# Patient Record
Sex: Male | Born: 1950
Health system: Southern US, Community
[De-identification: ages and names within clinical notes are randomized; demographics above are authoritative.]

## PROBLEM LIST (undated history)

## (undated) DIAGNOSIS — K922 Gastrointestinal hemorrhage, unspecified: Secondary | ICD-10-CM

## (undated) DIAGNOSIS — N185 Chronic kidney disease, stage 5: Secondary | ICD-10-CM

## (undated) DIAGNOSIS — I951 Orthostatic hypotension: Secondary | ICD-10-CM

## (undated) DIAGNOSIS — E119 Type 2 diabetes mellitus without complications: Secondary | ICD-10-CM

## (undated) DIAGNOSIS — Z9289 Personal history of other medical treatment: Secondary | ICD-10-CM

## (undated) DIAGNOSIS — N186 End stage renal disease: Secondary | ICD-10-CM

## (undated) DIAGNOSIS — I480 Paroxysmal atrial fibrillation: Secondary | ICD-10-CM

## (undated) HISTORY — DX: Type 2 diabetes mellitus without complications: E11.9

## (undated) HISTORY — PX: NOSE SURGERY: SHX723

---
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
## 2007-03-03 ENCOUNTER — Ambulatory Visit (HOSPITAL_COMMUNITY): Admission: RE | Admit: 2007-03-03 | Discharge: 2007-03-03 | Payer: Self-pay | Admitting: Family Medicine

## 2010-12-05 ENCOUNTER — Encounter: Payer: Self-pay | Admitting: Cardiology

## 2011-01-02 NOTE — Letter (Signed)
Summary: [facility] INTERNAL MEDICINE  - OFFICE NOTE  [facility] INTERNAL MEDICINE  - OFFICE NOTE   Imported By: Cher Nakai 12/27/2010 12:08:34  _____________________________________________________________________  External Attachment:    Type:   Image     Comment:   External Document

## 2011-01-31 ENCOUNTER — Ambulatory Visit: Payer: Self-pay | Admitting: Cardiology

## 2011-01-31 ENCOUNTER — Encounter: Payer: Self-pay | Admitting: *Deleted

## 2011-02-18 ENCOUNTER — Ambulatory Visit: Payer: Self-pay | Admitting: Cardiology

## 2011-03-17 ENCOUNTER — Ambulatory Visit: Payer: Self-pay | Admitting: Cardiology

## 2015-08-21 DIAGNOSIS — L03031 Cellulitis of right toe: Secondary | ICD-10-CM | POA: Diagnosis not present

## 2015-08-21 DIAGNOSIS — Z6826 Body mass index (BMI) 26.0-26.9, adult: Secondary | ICD-10-CM | POA: Diagnosis not present

## 2015-09-10 DIAGNOSIS — Z23 Encounter for immunization: Secondary | ICD-10-CM | POA: Diagnosis not present

## 2015-11-20 DIAGNOSIS — E1142 Type 2 diabetes mellitus with diabetic polyneuropathy: Secondary | ICD-10-CM | POA: Diagnosis not present

## 2015-11-20 DIAGNOSIS — I10 Essential (primary) hypertension: Secondary | ICD-10-CM | POA: Diagnosis not present

## 2015-11-20 DIAGNOSIS — Z6826 Body mass index (BMI) 26.0-26.9, adult: Secondary | ICD-10-CM | POA: Diagnosis not present

## 2015-11-21 DIAGNOSIS — H52223 Regular astigmatism, bilateral: Secondary | ICD-10-CM | POA: Diagnosis not present

## 2015-11-21 DIAGNOSIS — H5203 Hypermetropia, bilateral: Secondary | ICD-10-CM | POA: Diagnosis not present

## 2015-11-21 DIAGNOSIS — E119 Type 2 diabetes mellitus without complications: Secondary | ICD-10-CM | POA: Diagnosis not present

## 2015-11-21 DIAGNOSIS — Z7984 Long term (current) use of oral hypoglycemic drugs: Secondary | ICD-10-CM | POA: Diagnosis not present

## 2016-01-29 DIAGNOSIS — A5216 Charcot's arthropathy (tabetic): Secondary | ICD-10-CM | POA: Diagnosis not present

## 2016-01-29 DIAGNOSIS — M79671 Pain in right foot: Secondary | ICD-10-CM | POA: Diagnosis not present

## 2016-02-04 DIAGNOSIS — G6181 Chronic inflammatory demyelinating polyneuritis: Secondary | ICD-10-CM | POA: Diagnosis not present

## 2016-02-12 DIAGNOSIS — E1142 Type 2 diabetes mellitus with diabetic polyneuropathy: Secondary | ICD-10-CM | POA: Diagnosis not present

## 2016-02-12 DIAGNOSIS — I10 Essential (primary) hypertension: Secondary | ICD-10-CM | POA: Diagnosis not present

## 2016-02-12 DIAGNOSIS — Z6827 Body mass index (BMI) 27.0-27.9, adult: Secondary | ICD-10-CM | POA: Diagnosis not present

## 2016-03-03 DIAGNOSIS — E11610 Type 2 diabetes mellitus with diabetic neuropathic arthropathy: Secondary | ICD-10-CM | POA: Diagnosis not present

## 2016-03-03 DIAGNOSIS — M79671 Pain in right foot: Secondary | ICD-10-CM | POA: Diagnosis not present

## 2016-05-19 DIAGNOSIS — Z0000 Encounter for general adult medical examination without abnormal findings: Secondary | ICD-10-CM | POA: Diagnosis not present

## 2016-05-19 DIAGNOSIS — Z1211 Encounter for screening for malignant neoplasm of colon: Secondary | ICD-10-CM | POA: Diagnosis not present

## 2016-05-19 DIAGNOSIS — I10 Essential (primary) hypertension: Secondary | ICD-10-CM | POA: Diagnosis not present

## 2016-05-19 DIAGNOSIS — E784 Other hyperlipidemia: Secondary | ICD-10-CM | POA: Diagnosis not present

## 2016-05-19 DIAGNOSIS — E1142 Type 2 diabetes mellitus with diabetic polyneuropathy: Secondary | ICD-10-CM | POA: Diagnosis not present

## 2016-08-22 DIAGNOSIS — E784 Other hyperlipidemia: Secondary | ICD-10-CM | POA: Diagnosis not present

## 2016-08-22 DIAGNOSIS — E1142 Type 2 diabetes mellitus with diabetic polyneuropathy: Secondary | ICD-10-CM | POA: Diagnosis not present

## 2016-08-22 DIAGNOSIS — I10 Essential (primary) hypertension: Secondary | ICD-10-CM | POA: Diagnosis not present

## 2016-11-08 DIAGNOSIS — Z23 Encounter for immunization: Secondary | ICD-10-CM | POA: Diagnosis not present

## 2016-11-21 DIAGNOSIS — Z7984 Long term (current) use of oral hypoglycemic drugs: Secondary | ICD-10-CM | POA: Diagnosis not present

## 2016-11-21 DIAGNOSIS — H25813 Combined forms of age-related cataract, bilateral: Secondary | ICD-10-CM | POA: Diagnosis not present

## 2016-11-21 DIAGNOSIS — H5203 Hypermetropia, bilateral: Secondary | ICD-10-CM | POA: Diagnosis not present

## 2016-11-21 DIAGNOSIS — E119 Type 2 diabetes mellitus without complications: Secondary | ICD-10-CM | POA: Diagnosis not present

## 2016-11-28 DIAGNOSIS — E784 Other hyperlipidemia: Secondary | ICD-10-CM | POA: Diagnosis not present

## 2016-11-28 DIAGNOSIS — E1142 Type 2 diabetes mellitus with diabetic polyneuropathy: Secondary | ICD-10-CM | POA: Diagnosis not present

## 2016-11-28 DIAGNOSIS — I10 Essential (primary) hypertension: Secondary | ICD-10-CM | POA: Diagnosis not present

## 2016-11-28 DIAGNOSIS — Z125 Encounter for screening for malignant neoplasm of prostate: Secondary | ICD-10-CM | POA: Diagnosis not present

## 2017-03-02 DIAGNOSIS — I10 Essential (primary) hypertension: Secondary | ICD-10-CM | POA: Diagnosis not present

## 2017-03-02 DIAGNOSIS — N183 Chronic kidney disease, stage 3 (moderate): Secondary | ICD-10-CM | POA: Diagnosis not present

## 2017-03-02 DIAGNOSIS — E784 Other hyperlipidemia: Secondary | ICD-10-CM | POA: Diagnosis not present

## 2017-03-02 DIAGNOSIS — E1142 Type 2 diabetes mellitus with diabetic polyneuropathy: Secondary | ICD-10-CM | POA: Diagnosis not present

## 2017-03-25 DIAGNOSIS — G6181 Chronic inflammatory demyelinating polyneuritis: Secondary | ICD-10-CM | POA: Diagnosis not present

## 2017-04-28 DIAGNOSIS — A681 Tick-borne relapsing fever: Secondary | ICD-10-CM | POA: Diagnosis not present

## 2017-06-04 DIAGNOSIS — E119 Type 2 diabetes mellitus without complications: Secondary | ICD-10-CM | POA: Diagnosis not present

## 2017-06-04 DIAGNOSIS — E1142 Type 2 diabetes mellitus with diabetic polyneuropathy: Secondary | ICD-10-CM | POA: Diagnosis not present

## 2017-06-04 DIAGNOSIS — E784 Other hyperlipidemia: Secondary | ICD-10-CM | POA: Diagnosis not present

## 2017-06-04 DIAGNOSIS — I10 Essential (primary) hypertension: Secondary | ICD-10-CM | POA: Diagnosis not present

## 2017-09-15 DIAGNOSIS — E7849 Other hyperlipidemia: Secondary | ICD-10-CM | POA: Diagnosis not present

## 2017-09-15 DIAGNOSIS — Z0000 Encounter for general adult medical examination without abnormal findings: Secondary | ICD-10-CM | POA: Diagnosis not present

## 2017-09-15 DIAGNOSIS — I10 Essential (primary) hypertension: Secondary | ICD-10-CM | POA: Diagnosis not present

## 2017-09-15 DIAGNOSIS — E119 Type 2 diabetes mellitus without complications: Secondary | ICD-10-CM | POA: Diagnosis not present

## 2017-09-21 DIAGNOSIS — Z23 Encounter for immunization: Secondary | ICD-10-CM | POA: Diagnosis not present

## 2017-10-27 DIAGNOSIS — Z538 Procedure and treatment not carried out for other reasons: Secondary | ICD-10-CM | POA: Diagnosis not present

## 2017-10-27 DIAGNOSIS — E119 Type 2 diabetes mellitus without complications: Secondary | ICD-10-CM | POA: Diagnosis not present

## 2017-10-27 DIAGNOSIS — Z1211 Encounter for screening for malignant neoplasm of colon: Secondary | ICD-10-CM | POA: Diagnosis not present

## 2017-10-28 DIAGNOSIS — E119 Type 2 diabetes mellitus without complications: Secondary | ICD-10-CM | POA: Diagnosis not present

## 2017-10-28 DIAGNOSIS — Z1211 Encounter for screening for malignant neoplasm of colon: Secondary | ICD-10-CM | POA: Diagnosis not present

## 2017-11-28 DIAGNOSIS — E119 Type 2 diabetes mellitus without complications: Secondary | ICD-10-CM | POA: Diagnosis not present

## 2017-11-28 DIAGNOSIS — Z7984 Long term (current) use of oral hypoglycemic drugs: Secondary | ICD-10-CM | POA: Diagnosis not present

## 2017-11-28 DIAGNOSIS — H5203 Hypermetropia, bilateral: Secondary | ICD-10-CM | POA: Diagnosis not present

## 2017-11-28 DIAGNOSIS — H25813 Combined forms of age-related cataract, bilateral: Secondary | ICD-10-CM | POA: Diagnosis not present

## 2017-12-21 DIAGNOSIS — E7849 Other hyperlipidemia: Secondary | ICD-10-CM | POA: Diagnosis not present

## 2017-12-21 DIAGNOSIS — I10 Essential (primary) hypertension: Secondary | ICD-10-CM | POA: Diagnosis not present

## 2017-12-21 DIAGNOSIS — E1165 Type 2 diabetes mellitus with hyperglycemia: Secondary | ICD-10-CM | POA: Diagnosis not present

## 2018-04-06 DIAGNOSIS — I10 Essential (primary) hypertension: Secondary | ICD-10-CM | POA: Diagnosis not present

## 2018-04-06 DIAGNOSIS — E1165 Type 2 diabetes mellitus with hyperglycemia: Secondary | ICD-10-CM | POA: Diagnosis not present

## 2018-04-06 DIAGNOSIS — N182 Chronic kidney disease, stage 2 (mild): Secondary | ICD-10-CM | POA: Diagnosis not present

## 2018-04-06 DIAGNOSIS — E7849 Other hyperlipidemia: Secondary | ICD-10-CM | POA: Diagnosis not present

## 2018-04-06 DIAGNOSIS — Z125 Encounter for screening for malignant neoplasm of prostate: Secondary | ICD-10-CM | POA: Diagnosis not present

## 2018-04-17 DIAGNOSIS — Z79899 Other long term (current) drug therapy: Secondary | ICD-10-CM | POA: Diagnosis not present

## 2018-04-17 DIAGNOSIS — E119 Type 2 diabetes mellitus without complications: Secondary | ICD-10-CM | POA: Diagnosis not present

## 2018-04-17 DIAGNOSIS — S61412A Laceration without foreign body of left hand, initial encounter: Secondary | ICD-10-CM | POA: Diagnosis not present

## 2018-04-17 DIAGNOSIS — W260XXA Contact with knife, initial encounter: Secondary | ICD-10-CM | POA: Diagnosis not present

## 2018-04-17 DIAGNOSIS — Z7984 Long term (current) use of oral hypoglycemic drugs: Secondary | ICD-10-CM | POA: Diagnosis not present

## 2018-06-08 ENCOUNTER — Encounter (HOSPITAL_COMMUNITY)
Admission: EM | Disposition: A | Discharge status: Home Health | Payer: Self-pay | Source: Home / Self Care | Attending: Family Medicine

## 2018-06-08 ENCOUNTER — Inpatient Hospital Stay (HOSPITAL_COMMUNITY): Payer: Medicare Other

## 2018-06-08 ENCOUNTER — Encounter (HOSPITAL_COMMUNITY): Payer: Self-pay

## 2018-06-08 ENCOUNTER — Other Ambulatory Visit: Payer: Self-pay

## 2018-06-08 ENCOUNTER — Inpatient Hospital Stay (HOSPITAL_COMMUNITY): Payer: Medicare Other | Admitting: Certified Registered"

## 2018-06-08 ENCOUNTER — Inpatient Hospital Stay (HOSPITAL_COMMUNITY)
Admission: EM | Admit: 2018-06-08 | Discharge: 2018-06-15 | DRG: 493 | Disposition: A | Discharge status: Home Health | Payer: Medicare Other | Attending: Family Medicine | Admitting: Family Medicine

## 2018-06-08 ENCOUNTER — Emergency Department (HOSPITAL_COMMUNITY): Payer: Medicare Other

## 2018-06-08 DIAGNOSIS — E860 Dehydration: Secondary | ICD-10-CM | POA: Diagnosis not present

## 2018-06-08 DIAGNOSIS — N189 Chronic kidney disease, unspecified: Secondary | ICD-10-CM | POA: Diagnosis present

## 2018-06-08 DIAGNOSIS — I129 Hypertensive chronic kidney disease with stage 1 through stage 4 chronic kidney disease, or unspecified chronic kidney disease: Secondary | ICD-10-CM | POA: Diagnosis present

## 2018-06-08 DIAGNOSIS — M21371 Foot drop, right foot: Secondary | ICD-10-CM | POA: Diagnosis present

## 2018-06-08 DIAGNOSIS — E785 Hyperlipidemia, unspecified: Secondary | ICD-10-CM | POA: Diagnosis present

## 2018-06-08 DIAGNOSIS — T148XXA Other injury of unspecified body region, initial encounter: Secondary | ICD-10-CM

## 2018-06-08 DIAGNOSIS — G6181 Chronic inflammatory demyelinating polyneuritis: Secondary | ICD-10-CM | POA: Diagnosis present

## 2018-06-08 DIAGNOSIS — K56600 Partial intestinal obstruction, unspecified as to cause: Secondary | ICD-10-CM | POA: Diagnosis not present

## 2018-06-08 DIAGNOSIS — S8291XB Unspecified fracture of right lower leg, initial encounter for open fracture type I or II: Secondary | ICD-10-CM | POA: Diagnosis not present

## 2018-06-08 DIAGNOSIS — K56609 Unspecified intestinal obstruction, unspecified as to partial versus complete obstruction: Secondary | ICD-10-CM

## 2018-06-08 DIAGNOSIS — E872 Acidosis: Secondary | ICD-10-CM | POA: Diagnosis present

## 2018-06-08 DIAGNOSIS — E119 Type 2 diabetes mellitus without complications: Secondary | ICD-10-CM

## 2018-06-08 DIAGNOSIS — D62 Acute posthemorrhagic anemia: Secondary | ICD-10-CM | POA: Diagnosis not present

## 2018-06-08 DIAGNOSIS — S8411XS Injury of peroneal nerve at lower leg level, right leg, sequela: Secondary | ICD-10-CM

## 2018-06-08 DIAGNOSIS — Y92008 Other place in unspecified non-institutional (private) residence as the place of occurrence of the external cause: Secondary | ICD-10-CM | POA: Diagnosis not present

## 2018-06-08 DIAGNOSIS — K5900 Constipation, unspecified: Secondary | ICD-10-CM | POA: Diagnosis not present

## 2018-06-08 DIAGNOSIS — D631 Anemia in chronic kidney disease: Secondary | ICD-10-CM | POA: Diagnosis present

## 2018-06-08 DIAGNOSIS — Z79899 Other long term (current) drug therapy: Secondary | ICD-10-CM

## 2018-06-08 DIAGNOSIS — Y9389 Activity, other specified: Secondary | ICD-10-CM | POA: Diagnosis not present

## 2018-06-08 DIAGNOSIS — E875 Hyperkalemia: Secondary | ICD-10-CM | POA: Diagnosis not present

## 2018-06-08 DIAGNOSIS — S8251XC Displaced fracture of medial malleolus of right tibia, initial encounter for open fracture type IIIA, IIIB, or IIIC: Secondary | ICD-10-CM | POA: Diagnosis not present

## 2018-06-08 DIAGNOSIS — S82891B Other fracture of right lower leg, initial encounter for open fracture type I or II: Secondary | ICD-10-CM | POA: Diagnosis present

## 2018-06-08 DIAGNOSIS — S82841C Displaced bimalleolar fracture of right lower leg, initial encounter for open fracture type IIIA, IIIB, or IIIC: Principal | ICD-10-CM | POA: Diagnosis present

## 2018-06-08 DIAGNOSIS — S82391A Other fracture of lower end of right tibia, initial encounter for closed fracture: Secondary | ICD-10-CM | POA: Diagnosis not present

## 2018-06-08 DIAGNOSIS — M25571 Pain in right ankle and joints of right foot: Secondary | ICD-10-CM | POA: Diagnosis not present

## 2018-06-08 DIAGNOSIS — Z7984 Long term (current) use of oral hypoglycemic drugs: Secondary | ICD-10-CM

## 2018-06-08 DIAGNOSIS — Z419 Encounter for procedure for purposes other than remedying health state, unspecified: Secondary | ICD-10-CM

## 2018-06-08 DIAGNOSIS — N179 Acute kidney failure, unspecified: Secondary | ICD-10-CM | POA: Diagnosis not present

## 2018-06-08 DIAGNOSIS — S8253XC Displaced fracture of medial malleolus of unspecified tibia, initial encounter for open fracture type IIIA, IIIB, or IIIC: Secondary | ICD-10-CM | POA: Diagnosis present

## 2018-06-08 DIAGNOSIS — S93431A Sprain of tibiofibular ligament of right ankle, initial encounter: Secondary | ICD-10-CM

## 2018-06-08 DIAGNOSIS — S82843B Displaced bimalleolar fracture of unspecified lower leg, initial encounter for open fracture type I or II: Secondary | ICD-10-CM

## 2018-06-08 DIAGNOSIS — S82451A Displaced comminuted fracture of shaft of right fibula, initial encounter for closed fracture: Secondary | ICD-10-CM | POA: Diagnosis present

## 2018-06-08 DIAGNOSIS — W11XXXA Fall on and from ladder, initial encounter: Secondary | ICD-10-CM | POA: Diagnosis present

## 2018-06-08 DIAGNOSIS — I10 Essential (primary) hypertension: Secondary | ICD-10-CM | POA: Diagnosis not present

## 2018-06-08 DIAGNOSIS — R1110 Vomiting, unspecified: Secondary | ICD-10-CM

## 2018-06-08 DIAGNOSIS — K5669 Other partial intestinal obstruction: Secondary | ICD-10-CM | POA: Diagnosis not present

## 2018-06-08 DIAGNOSIS — S82831A Other fracture of upper and lower end of right fibula, initial encounter for closed fracture: Secondary | ICD-10-CM | POA: Diagnosis not present

## 2018-06-08 DIAGNOSIS — S82891C Other fracture of right lower leg, initial encounter for open fracture type IIIA, IIIB, or IIIC: Secondary | ICD-10-CM | POA: Diagnosis not present

## 2018-06-08 DIAGNOSIS — S82391B Other fracture of lower end of right tibia, initial encounter for open fracture type I or II: Secondary | ICD-10-CM | POA: Diagnosis not present

## 2018-06-08 DIAGNOSIS — S8251XB Displaced fracture of medial malleolus of right tibia, initial encounter for open fracture type I or II: Secondary | ICD-10-CM | POA: Diagnosis not present

## 2018-06-08 DIAGNOSIS — S82899A Other fracture of unspecified lower leg, initial encounter for closed fracture: Secondary | ICD-10-CM | POA: Diagnosis present

## 2018-06-08 DIAGNOSIS — Z978 Presence of other specified devices: Secondary | ICD-10-CM

## 2018-06-08 DIAGNOSIS — E1122 Type 2 diabetes mellitus with diabetic chronic kidney disease: Secondary | ICD-10-CM | POA: Diagnosis present

## 2018-06-08 DIAGNOSIS — G629 Polyneuropathy, unspecified: Secondary | ICD-10-CM

## 2018-06-08 DIAGNOSIS — Z4682 Encounter for fitting and adjustment of non-vascular catheter: Secondary | ICD-10-CM | POA: Diagnosis not present

## 2018-06-08 HISTORY — DX: Type 2 diabetes mellitus without complications: E11.9

## 2018-06-08 HISTORY — PX: I & D EXTREMITY: SHX5045

## 2018-06-08 HISTORY — PX: TIBIA DEBRIDEMENT: SHX2514

## 2018-06-08 HISTORY — PX: ORIF ANKLE FRACTURE: SHX5408

## 2018-06-08 HISTORY — PX: EXTERNAL FIXATION LEG: SHX1549

## 2018-06-08 LAB — CBC
HCT: 28.8 % — ABNORMAL LOW (ref 39.0–52.0)
Hemoglobin: 9 g/dL — ABNORMAL LOW (ref 13.0–17.0)
MCH: 30.8 pg (ref 26.0–34.0)
MCHC: 31.3 g/dL (ref 30.0–36.0)
MCV: 98.6 fL (ref 78.0–100.0)
Platelets: 195 10*3/uL (ref 150–400)
RBC: 2.92 MIL/uL — ABNORMAL LOW (ref 4.22–5.81)
RDW: 12.7 % (ref 11.5–15.5)
WBC: 5 10*3/uL (ref 4.0–10.5)

## 2018-06-08 LAB — COMPREHENSIVE METABOLIC PANEL
ALT: 18 U/L (ref 0–44)
AST: 21 U/L (ref 15–41)
Albumin: 3.6 g/dL (ref 3.5–5.0)
Alkaline Phosphatase: 54 U/L (ref 38–126)
Anion gap: 12 (ref 5–15)
BUN: 51 mg/dL — ABNORMAL HIGH (ref 8–23)
CO2: 17 mmol/L — ABNORMAL LOW (ref 22–32)
Calcium: 8.2 mg/dL — ABNORMAL LOW (ref 8.9–10.3)
Chloride: 110 mmol/L (ref 98–111)
Creatinine, Ser: 4.44 mg/dL — ABNORMAL HIGH (ref 0.61–1.24)
GFR calc Af Amer: 14 mL/min — ABNORMAL LOW (ref >60)
GFR calc non Af Amer: 13 mL/min — ABNORMAL LOW (ref >60)
Glucose, Bld: 111 mg/dL — ABNORMAL HIGH (ref 70–99)
Potassium: 4.7 mmol/L (ref 3.5–5.1)
Sodium: 139 mmol/L (ref 135–145)
Total Bilirubin: 0.6 mg/dL (ref 0.3–1.2)
Total Protein: 6 g/dL — ABNORMAL LOW (ref 6.5–8.1)

## 2018-06-08 LAB — I-STAT CG4 LACTIC ACID, ED: Lactic Acid, Venous: 2.5 mmol/L (ref 0.5–1.9)

## 2018-06-08 LAB — I-STAT CHEM 8, ED
BUN: 49 mg/dL — ABNORMAL HIGH (ref 8–23)
Calcium, Ion: 1.07 mmol/L — ABNORMAL LOW (ref 1.15–1.40)
Chloride: 108 mmol/L (ref 98–111)
Creatinine, Ser: 4.6 mg/dL — ABNORMAL HIGH (ref 0.61–1.24)
Glucose, Bld: 112 mg/dL — ABNORMAL HIGH (ref 70–99)
HCT: 27 % — ABNORMAL LOW (ref 39.0–52.0)
Hemoglobin: 9.2 g/dL — ABNORMAL LOW (ref 13.0–17.0)
Potassium: 4.6 mmol/L (ref 3.5–5.1)
Sodium: 140 mmol/L (ref 135–145)
TCO2: 18 mmol/L — ABNORMAL LOW (ref 22–32)

## 2018-06-08 LAB — URINALYSIS, ROUTINE W REFLEX MICROSCOPIC
Bilirubin Urine: NEGATIVE
Glucose, UA: NEGATIVE mg/dL
Ketones, ur: NEGATIVE mg/dL
Nitrite: NEGATIVE
Protein, ur: NEGATIVE mg/dL
Specific Gravity, Urine: 1.01 (ref 1.005–1.030)
pH: 5 (ref 5.0–8.0)

## 2018-06-08 LAB — CDS SEROLOGY

## 2018-06-08 LAB — PROTIME-INR
INR: 1.01
Prothrombin Time: 13.3 seconds (ref 11.4–15.2)

## 2018-06-08 LAB — SAMPLE TO BLOOD BANK

## 2018-06-08 LAB — ETHANOL: Alcohol, Ethyl (B): <10 mg/dL (ref <10)

## 2018-06-08 LAB — GLUCOSE, CAPILLARY
Glucose-Capillary: 97 mg/dL (ref 70–99)
Glucose-Capillary: 132 mg/dL — ABNORMAL HIGH (ref 70–99)

## 2018-06-08 LAB — CK: Total CK: 356 U/L (ref 49–397)

## 2018-06-08 LAB — LACTIC ACID, PLASMA: Lactic Acid, Venous: 1.4 mmol/L (ref 0.5–1.9)

## 2018-06-08 SURGERY — IRRIGATION AND DEBRIDEMENT EXTREMITY
Anesthesia: General | Site: Ankle | Laterality: Right

## 2018-06-08 MED ORDER — ACETAMINOPHEN 325 MG PO TABS
650.0000 mg | ORAL_TABLET | Freq: Four times a day (QID) | ORAL | Status: DC | PRN
  Administered 2018-06-09 – 2018-06-15 (×5): 650 mg via ORAL
  Filled 2018-06-08 (×5): qty 2

## 2018-06-08 MED ORDER — CEFAZOLIN SODIUM-DEXTROSE 2-4 GM/100ML-% IV SOLN
INTRAVENOUS | Status: AC
  Filled 2018-06-08: qty 100

## 2018-06-08 MED ORDER — SODIUM CHLORIDE 0.9 % IV SOLN
INTRAVENOUS | Status: DC
  Administered 2018-06-08 – 2018-06-10 (×3): via INTRAVENOUS

## 2018-06-08 MED ORDER — HYDROMORPHONE HCL 1 MG/ML IJ SOLN
INTRAMUSCULAR | Status: AC
  Administered 2018-06-08: 1 mg
  Filled 2018-06-08: qty 1

## 2018-06-08 MED ORDER — POLYETHYLENE GLYCOL 3350 17 G PO PACK: 17.0000 g | PACK | Freq: Every day | ORAL | Status: DC | PRN

## 2018-06-08 MED ORDER — METHOCARBAMOL 1000 MG/10ML IJ SOLN
500.0000 mg | Freq: Four times a day (QID) | INTRAVENOUS | Status: DC | PRN
  Filled 2018-06-08: qty 5

## 2018-06-08 MED ORDER — METOCLOPRAMIDE HCL 5 MG/ML IJ SOLN
5.0000 mg | Freq: Three times a day (TID) | INTRAMUSCULAR | Status: DC | PRN
  Administered 2018-06-12: 10 mg via INTRAVENOUS
  Filled 2018-06-08: qty 2

## 2018-06-08 MED ORDER — OXYCODONE HCL 5 MG/5ML PO SOLN: 5.0000 mg | Freq: Once | ORAL | Status: DC | PRN

## 2018-06-08 MED ORDER — DEXAMETHASONE SODIUM PHOSPHATE 10 MG/ML IJ SOLN
INTRAMUSCULAR | Status: DC | PRN
  Administered 2018-06-08: 10 mg via INTRAVENOUS

## 2018-06-08 MED ORDER — LIDOCAINE 2% (20 MG/ML) 5 ML SYRINGE
INTRAMUSCULAR | Status: AC
  Filled 2018-06-08: qty 5

## 2018-06-08 MED ORDER — MIDAZOLAM HCL 2 MG/2ML IJ SOLN
INTRAMUSCULAR | Status: DC | PRN
  Administered 2018-06-08: 2 mg via INTRAVENOUS

## 2018-06-08 MED ORDER — SODIUM CHLORIDE 0.9 % IV BOLUS
1000.0000 mL | Freq: Once | INTRAVENOUS | Status: AC
  Administered 2018-06-08: 1000 mL via INTRAVENOUS

## 2018-06-08 MED ORDER — ONDANSETRON HCL 4 MG/2ML IJ SOLN: 4.0000 mg | Freq: Once | INTRAMUSCULAR | Status: DC | PRN

## 2018-06-08 MED ORDER — SODIUM CHLORIDE 0.9 % IR SOLN
Status: DC | PRN
  Administered 2018-06-08: 3000 mL

## 2018-06-08 MED ORDER — ONDANSETRON HCL 4 MG/2ML IJ SOLN: 4.0000 mg | Freq: Four times a day (QID) | INTRAMUSCULAR | Status: DC | PRN

## 2018-06-08 MED ORDER — CALCIUM CARBONATE ANTACID 500 MG PO CHEW
400.0000 mg | CHEWABLE_TABLET | Freq: Two times a day (BID) | ORAL | Status: DC
  Administered 2018-06-08 – 2018-06-12 (×7): 400 mg via ORAL
  Administered 2018-06-12: 200 mg via ORAL
  Administered 2018-06-13 – 2018-06-15 (×4): 400 mg via ORAL
  Filled 2018-06-08 (×13): qty 2

## 2018-06-08 MED ORDER — ONDANSETRON HCL 4 MG PO TABS: 4.0000 mg | ORAL_TABLET | Freq: Four times a day (QID) | ORAL | Status: DC | PRN

## 2018-06-08 MED ORDER — MORPHINE SULFATE (PF) 4 MG/ML IV SOLN: 2.0000 mg | INTRAVENOUS | Status: DC | PRN

## 2018-06-08 MED ORDER — ROCURONIUM BROMIDE 10 MG/ML (PF) SYRINGE
PREFILLED_SYRINGE | INTRAVENOUS | Status: AC
  Filled 2018-06-08: qty 10

## 2018-06-08 MED ORDER — SUCCINYLCHOLINE CHLORIDE 200 MG/10ML IV SOSY
PREFILLED_SYRINGE | INTRAVENOUS | Status: AC
  Filled 2018-06-08: qty 10

## 2018-06-08 MED ORDER — SODIUM CHLORIDE 0.9 % IV SOLN
INTRAVENOUS | Status: DC
  Administered 2018-06-08: 23:00:00 via INTRAVENOUS

## 2018-06-08 MED ORDER — POVIDONE-IODINE 10 % EX SWAB: 2.0000 "application " | Freq: Once | CUTANEOUS | Status: DC

## 2018-06-08 MED ORDER — ENOXAPARIN SODIUM 30 MG/0.3ML ~~LOC~~ SOLN
30.0000 mg | SUBCUTANEOUS | Status: DC
  Administered 2018-06-09 – 2018-06-10 (×2): 30 mg via SUBCUTANEOUS
  Filled 2018-06-08 (×2): qty 0.3

## 2018-06-08 MED ORDER — PROPOFOL 10 MG/ML IV BOLUS: 0.5000 mg/kg | Freq: Once | INTRAVENOUS | Status: DC

## 2018-06-08 MED ORDER — PHENYLEPHRINE HCL 10 MG/ML IJ SOLN: INTRAMUSCULAR | Status: DC | PRN

## 2018-06-08 MED ORDER — PROPOFOL 10 MG/ML IV BOLUS
INTRAVENOUS | Status: DC | PRN
  Administered 2018-06-08: 160 mg via INTRAVENOUS

## 2018-06-08 MED ORDER — INSULIN ASPART 100 UNIT/ML ~~LOC~~ SOLN
0.0000 [IU] | Freq: Three times a day (TID) | SUBCUTANEOUS | Status: DC
  Administered 2018-06-09: 1 [IU] via SUBCUTANEOUS
  Administered 2018-06-09 – 2018-06-11 (×2): 2 [IU] via SUBCUTANEOUS
  Administered 2018-06-11: 1 [IU] via SUBCUTANEOUS

## 2018-06-08 MED ORDER — SODIUM BICARBONATE 650 MG PO TABS
650.0000 mg | ORAL_TABLET | Freq: Two times a day (BID) | ORAL | Status: DC
  Administered 2018-06-08 – 2018-06-15 (×11): 650 mg via ORAL
  Filled 2018-06-08 (×12): qty 1

## 2018-06-08 MED ORDER — PHENYLEPHRINE 40 MCG/ML (10ML) SYRINGE FOR IV PUSH (FOR BLOOD PRESSURE SUPPORT)
PREFILLED_SYRINGE | INTRAVENOUS | Status: AC
  Filled 2018-06-08: qty 10

## 2018-06-08 MED ORDER — CHLORHEXIDINE GLUCONATE 4 % EX LIQD: 60.0000 mL | Freq: Once | CUTANEOUS | Status: DC

## 2018-06-08 MED ORDER — FENTANYL CITRATE (PF) 250 MCG/5ML IJ SOLN
INTRAMUSCULAR | Status: AC
  Filled 2018-06-08: qty 5

## 2018-06-08 MED ORDER — FENTANYL CITRATE (PF) 100 MCG/2ML IJ SOLN
25.0000 ug | INTRAMUSCULAR | Status: DC | PRN
  Administered 2018-06-08: 50 ug via INTRAVENOUS

## 2018-06-08 MED ORDER — ONDANSETRON HCL 4 MG/2ML IJ SOLN
4.0000 mg | Freq: Four times a day (QID) | INTRAMUSCULAR | Status: DC | PRN
  Administered 2018-06-08 – 2018-06-12 (×3): 4 mg via INTRAVENOUS
  Filled 2018-06-08 (×2): qty 2

## 2018-06-08 MED ORDER — ROCURONIUM BROMIDE 10 MG/ML (PF) SYRINGE
PREFILLED_SYRINGE | INTRAVENOUS | Status: DC | PRN
  Administered 2018-06-08: 60 mg via INTRAVENOUS
  Administered 2018-06-08: 10 mg via INTRAVENOUS

## 2018-06-08 MED ORDER — LIDOCAINE 2% (20 MG/ML) 5 ML SYRINGE
INTRAMUSCULAR | Status: DC | PRN
  Administered 2018-06-08: 60 mg via INTRAVENOUS

## 2018-06-08 MED ORDER — CEFAZOLIN SODIUM-DEXTROSE 2-4 GM/100ML-% IV SOLN: 2.0000 g | INTRAVENOUS | Status: DC

## 2018-06-08 MED ORDER — OXYCODONE HCL 5 MG PO TABS
5.0000 mg | ORAL_TABLET | ORAL | Status: DC | PRN
  Administered 2018-06-08 – 2018-06-10 (×6): 10 mg via ORAL
  Administered 2018-06-10: 5 mg via ORAL
  Filled 2018-06-08 (×6): qty 2
  Filled 2018-06-08: qty 1

## 2018-06-08 MED ORDER — DEXTROSE 5 % IV SOLN
INTRAVENOUS | Status: DC | PRN
  Administered 2018-06-08: 40 ug/min via INTRAVENOUS

## 2018-06-08 MED ORDER — INSULIN ASPART 100 UNIT/ML ~~LOC~~ SOLN: 0.0000 [IU] | Freq: Every day | SUBCUTANEOUS | Status: DC

## 2018-06-08 MED ORDER — PROPOFOL 10 MG/ML IV BOLUS
INTRAVENOUS | Status: AC
  Filled 2018-06-08: qty 20

## 2018-06-08 MED ORDER — SIMVASTATIN 20 MG PO TABS
20.0000 mg | ORAL_TABLET | Freq: Every day | ORAL | Status: DC
  Administered 2018-06-09 – 2018-06-14 (×5): 20 mg via ORAL
  Filled 2018-06-08 (×5): qty 1

## 2018-06-08 MED ORDER — SUGAMMADEX SODIUM 200 MG/2ML IV SOLN
INTRAVENOUS | Status: DC | PRN
  Administered 2018-06-08: 200 mg via INTRAVENOUS

## 2018-06-08 MED ORDER — CEFAZOLIN SODIUM-DEXTROSE 2-4 GM/100ML-% IV SOLN
2.0000 g | Freq: Two times a day (BID) | INTRAVENOUS | Status: DC
  Filled 2018-06-08: qty 100

## 2018-06-08 MED ORDER — MIDAZOLAM HCL 2 MG/2ML IJ SOLN
INTRAMUSCULAR | Status: AC
  Filled 2018-06-08: qty 2

## 2018-06-08 MED ORDER — METOCLOPRAMIDE HCL 5 MG PO TABS: 5.0000 mg | ORAL_TABLET | Freq: Three times a day (TID) | ORAL | Status: DC | PRN

## 2018-06-08 MED ORDER — PROPOFOL 10 MG/ML IV BOLUS
INTRAVENOUS | Status: AC | PRN
  Administered 2018-06-08: 90 mg via INTRAVENOUS

## 2018-06-08 MED ORDER — SUGAMMADEX SODIUM 200 MG/2ML IV SOLN
INTRAVENOUS | Status: AC
Start: 2018-06-08 — End: ?
  Filled 2018-06-08: qty 2

## 2018-06-08 MED ORDER — OXYCODONE HCL 5 MG PO TABS: 5.0000 mg | ORAL_TABLET | Freq: Once | ORAL | Status: DC | PRN

## 2018-06-08 MED ORDER — PHENYLEPHRINE 40 MCG/ML (10ML) SYRINGE FOR IV PUSH (FOR BLOOD PRESSURE SUPPORT)
PREFILLED_SYRINGE | INTRAVENOUS | Status: DC | PRN
  Administered 2018-06-08 (×3): 80 ug via INTRAVENOUS

## 2018-06-08 MED ORDER — SUCCINYLCHOLINE CHLORIDE 200 MG/10ML IV SOSY
PREFILLED_SYRINGE | INTRAVENOUS | Status: DC | PRN
  Administered 2018-06-08: 160 mg via INTRAVENOUS

## 2018-06-08 MED ORDER — ACETAMINOPHEN 650 MG RE SUPP: 650.0000 mg | Freq: Four times a day (QID) | RECTAL | Status: DC | PRN

## 2018-06-08 MED ORDER — FENTANYL CITRATE (PF) 100 MCG/2ML IJ SOLN
INTRAMUSCULAR | Status: AC
  Administered 2018-06-08: 50 ug via INTRAVENOUS
  Filled 2018-06-08: qty 2

## 2018-06-08 MED ORDER — BISACODYL 10 MG RE SUPP: 10.0000 mg | Freq: Every day | RECTAL | Status: DC | PRN

## 2018-06-08 MED ORDER — CEFAZOLIN SODIUM-DEXTROSE 2-4 GM/100ML-% IV SOLN
2.0000 g | Freq: Once | INTRAVENOUS | Status: AC
  Administered 2018-06-08: 2 g via INTRAVENOUS

## 2018-06-08 MED ORDER — FENTANYL CITRATE (PF) 250 MCG/5ML IJ SOLN
INTRAMUSCULAR | Status: DC | PRN
  Administered 2018-06-08: 100 ug via INTRAVENOUS

## 2018-06-08 MED ORDER — DEXAMETHASONE SODIUM PHOSPHATE 10 MG/ML IJ SOLN
INTRAMUSCULAR | Status: AC
  Filled 2018-06-08: qty 1

## 2018-06-08 MED ORDER — METHOCARBAMOL 500 MG PO TABS
500.0000 mg | ORAL_TABLET | Freq: Four times a day (QID) | ORAL | Status: DC | PRN
  Administered 2018-06-08 – 2018-06-10 (×4): 500 mg via ORAL
  Filled 2018-06-08 (×6): qty 1

## 2018-06-08 MED ORDER — DOCUSATE SODIUM 100 MG PO CAPS
100.0000 mg | ORAL_CAPSULE | Freq: Two times a day (BID) | ORAL | Status: DC
  Administered 2018-06-08 – 2018-06-12 (×7): 100 mg via ORAL
  Filled 2018-06-08 (×8): qty 1

## 2018-06-08 SURGICAL SUPPLY — 77 items
BANDAGE ACE 4X5 VEL STRL LF (GAUZE/BANDAGES/DRESSINGS) ×3 IMPLANT
BANDAGE ACE 6X5 VEL STRL LF (GAUZE/BANDAGES/DRESSINGS) ×3 IMPLANT
BANDAGE ESMARK 6X9 LF (GAUZE/BANDAGES/DRESSINGS) ×2 IMPLANT
BAR GLASS FIBER EXFX 11X300 (EXFIX) ×6 IMPLANT
BIT DRILL 2.9 CANN QC NONSTRL (BIT) ×3 IMPLANT
BIT DRILL CANN MED FLUTE 4.0 (BIT) ×2 IMPLANT
BNDG COHESIVE 4X5 TAN STRL (GAUZE/BANDAGES/DRESSINGS) ×3 IMPLANT
BNDG ESMARK 6X9 LF (GAUZE/BANDAGES/DRESSINGS) ×3
BNDG GAUZE ELAST 4 BULKY (GAUZE/BANDAGES/DRESSINGS) ×3 IMPLANT
BRUSH SCRUB SURG 4.25 DISP (MISCELLANEOUS) ×3 IMPLANT
CLAMP BAR TO PIN (EXFIX) ×6 IMPLANT
CLAMP MULTI-PIN 2-BAR 75MM (EXFIX) ×3 IMPLANT
COVER SURGICAL LIGHT HANDLE (MISCELLANEOUS) ×3 IMPLANT
CUFF TOURNIQUET SINGLE 18IN (TOURNIQUET CUFF) ×3 IMPLANT
CUFF TOURNIQUET SINGLE 24IN (TOURNIQUET CUFF) IMPLANT
CUFF TOURNIQUET SINGLE 34IN LL (TOURNIQUET CUFF) IMPLANT
CUFF TOURNIQUET SINGLE 44IN (TOURNIQUET CUFF) IMPLANT
DECANTER SPIKE VIAL GLASS SM (MISCELLANEOUS) ×3 IMPLANT
DRAPE C-ARMOR (DRAPES) ×3 IMPLANT
DRAPE IMP U-DRAPE 54X76 (DRAPES) ×3 IMPLANT
DRAPE OEC MINIVIEW 54X84 (DRAPES) IMPLANT
DRAPE U-SHAPE 47X51 STRL (DRAPES) ×3 IMPLANT
DRILL CANN 4.0MM (BIT) ×3
DRSG ADAPTIC 3X8 NADH LF (GAUZE/BANDAGES/DRESSINGS) ×3 IMPLANT
DRSG PAD ABDOMINAL 8X10 ST (GAUZE/BANDAGES/DRESSINGS) ×6 IMPLANT
DURAPREP 26ML APPLICATOR (WOUND CARE) ×3 IMPLANT
ELECT REM PT RETURN 9FT ADLT (ELECTROSURGICAL) ×3
ELECTRODE REM PT RTRN 9FT ADLT (ELECTROSURGICAL) ×2 IMPLANT
FACESHIELD WRAPAROUND (MASK) ×3 IMPLANT
GAUZE SPONGE 4X4 12PLY STRL (GAUZE/BANDAGES/DRESSINGS) ×3 IMPLANT
GAUZE XEROFORM 5X9 LF (GAUZE/BANDAGES/DRESSINGS) ×3 IMPLANT
GLOVE BIOGEL PI ORTHO PRO 7.5 (GLOVE) ×1
GLOVE BIOGEL PI ORTHO PRO SZ8 (GLOVE) ×1
GLOVE ORTHO TXT STRL SZ7.5 (GLOVE) ×3 IMPLANT
GLOVE PI ORTHO PRO STRL 7.5 (GLOVE) ×2 IMPLANT
GLOVE PI ORTHO PRO STRL SZ8 (GLOVE) ×2 IMPLANT
GLOVE SURG ORTHO 8.5 STRL (GLOVE) ×3 IMPLANT
GOWN STRL REUS W/ TWL LRG LVL3 (GOWN DISPOSABLE) ×6 IMPLANT
GOWN STRL REUS W/ TWL XL LVL3 (GOWN DISPOSABLE) ×4 IMPLANT
GOWN STRL REUS W/TWL LRG LVL3 (GOWN DISPOSABLE) ×3
GOWN STRL REUS W/TWL XL LVL3 (GOWN DISPOSABLE) ×2
HANDPIECE INTERPULSE COAX TIP (DISPOSABLE)
K-WIRE ACE 1.6X6 (WIRE) ×9
KIT BASIN OR (CUSTOM PROCEDURE TRAY) ×3 IMPLANT
KIT TURNOVER KIT B (KITS) ×3 IMPLANT
KWIRE ACE 1.6X6 (WIRE) ×6 IMPLANT
MANIFOLD NEPTUNE II (INSTRUMENTS) ×3 IMPLANT
NS IRRIG 1000ML POUR BTL (IV SOLUTION) ×3 IMPLANT
PACK ORTHO EXTREMITY (CUSTOM PROCEDURE TRAY) ×3 IMPLANT
PAD ARMBOARD 7.5X6 YLW CONV (MISCELLANEOUS) ×6 IMPLANT
PAD CAST 4YDX4 CTTN HI CHSV (CAST SUPPLIES) ×4 IMPLANT
PADDING CAST COTTON 4X4 STRL (CAST SUPPLIES) ×2
PENCIL BUTTON HOLSTER BLD 10FT (ELECTRODE) IMPLANT
PIN HALF YELLOW 5X160X35 (EXFIX) ×6 IMPLANT
PIN TRANSFIXING 5.0 (EXFIX) ×3 IMPLANT
SCREW ACE CAN 4.0 46M (Screw) ×6 IMPLANT
SCREW ACE CAN 4.0 60M (Screw) ×6 IMPLANT
SET HNDPC FAN SPRY TIP SCT (DISPOSABLE) IMPLANT
SPONGE LAP 18X18 X RAY DECT (DISPOSABLE) ×3 IMPLANT
SPONGE LAP 4X18 RFD (DISPOSABLE) ×6 IMPLANT
STAPLER VISISTAT 35W (STAPLE) ×3 IMPLANT
STOCKINETTE IMPERVIOUS 9X36 MD (GAUZE/BANDAGES/DRESSINGS) ×3 IMPLANT
SUCTION FRAZIER HANDLE 10FR (MISCELLANEOUS) ×1
SUCTION TUBE FRAZIER 10FR DISP (MISCELLANEOUS) ×2 IMPLANT
SUT ETHILON 2 0 FS 18 (SUTURE) IMPLANT
SUT ETHILON 3 0 FSL (SUTURE) ×6 IMPLANT
SUT VIC AB 2-0 CT1 27 (SUTURE) ×2
SUT VIC AB 2-0 CT1 TAPERPNT 27 (SUTURE) ×4 IMPLANT
SUT VIC AB 3-0 CT1 27 (SUTURE) ×1
SUT VIC AB 3-0 CT1 TAPERPNT 27 (SUTURE) ×2 IMPLANT
SWAB CULTURE ESWAB REG 1ML (MISCELLANEOUS) IMPLANT
TOWEL OR 17X24 6PK STRL BLUE (TOWEL DISPOSABLE) ×3 IMPLANT
TOWEL OR 17X26 10 PK STRL BLUE (TOWEL DISPOSABLE) ×3 IMPLANT
TUBE CONNECTING 12X1/4 (SUCTIONS) ×3 IMPLANT
UNDERPAD 30X30 (UNDERPADS AND DIAPERS) ×3 IMPLANT
WATER STERILE IRR 1000ML POUR (IV SOLUTION) ×3 IMPLANT
YANKAUER SUCT BULB TIP NO VENT (SUCTIONS) ×3 IMPLANT

## 2018-06-08 NOTE — Op Note (Signed)
[name], [name] MEDICAL RECORD [id number] ACCOUNT [id number] DATE OF BIRTH:[birth year] FACILITY: [facility] LOCATION: [facility] PHYSICIAN:STEVEN Orlena Sheldon, MD  OPERATIVE REPORT  DATE OF PROCEDURE:  06/08/2018  PREOPERATIVE DIAGNOSIS:  Right open tibia/fibula fracture dislocation of the ankle.  POSTOPERATIVE DIAGNOSIS:  Right open tibia/fibula fracture dislocation of the ankle, grade II.  PROCEDURE PERFORMED:  Irrigation and debridement of open ankle fracture dislocation with internal fixation using cannulated screws for medial malleolus, followed by placement of spanning external fixator to stabilize comminuted and displaced fibular  fracture.  ATTENDING SURGEON:  Esmond Plants, MD  ASSISTANT:  Darol Destine, [place], who was scrubbed during entire procedure and necessary for satisfactory completion of surgery.  ANESTHESIA:  General.  ESTIMATED BLOOD LOSS:  Less than 50 mL.  FLUID REPLACEMENT:  1200 mL crystalloid.  COUNTS:  Instrument counts correct.  COMPLICATIONS:  None.  ANTIBIOTICS:  Perioperative antibiotics were given.  INDICATIONS:  The patient is a 67 year old male who suffered a fall off a ladder, injuring his right ankle.  The patient presented with a widely open fracture dislocation of the ankle with a large medial laceration measuring 8-10 cm in length.  The  patient had gross deformity and obvious exposed bone and ankle joint.  Irrigation and reduction performed in the emergency room acutely and a splint placed.  The patient was given antibiotics IV and also a tetanus booster.  The patient was then  transported to the operating theater for urgent I and D and operative stabilization of the ankle.  Risks and benefits of surgery were discussed with the patient.  Informed consent was obtained.  DESCRIPTION OF PROCEDURE:  After an adequate level of anesthesia was achieved, the patient was positioned supine on the operating room table.  Nonsterile tourniquet was placed  on the proximal thigh but was not utilized during the surgery.  Sterile prep  and drape performed.  Time out called.  We initially irrigated using 3 L normal saline irrigation.  The wound was clean.  We debrided sharply at the fracture margin on the medial malleolus.  We irrigated the joint well.  We then reduced the medial  malleolus anatomically and pinned provisionally with guide pins.  We then placed a total of 2 cannulated screws measuring 60 mm in length across the fracture, gaining okay purchase, but not great purchase in the patient's bone, which felt soft to me.   Once we had those screws across, we double checked our mortise, which looked well reduced.  We then went ahead and placed a spanning external fixator.  This was a delta frame.  We used 2 pins anterior to posterior in the tibia at the appropriate interval  and also a transfixing Schanz pin through the calcaneus and a standard delta construct performed giving excellent reduction of the lateral aspect of the ankle, preserving our length on our fibula.  The fibula was extensively comminuted from the level of  the mortise up several centimeters, 4 or 5 cm, and this will provide a spanning construct temporarily until skin swelling is down and until a safe time to go back and fix the fibula.  Irrigation was performed thoroughly of all sites.  We then closed the  wound with interrupted nylon closure, interrupted mattress sutures, followed by a sterile bandage for the pin sites in the lower leg and a sterile compressive bandage applied.  The patient transported in stable condition to recovery room having  tolerated surgery well.  LN/NUANCE  D:06/08/2018 T:06/08/2018 JOB:[id number]

## 2018-06-08 NOTE — Anesthesia Preprocedure Evaluation (Addendum)
Anesthesia Evaluation  Patient identified by MRN, date of birth, ID band Patient awake    Reviewed: Allergy & Precautions, NPO status , Patient's Chart, lab work & pertinent test results  Airway Mallampati: II  TM Distance: >3 FB Neck ROM: Full    Dental  (+) Teeth Intact, Dental Advisory Given   Pulmonary    breath sounds clear to auscultation       Cardiovascular  Rhythm:Regular Rate:Normal     Neuro/Psych    GI/Hepatic   Endo/Other    Renal/GU      Musculoskeletal   Abdominal   Peds  Hematology   Anesthesia Other Findings   Reproductive/Obstetrics                             Anesthesia Physical Anesthesia Plan  ASA: I and emergent  Anesthesia Plan: General   Post-op Pain Management:    Induction: Intravenous and Cricoid pressure planned  PONV Risk Score and Plan: Ondansetron  Airway Management Planned: Oral ETT  Additional Equipment:   Intra-op Plan:   Post-operative Plan: Extubation in OR  Informed Consent: I have reviewed the patients History and Physical, chart, labs and discussed the procedure including the risks, benefits and alternatives for the proposed anesthesia with the patient or authorized representative who has indicated his/her understanding and acceptance.   Dental advisory given  Plan Discussed with: CRNA and Anesthesiologist  Anesthesia Plan Comments:        Anesthesia Quick Evaluation

## 2018-06-08 NOTE — Anesthesia Postprocedure Evaluation (Signed)
Anesthesia Post Note  Patient: Jasier Calabretta  Procedure(s) Performed: IRRIGATION AND DEBRIDEMENT OPEN TIBIA FRACTURE (Right Ankle) EXTERNAL FIXATION COMMINUTED FIBULA FRACTURE (Right ) OPEN REDUCTION INTERNAL FIXATION (ORIF) MEDIAL MALLEOLUS (Right Ankle)     Patient location during evaluation: PACU Anesthesia Type: General Level of consciousness: awake and alert Pain management: pain level controlled Vital Signs Assessment: post-procedure vital signs reviewed and stable Respiratory status: spontaneous breathing, nonlabored ventilation, respiratory function stable and patient connected to nasal cannula oxygen Cardiovascular status: blood pressure returned to baseline and stable Postop Assessment: no apparent nausea or vomiting Anesthetic complications: no    Last Vitals:  Vitals:   06/08/18 2005 06/08/18 2022  BP: 128/84 140/82  Pulse: 65 71  Resp: 10 18  Temp:  (!) 36.3 C  SpO2: 100% 98%    Last Pain:  Vitals:   06/08/18 2022  TempSrc: Oral  PainSc:                  Natori Gudino,W. EDMOND

## 2018-06-08 NOTE — ED Notes (Signed)
Received call from OR stating pt is ready to be taken to [facility]. Per EDP, pt needs to be cleared by medicine before going to the OR r/t AKI. Ortho PA paged to EDP.

## 2018-06-08 NOTE — H&P (Signed)
[name] is an 67 y.o. male.   Chief Complaint: Open right ankle fx HPI: [name] was working on his house when he stepped backward about 12-18 inches down. When his foot landed it rolled and he fell to the ground. He had an obvious open fx and was brought to the ED as a level 2 trauma activation. He denied any other injury.  PMHx: DM, HLD, HTN, neuropathy  No family history on file. Social History: Denies substance use  Allergies: No Known Allergies   Results for orders placed or performed during the hospital encounter of 06/08/18 (from the past 48 hour(s))  Sample to Blood Bank     Status: None   Collection Time: 06/08/18  2:59 PM  Result Value Ref Range   Blood Bank Specimen SAMPLE AVAILABLE FOR TESTING    Sample Expiration      06/09/2018 Performed at [facility], [place] [address] [place], [place] [address]   I-Stat CG4 Lactic Acid, ED     Status: Abnormal   Collection Time: 06/08/18  3:00 PM  Result Value Ref Range   Lactic Acid, Venous 2.50 (HH) 0.5 - 1.9 mmol/L   Comment NOTIFIED PHYSICIAN   I-Stat Chem 8, ED     Status: Abnormal   Collection Time: 06/08/18  3:02 PM  Result Value Ref Range   Sodium 140 135 - 145 mmol/L   Potassium 4.6 3.5 - 5.1 mmol/L   Chloride 108 98 - 111 mmol/L   BUN 49 (H) 8 - 23 mg/dL   Creatinine, Ser 4.60 (H) 0.61 - 1.24 mg/dL   Glucose, Bld 112 (H) 70 - 99 mg/dL   Calcium, Ion 1.07 (L) 1.15 - 1.40 mmol/L   TCO2 18 (L) 22 - 32 mmol/L   Hemoglobin 9.2 (L) 13.0 - 17.0 g/dL   HCT 27.0 (L) 39.0 - 52.0 %   No results found.  Review of Systems  Constitutional: Negative for weight loss.  HENT: Negative for ear discharge, ear pain, hearing loss and tinnitus.   Eyes: Negative for blurred vision, double vision, photophobia and pain.  Respiratory: Negative for cough, sputum production and shortness of breath.   Cardiovascular: Negative for chest pain.  Gastrointestinal: Negative for abdominal pain, nausea and vomiting.  Genitourinary: Negative  for dysuria, flank pain, frequency and urgency.  Musculoskeletal: Positive for joint pain (Right ankle). Negative for back pain, falls, myalgias and neck pain.  Neurological: Negative for dizziness, tingling, sensory change, focal weakness, loss of consciousness and headaches.  Endo/Heme/Allergies: Does not bruise/bleed easily.  Psychiatric/Behavioral: Negative for depression, memory loss and substance abuse. The patient is not nervous/anxious.     Blood pressure 135/80, pulse 75, temperature 98.1 F (36.7 C), temperature source Oral, resp. rate 12, height 6\' 2"  (1.88 m), weight 95.3 kg (210 lb), SpO2 99 %. Physical Exam  Constitutional: He appears well-developed and well-nourished. No distress.  HENT:  Head: Normocephalic and atraumatic.  Eyes: Conjunctivae are normal. Right eye exhibits no discharge. Left eye exhibits no discharge. No scleral icterus.  Neck: Normal range of motion.  Cardiovascular: Normal rate and regular rhythm.  Respiratory: Effort normal. No respiratory distress.  Musculoskeletal:  RLE Open medial ankle fx (see pic)  No knee or ankle effusion  Knee stable to varus/ valgus and anterior/posterior stress  Sens DPN, SPN, TN absent (also on left, likely baseline)  Motor EHL, ext, flex, evers 5/5  DP 2+  Neurological: He is alert.  Skin: Skin is warm and dry. He is not  diaphoretic.  Psychiatric: He has a normal mood and affect. His behavior is normal.       Assessment/Plan Right open ankle fx/dislocation -- s/p reduction in ED via CS. Will go to OR tonight for I&D and ex fix vs ORIF by Dr. Veverly Fells. NPO until then. Medical problems -- Home meds    Lisette Abu, PA-C Orthopedic Surgery [phone] 06/08/2018, 3:10 PM

## 2018-06-08 NOTE — ED Notes (Signed)
Internal medicine bedside at this time

## 2018-06-08 NOTE — Procedures (Signed)
Procedure: Right ankle reduction  Indication: Right open ankle fracture/dislocation  Surgeon: Silvestre Gunner, PA-C  Assist: None  Anesthesia: Propofol via EDP  EBL: None  Complications: None  Findings: After risks/benefits explained patient desires to undergo procedure. Consent obtained and time out performed. Sedation obtained, ankle reduced and splinted, confirmed reduction with x-ray.    Lisette Abu, PA-C Orthopedic Surgery [phone]

## 2018-06-08 NOTE — Progress Notes (Signed)
Orthopedic Tech Progress Note Patient Details:  [name] 01-15-1951 [id number]  Ortho Devices Type of Ortho Device: Ace wrap, Post (short leg) splint, Stirrup splint Ortho Device/Splint Interventions: Application   Post Interventions Patient Tolerated: Well Instructions Provided: Care of device   [name] 06/08/2018, 2:58 PM

## 2018-06-08 NOTE — Anesthesia Procedure Notes (Signed)
Procedure Name: Intubation Date/Time: 06/08/2018 6:06 PM Performed by: Teressa Lower., CRNA Pre-anesthesia Checklist: Patient identified, Emergency Drugs available, Suction available and Patient being monitored Patient Re-evaluated:Patient Re-evaluated prior to induction Oxygen Delivery Method: Circle system utilized Preoxygenation: Pre-oxygenation with 100% oxygen Induction Type: IV induction, Cricoid Pressure applied and Rapid sequence Laryngoscope Size: Miller and 3 Grade View: Grade II Tube type: Oral Tube size: 7.5 mm Number of attempts: 1 Airway Equipment and Method: Stylet and Patient positioned with wedge pillow Placement Confirmation: ETT inserted through vocal cords under direct vision,  positive ETCO2 and breath sounds checked- equal and bilateral Secured at: 22 cm Tube secured with: Tape Dental Injury: Teeth and Oropharynx as per pre-operative assessment  Difficulty Due To: Difficulty was anticipated and Difficult Airway- due to anterior larynx Comments: Pt placed in ramp position with blankets/pillows, RSI with Anectine and cricoid pressure, DLx1 with miller 3, grade 2 view, ett secured, + etco2, BBS, mouth as preop.

## 2018-06-08 NOTE — ED Triage Notes (Signed)
Pt arrives to ED from home with complaints of stepping through a 2 foot step ladder, which snapped and injured his foot since this afternoon. EMS reports pt has open fracture to the foot, tourniquet in place, unable to palpate pulse but pt is able to move his toes. VSS, pt alert and oriented. Pt placed in position of comfort with bed locked and lowered, call bell in reach.

## 2018-06-08 NOTE — Transfer of Care (Signed)
Immediate Anesthesia Transfer of Care Note  Patient: [name]  Procedure(s) Performed: IRRIGATION AND DEBRIDEMENT OPEN TIBIA FRACTURE (Right Ankle) EXTERNAL FIXATION COMMINUTED FIBULA FRACTURE (Right ) OPEN REDUCTION INTERNAL FIXATION (ORIF) MEDIAL MALLEOLUS (Right Ankle)  Patient Location: PACU  Anesthesia Type:General  Level of Consciousness: awake, alert  and oriented  Airway & Oxygen Therapy: Patient Spontanous Breathing  Post-op Assessment: Report given to RN and Post -op Vital signs reviewed and stable  Post vital signs: Reviewed and stable  Last Vitals:  Vitals Value Taken Time  BP 143/82 06/08/2018  7:05 PM  Temp    Pulse 74 06/08/2018  7:08 PM  Resp 12 06/08/2018  7:08 PM  SpO2 99 % 06/08/2018  7:08 PM  Vitals shown include unvalidated device data.  Last Pain:  Vitals:   06/08/18 1906  TempSrc:   PainSc: (P) 0-No pain         Complications: No apparent anesthesia complications

## 2018-06-08 NOTE — ED Provider Notes (Signed)
[facility] DEPART[facility] Provider Note   CSN: [id number] Arrival date & time: 06/08/18  1421     History   Chief Complaint Chief Complaint  Patient presents with  . Foot Injury    HPI [name] is a 67 y.o. male.  HPI   67 year old male here with open fracture of the right ankle.  The patient states he has been working on his house outside all day.  He stepped down off a ladder and experienced acute onset of severe right ankle pain and deformity.  He looked down and saw bone and blood.  He denies falling or hitting his head.  He since had aching, throbbing right foot pain.  Applied his belt as a tourniquet.  On EMS arrival, they applied a tourniquet.  They noticed obvious open fracture and deformity.  He was given fentanyl with symptomatic improvement.  Denies any numbness or weakness.  No history of previous injuries.  History reviewed. No pertinent past medical history.  Patient Active Problem List   Diagnosis Date Noted  . Open right ankle fracture 06/08/2018  . Fracture, ankle 06/08/2018  . Open displaced fracture of medial malleolus of tibia, type III 06/08/2018    History reviewed. No pertinent surgical history.      Home Medications    Prior to Admission medications   Medication Sig Start Date End Date Taking? Authorizing Provider  lisinopril (PRINIVIL,ZESTRIL) 2.5 MG tablet Take 2.5 mg by mouth daily. 03/18/18  Yes [provider]  metFORMIN (GLUCOPHAGE) 500 MG tablet Take 500 mg by mouth daily. 05/01/18  Yes [provider]  simvastatin (ZOCOR) 20 MG tablet Take 20 mg by mouth every evening. 04/30/18  Yes [provider]  sodium bicarbonate 650 MG tablet Take 650 mg by mouth 2 (two) times daily. 05/06/18  Yes [provider]    Family History History reviewed. No pertinent family history.  Social History Social History   Tobacco Use  . Smoking status: Never Smoker  . Smokeless tobacco: Never Used    Substance Use Topics  . Alcohol use: Not on file  . Drug use: Not on file     Allergies   Patient has no known allergies.   Review of Systems Review of Systems  Constitutional: Positive for fatigue. Negative for chills and fever.  HENT: Negative for congestion and rhinorrhea.   Eyes: Negative for visual disturbance.  Respiratory: Negative for cough, shortness of breath and wheezing.   Cardiovascular: Negative for chest pain and leg swelling.  Gastrointestinal: Negative for abdominal pain, diarrhea, nausea and vomiting.  Genitourinary: Negative for dysuria and flank pain.  Musculoskeletal: Positive for arthralgias. Negative for neck pain and neck stiffness.  Skin: Positive for wound. Negative for color change and rash.  Allergic/Immunologic: Negative for immunocompromised state.  Neurological: Negative for syncope, weakness and headaches.  All other systems reviewed and are negative.    Physical Exam Updated Vital Signs BP 140/82 (BP Location: Right Arm)   Pulse 71   Temp (!) 97.4 F (36.3 C) (Oral)   Resp 18   Ht 6\' 2"  (1.88 m)   Wt 95.3 kg (210 lb)   SpO2 98%   BMI 26.96 kg/m   Physical Exam  Constitutional: He is oriented to person, place, and time. He appears well-developed and well-nourished. No distress.  HENT:  Head: Normocephalic and atraumatic.  Eyes: Conjunctivae are normal.  Neck: Neck supple.  Cardiovascular: Normal rate, regular rhythm and normal heart sounds. Exam reveals no  friction rub.  No murmur heard. Pulmonary/Chest: Effort normal and breath sounds normal. No respiratory distress. He has no wheezes. He has no rales.  Abdominal: He exhibits no distension.  Musculoskeletal: He exhibits no edema.  Neurological: He is alert and oriented to person, place, and time. He exhibits normal muscle tone.  Skin: Skin is warm. Capillary refill takes less than 2 seconds.  Psychiatric: He has a normal mood and affect.  Nursing note and vitals  reviewed.   LOWER EXTREMITY EXAM: RIGHT  INSPECTION & PALPATION: Open fracture/dislocation at ankle, venous bleeding, no pulsatile bleeding.  SENSORY: sensation is intact to light touch in:  Superficial peroneal nerve distribution (over dorsum of foot) Deep peroneal nerve distribution (over first dorsal web space) Sural nerve distribution (over lateral aspect 5th metatarsal) Saphenous nerve distribution (over medial instep)  MOTOR:  + Motor EHL (great toe dorsiflexion) + FHL (great toe plantar flexion)  + TA (ankle dorsiflexion)  + GSC (ankle plantar flexion)  VASCULAR: 2+ dorsalis pedis pulses Capillary refill < 2 sec, toes warm and well-perfused  COMPARTMENTS: Soft, warm, well-perfused No pain with passive extension No parethesias   ED Treatments / Results  Labs (all labs ordered are listed, but only abnormal results are displayed) Labs Reviewed  COMPREHENSIVE METABOLIC PANEL - Abnormal; Notable for the following components:      Result Value   CO2 17 (*)    Glucose, Bld 111 (*)    BUN 51 (*)    Creatinine, Ser 4.44 (*)    Calcium 8.2 (*)    Total Protein 6.0 (*)    GFR calc non Af Amer 13 (*)    GFR calc Af Amer 14 (*)    All other components within normal limits  CBC - Abnormal; Notable for the following components:   RBC 2.92 (*)    Hemoglobin 9.0 (*)    HCT 28.8 (*)    All other components within normal limits  URINALYSIS, ROUTINE W REFLEX MICROSCOPIC - Abnormal; Notable for the following components:   Color, Urine STRAW (*)    Hgb urine dipstick SMALL (*)    Leukocytes, UA TRACE (*)    Bacteria, UA RARE (*)    All other components within normal limits  GLUCOSE, CAPILLARY - Abnormal; Notable for the following components:   Glucose-Capillary 132 (*)    All other components within normal limits  I-STAT CHEM 8, ED - Abnormal; Notable for the following components:   BUN 49 (*)    Creatinine, Ser 4.60 (*)    Glucose, Bld 112 (*)    Calcium, Ion 1.07 (*)     TCO2 18 (*)    Hemoglobin 9.2 (*)    HCT 27.0 (*)    All other components within normal limits  I-STAT CG4 LACTIC ACID, ED - Abnormal; Notable for the following components:   Lactic Acid, Venous 2.50 (*)    All other components within normal limits  CDS SEROLOGY  ETHANOL  PROTIME-INR  CK  LACTIC ACID, PLASMA  GLUCOSE, CAPILLARY  HIV ANTIBODY (ROUTINE TESTING)  BASIC METABOLIC PANEL  CBC  SAMPLE TO BLOOD BANK    EKG None  Radiology Dg Ankle 2 Views Right  Result Date: 06/08/2018 CLINICAL DATA:  Open right ankle fracture. EXAM: RIGHT ANKLE - 2 VIEW; DG C-ARM 61-120 MIN COMPARISON:  Right ankle x-rays from same day. FLUOROSCOPY TIME:  46 seconds. C-arm fluoroscopic images were obtained intraoperatively and submitted for post operative interpretation. FINDINGS: Multiple intraoperative fluoroscopic images demonstrate interval  ORIF medial malleolar fracture with two partially cannulated screws. Unchanged comminuted distal fibular fracture. Ankle mortise is grossly intact. External fixation hardware within the tibia and calcaneus. Diffuse soft tissue swelling about the ankle. IMPRESSION: 1. Postsurgical changes related to interval external fixation and medial malleolar ORIF. 2. Unchanged severely comminuted distal fibular fracture. Electronically Signed   By: Titus Dubin M.D.   On: 06/08/2018 20:17   Dg Ankle Right Port  Result Date: 06/08/2018 CLINICAL DATA:  Patient fell off a ladder and rolled his right ankle. Open fracture. Post Re-Location Image. EXAM: PORTABLE RIGHT ANKLE - 2 VIEW COMPARISON:  None. FINDINGS: Oblique fracture through the medial malleolus which enters articular surface. A comminuted fracture of the lateral malleolus. Concern for fracture of the tibial plafond. The talar dome appears normal. Ankle mortise grossly intact. On lateral projection there is potential fracture of the posterior aspect of the distal tibia (posterior malleolus). The talocalcaneal joint is  narrowed with concern for talus fracture. Boehler's angle appears flattened. Cannot exclude calcaneal fracture. IMPRESSION: 1. Probable trimalleolar fracture.  Ankle mortise is grossly intact. 2. Concern for talus fracture along the talocalcaneal joint 3. Concern for calcaneal fracture. 4. Recommend CT ankle further evaluation of this complex fracture pattern. Electronically Signed   By: Suzy Bouchard M.D.   On: 06/08/2018 15:17   Dg C-arm 1-60 Min  Result Date: 06/08/2018 CLINICAL DATA:  Open right ankle fracture. EXAM: RIGHT ANKLE - 2 VIEW; DG C-ARM 61-120 MIN COMPARISON:  Right ankle x-rays from same day. FLUOROSCOPY TIME:  46 seconds. C-arm fluoroscopic images were obtained intraoperatively and submitted for post operative interpretation. FINDINGS: Multiple intraoperative fluoroscopic images demonstrate interval ORIF medial malleolar fracture with two partially cannulated screws. Unchanged comminuted distal fibular fracture. Ankle mortise is grossly intact. External fixation hardware within the tibia and calcaneus. Diffuse soft tissue swelling about the ankle. IMPRESSION: 1. Postsurgical changes related to interval external fixation and medial malleolar ORIF. 2. Unchanged severely comminuted distal fibular fracture. Electronically Signed   By: Titus Dubin M.D.   On: 06/08/2018 20:17   Dg C-arm 1-60 Min  Result Date: 06/08/2018 CLINICAL DATA:  Open right ankle fracture. EXAM: RIGHT ANKLE - 2 VIEW; DG C-ARM 61-120 MIN COMPARISON:  Right ankle x-rays from same day. FLUOROSCOPY TIME:  46 seconds. C-arm fluoroscopic images were obtained intraoperatively and submitted for post operative interpretation. FINDINGS: Multiple intraoperative fluoroscopic images demonstrate interval ORIF medial malleolar fracture with two partially cannulated screws. Unchanged comminuted distal fibular fracture. Ankle mortise is grossly intact. External fixation hardware within the tibia and calcaneus. Diffuse soft tissue  swelling about the ankle. IMPRESSION: 1. Postsurgical changes related to interval external fixation and medial malleolar ORIF. 2. Unchanged severely comminuted distal fibular fracture. Electronically Signed   By: Titus Dubin M.D.   On: 06/08/2018 20:17    Procedures .Critical Care Performed by: Duffy Bruce, MD Authorized by: Duffy Bruce, MD   Critical care provider statement:    Critical care time (minutes):  35   Critical care time was exclusive of:  Separately billable procedures and treating other patients and teaching time   Critical care was necessary to treat or prevent imminent or life-threatening deterioration of the following conditions:  Trauma   Critical care was time spent personally by me on the following activities:  Development of treatment plan with patient or surrogate, discussions with consultants, evaluation of patient's response to treatment, examination of patient, obtaining history from patient or surrogate, ordering and performing treatments and interventions, ordering and  review of laboratory studies, ordering and review of radiographic studies, pulse oximetry, re-evaluation of patient's condition and review of old charts   I assumed direction of critical care for this patient from another provider in my specialty: no   .Sedation Date/Time: 06/08/2018 11:39 PM Performed by: Duffy Bruce, MD Authorized by: Duffy Bruce, MD   Consent:    Consent obtained:  Verbal   Consent given by:  Patient   Risks discussed:  Allergic reaction, dysrhythmia, inadequate sedation, nausea, prolonged hypoxia resulting in organ damage, prolonged sedation necessitating reversal, respiratory compromise necessitating ventilatory assistance and intubation and vomiting   Alternatives discussed:  Analgesia without sedation, anxiolysis and regional anesthesia Universal protocol:    Procedure explained and questions answered to patient or proxy's satisfaction: yes     Relevant  documents present and verified: yes     Test results available and properly labeled: yes     Imaging studies available: yes     Required blood products, implants, devices, and special equipment available: yes     Site/side marked: yes     Immediately prior to procedure a time out was called: yes     Patient identity confirmation method:  Verbally with patient Indications:    Procedure necessitating sedation performed by:  Physician performing sedation   Intended level of sedation:  Deep Pre-sedation assessment:    Time since last food or drink:  Emergency   NPO status caution: urgency dictates proceeding with non-ideal NPO status     ASA classification: class 1 - normal, healthy patient     Neck mobility: normal     Mouth opening:  3 or more finger widths   Thyromental distance:  4 finger widths   Mallampati score:  I - soft palate, uvula, fauces, pillars visible   Pre-sedation assessments completed and reviewed: airway patency, cardiovascular function, hydration status, mental status, nausea/vomiting, pain level, respiratory function and temperature   Immediate pre-procedure details:    Reassessment: Patient reassessed immediately prior to procedure     Reviewed: vital signs, relevant labs/tests and NPO status     Verified: bag valve mask available, emergency equipment available, intubation equipment available, IV patency confirmed, oxygen available and suction available   Procedure details (see MAR for exact dosages):    Preoxygenation:  Nasal cannula   Sedation:  Propofol   Intra-procedure monitoring:  Blood pressure monitoring, cardiac monitor, continuous pulse oximetry, frequent LOC assessments, frequent vital sign checks and continuous capnometry   Intra-procedure events: none     Total Provider sedation time (minutes):  20 Post-procedure details:    Attendance: Constant attendance by certified staff until patient recovered     Recovery: Patient returned to pre-procedure baseline      Post-sedation assessments completed and reviewed: airway patency, cardiovascular function, hydration status, mental status, nausea/vomiting, pain level, respiratory function and temperature     Patient is stable for discharge or admission: yes     Patient tolerance:  Tolerated well, no immediate complications   (including critical care time)  Medications Ordered in ED Medications  propofol (DIPRIVAN) 10 mg/mL bolus/IV push 47.7 mg ( Intravenous MAR Unhold 06/08/18 2014)  acetaminophen (TYLENOL) tablet 650 mg ( Oral MAR Unhold 06/08/18 2014)    Or  acetaminophen (TYLENOL) suppository 650 mg ( Rectal MAR Unhold 06/08/18 2014)  enoxaparin (LOVENOX) injection 30 mg ( Subcutaneous MAR Unhold 06/08/18 2014)  methocarbamol (ROBAXIN) tablet 500 mg (500 mg Oral Given 06/08/18 2243)    Or  methocarbamol (ROBAXIN) 500 mg in  dextrose 5 % 50 mL IVPB ( Intravenous See Alternative 06/08/18 2243)  morphine 4 MG/ML injection 2 mg ( Intravenous MAR Unhold 06/08/18 2014)  ondansetron (ZOFRAN) tablet 4 mg ( Oral MAR Unhold 06/08/18 2014)    Or  ondansetron (ZOFRAN) injection 4 mg ( Intravenous MAR Unhold 06/08/18 2014)  oxyCODONE (Oxy IR/ROXICODONE) immediate release tablet 5-15 mg (10 mg Oral Given 06/08/18 2243)  0.9 %  sodium chloride infusion ( Intravenous Stopped 06/08/18 1905)  ceFAZolin (ANCEF) 2-4 GM/100ML-% IVPB (has no administration in time range)  0.9 %  sodium chloride infusion ( Intravenous New Bag/Given 06/08/18 2242)  insulin aspart (novoLOG) injection 0-9 Units (has no administration in time range)  insulin aspart (novoLOG) injection 0-5 Units (0 Units Subcutaneous Not Given 06/08/18 2229)  simvastatin (ZOCOR) tablet 20 mg (has no administration in time range)  sodium bicarbonate tablet 650 mg (650 mg Oral Given 06/08/18 2243)  calcium carbonate (TUMS - dosed in mg elemental calcium) chewable tablet 400 mg of elemental calcium (400 mg of elemental calcium Oral Given 06/08/18 2243)  docusate sodium (COLACE) capsule  100 mg (100 mg Oral Given 06/08/18 2243)  metoCLOPramide (REGLAN) tablet 5-10 mg (has no administration in time range)    Or  metoCLOPramide (REGLAN) injection 5-10 mg (has no administration in time range)  ceFAZolin (ANCEF) IVPB 2g/100 mL premix (has no administration in time range)  polyethylene glycol (MIRALAX / GLYCOLAX) packet 17 g (has no administration in time range)  bisacodyl (DULCOLAX) suppository 10 mg (has no administration in time range)  HYDROmorphone (DILAUDID) 1 MG/ML injection (1 mg  Given 06/08/18 1430)  propofol (DIPRIVAN) 10 mg/mL bolus/IV push ( Intravenous Canceled Entry 06/08/18 1445)  ceFAZolin (ANCEF) IVPB 2g/100 mL premix (0 g Intravenous Stopped 06/08/18 1521)  sodium chloride 0.9 % bolus 1,000 mL (1,000 mLs Intravenous Transfusing/Transfer 06/08/18 1639)  sodium chloride 0.9 % bolus 1,000 mL (1,000 mLs Intravenous Transfusing/Transfer 06/08/18 1639)     Initial Impression / Assessment and Plan / ED Course  I have reviewed the triage vital signs and the nursing notes.  Pertinent labs & imaging results that were available during my care of the patient were reviewed by me and considered in my medical decision making (see chart for details).     67 year old male here with open fracture and dislocation of the right ankle.  On arrival, patient sedated and fracture reduced by orthopedics.  Patient tolerated well.  Lab work shows mild lactic acidosis as well as AKI.  I called the patient's PCP and his baseline creatinine is 2-2.5.  Suspect this is prerenal secondary to working outside.  Discussed with orthopedics, will admit to medicine.  Final Clinical Impressions(s) / ED Diagnoses   Final diagnoses:  AKI (acute kidney injury) ([address])  Dehydration  Type III open fracture of right ankle, initial encounter    ED Discharge Orders    None       Duffy Bruce, MD 06/08/18 2341

## 2018-06-08 NOTE — Progress Notes (Signed)
OR brought patient watch and coins to patient bedside. Placed in patient belongings bag. Patient aware.

## 2018-06-08 NOTE — Brief Op Note (Signed)
06/08/2018  6:48 PM  PATIENT:  [name]  67 y.o. male  PRE-OPERATIVE DIAGNOSIS:  Grade 2 Open right ankle fx/dislocation  POST-OPERATIVE DIAGNOSIS:  Grade 2Open right ankle fx/dislocation  PROCEDURE:  Procedure(s): IRRIGATION AND DEBRIDEMENT OPEN TIBIA FRACTURE (Right) EXTERNAL FIXATION LEG (Right) OPEN REDUCTION INTERNAL FIXATION (ORIF) MEDIAL MALLEOLUS (Right)  SURGEON:  Surgeon(s) and Role:    Netta Cedars, MD - Primary  PHYSICIAN ASSISTANT:   ASSISTANTS: Ventura Bruns, PA-C   ANESTHESIA:   general  EBL:  Minimal   BLOOD ADMINISTERED:none  DRAINS: none   LOCAL MEDICATIONS USED:  NONE  SPECIMEN:  No Specimen  DISPOSITION OF SPECIMEN:  N/A  COUNTS:  YES  TOURNIQUET:  * Missing tourniquet times found for documented tourniquets in log: [id number] *  DICTATION: .Other Dictation: Dictation Number [id number]  PLAN OF CARE: Admit to inpatient   PATIENT DISPOSITION:  PACU - hemodynamically stable.   Delay start of Pharmacological VTE agent (>24hrs) due to surgical blood loss or risk of bleeding: no

## 2018-06-08 NOTE — H&P (Signed)
[facility] Admission History and Physical Service Pager: [phone]  Patient name: Nevan Creighton Medical record number: [id number] Date of birth: 21-Jun-1951 Age: 67 y.o. Gender: male  Primary Care Provider: No primary care provider on file. Consultants: Ortho Code Status: full  Chief Complaint: Open right ankle fracture    Assessment and Plan: Karma Hiney is a 67 y.o. male presenting with Open right ankle fracture . PMH is significant for T2DM, CIPD (chronic inflammatroy demyelinating polyneuropathy), HTN, HLD, chronic acidosis, CKD (baseline creatinine ~2.5)  Open Right Ankle Fracture: Sedated in ED and reduced.  Ortho taking to surgery now.  No LOC, patient rolled his ankle stepping off ladder, pain controlled in ED. Stable vitals.  S/p 2L bolus, cefazolin, reduction under sedation.  Distal sensation on right reduced but at baseline per patient, distal perfusion intact.  Ankle not visible as it had been reduced and wrapped by ortho -admit to med-surg, attending Dr. Gwendlyn Deutscher -ortho consulted, surgical management appreciated -pain meds/VTE prophylaxis per ortho -pt/ot -cafazxolin for infection prophylaxis  AKI on chronic CKD w/ chronic acidosis: Patient is a patient at [facility] in [place], [place].  ED physician states that he contacted his PCP's office and was told that in June Cr was 2.3.  In E.D., patient's Cr 4.44, with repeat 30 minutes later 4.60.  Patient notes that he has been working outside in the ED all week.  ON 650 bicarb BID at home.  Suspect pre-renal. S/P 2L NS in ED. S/p 2L NS bolus in ED.  He describes no urinary symptoms and no recent illnesses so intrinsic and obstructive etiologies are less likely. -holding metformin/lisinopril -additional 100/hr NS infusion for 10hrs overnight -am bmp -avoid nephrotoxic agents -continue bicarb  Lactic acidosis: 2.5 on admission, suspect potential metformin acidosis in setting of recent dehydration.   Patient does not appear infections via vitals/exam.  S/P 2L bolus -additional IVF per AKI -hold metformin -redraw at 2000  T2DM: on Metformin 500mg  BID -sSSI -hold metformin  HLD: Simvastatin 20mg   HTN: Lisinopril 2.5mg  QD -holding lisinopril in setting of AKI  Hypocalcemia- chronic, on Vit D 1000 at home -tums BID -home Vitamin D  History of Chronic inflammatory demyelinating polyneuropathy with chronic foot drop: Patient claims in 2012 started with significant flare that was treated and cleared a few years later.  Says no residual deficits but a slight foot drop on right (which he states could also be impacted from trauma to lateral knee).  Has multiple fractures in toes/foot on right from this issue. Distal sensation still baseline per patient. -PT/OT will eval post surgery   FEN/GI: carb modified diet once cleared by surgery Prophylaxis: scd on left left, chemical per ortho recs  Disposition: home vs rehab per ortho recs  History of Present Illness:  Brently Voorhis is a 67 y.o. male presenting with open right ankle fracture injued while stepping off a ladder.  This was a mechanical injury and he had no LOC, no vertigo, or prodrome of any kind.  He says the ground was uneven and prior injuries and a chronic foot drop on that side make him "a little clumsy" on that side.  He saw how much blood was accumulating and used a belt to toruniquet his leg and then called 911.   He says he is very active and healthy and has been building his own house by hand.   It has been hot all week and he admits to not drinking water "like I should" and  was not surprised about AKI.  He denies any illicit substance use and states he takes his medicines exactly as prescribed.  He says his pain is controlled just fine on admission exam and wanted to know if he could get pictures of the injury from his chart.  Review Of Systems: Per HPI with the following additions:   Review of Systems  Constitutional: Negative  for chills, fever and weight loss.  HENT: Negative for ear pain and nosebleeds.   Eyes: Negative for blurred vision.  Respiratory: Negative for cough, shortness of breath and wheezing.   Cardiovascular: Negative for chest pain, palpitations and leg swelling.  Gastrointestinal: Negative for blood in stool, constipation, diarrhea, nausea and vomiting.  Genitourinary: Negative for dysuria, flank pain, hematuria and urgency.  Musculoskeletal: Positive for falls. Negative for myalgias.  Skin: Negative for itching and rash.  Neurological: Negative for dizziness, seizures, loss of consciousness, weakness and headaches.  Endo/Heme/Allergies: Does not bruise/bleed easily.  Psychiatric/Behavioral: Negative for depression, memory loss and substance abuse. The patient does not have insomnia.     Patient Active Problem List   Diagnosis Date Noted  . Open right ankle fracture 06/08/2018  . Fracture, ankle 06/08/2018    Past Medical History: History reviewed. No pertinent past medical history.  Past Surgical History: History reviewed. No pertinent surgical history.  Social History: Social History   Tobacco Use  . Smoking status: Never Smoker  . Smokeless tobacco: Never Used  Substance Use Topics  . Alcohol use: Not on file  . Drug use: Not on file   Additional social history: no substance use  Please also refer to relevant sections of EMR.  Family History: History reviewed. No pertinent family history. No family hx of problems with anesthesia  Allergies and Medications: No Known Allergies No current facility-administered medications on file prior to encounter.    Current Outpatient Medications on File Prior to Encounter  Medication Sig Dispense Refill  . lisinopril (PRINIVIL,ZESTRIL) 2.5 MG tablet Take 2.5 mg by mouth daily.  0  . metFORMIN (GLUCOPHAGE) 500 MG tablet Take 500 mg by mouth daily.  0  . simvastatin (ZOCOR) 20 MG tablet Take 20 mg by mouth every evening.  0  . sodium  bicarbonate 650 MG tablet Take 650 mg by mouth 2 (two) times daily.  0    Objective: BP 132/74   Pulse 71   Temp 98.1 F (36.7 C) (Oral)   Resp 12   Ht 6\' 2"  (1.88 m)   Wt 210 lb (95.3 kg)   SpO2 99%   BMI 26.96 kg/m  Exam: General: alert, NAD, pleasant Eyes: clear sclera, EOMI ENTM: no rhinorhea, slightly dry mucous membranes Cardiovascular: RRR, no murmurs noted on exam, cap refil ~3 Respiratory: no IWB, CTAB, no wheezes/crackles Gastrointestinal: no belly pain, soft with no lesions noted MSK: right ankle wrapped in gauze and not visible, blood still on sheet from prior reduction, distal sensation reduced but at baseline per patient, distal perfusion intact ~3 bilaterally, obvious deformities of multiple toes consistent with hx of multiple fractures due to foot drop Derm: no lesions visible to exposed skin Neuro: grossly intact Psych: appropriate thought process, pleasant  Labs and Imaging: CBC BMET  Recent Labs  Lab 06/08/18 1432 06/08/18 1502  WBC 5.0  --   HGB 9.0* 9.2*  HCT 28.8* 27.0*  PLT 195  --    Recent Labs  Lab 06/08/18 1432 06/08/18 1502  NA 139 140  K 4.7 4.6  CL 110 108  CO2 17*  --   BUN 51* 49*  CREATININE 4.44* 4.60*  GLUCOSE 111* 112*  CALCIUM 8.2*  --      Lactic acid 2.5  Sherene Sires, DO 06/08/2018, 5:49 PM PGY-2, [facility] pager: [phone], text pages welcome

## 2018-06-09 ENCOUNTER — Encounter (HOSPITAL_COMMUNITY): Payer: Self-pay | Admitting: General Practice

## 2018-06-09 ENCOUNTER — Inpatient Hospital Stay (HOSPITAL_COMMUNITY): Payer: Medicare Other

## 2018-06-09 DIAGNOSIS — N179 Acute kidney failure, unspecified: Secondary | ICD-10-CM

## 2018-06-09 DIAGNOSIS — E860 Dehydration: Secondary | ICD-10-CM

## 2018-06-09 DIAGNOSIS — S8251XC Displaced fracture of medial malleolus of right tibia, initial encounter for open fracture type IIIA, IIIB, or IIIC: Secondary | ICD-10-CM

## 2018-06-09 DIAGNOSIS — S82891C Other fracture of right lower leg, initial encounter for open fracture type IIIA, IIIB, or IIIC: Secondary | ICD-10-CM

## 2018-06-09 LAB — BASIC METABOLIC PANEL
Anion gap: 10 (ref 5–15)
Anion gap: 11 (ref 5–15)
BUN: 48 mg/dL — ABNORMAL HIGH (ref 8–23)
BUN: 48 mg/dL — ABNORMAL HIGH (ref 8–23)
CO2: 17 mmol/L — ABNORMAL LOW (ref 22–32)
CO2: 19 mmol/L — ABNORMAL LOW (ref 22–32)
Calcium: 7.6 mg/dL — ABNORMAL LOW (ref 8.9–10.3)
Calcium: 7.7 mg/dL — ABNORMAL LOW (ref 8.9–10.3)
Chloride: 110 mmol/L (ref 98–111)
Chloride: 107 mmol/L (ref 98–111)
Creatinine, Ser: 3.94 mg/dL — ABNORMAL HIGH (ref 0.61–1.24)
Creatinine, Ser: 4.06 mg/dL — ABNORMAL HIGH (ref 0.61–1.24)
GFR calc Af Amer: 17 mL/min — ABNORMAL LOW (ref >60)
GFR calc Af Amer: 16 mL/min — ABNORMAL LOW (ref >60)
GFR calc non Af Amer: 14 mL/min — ABNORMAL LOW (ref >60)
GFR calc non Af Amer: 14 mL/min — ABNORMAL LOW (ref >60)
Glucose, Bld: 182 mg/dL — ABNORMAL HIGH (ref 70–99)
Glucose, Bld: 157 mg/dL — ABNORMAL HIGH (ref 70–99)
Potassium: 5.8 mmol/L — ABNORMAL HIGH (ref 3.5–5.1)
Potassium: 5.3 mmol/L — ABNORMAL HIGH (ref 3.5–5.1)
Sodium: 137 mmol/L (ref 135–145)
Sodium: 137 mmol/L (ref 135–145)

## 2018-06-09 LAB — GLUCOSE, CAPILLARY
Glucose-Capillary: 147 mg/dL — ABNORMAL HIGH (ref 70–99)
Glucose-Capillary: 177 mg/dL — ABNORMAL HIGH (ref 70–99)
Glucose-Capillary: 108 mg/dL — ABNORMAL HIGH (ref 70–99)
Glucose-Capillary: 103 mg/dL — ABNORMAL HIGH (ref 70–99)

## 2018-06-09 LAB — CBC
HCT: 28.4 % — ABNORMAL LOW (ref 39.0–52.0)
Hemoglobin: 8.9 g/dL — ABNORMAL LOW (ref 13.0–17.0)
MCH: 31.1 pg (ref 26.0–34.0)
MCHC: 31.3 g/dL (ref 30.0–36.0)
MCV: 99.3 fL (ref 78.0–100.0)
Platelets: 197 10*3/uL (ref 150–400)
RBC: 2.86 MIL/uL — ABNORMAL LOW (ref 4.22–5.81)
RDW: 12.8 % (ref 11.5–15.5)
WBC: 8.7 10*3/uL (ref 4.0–10.5)

## 2018-06-09 LAB — HIV ANTIBODY (ROUTINE TESTING W REFLEX): HIV Screen 4th Generation wRfx: NONREACTIVE

## 2018-06-09 MED ORDER — SODIUM CHLORIDE 0.9 % IV SOLN: INTRAVENOUS | Status: AC

## 2018-06-09 MED ORDER — CEFTRIAXONE SODIUM 2 G IJ SOLR
2.0000 g | INTRAMUSCULAR | Status: AC
  Administered 2018-06-09 – 2018-06-10 (×2): 2 g via INTRAVENOUS
  Filled 2018-06-09 (×2): qty 20

## 2018-06-09 NOTE — Consult Note (Signed)
Orthopaedic Trauma Service (OTS) Consult   Patient ID: [name] MRN: [id number] DOB/AGE: [birth year] 68 y.o.  Reason for Consult:Right open ankle fracture dislocation Referring Physician: Dr. Netta Cedars, MD [facility]  HPI: [name] is an 66 y.o. male who is being seen in consultation at the request of Dr. Veverly Fells for evaluation of right open ankle fracture dislocation.  The patient was doing yard work, when he was on a ladder.  He was taking his tools down where he slipped fell landed on his foot had immediate pain deformity and open wound.  He was brought to the emergency room where he had an open fracture dislocation.  He was reduced provisionally and taken to the operating room last night for irrigation debridement ORIF his medial malleolus and external fixation by Dr. Veverly Fells.  I was asked to consult due to the complexity of his injury and need for an orthopedic traumatologist.  Patient lives alone.  However he lives on a one-story as of 3 steps to get into his house.  He at baseline walks without an assistive device.  He has some sons and family in town that can assist with him post discharge.  He has chronic inflammatory neuropathy with a diminished pain sensation to his bilateral lower extremities.  He also has a previous peroneal nerve injury from an accident years ago which she had some residual foot drop.  He also had a midfoot fracture years ago that causes him to have some forefoot valgus.  He has received Ancef postoperatively for surgical prophylaxis and open wound prophylaxis.  Denies any other injuries.  He does note that he was dehydrated during this week doing all his work and his chronic kidney disease had increased in his creatinine.  Medical history: Chronic kidney disease, polyneuropathy, type 2 diabetes without insulin dependence  History reviewed. No pertinent surgical history.  History reviewed. No pertinent family history.  Social History:  reports that he has never  smoked. He has never used smokeless tobacco. His alcohol and drug histories are not on file.  Allergies: No Known Allergies  Medications:  No current facility-administered medications on file prior to encounter.    Current Outpatient Medications on File Prior to Encounter  Medication Sig Dispense Refill  . lisinopril (PRINIVIL,ZESTRIL) 2.5 MG tablet Take 2.5 mg by mouth daily.  0  . metFORMIN (GLUCOPHAGE) 500 MG tablet Take 500 mg by mouth daily.  0  . simvastatin (ZOCOR) 20 MG tablet Take 20 mg by mouth every evening.  0  . sodium bicarbonate 650 MG tablet Take 650 mg by mouth 2 (two) times daily.  0    ROS: Constitutional: No fever or chills Vision: No changes in vision ENT: No difficulty swallowing CV: No chest pain Pulm: No SOB or wheezing GI: No nausea or vomiting GU: No urgency or inability to hold urine Skin: No poor wound healing Neurologic: Baseline numbness to bilateral lower extremities Psychiatric: No depression or anxiety Heme: No bruising Allergic: No reaction to medications or food   Exam: Blood pressure 120/73, pulse 76, temperature 98 F (36.7 C), temperature source Oral, resp. rate 16, height 6\' 2"  (1.88 m), weight 95.3 kg (210 lb), SpO2 98 %. General: No acute distress Orientation: Awake alert and oriented x3 Mood and Affect: Cooperative and pleasant Gait: Not assessed due to his fracture Coordination and balance: Within normal limits to his upper extremities.  Right lower extremity: Reveals ex-fix is in place.  There is mild serosanguineous drainage around the pin sites.  He has an Ace wrap and dressing over his open medial wound.  He wiggles his toes.  He has diminished sensation to pinch.  He does feel light touch.  He is warm well-perfused foot.  No pain with knee or hip range of motion.  Compartments are soft and compressible.  Left lower extremity: Skin without lesions. No tenderness to palpation. Full painless ROM, full strength in each muscle groups  without evidence of instability.   Medical Decision Making: Imaging: X-rays prior to reduction and postreduction are reviewed he has a comminuted Weber B/C fibular fracture with associated medial malleolus fracture.  He is an external fixator from the fluoroscopy images with fixation using cannulated medial mall screws.  Labs:  Results for orders placed or performed during the hospital encounter of 06/08/18 (from the past 24 hour(s))  CDS serology     Status: None   Collection Time: 06/08/18  2:32 PM  Result Value Ref Range   CDS serology specimen      SPECIMEN WILL BE HELD FOR 14 DAYS IF TESTING IS REQUIRED  Comprehensive metabolic panel     Status: Abnormal   Collection Time: 06/08/18  2:32 PM  Result Value Ref Range   Sodium 139 135 - 145 mmol/L   Potassium 4.7 3.5 - 5.1 mmol/L   Chloride 110 98 - 111 mmol/L   CO2 17 (L) 22 - 32 mmol/L   Glucose, Bld 111 (H) 70 - 99 mg/dL   BUN 51 (H) 8 - 23 mg/dL   Creatinine, Ser 4.44 (H) 0.61 - 1.24 mg/dL   Calcium 8.2 (L) 8.9 - 10.3 mg/dL   Total Protein 6.0 (L) 6.5 - 8.1 g/dL   Albumin 3.6 3.5 - 5.0 g/dL   AST 21 15 - 41 U/L   ALT 18 0 - 44 U/L   Alkaline Phosphatase 54 38 - 126 U/L   Total Bilirubin 0.6 0.3 - 1.2 mg/dL   GFR calc non Af Amer 13 (L) >60 mL/min   GFR calc Af Amer 14 (L) >60 mL/min   Anion gap 12 5 - 15  CBC     Status: Abnormal   Collection Time: 06/08/18  2:32 PM  Result Value Ref Range   WBC 5.0 4.0 - 10.5 K/uL   RBC 2.92 (L) 4.22 - 5.81 MIL/uL   Hemoglobin 9.0 (L) 13.0 - 17.0 g/dL   HCT 28.8 (L) 39.0 - 52.0 %   MCV 98.6 78.0 - 100.0 fL   MCH 30.8 26.0 - 34.0 pg   MCHC 31.3 30.0 - 36.0 g/dL   RDW 12.7 11.5 - 15.5 %   Platelets 195 150 - 400 K/uL  Ethanol     Status: None   Collection Time: 06/08/18  2:32 PM  Result Value Ref Range   Alcohol, Ethyl (B) <10 <10 mg/dL  Protime-INR     Status: None   Collection Time: 06/08/18  2:32 PM  Result Value Ref Range   Prothrombin Time 13.3 11.4 - 15.2 seconds   INR  1.01   Sample to Blood Bank     Status: None   Collection Time: 06/08/18  2:59 PM  Result Value Ref Range   Blood Bank Specimen SAMPLE AVAILABLE FOR TESTING    Sample Expiration      06/09/2018 Performed at [facility], [address] [place], [place] [address]   I-Stat CG4 Lactic Acid, ED     Status: Abnormal   Collection Time: 06/08/18  3:00 PM  Result  Value Ref Range   Lactic Acid, Venous 2.50 (HH) 0.5 - 1.9 mmol/L   Comment NOTIFIED PHYSICIAN   I-Stat Chem 8, ED     Status: Abnormal   Collection Time: 06/08/18  3:02 PM  Result Value Ref Range   Sodium 140 135 - 145 mmol/L   Potassium 4.6 3.5 - 5.1 mmol/L   Chloride 108 98 - 111 mmol/L   BUN 49 (H) 8 - 23 mg/dL   Creatinine, Ser 4.60 (H) 0.61 - 1.24 mg/dL   Glucose, Bld 112 (H) 70 - 99 mg/dL   Calcium, Ion 1.07 (L) 1.15 - 1.40 mmol/L   TCO2 18 (L) 22 - 32 mmol/L   Hemoglobin 9.2 (L) 13.0 - 17.0 g/dL   HCT 27.0 (L) 39.0 - 52.0 %  HIV antibody (Routine Testing)     Status: None   Collection Time: 06/08/18  4:07 PM  Result Value Ref Range   HIV Screen 4th Generation wRfx Non Reactive Non Reactive  CK     Status: None   Collection Time: 06/08/18  4:07 PM  Result Value Ref Range   Total CK 356 49 - 397 U/L  Urinalysis, Routine w reflex microscopic     Status: Abnormal   Collection Time: 06/08/18  4:40 PM  Result Value Ref Range   Color, Urine STRAW (A) YELLOW   APPearance CLEAR CLEAR   Specific Gravity, Urine 1.010 1.005 - 1.030   pH 5.0 5.0 - 8.0   Glucose, UA NEGATIVE NEGATIVE mg/dL   Hgb urine dipstick SMALL (A) NEGATIVE   Bilirubin Urine NEGATIVE NEGATIVE   Ketones, ur NEGATIVE NEGATIVE mg/dL   Protein, ur NEGATIVE NEGATIVE mg/dL   Nitrite NEGATIVE NEGATIVE   Leukocytes, UA TRACE (A) NEGATIVE   RBC / HPF 0-5 0 - 5 RBC/hpf   WBC, UA 6-10 0 - 5 WBC/hpf   Bacteria, UA RARE (A) NONE SEEN   Mucus PRESENT   Glucose, capillary     Status: None   Collection Time: 06/08/18  7:13 PM  Result Value Ref Range    Glucose-Capillary 97 70 - 99 mg/dL  Lactic acid, plasma     Status: None   Collection Time: 06/08/18  8:33 PM  Result Value Ref Range   Lactic Acid, Venous 1.4 0.5 - 1.9 mmol/L  Glucose, capillary     Status: Abnormal   Collection Time: 06/08/18  9:13 PM  Result Value Ref Range   Glucose-Capillary 132 (H) 70 - 99 mg/dL  Basic metabolic panel     Status: Abnormal   Collection Time: 06/09/18  3:34 AM  Result Value Ref Range   Sodium 137 135 - 145 mmol/L   Potassium 5.8 (H) 3.5 - 5.1 mmol/L   Chloride 110 98 - 111 mmol/L   CO2 17 (L) 22 - 32 mmol/L   Glucose, Bld 182 (H) 70 - 99 mg/dL   BUN 48 (H) 8 - 23 mg/dL   Creatinine, Ser 3.94 (H) 0.61 - 1.24 mg/dL   Calcium 7.6 (L) 8.9 - 10.3 mg/dL   GFR calc non Af Amer 14 (L) >60 mL/min   GFR calc Af Amer 17 (L) >60 mL/min   Anion gap 10 5 - 15  CBC     Status: Abnormal   Collection Time: 06/09/18  3:34 AM  Result Value Ref Range   WBC 8.7 4.0 - 10.5 K/uL   RBC 2.86 (L) 4.22 - 5.81 MIL/uL   Hemoglobin 8.9 (L) 13.0 - 17.0 g/dL   HCT  28.4 (L) 39.0 - 52.0 %   MCV 99.3 78.0 - 100.0 fL   MCH 31.1 26.0 - 34.0 pg   MCHC 31.3 30.0 - 36.0 g/dL   RDW 12.8 11.5 - 15.5 %   Platelets 197 150 - 400 K/uL  Glucose, capillary     Status: Abnormal   Collection Time: 06/09/18  6:39 AM  Result Value Ref Range   Glucose-Capillary 147 (H) 70 - 99 mg/dL    Medical history and chart was reviewed  Assessment/Plan: 67 year old male with a history of type 2 diabetes on metformin, inflammatory polyneuropathy and chronic kidney disease with acute kidney injury with a type III a open ankle fracture dislocation status post external fixation and ORIF of medial malleolus  I discussed with the patient the need for return to the operating room for repeat I&D and likely ORIF of fibula and revision fixation of his medial malleolus.  He has a significantly high risk of postoperative complications due to his neuropathy as his large open fracture wound.  I discussed with  him risks and benefits of proceeding with surgical fixation.  Risks discussed included bleeding requiring blood transfusion, bleeding causing a hematoma, infection, malunion, nonunion, damage to surrounding nerves and blood vessels, pain, hardware prominence or irritation, hardware failure, stiffness, post-traumatic arthritis, DVT/PE, compartment syndrome, and even death. Patient agreed to proceed with surgery and will plan for surgery later this week either Thursday or Friday.  I will take his dressing down tomorrow to evaluate his wound is swelling.  I will obtain x-rays of his ankle today along with a CT scan for further surgical planning.  He may mobilize with physical therapy maintaining nonweightbearing precautions to his right lower extremity.  Will change Ancef to ceftriaxone for type III a open surgical prophylaxis.   Will defer to the family medicine team for treatment regarding his acute kidney injury.  Shona Needles, MD [facility] [phone] (phone)

## 2018-06-09 NOTE — Progress Notes (Addendum)
Family Medicine Teaching Service Daily Progress Note Intern Pager: [phone]  Patient name: [name] Medical record number: [id number] Date of birth: 1951-05-28 Age: 67 y.o. Gender: male  Primary Care Provider: Patient, No Pcp Per Consultants: Ortho, Ortho Trauma Code Status: Full Code  Pt Overview and Major Events to Date:  7/9 Admitted with open right ankle fracture 7/9 Fracture reduced under sedation in ED 7/9 Irrigation, debridement, ORIF of medical malleolus and external fixation   Assessment and Plan:  [name] is a 67 y.o. male presenting with Open right ankle fracture . PMH is significant for T2DM, CIPD (chronic inflammatroy demyelinating polyneuropathy), HTN, HLD, chronic acidosis, CKD (baseline creatinine ~2.5)  Open Right Ankle Fracture: Sedated in ED and reduced.  No LOC, patient rolled his ankle stepping off ladder, pain controlled in ED. Stable vitals.  S/p 2L bolus, cefazolin, reduction under sedation.  S/P Irrigation and debridement with internal fixation using cannulated screws and placement of spanning external fixator by Ortho.  Ortho reassigned care to ortho trauma due to complex nature of the injury.  Ortho Trauma recommends repeat I&D and likely ORIF of fibular and reivison fixation of medical mallelous.  X-rays and CT ordered today for surgical planning.  Also recommended starting Rocephin for surgical prophylaxis.  Patient still continues to deny complaints, states pain is well-controlled.  -ortho consulted, surgical management appreciated - f/u ortho trauma recommendations for surgery - surgery 7/11 or 7/12 -Lovenox started per Ortho -pt/ot   AKI on chronic CKD w/ chronic acidosis: Improving.  Patient is a patient at [facility] in [place], [place].  ED physician states that he contacted his PCP's office and was told that in June Cr was 2.3.  In E.D., patient's Cr 4.44, with repeat 30 minutes later 4.60.  Patient notes that he has been working outside in  the sun all week.  On 650 bicarb BID at home.  Suspect pre-renal. S/p 2L NS bolus in ED and 1 L overnight.  NS currently at [facility].  Cr improving 3.94 on 7/10,increased to 4.26 this AM. -holding metformin/lisinopril -additional 150/hr NS infusion for 10hrs today -f/u BMP in this afternoon -avoid nephrotoxic agents -continue bicarb  Lactic acidosis: Improving.  2.5 on admission, suspect potential metformin acidosis in setting of recent dehydration.  Patient does not appear infections via vitals/exam.  S/P 2L bolus and IL overnight.  LA on 7/9 at 2033, 1.4. -additional IVF per AKI -hold metformin   Hyperkalemia, Resolved: K 5.8 on 7/10, repeat 5.3.  This AM, 4.3. - continue to monitor BMP in AM - Fluids per AKI - consider Valtesa as needed.  T2DM: on Metformin 500mg  BID -sSSI -hold metformin, will not continue as outpatient due to CKD  HLD: Simvastatin 20mg   HTN: Lisinopril 2.5mg  QD -holding lisinopril in setting of AKI  Hypocalcemia- chronic, on Vit D 1000 at home -tums BID -home Vitamin D  History of Chronic inflammatory demyelinating polyneuropathy with chronic foot drop: Patient claims in 2012 started with significant flare that was treated and cleared a few years later.  Says no residual deficits but a slight foot drop on right (which he states could also be impacted from trauma to lateral knee).  Has multiple fractures in toes/foot on right from this issue. Distal sensation still baseline per patient. -PT/OT will eval post surgery    FEN/GI: carb modified diet  PPx: Lovenox per Ortho  Disposition: Home  Subjective:  Denies any current complaints.  Good pain control.   Objective: Temp:  [98.2 F (  36.8 C)-98.6 F (37 C)] 98.6 F (37 C) (07/11 0541) Pulse Rate:  [79-87] 80 (07/11 0541) Resp:  [16-19] 18 (07/11 0541) BP: (107-136)/(61-76) 135/76 (07/11 0541) SpO2:  [98 %-99 %] 98 % (07/11 0541)   Physical Exam: General: 67 yo male sitting in bed, in  NAD  Cardiovascular: RRR no m/r/g  Respiratory: CTAB no wheezes, no crackles  Abdomen: Soft, non-tender, +bowel sounds   Extremities: external rod on anterior of right leg with ace wrap from toes to knee, intact sensation proximal and distal to ankle, no edema LLE, compression sock on LLE  Laboratory: Recent Labs  Lab 06/08/18 1432 06/08/18 1502 06/09/18 0334 06/10/18 0305  WBC 5.0  --  8.7 7.0  HGB 9.0* 9.2* 8.9* 7.5*  HCT 28.8* 27.0* 28.4* 24.1*  PLT 195  --  197 174   Recent Labs  Lab 06/08/18 1432  06/09/18 0334 06/09/18 0726 06/10/18 0305  NA 139   < > 137 137 139  K 4.7   < > 5.8* 5.3* 4.3  CL 110   < > 110 107 108  CO2 17*  --  17* 19* 21*  BUN 51*   < > 48* 48* 51*  CREATININE 4.44*   < > 3.94* 4.06* 4.26*  CALCIUM 8.2*  --  7.6* 7.7* 7.7*  PROT 6.0*  --   --   --   --   BILITOT 0.6  --   --   --   --   ALKPHOS 54  --   --   --   --   ALT 18  --   --   --   --   AST 21  --   --   --   --   GLUCOSE 111*   < > 182* 157* 114*   < > = values in this interval not displayed.     Imaging/Diagnostic Tests: Ct Ankle Right Wo Contrast  Result Date: 06/09/2018 CLINICAL DATA:  Ankle fracture post external fixation and ORIF. EXAM: CT OF THE RIGHT ANKLE WITHOUT CONTRAST TECHNIQUE: Multidetector CT imaging of the right ankle was performed according to the standard protocol. Multiplanar CT image reconstructions were also generated. COMPARISON:  Radiographs 06/08/2018 and 06/09/2018. FINDINGS: Bones/Joint/Cartilage External fixators are present within the distal tibia and calcaneal body. There is near anatomic reduction of the transverse fracture through the base of the medial malleolus status post ORIF with 2 cortical screws. There are comminuted and mildly displaced intra-articular fractures involving the distal tibia laterally. Anterior component demonstrates up to 5 mm of anterior displacement. There is a minimally displaced posterior component. There is extension into the  distal tibiofibular articulation. There is a comminuted and nearly nondisplaced fracture of the distal fibula, extending from the diaphysis into the lateral malleolus. The distal tibiofibular joint is mildly widened. There are multiple fracture fragments interposed between the tibial plafond and the talar dome, largest measuring 9 mm (image 98/4). The talus and calcaneus are intact. There are advanced midfoot degenerative changes, predominately at the articulation between the navicular and lateral cuneiform and throughout the Lisfranc joint. No acute osseous findings are seen in the midfoot. Ligaments Suboptimally assessed by CT. The distal lateral ankle ligaments appear grossly intact. Muscles and Tendons The ankle tendons appear intact and normally located. There are scattered air bubbles within the soft tissues surrounding the ankle, and some of these may be within the medial and peroneal tendon sheaths. Soft tissues Generalized soft tissue edema surrounding the ankle, greatest  laterally. As above, there are scattered air bubbles within the soft tissues. No large hematoma identified. IMPRESSION: 1. Near anatomic reduction of transverse fracture involving the base of the medial malleolus post ORIF. 2. Comminuted and displaced intra-articular fracture the distal tibial laterally, extending into the tibiofibular joint. There are multiple fracture fragments interposed between the tibial plafond and talar dome. 3. Near anatomic reduction of comminuted fracture of the distal fibula. 4. No evidence of tarsal bone fracture. 5. Age advanced midfoot degenerative changes. Electronically Signed   By: Richardean Sale M.D.   On: 06/09/2018 09:37    Rittberger, Bernita Raisin, DO 06/10/2018, 9:17 AM PGY-1, [place] Intern pager: [phone], text pages welcome

## 2018-06-09 NOTE — Evaluation (Signed)
Occupational Therapy Evaluation Patient Details Name: [name] MRN: [id number] DOB: [birth year] Today's Date: 06/09/2018    History of Present Illness Pt is a 67 y/o male admitted after falling from ladder status post a type III a open ankle fracture dislocation status post external fixation and ORIF of medial malleolus.  Per Dr. Verlon Setting plans to return to the operating room for repeat I&D and likely ORIF of fibula and revision fixation of his medial malleolus.  PMH significant for but not limited to: DM2, CKD, CIDP.    Clinical Impression   PTA patient was independent with ADL, IADL and mobility.  He currently requires setup assist for UB ADL, min assist for LB ADL (lateral leans in sitting), min assist for toilet transfers using RW to [facility], SU assist for toileting seated, and SU assist for grooming seated.  Patient educated on precautions, safety, and compensatory techniques with ADL participation. He will benefit from skilled OT services acutely in order to address listed deficits (see problem list below) and to maximize safety and independence with ADL participation prior to dc home with family support 24/7.  Anticipate patient will progress well and not need OT services after dc. Will continue to follow while admitted.     Follow Up Recommendations  No OT follow up;Supervision/Assistance - 24 hour    Equipment Recommendations  3 in 1 bedside commode    Recommendations for Other Services PT consult     Precautions / Restrictions Precautions Precautions: Fall Required Braces or Orthoses: Other Brace/Splint Other Brace/Splint: external fixator to R ankle Restrictions Weight Bearing Restrictions: Yes RLE Weight Bearing: Non weight bearing      Mobility Bed Mobility Overal bed mobility: Needs Assistance Bed Mobility: Supine to Sit     Supine to sit: Supervision     General bed mobility comments: HOB elevated and use of rails  Transfers Overall transfer level: Needs  assistance   Transfers: Sit to/from Stand;Stand Pivot Transfers Sit to Stand: Min assist;+2 safety/equipment Stand pivot transfers: Min assist;+2 safety/equipment       General transfer comment: cueing for technique, hand positioning and safety    Balance Overall balance assessment: Needs assistance Sitting-balance support: Feet unsupported;No upper extremity supported( L LE Only) Sitting balance-Leahy Scale: Fair     Standing balance support: Bilateral upper extremity supported;During functional activity Standing balance-Leahy Scale: Fair Standing balance comment: reliant on B UE support                           ADL either performed or assessed with clinical judgement   ADL Overall ADL's : Needs assistance/impaired Eating/Feeding: Modified independent;Sitting   Grooming: Set up;Sitting   Upper Body Bathing: Set up;Sitting   Lower Body Bathing: Minimal assistance;Sitting/lateral leans   Upper Body Dressing : Set up;Sitting   Lower Body Dressing: Sitting/lateral leans;Minimal assistance   Toilet Transfer: Minimal assistance;+2 for safety/equipment;BSC;RW Toilet Transfer Details (indicate cue type and reason): cueing for hand placement and safety, impulsive at times Toileting- Clothing Manipulation and Hygiene: Set up;Sitting/lateral lean     Tub/Shower Transfer Details (indicate cue type and reason): not yet assessed Functional mobility during ADLs: Minimal assistance;+2 for safety/equipment;Rolling walker General ADL Comments: highly motivated, strong UEs and good maintaince of NWB to R LE      Vision Baseline Vision/History: Wears glasses Wears Glasses: Reading only Patient Visual Report: No change from baseline Vision Assessment?: No apparent visual deficits     Perception     Praxis  Pertinent Vitals/Pain Pain Assessment: Faces Faces Pain Scale: Hurts little more Pain Location: R ankle Pain Descriptors / Indicators: Constant;Operative  site guarding Pain Intervention(s): Limited activity within patient's tolerance;Monitored during session;Repositioned     Hand Dominance     Extremity/Trunk Assessment Upper Extremity Assessment Upper Extremity Assessment: Overall WFL for tasks assessed   Lower Extremity Assessment Lower Extremity Assessment: Defer to PT evaluation(external fixator to R ankle)   Cervical / Trunk Assessment Cervical / Trunk Assessment: Normal   Communication Communication Communication: No difficulties   Cognition Arousal/Alertness: Awake/alert Behavior During Therapy: WFL for tasks assessed/performed Overall Cognitive Status: Within Functional Limits for tasks assessed                                     General Comments       Exercises     Shoulder Instructions      Home Living Family/patient expects to be discharged to:: Private residence Living Arrangements: Other relatives(friend, son) Available Help at Discharge: Family;Friend(s);Available 24 hours/day Type of Home: House Home Access: Stairs to enter [employer] of Steps: 2-3 Entrance Stairs-Rails: None Home Layout: One level     Bathroom Shower/Tub: Teacher, early years/pre: Standard Bathroom Accessibility: Yes How Accessible: Accessible via walker Home Equipment: Crutches;Tub bench          Prior Functioning/Environment Level of Independence: Independent        Comments: independent with ADL, IADL, mobility; retired        OT Problem List: Decreased activity tolerance;Impaired balance (sitting and/or standing);Decreased safety awareness;Decreased knowledge of use of DME or AE;Decreased knowledge of precautions;Pain      OT Treatment/Interventions: Self-care/ADL training;Energy conservation;DME and/or AE instruction;Therapeutic activities;Patient/family education;Balance training    OT Goals(Current goals can be found in the care plan section) Acute Rehab OT Goals Patient  Stated Goal: to get home  OT Goal Formulation: With patient Time For Goal Achievement: 06/23/18 Potential to Achieve Goals: Good  OT Frequency: Min 2X/week   Barriers to D/C:            Co-evaluation              AM-PAC PT "6 Clicks" Daily Activity     Outcome Measure Help from another person eating meals?: None Help from another person taking care of personal grooming?: A Little Help from another person toileting, which includes using toliet, bedpan, or urinal?: A Little Help from another person bathing (including washing, rinsing, drying)?: A Little Help from another person to put on and taking off regular upper body clothing?: None Help from another person to put on and taking off regular lower body clothing?: A Little 6 Click Score: 20   End of Session Equipment Utilized During Treatment: Gait belt;Rolling walker Nurse Communication: Mobility status  Activity Tolerance: Patient tolerated treatment well Patient left: in chair;with call bell/phone within reach;with chair alarm set;with nursing/sitter in room  OT Visit Diagnosis: Other abnormalities of gait and mobility (R26.89);Pain Pain - Right/Left: Right Pain - part of body: Ankle and joints of foot                Time: 1430-1502 OT Time Calculation (min): 32 min Charges:  OT General Charges $OT Visit: 1 Visit OT Evaluation $OT Eval Moderate Complexity: 1 Mod OT Treatments $Self Care/Home Management : 8-22 mins G-Codes:     Delight Stare, OTR/L  Pager [place]  Alayzia Pavlock 06/09/2018, 3:24 PM

## 2018-06-09 NOTE — H&P (View-Only) (Signed)
Orthopaedic Trauma Service (OTS) Consult   Patient ID: [name] MRN: [id number] DOB/AGE: 68-Jan-1952 68 y.o.  Reason for Consult:Right open ankle fracture dislocation Referring Physician: Dr. Netta Cedars, MD [facility]  HPI: [name] is an 67 y.o. male who is being seen in consultation at the request of Dr. Veverly Fells for evaluation of right open ankle fracture dislocation.  The patient was doing yard work, when he was on a ladder.  He was taking his tools down where he slipped fell landed on his foot had immediate pain deformity and open wound.  He was brought to the emergency room where he had an open fracture dislocation.  He was reduced provisionally and taken to the operating room last night for irrigation debridement ORIF his medial malleolus and external fixation by Dr. Veverly Fells.  I was asked to consult due to the complexity of his injury and need for an orthopedic traumatologist.  Patient lives alone.  However he lives on a one-story as of 3 steps to get into his house.  He at baseline walks without an assistive device.  He has some sons and family in town that can assist with him post discharge.  He has chronic inflammatory neuropathy with a diminished pain sensation to his bilateral lower extremities.  He also has a previous peroneal nerve injury from an accident years ago which she had some residual foot drop.  He also had a midfoot fracture years ago that causes him to have some forefoot valgus.  He has received Ancef postoperatively for surgical prophylaxis and open wound prophylaxis.  Denies any other injuries.  He does note that he was dehydrated during this week doing all his work and his chronic kidney disease had increased in his creatinine.  Medical history: Chronic kidney disease, polyneuropathy, type 2 diabetes without insulin dependence  History reviewed. No pertinent surgical history.  History reviewed. No pertinent family history.  Social History:  reports that he has never  smoked. He has never used smokeless tobacco. His alcohol and drug histories are not on file.  Allergies: No Known Allergies  Medications:  No current facility-administered medications on file prior to encounter.    Current Outpatient Medications on File Prior to Encounter  Medication Sig Dispense Refill  . lisinopril (PRINIVIL,ZESTRIL) 2.5 MG tablet Take 2.5 mg by mouth daily.  0  . metFORMIN (GLUCOPHAGE) 500 MG tablet Take 500 mg by mouth daily.  0  . simvastatin (ZOCOR) 20 MG tablet Take 20 mg by mouth every evening.  0  . sodium bicarbonate 650 MG tablet Take 650 mg by mouth 2 (two) times daily.  0    ROS: Constitutional: No fever or chills Vision: No changes in vision ENT: No difficulty swallowing CV: No chest pain Pulm: No SOB or wheezing GI: No nausea or vomiting GU: No urgency or inability to hold urine Skin: No poor wound healing Neurologic: Baseline numbness to bilateral lower extremities Psychiatric: No depression or anxiety Heme: No bruising Allergic: No reaction to medications or food   Exam: Blood pressure 120/73, pulse 76, temperature 98 F (36.7 C), temperature source Oral, resp. rate 16, height 6\' 2"  (1.88 m), weight 95.3 kg (210 lb), SpO2 98 %. General: No acute distress Orientation: Awake alert and oriented x3 Mood and Affect: Cooperative and pleasant Gait: Not assessed due to his fracture Coordination and balance: Within normal limits to his upper extremities.  Right lower extremity: Reveals ex-fix is in place.  There is mild serosanguineous drainage around the pin sites.  He has an Ace wrap and dressing over his open medial wound.  He wiggles his toes.  He has diminished sensation to pinch.  He does feel light touch.  He is warm well-perfused foot.  No pain with knee or hip range of motion.  Compartments are soft and compressible.  Left lower extremity: Skin without lesions. No tenderness to palpation. Full painless ROM, full strength in each muscle groups  without evidence of instability.   Medical Decision Making: Imaging: X-rays prior to reduction and postreduction are reviewed he has a comminuted Weber B/C fibular fracture with associated medial malleolus fracture.  He is an external fixator from the fluoroscopy images with fixation using cannulated medial mall screws.  Labs:  Results for orders placed or performed during the hospital encounter of 06/08/18 (from the past 24 hour(s))  CDS serology     Status: None   Collection Time: 06/08/18  2:32 PM  Result Value Ref Range   CDS serology specimen      SPECIMEN WILL BE HELD FOR 14 DAYS IF TESTING IS REQUIRED  Comprehensive metabolic panel     Status: Abnormal   Collection Time: 06/08/18  2:32 PM  Result Value Ref Range   Sodium 139 135 - 145 mmol/L   Potassium 4.7 3.5 - 5.1 mmol/L   Chloride 110 98 - 111 mmol/L   CO2 17 (L) 22 - 32 mmol/L   Glucose, Bld 111 (H) 70 - 99 mg/dL   BUN 51 (H) 8 - 23 mg/dL   Creatinine, Ser 4.44 (H) 0.61 - 1.24 mg/dL   Calcium 8.2 (L) 8.9 - 10.3 mg/dL   Total Protein 6.0 (L) 6.5 - 8.1 g/dL   Albumin 3.6 3.5 - 5.0 g/dL   AST 21 15 - 41 U/L   ALT 18 0 - 44 U/L   Alkaline Phosphatase 54 38 - 126 U/L   Total Bilirubin 0.6 0.3 - 1.2 mg/dL   GFR calc non Af Amer 13 (L) >60 mL/min   GFR calc Af Amer 14 (L) >60 mL/min   Anion gap 12 5 - 15  CBC     Status: Abnormal   Collection Time: 06/08/18  2:32 PM  Result Value Ref Range   WBC 5.0 4.0 - 10.5 K/uL   RBC 2.92 (L) 4.22 - 5.81 MIL/uL   Hemoglobin 9.0 (L) 13.0 - 17.0 g/dL   HCT 28.8 (L) 39.0 - 52.0 %   MCV 98.6 78.0 - 100.0 fL   MCH 30.8 26.0 - 34.0 pg   MCHC 31.3 30.0 - 36.0 g/dL   RDW 12.7 11.5 - 15.5 %   Platelets 195 150 - 400 K/uL  Ethanol     Status: None   Collection Time: 06/08/18  2:32 PM  Result Value Ref Range   Alcohol, Ethyl (B) <10 <10 mg/dL  Protime-INR     Status: None   Collection Time: 06/08/18  2:32 PM  Result Value Ref Range   Prothrombin Time 13.3 11.4 - 15.2 seconds   INR  1.01   Sample to Blood Bank     Status: None   Collection Time: 06/08/18  2:59 PM  Result Value Ref Range   Blood Bank Specimen SAMPLE AVAILABLE FOR TESTING    Sample Expiration      06/09/2018 Performed at [facility], [address] [place], [place] [address]   I-Stat CG4 Lactic Acid, ED     Status: Abnormal   Collection Time: 06/08/18  3:00 PM  Result  Value Ref Range   Lactic Acid, Venous 2.50 (HH) 0.5 - 1.9 mmol/L   Comment NOTIFIED PHYSICIAN   I-Stat Chem 8, ED     Status: Abnormal   Collection Time: 06/08/18  3:02 PM  Result Value Ref Range   Sodium 140 135 - 145 mmol/L   Potassium 4.6 3.5 - 5.1 mmol/L   Chloride 108 98 - 111 mmol/L   BUN 49 (H) 8 - 23 mg/dL   Creatinine, Ser 4.60 (H) 0.61 - 1.24 mg/dL   Glucose, Bld 112 (H) 70 - 99 mg/dL   Calcium, Ion 1.07 (L) 1.15 - 1.40 mmol/L   TCO2 18 (L) 22 - 32 mmol/L   Hemoglobin 9.2 (L) 13.0 - 17.0 g/dL   HCT 27.0 (L) 39.0 - 52.0 %  HIV antibody (Routine Testing)     Status: None   Collection Time: 06/08/18  4:07 PM  Result Value Ref Range   HIV Screen 4th Generation wRfx Non Reactive Non Reactive  CK     Status: None   Collection Time: 06/08/18  4:07 PM  Result Value Ref Range   Total CK 356 49 - 397 U/L  Urinalysis, Routine w reflex microscopic     Status: Abnormal   Collection Time: 06/08/18  4:40 PM  Result Value Ref Range   Color, Urine STRAW (A) YELLOW   APPearance CLEAR CLEAR   Specific Gravity, Urine 1.010 1.005 - 1.030   pH 5.0 5.0 - 8.0   Glucose, UA NEGATIVE NEGATIVE mg/dL   Hgb urine dipstick SMALL (A) NEGATIVE   Bilirubin Urine NEGATIVE NEGATIVE   Ketones, ur NEGATIVE NEGATIVE mg/dL   Protein, ur NEGATIVE NEGATIVE mg/dL   Nitrite NEGATIVE NEGATIVE   Leukocytes, UA TRACE (A) NEGATIVE   RBC / HPF 0-5 0 - 5 RBC/hpf   WBC, UA 6-10 0 - 5 WBC/hpf   Bacteria, UA RARE (A) NONE SEEN   Mucus PRESENT   Glucose, capillary     Status: None   Collection Time: 06/08/18  7:13 PM  Result Value Ref Range    Glucose-Capillary 97 70 - 99 mg/dL  Lactic acid, plasma     Status: None   Collection Time: 06/08/18  8:33 PM  Result Value Ref Range   Lactic Acid, Venous 1.4 0.5 - 1.9 mmol/L  Glucose, capillary     Status: Abnormal   Collection Time: 06/08/18  9:13 PM  Result Value Ref Range   Glucose-Capillary 132 (H) 70 - 99 mg/dL  Basic metabolic panel     Status: Abnormal   Collection Time: 06/09/18  3:34 AM  Result Value Ref Range   Sodium 137 135 - 145 mmol/L   Potassium 5.8 (H) 3.5 - 5.1 mmol/L   Chloride 110 98 - 111 mmol/L   CO2 17 (L) 22 - 32 mmol/L   Glucose, Bld 182 (H) 70 - 99 mg/dL   BUN 48 (H) 8 - 23 mg/dL   Creatinine, Ser 3.94 (H) 0.61 - 1.24 mg/dL   Calcium 7.6 (L) 8.9 - 10.3 mg/dL   GFR calc non Af Amer 14 (L) >60 mL/min   GFR calc Af Amer 17 (L) >60 mL/min   Anion gap 10 5 - 15  CBC     Status: Abnormal   Collection Time: 06/09/18  3:34 AM  Result Value Ref Range   WBC 8.7 4.0 - 10.5 K/uL   RBC 2.86 (L) 4.22 - 5.81 MIL/uL   Hemoglobin 8.9 (L) 13.0 - 17.0 g/dL   HCT  28.4 (L) 39.0 - 52.0 %   MCV 99.3 78.0 - 100.0 fL   MCH 31.1 26.0 - 34.0 pg   MCHC 31.3 30.0 - 36.0 g/dL   RDW 12.8 11.5 - 15.5 %   Platelets 197 150 - 400 K/uL  Glucose, capillary     Status: Abnormal   Collection Time: 06/09/18  6:39 AM  Result Value Ref Range   Glucose-Capillary 147 (H) 70 - 99 mg/dL    Medical history and chart was reviewed  Assessment/Plan: 67 year old male with a history of type 2 diabetes on metformin, inflammatory polyneuropathy and chronic kidney disease with acute kidney injury with a type III a open ankle fracture dislocation status post external fixation and ORIF of medial malleolus  I discussed with the patient the need for return to the operating room for repeat I&D and likely ORIF of fibula and revision fixation of his medial malleolus.  He has a significantly high risk of postoperative complications due to his neuropathy as his large open fracture wound.  I discussed with  him risks and benefits of proceeding with surgical fixation.  Risks discussed included bleeding requiring blood transfusion, bleeding causing a hematoma, infection, malunion, nonunion, damage to surrounding nerves and blood vessels, pain, hardware prominence or irritation, hardware failure, stiffness, post-traumatic arthritis, DVT/PE, compartment syndrome, and even death. Patient agreed to proceed with surgery and will plan for surgery later this week either Thursday or Friday.  I will take his dressing down tomorrow to evaluate his wound is swelling.  I will obtain x-rays of his ankle today along with a CT scan for further surgical planning.  He may mobilize with physical therapy maintaining nonweightbearing precautions to his right lower extremity.  Will change Ancef to ceftriaxone for type III a open surgical prophylaxis.   Will defer to the family medicine team for treatment regarding his acute kidney injury.  Shona Needles, MD [facility] [phone] (phone)

## 2018-06-09 NOTE — Progress Notes (Signed)
Orthopedics Progress Note  Subjective: Patient comfortable this AM  Objective:  Vitals:   06/09/18 0030 06/09/18 0514  BP: 138/80 120/73  Pulse: 70 76  Resp: 16 16  Temp: 97.8 F (36.6 C) 98 F (36.7 C)  SpO2: 95% 98%    General: Awake and alert  Musculoskeletal: X Fix in place. Foot in neutral position. Able to wiggle toes.  Sensation intact Neurovascularly intact  Lab Results  Component Value Date   WBC 8.7 06/09/2018   HGB 8.9 (L) 06/09/2018   HCT 28.4 (L) 06/09/2018   MCV 99.3 06/09/2018   PLT 197 06/09/2018       Component Value Date/Time   NA 137 06/09/2018 0334   K 5.8 (H) 06/09/2018 0334   CL 110 06/09/2018 0334   CO2 17 (L) 06/09/2018 0334   GLUCOSE 182 (H) 06/09/2018 0334   BUN 48 (H) 06/09/2018 0334   CREATININE 3.94 (H) 06/09/2018 0334   CALCIUM 7.6 (L) 06/09/2018 0334   GFRNONAA 14 (L) 06/09/2018 0334   GFRAA 17 (L) 06/09/2018 0334    Lab Results  Component Value Date   INR 1.01 06/08/2018    Assessment/Plan: POD #1 s/p Procedure(s): IRRIGATION AND DEBRIDEMENT OPEN TIBIA FRACTURE EXTERNAL FIXATION COMMINUTED FIBULA FRACTURE OPEN REDUCTION INTERNAL FIXATION (ORIF) MEDIAL MALLEOLUS Elevate right leg. Strict NWB Up with PT Dr Doreatha Martin to take over and manage at this point - thanks  Doran Heater. Veverly Fells, MD 06/09/2018 7:01 AM

## 2018-06-09 NOTE — Progress Notes (Signed)
Family Medicine Teaching Service Daily Progress Note Intern Pager: [phone]  Patient name: [name] Medical record number: [id number] Date of birth: [birth year] Age: 67 y.o. Gender: male  Primary Care Provider: No primary care provider on file. Consultants: Ortho, Ortho Trauma Code Status: Full Code  Pt Overview and Major Events to Date:  7/9 Admitted with open right ankle fracture 7/9 Fracture reduced under sedation in ED 7/9 Irrigation, debridement, ORIF of medical malleolus and external fixation   Assessment and Plan:  [name] is a 67 y.o. male presenting with Open right ankle fracture . PMH is significant for T2DM, CIPD (chronic inflammatroy demyelinating polyneuropathy), HTN, HLD, chronic acidosis, CKD (baseline creatinine ~2.5)  Open Right Ankle Fracture: Sedated in ED and reduced.  Ortho taking to surgery now.  No LOC, patient rolled his ankle stepping off ladder, pain controlled in ED. Stable vitals.  S/p 2L bolus, cefazolin, reduction under sedation.  S/P Irrigation and debridement with internal fixation using cannulated screws and placement of spanning external fixator by Ortho.  Ortho reassigned care to ortho trauma due to comples nature of the injury.  Ortho Trauma recommends repeat I&D and likely ORIF of fibular and reivison fixation of medical mallelous.  X-rays and CT ordered today for surgical planning.  Also recommended starting Rocephin for surgical prophylaxis.  Today patient notes that his pain is well controlled.  Reports BM today, no concerns for constipation. -ortho consulted, surgical management appreciated - f/u ortho trauma recommendations - surgery 7/11 or 7/12 -Lovenox started per Ortho -pt/ot -cefazxolin for infection prophylaxis  AKI on chronic CKD w/ chronic acidosis: Improving.  Patient is a patient at [facility] in [place], [place].  ED physician states that he contacted his PCP's office and was told that in June Cr was 2.3.  In E.D., patient's  Cr 4.44, with repeat 30 minutes later 4.60.  Patient notes that he has been working outside in the sun all week.  On 650 bicarb BID at home.  Suspect pre-renal. S/P 2L NS in ED. S/p 2L NS bolus in ED and 1 L overnight.  NS currently at [facility].  Cr improving to 3.94 this AM. -holding metformin/lisinopril -additional 100/hr NS infusion for 10hrs today -f/u BMP in AM -avoid nephrotoxic agents -continue bicarb  Lactic acidosis: Improving.  2.5 on admission, suspect potential metformin acidosis in setting of recent dehydration.  Patient does not appear infections via vitals/exam.  S/P 2L bolus and IL overnight.  LA on 7/9 at 2033, 1.4. -additional IVF per AKI -hold metformin   Hyperkalemia: K 5.8 on 7/10, repeat 5.3.   - F/U BMP in AM - Fluids per AKI - consider Valtesa as needed.  T2DM: on Metformin 500mg  BID -sSSI -hold metformin  HLD: Simvastatin 20mg   HTN: Lisinopril 2.5mg  QD -holding lisinopril in setting of AKI  Hypocalcemia- chronic, on Vit D 1000 at home -tums BID -home Vitamin D  History of Chronic inflammatory demyelinating polyneuropathy with chronic foot drop: Patient claims in 2012 started with significant flare that was treated and cleared a few years later.  Says no residual deficits but a slight foot drop on right (which he states could also be impacted from trauma to lateral knee).  Has multiple fractures in toes/foot on right from this issue. Distal sensation still baseline per patient. -PT/OT will eval post surgery    FEN/GI: carb modified diet  PPx: Lovenox per Ortho  Disposition: Home  Subjective:  Patient denies any current complaints.  States that his pain is well  controlled.   Objective: Temp:  [97.4 F (36.3 C)-98.1 F (36.7 C)] 98 F (36.7 C) (07/10 0514) Pulse Rate:  [65-102] 76 (07/10 0514) Resp:  [9-18] 16 (07/10 0514) BP: (112-171)/(73-101) 120/73 (07/10 0514) SpO2:  [95 %-100 %] 98 % (07/10 0514) Weight:  [210 lb (95.3 kg)] 210 lb (95.3  kg) (07/09 1433)  Physical Exam: General: 67 yo male sitting up in chair in NAD  Cardiovascular: RRR no m/r/g  Respiratory: CTAB no wheezes, crackles  Abdomen: Soft, non-tender, +bowel sounds   Extremities: external rod on anterior of right leg with ace wrap from toes to knee, intact sensation proximal and distal to ankle, no edema LLE  Laboratory: Recent Labs  Lab 06/08/18 1432 06/08/18 1502 06/09/18 0334  WBC 5.0  --  8.7  HGB 9.0* 9.2* 8.9*  HCT 28.8* 27.0* 28.4*  PLT 195  --  197   Recent Labs  Lab 06/08/18 1432 06/08/18 1502 06/09/18 0334 06/09/18 0726  NA 139 140 137 137  K 4.7 4.6 5.8* 5.3*  CL 110 108 110 107  CO2 17*  --  17* 19*  BUN 51* 49* 48* 48*  CREATININE 4.44* 4.60* 3.94* 4.06*  CALCIUM 8.2*  --  7.6* 7.7*  PROT 6.0*  --   --   --   BILITOT 0.6  --   --   --   ALKPHOS 54  --   --   --   ALT 18  --   --   --   AST 21  --   --   --   GLUCOSE 111* 112* 182* 157*     Imaging/Diagnostic Tests: Dg Ankle 2 Views Right  Result Date: 06/08/2018 CLINICAL DATA:  Open right ankle fracture. EXAM: RIGHT ANKLE - 2 VIEW; DG C-ARM 61-120 MIN COMPARISON:  Right ankle x-rays from same day. FLUOROSCOPY TIME:  46 seconds. C-arm fluoroscopic images were obtained intraoperatively and submitted for post operative interpretation. FINDINGS: Multiple intraoperative fluoroscopic images demonstrate interval ORIF medial malleolar fracture with two partially cannulated screws. Unchanged comminuted distal fibular fracture. Ankle mortise is grossly intact. External fixation hardware within the tibia and calcaneus. Diffuse soft tissue swelling about the ankle. IMPRESSION: 1. Postsurgical changes related to interval external fixation and medial malleolar ORIF. 2. Unchanged severely comminuted distal fibular fracture. Electronically Signed   By: Titus Dubin M.D.   On: 06/08/2018 20:17   Dg Ankle Right Port  Result Date: 06/09/2018 CLINICAL DATA:  67 year old male status post open  right ankle fracture, ORIF. EXAM: PORTABLE RIGHT ANKLE - 2 VIEW COMPARISON:  Intraoperative images [id number]. FINDINGS: Portable AP, mortise and lateral views of the right ankle. Stable cannulated screws traversing the medial malleolus fracture. External fixator device in place with severely comminuted distal fibula shaft through lateral malleolus fracture. Abnormal appearance of the distal tarsal bones and TMT joints, with associated sclerosis. Chronic ossific fragment at the anterior talus. IMPRESSION: 1. Stable medial malleolus ORIF. 2. Ex fix in place.  Severe comminution of the distal right fibula. 3. Abnormal visualized distal tarsal bones and TMT joints, but chronic degenerative etiology favored over recent trauma. Electronically Signed   By: Genevie Ann M.D.   On: 06/09/2018 07:52   Dg Ankle Right Port  Result Date: 06/08/2018 CLINICAL DATA:  Patient fell off a ladder and rolled his right ankle. Open fracture. Post Re-Location Image. EXAM: PORTABLE RIGHT ANKLE - 2 VIEW COMPARISON:  None. FINDINGS: Oblique fracture through the medial malleolus which enters articular surface. A comminuted  fracture of the lateral malleolus. Concern for fracture of the tibial plafond. The talar dome appears normal. Ankle mortise grossly intact. On lateral projection there is potential fracture of the posterior aspect of the distal tibia (posterior malleolus). The talocalcaneal joint is narrowed with concern for talus fracture. Boehler's angle appears flattened. Cannot exclude calcaneal fracture. IMPRESSION: 1. Probable trimalleolar fracture.  Ankle mortise is grossly intact. 2. Concern for talus fracture along the talocalcaneal joint 3. Concern for calcaneal fracture. 4. Recommend CT ankle further evaluation of this complex fracture pattern. Electronically Signed   By: Suzy Bouchard M.D.   On: 06/08/2018 15:17   Dg C-arm 1-60 Min  Result Date: 06/08/2018 CLINICAL DATA:  Open right ankle fracture. EXAM: RIGHT ANKLE - 2 VIEW; DG  C-ARM 61-120 MIN COMPARISON:  Right ankle x-rays from same day. FLUOROSCOPY TIME:  46 seconds. C-arm fluoroscopic images were obtained intraoperatively and submitted for post operative interpretation. FINDINGS: Multiple intraoperative fluoroscopic images demonstrate interval ORIF medial malleolar fracture with two partially cannulated screws. Unchanged comminuted distal fibular fracture. Ankle mortise is grossly intact. External fixation hardware within the tibia and calcaneus. Diffuse soft tissue swelling about the ankle. IMPRESSION: 1. Postsurgical changes related to interval external fixation and medial malleolar ORIF. 2. Unchanged severely comminuted distal fibular fracture. Electronically Signed   By: Titus Dubin M.D.   On: 06/08/2018 20:17   Dg C-arm 1-60 Min  Result Date: 06/08/2018 CLINICAL DATA:  Open right ankle fracture. EXAM: RIGHT ANKLE - 2 VIEW; DG C-ARM 61-120 MIN COMPARISON:  Right ankle x-rays from same day. FLUOROSCOPY TIME:  46 seconds. C-arm fluoroscopic images were obtained intraoperatively and submitted for post operative interpretation. FINDINGS: Multiple intraoperative fluoroscopic images demonstrate interval ORIF medial malleolar fracture with two partially cannulated screws. Unchanged comminuted distal fibular fracture. Ankle mortise is grossly intact. External fixation hardware within the tibia and calcaneus. Diffuse soft tissue swelling about the ankle. IMPRESSION: 1. Postsurgical changes related to interval external fixation and medial malleolar ORIF. 2. Unchanged severely comminuted distal fibular fracture. Electronically Signed   By: Titus Dubin M.D.   On: 06/08/2018 20:17    Rittberger, Bernita Raisin, DO 06/09/2018, 9:20 AM PGY-1, [facility] Intern pager: [phone], text pages welcome

## 2018-06-10 LAB — BASIC METABOLIC PANEL
Anion gap: 10 (ref 5–15)
Anion gap: 8 (ref 5–15)
BUN: 51 mg/dL — ABNORMAL HIGH (ref 8–23)
BUN: 48 mg/dL — ABNORMAL HIGH (ref 8–23)
CO2: 21 mmol/L — ABNORMAL LOW (ref 22–32)
CO2: 22 mmol/L (ref 22–32)
Calcium: 7.7 mg/dL — ABNORMAL LOW (ref 8.9–10.3)
Calcium: 8.1 mg/dL — ABNORMAL LOW (ref 8.9–10.3)
Chloride: 108 mmol/L (ref 98–111)
Chloride: 109 mmol/L (ref 98–111)
Creatinine, Ser: 4.26 mg/dL — ABNORMAL HIGH (ref 0.61–1.24)
Creatinine, Ser: 4.01 mg/dL — ABNORMAL HIGH (ref 0.61–1.24)
GFR calc Af Amer: 15 mL/min — ABNORMAL LOW (ref >60)
GFR calc Af Amer: 16 mL/min — ABNORMAL LOW (ref >60)
GFR calc non Af Amer: 13 mL/min — ABNORMAL LOW (ref >60)
GFR calc non Af Amer: 14 mL/min — ABNORMAL LOW (ref >60)
Glucose, Bld: 114 mg/dL — ABNORMAL HIGH (ref 70–99)
Glucose, Bld: 114 mg/dL — ABNORMAL HIGH (ref 70–99)
Potassium: 4.3 mmol/L (ref 3.5–5.1)
Potassium: 4.4 mmol/L (ref 3.5–5.1)
Sodium: 139 mmol/L (ref 135–145)
Sodium: 139 mmol/L (ref 135–145)

## 2018-06-10 LAB — CBC
HCT: 24.1 % — ABNORMAL LOW (ref 39.0–52.0)
Hemoglobin: 7.5 g/dL — ABNORMAL LOW (ref 13.0–17.0)
MCH: 31.4 pg (ref 26.0–34.0)
MCHC: 31.1 g/dL (ref 30.0–36.0)
MCV: 100.8 fL — ABNORMAL HIGH (ref 78.0–100.0)
Platelets: 174 10*3/uL (ref 150–400)
RBC: 2.39 MIL/uL — ABNORMAL LOW (ref 4.22–5.81)
RDW: 13.3 % (ref 11.5–15.5)
WBC: 7 10*3/uL (ref 4.0–10.5)

## 2018-06-10 LAB — GLUCOSE, CAPILLARY
Glucose-Capillary: 101 mg/dL — ABNORMAL HIGH (ref 70–99)
Glucose-Capillary: 105 mg/dL — ABNORMAL HIGH (ref 70–99)
Glucose-Capillary: 111 mg/dL — ABNORMAL HIGH (ref 70–99)
Glucose-Capillary: 95 mg/dL (ref 70–99)

## 2018-06-10 LAB — FRACTIONAL EXCRET-NA(24HR UR +SERUM)
Creatinine, Ser: 4.01 mg/dL — ABNORMAL HIGH (ref 0.61–1.24)
Creatinine, Urine: 46.11 mg/dL
Fractional Excretion of Na: 6 %
Sodium, Ur: 103 mmol/L
Sodium: 138 mmol/L (ref 135–145)

## 2018-06-10 LAB — PREPARE RBC (CROSSMATCH)

## 2018-06-10 LAB — ABO/RH: ABO/RH(D): A POS

## 2018-06-10 MED ORDER — SODIUM CHLORIDE 0.9 % IV SOLN: INTRAVENOUS | Status: AC

## 2018-06-10 NOTE — Progress Notes (Signed)
Orthopaedic Trauma Progress Note  S: Doing well this AM. No complaints. No significant pain in the ankle. Denies chest pain or shortness of breath. Up with therapy yesterday.  O:  Vitals:   06/09/18 2004 06/10/18 0541  BP: 136/76 135/76  Pulse: 79 80  Resp: 16 18  Temp: 98.4 F (36.9 C) 98.6 F (37 C)  SpO2: 99% 98%   Gen: NAD, AAOx3 RLE: Dressing taken down, swelling appropriate laterally with mild skin wrinkling. Traumatic laceration clean, dry and intact. Pin sites clean, dry and intact. Diminished sensation to foot. Active EHL/FHL  Imaging: CT scan and x-rays reviewed. Comminuted fibula fracture with intra-articular fragment in tibiotalar joint  Labs:  Results for orders placed or performed during the hospital encounter of 06/08/18 (from the past 24 hour(s))  Glucose, capillary     Status: Abnormal   Collection Time: 06/09/18 11:39 AM  Result Value Ref Range   Glucose-Capillary 177 (H) 70 - 99 mg/dL  Glucose, capillary     Status: Abnormal   Collection Time: 06/09/18  4:06 PM  Result Value Ref Range   Glucose-Capillary 108 (H) 70 - 99 mg/dL  Glucose, capillary     Status: Abnormal   Collection Time: 06/09/18 10:28 PM  Result Value Ref Range   Glucose-Capillary 103 (H) 70 - 99 mg/dL  Basic metabolic panel     Status: Abnormal   Collection Time: 06/10/18  3:05 AM  Result Value Ref Range   Sodium 139 135 - 145 mmol/L   Potassium 4.3 3.5 - 5.1 mmol/L   Chloride 108 98 - 111 mmol/L   CO2 21 (L) 22 - 32 mmol/L   Glucose, Bld 114 (H) 70 - 99 mg/dL   BUN 51 (H) 8 - 23 mg/dL   Creatinine, Ser 4.26 (H) 0.61 - 1.24 mg/dL   Calcium 7.7 (L) 8.9 - 10.3 mg/dL   GFR calc non Af Amer 13 (L) >60 mL/min   GFR calc Af Amer 15 (L) >60 mL/min   Anion gap 10 5 - 15  CBC     Status: Abnormal   Collection Time: 06/10/18  3:05 AM  Result Value Ref Range   WBC 7.0 4.0 - 10.5 K/uL   RBC 2.39 (L) 4.22 - 5.81 MIL/uL   Hemoglobin 7.5 (L) 13.0 - 17.0 g/dL   HCT 24.1 (L) 39.0 - 52.0 %   MCV  100.8 (H) 78.0 - 100.0 fL   MCH 31.4 26.0 - 34.0 pg   MCHC 31.1 30.0 - 36.0 g/dL   RDW 13.3 11.5 - 15.5 %   Platelets 174 150 - 400 K/uL  Glucose, capillary     Status: Abnormal   Collection Time: 06/10/18  6:05 AM  Result Value Ref Range   Glucose-Capillary 101 (H) 70 - 99 mg/dL    Assessment: 67 year old male s/p fall from ladder  Injuries: Type 3A open right ankle fracture dislocation s/p I&D and external fixation  Plan for OR tomorrow for repeat washout, likely revision fixation medial malleolus and ORIF fibula. NPO past midnight   Weightbearing: NWB RLE  Insicional and dressing care: Dry dressing PRN, changed this AM  Orthopedic device(s):external fixator  CV/Blood loss: Hgb 7.5 this AM, monitor closely. Repeat tomorrow AM, will type and cross for 2 units in anticipation of surgery tomorrow  Pain management: 1. Morphine 2 mg q2hr PRN 2. Oxycodone 5-15 mg q4hrs PRN 3. Robaxin 500mg  q 6hr PRN  VTE prophylaxis: Lovenox 40 mg  ID: Ceftriaxone for open fracture  prophylaxis x48 hrs  Foley/Lines: IVF per family medicine team  Medical co-morbidities: 1. AKI on chronic CKD: 4.26 this AM, IVF per family medicine team and holding nephrotoxic agents 2. T2DM: holding metformin continue SSI  Impediments to Fracture Healing: Osteoporosis, neuropathy, diabetes  Dispo: TBD, PT/OT to evaluate  Follow - up plan: TBD   Shona Needles, MD [facility] [phone] (phone)

## 2018-06-10 NOTE — Evaluation (Signed)
Physical Therapy Evaluation Patient Details Name: [name] MRN: [id number] DOB: [birth year] Today's Date: 06/10/2018   History of Present Illness  Pt is a 67 y/o male admitted after falling from ladder status post a type III a open ankle fracture dislocation status post external fixation and ORIF of medial malleolus. Plan for return to the operating room for repeat I&D and likely ORIF of fibula and revision fixation of his medial malleolus.  PMH significant for but not limited to: DM2, CKD, CIDP.     Clinical Impression  Pt presented sitting OOB in recliner chair, awake and willing to participate in therapy session. Prior to admission, pt reported that he was independent with all functional mobility and ADLs. Pt currently able to perform transfers with min guard and ambulate a short distance within his room with RW and min guard for safety. Plan for stair training at next session as pt has two steps (no railing) to enter his home. Pt would continue to benefit from skilled physical therapy services at this time while admitted and after d/c to address the below listed limitations in order to improve overall safety and independence with functional mobility.     Follow Up Recommendations Home health PT;Supervision/Assistance - 24 hour    Equipment Recommendations  Rolling walker with 5" wheels    Recommendations for Other Services       Precautions / Restrictions Precautions Precautions: Fall Required Braces or Orthoses: Other Brace/Splint Other Brace/Splint: external fixator to R ankle Restrictions Weight Bearing Restrictions: Yes RLE Weight Bearing: Non weight bearing      Mobility  Bed Mobility Overal bed mobility: Needs Assistance Bed Mobility: Supine to Sit     Supine to sit: Supervision     General bed mobility comments: pt OOB in recliner chair upon arrival  Transfers Overall transfer level: Needs assistance Equipment used: Rolling walker (2 wheeled) Transfers: Sit  to/from Stand Sit to Stand: Min guard         General transfer comment: min guard for safety, pt demonstrated good technique while maintaining NWB R LE  Ambulation/Gait Ambulation/Gait assistance: Min guard Gait Distance (Feet): 40 Feet Assistive device: Rolling walker (2 wheeled) Gait Pattern/deviations: (hop-to on L LE) Gait velocity: decreased Gait velocity interpretation: <1.31 ft/sec, indicative of household ambulator General Gait Details: pt steady with RW, slow and cautious; able to maintain NWB R LE independently  Stairs            Wheelchair Mobility    Modified Rankin (Stroke Patients Only)       Balance Overall balance assessment: Needs assistance Sitting-balance support: Feet unsupported;No upper extremity supported Sitting balance-Leahy Scale: Good     Standing balance support: Bilateral upper extremity supported;During functional activity Standing balance-Leahy Scale: Poor Standing balance comment: reliant on bilateral UEs on RW                             Pertinent Vitals/Pain Pain Assessment: Faces Faces Pain Scale: Hurts a little bit Pain Location: R ankle Pain Descriptors / Indicators: Constant;Operative site guarding Pain Intervention(s): Monitored during session;Repositioned    Home Living Family/patient expects to be discharged to:: Private residence Living Arrangements: Other relatives(friend, son) Available Help at Discharge: Family;Friend(s);Available 24 hours/day Type of Home: House Home Access: Stairs to enter Entrance Stairs-Rails: None Entrance Stairs-Number of Steps: 2-3 Home Layout: One level Home Equipment: Crutches;Tub bench      Prior Function Level of Independence: Independent  Hand Dominance        Extremity/Trunk Assessment   Upper Extremity Assessment Upper Extremity Assessment: Overall WFL for tasks assessed;Defer to OT evaluation    Lower Extremity Assessment Lower  Extremity Assessment: Overall WFL for tasks assessed;RLE deficits/detail RLE Deficits / Details: pt able to maintain NWB throughout independently; ex fix in place RLE: Unable to fully assess due to immobilization    Cervical / Trunk Assessment Cervical / Trunk Assessment: Normal  Communication   Communication: No difficulties  Cognition Arousal/Alertness: Awake/alert Behavior During Therapy: WFL for tasks assessed/performed Overall Cognitive Status: Within Functional Limits for tasks assessed                                        General Comments      Exercises     Assessment/Plan    PT Assessment Patient needs continued PT services  PT Problem List Decreased range of motion;Decreased activity tolerance;Decreased balance;Decreased mobility;Decreased coordination;Decreased knowledge of use of DME;Decreased safety awareness;Decreased knowledge of precautions;Pain       PT Treatment Interventions DME instruction;Gait training;Stair training;Functional mobility training;Therapeutic activities;Balance training;Therapeutic exercise;Neuromuscular re-education;Patient/family education    PT Goals (Current goals can be found in the Care Plan section)  Acute Rehab PT Goals Patient Stated Goal: return home PT Goal Formulation: With patient Time For Goal Achievement: 06/24/18 Potential to Achieve Goals: Good    Frequency Min 3X/week   Barriers to discharge        Co-evaluation               AM-PAC PT "6 Clicks" Daily Activity  Outcome Measure Difficulty turning over in bed (including adjusting bedclothes, sheets and blankets)?: A Little Difficulty moving from lying on back to sitting on the side of the bed? : A Little Difficulty sitting down on and standing up from a chair with arms (e.g., wheelchair, bedside commode, etc,.)?: Unable Help needed moving to and from a bed to chair (including a wheelchair)?: A Little Help needed walking in hospital room?: A  Little Help needed climbing 3-5 steps with a railing? : A Little 6 Click Score: 16    End of Session Equipment Utilized During Treatment: Gait belt Activity Tolerance: Patient tolerated treatment well Patient left: in chair;with call bell/phone within reach Nurse Communication: Mobility status PT Visit Diagnosis: Other abnormalities of gait and mobility (R26.89);Pain Pain - Right/Left: Right Pain - part of body: Ankle and joints of foot    Time: 1188-6773 PT Time Calculation (min) (ACUTE ONLY): 14 min   Charges:   PT Evaluation $PT Eval Moderate Complexity: 1 Mod     PT G Codes:        [place], PT, DPT [place] 06/10/2018, 3:40 PM

## 2018-06-10 NOTE — Progress Notes (Signed)
Occupational Therapy Treatment Patient Details Name: [name] MRN: [id number] DOB: 06/09/1951 Today's Date: 06/10/2018    History of present illness Pt is a 67 y/o male admitted after falling from ladder status post a type III a open ankle fracture dislocation status post external fixation and ORIF of medial malleolus.  Per Dr. Verlon Setting plans to return to the operating room for repeat I&D and likely ORIF of fibula and revision fixation of his medial malleolus.  PMH significant for but not limited to: DM2, CKD, CIDP.    OT comments  Patient progressing well.  Continues to require cueing for hand placement during transfers, pacing and safety with mobility using RW but able to maintain R LE NWB without cueing.  Patient demonstrates ability to complete toilet transfers to comfort height toilet using grabbars with min assist and toileting with min guard assist.  Patient would benefit from 3:1 commode at dc in order to maximize safety with toilet transfers.  Will continue to follow while admitted.    Follow Up Recommendations  No OT follow up;Supervision/Assistance - 24 hour    Equipment Recommendations  3 in 1 bedside commode    Recommendations for Other Services PT consult    Precautions / Restrictions Precautions Precautions: Fall Required Braces or Orthoses: Other Brace/Splint Other Brace/Splint: external fixator to R ankle Restrictions Weight Bearing Restrictions: Yes RLE Weight Bearing: Non weight bearing       Mobility Bed Mobility Overal bed mobility: Needs Assistance Bed Mobility: Supine to Sit     Supine to sit: Supervision     General bed mobility comments: HOB elevated and use of rails  Transfers Overall transfer level: Needs assistance Equipment used: 4-wheeled walker Transfers: Sit to/from Stand Sit to Stand: Min assist;From elevated surface         General transfer comment: verbal cueing for hand placement and pacing    Balance Overall balance assessment:  Needs assistance Sitting-balance support: Feet unsupported;No upper extremity supported(L LE only) Sitting balance-Leahy Scale: Fair     Standing balance support: Bilateral upper extremity supported;During functional activity Standing balance-Leahy Scale: Fair Standing balance comment: reliant on B UE support                           ADL either performed or assessed with clinical judgement   ADL Overall ADL's : Needs assistance/impaired                         Toilet Transfer: Minimal assistance;+2 for safety/equipment;BSC;RW Toilet Transfer Details (indicate cue type and reason): cueing for hand placement and safety, heavy use of grabbars for transitions  Toileting- Water quality scientist and Hygiene: Min guard;Sit to/from stand Toileting - Clothing Manipulation Details (indicate cue type and reason): clothing mgmt in standing with min guard for safety      Functional mobility during ADLs: Min guard;Cueing for safety;Rolling walker General ADL Comments: cueing for pacing and safety, good adherance to NWB R LE      Vision       Perception     Praxis      Cognition Arousal/Alertness: Awake/alert Behavior During Therapy: WFL for tasks assessed/performed Overall Cognitive Status: Within Functional Limits for tasks assessed                                          Exercises  Shoulder Instructions       General Comments      Pertinent Vitals/ Pain       Pain Assessment: Faces Faces Pain Scale: Hurts little more Pain Location: R ankle Pain Descriptors / Indicators: Constant;Operative site guarding Pain Intervention(s): Limited activity within patient's tolerance;Repositioned  Home Living                                          Prior Functioning/Environment              Frequency  Min 2X/week        Progress Toward Goals  OT Goals(current goals can now be found in the care plan section)   Progress towards OT goals: Progressing toward goals  Acute Rehab OT Goals Patient Stated Goal: to get home  OT Goal Formulation: With patient Time For Goal Achievement: 06/23/18 Potential to Achieve Goals: Good  Plan Discharge plan remains appropriate;Frequency remains appropriate    Co-evaluation                 AM-PAC PT "6 Clicks" Daily Activity     Outcome Measure   Help from another person eating meals?: None Help from another person taking care of personal grooming?: A Little Help from another person toileting, which includes using toliet, bedpan, or urinal?: A Little Help from another person bathing (including washing, rinsing, drying)?: A Little Help from another person to put on and taking off regular upper body clothing?: None Help from another person to put on and taking off regular lower body clothing?: A Little 6 Click Score: 20    End of Session Equipment Utilized During Treatment: Gait belt;Rolling walker  OT Visit Diagnosis: Other abnormalities of gait and mobility (R26.89);Pain Pain - Right/Left: Right Pain - part of body: Ankle and joints of foot   Activity Tolerance Patient tolerated treatment well   Patient Left in chair;with call bell/phone within reach;with chair alarm set;with nursing/sitter in room   Nurse Communication Mobility status;Other (comment)(verbal orders from MD for PT order)        Time: 1135-1148 OT Time Calculation (min): 13 min  Charges: OT Treatments $Self Care/Home Management : 8-22 mins  Delight Stare, OTR/L  Pager [place] 06/10/2018, 12:20 PM

## 2018-06-10 NOTE — Progress Notes (Signed)
Family Medicine Teaching Service Daily Progress Note Intern Pager: [phone]  Patient name: [name] Medical record number: [id number] Date of birth: [birth year] Age: 67 y.o. Gender: male  Primary Care Provider: Patient, No Pcp Per Consultants: Ortho, Ortho Trauma Code Status: Full Code  Pt Overview and Major Events to Date:  7/9 Admitted with open right ankle fracture 7/9 Fracture reduced under sedation in ED 7/9 Irrigation, debridement, ORIF of medical malleolus and external fixation   Assessment and Plan:  [name] is a 67 y.o. male presenting with Open right ankle fracture . PMH is significant for T2DM, CIPD (chronic inflammatroy demyelinating polyneuropathy), HTN, HLD, chronic acidosis, CKD (baseline creatinine ~2.5)  Open Right Ankle Fracture: Surgery today S/P Irrigation and debridement with internal fixation using cannulated screws and placement of spanning external fixator by Ortho.  Ortho trauma taking patient back to the OR today. -Surgery today -Holding Lovenox for surgery today -f/u PT/OT recommendations   AKI on chronic CKD w/ chronic acidosis: Improving on IV fluids.  Baseline creatinine 2.3.  Today creatinine 3.95. On bicarb 650 mg twice daily at home.  -Continue to hold metformin/lisinopril -Will continue 1.5 maintenance IV fluids at 150 cc/h post surgery -Repeat BMP this afternoon, follow-up BMP in morning -Consider nephrology consultation pending BMP this afternoon -Continue to avoid nephrotoxic agents -Continue home bicarb   Hyperkalemia, resolved:  This morning K 4.4 -Continue to monitor BMP daily -Continue IV fluids per AKI   T2DM: Chronic On metformin twice daily at home.  Glucose this morning 105 -New sSSI -Continue to hold metformin, will not continue as outpatient due to CKD  HLD: chronic On simvastatin 20mg  at home -Continue simvastatin  HTN: Blood pressure this morning 141/82 takes lisinopril 2.5mg  QD at home -Continue to hold  lisinopril in the setting of AKI -Restart as necessary and his creatinine allows  Hypocalcemia: Stable On vitamin D 1000, home dose -Continue Tums BID -Continue Vitamin D  History of Chronic inflammatory demyelinating polyneuropathy with chronic foot drop:  Stable History of multiple multiple fractures in right foot and chronic foot drop. -Follow-up PT/OT  Anemia: Acute on chronic Patient transfused 2 units of packed red blood cells on 7/11 when hemoglobin was 7.5.  Post transfusion hemoglobin was 7.9.  Expected rise would have been around 9.5. -Repeat CBC this afternoon  FEN/GI: carb modified diet  PPx: Lovenox per Ortho  Disposition: Home  Subjective:  **PATIENT IN SURGERY AND NOT EXAMINED, WILL SEE ON RETURN TO ROOM**  Objective: Temp:  [98.3 F (36.8 C)-98.6 F (37 C)] 98.3 F (36.8 C) (07/12 0408) Pulse Rate:  [71-75] 71 (07/12 0408) Resp:  [17-18] 17 (07/12 0408) BP: (141-155)/(82-88) 141/82 (07/12 0408) SpO2:  [98 %] 98 % (07/12 0408)  **PATIENT IN SURGERY AND NOT EXAMINED, WILL SEE ON RETURN TO ROOM**  Laboratory: Recent Labs  Lab 06/09/18 0334 06/10/18 0305 06/11/18 0439  WBC 8.7 7.0 5.9  HGB 8.9* 7.5* 7.9*  HCT 28.4* 24.1* 25.9*  PLT 197 174 198   Recent Labs  Lab 06/08/18 1432  06/10/18 0305 06/10/18 1120 06/10/18 1650 06/11/18 0439  NA 139   < > 139 138 139 138  K 4.7   < > 4.3  --  4.4 4.4  CL 110   < > 108  --  109 109  CO2 17*   < > 21*  --  22 20*  BUN 51*   < > 51*  --  48* 47*  CREATININE 4.44*   < > 4.26*  4.01* 4.01* 3.95*  CALCIUM 8.2*   < > 7.7*  --  8.1* 8.1*  PROT 6.0*  --   --   --   --   --   BILITOT 0.6  --   --   --   --   --   ALKPHOS 54  --   --   --   --   --   ALT 18  --   --   --   --   --   AST 21  --   --   --   --   --   GLUCOSE 111*   < > 114*  --  114* 105*   < > = values in this interval not displayed.     Imaging/Diagnostic Tests: No results found.  Rittberger, Bernita Raisin, DO 06/11/2018, 7:38 AM PGY-1,  [facility] Intern pager: [phone], text pages welcome

## 2018-06-10 NOTE — Plan of Care (Signed)
  Problem: Education: Goal: Knowledge of General Education information will improve Outcome: Progressing   Problem: Clinical Measurements: Goal: Ability to maintain clinical measurements within normal limits will improve Outcome: Progressing Goal: Will remain free from infection Outcome: Progressing   Problem: Activity: Goal: Risk for activity intolerance will decrease Outcome: Progressing   Problem: Coping: Goal: Level of anxiety will decrease Outcome: Progressing   Problem: Elimination: Goal: Will not experience complications related to bowel motility Outcome: Progressing Goal: Will not experience complications related to urinary retention Outcome: Progressing   Problem: Pain Managment: Goal: General experience of comfort will improve Outcome: Progressing   Problem: Safety: Goal: Ability to remain free from injury will improve Outcome: Progressing   Problem: Skin Integrity: Goal: Risk for impaired skin integrity will decrease Outcome: Progressing   Problem: Education: Goal: Knowledge of the prescribed therapeutic regimen will improve Outcome: Progressing   Problem: Activity: Goal: Ability to increase mobility will improve Outcome: Progressing   Problem: Physical Regulation: Goal: Postoperative complications will be avoided or minimized Outcome: Progressing   Problem: Pain Management: Goal: Pain level will decrease with appropriate interventions Outcome: Progressing   Problem: Skin Integrity: Goal: Signs of wound healing will improve Outcome: Progressing

## 2018-06-10 NOTE — Anesthesia Preprocedure Evaluation (Addendum)
Anesthesia Evaluation  Patient identified by MRN, date of birth, ID band Patient awake    Reviewed: Allergy & Precautions, NPO status , Patient's Chart, lab work & pertinent test results  History of Anesthesia Complications Negative for: history of anesthetic complications  Airway Mallampati: I  TM Distance: >3 FB Neck ROM: Full    Dental  (+) Dental Advisory Given, Implants, Caps   Pulmonary neg pulmonary ROS,    breath sounds clear to auscultation       Cardiovascular hypertension, Pt. on medications (-) angina Rhythm:Regular Rate:Normal     Neuro/Psych negative neurological ROS     GI/Hepatic negative GI ROS, Neg liver ROS,   Endo/Other  diabetes, Oral Hypoglycemic Agents  Renal/GU Renal InsufficiencyRenal disease (creat 4.01)     Musculoskeletal Fell from ladder: open ankle injury   Abdominal   Peds  Hematology Hb 7.5, plt 174k   Anesthesia Other Findings   Reproductive/Obstetrics                            Anesthesia Physical Anesthesia Plan  ASA: II  Anesthesia Plan: General   Post-op Pain Management:    Induction: Intravenous  PONV Risk Score and Plan: 3 and Ondansetron, Dexamethasone and Scopolamine patch - Pre-op  Airway Management Planned: Oral ETT  Additional Equipment:   Intra-op Plan:   Post-operative Plan: Extubation in OR  Informed Consent: I have reviewed the patients History and Physical, chart, labs and discussed the procedure including the risks, benefits and alternatives for the proposed anesthesia with the patient or authorized representative who has indicated his/her understanding and acceptance.   Dental advisory given  Plan Discussed with: CRNA and Surgeon  Anesthesia Plan Comments: (Plan routine monitors, GETA Dr Doreatha Martin prefers no block)        Anesthesia Quick Evaluation

## 2018-06-11 ENCOUNTER — Inpatient Hospital Stay (HOSPITAL_COMMUNITY): Payer: Medicare Other

## 2018-06-11 ENCOUNTER — Inpatient Hospital Stay (HOSPITAL_COMMUNITY): Payer: Medicare Other | Admitting: Anesthesiology

## 2018-06-11 ENCOUNTER — Encounter (HOSPITAL_COMMUNITY): Payer: Self-pay | Admitting: Anesthesiology

## 2018-06-11 ENCOUNTER — Encounter (HOSPITAL_COMMUNITY)
Admission: EM | Disposition: A | Discharge status: Home Health | Payer: Self-pay | Source: Home / Self Care | Attending: Family Medicine

## 2018-06-11 DIAGNOSIS — E119 Type 2 diabetes mellitus without complications: Secondary | ICD-10-CM

## 2018-06-11 DIAGNOSIS — S93431A Sprain of tibiofibular ligament of right ankle, initial encounter: Secondary | ICD-10-CM

## 2018-06-11 DIAGNOSIS — G629 Polyneuropathy, unspecified: Secondary | ICD-10-CM

## 2018-06-11 HISTORY — PX: I & D EXTREMITY: SHX5045

## 2018-06-11 HISTORY — PX: ORIF ANKLE FRACTURE: SHX5408

## 2018-06-11 HISTORY — PX: EXTERNAL FIXATION REMOVAL: SHX5040

## 2018-06-11 LAB — CBC
HCT: 25.9 % — ABNORMAL LOW (ref 39.0–52.0)
HCT: 26.7 % — ABNORMAL LOW (ref 39.0–52.0)
Hemoglobin: 7.9 g/dL — ABNORMAL LOW (ref 13.0–17.0)
Hemoglobin: 8.5 g/dL — ABNORMAL LOW (ref 13.0–17.0)
MCH: 30.7 pg (ref 26.0–34.0)
MCH: 31.4 pg (ref 26.0–34.0)
MCHC: 30.5 g/dL (ref 30.0–36.0)
MCHC: 31.8 g/dL (ref 30.0–36.0)
MCV: 100.8 fL — ABNORMAL HIGH (ref 78.0–100.0)
MCV: 98.5 fL (ref 78.0–100.0)
Platelets: 198 10*3/uL (ref 150–400)
Platelets: 196 10*3/uL (ref 150–400)
RBC: 2.57 MIL/uL — ABNORMAL LOW (ref 4.22–5.81)
RBC: 2.71 MIL/uL — ABNORMAL LOW (ref 4.22–5.81)
RDW: 13.2 % (ref 11.5–15.5)
RDW: 12.7 % (ref 11.5–15.5)
WBC: 5.9 10*3/uL (ref 4.0–10.5)
WBC: 6.6 10*3/uL (ref 4.0–10.5)

## 2018-06-11 LAB — BASIC METABOLIC PANEL
Anion gap: 9 (ref 5–15)
Anion gap: 14 (ref 5–15)
BUN: 47 mg/dL — ABNORMAL HIGH (ref 8–23)
BUN: 50 mg/dL — ABNORMAL HIGH (ref 8–23)
CO2: 20 mmol/L — ABNORMAL LOW (ref 22–32)
CO2: 20 mmol/L — ABNORMAL LOW (ref 22–32)
Calcium: 8.1 mg/dL — ABNORMAL LOW (ref 8.9–10.3)
Calcium: 8.5 mg/dL — ABNORMAL LOW (ref 8.9–10.3)
Chloride: 109 mmol/L (ref 98–111)
Chloride: 104 mmol/L (ref 98–111)
Creatinine, Ser: 3.95 mg/dL — ABNORMAL HIGH (ref 0.61–1.24)
Creatinine, Ser: 4.21 mg/dL — ABNORMAL HIGH (ref 0.61–1.24)
GFR calc Af Amer: 17 mL/min — ABNORMAL LOW (ref >60)
GFR calc Af Amer: 15 mL/min — ABNORMAL LOW (ref >60)
GFR calc non Af Amer: 14 mL/min — ABNORMAL LOW (ref >60)
GFR calc non Af Amer: 13 mL/min — ABNORMAL LOW (ref >60)
Glucose, Bld: 105 mg/dL — ABNORMAL HIGH (ref 70–99)
Glucose, Bld: 210 mg/dL — ABNORMAL HIGH (ref 70–99)
Potassium: 4.4 mmol/L (ref 3.5–5.1)
Potassium: 4.9 mmol/L (ref 3.5–5.1)
Sodium: 138 mmol/L (ref 135–145)
Sodium: 138 mmol/L (ref 135–145)

## 2018-06-11 LAB — GLUCOSE, CAPILLARY
Glucose-Capillary: 94 mg/dL (ref 70–99)
Glucose-Capillary: 120 mg/dL — ABNORMAL HIGH (ref 70–99)
Glucose-Capillary: 150 mg/dL — ABNORMAL HIGH (ref 70–99)
Glucose-Capillary: 190 mg/dL — ABNORMAL HIGH (ref 70–99)
Glucose-Capillary: 158 mg/dL — ABNORMAL HIGH (ref 70–99)

## 2018-06-11 LAB — HEMOGLOBIN AND HEMATOCRIT, BLOOD
HCT: 27.2 % — ABNORMAL LOW (ref 39.0–52.0)
Hemoglobin: 8.5 g/dL — ABNORMAL LOW (ref 13.0–17.0)

## 2018-06-11 SURGERY — OPEN REDUCTION INTERNAL FIXATION (ORIF) ANKLE FRACTURE
Anesthesia: General | Site: Ankle | Laterality: Right

## 2018-06-11 MED ORDER — ARTIFICIAL TEARS OPHTHALMIC OINT: TOPICAL_OINTMENT | OPHTHALMIC | Status: DC | PRN

## 2018-06-11 MED ORDER — CEFAZOLIN SODIUM-DEXTROSE 2-4 GM/100ML-% IV SOLN
2.0000 g | Freq: Three times a day (TID) | INTRAVENOUS | Status: AC
  Administered 2018-06-11 – 2018-06-12 (×2): 2 g via INTRAVENOUS
  Filled 2018-06-11 (×2): qty 100

## 2018-06-11 MED ORDER — LIDOCAINE HCL (CARDIAC) PF 100 MG/5ML IV SOSY
PREFILLED_SYRINGE | INTRAVENOUS | Status: DC | PRN
  Administered 2018-06-11: 20 mg via INTRAVENOUS
  Administered 2018-06-11: 40 mg via INTRAVENOUS

## 2018-06-11 MED ORDER — EPHEDRINE SULFATE 50 MG/ML IJ SOLN
INTRAMUSCULAR | Status: DC | PRN
  Administered 2018-06-11: 10 mg via INTRAVENOUS

## 2018-06-11 MED ORDER — PROPOFOL 10 MG/ML IV BOLUS
INTRAVENOUS | Status: AC
  Filled 2018-06-11: qty 40

## 2018-06-11 MED ORDER — MEPERIDINE HCL 50 MG/ML IJ SOLN: 6.2500 mg | INTRAMUSCULAR | Status: DC | PRN

## 2018-06-11 MED ORDER — FENTANYL CITRATE (PF) 250 MCG/5ML IJ SOLN
INTRAMUSCULAR | Status: AC
  Filled 2018-06-11: qty 5

## 2018-06-11 MED ORDER — PROPOFOL 10 MG/ML IV BOLUS
INTRAVENOUS | Status: DC | PRN
  Administered 2018-06-11: 150 mg via INTRAVENOUS

## 2018-06-11 MED ORDER — EPHEDRINE SULFATE 50 MG/ML IJ SOLN
INTRAMUSCULAR | Status: AC
  Filled 2018-06-11: qty 1

## 2018-06-11 MED ORDER — ROCURONIUM BROMIDE 10 MG/ML (PF) SYRINGE
PREFILLED_SYRINGE | INTRAVENOUS | Status: AC
  Filled 2018-06-11: qty 10

## 2018-06-11 MED ORDER — 0.9 % SODIUM CHLORIDE (POUR BTL) OPTIME
TOPICAL | Status: DC | PRN
  Administered 2018-06-11: 1000 mL

## 2018-06-11 MED ORDER — MIDAZOLAM HCL 2 MG/2ML IJ SOLN: 0.5000 mg | Freq: Once | INTRAMUSCULAR | Status: DC | PRN

## 2018-06-11 MED ORDER — PHENYLEPHRINE 40 MCG/ML (10ML) SYRINGE FOR IV PUSH (FOR BLOOD PRESSURE SUPPORT)
PREFILLED_SYRINGE | INTRAVENOUS | Status: AC
  Filled 2018-06-11: qty 10

## 2018-06-11 MED ORDER — GLYCOPYRROLATE 0.2 MG/ML IJ SOLN
INTRAMUSCULAR | Status: DC | PRN
  Administered 2018-06-11: 0.4 mg via INTRAVENOUS

## 2018-06-11 MED ORDER — SODIUM CHLORIDE 0.9 % IR SOLN
Status: DC | PRN
  Administered 2018-06-11: 6000 mL
  Administered 2018-06-11: 1000 mL

## 2018-06-11 MED ORDER — VANCOMYCIN HCL 1000 MG IV SOLR
INTRAVENOUS | Status: DC | PRN
  Administered 2018-06-11: 1000 mg via TOPICAL

## 2018-06-11 MED ORDER — VANCOMYCIN HCL 1000 MG IV SOLR
INTRAVENOUS | Status: AC
  Filled 2018-06-11: qty 1000

## 2018-06-11 MED ORDER — CEFAZOLIN SODIUM 1 G IJ SOLR
INTRAMUSCULAR | Status: AC
  Filled 2018-06-11: qty 20

## 2018-06-11 MED ORDER — MIDAZOLAM HCL 2 MG/2ML IJ SOLN
INTRAMUSCULAR | Status: AC
  Filled 2018-06-11: qty 2

## 2018-06-11 MED ORDER — BACITRACIN 500 UNIT/GM EX OINT
TOPICAL_OINTMENT | CUTANEOUS | Status: DC | PRN
  Administered 2018-06-11: 1 via TOPICAL

## 2018-06-11 MED ORDER — ENOXAPARIN SODIUM 30 MG/0.3ML ~~LOC~~ SOLN
30.0000 mg | SUBCUTANEOUS | Status: DC
  Administered 2018-06-12 – 2018-06-15 (×4): 30 mg via SUBCUTANEOUS
  Filled 2018-06-11 (×4): qty 0.3

## 2018-06-11 MED ORDER — LACTATED RINGERS IV SOLN
INTRAVENOUS | Status: DC | PRN
  Administered 2018-06-11 (×2): via INTRAVENOUS

## 2018-06-11 MED ORDER — ROCURONIUM BROMIDE 100 MG/10ML IV SOLN
INTRAVENOUS | Status: DC | PRN
  Administered 2018-06-11: 20 mg via INTRAVENOUS
  Administered 2018-06-11: 10 mg via INTRAVENOUS
  Administered 2018-06-11: 50 mg via INTRAVENOUS

## 2018-06-11 MED ORDER — ESMOLOL HCL 100 MG/10ML IV SOLN
INTRAVENOUS | Status: DC | PRN
  Administered 2018-06-11: 30 mg via INTRAVENOUS

## 2018-06-11 MED ORDER — SCOPOLAMINE 1 MG/3DAYS TD PT72
MEDICATED_PATCH | TRANSDERMAL | Status: AC
  Filled 2018-06-11: qty 1

## 2018-06-11 MED ORDER — NEOSTIGMINE METHYLSULFATE 10 MG/10ML IV SOLN
INTRAVENOUS | Status: DC | PRN
  Administered 2018-06-11: 3 mg via INTRAVENOUS

## 2018-06-11 MED ORDER — BACITRACIN ZINC 500 UNIT/GM EX OINT
TOPICAL_OINTMENT | CUTANEOUS | Status: AC
  Filled 2018-06-11: qty 28.35

## 2018-06-11 MED ORDER — ONDANSETRON HCL 4 MG/2ML IJ SOLN
INTRAMUSCULAR | Status: AC
  Filled 2018-06-11: qty 2

## 2018-06-11 MED ORDER — HYDROMORPHONE HCL 1 MG/ML IJ SOLN: 0.2500 mg | INTRAMUSCULAR | Status: DC | PRN

## 2018-06-11 MED ORDER — LIDOCAINE 2% (20 MG/ML) 5 ML SYRINGE
INTRAMUSCULAR | Status: AC
  Filled 2018-06-11: qty 5

## 2018-06-11 MED ORDER — GLYCOPYRROLATE PF 0.2 MG/ML IJ SOSY
PREFILLED_SYRINGE | INTRAMUSCULAR | Status: AC
  Filled 2018-06-11: qty 2

## 2018-06-11 MED ORDER — TOBRAMYCIN SULFATE 1.2 G IJ SOLR
INTRAMUSCULAR | Status: DC | PRN
  Administered 2018-06-11: 1.2 g via TOPICAL

## 2018-06-11 MED ORDER — DEXAMETHASONE SODIUM PHOSPHATE 10 MG/ML IJ SOLN
INTRAMUSCULAR | Status: AC
  Filled 2018-06-11: qty 1

## 2018-06-11 MED ORDER — PROMETHAZINE HCL 25 MG/ML IJ SOLN: 6.2500 mg | INTRAMUSCULAR | Status: DC | PRN

## 2018-06-11 MED ORDER — FENTANYL CITRATE (PF) 100 MCG/2ML IJ SOLN
INTRAMUSCULAR | Status: DC | PRN
  Administered 2018-06-11 (×5): 50 ug via INTRAVENOUS

## 2018-06-11 MED ORDER — CEFAZOLIN SODIUM-DEXTROSE 2-4 GM/100ML-% IV SOLN: 2.0000 g | Freq: Three times a day (TID) | INTRAVENOUS | Status: DC

## 2018-06-11 MED ORDER — CEFAZOLIN SODIUM-DEXTROSE 2-3 GM-%(50ML) IV SOLR
INTRAVENOUS | Status: DC | PRN
  Administered 2018-06-11: 2 g via INTRAVENOUS

## 2018-06-11 MED ORDER — SODIUM CHLORIDE 0.9 % IV SOLN
INTRAVENOUS | Status: DC
  Administered 2018-06-11 – 2018-06-12 (×3): via INTRAVENOUS

## 2018-06-11 MED ORDER — NEOSTIGMINE METHYLSULFATE 5 MG/5ML IV SOSY
PREFILLED_SYRINGE | INTRAVENOUS | Status: AC
  Filled 2018-06-11: qty 5

## 2018-06-11 MED ORDER — TOBRAMYCIN SULFATE 1.2 G IJ SOLR
INTRAMUSCULAR | Status: AC
  Filled 2018-06-11: qty 1.2

## 2018-06-11 MED ORDER — MIDAZOLAM HCL 5 MG/5ML IJ SOLN
INTRAMUSCULAR | Status: DC | PRN
  Administered 2018-06-11: 2 mg via INTRAVENOUS

## 2018-06-11 MED ORDER — DEXAMETHASONE SODIUM PHOSPHATE 10 MG/ML IJ SOLN
INTRAMUSCULAR | Status: DC | PRN
  Administered 2018-06-11: 10 mg via INTRAVENOUS

## 2018-06-11 MED ORDER — ONDANSETRON HCL 4 MG/2ML IJ SOLN
INTRAMUSCULAR | Status: DC | PRN
  Administered 2018-06-11: 4 mg via INTRAVENOUS

## 2018-06-11 SURGICAL SUPPLY — 87 items
BANDAGE ACE 4X5 VEL STRL LF (GAUZE/BANDAGES/DRESSINGS) ×1 IMPLANT
BANDAGE ACE 6X5 VEL STRL LF (GAUZE/BANDAGES/DRESSINGS) ×1 IMPLANT
BANDAGE ESMARK 6X9 LF (GAUZE/BANDAGES/DRESSINGS) ×1 IMPLANT
BIT DRILL 2.5X2.75 QC CALB (BIT) ×1 IMPLANT
BIT DRILL CALIBRATED 2.7 (BIT) ×1 IMPLANT
BNDG COHESIVE 4X5 TAN STRL (GAUZE/BANDAGES/DRESSINGS) ×2 IMPLANT
BNDG ESMARK 6X9 LF (GAUZE/BANDAGES/DRESSINGS)
BNDG GAUZE ELAST 4 BULKY (GAUZE/BANDAGES/DRESSINGS) ×2 IMPLANT
BRUSH SCRUB SURG 4.25 DISP (MISCELLANEOUS) ×4 IMPLANT
CHLORAPREP W/TINT 26ML (MISCELLANEOUS) ×2 IMPLANT
COVER MAYO STAND STRL (DRAPES) ×1 IMPLANT
COVER SURGICAL LIGHT HANDLE (MISCELLANEOUS) ×3 IMPLANT
DRAPE C-ARM 42X72 X-RAY (DRAPES) ×2 IMPLANT
DRAPE C-ARMOR (DRAPES) ×2 IMPLANT
DRAPE HALF SHEET 40X57 (DRAPES) ×4 IMPLANT
DRAPE ORTHO SPLIT 77X108 STRL (DRAPES)
DRAPE SURG ORHT 6 SPLT 77X108 (DRAPES) ×2 IMPLANT
DRAPE U-SHAPE 47X51 STRL (DRAPES) ×2 IMPLANT
DRILL BIT 2.5X100 214235007 (MISCELLANEOUS) ×1 IMPLANT
DRSG ADAPTIC 3X8 NADH LF (GAUZE/BANDAGES/DRESSINGS) ×3 IMPLANT
ELECT REM PT RETURN 9FT ADLT (ELECTROSURGICAL) ×2
ELECTRODE REM PT RTRN 9FT ADLT (ELECTROSURGICAL) ×1 IMPLANT
EVACUATOR 1/8 PVC DRAIN (DRAIN) IMPLANT
FLUID NSS /IRRIG 3000 ML XXX (IV SOLUTION) ×2 IMPLANT
GAUZE SPONGE 4X4 12PLY STRL (GAUZE/BANDAGES/DRESSINGS) ×3 IMPLANT
GLOVE BIO SURGEON STRL SZ 6.5 (GLOVE) ×1 IMPLANT
GLOVE BIO SURGEON STRL SZ7.5 (GLOVE) ×7 IMPLANT
GLOVE BIOGEL PI IND STRL 6.5 (GLOVE) IMPLANT
GLOVE BIOGEL PI IND STRL 7.0 (GLOVE) IMPLANT
GLOVE BIOGEL PI IND STRL 7.5 (GLOVE) ×1 IMPLANT
GLOVE BIOGEL PI INDICATOR 6.5 (GLOVE) ×1
GLOVE BIOGEL PI INDICATOR 7.0 (GLOVE) ×1
GLOVE BIOGEL PI INDICATOR 7.5 (GLOVE) ×1
GOWN STRL REUS W/ TWL LRG LVL3 (GOWN DISPOSABLE) ×2 IMPLANT
GOWN STRL REUS W/TWL LRG LVL3 (GOWN DISPOSABLE) ×2
HANDPIECE INTERPULSE COAX TIP (DISPOSABLE) ×1
IV NS 1000ML (IV SOLUTION) ×1
IV NS 1000ML BAXH (IV SOLUTION) IMPLANT
K-WIRE ACE 1.6X6 (WIRE) ×4
KIT BASIN OR (CUSTOM PROCEDURE TRAY) ×2 IMPLANT
KIT TURNOVER KIT B (KITS) ×2 IMPLANT
KWIRE ACE 1.6X6 (WIRE) IMPLANT
MANIFOLD NEPTUNE II (INSTRUMENTS) ×2 IMPLANT
NDL HYPO 21X1.5 SAFETY (NEEDLE) IMPLANT
NEEDLE 25GAX1.5 (MISCELLANEOUS) ×1 IMPLANT
NEEDLE HYPO 21X1.5 SAFETY (NEEDLE) IMPLANT
NS IRRIG 1000ML POUR BTL (IV SOLUTION) ×2 IMPLANT
PACK ORTHO EXTREMITY (CUSTOM PROCEDURE TRAY) ×2 IMPLANT
PACK TOTAL JOINT (CUSTOM PROCEDURE TRAY) ×2 IMPLANT
PAD ARMBOARD 7.5X6 YLW CONV (MISCELLANEOUS) ×3 IMPLANT
PAD CAST 4YDX4 CTTN HI CHSV (CAST SUPPLIES) IMPLANT
PADDING CAST COTTON 4X4 STRL (CAST SUPPLIES) ×1
PADDING CAST COTTON 6X4 STRL (CAST SUPPLIES) ×2 IMPLANT
PLATE LOCK 6H 139 RT DIST FIB (Plate) ×1 IMPLANT
SCREW CORT T15 24X3.5XST LCK (Screw) IMPLANT
SCREW CORT T15 TPR 55X3.5XST (Screw) IMPLANT
SCREW CORTICAL 3.5X24MM (Screw) ×1 IMPLANT
SCREW CORTICAL 3.5X55MM (Screw) ×1 IMPLANT
SCREW LOCK CORT STAR 3.5X12 (Screw) ×1 IMPLANT
SCREW LOCK CORT STAR 3.5X16 (Screw) ×3 IMPLANT
SCREW LOCK CORT STAR 3.5X18 (Screw) ×2 IMPLANT
SCREW LOW PROFILE 18MMX3.5MM (Screw) ×2 IMPLANT
SCREW LP 3.5 (Screw) ×1 IMPLANT
SCREW LP 3.5X65MM (Screw) ×1 IMPLANT
SCREW LP 3.5X90MM (Screw) ×1 IMPLANT
SCREW LP 3.5X95MM (Screw) ×1 IMPLANT
SCREW NON LOCKING LP 3.5 16MM (Screw) ×1 IMPLANT
SET HNDPC FAN SPRY TIP SCT (DISPOSABLE) IMPLANT
SPLINT PLASTER CAST XFAST 5X30 (CAST SUPPLIES) IMPLANT
SPLINT PLASTER XFAST SET 5X30 (CAST SUPPLIES) ×1
SPONGE LAP 18X18 X RAY DECT (DISPOSABLE) ×2 IMPLANT
STAPLER VISISTAT 35W (STAPLE) ×2 IMPLANT
SUCTION FRAZIER HANDLE 10FR (MISCELLANEOUS) ×1
SUCTION TUBE FRAZIER 10FR DISP (MISCELLANEOUS) ×1 IMPLANT
SUT ETHILON 3 0 PS 1 (SUTURE) ×6 IMPLANT
SUT MON AB 2-0 CT1 36 (SUTURE) ×3 IMPLANT
SUT VIC AB 0 CT1 27 (SUTURE)
SUT VIC AB 0 CT1 27XBRD ANBCTR (SUTURE) ×1 IMPLANT
SUT VIC AB 2-0 CT1 27 (SUTURE) ×3
SUT VIC AB 2-0 CT1 TAPERPNT 27 (SUTURE) ×2 IMPLANT
SYR CONTROL 10ML LL (SYRINGE) ×1 IMPLANT
TOWEL OR 17X24 6PK STRL BLUE (TOWEL DISPOSABLE) ×2 IMPLANT
TOWEL OR 17X26 10 PK STRL BLUE (TOWEL DISPOSABLE) ×3 IMPLANT
TUBE CONNECTING 12X1/4 (SUCTIONS) ×2 IMPLANT
UNDERPAD 30X30 (UNDERPADS AND DIAPERS) ×2 IMPLANT
WATER STERILE IRR 1000ML POUR (IV SOLUTION) ×1 IMPLANT
YANKAUER SUCT BULB TIP NO VENT (SUCTIONS) ×2 IMPLANT

## 2018-06-11 NOTE — Op Note (Signed)
OrthopaedicSurgeryOperativeNote (ZOX:[id number]) Date of Surgery: 06/11/2018  Admit Date: 06/08/2018   Diagnoses: Pre-Op Diagnoses: Right type 3A open ankle fracture dislocation Right syndesmotic disruption S/p external fixation  Post-Op Diagnosis: Same  Procedures: 1. CPT 11012-Irrigation and debridement of right open ankle fracture 2. CPT 27814-Open reduction internal fixation of right ankle fracture 3. CPT 27829-Open reduction internal fixation of right syndesmosis 4. CPT 20694-Removal of external fixator 5. CPT 20680-Removal of hardware   Surgeons: Primary: Shona Needles, MD   Location:[facility] OR ROOM 03   AnesthesiaGeneral   Antibiotics:Ancef 2g preop   Tourniquettime: Total Tourniquet Time Documented: Thigh (Right) - 49 minutes Total: Thigh (Right) - 49 minutes  [id number]:57 mL   Complications:None  Specimens:None  Implants: Implant Name Type Inv. Item Serial No. Manufacturer Lot No. LRB No. Used Action  PLATE ANATOMIC FIBULA RT 6H - [id number] Plate PLATE ANATOMIC FIBULA RT 6H  [employer](ORTH,TRAU,BIO,SG)  Right 1 Implanted  SCREW LP 3.5X95MM - [id number] Screw SCREW LP 3.5X95MM  [employer](ORTH,TRAU,BIO,SG)  Right 1 Implanted  SCREW LP 3.5X90MM - [id number] Screw SCREW LP 3.5X90MM  [employer](ORTH,TRAU,BIO,SG)  Right 1 Implanted  SCREW NON LOCKING LP 3.5 16MM - [id number] Screw SCREW NON LOCKING LP 3.5 16MM  [employer](ORTH,TRAU,BIO,SG)  Right 1 Implanted  SCREW LOW PROFILE 18MMX3.5MM - [id number] Screw SCREW LOW PROFILE 18MMX3.5MM  [employer](ORTH,TRAU,BIO,SG)  Right 2 Implanted  SCREW LP 3.5X65MM - [id number] Screw SCREW LP 3.5X65MM  [employer](ORTH,TRAU,BIO,SG)  Right 1 Implanted  SCREW LP 3.5 - [id number] Screw SCREW LP 3.5  [employer](ORTH,TRAU,BIO,SG)  Right 1 Implanted  SCREW CORTICAL 3.5X55MM - [id number] Screw SCREW CORTICAL 3.5X55MM  [employer](ORTH,TRAU,BIO,SG)  Right 1 Implanted  SCREW CORT LOCK 3.5MM 12MM - [id number] Screw  SCREW CORT LOCK 3.5MM 12MM  [employer](ORTH,TRAU,BIO,SG)  Right 1 Implanted  SCREW CORT LOCK 3.5MM 16MM - [id number] Screw SCREW CORT LOCK 3.5MM 16MM  [employer](ORTH,TRAU,BIO,SG)  Right 3 Implanted  SCREW CORT LOCK 3.5MM 18MM - [id number] Screw SCREW CORT LOCK 3.5MM 18MM  [employer](ORTH,TRAU,BIO,SG)  Right 2 Implanted    IndicationsforSurgery: 67 year old male with a history of type 2 diabetes and peripheral neuropathy who fell off a ladder and sustained a type III a open right ankle fracture dislocation.  He was taken initially for urgent irrigation debridement and external fixation with fixation of his medial malleolus.  Unfortunately due to his poor bone stock and difficulty of the fracture not great fixation was able to be obtained from his medial malleolus.  I obtained a x-ray and CT scan after I took over his care to evaluate his injury.  He had a cortical fragment that was in his tibiotalar joint along with some malrotation of his medial malleolus.  I felt that to provide him the most optimal outcome that thefragment would need to be removed as well as a repeat irrigation debridement performed of his tibiotalar joint and his open fracture.  I also felt that we would proceed with open reduction internal fixation of the lateral malleolus with syndesmotic fixation.  I discussed risks and benefits with the patient. Risks discussed included bleeding requiring blood transfusion, bleeding causing a hematoma, infection, malunion, nonunion, damage to surrounding nerves and blood vessels, pain, hardware prominence or irritation, hardware failure, stiffness, post-traumatic arthritis, DVT/PE, compartment syndrome, and even amputation.  The patient agreed to proceed with surgery and consent was obtained.   Operative Findings: 1.  Repeat irrigation debridement of her right ankle fracture.  This included a removal of the cortical fragment that was  in the tibiotalar joint. 2. Open reduction internal  fixation of medial malleolus using [employer] 3.5 mm nonlocking screws gaining bicortical purchase in the tibia 3.  Open reduction internal fixation of right lateral malleolus using [employer] ALPS anatomic distal fibular locking plate 4.  Syndesmotic fixation using three, 3.5 mm quadracortical screws across the fibula and tibia. 5.  Removal of external fixator.  Procedure: The patient was identified in the preoperative holding area. Consent was confirmed with the patient and their family and all questions were answered. The operative extremity was marked after confirmation with the patient and there were then brought back to the operating room by our anesthesia colleagues. They were placed under general anesthesia and carefully transferred over to a radiolucent flat top table. Here the operative extremity had a bump placed under the hip and the contralateral leg was secured down.  The patient had an approximately 10 cm in length laceration of medial side of the ankle.  The external fixator was prepped into the field.  The leg was then prepped and draped in usual sterile fashion. A timeout was performed to verify the patient, the procedure and the extremity. Preoperative antibiotics were dosed.  I started out by reopening the traumatic laceration and debriding the open wound.  4.0 mm cannulated screws that were in place.  These were removed without difficulty.  Starting with the skin, I excised traumatized skin edges with a scalpel. The subcutaneous fat and fascia was excised with a scissors. The bone was debrided with curette rongeur.  I dislocated the tibia once again to access the tibiotalar joint and retrieve the small cortical fragment that was in the tibiotalar joint.  Low pressure pulsatile lavage was used to irrigate the wound. A total of 6 L of normal saline was used to the irrigate the wound. The surgeon's and assistant's gloves were changed and the wound was explored. The posterior tibialis,  FDL and neurovascular bundle were all intact.   I then went back to the medial side to address the fracture. A 2.44mm drill hole was placed in the metaphyseal cortex and a pointed reduction tenaculum was used with this drill hole and the tip of the medial malleolus to reduce and compress the fracture.  I used the previous percutaneously made incisions. The bone was then drilled bicortically under fluoroscopy. The drill bit was removed, the depth was measured and a 3.13mm screw was placed gaining bicortical purchase. The process was repeated just posterior to the first screw.  At this point I moved to the lateral side of the ankle.  I marked out an incision using fluoroscopy.  I carried the incision down through skin and subcutaneous tissue after I inflated the tourniquet.  I attempted to keep the periosteum intact over the comminuted midshaft fibular fracture.  I exposed the proximal fibula to be able to place the proximal portion of the plate.  At this the distal aspect of the fibula was exposed as well to make sure that I was anatomic with the plate.  I chose an appropriate leg length plate as noted above.  I then pinned this in place both proximally and distally.  I obtained fluoroscopic images and confirmed adequate length.  I then proceeded to place a nonlocking screw in the distal segment to bring the plate flush to bone.  I then chose to place locking screws in the distal segment to provide fixation.  The nonlocking screw was replaced with a locking screw.  I then used a  threaded guide to pull distal traction to regain the length of the fibula.  The distal segment was then pinned into the talus to hold the length.  I then proceeded to place a 3.5 mm nonlocking screw in the proximal shaft segment.  2 more nonlocking screws were then placed.  At this point a external rotation stress view was performed which showed medial clear space widening consistent with syndesmotic disruption.  This combined with his  neuropathy I felt that multiple points of syndesmotic fixation were appropriate.  Using a 2.5 mm drill I proceeded to place a 3.5 mm nonlocking screws across the fibula and tibia gaining purchase it for cortices.  I placed a total of 3 screws and provided rigid fixation for the syndesmosis.    Final fluoroscopic images were obtained.  The wounds were then copiously irrigated.  The external fixator was removed in the ex-fix pin sites were debrided.  A gram of vancomycin powder 1.2 g of tobramycin powder were placed in the incisions.  A layer closure consisting of 2-0 Monocryl and 3-0 nylon was used.  I then placed a sterile dressing consisting of bacitracin ointment, Adaptic, 4 x 4's and sterile cast padding.  A well-padded short leg splint was in place.  He was awoken from anesthesia and taken to PACU in stable condition.  Post Op Plan/Instructions: Patient will be nonweightbearing to right lower extremity.  He will remain in the splint until he returns to see me in 1 to 2 weeks.  We will change his splint out to a cast or boot depending on the fixation and the appearance of his wounds.  He will receive postoperative Ancef.  He will receive Lovenox for DVT prophylaxis.  He will be monitored for a possible need for blood transfusion.  His creatinine will be continued to be monitored by the family medicine team.  I was present and performed the entire surgery.  Katha Hamming, MD [facility]

## 2018-06-11 NOTE — Anesthesia Procedure Notes (Signed)
Procedure Name: Intubation Date/Time: 06/11/2018 7:50 AM Performed by: Scheryl Darter, CRNA Pre-anesthesia Checklist: Patient identified, Emergency Drugs available, Suction available and Patient being monitored Patient Re-evaluated:Patient Re-evaluated prior to induction Oxygen Delivery Method: Circle System Utilized Preoxygenation: Pre-oxygenation with 100% oxygen Induction Type: IV induction Ventilation: Mask ventilation without difficulty Grade View: Grade II Tube type: Oral Tube size: 7.5 mm Number of attempts: 1 Airway Equipment and Method: Stylet and Oral airway Placement Confirmation: ETT inserted through vocal cords under direct vision,  positive ETCO2 and breath sounds checked- equal and bilateral Secured at: 23 cm Tube secured with: Tape Dental Injury: Teeth and Oropharynx as per pre-operative assessment  Difficulty Due To: Difficulty was anticipated, Difficult Airway- due to anterior larynx and Difficult Airway- due to dentition Comments: Intubated after DL x 1, crowns and other teeth intact/anterior

## 2018-06-11 NOTE — Plan of Care (Signed)

## 2018-06-11 NOTE — Progress Notes (Signed)
Patient seen and examined post surgery.  Patient states that he is not currently in any pain.  States that surgery went well.  Denies any new complaints.  Is already worked with physical therapy.  Physical exam: General: 67 year old male in no acute distress, lying in bed Cardio: Regular rate and rhythm with no murmurs rubs or gallops Lungs: Clear to auscultation bilaterally, no wheezes, crackles, rhonchi Abdomen: Soft, nontender, positive bowel sounds Extremities: Right lower extremity with Ace bandage from foot to knee, left lower extremity no edema Skin: Warm and dry  We will restart fluid resuscitation. We will restart patient's Lovenox as DVT prophylaxis. Continue to monitor for pain and constipation.  Bernita Raisin Rittberger, DO  4:48 PM

## 2018-06-11 NOTE — Transfer of Care (Signed)
Immediate Anesthesia Transfer of Care Note  Patient: [name]  Procedure(s) Performed: OPEN REDUCTION INTERNAL FIXATION (ORIF) ANKLE FRACTURE (Right Ankle) IRRIGATION AND DEBRIDEMENT EXTREMITY (Right Ankle) REMOVAL EXTERNAL FIXATION LEG (Right Ankle)  Patient Location: PACU  Anesthesia Type:General  Level of Consciousness: awake, alert , oriented and sedated  Airway & Oxygen Therapy: Patient Spontanous Breathing and Patient connected to nasal cannula oxygen  Post-op Assessment: Report given to RN, Post -op Vital signs reviewed and stable and Patient moving all extremities  Post vital signs: Reviewed and stable  Last Vitals:  Vitals Value Taken Time  BP 154/91 06/11/2018 10:35 AM  Temp    Pulse 93 06/11/2018 10:42 AM  Resp 16 06/11/2018 10:42 AM  SpO2 96 % 06/11/2018 10:42 AM  Vitals shown include unvalidated device data.  Last Pain:  Vitals:   06/11/18 0408  TempSrc: Oral  PainSc:       Patients Stated Pain Goal: 1 (97/47/18 5501)  Complications: No apparent anesthesia complications

## 2018-06-11 NOTE — Anesthesia Postprocedure Evaluation (Signed)
Anesthesia Post Note  Patient: [name]  Procedure(s) Performed: OPEN REDUCTION INTERNAL FIXATION (ORIF) ANKLE FRACTURE (Right Ankle) IRRIGATION AND DEBRIDEMENT EXTREMITY (Right Ankle) REMOVAL EXTERNAL FIXATION LEG (Right Ankle)     Patient location during evaluation: PACU Anesthesia Type: General Level of consciousness: awake and alert, oriented and patient cooperative Pain management: pain level controlled Vital Signs Assessment: post-procedure vital signs reviewed and stable Respiratory status: spontaneous breathing, nonlabored ventilation, respiratory function stable and patient connected to nasal cannula oxygen Cardiovascular status: blood pressure returned to baseline and stable Postop Assessment: no apparent nausea or vomiting Anesthetic complications: no    Last Vitals:  Vitals:   06/11/18 1050 06/11/18 1054  BP:  (!) 165/92  Pulse: 92 99  Resp: (!) 23 12  Temp:  (!) 36.2 C  SpO2: 95% 96%    Last Pain:  Vitals:   06/11/18 1054  TempSrc:   PainSc: 0-No pain                 Shammond Arave,E. Saloma Cadena

## 2018-06-11 NOTE — Interval H&P Note (Signed)
History and Physical Interval Note:  06/11/2018 6:55 AM  [name]  has presented today for surgery, with the diagnosis of Right open ankle fracture dislocation  The various methods of treatment have been discussed with the patient and family. After consideration of risks, benefits and other options for treatment, the patient has consented to  Procedure(s): OPEN REDUCTION INTERNAL FIXATION (ORIF) ANKLE FRACTURE (Right) IRRIGATION AND DEBRIDEMENT EXTREMITY (Right) as a surgical intervention .  The patient's history has been reviewed, patient examined, no change in status, stable for surgery.  I have reviewed the patient's chart and labs.  Questions were answered to the patient's satisfaction.     Lennette Bihari P Sandara Tyree

## 2018-06-11 NOTE — Progress Notes (Signed)
Physical Therapy Treatment Patient Details Name: [name] MRN: [id number] DOB: 1951-09-16 Today's Date: 06/11/2018    History of Present Illness Pt is a 67 y/o male admitted after falling from ladder status post a type III a open ankle fracture dislocation status post external fixation and ORIF of medial malleolus. Plan for return to the operating room for repeat I&D and likely ORIF of fibula and revision fixation of his medial malleolus.  PMH significant for but not limited to: DM2, CKD, CIDP.     PT Comments    Pt making steady progress with functional mobility and participated in stair training this session. Pt required cueing and instruction throughout with mod A for stability/support of walker. Plan for further stair training at next session prior to d/c home.   Pt would continue to benefit from skilled physical therapy services at this time while admitted and after d/c to address the below listed limitations in order to improve overall safety and independence with functional mobility.    Follow Up Recommendations  Home health PT;Supervision/Assistance - 24 hour     Equipment Recommendations  Rolling walker with 5" wheels    Recommendations for Other Services       Precautions / Restrictions Precautions Precautions: Fall Restrictions Weight Bearing Restrictions: Yes RLE Weight Bearing: Non weight bearing    Mobility  Bed Mobility Overal bed mobility: Needs Assistance Bed Mobility: Supine to Sit;Sit to Supine     Supine to sit: Supervision Sit to supine: Supervision   General bed mobility comments: HOB elevated, increased time and effort  Transfers Overall transfer level: Needs assistance Equipment used: Rolling walker (2 wheeled) Transfers: Sit to/from Stand Sit to Stand: Min guard;From elevated surface         General transfer comment: min guard for safety with bed in elevated position  Ambulation/Gait Ambulation/Gait assistance: Min guard Gait Distance  (Feet): 100 Feet Assistive device: Rolling walker (2 wheeled) Gait Pattern/deviations: (hop-to on L LE) Gait velocity: decreased Gait velocity interpretation: <1.31 ft/sec, indicative of household ambulator General Gait Details: pt steady with RW, slow and cautious; able to maintain NWB R LE independently   Stairs Stairs: Yes Stairs assistance: Mod assist Stair Management: No rails;Step to pattern;Backwards;With walker Number of Stairs: 2 General stair comments: pt's daughter present throughout. PT provided cueing and instruction throughout and gave pt handout. Pt required mod A for stabilization of RW   Wheelchair Mobility    Modified Rankin (Stroke Patients Only)       Balance Overall balance assessment: Needs assistance Sitting-balance support: Feet unsupported;No upper extremity supported Sitting balance-Leahy Scale: Good     Standing balance support: Bilateral upper extremity supported;During functional activity Standing balance-Leahy Scale: Poor Standing balance comment: reliant on bilateral UEs on RW                            Cognition Arousal/Alertness: Awake/alert Behavior During Therapy: WFL for tasks assessed/performed Overall Cognitive Status: Within Functional Limits for tasks assessed                                        Exercises      General Comments        Pertinent Vitals/Pain Pain Assessment: Faces Faces Pain Scale: Hurts a little bit Pain Location: R ankle Pain Descriptors / Indicators: Constant;Operative site guarding Pain Intervention(s): Monitored during session;Repositioned  Home Living                      Prior Function            PT Goals (current goals can now be found in the care plan section) Acute Rehab PT Goals PT Goal Formulation: With patient Time For Goal Achievement: 06/24/18 Potential to Achieve Goals: Good Progress towards PT goals: Progressing toward goals     Frequency    Min 3X/week      PT Plan Current plan remains appropriate    Co-evaluation              AM-PAC PT "6 Clicks" Daily Activity  Outcome Measure  Difficulty turning over in bed (including adjusting bedclothes, sheets and blankets)?: A Little Difficulty moving from lying on back to sitting on the side of the bed? : A Little Difficulty sitting down on and standing up from a chair with arms (e.g., wheelchair, bedside commode, etc,.)?: Unable Help needed moving to and from a bed to chair (including a wheelchair)?: A Little Help needed walking in hospital room?: A Little Help needed climbing 3-5 steps with a railing? : A Lot 6 Click Score: 15    End of Session Equipment Utilized During Treatment: Gait belt Activity Tolerance: Patient tolerated treatment well Patient left: in bed;with call bell/phone within reach;with family/visitor present Nurse Communication: Mobility status PT Visit Diagnosis: Other abnormalities of gait and mobility (R26.89);Pain Pain - Right/Left: Right Pain - part of body: Ankle and joints of foot     Time: 8250-5397 PT Time Calculation (min) (ACUTE ONLY): 25 min  Charges:  $Gait Training: 23-37 mins                    G Codes:       [place], [place], [place] 06/11/2018, 2:14 PM

## 2018-06-12 ENCOUNTER — Inpatient Hospital Stay (HOSPITAL_COMMUNITY): Payer: Medicare Other

## 2018-06-12 LAB — GLUCOSE, CAPILLARY
Glucose-Capillary: 128 mg/dL — ABNORMAL HIGH (ref 70–99)
Glucose-Capillary: 113 mg/dL — ABNORMAL HIGH (ref 70–99)
Glucose-Capillary: 112 mg/dL — ABNORMAL HIGH (ref 70–99)
Glucose-Capillary: 122 mg/dL — ABNORMAL HIGH (ref 70–99)

## 2018-06-12 LAB — BASIC METABOLIC PANEL
Anion gap: 11 (ref 5–15)
BUN: 48 mg/dL — ABNORMAL HIGH (ref 8–23)
CO2: 20 mmol/L — ABNORMAL LOW (ref 22–32)
Calcium: 8.1 mg/dL — ABNORMAL LOW (ref 8.9–10.3)
Chloride: 105 mmol/L (ref 98–111)
Creatinine, Ser: 3.81 mg/dL — ABNORMAL HIGH (ref 0.61–1.24)
GFR calc Af Amer: 17 mL/min — ABNORMAL LOW (ref >60)
GFR calc non Af Amer: 15 mL/min — ABNORMAL LOW (ref >60)
Glucose, Bld: 144 mg/dL — ABNORMAL HIGH (ref 70–99)
Potassium: 4.7 mmol/L (ref 3.5–5.1)
Sodium: 136 mmol/L (ref 135–145)

## 2018-06-12 LAB — PREPARE RBC (CROSSMATCH)

## 2018-06-12 LAB — CBC
HCT: 23.4 % — ABNORMAL LOW (ref 39.0–52.0)
Hemoglobin: 7.6 g/dL — ABNORMAL LOW (ref 13.0–17.0)
MCH: 31.4 pg (ref 26.0–34.0)
MCHC: 32.5 g/dL (ref 30.0–36.0)
MCV: 96.7 fL (ref 78.0–100.0)
Platelets: 187 10*3/uL (ref 150–400)
RBC: 2.42 MIL/uL — ABNORMAL LOW (ref 4.22–5.81)
RDW: 12.7 % (ref 11.5–15.5)
WBC: 8.9 10*3/uL (ref 4.0–10.5)

## 2018-06-12 LAB — HEMOGLOBIN AND HEMATOCRIT, BLOOD
HCT: 29.3 % — ABNORMAL LOW (ref 39.0–52.0)
Hemoglobin: 9.7 g/dL — ABNORMAL LOW (ref 13.0–17.0)

## 2018-06-12 MED ORDER — SODIUM CHLORIDE 0.9 % IV SOLN
INTRAVENOUS | Status: DC
  Administered 2018-06-12: 16:00:00 via INTRAVENOUS

## 2018-06-12 MED ORDER — FAMOTIDINE IN NACL 20-0.9 MG/50ML-% IV SOLN
20.0000 mg | INTRAVENOUS | Status: DC
  Administered 2018-06-12 – 2018-06-14 (×3): 20 mg via INTRAVENOUS
  Filled 2018-06-12 (×3): qty 50

## 2018-06-12 MED ORDER — SODIUM CHLORIDE 0.9% IV SOLUTION: Freq: Once | INTRAVENOUS | Status: DC

## 2018-06-12 MED ORDER — CALCIUM CARBONATE ANTACID 500 MG PO CHEW
2.0000 | CHEWABLE_TABLET | ORAL | Status: DC | PRN
  Administered 2018-06-12: 400 mg via ORAL
  Filled 2018-06-12: qty 2

## 2018-06-12 MED ORDER — POLYETHYLENE GLYCOL 3350 17 G PO PACK
17.0000 g | PACK | Freq: Every day | ORAL | Status: DC
  Administered 2018-06-14 – 2018-06-15 (×2): 17 g via ORAL
  Filled 2018-06-12 (×2): qty 1

## 2018-06-12 NOTE — Discharge Summary (Signed)
[facility] Discharge Summary  Patient name: [name] Medical record number: [id number] Date of birth: 1950/12/06 Age: 67 y.o. Gender: male Date of Admission: 06/08/2018  Date of Discharge: 06/15/2018 Admitting Physician: Netta Cedars, MD  Primary Care Provider: Patient, No Pcp Per Consultants: Orthopedics  Indication for Hospitalization: Open ankle fracture  Discharge Diagnoses/Problem List:  Patient Active Problem List   Diagnosis Date Noted  . Small bowel obstruction ([place])   . Type 2 diabetes mellitus ([place]) 06/11/2018  . Peripheral neuropathy 06/11/2018  . Ankle syndesmosis disruption, right, initial encounter 06/11/2018  . AKI (acute kidney injury) ([place])   . Dehydration   . Open right ankle fracture 06/08/2018  . Fracture, ankle 06/08/2018  . Open displaced fracture of medial malleolus of tibia, type III 06/08/2018     Disposition: DC home with home health  Discharge Condition: Stable  Discharge Exam:  General: Alert and cooperative and appears to be in no acute distress HEENT: Neck non-tender without lymphadenopathy, masses or thyromegaly Cardio: Normal A1 and S2, no S3 or S4. Rhythm is regular. No murmurs or rubs.   Pulm: Clear to auscultation bilaterally, no crackles, wheezing, or diminished breath sounds. Normal respiratory effort Abdomen: Bowel sounds normal. Abdomen soft and non-tender.  Extremities: Warm/ well perfused.  Strong radial pulses. Compression stocking over left leg, right leg covered by dressing. Neuro: Cranial nerves grossly intact  Brief Hospital Course:  Mr. to is a 67 year old man with a previous medical history of peripheral neuropathy and foot drop, chronic kidney disease, type 2 diabetes who was admitted to the hospital on 7/9 with an open angle ankle fracture (comminuted fibula fracture with associated medial malleolus fracture) after stumbling down from a ladder.  He was seen by orthopedics and subsequently had 2  operations which resulted in 2 tibial screws being placed to set the fracture.  Both operations were completed without complication(7/9,7/12).  After surgery patient was found to have a hemoglobin of 7.6 which was an acute change to his state of chronic anemia.  At that time he was transfused 2 units of blood without complication.  On 7/13 patient was found to have a small bowel obstruction.  An NG tube was inserted to decompress patient's bowel regimen was increased.  On 7/15 the NG tube removed the patient was progressed from clear fluids to full fluids.  The day of discharge he was eating soft foods without complication and had multiple bowel movements the evening prior.  Issues for Follow Up:  1. Patient to follow-up with nephrology as outpatient due to increase in creatinine. 2. Follow-up BMP for creatinine levels. Lisinopril and metformin held due to AKI and new elevated baseline creatinine. 3. Follow-up CBC for anemia.  Significant Procedures: Transfusion of 2 units of packed red blood cells, right ankle reduction on 7/9, irrigation debridement ORIF of medial malleolus and external fixation on 7/9, ORIF of right ankle fracture and right syndesmosis and removal of external fixator and hardware on 7/12  Significant Labs and Imaging:  Recent Labs  Lab 06/13/18 0316 06/14/18 0336 06/15/18 0407  WBC 9.9 6.8 6.6  HGB 10.4* 9.9* 9.5*  HCT 32.0* 30.8* 29.9*  PLT 224 219 210   Recent Labs  Lab 06/11/18 1557 06/12/18 0344 06/13/18 0316 06/14/18 0336 06/15/18 0407  NA 138 136 143 145 138  K 4.9 4.7 4.3 4.2 4.0  CL 104 105 105 108 103  CO2 20* 20* 26 26 26   GLUCOSE 210* 144* 114* 111* 123*  BUN  50* 48* 49* 51* 47*  CREATININE 4.21* 3.81* 3.93* 3.96* 3.61*  CALCIUM 8.5* 8.1* 8.8* 8.6* 8.4*     Results/Tests Pending at Time of Discharge: none  Discharge Medications:  Allergies as of 06/15/2018   No Known Allergies     Medication List    STOP taking these medications    lisinopril 2.5 MG tablet Commonly known as:  PRINIVIL,ZESTRIL   metFORMIN 500 MG tablet Commonly known as:  GLUCOPHAGE     TAKE these medications   simvastatin 20 MG tablet Commonly known as:  ZOCOR Take 20 mg by mouth every evening.   sodium bicarbonate 650 MG tablet Take 650 mg by mouth 2 (two) times daily.            Durable Medical Equipment  (From admission, onward)        Start     Ordered   06/14/18 1426  For home use only DME Walker rolling  Once    Question:  Patient needs a walker to treat with the following condition  Answer:  S/P ORIF (open reduction internal fixation) fracture   06/14/18 1426   06/14/18 1426  For home use only DME 3 n 1  Once     06/14/18 1426      Discharge Instructions: Please refer to Patient Instructions section of EMR for full details.  Patient was counseled important signs and symptoms that should prompt return to medical care, changes in medications, dietary instructions, activity restrictions, and follow up appointments.   Follow-Up Appointments: Follow-up Information    [facility]-Home Follow up.   Specialty:  [place] Why:  A representative from [facility] will contact you to arrange start date and time for your therapy. Contact information: [address] [place] [address] [phone]        Shona Needles, MD. Schedule an appointment as soon as possible for a visit in 1 week(s).   Specialty:  Orthopedic Surgery Why:  For surgery follow up. Contact information: [address] [place] [address] [phone]           Matilde Haymaker, MD 06/15/2018, 4:24 PM PGY-1, [place]

## 2018-06-12 NOTE — Progress Notes (Addendum)
Family Medicine Teaching Service Daily Progress Note Intern Pager: [phone]  Patient name: Demetris Capell Medical record number: [id number] Date of birth: 1951/07/08 Age: 67 y.o. Gender: male  Primary Care Provider: Patient, No Pcp Per Consultants: Ortho, Ortho Trauma Code Status: Full Code  Pt Overview and Major Events to Date:  7/9 Admitted with open right ankle fracture 7/9 Fracture reduced under sedation in ED 7/9 Irrigation, debridement, ORIF of medical malleolus and external fixation   Assessment and Plan:  Nameer Summer is a 67 y.o. male presenting with Open right ankle fracture . PMH is significant for T2DM, CIPD (chronic inflammatroy demyelinating polyneuropathy), HTN, HLD, chronic acidosis, CKD (baseline creatinine ~2.5)  Open Right Ankle Fracture: S/P ORIF of right ankle fracture and right syndesmosis, removal of external fixator and hardware S/P 2 surgeries, irrigation debridement of right open ankle fracture, ORIF of right ankle fracture and right syndesmosis.  Pain control per Ortho.  Patient rates current pain 3-4/10.  Denies constipation. -On Lovenox for DVT prophylaxis per Ortho recommendation -PT recommends home health PT with 24-hour supervision   AKI on chronic CKD w/ chronic acidosis: Slightly improved on IV fluids.  Baseline creatinine 2.3.  Today creatinine 3.81. On bicarb 650 mg twice daily at home.  Patient has never seen a nephrologist.  Spoke with nephrologist, they recommend continuing to hydrate patient and consider outpatient follow-up appropriate due to patient stable status. -Continue to hold metformin/lisinopril -Continue 1.5 maintenance IV fluids at 150 cc/h -Follow-up BMP in the morning -Follow-up with nephrology within 1 month post discharge. -Continue to avoid nephrotoxic agents -Continue home bicarb  Anemia: Acute on chronic Patient transfused 2 units of packed red blood cells on 7/11 when hemoglobin was 7.5.  Post transfusion hemoglobin was 7.9.   Expected rise would have been around 9.5.  Post surgery CBC, hemoglobin 8.5.  This a.m. hemoglobin 7.6.  2 units packed red blood cells ordered by Ortho this morning. -1 hour post transfusion H&H -Repeat CBC in a.m. -Follow-up with PCP for repeat CBC on discharge   Hyperkalemia, resolved:  This morning K 4.7 -Continue to monitor BMP daily -Continue IV fluids per AKI   T2DM: Chronic On metformin twice daily at home.  Glucose this morning 128 -New sSSI -Continue to hold metformin, will not continue as outpatient due to CKD  HLD: Chronic On simvastatin 20mg  at home -Continue simvastatin  HTN: Chronic.  Blood pressure this morning 142/81.  Takes lisinopril 2.5mg  QD at home -Continue to hold lisinopril in the setting of AKI -Restart as necessary and his creatinine allows  Hypocalcemia: Stable On vitamin D 1000, home dose -Continue Tums BID -Continue Vitamin D  History of Chronic inflammatory demyelinating polyneuropathy with chronic foot drop:  Stable History of multiple multiple fractures in right foot and chronic foot drop. -Follow-up PT/OT    FEN/GI: carb modified diet  PPx: Lovenox per Ortho  Disposition: Home  Subjective:  Overall, patient states that he is doing well.  Rates his current pain 3-4 out of 10.  Denies any constipation.  States that he has been working with therapy.  Objective: Temp:  [97.2 F (36.2 C)-98.4 F (36.9 C)] 98.1 F (36.7 C) (07/13 0410) Pulse Rate:  [79-99] 79 (07/13 0410) Resp:  [12-23] 17 (07/13 0410) BP: (141-165)/(81-92) 142/81 (07/13 0410) SpO2:  [94 %-98 %] 98 % (07/13 0410)  Physical exam: General: 67 year old male sitting up in bed, in no acute distress Cardio: Regular rate and rhythm, no murmurs, rubs, gallops Lungs: Clear to auscultation bilaterally,  no wheezes, rhonchi, crackles Abdomen: Soft, nontender to palpation, nondistended, positive bowel sounds Extremities: Right lower extremity with Ace bandage from toes up  to the knee, sensation intact in right toes, SCD on left leg, no edema  Laboratory: Recent Labs  Lab 06/11/18 0439 06/11/18 1121 06/11/18 1557 06/12/18 0344  WBC 5.9  --  6.6 8.9  HGB 7.9* 8.5* 8.5* 7.6*  HCT 25.9* 27.2* 26.7* 23.4*  PLT 198  --  196 187   Recent Labs  Lab 06/08/18 1432  06/11/18 0439 06/11/18 1557 06/12/18 0344  NA 139   < > 138 138 136  K 4.7   < > 4.4 4.9 4.7  CL 110   < > 109 104 105  CO2 17*   < > 20* 20* 20*  BUN 51*   < > 47* 50* 48*  CREATININE 4.44*   < > 3.95* 4.21* 3.81*  CALCIUM 8.2*   < > 8.1* 8.5* 8.1*  PROT 6.0*  --   --   --   --   BILITOT 0.6  --   --   --   --   ALKPHOS 54  --   --   --   --   ALT 18  --   --   --   --   AST 21  --   --   --   --   GLUCOSE 111*   < > 105* 210* 144*   < > = values in this interval not displayed.     Imaging/Diagnostic Tests: Dg Ankle Complete Right  Result Date: 06/11/2018 CLINICAL DATA:  Open reduction internal fixation ankle fracture RIGHT. Irrigation and debridement extremity RIGHT. Removal external fixation leg RIGHT. EXAM: RIGHT ANKLE - COMPLETE 3+ VIEW; DG C-ARM 61-120 MIN COMPARISON:  None. FINDINGS: Series of intraoperative fluoroscopic images demonstrate removal of external fixation hardware followed by plate and screw fixation of the distal tibia and fibula. On final images, hardware appears intact and appropriately position. Osseous alignment is significantly improved, anatomic. Ankle mortise is symmetric. Fluoroscopy was provided for 2 minutes and 13 seconds. IMPRESSION: Intraoperative films demonstrating plate and screw fixation of the distal RIGHT tibia and fibula, with significantly improved alignment. No evidence of surgical complicating feature. Electronically Signed   By: Franki Cabot M.D.   On: 06/11/2018 10:02   Dg Ankle Right Port  Result Date: 06/11/2018 CLINICAL DATA:  67 year old male with a history of ORIF right ankle EXAM: PORTABLE RIGHT ANKLE - 2 VIEW COMPARISON:  06/09/2018,  06/08/2018, 06/08/2018 FINDINGS: Overlying casting material somewhat obscures bony details, however, there has been interval removal of external fixation with interval plate and screw fixation of the comminuted distal fibular fracture, with transsyndesmotic fixation and cannulated interference screw fixation of the medial malleolar fracture. Relative anatomic alignment maintained. IMPRESSION: Interval ORIF and removal of the external fixation as above, with alignment relatively maintained. Electronically Signed   By: Corrie Mckusick D.O.   On: 06/11/2018 10:59   Dg C-arm 1-60 Min  Result Date: 06/11/2018 CLINICAL DATA:  Open reduction internal fixation ankle fracture RIGHT. Irrigation and debridement extremity RIGHT. Removal external fixation leg RIGHT. EXAM: RIGHT ANKLE - COMPLETE 3+ VIEW; DG C-ARM 61-120 MIN COMPARISON:  None. FINDINGS: Series of intraoperative fluoroscopic images demonstrate removal of external fixation hardware followed by plate and screw fixation of the distal tibia and fibula. On final images, hardware appears intact and appropriately position. Osseous alignment is significantly improved, anatomic. Ankle mortise is symmetric. Fluoroscopy was provided  for 2 minutes and 13 seconds. IMPRESSION: Intraoperative films demonstrating plate and screw fixation of the distal RIGHT tibia and fibula, with significantly improved alignment. No evidence of surgical complicating feature. Electronically Signed   By: Franki Cabot M.D.   On: 06/11/2018 10:02   Dg C-arm 1-60 Min  Result Date: 06/11/2018 CLINICAL DATA:  Open reduction internal fixation ankle fracture RIGHT. Irrigation and debridement extremity RIGHT. Removal external fixation leg RIGHT. EXAM: RIGHT ANKLE - COMPLETE 3+ VIEW; DG C-ARM 61-120 MIN COMPARISON:  None. FINDINGS: Series of intraoperative fluoroscopic images demonstrate removal of external fixation hardware followed by plate and screw fixation of the distal tibia and fibula. On final  images, hardware appears intact and appropriately position. Osseous alignment is significantly improved, anatomic. Ankle mortise is symmetric. Fluoroscopy was provided for 2 minutes and 13 seconds. IMPRESSION: Intraoperative films demonstrating plate and screw fixation of the distal RIGHT tibia and fibula, with significantly improved alignment. No evidence of surgical complicating feature. Electronically Signed   By: Franki Cabot M.D.   On: 06/11/2018 10:02    Rittberger, Bernita Raisin, DO 06/12/2018, 8:07 AM PGY-1, [place] Intern pager: [phone], text pages welcome

## 2018-06-12 NOTE — Progress Notes (Signed)
Paged by nurse.  Patient's BP 180/102 and patient had vomited.   He was seen and examined at bedside.  Patient complained of nausea and abdominal bloating.  Stated that he was unable to eat his dinner.  His nausea started few minutes post-transfusion.  He denied headache, changes in vision, chest pain, shortness of breath.  On exam, patient was afebrile, non-tachycardic, sating well on RA.  BP recheck on other arm, 177/98.  Eyes: PERRL Lungs: CTAB, no crackles Legs: No edema    Had nurse recheck BP in 10 minutes, was 166/95.  Patient given Pepcid, Tums, and IVF stopped.  Will continue to monitor.  Will consider medically correcting BP if does not resolve, or worsens.  Suspect fluid overload secondary to large amount of IVF and post 2 units transfused, doubt TRALI as lungs clear, doubt post-transfusion reaction as patient afebrile and hypertensive, not hypotensive.

## 2018-06-12 NOTE — Progress Notes (Addendum)
Family Medicine Teaching Service Daily Progress Note Intern Pager: [phone]  Patient name: [name] Medical record number: [id number] Date of birth: 19-Jan-1951 Age: 67 y.o. Gender: male  Primary Care Provider: Patient, No Pcp Per Consultants: Ortho, Ortho Trauma Code Status: Full Code  Pt Overview and Major Events to Date:  7/9 Admitted with open right ankle fracture, reduced under sedation in ED 7/9 Irrigation, debridement, ORIF of medical malleolus and external fixation  7/12 irrigation and debridement with removal of external fixator and hardware  Assessment and Plan: [name] is a 67 y.o. male presenting with Open right ankle fracture . PMH is significant for T2DM, chronic inflammatory demyelinating polyneuropathy, HTN, HLD, chronic acidosis, CKD (baseline creatinine ~2.5).   Right Ankle Fracture S/P ORIF of right ankle and syndesmosis on 06/08/18, with subsequent surgical removal of external fixator and hardware on 06/11/18.  - Pain control per Ortho: on oxyIR 5mg  for mild pain, 10mg  for moderate pain, 15mg  for severe pain q4prn with morphine 2mg  q1prn for breakthrough. Robaxin prn for muscle spasms - Per ortho, NWB and remain in splint with outpt follow up in 102 weeks - PT recs: Home health PT with 24-hour supervision  Partial SBO. Emesis output 2100cc overnight. Abd exam this morning improved with less distension and now passing gass. CT abd showing proximal SBO with transition point. No h/o abd surgeries per chart review. - General surgery consulted, recommended NG tube for decompression. Appreciate additional recommendations - dulcolax qd prn - colace BID scheduled - miralax previously prn, now daily scheduled - IV pepcid 20mg  qd - prn reglan  AKI on chronic CKD w/ chronic acidosis, improved. Cr peak 4.6 now 3.93, baseline 2.3. Likely at new baseline. On bicarb 650 mg twice daily at home.   -Holding metformin/lisinopril -Monitor BMP -Follow-up with nephrology within 1 month  post discharge. -Avoid nephrotoxic agents -Continue home bicarb  Anemia: Acute on chronic. Hgb post surgery drifted down to 7.6, of note patient did not receive 2U on 06/10/18 so received a total of 2U RBC only on 06/12/18. Post transfusion Hgb improved to 10.4  - monitor CBC  T2DM: Chronic. On metformin twice daily at home.  - on sSSI + qhs coverage - holding metformin, will not continue as outpatient due to CKD - a1c pending  HLD: Chronic.  -Continue home simvastatin 20mg  qd  HTN: Chronic.  BP elevated overnight to 180/102 in the setting of vomiting, improved to 156/86. Takes lisinopril 2.5mg  QD at home -Holding lisinopril in the setting of AKI, restart as necessary and if creatinine allows - monitor vitals  Hypocalcemia: Stable On vitamin D 1000, home dose -Continue Tums BID  History of Chronic inflammatory demyelinating polyneuropathy with chronic foot drop:  Stable History of multiple multiple fractures in right foot and chronic foot drop. - PT rec HHPT with 24h supervision   FEN/GI: NPO  PPx: Lovenox   Disposition: Home  Subjective:  States that has started to pass gas this morning. No abdominal pain. Last emesis was after oral contrast and none since. Feeling a little better.   Objective: Temp:  [97.5 F (36.4 C)-99.8 F (37.7 C)] 99.8 F (37.7 C) (07/14 0400) Pulse Rate:  [70-94] 94 (07/14 0400) Resp:  [16-22] 18 (07/14 0400) BP: (131-180)/(75-102) 156/86 (07/14 0400) SpO2:  [93 %-98 %] 95 % (07/14 0400)  Physical exam: General: Laying in bed, in NAD Cardio: RRR, no murmurs Lungs: CTAB, normal effort on room air Abdomen: soft, mildly distended, nontender, hypoactive bs  Extremities: Right lower  extremity with Ace bandage from toes up to the knee, sensation intact in right toes, SCD on left leg, no edema  Laboratory: Recent Labs  Lab 06/11/18 1557 06/12/18 0344 06/12/18 2008 06/13/18 0316  WBC 6.6 8.9  --  9.9  HGB 8.5* 7.6* 9.7* 10.4*  HCT 26.7*  23.4* 29.3* 32.0*  PLT 196 187  --  224   Recent Labs  Lab 06/08/18 1432  06/11/18 1557 06/12/18 0344 06/13/18 0316  NA 139   < > 138 136 143  K 4.7   < > 4.9 4.7 4.3  CL 110   < > 104 105 105  CO2 17*   < > 20* 20* 26  BUN 51*   < > 50* 48* 49*  CREATININE 4.44*   < > 4.21* 3.81* 3.93*  CALCIUM 8.2*   < > 8.5* 8.1* 8.8*  PROT 6.0*  --   --   --   --   BILITOT 0.6  --   --   --   --   ALKPHOS 54  --   --   --   --   ALT 18  --   --   --   --   AST 21  --   --   --   --   GLUCOSE 111*   < > 210* 144* 114*   < > = values in this interval not displayed.     Imaging/Diagnostic Tests: Ct Abdomen Pelvis Wo Contrast  Result Date: 06/13/2018 CLINICAL DATA:  Evaluate bowel obstruction. EXAM: CT ABDOMEN AND PELVIS WITHOUT CONTRAST TECHNIQUE: Multidetector CT imaging of the abdomen and pelvis was performed following the standard protocol without IV contrast. COMPARISON:  06/12/2018 FINDINGS: Lower chest: Airspace consolidation within the lingula identified. Bilateral lower lobe subsegmental atelectasis and posterior pleural thickening identified. Hepatobiliary: Tiny stones layering within the dependent portion of the gallbladder noted. No focal liver abnormality identified. No biliary dilatation. Pancreas: Normal appearance of the pancreas. Spleen: Spleen is unremarkable. Adrenals/Urinary Tract: The adrenal glands are normal. Bilateral kidney stones are noted. The largest stone is in the inferior pole collecting system of the left kidney measuring 5 mm. No hydronephrosis. Urinary bladder appears normal. Stomach/Bowel: The stomach appears normal. The proximal small bowel loops are dilated measuring up to 4.2 cm containing air-fluid levels. Transition to decreased caliber small bowel loops noted in the left lower quadrant of the abdomen, image 68/3. Normal caliber of the distal jejunum and ileum. Terminal ileum appears normal. No pathologic dilatation of the colon Vascular/Lymphatic: Aortic  atherosclerosis. No upper abdominal adenopathy. No pelvic or inguinal adenopathy. Reproductive: Prostate is unremarkable. Other: No abdominal wall hernia or abnormality. No abdominopelvic ascites. Musculoskeletal: Lumbar degenerative disc disease. No aggressive lytic or sclerotic bone lesions. IMPRESSION: 1. Exam positive for proximal small bowel obstruction with transition point in the left lower quadrant of the abdomen. 2. No evidence for bowel perforation.  No abscess identified. 3. Lingular pneumonia with bibasilar atelectasis. 4. Gallstones. 5.  Aortic Atherosclerosis (ICD10-I70.0). Electronically Signed   By: Kerby Moors M.D.   On: 06/13/2018 07:47   Dg Abd 1 View  Result Date: 06/12/2018 CLINICAL DATA:  67 year old male with abdominal distention and vomiting. EXAM: ABDOMEN - 1 VIEW COMPARISON:  None. FINDINGS: There are two adjacent air-filled segments of the colon in the right hemiabdomen measuring up to 8.5 cm in diameter. This may represent a air distended sigmoid colon. A sigmoid volvulus is not entirely excluded. Further evaluation with CT is recommended.  Air is noted throughout the colon. There is paucity of small bowel air. No definite free air identified. No radiopaque calculi. The osseous structures and soft tissues are grossly unremarkable. IMPRESSION: 1. Constipation.  No evidence of small-bowel obstruction. 2. Air distended redundant sigmoid colon. A sigmoid volvulus is not entirely excluded. Further evaluation with CT is recommended. These results were called by telephone at the time of interpretation on 06/12/2018 at 11:03 pm to nurse Leight, who verbally acknowledged these results. Electronically Signed   By: Anner Crete M.D.   On: 06/12/2018 23:35    Bufford Lope, DO 06/13/2018, 6:13 AM PGY-3, [place] Intern pager: [phone], text pages welcome

## 2018-06-12 NOTE — Progress Notes (Signed)
Patient ID: [name], male   DOB: March 22, 1951, 67 y.o.   MRN: [id number]     Subjective:  Patient reports pain as mild.  Patient in bed and in no acute distress.  He denies any CP or SOB.  Objective:   VITALS:   Vitals:   06/11/18 1631 06/11/18 2006 06/12/18 0014 06/12/18 0410  BP: (!) 147/90 (!) 143/85 (!) 141/82 (!) 142/81  Pulse: 98 91 86 79  Resp:  17 17 17   Temp: 98.3 F (36.8 C) 98.2 F (36.8 C) 98.4 F (36.9 C) 98.1 F (36.7 C)  TempSrc: Oral Oral Oral Oral  SpO2: 95% 97% 97% 98%  Weight:      Height:        ABD soft Sensation intact distally Dorsiflexion/Plantar flexion intact Incision: dressing C/D/I and no drainage Short leg splint in place  Lab Results  Component Value Date   WBC 8.9 06/12/2018   HGB 7.6 (L) 06/12/2018   HCT 23.4 (L) 06/12/2018   MCV 96.7 06/12/2018   PLT 187 06/12/2018   BMET    Component Value Date/Time   NA 136 06/12/2018 0344   K 4.7 06/12/2018 0344   CL 105 06/12/2018 0344   CO2 20 (L) 06/12/2018 0344   GLUCOSE 144 (H) 06/12/2018 0344   BUN 48 (H) 06/12/2018 0344   CREATININE 3.81 (H) 06/12/2018 0344   CALCIUM 8.1 (L) 06/12/2018 0344   GFRNONAA 15 (L) 06/12/2018 0344   GFRAA 17 (L) 06/12/2018 0344     Assessment/Plan: 1 Day Post-Op   Active Problems:   Open right ankle fracture   Fracture, ankle   Open displaced fracture of medial malleolus of tibia, type III   AKI (acute kidney injury) ([place])   Dehydration   Type 2 diabetes mellitus ([place])   Peripheral neuropathy   Ankle syndesmosis disruption, right, initial encounter   Advance diet Up with therapy ABLA plan for 2 units PRBC's NWB right lower ext Continue plan per medicine    [name] 06/12/2018, 7:27 AM   Marchia Bond, MD Cell [phone]

## 2018-06-12 NOTE — Progress Notes (Signed)
Physical Therapy Treatment Patient Details Name: [name] MRN: [id number] DOB: [birth year] Today's Date: 06/12/2018    History of Present Illness Pt is a 67 y/o male admitted after falling from ladder status post a type III a open ankle fracture dislocation status post external fixation and ORIF of medial malleolus. Plan for return to the operating room for repeat I&D and likely ORIF of fibula and revision fixation of his medial malleolus.  PMH significant for but not limited to: DM2, CKD, CIDP.     PT Comments    Patient is making progress toward PT goals and tolerated session well. Pt would benefit from further stair training especially with assist of family prior to d/c home. Continue to progress as tolerated.    Follow Up Recommendations  Home health PT;Supervision/Assistance - 24 hour     Equipment Recommendations  Rolling walker with 5" wheels    Recommendations for Other Services       Precautions / Restrictions Precautions Precautions: Fall Restrictions Weight Bearing Restrictions: Yes RLE Weight Bearing: Non weight bearing    Mobility  Bed Mobility Overal bed mobility: Modified Independent Bed Mobility: Supine to Sit;Sit to Supine           General bed mobility comments: increased time and effort  Transfers Overall transfer level: Needs assistance Equipment used: Rolling walker (2 wheeled) Transfers: Sit to/from Stand Sit to Stand: Min guard;From elevated surface;Min assist         General transfer comment: cues for safe hand placement   Ambulation/Gait Ambulation/Gait assistance: Min guard Gait Distance (Feet): 120 Feet Assistive device: Rolling walker (2 wheeled) Gait Pattern/deviations: Step-to pattern Gait velocity: decreased   General Gait Details: slow, grossly steady gait and able to maintain NWB status   Stairs   Stairs assistance: Mod assist Stair Management: No rails;Step to pattern;Backwards;With walker Number of Stairs: 2 General  stair comments: cues for sequencing/technique and safety; pt attempted to use R hand on RW and L hand rail to ascend second step; assistance to stabilize RW and for balance    Wheelchair Mobility    Modified Rankin (Stroke Patients Only)       Balance Overall balance assessment: Needs assistance Sitting-balance support: Feet unsupported;No upper extremity supported Sitting balance-Leahy Scale: Good     Standing balance support: Bilateral upper extremity supported;During functional activity Standing balance-Leahy Scale: Poor Standing balance comment: reliant on bilateral UEs on RW                            Cognition Arousal/Alertness: Awake/alert Behavior During Therapy: WFL for tasks assessed/performed Overall Cognitive Status: Within Functional Limits for tasks assessed                                        Exercises      General Comments        Pertinent Vitals/Pain Pain Assessment: Faces Faces Pain Scale: Hurts a little bit Pain Location: R ankle Pain Descriptors / Indicators: Operative site guarding Pain Intervention(s): Monitored during session;Premedicated before session;Repositioned    Home Living                      Prior Function            PT Goals (current goals can now be found in the care plan section) Acute Rehab PT Goals PT  Goal Formulation: With patient Time For Goal Achievement: 06/24/18 Potential to Achieve Goals: Good Progress towards PT goals: Progressing toward goals    Frequency    Min 3X/week      PT Plan Current plan remains appropriate    Co-evaluation              AM-PAC PT "6 Clicks" Daily Activity  Outcome Measure  Difficulty turning over in bed (including adjusting bedclothes, sheets and blankets)?: A Little Difficulty moving from lying on back to sitting on the side of the bed? : A Little Difficulty sitting down on and standing up from a chair with arms (e.g., wheelchair,  bedside commode, etc,.)?: Unable Help needed moving to and from a bed to chair (including a wheelchair)?: A Little Help needed walking in hospital room?: A Little Help needed climbing 3-5 steps with a railing? : A Lot 6 Click Score: 15    End of Session Equipment Utilized During Treatment: Gait belt Activity Tolerance: Patient tolerated treatment well Patient left: in bed;with call bell/phone within reach;with nursing/sitter in room;with SCD's reapplied;Other (comment)(SCD L LE) Nurse Communication: Mobility status PT Visit Diagnosis: Other abnormalities of gait and mobility (R26.89);Pain Pain - Right/Left: Right Pain - part of body: Ankle and joints of foot     Time: 0838-0900 PT Time Calculation (min) (ACUTE ONLY): 22 min  Charges:  $Gait Training: 8-22 mins                    G Codes:       Earney Navy, PTA Pager: [phone]     Darliss Cheney 06/12/2018, 9:40 AM

## 2018-06-12 NOTE — Progress Notes (Signed)
Paged by nurse that patient no longer vomiting but that complaining of some slight belly distention.  Per RN exam no bowel sounds appreciated.  Blood pressure improved at 151/84.  Patient seen and examined at bedside.  He is well-appearing, no longer vomiting, denies nausea.  He states that he feels his abdomen is slightly bigger than normal but no abdominal pain.  On my exam there hypoactive bowel sounds and abdomen is slightly distended.  Will get KUB tonight.  Also of note, patient did not receive 2 units of RBC on 06/10/2018.  These 2 units were prepared in case patient required however they were not administered.  This was confirmed by nursing who called blood bank.  He is receiving 2 units currently.  Will follow-up on posttransfusion H&H.  Bufford Lope, DO PGY-3, [facility] 06/12/2018 9:33 PM

## 2018-06-13 ENCOUNTER — Inpatient Hospital Stay (HOSPITAL_COMMUNITY): Payer: Medicare Other

## 2018-06-13 DIAGNOSIS — K56609 Unspecified intestinal obstruction, unspecified as to partial versus complete obstruction: Secondary | ICD-10-CM

## 2018-06-13 LAB — CBC
HCT: 32 % — ABNORMAL LOW (ref 39.0–52.0)
Hemoglobin: 10.4 g/dL — ABNORMAL LOW (ref 13.0–17.0)
MCH: 31 pg (ref 26.0–34.0)
MCHC: 32.5 g/dL (ref 30.0–36.0)
MCV: 95.5 fL (ref 78.0–100.0)
Platelets: 224 10*3/uL (ref 150–400)
RBC: 3.35 MIL/uL — ABNORMAL LOW (ref 4.22–5.81)
RDW: 14.1 % (ref 11.5–15.5)
WBC: 9.9 10*3/uL (ref 4.0–10.5)

## 2018-06-13 LAB — BASIC METABOLIC PANEL
Anion gap: 12 (ref 5–15)
BUN: 49 mg/dL — ABNORMAL HIGH (ref 8–23)
CO2: 26 mmol/L (ref 22–32)
Calcium: 8.8 mg/dL — ABNORMAL LOW (ref 8.9–10.3)
Chloride: 105 mmol/L (ref 98–111)
Creatinine, Ser: 3.93 mg/dL — ABNORMAL HIGH (ref 0.61–1.24)
GFR calc Af Amer: 17 mL/min — ABNORMAL LOW (ref >60)
GFR calc non Af Amer: 14 mL/min — ABNORMAL LOW (ref >60)
Glucose, Bld: 114 mg/dL — ABNORMAL HIGH (ref 70–99)
Potassium: 4.3 mmol/L (ref 3.5–5.1)
Sodium: 143 mmol/L (ref 135–145)

## 2018-06-13 LAB — BPAM RBC
Blood Product Expiration Date: 201908022359
Blood Product Expiration Date: 201908022359
ISSUE DATE / TIME: 201907130837
ISSUE DATE / TIME: 201907131247
Unit Type and Rh: 6200
Unit Type and Rh: 6200

## 2018-06-13 LAB — TYPE AND SCREEN
ABO/RH(D): A POS
Antibody Screen: NEGATIVE
Unit division: 0
Unit division: 0

## 2018-06-13 LAB — HEMOGLOBIN A1C
Hgb A1c MFr Bld: 6.6 % — ABNORMAL HIGH (ref 4.8–5.6)
Mean Plasma Glucose: 142.72 mg/dL

## 2018-06-13 LAB — GLUCOSE, CAPILLARY
Glucose-Capillary: 118 mg/dL — ABNORMAL HIGH (ref 70–99)
Glucose-Capillary: 98 mg/dL (ref 70–99)
Glucose-Capillary: 122 mg/dL — ABNORMAL HIGH (ref 70–99)
Glucose-Capillary: 95 mg/dL (ref 70–99)

## 2018-06-13 MED ORDER — DIATRIZOATE MEGLUMINE & SODIUM 66-10 % PO SOLN
90.0000 mL | Freq: Once | ORAL | Status: AC
  Administered 2018-06-13: 90 mL via NASOGASTRIC
  Filled 2018-06-13: qty 90

## 2018-06-13 MED ORDER — SODIUM CHLORIDE 0.9 % IV SOLN
INTRAVENOUS | Status: AC
  Administered 2018-06-13: 23:00:00 via INTRAVENOUS

## 2018-06-13 MED ORDER — IOPAMIDOL (ISOVUE-300) INJECTION 61%
INTRAVENOUS | Status: AC
  Administered 2018-06-13: 01:00:00
  Filled 2018-06-13: qty 30

## 2018-06-13 NOTE — Progress Notes (Signed)
Physical Therapy Treatment Patient Details Name: [name] MRN: [id number] DOB: Mar 02, 1951 Today's Date: 06/13/2018    History of Present Illness Pt is a 67 y/o male admitted after falling from ladder status post a type III a open ankle fracture dislocation status post external fixation and ORIF of medial malleolus. Plan for return to the operating room for repeat I&D and likely ORIF of fibula and revision fixation of his medial malleolus.  PMH significant for but not limited to: DM2, CKD, CIDP.     PT Comments    Continuing work on functional mobility and activity tolerance;  Pt requesting to hold on review of stair training due to NG Tube; decr activity tolerance, and declining progressive amb in the hallway today; will continue efforts at mobilizing    Follow Up Recommendations  Home health PT;Supervision/Assistance - 24 hour     Equipment Recommendations  Rolling walker with 5" wheels;3in1 (PT)    Recommendations for Other Services       Precautions / Restrictions Precautions Precautions: Fall Restrictions RLE Weight Bearing: Non weight bearing    Mobility  Bed Mobility Overal bed mobility: Modified Independent                Transfers Overall transfer level: Needs assistance Equipment used: Rolling walker (2 wheeled) Transfers: Sit to/from Stand Sit to Stand: Min guard;From elevated surface;Min assist         General transfer comment: cues for safe hand placement; Needs reinforcement here; did not reach back for armrests and had an uncontrolled descent to sit to recliner  Ambulation/Gait Ambulation/Gait assistance: Min guard;Min assist Gait Distance (Feet): 20 Feet(to and from bathroom) Assistive device: Rolling walker (2 wheeled) Gait Pattern/deviations: Step-to pattern Gait velocity: decreased   General Gait Details: slow, grossly steady gait and able to maintain NWB status; one instance on min assist to steady due to loss of balance with  turn   Stairs             Wheelchair Mobility    Modified Rankin (Stroke Patients Only)       Balance                                            Cognition Arousal/Alertness: Awake/alert Behavior During Therapy: WFL for tasks assessed/performed Overall Cognitive Status: Within Functional Limits for tasks assessed                                 General Comments: Difficulty speakin gwith NGTube; using pen and paper to communicate when unable to speak      Exercises      General Comments General comments (skin integrity, edema, etc.): Pt did not feel like walking far or going up and down steps today; NG tube was new to him and uncomfortable      Pertinent Vitals/Pain Pain Assessment: 0-10 Pain Score: 2  Pain Location: R ankle Pain Descriptors / Indicators: Operative site guarding Pain Intervention(s): Monitored during session(NG tube more uncomfortable than ankle)    Home Living                      Prior Function            PT Goals (current goals can now be found in the care plan section) Acute Rehab PT  Goals Patient Stated Goal: return home PT Goal Formulation: With patient Time For Goal Achievement: 06/24/18 Potential to Achieve Goals: Good Progress towards PT goals: Progressing toward goals(slowly, limited by SBO)    Frequency    Min 3X/week      PT Plan Current plan remains appropriate    Co-evaluation              AM-PAC PT "6 Clicks" Daily Activity  Outcome Measure  Difficulty turning over in bed (including adjusting bedclothes, sheets and blankets)?: A Little Difficulty moving from lying on back to sitting on the side of the bed? : A Little Difficulty sitting down on and standing up from a chair with arms (e.g., wheelchair, bedside commode, etc,.)?: A Lot Help needed moving to and from a bed to chair (including a wheelchair)?: A Little Help needed walking in hospital room?: A  Little Help needed climbing 3-5 steps with a railing? : A Lot 6 Click Score: 16    End of Session Equipment Utilized During Treatment: Gait belt Activity Tolerance: Patient tolerated treatment well Patient left: in chair;with call bell/phone within reach Nurse Communication: Mobility status;Other (comment)(and reconnect NG tube to suction) PT Visit Diagnosis: Other abnormalities of gait and mobility (R26.89);Pain Pain - Right/Left: Right Pain - part of body: Ankle and joints of foot     Time: 1441-1500 PT Time Calculation (min) (ACUTE ONLY): 19 min  Charges:  $Gait Training: 8-22 mins                    G Codes:       Roney Marion, PT  Acute Rehabilitation Services Pager [phone] Office [place] 06/13/2018, 4:02 PM

## 2018-06-13 NOTE — Progress Notes (Signed)
0345 pt drank 500cc of contrast and just vomited 500 cc of green emesis. Taken for CT exam.

## 2018-06-13 NOTE — Plan of Care (Signed)

## 2018-06-13 NOTE — Progress Notes (Signed)
OT Cancellation Note  Patient Details Name: [name] MRN: [id number] DOB: 01/18/1951   Cancelled Treatment:    Reason Eval/Treat Not Completed: Medical issues which prohibited therapy;Patient at procedure or test/ unavailable.  Per RN hold therapies this a.m. As pt. Is having blakemore tube placement.  Will check back as pt. Medically Able to tolerate participation in skilled OT.    Janice Coffin, COTA/L 06/13/2018, 8:08 AM

## 2018-06-13 NOTE — Progress Notes (Signed)
Dr. Shawna Orleans notified of radiologist report of possible volvulus and need for CT exam.  Pt vomited 800 cc of green emesis. No audible Bowel sounds. Abdomen still slightly distended and tender. Pt states it feels like he needs to have BM. Checked for impaction. No stool present in rectum. Last BM was on 7/11.

## 2018-06-13 NOTE — Progress Notes (Signed)
Paged by RN that KUB showing constipation and air distended redundant sigmoid colon concerning for possible sigmoid volvulus.  Patient has continued to have profuse emesis with undigested food.  RN rectal exam did not have any stool in rectal vault.  Vitals stable.  Patient is reportedly still well-appearing.  Will get further evaluation with CT, would like to avoid IV contrast as patient has significant AKI.  Bufford Lope, DO PGY-3, [facility] 06/13/2018 12:05 AM

## 2018-06-13 NOTE — Progress Notes (Signed)
NG tube inserted and connected to intermittent low suction this AM, Pt tolerated well with some discomfort. Held PO meds, MD notified. Total NG tube output from placement to 19:00 = 250cc. No nausea and vomiting since NGT placement. Pt worked with PT and up in the chair, denies surgical and epigastric pain. Family at bedside and was given updates. Follow-up done for xray scheduled at 21:30. Will continue to monitor, endorsed accordingly.

## 2018-06-13 NOTE — Progress Notes (Signed)
Xray tech brought oral solution for CT scan to floor. One bottle given to patient and told to sip on it. CT scheduled for 3 a.m.

## 2018-06-13 NOTE — Consult Note (Signed)
[facility] Surgery Consult Note  [name] [birth year]  [id number].    Requesting MD: Ardelia Mems, MD  Chief Complaint/Reason for Consult: SBO HPI:  [name] is a 67 year old male with multiple medical problems admitted on 06/08/2018 for management of open ankle fracture.  On 7/13 the patient developed abdominal distention and large volume, bilious, nonbloody emesis.  Abdominal films and CT scan of the abdomen and pelvis were ordered significant for small bowel obstruction with a transition point in the left hemiabdomen.  Patient endorses abdominal bloating and discomfort but denies cramping abdominal pain.  Pain improved status post nasogastric tube placement.  Patient reports last episode of flatus this morning. Last bowel movement was 7/11.  Patient denies a history of similar symptoms, history of bowel obstruction, history of abdominal surgery.  Denies a history of abnormal colonoscopy or abdominal hernias.  Denies a history of inflammatory bowel disease or diverticulitis.  Patient is a never smoker, denies illicit drug use, and reports occasional alcohol use.  ROS: Review of Systems  Constitutional: Negative for chills and fever.  Respiratory: Negative for shortness of breath.   Cardiovascular: Negative for chest pain.  Gastrointestinal: Positive for abdominal pain, nausea and vomiting. Negative for blood in stool and melena.  Genitourinary: Negative.   All other systems reviewed and are negative.   History reviewed. No pertinent family history.  Past Medical History:  Diagnosis Date  . Diabetes mellitus without complication [facility])     Past Surgical History:  Procedure Laterality Date  . EXTERNAL FIXATION LEG Right 06/08/2018   Procedure: EXTERNAL FIXATION COMMINUTED FIBULA FRACTURE;  Surgeon: Netta Cedars, MD;  Location: [place];  Service: Orthopedics;  Laterality: Right;  . I&D EXTREMITY Right 06/08/2018   Procedure: IRRIGATION AND DEBRIDEMENT OPEN TIBIA FRACTURE;  Surgeon: Netta Cedars, MD;  Location: [place];  Service: Orthopedics;  Laterality: Right;  . NOSE SURGERY    . ORIF ANKLE FRACTURE Right 06/08/2018   Procedure: OPEN REDUCTION INTERNAL FIXATION (ORIF) MEDIAL MALLEOLUS;  Surgeon: Netta Cedars, MD;  Location: [place];  Service: Orthopedics;  Laterality: Right;  . TIBIA DEBRIDEMENT Right 06/08/2018    Social History:  reports that he has never smoked. He has never used smokeless tobacco. He reports that he drinks alcohol. He reports that he does not use drugs.  Allergies: No Known Allergies  Medications Prior to Admission  Medication Sig Dispense Refill  . lisinopril (PRINIVIL,ZESTRIL) 2.5 MG tablet Take 2.5 mg by mouth daily.  0  . metFORMIN (GLUCOPHAGE) 500 MG tablet Take 500 mg by mouth daily.  0  . simvastatin (ZOCOR) 20 MG tablet Take 20 mg by mouth every evening.  0  . sodium bicarbonate 650 MG tablet Take 650 mg by mouth 2 (two) times daily.  0    Blood pressure (!) 156/86, pulse 94, temperature 99.8 F (37.7 C), temperature source Oral, resp. rate 18, height 6' 2"  (1.88 m), weight 95.3 kg (210 lb), SpO2 95 %. Physical Exam: Physical Exam  Constitutional: He is oriented to person, place, and time. He appears well-developed and well-nourished. No distress.  HENT:  Head: Normocephalic and atraumatic.  Right Ear: External ear normal.  Left Ear: External ear normal.  NG tube in place with small amount of bilious/clear liquid in canister  Eyes: Pupils are equal, round, and reactive to light. EOM are normal. Right eye exhibits no discharge. Left eye exhibits no discharge.  Neck: Normal range of motion. Neck supple. No tracheal deviation present. No thyromegaly present.  Cardiovascular: Normal  rate, regular rhythm and normal heart sounds. Exam reveals no gallop and no friction rub.  No murmur heard. Pulmonary/Chest: Effort normal and breath sounds normal. No stridor. No respiratory distress. He has no wheezes. He has no rales.  Abdominal: Soft. Bowel  sounds are normal. He exhibits no distension and no mass. There is no tenderness. There is no rebound and no guarding. No hernia.  Musculoskeletal: Normal range of motion. He exhibits tenderness. He exhibits no deformity.  Right lower extremity with splint in place  Neurological: He is alert and oriented to person, place, and time. No cranial nerve deficit.  Skin: Skin is warm and dry. No rash noted. He is not diaphoretic.  Psychiatric: He has a normal mood and affect. His behavior is normal. Judgment and thought content normal.    Results for orders placed or performed during the hospital encounter of 06/08/18 (from the past 48 hour(s))  Hemoglobin and hematocrit, blood     Status: Abnormal   Collection Time: 06/11/18 11:21 AM  Result Value Ref Range   Hemoglobin 8.5 (L) 13.0 - 17.0 g/dL   HCT 27.2 (L) 39.0 - 52.0 %    Comment: Performed at [facility], [address] [place], [place] [address]  Glucose, capillary     Status: Abnormal   Collection Time: 06/11/18 11:32 AM  Result Value Ref Range   Glucose-Capillary 150 (H) 70 - 99 mg/dL  CBC     Status: Abnormal   Collection Time: 06/11/18  3:57 PM  Result Value Ref Range   WBC 6.6 4.0 - 10.5 K/uL   RBC 2.71 (L) 4.22 - 5.81 MIL/uL   Hemoglobin 8.5 (L) 13.0 - 17.0 g/dL   HCT 26.7 (L) 39.0 - 52.0 %   MCV 98.5 78.0 - 100.0 fL   MCH 31.4 26.0 - 34.0 pg   MCHC 31.8 30.0 - 36.0 g/dL   RDW 12.7 11.5 - 15.5 %   Platelets 196 150 - 400 K/uL    Comment: Performed at [facility], [place] [address] [place], [place] [address]  Basic metabolic panel     Status: Abnormal   Collection Time: 06/11/18  3:57 PM  Result Value Ref Range   Sodium 138 135 - 145 mmol/L   Potassium 4.9 3.5 - 5.1 mmol/L   Chloride 104 98 - 111 mmol/L    Comment: Please note change in reference range.   CO2 20 (L) 22 - 32 mmol/L   Glucose, Bld 210 (H) 70 - 99 mg/dL    Comment: Please note change in reference range.   BUN 50 (H) 8 - 23 mg/dL    Comment:  Please note change in reference range.   Creatinine, Ser 4.21 (H) 0.61 - 1.24 mg/dL   Calcium 8.5 (L) 8.9 - 10.3 mg/dL   GFR calc non Af Amer 13 (L) >60 mL/min   GFR calc Af Amer 15 (L) >60 mL/min    Comment: (NOTE) The eGFR has been calculated using the CKD EPI equation. This calculation has not been validated in all clinical situations. eGFR's persistently <60 mL/min signify possible Chronic Kidney Disease.    Anion gap 14 5 - 15    Comment: Performed at [facility] [address], [place], [place] [address]  Glucose, capillary     Status: Abnormal   Collection Time: 06/11/18  4:26 PM  Result Value Ref Range   Glucose-Capillary 190 (H) 70 - 99 mg/dL  Glucose, capillary  Status: Abnormal   Collection Time: 06/11/18  9:25 PM  Result Value Ref Range   Glucose-Capillary 158 (H) 70 - 99 mg/dL  Basic metabolic panel     Status: Abnormal   Collection Time: 06/12/18  3:44 AM  Result Value Ref Range   Sodium 136 135 - 145 mmol/L   Potassium 4.7 3.5 - 5.1 mmol/L   Chloride 105 98 - 111 mmol/L    Comment: Please note change in reference range.   CO2 20 (L) 22 - 32 mmol/L   Glucose, Bld 144 (H) 70 - 99 mg/dL    Comment: Please note change in reference range.   BUN 48 (H) 8 - 23 mg/dL    Comment: Please note change in reference range.   Creatinine, Ser 3.81 (H) 0.61 - 1.24 mg/dL   Calcium 8.1 (L) 8.9 - 10.3 mg/dL   GFR calc non Af Amer 15 (L) >60 mL/min   GFR calc Af Amer 17 (L) >60 mL/min    Comment: (NOTE) The eGFR has been calculated using the CKD EPI equation. This calculation has not been validated in all clinical situations. eGFR's persistently <60 mL/min signify possible Chronic Kidney Disease.    Anion gap 11 5 - 15    Comment: Performed at [facility] [address], [place], [place] [address]  CBC     Status: Abnormal   Collection Time: 06/12/18  3:44 AM  Result Value Ref Range   WBC 8.9 4.0 - 10.5 K/uL   RBC 2.42 (L) 4.22 - 5.81 MIL/uL   Hemoglobin  7.6 (L) 13.0 - 17.0 g/dL   HCT 23.4 (L) 39.0 - 52.0 %   MCV 96.7 78.0 - 100.0 fL   MCH 31.4 26.0 - 34.0 pg   MCHC 32.5 30.0 - 36.0 g/dL   RDW 12.7 11.5 - 15.5 %   Platelets 187 150 - 400 K/uL    Comment: Performed at [facility], [place] [address], [place], [place] [address]  Glucose, capillary     Status: Abnormal   Collection Time: 06/12/18  6:31 AM  Result Value Ref Range   Glucose-Capillary 128 (H) 70 - 99 mg/dL  Prepare RBC     Status: None   Collection Time: 06/12/18  8:01 AM  Result Value Ref Range   Order Confirmation      ORDER PROCESSED BY BLOOD BANK Performed at [facility], [place] [address] [place], [place] [address]   Glucose, capillary     Status: Abnormal   Collection Time: 06/12/18 12:05 PM  Result Value Ref Range   Glucose-Capillary 113 (H) 70 - 99 mg/dL  Glucose, capillary     Status: Abnormal   Collection Time: 06/12/18  4:23 PM  Result Value Ref Range   Glucose-Capillary 112 (H) 70 - 99 mg/dL  Hemoglobin and hematocrit, blood     Status: Abnormal   Collection Time: 06/12/18  8:08 PM  Result Value Ref Range   Hemoglobin 9.7 (L) 13.0 - 17.0 g/dL    Comment: POST TRANSFUSION SPECIMEN   HCT 29.3 (L) 39.0 - 52.0 %    Comment: Performed at [facility] [address], [place], [place] [address]  Glucose, capillary     Status: Abnormal   Collection Time: 06/12/18  9:24 PM  Result Value Ref Range   Glucose-Capillary 122 (H) 70 - 99 mg/dL  Basic metabolic panel     Status: Abnormal   Collection Time: 06/13/18  3:16 AM  Result Value Ref Range   Sodium 143 135 - 145 mmol/L    Comment: DELTA CHECK NOTED   Potassium 4.3 3.5 - 5.1 mmol/L   Chloride 105 98 - 111 mmol/L    Comment: Please note change in reference range.   CO2 26 22 - 32 mmol/L   Glucose, Bld 114 (H) 70 - 99 mg/dL    Comment: Please note change in reference range.   BUN 49 (H) 8 - 23 mg/dL    Comment: Please note change in reference range.   Creatinine, Ser 3.93 (H) 0.61 - 1.24  mg/dL   Calcium 8.8 (L) 8.9 - 10.3 mg/dL   GFR calc non Af Amer 14 (L) >60 mL/min   GFR calc Af Amer 17 (L) >60 mL/min    Comment: (NOTE) The eGFR has been calculated using the CKD EPI equation. This calculation has not been validated in all clinical situations. eGFR's persistently <60 mL/min signify possible Chronic Kidney Disease.    Anion gap 12 5 - 15    Comment: Performed at [facility] [address], [place], [place] [address]  CBC     Status: Abnormal   Collection Time: 06/13/18  3:16 AM  Result Value Ref Range   WBC 9.9 4.0 - 10.5 K/uL   RBC 3.35 (L) 4.22 - 5.81 MIL/uL   Hemoglobin 10.4 (L) 13.0 - 17.0 g/dL   HCT 32.0 (L) 39.0 - 52.0 %   MCV 95.5 78.0 - 100.0 fL   MCH 31.0 26.0 - 34.0 pg   MCHC 32.5 30.0 - 36.0 g/dL   RDW 14.1 11.5 - 15.5 %   Platelets 224 150 - 400 K/uL    Comment: Performed at [facility], [place] [address] [place], [place] [address]  Hemoglobin A1c     Status: Abnormal   Collection Time: 06/13/18  3:16 AM  Result Value Ref Range   Hgb A1c MFr Bld 6.6 (H) 4.8 - 5.6 %    Comment: (NOTE) Pre diabetes:          5.7%-6.4% Diabetes:              >6.4% Glycemic control for   <7.0% adults with diabetes    Mean Plasma Glucose 142.72 mg/dL    Comment: Performed at [facility] [address] [place], [place] [address]  Glucose, capillary     Status: Abnormal   Collection Time: 06/13/18  6:16 AM  Result Value Ref Range   Glucose-Capillary 118 (H) 70 - 99 mg/dL   Ct Abdomen Pelvis Wo Contrast  Result Date: 06/13/2018 CLINICAL DATA:  Evaluate bowel obstruction. EXAM: CT ABDOMEN AND PELVIS WITHOUT CONTRAST TECHNIQUE: Multidetector CT imaging of the abdomen and pelvis was performed following the standard protocol without IV contrast. COMPARISON:  06/12/2018 FINDINGS: Lower chest: Airspace consolidation within the lingula identified. Bilateral lower lobe subsegmental atelectasis and posterior pleural thickening identified. Hepatobiliary:  Tiny stones layering within the dependent portion of the gallbladder noted. No focal liver abnormality identified. No biliary dilatation. Pancreas: Normal appearance of the pancreas. Spleen: Spleen is unremarkable. Adrenals/Urinary Tract: The adrenal glands are normal. Bilateral kidney stones are noted. The largest stone is in the inferior pole collecting system of the left kidney measuring 5 mm. No hydronephrosis. Urinary bladder appears normal. Stomach/Bowel: The stomach appears normal. The proximal small bowel loops are dilated measuring up to 4.2 cm containing air-fluid levels. Transition to decreased caliber small bowel loops noted in the left lower quadrant  of the abdomen, image 68/3. Normal caliber of the distal jejunum and ileum. Terminal ileum appears normal. No pathologic dilatation of the colon Vascular/Lymphatic: Aortic atherosclerosis. No upper abdominal adenopathy. No pelvic or inguinal adenopathy. Reproductive: Prostate is unremarkable. Other: No abdominal wall hernia or abnormality. No abdominopelvic ascites. Musculoskeletal: Lumbar degenerative disc disease. No aggressive lytic or sclerotic bone lesions. IMPRESSION: 1. Exam positive for proximal small bowel obstruction with transition point in the left lower quadrant of the abdomen. 2. No evidence for bowel perforation.  No abscess identified. 3. Lingular pneumonia with bibasilar atelectasis. 4. Gallstones. 5.  Aortic Atherosclerosis (ICD10-I70.0). Electronically Signed   By: Kerby Moors M.D.   On: 06/13/2018 07:47   Dg Abd 1 View  Result Date: 06/12/2018 CLINICAL DATA:  67 year old male with abdominal distention and vomiting. EXAM: ABDOMEN - 1 VIEW COMPARISON:  None. FINDINGS: There are two adjacent air-filled segments of the colon in the right hemiabdomen measuring up to 8.5 cm in diameter. This may represent a air distended sigmoid colon. A sigmoid volvulus is not entirely excluded. Further evaluation with CT is recommended. Air is noted  throughout the colon. There is paucity of small bowel air. No definite free air identified. No radiopaque calculi. The osseous structures and soft tissues are grossly unremarkable. IMPRESSION: 1. Constipation.  No evidence of small-bowel obstruction. 2. Air distended redundant sigmoid colon. A sigmoid volvulus is not entirely excluded. Further evaluation with CT is recommended. These results were called by telephone at the time of interpretation on 06/12/2018 at 11:03 pm to nurse Leight, who verbally acknowledged these results. Electronically Signed   By: Anner Crete M.D.   On: 06/12/2018 23:35   Assessment/Plan Right ankle fracture AKI CKD Anemia Type 2 diabetes mellitus Hyperlipidemia  PSBO -Unknown etiology, no past medical history of abdominal surgery, no known hernias, no history of abnormal colonoscopy or colon cancer - NG tube placed today 7/14, start small bowel protocol with Gastrografin - Continue n.p.o., IVF, bowel rest - Mobilize and out of bed as able - General surgery will follow   Jill Alexanders, [facility] Surgery 06/13/2018, 11:06 AM Pager: [phone] Consults: [phone] Mon-Fri 7:00 am-4:30 pm Sat-Sun 7:00 am-11:30 am

## 2018-06-14 ENCOUNTER — Encounter (HOSPITAL_COMMUNITY): Payer: Self-pay | Admitting: Student

## 2018-06-14 LAB — BASIC METABOLIC PANEL
Anion gap: 11 (ref 5–15)
BUN: 51 mg/dL — ABNORMAL HIGH (ref 8–23)
CO2: 26 mmol/L (ref 22–32)
Calcium: 8.6 mg/dL — ABNORMAL LOW (ref 8.9–10.3)
Chloride: 108 mmol/L (ref 98–111)
Creatinine, Ser: 3.96 mg/dL — ABNORMAL HIGH (ref 0.61–1.24)
GFR calc Af Amer: 17 mL/min — ABNORMAL LOW (ref >60)
GFR calc non Af Amer: 14 mL/min — ABNORMAL LOW (ref >60)
Glucose, Bld: 111 mg/dL — ABNORMAL HIGH (ref 70–99)
Potassium: 4.2 mmol/L (ref 3.5–5.1)
Sodium: 145 mmol/L (ref 135–145)

## 2018-06-14 LAB — GLUCOSE, CAPILLARY
Glucose-Capillary: 103 mg/dL — ABNORMAL HIGH (ref 70–99)
Glucose-Capillary: 128 mg/dL — ABNORMAL HIGH (ref 70–99)
Glucose-Capillary: 81 mg/dL (ref 70–99)
Glucose-Capillary: 100 mg/dL — ABNORMAL HIGH (ref 70–99)

## 2018-06-14 LAB — CBC
HCT: 30.8 % — ABNORMAL LOW (ref 39.0–52.0)
Hemoglobin: 9.9 g/dL — ABNORMAL LOW (ref 13.0–17.0)
MCH: 31.2 pg (ref 26.0–34.0)
MCHC: 32.1 g/dL (ref 30.0–36.0)
MCV: 97.2 fL (ref 78.0–100.0)
Platelets: 219 10*3/uL (ref 150–400)
RBC: 3.17 MIL/uL — ABNORMAL LOW (ref 4.22–5.81)
RDW: 13.8 % (ref 11.5–15.5)
WBC: 6.8 10*3/uL (ref 4.0–10.5)

## 2018-06-14 MED ORDER — FLEET ENEMA 7-19 GM/118ML RE ENEM: 1.0000 | ENEMA | Freq: Once | RECTAL | Status: DC

## 2018-06-14 MED ORDER — DOCUSATE SODIUM 50 MG/5ML PO LIQD
100.0000 mg | Freq: Two times a day (BID) | ORAL | Status: DC
  Administered 2018-06-14 – 2018-06-15 (×2): 100 mg via ORAL
  Filled 2018-06-14 (×4): qty 10

## 2018-06-14 NOTE — Progress Notes (Signed)
RN paged Family Medicine to get a face to face consult for the patient awaiting callback

## 2018-06-14 NOTE — Progress Notes (Signed)
Patient seen.  Tolerated full liquid diet for dinner.  Denies any new pain/complaints.    Will advance diet to soft foods in the AM.  Bernita Raisin Rittberger, DO 7:54 PM

## 2018-06-14 NOTE — Care Management Important Message (Signed)
Important Message  Patient Details  Name: [name] MRN: [id number] Date of Birth: [birth year]   Medicare Important Message Given:  Yes    [name] 06/14/2018, 3:41 PM

## 2018-06-14 NOTE — Care Management Note (Signed)
Case Management Note  Patient Details  Name: [name] MRN: [id number] Date of Birth: 04-Feb-1951  Subjective/Objective:   67 yr old gentleman s/p fall with right ankle fracture. Patient underwent ORIF of right ankle fracture, I & D and removal of external fixation.            Action/Plan: Case manager spoke with patient concerning discharge plan and DME. Choice for Home Health agency was offered, referral was called to Melene Muller, [place] Liaison. Patient says his son and other family will assist him at discharge.    Expected Discharge Date:    pending             Expected Discharge Plan:  [place]  In-House Referral:  NA  Discharge planning Services  CM Consult  Post Acute Care Choice:  Durable Medical Equipment, Home Health Choice offered to:  Patient  DME Arranged:  3-N-1, [place] rolling DME Agency:  [place]:  PT [place]:  [place]  Status of Service:  Completed, signed off  If discussed at [place] of Stay Meetings, dates discussed:    Additional Comments:  Ninfa Meeker, RN 06/14/2018, 2:37 PM

## 2018-06-14 NOTE — Progress Notes (Signed)
Patient ID: [name], male   DOB: Sep 03, 1951, 67 y.o.   MRN: [id number]    3 Days Post-Op  Subjective: Pt feels well today.  Passing flatus, but no BM.  No pain.  Objective: Vital signs in last 24 hours: Temp:  [97.8 F (36.6 C)-99.1 F (37.3 C)] 97.8 F (36.6 C) (07/15 0454) Pulse Rate:  [70-87] 70 (07/15 0454) Resp:  [16] 16 (07/15 0454) BP: (140-156)/(80-86) 140/80 (07/15 0454) SpO2:  [96 %-98 %] 98 % (07/15 0454) Last BM Date: 06/10/18  Intake/Output from previous day: 07/14 0701 - 07/15 0700 In: 140 [NG/GT:90; IV Piggyback:50] Out: 700 [Urine:450; Emesis/NG output:250] Intake/Output this shift: No intake/output data recorded.  PE: Heart: regular Lungs: CTAB Abd: soft, NT, ND, +BS, NGT with minimal clear output in tubing.  Only 250cc since yesterday.  Lab Results:  Recent Labs    06/13/18 0316 06/14/18 0336  WBC 9.9 6.8  HGB 10.4* 9.9*  HCT 32.0* 30.8*  PLT 224 219   BMET Recent Labs    06/13/18 0316 06/14/18 0336  NA 143 145  K 4.3 4.2  CL 105 108  CO2 26 26  GLUCOSE 114* 111*  BUN 49* 51*  CREATININE 3.93* 3.96*  CALCIUM 8.8* 8.6*   PT/INR No results for input(s): LABPROT, INR in the last 72 hours. CMP     Component Value Date/Time   NA 145 06/14/2018 0336   K 4.2 06/14/2018 0336   CL 108 06/14/2018 0336   CO2 26 06/14/2018 0336   GLUCOSE 111 (H) 06/14/2018 0336   BUN 51 (H) 06/14/2018 0336   CREATININE 3.96 (H) 06/14/2018 0336   CALCIUM 8.6 (L) 06/14/2018 0336   PROT 6.0 (L) 06/08/2018 1432   ALBUMIN 3.6 06/08/2018 1432   AST 21 06/08/2018 1432   ALT 18 06/08/2018 1432   ALKPHOS 54 06/08/2018 1432   BILITOT 0.6 06/08/2018 1432   GFRNONAA 14 (L) 06/14/2018 0336   GFRAA 17 (L) 06/14/2018 0336   Lipase  No results found for: LIPASE     Studies/Results: Ct Abdomen Pelvis Wo Contrast  Result Date: 06/13/2018 CLINICAL DATA:  Evaluate bowel obstruction. EXAM: CT ABDOMEN AND PELVIS WITHOUT CONTRAST TECHNIQUE: Multidetector CT  imaging of the abdomen and pelvis was performed following the standard protocol without IV contrast. COMPARISON:  06/12/2018 FINDINGS: Lower chest: Airspace consolidation within the lingula identified. Bilateral lower lobe subsegmental atelectasis and posterior pleural thickening identified. Hepatobiliary: Tiny stones layering within the dependent portion of the gallbladder noted. No focal liver abnormality identified. No biliary dilatation. Pancreas: Normal appearance of the pancreas. Spleen: Spleen is unremarkable. Adrenals/Urinary Tract: The adrenal glands are normal. Bilateral kidney stones are noted. The largest stone is in the inferior pole collecting system of the left kidney measuring 5 mm. No hydronephrosis. Urinary bladder appears normal. Stomach/Bowel: The stomach appears normal. The proximal small bowel loops are dilated measuring up to 4.2 cm containing air-fluid levels. Transition to decreased caliber small bowel loops noted in the left lower quadrant of the abdomen, image 68/3. Normal caliber of the distal jejunum and ileum. Terminal ileum appears normal. No pathologic dilatation of the colon Vascular/Lymphatic: Aortic atherosclerosis. No upper abdominal adenopathy. No pelvic or inguinal adenopathy. Reproductive: Prostate is unremarkable. Other: No abdominal wall hernia or abnormality. No abdominopelvic ascites. Musculoskeletal: Lumbar degenerative disc disease. No aggressive lytic or sclerotic bone lesions. IMPRESSION: 1. Exam positive for proximal small bowel obstruction with transition point in the left lower quadrant of the abdomen. 2. No evidence for bowel perforation.  No abscess identified. 3. Lingular pneumonia with bibasilar atelectasis. 4. Gallstones. 5.  Aortic Atherosclerosis (ICD10-I70.0). Electronically Signed   By: Kerby Moors M.D.   On: 06/13/2018 07:47   Dg Abd 1 View  Result Date: 06/13/2018 CLINICAL DATA:  Evaluate NG tube placement. EXAM: ABDOMEN - 1 VIEW COMPARISON:   06/13/2018 FINDINGS: The enteric tube tip projects over the left upper quadrant of the abdomen in the expected location of the body of stomach. The side port is below the level of the GE junction. Dilated small bowel loops are again noted compatible with bowel obstruction. A large stool burden noted within the colon. IMPRESSION: 1. Enteric tube tip projects over the left upper quadrant of the abdomen in the expected location of the body of stomach. Electronically Signed   By: Kerby Moors M.D.   On: 06/13/2018 11:47   Dg Abd 1 View  Result Date: 06/12/2018 CLINICAL DATA:  67 year old male with abdominal distention and vomiting. EXAM: ABDOMEN - 1 VIEW COMPARISON:  None. FINDINGS: There are two adjacent air-filled segments of the colon in the right hemiabdomen measuring up to 8.5 cm in diameter. This may represent a air distended sigmoid colon. A sigmoid volvulus is not entirely excluded. Further evaluation with CT is recommended. Air is noted throughout the colon. There is paucity of small bowel air. No definite free air identified. No radiopaque calculi. The osseous structures and soft tissues are grossly unremarkable. IMPRESSION: 1. Constipation.  No evidence of small-bowel obstruction. 2. Air distended redundant sigmoid colon. A sigmoid volvulus is not entirely excluded. Further evaluation with CT is recommended. These results were called by telephone at the time of interpretation on 06/12/2018 at 11:03 pm to nurse Leight, who verbally acknowledged these results. Electronically Signed   By: Anner Crete M.D.   On: 06/12/2018 23:35   Dg Abd Portable 1v-small Bowel Obstruction Protocol-initial, 8 Hr Delay  Result Date: 06/13/2018 CLINICAL DATA:  Small bowel obstruction. EXAM: PORTABLE ABDOMEN - 1 VIEW COMPARISON:  Plain film of 0955 hours.  CT of earlier today. FINDINGS: Mar 14, 2122 hours. Contrast has passed into the right side of the colon to the level of the distal transverse colon. There is a small amount  of contrast within normal caliber small bowel loops. Large amount of stool within the left side of the colon. No gross free intraperitoneal air. IMPRESSION: Passage of contrast into the proximal and mid colon, as above. Large colonic stool burden, suggesting constipation. Electronically Signed   By: Abigail Miyamoto M.D.   On: 06/13/2018 23:17    Anti-infectives: Anti-infectives (From admission, onward)   Start     Dose/Rate Route Frequency Ordered Stop   06/11/18 1600  ceFAZolin (ANCEF) IVPB 2g/100 mL premix  Status:  Discontinued     2 g 200 mL/hr over 30 Minutes Intravenous Every 8 hours 06/11/18 1109 06/11/18 1110   06/11/18 1600  ceFAZolin (ANCEF) IVPB 2g/100 mL premix     2 g 200 mL/hr over 30 Minutes Intravenous Every 8 hours 06/11/18 1110 06/12/18 0112   06/11/18 0932  vancomycin (VANCOCIN) powder  Status:  Discontinued       As needed 06/11/18 0843 06/11/18 1028   06/11/18 0932  tobramycin (NEBCIN) powder  Status:  Discontinued       As needed 06/11/18 0932 06/11/18 1028   06/09/18 1500  ceFAZolin (ANCEF) IVPB 2g/100 mL premix  Status:  Discontinued     2 g 200 mL/hr over 30 Minutes Intravenous Every 12 hours 06/08/18 03-14-2016 06/09/18  1021   06/09/18 0900  cefTRIAXone (ROCEPHIN) 2 g in sodium chloride 0.9 % 100 mL IVPB     2 g 200 mL/hr over 30 Minutes Intravenous Every 24 hours 06/09/18 0807 06/10/18 1021   06/09/18 0600  ceFAZolin (ANCEF) IVPB 2g/100 mL premix  Status:  Discontinued     2 g 200 mL/hr over 30 Minutes Intravenous On call to O.R. 06/08/18 1648 06/08/18 2014   06/08/18 1702  ceFAZolin (ANCEF) 2-4 GM/100ML-% IVPB    Note to Pharmacy:  Lisette Grinder   : cabinet override      06/08/18 1702 06/09/18 0514   06/08/18 1500  ceFAZolin (ANCEF) IVPB 2g/100 mL premix     2 g 200 mL/hr over 30 Minutes Intravenous  Once 06/08/18 1442 06/08/18 1521       Assessment/Plan Right ankle fracture AKI CKD Anemia Type 2 diabetes mellitus Hyperlipidemia  PSBO -Unknown  etiology, no past medical history of abdominal surgery, no known hernias, no history of abnormal colonoscopy or colon cancer - patient appears to have a large stool burden c/w constipation in colon.  Contrast from gastrograffin throughout colon.   -DC NGT and place on clear liquids -fleets enema today to help move stool as I suspect this is contributing to some of his issues as original CT scan shows mostly colonic dilatation as opposed to small bowel dilatation.  FEN - clears, enema VTE - Lovenox ID - None   LOS: 6 days    Henreitta Cea , [facility] 06/14/2018, 8:57 AM Pager: [phone]

## 2018-06-14 NOTE — Progress Notes (Signed)
Family Medicine Teaching Service Daily Progress Note Intern Pager: [phone]  Patient name: [name] Medical record number: [id number] Date of birth: [birth year] Age: 67 y.o. Gender: male  Primary Care Provider: Patient, No Pcp Per Consultants: Ortho, Ortho Trauma Code Status: Full Code  Pt Overview and Major Events to Date:  7/9 Admitted with open right ankle fracture, reduced under sedation in ED 7/9 Irrigation, debridement, ORIF of medical malleolus and external fixation  7/12 irrigation and debridement with removal of external fixator and hardware  Assessment and Plan: [name] is a 67 y.o. male presenting with Open right ankle fracture . PMH is significant for T2DM, chronic inflammatory demyelinating polyneuropathy, HTN, HLD, chronic acidosis, CKD (baseline creatinine ~2.5).   Partial SBO. Emesis output 2100cc overnight. Abd exam this morning improved with less distension and now passing gass. CT abd showing proximal SBO with transition point. No h/o abd surgeries per chart review. - 3.2 L NG output 7/14, passing gas - General surgery consulted, recommended NG tube for decompression. Appreciate additional recommendations - dulcolax qd prn - colace BID scheduled - miralax previously prn, now daily scheduled - IV pepcid 20mg  qd - prn reglan - remove NG tube 7/16 - Will progress diet today.  Clear liquids in the am, full liquids in pm, soft foods 7/16 am, general diet 7/16 pm.  Right Ankle Fracture S/P ORIF of right ankle and syndesmosis on 06/08/18, with subsequent surgical removal of external fixator and hardware on 06/11/18.  - Pain control per Ortho: on oxyIR 5mg  for mild pain, 10mg  for moderate pain, 15mg  for severe pain q4prn with morphine 2mg  q1prn for breakthrough. Robaxin prn for muscle spasms - Per ortho, NWB and remain in splint with outpt follow up in 1-2 weeks - PT recs: Home health PT with 24-hour supervision  AKI on chronic CKD w/ chronic acidosis, improved. Cr peak  4.6 now 3.96(7/15), baseline 2.3. Likely at new baseline. On bicarb 650 mg twice daily at home.  Continues to make urine 2.7L (7/14). -Holding metformin/lisinopril -Monitor BMP -Follow-up with nephrology within 1 month post discharge. -Avoid nephrotoxic agents -Continue home bicarb  Anemia: Acute on chronic. Hgb post surgery drifted down to 7.6, of note patient did not receive 2U on 06/10/18 so received a total of 2U RBC only on 06/12/18. Post transfusion Hgb improved to 10.4  - monitor CBC  T2DM: Chronic. On metformin twice daily at home.  - on sSSI + qhs coverage - holding metformin, will not continue as outpatient due to CKD - a1c pending  HLD: Chronic.  -Continue home simvastatin 20mg  qd  HTN: Chronic.  BP elevated overnight to 180/102 in the setting of vomiting, improved to 156/86. Takes lisinopril 2.5mg  QD at home -Holding lisinopril in the setting of AKI, restart as necessary and if creatinine allows - monitor vitals  Hypocalcemia: Stable On vitamin D 1000, home dose -Continue Tums BID  History of Chronic inflammatory demyelinating polyneuropathy with chronic foot drop:  Stable History of multiple multiple fractures in right foot and chronic foot drop. - PT rec HHPT with 24h supervision  FEN/GI: NPO  PPx: Lovenox   Disposition: Home  Subjective:  Complaints this morning.  Patient mentioned that he had vomited since the day before.  Patient subjectively feels improved.  No complaints regarding pain control.  Objective: Temp:  [97.8 F (36.6 C)-99.1 F (37.3 C)] 97.8 F (36.6 C) (07/15 0454) Pulse Rate:  [70-87] 70 (07/15 0454) Resp:  [16] 16 (07/15 0454) BP: (140-156)/(80-86) 140/80 (07/15 0454) SpO2:  [  96 %-98 %] 98 % (07/15 0454)  Physical exam: General: Alert and cooperative and appears to be in no acute distress HEENT: Neck non-tender without lymphadenopathy, masses or thyromegaly Cardio: Normal A1 and S2, no S3 or S4. Rhythm is regular. No murmurs or  rubs.   Pulm: Clear to auscultation bilaterally, no crackles, wheezing, or diminished breath sounds. Normal respiratory effort Abdomen: Bowel sounds normal. Abdomen soft and non-tender.  Extremities: No peripheral edema. Warm/ well perfused.  Strong radial pulses. Neuro: Cranial nerves grossly intact   Laboratory: Recent Labs  Lab 06/12/18 0344 06/12/18 2007-02-24 06/13/18 0316 06/14/18 0336  WBC 8.9  --  9.9 6.8  HGB 7.6* 9.7* 10.4* 9.9*  HCT 23.4* 29.3* 32.0* 30.8*  PLT 187  --  224 219   Recent Labs  Lab 06/08/18 1432  06/12/18 0344 06/13/18 0316 06/14/18 0336  NA 139   < > 136 143 145  K 4.7   < > 4.7 4.3 4.2  CL 110   < > 105 105 108  CO2 17*   < > 20* 26 26  BUN 51*   < > 48* 49* 51*  CREATININE 4.44*   < > 3.81* 3.93* 3.96*  CALCIUM 8.2*   < > 8.1* 8.8* 8.6*  PROT 6.0*  --   --   --   --   BILITOT 0.6  --   --   --   --   ALKPHOS 54  --   --   --   --   ALT 18  --   --   --   --   AST 21  --   --   --   --   GLUCOSE 111*   < > 144* 114* 111*   < > = values in this interval not displayed.     Imaging/Diagnostic Tests: Dg Abd 1 View  Result Date: 06/13/2018 CLINICAL DATA:  Evaluate NG tube placement. EXAM: ABDOMEN - 1 VIEW COMPARISON:  06/13/2018 FINDINGS: The enteric tube tip projects over the left upper quadrant of the abdomen in the expected location of the body of stomach. The side port is below the level of the GE junction. Dilated small bowel loops are again noted compatible with bowel obstruction. A large stool burden noted within the colon. IMPRESSION: 1. Enteric tube tip projects over the left upper quadrant of the abdomen in the expected location of the body of stomach. Electronically Signed   By: Kerby Moors M.D.   On: 06/13/2018 11:47   Dg Abd Portable 1v-small Bowel Obstruction Protocol-initial, 8 Hr Delay  Result Date: 06/13/2018 CLINICAL DATA:  Small bowel obstruction. EXAM: PORTABLE ABDOMEN - 1 VIEW COMPARISON:  Plain film of 0955 hours.  CT of  earlier today. FINDINGS: 2122-02-23 hours. Contrast has passed into the right side of the colon to the level of the distal transverse colon. There is a small amount of contrast within normal caliber small bowel loops. Large amount of stool within the left side of the colon. No gross free intraperitoneal air. IMPRESSION: Passage of contrast into the proximal and mid colon, as above. Large colonic stool burden, suggesting constipation. Electronically Signed   By: Abigail Miyamoto M.D.   On: 06/13/2018 23:17    Matilde Haymaker, MD 06/14/2018, 6:13 AM PGY-3, [place] Intern pager: [phone], text pages welcome

## 2018-06-14 NOTE — Progress Notes (Signed)
Physical Therapy Treatment Patient Details Name: [name] MRN: [id number] DOB: [birth year] Today's Date: 06/14/2018    History of Present Illness Pt is a 67 y/o male admitted after falling from ladder status post a type III a open ankle fracture dislocation status post external fixation and ORIF of medial malleolus. Plan for return to the operating room for repeat I&D and likely ORIF of fibula and revision fixation of his medial malleolus.  PMH significant for but not limited to: DM2, CKD, CIDP.     PT Comments    Patient is making progress toward PT goals. Pt is able to ambulate 162ft with RW and min guard assist and able to ascend/descend 2 steps with min A. Current plan remains appropriate.    Follow Up Recommendations  Home health PT;Supervision/Assistance - 24 hour     Equipment Recommendations  Rolling walker with 5" wheels;3in1 (PT)    Recommendations for Other Services       Precautions / Restrictions Precautions Precautions: Fall Other Brace/Splint: post op splint to R LE Restrictions Weight Bearing Restrictions: Yes RLE Weight Bearing: Non weight bearing    Mobility  Bed Mobility Overal bed mobility: Modified Independent Bed Mobility: Supine to Sit           General bed mobility comments: pt OOB in chair upon arrival  Transfers Overall transfer level: Needs assistance Equipment used: Rolling walker (2 wheeled) Transfers: Sit to/from Stand Sit to Stand: Min assist         General transfer comment: assist to stabilize RW and increased effort to stand  Ambulation/Gait Ambulation/Gait assistance: Min guard Gait Distance (Feet): 120 Feet Assistive device: Rolling walker (2 wheeled) Gait Pattern/deviations: Step-to pattern Gait velocity: decreased   General Gait Details: min guard for safety   Stairs Stairs: Yes Stairs assistance: Min assist Stair Management: No rails;Step to pattern;Backwards;With walker Number of Stairs: 2 General stair  comments: cues for technique; assistance to stabilize RW    Wheelchair Mobility    Modified Rankin (Stroke Patients Only)       Balance Overall balance assessment: Needs assistance Sitting-balance support: Feet unsupported;No upper extremity supported Sitting balance-Leahy Scale: Good     Standing balance support: During functional activity;Bilateral upper extremity supported Standing balance-Leahy Scale: Poor Standing balance comment: bilat UE required for gait                             Cognition Arousal/Alertness: Awake/alert Behavior During Therapy: WFL for tasks assessed/performed Overall Cognitive Status: Within Functional Limits for tasks assessed                                        Exercises      General Comments        Pertinent Vitals/Pain Pain Assessment: Faces Faces Pain Scale: Hurts a little bit Pain Location: R ankle Pain Descriptors / Indicators: Operative site guarding Pain Intervention(s): Limited activity within patient's tolerance;Monitored during session    Home Living                      Prior Function            PT Goals (current goals can now be found in the care plan section) Acute Rehab PT Goals Patient Stated Goal: return home PT Goal Formulation: With patient Time For Goal Achievement: 06/24/18 Potential to  Achieve Goals: Good Progress towards PT goals: Progressing toward goals    Frequency    Min 3X/week      PT Plan Current plan remains appropriate    Co-evaluation              AM-PAC PT "6 Clicks" Daily Activity  Outcome Measure  Difficulty turning over in bed (including adjusting bedclothes, sheets and blankets)?: A Little Difficulty moving from lying on back to sitting on the side of the bed? : A Little Difficulty sitting down on and standing up from a chair with arms (e.g., wheelchair, bedside commode, etc,.)?: A Lot Help needed moving to and from a bed to chair  (including a wheelchair)?: A Little Help needed walking in hospital room?: A Little Help needed climbing 3-5 steps with a railing? : A Lot 6 Click Score: 16    End of Session Equipment Utilized During Treatment: Gait belt Activity Tolerance: Patient tolerated treatment well Patient left: in chair;with call bell/phone within reach Nurse Communication: Mobility status PT Visit Diagnosis: Other abnormalities of gait and mobility (R26.89);Pain Pain - Right/Left: Right Pain - part of body: Ankle and joints of foot     Time: 1510-1527 PT Time Calculation (min) (ACUTE ONLY): 17 min  Charges:  $Gait Training: 8-22 mins                    G Codes:       Earney Navy, PTA Pager: [phone]     Darliss Cheney 06/14/2018, 3:40 PM

## 2018-06-14 NOTE — Progress Notes (Signed)
RN paged family medicine because PT wanted to know If he was discharging tomorrow so he could let his daughter know. Awaiting call back

## 2018-06-14 NOTE — Progress Notes (Signed)
Occupational Therapy Treatment Patient Details Name: [name] MRN: [id number] DOB: 06-07-1951 Today's Date: 06/14/2018    History of present illness Pt is a 67 y/o male admitted after falling from ladder status post a type III a open ankle fracture dislocation status post external fixation and ORIF of medial malleolus. Plan for return to the operating room for repeat I&D and likely ORIF of fibula and revision fixation of his medial malleolus.  PMH significant for but not limited to: DM2, CKD, CIDP.  Bowel obstuction 7/14 with NG tube and NPO 7/14.  NG tube removed 7/15.    OT comments  Patient progressing well. Demonstrates ability to complete toilet transfers and toileting using RW with min guard assistance for safety, limited standing balance due to NWB R LE.  Improved recall of hand placement during transfers, initially requiring cueing for pacing during mobility. Continued education on precautions, safety, and recommendations.  Plan next session for tub transfer.  Will continue to follow while admitted.    Follow Up Recommendations  No OT follow up;Supervision/Assistance - 24 hour    Equipment Recommendations  3 in 1 bedside commode    Recommendations for Other Services      Precautions / Restrictions Precautions Precautions: Fall Other Brace/Splint: post op splint to R LE Restrictions Weight Bearing Restrictions: Yes RLE Weight Bearing: Non weight bearing       Mobility Bed Mobility Overal bed mobility: Modified Independent Bed Mobility: Supine to Sit           General bed mobility comments: HOB elevated, increased time and effort  Transfers Overall transfer level: Needs assistance Equipment used: Rolling walker (2 wheeled) Transfers: Sit to/from Stand Sit to Stand: Min guard         General transfer comment: demonstrates good safety awarness and hand placement during transfers     Balance Overall balance assessment: Needs assistance Sitting-balance support:  Feet unsupported;No upper extremity supported Sitting balance-Leahy Scale: Good     Standing balance support: Single extremity supported;During functional activity Standing balance-Leahy Scale: Poor Standing balance comment: min guard for 1 hand support during toileting tasks                           ADL either performed or assessed with clinical judgement   ADL Overall ADL's : Needs assistance/impaired     Grooming: Set up;Sitting;Wash/dry hands;Wash/dry face(shaving)                   Toilet Transfer: Min guard;Ambulation;BSC;RW;Grab bars;Comfort height toilet(BSC over toilet) Toilet Transfer Details (indicate cue type and reason): patient demonstrating good safety and technique with transfers, cueing for pacing Toileting- Clothing Manipulation and Hygiene: Min guard;Sit to/from stand Toileting - Clothing Manipulation Details (indicate cue type and reason): hygiene seated with SPV, standing clothing management with minguard assist      Functional mobility during ADLs: Min guard;Cueing for safety;Rolling walker General ADL Comments: good adherance to R LE NWB precautions      Vision       Perception     Praxis      Cognition Arousal/Alertness: Awake/alert Behavior During Therapy: WFL for tasks assessed/performed Overall Cognitive Status: Within Functional Limits for tasks assessed                                          Exercises  Shoulder Instructions       General Comments      Pertinent Vitals/ Pain       Pain Assessment: Faces Faces Pain Scale: Hurts a little bit Pain Location: R ankle Pain Descriptors / Indicators: Operative site guarding Pain Intervention(s): Monitored during session  Home Living                                          Prior Functioning/Environment              Frequency  Min 2X/week        Progress Toward Goals  OT Goals(current goals can now be found in the  care plan section)  Progress towards OT goals: Progressing toward goals  Acute Rehab OT Goals Patient Stated Goal: return home OT Goal Formulation: With patient Time For Goal Achievement: 06/23/18 Potential to Achieve Goals: Good  Plan Discharge plan remains appropriate;Frequency remains appropriate    Co-evaluation                 AM-PAC PT "6 Clicks" Daily Activity     Outcome Measure   Help from another person eating meals?: None Help from another person taking care of personal grooming?: A Little Help from another person toileting, which includes using toliet, bedpan, or urinal?: A Little Help from another person bathing (including washing, rinsing, drying)?: A Little Help from another person to put on and taking off regular upper body clothing?: None Help from another person to put on and taking off regular lower body clothing?: A Little 6 Click Score: 20    End of Session Equipment Utilized During Treatment: Gait belt;Rolling walker  OT Visit Diagnosis: Other abnormalities of gait and mobility (R26.89);Pain Pain - Right/Left: Right Pain - part of body: Ankle and joints of foot   Activity Tolerance Patient tolerated treatment well   Patient Left with nursing/sitter in room(seated on 3:1 at sink finishing grooming)   Nurse Communication Mobility status;Other (comment)(+BM )        Time: 9169-4503 OT Time Calculation (min): 17 min  Charges: OT General Charges $OT Visit: 1 Visit OT Treatments $Self Care/Home Management : 8-22 mins  Delight Stare, OTR/L  Pager [place] 06/14/2018, 1:32 PM

## 2018-06-15 LAB — CBC
HCT: 29.9 % — ABNORMAL LOW (ref 39.0–52.0)
Hemoglobin: 9.5 g/dL — ABNORMAL LOW (ref 13.0–17.0)
MCH: 30.7 pg (ref 26.0–34.0)
MCHC: 31.8 g/dL (ref 30.0–36.0)
MCV: 96.8 fL (ref 78.0–100.0)
Platelets: 210 10*3/uL (ref 150–400)
RBC: 3.09 MIL/uL — ABNORMAL LOW (ref 4.22–5.81)
RDW: 13.5 % (ref 11.5–15.5)
WBC: 6.6 10*3/uL (ref 4.0–10.5)

## 2018-06-15 LAB — BASIC METABOLIC PANEL
Anion gap: 9 (ref 5–15)
BUN: 47 mg/dL — ABNORMAL HIGH (ref 8–23)
CO2: 26 mmol/L (ref 22–32)
Calcium: 8.4 mg/dL — ABNORMAL LOW (ref 8.9–10.3)
Chloride: 103 mmol/L (ref 98–111)
Creatinine, Ser: 3.61 mg/dL — ABNORMAL HIGH (ref 0.61–1.24)
GFR calc Af Amer: 19 mL/min — ABNORMAL LOW (ref >60)
GFR calc non Af Amer: 16 mL/min — ABNORMAL LOW (ref >60)
Glucose, Bld: 123 mg/dL — ABNORMAL HIGH (ref 70–99)
Potassium: 4 mmol/L (ref 3.5–5.1)
Sodium: 138 mmol/L (ref 135–145)

## 2018-06-15 LAB — GLUCOSE, CAPILLARY
Glucose-Capillary: 85 mg/dL (ref 70–99)
Glucose-Capillary: 119 mg/dL — ABNORMAL HIGH (ref 70–99)
Glucose-Capillary: 88 mg/dL (ref 70–99)

## 2018-06-15 NOTE — Progress Notes (Signed)
[facility] Progress Note  4 Days Post-Op  Subjective: CC-  No complaints. Sitting up in the chair. Abdominal pain resolved. Passing flatus and he has had 2 good BMs in the last 12 hours. Denies n/v. Tolerating fulls. Started on soft diet for lunch per primary team. Hoping to go home this afternoon.  Objective: Vital signs in last 24 hours: Temp:  [97.8 F (36.6 C)-98.8 F (37.1 C)] 98.8 F (37.1 C) (07/15 03/17/2037) Pulse Rate:  [66-72] 66 (07/15 03/17/37) Resp:  [18] 18 (07/15 17-Mar-2037) BP: (144-157)/(84-88) 157/88 (07/15 03/17/37) SpO2:  [98 %] 98 % (07/15 17-Mar-2037) Last BM Date: 06/15/18  Intake/Output from previous day: 07/15 0701 - 07/16 0700 In: 770 [P.O.:720; IV Piggyback:50] Out: 1600 [Urine:1600] Intake/Output this shift: Total I/O In: 480 [P.O.:480] Out: 750 [Urine:750]  PE: Gen:  Alert, NAD, pleasant HEENT: EOM's intact, pupils equal and round Pulm:  effort normal Abd: Soft, NT/ND, +BS, no HSM, no hernia Psych: A&Ox3  Skin: no rashes noted, warm and dry  Lab Results:  Recent Labs    06/14/18 0336 06/15/18 0407  WBC 6.8 6.6  HGB 9.9* 9.5*  HCT 30.8* 29.9*  PLT 219 210   BMET Recent Labs    06/14/18 0336 06/15/18 0407  NA 145 138  K 4.2 4.0  CL 108 103  CO2 26 26  GLUCOSE 111* 123*  BUN 51* 47*  CREATININE 3.96* 3.61*  CALCIUM 8.6* 8.4*   PT/INR No results for input(s): LABPROT, INR in the last 72 hours. CMP     Component Value Date/Time   NA 138 06/15/2018 0407   K 4.0 06/15/2018 0407   CL 103 06/15/2018 0407   CO2 26 06/15/2018 0407   GLUCOSE 123 (H) 06/15/2018 0407   BUN 47 (H) 06/15/2018 0407   CREATININE 3.61 (H) 06/15/2018 0407   CALCIUM 8.4 (L) 06/15/2018 0407   PROT 6.0 (L) 06/08/2018 1432   ALBUMIN 3.6 06/08/2018 1432   AST 21 06/08/2018 1432   ALT 18 06/08/2018 1432   ALKPHOS 54 06/08/2018 1432   BILITOT 0.6 06/08/2018 1432   GFRNONAA 16 (L) 06/15/2018 0407   GFRAA 19 (L) 06/15/2018 0407   Lipase  No results found for:  LIPASE     Studies/Results: Dg Abd Portable 1v-small Bowel Obstruction Protocol-initial, 8 Hr Delay  Result Date: 06/13/2018 CLINICAL DATA:  Small bowel obstruction. EXAM: PORTABLE ABDOMEN - 1 VIEW COMPARISON:  Plain film of 0955 hours.  CT of earlier today. FINDINGS: 03-17-2122 hours. Contrast has passed into the right side of the colon to the level of the distal transverse colon. There is a small amount of contrast within normal caliber small bowel loops. Large amount of stool within the left side of the colon. No gross free intraperitoneal air. IMPRESSION: Passage of contrast into the proximal and mid colon, as above. Large colonic stool burden, suggesting constipation. Electronically Signed   By: Abigail Miyamoto M.D.   On: 06/13/2018 23:17    Anti-infectives: Anti-infectives (From admission, onward)   Start     Dose/Rate Route Frequency Ordered Stop   06/11/18 1600  ceFAZolin (ANCEF) IVPB 2g/100 mL premix  Status:  Discontinued     2 g 200 mL/hr over 30 Minutes Intravenous Every 8 hours 06/11/18 1109 06/11/18 1110   06/11/18 1600  ceFAZolin (ANCEF) IVPB 2g/100 mL premix     2 g 200 mL/hr over 30 Minutes Intravenous Every 8 hours 06/11/18 1110 06/12/18 0112   06/11/18 0932  vancomycin (VANCOCIN) powder  Status:  Discontinued       As needed 06/11/18 0843 06/11/18 1028   06/11/18 0932  tobramycin (NEBCIN) powder  Status:  Discontinued       As needed 06/11/18 0932 06/11/18 1028   06/09/18 1500  ceFAZolin (ANCEF) IVPB 2g/100 mL premix  Status:  Discontinued     2 g 200 mL/hr over 30 Minutes Intravenous Every 12 hours 06/08/18 2017 06/09/18 0807   06/09/18 0900  cefTRIAXone (ROCEPHIN) 2 g in sodium chloride 0.9 % 100 mL IVPB     2 g 200 mL/hr over 30 Minutes Intravenous Every 24 hours 06/09/18 0807 06/10/18 1021   06/09/18 0600  ceFAZolin (ANCEF) IVPB 2g/100 mL premix  Status:  Discontinued     2 g 200 mL/hr over 30 Minutes Intravenous On call to O.R. 06/08/18 1648 06/08/18 2014   06/08/18  1702  ceFAZolin (ANCEF) 2-4 GM/100ML-% IVPB    Note to Pharmacy:  Lisette Grinder   : cabinet override      06/08/18 1702 06/09/18 0514   06/08/18 1500  ceFAZolin (ANCEF) IVPB 2g/100 mL premix     2 g 200 mL/hr over 30 Minutes Intravenous  Once 06/08/18 1442 06/08/18 1521       Assessment/Plan Right ankle fracture AKI CKD Anemia Type 2 diabetes mellitus Hyperlipidemia  PSBO Constipation -Unknown etiology, no past medical history of abdominal surgery, no known hernias, no history of abnormal colonoscopy or colon cancer -Contrast from gastrograffin throughout colon 7/14  FEN - soft diet VTE - Lovenox ID - None  Plan: Abdominal pain resolved, tolerating fulls and having bowel function. On soft diet for lunch, if he tolerates this ok to discharge this afternoon from our standpoint. General surgery will sign off, please call with concerns.   LOS: 7 days    Wellington Hampshire , [facility] 06/15/2018, 11:49 AM Pager: [phone] Consults: [phone] Mon 7:00 am -11:30 AM Tues-Fri 7:00 am-4:30 pm Sat-Sun 7:00 am-11:30 am

## 2018-06-15 NOTE — Progress Notes (Signed)
Family Medicine Teaching Service Daily Progress Note Intern Pager: [phone]  Patient name: [name] Medical record number: [id number] Date of birth: 1951/11/08 Age: 67 y.o. Gender: male  Primary Care Provider: Patient, No Pcp Per Consultants: Ortho, Ortho Trauma Code Status: Full Code  Pt Overview and Major Events [name] Date:  7/9 Admitted with open right ankle fracture, reduced under sedation in ED 7/9 Irrigation, debridement, ORIF of medical malleolus and external fixation  7/12 irrigation and debridement with removal of external fixator and hardware  Assessment and Plan: [name] is a 67 y.o. male presenting with Open right ankle fracture . PMH is significant for T2DM, chronic inflammatory demyelinating polyneuropathy, HTN, HLD, chronic acidosis, CKD (baseline creatinine ~2.5).   Partial SBO. Emesis output 2100cc overnight. Abd exam this morning improved with less distension and now passing gass. CT abd showing proximal SBO with transition point. No h/o abd surgeries per chart review. - General surgery consulted, recommended NG tube for decompression. Appreciate additional recommendations - dulcolax qd prn - colace BID scheduled - miralax previously prn, now daily scheduled - IV pepcid 20mg  qd - prn reglan - NG out 7/15, Bm 7/15, passing gas 7/15, started on clear fluids 7/15 and progressed [name] thick fluids. - Soft diet this am, progress [name] general diet in pm (7/16)  Right Ankle Fracture S/P ORIF of right ankle and syndesmosis on 06/08/18, with subsequent surgical removal of external fixator and hardware on 06/11/18.  - Pain control per Ortho: on oxyIR 5mg  for mild pain, 10mg  for moderate pain, 15mg  for severe pain q4prn with morphine 2mg  q1prn for breakthrough. Robaxin prn for muscle spasms - Per ortho, NWB and remain in splint with outpt follow up in 1-2 weeks - PT recs: Home health PT with 24-hour supervision  AKI on chronic CKD w/ chronic acidosis, improved. Cr peak 4.6 now  3.61(7/16), baseline 2.3. Likely at new baseline. On bicarb 650 mg twice daily at home.  Continues [name] make urine 1.6L (7/15). -Holding metformin/lisinopril -Monitor BMP -Follow-up with nephrology within 1 month post discharge. -Avoid nephrotoxic agents -Continue home bicarb  Anemia: Acute on chronic. Hgb post surgery drifted down [name] 7.6, of note patient did not receive 2U on 06/10/18 so received a total of 2U RBC only on 06/12/18. Post transfusion Hgb improved [name] 10.4  - monitor CBC  T2DM: Chronic. On metformin twice daily at home.  - on sSSI + qhs coverage - holding metformin, will not continue as outpatient due [name] CKD - a1c pending  HLD: Chronic.  -Continue home simvastatin 20mg  qd  HTN: Chronic.  BP elevated overnight [name] 180/102 in the setting of vomiting, improved [name] 156/86. Takes lisinopril 2.5mg  QD at home -Holding lisinopril in the setting of AKI, restart as necessary and if creatinine allows - monitor vitals  Hypocalcemia: Stable On vitamin D 1000, home dose -Continue Tums BID  History of Chronic inflammatory demyelinating polyneuropathy with chronic foot drop:  Stable History of multiple multiple fractures in right foot and chronic foot drop. - PT rec HHPT with 24h supervision  FEN/GI: NPO  PPx: Lovenox   Disposition: Home  Subjective:  [name] was seen this morning was experiencing no new symptoms or complaints.  He has had no issues with nausea or vomiting since he was taken off n.p.o.  She reported 2 bowel movements since yesterday evening.  Objective: Temp:  [97.8 F (36.6 C)-98.8 F (37.1 C)] 98.8 F (37.1 C) (07/15 2038) Pulse Rate:  [66-72] 66 (07/15 2038) Resp:  [18] 18 (  07/15 2038) BP: (144-157)/(84-88) 157/88 (07/15 2038) SpO2:  [98 %] 98 % (07/15 2038)  Physical exam: General: Alert and cooperative and appears [name] be in no acute distress HEENT: Neck non-tender without lymphadenopathy, masses or thyromegaly Cardio: Normal A1 and S2, no S3 or S4.  Rhythm is regular. No murmurs or rubs.   Pulm: Clear [name] auscultation bilaterally, no crackles, wheezing, or diminished breath sounds. Normal respiratory effort Abdomen: Bowel sounds normal. Abdomen soft and non-tender.  Extremities: No peripheral edema. Warm/ well perfused.  Strong radial pulses. Neuro: Cranial nerves grossly intact  Laboratory: Recent Labs  Lab 06/13/18 0316 06/14/18 0336 06/15/18 0407  WBC 9.9 6.8 6.6  HGB 10.4* 9.9* 9.5*  HCT 32.0* 30.8* 29.9*  PLT 224 219 210   Recent Labs  Lab 06/08/18 1432  06/13/18 0316 06/14/18 0336 06/15/18 0407  NA 139   < > 143 145 138  K 4.7   < > 4.3 4.2 4.0  CL 110   < > 105 108 103  CO2 17*   < > 26 26 26   BUN 51*   < > 49* 51* 47*  CREATININE 4.44*   < > 3.93* 3.96* 3.61*  CALCIUM 8.2*   < > 8.8* 8.6* 8.4*  PROT 6.0*  --   --   --   --   BILITOT 0.6  --   --   --   --   ALKPHOS 54  --   --   --   --   ALT 18  --   --   --   --   AST 21  --   --   --   --   GLUCOSE 111*   < > 114* 111* 123*   < > = values in this interval not displayed.     Imaging/Diagnostic Tests: No results found.  Matilde Haymaker, MD 06/15/2018, 6:01 AM PGY-3, [facility] Intern pager: [phone], text pages welcome

## 2018-06-15 NOTE — Progress Notes (Signed)
Occupational Therapy Treatment Patient Details Name: [name] MRN: [id number] DOB: 1950/12/10 Today's Date: 06/15/2018    History of present illness Pt is a 67 y/o male admitted after falling from ladder status post a type III a open ankle fracture dislocation status post external fixation and ORIF of medial malleolus. Return to the operating room for repeat I&D and ORIF of fibula and revision fixation of his medial malleolus 7/12.  Small bowel obstruction 7/13, NG tube placed and removed 7/14. PMH significant for but not limited to: DM2, CKD, CIDP.    OT comments  Patient progressing well.  Continues to require cueing for hand placement to/from seated surface during transfers, but physically requires only min guard assist for safety and balance.  Patient demonstrating good safety awareness with tub transfers using TTB; reports plan to purchase at discharge; issued education handout. Patient reports increased activity tolerance, voicing "I must be getting stronger, I'm not getting as tired".  Will continue to follow while admitted, recommendations below.    Follow Up Recommendations  No OT follow up;Supervision/Assistance - 24 hour    Equipment Recommendations  3 in 1 bedside commode;Tub/shower bench    Recommendations for Other Services      Precautions / Restrictions Precautions Precautions: Fall Required Braces or Orthoses: Other Brace/Splint Other Brace/Splint: post op splint to R LE Restrictions Weight Bearing Restrictions: Yes RLE Weight Bearing: Non weight bearing       Mobility Bed Mobility               General bed mobility comments: seated in recliner   Transfers Overall transfer level: Needs assistance Equipment used: Rolling walker (2 wheeled) Transfers: Sit to/from Stand Sit to Stand: Min guard         General transfer comment: verbal cueing for hand placement to/from seated surface    Balance Overall balance assessment: Needs  assistance Sitting-balance support: Feet unsupported;No upper extremity supported Sitting balance-Leahy Scale: Good     Standing balance support: During functional activity;Bilateral upper extremity supported Standing balance-Leahy Scale: Poor Standing balance comment: reliant on B UE support                           ADL either performed or assessed with clinical judgement   ADL Overall ADL's : Needs assistance/impaired             Lower Body Bathing: Supervison/ safety;Sitting/lateral leans Lower Body Bathing Details (indicate cue type and reason): reviewed safety bathing seated and waterproofing R LE          Toilet Transfer: Min IT trainer Details (indicate cue type and reason): cueing for hand placement, reaching towards seated surface prior to sitting (simulated to recliner )     Tub/ Shower Transfer: Tub transfer;Min guard;Ambulation;Tub bench;Cueing for sequencing;Cueing for safety Tub/Shower Transfer Details (indicate cue type and reason): simulated tub transfer using tub transfer bench, cueing for safety and technique  Functional mobility during ADLs: Min guard;Cueing for safety;Rolling walker General ADL Comments: good adherance to R LE NWB precautions, good pacing and safety awareness; patient reporting increased tolerance      Vision       Perception     Praxis      Cognition Arousal/Alertness: Awake/alert Behavior During Therapy: WFL for tasks assessed/performed Overall Cognitive Status: Within Functional Limits for tasks assessed  Exercises     Shoulder Instructions       General Comments      Pertinent Vitals/ Pain       Pain Assessment: Faces Faces Pain Scale: Hurts a little bit Pain Location: R ankle Pain Descriptors / Indicators: Operative site guarding Pain Intervention(s): Monitored during session;Repositioned  Home Living                                           Prior Functioning/Environment              Frequency  Min 2X/week        Progress Toward Goals  OT Goals(current goals can now be found in the care plan section)  Progress towards OT goals: Progressing toward goals  Acute Rehab OT Goals Patient Stated Goal: home today OT Goal Formulation: With patient Time For Goal Achievement: 06/23/18 Potential to Achieve Goals: Good  Plan Discharge plan remains appropriate;Frequency remains appropriate    Co-evaluation                 AM-PAC PT "6 Clicks" Daily Activity     Outcome Measure   Help from another person eating meals?: None Help from another person taking care of personal grooming?: A Little Help from another person toileting, which includes using toliet, bedpan, or urinal?: A Little Help from another person bathing (including washing, rinsing, drying)?: A Little Help from another person to put on and taking off regular upper body clothing?: None Help from another person to put on and taking off regular lower body clothing?: A Little 6 Click Score: 20    End of Session Equipment Utilized During Treatment: Gait belt;Rolling walker  OT Visit Diagnosis: Other abnormalities of gait and mobility (R26.89);Pain Pain - Right/Left: Right Pain - part of body: Ankle and joints of foot   Activity Tolerance Patient tolerated treatment well   Patient Left in chair;with call bell/phone within reach   Nurse Communication          Time: 3716-9678 OT Time Calculation (min): 13 min  Charges: OT General Charges $OT Visit: 1 Visit OT Treatments $Self Care/Home Management : 8-22 mins  Delight Stare, OTR/L  Pager [place] 06/15/2018, 2:53 PM

## 2018-06-15 NOTE — Progress Notes (Addendum)
AVS given and reviewed with pt. All questions answered to satisfaction. Equipment at bedside. Pt to be escorted off the unit via wheelchair by staff member. Pt awaiting family for transportation.

## 2018-06-16 ENCOUNTER — Encounter: Payer: Self-pay | Admitting: *Deleted

## 2018-06-18 DIAGNOSIS — E1122 Type 2 diabetes mellitus with diabetic chronic kidney disease: Secondary | ICD-10-CM | POA: Diagnosis not present

## 2018-06-18 DIAGNOSIS — W11XXXD Fall on and from ladder, subsequent encounter: Secondary | ICD-10-CM | POA: Diagnosis not present

## 2018-06-18 DIAGNOSIS — D509 Iron deficiency anemia, unspecified: Secondary | ICD-10-CM | POA: Diagnosis not present

## 2018-06-18 DIAGNOSIS — S82451E Displaced comminuted fracture of shaft of right fibula, subsequent encounter for open fracture type I or II with routine healing: Secondary | ICD-10-CM | POA: Diagnosis not present

## 2018-06-18 DIAGNOSIS — E785 Hyperlipidemia, unspecified: Secondary | ICD-10-CM | POA: Diagnosis not present

## 2018-06-18 DIAGNOSIS — I129 Hypertensive chronic kidney disease with stage 1 through stage 4 chronic kidney disease, or unspecified chronic kidney disease: Secondary | ICD-10-CM | POA: Diagnosis not present

## 2018-06-18 DIAGNOSIS — G6181 Chronic inflammatory demyelinating polyneuritis: Secondary | ICD-10-CM | POA: Diagnosis not present

## 2018-06-18 DIAGNOSIS — N183 Chronic kidney disease, stage 3 (moderate): Secondary | ICD-10-CM | POA: Diagnosis not present

## 2018-06-18 DIAGNOSIS — M21371 Foot drop, right foot: Secondary | ICD-10-CM | POA: Diagnosis not present

## 2018-06-18 DIAGNOSIS — Z7984 Long term (current) use of oral hypoglycemic drugs: Secondary | ICD-10-CM | POA: Diagnosis not present

## 2018-06-18 DIAGNOSIS — S8251XF Displaced fracture of medial malleolus of right tibia, subsequent encounter for open fracture type IIIA, IIIB, or IIIC with routine healing: Secondary | ICD-10-CM | POA: Diagnosis not present

## 2018-06-18 DIAGNOSIS — S93431D Sprain of tibiofibular ligament of right ankle, subsequent encounter: Secondary | ICD-10-CM | POA: Diagnosis not present

## 2018-06-18 DIAGNOSIS — K56609 Unspecified intestinal obstruction, unspecified as to partial versus complete obstruction: Secondary | ICD-10-CM | POA: Diagnosis not present

## 2018-06-21 DIAGNOSIS — S8251XF Displaced fracture of medial malleolus of right tibia, subsequent encounter for open fracture type IIIA, IIIB, or IIIC with routine healing: Secondary | ICD-10-CM | POA: Diagnosis not present

## 2018-06-23 DIAGNOSIS — G6181 Chronic inflammatory demyelinating polyneuritis: Secondary | ICD-10-CM | POA: Diagnosis not present

## 2018-06-23 DIAGNOSIS — M21371 Foot drop, right foot: Secondary | ICD-10-CM | POA: Diagnosis not present

## 2018-06-23 DIAGNOSIS — S8251XF Displaced fracture of medial malleolus of right tibia, subsequent encounter for open fracture type IIIA, IIIB, or IIIC with routine healing: Secondary | ICD-10-CM | POA: Diagnosis not present

## 2018-06-23 DIAGNOSIS — E1122 Type 2 diabetes mellitus with diabetic chronic kidney disease: Secondary | ICD-10-CM | POA: Diagnosis not present

## 2018-06-23 DIAGNOSIS — S93431D Sprain of tibiofibular ligament of right ankle, subsequent encounter: Secondary | ICD-10-CM | POA: Diagnosis not present

## 2018-06-23 DIAGNOSIS — S82451E Displaced comminuted fracture of shaft of right fibula, subsequent encounter for open fracture type I or II with routine healing: Secondary | ICD-10-CM | POA: Diagnosis not present

## 2018-06-25 DIAGNOSIS — M21371 Foot drop, right foot: Secondary | ICD-10-CM | POA: Diagnosis not present

## 2018-06-25 DIAGNOSIS — S82451E Displaced comminuted fracture of shaft of right fibula, subsequent encounter for open fracture type I or II with routine healing: Secondary | ICD-10-CM | POA: Diagnosis not present

## 2018-06-25 DIAGNOSIS — S8251XF Displaced fracture of medial malleolus of right tibia, subsequent encounter for open fracture type IIIA, IIIB, or IIIC with routine healing: Secondary | ICD-10-CM | POA: Diagnosis not present

## 2018-06-25 DIAGNOSIS — S93431D Sprain of tibiofibular ligament of right ankle, subsequent encounter: Secondary | ICD-10-CM | POA: Diagnosis not present

## 2018-06-25 DIAGNOSIS — G6181 Chronic inflammatory demyelinating polyneuritis: Secondary | ICD-10-CM | POA: Diagnosis not present

## 2018-06-25 DIAGNOSIS — E1122 Type 2 diabetes mellitus with diabetic chronic kidney disease: Secondary | ICD-10-CM | POA: Diagnosis not present

## 2018-06-29 DIAGNOSIS — S93431D Sprain of tibiofibular ligament of right ankle, subsequent encounter: Secondary | ICD-10-CM | POA: Diagnosis not present

## 2018-06-29 DIAGNOSIS — S82451E Displaced comminuted fracture of shaft of right fibula, subsequent encounter for open fracture type I or II with routine healing: Secondary | ICD-10-CM | POA: Diagnosis not present

## 2018-06-29 DIAGNOSIS — E1122 Type 2 diabetes mellitus with diabetic chronic kidney disease: Secondary | ICD-10-CM | POA: Diagnosis not present

## 2018-06-29 DIAGNOSIS — G6181 Chronic inflammatory demyelinating polyneuritis: Secondary | ICD-10-CM | POA: Diagnosis not present

## 2018-06-29 DIAGNOSIS — M21371 Foot drop, right foot: Secondary | ICD-10-CM | POA: Diagnosis not present

## 2018-06-29 DIAGNOSIS — S8251XF Displaced fracture of medial malleolus of right tibia, subsequent encounter for open fracture type IIIA, IIIB, or IIIC with routine healing: Secondary | ICD-10-CM | POA: Diagnosis not present

## 2018-07-01 DIAGNOSIS — G6181 Chronic inflammatory demyelinating polyneuritis: Secondary | ICD-10-CM | POA: Diagnosis not present

## 2018-07-01 DIAGNOSIS — E1122 Type 2 diabetes mellitus with diabetic chronic kidney disease: Secondary | ICD-10-CM | POA: Diagnosis not present

## 2018-07-01 DIAGNOSIS — S93431D Sprain of tibiofibular ligament of right ankle, subsequent encounter: Secondary | ICD-10-CM | POA: Diagnosis not present

## 2018-07-01 DIAGNOSIS — S82451E Displaced comminuted fracture of shaft of right fibula, subsequent encounter for open fracture type I or II with routine healing: Secondary | ICD-10-CM | POA: Diagnosis not present

## 2018-07-01 DIAGNOSIS — M21371 Foot drop, right foot: Secondary | ICD-10-CM | POA: Diagnosis not present

## 2018-07-01 DIAGNOSIS — S8251XF Displaced fracture of medial malleolus of right tibia, subsequent encounter for open fracture type IIIA, IIIB, or IIIC with routine healing: Secondary | ICD-10-CM | POA: Diagnosis not present

## 2018-07-05 DIAGNOSIS — I10 Essential (primary) hypertension: Secondary | ICD-10-CM | POA: Diagnosis not present

## 2018-07-05 DIAGNOSIS — E1165 Type 2 diabetes mellitus with hyperglycemia: Secondary | ICD-10-CM | POA: Diagnosis not present

## 2018-07-05 DIAGNOSIS — E7849 Other hyperlipidemia: Secondary | ICD-10-CM | POA: Diagnosis not present

## 2018-07-05 DIAGNOSIS — N182 Chronic kidney disease, stage 2 (mild): Secondary | ICD-10-CM | POA: Diagnosis not present

## 2018-07-06 DIAGNOSIS — S8251XF Displaced fracture of medial malleolus of right tibia, subsequent encounter for open fracture type IIIA, IIIB, or IIIC with routine healing: Secondary | ICD-10-CM | POA: Diagnosis not present

## 2018-07-06 DIAGNOSIS — S82451E Displaced comminuted fracture of shaft of right fibula, subsequent encounter for open fracture type I or II with routine healing: Secondary | ICD-10-CM | POA: Diagnosis not present

## 2018-07-06 DIAGNOSIS — G6181 Chronic inflammatory demyelinating polyneuritis: Secondary | ICD-10-CM | POA: Diagnosis not present

## 2018-07-06 DIAGNOSIS — M21371 Foot drop, right foot: Secondary | ICD-10-CM | POA: Diagnosis not present

## 2018-07-06 DIAGNOSIS — E1122 Type 2 diabetes mellitus with diabetic chronic kidney disease: Secondary | ICD-10-CM | POA: Diagnosis not present

## 2018-07-06 DIAGNOSIS — S93431D Sprain of tibiofibular ligament of right ankle, subsequent encounter: Secondary | ICD-10-CM | POA: Diagnosis not present

## 2018-07-08 DIAGNOSIS — G6181 Chronic inflammatory demyelinating polyneuritis: Secondary | ICD-10-CM | POA: Diagnosis not present

## 2018-07-08 DIAGNOSIS — M21371 Foot drop, right foot: Secondary | ICD-10-CM | POA: Diagnosis not present

## 2018-07-08 DIAGNOSIS — S82451E Displaced comminuted fracture of shaft of right fibula, subsequent encounter for open fracture type I or II with routine healing: Secondary | ICD-10-CM | POA: Diagnosis not present

## 2018-07-08 DIAGNOSIS — S93431D Sprain of tibiofibular ligament of right ankle, subsequent encounter: Secondary | ICD-10-CM | POA: Diagnosis not present

## 2018-07-08 DIAGNOSIS — E1122 Type 2 diabetes mellitus with diabetic chronic kidney disease: Secondary | ICD-10-CM | POA: Diagnosis not present

## 2018-07-08 DIAGNOSIS — S8251XF Displaced fracture of medial malleolus of right tibia, subsequent encounter for open fracture type IIIA, IIIB, or IIIC with routine healing: Secondary | ICD-10-CM | POA: Diagnosis not present

## 2018-07-19 DIAGNOSIS — S93431D Sprain of tibiofibular ligament of right ankle, subsequent encounter: Secondary | ICD-10-CM | POA: Diagnosis not present

## 2018-07-19 DIAGNOSIS — S8251XF Displaced fracture of medial malleolus of right tibia, subsequent encounter for open fracture type IIIA, IIIB, or IIIC with routine healing: Secondary | ICD-10-CM | POA: Diagnosis not present

## 2018-08-10 DIAGNOSIS — D649 Anemia, unspecified: Secondary | ICD-10-CM | POA: Diagnosis not present

## 2018-08-10 DIAGNOSIS — I129 Hypertensive chronic kidney disease with stage 1 through stage 4 chronic kidney disease, or unspecified chronic kidney disease: Secondary | ICD-10-CM | POA: Diagnosis present

## 2018-08-10 DIAGNOSIS — N170 Acute kidney failure with tubular necrosis: Secondary | ICD-10-CM | POA: Diagnosis present

## 2018-08-10 DIAGNOSIS — E1122 Type 2 diabetes mellitus with diabetic chronic kidney disease: Secondary | ICD-10-CM | POA: Diagnosis present

## 2018-08-10 DIAGNOSIS — E559 Vitamin D deficiency, unspecified: Secondary | ICD-10-CM | POA: Diagnosis present

## 2018-08-10 DIAGNOSIS — R42 Dizziness and giddiness: Secondary | ICD-10-CM | POA: Diagnosis not present

## 2018-08-10 DIAGNOSIS — N184 Chronic kidney disease, stage 4 (severe): Secondary | ICD-10-CM | POA: Diagnosis present

## 2018-08-10 DIAGNOSIS — D631 Anemia in chronic kidney disease: Secondary | ICD-10-CM | POA: Diagnosis present

## 2018-08-10 DIAGNOSIS — N179 Acute kidney failure, unspecified: Secondary | ICD-10-CM | POA: Diagnosis not present

## 2018-08-10 DIAGNOSIS — N19 Unspecified kidney failure: Secondary | ICD-10-CM | POA: Diagnosis not present

## 2018-08-10 DIAGNOSIS — R55 Syncope and collapse: Secondary | ICD-10-CM | POA: Diagnosis not present

## 2018-08-10 DIAGNOSIS — N185 Chronic kidney disease, stage 5: Secondary | ICD-10-CM | POA: Diagnosis not present

## 2018-08-10 DIAGNOSIS — M2550 Pain in unspecified joint: Secondary | ICD-10-CM | POA: Diagnosis not present

## 2018-08-10 DIAGNOSIS — I10 Essential (primary) hypertension: Secondary | ICD-10-CM | POA: Diagnosis not present

## 2018-08-10 DIAGNOSIS — E860 Dehydration: Secondary | ICD-10-CM | POA: Diagnosis not present

## 2018-08-10 DIAGNOSIS — E872 Acidosis: Secondary | ICD-10-CM | POA: Diagnosis not present

## 2018-08-10 DIAGNOSIS — N183 Chronic kidney disease, stage 3 (moderate): Secondary | ICD-10-CM | POA: Diagnosis not present

## 2018-08-16 DIAGNOSIS — N182 Chronic kidney disease, stage 2 (mild): Secondary | ICD-10-CM | POA: Diagnosis not present

## 2018-08-16 DIAGNOSIS — S8251XF Displaced fracture of medial malleolus of right tibia, subsequent encounter for open fracture type IIIA, IIIB, or IIIC with routine healing: Secondary | ICD-10-CM | POA: Diagnosis not present

## 2018-08-16 DIAGNOSIS — I10 Essential (primary) hypertension: Secondary | ICD-10-CM | POA: Diagnosis not present

## 2018-08-16 DIAGNOSIS — S93431D Sprain of tibiofibular ligament of right ankle, subsequent encounter: Secondary | ICD-10-CM | POA: Diagnosis not present

## 2018-08-16 DIAGNOSIS — E7849 Other hyperlipidemia: Secondary | ICD-10-CM | POA: Diagnosis not present

## 2018-08-16 DIAGNOSIS — E1165 Type 2 diabetes mellitus with hyperglycemia: Secondary | ICD-10-CM | POA: Diagnosis not present

## 2018-08-16 DIAGNOSIS — N183 Chronic kidney disease, stage 3 (moderate): Secondary | ICD-10-CM | POA: Diagnosis not present

## 2018-08-23 DIAGNOSIS — N183 Chronic kidney disease, stage 3 (moderate): Secondary | ICD-10-CM | POA: Diagnosis not present

## 2018-09-06 DIAGNOSIS — N183 Chronic kidney disease, stage 3 (moderate): Secondary | ICD-10-CM | POA: Diagnosis not present

## 2018-09-13 DIAGNOSIS — S93431D Sprain of tibiofibular ligament of right ankle, subsequent encounter: Secondary | ICD-10-CM | POA: Diagnosis not present

## 2018-09-20 DIAGNOSIS — N183 Chronic kidney disease, stage 3 (moderate): Secondary | ICD-10-CM | POA: Diagnosis not present

## 2018-10-05 DIAGNOSIS — Z0000 Encounter for general adult medical examination without abnormal findings: Secondary | ICD-10-CM | POA: Diagnosis not present

## 2018-10-05 DIAGNOSIS — E119 Type 2 diabetes mellitus without complications: Secondary | ICD-10-CM | POA: Diagnosis not present

## 2018-10-05 DIAGNOSIS — Z23 Encounter for immunization: Secondary | ICD-10-CM | POA: Diagnosis not present

## 2018-10-05 DIAGNOSIS — Z1389 Encounter for screening for other disorder: Secondary | ICD-10-CM | POA: Diagnosis not present

## 2018-10-05 DIAGNOSIS — I10 Essential (primary) hypertension: Secondary | ICD-10-CM | POA: Diagnosis not present

## 2018-10-05 DIAGNOSIS — N183 Chronic kidney disease, stage 3 (moderate): Secondary | ICD-10-CM | POA: Diagnosis not present

## 2018-10-05 DIAGNOSIS — N184 Chronic kidney disease, stage 4 (severe): Secondary | ICD-10-CM | POA: Diagnosis not present

## 2018-10-25 DIAGNOSIS — N184 Chronic kidney disease, stage 4 (severe): Secondary | ICD-10-CM | POA: Diagnosis not present

## 2018-11-08 DIAGNOSIS — M8588 Other specified disorders of bone density and structure, other site: Secondary | ICD-10-CM | POA: Diagnosis not present

## 2018-11-08 DIAGNOSIS — M810 Age-related osteoporosis without current pathological fracture: Secondary | ICD-10-CM | POA: Diagnosis not present

## 2018-11-11 DIAGNOSIS — Z79899 Other long term (current) drug therapy: Secondary | ICD-10-CM | POA: Diagnosis not present

## 2018-11-11 DIAGNOSIS — N200 Calculus of kidney: Secondary | ICD-10-CM | POA: Diagnosis not present

## 2018-11-11 DIAGNOSIS — R809 Proteinuria, unspecified: Secondary | ICD-10-CM | POA: Diagnosis not present

## 2018-11-11 DIAGNOSIS — N184 Chronic kidney disease, stage 4 (severe): Secondary | ICD-10-CM | POA: Diagnosis not present

## 2018-11-11 DIAGNOSIS — I129 Hypertensive chronic kidney disease with stage 1 through stage 4 chronic kidney disease, or unspecified chronic kidney disease: Secondary | ICD-10-CM | POA: Diagnosis not present

## 2018-11-11 DIAGNOSIS — Z1159 Encounter for screening for other viral diseases: Secondary | ICD-10-CM | POA: Diagnosis not present

## 2018-11-11 DIAGNOSIS — E559 Vitamin D deficiency, unspecified: Secondary | ICD-10-CM | POA: Diagnosis not present

## 2018-11-17 DIAGNOSIS — S93431D Sprain of tibiofibular ligament of right ankle, subsequent encounter: Secondary | ICD-10-CM | POA: Diagnosis not present

## 2018-11-17 DIAGNOSIS — S8251XF Displaced fracture of medial malleolus of right tibia, subsequent encounter for open fracture type IIIA, IIIB, or IIIC with routine healing: Secondary | ICD-10-CM | POA: Diagnosis not present

## 2018-11-29 DIAGNOSIS — E119 Type 2 diabetes mellitus without complications: Secondary | ICD-10-CM | POA: Diagnosis not present

## 2018-11-29 DIAGNOSIS — H25813 Combined forms of age-related cataract, bilateral: Secondary | ICD-10-CM | POA: Diagnosis not present

## 2018-11-29 DIAGNOSIS — H5203 Hypermetropia, bilateral: Secondary | ICD-10-CM | POA: Diagnosis not present

## 2018-11-29 DIAGNOSIS — Z7984 Long term (current) use of oral hypoglycemic drugs: Secondary | ICD-10-CM | POA: Diagnosis not present

## 2018-12-14 DIAGNOSIS — E859 Amyloidosis, unspecified: Secondary | ICD-10-CM | POA: Diagnosis not present

## 2018-12-14 DIAGNOSIS — I130 Hypertensive heart and chronic kidney disease with heart failure and stage 1 through stage 4 chronic kidney disease, or unspecified chronic kidney disease: Secondary | ICD-10-CM | POA: Diagnosis not present

## 2018-12-14 DIAGNOSIS — N185 Chronic kidney disease, stage 5: Secondary | ICD-10-CM | POA: Diagnosis not present

## 2018-12-20 DIAGNOSIS — C9000 Multiple myeloma not having achieved remission: Secondary | ICD-10-CM | POA: Diagnosis not present

## 2018-12-24 DIAGNOSIS — C9000 Multiple myeloma not having achieved remission: Secondary | ICD-10-CM | POA: Diagnosis not present

## 2018-12-24 DIAGNOSIS — M7989 Other specified soft tissue disorders: Secondary | ICD-10-CM | POA: Diagnosis not present

## 2019-01-03 DIAGNOSIS — E859 Amyloidosis, unspecified: Secondary | ICD-10-CM | POA: Diagnosis not present

## 2019-01-04 DIAGNOSIS — N185 Chronic kidney disease, stage 5: Secondary | ICD-10-CM | POA: Diagnosis not present

## 2019-01-04 DIAGNOSIS — I10 Essential (primary) hypertension: Secondary | ICD-10-CM | POA: Diagnosis not present

## 2019-01-04 DIAGNOSIS — D638 Anemia in other chronic diseases classified elsewhere: Secondary | ICD-10-CM | POA: Diagnosis not present

## 2019-01-04 DIAGNOSIS — D890 Polyclonal hypergammaglobulinemia: Secondary | ICD-10-CM | POA: Diagnosis not present

## 2019-01-04 DIAGNOSIS — N179 Acute kidney failure, unspecified: Secondary | ICD-10-CM | POA: Diagnosis not present

## 2019-01-25 DIAGNOSIS — E860 Dehydration: Secondary | ICD-10-CM | POA: Diagnosis not present

## 2019-01-25 DIAGNOSIS — E8589 Other amyloidosis: Secondary | ICD-10-CM | POA: Diagnosis not present

## 2019-01-25 DIAGNOSIS — C9000 Multiple myeloma not having achieved remission: Secondary | ICD-10-CM | POA: Diagnosis not present

## 2019-01-31 DIAGNOSIS — E8589 Other amyloidosis: Secondary | ICD-10-CM | POA: Diagnosis not present

## 2019-01-31 DIAGNOSIS — C9000 Multiple myeloma not having achieved remission: Secondary | ICD-10-CM | POA: Diagnosis not present

## 2019-01-31 DIAGNOSIS — E860 Dehydration: Secondary | ICD-10-CM | POA: Diagnosis not present

## 2019-01-31 DIAGNOSIS — E859 Amyloidosis, unspecified: Secondary | ICD-10-CM | POA: Diagnosis not present

## 2019-01-31 DIAGNOSIS — D892 Hypergammaglobulinemia, unspecified: Secondary | ICD-10-CM | POA: Diagnosis not present

## 2019-01-31 DIAGNOSIS — G629 Polyneuropathy, unspecified: Secondary | ICD-10-CM | POA: Diagnosis not present

## 2019-02-24 DIAGNOSIS — E8589 Other amyloidosis: Secondary | ICD-10-CM | POA: Diagnosis not present

## 2019-02-24 DIAGNOSIS — Q9359 Other deletions of part of a chromosome: Secondary | ICD-10-CM | POA: Diagnosis not present

## 2019-02-24 DIAGNOSIS — N185 Chronic kidney disease, stage 5: Secondary | ICD-10-CM | POA: Diagnosis not present

## 2019-03-03 DIAGNOSIS — R809 Proteinuria, unspecified: Secondary | ICD-10-CM | POA: Diagnosis not present

## 2019-03-03 DIAGNOSIS — E559 Vitamin D deficiency, unspecified: Secondary | ICD-10-CM | POA: Diagnosis not present

## 2019-03-03 DIAGNOSIS — N185 Chronic kidney disease, stage 5: Secondary | ICD-10-CM | POA: Diagnosis not present

## 2019-03-03 DIAGNOSIS — I120 Hypertensive chronic kidney disease with stage 5 chronic kidney disease or end stage renal disease: Secondary | ICD-10-CM | POA: Diagnosis not present

## 2019-03-03 DIAGNOSIS — D509 Iron deficiency anemia, unspecified: Secondary | ICD-10-CM | POA: Diagnosis not present

## 2019-03-03 DIAGNOSIS — Z79899 Other long term (current) drug therapy: Secondary | ICD-10-CM | POA: Diagnosis not present

## 2019-03-08 DIAGNOSIS — I10 Essential (primary) hypertension: Secondary | ICD-10-CM | POA: Diagnosis not present

## 2019-03-08 DIAGNOSIS — E1129 Type 2 diabetes mellitus with other diabetic kidney complication: Secondary | ICD-10-CM | POA: Diagnosis not present

## 2019-03-08 DIAGNOSIS — E8889 Other specified metabolic disorders: Secondary | ICD-10-CM | POA: Diagnosis not present

## 2019-03-08 DIAGNOSIS — N185 Chronic kidney disease, stage 5: Secondary | ICD-10-CM | POA: Diagnosis not present

## 2019-04-06 DIAGNOSIS — I10 Essential (primary) hypertension: Secondary | ICD-10-CM | POA: Diagnosis not present

## 2019-04-06 DIAGNOSIS — E119 Type 2 diabetes mellitus without complications: Secondary | ICD-10-CM | POA: Diagnosis not present

## 2019-04-06 DIAGNOSIS — N184 Chronic kidney disease, stage 4 (severe): Secondary | ICD-10-CM | POA: Diagnosis not present

## 2019-05-23 DIAGNOSIS — Z79899 Other long term (current) drug therapy: Secondary | ICD-10-CM | POA: Diagnosis not present

## 2019-05-23 DIAGNOSIS — R809 Proteinuria, unspecified: Secondary | ICD-10-CM | POA: Diagnosis not present

## 2019-05-23 DIAGNOSIS — N185 Chronic kidney disease, stage 5: Secondary | ICD-10-CM | POA: Diagnosis not present

## 2019-05-23 DIAGNOSIS — D509 Iron deficiency anemia, unspecified: Secondary | ICD-10-CM | POA: Diagnosis not present

## 2019-05-23 DIAGNOSIS — I120 Hypertensive chronic kidney disease with stage 5 chronic kidney disease or end stage renal disease: Secondary | ICD-10-CM | POA: Diagnosis not present

## 2019-05-23 DIAGNOSIS — E559 Vitamin D deficiency, unspecified: Secondary | ICD-10-CM | POA: Diagnosis not present

## 2019-06-07 DIAGNOSIS — E875 Hyperkalemia: Secondary | ICD-10-CM | POA: Diagnosis not present

## 2019-06-07 DIAGNOSIS — N185 Chronic kidney disease, stage 5: Secondary | ICD-10-CM | POA: Diagnosis not present

## 2019-06-07 DIAGNOSIS — I10 Essential (primary) hypertension: Secondary | ICD-10-CM | POA: Diagnosis not present

## 2019-06-07 DIAGNOSIS — E8889 Other specified metabolic disorders: Secondary | ICD-10-CM | POA: Diagnosis not present

## 2019-06-07 DIAGNOSIS — D631 Anemia in chronic kidney disease: Secondary | ICD-10-CM | POA: Diagnosis not present

## 2019-06-07 DIAGNOSIS — R809 Proteinuria, unspecified: Secondary | ICD-10-CM | POA: Diagnosis not present

## 2019-06-30 ENCOUNTER — Other Ambulatory Visit: Payer: Self-pay

## 2019-07-07 DIAGNOSIS — I10 Essential (primary) hypertension: Secondary | ICD-10-CM | POA: Diagnosis not present

## 2019-07-07 DIAGNOSIS — N184 Chronic kidney disease, stage 4 (severe): Secondary | ICD-10-CM | POA: Diagnosis not present

## 2019-07-07 DIAGNOSIS — E1121 Type 2 diabetes mellitus with diabetic nephropathy: Secondary | ICD-10-CM | POA: Diagnosis not present

## 2019-07-07 DIAGNOSIS — E785 Hyperlipidemia, unspecified: Secondary | ICD-10-CM | POA: Diagnosis not present

## 2019-07-12 DIAGNOSIS — Z79899 Other long term (current) drug therapy: Secondary | ICD-10-CM | POA: Diagnosis not present

## 2019-07-12 DIAGNOSIS — E559 Vitamin D deficiency, unspecified: Secondary | ICD-10-CM | POA: Diagnosis not present

## 2019-07-12 DIAGNOSIS — I120 Hypertensive chronic kidney disease with stage 5 chronic kidney disease or end stage renal disease: Secondary | ICD-10-CM | POA: Diagnosis not present

## 2019-07-12 DIAGNOSIS — N185 Chronic kidney disease, stage 5: Secondary | ICD-10-CM | POA: Diagnosis not present

## 2019-07-12 DIAGNOSIS — R809 Proteinuria, unspecified: Secondary | ICD-10-CM | POA: Diagnosis not present

## 2019-07-12 DIAGNOSIS — E875 Hyperkalemia: Secondary | ICD-10-CM | POA: Diagnosis not present

## 2019-07-12 DIAGNOSIS — D649 Anemia, unspecified: Secondary | ICD-10-CM | POA: Diagnosis not present

## 2019-08-19 DIAGNOSIS — Z23 Encounter for immunization: Secondary | ICD-10-CM | POA: Diagnosis not present

## 2019-09-16 DIAGNOSIS — N185 Chronic kidney disease, stage 5: Secondary | ICD-10-CM | POA: Diagnosis not present

## 2019-09-16 DIAGNOSIS — Z79899 Other long term (current) drug therapy: Secondary | ICD-10-CM | POA: Diagnosis not present

## 2019-09-16 DIAGNOSIS — D631 Anemia in chronic kidney disease: Secondary | ICD-10-CM | POA: Diagnosis not present

## 2019-09-16 DIAGNOSIS — E875 Hyperkalemia: Secondary | ICD-10-CM | POA: Diagnosis not present

## 2019-09-16 DIAGNOSIS — R809 Proteinuria, unspecified: Secondary | ICD-10-CM | POA: Diagnosis not present

## 2019-09-16 DIAGNOSIS — I120 Hypertensive chronic kidney disease with stage 5 chronic kidney disease or end stage renal disease: Secondary | ICD-10-CM | POA: Diagnosis not present

## 2019-09-16 DIAGNOSIS — E559 Vitamin D deficiency, unspecified: Secondary | ICD-10-CM | POA: Diagnosis not present

## 2019-09-22 DIAGNOSIS — E211 Secondary hyperparathyroidism, not elsewhere classified: Secondary | ICD-10-CM | POA: Diagnosis not present

## 2019-09-22 DIAGNOSIS — D631 Anemia in chronic kidney disease: Secondary | ICD-10-CM | POA: Diagnosis not present

## 2019-09-22 DIAGNOSIS — E871 Hypo-osmolality and hyponatremia: Secondary | ICD-10-CM | POA: Diagnosis not present

## 2019-09-22 DIAGNOSIS — N185 Chronic kidney disease, stage 5: Secondary | ICD-10-CM | POA: Diagnosis not present

## 2019-09-22 DIAGNOSIS — E1122 Type 2 diabetes mellitus with diabetic chronic kidney disease: Secondary | ICD-10-CM | POA: Diagnosis not present

## 2019-09-22 DIAGNOSIS — R809 Proteinuria, unspecified: Secondary | ICD-10-CM | POA: Diagnosis not present

## 2019-09-22 DIAGNOSIS — N189 Chronic kidney disease, unspecified: Secondary | ICD-10-CM | POA: Diagnosis not present

## 2019-09-22 DIAGNOSIS — E1129 Type 2 diabetes mellitus with other diabetic kidney complication: Secondary | ICD-10-CM | POA: Diagnosis not present

## 2019-09-22 DIAGNOSIS — E872 Acidosis: Secondary | ICD-10-CM | POA: Diagnosis not present

## 2019-09-22 DIAGNOSIS — E875 Hyperkalemia: Secondary | ICD-10-CM | POA: Diagnosis not present

## 2019-10-06 DIAGNOSIS — E7849 Other hyperlipidemia: Secondary | ICD-10-CM | POA: Diagnosis not present

## 2019-10-06 DIAGNOSIS — E1121 Type 2 diabetes mellitus with diabetic nephropathy: Secondary | ICD-10-CM | POA: Diagnosis not present

## 2019-10-06 DIAGNOSIS — Z125 Encounter for screening for malignant neoplasm of prostate: Secondary | ICD-10-CM | POA: Diagnosis not present

## 2019-10-06 DIAGNOSIS — E785 Hyperlipidemia, unspecified: Secondary | ICD-10-CM | POA: Diagnosis not present

## 2019-10-06 DIAGNOSIS — N184 Chronic kidney disease, stage 4 (severe): Secondary | ICD-10-CM | POA: Diagnosis not present

## 2019-10-06 DIAGNOSIS — Z0000 Encounter for general adult medical examination without abnormal findings: Secondary | ICD-10-CM | POA: Diagnosis not present

## 2019-10-06 DIAGNOSIS — I10 Essential (primary) hypertension: Secondary | ICD-10-CM | POA: Diagnosis not present

## 2019-10-06 DIAGNOSIS — Z1389 Encounter for screening for other disorder: Secondary | ICD-10-CM | POA: Diagnosis not present

## 2019-10-28 DIAGNOSIS — N185 Chronic kidney disease, stage 5: Secondary | ICD-10-CM | POA: Diagnosis not present

## 2019-10-28 DIAGNOSIS — R809 Proteinuria, unspecified: Secondary | ICD-10-CM | POA: Diagnosis not present

## 2019-10-28 DIAGNOSIS — Z79899 Other long term (current) drug therapy: Secondary | ICD-10-CM | POA: Diagnosis not present

## 2019-10-28 DIAGNOSIS — E559 Vitamin D deficiency, unspecified: Secondary | ICD-10-CM | POA: Diagnosis not present

## 2019-10-28 DIAGNOSIS — E876 Hypokalemia: Secondary | ICD-10-CM | POA: Diagnosis not present

## 2019-10-28 DIAGNOSIS — D638 Anemia in other chronic diseases classified elsewhere: Secondary | ICD-10-CM | POA: Diagnosis not present

## 2019-11-03 DIAGNOSIS — E1122 Type 2 diabetes mellitus with diabetic chronic kidney disease: Secondary | ICD-10-CM | POA: Diagnosis not present

## 2019-11-03 DIAGNOSIS — R809 Proteinuria, unspecified: Secondary | ICD-10-CM | POA: Diagnosis not present

## 2019-11-03 DIAGNOSIS — E871 Hypo-osmolality and hyponatremia: Secondary | ICD-10-CM | POA: Diagnosis not present

## 2019-11-03 DIAGNOSIS — E211 Secondary hyperparathyroidism, not elsewhere classified: Secondary | ICD-10-CM | POA: Diagnosis not present

## 2019-11-03 DIAGNOSIS — N185 Chronic kidney disease, stage 5: Secondary | ICD-10-CM | POA: Diagnosis not present

## 2019-11-03 DIAGNOSIS — E872 Acidosis: Secondary | ICD-10-CM | POA: Diagnosis not present

## 2019-11-03 DIAGNOSIS — E875 Hyperkalemia: Secondary | ICD-10-CM | POA: Diagnosis not present

## 2019-11-03 DIAGNOSIS — N189 Chronic kidney disease, unspecified: Secondary | ICD-10-CM | POA: Diagnosis not present

## 2019-11-03 DIAGNOSIS — E1129 Type 2 diabetes mellitus with other diabetic kidney complication: Secondary | ICD-10-CM | POA: Diagnosis not present

## 2019-11-03 DIAGNOSIS — D631 Anemia in chronic kidney disease: Secondary | ICD-10-CM | POA: Diagnosis not present

## 2019-12-15 DIAGNOSIS — E872 Acidosis: Secondary | ICD-10-CM | POA: Diagnosis not present

## 2019-12-15 DIAGNOSIS — E871 Hypo-osmolality and hyponatremia: Secondary | ICD-10-CM | POA: Diagnosis not present

## 2019-12-15 DIAGNOSIS — E875 Hyperkalemia: Secondary | ICD-10-CM | POA: Diagnosis not present

## 2019-12-15 DIAGNOSIS — R809 Proteinuria, unspecified: Secondary | ICD-10-CM | POA: Diagnosis not present

## 2019-12-15 DIAGNOSIS — D631 Anemia in chronic kidney disease: Secondary | ICD-10-CM | POA: Diagnosis not present

## 2019-12-15 DIAGNOSIS — E1122 Type 2 diabetes mellitus with diabetic chronic kidney disease: Secondary | ICD-10-CM | POA: Diagnosis not present

## 2019-12-15 DIAGNOSIS — N185 Chronic kidney disease, stage 5: Secondary | ICD-10-CM | POA: Diagnosis not present

## 2019-12-15 DIAGNOSIS — E211 Secondary hyperparathyroidism, not elsewhere classified: Secondary | ICD-10-CM | POA: Diagnosis not present

## 2019-12-15 DIAGNOSIS — E1129 Type 2 diabetes mellitus with other diabetic kidney complication: Secondary | ICD-10-CM | POA: Diagnosis not present

## 2019-12-19 DIAGNOSIS — R809 Proteinuria, unspecified: Secondary | ICD-10-CM | POA: Diagnosis not present

## 2019-12-19 DIAGNOSIS — D631 Anemia in chronic kidney disease: Secondary | ICD-10-CM | POA: Diagnosis not present

## 2019-12-19 DIAGNOSIS — N185 Chronic kidney disease, stage 5: Secondary | ICD-10-CM | POA: Diagnosis not present

## 2019-12-19 DIAGNOSIS — E1122 Type 2 diabetes mellitus with diabetic chronic kidney disease: Secondary | ICD-10-CM | POA: Diagnosis not present

## 2019-12-19 DIAGNOSIS — N189 Chronic kidney disease, unspecified: Secondary | ICD-10-CM | POA: Diagnosis not present

## 2019-12-19 DIAGNOSIS — E875 Hyperkalemia: Secondary | ICD-10-CM | POA: Diagnosis not present

## 2019-12-19 DIAGNOSIS — E1129 Type 2 diabetes mellitus with other diabetic kidney complication: Secondary | ICD-10-CM | POA: Diagnosis not present

## 2019-12-19 DIAGNOSIS — E871 Hypo-osmolality and hyponatremia: Secondary | ICD-10-CM | POA: Diagnosis not present

## 2019-12-19 DIAGNOSIS — E211 Secondary hyperparathyroidism, not elsewhere classified: Secondary | ICD-10-CM | POA: Diagnosis not present

## 2019-12-23 DIAGNOSIS — D509 Iron deficiency anemia, unspecified: Secondary | ICD-10-CM | POA: Diagnosis not present

## 2019-12-23 DIAGNOSIS — N185 Chronic kidney disease, stage 5: Secondary | ICD-10-CM | POA: Diagnosis not present

## 2019-12-24 DIAGNOSIS — N189 Chronic kidney disease, unspecified: Secondary | ICD-10-CM | POA: Diagnosis not present

## 2019-12-24 DIAGNOSIS — E7800 Pure hypercholesterolemia, unspecified: Secondary | ICD-10-CM | POA: Diagnosis not present

## 2019-12-24 DIAGNOSIS — Z23 Encounter for immunization: Secondary | ICD-10-CM | POA: Diagnosis not present

## 2019-12-24 DIAGNOSIS — W312XXA Contact with powered woodworking and forming machines, initial encounter: Secondary | ICD-10-CM | POA: Diagnosis not present

## 2019-12-24 DIAGNOSIS — S61012A Laceration without foreign body of left thumb without damage to nail, initial encounter: Secondary | ICD-10-CM | POA: Diagnosis not present

## 2019-12-24 DIAGNOSIS — Z79899 Other long term (current) drug therapy: Secondary | ICD-10-CM | POA: Diagnosis not present

## 2019-12-24 DIAGNOSIS — E1122 Type 2 diabetes mellitus with diabetic chronic kidney disease: Secondary | ICD-10-CM | POA: Diagnosis not present

## 2019-12-30 DIAGNOSIS — N185 Chronic kidney disease, stage 5: Secondary | ICD-10-CM | POA: Diagnosis not present

## 2019-12-30 DIAGNOSIS — D509 Iron deficiency anemia, unspecified: Secondary | ICD-10-CM | POA: Diagnosis not present

## 2020-01-09 DIAGNOSIS — E1121 Type 2 diabetes mellitus with diabetic nephropathy: Secondary | ICD-10-CM | POA: Diagnosis not present

## 2020-01-09 DIAGNOSIS — I10 Essential (primary) hypertension: Secondary | ICD-10-CM | POA: Diagnosis not present

## 2020-01-09 DIAGNOSIS — E7849 Other hyperlipidemia: Secondary | ICD-10-CM | POA: Diagnosis not present

## 2020-01-09 DIAGNOSIS — N184 Chronic kidney disease, stage 4 (severe): Secondary | ICD-10-CM | POA: Diagnosis not present

## 2020-01-10 DIAGNOSIS — M14671 Charcot's joint, right ankle and foot: Secondary | ICD-10-CM | POA: Diagnosis not present

## 2020-01-25 DIAGNOSIS — R52 Pain, unspecified: Secondary | ICD-10-CM | POA: Diagnosis not present

## 2020-01-25 DIAGNOSIS — R410 Disorientation, unspecified: Secondary | ICD-10-CM | POA: Diagnosis not present

## 2020-01-25 DIAGNOSIS — E860 Dehydration: Secondary | ICD-10-CM | POA: Diagnosis not present

## 2020-01-25 DIAGNOSIS — T8469XA Infection and inflammatory reaction due to internal fixation device of other site, initial encounter: Secondary | ICD-10-CM | POA: Diagnosis present

## 2020-01-25 DIAGNOSIS — E871 Hypo-osmolality and hyponatremia: Secondary | ICD-10-CM | POA: Diagnosis present

## 2020-01-25 DIAGNOSIS — G40409 Other generalized epilepsy and epileptic syndromes, not intractable, without status epilepticus: Secondary | ICD-10-CM | POA: Diagnosis not present

## 2020-01-25 DIAGNOSIS — A419 Sepsis, unspecified organism: Secondary | ICD-10-CM | POA: Diagnosis not present

## 2020-01-25 DIAGNOSIS — R569 Unspecified convulsions: Secondary | ICD-10-CM | POA: Diagnosis not present

## 2020-01-25 DIAGNOSIS — D649 Anemia, unspecified: Secondary | ICD-10-CM | POA: Diagnosis present

## 2020-01-25 DIAGNOSIS — N185 Chronic kidney disease, stage 5: Secondary | ICD-10-CM | POA: Diagnosis not present

## 2020-01-25 DIAGNOSIS — Z20822 Contact with and (suspected) exposure to covid-19: Secondary | ICD-10-CM | POA: Diagnosis not present

## 2020-01-25 DIAGNOSIS — I10 Essential (primary) hypertension: Secondary | ICD-10-CM | POA: Diagnosis not present

## 2020-01-25 DIAGNOSIS — I120 Hypertensive chronic kidney disease with stage 5 chronic kidney disease or end stage renal disease: Secondary | ICD-10-CM | POA: Diagnosis present

## 2020-01-25 DIAGNOSIS — E785 Hyperlipidemia, unspecified: Secondary | ICD-10-CM | POA: Diagnosis present

## 2020-01-25 DIAGNOSIS — A4181 Sepsis due to Enterococcus: Secondary | ICD-10-CM | POA: Diagnosis present

## 2020-02-14 DIAGNOSIS — M65071 Abscess of tendon sheath, right ankle and foot: Secondary | ICD-10-CM | POA: Diagnosis not present

## 2020-02-16 DIAGNOSIS — Z23 Encounter for immunization: Secondary | ICD-10-CM | POA: Diagnosis not present

## 2020-02-28 DIAGNOSIS — N185 Chronic kidney disease, stage 5: Secondary | ICD-10-CM | POA: Diagnosis not present

## 2020-02-28 DIAGNOSIS — D631 Anemia in chronic kidney disease: Secondary | ICD-10-CM | POA: Diagnosis not present

## 2020-02-28 DIAGNOSIS — E559 Vitamin D deficiency, unspecified: Secondary | ICD-10-CM | POA: Diagnosis not present

## 2020-02-29 DIAGNOSIS — E211 Secondary hyperparathyroidism, not elsewhere classified: Secondary | ICD-10-CM | POA: Diagnosis not present

## 2020-02-29 DIAGNOSIS — R809 Proteinuria, unspecified: Secondary | ICD-10-CM | POA: Diagnosis not present

## 2020-02-29 DIAGNOSIS — N185 Chronic kidney disease, stage 5: Secondary | ICD-10-CM | POA: Diagnosis not present

## 2020-02-29 DIAGNOSIS — E872 Acidosis: Secondary | ICD-10-CM | POA: Diagnosis not present

## 2020-02-29 DIAGNOSIS — E1122 Type 2 diabetes mellitus with diabetic chronic kidney disease: Secondary | ICD-10-CM | POA: Diagnosis not present

## 2020-02-29 DIAGNOSIS — D631 Anemia in chronic kidney disease: Secondary | ICD-10-CM | POA: Diagnosis not present

## 2020-02-29 DIAGNOSIS — E1129 Type 2 diabetes mellitus with other diabetic kidney complication: Secondary | ICD-10-CM | POA: Diagnosis not present

## 2020-02-29 DIAGNOSIS — E871 Hypo-osmolality and hyponatremia: Secondary | ICD-10-CM | POA: Diagnosis not present

## 2020-02-29 DIAGNOSIS — N189 Chronic kidney disease, unspecified: Secondary | ICD-10-CM | POA: Diagnosis not present

## 2020-02-29 DIAGNOSIS — E875 Hyperkalemia: Secondary | ICD-10-CM | POA: Diagnosis not present

## 2020-03-15 DIAGNOSIS — Z23 Encounter for immunization: Secondary | ICD-10-CM | POA: Diagnosis not present

## 2020-04-09 DIAGNOSIS — R809 Proteinuria, unspecified: Secondary | ICD-10-CM | POA: Diagnosis not present

## 2020-04-09 DIAGNOSIS — E7849 Other hyperlipidemia: Secondary | ICD-10-CM | POA: Diagnosis not present

## 2020-04-09 DIAGNOSIS — E1122 Type 2 diabetes mellitus with diabetic chronic kidney disease: Secondary | ICD-10-CM | POA: Diagnosis not present

## 2020-04-09 DIAGNOSIS — I10 Essential (primary) hypertension: Secondary | ICD-10-CM | POA: Diagnosis not present

## 2020-04-09 DIAGNOSIS — E1121 Type 2 diabetes mellitus with diabetic nephropathy: Secondary | ICD-10-CM | POA: Diagnosis not present

## 2020-04-09 DIAGNOSIS — N185 Chronic kidney disease, stage 5: Secondary | ICD-10-CM | POA: Diagnosis not present

## 2020-04-09 DIAGNOSIS — E559 Vitamin D deficiency, unspecified: Secondary | ICD-10-CM | POA: Diagnosis not present

## 2020-04-09 DIAGNOSIS — N184 Chronic kidney disease, stage 4 (severe): Secondary | ICD-10-CM | POA: Diagnosis not present

## 2020-04-09 DIAGNOSIS — D631 Anemia in chronic kidney disease: Secondary | ICD-10-CM | POA: Diagnosis not present

## 2020-04-09 DIAGNOSIS — E0580 Other thyrotoxicosis without thyrotoxic crisis or storm: Secondary | ICD-10-CM | POA: Diagnosis not present

## 2020-04-09 DIAGNOSIS — Z79899 Other long term (current) drug therapy: Secondary | ICD-10-CM | POA: Diagnosis not present

## 2020-04-09 DIAGNOSIS — M818 Other osteoporosis without current pathological fracture: Secondary | ICD-10-CM | POA: Diagnosis not present

## 2020-04-13 DIAGNOSIS — E871 Hypo-osmolality and hyponatremia: Secondary | ICD-10-CM | POA: Diagnosis not present

## 2020-04-13 DIAGNOSIS — E1129 Type 2 diabetes mellitus with other diabetic kidney complication: Secondary | ICD-10-CM | POA: Diagnosis not present

## 2020-04-13 DIAGNOSIS — E1122 Type 2 diabetes mellitus with diabetic chronic kidney disease: Secondary | ICD-10-CM | POA: Diagnosis not present

## 2020-04-13 DIAGNOSIS — D631 Anemia in chronic kidney disease: Secondary | ICD-10-CM | POA: Diagnosis not present

## 2020-04-13 DIAGNOSIS — E211 Secondary hyperparathyroidism, not elsewhere classified: Secondary | ICD-10-CM | POA: Diagnosis not present

## 2020-04-13 DIAGNOSIS — E872 Acidosis: Secondary | ICD-10-CM | POA: Diagnosis not present

## 2020-04-13 DIAGNOSIS — N189 Chronic kidney disease, unspecified: Secondary | ICD-10-CM | POA: Diagnosis not present

## 2020-04-13 DIAGNOSIS — R809 Proteinuria, unspecified: Secondary | ICD-10-CM | POA: Diagnosis not present

## 2020-04-13 DIAGNOSIS — E875 Hyperkalemia: Secondary | ICD-10-CM | POA: Diagnosis not present

## 2020-04-13 DIAGNOSIS — N185 Chronic kidney disease, stage 5: Secondary | ICD-10-CM | POA: Diagnosis not present

## 2020-06-15 DIAGNOSIS — N189 Chronic kidney disease, unspecified: Secondary | ICD-10-CM | POA: Diagnosis not present

## 2020-06-15 DIAGNOSIS — E211 Secondary hyperparathyroidism, not elsewhere classified: Secondary | ICD-10-CM | POA: Diagnosis not present

## 2020-06-15 DIAGNOSIS — N185 Chronic kidney disease, stage 5: Secondary | ICD-10-CM | POA: Diagnosis not present

## 2020-06-15 DIAGNOSIS — E1122 Type 2 diabetes mellitus with diabetic chronic kidney disease: Secondary | ICD-10-CM | POA: Diagnosis not present

## 2020-06-15 DIAGNOSIS — E1129 Type 2 diabetes mellitus with other diabetic kidney complication: Secondary | ICD-10-CM | POA: Diagnosis not present

## 2020-06-15 DIAGNOSIS — D631 Anemia in chronic kidney disease: Secondary | ICD-10-CM | POA: Diagnosis not present

## 2020-06-15 DIAGNOSIS — E875 Hyperkalemia: Secondary | ICD-10-CM | POA: Diagnosis not present

## 2020-06-15 DIAGNOSIS — R809 Proteinuria, unspecified: Secondary | ICD-10-CM | POA: Diagnosis not present

## 2020-06-28 DIAGNOSIS — E119 Type 2 diabetes mellitus without complications: Secondary | ICD-10-CM | POA: Diagnosis not present

## 2020-06-28 DIAGNOSIS — H524 Presbyopia: Secondary | ICD-10-CM | POA: Diagnosis not present

## 2020-06-28 DIAGNOSIS — H5203 Hypermetropia, bilateral: Secondary | ICD-10-CM | POA: Diagnosis not present

## 2020-06-28 DIAGNOSIS — H25813 Combined forms of age-related cataract, bilateral: Secondary | ICD-10-CM | POA: Diagnosis not present

## 2020-06-28 DIAGNOSIS — H52223 Regular astigmatism, bilateral: Secondary | ICD-10-CM | POA: Diagnosis not present

## 2020-07-11 DIAGNOSIS — N184 Chronic kidney disease, stage 4 (severe): Secondary | ICD-10-CM | POA: Diagnosis not present

## 2020-07-11 DIAGNOSIS — M818 Other osteoporosis without current pathological fracture: Secondary | ICD-10-CM | POA: Diagnosis not present

## 2020-07-11 DIAGNOSIS — I10 Essential (primary) hypertension: Secondary | ICD-10-CM | POA: Diagnosis not present

## 2020-07-11 DIAGNOSIS — E0580 Other thyrotoxicosis without thyrotoxic crisis or storm: Secondary | ICD-10-CM | POA: Diagnosis not present

## 2020-07-11 DIAGNOSIS — E7849 Other hyperlipidemia: Secondary | ICD-10-CM | POA: Diagnosis not present

## 2020-07-11 DIAGNOSIS — E1121 Type 2 diabetes mellitus with diabetic nephropathy: Secondary | ICD-10-CM | POA: Diagnosis not present

## 2020-07-26 DIAGNOSIS — N182 Chronic kidney disease, stage 2 (mild): Secondary | ICD-10-CM | POA: Diagnosis not present

## 2020-07-26 DIAGNOSIS — R809 Proteinuria, unspecified: Secondary | ICD-10-CM | POA: Diagnosis not present

## 2020-07-26 DIAGNOSIS — N189 Chronic kidney disease, unspecified: Secondary | ICD-10-CM | POA: Diagnosis not present

## 2020-07-26 DIAGNOSIS — D631 Anemia in chronic kidney disease: Secondary | ICD-10-CM | POA: Diagnosis not present

## 2020-08-01 ENCOUNTER — Other Ambulatory Visit (HOSPITAL_COMMUNITY): Payer: Self-pay | Admitting: Nephrology

## 2020-08-01 DIAGNOSIS — E211 Secondary hyperparathyroidism, not elsewhere classified: Secondary | ICD-10-CM | POA: Diagnosis not present

## 2020-08-01 DIAGNOSIS — N2581 Secondary hyperparathyroidism of renal origin: Secondary | ICD-10-CM

## 2020-08-01 DIAGNOSIS — N185 Chronic kidney disease, stage 5: Secondary | ICD-10-CM | POA: Diagnosis not present

## 2020-08-01 DIAGNOSIS — N189 Chronic kidney disease, unspecified: Secondary | ICD-10-CM | POA: Diagnosis not present

## 2020-08-01 DIAGNOSIS — E1122 Type 2 diabetes mellitus with diabetic chronic kidney disease: Secondary | ICD-10-CM

## 2020-08-01 DIAGNOSIS — D631 Anemia in chronic kidney disease: Secondary | ICD-10-CM | POA: Diagnosis not present

## 2020-08-01 DIAGNOSIS — E1129 Type 2 diabetes mellitus with other diabetic kidney complication: Secondary | ICD-10-CM | POA: Diagnosis not present

## 2020-08-01 DIAGNOSIS — R809 Proteinuria, unspecified: Secondary | ICD-10-CM | POA: Diagnosis not present

## 2020-08-01 DIAGNOSIS — E875 Hyperkalemia: Secondary | ICD-10-CM | POA: Diagnosis not present

## 2020-08-10 ENCOUNTER — Other Ambulatory Visit: Payer: Self-pay | Admitting: Student

## 2020-08-13 ENCOUNTER — Other Ambulatory Visit: Payer: Self-pay

## 2020-08-13 ENCOUNTER — Encounter (HOSPITAL_COMMUNITY): Payer: Self-pay

## 2020-08-13 ENCOUNTER — Ambulatory Visit (HOSPITAL_COMMUNITY)
Admission: RE | Admit: 2020-08-13 | Discharge: 2020-08-13 | Disposition: A | Discharge status: Home / Self Care | Payer: Medicare Other | Source: Ambulatory Visit | Attending: Nephrology | Admitting: Nephrology

## 2020-08-13 DIAGNOSIS — Z79899 Other long term (current) drug therapy: Secondary | ICD-10-CM | POA: Diagnosis not present

## 2020-08-13 DIAGNOSIS — E1122 Type 2 diabetes mellitus with diabetic chronic kidney disease: Secondary | ICD-10-CM | POA: Insufficient documentation

## 2020-08-13 DIAGNOSIS — Z7901 Long term (current) use of anticoagulants: Secondary | ICD-10-CM | POA: Insufficient documentation

## 2020-08-13 DIAGNOSIS — N185 Chronic kidney disease, stage 5: Secondary | ICD-10-CM | POA: Diagnosis not present

## 2020-08-13 DIAGNOSIS — R809 Proteinuria, unspecified: Secondary | ICD-10-CM | POA: Diagnosis not present

## 2020-08-13 DIAGNOSIS — Z7984 Long term (current) use of oral hypoglycemic drugs: Secondary | ICD-10-CM | POA: Insufficient documentation

## 2020-08-13 DIAGNOSIS — N2581 Secondary hyperparathyroidism of renal origin: Secondary | ICD-10-CM

## 2020-08-13 DIAGNOSIS — D631 Anemia in chronic kidney disease: Secondary | ICD-10-CM | POA: Diagnosis not present

## 2020-08-13 DIAGNOSIS — E1129 Type 2 diabetes mellitus with other diabetic kidney complication: Secondary | ICD-10-CM

## 2020-08-13 DIAGNOSIS — N189 Chronic kidney disease, unspecified: Secondary | ICD-10-CM | POA: Diagnosis not present

## 2020-08-13 LAB — CBC
HCT: 30.4 % — ABNORMAL LOW (ref 39.0–52.0)
Hemoglobin: 9.5 g/dL — ABNORMAL LOW (ref 13.0–17.0)
MCH: 31.4 pg (ref 26.0–34.0)
MCHC: 31.3 g/dL (ref 30.0–36.0)
MCV: 100.3 fL — ABNORMAL HIGH (ref 80.0–100.0)
Platelets: 238 10*3/uL (ref 150–400)
RBC: 3.03 MIL/uL — ABNORMAL LOW (ref 4.22–5.81)
RDW: 12.8 % (ref 11.5–15.5)
WBC: 4.6 10*3/uL (ref 4.0–10.5)
nRBC: 0 % (ref 0.0–0.2)

## 2020-08-13 LAB — PROTIME-INR
INR: 1.1 (ref 0.8–1.2)
Prothrombin Time: 13.9 seconds (ref 11.4–15.2)

## 2020-08-13 LAB — APTT: aPTT: 30 seconds (ref 24–36)

## 2020-08-13 LAB — GLUCOSE, CAPILLARY: Glucose-Capillary: 103 mg/dL — ABNORMAL HIGH (ref 70–99)

## 2020-08-13 MED ORDER — MIDAZOLAM HCL 2 MG/2ML IJ SOLN
INTRAMUSCULAR | Status: AC | PRN
  Administered 2020-08-13: 1 mg via INTRAVENOUS

## 2020-08-13 MED ORDER — SODIUM CHLORIDE 0.9 % IV SOLN: INTRAVENOUS | Status: DC

## 2020-08-13 MED ORDER — FENTANYL CITRATE (PF) 100 MCG/2ML IJ SOLN
INTRAMUSCULAR | Status: AC | PRN
  Administered 2020-08-13: 50 ug via INTRAVENOUS

## 2020-08-13 MED ORDER — GELATIN ABSORBABLE 12-7 MM EX MISC
CUTANEOUS | Status: AC
  Filled 2020-08-13: qty 1

## 2020-08-13 MED ORDER — MIDAZOLAM HCL 2 MG/2ML IJ SOLN
INTRAMUSCULAR | Status: AC
  Filled 2020-08-13: qty 2

## 2020-08-13 MED ORDER — FENTANYL CITRATE (PF) 100 MCG/2ML IJ SOLN
INTRAMUSCULAR | Status: AC
  Filled 2020-08-13: qty 2

## 2020-08-13 MED ORDER — LIDOCAINE HCL (PF) 1 % IJ SOLN
INTRAMUSCULAR | Status: AC
  Administered 2020-08-13: 10 mL
  Filled 2020-08-13: qty 30

## 2020-08-13 NOTE — Procedures (Signed)
Interventional Radiology Procedure Note  Procedure: US guided biopsy of left kidney, medical renal Complications: None EBL: None Recommendations: - Bedrest 2 hours.   - Routine wound care - Follow up pathology - Advance diet   Signed,  Obbie Lewallen, DO   

## 2020-08-13 NOTE — H&P (Signed)
Chief Complaint: Patient was seen in consultation today for random renal biopsy at the request of [place]  Referring Physician(s): [place]  Supervising Physician: Corrie Mckusick  Patient Status: [facility] - Out-pt  History of Present Illness: [name] is a 69 y.o. male   Known chronic kidney disease stage 5 Proteinuria Diabetes Has followed with Dr Theador Hawthorne x 2 yrs Creatinine has actually lowered per pt (from 11 to 4)  Scheduled now for random renal biopsy  Past Medical History:  Diagnosis Date  . Diabetes mellitus without complication ([place])   . Diabetes mellitus, type 2 ([place])     Past Surgical History:  Procedure Laterality Date  . EXTERNAL FIXATION LEG Right 06/08/2018   Procedure: EXTERNAL FIXATION COMMINUTED FIBULA FRACTURE;  Surgeon: Netta Cedars, MD;  Location: [place];  Service: Orthopedics;  Laterality: Right;  . EXTERNAL FIXATION REMOVAL Right 06/11/2018   Procedure: REMOVAL EXTERNAL FIXATION LEG;  Surgeon: Shona Needles, MD;  Location: [place];  Service: Orthopedics;  Laterality: Right;  . I & D EXTREMITY Right 06/08/2018   Procedure: IRRIGATION AND DEBRIDEMENT OPEN TIBIA FRACTURE;  Surgeon: Netta Cedars, MD;  Location: [place];  Service: Orthopedics;  Laterality: Right;  . I & D EXTREMITY Right 06/11/2018   Procedure: IRRIGATION AND DEBRIDEMENT EXTREMITY;  Surgeon: Shona Needles, MD;  Location: [place];  Service: Orthopedics;  Laterality: Right;  . NOSE SURGERY    . ORIF ANKLE FRACTURE Right 06/08/2018   Procedure: OPEN REDUCTION INTERNAL FIXATION (ORIF) MEDIAL MALLEOLUS;  Surgeon: Netta Cedars, MD;  Location: [place];  Service: Orthopedics;  Laterality: Right;  . ORIF ANKLE FRACTURE Right 06/11/2018   Procedure: OPEN REDUCTION INTERNAL FIXATION (ORIF) ANKLE FRACTURE;  Surgeon: Shona Needles, MD;  Location: [place];  Service: Orthopedics;  Laterality: Right;  . TIBIA DEBRIDEMENT Right 06/08/2018    Allergies: Patient has no known  allergies.  Medications: Prior to Admission medications   Medication Sig Start Date End Date Taking? Authorizing Provider  amLODipine (NORVASC) 5 MG tablet Take 5 mg by mouth daily.   Yes [provider]  simvastatin (ZOCOR) 20 MG tablet Take 20 mg by mouth every evening. 04/30/18  Yes [provider]  sodium bicarbonate 650 MG tablet Take 650 mg by mouth 4 (four) times daily.  05/06/18  Yes [provider]  cyclobenzaprine (FLEXERIL) 10 MG tablet Take 1 tablet by mouth twice per day.  Patient not taking: Reported on 08/07/2020    [provider]  metFORMIN (GLUMETZA) 500 MG (MOD) 24 hr tablet Take 1 tablet by mouth daily.  Patient not taking: Reported on 08/07/2020    [provider]     History reviewed. No pertinent family history.  Social History   Socioeconomic History  . Marital status: Single    Spouse name: Not on file  . Number of children: Not on file  . Years of education: Not on file  . Highest education level: Not on file  Occupational History  . Not on file  Tobacco Use  . Smoking status: Never Smoker  . Smokeless tobacco: Never Used  Vaping Use  . Vaping Use: Never used  Substance and Sexual Activity  . Alcohol use: Yes    Comment: occasional  . Drug use: Never  . Sexual activity: Not on file  Other Topics Concern  . Not on file  Social History Narrative   ** Merged History Encounter **       Social Determinants of Health  Financial Resource Strain:   . Difficulty of Paying Living Expenses: Not on file  Food Insecurity:   . Worried About Charity fundraiser in the Last Year: Not on file  . Ran Out of Food in the Last Year: Not on file  Transportation Needs:   . Lack of Transportation (Medical): Not on file  . Lack of Transportation (Non-Medical): Not on file  Physical Activity:   . Days of Exercise per Week: Not on file  . Minutes of Exercise per Session: Not on file  Stress:   . Feeling of Stress : Not on  file  Social Connections:   . Frequency of Communication with Friends and Family: Not on file  . Frequency of Social Gatherings with Friends and Family: Not on file  . Attends Religious Services: Not on file  . Active Member of Clubs or Organizations: Not on file  . Attends Archivist Meetings: Not on file  . Marital Status: Not on file    Review of Systems: A 12 point ROS discussed and pertinent positives are indicated in the HPI above.  All other systems are negative.  Review of Systems  Constitutional: Negative for activity change, fatigue and fever.  Respiratory: Negative for cough and shortness of breath.   Cardiovascular: Negative for chest pain.  Gastrointestinal: Negative for abdominal pain.  Neurological: Negative for weakness.  Psychiatric/Behavioral: Negative for behavioral problems and confusion.    Vital Signs: BP 120/60   Pulse 69   Temp (!) 97.5 F (36.4 C) (Oral)   Resp 14   Ht 6\' 2"  (1.88 m)   Wt 175 lb (79.4 kg)   SpO2 100%   BMI 22.47 kg/m   Physical Exam Vitals reviewed.  Cardiovascular:     Rate and Rhythm: Normal rate and regular rhythm.     Heart sounds: Normal heart sounds.  Pulmonary:     Effort: Pulmonary effort is normal.     Breath sounds: Normal breath sounds.  Abdominal:     Palpations: Abdomen is soft.     Tenderness: There is no abdominal tenderness.  Musculoskeletal:        General: Normal range of motion.  Skin:    General: Skin is warm and dry.  Neurological:     Mental Status: He is alert and oriented to person, place, and time.  Psychiatric:        Behavior: Behavior normal.     Imaging: No results found.  Labs:  CBC: Recent Labs    08/13/20 0652  WBC 4.6  HGB 9.5*  HCT 30.4*  PLT 238    COAGS: No results for input(s): INR, APTT in the last 8760 hours.  BMP: No results for input(s): NA, K, CL, CO2, GLUCOSE, BUN, CALCIUM, CREATININE, GFRNONAA, GFRAA in the last 8760 hours.  Invalid input(s):  CMP  LIVER FUNCTION TESTS: No results for input(s): BILITOT, AST, ALT, ALKPHOS, PROT, ALBUMIN in the last 8760 hours.  TUMOR MARKERS: No results for input(s): AFPTM, CEA, CA199, CHROMGRNA in the last 8760 hours.  Assessment and Plan:  CKD 5 Proteinuria Diabetes Scheduled for random renal biopsy per Nephrology Risks and benefits of Random renal bx was discussed with the patient and/or patient's family including, but not limited to bleeding, infection, damage to adjacent structures or low yield requiring additional tests.  All of the questions were answered and there is agreement to proceed. Consent signed and in chart.   Thank you for this interesting consult.  I greatly enjoyed  meeting [name] and look forward to participating in their care.  A copy of this report was sent to the requesting provider on this date.  Electronically Signed: Lavonia Drafts, PA-C 08/13/2020, 7:17 AM   I spent a total of  30 Minutes   in face to face in clinical consultation, greater than 50% of which was counseling/coordinating care for random renal bx

## 2020-08-13 NOTE — Discharge Instructions (Signed)
Percutaneous Kidney Biopsy, Care After This sheet gives you information about how to care for yourself after your procedure. Your health care provider may also give you more specific instructions. If you have problems or questions, contact your health care provider. What can I expect after the procedure? After the procedure, it is common to have:  Pain or soreness near the biopsy site.  Pink or cloudy urine for 24 hours after the procedure. Follow these instructions at home: Activity  Return to your normal activities as told by your health care provider. Ask your health care provider what activities are safe for you.  If you were given a sedative during the procedure, it can affect you for several hours. Do not drive or operate machinery until your health care provider says that it is safe.  Do not lift anything that is heavier than 10 lb (4.5 kg), or the limit that you are told, until your health care provider says that it is safe.  Avoid activities that take a lot of effort (are strenuous) until your health care provider approves. Most people will have to wait 2 weeks before returning to activities such as exercise or sex. General instructions   Take over-the-counter and prescription medicines only as told by your health care provider.  You may eat and drink after your procedure. Follow instructions from your health care provider about eating or drinking restrictions.  Check your biopsy site every day for signs of infection. Check for: ? More redness, swelling, or pain. ? Fluid or blood. ? Warmth. ? Pus or a bad smell.  Keep all follow-up visits as told by your health care provider. This is important. Contact a health care provider if:  You have more redness, swelling, or pain around your biopsy site.  You have fluid or blood coming from your biopsy site.  Your biopsy site feels warm to the touch.  You have pus or a bad smell coming from your biopsy site.  You have blood  in your urine more than 24 hours after your procedure.  You have a fever. Get help right away if:  Your urine is dark red or brown.  You cannot urinate.  It burns when you urinate.  You feel dizzy or light-headed.  You have severe pain in your abdomen or side. Summary  After the procedure, it is common to have pain or soreness at the biopsy site and pink or cloudy urine for the first 24 hours.  Check your biopsy site each day for signs of infection, such as more redness, swelling, or pain; fluid, blood, pus or a bad smell coming from the biopsy site; or the biopsy site feeling warm to touch.  Return to your normal activities as told by your health care provider. This information is not intended to replace advice given to you by your health care provider. Make sure you discuss any questions you have with your health care provider. Document Revised: 07/21/2019 Document Reviewed: 07/21/2019 Elsevier Patient Education  2020 [employer] Inc[employer]  

## 2020-09-11 DIAGNOSIS — R809 Proteinuria, unspecified: Secondary | ICD-10-CM | POA: Diagnosis not present

## 2020-09-11 DIAGNOSIS — N185 Chronic kidney disease, stage 5: Secondary | ICD-10-CM | POA: Diagnosis not present

## 2020-09-11 DIAGNOSIS — E1129 Type 2 diabetes mellitus with other diabetic kidney complication: Secondary | ICD-10-CM | POA: Diagnosis not present

## 2020-09-13 DIAGNOSIS — N041 Nephrotic syndrome with focal and segmental glomerular lesions: Secondary | ICD-10-CM | POA: Diagnosis not present

## 2020-09-13 DIAGNOSIS — E211 Secondary hyperparathyroidism, not elsewhere classified: Secondary | ICD-10-CM | POA: Diagnosis not present

## 2020-09-13 DIAGNOSIS — N185 Chronic kidney disease, stage 5: Secondary | ICD-10-CM | POA: Diagnosis not present

## 2020-09-13 DIAGNOSIS — N189 Chronic kidney disease, unspecified: Secondary | ICD-10-CM | POA: Diagnosis not present

## 2020-09-13 DIAGNOSIS — E875 Hyperkalemia: Secondary | ICD-10-CM | POA: Diagnosis not present

## 2020-09-13 DIAGNOSIS — D631 Anemia in chronic kidney disease: Secondary | ICD-10-CM | POA: Diagnosis not present

## 2020-09-13 DIAGNOSIS — R809 Proteinuria, unspecified: Secondary | ICD-10-CM | POA: Diagnosis not present

## 2020-09-13 DIAGNOSIS — I120 Hypertensive chronic kidney disease with stage 5 chronic kidney disease or end stage renal disease: Secondary | ICD-10-CM | POA: Diagnosis not present

## 2020-09-19 ENCOUNTER — Other Ambulatory Visit: Payer: Self-pay | Admitting: *Deleted

## 2020-09-19 DIAGNOSIS — N179 Acute kidney failure, unspecified: Secondary | ICD-10-CM

## 2020-09-20 ENCOUNTER — Ambulatory Visit (HOSPITAL_COMMUNITY)
Admission: RE | Admit: 2020-09-20 | Discharge: 2020-09-20 | Disposition: A | Discharge status: Home / Self Care | Payer: Medicare Other | Source: Ambulatory Visit | Attending: Vascular Surgery | Admitting: Vascular Surgery

## 2020-09-20 ENCOUNTER — Other Ambulatory Visit: Payer: Self-pay

## 2020-09-20 DIAGNOSIS — N179 Acute kidney failure, unspecified: Secondary | ICD-10-CM | POA: Diagnosis not present

## 2020-09-20 DIAGNOSIS — N189 Chronic kidney disease, unspecified: Secondary | ICD-10-CM | POA: Diagnosis not present

## 2020-09-21 ENCOUNTER — Other Ambulatory Visit (HOSPITAL_COMMUNITY): Payer: Medicare Other

## 2020-09-24 ENCOUNTER — Encounter: Payer: Medicare Other | Admitting: Vascular Surgery

## 2020-09-24 DIAGNOSIS — E119 Type 2 diabetes mellitus without complications: Secondary | ICD-10-CM | POA: Diagnosis present

## 2020-09-24 DIAGNOSIS — E7849 Other hyperlipidemia: Secondary | ICD-10-CM | POA: Diagnosis not present

## 2020-09-24 DIAGNOSIS — R1110 Vomiting, unspecified: Secondary | ICD-10-CM | POA: Diagnosis not present

## 2020-09-24 DIAGNOSIS — D631 Anemia in chronic kidney disease: Secondary | ICD-10-CM | POA: Diagnosis present

## 2020-09-24 DIAGNOSIS — R110 Nausea: Secondary | ICD-10-CM | POA: Diagnosis not present

## 2020-09-24 DIAGNOSIS — I1311 Hypertensive heart and chronic kidney disease without heart failure, with stage 5 chronic kidney disease, or end stage renal disease: Secondary | ICD-10-CM | POA: Diagnosis not present

## 2020-09-24 DIAGNOSIS — I4891 Unspecified atrial fibrillation: Secondary | ICD-10-CM | POA: Diagnosis not present

## 2020-09-24 DIAGNOSIS — I4892 Unspecified atrial flutter: Secondary | ICD-10-CM | POA: Diagnosis present

## 2020-09-24 DIAGNOSIS — K6389 Other specified diseases of intestine: Secondary | ICD-10-CM | POA: Diagnosis not present

## 2020-09-24 DIAGNOSIS — N189 Chronic kidney disease, unspecified: Secondary | ICD-10-CM | POA: Diagnosis not present

## 2020-09-24 DIAGNOSIS — E875 Hyperkalemia: Secondary | ICD-10-CM | POA: Diagnosis not present

## 2020-09-24 DIAGNOSIS — Z20822 Contact with and (suspected) exposure to covid-19: Secondary | ICD-10-CM | POA: Diagnosis present

## 2020-09-24 DIAGNOSIS — R197 Diarrhea, unspecified: Secondary | ICD-10-CM | POA: Diagnosis not present

## 2020-09-24 DIAGNOSIS — I120 Hypertensive chronic kidney disease with stage 5 chronic kidney disease or end stage renal disease: Secondary | ICD-10-CM | POA: Diagnosis present

## 2020-09-24 DIAGNOSIS — I480 Paroxysmal atrial fibrillation: Secondary | ICD-10-CM | POA: Diagnosis present

## 2020-09-24 DIAGNOSIS — I959 Hypotension, unspecified: Secondary | ICD-10-CM | POA: Diagnosis not present

## 2020-09-24 DIAGNOSIS — N186 End stage renal disease: Secondary | ICD-10-CM | POA: Diagnosis present

## 2020-09-24 DIAGNOSIS — E785 Hyperlipidemia, unspecified: Secondary | ICD-10-CM | POA: Diagnosis present

## 2020-09-24 DIAGNOSIS — Z79899 Other long term (current) drug therapy: Secondary | ICD-10-CM | POA: Diagnosis not present

## 2020-09-24 DIAGNOSIS — R42 Dizziness and giddiness: Secondary | ICD-10-CM | POA: Diagnosis not present

## 2020-09-24 DIAGNOSIS — E876 Hypokalemia: Secondary | ICD-10-CM | POA: Diagnosis not present

## 2020-09-24 DIAGNOSIS — N179 Acute kidney failure, unspecified: Secondary | ICD-10-CM | POA: Diagnosis present

## 2020-09-24 DIAGNOSIS — Z992 Dependence on renal dialysis: Secondary | ICD-10-CM | POA: Diagnosis not present

## 2020-09-24 DIAGNOSIS — I951 Orthostatic hypotension: Secondary | ICD-10-CM | POA: Diagnosis not present

## 2020-09-24 DIAGNOSIS — N178 Other acute kidney failure: Secondary | ICD-10-CM | POA: Diagnosis not present

## 2020-09-24 DIAGNOSIS — Z01810 Encounter for preprocedural cardiovascular examination: Secondary | ICD-10-CM | POA: Diagnosis not present

## 2020-09-24 DIAGNOSIS — Z9114 Patient's other noncompliance with medication regimen: Secondary | ICD-10-CM | POA: Diagnosis not present

## 2020-09-24 DIAGNOSIS — E872 Acidosis: Secondary | ICD-10-CM | POA: Diagnosis present

## 2020-09-24 DIAGNOSIS — R55 Syncope and collapse: Secondary | ICD-10-CM | POA: Diagnosis not present

## 2020-09-24 DIAGNOSIS — I482 Chronic atrial fibrillation, unspecified: Secondary | ICD-10-CM | POA: Diagnosis not present

## 2020-10-03 DIAGNOSIS — D631 Anemia in chronic kidney disease: Secondary | ICD-10-CM | POA: Diagnosis not present

## 2020-10-03 DIAGNOSIS — N186 End stage renal disease: Secondary | ICD-10-CM | POA: Diagnosis not present

## 2020-10-03 DIAGNOSIS — E119 Type 2 diabetes mellitus without complications: Secondary | ICD-10-CM | POA: Diagnosis not present

## 2020-10-03 DIAGNOSIS — Z23 Encounter for immunization: Secondary | ICD-10-CM | POA: Diagnosis not present

## 2020-10-03 DIAGNOSIS — Z79899 Other long term (current) drug therapy: Secondary | ICD-10-CM | POA: Diagnosis not present

## 2020-10-03 DIAGNOSIS — D509 Iron deficiency anemia, unspecified: Secondary | ICD-10-CM | POA: Diagnosis not present

## 2020-10-03 DIAGNOSIS — Z992 Dependence on renal dialysis: Secondary | ICD-10-CM | POA: Diagnosis not present

## 2020-10-03 DIAGNOSIS — N2581 Secondary hyperparathyroidism of renal origin: Secondary | ICD-10-CM | POA: Diagnosis not present

## 2020-10-03 DIAGNOSIS — E7800 Pure hypercholesterolemia, unspecified: Secondary | ICD-10-CM | POA: Diagnosis not present

## 2020-10-05 ENCOUNTER — Other Ambulatory Visit: Payer: Self-pay

## 2020-10-05 ENCOUNTER — Emergency Department (HOSPITAL_COMMUNITY)
Admission: EM | Admit: 2020-10-05 | Discharge: 2020-10-05 | Disposition: A | Discharge status: Home / Self Care | Payer: Medicare Other | Attending: Emergency Medicine | Admitting: Emergency Medicine

## 2020-10-05 ENCOUNTER — Encounter (HOSPITAL_COMMUNITY): Payer: Self-pay

## 2020-10-05 DIAGNOSIS — Z992 Dependence on renal dialysis: Secondary | ICD-10-CM | POA: Diagnosis not present

## 2020-10-05 DIAGNOSIS — R000 Tachycardia, unspecified: Secondary | ICD-10-CM | POA: Insufficient documentation

## 2020-10-05 DIAGNOSIS — I959 Hypotension, unspecified: Secondary | ICD-10-CM | POA: Diagnosis not present

## 2020-10-05 DIAGNOSIS — E1122 Type 2 diabetes mellitus with diabetic chronic kidney disease: Secondary | ICD-10-CM | POA: Insufficient documentation

## 2020-10-05 DIAGNOSIS — R0689 Other abnormalities of breathing: Secondary | ICD-10-CM | POA: Diagnosis not present

## 2020-10-05 DIAGNOSIS — Z7901 Long term (current) use of anticoagulants: Secondary | ICD-10-CM | POA: Insufficient documentation

## 2020-10-05 DIAGNOSIS — D631 Anemia in chronic kidney disease: Secondary | ICD-10-CM | POA: Diagnosis not present

## 2020-10-05 DIAGNOSIS — N186 End stage renal disease: Secondary | ICD-10-CM | POA: Diagnosis not present

## 2020-10-05 DIAGNOSIS — R0902 Hypoxemia: Secondary | ICD-10-CM | POA: Diagnosis not present

## 2020-10-05 DIAGNOSIS — Z79899 Other long term (current) drug therapy: Secondary | ICD-10-CM | POA: Diagnosis not present

## 2020-10-05 DIAGNOSIS — N185 Chronic kidney disease, stage 5: Secondary | ICD-10-CM | POA: Insufficient documentation

## 2020-10-05 DIAGNOSIS — Z23 Encounter for immunization: Secondary | ICD-10-CM | POA: Diagnosis not present

## 2020-10-05 DIAGNOSIS — I499 Cardiac arrhythmia, unspecified: Secondary | ICD-10-CM | POA: Diagnosis not present

## 2020-10-05 DIAGNOSIS — N2581 Secondary hyperparathyroidism of renal origin: Secondary | ICD-10-CM | POA: Diagnosis not present

## 2020-10-05 DIAGNOSIS — D509 Iron deficiency anemia, unspecified: Secondary | ICD-10-CM | POA: Diagnosis not present

## 2020-10-05 LAB — BASIC METABOLIC PANEL
Anion gap: 12 (ref 5–15)
BUN: 33 mg/dL — ABNORMAL HIGH (ref 8–23)
CO2: 25 mmol/L (ref 22–32)
Calcium: 7.1 mg/dL — ABNORMAL LOW (ref 8.9–10.3)
Chloride: 96 mmol/L — ABNORMAL LOW (ref 98–111)
Creatinine, Ser: 3.03 mg/dL — ABNORMAL HIGH (ref 0.61–1.24)
GFR, Estimated: 21 mL/min — ABNORMAL LOW (ref >60)
Glucose, Bld: 114 mg/dL — ABNORMAL HIGH (ref 70–99)
Potassium: 3.7 mmol/L (ref 3.5–5.1)
Sodium: 133 mmol/L — ABNORMAL LOW (ref 135–145)

## 2020-10-05 LAB — CBC WITH DIFFERENTIAL/PLATELET
Abs Immature Granulocytes: 0.04 10*3/uL (ref 0.00–0.07)
Basophils Absolute: 0 10*3/uL (ref 0.0–0.1)
Basophils Relative: 1 %
Eosinophils Absolute: 0 10*3/uL (ref 0.0–0.5)
Eosinophils Relative: 1 %
HCT: 25.8 % — ABNORMAL LOW (ref 39.0–52.0)
Hemoglobin: 8.5 g/dL — ABNORMAL LOW (ref 13.0–17.0)
Immature Granulocytes: 1 %
Lymphocytes Relative: 11 %
Lymphs Abs: 0.6 10*3/uL — ABNORMAL LOW (ref 0.7–4.0)
MCH: 31.1 pg (ref 26.0–34.0)
MCHC: 32.9 g/dL (ref 30.0–36.0)
MCV: 94.5 fL (ref 80.0–100.0)
Monocytes Absolute: 1.2 10*3/uL — ABNORMAL HIGH (ref 0.1–1.0)
Monocytes Relative: 22 %
Neutro Abs: 3.7 10*3/uL (ref 1.7–7.7)
Neutrophils Relative %: 64 %
Platelets: 391 10*3/uL (ref 150–400)
RBC: 2.73 MIL/uL — ABNORMAL LOW (ref 4.22–5.81)
RDW: 12.7 % (ref 11.5–15.5)
WBC: 5.6 10*3/uL (ref 4.0–10.5)
nRBC: 0 % (ref 0.0–0.2)

## 2020-10-05 LAB — CBG MONITORING, ED: Glucose-Capillary: 109 mg/dL — ABNORMAL HIGH (ref 70–99)

## 2020-10-05 MED ORDER — SODIUM CHLORIDE 0.9 % IV BOLUS
1000.0000 mL | Freq: Once | INTRAVENOUS | Status: AC
  Administered 2020-10-05: 1000 mL via INTRAVENOUS

## 2020-10-05 NOTE — ED Triage Notes (Addendum)
Pt recently started dialysis and has had 3 treatments. Pt was at dialysis when HR got up to 160' , now sinus rhythm. Ems reports pt was orthostatic with systolic dropping into 43K. Pt reports starting on new heart medication within the last 5 days

## 2020-10-05 NOTE — ED Provider Notes (Signed)
[facility] EMERGENCY DEPARTMENT Provider Note   CSN: [id number] Arrival date & time: 10/05/20  1716     History Chief Complaint  Patient presents with  . Tachycardia    [name] is a 69 y.o. male.  Patient states she started dialysis 2 weeks ago.  After dialysis today he had tachycardia.  And mildly weak  The history is provided by medical records. No language interpreter was used.  Weakness Severity:  Mild Onset quality:  Sudden Timing:  Intermittent Progression:  Waxing and waning Chronicity:  New Context: not alcohol use   Relieved by:  Nothing Worsened by:  Nothing Ineffective treatments:  None tried Associated symptoms: no abdominal pain, no chest pain, no cough, no diarrhea, no frequency, no headaches and no seizures        Past Medical History:  Diagnosis Date  . CKD (chronic kidney disease), stage V ([place])   . Diabetes mellitus without complication ([place])   . Diabetes mellitus, type 2 ([place])   . History of hemodialysis     Patient Active Problem List   Diagnosis Date Noted  . Small bowel obstruction ([place])   . Type 2 diabetes mellitus ([place]) 06/11/2018  . Peripheral neuropathy 06/11/2018  . Ankle syndesmosis disruption, right, initial encounter 06/11/2018  . AKI (acute kidney injury) ([place])   . Dehydration   . Open right ankle fracture 06/08/2018  . Fracture, ankle 06/08/2018  . Open displaced fracture of medial malleolus of tibia, type III 06/08/2018    Past Surgical History:  Procedure Laterality Date  . EXTERNAL FIXATION LEG Right 06/08/2018   Procedure: EXTERNAL FIXATION COMMINUTED FIBULA FRACTURE;  Surgeon: Netta Cedars, MD;  Location: [place];  Service: Orthopedics;  Laterality: Right;  . EXTERNAL FIXATION REMOVAL Right 06/11/2018   Procedure: REMOVAL EXTERNAL FIXATION LEG;  Surgeon: Shona Needles, MD;  Location: [place];  Service: Orthopedics;  Laterality: Right;  . I & D EXTREMITY Right 06/08/2018   Procedure: IRRIGATION AND DEBRIDEMENT OPEN TIBIA  FRACTURE;  Surgeon: Netta Cedars, MD;  Location: [place];  Service: Orthopedics;  Laterality: Right;  . I & D EXTREMITY Right 06/11/2018   Procedure: IRRIGATION AND DEBRIDEMENT EXTREMITY;  Surgeon: Shona Needles, MD;  Location: [place];  Service: Orthopedics;  Laterality: Right;  . NOSE SURGERY    . ORIF ANKLE FRACTURE Right 06/08/2018   Procedure: OPEN REDUCTION INTERNAL FIXATION (ORIF) MEDIAL MALLEOLUS;  Surgeon: Netta Cedars, MD;  Location: [place];  Service: Orthopedics;  Laterality: Right;  . ORIF ANKLE FRACTURE Right 06/11/2018   Procedure: OPEN REDUCTION INTERNAL FIXATION (ORIF) ANKLE FRACTURE;  Surgeon: Shona Needles, MD;  Location: [place];  Service: Orthopedics;  Laterality: Right;  . TIBIA DEBRIDEMENT Right 06/08/2018       No family history on file.  Social History   Tobacco Use  . Smoking status: Never Smoker  . Smokeless tobacco: Never Used  Vaping Use  . Vaping Use: Never used  Substance Use Topics  . Alcohol use: Yes    Comment: occasional  . Drug use: Never    Home Medications Prior to Admission medications   Medication Sig Start Date End Date Taking? Authorizing Provider  amiodarone (PACERONE) 200 MG tablet Take 200 mg by mouth daily. 10/01/20  Yes [provider]  ELIQUIS 2.5 MG TABS tablet Take 2.5 mg by mouth 2 (two) times daily. 10/01/20  Yes [provider]  midodrine (PROAMATINE) 2.5 MG tablet Take 1 tablet by mouth 3 (three) times daily. 10/02/20  Yes [provider]  simvastatin (ZOCOR) 20 MG tablet Take 20 mg by mouth every evening. 04/30/18  Yes [provider]    Allergies    Patient has no known allergies.  Review of Systems   Review of Systems  Constitutional: Negative for appetite change and fatigue.  HENT: Negative for congestion, ear discharge and sinus pressure.   Eyes: Negative for discharge.  Respiratory: Negative for cough.   Cardiovascular: Negative for chest pain.  Gastrointestinal: Negative for abdominal  pain and diarrhea.  Genitourinary: Negative for frequency and hematuria.  Musculoskeletal: Negative for back pain.  Skin: Negative for rash.  Neurological: Positive for weakness. Negative for seizures and headaches.  Psychiatric/Behavioral: Negative for hallucinations.    Physical Exam Updated Vital Signs BP (!) 103/56   Pulse 88   Temp 98 F (36.7 C)   Resp 14   Ht 6\' 2"  (1.88 m)   Wt 68 kg   SpO2 95%   BMI 19.26 kg/m   Physical Exam Vitals and nursing note reviewed.  Constitutional:      Appearance: He is well-developed.  HENT:     Head: Normocephalic.     Nose: Nose normal.  Eyes:     General: No scleral icterus.    Conjunctiva/sclera: Conjunctivae normal.  Neck:     Thyroid: No thyromegaly.  Cardiovascular:     Rate and Rhythm: Normal rate and regular rhythm.     Heart sounds: No murmur heard.  No friction rub. No gallop.   Pulmonary:     Breath sounds: No stridor. No wheezing or rales.  Chest:     Chest wall: No tenderness.  Abdominal:     General: There is no distension.     Tenderness: There is no abdominal tenderness. There is no rebound.  Musculoskeletal:        General: Normal range of motion.     Cervical back: Neck supple.  Lymphadenopathy:     Cervical: No cervical adenopathy.  Skin:    Findings: No erythema or rash.  Neurological:     Mental Status: He is alert and oriented to person, place, and time.     Motor: No abnormal muscle tone.     Coordination: Coordination normal.  Psychiatric:        Behavior: Behavior normal.     ED Results / Procedures / Treatments   Labs (all labs ordered are listed, but only abnormal results are displayed) Labs Reviewed  CBC WITH DIFFERENTIAL/PLATELET - Abnormal; Notable for the following components:      Result Value   RBC 2.73 (*)    Hemoglobin 8.5 (*)    HCT 25.8 (*)    Lymphs Abs 0.6 (*)    Monocytes Absolute 1.2 (*)    All other components within normal limits  BASIC METABOLIC PANEL - Abnormal;  Notable for the following components:   Sodium 133 (*)    Chloride 96 (*)    Glucose, Bld 114 (*)    BUN 33 (*)    Creatinine, Ser 3.03 (*)    Calcium 7.1 (*)    GFR, Estimated 21 (*)    All other components within normal limits  CBG MONITORING, ED - Abnormal; Notable for the following components:   Glucose-Capillary 109 (*)    All other components within normal limits    EKG None  Radiology No results found.  Procedures Procedures (including critical care time)  Medications Ordered in ED Medications  sodium chloride 0.9 % bolus 1,000  mL (0 mLs Intravenous Stopped 10/05/20 2137)  sodium chloride 0.9 % bolus 1,000 mL (0 mLs Intravenous Stopped 10/05/20 2252)    ED Course  I have reviewed the triage vital signs and the nursing notes.  Pertinent labs & imaging results that were available during my care of the patient were reviewed by me and considered in my medical decision making (see chart for details).    MDM Rules/Calculators/A&P                          Patient with dehydration and orthostasis most likely from dialysis.  He was given 2 L of fluids.  Patient states he does not feel dizzy or weak standing now.  Patient does show some orthostasis still.  Patient prefers to be discharged home and hydrate on his own.  Patient was instructed to go to dialysis on Monday as planned and to see his doctor Tuesday as planned.  If he has any problems over the weekend he is to return. Final Clinical Impression(s) / ED Diagnoses Final diagnoses:  Tachycardia    Rx / DC Orders ED Discharge Orders    None       Milton Ferguson, MD 10/07/20 1002

## 2020-10-05 NOTE — Discharge Instructions (Addendum)
Rest over the weekend.  Drink plenty of fluids.  Follow-up Tuesday with your doctor as planned.  When you have dialysis Monday let them know that your blood pressure dropped and they may take less fluid off this time.

## 2020-10-05 NOTE — ED Notes (Signed)
Pt almost fell into bed trying to lay down during standing portion of orthostatics. Pt stated he was very dizzy. Standing BP was 46/31, MD and RN made aware.

## 2020-10-08 DIAGNOSIS — D509 Iron deficiency anemia, unspecified: Secondary | ICD-10-CM | POA: Diagnosis not present

## 2020-10-08 DIAGNOSIS — Z23 Encounter for immunization: Secondary | ICD-10-CM | POA: Diagnosis not present

## 2020-10-08 DIAGNOSIS — N2581 Secondary hyperparathyroidism of renal origin: Secondary | ICD-10-CM | POA: Diagnosis not present

## 2020-10-08 DIAGNOSIS — Z992 Dependence on renal dialysis: Secondary | ICD-10-CM | POA: Diagnosis not present

## 2020-10-08 DIAGNOSIS — D631 Anemia in chronic kidney disease: Secondary | ICD-10-CM | POA: Diagnosis not present

## 2020-10-08 DIAGNOSIS — N186 End stage renal disease: Secondary | ICD-10-CM | POA: Diagnosis not present

## 2020-10-09 DIAGNOSIS — E1121 Type 2 diabetes mellitus with diabetic nephropathy: Secondary | ICD-10-CM | POA: Diagnosis not present

## 2020-10-09 DIAGNOSIS — E0580 Other thyrotoxicosis without thyrotoxic crisis or storm: Secondary | ICD-10-CM | POA: Diagnosis not present

## 2020-10-09 DIAGNOSIS — Y841 Kidney dialysis as the cause of abnormal reaction of the patient, or of later complication, without mention of misadventure at the time of the procedure: Secondary | ICD-10-CM | POA: Diagnosis not present

## 2020-10-09 DIAGNOSIS — R197 Diarrhea, unspecified: Secondary | ICD-10-CM | POA: Diagnosis not present

## 2020-10-09 DIAGNOSIS — E7849 Other hyperlipidemia: Secondary | ICD-10-CM | POA: Diagnosis not present

## 2020-10-09 DIAGNOSIS — I10 Essential (primary) hypertension: Secondary | ICD-10-CM | POA: Diagnosis not present

## 2020-10-09 DIAGNOSIS — M818 Other osteoporosis without current pathological fracture: Secondary | ICD-10-CM | POA: Diagnosis not present

## 2020-10-09 DIAGNOSIS — N184 Chronic kidney disease, stage 4 (severe): Secondary | ICD-10-CM | POA: Diagnosis not present

## 2020-10-09 DIAGNOSIS — I952 Hypotension due to drugs: Secondary | ICD-10-CM | POA: Diagnosis not present

## 2020-10-10 DIAGNOSIS — Z992 Dependence on renal dialysis: Secondary | ICD-10-CM | POA: Diagnosis not present

## 2020-10-10 DIAGNOSIS — D631 Anemia in chronic kidney disease: Secondary | ICD-10-CM | POA: Diagnosis not present

## 2020-10-10 DIAGNOSIS — Z23 Encounter for immunization: Secondary | ICD-10-CM | POA: Diagnosis not present

## 2020-10-10 DIAGNOSIS — N186 End stage renal disease: Secondary | ICD-10-CM | POA: Diagnosis not present

## 2020-10-10 DIAGNOSIS — D509 Iron deficiency anemia, unspecified: Secondary | ICD-10-CM | POA: Diagnosis not present

## 2020-10-10 DIAGNOSIS — N2581 Secondary hyperparathyroidism of renal origin: Secondary | ICD-10-CM | POA: Diagnosis not present

## 2020-10-12 ENCOUNTER — Emergency Department (HOSPITAL_COMMUNITY): Payer: Medicare Other

## 2020-10-12 ENCOUNTER — Encounter (HOSPITAL_COMMUNITY): Payer: Self-pay

## 2020-10-12 ENCOUNTER — Inpatient Hospital Stay (HOSPITAL_COMMUNITY)
Admission: EM | Admit: 2020-10-12 | Discharge: 2020-11-01 | DRG: 640 | Disposition: A | Discharge status: Skilled Nursing Facility | Payer: Medicare Other | Attending: Internal Medicine | Admitting: Internal Medicine

## 2020-10-12 ENCOUNTER — Other Ambulatory Visit: Payer: Self-pay

## 2020-10-12 DIAGNOSIS — D62 Acute posthemorrhagic anemia: Secondary | ICD-10-CM | POA: Diagnosis present

## 2020-10-12 DIAGNOSIS — L89152 Pressure ulcer of sacral region, stage 2: Secondary | ICD-10-CM | POA: Diagnosis present

## 2020-10-12 DIAGNOSIS — L8990 Pressure ulcer of unspecified site, unspecified stage: Secondary | ICD-10-CM | POA: Insufficient documentation

## 2020-10-12 DIAGNOSIS — K633 Ulcer of intestine: Secondary | ICD-10-CM | POA: Diagnosis not present

## 2020-10-12 DIAGNOSIS — M898X9 Other specified disorders of bone, unspecified site: Secondary | ICD-10-CM | POA: Diagnosis present

## 2020-10-12 DIAGNOSIS — I951 Orthostatic hypotension: Secondary | ICD-10-CM | POA: Diagnosis not present

## 2020-10-12 DIAGNOSIS — D75839 Thrombocytosis, unspecified: Secondary | ICD-10-CM | POA: Diagnosis present

## 2020-10-12 DIAGNOSIS — K529 Noninfective gastroenteritis and colitis, unspecified: Secondary | ICD-10-CM | POA: Diagnosis present

## 2020-10-12 DIAGNOSIS — E861 Hypovolemia: Secondary | ICD-10-CM | POA: Diagnosis present

## 2020-10-12 DIAGNOSIS — E1122 Type 2 diabetes mellitus with diabetic chronic kidney disease: Secondary | ICD-10-CM | POA: Diagnosis present

## 2020-10-12 DIAGNOSIS — Z992 Dependence on renal dialysis: Secondary | ICD-10-CM

## 2020-10-12 DIAGNOSIS — D649 Anemia, unspecified: Secondary | ICD-10-CM | POA: Diagnosis not present

## 2020-10-12 DIAGNOSIS — I132 Hypertensive heart and chronic kidney disease with heart failure and with stage 5 chronic kidney disease, or end stage renal disease: Secondary | ICD-10-CM | POA: Diagnosis not present

## 2020-10-12 DIAGNOSIS — E860 Dehydration: Principal | ICD-10-CM | POA: Diagnosis present

## 2020-10-12 DIAGNOSIS — E46 Unspecified protein-calorie malnutrition: Secondary | ICD-10-CM | POA: Diagnosis present

## 2020-10-12 DIAGNOSIS — E1142 Type 2 diabetes mellitus with diabetic polyneuropathy: Secondary | ICD-10-CM | POA: Diagnosis present

## 2020-10-12 DIAGNOSIS — R531 Weakness: Secondary | ICD-10-CM | POA: Diagnosis not present

## 2020-10-12 DIAGNOSIS — R55 Syncope and collapse: Secondary | ICD-10-CM | POA: Diagnosis not present

## 2020-10-12 DIAGNOSIS — R001 Bradycardia, unspecified: Secondary | ICD-10-CM | POA: Diagnosis not present

## 2020-10-12 DIAGNOSIS — I4891 Unspecified atrial fibrillation: Secondary | ICD-10-CM

## 2020-10-12 DIAGNOSIS — J9811 Atelectasis: Secondary | ICD-10-CM | POA: Diagnosis not present

## 2020-10-12 DIAGNOSIS — J90 Pleural effusion, not elsewhere classified: Secondary | ICD-10-CM | POA: Diagnosis not present

## 2020-10-12 DIAGNOSIS — R197 Diarrhea, unspecified: Secondary | ICD-10-CM

## 2020-10-12 DIAGNOSIS — E785 Hyperlipidemia, unspecified: Secondary | ICD-10-CM | POA: Diagnosis present

## 2020-10-12 DIAGNOSIS — N186 End stage renal disease: Secondary | ICD-10-CM | POA: Diagnosis not present

## 2020-10-12 DIAGNOSIS — I480 Paroxysmal atrial fibrillation: Secondary | ICD-10-CM | POA: Diagnosis not present

## 2020-10-12 DIAGNOSIS — E871 Hypo-osmolality and hyponatremia: Secondary | ICD-10-CM | POA: Diagnosis not present

## 2020-10-12 DIAGNOSIS — K625 Hemorrhage of anus and rectum: Secondary | ICD-10-CM

## 2020-10-12 DIAGNOSIS — A09 Infectious gastroenteritis and colitis, unspecified: Secondary | ICD-10-CM | POA: Diagnosis present

## 2020-10-12 DIAGNOSIS — N200 Calculus of kidney: Secondary | ICD-10-CM | POA: Diagnosis not present

## 2020-10-12 DIAGNOSIS — Z6820 Body mass index (BMI) 20.0-20.9, adult: Secondary | ICD-10-CM

## 2020-10-12 DIAGNOSIS — Z20822 Contact with and (suspected) exposure to covid-19: Secondary | ICD-10-CM | POA: Diagnosis present

## 2020-10-12 DIAGNOSIS — Z79899 Other long term (current) drug therapy: Secondary | ICD-10-CM

## 2020-10-12 DIAGNOSIS — E876 Hypokalemia: Secondary | ICD-10-CM | POA: Diagnosis not present

## 2020-10-12 DIAGNOSIS — K560 Paralytic ileus: Secondary | ICD-10-CM | POA: Diagnosis not present

## 2020-10-12 DIAGNOSIS — K8020 Calculus of gallbladder without cholecystitis without obstruction: Secondary | ICD-10-CM | POA: Diagnosis not present

## 2020-10-12 DIAGNOSIS — R634 Abnormal weight loss: Secondary | ICD-10-CM | POA: Diagnosis not present

## 2020-10-12 DIAGNOSIS — R140 Abdominal distension (gaseous): Secondary | ICD-10-CM | POA: Diagnosis not present

## 2020-10-12 DIAGNOSIS — Z8249 Family history of ischemic heart disease and other diseases of the circulatory system: Secondary | ICD-10-CM

## 2020-10-12 DIAGNOSIS — R0689 Other abnormalities of breathing: Secondary | ICD-10-CM | POA: Diagnosis not present

## 2020-10-12 DIAGNOSIS — I700 Atherosclerosis of aorta: Secondary | ICD-10-CM | POA: Diagnosis present

## 2020-10-12 DIAGNOSIS — Z7901 Long term (current) use of anticoagulants: Secondary | ICD-10-CM

## 2020-10-12 DIAGNOSIS — I953 Hypotension of hemodialysis: Secondary | ICD-10-CM | POA: Diagnosis not present

## 2020-10-12 DIAGNOSIS — D631 Anemia in chronic kidney disease: Secondary | ICD-10-CM | POA: Diagnosis present

## 2020-10-12 DIAGNOSIS — R112 Nausea with vomiting, unspecified: Secondary | ICD-10-CM | POA: Diagnosis not present

## 2020-10-12 DIAGNOSIS — R0902 Hypoxemia: Secondary | ICD-10-CM

## 2020-10-12 HISTORY — DX: Orthostatic hypotension: I95.1

## 2020-10-12 HISTORY — DX: End stage renal disease: N18.6

## 2020-10-12 HISTORY — DX: Paroxysmal atrial fibrillation: I48.0

## 2020-10-12 HISTORY — DX: Gastrointestinal hemorrhage, unspecified: K92.2

## 2020-10-12 LAB — BASIC METABOLIC PANEL
Anion gap: 11 (ref 5–15)
BUN: 41 mg/dL — ABNORMAL HIGH (ref 8–23)
CO2: 23 mmol/L (ref 22–32)
Calcium: 7.2 mg/dL — ABNORMAL LOW (ref 8.9–10.3)
Chloride: 97 mmol/L — ABNORMAL LOW (ref 98–111)
Creatinine, Ser: 5.49 mg/dL — ABNORMAL HIGH (ref 0.61–1.24)
GFR, Estimated: 10 mL/min — ABNORMAL LOW (ref >60)
Glucose, Bld: 152 mg/dL — ABNORMAL HIGH (ref 70–99)
Potassium: 3.4 mmol/L — ABNORMAL LOW (ref 3.5–5.1)
Sodium: 131 mmol/L — ABNORMAL LOW (ref 135–145)

## 2020-10-12 LAB — URINALYSIS, ROUTINE W REFLEX MICROSCOPIC
Bilirubin Urine: NEGATIVE
Glucose, UA: >=500 mg/dL — AB
Hgb urine dipstick: NEGATIVE
Ketones, ur: NEGATIVE mg/dL
Leukocytes,Ua: NEGATIVE
Nitrite: NEGATIVE
Protein, ur: 100 mg/dL — AB
Specific Gravity, Urine: 1.008 (ref 1.005–1.030)
pH: 8 (ref 5.0–8.0)

## 2020-10-12 LAB — CBC
HCT: 27.3 % — ABNORMAL LOW (ref 39.0–52.0)
Hemoglobin: 8.2 g/dL — ABNORMAL LOW (ref 13.0–17.0)
MCH: 30.8 pg (ref 26.0–34.0)
MCHC: 30 g/dL (ref 30.0–36.0)
MCV: 102.6 fL — ABNORMAL HIGH (ref 80.0–100.0)
Platelets: 577 10*3/uL — ABNORMAL HIGH (ref 150–400)
RBC: 2.66 MIL/uL — ABNORMAL LOW (ref 4.22–5.81)
RDW: 13.3 % (ref 11.5–15.5)
WBC: 8.3 10*3/uL (ref 4.0–10.5)
nRBC: 0 % (ref 0.0–0.2)

## 2020-10-12 LAB — TSH: TSH: 1.912 u[IU]/mL (ref 0.350–4.500)

## 2020-10-12 LAB — BLOOD GAS, ARTERIAL
Acid-base deficit: 1.6 mmol/L (ref 0.0–2.0)
Bicarbonate: 23.1 mmol/L (ref 20.0–28.0)
FIO2: 21
O2 Saturation: 93.1 %
Patient temperature: 36.9
pCO2 arterial: 34.3 mmHg (ref 32.0–48.0)
pH, Arterial: 7.425 (ref 7.350–7.450)
pO2, Arterial: 84.1 mmHg (ref 83.0–108.0)

## 2020-10-12 LAB — HEPATIC FUNCTION PANEL
ALT: 15 U/L (ref 0–44)
AST: 14 U/L — ABNORMAL LOW (ref 15–41)
Albumin: 1.9 g/dL — ABNORMAL LOW (ref 3.5–5.0)
Alkaline Phosphatase: 74 U/L (ref 38–126)
Bilirubin, Direct: 0.2 mg/dL (ref 0.0–0.2)
Indirect Bilirubin: 0.4 mg/dL (ref 0.3–0.9)
Total Bilirubin: 0.6 mg/dL (ref 0.3–1.2)
Total Protein: 5.1 g/dL — ABNORMAL LOW (ref 6.5–8.1)

## 2020-10-12 LAB — LIPASE, BLOOD: Lipase: 20 U/L (ref 11–51)

## 2020-10-12 LAB — RESPIRATORY PANEL BY RT PCR (FLU A&B, COVID)
Influenza A by PCR: NEGATIVE
Influenza B by PCR: NEGATIVE
SARS Coronavirus 2 by RT PCR: NEGATIVE

## 2020-10-12 LAB — CBG MONITORING, ED: Glucose-Capillary: 139 mg/dL — ABNORMAL HIGH (ref 70–99)

## 2020-10-12 MED ORDER — IOHEXOL 9 MG/ML PO SOLN
ORAL | Status: AC
  Filled 2020-10-12: qty 1000

## 2020-10-12 MED ORDER — SODIUM CHLORIDE 0.9 % IV BOLUS
500.0000 mL | Freq: Once | INTRAVENOUS | Status: AC
  Administered 2020-10-12: 500 mL via INTRAVENOUS

## 2020-10-12 MED ORDER — SIMVASTATIN 20 MG PO TABS
20.0000 mg | ORAL_TABLET | Freq: Every evening | ORAL | Status: DC
  Administered 2020-10-12 – 2020-11-01 (×21): 20 mg via ORAL
  Filled 2020-10-12 (×9): qty 1
  Filled 2020-10-12: qty 2
  Filled 2020-10-12 (×13): qty 1

## 2020-10-12 MED ORDER — AMIODARONE HCL 200 MG PO TABS
200.0000 mg | ORAL_TABLET | Freq: Every day | ORAL | Status: DC
  Administered 2020-10-14 – 2020-11-01 (×18): 200 mg via ORAL
  Filled 2020-10-12 (×19): qty 1

## 2020-10-12 MED ORDER — SODIUM CHLORIDE 0.9% FLUSH
3.0000 mL | Freq: Two times a day (BID) | INTRAVENOUS | Status: DC
  Administered 2020-10-15 – 2020-10-22 (×6): 3 mL via INTRAVENOUS

## 2020-10-12 MED ORDER — MIDODRINE HCL 5 MG PO TABS
5.0000 mg | ORAL_TABLET | Freq: Three times a day (TID) | ORAL | Status: DC
  Administered 2020-10-13 – 2020-10-19 (×17): 5 mg via ORAL
  Filled 2020-10-12 (×25): qty 1

## 2020-10-12 MED ORDER — APIXABAN 2.5 MG PO TABS
2.5000 mg | ORAL_TABLET | Freq: Two times a day (BID) | ORAL | Status: DC
  Administered 2020-10-12 – 2020-10-15 (×5): 2.5 mg via ORAL
  Filled 2020-10-12 (×5): qty 1

## 2020-10-12 MED ORDER — DIPHENOXYLATE-ATROPINE 2.5-0.025 MG PO TABS
1.0000 | ORAL_TABLET | Freq: Four times a day (QID) | ORAL | Status: DC | PRN
  Administered 2020-10-25: 1 via ORAL
  Filled 2020-10-12: qty 1

## 2020-10-12 NOTE — ED Provider Notes (Signed)
  Face-to-face evaluation   History: He presents for evaluation of lightheadedness which prevented him from going to dialysis today.  Evaluated for same, 4 days ago in the ED.  At that time he was treated with IV fluids.  He dialyzed twice this week but did not dialyze today.  Physical exam: Alert, uncomfortable.  He is tachypneic.  No respiratory distress.  Dialysis catheter, right upper chest wall, area is nontender to palpation without swelling  Medical screening examination/treatment/procedure(s) were conducted as a shared visit with non-physician practitioner(s) and myself.  I personally evaluated the patient during the encounter    Daleen Bo, MD 10/12/20 2001

## 2020-10-12 NOTE — H&P (Addendum)
TRH H&P   Patient Demographics:    [name], is a 69 y.o. male  MRN: [id number]   DOB - 08/04/1951  Admit Date - 10/12/2020  Outpatient Primary MD for the patient is Patient, No Pcp Per  Referring MD/NP/PA: PA Margaux   Outpatient Specialists: renal Dr Theador Hawthorne    Patient coming from: home  Chief Complaint  Patient presents with  . Near Syncope      HPI:    [name]  is a 69 y.o. male, with past medical history of diabetes mellitus, CKD stage V, recent hospitalization at [facility] for which he started hemodialysis for ESRD on MWF schedule, and diagnosed with A. fib when he started on amiodarone, metoprolol and Eliquis, patient was sent from hemodialysis center for near syncope and orthostasis, patient reports he was discharged last week from [place], he is on hemodialysis center since Monday, he does report syncope on Monday after dialysis, as well he does report orthostasis and near syncope for last couple days which he was started on midodrine by his nephrologist, report this morning prior to dialysis he felt dizzy and lightheaded where he was hypotensive for which they sent him to ED, patient report he is still making good amount of urine daily, reports his weight 2 weeks ago was 29.2 kg, and during most dialysis session was at 76, as well he does report diarrhea x2 weeks, he denies fever, chills or abdominal pain, he does report episodes of vomiting on Wednesday, but no recurrence, overall he reports good oral intake and fluid consumption. - in ED patient was noted to be orthostatic127/67 supine, 71/48 standing with no improvement despite receiving 500 cc x 2 bolus, Significant for potassium of 3.2, sodium of 131, nine 8.2, CT abdomen pelvis significant for wall thickening in the rectal area, with redundant dilated sigmoid colon, but no overt volvulus, so [facility] hospitalist  consulted to admit.    Review of systems:    In addition to the HPI above,  No Fever-chills, report near syncope. No Headache, No changes with Vision or hearing, No problems swallowing food or Liquids, No Chest pain, Cough or Shortness of Breath, No Abdominal pain, he does report vomiting 2 days ago, he does report diarrhea for last 2 weeks which has been improving . No Blood in stool or Urine, No dysuria, No new skin rashes or bruises, No new joints pains-aches,  No new weakness, tingling, numbness in any extremity, No recent weight gain or loss, No polyuria, polydypsia or polyphagia, No significant Mental Stressors.  A full 10 point Review of Systems was done, except as stated above, all other Review of Systems were negative.   With Past History of the following :    Past Medical History:  Diagnosis Date  . CKD (chronic kidney disease), stage V ([place])   . Diabetes mellitus without complication ([place])   . Diabetes mellitus,  type 2 ([place])   . History of hemodialysis       Past Surgical History:  Procedure Laterality Date  . EXTERNAL FIXATION LEG Right 06/08/2018   Procedure: EXTERNAL FIXATION COMMINUTED FIBULA FRACTURE;  Surgeon: Netta Cedars, MD;  Location: [place];  Service: Orthopedics;  Laterality: Right;  . EXTERNAL FIXATION REMOVAL Right 06/11/2018   Procedure: REMOVAL EXTERNAL FIXATION LEG;  Surgeon: Shona Needles, MD;  Location: [place];  Service: Orthopedics;  Laterality: Right;  . I & D EXTREMITY Right 06/08/2018   Procedure: IRRIGATION AND DEBRIDEMENT OPEN TIBIA FRACTURE;  Surgeon: Netta Cedars, MD;  Location: [place];  Service: Orthopedics;  Laterality: Right;  . I & D EXTREMITY Right 06/11/2018   Procedure: IRRIGATION AND DEBRIDEMENT EXTREMITY;  Surgeon: Shona Needles, MD;  Location: [place];  Service: Orthopedics;  Laterality: Right;  . NOSE SURGERY    . ORIF ANKLE FRACTURE Right 06/08/2018   Procedure: OPEN REDUCTION INTERNAL FIXATION (ORIF) MEDIAL MALLEOLUS;  Surgeon:  Netta Cedars, MD;  Location: [place];  Service: Orthopedics;  Laterality: Right;  . ORIF ANKLE FRACTURE Right 06/11/2018   Procedure: OPEN REDUCTION INTERNAL FIXATION (ORIF) ANKLE FRACTURE;  Surgeon: Shona Needles, MD;  Location: [place];  Service: Orthopedics;  Laterality: Right;  . TIBIA DEBRIDEMENT Right 06/08/2018      Social History:     Social History   Tobacco Use  . Smoking status: Never Smoker  . Smokeless tobacco: Never Used  Substance Use Topics  . Alcohol use: Yes    Comment: occasional       Family History :   Family history was reviewed, nonpertinent   Home Medications:   Prior to Admission medications   Medication Sig Start Date End Date Taking? Authorizing Provider  amiodarone (PACERONE) 200 MG tablet Take 200 mg by mouth daily. 10/01/20  Yes [provider]  ELIQUIS 2.5 MG TABS tablet Take 2.5 mg by mouth 2 (two) times daily. 10/01/20  Yes [provider]  metoprolol succinate (TOPROL-XL) 25 MG 24 hr tablet Take 25 mg by mouth daily. 10/09/20  Yes [provider]  midodrine (PROAMATINE) 2.5 MG tablet Take 1 tablet by mouth 3 (three) times daily. 10/02/20  Yes [provider]  ondansetron (ZOFRAN) 4 MG tablet Take by mouth. 10/09/20  Yes [provider]  simvastatin (ZOCOR) 20 MG tablet Take 20 mg by mouth every evening. 04/30/18  Yes [provider]     Allergies:    No Known Allergies   Physical Exam:   Vitals  Blood pressure 117/65, pulse 77, temperature 98.5 F (36.9 C), temperature source Oral, resp. rate 16, height 6\' 2"  (1.88 m), weight 72.6 kg, SpO2 95 %.   1. General frail elderly male, laying in bed, no apparent distress  2. Normal affect and insight, Not Suicidal or Homicidal, Awake Alert, Oriented X 3.  3. No F.N deficits, ALL C.Nerves Intact, Strength 5/5 all 4 extremities, Sensation intact all 4 extremities, Plantars down going.  4. Ears and Eyes appear Normal, Conjunctivae clear,  PERRLA. Moist Oral Mucosa.  5. Supple Neck, No JVD, No cervical lymphadenopathy appriciated, No Carotid Bruits.  6. Symmetrical Chest wall movement, Good air movement bilaterally, CTAB.  7. RRR, No Gallops, Rubs or Murmurs, No Parasternal Heave.  8. Positive Bowel Sounds, Abdomen Soft, No tenderness, No organomegaly appriciated,No rebound -guarding or rigidity.  9.  No Cyanosis, delayed  Skin Turgor, No Skin Rash or Bruise.  10. Good muscle tone,  joints appear  normal , no effusions, Normal ROM.  11. No Palpable Lymph Nodes in Neck or Axillae     Data Review:    CBC Recent Labs  Lab 10/12/20 1344  WBC 8.3  HGB 8.2*  HCT 27.3*  PLT 577*  MCV 102.6*  MCH 30.8  MCHC 30.0  RDW 13.3   ------------------------------------------------------------------------------------------------------------------  Chemistries  Recent Labs  Lab 10/12/20 1344 10/12/20 1348  NA 131*  --   K 3.4*  --   CL 97*  --   CO2 23  --   GLUCOSE 152*  --   BUN 41*  --   CREATININE 5.49*  --   CALCIUM 7.2*  --   AST  --  14*  ALT  --  15  ALKPHOS  --  74  BILITOT  --  0.6   ------------------------------------------------------------------------------------------------------------------ estimated creatinine clearance is 12.9 mL/min (A) (by C-G formula based on SCr of 5.49 mg/dL (H)). ------------------------------------------------------------------------------------------------------------------ No results for input(s): TSH, T4TOTAL, T3FREE, THYROIDAB in the last 72 hours.  Invalid input(s): FREET3  Coagulation profile No results for input(s): INR, PROTIME in the last 168 hours. ------------------------------------------------------------------------------------------------------------------- No results for input(s): DDIMER in the last 72 hours. -------------------------------------------------------------------------------------------------------------------  Cardiac Enzymes No  results for input(s): CKMB, TROPONINI, MYOGLOBIN in the last 168 hours.  Invalid input(s): CK ------------------------------------------------------------------------------------------------------------------ No results found for: BNP   ---------------------------------------------------------------------------------------------------------------  Urinalysis    Component Value Date/Time   COLORURINE YELLOW 10/12/2020 1622   APPEARANCEUR CLEAR 10/12/2020 1622   LABSPEC 1.008 10/12/2020 1622   PHURINE 8.0 10/12/2020 1622   GLUCOSEU >=500 (A) 10/12/2020 1622   HGBUR NEGATIVE 10/12/2020 1622   BILIRUBINUR NEGATIVE 10/12/2020 1622   KETONESUR NEGATIVE 10/12/2020 1622   PROTEINUR 100 (A) 10/12/2020 1622   NITRITE NEGATIVE 10/12/2020 1622   LEUKOCYTESUR NEGATIVE 10/12/2020 1622    ----------------------------------------------------------------------------------------------------------------   Imaging Results:    CT Abdomen Pelvis Wo Contrast  Result Date: 10/12/2020 CLINICAL DATA:  15 pound weight loss in 2 weeks. Nausea and vomiting. EXAM: CT ABDOMEN AND PELVIS WITHOUT CONTRAST TECHNIQUE: Multidetector CT imaging of the abdomen and pelvis was performed following the standard protocol without IV contrast. COMPARISON:  06/13/2018 FINDINGS: Lower chest: Small bilateral pleural effusions. Contrast medium in the distal esophagus suggesting dysmotility or reflux. Low-density blood pool suggests anemia. Hepatobiliary: Dependent gallstones in the gallbladder. Trace perihepatic ascites. Pancreas: Punctate calcifications in the pancreas likely reflecting chronic calcific pancreatitis. Spleen: Unremarkable Adrenals/Urinary Tract: There about 8 nonobstructive right renal calculi measuring up to 0.5 cm in diameter. There are about 13 nonobstructive left renal calculi measuring up to 0.6 cm in diameter. No hydronephrosis, hydroureter, or ureteral calculus identified. Adrenal glands unremarkable.  Stomach/Bowel: Rectal wall thickening noted with redundant dilated sigmoid colon extending up into the upper abdomen, but without overt volvulus. There is wall thickening in the descending colon. Gas-filled dilated transverse colon. Mild segmental wall thickening in the ascending colon. There is edema in the sigmoid colon mesentery and to a lesser extent in the central bowel mesentery normal appendix. No pneumatosis. Vascular/Lymphatic: Aortoiliac atherosclerotic vascular disease. Reproductive: Mild prostatomegaly. Other: Presacral edema. Mild scattered ascites. Mesenteric edema especially along the sigmoid mesentery. Musculoskeletal: Degenerative disc disease at L2-3 and L5-S1 with endplate irregularity and endplate sclerosis especially at L2-3. This has worsened compared to 06/13/2018. IMPRESSION: 1. Rectal wall thickening with redundant dilated sigmoid colon extending up into the upper abdomen, but without overt volvulus. There is also wall thickening in the descending colon, ascending colon, and sigmoid colon. There is  edema in the sigmoid colon mesentery and to a lesser extent in the central bowel mesentery. The appearance is nonspecific but could be due to colitis, inflammatory bowel disease or inflammatory bowel disease; a component of functional colonic abnormalities not excluded given the moderately dilated appearance. 2. Small bilateral pleural effusions. 3. Low-density blood pool suggests anemia. 4. Cholelithiasis. 5. Bilateral nonobstructive nephrolithiasis. 6. Mild prostatomegaly. 7. Degenerative disc disease at L2-3 and L5-S1 with endplate irregularity and endplate sclerosis especially at L2-3. This has worsened compared to 06/13/2018. 8. Contrast medium in the distal esophagus suggesting dysmotility or reflux. 9. Aortic atherosclerosis. Aortic Atherosclerosis (ICD10-I70.0). Electronically Signed   By: Van Clines M.D.   On: 10/12/2020 19:10   DG Chest 2 View  Result Date:  10/12/2020 CLINICAL DATA:  Near-syncope at dialysis. EXAM: CHEST - 2 VIEW COMPARISON:  09/24/2020 FINDINGS: A new right jugular dialysis catheter terminates over the lower SVC. The cardiomediastinal silhouette is unchanged with normal heart size. There are small pleural effusions with mild posterior basilar airspace opacity. The lungs are otherwise clear. No edema or pneumothorax is identified. No acute osseous abnormality is seen. There is persistent gaseous distension of likely large bowel loops in the upper abdomen, also present on the prior study and incompletely evaluated on these chest radiographs. IMPRESSION: 1. Small bilateral pleural effusions with mild basilar atelectasis. 2. Persistent gaseous distension of colon in the upper abdomen, incompletely evaluated. Electronically Signed   By: Logan Bores M.D.   On: 10/12/2020 14:39    My personal review of EKG: Rhythm NSR, Vent. rate 82 BPM PR interval 184 ms QRS duration 96 ms QT/QTc 396/462 ms P-R-T axes 57 7 10  Assessment & Plan:    Active Problems:   Dehydration   ESRD (end stage renal disease) (HCC)   Unspecified atrial fibrillation (HCC)   Syncope  Syncope -Is most likely in the setting of orthostasis, orthostasis most likely due to volume depletion/dehydration. -He received 1 L in ED, still appears to be dry with delayed skin turgor and orthostasis, will continue with 50 cc/h over the next 10 hours. -Increase his Midodrin to 5 mg 3 times daily. -We will hold Toprol-XL specially he is in sinus rhythm, heart rate control. -Monitor on telemetry and obtain 2D echo. -We will check cortisol and TSH -Very likely will need his dry weight to be adjusted during dialysis (patient was telling me the nephrologist has not been taking any extra fluid during dialysis session)  Paroxysmal A. fib -New diagnosis at [facility], continue with amiodarone and Eliquis, will hold metoprolol given orthostasis  Hyperlipidemia -Continue with  statin  ESRD -Started on hemodialysis recently Monday Wednesday Friday, he was not dialyzed today due to near syncope, with renal, they will evaluate tomorrow, no urgent indication for dialysis this evening.  Diarrhea -Abdominal exam is benign, he is afebrile, with no leukocytosis, no indication for antibiotics, discussed CT findings with the patient, he will need outpatient follow-up with GI.  DVT Prophylaxis Eliquis  AM Labs Ordered, also please review Full Orders  Family Communication: Admission, patients condition and plan of care including tests being ordered have been discussed with the patient  who indicate understanding and agree with the plan and Code Status.  Code Status full  Likely DC to  home  Condition GUARDED    Consults called: renal    Admission status: observation    Time spent in minutes : 60 minutes   Phillips Climes M.D on 10/12/2020 at 8:33 PM  [facility] - Office  [phone]

## 2020-10-12 NOTE — ED Provider Notes (Signed)
[facility] EMERGENCY DEPARTMENT Provider Note   CSN: [id number] Arrival date & time: 10/12/20  1308     History Chief Complaint  Patient presents with  . Near Syncope    [name] is a 69 y.o. male with PMHx ESRD on dialysis MWF and DM who presents to the ED today via EMS for near syncope. Pt reports that he went to dialysis today and felt lightheaded and like he was going to pass out. EMS was called and pt was found to be hypotensive however normotensive in the ED at 122/62. Pt does mention that he has had diarrhea over the past several days which is atypical for him. He also had 1 episode of NBNB emesis yesterday. No abdominal pain. Pt denies any recent sick contacts or recent abx use. He is vaccinated against COVID (received doses in April and May however has not had a booster yet). He has not missed any doses of his dialysis recently until today. Pt denies fevers, chills, chest pain, shortness of breath, urinary sx (still makes urine), or any other complaints at this time.   Per chart review:  Pt seen in the ED on 11/05 for tachycardia after dialysis treatment. Recently started dialysis 2 weeks prior. He was found to be significantly hypotensive in the ED with orthostatics and given 2L fluid bolus. Labs including CBC and BMP unremarkable at that time and pt discharged home.   The history is provided by the patient and medical records.       Past Medical History:  Diagnosis Date  . CKD (chronic kidney disease), stage V ([place])   . Diabetes mellitus without complication ([place])   . Diabetes mellitus, type 2 ([place])   . History of hemodialysis     Patient Active Problem List   Diagnosis Date Noted  . Small bowel obstruction ([place])   . Type 2 diabetes mellitus ([place]) 06/11/2018  . Peripheral neuropathy 06/11/2018  . Ankle syndesmosis disruption, right, initial encounter 06/11/2018  . AKI (acute kidney injury) ([place])   . Dehydration   . Open right ankle fracture 06/08/2018  .  Fracture, ankle 06/08/2018  . Open displaced fracture of medial malleolus of tibia, type III 06/08/2018    Past Surgical History:  Procedure Laterality Date  . EXTERNAL FIXATION LEG Right 06/08/2018   Procedure: EXTERNAL FIXATION COMMINUTED FIBULA FRACTURE;  Surgeon: Netta Cedars, MD;  Location: [place];  Service: Orthopedics;  Laterality: Right;  . EXTERNAL FIXATION REMOVAL Right 06/11/2018   Procedure: REMOVAL EXTERNAL FIXATION LEG;  Surgeon: Shona Needles, MD;  Location: [place];  Service: Orthopedics;  Laterality: Right;  . I & D EXTREMITY Right 06/08/2018   Procedure: IRRIGATION AND DEBRIDEMENT OPEN TIBIA FRACTURE;  Surgeon: Netta Cedars, MD;  Location: [place];  Service: Orthopedics;  Laterality: Right;  . I & D EXTREMITY Right 06/11/2018   Procedure: IRRIGATION AND DEBRIDEMENT EXTREMITY;  Surgeon: Shona Needles, MD;  Location: [place];  Service: Orthopedics;  Laterality: Right;  . NOSE SURGERY    . ORIF ANKLE FRACTURE Right 06/08/2018   Procedure: OPEN REDUCTION INTERNAL FIXATION (ORIF) MEDIAL MALLEOLUS;  Surgeon: Netta Cedars, MD;  Location: [facility];  Service: Orthopedics;  Laterality: Right;  . ORIF ANKLE FRACTURE Right 06/11/2018   Procedure: OPEN REDUCTION INTERNAL FIXATION (ORIF) ANKLE FRACTURE;  Surgeon: Shona Needles, MD;  Location: [place];  Service: Orthopedics;  Laterality: Right;  . TIBIA DEBRIDEMENT Right 06/08/2018       No family history on file.  Social History   Tobacco Use  . Smoking status: Never Smoker  . Smokeless tobacco: Never Used  Vaping Use  . Vaping Use: Never used  Substance Use Topics  . Alcohol use: Yes    Comment: occasional  . Drug use: Never    Home Medications Prior to Admission medications   Medication Sig Start Date End Date Taking? Authorizing Provider  amiodarone (PACERONE) 200 MG tablet Take 200 mg by mouth daily. 10/01/20  Yes [provider]  ELIQUIS 2.5 MG TABS tablet Take 2.5 mg by mouth 2 (two) times daily. 10/01/20  Yes  [provider]  metoprolol succinate (TOPROL-XL) 25 MG 24 hr tablet Take 25 mg by mouth daily. 10/09/20  Yes [provider]  midodrine (PROAMATINE) 2.5 MG tablet Take 1 tablet by mouth 3 (three) times daily. 10/02/20  Yes [provider]  ondansetron (ZOFRAN) 4 MG tablet Take by mouth. 10/09/20  Yes [provider]  simvastatin (ZOCOR) 20 MG tablet Take 20 mg by mouth every evening. 04/30/18  Yes [provider]    Allergies    Patient has no known allergies.  Review of Systems   Review of Systems  Constitutional: Negative for chills and fever.  Respiratory: Negative for cough and shortness of breath.   Cardiovascular: Negative for chest pain.  Gastrointestinal: Positive for diarrhea, nausea and vomiting (1 episode yesterday). Negative for abdominal pain.  Genitourinary: Negative for difficulty urinating, flank pain and frequency.  Neurological: Positive for light-headedness.  All other systems reviewed and are negative.   Physical Exam Updated Vital Signs BP 124/60 (BP Location: Right Arm)   Pulse 83   Temp 98.5 F (36.9 C) (Oral)   Resp 16   Ht 6\' 2"  (1.88 m)   Wt 72.6 kg   SpO2 97%   BMI 20.54 kg/m   Physical Exam Vitals and nursing note reviewed.  Constitutional:      Appearance: He is not ill-appearing or diaphoretic.  HENT:     Head: Normocephalic and atraumatic.     Mouth/Throat:     Mouth: Mucous membranes are dry.  Eyes:     Conjunctiva/sclera: Conjunctivae normal.  Cardiovascular:     Rate and Rhythm: Normal rate and regular rhythm.     Pulses: Normal pulses.  Pulmonary:     Effort: Tachypnea present.     Breath sounds: No wheezing, rhonchi or rales.     Comments: RR 30 Abdominal:     Palpations: Abdomen is soft.     Tenderness: There is no abdominal tenderness. There is no guarding or rebound.  Musculoskeletal:     Cervical back: Neck supple.  Skin:    General: Skin is warm and dry.     Coloration: Skin is  pale.  Neurological:     Mental Status: He is alert and oriented to person, place, and time.     ED Results / Procedures / Treatments   Labs (all labs ordered are listed, but only abnormal results are displayed) Labs Reviewed  BASIC METABOLIC PANEL - Abnormal; Notable for the following components:      Result Value   Sodium 131 (*)    Potassium 3.4 (*)    Chloride 97 (*)    Glucose, Bld 152 (*)    BUN 41 (*)    Creatinine, Ser 5.49 (*)    Calcium 7.2 (*)    GFR, Estimated 10 (*)    All other components within normal limits  CBC - Abnormal; Notable for  the following components:   RBC 2.66 (*)    Hemoglobin 8.2 (*)    HCT 27.3 (*)    MCV 102.6 (*)    Platelets 577 (*)    All other components within normal limits  URINALYSIS, ROUTINE W REFLEX MICROSCOPIC - Abnormal; Notable for the following components:   Glucose, UA >=500 (*)    Protein, ur 100 (*)    Bacteria, UA RARE (*)    All other components within normal limits  HEPATIC FUNCTION PANEL - Abnormal; Notable for the following components:   Total Protein 5.1 (*)    Albumin 1.9 (*)    AST 14 (*)    All other components within normal limits  CBG MONITORING, ED - Abnormal; Notable for the following components:   Glucose-Capillary 139 (*)    All other components within normal limits  RESPIRATORY PANEL BY RT PCR (FLU A&B, COVID)  LIPASE, BLOOD  BLOOD GAS, ARTERIAL    EKG EKG Interpretation  Date/Time:  Friday October 12 2020 13:32:41 EST Ventricular Rate:  82 PR Interval:  184 QRS Duration: 96 QT Interval:  396 QTC Calculation: 462 R Axis:   7 Text Interpretation: Sinus rhythm with frequent Premature ventricular complexes Nonspecific ST abnormality Abnormal ECG Artifact Since last tracing PVC new Otherwise no significant change Confirmed by Daleen Bo [phone]) on 10/12/2020 3:25:17 PM   Radiology CT Abdomen Pelvis Wo Contrast  Result Date: 10/12/2020 CLINICAL DATA:  15 pound weight loss in 2 weeks. Nausea  and vomiting. EXAM: CT ABDOMEN AND PELVIS WITHOUT CONTRAST TECHNIQUE: Multidetector CT imaging of the abdomen and pelvis was performed following the standard protocol without IV contrast. COMPARISON:  06/13/2018 FINDINGS: Lower chest: Small bilateral pleural effusions. Contrast medium in the distal esophagus suggesting dysmotility or reflux. Low-density blood pool suggests anemia. Hepatobiliary: Dependent gallstones in the gallbladder. Trace perihepatic ascites. Pancreas: Punctate calcifications in the pancreas likely reflecting chronic calcific pancreatitis. Spleen: Unremarkable Adrenals/Urinary Tract: There about 8 nonobstructive right renal calculi measuring up to 0.5 cm in diameter. There are about 13 nonobstructive left renal calculi measuring up to 0.6 cm in diameter. No hydronephrosis, hydroureter, or ureteral calculus identified. Adrenal glands unremarkable. Stomach/Bowel: Rectal wall thickening noted with redundant dilated sigmoid colon extending up into the upper abdomen, but without overt volvulus. There is wall thickening in the descending colon. Gas-filled dilated transverse colon. Mild segmental wall thickening in the ascending colon. There is edema in the sigmoid colon mesentery and to a lesser extent in the central bowel mesentery normal appendix. No pneumatosis. Vascular/Lymphatic: Aortoiliac atherosclerotic vascular disease. Reproductive: Mild prostatomegaly. Other: Presacral edema. Mild scattered ascites. Mesenteric edema especially along the sigmoid mesentery. Musculoskeletal: Degenerative disc disease at L2-3 and L5-S1 with endplate irregularity and endplate sclerosis especially at L2-3. This has worsened compared to 06/13/2018. IMPRESSION: 1. Rectal wall thickening with redundant dilated sigmoid colon extending up into the upper abdomen, but without overt volvulus. There is also wall thickening in the descending colon, ascending colon, and sigmoid colon. There is edema in the sigmoid colon  mesentery and to a lesser extent in the central bowel mesentery. The appearance is nonspecific but could be due to colitis, inflammatory bowel disease or inflammatory bowel disease; a component of functional colonic abnormalities not excluded given the moderately dilated appearance. 2. Small bilateral pleural effusions. 3. Low-density blood pool suggests anemia. 4. Cholelithiasis. 5. Bilateral nonobstructive nephrolithiasis. 6. Mild prostatomegaly. 7. Degenerative disc disease at L2-3 and L5-S1 with endplate irregularity and endplate sclerosis especially at L2-3. This has  worsened compared to 06/13/2018. 8. Contrast medium in the distal esophagus suggesting dysmotility or reflux. 9. Aortic atherosclerosis. Aortic Atherosclerosis (ICD10-I70.0). Electronically Signed   By: Van Clines M.D.   On: 10/12/2020 19:10   DG Chest 2 View  Result Date: 10/12/2020 CLINICAL DATA:  Near-syncope at dialysis. EXAM: CHEST - 2 VIEW COMPARISON:  09/24/2020 FINDINGS: A new right jugular dialysis catheter terminates over the lower SVC. The cardiomediastinal silhouette is unchanged with normal heart size. There are small pleural effusions with mild posterior basilar airspace opacity. The lungs are otherwise clear. No edema or pneumothorax is identified. No acute osseous abnormality is seen. There is persistent gaseous distension of likely large bowel loops in the upper abdomen, also present on the prior study and incompletely evaluated on these chest radiographs. IMPRESSION: 1. Small bilateral pleural effusions with mild basilar atelectasis. 2. Persistent gaseous distension of colon in the upper abdomen, incompletely evaluated. Electronically Signed   By: Logan Bores M.D.   On: 10/12/2020 14:39    Procedures Procedures (including critical care time)  Medications Ordered in ED Medications  iohexol (OMNIPAQUE) 9 MG/ML oral solution (has no administration in time range)  sodium chloride 0.9 % bolus 500 mL (0 mLs  Intravenous Stopped 10/12/20 1535)  sodium chloride 0.9 % bolus 500 mL (0 mLs Intravenous Stopped 10/12/20 1636)    ED Course  I have reviewed the triage vital signs and the nursing notes.  Pertinent labs & imaging results that were available during my care of the patient were reviewed by me and considered in my medical decision making (see chart for details).    MDM Rules/Calculators/A&P                          69 year old male who presents to the ED today via EMS after near syncopal episode that occurred at dialysis center today.  Patient did not complete dialysis nor did he started today and was transferred here due to feelings of lightheadedness.  He does mention he has had diarrhea for the past several days with nausea, one episode of vomiting yesterday.  No abdominal pain.  On arrival to the ED patient is afebrile, nontachycardic however tachypneic with a rate of 30 on my exam.  On exam patient does appear slightly dry with tacky mucous membranes.  He also appears pale.  He has no abdominal tenderness palpation.  Unremarkable physical exam however will work-up for near syncope at this time.  We will also obtain abdominal screening labs given complaint of diarrhea.  Patient is vaccinated against Covid however has not received his booster dose, in the setting of diarrhea will test for Covid as well.  We will plan for orthostatics and small amount of fluids.  Will continue to monitor in the ED.  CXR with findings: IMPRESSION:  1. Small bilateral pleural effusions with mild basilar atelectasis.  2. Persistent gaseous distension of colon in the upper abdomen,  incompletely evaluated.      Orthostatic Vital Signs Orthostatic Lying  BP- Lying:117/63 Pulse- Lying:77 Orthostatic Sitting BP- Sitting:93/51 Pulse- Sitting:94 Orthostatic Standing at 0 minutes BP- Standing at 0 minutes:63/39 (!) Pulse- Standing at 0 minutes:104   Positive orthostatics. It does appear pt was recently  seen in the ED on 11/05 for tachycardia after dialysis and given 2 L fluid bolus after significant orthostatics. Have discussed pt with nephrology Dr. Johnney Ou who does not believe dialysis would be causing pt's severe orthostatic and tachypnea; recommends total of  1 L fluid bolus and reassessment, ABG and work up for pt's gaseous distension on Xray. She states pt may benefit from repeat ECHO if no improvement in symptoms.   ABG without any significant findings CT scan: IMPRESSION:  1. Rectal wall thickening with redundant dilated sigmoid colon  extending up into the upper abdomen, but without overt volvulus.  There is also wall thickening in the descending colon, ascending  colon, and sigmoid colon. There is edema in the sigmoid colon  mesentery and to a lesser extent in the central bowel mesentery. The  appearance is nonspecific but could be due to colitis, inflammatory  bowel disease or inflammatory bowel disease; a component of  functional colonic abnormalities not excluded given the moderately  dilated appearance.  2. Small bilateral pleural effusions.  3. Low-density blood pool suggests anemia.  4. Cholelithiasis.  5. Bilateral nonobstructive nephrolithiasis.  6. Mild prostatomegaly.  7. Degenerative disc disease at L2-3 and L5-S1 with endplate  irregularity and endplate sclerosis especially at L2-3. This has  worsened compared to 06/13/2018.  8. Contrast medium in the distal esophagus suggesting dysmotility or  reflux.  9. Aortic atherosclerosis.   Repeat vitals after total 1L fluid bolus Orthostatic Lying  BP- Lying:127/67 Pulse- Lying:79 Orthostatic Sitting BP- Sitting:94/63 Pulse- Sitting:95 Orthostatic Standing at 0 minutes BP- Standing at 0 minutes:71/48 (!) Pulse- Standing at 0 minutes:110  On reassessment pt reports no improvement in symptoms. Do feel he needs admission at this time for IV hydration and possible ECHO cardiogram. Pt amenable to admission.    Discussed case with [facility] Dr. Waldron Labs who agrees to evaluate patient for admission.   This note was prepared using Dragon voice recognition software and may include unintentional dictation errors due to the inherent limitations of voice recognition software.  Final Clinical Impression(s) / ED Diagnoses Final diagnoses:  Dehydration  Orthostatic hypotension  ESRD on dialysis ([place])  Diarrhea, unspecified type    Rx / DC Orders ED Discharge Orders    None       Eustaquio Maize, PA-C 10/12/20 1950    Daleen Bo, MD 10/12/20 2002

## 2020-10-12 NOTE — ED Triage Notes (Signed)
Pt brought to ED via [facility]. Pt states he was at dialysis and started to feel faint, did not get any dialysis. Per EMS, pt hypotensive.

## 2020-10-13 ENCOUNTER — Observation Stay (HOSPITAL_COMMUNITY): Payer: Medicare Other

## 2020-10-13 ENCOUNTER — Encounter (HOSPITAL_COMMUNITY): Payer: Self-pay | Admitting: Internal Medicine

## 2020-10-13 DIAGNOSIS — I4891 Unspecified atrial fibrillation: Secondary | ICD-10-CM | POA: Diagnosis not present

## 2020-10-13 DIAGNOSIS — E119 Type 2 diabetes mellitus without complications: Secondary | ICD-10-CM | POA: Diagnosis not present

## 2020-10-13 DIAGNOSIS — R55 Syncope and collapse: Secondary | ICD-10-CM | POA: Diagnosis not present

## 2020-10-13 DIAGNOSIS — E1122 Type 2 diabetes mellitus with diabetic chronic kidney disease: Secondary | ICD-10-CM | POA: Diagnosis not present

## 2020-10-13 DIAGNOSIS — R140 Abdominal distension (gaseous): Secondary | ICD-10-CM | POA: Diagnosis not present

## 2020-10-13 DIAGNOSIS — Z6820 Body mass index (BMI) 20.0-20.9, adult: Secondary | ICD-10-CM | POA: Diagnosis not present

## 2020-10-13 DIAGNOSIS — D75839 Thrombocytosis, unspecified: Secondary | ICD-10-CM | POA: Diagnosis present

## 2020-10-13 DIAGNOSIS — I10 Essential (primary) hypertension: Secondary | ICD-10-CM | POA: Diagnosis not present

## 2020-10-13 DIAGNOSIS — E861 Hypovolemia: Secondary | ICD-10-CM | POA: Diagnosis present

## 2020-10-13 DIAGNOSIS — R7989 Other specified abnormal findings of blood chemistry: Secondary | ICD-10-CM | POA: Diagnosis not present

## 2020-10-13 DIAGNOSIS — D62 Acute posthemorrhagic anemia: Secondary | ICD-10-CM | POA: Diagnosis not present

## 2020-10-13 DIAGNOSIS — E1142 Type 2 diabetes mellitus with diabetic polyneuropathy: Secondary | ICD-10-CM | POA: Diagnosis present

## 2020-10-13 DIAGNOSIS — K633 Ulcer of intestine: Secondary | ICD-10-CM | POA: Diagnosis not present

## 2020-10-13 DIAGNOSIS — R197 Diarrhea, unspecified: Secondary | ICD-10-CM | POA: Diagnosis not present

## 2020-10-13 DIAGNOSIS — Z992 Dependence on renal dialysis: Secondary | ICD-10-CM | POA: Diagnosis not present

## 2020-10-13 DIAGNOSIS — R279 Unspecified lack of coordination: Secondary | ICD-10-CM | POA: Diagnosis not present

## 2020-10-13 DIAGNOSIS — E46 Unspecified protein-calorie malnutrition: Secondary | ICD-10-CM | POA: Diagnosis not present

## 2020-10-13 DIAGNOSIS — I951 Orthostatic hypotension: Secondary | ICD-10-CM | POA: Diagnosis present

## 2020-10-13 DIAGNOSIS — J9811 Atelectasis: Secondary | ICD-10-CM | POA: Diagnosis not present

## 2020-10-13 DIAGNOSIS — I953 Hypotension of hemodialysis: Secondary | ICD-10-CM | POA: Diagnosis not present

## 2020-10-13 DIAGNOSIS — K529 Noninfective gastroenteritis and colitis, unspecified: Secondary | ICD-10-CM

## 2020-10-13 DIAGNOSIS — K6389 Other specified diseases of intestine: Secondary | ICD-10-CM | POA: Diagnosis not present

## 2020-10-13 DIAGNOSIS — R933 Abnormal findings on diagnostic imaging of other parts of digestive tract: Secondary | ICD-10-CM | POA: Diagnosis not present

## 2020-10-13 DIAGNOSIS — L89152 Pressure ulcer of sacral region, stage 2: Secondary | ICD-10-CM | POA: Diagnosis present

## 2020-10-13 DIAGNOSIS — I132 Hypertensive heart and chronic kidney disease with heart failure and with stage 5 chronic kidney disease, or end stage renal disease: Secondary | ICD-10-CM | POA: Diagnosis not present

## 2020-10-13 DIAGNOSIS — K922 Gastrointestinal hemorrhage, unspecified: Secondary | ICD-10-CM | POA: Diagnosis not present

## 2020-10-13 DIAGNOSIS — L8990 Pressure ulcer of unspecified site, unspecified stage: Secondary | ICD-10-CM | POA: Insufficient documentation

## 2020-10-13 DIAGNOSIS — E876 Hypokalemia: Secondary | ICD-10-CM | POA: Diagnosis present

## 2020-10-13 DIAGNOSIS — N186 End stage renal disease: Secondary | ICD-10-CM

## 2020-10-13 DIAGNOSIS — K5180 Other ulcerative colitis without complications: Secondary | ICD-10-CM | POA: Diagnosis not present

## 2020-10-13 DIAGNOSIS — Z20822 Contact with and (suspected) exposure to covid-19: Secondary | ICD-10-CM | POA: Diagnosis not present

## 2020-10-13 DIAGNOSIS — K625 Hemorrhage of anus and rectum: Secondary | ICD-10-CM | POA: Diagnosis not present

## 2020-10-13 DIAGNOSIS — K560 Paralytic ileus: Secondary | ICD-10-CM | POA: Diagnosis not present

## 2020-10-13 DIAGNOSIS — K8020 Calculus of gallbladder without cholecystitis without obstruction: Secondary | ICD-10-CM | POA: Diagnosis not present

## 2020-10-13 DIAGNOSIS — Z9119 Patient's noncompliance with other medical treatment and regimen: Secondary | ICD-10-CM | POA: Diagnosis not present

## 2020-10-13 DIAGNOSIS — E860 Dehydration: Secondary | ICD-10-CM | POA: Diagnosis not present

## 2020-10-13 DIAGNOSIS — A09 Infectious gastroenteritis and colitis, unspecified: Secondary | ICD-10-CM | POA: Diagnosis not present

## 2020-10-13 DIAGNOSIS — I480 Paroxysmal atrial fibrillation: Secondary | ICD-10-CM | POA: Diagnosis present

## 2020-10-13 DIAGNOSIS — D649 Anemia, unspecified: Secondary | ICD-10-CM | POA: Diagnosis not present

## 2020-10-13 DIAGNOSIS — D631 Anemia in chronic kidney disease: Secondary | ICD-10-CM | POA: Diagnosis present

## 2020-10-13 DIAGNOSIS — J90 Pleural effusion, not elsewhere classified: Secondary | ICD-10-CM | POA: Diagnosis not present

## 2020-10-13 DIAGNOSIS — E785 Hyperlipidemia, unspecified: Secondary | ICD-10-CM | POA: Diagnosis present

## 2020-10-13 LAB — BASIC METABOLIC PANEL
Anion gap: 10 (ref 5–15)
BUN: 45 mg/dL — ABNORMAL HIGH (ref 8–23)
CO2: 22 mmol/L (ref 22–32)
Calcium: 7.2 mg/dL — ABNORMAL LOW (ref 8.9–10.3)
Chloride: 99 mmol/L (ref 98–111)
Creatinine, Ser: 5.33 mg/dL — ABNORMAL HIGH (ref 0.61–1.24)
GFR, Estimated: 11 mL/min — ABNORMAL LOW (ref >60)
Glucose, Bld: 95 mg/dL (ref 70–99)
Potassium: 3.3 mmol/L — ABNORMAL LOW (ref 3.5–5.1)
Sodium: 131 mmol/L — ABNORMAL LOW (ref 135–145)

## 2020-10-13 LAB — CBC
HCT: 26.4 % — ABNORMAL LOW (ref 39.0–52.0)
Hemoglobin: 7.8 g/dL — ABNORMAL LOW (ref 13.0–17.0)
MCH: 29.7 pg (ref 26.0–34.0)
MCHC: 29.5 g/dL — ABNORMAL LOW (ref 30.0–36.0)
MCV: 100.4 fL — ABNORMAL HIGH (ref 80.0–100.0)
Platelets: 573 10*3/uL — ABNORMAL HIGH (ref 150–400)
RBC: 2.63 MIL/uL — ABNORMAL LOW (ref 4.22–5.81)
RDW: 13.5 % (ref 11.5–15.5)
WBC: 6 10*3/uL (ref 4.0–10.5)
nRBC: 0 % (ref 0.0–0.2)

## 2020-10-13 LAB — ECHOCARDIOGRAM COMPLETE
Area-P 1/2: 3.66 cm2
Height: 74 in
S' Lateral: 3.66 cm
Weight: 2560 oz

## 2020-10-13 LAB — MRSA PCR SCREENING: MRSA by PCR: NEGATIVE

## 2020-10-13 LAB — CBG MONITORING, ED: Glucose-Capillary: 88 mg/dL (ref 70–99)

## 2020-10-13 LAB — PHOSPHORUS: Phosphorus: 4.3 mg/dL (ref 2.5–4.6)

## 2020-10-13 LAB — HIV ANTIBODY (ROUTINE TESTING W REFLEX): HIV Screen 4th Generation wRfx: NONREACTIVE

## 2020-10-13 LAB — CORTISOL: Cortisol, Plasma: 25.2 ug/dL

## 2020-10-13 MED ORDER — CIPROFLOXACIN HCL 250 MG PO TABS
500.0000 mg | ORAL_TABLET | Freq: Every day | ORAL | Status: DC
  Administered 2020-10-13 – 2020-10-22 (×10): 500 mg via ORAL
  Filled 2020-10-13 (×10): qty 2

## 2020-10-13 MED ORDER — SODIUM CHLORIDE 0.9 % IV SOLN: 100.0000 mL | INTRAVENOUS | Status: DC | PRN

## 2020-10-13 MED ORDER — SODIUM CHLORIDE 0.9 % IV BOLUS
500.0000 mL | Freq: Once | INTRAVENOUS | Status: AC
  Administered 2020-10-13: 500 mL via INTRAVENOUS

## 2020-10-13 MED ORDER — METRONIDAZOLE 500 MG PO TABS
500.0000 mg | ORAL_TABLET | Freq: Three times a day (TID) | ORAL | Status: DC
  Administered 2020-10-13 – 2020-10-23 (×26): 500 mg via ORAL
  Filled 2020-10-13 (×27): qty 1

## 2020-10-13 MED ORDER — SODIUM CHLORIDE 0.9 % IV SOLN: INTRAVENOUS | Status: DC

## 2020-10-13 MED ORDER — ALBUMIN HUMAN 25 % IV SOLN
25.0000 g | Freq: Once | INTRAVENOUS | Status: AC
  Administered 2020-10-13: 25 g via INTRAVENOUS

## 2020-10-13 MED ORDER — NEPRO/CARBSTEADY PO LIQD
237.0000 mL | Freq: Two times a day (BID) | ORAL | Status: DC
  Administered 2020-10-14 – 2020-10-18 (×4): 237 mL via ORAL

## 2020-10-13 MED ORDER — HEPARIN SODIUM (PORCINE) 1000 UNIT/ML DIALYSIS
3800.0000 [IU] | INTRAMUSCULAR | Status: DC | PRN
  Administered 2020-10-14: 3800 [IU] via INTRAVENOUS_CENTRAL
  Filled 2020-10-13 (×6): qty 4

## 2020-10-13 MED ORDER — PROCHLORPERAZINE EDISYLATE 10 MG/2ML IJ SOLN
10.0000 mg | Freq: Four times a day (QID) | INTRAMUSCULAR | Status: DC | PRN
  Administered 2020-10-13: 10 mg via INTRAVENOUS
  Filled 2020-10-13: qty 2

## 2020-10-13 MED ORDER — ALTEPLASE 2 MG IJ SOLR
2.0000 mg | Freq: Once | INTRAMUSCULAR | Status: DC | PRN
  Filled 2020-10-13 (×2): qty 2

## 2020-10-13 MED ORDER — ONDANSETRON HCL 4 MG/2ML IJ SOLN
4.0000 mg | Freq: Three times a day (TID) | INTRAMUSCULAR | Status: DC | PRN
  Administered 2020-10-13 – 2020-10-24 (×6): 4 mg via INTRAVENOUS
  Filled 2020-10-13 (×6): qty 2

## 2020-10-13 MED ORDER — FAMOTIDINE 20 MG PO TABS
20.0000 mg | ORAL_TABLET | Freq: Once | ORAL | Status: AC
  Administered 2020-10-13: 20 mg via ORAL
  Filled 2020-10-13: qty 1

## 2020-10-13 MED ORDER — CHLORHEXIDINE GLUCONATE CLOTH 2 % EX PADS
6.0000 | MEDICATED_PAD | Freq: Every day | CUTANEOUS | Status: DC
  Administered 2020-10-14 – 2020-10-29 (×7): 6 via TOPICAL

## 2020-10-13 MED ORDER — HEPARIN SODIUM (PORCINE) 1000 UNIT/ML DIALYSIS: 1000.0000 [IU] | INTRAMUSCULAR | Status: DC | PRN

## 2020-10-13 NOTE — ED Notes (Signed)
Pt given cup of water 

## 2020-10-13 NOTE — Procedures (Signed)
   HEMODIALYSIS TREATMENT NOTE:  3 hour heparin-free HD completed via [facility].  Cath exit site is unremarkable.  Kept even / no fluid removed as per order.  Became hypotensive 15 minutes into treatment, asymptomatic.  Albumin 25g was given.  All blood was returned.  Rockwell Alexandria, RN

## 2020-10-13 NOTE — Progress Notes (Signed)
PROGRESS NOTE  [name]  OZH:[id number] DOB: March 20, 1951 DOA: 10/12/2020 PCP: Patient, No Pcp Per   Chief Complaint  Patient presents with  . Near Syncope   Brief Admission History:  69 y.o. male, with past medical history of diabetes mellitus, CKD stage V, recent hospitalization at [facility] for which he started hemodialysis for ESRD on MWF schedule, and diagnosed with A. fib when he started on amiodarone, metoprolol and Eliquis, patient was sent from hemodialysis center for near syncope and orthostasis, patient reports he was discharged last week from [place], he is on hemodialysis center since Monday, he does report syncope on Monday after dialysis, as well he does report orthostasis and near syncope for last couple days which he was started on midodrine by his nephrologist, report this morning prior to dialysis he felt dizzy and lightheaded where he was hypotensive for which they sent him to ED, patient report he is still making good amount of urine daily, reports his weight 2 weeks ago was 29.2 kg, and during most dialysis session was at 76, as well he does report diarrhea x2 weeks, he denies fever, chills or abdominal pain, he does report episodes of vomiting on Wednesday, but no recurrence, overall he reports good oral intake and fluid consumption.  Patient was noted to be orthostatic127/67 supine, 71/48 standing with no improvement despite receiving 500 cc x 2 bolus, Significant for potassium of 3.2, sodium of 131, nine 8.2, CT abdomen pelvis significant for wall thickening in the rectal area, with redundant dilated sigmoid colon, but no overt volvulus, so [facility] hospital[facility] consulted to admit.  Assessment & Plan:   Active Problems:   Dehydration   ESRD (end stage renal disease) (HCC)   Unspecified atrial fibrillation (HCC)   Syncope   Orthostatic hypotension   Chronic diarrhea   Colitis presumed infectious  1. Orthostatic hypotension - from intravascular volume depletion, he  has ongoing diarrhea from unspecified colitis and needs IV fluids to keep up with his maintenance needs.  IV bolus 500cc ordered and maintenance IV fluids ordered. I discussed with nephrologist Dr. Johnney Ou.   2. ESRD - recently started HD.  Dr. Johnney Ou will not remove fluid from next HD treatment.  3. Chronic diarrhea - secondary to unspecified colitis. He is volume depleted.  GI consultation requested.  4. Colitis presumed infectious - C diff and stool studies and Gi started on antibiotics.  Further recommendations to follow.  Appreciate GI follow up.   5. Paroxysmal atrial fibrillation - apixaban and amiodarone, temporarily holding metoprol in setting of orthostatic hypotension.  6. Hyperlipidemia - resumed home statin therapy.  7. Recurrent syncope - secondary to orthostatic hypotension, repeat Echocardiogram pending.  Treating dehydration and addressing chronic diarrhea.  8. Anemia of CKD - Hg holding stable will follow closely.  Transfuse for Hg<7.  9. Thrombocytosis - following.  10. Hypokalemia/hyponatremia - recheck CBC in AM.  Addressing with HD treatment. 11. Hypocalcemia - check albumin.     DVT prophylaxis: apixaban Code Status: full  Family Communication: t/c to [employer], no answer unable to leave message Disposition: Home   Status is: Inpatient  Remains inpatient appropriate because:IV treatments appropriate due to intensity of illness or inability to take PO and Inpatient level of care appropriate due to severity of illness  Dispo: The patient is from: Home              Anticipated d/c is to: Home  Anticipated d/c date is: 2 days              Patient currently is not medically stable to d/c.  Consultants:   Nephrology  Gastroenterology  Procedures:   ESRD  2D Echo 11/13  IMPRESSIONS  1. Left ventricular ejection fraction, by estimation, is 60 to 65%. The left ventricle has normal function. The left ventricle has no regional wall motion abnormalities. Left  ventricular diastolic parameters are consistent with Grade I diastolic dysfunction (impaired relaxation). Elevated left atrial pressure.  2. Right ventricular systolic function is normal. The right ventricular size is normal. There is normal pulmonary artery systolic pressure.  3. The mitral valve is normal in structure. No evidence of mitral valve regurgitation. No evidence of mitral stenosis.  4. The aortic valve is normal in structure. Aortic valve regurgitation is not visualized. No aortic stenosis is present.  5. There is Moderate (Grade III) protruding plaque involving the transverse aorta.  6. The inferior vena cava is normal in size with greater than 50% respiratory variability, suggesting right atrial pressure of 3 mmHg.    Antimicrobials:  Ciprofloxacin 11/13 >> Metronidazole 11/13>>   Subjective: Pt reports that he feels weak and he is afraid that he will pass out if he start ambulating like he has done in the past. He continues to have loose stools.  He is not eating and drinking very well.   Objective: Vitals:   10/13/20 1130 10/13/20 1200 10/13/20 1232 10/13/20 1252  BP: 136/72 125/71 132/71 96/60  Pulse: 81 81 81 (!) 105  Resp: 20 14 18 18   Temp:   99 F (37.2 C) 99.3 F (37.4 C)  TempSrc:   Oral Oral  SpO2: 95% 95% 97% 100%  Weight:    74.5 kg  Height:    6\' 2"  (1.88 m)    Intake/Output Summary (Last 24 hours) at 10/13/2020 1329 Last data filed at 10/13/2020 0555 Gross per 24 hour  Intake 1000 ml  Output 500 ml  Net 500 ml   Filed Weights   10/12/20 1317 10/13/20 1252  Weight: 72.6 kg 74.5 kg    Examination:  General exam: Appears calm and comfortable  Respiratory system: Clear to auscultation. Respiratory effort normal. Cardiovascular system: normal S1 & S2 heard, RRR. No JVD, murmurs, rubs, gallops or clicks. No pedal edema. Gastrointestinal system: Abdomen is nondistended, soft and nontender. No organomegaly or masses felt. Normal bowel sounds  heard. Central nervous system: Alert and oriented. No focal neurological deficits. Extremities: Symmetric 5 x 5 power. Skin: No rashes, lesions or ulcers Psychiatry: Judgement and insight appear normal. Flat affect.   Data Reviewed: I have personally reviewed following labs and imaging studies  CBC: Recent Labs  Lab 10/12/20 1344 10/13/20 0434  WBC 8.3 6.0  HGB 8.2* 7.8*  HCT 27.3* 26.4*  MCV 102.6* 100.4*  PLT 577* 573*    Basic Metabolic Panel: Recent Labs  Lab 10/12/20 1344 10/13/20 0434  NA 131* 131*  K 3.4* 3.3*  CL 97* 99  CO2 23 22  GLUCOSE 152* 95  BUN 41* 45*  CREATININE 5.49* 5.33*  CALCIUM 7.2* 7.2*    GFR: Estimated Creatinine Clearance: 13.6 mL/min (A) (by C-G formula based on SCr of 5.33 mg/dL (H)).  Liver Function Tests: Recent Labs  Lab 10/12/20 1348  AST 14*  ALT 15  ALKPHOS 74  BILITOT 0.6  PROT 5.1*  ALBUMIN 1.9*    CBG: Recent Labs  Lab 10/12/20 1344 10/13/20 0635  GLUCAP  139* 88    Recent Results (from the past 240 hour(s))  Respiratory Panel by RT PCR (Flu A&B, Covid) - Nasopharyngeal Swab     Status: None   Collection Time: 10/12/20  3:13 PM   Specimen: Nasopharyngeal Swab  Result Value Ref Range Status   SARS Coronavirus 2 by RT PCR NEGATIVE NEGATIVE Final    Comment: (NOTE) SARS-CoV-2 target nucleic acids are NOT DETECTED.  The SARS-CoV-2 RNA is generally detectable in upper respiratoy specimens during the acute phase of infection. The lowest concentration of SARS-CoV-2 viral copies this assay can detect is 131 copies/mL. A negative result does not preclude SARS-Cov-2 infection and should not be used as the sole basis for treatment or other patient management decisions. A negative result may occur with  improper specimen collection/handling, submission of specimen other than nasopharyngeal swab, presence of viral mutation(s) within the areas targeted by this assay, and inadequate number of viral copies (<131  copies/mL). A negative result must be combined with clinical observations, patient history, and epidemiological information. The expected result is Negative.  Fact Sheet for Patients:  PinkCheek.be  Fact Sheet for Healthcare Providers:  GravelBags.it  This test is no t yet approved or cleared by the [place] FDA and  has been authorized for detection and/or diagnosis of SARS-CoV-2 by FDA under an Emergency Use Authorization (EUA). This EUA will remain  in effect (meaning this test can be used) for the duration of the COVID-19 declaration under Section 564(b)(1) of the Act, 21 U.S.C. section 360bbb-3(b)(1), unless the authorization is terminated or revoked sooner.     Influenza A by PCR NEGATIVE NEGATIVE Final   Influenza B by PCR NEGATIVE NEGATIVE Final    Comment: (NOTE) The Xpert Xpress SARS-CoV-2/FLU/RSV assay is intended as an aid in  the diagnosis of influenza from Nasopharyngeal swab specimens and  should not be used as a sole basis for treatment. Nasal washings and  aspirates are unacceptable for Xpert Xpress SARS-CoV-2/FLU/RSV  testing.  Fact Sheet for Patients: PinkCheek.be  Fact Sheet for Healthcare Providers: GravelBags.it  This test is not yet approved or cleared by the [place] FDA and  has been authorized for detection and/or diagnosis of SARS-CoV-2 by  FDA under an Emergency Use Authorization (EUA). This EUA will remain  in effect (meaning this test can be used) for the duration of the  Covid-19 declaration under Section 564(b)(1) of the Act, 21  U.S.C. section 360bbb-3(b)(1), unless the authorization is  terminated or revoked. Performed at [facility], [address] [place], [place] [address]      Radiology Studies: CT Abdomen Pelvis Wo Contrast  Result Date: 10/12/2020 CLINICAL DATA:  15 pound weight loss in 2 weeks. Nausea and  vomiting. EXAM: CT ABDOMEN AND PELVIS WITHOUT CONTRAST TECHNIQUE: Multidetector CT imaging of the abdomen and pelvis was performed following the standard protocol without IV contrast. COMPARISON:  06/13/2018 FINDINGS: Lower chest: Small bilateral pleural effusions. Contrast medium in the distal esophagus suggesting dysmotility or reflux. Low-density blood pool suggests anemia. Hepatobiliary: Dependent gallstones in the gallbladder. Trace perihepatic ascites. Pancreas: Punctate calcifications in the pancreas likely reflecting chronic calcific pancreatitis. Spleen: Unremarkable Adrenals/Urinary Tract: There about 8 nonobstructive right renal calculi measuring up to 0.5 cm in diameter. There are about 13 nonobstructive left renal calculi measuring up to 0.6 cm in diameter. No hydronephrosis, hydroureter, or ureteral calculus identified. Adrenal glands unremarkable. Stomach/Bowel: Rectal wall thickening noted with redundant dilated sigmoid colon extending up into the upper abdomen, but without overt  volvulus. There is wall thickening in the descending colon. Gas-filled dilated transverse colon. Mild segmental wall thickening in the ascending colon. There is edema in the sigmoid colon mesentery and to a lesser extent in the central bowel mesentery normal appendix. No pneumatosis. Vascular/Lymphatic: Aortoiliac atherosclerotic vascular disease. Reproductive: Mild prostatomegaly. Other: Presacral edema. Mild scattered ascites. Mesenteric edema especially along the sigmoid mesentery. Musculoskeletal: Degenerative disc disease at L2-3 and L5-S1 with endplate irregularity and endplate sclerosis especially at L2-3. This has worsened compared to 06/13/2018. IMPRESSION: 1. Rectal wall thickening with redundant dilated sigmoid colon extending up into the upper abdomen, but without overt volvulus. There is also wall thickening in the descending colon, ascending colon, and sigmoid colon. There is edema in the sigmoid colon  mesentery and to a lesser extent in the central bowel mesentery. The appearance is nonspecific but could be due to colitis, inflammatory bowel disease or inflammatory bowel disease; a component of functional colonic abnormalities not excluded given the moderately dilated appearance. 2. Small bilateral pleural effusions. 3. Low-density blood pool suggests anemia. 4. Cholelithiasis. 5. Bilateral nonobstructive nephrolithiasis. 6. Mild prostatomegaly. 7. Degenerative disc disease at L2-3 and L5-S1 with endplate irregularity and endplate sclerosis especially at L2-3. This has worsened compared to 06/13/2018. 8. Contrast medium in the distal esophagus suggesting dysmotility or reflux. 9. Aortic atherosclerosis. Aortic Atherosclerosis (ICD10-I70.0). Electronically Signed   By: Van Clines M.D.   On: 10/12/2020 19:10   DG Chest 2 View  Result Date: 10/12/2020 CLINICAL DATA:  Near-syncope at dialysis. EXAM: CHEST - 2 VIEW COMPARISON:  09/24/2020 FINDINGS: A new right jugular dialysis catheter terminates over the lower SVC. The cardiomediastinal silhouette is unchanged with normal heart size. There are small pleural effusions with mild posterior basilar airspace opacity. The lungs are otherwise clear. No edema or pneumothorax is identified. No acute osseous abnormality is seen. There is persistent gaseous distension of likely large bowel loops in the upper abdomen, also present on the prior study and incompletely evaluated on these chest radiographs. IMPRESSION: 1. Small bilateral pleural effusions with mild basilar atelectasis. 2. Persistent gaseous distension of colon in the upper abdomen, incompletely evaluated. Electronically Signed   By: Logan Bores M.D.   On: 10/12/2020 14:39   ECHOCARDIOGRAM COMPLETE  Result Date: 10/13/2020    ECHOCARDIOGRAM REPORT   Patient Name:   [name] Date of Exam: 10/13/2020 Medical Rec #:  [id number]   Height:       74.0 in Accession #:    [id number]  Weight:       160.0  lb Date of Birth:  1950/12/10   BSA:          1.977 m Patient Age:    37 years    BP:           133/73 mmHg Patient Gender: M           HR:           74 bpm. Exam Location:  [facility] Procedure: 2D Echo, Cardiac Doppler and Color Doppler Indications:    Syncope  History:        Patient has no prior history of Echocardiogram examinations.                 Arrythmias:Atrial Fibrillation, Signs/Symptoms:Syncope; Risk                 Factors:Dyslipidemia. ESRD.  Sonographer:    Dustin Flock RDCS Referring Phys: [place]  1. Left ventricular ejection  fraction, by estimation, is 60 to 65%. The left ventricle has normal function. The left ventricle has no regional wall motion abnormalities. Left ventricular diastolic parameters are consistent with Grade I diastolic dysfunction (impaired relaxation). Elevated left atrial pressure.  2. Right ventricular systolic function is normal. The right ventricular size is normal. There is normal pulmonary artery systolic pressure.  3. The mitral valve is normal in structure. No evidence of mitral valve regurgitation. No evidence of mitral stenosis.  4. The aortic valve is normal in structure. Aortic valve regurgitation is not visualized. No aortic stenosis is present.  5. There is Moderate (Grade III) protruding plaque involving the transverse aorta.  6. The inferior vena cava is normal in size with greater than 50% respiratory variability, suggesting right atrial pressure of 3 mmHg. FINDINGS  Left Ventricle: Left ventricular ejection fraction, by estimation, is 60 to 65%. The left ventricle has normal function. The left ventricle has no regional wall motion abnormalities. The left ventricular internal cavity size was normal in size. There is  no left ventricular hypertrophy. Left ventricular diastolic parameters are consistent with Grade I diastolic dysfunction (impaired relaxation). Elevated left atrial pressure. Right Ventricle: The right ventricular  size is normal. No increase in right ventricular wall thickness. Right ventricular systolic function is normal. There is normal pulmonary artery systolic pressure. The tricuspid regurgitant velocity is 2.55 m/s, and  with an assumed right atrial pressure of 3 mmHg, the estimated right ventricular systolic pressure is 78.6 mmHg. Left Atrium: Left atrial size was normal in size. Right Atrium: Right atrial size was normal in size. Prominent Eustachian valve. Pericardium: There is no evidence of pericardial effusion. Mitral Valve: The mitral valve is normal in structure. No evidence of mitral valve regurgitation. No evidence of mitral valve stenosis. Tricuspid Valve: The tricuspid valve is normal in structure. Tricuspid valve regurgitation is trivial. No evidence of tricuspid stenosis. Aortic Valve: The aortic valve is normal in structure. Aortic valve regurgitation is not visualized. No aortic stenosis is present. Pulmonic Valve: The pulmonic valve was normal in structure. Pulmonic valve regurgitation is not visualized. No evidence of pulmonic stenosis. Aorta: The aortic root is normal in size and structure. There is moderate (Grade III) protruding plaque involving the transverse aorta. Venous: The inferior vena cava is normal in size with greater than 50% respiratory variability, suggesting right atrial pressure of 3 mmHg. IAS/Shunts: No atrial level shunt detected by color flow Doppler.  LEFT VENTRICLE PLAX 2D LVIDd:         4.96 cm  Diastology LVIDs:         3.66 cm  LV e' medial:    7.29 cm/s LV PW:         1.16 cm  LV E/e' medial:  9.7 LV IVS:        1.17 cm  LV e' lateral:   8.38 cm/s LVOT diam:     2.50 cm  LV E/e' lateral: 8.4 LV SV:         101 LV SV Index:   51 LVOT Area:     4.91 cm  RIGHT VENTRICLE RV Basal diam:  3.10 cm RV S prime:     8.70 cm/s TAPSE (M-mode): 4.2 cm LEFT ATRIUM             Index       RIGHT ATRIUM           Index LA diam:        3.80 cm 1.92 cm/m  RA Area:     19.40 cm LA Vol (A2C):    49.9 ml 25.24 ml/m RA Volume:   53.30 ml  26.96 ml/m LA Vol (A4C):   50.8 ml 25.70 ml/m LA Biplane Vol: 53.0 ml 26.81 ml/m  AORTIC VALVE LVOT Vmax:   99.20 cm/s LVOT Vmean:  67.300 cm/s LVOT VTI:    0.206 m  AORTA Ao Root diam: 3.50 cm MITRAL VALVE               TRICUSPID VALVE MV Area (PHT): 3.66 cm    TR Peak grad:   26.0 mmHg MV Decel Time: 207 msec    TR Vmax:        255.00 cm/s MV E velocity: 70.60 cm/s MV A velocity: 98.50 cm/s  SHUNTS MV E/A ratio:  0.72        Systemic VTI:  0.21 m                            Systemic Diam: 2.50 cm Dani Gobble Croitoru MD Electronically signed by Sanda Klein MD Signature Date/Time: 10/13/2020/1:20:39 PM    Final    Scheduled Meds: . amiodarone  200 mg Oral Daily  . apixaban  2.5 mg Oral BID  . Chlorhexidine Gluconate Cloth  6 each Topical Q0600  . ciprofloxacin  500 mg Oral Daily  . metroNIDAZOLE  500 mg Oral Q8H  . midodrine  5 mg Oral TID  . simvastatin  20 mg Oral QPM  . sodium chloride flush  3 mL Intravenous Q12H   Continuous Infusions: . sodium chloride    . sodium chloride      LOS: 0 days   Time spent: 32 mins  Rocquel Askren Wynetta Emery, MD How to contact the [facility] Attending or Consulting provider [place] or covering provider during after hours [place], for this patient?  1. Check the care team in [facility] and look for a) attending/consulting [facility] provider listed and b) the [facility] team listed 2. Log into [email or web address] and use [place]'s universal password to access. If you do not have the password, please contact the hospital operator. 3. Locate the [facility] provider you are looking for under [facility] and page to a number that you can be directly reached. 4. If you still have difficulty reaching the provider, please page the [facility] (Director on Call) for the Hospitalists listed on amion for assistance.  10/13/2020, 1:29 PM

## 2020-10-13 NOTE — ED Notes (Signed)
Pt in bed, pt resps are even and unlabored, pt in stable condition, pt awaits dialysis, pt offers no complaints at this time.

## 2020-10-13 NOTE — Consult Note (Signed)
Consulting  Provider: Dr. Tery Sanfilippo Primary Care Physician:  Patient, No Pcp Per  Reason for Consultation: Colitis, diarrhea  HPI:  [name] is a 69 y.o. male with a past medical history of end-stage renal disease on hemodialysis Monday Wednesday Friday, A. fib chronically on Eliquis, who presented to [facility] ER yesterday for generalized weakness/fatigue and near syncope.  During his work-up patient was found to be orthostatic with standing blood pressure 71/48.  Nephrology has been consulted and patient is scheduled to undergo dialysis today.  From a GI standpoint, patient states he has had diarrhea for nearly 2 weeks now.  Describes loose, nonbloody.  Does have some lower abdominal pain as well but this is mild.  Pain does not radiate.  He underwent a CT scan which I personally reviewed which showed wall thickening of the descending, ascending, sigmoid colon and rectum.  Findings are suggestive of colitis.  Patient denies any sick contacts at home.  No recent changes in diet.  Denies any history of underlying inflammatory bowel disease.  No family history of inflammatory bowel disease.  Hemoglobin 7.8 which is near his baseline.  Patient states he is up-to-date on his colonoscopy but cannot tell me when.  States he had this performed in [place].  Unsure if he is ever had polyps.  No family history of colorectal malignancy.  Past Medical History:  Diagnosis Date  . CKD (chronic kidney disease), stage V ([place])   . Diabetes mellitus without complication ([place])   . Diabetes mellitus, type 2 ([place])   . History of hemodialysis     Past Surgical History:  Procedure Laterality Date  . EXTERNAL FIXATION LEG Right 06/08/2018   Procedure: EXTERNAL FIXATION COMMINUTED FIBULA FRACTURE;  Surgeon: Netta Cedars, MD;  Location: [facility];  Service: Orthopedics;  Laterality: Right;  . EXTERNAL FIXATION REMOVAL Right 06/11/2018   Procedure: REMOVAL EXTERNAL FIXATION LEG;  Surgeon: Shona Needles, MD;   Location: [facility];  Service: Orthopedics;  Laterality: Right;  . I & D EXTREMITY Right 06/08/2018   Procedure: IRRIGATION AND DEBRIDEMENT OPEN TIBIA FRACTURE;  Surgeon: Netta Cedars, MD;  Location: [facility];  Service: Orthopedics;  Laterality: Right;  . I & D EXTREMITY Right 06/11/2018   Procedure: IRRIGATION AND DEBRIDEMENT EXTREMITY;  Surgeon: Shona Needles, MD;  Location: [facility];  Service: Orthopedics;  Laterality: Right;  . NOSE SURGERY    . ORIF ANKLE FRACTURE Right 06/08/2018   Procedure: OPEN REDUCTION INTERNAL FIXATION (ORIF) MEDIAL MALLEOLUS;  Surgeon: Netta Cedars, MD;  Location: [facility];  Service: Orthopedics;  Laterality: Right;  . ORIF ANKLE FRACTURE Right 06/11/2018   Procedure: OPEN REDUCTION INTERNAL FIXATION (ORIF) ANKLE FRACTURE;  Surgeon: Shona Needles, MD;  Location: [facility];  Service: Orthopedics;  Laterality: Right;  . TIBIA DEBRIDEMENT Right 06/08/2018    Prior to Admission medications   Medication Sig Start Date End Date Taking? Authorizing Provider  amiodarone (PACERONE) 200 MG tablet Take 200 mg by mouth daily. 10/01/20  Yes [provider]  ELIQUIS 2.5 MG TABS tablet Take 2.5 mg by mouth 2 (two) times daily. 10/01/20  Yes [provider]  metoprolol succinate (TOPROL-XL) 25 MG 24 hr tablet Take 25 mg by mouth daily. 10/09/20  Yes [provider]  midodrine (PROAMATINE) 2.5 MG tablet Take 1 tablet by mouth 3 (three) times daily. 10/02/20  Yes [provider]  ondansetron (ZOFRAN) 4 MG tablet Take by mouth. 10/09/20  Yes [provider]  simvastatin (ZOCOR) 20  MG tablet Take 20 mg by mouth every evening. 04/30/18  Yes [provider]    Current Facility-Administered Medications  Medication Dose Route Frequency Provider Last Rate Last Admin  . amiodarone (PACERONE) tablet 200 mg  200 mg Oral Daily Elgergawy, Silver Huguenin, MD      . apixaban (ELIQUIS) tablet 2.5 mg  2.5 mg Oral BID Elgergawy, Silver Huguenin, MD   2.5 mg at 10/12/20 2130  .  Chlorhexidine Gluconate Cloth 2 % PADS 6 each  6 each Topical Q0600 Justin Mend, MD      . ciprofloxacin (CIPRO) tablet 500 mg  500 mg Oral Daily Omeka Holben K, DO      . diphenoxylate-atropine (LOMOTIL) 2.5-0.025 MG per tablet 1 tablet  1 tablet Oral QID PRN Elgergawy, Silver Huguenin, MD      . metroNIDAZOLE (FLAGYL) tablet 500 mg  500 mg Oral Q8H Janes Colegrove K, DO      . midodrine (PROAMATINE) tablet 5 mg  5 mg Oral TID Elgergawy, Silver Huguenin, MD   5 mg at 10/13/20 0007  . simvastatin (ZOCOR) tablet 20 mg  20 mg Oral QPM Elgergawy, Silver Huguenin, MD   20 mg at 10/12/20 2127  . sodium chloride flush (NS) 0.9 % injection 3 mL  3 mL Intravenous Q12H Elgergawy, Silver Huguenin, MD       Current Outpatient Medications  Medication Sig Dispense Refill  . amiodarone (PACERONE) 200 MG tablet Take 200 mg by mouth daily.    Marland Kitchen ELIQUIS 2.5 MG TABS tablet Take 2.5 mg by mouth 2 (two) times daily.    . metoprolol succinate (TOPROL-XL) 25 MG 24 hr tablet Take 25 mg by mouth daily.    . midodrine (PROAMATINE) 2.5 MG tablet Take 1 tablet by mouth 3 (three) times daily.    . ondansetron (ZOFRAN) 4 MG tablet Take by mouth.    . simvastatin (ZOCOR) 20 MG tablet Take 20 mg by mouth every evening.  0    Allergies as of 10/12/2020  . (No Known Allergies)    No family history on file.  Social History   Socioeconomic History  . Marital status: Single    Spouse name: Not on file  . Number of children: Not on file  . Years of education: Not on file  . Highest education level: Not on file  Occupational History  . Not on file  Tobacco Use  . Smoking status: Never Smoker  . Smokeless tobacco: Never Used  Vaping Use  . Vaping Use: Never used  Substance and Sexual Activity  . Alcohol use: Yes    Comment: occasional  . Drug use: Never  . Sexual activity: Not on file  Other Topics Concern  . Not on file  Social History Narrative   ** Merged History Encounter **       Social Determinants of Health    Financial Resource Strain:   . Difficulty of Paying Living Expenses: Not on file  Food Insecurity:   . Worried About Charity fundraiser in the Last Year: Not on file  . Ran Out of Food in the Last Year: Not on file  Transportation Needs:   . Lack of Transportation (Medical): Not on file  . Lack of Transportation (Non-Medical): Not on file  Physical Activity:   . Days of Exercise per Week: Not on file  . Minutes of Exercise per Session: Not on file  Stress:   . Feeling of Stress : Not on file  Social Connections:   . Frequency of Communication with Friends and Family: Not on file  . Frequency of Social Gatherings with Friends and Family: Not on file  . Attends Religious Services: Not on file  . Active Member of Clubs or Organizations: Not on file  . Attends Archivist Meetings: Not on file  . Marital Status: Not on file  Intimate Partner Violence:   . Fear of Current or Ex-Partner: Not on file  . Emotionally Abused: Not on file  . Physically Abused: Not on file  . Sexually Abused: Not on file    Review of Systems: General: Negative for anorexia, weight loss, fever, chills, fatigue, weakness. Eyes: Negative for vision changes.  ENT: Negative for hoarseness, difficulty swallowing , nasal congestion. CV: Negative for chest pain, angina, palpitations, dyspnea on exertion, peripheral edema.  Respiratory: Negative for dyspnea at rest, dyspnea on exertion, cough, sputum, wheezing.  GI: See history of present illness. GU:  Negative for dysuria, hematuria, urinary incontinence, urinary frequency, nocturnal urination.  MS: Negative for joint pain, low back pain.  Derm: Negative for rash or itching.  Neuro: Negative for weakness, abnormal sensation, seizure, frequent headaches, memory loss, confusion.  Psych: Negative for anxiety, depression, suicidal ideation, hallucinations.  Endo: Negative for unusual weight change.  Heme: Negative for bruising or bleeding. Allergy:  Negative for rash or hives.  Physical Exam: Vital signs in last 24 hours: Temp:  [98.1 F (36.7 C)-98.7 F (37.1 C)] 98.7 F (37.1 C) (11/13 1129) Pulse Rate:  [65-92] 81 (11/13 1200) Resp:  [13-23] 14 (11/13 1200) BP: (110-149)/(60-78) 125/71 (11/13 1200) SpO2:  [92 %-99 %] 95 % (11/13 1200) Weight:  [72.6 kg] 72.6 kg (11/12 1317)   General:   Alert,  Well-developed, well-nourished, pleasant and cooperative in NAD Head:  Normocephalic and atraumatic. Eyes:  Sclera clear, no icterus.   Conjunctiva pink. Ears:  Normal auditory acuity. Nose:  No deformity, discharge,  or lesions. Mouth:  No deformity or lesions, dentition normal. Neck:  Supple; no masses or thyromegaly. Lungs:  Clear throughout to auscultation.   No wheezes, crackles, or rhonchi. No acute distress. Heart:  Regular rate and rhythm; no murmurs, clicks, rubs,  or gallops. Abdomen:  Soft, nontender and nondistended. No masses, hepatosplenomegaly or hernias noted. Normal bowel sounds, without guarding, and without rebound.   Rectal:  Deferred until time of colonoscopy.   Msk:  Symmetrical without gross deformities. Normal posture. Pulses:  Normal pulses noted. Extremities:  Without clubbing or edema. Neurologic:  Alert and  oriented x4;  grossly normal neurologically. Skin:  Intact without significant lesions or rashes. Cervical Nodes:  No significant cervical adenopathy. Psych:  Alert and cooperative. Normal mood and affect.  Intake/Output from previous day: 11/12 0701 - 11/13 0700 In: 1000 [IV Piggyback:1000] Out: 500 [Urine:500] Intake/Output this shift: No intake/output data recorded.  Lab Results: Recent Labs    10/12/20 1344 10/13/20 0434  WBC 8.3 6.0  HGB 8.2* 7.8*  HCT 27.3* 26.4*  PLT 577* 573*   BMET Recent Labs    10/12/20 1344 10/13/20 0434  NA 131* 131*  K 3.4* 3.3*  CL 97* 99  CO2 23 22  GLUCOSE 152* 95  BUN 41* 45*  CREATININE 5.49* 5.33*  CALCIUM 7.2* 7.2*   LFT Recent Labs     10/12/20 1348  PROT 5.1*  ALBUMIN 1.9*  AST 14*  ALT 15  ALKPHOS 74  BILITOT 0.6  BILIDIR 0.2  IBILI 0.4   PT/INR No results  for input(s): LABPROT, INR in the last 72 hours. Hepatitis Panel No results for input(s): HEPBSAG, HCVAB, HEPAIGM, HEPBIGM in the last 72 hours. C-Diff No results for input(s): CDIFFTOX in the last 72 hours.  Studies/Results: CT Abdomen Pelvis Wo Contrast  Result Date: 10/12/2020 CLINICAL DATA:  15 pound weight loss in 2 weeks. Nausea and vomiting. EXAM: CT ABDOMEN AND PELVIS WITHOUT CONTRAST TECHNIQUE: Multidetector CT imaging of the abdomen and pelvis was performed following the standard protocol without IV contrast. COMPARISON:  06/13/2018 FINDINGS: Lower chest: Small bilateral pleural effusions. Contrast medium in the distal esophagus suggesting dysmotility or reflux. Low-density blood pool suggests anemia. Hepatobiliary: Dependent gallstones in the gallbladder. Trace perihepatic ascites. Pancreas: Punctate calcifications in the pancreas likely reflecting chronic calcific pancreatitis. Spleen: Unremarkable Adrenals/Urinary Tract: There about 8 nonobstructive right renal calculi measuring up to 0.5 cm in diameter. There are about 13 nonobstructive left renal calculi measuring up to 0.6 cm in diameter. No hydronephrosis, hydroureter, or ureteral calculus identified. Adrenal glands unremarkable. Stomach/Bowel: Rectal wall thickening noted with redundant dilated sigmoid colon extending up into the upper abdomen, but without overt volvulus. There is wall thickening in the descending colon. Gas-filled dilated transverse colon. Mild segmental wall thickening in the ascending colon. There is edema in the sigmoid colon mesentery and to a lesser extent in the central bowel mesentery normal appendix. No pneumatosis. Vascular/Lymphatic: Aortoiliac atherosclerotic vascular disease. Reproductive: Mild prostatomegaly. Other: Presacral edema. Mild scattered ascites. Mesenteric edema  especially along the sigmoid mesentery. Musculoskeletal: Degenerative disc disease at L2-3 and L5-S1 with endplate irregularity and endplate sclerosis especially at L2-3. This has worsened compared to 06/13/2018. IMPRESSION: 1. Rectal wall thickening with redundant dilated sigmoid colon extending up into the upper abdomen, but without overt volvulus. There is also wall thickening in the descending colon, ascending colon, and sigmoid colon. There is edema in the sigmoid colon mesentery and to a lesser extent in the central bowel mesentery. The appearance is nonspecific but could be due to colitis, inflammatory bowel disease or inflammatory bowel disease; a component of functional colonic abnormalities not excluded given the moderately dilated appearance. 2. Small bilateral pleural effusions. 3. Low-density blood pool suggests anemia. 4. Cholelithiasis. 5. Bilateral nonobstructive nephrolithiasis. 6. Mild prostatomegaly. 7. Degenerative disc disease at L2-3 and L5-S1 with endplate irregularity and endplate sclerosis especially at L2-3. This has worsened compared to 06/13/2018. 8. Contrast medium in the distal esophagus suggesting dysmotility or reflux. 9. Aortic atherosclerosis. Aortic Atherosclerosis (ICD10-I70.0). Electronically Signed   By: Van Clines M.D.   On: 10/12/2020 19:10   DG Chest 2 View  Result Date: 10/12/2020 CLINICAL DATA:  Near-syncope at dialysis. EXAM: CHEST - 2 VIEW COMPARISON:  09/24/2020 FINDINGS: A new right jugular dialysis catheter terminates over the lower SVC. The cardiomediastinal silhouette is unchanged with normal heart size. There are small pleural effusions with mild posterior basilar airspace opacity. The lungs are otherwise clear. No edema or pneumothorax is identified. No acute osseous abnormality is seen. There is persistent gaseous distension of likely large bowel loops in the upper abdomen, also present on the prior study and incompletely evaluated on these chest  radiographs. IMPRESSION: 1. Small bilateral pleural effusions with mild basilar atelectasis. 2. Persistent gaseous distension of colon in the upper abdomen, incompletely evaluated. Electronically Signed   By: Logan Bores M.D.   On: 10/12/2020 14:39    Impression: *Colitis-etiology unclear *Diarrhea due to above *Hypovolemia with near-syncope *Anemia of chronic disease  Plan: Etiology of patient's colitis unclear, differential includes infectious versus  underlying inflammatory bowel disease such as ulcerative colitis or Crohn's colitis.  Given his significant hypovolemia and ongoing diarrhea, I am going to start him on antibiotics with ciprofloxacin (once daily given ESRD) and metronidazole.  Check stool pathogen panel as well as rule out C. difficile infection.  Patient will likely need colonoscopy to further evaluate his colitis.  Pending clinical course, we can hopefully do this in the outpatient setting once he is recovered from his acute episode.  Thank you for letting us take part in this patient's care, GI to continue to follow.  Elon Alas. Abbey Chatters, D.O. Gastroenterology and Hepatology [facility]    LOS: 0 days     10/13/2020, 12:15 PM

## 2020-10-13 NOTE — Progress Notes (Signed)
  Echocardiogram 2D Echocardiogram has been performed.  [name] 10/13/2020, 9:22 AM

## 2020-10-13 NOTE — ED Notes (Signed)
Pt states that he had some gas, checked pt, no bm noted, changed depends, pt has area of redness and wound on his sacrum, dressing placed on sacrum.  Pt states that he is ready to go upstairs.

## 2020-10-13 NOTE — Consult Note (Signed)
[facility]  INPATIENT CONSULTATION  Reason for Consultation: ESRD Requesting Provider: Dr. Wynetta Emery  HPI: [name] is an 69 y.o. male with DM, ESRD on HD who is seen for evaluation and management of ESRD and assoc conditions.   Pt reports longstanding CKD 5 stable 2-3y with GFR 12; had renal biopsy sounds like arterionephrosclerosis.  About 2 weeks go admitted to hospital in [place] with acute onset emesis he thought was gastroenteritis.  Ended up starting HD via [facility] there and has been on HD locally at [facility] this week.  Mon had syncope at end of tx, came to [facility] ED and had NS boluses then discharged.  Wed had HD ok.  Yesterday on arrival to [place] went to stand to go in for tx and had near syncope.  At [facility] ED ++ orthostatic with SBP in to 60s.  GIven volume resuscitation and admitted.  Imaging with possible colitis. GI has consulted - started antibiotics and evaluation.   Pt just arrived to floor.  Orthostatics remain positive and he reports dry mouth.  Has lost 10-12 lbs in the 2 weeks since this started.  Otherwise no preceding health changes.  RIJ TDC has been working well.    TTE done with results pending.  No diabetic neuropathy, no cardiac history.  Normal UOP.  No diarrhea x 1-2 days now.  Previously was profuse and watery. Nonbloody.  PMH: Past Medical History:  Diagnosis Date  . CKD (chronic kidney disease), stage V ([place])   . Diabetes mellitus without complication ([place])   . Diabetes mellitus, type 2 ([place])   . History of hemodialysis    PSH: Past Surgical History:  Procedure Laterality Date  . EXTERNAL FIXATION LEG Right 06/08/2018   Procedure: EXTERNAL FIXATION COMMINUTED FIBULA FRACTURE;  Surgeon: Netta Cedars, MD;  Location: [place];  Service: Orthopedics;  Laterality: Right;  . EXTERNAL FIXATION REMOVAL Right 06/11/2018   Procedure: REMOVAL EXTERNAL FIXATION LEG;  Surgeon: Shona Needles, MD;  Location: [place];  Service: Orthopedics;  Laterality: Right;  . I & D  EXTREMITY Right 06/08/2018   Procedure: IRRIGATION AND DEBRIDEMENT OPEN TIBIA FRACTURE;  Surgeon: Netta Cedars, MD;  Location: [place];  Service: Orthopedics;  Laterality: Right;  . I & D EXTREMITY Right 06/11/2018   Procedure: IRRIGATION AND DEBRIDEMENT EXTREMITY;  Surgeon: Shona Needles, MD;  Location: [place];  Service: Orthopedics;  Laterality: Right;  . NOSE SURGERY    . ORIF ANKLE FRACTURE Right 06/08/2018   Procedure: OPEN REDUCTION INTERNAL FIXATION (ORIF) MEDIAL MALLEOLUS;  Surgeon: Netta Cedars, MD;  Location: [place];  Service: Orthopedics;  Laterality: Right;  . ORIF ANKLE FRACTURE Right 06/11/2018   Procedure: OPEN REDUCTION INTERNAL FIXATION (ORIF) ANKLE FRACTURE;  Surgeon: Shona Needles, MD;  Location: [place];  Service: Orthopedics;  Laterality: Right;  . TIBIA DEBRIDEMENT Right 06/08/2018    Past Medical History:  Diagnosis Date  . CKD (chronic kidney disease), stage V ([place])   . Diabetes mellitus without complication ([place])   . Diabetes mellitus, type 2 ([place])   . History of hemodialysis     Medications:  I have reviewed the patient's current medications.  (Not in a hospital admission)   ALLERGIES:  No Known Allergies  FAM HX: No family history on file.  Social History:   reports that he has never smoked. He has never used smokeless tobacco. He reports current alcohol use. He reports that he does not use drugs.  ROS: 12 system ROS  neg except per HPI  Blood pressure 131/71, pulse 70, temperature 98.5 F (36.9 C), temperature source Oral, resp. rate 18, height 6\' 2"  (1.88 m), weight 72.6 kg, SpO2 94 %. PHYSICAL EXAM: Gen: nontoxic appearing, comfortable sitting in bed  Eyes: anicteric ENT: MM tacky, tongue not enlarged Neck: no JVD CV: RRR Abd:  Soft, nontender Lungs: clear to bases GU: no foley Extr:  No edema Neuro: conversant Skin: warm and dry   Results for orders placed or performed during the hospital encounter of 10/12/20 (from the past 48 hour(s))   Basic metabolic panel     Status: Abnormal   Collection Time: 10/12/20  1:44 PM  Result Value Ref Range   Sodium 131 (L) 135 - 145 mmol/L   Potassium 3.4 (L) 3.5 - 5.1 mmol/L   Chloride 97 (L) 98 - 111 mmol/L   CO2 23 22 - 32 mmol/L   Glucose, Bld 152 (H) 70 - 99 mg/dL    Comment: Glucose reference range applies only to samples taken after fasting for at least 8 hours.   BUN 41 (H) 8 - 23 mg/dL   Creatinine, Ser 5.49 (H) 0.61 - 1.24 mg/dL   Calcium 7.2 (L) 8.9 - 10.3 mg/dL   GFR, Estimated 10 (L) >60 mL/min    Comment: (NOTE) Calculated using the CKD-EPI Creatinine Equation (2021)    Anion gap 11 5 - 15    Comment: Performed at [facility], [address] [place], [place] [address]  CBC     Status: Abnormal   Collection Time: 10/12/20  1:44 PM  Result Value Ref Range   WBC 8.3 4.0 - 10.5 K/uL   RBC 2.66 (L) 4.22 - 5.81 MIL/uL   Hemoglobin 8.2 (L) 13.0 - 17.0 g/dL   HCT 27.3 (L) 39 - 52 %   MCV 102.6 (H) 80.0 - 100.0 fL   MCH 30.8 26.0 - 34.0 pg   MCHC 30.0 30.0 - 36.0 g/dL   RDW 13.3 11.5 - 15.5 %   Platelets 577 (H) 150 - 400 K/uL   nRBC 0.0 0.0 - 0.2 %    Comment: Performed at [facility], [address] [place], [place] [address]  CBG monitoring, ED     Status: Abnormal   Collection Time: 10/12/20  1:44 PM  Result Value Ref Range   Glucose-Capillary 139 (H) 70 - 99 mg/dL    Comment: Glucose reference range applies only to samples taken after fasting for at least 8 hours.  Hepatic function panel     Status: Abnormal   Collection Time: 10/12/20  1:48 PM  Result Value Ref Range   Total Protein 5.1 (L) 6.5 - 8.1 g/dL   Albumin 1.9 (L) 3.5 - 5.0 g/dL   AST 14 (L) 15 - 41 U/L   ALT 15 0 - 44 U/L   Alkaline Phosphatase 74 38 - 126 U/L   Total Bilirubin 0.6 0.3 - 1.2 mg/dL   Bilirubin, Direct 0.2 0.0 - 0.2 mg/dL   Indirect Bilirubin 0.4 0.3 - 0.9 mg/dL    Comment: Performed at [facility], [address], [place], [place] [address]  Lipase, blood     Status: None    Collection Time: 10/12/20  1:48 PM  Result Value Ref Range   Lipase 20 11 - 51 U/L    Comment: Performed at [facility], [address], [place], [place] [address]  TSH     Status: None   Collection Time: 10/12/20  1:48 PM  Result Value Ref Range   TSH 1.912 0.350 - 4.500 uIU/mL    Comment: Performed by a 3rd Generation assay with a functional sensitivity of <=0.01 uIU/mL. Performed at [facility], [address] [place], [place] [address]   Respiratory Panel by RT PCR (Flu A&B, Covid) - Nasopharyngeal Swab     Status: None   Collection Time: 10/12/20  3:13 PM   Specimen: Nasopharyngeal Swab  Result Value Ref Range   SARS Coronavirus 2 by RT PCR NEGATIVE NEGATIVE    Comment: (NOTE) SARS-CoV-2 target nucleic acids are NOT DETECTED.  The SARS-CoV-2 RNA is generally detectable in upper respiratoy specimens during the acute phase of infection. The lowest concentration of SARS-CoV-2 viral copies this assay can detect is 131 copies/mL. A negative result does not preclude SARS-Cov-2 infection and should not be used as the sole basis for treatment or other patient management decisions. A negative result may occur with  improper specimen collection/handling, submission of specimen other than nasopharyngeal swab, presence of viral mutation(s) within the areas targeted by this assay, and inadequate number of viral copies (<131 copies/mL). A negative result must be combined with clinical observations, patient history, and epidemiological information. The expected result is Negative.  Fact Sheet for Patients:  PinkCheek.be  Fact Sheet for Healthcare Providers:  GravelBags.it  This test is no t yet approved or cleared by the [place] FDA and  has been authorized for detection and/or diagnosis of SARS-CoV-2 by FDA under an Emergency Use Authorization (EUA). This EUA will remain  in effect (meaning this test can be used) for the  duration of the COVID-19 declaration under Section 564(b)(1) of the Act, 21 U.S.C. section 360bbb-3(b)(1), unless the authorization is terminated or revoked sooner.     Influenza A by PCR NEGATIVE NEGATIVE   Influenza B by PCR NEGATIVE NEGATIVE    Comment: (NOTE) The Xpert Xpress SARS-CoV-2/FLU/RSV assay is intended as an aid in  the diagnosis of influenza from Nasopharyngeal swab specimens and  should not be used as a sole basis for treatment. Nasal washings and  aspirates are unacceptable for Xpert Xpress SARS-CoV-2/FLU/RSV  testing.  Fact Sheet for Patients: PinkCheek.be  Fact Sheet for Healthcare Providers: GravelBags.it  This test is not yet approved or cleared by the [place] FDA and  has been authorized for detection and/or diagnosis of SARS-CoV-2 by  FDA under an Emergency Use Authorization (EUA). This EUA will remain  in effect (meaning this test can be used) for the duration of the  Covid-19 declaration under Section 564(b)(1) of the Act, 21  U.S.C. section 360bbb-3(b)(1), unless the authorization is  terminated or revoked. Performed at [facility], [address] [place], [place] [address]   Urinalysis, Routine w reflex microscopic Urine, Random     Status: Abnormal   Collection Time: 10/12/20  4:22 PM  Result Value Ref Range   Color, Urine YELLOW YELLOW   APPearance CLEAR CLEAR   Specific Gravity, Urine 1.008 1.005 - 1.030   pH 8.0 5.0 - 8.0   Glucose, UA >=500 (A) NEGATIVE mg/dL   Hgb urine dipstick NEGATIVE NEGATIVE   Bilirubin Urine NEGATIVE NEGATIVE   Ketones, ur NEGATIVE NEGATIVE mg/dL   Protein, ur 100 (A) NEGATIVE mg/dL   Nitrite NEGATIVE NEGATIVE   Leukocytes,Ua NEGATIVE NEGATIVE   RBC / HPF 0-5 0 - 5 RBC/hpf   WBC, UA 0-5 0 - 5 WBC/hpf   Bacteria, UA RARE (A) NONE SEEN   Squamous Epithelial / LPF 0-5 0 -  5   Sperm, UA PRESENT     Comment: Performed at [facility], [address],  [place], [place] [address]  Blood gas, arterial (at [facility])     Status: None   Collection Time: 10/12/20  4:43 PM  Result Value Ref Range   FIO2 21.00    pH, Arterial 7.425 7.35 - 7.45   pCO2 arterial 34.3 32 - 48 mmHg   pO2, Arterial 84.1 83 - 108 mmHg   Bicarbonate 23.1 20.0 - 28.0 mmol/L   Acid-base deficit 1.6 0.0 - 2.0 mmol/L   O2 Saturation 93.1 %   Patient temperature 36.9    Allens test (pass/fail) PASS PASS    Comment: Performed at [facility], [address] [place], [place] [address]  Basic metabolic panel     Status: Abnormal   Collection Time: 10/13/20  4:34 AM  Result Value Ref Range   Sodium 131 (L) 135 - 145 mmol/L   Potassium 3.3 (L) 3.5 - 5.1 mmol/L   Chloride 99 98 - 111 mmol/L   CO2 22 22 - 32 mmol/L   Glucose, Bld 95 70 - 99 mg/dL    Comment: Glucose reference range applies only to samples taken after fasting for at least 8 hours.   BUN 45 (H) 8 - 23 mg/dL   Creatinine, Ser 5.33 (H) 0.61 - 1.24 mg/dL   Calcium 7.2 (L) 8.9 - 10.3 mg/dL   GFR, Estimated 11 (L) >60 mL/min    Comment: (NOTE) Calculated using the CKD-EPI Creatinine Equation (2021)    Anion gap 10 5 - 15    Comment: Performed at [facility], [address], [place], [place] [address]  CBC     Status: Abnormal   Collection Time: 10/13/20  4:34 AM  Result Value Ref Range   WBC 6.0 4.0 - 10.5 K/uL   RBC 2.63 (L) 4.22 - 5.81 MIL/uL   Hemoglobin 7.8 (L) 13.0 - 17.0 g/dL   HCT 26.4 (L) 39 - 52 %   MCV 100.4 (H) 80.0 - 100.0 fL   MCH 29.7 26.0 - 34.0 pg   MCHC 29.5 (L) 30.0 - 36.0 g/dL   RDW 13.5 11.5 - 15.5 %   Platelets 573 (H) 150 - 400 K/uL   nRBC 0.0 0.0 - 0.2 %    Comment: Performed at [facility], [address] [place], [place] [address]  CBG monitoring, ED     Status: None   Collection Time: 10/13/20  6:35 AM  Result Value Ref Range   Glucose-Capillary 88 70 - 99 mg/dL    Comment: Glucose reference range applies only to samples taken after fasting for at least 8 hours.    CT Abdomen  Pelvis Wo Contrast  Result Date: 10/12/2020 CLINICAL DATA:  15 pound weight loss in 2 weeks. Nausea and vomiting. EXAM: CT ABDOMEN AND PELVIS WITHOUT CONTRAST TECHNIQUE: Multidetector CT imaging of the abdomen and pelvis was performed following the standard protocol without IV contrast. COMPARISON:  06/13/2018 FINDINGS: Lower chest: Small bilateral pleural effusions. Contrast medium in the distal esophagus suggesting dysmotility or reflux. Low-density blood pool suggests anemia. Hepatobiliary: Dependent gallstones in the gallbladder. Trace perihepatic ascites. Pancreas: Punctate calcifications in the pancreas likely reflecting chronic calcific pancreatitis. Spleen: Unremarkable Adrenals/Urinary Tract: There about 8 nonobstructive right renal calculi measuring up to 0.5 cm in diameter. There are about 13 nonobstructive left renal calculi measuring up to 0.6 cm in diameter. No hydronephrosis, hydroureter, or ureteral calculus identified. Adrenal glands unremarkable.  Stomach/Bowel: Rectal wall thickening noted with redundant dilated sigmoid colon extending up into the upper abdomen, but without overt volvulus. There is wall thickening in the descending colon. Gas-filled dilated transverse colon. Mild segmental wall thickening in the ascending colon. There is edema in the sigmoid colon mesentery and to a lesser extent in the central bowel mesentery normal appendix. No pneumatosis. Vascular/Lymphatic: Aortoiliac atherosclerotic vascular disease. Reproductive: Mild prostatomegaly. Other: Presacral edema. Mild scattered ascites. Mesenteric edema especially along the sigmoid mesentery. Musculoskeletal: Degenerative disc disease at L2-3 and L5-S1 with endplate irregularity and endplate sclerosis especially at L2-3. This has worsened compared to 06/13/2018. IMPRESSION: 1. Rectal wall thickening with redundant dilated sigmoid colon extending up into the upper abdomen, but without overt volvulus. There is also wall  thickening in the descending colon, ascending colon, and sigmoid colon. There is edema in the sigmoid colon mesentery and to a lesser extent in the central bowel mesentery. The appearance is nonspecific but could be due to colitis, inflammatory bowel disease or inflammatory bowel disease; a component of functional colonic abnormalities not excluded given the moderately dilated appearance. 2. Small bilateral pleural effusions. 3. Low-density blood pool suggests anemia. 4. Cholelithiasis. 5. Bilateral nonobstructive nephrolithiasis. 6. Mild prostatomegaly. 7. Degenerative disc disease at L2-3 and L5-S1 with endplate irregularity and endplate sclerosis especially at L2-3. This has worsened compared to 06/13/2018. 8. Contrast medium in the distal esophagus suggesting dysmotility or reflux. 9. Aortic atherosclerosis. Aortic Atherosclerosis (ICD10-I70.0). Electronically Signed   By: Van Clines M.D.   On: 10/12/2020 19:10   DG Chest 2 View  Result Date: 10/12/2020 CLINICAL DATA:  Near-syncope at dialysis. EXAM: CHEST - 2 VIEW COMPARISON:  09/24/2020 FINDINGS: A new right jugular dialysis catheter terminates over the lower SVC. The cardiomediastinal silhouette is unchanged with normal heart size. There are small pleural effusions with mild posterior basilar airspace opacity. The lungs are otherwise clear. No edema or pneumothorax is identified. No acute osseous abnormality is seen. There is persistent gaseous distension of likely large bowel loops in the upper abdomen, also present on the prior study and incompletely evaluated on these chest radiographs. IMPRESSION: 1. Small bilateral pleural effusions with mild basilar atelectasis. 2. Persistent gaseous distension of colon in the upper abdomen, incompletely evaluated. Electronically Signed   By: Logan Bores M.D.   On: 10/12/2020 14:39   HD orders:  Not currently available.  Assessment/Plan **Syncope/hypotension:  Appears most likely hypovolemia driven.  Still orthostatic - d/w hospitalist give NS 517mL bolus now and start MIVF today.  No UF with HD.  TTE pending.  On tele.  **ESRD on HD: MWF outpt, make up today for missed tx yesterday.  No UF.  Had appt with [facility] for AVF last week - he will reschedule.   **Hypokalemia: Use 4K dialysate today.  **A fib:  On amiodarone and eliquis.  BB on hold.   **Anemia:  Suspect related to ESRD.  Hb 7.8, no current indications for transfusion.   Check iron indices.  May need to start ESA here.    **Malnutrition: albumin 1.9, start protein supplements.   **BMM: check phos here.   Page with concerns.   Justin Mend 10/13/2020, 8:34 AM

## 2020-10-14 DIAGNOSIS — R197 Diarrhea, unspecified: Secondary | ICD-10-CM | POA: Diagnosis not present

## 2020-10-14 DIAGNOSIS — E860 Dehydration: Secondary | ICD-10-CM | POA: Diagnosis not present

## 2020-10-14 DIAGNOSIS — D649 Anemia, unspecified: Secondary | ICD-10-CM | POA: Diagnosis not present

## 2020-10-14 DIAGNOSIS — N186 End stage renal disease: Secondary | ICD-10-CM | POA: Diagnosis not present

## 2020-10-14 DIAGNOSIS — K529 Noninfective gastroenteritis and colitis, unspecified: Secondary | ICD-10-CM | POA: Diagnosis not present

## 2020-10-14 LAB — CBC
HCT: 22.8 % — ABNORMAL LOW (ref 39.0–52.0)
Hemoglobin: 6.9 g/dL — CL (ref 13.0–17.0)
MCH: 30.3 pg (ref 26.0–34.0)
MCHC: 30.3 g/dL (ref 30.0–36.0)
MCV: 100 fL (ref 80.0–100.0)
Platelets: 511 10*3/uL — ABNORMAL HIGH (ref 150–400)
RBC: 2.28 MIL/uL — ABNORMAL LOW (ref 4.22–5.81)
RDW: 13.4 % (ref 11.5–15.5)
WBC: 5.1 10*3/uL (ref 4.0–10.5)
nRBC: 0 % (ref 0.0–0.2)

## 2020-10-14 LAB — RENAL FUNCTION PANEL
Albumin: 2 g/dL — ABNORMAL LOW (ref 3.5–5.0)
Anion gap: 10 (ref 5–15)
BUN: 25 mg/dL — ABNORMAL HIGH (ref 8–23)
CO2: 23 mmol/L (ref 22–32)
Calcium: 7 mg/dL — ABNORMAL LOW (ref 8.9–10.3)
Chloride: 99 mmol/L (ref 98–111)
Creatinine, Ser: 3.18 mg/dL — ABNORMAL HIGH (ref 0.61–1.24)
GFR, Estimated: 20 mL/min — ABNORMAL LOW (ref >60)
Glucose, Bld: 88 mg/dL (ref 70–99)
Phosphorus: 3 mg/dL (ref 2.5–4.6)
Potassium: 3.6 mmol/L (ref 3.5–5.1)
Sodium: 132 mmol/L — ABNORMAL LOW (ref 135–145)

## 2020-10-14 LAB — GLUCOSE, CAPILLARY
Glucose-Capillary: 88 mg/dL (ref 70–99)
Glucose-Capillary: 88 mg/dL (ref 70–99)

## 2020-10-14 LAB — IRON AND TIBC
Iron: 11 ug/dL — ABNORMAL LOW (ref 45–182)
Saturation Ratios: 10 % — ABNORMAL LOW (ref 17.9–39.5)
TIBC: 107 ug/dL — ABNORMAL LOW (ref 250–450)
UIBC: 96 ug/dL

## 2020-10-14 LAB — ALBUMIN: Albumin: 2 g/dL — ABNORMAL LOW (ref 3.5–5.0)

## 2020-10-14 LAB — C DIFFICILE QUICK SCREEN W PCR REFLEX
C Diff antigen: NEGATIVE
C Diff interpretation: NOT DETECTED
C Diff toxin: NEGATIVE

## 2020-10-14 LAB — ABO/RH: ABO/RH(D): A POS

## 2020-10-14 LAB — PREPARE RBC (CROSSMATCH)

## 2020-10-14 LAB — FERRITIN: Ferritin: 463 ng/mL — ABNORMAL HIGH (ref 24–336)

## 2020-10-14 LAB — HEPATITIS B SURFACE ANTIGEN: Hepatitis B Surface Ag: NONREACTIVE

## 2020-10-14 MED ORDER — ALUM & MAG HYDROXIDE-SIMETH 200-200-20 MG/5ML PO SUSP
30.0000 mL | ORAL | Status: DC | PRN
  Administered 2020-10-14: 30 mL via ORAL
  Filled 2020-10-14: qty 30

## 2020-10-14 MED ORDER — SODIUM CHLORIDE 0.9% IV SOLUTION: Freq: Once | INTRAVENOUS | Status: AC

## 2020-10-14 NOTE — Progress Notes (Signed)
Subjective: Patient without complaints this morning.  Tolerating diet without any issues.  No abdominal pain.  We have been unable to collect a stool sample for infectious testing at this juncture.  Patient denies any blood in stools.  Hemoglobin did drop to 6.9 and is current receiving 1 unit PRBCs.  Likely component of hemodilution given fluids.  Objective: Vital signs in last 24 hours: Temp:  [98.2 F (36.8 C)-99.3 F (37.4 C)] 98.3 F (36.8 C) (11/14 1143) Pulse Rate:  [81-105] 85 (11/14 1143) Resp:  [16-19] 18 (11/14 1143) BP: (81-140)/(57-80) 140/67 (11/14 1143) SpO2:  [95 %-100 %] 95 % (11/14 1143) Weight:  [74.1 kg-74.5 kg] 74.1 kg (11/14 0204) Last BM Date: 10/14/20 General:   Alert and oriented, pleasant Head:  Normocephalic and atraumatic. Eyes:  No icterus, sclera clear. Conjuctiva pink.  Mouth:  Without lesions, mucosa pink and moist.  Neck:  Supple, without thyromegaly or masses.  Heart:  S1, S2 present, no murmurs noted.  Lungs: Clear to auscultation bilaterally, without wheezing, rales, or rhonchi.  Abdomen:  Bowel sounds present, soft, non-tender, non-distended. No HSM or hernias noted. No rebound or guarding. No masses appreciated  Msk:  Symmetrical without gross deformities. Normal posture. Pulses:  Normal pulses noted. Extremities:  Without clubbing or edema. Neurologic:  Alert and  oriented x4;  grossly normal neurologically. Skin:  Warm and dry, intact without significant lesions.  Cervical Nodes:  No significant cervical adenopathy. Psych:  Alert and cooperative. Normal mood and affect.  Intake/Output from previous day: 11/13 0701 - 11/14 0700 In: 1250 [I.V.:750; IV Piggyback:500] Out: 138 [Emesis/NG output:400] Intake/Output this shift: Total I/O In: -  Out: 200 [Urine:200]  Lab Results: Recent Labs    10/12/20 1344 10/13/20 0434 10/14/20 0550  WBC 8.3 6.0 5.1  HGB 8.2* 7.8* 6.9*  HCT 27.3* 26.4* 22.8*  PLT 577* 573* 511*   BMET Recent Labs     10/12/20 1344 10/13/20 0434 10/14/20 0550  NA 131* 131* 132*  K 3.4* 3.3* 3.6  CL 97* 99 99  CO2 23 22 23   GLUCOSE 152* 95 88  BUN 41* 45* 25*  CREATININE 5.49* 5.33* 3.18*  CALCIUM 7.2* 7.2* 7.0*   LFT Recent Labs    10/12/20 1348 10/14/20 0550  PROT 5.1*  --   ALBUMIN 1.9* 2.0*  2.0*  AST 14*  --   ALT 15  --   ALKPHOS 74  --   BILITOT 0.6  --   BILIDIR 0.2  --   IBILI 0.4  --    PT/INR No results for input(s): LABPROT, INR in the last 72 hours. Hepatitis Panel No results for input(s): HEPBSAG, HCVAB, HEPAIGM, HEPBIGM in the last 72 hours.   Studies/Results: CT Abdomen Pelvis Wo Contrast  Result Date: 10/12/2020 CLINICAL DATA:  15 pound weight loss in 2 weeks. Nausea and vomiting. EXAM: CT ABDOMEN AND PELVIS WITHOUT CONTRAST TECHNIQUE: Multidetector CT imaging of the abdomen and pelvis was performed following the standard protocol without IV contrast. COMPARISON:  06/13/2018 FINDINGS: Lower chest: Small bilateral pleural effusions. Contrast medium in the distal esophagus suggesting dysmotility or reflux. Low-density blood pool suggests anemia. Hepatobiliary: Dependent gallstones in the gallbladder. Trace perihepatic ascites. Pancreas: Punctate calcifications in the pancreas likely reflecting chronic calcific pancreatitis. Spleen: Unremarkable Adrenals/Urinary Tract: There about 8 nonobstructive right renal calculi measuring up to 0.5 cm in diameter. There are about 13 nonobstructive left renal calculi measuring up to 0.6 cm in diameter. No hydronephrosis, hydroureter, or ureteral calculus identified.  Adrenal glands unremarkable. Stomach/Bowel: Rectal wall thickening noted with redundant dilated sigmoid colon extending up into the upper abdomen, but without overt volvulus. There is wall thickening in the descending colon. Gas-filled dilated transverse colon. Mild segmental wall thickening in the ascending colon. There is edema in the sigmoid colon mesentery and to a lesser  extent in the central bowel mesentery normal appendix. No pneumatosis. Vascular/Lymphatic: Aortoiliac atherosclerotic vascular disease. Reproductive: Mild prostatomegaly. Other: Presacral edema. Mild scattered ascites. Mesenteric edema especially along the sigmoid mesentery. Musculoskeletal: Degenerative disc disease at L2-3 and L5-S1 with endplate irregularity and endplate sclerosis especially at L2-3. This has worsened compared to 06/13/2018. IMPRESSION: 1. Rectal wall thickening with redundant dilated sigmoid colon extending up into the upper abdomen, but without overt volvulus. There is also wall thickening in the descending colon, ascending colon, and sigmoid colon. There is edema in the sigmoid colon mesentery and to a lesser extent in the central bowel mesentery. The appearance is nonspecific but could be due to colitis, inflammatory bowel disease or inflammatory bowel disease; a component of functional colonic abnormalities not excluded given the moderately dilated appearance. 2. Small bilateral pleural effusions. 3. Low-density blood pool suggests anemia. 4. Cholelithiasis. 5. Bilateral nonobstructive nephrolithiasis. 6. Mild prostatomegaly. 7. Degenerative disc disease at L2-3 and L5-S1 with endplate irregularity and endplate sclerosis especially at L2-3. This has worsened compared to 06/13/2018. 8. Contrast medium in the distal esophagus suggesting dysmotility or reflux. 9. Aortic atherosclerosis. Aortic Atherosclerosis (ICD10-I70.0). Electronically Signed   By: Van Clines M.D.   On: 10/12/2020 19:10   DG Chest 2 View  Result Date: 10/12/2020 CLINICAL DATA:  Near-syncope at dialysis. EXAM: CHEST - 2 VIEW COMPARISON:  09/24/2020 FINDINGS: A new right jugular dialysis catheter terminates over the lower SVC. The cardiomediastinal silhouette is unchanged with normal heart size. There are small pleural effusions with mild posterior basilar airspace opacity. The lungs are otherwise clear. No edema  or pneumothorax is identified. No acute osseous abnormality is seen. There is persistent gaseous distension of likely large bowel loops in the upper abdomen, also present on the prior study and incompletely evaluated on these chest radiographs. IMPRESSION: 1. Small bilateral pleural effusions with mild basilar atelectasis. 2. Persistent gaseous distension of colon in the upper abdomen, incompletely evaluated. Electronically Signed   By: Logan Bores M.D.   On: 10/12/2020 14:39   ECHOCARDIOGRAM COMPLETE  Result Date: 10/13/2020    ECHOCARDIOGRAM REPORT   Patient Name:   [name] Date of Exam: 10/13/2020 Medical Rec #:  [id number]   Height:       74.0 in Accession #:    [id number]  Weight:       160.0 lb Date of Birth:  Mar 11, 1951   BSA:          1.977 m Patient Age:    34 years    BP:           133/73 mmHg Patient Gender: M           HR:           74 bpm. Exam Location:  [facility] Procedure: 2D Echo, Cardiac Doppler and Color Doppler Indications:    Syncope  History:        Patient has no prior history of Echocardiogram examinations.                 Arrythmias:Atrial Fibrillation, Signs/Symptoms:Syncope; Risk  Factors:Dyslipidemia. ESRD.  Sonographer:    Dustin Flock RDCS Referring Phys: [place]  1. Left ventricular ejection fraction, by estimation, is 60 to 65%. The left ventricle has normal function. The left ventricle has no regional wall motion abnormalities. Left ventricular diastolic parameters are consistent with Grade I diastolic dysfunction (impaired relaxation). Elevated left atrial pressure.  2. Right ventricular systolic function is normal. The right ventricular size is normal. There is normal pulmonary artery systolic pressure.  3. The mitral valve is normal in structure. No evidence of mitral valve regurgitation. No evidence of mitral stenosis.  4. The aortic valve is normal in structure. Aortic valve regurgitation is not visualized. No aortic  stenosis is present.  5. There is Moderate (Grade III) protruding plaque involving the transverse aorta.  6. The inferior vena cava is normal in size with greater than 50% respiratory variability, suggesting right atrial pressure of 3 mmHg. FINDINGS  Left Ventricle: Left ventricular ejection fraction, by estimation, is 60 to 65%. The left ventricle has normal function. The left ventricle has no regional wall motion abnormalities. The left ventricular internal cavity size was normal in size. There is  no left ventricular hypertrophy. Left ventricular diastolic parameters are consistent with Grade I diastolic dysfunction (impaired relaxation). Elevated left atrial pressure. Right Ventricle: The right ventricular size is normal. No increase in right ventricular wall thickness. Right ventricular systolic function is normal. There is normal pulmonary artery systolic pressure. The tricuspid regurgitant velocity is 2.55 m/s, and  with an assumed right atrial pressure of 3 mmHg, the estimated right ventricular systolic pressure is 00.9 mmHg. Left Atrium: Left atrial size was normal in size. Right Atrium: Right atrial size was normal in size. Prominent Eustachian valve. Pericardium: There is no evidence of pericardial effusion. Mitral Valve: The mitral valve is normal in structure. No evidence of mitral valve regurgitation. No evidence of mitral valve stenosis. Tricuspid Valve: The tricuspid valve is normal in structure. Tricuspid valve regurgitation is trivial. No evidence of tricuspid stenosis. Aortic Valve: The aortic valve is normal in structure. Aortic valve regurgitation is not visualized. No aortic stenosis is present. Pulmonic Valve: The pulmonic valve was normal in structure. Pulmonic valve regurgitation is not visualized. No evidence of pulmonic stenosis. Aorta: The aortic root is normal in size and structure. There is moderate (Grade III) protruding plaque involving the transverse aorta. Venous: The inferior vena  cava is normal in size with greater than 50% respiratory variability, suggesting right atrial pressure of 3 mmHg. IAS/Shunts: No atrial level shunt detected by color flow Doppler.  LEFT VENTRICLE PLAX 2D LVIDd:         4.96 cm  Diastology LVIDs:         3.66 cm  LV e' medial:    7.29 cm/s LV PW:         1.16 cm  LV E/e' medial:  9.7 LV IVS:        1.17 cm  LV e' lateral:   8.38 cm/s LVOT diam:     2.50 cm  LV E/e' lateral: 8.4 LV SV:         101 LV SV Index:   51 LVOT Area:     4.91 cm  RIGHT VENTRICLE RV Basal diam:  3.10 cm RV S prime:     8.70 cm/s TAPSE (M-mode): 4.2 cm LEFT ATRIUM             Index       RIGHT ATRIUM  Index LA diam:        3.80 cm 1.92 cm/m  RA Area:     19.40 cm LA Vol (A2C):   49.9 ml 25.24 ml/m RA Volume:   53.30 ml  26.96 ml/m LA Vol (A4C):   50.8 ml 25.70 ml/m LA Biplane Vol: 53.0 ml 26.81 ml/m  AORTIC VALVE LVOT Vmax:   99.20 cm/s LVOT Vmean:  67.300 cm/s LVOT VTI:    0.206 m  AORTA Ao Root diam: 3.50 cm MITRAL VALVE               TRICUSPID VALVE MV Area (PHT): 3.66 cm    TR Peak grad:   26.0 mmHg MV Decel Time: 207 msec    TR Vmax:        255.00 cm/s MV E velocity: 70.60 cm/s MV A velocity: 98.50 cm/s  SHUNTS MV E/A ratio:  0.72        Systemic VTI:  0.21 m                            Systemic Diam: 2.50 cm Mihai Croitoru MD Electronically signed by Sanda Klein MD Signature Date/Time: 10/13/2020/1:20:39 PM    Final     Assessment: *Colitis-etiology unclear *Diarrhea due to above *Hypovolemia with near syncope-resolved *Acute on chronic anemia  Plan: Etiology of patient's colitis unclear, differential includes infectious versus underlying inflammatory bowel disease such as ulcerative colitis or Crohn's colitis.  Given his significant hypovolemia and ongoing diarrhea, patient was started on on antibiotics with ciprofloxacin (once daily given ESRD) and metronidazole 10/13/2020.  We are still awaiting stool collection to check stool pathogen panel as well  as rule out C. difficile infection.  Worsening anemia likely has a component of hemodilution as he is received 1 L of IV fluids.  Patient denies any rectal bleeding.  Continue to monitor hemoglobin.  Patient will likely need colonoscopy to further evaluate his colitis.  Pending clinical course, we can hopefully do this in the outpatient setting once he is recovered from his acute episode.  Thank you for letting us take part in this patient's care, GI to continue to follow.  Elon Alas. Abbey Chatters, D.O. Gastroenterology and Hepatology [facility]   LOS: 1 day    10/14/2020, 12:05 PM

## 2020-10-14 NOTE — Progress Notes (Signed)
PROGRESS NOTE  [name]  ZOX:[id number] DOB: March 16, 1951 DOA: 10/12/2020 PCP: Patient, No Pcp Per   Chief Complaint  Patient presents with  . Near Syncope   Brief Admission History:  69 y.o. male, with past medical history of diabetes mellitus, CKD stage V, recent hospitalization at [facility] for which he started hemodialysis for ESRD on MWF schedule, and diagnosed with A. fib when he started on amiodarone, metoprolol and Eliquis, patient was sent from hemodialysis center for near syncope and orthostasis, patient reports he was discharged last week from [place], he is on hemodialysis center since Monday, he does report syncope on Monday after dialysis, as well he does report orthostasis and near syncope for last couple days which he was started on midodrine by his nephrologist, report this morning prior to dialysis he felt dizzy and lightheaded where he was hypotensive for which they sent him to ED, patient report he is still making good amount of urine daily, reports his weight 2 weeks ago was 29.2 kg, and during most dialysis session was at 76, as well he does report diarrhea x2 weeks, he denies fever, chills or abdominal pain, he does report episodes of vomiting on Wednesday, but no recurrence, overall he reports good oral intake and fluid consumption.  Patient was noted to be orthostatic127/67 supine, 71/48 standing with no improvement despite receiving 500 cc x 2 bolus, Significant for potassium of 3.2, sodium of 131, nine 8.2, CT abdomen pelvis significant for wall thickening in the rectal area, with redundant dilated sigmoid colon, but no overt volvulus, so [facility] hospitalist consulted to admit.  Assessment & Plan:   Active Problems:   Dehydration   ESRD (end stage renal disease) (HCC)   Unspecified atrial fibrillation (HCC)   Syncope   Orthostatic hypotension   Chronic diarrhea   Colitis presumed infectious   Pressure injury of skin  1. Orthostatic hypotension - from  intravascular volume depletion, he has ongoing diarrhea from unspecified colitis and needs IV fluids to keep up with his maintenance needs.  IV bolus 500cc ordered and maintenance IV fluids ordered. I discussed with nephrologist Dr. Johnney Ou.   2. ESRD - recently started HD.  Tolerated HD treatment with no problems.  HD 11/15.  Per nephrology can dc.  3. Chronic diarrhea - secondary to unspecified colitis. He is volume depleted.  GI consultation requested.  4. Colitis presumed infectious - C diff and stool studies and Gi started on antibiotics.  Further recommendations to follow.  Appreciate GI follow up.  Waiting for stool studies to be collected.  5. Paroxysmal atrial fibrillation - apixaban and amiodarone, temporarily holding metoprol in setting of orthostatic hypotension.  6. Hyperlipidemia - resumed home statin therapy.  7. Recurrent syncope - secondary to orthostatic hypotension, repeat Echocardiogram with no large effusion seen.   Treating dehydration and addressing chronic diarrhea.  8. Anemia of CKD - Hg down to 6.9, transfusing 1 unit PRBC recheck in CBC in AM.  9. Thrombocytosis - following.  10. Hypokalemia/hyponatremia - Addressed with HD treatment. 11. Hypocalcemia - corrected for low albumin.     DVT prophylaxis: apixaban Code Status: full  Family Communication: t/c to Maudie Mercury, no answer unable to leave message 11/13, 11/14 Disposition: Home   Status is: Inpatient  Remains inpatient appropriate because:IV treatments appropriate due to intensity of illness or inability to take PO and Inpatient level of care appropriate due to severity of illness  Dispo: The patient is from: Home  Anticipated d/c is to: Home              Anticipated d/c date is: 2 days              Patient currently is not medically stable to d/c.  Consultants:   Nephrology  Gastroenterology  Procedures:   ESRD  2D Echo 11/13  IMPRESSIONS  1. Left ventricular ejection fraction, by  estimation, is 60 to 65%. The left ventricle has normal function. The left ventricle has no regional wall motion abnormalities. Left ventricular diastolic parameters are consistent with Grade I diastolic dysfunction (impaired relaxation). Elevated left atrial pressure.  2. Right ventricular systolic function is normal. The right ventricular size is normal. There is normal pulmonary artery systolic pressure.  3. The mitral valve is normal in structure. No evidence of mitral valve regurgitation. No evidence of mitral stenosis.  4. The aortic valve is normal in structure. Aortic valve regurgitation is not visualized. No aortic stenosis is present.  5. There is Moderate (Grade III) protruding plaque involving the transverse aorta.  6. The inferior vena cava is normal in size with greater than 50% respiratory variability, suggesting right atrial pressure of 3 mmHg.    Antimicrobials:  Ciprofloxacin 11/13 >> Metronidazole 11/13>>   Subjective: Pt reports that he feels weak and he is afraid that he will pass out if he start ambulating like he has done in the past. He continues to have loose stools.  He is not eating and drinking very well.   Objective: Vitals:   10/14/20 1143 10/14/20 1425 10/14/20 1430 10/14/20 1455  BP: 140/67 139/71 139/71 131/73  Pulse: 85 77 77 77  Resp: 18 18 18 17   Temp: 98.3 F (36.8 C) 98 F (36.7 C) 98 F (36.7 C) 98.2 F (36.8 C)  TempSrc: Oral Oral Oral Oral  SpO2: 95% 96% 96% 95%  Weight:      Height:        Intake/Output Summary (Last 24 hours) at 10/14/2020 1631 Last data filed at 10/14/2020 1500 Gross per 24 hour  Intake 2331.17 ml  Output 738 ml  Net 1593.17 ml   Filed Weights   10/13/20 1252 10/13/20 2120 10/14/20 0204  Weight: 74.5 kg 74.1 kg 74.1 kg    Examination:  General exam: Appears calm and comfortable  Respiratory system: Clear to auscultation. Respiratory effort normal. Cardiovascular system: normal S1 & S2 heard, RRR. No JVD,  murmurs, rubs, gallops or clicks. No pedal edema. Gastrointestinal system: Abdomen is nondistended, soft and nontender. No organomegaly or masses felt. Normal bowel sounds heard. Central nervous system: Alert and oriented. No focal neurological deficits. Extremities: Symmetric 5 x 5 power. Skin: No rashes, lesions or ulcers Psychiatry: Judgement and insight appear normal. Flat affect.   Data Reviewed: I have personally reviewed following labs and imaging studies  CBC: Recent Labs  Lab 10/12/20 1344 10/13/20 0434 10/14/20 0550  WBC 8.3 6.0 5.1  HGB 8.2* 7.8* 6.9*  HCT 27.3* 26.4* 22.8*  MCV 102.6* 100.4* 100.0  PLT 577* 573* 511*    Basic Metabolic Panel: Recent Labs  Lab 10/12/20 1344 10/13/20 0434 10/13/20 1452 10/14/20 0550  NA 131* 131*  --  132*  K 3.4* 3.3*  --  3.6  CL 97* 99  --  99  CO2 23 22  --  23  GLUCOSE 152* 95  --  88  BUN 41* 45*  --  25*  CREATININE 5.49* 5.33*  --  3.18*  CALCIUM 7.2* 7.2*  --  7.0*  PHOS  --   --  4.3 3.0    GFR: Estimated Creatinine Clearance: 22.7 mL/min (A) (by C-G formula based on SCr of 3.18 mg/dL (H)).  Liver Function Tests: Recent Labs  Lab 10/12/20 1348 10/14/20 0550  AST 14*  --   ALT 15  --   ALKPHOS 74  --   BILITOT 0.6  --   PROT 5.1*  --   ALBUMIN 1.9* 2.0*  2.0*    CBG: Recent Labs  Lab 10/12/20 1344 10/13/20 0635 10/14/20 0535 10/14/20 0737  GLUCAP 139* 88 88 88    Recent Results (from the past 240 hour(s))  Respiratory Panel by RT PCR (Flu A&B, Covid) - Nasopharyngeal Swab     Status: None   Collection Time: 10/12/20  3:13 PM   Specimen: Nasopharyngeal Swab  Result Value Ref Range Status   SARS Coronavirus 2 by RT PCR NEGATIVE NEGATIVE Final    Comment: (NOTE) SARS-CoV-2 target nucleic acids are NOT DETECTED.  The SARS-CoV-2 RNA is generally detectable in upper respiratoy specimens during the acute phase of infection. The lowest concentration of SARS-CoV-2 viral copies this assay can  detect is 131 copies/mL. A negative result does not preclude SARS-Cov-2 infection and should not be used as the sole basis for treatment or other patient management decisions. A negative result may occur with  improper specimen collection/handling, submission of specimen other than nasopharyngeal swab, presence of viral mutation(s) within the areas targeted by this assay, and inadequate number of viral copies (<131 copies/mL). A negative result must be combined with clinical observations, patient history, and epidemiological information. The expected result is Negative.  Fact Sheet for Patients:  PinkCheek.be  Fact Sheet for Healthcare Providers:  GravelBags.it  This test is no t yet approved or cleared by the [place] FDA and  has been authorized for detection and/or diagnosis of SARS-CoV-2 by FDA under an Emergency Use Authorization (EUA). This EUA will remain  in effect (meaning this test can be used) for the duration of the COVID-19 declaration under Section 564(b)(1) of the Act, 21 U.S.C. section 360bbb-3(b)(1), unless the authorization is terminated or revoked sooner.     Influenza A by PCR NEGATIVE NEGATIVE Final   Influenza B by PCR NEGATIVE NEGATIVE Final    Comment: (NOTE) The Xpert Xpress SARS-CoV-2/FLU/RSV assay is intended as an aid in  the diagnosis of influenza from Nasopharyngeal swab specimens and  should not be used as a sole basis for treatment. Nasal washings and  aspirates are unacceptable for Xpert Xpress SARS-CoV-2/FLU/RSV  testing.  Fact Sheet for Patients: PinkCheek.be  Fact Sheet for Healthcare Providers: GravelBags.it  This test is not yet approved or cleared by the [place] FDA and  has been authorized for detection and/or diagnosis of SARS-CoV-2 by  FDA under an Emergency Use Authorization (EUA). This EUA will remain  in  effect (meaning this test can be used) for the duration of the  Covid-19 declaration under Section 564(b)(1) of the Act, 21  U.S.C. section 360bbb-3(b)(1), unless the authorization is  terminated or revoked. Performed at [facility], [address], [place], [place] [address]   MRSA PCR Screening     Status: None   Collection Time: 10/13/20  2:35 PM   Specimen: Nasopharyngeal  Result Value Ref Range Status   MRSA by PCR NEGATIVE NEGATIVE Final    Comment:        The GeneXpert MRSA Assay (FDA approved for NASAL specimens only), is one component of  a comprehensive MRSA colonization surveillance program. It is not intended to diagnose MRSA infection nor to guide or monitor treatment for MRSA infections. Performed at [facility], [address] [place], [place] [address]   C Difficile Quick Screen w PCR reflex     Status: None   Collection Time: 10/14/20  2:15 PM   Specimen: Stool  Result Value Ref Range Status   C Diff antigen NEGATIVE NEGATIVE Final   C Diff toxin NEGATIVE NEGATIVE Final   C Diff interpretation No C. difficile detected.  Final    Comment: Performed at [facility], [address] [place], [place] [address]     Radiology Studies: CT Abdomen Pelvis Wo Contrast  Result Date: 10/12/2020 CLINICAL DATA:  15 pound weight loss in 2 weeks. Nausea and vomiting. EXAM: CT ABDOMEN AND PELVIS WITHOUT CONTRAST TECHNIQUE: Multidetector CT imaging of the abdomen and pelvis was performed following the standard protocol without IV contrast. COMPARISON:  06/13/2018 FINDINGS: Lower chest: Small bilateral pleural effusions. Contrast medium in the distal esophagus suggesting dysmotility or reflux. Low-density blood pool suggests anemia. Hepatobiliary: Dependent gallstones in the gallbladder. Trace perihepatic ascites. Pancreas: Punctate calcifications in the pancreas likely reflecting chronic calcific pancreatitis. Spleen: Unremarkable Adrenals/Urinary Tract: There about 8  nonobstructive right renal calculi measuring up to 0.5 cm in diameter. There are about 13 nonobstructive left renal calculi measuring up to 0.6 cm in diameter. No hydronephrosis, hydroureter, or ureteral calculus identified. Adrenal glands unremarkable. Stomach/Bowel: Rectal wall thickening noted with redundant dilated sigmoid colon extending up into the upper abdomen, but without overt volvulus. There is wall thickening in the descending colon. Gas-filled dilated transverse colon. Mild segmental wall thickening in the ascending colon. There is edema in the sigmoid colon mesentery and to a lesser extent in the central bowel mesentery normal appendix. No pneumatosis. Vascular/Lymphatic: Aortoiliac atherosclerotic vascular disease. Reproductive: Mild prostatomegaly. Other: Presacral edema. Mild scattered ascites. Mesenteric edema especially along the sigmoid mesentery. Musculoskeletal: Degenerative disc disease at L2-3 and L5-S1 with endplate irregularity and endplate sclerosis especially at L2-3. This has worsened compared to 06/13/2018. IMPRESSION: 1. Rectal wall thickening with redundant dilated sigmoid colon extending up into the upper abdomen, but without overt volvulus. There is also wall thickening in the descending colon, ascending colon, and sigmoid colon. There is edema in the sigmoid colon mesentery and to a lesser extent in the central bowel mesentery. The appearance is nonspecific but could be due to colitis, inflammatory bowel disease or inflammatory bowel disease; a component of functional colonic abnormalities not excluded given the moderately dilated appearance. 2. Small bilateral pleural effusions. 3. Low-density blood pool suggests anemia. 4. Cholelithiasis. 5. Bilateral nonobstructive nephrolithiasis. 6. Mild prostatomegaly. 7. Degenerative disc disease at L2-3 and L5-S1 with endplate irregularity and endplate sclerosis especially at L2-3. This has worsened compared to 06/13/2018. 8. Contrast  medium in the distal esophagus suggesting dysmotility or reflux. 9. Aortic atherosclerosis. Aortic Atherosclerosis (ICD10-I70.0). Electronically Signed   By: Van Clines M.D.   On: 10/12/2020 19:10   ECHOCARDIOGRAM COMPLETE  Result Date: 10/13/2020    ECHOCARDIOGRAM REPORT   Patient Name:   AYLEN RAMBERT Date of Exam: 10/13/2020 Medical Rec #:  [id number]   Height:       74.0 in Accession #:    [id number]  Weight:       160.0 lb Date of Birth:  1951/04/02   BSA:          1.977 m Patient Age:    28 years  BP:           133/73 mmHg Patient Gender: M           HR:           74 bpm. Exam Location:  [facility] Procedure: 2D Echo, Cardiac Doppler and Color Doppler Indications:    Syncope  History:        Patient has no prior history of Echocardiogram examinations.                 Arrythmias:Atrial Fibrillation, Signs/Symptoms:Syncope; Risk                 Factors:Dyslipidemia. ESRD.  Sonographer:    Dustin Flock RDCS Referring Phys: [place]  1. Left ventricular ejection fraction, by estimation, is 60 to 65%. The left ventricle has normal function. The left ventricle has no regional wall motion abnormalities. Left ventricular diastolic parameters are consistent with Grade I diastolic dysfunction (impaired relaxation). Elevated left atrial pressure.  2. Right ventricular systolic function is normal. The right ventricular size is normal. There is normal pulmonary artery systolic pressure.  3. The mitral valve is normal in structure. No evidence of mitral valve regurgitation. No evidence of mitral stenosis.  4. The aortic valve is normal in structure. Aortic valve regurgitation is not visualized. No aortic stenosis is present.  5. There is Moderate (Grade III) protruding plaque involving the transverse aorta.  6. The inferior vena cava is normal in size with greater than 50% respiratory variability, suggesting right atrial pressure of 3 mmHg. FINDINGS  Left Ventricle: Left  ventricular ejection fraction, by estimation, is 60 to 65%. The left ventricle has normal function. The left ventricle has no regional wall motion abnormalities. The left ventricular internal cavity size was normal in size. There is  no left ventricular hypertrophy. Left ventricular diastolic parameters are consistent with Grade I diastolic dysfunction (impaired relaxation). Elevated left atrial pressure. Right Ventricle: The right ventricular size is normal. No increase in right ventricular wall thickness. Right ventricular systolic function is normal. There is normal pulmonary artery systolic pressure. The tricuspid regurgitant velocity is 2.55 m/s, and  with an assumed right atrial pressure of 3 mmHg, the estimated right ventricular systolic pressure is 70.3 mmHg. Left Atrium: Left atrial size was normal in size. Right Atrium: Right atrial size was normal in size. Prominent Eustachian valve. Pericardium: There is no evidence of pericardial effusion. Mitral Valve: The mitral valve is normal in structure. No evidence of mitral valve regurgitation. No evidence of mitral valve stenosis. Tricuspid Valve: The tricuspid valve is normal in structure. Tricuspid valve regurgitation is trivial. No evidence of tricuspid stenosis. Aortic Valve: The aortic valve is normal in structure. Aortic valve regurgitation is not visualized. No aortic stenosis is present. Pulmonic Valve: The pulmonic valve was normal in structure. Pulmonic valve regurgitation is not visualized. No evidence of pulmonic stenosis. Aorta: The aortic root is normal in size and structure. There is moderate (Grade III) protruding plaque involving the transverse aorta. Venous: The inferior vena cava is normal in size with greater than 50% respiratory variability, suggesting right atrial pressure of 3 mmHg. IAS/Shunts: No atrial level shunt detected by color flow Doppler.  LEFT VENTRICLE PLAX 2D LVIDd:         4.96 cm  Diastology LVIDs:         3.66 cm  LV e'  medial:    7.29 cm/s LV PW:         1.16 cm  LV E/e' medial:  9.7 LV IVS:        1.17 cm  LV e' lateral:   8.38 cm/s LVOT diam:     2.50 cm  LV E/e' lateral: 8.4 LV SV:         101 LV SV Index:   51 LVOT Area:     4.91 cm  RIGHT VENTRICLE RV Basal diam:  3.10 cm RV S prime:     8.70 cm/s TAPSE (M-mode): 4.2 cm LEFT ATRIUM             Index       RIGHT ATRIUM           Index LA diam:        3.80 cm 1.92 cm/m  RA Area:     19.40 cm LA Vol (A2C):   49.9 ml 25.24 ml/m RA Volume:   53.30 ml  26.96 ml/m LA Vol (A4C):   50.8 ml 25.70 ml/m LA Biplane Vol: 53.0 ml 26.81 ml/m  AORTIC VALVE LVOT Vmax:   99.20 cm/s LVOT Vmean:  67.300 cm/s LVOT VTI:    0.206 m  AORTA Ao Root diam: 3.50 cm MITRAL VALVE               TRICUSPID VALVE MV Area (PHT): 3.66 cm    TR Peak grad:   26.0 mmHg MV Decel Time: 207 msec    TR Vmax:        255.00 cm/s MV E velocity: 70.60 cm/s MV A velocity: 98.50 cm/s  SHUNTS MV E/A ratio:  0.72        Systemic VTI:  0.21 m                            Systemic Diam: 2.50 cm Dani Gobble Croitoru MD Electronically signed by Sanda Klein MD Signature Date/Time: 10/13/2020/1:20:39 PM    Final    Scheduled Meds: . amiodarone  200 mg Oral Daily  . apixaban  2.5 mg Oral BID  . Chlorhexidine Gluconate Cloth  6 each Topical Q0600  . ciprofloxacin  500 mg Oral Daily  . feeding supplement (NEPRO CARB STEADY)  237 mL Oral BID BM  . metroNIDAZOLE  500 mg Oral Q8H  . midodrine  5 mg Oral TID  . simvastatin  20 mg Oral QPM  . sodium chloride flush  3 mL Intravenous Q12H   Continuous Infusions: . sodium chloride    . sodium chloride    . sodium chloride 50 mL/hr at 10/13/20 1954    LOS: 1 day   Time spent: 31 mins  Edon Hoadley Wynetta Emery, MD How to contact the [facility] Attending or Consulting provider [place] or covering provider during after hours [place], for this patient?  1. Check the care team in [facility] and look for a) attending/consulting [facility] provider listed and b) the [facility] team listed 2. Log into  [email or web address] and use [place]'s universal password to access. If you do not have the password, please contact the hospital operator. 3. Locate the [facility] provider you are looking for under [facility] and page to a number that you can be directly reached. 4. If you still have difficulty reaching the provider, please page the [facility] (Director on Call) for the Hospitalists listed on amion for assistance.  10/14/2020, 4:31 PM

## 2020-10-14 NOTE — Progress Notes (Signed)
CRITICAL VALUE ALERT  Critical Value: 6.9  Date & Time Notied:  10/14/2020  0840  Provider Notified: Dr. Wynetta Emery  Orders Received/Actions taken: yes

## 2020-10-14 NOTE — Progress Notes (Signed)
[facility] Progress Note   Subjective:   Feeling ok but he hasn't been up or had orthostatics checked. HD went fine yesterday.  Net + 1.4L for admission. pRBC this AM with Hb in the 6s, no active bleeding noted.    Objective Vitals:   10/14/20 0204 10/14/20 0535 10/14/20 1115 10/14/20 1143  BP:  134/77 132/68 140/67  Pulse:  88 88 85  Resp:  19 18 18   Temp:   98.2 F (36.8 C) 98.3 F (36.8 C)  TempSrc:   Oral Oral  SpO2:  95%  95%  Weight: 74.1 kg     Height:       Physical Exam Gen: nontoxic appearing, comfortable sitting in bed  Eyes: anicteric ENT: MM tacky, tongue not enlarged Neck: no JVD CV: RRR Abd:  Soft, nontender Lungs: clear to bases GU: no foley Extr:  No edema Neuro: conversant Skin: warm and dry  Additional Objective Labs: Basic Metabolic Panel: Recent Labs  Lab 10/12/20 1344 10/13/20 0434 10/13/20 1452 10/14/20 0550  NA 131* 131*  --  132*  K 3.4* 3.3*  --  3.6  CL 97* 99  --  99  CO2 23 22  --  23  GLUCOSE 152* 95  --  88  BUN 41* 45*  --  25*  CREATININE 5.49* 5.33*  --  3.18*  CALCIUM 7.2* 7.2*  --  7.0*  PHOS  --   --  4.3 3.0   Liver Function Tests: Recent Labs  Lab 10/12/20 1348 10/14/20 0550  AST 14*  --   ALT 15  --   ALKPHOS 74  --   BILITOT 0.6  --   PROT 5.1*  --   ALBUMIN 1.9* 2.0*  2.0*   Recent Labs  Lab 10/12/20 1348  LIPASE 20   CBC: Recent Labs  Lab 10/12/20 1344 10/13/20 0434 10/14/20 0550  WBC 8.3 6.0 5.1  HGB 8.2* 7.8* 6.9*  HCT 27.3* 26.4* 22.8*  MCV 102.6* 100.4* 100.0  PLT 577* 573* 511*   Blood Culture No results found for: SDES, SPECREQUEST, CULT, REPTSTATUS  Cardiac Enzymes: No results for input(s): CKTOTAL, CKMB, CKMBINDEX, TROPONINI in the last 168 hours. CBG: Recent Labs  Lab 10/12/20 1344 10/13/20 0635 10/14/20 0535 10/14/20 0737  GLUCAP 139* 88 88 88   Iron Studies:  Recent Labs    10/14/20 0550  IRON 11*  TIBC 107*  FERRITIN 463*    @lablastinr3 @ Studies/Results: CT Abdomen Pelvis Wo Contrast  Result Date: 10/12/2020 CLINICAL DATA:  15 pound weight loss in 2 weeks. Nausea and vomiting. EXAM: CT ABDOMEN AND PELVIS WITHOUT CONTRAST TECHNIQUE: Multidetector CT imaging of the abdomen and pelvis was performed following the standard protocol without IV contrast. COMPARISON:  06/13/2018 FINDINGS: Lower chest: Small bilateral pleural effusions. Contrast medium in the distal esophagus suggesting dysmotility or reflux. Low-density blood pool suggests anemia. Hepatobiliary: Dependent gallstones in the gallbladder. Trace perihepatic ascites. Pancreas: Punctate calcifications in the pancreas likely reflecting chronic calcific pancreatitis. Spleen: Unremarkable Adrenals/Urinary Tract: There about 8 nonobstructive right renal calculi measuring up to 0.5 cm in diameter. There are about 13 nonobstructive left renal calculi measuring up to 0.6 cm in diameter. No hydronephrosis, hydroureter, or ureteral calculus identified. Adrenal glands unremarkable. Stomach/Bowel: Rectal wall thickening noted with redundant dilated sigmoid colon extending up into the upper abdomen, but without overt volvulus. There is wall thickening in the descending colon. Gas-filled dilated transverse colon. Mild segmental wall thickening in the ascending colon. There is  edema in the sigmoid colon mesentery and to a lesser extent in the central bowel mesentery normal appendix. No pneumatosis. Vascular/Lymphatic: Aortoiliac atherosclerotic vascular disease. Reproductive: Mild prostatomegaly. Other: Presacral edema. Mild scattered ascites. Mesenteric edema especially along the sigmoid mesentery. Musculoskeletal: Degenerative disc disease at L2-3 and L5-S1 with endplate irregularity and endplate sclerosis especially at L2-3. This has worsened compared to 06/13/2018. IMPRESSION: 1. Rectal wall thickening with redundant dilated sigmoid colon extending up into the upper abdomen, but  without overt volvulus. There is also wall thickening in the descending colon, ascending colon, and sigmoid colon. There is edema in the sigmoid colon mesentery and to a lesser extent in the central bowel mesentery. The appearance is nonspecific but could be due to colitis, inflammatory bowel disease or inflammatory bowel disease; a component of functional colonic abnormalities not excluded given the moderately dilated appearance. 2. Small bilateral pleural effusions. 3. Low-density blood pool suggests anemia. 4. Cholelithiasis. 5. Bilateral nonobstructive nephrolithiasis. 6. Mild prostatomegaly. 7. Degenerative disc disease at L2-3 and L5-S1 with endplate irregularity and endplate sclerosis especially at L2-3. This has worsened compared to 06/13/2018. 8. Contrast medium in the distal esophagus suggesting dysmotility or reflux. 9. Aortic atherosclerosis. Aortic Atherosclerosis (ICD10-I70.0). Electronically Signed   By: Van Clines M.D.   On: 10/12/2020 19:10   DG Chest 2 View  Result Date: 10/12/2020 CLINICAL DATA:  Near-syncope at dialysis. EXAM: CHEST - 2 VIEW COMPARISON:  09/24/2020 FINDINGS: A new right jugular dialysis catheter terminates over the lower SVC. The cardiomediastinal silhouette is unchanged with normal heart size. There are small pleural effusions with mild posterior basilar airspace opacity. The lungs are otherwise clear. No edema or pneumothorax is identified. No acute osseous abnormality is seen. There is persistent gaseous distension of likely large bowel loops in the upper abdomen, also present on the prior study and incompletely evaluated on these chest radiographs. IMPRESSION: 1. Small bilateral pleural effusions with mild basilar atelectasis. 2. Persistent gaseous distension of colon in the upper abdomen, incompletely evaluated. Electronically Signed   By: Logan Bores M.D.   On: 10/12/2020 14:39   ECHOCARDIOGRAM COMPLETE  Result Date: 10/13/2020    ECHOCARDIOGRAM REPORT    Patient Name:   [name] Date of Exam: 10/13/2020 Medical Rec #:  [id number]   Height:       74.0 in Accession #:    [id number]  Weight:       160.0 lb Date of Birth:  March 29, 1951   BSA:          1.977 m Patient Age:    69 years    BP:           133/73 mmHg Patient Gender: M           HR:           74 bpm. Exam Location:  [facility] Procedure: 2D Echo, Cardiac Doppler and Color Doppler Indications:    Syncope  History:        Patient has no prior history of Echocardiogram examinations.                 Arrythmias:Atrial Fibrillation, Signs/Symptoms:Syncope; Risk                 Factors:Dyslipidemia. ESRD.  Sonographer:    Dustin Flock RDCS Referring Phys: [place]  1. Left ventricular ejection fraction, by estimation, is 60 to 65%. The left ventricle has normal function. The left ventricle has no regional wall motion abnormalities. Left  ventricular diastolic parameters are consistent with Grade I diastolic dysfunction (impaired relaxation). Elevated left atrial pressure.  2. Right ventricular systolic function is normal. The right ventricular size is normal. There is normal pulmonary artery systolic pressure.  3. The mitral valve is normal in structure. No evidence of mitral valve regurgitation. No evidence of mitral stenosis.  4. The aortic valve is normal in structure. Aortic valve regurgitation is not visualized. No aortic stenosis is present.  5. There is Moderate (Grade III) protruding plaque involving the transverse aorta.  6. The inferior vena cava is normal in size with greater than 50% respiratory variability, suggesting right atrial pressure of 3 mmHg. FINDINGS  Left Ventricle: Left ventricular ejection fraction, by estimation, is 60 to 65%. The left ventricle has normal function. The left ventricle has no regional wall motion abnormalities. The left ventricular internal cavity size was normal in size. There is  no left ventricular hypertrophy. Left ventricular diastolic  parameters are consistent with Grade I diastolic dysfunction (impaired relaxation). Elevated left atrial pressure. Right Ventricle: The right ventricular size is normal. No increase in right ventricular wall thickness. Right ventricular systolic function is normal. There is normal pulmonary artery systolic pressure. The tricuspid regurgitant velocity is 2.55 m/s, and  with an assumed right atrial pressure of 3 mmHg, the estimated right ventricular systolic pressure is 05.1 mmHg. Left Atrium: Left atrial size was normal in size. Right Atrium: Right atrial size was normal in size. Prominent Eustachian valve. Pericardium: There is no evidence of pericardial effusion. Mitral Valve: The mitral valve is normal in structure. No evidence of mitral valve regurgitation. No evidence of mitral valve stenosis. Tricuspid Valve: The tricuspid valve is normal in structure. Tricuspid valve regurgitation is trivial. No evidence of tricuspid stenosis. Aortic Valve: The aortic valve is normal in structure. Aortic valve regurgitation is not visualized. No aortic stenosis is present. Pulmonic Valve: The pulmonic valve was normal in structure. Pulmonic valve regurgitation is not visualized. No evidence of pulmonic stenosis. Aorta: The aortic root is normal in size and structure. There is moderate (Grade III) protruding plaque involving the transverse aorta. Venous: The inferior vena cava is normal in size with greater than 50% respiratory variability, suggesting right atrial pressure of 3 mmHg. IAS/Shunts: No atrial level shunt detected by color flow Doppler.  LEFT VENTRICLE PLAX 2D LVIDd:         4.96 cm  Diastology LVIDs:         3.66 cm  LV e' medial:    7.29 cm/s LV PW:         1.16 cm  LV E/e' medial:  9.7 LV IVS:        1.17 cm  LV e' lateral:   8.38 cm/s LVOT diam:     2.50 cm  LV E/e' lateral: 8.4 LV SV:         101 LV SV Index:   51 LVOT Area:     4.91 cm  RIGHT VENTRICLE RV Basal diam:  3.10 cm RV S prime:     8.70 cm/s TAPSE  (M-mode): 4.2 cm LEFT ATRIUM             Index       RIGHT ATRIUM           Index LA diam:        3.80 cm 1.92 cm/m  RA Area:     19.40 cm LA Vol (A2C):   49.9 ml 25.24 ml/m RA Volume:   53.30  ml  26.96 ml/m LA Vol (A4C):   50.8 ml 25.70 ml/m LA Biplane Vol: 53.0 ml 26.81 ml/m  AORTIC VALVE LVOT Vmax:   99.20 cm/s LVOT Vmean:  67.300 cm/s LVOT VTI:    0.206 m  AORTA Ao Root diam: 3.50 cm MITRAL VALVE               TRICUSPID VALVE MV Area (PHT): 3.66 cm    TR Peak grad:   26.0 mmHg MV Decel Time: 207 msec    TR Vmax:        255.00 cm/s MV E velocity: 70.60 cm/s MV A velocity: 98.50 cm/s  SHUNTS MV E/A ratio:  0.72        Systemic VTI:  0.21 m                            Systemic Diam: 2.50 cm Dani Gobble Croitoru MD Electronically signed by Sanda Klein MD Signature Date/Time: 10/13/2020/1:20:39 PM    Final    Medications: . sodium chloride    . sodium chloride    . sodium chloride 50 mL/hr at 10/13/20 1954   . amiodarone  200 mg Oral Daily  . apixaban  2.5 mg Oral BID  . Chlorhexidine Gluconate Cloth  6 each Topical Q0600  . ciprofloxacin  500 mg Oral Daily  . feeding supplement (NEPRO CARB STEADY)  237 mL Oral BID BM  . metroNIDAZOLE  500 mg Oral Q8H  . midodrine  5 mg Oral TID  . simvastatin  20 mg Oral QPM  . sodium chloride flush  3 mL Intravenous Q12H   Assessment/Plan **Syncope/hypotension:  Appears most likely hypovolemia driven and temporally relates to recent diarrheal issue. TTE without effusion.  Has rec'd some volume resuscitation - repeat orthostatics after pRBC transfusion.  On tele.  **Colitis: GI following; being eval for infectious diarrhea and tx with cipro/flagyl, likely will need endoscopy at some point.   **ESRD on HD: MWF outpt, had HD Sat as a makeup for missed Friday.  Had appt with [facility] for [facility] last week - he will reschedule.  Plan next HD tomorrow.   **Hypokalemia: resolved with K 3.6 today.  **A fib:  On amiodarone and eliquis.  BB on hold.   **Anemia:   Suspect related to ESRD.  Hb 7.8 > 6.9, receiving transfusion.   Iron deficient, hold IV iron with possible bacterial infection.  ESA with HD tomorrow.    **Malnutrition: albumin 1.9, start protein supplements.   **BMM: phos and corr ca ok.   Page with concerns.    Jannifer Hick MD 10/14/2020, 12:50 PM  [facility] Pager: [phone]

## 2020-10-15 DIAGNOSIS — N186 End stage renal disease: Secondary | ICD-10-CM | POA: Diagnosis not present

## 2020-10-15 DIAGNOSIS — R197 Diarrhea, unspecified: Secondary | ICD-10-CM | POA: Diagnosis not present

## 2020-10-15 DIAGNOSIS — D649 Anemia, unspecified: Secondary | ICD-10-CM | POA: Diagnosis not present

## 2020-10-15 DIAGNOSIS — K529 Noninfective gastroenteritis and colitis, unspecified: Secondary | ICD-10-CM | POA: Diagnosis not present

## 2020-10-15 DIAGNOSIS — E860 Dehydration: Secondary | ICD-10-CM | POA: Diagnosis not present

## 2020-10-15 LAB — RENAL FUNCTION PANEL
Albumin: 1.8 g/dL — ABNORMAL LOW (ref 3.5–5.0)
Anion gap: 8 (ref 5–15)
BUN: 43 mg/dL — ABNORMAL HIGH (ref 8–23)
CO2: 20 mmol/L — ABNORMAL LOW (ref 22–32)
Calcium: 6.9 mg/dL — ABNORMAL LOW (ref 8.9–10.3)
Chloride: 102 mmol/L (ref 98–111)
Creatinine, Ser: 4.51 mg/dL — ABNORMAL HIGH (ref 0.61–1.24)
GFR, Estimated: 13 mL/min — ABNORMAL LOW (ref >60)
Glucose, Bld: 99 mg/dL (ref 70–99)
Phosphorus: 3.3 mg/dL (ref 2.5–4.6)
Potassium: 3.5 mmol/L (ref 3.5–5.1)
Sodium: 130 mmol/L — ABNORMAL LOW (ref 135–145)

## 2020-10-15 LAB — CBC
HCT: 27 % — ABNORMAL LOW (ref 39.0–52.0)
Hemoglobin: 8.3 g/dL — ABNORMAL LOW (ref 13.0–17.0)
MCH: 29.3 pg (ref 26.0–34.0)
MCHC: 30.7 g/dL (ref 30.0–36.0)
MCV: 95.4 fL (ref 80.0–100.0)
Platelets: 449 10*3/uL — ABNORMAL HIGH (ref 150–400)
RBC: 2.83 MIL/uL — ABNORMAL LOW (ref 4.22–5.81)
RDW: 19.6 % — ABNORMAL HIGH (ref 11.5–15.5)
WBC: 6.9 10*3/uL (ref 4.0–10.5)
nRBC: 0 % (ref 0.0–0.2)

## 2020-10-15 LAB — HEMOGLOBIN AND HEMATOCRIT, BLOOD
HCT: 26 % — ABNORMAL LOW (ref 39.0–52.0)
Hemoglobin: 8.2 g/dL — ABNORMAL LOW (ref 13.0–17.0)

## 2020-10-15 LAB — GLUCOSE, CAPILLARY: Glucose-Capillary: 98 mg/dL (ref 70–99)

## 2020-10-15 MED ORDER — CALCITRIOL 0.25 MCG PO CAPS
0.5000 ug | ORAL_CAPSULE | Freq: Every day | ORAL | Status: DC
  Administered 2020-10-15 – 2020-11-01 (×17): 0.5 ug via ORAL
  Filled 2020-10-15 (×18): qty 2

## 2020-10-15 NOTE — Plan of Care (Signed)

## 2020-10-15 NOTE — Procedures (Signed)
   HEMODIALYSIS TREATMENT NOTE:  Kept even / no fluid removed.  Hemodynamically stable.  Uneventful 3.5 hour session completed.  All blood was returned.  Rockwell Alexandria, RN

## 2020-10-15 NOTE — Evaluation (Signed)
Physical Therapy Evaluation Patient Details Name: [name] MRN: [id number] DOB: May 10, 1951 Today's Date: 10/15/2020   History of Present Illness  [name]  is a 69 y.o. male, with past medical history of diabetes mellitus, CKD stage V, recent hospitalization at [facility] for which he started hemodialysis for ESRD on MWF schedule, and diagnosed with A. fib when he started on amiodarone, metoprolol and Eliquis, patient was sent from hemodialysis center for near syncope and orthostasis, patient reports he was discharged last week from [facility], he is on hemodialysis center since Monday, he does report syncope on Monday after dialysis, as well he does report orthostasis and near syncope for last couple days which he was started on midodrine by his nephrologist, report this morning prior to dialysis he felt dizzy and lightheaded where he was hypotensive for which they sent him to ED, patient report he is still making good amount of urine daily, reports his weight 2 weeks ago was 29.2 kg, and during most dialysis session was at 76, as well he does report diarrhea x2 weeks, he denies fever, chills or abdominal pain, he does report episodes of vomiting on Wednesday, but no recurrence, overall he reports good oral intake and fluid consumption.    Clinical Impression  Patient demonstrates slow labored movement for sitting up at bedside, very unsteady on feet and at high risk for falls, limited to a couple of side steps before having to sit.  Patient's orthostatic BP's as follows: supine 129/70, sitting 79/60, standing 63/44 - RN/MD notified.  Patient put back to bed after.  Patient will benefit from continued physical therapy in hospital and recommended venue below to increase strength, balance, endurance for safe ADLs and gait.     Follow Up Recommendations SNF    Equipment Recommendations  None recommended by PT    Recommendations for Other Services       Precautions / Restrictions  Precautions Precautions: Fall Restrictions Weight Bearing Restrictions: No      Mobility  Bed Mobility Overal bed mobility: Needs Assistance Bed Mobility: Supine to Sit;Sit to Supine     Supine to sit: Min guard Sit to supine: Min guard   General bed mobility comments: increased time, labored movement    Transfers Overall transfer level: Needs assistance Equipment used: Rolling walker (2 wheeled) Transfers: Sit to/from [facility] Sit to Stand: Min assist Stand pivot transfers: Min assist       General transfer comment: increased time, labored movement  Ambulation/Gait Ambulation/Gait assistance: Mod assist Gait Distance (Feet): 3 Feet Assistive device: Rolling walker (2 wheeled) Gait Pattern/deviations: Decreased step length - right;Decreased step length - left;Decreased stride length Gait velocity: slow   General Gait Details: limited to 2-3 slow labored side steps due to weakness, poor standing balance and c/o lightheadedness  Stairs            Wheelchair Mobility    Modified Rankin (Stroke Patients Only)       Balance Overall balance assessment: Needs assistance Sitting-balance support: Feet supported;No upper extremity supported Sitting balance-Leahy Scale: Fair Sitting balance - Comments: fair/good seated at bedside   Standing balance support: During functional activity;No upper extremity supported Standing balance-Leahy Scale: Poor Standing balance comment: fair/poor using RW                             Pertinent Vitals/Pain Pain Assessment: No/denies pain    Home Living Family/patient expects to be discharged to::  Private residence Living Arrangements: Children Available Help at Discharge: Family Type of Home: House Home Access: Stairs to enter Entrance Stairs-Rails: Left Entrance Stairs-Number of Steps: 2-3 in back, 8 steps in front [place]: Two level;Able to live on main level with  bedroom/bathroom;Full bath on main level;Other (Comment) (split level) Home Equipment: Walker - 2 wheels;Cane - single point;Shower seat - built in      Prior Function Level of Independence: Independent         Comments: Hydrographic surveyor, drives     Journalist, newspaper        Extremity/Trunk Assessment   Upper Extremity Assessment Upper Extremity Assessment: Generalized weakness    Lower Extremity Assessment Lower Extremity Assessment: Generalized weakness    Cervical / Trunk Assessment Cervical / Trunk Assessment: Normal  Communication   Communication: No difficulties  Cognition Arousal/Alertness: Awake/alert Behavior During Therapy: WFL for tasks assessed/performed Overall Cognitive Status: Within Functional Limits for tasks assessed                                        General Comments      Exercises     Assessment/Plan    PT Assessment Patient needs continued PT services  PT Problem List Decreased strength;Decreased activity tolerance;Decreased mobility;Decreased balance       PT Treatment Interventions Balance training;Gait training;Stair training;Functional mobility training;DME instruction;Therapeutic activities;Therapeutic exercise;Patient/family education    PT Goals (Current goals can be found in the Care Plan section)  Acute Rehab PT Goals Patient Stated Goal: return home after rehab PT Goal Formulation: With patient Time For Goal Achievement: 10/29/20 Potential to Achieve Goals: Good    Frequency Min 3X/week   Barriers to discharge        Co-evaluation               AM-PAC PT "6 Clicks" Mobility  Outcome Measure Help needed turning from your back to your side while in a flat bed without using bedrails?: None Help needed moving from lying on your back to sitting on the side of a flat bed without using bedrails?: A Little Help needed moving to and from a bed to a chair (including a wheelchair)?: A Lot Help needed  standing up from a chair using your arms (e.g., wheelchair or bedside chair)?: A Lot Help needed to walk in hospital room?: A Lot Help needed climbing 3-5 steps with a railing? : A Lot 6 Click Score: 15    End of Session   Activity Tolerance: Patient tolerated treatment well;Patient limited by fatigue Patient left: in bed;with call bell/phone within reach Nurse Communication: Mobility status PT Visit Diagnosis: Unsteadiness on feet (R26.81);Other abnormalities of gait and mobility (R26.89);Muscle weakness (generalized) (M62.81)    Time: 4917-9150 PT Time Calculation (min) (ACUTE ONLY): 31 min   Charges:   PT Evaluation $PT Eval Moderate Complexity: 1 Mod PT Treatments $Therapeutic Activity: 23-37 mins        12:37 PM, 10/15/20 Lonell Grandchild, MPT Physical Therapist with [facility] 336 [phone] office [phone] mobile phone

## 2020-10-15 NOTE — Progress Notes (Signed)
10/15/2020 12:05 PM  Pt having some rectal bleeding. I spoke with GI team.  Will put a hold on apixaban for now.    Murvin Natal MD How to contact the [facility] Attending or Consulting provider [place] or covering provider during after hours [place], for this patient?  1. Check the care team in [facility] and look for a) attending/consulting [facility] provider listed and b) the [facility] team listed 2. Log into [email or web address] and use [place]'s universal password to access. If you do not have the password, please contact the hospital operator. 3. Locate the [facility] provider you are looking for under [facility] and page to a number that you can be directly reached. 4. If you still have difficulty reaching the provider, please page the [facility] (Director on Call) for the Hospitalists listed on amion for assistance.

## 2020-10-15 NOTE — Progress Notes (Signed)
PROGRESS NOTE  [name]  VXB:[id number] DOB: 19-Jul-1951 DOA: 10/12/2020 PCP: Patient, No Pcp Per   Chief Complaint  Patient presents with  . Near Syncope   Brief Admission History:  69 y.o. male, with past medical history of diabetes mellitus, CKD stage V, recent hospitalization at [facility] for which he started hemodialysis for ESRD on MWF schedule, and diagnosed with A. fib when he started on amiodarone, metoprolol and Eliquis, patient was sent from hemodialysis center for near syncope and orthostasis, patient reports he was discharged last week from [place], he is on hemodialysis center since Monday, he does report syncope on Monday after dialysis, as well he does report orthostasis and near syncope for last couple days which he was started on midodrine by his nephrologist, report this morning prior to dialysis he felt dizzy and lightheaded where he was hypotensive for which they sent him to ED, patient report he is still making good amount of urine daily, reports his weight 2 weeks ago was 29.2 kg, and during most dialysis session was at 76, as well he does report diarrhea x2 weeks, he denies fever, chills or abdominal pain, he does report episodes of vomiting on Wednesday, but no recurrence, overall he reports good oral intake and fluid consumption.  Patient was noted to be orthostatic127/67 supine, 71/48 standing with no improvement despite receiving 500 cc x 2 bolus, Significant for potassium of 3.2, sodium of 131, nine 8.2, CT abdomen pelvis significant for wall thickening in the rectal area, with redundant dilated sigmoid colon, but no overt volvulus, so [facility] hospitalist consulted to admit.  Assessment & Plan:   Active Problems:   Dehydration   ESRD (end stage renal disease) (HCC)   Unspecified atrial fibrillation (HCC)   Syncope   Orthostatic hypotension   Chronic diarrhea   Colitis presumed infectious   Pressure injury of skin   Diarrhea  1. Orthostatic hypotension -  from intravascular volume depletion, he has ongoing diarrhea from unspecified colitis and needs IV fluids to keep up with his maintenance needs.  IV bolus 500cc given and maintenance IV fluids ordered. He responded well to hydration.    2. ESRD - recently started HD.  Tolerated HD treatment with no problems.  HD 11/15.  Per nephrology can dc from nephrology standpoint.  3. Chronic diarrhea - secondary to unspecified colitis. He is volume depleted.  GI consultation requested.  4. Colitis presumed infectious - C diff and stool studies and Gi started on antibiotics.  Further recommendations to follow.  Appreciate GI follow up.  Waiting for stool studies to be collected.  Diarrhea much better after starting antibiotics.  5. Paroxysmal atrial fibrillation - apixaban and amiodarone, temporarily holding metoprol in setting of orthostatic hypotension.  6. Hyperlipidemia - resumed home statin therapy.  7. Recurrent syncope - secondary to orthostatic hypotension, repeat Echocardiogram with no large effusion seen.   Treating dehydration and addressing chronic diarrhea.  8. Anemia of CKD - Hg down to 6.9, transfusing 1 unit PRBC recheck in CBC in AM.  9. Thrombocytosis - following.  10. Hypokalemia/hyponatremia - Addressed with HD treatment. 11. Hypocalcemia - corrected for low albumin.     DVT prophylaxis: apixaban Code Status: full  Family Communication: t/c to Maudie Mercury, no answer unable to leave message 11/13, 11/14 Disposition: Home   Status is: Inpatient  Remains inpatient appropriate because:IV treatments appropriate due to intensity of illness or inability to take PO and Inpatient level of care appropriate due to severity of illness  Dispo: The patient is from: Home              Anticipated d/c is to: Home              Anticipated d/c date is: 2 days              Patient currently is not medically stable to d/c. Consultants:   Nephrology  Gastroenterology  Procedures:   ESRD  2D Echo  11/13  IMPRESSIONS  1. Left ventricular ejection fraction, by estimation, is 60 to 65%. The left ventricle has normal function. The left ventricle has no regional wall motion abnormalities. Left ventricular diastolic parameters are consistent with Grade I diastolic dysfunction (impaired relaxation). Elevated left atrial pressure.  2. Right ventricular systolic function is normal. The right ventricular size is normal. There is normal pulmonary artery systolic pressure.  3. The mitral valve is normal in structure. No evidence of mitral valve regurgitation. No evidence of mitral stenosis.  4. The aortic valve is normal in structure. Aortic valve regurgitation is not visualized. No aortic stenosis is present.  5. There is Moderate (Grade III) protruding plaque involving the transverse aorta.  6. The inferior vena cava is normal in size with greater than 50% respiratory variability, suggesting right atrial pressure of 3 mmHg.    Antimicrobials:  Ciprofloxacin 11/13 >> Metronidazole 11/13>>   Subjective: Pt reports feeling weak but otherwise diarrhea much better.   Objective: Vitals:   10/14/20 1455 10/14/20 1738 10/14/20 2046 10/15/20 0539  BP: 131/73 135/76 130/68 133/71  Pulse: 77 72 68 63  Resp: 17 18 20 20   Temp: 98.2 F (36.8 C) 98.4 F (36.9 C) 99.5 F (37.5 C) 98.7 F (37.1 C)  TempSrc: Oral Oral Oral Oral  SpO2: 95% 95% 95% 98%  Weight:      Height:        Intake/Output Summary (Last 24 hours) at 10/15/2020 1138 Last data filed at 10/15/2020 0634 Gross per 24 hour  Intake 1437.17 ml  Output 1200 ml  Net 237.17 ml   Filed Weights   10/13/20 1252 10/13/20 2120 10/14/20 0204  Weight: 74.5 kg 74.1 kg 74.1 kg   Examination:  General exam: Appears calm and comfortable  Respiratory system: Clear to auscultation. Respiratory effort normal. Cardiovascular system: normal S1 & S2 heard. No JVD, murmurs, rubs, gallops or clicks. No pedal edema. Gastrointestinal system:  Abdomen is nondistended, soft and nontender. No organomegaly or masses felt. Normal bowel sounds heard. Central nervous system: Alert and oriented. No focal neurological deficits. Extremities: Symmetric 5 x 5 power. Skin: No rashes, lesions or ulcers Psychiatry: Judgement and insight appear normal. Flat affect.   Data Reviewed: I have personally reviewed following labs and imaging studies  CBC: Recent Labs  Lab 10/12/20 1344 10/13/20 0434 10/14/20 0550 10/14/20 2333 10/15/20 0627  WBC 8.3 6.0 5.1  --  6.9  HGB 8.2* 7.8* 6.9* 8.2* 8.3*  HCT 27.3* 26.4* 22.8* 26.0* 27.0*  MCV 102.6* 100.4* 100.0  --  95.4  PLT 577* 573* 511*  --  449*    Basic Metabolic Panel: Recent Labs  Lab 10/12/20 1344 10/13/20 0434 10/13/20 1452 10/14/20 0550 10/15/20 0627  NA 131* 131*  --  132* 130*  K 3.4* 3.3*  --  3.6 3.5  CL 97* 99  --  99 102  CO2 23 22  --  23 20*  GLUCOSE 152* 95  --  88 99  BUN 41* 45*  --  25* 43*  CREATININE 5.49* 5.33*  --  3.18* 4.51*  CALCIUM 7.2* 7.2*  --  7.0* 6.9*  PHOS  --   --  4.3 3.0 3.3    GFR: Estimated Creatinine Clearance: 16 mL/min (A) (by C-G formula based on SCr of 4.51 mg/dL (H)).  Liver Function Tests: Recent Labs  Lab 10/12/20 1348 10/14/20 0550 10/15/20 0627  AST 14*  --   --   ALT 15  --   --   ALKPHOS 74  --   --   BILITOT 0.6  --   --   PROT 5.1*  --   --   ALBUMIN 1.9* 2.0*  2.0* 1.8*    CBG: Recent Labs  Lab 10/12/20 1344 10/13/20 0635 10/14/20 0535 10/14/20 0737 10/15/20 0536  GLUCAP 139* 88 88 88 98    Recent Results (from the past 240 hour(s))  Respiratory Panel by RT PCR (Flu A&B, Covid) - Nasopharyngeal Swab     Status: None   Collection Time: 10/12/20  3:13 PM   Specimen: Nasopharyngeal Swab  Result Value Ref Range Status   SARS Coronavirus 2 by RT PCR NEGATIVE NEGATIVE Final    Comment: (NOTE) SARS-CoV-2 target nucleic acids are NOT DETECTED.  The SARS-CoV-2 RNA is generally detectable in upper  respiratoy specimens during the acute phase of infection. The lowest concentration of SARS-CoV-2 viral copies this assay can detect is 131 copies/mL. A negative result does not preclude SARS-Cov-2 infection and should not be used as the sole basis for treatment or other patient management decisions. A negative result may occur with  improper specimen collection/handling, submission of specimen other than nasopharyngeal swab, presence of viral mutation(s) within the areas targeted by this assay, and inadequate number of viral copies (<131 copies/mL). A negative result must be combined with clinical observations, patient history, and epidemiological information. The expected result is Negative.  Fact Sheet for Patients:  PinkCheek.be  Fact Sheet for Healthcare Providers:  GravelBags.it  This test is no t yet approved or cleared by the [place] FDA and  has been authorized for detection and/or diagnosis of SARS-CoV-2 by FDA under an Emergency Use Authorization (EUA). This EUA will remain  in effect (meaning this test can be used) for the duration of the COVID-19 declaration under Section 564(b)(1) of the Act, 21 U.S.C. section 360bbb-3(b)(1), unless the authorization is terminated or revoked sooner.     Influenza A by PCR NEGATIVE NEGATIVE Final   Influenza B by PCR NEGATIVE NEGATIVE Final    Comment: (NOTE) The Xpert Xpress SARS-CoV-2/FLU/RSV assay is intended as an aid in  the diagnosis of influenza from Nasopharyngeal swab specimens and  should not be used as a sole basis for treatment. Nasal washings and  aspirates are unacceptable for Xpert Xpress SARS-CoV-2/FLU/RSV  testing.  Fact Sheet for Patients: PinkCheek.be  Fact Sheet for Healthcare Providers: GravelBags.it  This test is not yet approved or cleared by the [place] FDA and  has been  authorized for detection and/or diagnosis of SARS-CoV-2 by  FDA under an Emergency Use Authorization (EUA). This EUA will remain  in effect (meaning this test can be used) for the duration of the  Covid-19 declaration under Section 564(b)(1) of the Act, 21  U.S.C. section 360bbb-3(b)(1), unless the authorization is  terminated or revoked. Performed at [facility], [address], [place], [place] [address]   MRSA PCR Screening     Status: None   Collection Time: 10/13/20  2:35 PM   Specimen: Nasopharyngeal  Result Value Ref Range Status   MRSA by PCR NEGATIVE NEGATIVE Final    Comment:        The GeneXpert MRSA Assay (FDA approved for NASAL specimens only), is one component of a comprehensive MRSA colonization surveillance program. It is not intended to diagnose MRSA infection nor to guide or monitor treatment for MRSA infections. Performed at [facility], [address] [place], [place] [address]   C Difficile Quick Screen w PCR reflex     Status: None   Collection Time: 10/14/20  2:15 PM   Specimen: Stool  Result Value Ref Range Status   C Diff antigen NEGATIVE NEGATIVE Final   C Diff toxin NEGATIVE NEGATIVE Final   C Diff interpretation No C. difficile detected.  Final    Comment: Performed at [facility], [address], [place], [place] [address]     Radiology Studies: No results found. Scheduled Meds: . amiodarone  200 mg Oral Daily  . apixaban  2.5 mg Oral BID  . calcitRIOL  0.5 mcg Oral Daily  . Chlorhexidine Gluconate Cloth  6 each Topical Q0600  . ciprofloxacin  500 mg Oral Daily  . feeding supplement (NEPRO CARB STEADY)  237 mL Oral BID BM  . metroNIDAZOLE  500 mg Oral Q8H  . midodrine  5 mg Oral TID  . simvastatin  20 mg Oral QPM  . sodium chloride flush  3 mL Intravenous Q12H   Continuous Infusions: . sodium chloride    . sodium chloride    . sodium chloride 50 mL/hr at 10/14/20 2204    LOS: 2 days   Time spent: 31 mins  Vashon Riordan Wynetta Emery,  MD How to contact the [facility] Attending or Consulting provider [place] or covering provider during after hours [place], for this patient?  1. Check the care team in [facility] and look for a) attending/consulting TRH provider listed and b) the [facility] team listed 2. Log into [email or web address] and use [place]'s universal password to access. If you do not have the password, please contact the hospital operator. 3. Locate the [facility] provider you are looking for under [facility] and page to a number that you can be directly reached. 4. If you still have difficulty reaching the provider, please page the [facility] (Director on Call) for the Hospitalists listed on amion for assistance.  10/15/2020, 11:38 AM

## 2020-10-15 NOTE — Progress Notes (Signed)
Subjective: 2 watery stools thus far today. Per nursing staff, noted moderate amount of blood mixed in stool. No abdominal pain. No N/V. Poor appetite.   Objective: Vital signs in last 24 hours: Temp:  [98 F (36.7 C)-99.5 F (37.5 C)] 98.7 F (37.1 C) (11/15 0539) Pulse Rate:  [63-88] 63 (11/15 0539) Resp:  [17-20] 20 (11/15 0539) BP: (130-140)/(67-76) 133/71 (11/15 0539) SpO2:  [95 %-98 %] 98 % (11/15 0539) Last BM Date: 10/14/20 General:   Alert and oriented, pleasant, sallow complexion Head:  Normocephalic and atraumatic. Eyes:  No icterus, sclera clear. Conjuctiva pink.  Abdomen:  Bowel sounds present, soft, non-tender, non-distended. No HSM or hernias noted. No rebound or guarding. No masses appreciated  Extremities:  Without  edema. Neurologic:  Alert and  oriented x4 Psych:  Alert and cooperative. Normal mood and affect.  Intake/Output from previous day: 11/14 0701 - 11/15 0700 In: 1437.2 [P.O.:240; I.V.:487.2; Blood:710] Out: 1400 [Urine:1400] Intake/Output this shift: No intake/output data recorded.  Lab Results: Recent Labs    10/13/20 0434 10/13/20 0434 10/14/20 0550 10/14/20 2333 10/15/20 0627  WBC 6.0  --  5.1  --  6.9  HGB 7.8*   < > 6.9* 8.2* 8.3*  HCT 26.4*   < > 22.8* 26.0* 27.0*  PLT 573*  --  511*  --  449*   < > = values in this interval not displayed.   BMET Recent Labs    10/13/20 0434 10/14/20 0550 10/15/20 0627  NA 131* 132* 130*  K 3.3* 3.6 3.5  CL 99 99 102  CO2 22 23 20*  GLUCOSE 95 88 99  BUN 45* 25* 43*  CREATININE 5.33* 3.18* 4.51*  CALCIUM 7.2* 7.0* 6.9*   LFT Recent Labs    10/12/20 1348 10/14/20 0550 10/15/20 0627  PROT 5.1*  --   --   ALBUMIN 1.9* 2.0*  2.0* 1.8*  AST 14*  --   --   ALT 15  --   --   ALKPHOS 74  --   --   BILITOT 0.6  --   --   BILIDIR 0.2  --   --   IBILI 0.4  --   --    Hepatitis Panel Recent Labs    10/13/20 2120  HEPBSAG NON REACTIVE    Assessment: 69 year old male with ESRD  on dialysis, afib on Eliquis, admitted with 2 week history of diarrhea and CT scan (without contrast) findings of colitis with differentials including infectious vs inflammatory. Thus far Cdiff is negative, and GI pathogen panel collected this morning is pending. Now with rectal bleeding this morning per nursing staff and persistent loose stool that is unchanged in frequency per patient. Empiric antibiotics on board (Cipro and Flagyl).   Anemia: with Hgb down to 6.9 yesterday from admitting 8.2, receiving 2 units PRBCs yesterday. Hgb 8.3 today. Iron studies with low iron. Anemia multifactorial in setting of CKD and IDA component.  Discussed with Dr. Wynetta Emery: Eliquis will be on hold for now in light of rectal bleeding. Will await GI pathogen panel. Initially plans for colonoscopy as outpatient, but may need during admission. Will need to review GI pathogen panel when available.     Plan: Await GI pathogen panel, collected this morning Monitor for further rectal bleeding Eliquis on hold: received last dose this morning Continue empiric antibiotics Follow H/H, transfuse as needed Further recommendations to follow   Annitta Needs, PhD, ANP-BC [facility]      LOS:  2 days    10/15/2020, 10:10 AM

## 2020-10-15 NOTE — NC FL2 (Signed)
[place] LEVEL OF CARE SCREENING TOOL     IDENTIFICATION  Patient Name: [name] Birthdate: Jul 30, 1951 Sex: male Admission Date (Current Location): 10/12/2020  [facility] and [place] Number:  [employer] and Address:  [place] [address] [place]      Provider Number: [id number]  Attending Physician Name and Address:  Murlean Iba, MD  Relative Name and Phone Number:  [name] [phone] [facility] Phone)    Current Level of Care: Hospital Recommended Level of Care: [place] Prior Approval Number:    Date Approved/Denied:   PASRR Number: [id number]  Discharge Plan: SNF    Current Diagnoses: Patient Active Problem List   Diagnosis Date Noted  . Diarrhea   . Orthostatic hypotension 10/13/2020  . Chronic diarrhea 10/13/2020  . Colitis presumed infectious 10/13/2020  . Pressure injury of skin 10/13/2020  . ESRD (end stage renal disease) ([place]) 10/12/2020  . Unspecified atrial fibrillation ([employer]) 10/12/2020  . Syncope 10/12/2020  . Small bowel obstruction ([place])   . Type 2 diabetes mellitus ([place]) 06/11/2018  . Peripheral neuropathy 06/11/2018  . Ankle syndesmosis disruption, right, initial encounter 06/11/2018  . AKI (acute kidney injury) ([place])   . Dehydration   . Open right ankle fracture 06/08/2018  . Fracture, ankle 06/08/2018  . Open displaced fracture of medial malleolus of tibia, type III 06/08/2018    Orientation RESPIRATION BLADDER Height & Weight     Self, Time, Situation, Place  Normal Continent Weight: 163 lb 5.8 oz (74.1 kg) Height:  6\' 2"  (188 cm)  BEHAVIORAL SYMPTOMS/MOOD NEUROLOGICAL BOWEL NUTRITION STATUS      Continent Diet (renal. Fluid restriction 1259mL fluid)  AMBULATORY STATUS COMMUNICATION OF NEEDS Skin   Limited Assist Verbally PU Stage and Appropriate Care (Stage 2, sacrum, circumferential)                       Personal Care Assistance  Level of Assistance  Bathing, Dressing, Feeding Bathing Assistance: Limited assistance Feeding assistance: Independent Dressing Assistance: Limited assistance     Functional Limitations Info  Sight, Hearing, Speech Sight Info: Adequate Hearing Info: Adequate Speech Info: Adequate    SPECIAL CARE FACTORS FREQUENCY  PT (By licensed PT)     PT Frequency: 5x/week              Contractures Contractures Info: Not present    Additional Factors Info  Code Status, Allergies Code Status Info: Full Code Allergies Info: NKA           Current Medications (10/15/2020):  This is the current hospital active medication list Current Facility-Administered Medications  Medication Dose Route Frequency Provider Last Rate Last Admin  . 0.9 %  sodium chloride infusion  100 mL Intravenous PRN Justin Mend, MD      . 0.9 %  sodium chloride infusion  100 mL Intravenous PRN Justin Mend, MD      . 0.9 %  sodium chloride infusion   Intravenous Continuous Wynetta Emery, Clanford L, MD 50 mL/hr at 10/14/20 2204 New Bag at 10/14/20 2204  . alteplase (CATHFLO ACTIVASE) injection 2 mg  2 mg Intracatheter Once PRN Justin Mend, MD      . alum & mag hydroxide-simeth (MAALOX/MYLANTA) 200-200-20 MG/5ML suspension 30 mL  30 mL Oral Q4H PRN Wynetta Emery, Clanford L, MD   30 mL at 10/14/20 0657  . amiodarone (PACERONE) tablet 200 mg  200 mg Oral Daily  Elgergawy, Silver Huguenin, MD   200 mg at 10/15/20 0825  . calcitRIOL (ROCALTROL) capsule 0.5 mcg  0.5 mcg Oral Daily Donato Heinz, MD   0.5 mcg at 10/15/20 1040  . Chlorhexidine Gluconate Cloth 2 % PADS 6 each  6 each Topical Q0600 Justin Mend, MD   6 each at 10/15/20 [phone]  . ciprofloxacin (CIPRO) tablet 500 mg  500 mg Oral Daily Eloise Harman, DO   500 mg at 10/15/20 8250  . diphenoxylate-atropine (LOMOTIL) 2.5-0.025 MG per tablet 1 tablet  1 tablet Oral QID PRN Elgergawy, Silver Huguenin, MD      . feeding supplement (NEPRO CARB STEADY) liquid 237 mL   237 mL Oral BID BM Justin Mend, MD   237 mL at 10/15/20 0832  . heparin injection 3,800 Units  3,800 Units Dialysis PRN Justin Mend, MD   3,800 Units at 10/14/20 0045  . metroNIDAZOLE (FLAGYL) tablet 500 mg  500 mg Oral Q8H Carver, Charles K, DO   500 mg at 10/15/20 0370  . midodrine (PROAMATINE) tablet 5 mg  5 mg Oral TID Elgergawy, Silver Huguenin, MD   5 mg at 10/15/20 0824  . ondansetron (ZOFRAN) injection 4 mg  4 mg Intravenous Q8H PRN Wynetta Emery, Clanford L, MD   4 mg at 10/13/20 1803  . prochlorperazine (COMPAZINE) injection 10 mg  10 mg Intravenous Q6H PRN Wynetta Emery, Clanford L, MD   10 mg at 10/13/20 2046  . simvastatin (ZOCOR) tablet 20 mg  20 mg Oral QPM Elgergawy, Silver Huguenin, MD   20 mg at 10/14/20 1638  . sodium chloride flush (NS) 0.9 % injection 3 mL  3 mL Intravenous Q12H Elgergawy, Silver Huguenin, MD   3 mL at 10/15/20 4888     Discharge Medications: Please see discharge summary for a list of discharge medications.  Relevant Imaging Results:  Relevant Lab Results:   Additional Information SSN [id number], Dialysis MWF. Patient reports he has had both [place] Covid 19 vaccinations in April and May.  Travelle Mcclimans, Clydene Pugh, LCSW

## 2020-10-15 NOTE — TOC Initial Note (Signed)
Transition of Care [facility]) - Initial/Assessment Note    Patient Details  Name: [name] MRN: [id number] Date of Birth: [birth year]  Transition of Care [facility]) CM/SW Contact:    Ihor Gully, LCSW Phone Number: 10/15/2020, 3:33 PM  Clinical Narrative:                 Patient from home alone. Independent at baseline. Drives. A.fib. on HD MWF. Has cane and walker. PT recommended SNF. Agreeable to SNF. Reports Pfizer Covid vaccination shots in April and May.   Expected Discharge Plan: Skilled Nursing Facility Barriers to Discharge: Continued Medical Work up   Patient Goals and CMS Choice        Expected Discharge Plan and Services Expected Discharge Plan: [place]       Living arrangements for the past 2 months: Single Family Home                                      Prior Living Arrangements/Services Living arrangements for the past 2 months: Single Family Home Lives with:: Self Patient language and need for interpreter reviewed:: Yes Do you feel safe going back to the place where you live?: Yes      Need for Family Participation in Patient Care: Yes (Comment) Care giver support system in place?: Yes (comment)   Criminal Activity/Legal Involvement Pertinent to Current Situation/Hospitalization: No - Comment as needed  Activities of Daily Living Home Assistive Devices/Equipment: Walker (specify type) ADL Screening (condition at time of admission) Patient's cognitive ability adequate to safely complete daily activities?: Yes Is the patient deaf or have difficulty hearing?: No Does the patient have difficulty seeing, even when wearing glasses/contacts?: No Does the patient have difficulty concentrating, remembering, or making decisions?: No Patient able to express need for assistance with ADLs?: Yes Does the patient have difficulty dressing or bathing?: No Independently performs ADLs?: Yes (appropriate for developmental age) Does the patient  have difficulty walking or climbing stairs?: No Weakness of Legs: None Weakness of Arms/Hands: None  Permission Sought/Granted                  Emotional Assessment     Affect (typically observed): Appropriate Orientation: : Oriented to Self, Oriented to Place, Oriented to  Time, Oriented to Situation Alcohol / Substance Use: Not Applicable Psych Involvement: No (comment)  Admission diagnosis:  Orthostatic hypotension [I95.1] Dehydration [E86.0] Syncope [R55] ESRD on dialysis ([place]) [N18.6, Z99.2] Diarrhea, unspecified type [R19.7] Patient Active Problem List   Diagnosis Date Noted  . Diarrhea   . Orthostatic hypotension 10/13/2020  . Chronic diarrhea 10/13/2020  . Colitis presumed infectious 10/13/2020  . Pressure injury of skin 10/13/2020  . ESRD (end stage renal disease) ([place]) 10/12/2020  . Unspecified atrial fibrillation ([place]) 10/12/2020  . Syncope 10/12/2020  . Small bowel obstruction ([place])   . Type 2 diabetes mellitus ([place]) 06/11/2018  . Peripheral neuropathy 06/11/2018  . Ankle syndesmosis disruption, right, initial encounter 06/11/2018  . AKI (acute kidney injury) ([place])   . Dehydration   . Open right ankle fracture 06/08/2018  . Fracture, ankle 06/08/2018  . Open displaced fracture of medial malleolus of tibia, type III 06/08/2018   PCP:  Patient, No Pcp Per Pharmacy:   [id number] - [place], [place] [address] Phone: [phone] Fax: [phone]  Social Determinants of Health (SDOH) Interventions    Readmission Risk Interventions No flowsheet data found.

## 2020-10-15 NOTE — Plan of Care (Signed)
  Problem: Acute Rehab PT Goals(only PT should resolve) Goal: Pt Will Go Supine/Side To Sit Outcome: Progressing Flowsheets (Taken 10/15/2020 1355) Pt will go Supine/Side to Sit:  with supervision  with modified independence Goal: Patient Will Transfer Sit To/From Stand Outcome: Progressing Flowsheets (Taken 10/15/2020 1355) Patient will transfer sit to/from stand: with min guard assist Goal: Pt Will Transfer Bed To Chair/Chair To Bed Outcome: Progressing Flowsheets (Taken 10/15/2020 1355) Pt will Transfer Bed to Chair/Chair to Bed:  min guard assist  with min assist Goal: Pt Will Ambulate Outcome: Progressing Flowsheets (Taken 10/15/2020 1355) Pt will Ambulate:  50 feet  with minimal assist  with rolling walker   1:58 PM, 10/15/20 Lonell Grandchild, MPT Physical Therapist with [facility] [phone] office [phone] mobile phone

## 2020-10-15 NOTE — Progress Notes (Signed)
Patient ID: [name], male   DOB: [birth year], 69 y.o.   MRN: [id number]  [facility] Progress Note    Subjective:    Feeling better this morning.   Objective:   BP 133/71 (BP Location: Right Arm)   Pulse 63   Temp 98.7 F (37.1 C) (Oral)   Resp 20   Ht 6\' 2"  (1.88 m)   Wt 74.1 kg   SpO2 98%   BMI 20.97 kg/m   Intake/Output: I/O last 3 completed shifts: In: 2087.2 [P.O.:240; I.V.:1137.2; Blood:710] Out: 6789 [Urine:1400]   Intake/Output this shift:  No intake/output data recorded. Weight change:   Physical Exam: Gen: NAD CVS: RRR, no rub Resp: cta Abd: +Bs, soft, NT/ND Ext: no edema  Labs: BMET Recent Labs  Lab 10/12/20 1344 10/12/20 1348 10/13/20 0434 10/13/20 1452 10/14/20 0550 10/15/20 0627  NA 131*  --  131*  --  132* 130*  K 3.4*  --  3.3*  --  3.6 3.5  CL 97*  --  99  --  99 102  CO2 23  --  22  --  23 20*  GLUCOSE 152*  --  95  --  88 99  BUN 41*  --  45*  --  25* 43*  CREATININE 5.49*  --  5.33*  --  3.18* 4.51*  ALBUMIN  --  1.9*  --   --  2.0*  2.0* 1.8*  CALCIUM 7.2*  --  7.2*  --  7.0* 6.9*  PHOS  --   --   --  4.3 3.0 3.3   CBC Recent Labs  Lab 10/12/20 1344 10/12/20 1344 10/13/20 0434 10/14/20 0550 10/14/20 2333 10/15/20 0627  WBC 8.3  --  6.0 5.1  --  6.9  HGB 8.2*   < > 7.8* 6.9* 8.2* 8.3*  HCT 27.3*   < > 26.4* 22.8* 26.0* 27.0*  MCV 102.6*  --  100.4* 100.0  --  95.4  PLT 577*  --  573* 511*  --  449*   < > = values in this interval not displayed.      Medications:    . amiodarone  200 mg Oral Daily  . apixaban  2.5 mg Oral BID  . Chlorhexidine Gluconate Cloth  6 each Topical Q0600  . ciprofloxacin  500 mg Oral Daily  . feeding supplement (NEPRO CARB STEADY)  237 mL Oral BID BM  . metroNIDAZOLE  500 mg Oral Q8H  . midodrine  5 mg Oral TID  . simvastatin  20 mg Oral QPM  . sodium chloride flush  3 mL Intravenous Q12H     Assessment/ Plan:   1. Syncope/hypotension- presumably volume depletion  due to diarrhea and UF with HD.   2. Colitis- GI following and on cipro/flagyl.  Possible colonoscopy to determine if this is infectious or related to inflammatory process such as ulcerative colitis or Crohn's disease.  C diff negative and GI panel pending 3. ESRD new start on MWF at [facility].  For HD today 4. Anemia: of CKD- s/p blood transfusion.  Follow h/H, no heparin with HD. 5. 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  Low phos and calcium.  Check iPTH and resume calcitriol 0.35mcg daily (was on as an outpatient) 6. Nutrition: per priamry 7. Hypertension: stable.  Improved with blood. 8. Vascular access- will need to f/u with Dr. Donnetta Hutching once stable for discharge. 9. A fib- on amiodarone and eliquis   Donetta Potts, MD [facility]  [place] Pager [phone] 10/15/2020, 8:52 AM

## 2020-10-16 DIAGNOSIS — D649 Anemia, unspecified: Secondary | ICD-10-CM | POA: Diagnosis not present

## 2020-10-16 DIAGNOSIS — K625 Hemorrhage of anus and rectum: Secondary | ICD-10-CM | POA: Diagnosis not present

## 2020-10-16 DIAGNOSIS — E860 Dehydration: Secondary | ICD-10-CM | POA: Diagnosis not present

## 2020-10-16 DIAGNOSIS — N186 End stage renal disease: Secondary | ICD-10-CM | POA: Diagnosis not present

## 2020-10-16 DIAGNOSIS — R197 Diarrhea, unspecified: Secondary | ICD-10-CM | POA: Diagnosis not present

## 2020-10-16 DIAGNOSIS — K529 Noninfective gastroenteritis and colitis, unspecified: Secondary | ICD-10-CM | POA: Diagnosis not present

## 2020-10-16 LAB — GASTROINTESTINAL PANEL BY PCR, STOOL (REPLACES STOOL CULTURE)

## 2020-10-16 LAB — CBC
HCT: 22.4 % — ABNORMAL LOW (ref 39.0–52.0)
Hemoglobin: 6.7 g/dL — CL (ref 13.0–17.0)
MCH: 27.9 pg (ref 26.0–34.0)
MCHC: 29.9 g/dL — ABNORMAL LOW (ref 30.0–36.0)
MCV: 93.3 fL (ref 80.0–100.0)
Platelets: 417 10*3/uL — ABNORMAL HIGH (ref 150–400)
RBC: 2.4 MIL/uL — ABNORMAL LOW (ref 4.22–5.81)
RDW: 18.8 % — ABNORMAL HIGH (ref 11.5–15.5)
WBC: 6.7 10*3/uL (ref 4.0–10.5)
nRBC: 0 % (ref 0.0–0.2)

## 2020-10-16 LAB — RENAL FUNCTION PANEL
Albumin: 1.7 g/dL — ABNORMAL LOW (ref 3.5–5.0)
Anion gap: 9 (ref 5–15)
BUN: 34 mg/dL — ABNORMAL HIGH (ref 8–23)
CO2: 22 mmol/L (ref 22–32)
Calcium: 6.9 mg/dL — ABNORMAL LOW (ref 8.9–10.3)
Chloride: 99 mmol/L (ref 98–111)
Creatinine, Ser: 3.58 mg/dL — ABNORMAL HIGH (ref 0.61–1.24)
GFR, Estimated: 18 mL/min — ABNORMAL LOW (ref >60)
Glucose, Bld: 97 mg/dL (ref 70–99)
Phosphorus: 2.5 mg/dL (ref 2.5–4.6)
Potassium: 3.8 mmol/L (ref 3.5–5.1)
Sodium: 130 mmol/L — ABNORMAL LOW (ref 135–145)

## 2020-10-16 LAB — HEMOGLOBIN AND HEMATOCRIT, BLOOD
HCT: 26.8 % — ABNORMAL LOW (ref 39.0–52.0)
Hemoglobin: 8.3 g/dL — ABNORMAL LOW (ref 13.0–17.0)

## 2020-10-16 LAB — PREPARE RBC (CROSSMATCH)

## 2020-10-16 MED ORDER — SODIUM CHLORIDE 0.9% IV SOLUTION: Freq: Once | INTRAVENOUS | Status: DC

## 2020-10-16 MED ORDER — PEG 3350-KCL-NA BICARB-NACL 420 G PO SOLR
4000.0000 mL | Freq: Once | ORAL | Status: AC
  Administered 2020-10-17: 4000 mL via ORAL
  Filled 2020-10-16: qty 4000

## 2020-10-16 NOTE — Progress Notes (Signed)
Patient ID: TOMER CHALMERS, male   DOB: 04/09/1951, 69 y.o.   MRN: [id number] S: Had another episode of symptomatic orthostasis with BP dropping to 68/40 despite IVF's and no UF with HD.  He also had some rectal bleeding yesterday. O:BP 127/64 (BP Location: Right Arm)   Pulse 68   Temp 99 F (37.2 C) (Oral)   Resp 18   Ht 6\' 2"  (1.88 m)   Wt 74.1 kg   SpO2 95%   BMI 20.97 kg/m   Intake/Output Summary (Last 24 hours) at 10/16/2020 0851 Last data filed at 10/16/2020 0533 Gross per 24 hour  Intake 480 ml  Output 1000 ml  Net -520 ml   Intake/Output: I/O last 3 completed shifts: In: 720 [P.O.:720] Out: 1800 [Urine:1800]  Intake/Output this shift:  No intake/output data recorded. Weight change:  Gen: NAD CVS: RRR, no rub Resp: cta Abd: +BS, soft, Nt/ND Ext: trace edema of right ankle 1+ on left.  Recent Labs  Lab 10/12/20 1344 10/12/20 1348 10/13/20 0434 10/13/20 1452 10/14/20 0550 10/15/20 0627  NA 131*  --  131*  --  132* 130*  K 3.4*  --  3.3*  --  3.6 3.5  CL 97*  --  99  --  99 102  CO2 23  --  22  --  23 20*  GLUCOSE 152*  --  95  --  88 99  BUN 41*  --  45*  --  25* 43*  CREATININE 5.49*  --  5.33*  --  3.18* 4.51*  ALBUMIN  --  1.9*  --   --  2.0*  2.0* 1.8*  CALCIUM 7.2*  --  7.2*  --  7.0* 6.9*  PHOS  --   --   --  4.3 3.0 3.3  AST  --  14*  --   --   --   --   ALT  --  15  --   --   --   --    Liver Function Tests: Recent Labs  Lab 10/12/20 1348 10/14/20 0550 10/15/20 0627  AST 14*  --   --   ALT 15  --   --   ALKPHOS 74  --   --   BILITOT 0.6  --   --   PROT 5.1*  --   --   ALBUMIN 1.9* 2.0*  2.0* 1.8*   Recent Labs  Lab 10/12/20 1348  LIPASE 20   No results for input(s): AMMONIA in the last 168 hours. CBC: Recent Labs  Lab 10/12/20 1344 10/12/20 1344 10/13/20 0434 10/13/20 0434 10/14/20 0550 10/14/20 2333 10/15/20 0627  WBC 8.3   < > 6.0  --  5.1  --  6.9  HGB 8.2*   < > 7.8*   < > 6.9* 8.2* 8.3*  HCT 27.3*   < > 26.4*   < >  22.8* 26.0* 27.0*  MCV 102.6*  --  100.4*  --  100.0  --  95.4  PLT 577*   < > 573*  --  511*  --  449*   < > = values in this interval not displayed.   Cardiac Enzymes: No results for input(s): CKTOTAL, CKMB, CKMBINDEX, TROPONINI in the last 168 hours. CBG: Recent Labs  Lab 10/12/20 1344 10/13/20 0635 10/14/20 0535 10/14/20 0737 10/15/20 0536  GLUCAP 139* 88 88 88 98    Iron Studies:  Recent Labs    10/14/20 0550  IRON 11*  TIBC 107*  FERRITIN 463*   Studies/Results: No results found. Marland Kitchen amiodarone  200 mg Oral Daily  . calcitRIOL  0.5 mcg Oral Daily  . Chlorhexidine Gluconate Cloth  6 each Topical Q0600  . ciprofloxacin  500 mg Oral Daily  . feeding supplement (NEPRO CARB STEADY)  237 mL Oral BID BM  . metroNIDAZOLE  500 mg Oral Q8H  . midodrine  5 mg Oral TID  . simvastatin  20 mg Oral QPM  . sodium chloride flush  3 mL Intravenous Q12H    BMET    Component Value Date/Time   NA 130 (L) 10/15/2020 0627   K 3.5 10/15/2020 0627   CL 102 10/15/2020 0627   CO2 20 (L) 10/15/2020 0627   GLUCOSE 99 10/15/2020 0627   BUN 43 (H) 10/15/2020 0627   CREATININE 4.51 (H) 10/15/2020 0627   CALCIUM 6.9 (L) 10/15/2020 0627   GFRNONAA 13 (L) 10/15/2020 0627   GFRAA 19 (L) 06/15/2018 0407   CBC    Component Value Date/Time   WBC 6.9 10/15/2020 0627   RBC 2.83 (L) 10/15/2020 0627   HGB 8.3 (L) 10/15/2020 0627   HCT 27.0 (L) 10/15/2020 0627   PLT 449 (H) 10/15/2020 0627   MCV 95.4 10/15/2020 0627   MCH 29.3 10/15/2020 0627   MCHC 30.7 10/15/2020 0627   RDW 19.6 (H) 10/15/2020 0627   LYMPHSABS 0.6 (L) 10/05/2020 1850   MONOABS 1.2 (H) 10/05/2020 1850   EOSABS 0.0 10/05/2020 1850   BASOSABS 0.0 10/05/2020 1850     Assessment/Plan:  1. Syncope/hypotension- presumably volume depletion due to diarrhea and UF with HD, but despite IVF's and no UF with HD, he had another episode of symptomatic orthostasis. 1. ECHO with preserved LV function and grade I diastolic  dysfunction.  No LVH. 2. Continue with IVF's but need to be cautious as he now has some ankle edema 3. Continue to keep even with HD for now.   4. Await recheck of hgb as he may require blood transfusion if Hgb has fallen. 2. Colitis- GI following and on cipro/flagyl.  Possible colonoscopy to determine if this is infectious or related to inflammatory process such as ulcerative colitis or Crohn's disease.  C diff negative and GI panel pending 1. Had rectal bleeding yesterday and may require inpatient colonoscopy.  GI following.  3. ESRD new start on MWF at [facility].  For HD tomorrow and will keep even again.  No heparin.  4. Anemia: of CKD- s/p blood transfusion.  Follow h/H, no heparin with HD.  Possible colonoscopy this admission.  GI following.  5. 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  Low phos and calcium.  Resume calcitriol 0.24mcg daily (was on as an outpatient).  Awaiting labs for today. 6. Nutrition: per priamry 7. Hypertension: stable.  Improved with blood. 8. Vascular access- will need to f/u with Dr. Donnetta Hutching once stable for discharge. 9. A fib- on amiodarone and eliquis   Donetta Potts, MD [facility] [phone]

## 2020-10-16 NOTE — Progress Notes (Signed)
Subjective: States he is feeling fine. No abdominal pain, nausea, or vomiting. Had 2 BMs yesterday that were loose to watery. Had 1 Bm this morning. Patient didn't see stool. Per nursing staff patient passed pure blood at 5 am this morning. No additional BMs. No black stools. Reports he feels very lightheaded when he stands up. Had been receiving Eliquis. Last dose was morning of 11/15.   Objective: Vital signs in last 24 hours: Temp:  [98.2 F (36.8 C)-99 F (37.2 C)] 99 F (37.2 C) (11/16 0531) Pulse Rate:  [68-86] 68 (11/16 0531) Resp:  [16-18] 18 (11/16 0531) BP: (105-131)/(61-74) 127/64 (11/16 0531) SpO2:  [95 %-98 %] 95 % (11/16 0531) Last BM Date: 10/15/20 General:  Alert and oriented, pleasant Head:  Normocephalic and atraumatic. Eyes:  No icterus, sclera clear. Conjuctiva pink.  Abdomen:  Bowel sounds present, soft, non-tender, non-distended. No HSM or hernias noted. No rebound or guarding. No masses appreciated  Extremities:  Without trace LE pitting edema in right ankle. Patient reports this is chronic secondary to prior ankle fracture with pins placed.  Neurologic:  Alert and  oriented x4;  grossly normal neurologically. Psych: Normal mood and affect.  Intake/Output from previous day: 11/15 0701 - 11/16 0700 In: 720 [P.O.:720] Out: 1000 [Urine:1000] Intake/Output this shift: No intake/output data recorded.  Lab Results: Recent Labs    10/14/20 0550 10/14/20 2333 10/15/20 0627  WBC 5.1  --  6.9  HGB 6.9* 8.2* 8.3*  HCT 22.8* 26.0* 27.0*  PLT 511*  --  449*   BMET Recent Labs    10/14/20 0550 10/15/20 0627  NA 132* 130*  K 3.6 3.5  CL 99 102  CO2 23 20*  GLUCOSE 88 99  BUN 25* 43*  CREATININE 3.18* 4.51*  CALCIUM 7.0* 6.9*   LFT Recent Labs    10/14/20 0550 10/15/20 0627  ALBUMIN 2.0*  2.0* 1.8*   PT/INR No results for input(s): LABPROT, INR in the last 72 hours. Hepatitis Panel Recent Labs    10/13/20 2120  HEPBSAG NON REACTIVE    Assessment: 69 year old male with ESRD on dialysis, afib on Eliquis, admitted 10/12/20 with 2 week history of diarrhea and CT scan (without contrast) findings of colitis with differentials including infectious vs inflammatory. Thus far Cdiff is negative, and GI pathogen panel collected yesterday and is still pending. Now with rectal bleeding which started 11/15 with 1 episode of painless brbpr on 11/15 and again with 1 episode of painless large volume bright red blood per rectum at 5 AM this morning. Diarrhea with slight improvement from 3 watery BMs daily to 2 loose stool yesterday and 1 so far today in the setting of rectal bleeding. He is on Empiric antibiotics for suspected infectious colitis (Cipro and Flagyl). Can't rule out IBD in the setting of rectal bleeding but this is less likely.   Acute on chronic anemia in the setting of rectal bleeding: Hemoglobin dropped to 6.9 on 11/14 from admitting 8.2.  He received 2 units PRBCs 11/14 with hemoglobin up to 8.3 yesterday.  Repeat hemoglobin this morning back down to 6.7. Iron studies with low iron. 2 units PRBCs have been ordered by Dr. Wynetta Emery. Notably, patient had continued on Eliquis, but this was stopped yesterday due to rectal bleeding with last dose the morning of 11/15.  Suspect patient may have had a diverticular bleed. Less likely that this is secondary colitis, large colon polyps, or malignancy. With 2 significant drops in hemoglobin requiring blood transfusion,  patient is going to need colonoscopy for further evaluation.  Discussed this with Dr. Jenetta Downer.  We will plan for colon prep today and colonoscopy tomorrow.  Plan: Colon prep today.  TCS with propofol with Dr. Laural Golden tomorrow. The risks, benefits, and alternatives have been discussed with the patient in detail. The patient states understanding and desires to proceed.  Clear liquids today.  NPO at midnight.  Continue to hold Eliquis Continue to monitor for overt GI  bleeding. Continue to monitor H/H. Continue empiric antibiotics. Follow-up on GI pathogen panel.    LOS: 3 days    10/16/2020, 7:39 AM   Aliene Altes, PA-C [facility]

## 2020-10-16 NOTE — Progress Notes (Signed)
10/16/2020 4:18 PM    Pt's emergency contact Maudie Mercury returned my telephone call. I updated her with current clinical picture and diagnostic tests and therapies being given and planned.  She verbalized understanding.    Murvin Natal MD Attending Physician How to contact the [facility] Attending or Consulting provider [place] or covering provider during after hours [place], for this patient?  1. Check the care team in [facility] and look for a) attending/consulting TR[facility] provider listed and b) the [facility] team listed 2. Log into [email or web address] and use [place]'s universal password to access. If you do not have the password, please contact the hospital operator. 3. Locate the [facility] provider you are looking for under [facility] and page to a number that you can be directly reached. 4. If you still have difficulty reaching the provider, please page the [facility] (Director on Call) for the Hospitalists listed on amion for assistance.

## 2020-10-16 NOTE — Progress Notes (Signed)
PROGRESS NOTE  [name]  XHB:[id number] DOB: [birth year] DOA: 10/12/2020 PCP: Patient, No Pcp Per   Chief Complaint  Patient presents with  . Near Syncope   Brief Admission History:  69 y.o. male, with past medical history of diabetes mellitus, CKD stage V, recent hospitalization at [facility] for which he started hemodialysis for ESRD on MWF schedule, and diagnosed with A. fib when he started on amiodarone, metoprolol and Eliquis, patient was sent from hemodialysis center for near syncope and orthostasis, patient reports he was discharged last week from [place], he is on hemodialysis center since Monday, he does report syncope on Monday after dialysis, as well he does report orthostasis and near syncope for last couple days which he was started on midodrine by his nephrologist, report this morning prior to dialysis he felt dizzy and lightheaded where he was hypotensive for which they sent him to ED, patient report he is still making good amount of urine daily, reports his weight 2 weeks ago was 29.2 kg, and during most dialysis session was at 76, as well he does report diarrhea x2 weeks, he denies fever, chills or abdominal pain, he does report episodes of vomiting on Wednesday, but no recurrence, overall he reports good oral intake and fluid consumption.  Patient was noted to be orthostatic127/67 supine, 71/48 standing with no improvement despite receiving 500 cc x 2 bolus, Significant for potassium of 3.2, sodium of 131, nine 8.2, CT abdomen pelvis significant for wall thickening in the rectal area, with redundant dilated sigmoid colon, but no overt volvulus, so [facility] hospitalist consulted to admit.  Assessment & Plan:   Active Problems:   Dehydration   ESRD (end stage renal disease) (HCC)   Unspecified atrial fibrillation (HCC)   Syncope   Orthostatic hypotension   Chronic diarrhea   Colitis presumed infectious   Pressure injury of skin   Diarrhea  1. Orthostatic hypotension -  from intravascular volume depletion and acute blood loss anemia, continue IV fluids, transfuse 2 units PRBC.    2. ESRD - recently started HD.  Tolerated HD treatment with no problems and no fluid removal during treatments.   3. Chronic diarrhea - secondary to unspecified colitis. He is volume depleted.  GI following.  4. Rectal bleeding - GI planning inpatient colonoscopy. 5. Acute blood loss anemia - transfuse 2 units PRBC with 2 units on hold if needed.  6. Colitis presumed infectious - C diff and stool studies and Gi started on antibiotics.  Further recommendations to follow.  Appreciate GI follow up.    Diarrhea much better after starting antibiotics.  7. Paroxysmal atrial fibrillation - apixaban on hold due to acute bleeding, continue amiodarone, temporarily holding metoprol in setting of orthostatic hypotension.  8. Hyperlipidemia - resumed home statin therapy.  9. Recurrent syncope - secondary to orthostatic hypotension, repeat Echocardiogram with no large effusion seen.   Treating dehydration and addressing chronic diarrhea and acute blood loss anemia.  10. Thrombocytosis - following.  11. Hypokalemia/hyponatremia - Addressed with HD treatment. 12. Hypocalcemia - corrected for low albumin.     DVT prophylaxis: SCD Code Status: full  Family Communication: t/c to Maudie Mercury, no answer unable to leave message 11/13, 11/14, 11/16  Disposition: Home   Status is: Inpatient  Remains inpatient appropriate because:IV treatments appropriate due to intensity of illness or inability to take PO and Inpatient level of care appropriate due to severity of illness  Dispo: The patient is from: Home  Anticipated d/c is to: Home              Anticipated d/c date is: 3 days              Patient currently is not medically stable to d/c. Consultants:   Nephrology  Gastroenterology  Procedures:   ESRD  2D Echo 11/13  IMPRESSIONS  1. Left ventricular ejection fraction, by estimation, is  60 to 65%. The left ventricle has normal function. The left ventricle has no regional wall motion abnormalities. Left ventricular diastolic parameters are consistent with Grade I diastolic dysfunction (impaired relaxation). Elevated left atrial pressure.  2. Right ventricular systolic function is normal. The right ventricular size is normal. There is normal pulmonary artery systolic pressure.  3. The mitral valve is normal in structure. No evidence of mitral valve regurgitation. No evidence of mitral stenosis.  4. The aortic valve is normal in structure. Aortic valve regurgitation is not visualized. No aortic stenosis is present.  5. There is Moderate (Grade III) protruding plaque involving the transverse aorta.  6. The inferior vena cava is normal in size with greater than 50% respiratory variability, suggesting right atrial pressure of 3 mmHg.    Antimicrobials:  Ciprofloxacin 11/13 >> Metronidazole 11/13>>   Subjective: Pt had some rectal bleeding yesterday and apixaban was placed on hold.  He remains with positive orthostatics.   Objective: Vitals:   10/15/20 1730 10/15/20 1750 10/15/20 2148 10/16/20 0531  BP: 115/69 117/72 118/62 127/64  Pulse: 85 82 76 68  Resp:   18 18  Temp:   98.9 F (37.2 C) 99 F (37.2 C)  TempSrc:   Oral Oral  SpO2:   95% 95%  Weight:      Height:        Intake/Output Summary (Last 24 hours) at 10/16/2020 1243 Last data filed at 10/16/2020 0533 Gross per 24 hour  Intake 240 ml  Output 600 ml  Net -360 ml   Filed Weights   10/13/20 1252 10/13/20 2120 10/14/20 0204  Weight: 74.5 kg 74.1 kg 74.1 kg   Examination:  General exam: awake, alert, Appears calm and comfortable  Respiratory system: Clear to auscultation. Respiratory effort normal. Cardiovascular system: normal S1 & S2 heard. No JVD, murmurs, rubs, gallops or clicks. No pedal edema. Gastrointestinal system: Abdomen is nondistended, soft and nontender. No organomegaly or masses felt.  Normal bowel sounds heard. Central nervous system: Alert and oriented. No focal neurological deficits. Extremities: Symmetric 5 x 5 power. Skin: No rashes, lesions or ulcers Psychiatry: Judgement and insight appear normal. Flat affect.   Data Reviewed: I have personally reviewed following labs and imaging studies  CBC: Recent Labs  Lab 10/12/20 1344 10/12/20 1344 10/13/20 0434 10/14/20 0550 10/14/20 2333 10/15/20 0627 10/16/20 0758  WBC 8.3  --  6.0 5.1  --  6.9 6.7  HGB 8.2*   < > 7.8* 6.9* 8.2* 8.3* 6.7*  HCT 27.3*   < > 26.4* 22.8* 26.0* 27.0* 22.4*  MCV 102.6*  --  100.4* 100.0  --  95.4 93.3  PLT 577*  --  573* 511*  --  449* 417*   < > = values in this interval not displayed.    Basic Metabolic Panel: Recent Labs  Lab 10/12/20 1344 10/13/20 0434 10/13/20 1452 10/14/20 0550 10/15/20 0627 10/16/20 0758  NA 131* 131*  --  132* 130* 130*  K 3.4* 3.3*  --  3.6 3.5 3.8  CL 97* 99  --  99 102  99  CO2 23 22  --  23 20* 22  GLUCOSE 152* 95  --  88 99 97  BUN 41* 45*  --  25* 43* 34*  CREATININE 5.49* 5.33*  --  3.18* 4.51* 3.58*  CALCIUM 7.2* 7.2*  --  7.0* 6.9* 6.9*  PHOS  --   --  4.3 3.0 3.3 2.5    GFR: Estimated Creatinine Clearance: 20.1 mL/min (A) (by C-G formula based on SCr of 3.58 mg/dL (H)).  Liver Function Tests: Recent Labs  Lab 10/12/20 1348 10/14/20 0550 10/15/20 0627 10/16/20 0758  AST 14*  --   --   --   ALT 15  --   --   --   ALKPHOS 74  --   --   --   BILITOT 0.6  --   --   --   PROT 5.1*  --   --   --   ALBUMIN 1.9* 2.0*  2.0* 1.8* 1.7*    CBG: Recent Labs  Lab 10/12/20 1344 10/13/20 0635 10/14/20 0535 10/14/20 0737 10/15/20 0536  GLUCAP 139* 88 88 88 98    Recent Results (from the past 240 hour(s))  Respiratory Panel by RT PCR (Flu A&B, Covid) - Nasopharyngeal Swab     Status: None   Collection Time: 10/12/20  3:13 PM   Specimen: Nasopharyngeal Swab  Result Value Ref Range Status   SARS Coronavirus 2 by RT PCR NEGATIVE  NEGATIVE Final    Comment: (NOTE) SARS-CoV-2 target nucleic acids are NOT DETECTED.  The SARS-CoV-2 RNA is generally detectable in upper respiratoy specimens during the acute phase of infection. The lowest concentration of SARS-CoV-2 viral copies this assay can detect is 131 copies/mL. A negative result does not preclude SARS-Cov-2 infection and should not be used as the sole basis for treatment or other patient management decisions. A negative result may occur with  improper specimen collection/handling, submission of specimen other than nasopharyngeal swab, presence of viral mutation(s) within the areas targeted by this assay, and inadequate number of viral copies (<131 copies/mL). A negative result must be combined with clinical observations, patient history, and epidemiological information. The expected result is Negative.  Fact Sheet for Patients:  PinkCheek.be  Fact Sheet for Healthcare Providers:  GravelBags.it  This test is no t yet approved or cleared by the [place] FDA and  has been authorized for detection and/or diagnosis of SARS-CoV-2 by FDA under an Emergency Use Authorization (EUA). This EUA will remain  in effect (meaning this test can be used) for the duration of the COVID-19 declaration under Section 564(b)(1) of the Act, 21 U.S.C. section 360bbb-3(b)(1), unless the authorization is terminated or revoked sooner.     Influenza A by PCR NEGATIVE NEGATIVE Final   Influenza B by PCR NEGATIVE NEGATIVE Final    Comment: (NOTE) The Xpert Xpress SARS-CoV-2/FLU/RSV assay is intended as an aid in  the diagnosis of influenza from Nasopharyngeal swab specimens and  should not be used as a sole basis for treatment. Nasal washings and  aspirates are unacceptable for Xpert Xpress SARS-CoV-2/FLU/RSV  testing.  Fact Sheet for Patients: PinkCheek.be  Fact Sheet for Healthcare  Providers: GravelBags.it  This test is not yet approved or cleared by the [place] FDA and  has been authorized for detection and/or diagnosis of SARS-CoV-2 by  FDA under an Emergency Use Authorization (EUA). This EUA will remain  in effect (meaning this test can be used) for the duration of the  Covid-19 declaration under  Section 564(b)(1) of the Act, 21  U.S.C. section 360bbb-3(b)(1), unless the authorization is  terminated or revoked. Performed at [facility], [address], [place], [place] [address]   MRSA PCR Screening     Status: None   Collection Time: 10/13/20  2:35 PM   Specimen: Nasopharyngeal  Result Value Ref Range Status   MRSA by PCR NEGATIVE NEGATIVE Final    Comment:        The GeneXpert MRSA Assay (FDA approved for NASAL specimens only), is one component of a comprehensive MRSA colonization surveillance program. It is not intended to diagnose MRSA infection nor to guide or monitor treatment for MRSA infections. Performed at [facility], [address], [place], [place] [address]   C Difficile Quick Screen w PCR reflex     Status: None   Collection Time: 10/14/20  2:15 PM   Specimen: Stool  Result Value Ref Range Status   C Diff antigen NEGATIVE NEGATIVE Final   C Diff toxin NEGATIVE NEGATIVE Final   C Diff interpretation No C. difficile detected.  Final    Comment: Performed at [facility], [address], [place], [place] [address]     Radiology Studies: No results found. Scheduled Meds: . sodium chloride   Intravenous Once  . amiodarone  200 mg Oral Daily  . calcitRIOL  0.5 mcg Oral Daily  . Chlorhexidine Gluconate Cloth  6 each Topical Q0600  . ciprofloxacin  500 mg Oral Daily  . feeding supplement (NEPRO CARB STEADY)  237 mL Oral BID BM  . metroNIDAZOLE  500 mg Oral Q8H  . midodrine  5 mg Oral TID  . simvastatin  20 mg Oral QPM  . sodium chloride flush  3 mL Intravenous Q12H   Continuous Infusions: . sodium  chloride    . sodium chloride    . sodium chloride 50 mL/hr at 10/15/20 1817    LOS: 3 days   Time spent: 31 mins  [name] Krauter Wynetta Emery, MD How to contact the [facility] Attending or Consulting provider [place] or covering provider during after hours [place], for this patient?  1. Check the care team in [facility] and look for a) attending/consulting [facility] provider listed and b) the [facility] team listed 2. Log into [email or web address] and use [place]'s universal password to access. If you do not have the password, please contact the hospital operator. 3. Locate the [facility] provider you are looking for under [facility] and page to a number that you can be directly reached. 4. If you still have difficulty reaching the provider, please page the [facility] (Director on Call) for the Hospitalists listed on amion for assistance.  10/16/2020, 12:43 PM

## 2020-10-16 NOTE — Progress Notes (Signed)
CRITICAL VALUE ALERT  Critical Value:  Hgb 6.7  Date & Time Notied:  10/16/2020 @ 09:25am  Provider Notified: Dr. Wynetta Emery  Orders Received/Actions taken: Continuing to monitor

## 2020-10-17 ENCOUNTER — Inpatient Hospital Stay (HOSPITAL_COMMUNITY): Payer: Medicare Other | Admitting: Anesthesiology

## 2020-10-17 ENCOUNTER — Other Ambulatory Visit: Payer: Self-pay

## 2020-10-17 ENCOUNTER — Encounter (HOSPITAL_COMMUNITY)
Admission: EM | Disposition: A | Discharge status: Skilled Nursing Facility | Payer: Self-pay | Source: Home / Self Care | Attending: Internal Medicine

## 2020-10-17 DIAGNOSIS — Z992 Dependence on renal dialysis: Secondary | ICD-10-CM

## 2020-10-17 DIAGNOSIS — D649 Anemia, unspecified: Secondary | ICD-10-CM | POA: Diagnosis not present

## 2020-10-17 DIAGNOSIS — N186 End stage renal disease: Secondary | ICD-10-CM

## 2020-10-17 DIAGNOSIS — K625 Hemorrhage of anus and rectum: Secondary | ICD-10-CM

## 2020-10-17 DIAGNOSIS — R933 Abnormal findings on diagnostic imaging of other parts of digestive tract: Secondary | ICD-10-CM

## 2020-10-17 DIAGNOSIS — K529 Noninfective gastroenteritis and colitis, unspecified: Secondary | ICD-10-CM

## 2020-10-17 DIAGNOSIS — K6389 Other specified diseases of intestine: Secondary | ICD-10-CM

## 2020-10-17 DIAGNOSIS — Z9119 Patient's noncompliance with other medical treatment and regimen: Secondary | ICD-10-CM

## 2020-10-17 HISTORY — PX: COLONOSCOPY WITH PROPOFOL: SHX5780

## 2020-10-17 HISTORY — PX: BIOPSY: SHX5522

## 2020-10-17 LAB — CBC
HCT: 26.9 % — ABNORMAL LOW (ref 39.0–52.0)
HCT: 26 % — ABNORMAL LOW (ref 39.0–52.0)
Hemoglobin: 8.1 g/dL — ABNORMAL LOW (ref 13.0–17.0)
Hemoglobin: 7.8 g/dL — ABNORMAL LOW (ref 13.0–17.0)
MCH: 27.8 pg (ref 26.0–34.0)
MCH: 27.8 pg (ref 26.0–34.0)
MCHC: 30.1 g/dL (ref 30.0–36.0)
MCHC: 30 g/dL (ref 30.0–36.0)
MCV: 92.4 fL (ref 80.0–100.0)
MCV: 92.5 fL (ref 80.0–100.0)
Platelets: 403 10*3/uL — ABNORMAL HIGH (ref 150–400)
Platelets: 410 10*3/uL — ABNORMAL HIGH (ref 150–400)
RBC: 2.91 MIL/uL — ABNORMAL LOW (ref 4.22–5.81)
RBC: 2.81 MIL/uL — ABNORMAL LOW (ref 4.22–5.81)
RDW: 18.8 % — ABNORMAL HIGH (ref 11.5–15.5)
RDW: 18.7 % — ABNORMAL HIGH (ref 11.5–15.5)
WBC: 5.8 10*3/uL (ref 4.0–10.5)
WBC: 5.5 10*3/uL (ref 4.0–10.5)
nRBC: 0 % (ref 0.0–0.2)
nRBC: 0 % (ref 0.0–0.2)

## 2020-10-17 LAB — RENAL FUNCTION PANEL
Albumin: 1.7 g/dL — ABNORMAL LOW (ref 3.5–5.0)
Anion gap: 8 (ref 5–15)
BUN: 43 mg/dL — ABNORMAL HIGH (ref 8–23)
CO2: 20 mmol/L — ABNORMAL LOW (ref 22–32)
Calcium: 7.1 mg/dL — ABNORMAL LOW (ref 8.9–10.3)
Chloride: 102 mmol/L (ref 98–111)
Creatinine, Ser: 4.21 mg/dL — ABNORMAL HIGH (ref 0.61–1.24)
GFR, Estimated: 14 mL/min — ABNORMAL LOW (ref >60)
Glucose, Bld: 97 mg/dL (ref 70–99)
Phosphorus: 3.2 mg/dL (ref 2.5–4.6)
Potassium: 3.7 mmol/L (ref 3.5–5.1)
Sodium: 130 mmol/L — ABNORMAL LOW (ref 135–145)

## 2020-10-17 LAB — MAGNESIUM: Magnesium: 1.5 mg/dL — ABNORMAL LOW (ref 1.7–2.4)

## 2020-10-17 LAB — SEDIMENTATION RATE: Sed Rate: 15 mm/hr (ref 0–16)

## 2020-10-17 LAB — C-REACTIVE PROTEIN: CRP: 8.4 mg/dL — ABNORMAL HIGH (ref <1.0)

## 2020-10-17 SURGERY — COLONOSCOPY WITH PROPOFOL
Anesthesia: General

## 2020-10-17 MED ORDER — SODIUM CHLORIDE 0.9 % IV SOLN: INTRAVENOUS | Status: DC

## 2020-10-17 MED ORDER — LIDOCAINE HCL (CARDIAC) PF 100 MG/5ML IV SOSY
PREFILLED_SYRINGE | INTRAVENOUS | Status: DC | PRN
  Administered 2020-10-17: 50 mg via INTRAVENOUS

## 2020-10-17 MED ORDER — SODIUM CHLORIDE 0.9 % IV SOLN: INTRAVENOUS | Status: DC | PRN

## 2020-10-17 MED ORDER — PROPOFOL 500 MG/50ML IV EMUL
INTRAVENOUS | Status: DC | PRN
  Administered 2020-10-17: 100 ug/kg/min via INTRAVENOUS

## 2020-10-17 MED ORDER — PHENYLEPHRINE 40 MCG/ML (10ML) SYRINGE FOR IV PUSH (FOR BLOOD PRESSURE SUPPORT)
PREFILLED_SYRINGE | INTRAVENOUS | Status: DC | PRN
  Administered 2020-10-17: 80 ug via INTRAVENOUS

## 2020-10-17 MED ORDER — SIMETHICONE 40 MG/0.6ML PO SUSP
ORAL | Status: DC | PRN
  Administered 2020-10-17: 100 mL via ORAL

## 2020-10-17 MED ORDER — SIMETHICONE 80 MG PO CHEW
80.0000 mg | CHEWABLE_TABLET | Freq: Four times a day (QID) | ORAL | Status: AC
  Administered 2020-10-17 – 2020-10-18 (×4): 80 mg via ORAL
  Filled 2020-10-17 (×5): qty 1

## 2020-10-17 MED ORDER — PROPOFOL 10 MG/ML IV BOLUS
INTRAVENOUS | Status: DC | PRN
  Administered 2020-10-17: 70 mg via INTRAVENOUS

## 2020-10-17 NOTE — Progress Notes (Signed)
Physical Therapy Treatment Patient Details Name: [name] MRN: [id number] DOB: November 18, 1951 Today's Date: 10/17/2020    History of Present Illness [name]  is a 69 y.o. male, with past medical history of diabetes mellitus, CKD stage V, recent hospitalization at [facility] for which he started hemodialysis for ESRD on MWF schedule, and diagnosed with A. fib when he started on amiodarone, metoprolol and Eliquis, patient was sent from hemodialysis center for near syncope and orthostasis, patient reports he was discharged last week from [place], he is on hemodialysis center since Monday, he does report syncope on Monday after dialysis, as well he does report orthostasis and near syncope for last couple days which he was started on midodrine by his nephrologist, report this morning prior to dialysis he felt dizzy and lightheaded where he was hypotensive for which they sent him to ED, patient report he is still making good amount of urine daily, reports his weight 2 weeks ago was 29.2 kg, and during most dialysis session was at 76, as well he does report diarrhea x2 weeks, he denies fever, chills or abdominal pain, he does report episodes of vomiting on Wednesday, but no recurrence, overall he reports good oral intake and fluid consumption.    PT Comments    Patient seen for exercises in bed only due to severe diarrhea secondary to taking colonoscopy prep.  Patient demonstrates good return for completing isometric exercises in bed without c/o pain.  Patient encouraged to complete as tolerated throughout day.  Patient will benefit from continued physical therapy in hospital and recommended venue below to increase strength, balance, endurance for safe ADLs and gait.    Follow Up Recommendations  SNF     Equipment Recommendations  None recommended by PT    Recommendations for Other Services       Precautions / Restrictions Precautions Precautions: Fall Restrictions Weight Bearing  Restrictions: No    Mobility  Bed Mobility               General bed mobility comments: held due to severe diarreha  Transfers                    Ambulation/Gait                 Stairs             Wheelchair Mobility    Modified Rankin (Stroke Patients Only)       Balance                                            Cognition Arousal/Alertness: Awake/alert Behavior During Therapy: WFL for tasks assessed/performed Overall Cognitive Status: Within Functional Limits for tasks assessed                                        Exercises Total Joint Exercises Ankle Circles/Pumps: Supine;15 reps;Both;Strengthening;AROM Quad Sets: Supine;15 reps;Both;Strengthening;AROM Gluteal Sets: Supine;15 reps;Both;Strengthening;AROM    General Comments        Pertinent Vitals/Pain Pain Assessment: No/denies pain    Home Living                      Prior Function            PT Goals (  current goals can now be found in the care plan section) Acute Rehab PT Goals Patient Stated Goal: return home after rehab PT Goal Formulation: With patient Time For Goal Achievement: 10/29/20 Potential to Achieve Goals: Good Progress towards PT goals: Progressing toward goals    Frequency    Min 3X/week      PT Plan Current plan remains appropriate    Co-evaluation              AM-PAC PT "6 Clicks" Mobility   Outcome Measure  Help needed turning from your back to your side while in a flat bed without using bedrails?: None Help needed moving from lying on your back to sitting on the side of a flat bed without using bedrails?: A Little Help needed moving to and from a bed to a chair (including a wheelchair)?: A Lot Help needed standing up from a chair using your arms (e.g., wheelchair or bedside chair)?: A Lot Help needed to walk in hospital room?: A Lot Help needed climbing 3-5 steps with a railing? : A  Lot 6 Click Score: 15    End of Session   Activity Tolerance: Patient tolerated treatment well;Patient limited by fatigue;Other (comment) (limited by severe diarrhea) Patient left: in bed;with call bell/phone within reach Nurse Communication: Mobility status PT Visit Diagnosis: Unsteadiness on feet (R26.81);Other abnormalities of gait and mobility (R26.89);Muscle weakness (generalized) (M62.81)     Time: 6144-3154 PT Time Calculation (min) (ACUTE ONLY): 15 min  Charges:  $Therapeutic Exercise: 8-22 mins                     10:11 AM, 10/17/20 Lonell Grandchild, MPT Physical Therapist with [facility] 336 [phone] office [phone] mobile phone

## 2020-10-17 NOTE — Anesthesia Procedure Notes (Signed)
Date/Time: 10/17/2020 2:50 PM Performed by: Orlie Dakin, CRNA Pre-anesthesia Checklist: Patient identified, Emergency Drugs available, Suction available and Patient being monitored Patient Re-evaluated:Patient Re-evaluated prior to induction Oxygen Delivery Method: Nasal cannula Induction Type: IV induction Placement Confirmation: positive ETCO2

## 2020-10-17 NOTE — Anesthesia Postprocedure Evaluation (Signed)
Anesthesia Post Note  Patient: [name]  Procedure(s) Performed: COLONOSCOPY WITH PROPOFOL (N/A ) BIOPSY  Patient location during evaluation: PACU Anesthesia Type: General Level of consciousness: awake, oriented, awake and alert and patient cooperative Pain management: pain level controlled Vital Signs Assessment: post-procedure vital signs reviewed and stable Respiratory status: spontaneous breathing, respiratory function stable and nonlabored ventilation Cardiovascular status: blood pressure returned to baseline and stable Postop Assessment: no headache and no backache Anesthetic complications: no   No complications documented.   Last Vitals:  Vitals:   10/17/20 0432 10/17/20 1347  BP: (!) 151/77 132/79  Pulse: 67   Resp: 20 (!) 24  Temp: 36.9 C 36.9 C  SpO2: 96% 95%    Last Pain:  Vitals:   10/17/20 1347  TempSrc: Oral  PainSc: 0-No pain                 Tacy Learn

## 2020-10-17 NOTE — Op Note (Signed)
[facility] Patient Name: [name] Procedure Date: 10/17/2020 2:24 PM MRN: [id number] Date of Birth: May 03, 1951 Attending MD: Hildred Laser , MD CSN: [id number] Age: 69 Admit Type: Inpatient Procedure:                Colonoscopy Indications:              Rectal bleeding, Abnormal CT of the GI tract Providers:                Hildred Laser, MD, Lurline Del, RN, Lambert Mody, Aram Candela Referring MD:             Antonieta Pert, MD Medicines:                Propofol per Anesthesia Complications:            No immediate complications. Estimated Blood Loss:     Estimated blood loss was minimal. Procedure:                Pre-Anesthesia Assessment:                           - Prior to the procedure, a History and Physical                            was performed, and patient medications and                            allergies were reviewed. The patient's tolerance of                            previous anesthesia was also reviewed. The risks                            and benefits of the procedure and the sedation                            options and risks were discussed with the patient.                            All questions were answered, and informed consent                            was obtained. Prior Anticoagulants: The patient                            last took Eliquis (apixaban) 5 days prior to the                            procedure. ASA Grade Assessment: III - A patient                            with severe systemic disease. After reviewing the  risks and benefits, the patient was deemed in                            satisfactory condition to undergo the procedure.                           After obtaining informed consent, the colonoscope                            was passed under direct vision. Throughout the                            procedure, the patient's blood pressure, pulse, and                             oxygen saturations were monitored continuously. The                            [id number]-[id number] ([id number]) scope was introduced through                            the anus and advanced to the the splenic flexure                            for evaluation. This was the intended extent. The                            colonoscopy was technically difficult and complex                            due to poor bowel prep. The patient tolerated the                            procedure well. The quality of the bowel                            preparation was poor. {skip}descending and sigmoid                            colon were photographed. Scope In: 2:48:13 PM Scope Out: 3:11:39 PM Total Procedure Duration: 0 hours 23 minutes 26 seconds  Findings:      The perianal and digital rectal examinations were normal.      Hematin (altered blood/coffee-ground-like material) was found in the       rectum, in the sigmoid colon, in the descending colon and at the splenic       flexure.      An area of moderately congested mucosa with ulceration was found in the       sigmoid colon and in the descending colon. This was biopsied with a cold       forceps for histology. The pathology specimen from descending colon was       placed into Bottle Number 1. The pathology specimen from sigmoid colon       was placed into Bottle Number 2.  Retroflexion was not performed because of poor prep.. Impression:               - Preparation of the colon was poor3 leading to                            incomplete exam.                           - Blood in the rectum, in the sigmoid colon, in the                            descending colon and at the splenic flexure.                           - Colitis involving descending and sigmoid colon.                            Biopsied. Moderate Sedation:      Per Anesthesia Care Recommendation:           - Return patient to hospital ward for ongoing care.                            - Clear liquid diet today.                           - Continue present medications.                           - Sed rate, CRP.                           - Await pathology results. Procedure Code(s):        --- Professional ---                           [phone], 45, Colonoscopy, flexible; with biopsy,                            single or multiple Diagnosis Code(s):        --- Professional ---                           K62.5, Hemorrhage of anus and rectum                           K92.2, Gastrointestinal hemorrhage, unspecified                           K63.89, Other specified diseases of intestine                           R93.3, Abnormal findings on diagnostic imaging of                            other parts of digestive tract CPT copyright 2019 American Medical Association. All rights reserved. The  codes documented in this report are preliminary and upon coder review may  be revised to meet current compliance requirements. Hildred Laser, MD Hildred Laser, MD 10/17/2020 3:32:58 PM This report has been signed electronically. Number of Addenda: 0

## 2020-10-17 NOTE — TOC Progression Note (Signed)
Transition of Care [facility]) - Progression Note   Patient Details  Name: [name] MRN: [id number] Date of Birth: 1951/02/21  Transition of Care [facility]) CM/SW [place], LCSW Phone Number: 10/17/2020, 10:52 AM  Clinical Narrative: Patient will discharge to [facility] when medically ready. [facility] will transport patient to dialysis. TOC to follow.  Expected Discharge Plan: [place] Barriers to Discharge: Continued Medical Work up  Expected Discharge Plan and Services Expected Discharge Plan: [place] arrangements for the past 2 months: Single Family Home  Readmission Risk Interventions No flowsheet data found.

## 2020-10-17 NOTE — Progress Notes (Signed)
GI Inpatient Follow-up Note  Subjective: currently prepping for colonoscopy, vomited about 4 cupfuls of prep. Having rectal bleeding and clear liquid output without obvious liquid brown stool. Denies any change in abd pain. Care discussed w/ RN.  Scheduled Inpatient Medications:  . sodium chloride   Intravenous Once  . amiodarone  200 mg Oral Daily  . calcitRIOL  0.5 mcg Oral Daily  . Chlorhexidine Gluconate Cloth  6 each Topical Q0600  . ciprofloxacin  500 mg Oral Daily  . feeding supplement (NEPRO CARB STEADY)  237 mL Oral BID BM  . metroNIDAZOLE  500 mg Oral Q8H  . midodrine  5 mg Oral TID  . simvastatin  20 mg Oral QPM  . sodium chloride flush  3 mL Intravenous Q12H    Continuous Inpatient Infusions:   . sodium chloride    . sodium chloride    . sodium chloride 50 mL/hr at 10/15/20 1817    PRN Inpatient Medications:  sodium chloride, sodium chloride, alteplase, alum & mag hydroxide-simeth, diphenoxylate-atropine, heparin, ondansetron (ZOFRAN) IV, prochlorperazine  Review of Systems: Constitutional: Weight is stable.  Eyes: No changes in vision. ENT: No oral lesions, sore throat.  GI: see HPI.  Heme/Lymph: No easy bruising.  CV: No chest pain.  GU: No hematuria.  Integumentary: No rashes.  Neuro: No headaches.  Psych: No depression/anxiety.  Endocrine: No heat/cold intolerance.  Allergic/Immunologic: No urticaria.  Resp: No cough, SOB.  Musculoskeletal: No joint swelling.    Physical Examination: BP (!) 151/77 (BP Location: Right Arm)   Pulse 67   Temp 98.4 F (36.9 C)   Resp 20   Ht 6\' 2"  (1.88 m)   Wt 81.3 kg   SpO2 96%   BMI 23.01 kg/m  Gen: NAD, alert and oriented x 4 HEENT: PEERLA, EOMI, Neck: supple, no JVD or thyromegaly Chest: CTA bilaterally, no wheezes, crackles, or other adventitious sounds CV: RRR, no m/g/c/r Abd: soft, NT, ND, +BS in all four quadrants; no HSM, guarding, ridigity, or rebound tenderness Ext: mild chronic right ankle  edema Skin: no rash or lesions noted Lymph: no LAD  Data: Lab Results  Component Value Date   WBC 5.8 10/17/2020   HGB 8.1 (L) 10/17/2020   HCT 26.9 (L) 10/17/2020   MCV 92.4 10/17/2020   PLT 403 (H) 10/17/2020   Recent Labs  Lab 10/16/20 0758 10/16/20 2157 10/17/20 0645  HGB 6.7* 8.3* 8.1*   Lab Results  Component Value Date   NA 130 (L) 10/17/2020   K 3.7 10/17/2020   CL 102 10/17/2020   CO2 20 (L) 10/17/2020   BUN 43 (H) 10/17/2020   CREATININE 4.21 (H) 10/17/2020   Lab Results  Component Value Date   ALT 15 10/12/2020   AST 14 (L) 10/12/2020   ALKPHOS 74 10/12/2020   BILITOT 0.6 10/12/2020   No results for input(s): APTT, INR, PTT in the last 168 hours.  CT a/p 10/12/20 - non contrast (CKD)- IMPRESSION: 1. Rectal wall thickening with redundant dilated sigmoid colon extending up into the upper abdomen, but without overt volvulus. There is also wall thickening in the descending colon, ascending colon, and sigmoid colon. There is edema in the sigmoid colon mesentery and to a lesser extent in the central bowel mesentery. The appearance is nonspecific but could be due to colitis, inflammatory bowel disease or inflammatory bowel disease; a component of functional colonic abnormalities not excluded given the moderately dilated appearance. 2. Small bilateral pleural effusions. 3. Low-density blood pool suggests  anemia. 4. Cholelithiasis. 5. Bilateral nonobstructive nephrolithiasis. 6. Mild prostatomegaly. 7. Degenerative disc disease at L2-3 and L5-S1 with endplate irregularity and endplate sclerosis especially at L2-3. This has worsened compared to 06/13/2018. 8. Contrast medium in the distal esophagus suggesting dysmotility or reflux. 9. Aortic atherosclerosis.  Aortic Atherosclerosis (ICD10-I70.0).  Assessment/Plan: [name] is a 69 y.o. male with ESRD on dialysis, afib on Eliquis, admitted 10/12/20 with 2 week history of diarrhea and CT scan (without  contrast) findings of colitis with differentials including infectious vs inflammatory. GI PCR and C diff testing both negative. On empiric ABX for suspected infectious colitis (cipro/flagyl)  Developed rectal bleeding 2 days ago and had 2 episodes of large volume painless BRBPR. Eliquis was stopped yesterday due to rectal bleeding and drop in Hgb to 6.9. Diverticular bleed suspected. He received 2 units of PRBCs. Hgb stable this AM but continues to have bleeding. Vomited part of prep but will attempt colonoscopy this afternoon.    Case discussed w/ Dr Laural Golden    Ronney Asters, [facility]

## 2020-10-17 NOTE — Progress Notes (Signed)
Brief colonoscopy note.  Examination limited to splenic flexure because of poor prep. Rectal mucosa not well seen because it was covered with blood in stool. Abnormal mucosa of sigmoid and descending colon with friability scattered ulcers and loss of vascularity. Biopsies taken from descending and sigmoid colon and submitted separately.  Endoscopic appearance not classical for ulcerative colitis but it could be

## 2020-10-17 NOTE — Progress Notes (Signed)
Patient ID: [name], male   DOB: August 28, 1951, 69 y.o.   MRN: [id number]    S: s/p 2u prbc yesterday. Possible cscope today. Having diarrhea from prep   O:BP (!) 151/77 (BP Location: Right Arm)   Pulse 67   Temp 98.4 F (36.9 C)   Resp 20   Ht 6\' 2"  (1.88 m)   Wt 81.3 kg   SpO2 96%   BMI 23.01 kg/m   Intake/Output Summary (Last 24 hours) at 10/17/2020 0805 Last data filed at 10/17/2020 0300 Gross per 24 hour  Intake 1405 ml  Output 1400 ml  Net 5 ml   Intake/Output: I/O last 3 completed shifts: In: 6468 [P.O.:840; I.V.:250; Blood:315] Out: 1900 [Urine:1900]  Intake/Output this shift:  No intake/output data recorded. Weight change:  Gen: NAD CVS: RRR, no rub Resp: cta bl Abd: +BS, soft, Nt/ND Ext: trace edema of bilateral ankles Neuro: speech clear and coherent, moves all ext spontaneously Access: RIJ TDC c/d/i  Recent Labs  Lab 10/12/20 1344 10/12/20 1348 10/13/20 0434 10/13/20 1452 10/14/20 0550 10/15/20 0627 10/16/20 0758 10/17/20 0645  NA 131*  --  131*  --  132* 130* 130* 130*  K 3.4*  --  3.3*  --  3.6 3.5 3.8 3.7  CL 97*  --  99  --  99 102 99 102  CO2 23  --  22  --  23 20* 22 20*  GLUCOSE 152*  --  95  --  88 99 97 97  BUN 41*  --  45*  --  25* 43* 34* 43*  CREATININE 5.49*  --  5.33*  --  3.18* 4.51* 3.58* 4.21*  ALBUMIN  --  1.9*  --   --  2.0*  2.0* 1.8* 1.7* 1.7*  CALCIUM 7.2*  --  7.2*  --  7.0* 6.9* 6.9* 7.1*  PHOS  --   --   --  4.3 3.0 3.3 2.5 3.2  AST  --  14*  --   --   --   --   --   --   ALT  --  15  --   --   --   --   --   --    Liver Function Tests: Recent Labs  Lab 10/12/20 1348 10/14/20 0550 10/15/20 0627 10/16/20 0758 10/17/20 0645  AST 14*  --   --   --   --   ALT 15  --   --   --   --   ALKPHOS 74  --   --   --   --   BILITOT 0.6  --   --   --   --   PROT 5.1*  --   --   --   --   ALBUMIN 1.9*   < > 1.8* 1.7* 1.7*   < > = values in this interval not displayed.   Recent Labs  Lab 10/12/20 1348  LIPASE 20    No results for input(s): AMMONIA in the last 168 hours. CBC: Recent Labs  Lab 10/13/20 0434 10/13/20 0434 10/14/20 0550 10/14/20 2333 10/15/20 0627 10/15/20 0627 10/16/20 0758 10/16/20 2157 10/17/20 0645  WBC 6.0   < > 5.1  --  6.9  --  6.7  --  5.8  HGB 7.8*   < > 6.9*   < > 8.3*   < > 6.7* 8.3* 8.1*  HCT 26.4*   < > 22.8*   < > 27.0*   < >  22.4* 26.8* 26.9*  MCV 100.4*  --  100.0  --  95.4  --  93.3  --  92.4  PLT 573*   < > 511*  --  449*  --  417*  --  403*   < > = values in this interval not displayed.   Cardiac Enzymes: No results for input(s): CKTOTAL, CKMB, CKMBINDEX, TROPONINI in the last 168 hours. CBG: Recent Labs  Lab 10/12/20 1344 10/13/20 0635 10/14/20 0535 10/14/20 0737 10/15/20 0536  GLUCAP 139* 88 88 88 98    Iron Studies:  No results for input(s): IRON, TIBC, TRANSFERRIN, FERRITIN in the last 72 hours. Studies/Results: No results found. . sodium chloride   Intravenous Once  . amiodarone  200 mg Oral Daily  . calcitRIOL  0.5 mcg Oral Daily  . Chlorhexidine Gluconate Cloth  6 each Topical Q0600  . ciprofloxacin  500 mg Oral Daily  . feeding supplement (NEPRO CARB STEADY)  237 mL Oral BID BM  . metroNIDAZOLE  500 mg Oral Q8H  . midodrine  5 mg Oral TID  . simvastatin  20 mg Oral QPM  . sodium chloride flush  3 mL Intravenous Q12H    BMET    Component Value Date/Time   NA 130 (L) 10/17/2020 0645   K 3.7 10/17/2020 0645   CL 102 10/17/2020 0645   CO2 20 (L) 10/17/2020 0645   GLUCOSE 97 10/17/2020 0645   BUN 43 (H) 10/17/2020 0645   CREATININE 4.21 (H) 10/17/2020 0645   CALCIUM 7.1 (L) 10/17/2020 0645   GFRNONAA 14 (L) 10/17/2020 0645   GFRAA 19 (L) 06/15/2018 0407   CBC    Component Value Date/Time   WBC 5.8 10/17/2020 0645   RBC 2.91 (L) 10/17/2020 0645   HGB 8.1 (L) 10/17/2020 0645   HCT 26.9 (L) 10/17/2020 0645   PLT 403 (H) 10/17/2020 0645   MCV 92.4 10/17/2020 0645   MCH 27.8 10/17/2020 0645   MCHC 30.1 10/17/2020 0645    RDW 18.8 (H) 10/17/2020 0645   LYMPHSABS 0.6 (L) 10/05/2020 1850   MONOABS 1.2 (H) 10/05/2020 1850   EOSABS 0.0 10/05/2020 1850   BASOSABS 0.0 10/05/2020 1850     Assessment/Plan:  1. Syncope/hypotension- presumably volume depletion due to diarrhea and UF with HD, but despite IVF's and no UF with HD, he had another episode of symptomatic orthostasis. 1. ECHO with preserved LV function and grade I diastolic dysfunction.  No LVH. 2. Continue with IVF's but need to be cautious as he now has some ankle edema, would have a low threshold to stop this and utilize lasix if there is any respiratory compromise (still makes urine) 3. Continue to keep even with HD for now.   2. Colitis- GI following and on cipro/flagyl.  Possible colonoscopy to determine if this is infectious or related to inflammatory process such as ulcerative colitis or Crohn's disease.  C diff negative and GI panel neg. Cscope planned for today 3. ESRD new start on MWF at [facility].  For HD today and will keep even again.  No heparin. Next HD Friday 4. Anemia: of CKD- s/p blood transfusion.  Follow h/H, no heparin with HD.  Possible colonoscopy today.  GI following.  5. 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  Resume calcitriol 0.48mcg daily (was on as an outpatient). Corrected calcium ~8.9   6. Nutrition: per primary, push protein 7. Hypertension: stable.  Improved with blood. 8. Vascular access- will need to f/u with Dr. Donnetta Hutching once  stable for discharge. 9. A fib- on amiodarone and eliquis 10. Hyponatremia. Suspecting from orthostasis and volume. 137Na bath with HD   Gean Quint, MD [facility]

## 2020-10-17 NOTE — Procedures (Signed)
   HEMODIALYSIS TREATMENT NOTE:  Uneventful 3 hour heparin-free HD completed via RIJ TDC. Kept even / no fluid removed as per order.  All blood was returned.  Rockwell Alexandria, RN

## 2020-10-17 NOTE — Progress Notes (Signed)
PROGRESS NOTE    [name]  GUR:[id number] DOB: 04/24/1951 DOA: 10/12/2020 PCP: Patient, No Pcp Per   Chief Complaint  Patient presents with  . Near Syncope   Brief Narrative: 69-year-old male with history of diabetes mellitus, CKD stage V started recently on dialysis Monday Wednesday Friday and still making urine, recent A. fib and was restarted on amiodarone metoprolol and Eliquis was sent from [facility] center for new syncopal event and orthostasis.  As per report he was discharged last week from [place], he is on hemodialysis center since Monday, he does report syncope on Monday after dialysis, as well he does report orthostasis and near syncope for last couple days which he was started on midodrine by his nephrologist, report this morning prior to dialysis he felt dizzy and lightheaded where he was hypotensive for which they sent him to ED, patient report he is still making good amount of urine daily, reports his weight 2 weeks ago was 29.2 kg, and during most dialysis session was at 76,as well he does report diarrhea x2 weeks, he denies fever, chills or abdominal pain, he does report episodes of vomiting on Wednesday, but no recurrence, overall he reports good oral intake and fluid consumption.  Patient was noted to be orthostatic127/67 supine, 71/48 standing with no improvement despite receiving 500 cc x 2 bolus, Significant for potassium of 3.2, sodium of 131, nine 8.2, CT abdomen pelvis significant for wall thickening in the rectal area, with redundant dilated sigmoid colon, but no overt volvulus, so [facility] hospital[facility] consulted to admit. Nephrology and GI was consulted.  Subjective: Patient was having nausea difficulty with tolerating colon prep.  Complains of ongoing bloody stool Reports she has had diarrhea for couple of weeks but he started having blood in stool since yesterday   Assessment & Plan:  Near syncopal events with orthostatic hypotension, on midodrine, recurrent syncope  with orthostatic hypotension. Suspected to be from intravascular volume depletion acute blood loss anemia.  Status post 2 unit PRBC, and also received IV fluids.  Tolerating this morning no shortness of breath.  Echo with preserved LV function and grade 1 diastolic dysfunction and no LVH 10/13/20. Continue IV fluids and caution due to some ankle edema.  Low threshold to stop and utilize Lasix if there are is any respiratory issues as he makes urine.  Colitis/rectal bleeding with acute blood loss anemia with recent diarrhea: Empirically placed on Cipro and Flagyl.  He has had chronic diarrhea and recent work-up w/ GI panel and C. difficile are negative from 11/14 and 11/15. Underwent colonoscopy today-discussed with GI, limited to splenic flexure because of poor prep rectal mucosa not well seen because it was covered with blood in the stool noted to have abdominal mucosa in sigmoid and descending colon with friability scattered ulcers and loss of vascularity biopsies were taken.  ESR and CRP ordered.Repeat serial H&H  Acute blood loss anemia due to rectal bleeding: Status post 2 units PRBC, hemoglobin stable 8 g range.  Monitor serial H&H and transfuse as needed. Recent Labs  Lab 10/14/20 2333 10/15/20 0627 10/16/20 0758 10/16/20 2157 10/17/20 0645  HGB 8.2* 8.3* 6.7* 8.3* 8.1*  HCT 26.0* 27.0* 22.4* 26.8* 26.9*   ESRD started recently on dialysis MWF, for HD today,still making urine.  Nephrology is on consult.  Continue to monitor labs, fluid status.   Recent paroxysmal A. fib and was started on amiodarone metoprolol and Eliquis: Holding anticoagulations and antihypertensive.  Continue amidarone.Monitor in telemetry.  Hyponatremia in the  setting of orthostasis volume depletion.  Monitor  Hyperlipidemia continue home statin  Low albumin: Dietitian consult. Hypocalcemia due to low albumin.  Nutrition: Diet Order            Diet clear liquid Room service appropriate? Yes; Fluid consistency:  Thin  Diet effective now                  Body mass index is 23.01 kg/m.  Pressure Ulcer: Pressure Injury 10/13/20 Sacrum Circumferential Stage 2 -  Partial thickness loss of dermis presenting as a shallow open injury with a red, pink wound bed without slough. (Active)  10/13/20 1330  Location: Sacrum  Location Orientation: Circumferential  Staging: Stage 2 -  Partial thickness loss of dermis presenting as a shallow open injury with a red, pink wound bed without slough.  Wound Description (Comments):   Present on Admission: Yes    DVT prophylaxis: Place and maintain sequential compression device Start: 10/15/20 1209 Code Status:   Code Status: Full Code  Family Communication: plan of care discussed with patient at bedside.  Status is: Inpatient Remains inpatient appropriate because:IV treatments appropriate due to intensity of illness or inability to take PO and Inpatient level of care appropriate due to severity of illness  Dispo:  Patient From: Home  Planned Disposition: [place]  Expected discharge date: 12/20/19  Medically stable for discharge: No  Consultants:see note  Procedures:see note  Culture/Microbiology No results found for: SDES, SPECREQUEST, CULT, REPTSTATUS  Other culture-see note  Medications: Scheduled Meds: . sodium chloride   Intravenous Once  . amiodarone  200 mg Oral Daily  . calcitRIOL  0.5 mcg Oral Daily  . Chlorhexidine Gluconate Cloth  6 each Topical Q0600  . ciprofloxacin  500 mg Oral Daily  . feeding supplement (NEPRO CARB STEADY)  237 mL Oral BID BM  . metroNIDAZOLE  500 mg Oral Q8H  . midodrine  5 mg Oral TID  . simethicone  80 mg Oral QID  . simvastatin  20 mg Oral QPM  . sodium chloride flush  3 mL Intravenous Q12H   Continuous Infusions: . sodium chloride    . sodium chloride    . sodium chloride 50 mL/hr at 10/17/20 1150    Antimicrobials: Anti-infectives (From admission, onward)   Start     Dose/Rate Route  Frequency Ordered Stop   10/13/20 1400  metroNIDAZOLE (FLAGYL) tablet 500 mg        500 mg Oral Every 8 hours 10/13/20 1215     10/13/20 1230  ciprofloxacin (CIPRO) tablet 500 mg        500 mg Oral Daily 10/13/20 1215       Objective: Vitals: Today's Vitals   10/17/20 0432 10/17/20 0800 10/17/20 1347 10/17/20 1515  BP: (!) 151/77  132/79 (!) 82/58  Pulse: 67     Resp: 20  (!) 24 (!) 22  Temp: 98.4 F (36.9 C)  98.4 F (36.9 C) 97.9 F (36.6 C)  TempSrc:   Oral   SpO2: 96%  95% 96%  Weight: 81.3 kg     Height:      PainSc:  0-No pain 0-No pain 0-No pain    Intake/Output Summary (Last 24 hours) at 10/17/2020 1543 Last data filed at 10/17/2020 1510 Gross per 24 hour  Intake 1775 ml  Output 1750 ml  Net 25 ml   Filed Weights   10/13/20 2120 10/14/20 0204 10/17/20 0432  Weight: 74.1 kg 74.1 kg 81.3 kg  Weight change:   Intake/Output from previous day: 11/16 0701 - 11/17 0700 In: 1405 [P.O.:840; I.V.:250; Blood:315] Out: 1400 [Urine:1400] Intake/Output this shift: Total I/O In: 850 [I.V.:850] Out: 950 [Urine:950]  Examination: General exam: AAOx3,NAD, weak appearing.was nauseas and throwing up this am after colon prep. HEENT:Oral mucosa moist, Ear/Nose WNL grossly,dentition normal. Respiratory system: bilaterally clear,no wheezing or crackles,no use of accessory muscle, non tender.Hd catheter in chest. Cardiovascular system: S1 & S2 +,regular, No JVD. Gastrointestinal system: Abdomen soft, NT,ND, BS+. Nervous System:Alert, awake, moving extremities and grossly nonfocal Extremities: No edema, distal peripheral pulses palpable.  Skin: No rashes,no icterus. MSK: Normal muscle bulk,tone, power  Data Reviewed: I have personally reviewed following labs and imaging studies CBC: Recent Labs  Lab 10/13/20 0434 10/13/20 0434 10/14/20 0550 10/14/20 0550 10/14/20 2333 10/15/20 0627 10/16/20 0758 10/16/20 2157 10/17/20 0645  WBC 6.0  --  5.1  --   --  6.9 6.7  --   5.8  HGB 7.8*   < > 6.9*   < > 8.2* 8.3* 6.7* 8.3* 8.1*  HCT 26.4*   < > 22.8*   < > 26.0* 27.0* 22.4* 26.8* 26.9*  MCV 100.4*  --  100.0  --   --  95.4 93.3  --  92.4  PLT 573*  --  511*  --   --  449* 417*  --  403*   < > = values in this interval not displayed.   Basic Metabolic Panel: Recent Labs  Lab 10/13/20 0434 10/13/20 1452 10/14/20 0550 10/15/20 0627 10/16/20 0758 10/17/20 0645  NA 131*  --  132* 130* 130* 130*  K 3.3*  --  3.6 3.5 3.8 3.7  CL 99  --  99 102 99 102  CO2 22  --  23 20* 22 20*  GLUCOSE 95  --  88 99 97 97  BUN 45*  --  25* 43* 34* 43*  CREATININE 5.33*  --  3.18* 4.51* 3.58* 4.21*  CALCIUM 7.2*  --  7.0* 6.9* 6.9* 7.1*  MG  --   --   --   --   --  1.5*  PHOS  --  4.3 3.0 3.3 2.5 3.2   GFR: Estimated Creatinine Clearance: 18.8 mL/min (A) (by C-G formula based on SCr of 4.21 mg/dL (H)). Liver Function Tests: Recent Labs  Lab 10/12/20 1348 10/14/20 0550 10/15/20 0627 10/16/20 0758 10/17/20 0645  AST 14*  --   --   --   --   ALT 15  --   --   --   --   ALKPHOS 74  --   --   --   --   BILITOT 0.6  --   --   --   --   PROT 5.1*  --   --   --   --   ALBUMIN 1.9* 2.0*  2.0* 1.8* 1.7* 1.7*   Recent Labs  Lab 10/12/20 1348  LIPASE 20   No results for input(s): AMMONIA in the last 168 hours. Coagulation Profile: No results for input(s): INR, PROTIME in the last 168 hours. Cardiac Enzymes: No results for input(s): CKTOTAL, CKMB, CKMBINDEX, TROPONINI in the last 168 hours. BNP (last 3 results) No results for input(s): PROBNP in the last 8760 hours. HbA1C: No results for input(s): HGBA1C in the last 72 hours. CBG: Recent Labs  Lab 10/12/20 1344 10/13/20 0635 10/14/20 0535 10/14/20 0737 10/15/20 0536  GLUCAP 139* 88 88 88 98   Lipid Profile: No  results for input(s): CHOL, HDL, LDLCALC, TRIG, CHOLHDL, LDLDIRECT in the last 72 hours. Thyroid Function Tests: No results for input(s): TSH, T4TOTAL, FREET4, T3FREE, THYROIDAB in the last 72  hours. Anemia Panel: No results for input(s): VITAMINB12, FOLATE, FERRITIN, TIBC, IRON, RETICCTPCT in the last 72 hours. Sepsis Labs: No results for input(s): PROCALCITON, LATICACIDVEN in the last 168 hours.  Recent Results (from the past 240 hour(s))  Respiratory Panel by RT PCR (Flu A&B, Covid) - Nasopharyngeal Swab     Status: None   Collection Time: 10/12/20  3:13 PM   Specimen: Nasopharyngeal Swab  Result Value Ref Range Status   SARS Coronavirus 2 by RT PCR NEGATIVE NEGATIVE Final    Comment: (NOTE) SARS-CoV-2 target nucleic acids are NOT DETECTED.  The SARS-CoV-2 RNA is generally detectable in upper respiratoy specimens during the acute phase of infection. The lowest concentration of SARS-CoV-2 viral copies this assay can detect is 131 copies/mL. A negative result does not preclude SARS-Cov-2 infection and should not be used as the sole basis for treatment or other patient management decisions. A negative result may occur with  improper specimen collection/handling, submission of specimen other than nasopharyngeal swab, presence of viral mutation(s) within the areas targeted by this assay, and inadequate number of viral copies (<131 copies/mL). A negative result must be combined with clinical observations, patient history, and epidemiological information. The expected result is Negative.  Fact Sheet for Patients:  PinkCheek.be  Fact Sheet for Healthcare Providers:  GravelBags.it  This test is no t yet approved or cleared by the [place] FDA and  has been authorized for detection and/or diagnosis of SARS-CoV-2 by FDA under an Emergency Use Authorization (EUA). This EUA will remain  in effect (meaning this test can be used) for the duration of the COVID-19 declaration under Section 564(b)(1) of the Act, 21 U.S.C. section 360bbb-3(b)(1), unless the authorization is terminated or revoked sooner.     Influenza  A by PCR NEGATIVE NEGATIVE Final   Influenza B by PCR NEGATIVE NEGATIVE Final    Comment: (NOTE) The Xpert Xpress SARS-CoV-2/FLU/RSV assay is intended as an aid in  the diagnosis of influenza from Nasopharyngeal swab specimens and  should not be used as a sole basis for treatment. Nasal washings and  aspirates are unacceptable for Xpert Xpress SARS-CoV-2/FLU/RSV  testing.  Fact Sheet for Patients: PinkCheek.be  Fact Sheet for Healthcare Providers: GravelBags.it  This test is not yet approved or cleared by the [place] FDA and  has been authorized for detection and/or diagnosis of SARS-CoV-2 by  FDA under an Emergency Use Authorization (EUA). This EUA will remain  in effect (meaning this test can be used) for the duration of the  Covid-19 declaration under Section 564(b)(1) of the Act, 21  U.S.C. section 360bbb-3(b)(1), unless the authorization is  terminated or revoked. Performed at [facility], [address], [place], [place] [address]   MRSA PCR Screening     Status: None   Collection Time: 10/13/20  2:35 PM   Specimen: Nasopharyngeal  Result Value Ref Range Status   MRSA by PCR NEGATIVE NEGATIVE Final    Comment:        The GeneXpert MRSA Assay (FDA approved for NASAL specimens only), is one component of a comprehensive MRSA colonization surveillance program. It is not intended to diagnose MRSA infection nor to guide or monitor treatment for MRSA infections. Performed at [facility], [address] [place], [name] [address]   C Difficile Quick Screen w PCR  reflex     Status: None   Collection Time: 10/14/20  2:15 PM   Specimen: Stool  Result Value Ref Range Status   C Diff antigen NEGATIVE NEGATIVE Final   C Diff toxin NEGATIVE NEGATIVE Final   C Diff interpretation No C. difficile detected.  Final    Comment: Performed at [facility], [address] [place], [place] [address]  Gastrointestinal Panel  by PCR , Stool     Status: None   Collection Time: 10/15/20 11:02 AM   Specimen: Stool  Result Value Ref Range Status   Campylobacter species NOT DETECTED NOT DETECTED Final   Plesimonas shigelloides NOT DETECTED NOT DETECTED Final   Salmonella species NOT DETECTED NOT DETECTED Final   Yersinia enterocolitica NOT DETECTED NOT DETECTED Final   Vibrio species NOT DETECTED NOT DETECTED Final   Vibrio cholerae NOT DETECTED NOT DETECTED Final   Enteroaggregative E coli (EAEC) NOT DETECTED NOT DETECTED Final   Enteropathogenic E coli (EPEC) NOT DETECTED NOT DETECTED Final   Enterotoxigenic E coli (ETEC) NOT DETECTED NOT DETECTED Final   Shiga like toxin producing E coli (STEC) NOT DETECTED NOT DETECTED Final   Shigella/Enteroinvasive E coli (EIEC) NOT DETECTED NOT DETECTED Final   Cryptosporidium NOT DETECTED NOT DETECTED Final   Cyclospora cayetanensis NOT DETECTED NOT DETECTED Final   Entamoeba histolytica NOT DETECTED NOT DETECTED Final   Giardia lamblia NOT DETECTED NOT DETECTED Final   Adenovirus F40/41 NOT DETECTED NOT DETECTED Final   Astrovirus NOT DETECTED NOT DETECTED Final   Norovirus GI/GII NOT DETECTED NOT DETECTED Final   Rotavirus A NOT DETECTED NOT DETECTED Final   Sapovirus (I, II, IV, and V) NOT DETECTED NOT DETECTED Final    Comment: Performed at [facility], [address], [place], [place] [address]     Radiology Studies: No results found.   LOS: 4 days   Antonieta Pert, MD [facility]  10/17/2020, 3:43 PM

## 2020-10-17 NOTE — Transfer of Care (Signed)
Immediate Anesthesia Transfer of Care Note  Patient: [name]  Procedure(s) Performed: COLONOSCOPY WITH PROPOFOL (N/A ) BIOPSY  Patient Location: PACU  Anesthesia Type:General  Level of Consciousness: awake, alert , oriented and patient cooperative  Airway & Oxygen Therapy: Patient Spontanous Breathing  Post-op Assessment: Report given to RN, Post -op Vital signs reviewed and stable and Patient moving all extremities  Post vital signs: Reviewed and stable  Last Vitals:  Vitals Value Taken Time  BP 82/58 10/17/20 1519  Temp    Pulse 79 10/17/20 1521  Resp 20 10/17/20 1521  SpO2 97 % 10/17/20 1521  Vitals shown include unvalidated device data.  Last Pain:  Vitals:   10/17/20 1347  TempSrc: Oral  PainSc: 0-No pain         Complications: No complications documented.

## 2020-10-17 NOTE — Anesthesia Preprocedure Evaluation (Signed)
Anesthesia Evaluation  Patient identified by MRN, date of birth, ID band Patient awake    Reviewed: Allergy & Precautions, NPO status , Patient's Chart, lab work & pertinent test results  History of Anesthesia Complications Negative for: history of anesthetic complications  Airway Mallampati: II  TM Distance: >3 FB Neck ROM: Full    Dental  (+) Implants, Caps, Dental Advisory Given   Pulmonary neg pulmonary ROS,    Pulmonary exam normal breath sounds clear to auscultation       Cardiovascular Exercise Tolerance: Good  Rhythm:Regular Rate:Tachycardia  12-Oct-2020 13:32:41 [facility]-ED ROUTINE RECORD Sinus rhythm with frequent Premature ventricular complexes Nonspecific ST abnormality Abnormal ECG Artifact Since last tracing PVC new Otherwise no significant change Confirmed by Daleen Bo [phone]) on 10/12/2020 3:25:17 PM   Neuro/Psych  Neuromuscular disease negative psych ROS   GI/Hepatic Neg liver ROS, Bowel prep,Lower GI bleeding   Endo/Other  diabetes, Well Controlled, Type 2, Oral Hypoglycemic Agents  Renal/GU Dialysis and ESRFRenal disease  negative genitourinary   Musculoskeletal negative musculoskeletal ROS (+)   Abdominal   Peds negative pediatric ROS (+)  Hematology  (+) anemia ,   Anesthesia Other Findings   Reproductive/Obstetrics negative OB ROS                             Anesthesia Physical Anesthesia Plan  ASA: III  Anesthesia Plan: General   Post-op Pain Management:    Induction: Intravenous  PONV Risk Score and Plan: TIVA  Airway Management Planned: Nasal Cannula and Natural Airway  Additional Equipment:   Intra-op Plan:   Post-operative Plan:   Informed Consent: I have reviewed the patients History and Physical, chart, labs and discussed the procedure including the risks, benefits and alternatives for the proposed anesthesia with  the patient or authorized representative who has indicated his/her understanding and acceptance.     Dental advisory given  Plan Discussed with: CRNA and Surgeon  Anesthesia Plan Comments:         Anesthesia Quick Evaluation

## 2020-10-18 DIAGNOSIS — R197 Diarrhea, unspecified: Secondary | ICD-10-CM

## 2020-10-18 DIAGNOSIS — K625 Hemorrhage of anus and rectum: Secondary | ICD-10-CM | POA: Diagnosis not present

## 2020-10-18 DIAGNOSIS — D649 Anemia, unspecified: Secondary | ICD-10-CM

## 2020-10-18 LAB — BPAM RBC
Blood Product Expiration Date: 202112062359
Blood Product Expiration Date: 202112092359
Blood Product Expiration Date: 202112102359
Blood Product Expiration Date: 202112142359
ISSUE DATE / TIME: 202111141123
ISSUE DATE / TIME: 202111141433
ISSUE DATE / TIME: 202111161550
Unit Type and Rh: 6200
Unit Type and Rh: 6200
Unit Type and Rh: 6200
Unit Type and Rh: 6200

## 2020-10-18 LAB — BASIC METABOLIC PANEL
Anion gap: 9 (ref 5–15)
BUN: 29 mg/dL — ABNORMAL HIGH (ref 8–23)
CO2: 22 mmol/L (ref 22–32)
Calcium: 7.2 mg/dL — ABNORMAL LOW (ref 8.9–10.3)
Chloride: 101 mmol/L (ref 98–111)
Creatinine, Ser: 2.95 mg/dL — ABNORMAL HIGH (ref 0.61–1.24)
GFR, Estimated: 22 mL/min — ABNORMAL LOW (ref >60)
Glucose, Bld: 86 mg/dL (ref 70–99)
Potassium: 3.7 mmol/L (ref 3.5–5.1)
Sodium: 132 mmol/L — ABNORMAL LOW (ref 135–145)

## 2020-10-18 LAB — TYPE AND SCREEN
ABO/RH(D): A POS
Antibody Screen: NEGATIVE
Unit division: 0
Unit division: 0
Unit division: 0
Unit division: 0

## 2020-10-18 LAB — CBC
HCT: 25.5 % — ABNORMAL LOW (ref 39.0–52.0)
Hemoglobin: 7.7 g/dL — ABNORMAL LOW (ref 13.0–17.0)
MCH: 27.7 pg (ref 26.0–34.0)
MCHC: 30.2 g/dL (ref 30.0–36.0)
MCV: 91.7 fL (ref 80.0–100.0)
Platelets: 384 10*3/uL (ref 150–400)
RBC: 2.78 MIL/uL — ABNORMAL LOW (ref 4.22–5.81)
RDW: 18.5 % — ABNORMAL HIGH (ref 11.5–15.5)
WBC: 5.3 10*3/uL (ref 4.0–10.5)
nRBC: 0 % (ref 0.0–0.2)

## 2020-10-18 MED ORDER — RENA-VITE PO TABS
1.0000 | ORAL_TABLET | Freq: Every day | ORAL | Status: DC
  Administered 2020-10-18 – 2020-10-31 (×14): 1 via ORAL
  Filled 2020-10-18 (×14): qty 1

## 2020-10-18 MED ORDER — ENSURE ENLIVE PO LIQD
237.0000 mL | Freq: Two times a day (BID) | ORAL | Status: DC
  Administered 2020-10-19 – 2020-11-01 (×26): 237 mL via ORAL

## 2020-10-18 MED ORDER — PROSOURCE PLUS PO LIQD
30.0000 mL | Freq: Two times a day (BID) | ORAL | Status: DC
  Administered 2020-10-19 – 2020-10-20 (×3): 30 mL via ORAL
  Filled 2020-10-18 (×11): qty 30

## 2020-10-18 NOTE — Progress Notes (Signed)
Patient ID: RYLAND TUNGATE, male   DOB: 06-Jul-1951, 69 y.o.   MRN: [id number]    S: s/p cscope 11/17, net even. S/p cscope however poor prep. Patient reports ongoing rectal bleeding esp overnight, has not tried getting up.   O:BP 118/74 (BP Location: Right Arm)   Pulse 78   Temp 98.1 F (36.7 C) (Oral)   Resp 20   Ht 6\' 2"  (1.88 m)   Wt 80.7 kg   SpO2 97%   BMI 22.85 kg/m   Intake/Output Summary (Last 24 hours) at 10/18/2020 0831 Last data filed at 10/18/2020 0700 Gross per 24 hour  Intake 564.78 ml  Output 1395 ml  Net -830.22 ml   Intake/Output: I/O last 3 completed shifts: In: 1114.8 [I.V.:1114.8] Out: 2495 [Urine:2495]  Intake/Output this shift:  No intake/output data recorded. Weight change: -0.56 kg   Gen: NAD CVS: RRR, no rub Resp: cta bl Abd: +BS, soft, Nt/ND Ext: very trace edema of bilateral ankles Neuro: speech clear and coherent, moves all ext spontaneously Access: [facility] c/d/i  Recent Labs  Lab 10/12/20 1344 10/12/20 1348 10/13/20 0434 10/13/20 1452 10/14/20 0550 10/15/20 0627 10/16/20 0758 10/17/20 0645 10/18/20 0445  NA 131*  --  131*  --  132* 130* 130* 130* 132*  K 3.4*  --  3.3*  --  3.6 3.5 3.8 3.7 3.7  CL 97*  --  99  --  99 102 99 102 101  CO2 23  --  22  --  23 20* 22 20* 22  GLUCOSE 152*  --  95  --  88 99 97 97 86  BUN 41*  --  45*  --  25* 43* 34* 43* 29*  CREATININE 5.49*  --  5.33*  --  3.18* 4.51* 3.58* 4.21* 2.95*  ALBUMIN  --  1.9*  --   --  2.0*  2.0* 1.8* 1.7* 1.7*  --   CALCIUM 7.2*  --  7.2*  --  7.0* 6.9* 6.9* 7.1* 7.2*  PHOS  --   --   --  4.3 3.0 3.3 2.5 3.2  --   AST  --  14*  --   --   --   --   --   --   --   ALT  --  15  --   --   --   --   --   --   --    Liver Function Tests: Recent Labs  Lab 10/12/20 1348 10/14/20 0550 10/15/20 0627 10/16/20 0758 10/17/20 0645  AST 14*  --   --   --   --   ALT 15  --   --   --   --   ALKPHOS 74  --   --   --   --   BILITOT 0.6  --   --   --   --   PROT 5.1*  --   --    --   --   ALBUMIN 1.9*   < > 1.8* 1.7* 1.7*   < > = values in this interval not displayed.   Recent Labs  Lab 10/12/20 1348  LIPASE 20   No results for input(s): AMMONIA in the last 168 hours. CBC: Recent Labs  Lab 10/15/20 0627 10/15/20 0627 10/16/20 0758 10/16/20 2157 10/17/20 0645 10/17/20 1608 10/18/20 0445  WBC 6.9   < > 6.7  --  5.8 5.5 5.3  HGB 8.3*   < > 6.7*   < >  8.1* 7.8* 7.7*  HCT 27.0*   < > 22.4*   < > 26.9* 26.0* 25.5*  MCV 95.4  --  93.3  --  92.4 92.5 91.7  PLT 449*   < > 417*  --  403* 410* 384   < > = values in this interval not displayed.   Cardiac Enzymes: No results for input(s): CKTOTAL, CKMB, CKMBINDEX, TROPONINI in the last 168 hours. CBG: Recent Labs  Lab 10/12/20 1344 10/13/20 0635 10/14/20 0535 10/14/20 0737 10/15/20 0536  GLUCAP 139* 88 88 88 98    Iron Studies:  No results for input(s): IRON, TIBC, TRANSFERRIN, FERRITIN in the last 72 hours. Studies/Results: No results found. . sodium chloride   Intravenous Once  . amiodarone  200 mg Oral Daily  . calcitRIOL  0.5 mcg Oral Daily  . Chlorhexidine Gluconate Cloth  6 each Topical Q0600  . ciprofloxacin  500 mg Oral Daily  . feeding supplement (NEPRO CARB STEADY)  237 mL Oral BID BM  . metroNIDAZOLE  500 mg Oral Q8H  . midodrine  5 mg Oral TID  . simethicone  80 mg Oral QID  . simvastatin  20 mg Oral QPM  . sodium chloride flush  3 mL Intravenous Q12H    BMET    Component Value Date/Time   NA 132 (L) 10/18/2020 0445   K 3.7 10/18/2020 0445   CL 101 10/18/2020 0445   CO2 22 10/18/2020 0445   GLUCOSE 86 10/18/2020 0445   BUN 29 (H) 10/18/2020 0445   CREATININE 2.95 (H) 10/18/2020 0445   CALCIUM 7.2 (L) 10/18/2020 0445   GFRNONAA 22 (L) 10/18/2020 0445   GFRAA 19 (L) 06/15/2018 0407   CBC    Component Value Date/Time   WBC 5.3 10/18/2020 0445   RBC 2.78 (L) 10/18/2020 0445   HGB 7.7 (L) 10/18/2020 0445   HCT 25.5 (L) 10/18/2020 0445   PLT 384 10/18/2020 0445   MCV  91.7 10/18/2020 0445   MCH 27.7 10/18/2020 0445   MCHC 30.2 10/18/2020 0445   RDW 18.5 (H) 10/18/2020 0445   LYMPHSABS 0.6 (L) 10/05/2020 1850   MONOABS 1.2 (H) 10/05/2020 1850   EOSABS 0.0 10/05/2020 1850   BASOSABS 0.0 10/05/2020 1850     Assessment/Plan:  1. Syncope/hypotension- presumably volume depletion due to diarrhea and UF with HD, but despite IVF's and no UF with HD, he had another episode of symptomatic orthostasis. 1. ECHO with preserved LV function and grade I diastolic dysfunction.  No LVH. 2. On midodrine, if still orthostatic can increase to 10mg  tid 3. Can hold off on fluids for now 2. Colitis- GI following and on cipro/flagyl.  s/p cscope 11/17: poor prep with incomplete exam, blood in the rectum and sigmoid colon, in the descending colon and at the splenic flexure; colitis involving descending and sigmoid colons seen.  Biopsies performed 3. ESRD new start on MWF at [facility].  Next HD tomorrow, will aim to remove some fluid tomorrow (~1L as tolerated) 4. Anemia: of CKD- s/p blood transfusion.  Follow h/H, no heparin with HD.  GI on board, cscope findings as above. 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  Resume calcitriol 0.28mcg daily (was on as an outpatient). Corrected calcium ~8.9 11/17 6. Nutrition: per primary, push protein 7. Hypertension: see #1 8. Vascular access- will need to f/u with Dr. Donnetta Hutching once stable for discharge. 9. A fib- on amiodarone and eliquis (off A/C as of right now) 10. Hyponatremia, improved. [place]  bath with HD   Gean Quint, MD [facility]

## 2020-10-18 NOTE — Progress Notes (Signed)
Physical Therapy Treatment Patient Details Name: [name] MRN: [id number] DOB: Apr 22, 1951 Today's Date: 10/18/2020    History of Present Illness [name]  is a 69 y.o. male, with past medical history of diabetes mellitus, CKD stage V, recent hospitalization at [facility] for which he started hemodialysis for ESRD on MWF schedule, and diagnosed with A. fib when he started on amiodarone, metoprolol and Eliquis, patient was sent from hemodialysis center for near syncope and orthostasis, patient reports he was discharged last week from [place], he is on hemodialysis center since Monday, he does report syncope on Monday after dialysis, as well he does report orthostasis and near syncope for last couple days which he was started on midodrine by his nephrologist, report this morning prior to dialysis he felt dizzy and lightheaded where he was hypotensive for which they sent him to ED, patient report he is still making good amount of urine daily, reports his weight 2 weeks ago was 29.2 kg, and during most dialysis session was at 76, as well he does report diarrhea x2 weeks, he denies fever, chills or abdominal pain, he does report episodes of vomiting on Wednesday, but no recurrence, overall he reports good oral intake and fluid consumption.    PT Comments    Patient limited in OOB mobility today due to experiencing loose and bloody stool. Patient able to tolerate LE there ex and exertion without adverse effects or abnormal response. Demonstrates gross 3/5 BLE strength.  Patient will benefit from continued physical therapy in hospital and recommended venue below to increase strength, balance, endurance for safe ADLs and gait.   Follow Up Recommendations  SNF     Equipment Recommendations  None recommended by PT    Recommendations for Other Services       Precautions / Restrictions Precautions Precautions: Fall Restrictions Weight Bearing Restrictions: No    Mobility  Bed  Mobility Overal bed mobility: Needs Assistance Bed Mobility: Supine to Sit;Sit to Supine     Supine to sit: Min guard Sit to supine: Min guard   General bed mobility comments: held due to severe diarreha  Transfers                 General transfer comment: not performed due to loose stool  Ambulation/Gait             General Gait Details: not performed due to loose stool and Hgb   Stairs             Wheelchair Mobility    Modified Rankin (Stroke Patients Only)       Balance                                            Cognition Arousal/Alertness: Awake/alert Behavior During Therapy: WFL for tasks assessed/performed Overall Cognitive Status: Within Functional Limits for tasks assessed                                        Exercises General Exercises - Lower Extremity Ankle Circles/Pumps: AROM;Both;20 reps Quad Sets: Strengthening;Both;20 reps Gluteal Sets: Strengthening;Both;20 reps Short Arc Quad: Strengthening;Both;20 reps Heel Slides: Strengthening;Both;20 reps Hip ABduction/ADduction: Strengthening;Both;20 reps Straight Leg Raises: Strengthening;Both;20 reps    General Comments        Pertinent Vitals/Pain Pain Assessment:  No/denies pain    Home Living                      Prior Function            PT Goals (current goals can now be found in the care plan section) Acute Rehab PT Goals Patient Stated Goal: return home after rehab PT Goal Formulation: With patient Time For Goal Achievement: 10/29/20 Potential to Achieve Goals: Good Progress towards PT goals: Progressing toward goals    Frequency    Min 3X/week      PT Plan Current plan remains appropriate    Co-evaluation              AM-PAC PT "6 Clicks" Mobility   Outcome Measure                   End of Session   Activity Tolerance: Patient tolerated treatment well;Patient limited by fatigue;Other  (comment) (pt experiencing loose, bloody stool. NSG/MD staff aware) Patient left: in bed;with call bell/phone within reach Nurse Communication: Mobility status PT Visit Diagnosis: Unsteadiness on feet (R26.81);Other abnormalities of gait and mobility (R26.89);Muscle weakness (generalized) (M62.81)     Time: 5643-3295 PT Time Calculation (min) (ACUTE ONLY): 29 min  Charges:  $Therapeutic Exercise: 8-22 mins $Therapeutic Activity: 8-22 mins                     11:17 AM, 10/18/20 M. Sherlyn Lees, PT, DPT Physical Therapist- [place] Office Number: [phone]

## 2020-10-18 NOTE — Progress Notes (Signed)
PROGRESS NOTE    ZANDER INGHAM  FGH:[id number] DOB: 27-Nov-1951 DOA: 10/12/2020 PCP: Patient, No Pcp Per   Chief Complaint  Patient presents with  . Near Syncope   Brief Narrative: 38o yo male with history of diabetes mellitus, CKD stage V started recently on dialysis Monday Wednesday Friday and still making urine, recent A. fib and was restarted on amiodarone metoprolol and Eliquis was sent from Coumad[facility] center for new syncopal event and orthostasis.  As per report he was discharged last week from [place], he is on hemodialysis center since Monday, he does report syncope on Monday after dialysis, as well he does report orthostasis and near syncope for last couple days which he was started on midodrine by his nephrologist, report this morning prior to dialysis he felt dizzy and lightheaded where he was hypotensive for which they sent him to ED, patient report he is still making good amount of urine daily, reports his weight 2 weeks ago was 29.2 kg, and during most dialysis session was at 76,as well he does report diarrhea x2 weeks, he denies fever, chills or abdominal pain, he does report episodes of vomiting on Wednesday, but no recurrence, overall he reports good oral intake and fluid consumption.  Patient was noted to be orthostatic127/67 supine, 71/48 standing with no improvement despite receiving 500 cc x 2 bolus, Significant for potassium of 3.2, sodium of 131, nine 8.2, CT abdomen pelvis significant for wall thickening in the rectal area, with redundant dilated sigmoid colon, but no overt volvulus, so [facility] hospitalist consulted to admit. Nephrology and GI was consulted.  Subjective:  On RA, c/o bloody stool loose ongoing, no abdomnen pain no chest pain, no shortness of breath no leg edema. Resting comfortably otherwise.  Assessment & Plan:  Near syncopal events with orthostatic hypotension, on midodrine, recurrent syncope with orthostatic hypotension:From intravascular volume depletion  blood loss anemia.S/P 2 unit PRBC and IV fluids.  Echo preserved LV function and grade 1 diastolic dysfunction.  Continue on midodrine, continue to monitor orthostatic hypotension.  Colitis/rectal bleeding with acute blood loss anemia with recent diarrhea:Empirically placed on Cipro and Flagyl.He has had chronic diarrhea and recent work-up w/ GI panel and C. difficile are negative from 11/14 and 11/15. Underwent colonoscopy 11/17:colonoscopy is limited to splenic flexure because of poor prep rectal mucosa not well seen because it was covered with blood in the stool noted to have abdominal mucosa in sigmoid and descending colon with friability scattered ulcers and loss of vascularity biopsies were taken.  Biopsy came back severely active chronic nonspecific colitis with ulceration, negative for granulomas or dysplasia typical features of idiopathic inflammatory bowel disease not seen differential includes ischemia, drug effect infection CMV stain pending. ESR normal CRP at 8.4. Await for further gastroenterology input.  Patient is placed on liquid diet if no further plan/procedure from GI.    Acute blood loss anemia due to rectal bleeding:s/p 2 units PRBC hemoglobin slightly downtrending.  Repeat CBC in a.m. May need blood transfusion along with dialysis tomorrow. Recent Labs  Lab 10/16/20 0758 10/16/20 2157 10/17/20 0645 10/17/20 1608 10/18/20 0445  HGB 6.7* 8.3* 8.1* 7.8* 7.7*  HCT 22.4* 26.8* 26.9* 26.0* 25.5*   ESRD started recently on dialysis MWF, no fluid overload.  He does make urine and can utilize Lasix in case of fluid overload.  Discussed nephrology for dialysis tomorrow.   Recent paroxysmal A. fib and was started on amiodarone metoprolol and Eliquis: Holding anticoagulations due to his rectal bleeding.  Continue midodrine.  Hyponatremia in the setting of orthostasis volume depletion. Sodium is 132 stable.   Hyperlipidemia:cont statins.  Low albumin:Dietitian  consulted.  Hypocalcemia:Due to low albumin.monitor.  Nutrition: Diet Order            Diet full liquid Room service appropriate? Yes; Fluid consistency: Thin  Diet effective now                  Body mass index is 22.85 kg/m.  Pressure Ulcer: Pressure Injury 10/13/20 Sacrum Circumferential Stage 2 -  Partial thickness loss of dermis presenting as a shallow open injury with a red, pink wound bed without slough. (Active)  10/13/20 1330  Location: Sacrum  Location Orientation: Circumferential  Staging: Stage 2 -  Partial thickness loss of dermis presenting as a shallow open injury with a red, pink wound bed without slough.  Wound Description (Comments):   Present on Admission: Yes    DVT prophylaxis: Place and maintain sequential compression device Start: 10/15/20 1209 Code Status:   Code Status: Full Code  Family Communication: plan of care discussed with patient at bedside.  Status is: Inpatient Remains inpatient appropriate because:IV treatments appropriate due to intensity of illness or inability to take PO and Inpatient level of care appropriate due to severity of illness  Dispo:  Patient From: Home  Planned Disposition: [place]  Expected discharge date: 12/20/19  Medically stable for discharge: No  Consultants:see note  Procedures:see note  Culture/Microbiology No results found for: SDES, SPECREQUEST, CULT, REPTSTATUS  Other culture-see note  Medications: Scheduled Meds: . sodium chloride   Intravenous Once  . amiodarone  200 mg Oral Daily  . calcitRIOL  0.5 mcg Oral Daily  . Chlorhexidine Gluconate Cloth  6 each Topical Q0600  . ciprofloxacin  500 mg Oral Daily  . feeding supplement (NEPRO CARB STEADY)  237 mL Oral BID BM  . metroNIDAZOLE  500 mg Oral Q8H  . midodrine  5 mg Oral TID  . simvastatin  20 mg Oral QPM  . sodium chloride flush  3 mL Intravenous Q12H   Continuous Infusions: . sodium chloride    . sodium chloride    . sodium  chloride 50 mL/hr at 10/17/20 1150    Antimicrobials: Anti-infectives (From admission, onward)   Start     Dose/Rate Route Frequency Ordered Stop   10/13/20 1400  metroNIDAZOLE (FLAGYL) tablet 500 mg        500 mg Oral Every 8 hours 10/13/20 1215     10/13/20 1230  ciprofloxacin (CIPRO) tablet 500 mg        500 mg Oral Daily 10/13/20 1215       Objective: Vitals: Today's Vitals   10/17/20 2030 10/17/20 2040 10/17/20 2128 10/18/20 0500  BP: 127/76 123/74 118/74   Pulse: 74 77 78   Resp:  16 20   Temp:  98 F (36.7 C) 98.1 F (36.7 C)   TempSrc:  Oral Oral   SpO2:  98% 97%   Weight:    80.7 kg  Height:      PainSc:   0-No pain     Intake/Output Summary (Last 24 hours) at 10/18/2020 1359 Last data filed at 10/18/2020 0958 Gross per 24 hour  Intake 564.78 ml  Output 1170 ml  Net -605.22 ml   Filed Weights   10/14/20 0204 10/17/20 0432 10/18/20 0500  Weight: 74.1 kg 81.3 kg 80.7 kg   Weight change: -0.56 kg  Intake/Output from previous day: 11/17 0701 - 11/18 0700  In: 1114.8 [I.V.:1114.8] Out: 4098 [JXBJY:7829] Intake/Output this shift: Total I/O In: -  Out: 425 [Urine:425]  Examination: General exam:AAOx3,NAD,weak appearing. HEENT:Oral mucosa moist, Ear/Nose WNL grossly, dentition normal. Respiratory system: bilaterally ,no wheezing or crackles,no use of accessory muscle Cardiovascular system:S1 & S2 +, No JVD,. Gastrointestinal system:Abdomen soft, NT,ND, BS+ Nervous System:Alert, awake, moving extremities and grossly nonfocal Extremities:No edema, distal peripheral pulses palpable.  Skin:No rashes,no icterus. FAO:ZHYQMV muscle bulk,tone, power  Data Reviewed: I have personally reviewed following labs and imaging studies CBC: Recent Labs  Lab 10/15/20 0627 10/15/20 0627 10/16/20 0758 10/16/20 2157 10/17/20 0645 10/17/20 1608 10/18/20 0445  WBC 6.9  --  6.7  --  5.8 5.5 5.3  HGB 8.3*   < > 6.7* 8.3* 8.1* 7.8* 7.7*  HCT 27.0*   < > 22.4* 26.8*  26.9* 26.0* 25.5*  MCV 95.4  --  93.3  --  92.4 92.5 91.7  PLT 449*  --  417*  --  403* 410* 384   < > = values in this interval not displayed.   Basic Metabolic Panel: Recent Labs  Lab 10/13/20 0434 10/13/20 1452 10/14/20 0550 10/15/20 0627 10/16/20 0758 10/17/20 0645 10/18/20 0445  NA   < >  --  132* 130* 130* 130* 132*  K   < >  --  3.6 3.5 3.8 3.7 3.7  CL   < >  --  99 102 99 102 101  CO2   < >  --  23 20* 22 20* 22  GLUCOSE   < >  --  88 99 97 97 86  BUN   < >  --  25* 43* 34* 43* 29*  CREATININE   < >  --  3.18* 4.51* 3.58* 4.21* 2.95*  CALCIUM   < >  --  7.0* 6.9* 6.9* 7.1* 7.2*  MG  --   --   --   --   --  1.5*  --   PHOS  --  4.3 3.0 3.3 2.5 3.2  --    < > = values in this interval not displayed.   GFR: Estimated Creatinine Clearance: 26.6 mL/min (A) (by C-G formula based on SCr of 2.95 mg/dL (H)). Liver Function Tests: Recent Labs  Lab 10/12/20 1348 10/14/20 0550 10/15/20 0627 10/16/20 0758 10/17/20 0645  AST 14*  --   --   --   --   ALT 15  --   --   --   --   ALKPHOS 74  --   --   --   --   BILITOT 0.6  --   --   --   --   PROT 5.1*  --   --   --   --   ALBUMIN 1.9* 2.0*  2.0* 1.8* 1.7* 1.7*   Recent Labs  Lab 10/12/20 1348  LIPASE 20   No results for input(s): AMMONIA in the last 168 hours. Coagulation Profile: No results for input(s): INR, PROTIME in the last 168 hours. Cardiac Enzymes: No results for input(s): CKTOTAL, CKMB, CKMBINDEX, TROPONINI in the last 168 hours. BNP (last 3 results) No results for input(s): PROBNP in the last 8760 hours. HbA1C: No results for input(s): HGBA1C in the last 72 hours. CBG: Recent Labs  Lab 10/12/20 1344 10/13/20 0635 10/14/20 0535 10/14/20 0737 10/15/20 0536  GLUCAP 139* 88 88 88 98   Lipid Profile: No results for input(s): CHOL, HDL, LDLCALC, TRIG, CHOLHDL, LDLDIRECT in the last 72 hours. Thyroid Function  Tests: No results for input(s): TSH, T4TOTAL, FREET4, T3FREE, THYROIDAB in the last 72  hours. Anemia Panel: No results for input(s): VITAMINB12, FOLATE, FERRITIN, TIBC, IRON, RETICCTPCT in the last 72 hours. Sepsis Labs: No results for input(s): PROCALCITON, LATICACIDVEN in the last 168 hours.  Recent Results (from the past 240 hour(s))  Respiratory Panel by RT PCR (Flu A&B, Covid) - Nasopharyngeal Swab     Status: None   Collection Time: 10/12/20  3:13 PM   Specimen: Nasopharyngeal Swab  Result Value Ref Range Status   SARS Coronavirus 2 by RT PCR NEGATIVE NEGATIVE Final    Comment: (NOTE) SARS-CoV-2 target nucleic acids are NOT DETECTED.  The SARS-CoV-2 RNA is generally detectable in upper respiratoy specimens during the acute phase of infection. The lowest concentration of SARS-CoV-2 viral copies this assay can detect is 131 copies/mL. A negative result does not preclude SARS-Cov-2 infection and should not be used as the sole basis for treatment or other patient management decisions. A negative result may occur with  improper specimen collection/handling, submission of specimen other than nasopharyngeal swab, presence of viral mutation(s) within the areas targeted by this assay, and inadequate number of viral copies (<131 copies/mL). A negative result must be combined with clinical observations, patient history, and epidemiological information. The expected result is Negative.  Fact Sheet for Patients:  PinkCheek.be  Fact Sheet for Healthcare Providers:  GravelBags.it  This test is no t yet approved or cleared by the [place] FDA and  has been authorized for detection and/or diagnosis of SARS-CoV-2 by FDA under an Emergency Use Authorization (EUA). This EUA will remain  in effect (meaning this test can be used) for the duration of the COVID-19 declaration under Section 564(b)(1) of the Act, 21 U.S.C. section 360bbb-3(b)(1), unless the authorization is terminated or revoked sooner.     Influenza  A by PCR NEGATIVE NEGATIVE Final   Influenza B by PCR NEGATIVE NEGATIVE Final    Comment: (NOTE) The Xpert Xpress SARS-CoV-2/FLU/RSV assay is intended as an aid in  the diagnosis of influenza from Nasopharyngeal swab specimens and  should not be used as a sole basis for treatment. Nasal washings and  aspirates are unacceptable for Xpert Xpress SARS-CoV-2/FLU/RSV  testing.  Fact Sheet for Patients: PinkCheek.be  Fact Sheet for Healthcare Providers: GravelBags.it  This test is not yet approved or cleared by the [place] FDA and  has been authorized for detection and/or diagnosis of SARS-CoV-2 by  FDA under an Emergency Use Authorization (EUA). This EUA will remain  in effect (meaning this test can be used) for the duration of the  Covid-19 declaration under Section 564(b)(1) of the Act, 21  U.S.C. section 360bbb-3(b)(1), unless the authorization is  terminated or revoked. Performed at [facility], [address], [place], [place] [address]   MRSA PCR Screening     Status: None   Collection Time: 10/13/20  2:35 PM   Specimen: Nasopharyngeal  Result Value Ref Range Status   MRSA by PCR NEGATIVE NEGATIVE Final    Comment:        The GeneXpert MRSA Assay (FDA approved for NASAL specimens only), is one component of a comprehensive MRSA colonization surveillance program. It is not intended to diagnose MRSA infection nor to guide or monitor treatment for MRSA infections. Performed at [facility], [address], [place], [place] [address]   C Difficile Quick Screen w PCR reflex     Status: None   Collection Time: 10/14/20  2:15 PM  Specimen: Stool  Result Value Ref Range Status   C Diff antigen NEGATIVE NEGATIVE Final   C Diff toxin NEGATIVE NEGATIVE Final   C Diff interpretation No C. difficile detected.  Final    Comment: Performed at [facility], [address] [place], [place] [address]  Gastrointestinal Panel  by PCR , Stool     Status: None   Collection Time: 10/15/20 11:02 AM   Specimen: Stool  Result Value Ref Range Status   Campylobacter species NOT DETECTED NOT DETECTED Final   Plesimonas shigelloides NOT DETECTED NOT DETECTED Final   Salmonella species NOT DETECTED NOT DETECTED Final   Yersinia enterocolitica NOT DETECTED NOT DETECTED Final   Vibrio species NOT DETECTED NOT DETECTED Final   Vibrio cholerae NOT DETECTED NOT DETECTED Final   Enteroaggregative E coli (EAEC) NOT DETECTED NOT DETECTED Final   Enteropathogenic E coli (EPEC) NOT DETECTED NOT DETECTED Final   Enterotoxigenic E coli (ETEC) NOT DETECTED NOT DETECTED Final   Shiga like toxin producing E coli (STEC) NOT DETECTED NOT DETECTED Final   Shigella/Enteroinvasive E coli (EIEC) NOT DETECTED NOT DETECTED Final   Cryptosporidium NOT DETECTED NOT DETECTED Final   Cyclospora cayetanensis NOT DETECTED NOT DETECTED Final   Entamoeba histolytica NOT DETECTED NOT DETECTED Final   Giardia lamblia NOT DETECTED NOT DETECTED Final   Adenovirus F40/41 NOT DETECTED NOT DETECTED Final   Astrovirus NOT DETECTED NOT DETECTED Final   Norovirus GI/GII NOT DETECTED NOT DETECTED Final   Rotavirus A NOT DETECTED NOT DETECTED Final   Sapovirus (I, II, IV, and V) NOT DETECTED NOT DETECTED Final    Comment: Performed at [facility], [address], [place], [place] [address]    Radiology Studies: No results found.   LOS: 5 days   Antonieta Pert, MD [facility]  10/18/2020, 1:59 PM

## 2020-10-18 NOTE — Progress Notes (Signed)
Subjective:  Feels some better. Less abdominal pain, mostly feels bloated. Three stools today, per nursing, brown stool with dark blood and small clots. Still gets very dizzy if he tries to get out of bed. Two weeks ago, he felt good with no BP issues/lightheadedness. Started dialysis few weeks ago.   Objective: Vital signs in last 24 hours: Temp:  [97.9 F (36.6 C)-98.4 F (36.9 C)] 98.1 F (36.7 C) (11/17 2128) Pulse Rate:  [62-82] 78 (11/17 2128) Resp:  [16-24] 20 (11/17 2128) BP: (82-147)/(58-83) 118/74 (11/17 2128) SpO2:  [95 %-99 %] 97 % (11/17 2128) Weight:  [80.7 kg] 80.7 kg (11/18 0500) Last BM Date: 10/17/20 General:   Alert,  Well-developed, well-nourished, pleasant and cooperative in NAD Head:  Normocephalic and atraumatic. Eyes:  Sclera clear, no icterus.  Abdomen:  Soft, nondistended. Mild left sided tenderness. Somewhat tympanic bowel sounds, without guarding, and without rebound.   Extremities:  Without clubbing, deformity or edema. Neurologic:  Alert and  oriented x4;  grossly normal neurologically. Skin:  Intact without significant lesions or rashes. Psych:  Alert and cooperative. Normal mood and affect.  Intake/Output from previous day: 11/17 0701 - 11/18 0700 In: 1114.8 [I.V.:1114.8] Out: 1695 [Urine:1695] Intake/Output this shift: Total I/O In: -  Out: 425 [Urine:425]  Lab Results: CBC Recent Labs    10/17/20 0645 10/17/20 1608 10/18/20 0445  WBC 5.8 5.5 5.3  HGB 8.1* 7.8* 7.7*  HCT 26.9* 26.0* 25.5*  MCV 92.4 92.5 91.7  PLT 403* 410* 384   BMET Recent Labs    10/16/20 0758 10/17/20 0645 10/18/20 0445  NA 130* 130* 132*  K 3.8 3.7 3.7  CL 99 102 101  CO2 22 20* 22  GLUCOSE 97 97 86  BUN 34* 43* 29*  CREATININE 3.58* 4.21* 2.95*  CALCIUM 6.9* 7.1* 7.2*   LFTs Recent Labs    10/16/20 0758 10/17/20 0645  ALBUMIN 1.7* 1.7*   No results for input(s): LIPASE in the last 72 hours. PT/INR No results for input(s): LABPROT, INR in the  last 72 hours.    Imaging Studies: CT Abdomen Pelvis Wo Contrast  Result Date: 10/12/2020 CLINICAL DATA:  15 pound weight loss in 2 weeks. Nausea and vomiting. EXAM: CT ABDOMEN AND PELVIS WITHOUT CONTRAST TECHNIQUE: Multidetector CT imaging of the abdomen and pelvis was performed following the standard protocol without IV contrast. COMPARISON:  06/13/2018 FINDINGS: Lower chest: Small bilateral pleural effusions. Contrast medium in the distal esophagus suggesting dysmotility or reflux. Low-density blood pool suggests anemia. Hepatobiliary: Dependent gallstones in the gallbladder. Trace perihepatic ascites. Pancreas: Punctate calcifications in the pancreas likely reflecting chronic calcific pancreatitis. Spleen: Unremarkable Adrenals/Urinary Tract: There about 8 nonobstructive right renal calculi measuring up to 0.5 cm in diameter. There are about 13 nonobstructive left renal calculi measuring up to 0.6 cm in diameter. No hydronephrosis, hydroureter, or ureteral calculus identified. Adrenal glands unremarkable. Stomach/Bowel: Rectal wall thickening noted with redundant dilated sigmoid colon extending up into the upper abdomen, but without overt volvulus. There is wall thickening in the descending colon. Gas-filled dilated transverse colon. Mild segmental wall thickening in the ascending colon. There is edema in the sigmoid colon mesentery and to a lesser extent in the central bowel mesentery normal appendix. No pneumatosis. Vascular/Lymphatic: Aortoiliac atherosclerotic vascular disease. Reproductive: Mild prostatomegaly. Other: Presacral edema. Mild scattered ascites. Mesenteric edema especially along the sigmoid mesentery. Musculoskeletal: Degenerative disc disease at L2-3 and L5-S1 with endplate irregularity and endplate sclerosis especially at L2-3. This has worsened compared to 06/13/2018.  IMPRESSION: 1. Rectal wall thickening with redundant dilated sigmoid colon extending up into the upper abdomen, but  without overt volvulus. There is also wall thickening in the descending colon, ascending colon, and sigmoid colon. There is edema in the sigmoid colon mesentery and to a lesser extent in the central bowel mesentery. The appearance is nonspecific but could be due to colitis, inflammatory bowel disease or inflammatory bowel disease; a component of functional colonic abnormalities not excluded given the moderately dilated appearance. 2. Small bilateral pleural effusions. 3. Low-density blood pool suggests anemia. 4. Cholelithiasis. 5. Bilateral nonobstructive nephrolithiasis. 6. Mild prostatomegaly. 7. Degenerative disc disease at L2-3 and L5-S1 with endplate irregularity and endplate sclerosis especially at L2-3. This has worsened compared to 06/13/2018. 8. Contrast medium in the distal esophagus suggesting dysmotility or reflux. 9. Aortic atherosclerosis. Aortic Atherosclerosis (ICD10-I70.0). Electronically Signed   By: Van Clines M.D.   On: 10/12/2020 19:10   DG Chest 2 View  Result Date: 10/12/2020 CLINICAL DATA:  Near-syncope at dialysis. EXAM: CHEST - 2 VIEW COMPARISON:  09/24/2020 FINDINGS: A new right jugular dialysis catheter terminates over the lower SVC. The cardiomediastinal silhouette is unchanged with normal heart size. There are small pleural effusions with mild posterior basilar airspace opacity. The lungs are otherwise clear. No edema or pneumothorax is identified. No acute osseous abnormality is seen. There is persistent gaseous distension of likely large bowel loops in the upper abdomen, also present on the prior study and incompletely evaluated on these chest radiographs. IMPRESSION: 1. Small bilateral pleural effusions with mild basilar atelectasis. 2. Persistent gaseous distension of colon in the upper abdomen, incompletely evaluated. Electronically Signed   By: Logan Bores M.D.   On: 10/12/2020 14:39   [place] UE VEIN MAPPING LEFT  Result Date: 09/20/2020 CLINICAL DATA:  Chronic  renal insufficiency. Please perform bilateral upper extremity venous mapping for dialysis access creation. EXAM: [place] EXTREM UP VEIN MAPPING COMPARISON:  None. FINDINGS: RIGHT ARTERIES Wrist Radial Artery: Size 3.2 mm Waveform Triphasic Wrist Ulnar Artery: Size 2.1 mm Waveform Triphasic Prox. Forearm Radial Artery: Size 3.2 mm Waveform Triphasic Upper Arm Brachial Artery: Size 5.4 mm Waveform Triphasic RIGHT VEINS Forearm Cephalic Vein: Prox 1.9 mm Distal 2.6 mm Depth 2.9 mm Upper Arm Cephalic Vein: Prox 2.4 mm Distal 2.6 mm Depth 4.1 mm Upper Arm Basilic Vein: Prox 1.3 mm Distal 0.9 mm Depth 2.7 mm Upper Arm Brachial Vein: Prox 2.6 mm Distal 2.2 mm Depth 8.1 mm ADDITIONAL RIGHT VEINS Axillary Vein: 3.9 mm Subclavian Vein: Patient: Yes Respiratory Phasicity: Present Internal Jugular Vein: Patent: Yes    Respiratory Phasicity: Present Branches > 2 mm: None _________________________________________________________ LEFT ARTERIES Wrist Radial Artery: Size 3.1 mm Waveform Triphasic Wrist Ulnar Artery: Size 1.7 mm Waveform Triphasic Prox. Forearm Radial Artery: Size 3.1 mm Waveform Triphasic Upper Arm Brachial Artery: Size 4.9 mm Waveform Triphasic LEFT VEINS Forearm Cephalic Vein: Prox 1.7 mm Distal 3.3 mm Depth 2.2 mm Upper Arm Cephalic Vein: Prox 1.3 mm Distal 1.9 mm Depth 4.1 mm Upper Arm Basilic Vein: Prox 1.9 mm Distal 1.9 mm Depth 2.9 mm Upper Arm Brachial Vein: Prox 4.4 mm Distal 3.6 mm Depth 9.8 mm ADDITIONAL LEFT VEINS Axillary Vein:  6.87mm Subclavian Vein: Patient: Yes Respiratory Phasicity: Present Internal Jugular Vein: Patent: Yes    Respiratory Phasicity: Present Branches > 2 mm: None IMPRESSION: 1. Bilateral upper extremity vein mapping as above. 2. No evidence of DVT or SVT. Electronically Signed   By: Sandi Mariscal M.D.   On: 09/20/2020  15:17   [place] UE VEIN MAPPING RIGHT  Result Date: 09/20/2020 CLINICAL DATA:  Chronic renal insufficiency. Please perform bilateral upper extremity venous mapping for  dialysis access creation. EXAM: [place] EXTREM UP VEIN MAPPING COMPARISON:  None. FINDINGS: RIGHT ARTERIES Wrist Radial Artery: Size 3.2 mm Waveform Triphasic Wrist Ulnar Artery: Size 2.1 mm Waveform Triphasic Prox. Forearm Radial Artery: Size 3.2 mm Waveform Triphasic Upper Arm Brachial Artery: Size 5.4 mm Waveform Triphasic RIGHT VEINS Forearm Cephalic Vein: Prox 1.9 mm Distal 2.6 mm Depth 2.9 mm Upper Arm Cephalic Vein: Prox 2.4 mm Distal 2.6 mm Depth 4.1 mm Upper Arm Basilic Vein: Prox 1.3 mm Distal 0.9 mm Depth 2.7 mm Upper Arm Brachial Vein: Prox 2.6 mm Distal 2.2 mm Depth 8.1 mm ADDITIONAL RIGHT VEINS Axillary Vein: 3.9 mm Subclavian Vein: Patient: Yes Respiratory Phasicity: Present Internal Jugular Vein: Patent: Yes    Respiratory Phasicity: Present Branches > 2 mm: None _________________________________________________________ LEFT ARTERIES Wrist Radial Artery: Size 3.1 mm Waveform Triphasic Wrist Ulnar Artery: Size 1.7 mm Waveform Triphasic Prox. Forearm Radial Artery: Size 3.1 mm Waveform Triphasic Upper Arm Brachial Artery: Size 4.9 mm Waveform Triphasic LEFT VEINS Forearm Cephalic Vein: Prox 1.7 mm Distal 3.3 mm Depth 2.2 mm Upper Arm Cephalic Vein: Prox 1.3 mm Distal 1.9 mm Depth 4.1 mm Upper Arm Basilic Vein: Prox 1.9 mm Distal 1.9 mm Depth 2.9 mm Upper Arm Brachial Vein: Prox 4.4 mm Distal 3.6 mm Depth 9.8 mm ADDITIONAL LEFT VEINS Axillary Vein:  6.36mm Subclavian Vein: Patient: Yes Respiratory Phasicity: Present Internal Jugular Vein: Patent: Yes    Respiratory Phasicity: Present Branches > 2 mm: None IMPRESSION: 1. Bilateral upper extremity vein mapping as above. 2. No evidence of DVT or SVT. Electronically Signed   By: Sandi Mariscal M.D.   On: 09/20/2020 15:17   ECHOCARDIOGRAM COMPLETE  Result Date: 10/13/2020    ECHOCARDIOGRAM REPORT   Patient Name:   [name] Date of Exam: 10/13/2020 Medical Rec #:  [id number]   Height:       74.0 in Accession #:    [id number]  Weight:       160.0 lb Date of  Birth:  1951/03/24   BSA:          1.977 m Patient Age:    33 years    BP:           133/73 mmHg Patient Gender: M           HR:           74 bpm. Exam Location:  [facility] Procedure: 2D Echo, Cardiac Doppler and Color Doppler Indications:    Syncope  History:        Patient has no prior history of Echocardiogram examinations.                 Arrythmias:Atrial Fibrillation, Signs/Symptoms:Syncope; Risk                 Factors:Dyslipidemia. ESRD.  Sonographer:    Dustin Flock RDCS Referring Phys: [place]  1. Left ventricular ejection fraction, by estimation, is 60 to 65%. The left ventricle has normal function. The left ventricle has no regional wall motion abnormalities. Left ventricular diastolic parameters are consistent with Grade I diastolic dysfunction (impaired relaxation). Elevated left atrial pressure.  2. Right ventricular systolic function is normal. The right ventricular size is normal. There is normal pulmonary artery systolic pressure.  3. The mitral valve is normal in structure. No evidence  of mitral valve regurgitation. No evidence of mitral stenosis.  4. The aortic valve is normal in structure. Aortic valve regurgitation is not visualized. No aortic stenosis is present.  5. There is Moderate (Grade III) protruding plaque involving the transverse aorta.  6. The inferior vena cava is normal in size with greater than 50% respiratory variability, suggesting right atrial pressure of 3 mmHg. FINDINGS  Left Ventricle: Left ventricular ejection fraction, by estimation, is 60 to 65%. The left ventricle has normal function. The left ventricle has no regional wall motion abnormalities. The left ventricular internal cavity size was normal in size. There is  no left ventricular hypertrophy. Left ventricular diastolic parameters are consistent with Grade I diastolic dysfunction (impaired relaxation). Elevated left atrial pressure. Right Ventricle: The right ventricular size is  normal. No increase in right ventricular wall thickness. Right ventricular systolic function is normal. There is normal pulmonary artery systolic pressure. The tricuspid regurgitant velocity is 2.55 m/s, and  with an assumed right atrial pressure of 3 mmHg, the estimated right ventricular systolic pressure is 86.5 mmHg. Left Atrium: Left atrial size was normal in size. Right Atrium: Right atrial size was normal in size. Prominent Eustachian valve. Pericardium: There is no evidence of pericardial effusion. Mitral Valve: The mitral valve is normal in structure. No evidence of mitral valve regurgitation. No evidence of mitral valve stenosis. Tricuspid Valve: The tricuspid valve is normal in structure. Tricuspid valve regurgitation is trivial. No evidence of tricuspid stenosis. Aortic Valve: The aortic valve is normal in structure. Aortic valve regurgitation is not visualized. No aortic stenosis is present. Pulmonic Valve: The pulmonic valve was normal in structure. Pulmonic valve regurgitation is not visualized. No evidence of pulmonic stenosis. Aorta: The aortic root is normal in size and structure. There is moderate (Grade III) protruding plaque involving the transverse aorta. Venous: The inferior vena cava is normal in size with greater than 50% respiratory variability, suggesting right atrial pressure of 3 mmHg. IAS/Shunts: No atrial level shunt detected by color flow Doppler.  LEFT VENTRICLE PLAX 2D LVIDd:         4.96 cm  Diastology LVIDs:         3.66 cm  LV e' medial:    7.29 cm/s LV PW:         1.16 cm  LV E/e' medial:  9.7 LV IVS:        1.17 cm  LV e' lateral:   8.38 cm/s LVOT diam:     2.50 cm  LV E/e' lateral: 8.4 LV SV:         101 LV SV Index:   51 LVOT Area:     4.91 cm  RIGHT VENTRICLE RV Basal diam:  3.10 cm RV S prime:     8.70 cm/s TAPSE (M-mode): 4.2 cm LEFT ATRIUM             Index       RIGHT ATRIUM           Index LA diam:        3.80 cm 1.92 cm/m  RA Area:     19.40 cm LA Vol (A2C):   49.9  ml 25.24 ml/m RA Volume:   53.30 ml  26.96 ml/m LA Vol (A4C):   50.8 ml 25.70 ml/m LA Biplane Vol: 53.0 ml 26.81 ml/m  AORTIC VALVE LVOT Vmax:   99.20 cm/s LVOT Vmean:  67.300 cm/s LVOT VTI:    0.206 m  AORTA Ao Root diam: 3.50 cm  MITRAL VALVE               TRICUSPID VALVE MV Area (PHT): 3.66 cm    TR Peak grad:   26.0 mmHg MV Decel Time: 207 msec    TR Vmax:        255.00 cm/s MV E velocity: 70.60 cm/s MV A velocity: 98.50 cm/s  SHUNTS MV E/A ratio:  0.72        Systemic VTI:  0.21 m                            Systemic Diam: 2.50 cm Mihai Croitoru MD Electronically signed by Sanda Klein MD Signature Date/Time: 10/13/2020/1:20:39 PM    Final   [2 weeks]   Assessment: 69 year old male with end-stage renal disease recently started dialysis, A. fib on Eliquis, admitted November 12 with 2-week history of diarrhea and CT scan (without contrast) findings of colitis with differential including infectious versus inflammatory.  GI pathogen and C. difficile testing both negative.  On empiric antibiotics with Cipro and Flagyl.  Developed rectal bleeding 11/15.  Large-volume. Some pain left abdomen.  Eliquis was stopped on the 16th due to rectal bleeding and hemoglobin of 6.9.  He received 3 units of packed red blood cells. His Hgb is stable in upper 7 range last 24 hours.   Colonoscopy October 17, 2020: Preparation of the colon was poor, exam incomplete.  Blood noted in the rectum, sigmoid colon, descending colon and at the splenic flexure.  Colitis involving descending and sigmoid colon status post biopsy.  Pathology revealed severely active chronic nonspecific colitis with ulceration, negative for granulomas or dysplasia.  Typical features of idiopathic inflammatory bowel disease not seen.  Differential includes ischemia, drug effect, infection.  CMV stain pending.CRP elevated at 8.4.  Sed rate normal.   Orthostatic hypotension with near syncopal episodes. Likely from intravascular volume depletion, blood  loss. Consider additional unit of prbcs. If persistent symptoms, will need additional work up.   Anemia: multifactorial in setting of ESRD, gi bleeding. He reports previous anemia issues and has received iron infusions this year. He will need completed colonoscopy at a later date.  Plan: Consider additional unit of prbcs during dialysis tomorrow.  Can advance to renal diet if he does okay with full liquids today.  Await CMV results.   He will need complete colonoscopy at a later date.  Laureen Ochs. Bernarda Caffey [name] [phone] 11/18/20213:41 PM    LOS: 5 days

## 2020-10-18 NOTE — Progress Notes (Signed)
Initial Nutrition Assessment  DOCUMENTATION CODES:   Not applicable  INTERVENTION:  D/c Nepro d/t pt dislike  Ensure Enlive po BID, each supplement provides 350 kcal and 20 grams of protein  Prosource Plus 30 ml po BID, each supplement provides 100 kcal and 15 grams of protein  Renavit po daily at bedtime  Diet advancement per GI  NUTRITION DIAGNOSIS:   Increased nutrient needs related to chronic illness, wound healing (ESRD on HD; stage II sacrum) as evidenced by estimated needs.    GOAL:   Patient will meet greater than or equal to 90% of their needs    MONITOR:   Labs, I & O's, Diet advancement, Supplement acceptance, PO intake, Weight trends, Skin  REASON FOR ASSESSMENT:   Consult Assessment of nutrition requirement/status  ASSESSMENT:  RD working remotely.  69 year old male with DM2, ESRD recently started on HD, atrial fibrillation presented from dialysis center after near syncope and orthostasis  reports feeling dizzy and lightheaded over the past couple of days and 2 weeks of diarrhea.  11/13-admit 11/17 HD - Net UF 0 ml  Spoke with pt via phone this afternoon. Reports feeling some better, tolerating full liquids. He reports 100% of chicken soup and Ensure supplement, has not eaten the pudding or consumed the tea, states he prefers unsweet tea. Will update Health [facility] with preferences. Pt eats well at baseline, states that he has always had good appetite, stays active, recalls working on building a house prior to admission and going dancing 3x/week. He recalls usually weighing around 170 lbs. Currently he weighs 80.7 kg (177.54) which is ~14 lb increase in the past 5 days. On 11/13 he weighed 74.5 kg (163.9 lbs) Per encounters, pt weighed 74.6 kg (164.12 lbs) on 10/25, he weighed 80.7 kg (177.54 lbs) on 10/14, he weighed 79.4 kg (174.68 lb) on 9/01 and 78.9 kg (173.58 lbs) on 7/16. Weights have fluctuated ~13 lbs in the last 4 months, suspect trends related to  fluid shifts secondary to ESRD, recently started on dialysis. RD educated on the importance of adequate calorie/protein intake and offered nutrition supplements to aid with meeting needs. He has tried Nepro supplement, reports it is too sweet and did not care for it. He does like chocolate Ensure and agreeable to drinking. Will   order Ensure and Prosource supplements BID as well as daily Renavit.   I/Os: +794 since admit UOP: 1695 ml x 24 hrs  Medications reviewed and include: Calcitriol, Cipro, Nepro, Flagyl  Labs: Na 132 (L), BUN 29 (H), Cr 2.95 (H), Hgb 7.7 (L), HCT 25.5 (L), RBC 2.78 (L), K 3.7 (WNL) 11/17 Mg 1.5 (L), P 3.2 (WNL)  Per notes: -acute blood loss anemia d/t rectal bleeding s/p 2 units PRBC -GI panel and C diff negative -colonoscopy limited d/t poor prep -biopsy with severely active chronic nonspecific colitis with ulceration -ESR normal -HD planned for 11/19  NUTRITION - FOCUSED PHYSICAL EXAM: Unable to complete at this time, RD working remotely.  Diet Order:   Diet Order            Diet full liquid Room service appropriate? Yes; Fluid consistency: Thin  Diet effective now                 EDUCATION NEEDS:   Education needs have been addressed  Skin:  Skin Assessment: Skin Integrity Issues: Skin Integrity Issues:: Stage II Stage II: sacrum  Last BM:  11/18-type 7  Height:   Ht Readings from Last 1  Encounters:  10/13/20 6' 2"  (1.88 m)    Weight:   Wt Readings from Last 1 Encounters:  10/18/20 80.7 kg    BMI:  Body mass index is 22.85 kg/m.  Estimated Nutritional Needs:   Kcal:  6629-4765  Protein:  115-130  Fluid:  UOP + 1000 ml   Lajuan Lines, RD, LDN Clinical Nutrition After Hours/Weekend Pager # in [phone]

## 2020-10-19 DIAGNOSIS — N186 End stage renal disease: Secondary | ICD-10-CM | POA: Diagnosis not present

## 2020-10-19 DIAGNOSIS — Z992 Dependence on renal dialysis: Secondary | ICD-10-CM

## 2020-10-19 DIAGNOSIS — E860 Dehydration: Secondary | ICD-10-CM | POA: Diagnosis not present

## 2020-10-19 DIAGNOSIS — K529 Noninfective gastroenteritis and colitis, unspecified: Secondary | ICD-10-CM | POA: Diagnosis not present

## 2020-10-19 DIAGNOSIS — D649 Anemia, unspecified: Secondary | ICD-10-CM | POA: Diagnosis not present

## 2020-10-19 LAB — BASIC METABOLIC PANEL
Anion gap: 8 (ref 5–15)
BUN: 33 mg/dL — ABNORMAL HIGH (ref 8–23)
CO2: 21 mmol/L — ABNORMAL LOW (ref 22–32)
Calcium: 7.3 mg/dL — ABNORMAL LOW (ref 8.9–10.3)
Chloride: 101 mmol/L (ref 98–111)
Creatinine, Ser: 3.87 mg/dL — ABNORMAL HIGH (ref 0.61–1.24)
GFR, Estimated: 16 mL/min — ABNORMAL LOW (ref >60)
Glucose, Bld: 118 mg/dL — ABNORMAL HIGH (ref 70–99)
Potassium: 3.5 mmol/L (ref 3.5–5.1)
Sodium: 130 mmol/L — ABNORMAL LOW (ref 135–145)

## 2020-10-19 LAB — CBC
HCT: 25.2 % — ABNORMAL LOW (ref 39.0–52.0)
Hemoglobin: 7.8 g/dL — ABNORMAL LOW (ref 13.0–17.0)
MCH: 28.7 pg (ref 26.0–34.0)
MCHC: 31 g/dL (ref 30.0–36.0)
MCV: 92.6 fL (ref 80.0–100.0)
Platelets: 416 10*3/uL — ABNORMAL HIGH (ref 150–400)
RBC: 2.72 MIL/uL — ABNORMAL LOW (ref 4.22–5.81)
RDW: 18.7 % — ABNORMAL HIGH (ref 11.5–15.5)
WBC: 6.7 10*3/uL (ref 4.0–10.5)
nRBC: 0 % (ref 0.0–0.2)

## 2020-10-19 LAB — SURGICAL PATHOLOGY

## 2020-10-19 MED ORDER — MIDODRINE HCL 5 MG PO TABS
5.0000 mg | ORAL_TABLET | ORAL | Status: AC
  Administered 2020-10-19: 5 mg via ORAL
  Filled 2020-10-19: qty 1

## 2020-10-19 MED ORDER — METHYLPREDNISOLONE SODIUM SUCC 125 MG IJ SOLR
60.0000 mg | INTRAMUSCULAR | Status: DC
  Administered 2020-10-19 – 2020-10-24 (×6): 60 mg via INTRAVENOUS
  Filled 2020-10-19 (×6): qty 2

## 2020-10-19 MED ORDER — MIDODRINE HCL 5 MG PO TABS
10.0000 mg | ORAL_TABLET | Freq: Three times a day (TID) | ORAL | Status: DC
  Administered 2020-10-19 – 2020-11-01 (×36): 10 mg via ORAL
  Filled 2020-10-19 (×38): qty 2

## 2020-10-19 NOTE — TOC Progression Note (Addendum)
Transition of Care [facility]) - Progression Note    Patient Details  Name: [name] MRN: [id number] Date of Birth: Dec 25, 1950  [facility] CM/SW Contact  Shade Flood, LCSW Phone Number: 10/19/2020, 11:22 AM  Clinical Narrative:     TOC following. Pt status discussed with MD in Progression today. Per MD, he is anticipating pt will be medically stable for dc Sunday. Updated Mardene Celeste at [facility] who states that they do not admit new patients on Sunday due to pharmacy availability. She states that they can accept pt Monday after his HD treatment.   Updated MD and HD RN with request for AM HD treatment for pt on Monday. Also, pt will need a new negative covid test result within 48 hours of dc. Updated MD.  Donella Stade will follow.  Expected Discharge Plan: [place] Barriers to Discharge: Continued Medical Work up  Expected Discharge Plan and Services Expected Discharge Plan: [place] arrangements for the past 2 months: Single Family Home                                       Social Determinants of Health (SDOH) Interventions    Readmission Risk Interventions No flowsheet data found.

## 2020-10-19 NOTE — Progress Notes (Signed)
PROGRESS NOTE    [name]  AYT:[id number] DOB: [birth year] DOA: 10/12/2020 PCP: Patient, No Pcp Per   Chief Complaint  Patient presents with  . Near Syncope   Brief Narrative: 57o yo male with history of diabetes mellitus, CKD stage V started recently on dialysis Monday Wednesday Friday and still making urine, recent A. fib and was restarted on amiodarone metoprolol and Eliquis was sent from [facility] center for new syncopal event and orthostasis.  As per report he was discharged last week from [place], he is on hemodialysis center since Monday, he does report syncope on Monday after dialysis, as well he does report orthostasis and near syncope for last couple days which he was started on midodrine by his nephrologist, report this morning prior to dialysis he felt dizzy and lightheaded where he was hypotensive for which they sent him to ED, patient report he is still making good amount of urine daily, reports his weight 2 weeks ago was 29.2 kg, and during most dialysis session was at 76,as well he does report diarrhea x2 weeks, he denies fever, chills or abdominal pain, he does report episodes of vomiting on Wednesday, but no recurrence, overall he reports good oral intake and fluid consumption.  Patient was noted to be orthostatic127/67 supine, 71/48 standing with no improvement despite receiving 500 cc x 2 bolus, Significant for potassium of 3.2, sodium of 131, nine 8.2, CT abdomen pelvis significant for wall thickening in the rectal area, with redundant dilated sigmoid colon, but no overt volvulus, so [facility] consulted to admit. Nephrology and GI was consulted. Had acute blood loss anemia needed 2 unit PRBC. And by GI underwent colonoscopy, poor prep were able to obtain biopsy and patient was monitored closely  Subjective: Patient reports he is feeling somewhat better stool is more formed now hb stayed stable, denies shortness of breath, has some leg edema and going for dialysis  today.   Assessment & Plan:  Near syncopal events with orthostatic hypotension, on midodrine, recurrent syncope with orthostatic hypotension: from intravascular volume depletion blood loss anemia.S/P 2 unit PRBC and IV fluids. Echo w/ preserved LV function and grade 1 diastolic dysfunction.  IV fluids, continue midodrine 5 g 3 times daily, monitor orthostatic vitals_still positive (laying 125/72, sitting 92/66, and then standing 56/43),  will increase to 10 mg and enve of only 0.5l UF removal in HD per nephro. Continue PT OT.   Colitis/rectal bleeding with acute blood loss anemia with recent diarrhea:Empirically placed on Cipro and Flagyl.He has had chronic diarrhea and recent work-up w/ GI panel and C. difficile are negative from 11/14 and 11/15. Underwent colonoscopy 11/17:colonoscopy is limited to splenic flexure because of poor prep rectal mucosa not well seen because it was covered with blood in the stool noted to have abdominal mucosa in sigmoid and descending colon with friability scattered ulcers and loss of vascularity biopsies were taken-biopsy came back severely active chronic nonspecific colitis with ulceration, negative for granulomas or dysplasia typical features of idiopathic inflammatory bowel disease not seen.  Awaiting CMV results. Advance diet as tolerated and will need complete colonoscopy at a later date.  Overall seems to be improving.  Acute blood loss anemia due to rectal bleeding:s/p 2 units PRBC, HB has been staying stable 7.8 g range past 2 days. monitor. Recent Labs  Lab 10/16/20 2157 10/17/20 0645 10/17/20 1608 10/18/20 0445 10/19/20 0548  HGB 8.3* 8.1* 7.8* 7.7* 7.8*  HCT 26.8* 26.9* 26.0* 25.5* 25.2*   ESRD started recently on  dialysis MWF, for dialysis today.  Discussed with nephrology.  Stopping IV fluids.    Recent paroxysmal A. fib and was started on amiodarone metoprolol and Eliquis: Holding anticoagulations due to his rectal bleeding and acute blood loss  anemia will need GI clearance to resume anticoagulation as outpatient.  Patient understands risks and benefits of holding anticoagulation.  Hyponatremia in the setting of orthostasis volume depletion. Sodium is low.  Adjust with dialysis.    Hyperlipidemia:cont statins.  Low albumin:Dietitian consulted.  Hypocalcemia:Due to low albumin.monitor.  Nutrition: Diet Order            Diet full liquid Room service appropriate? Yes; Fluid consistency: Thin  Diet effective now                  Body mass index is 22.85 kg/m.  Pressure Ulcer: Pressure Injury 10/13/20 Sacrum Circumferential Stage 2 -  Partial thickness loss of dermis presenting as a shallow open injury with a red, pink wound bed without slough. (Active)  10/13/20 1330  Location: Sacrum  Location Orientation: Circumferential  Staging: Stage 2 -  Partial thickness loss of dermis presenting as a shallow open injury with a red, pink wound bed without slough.  Wound Description (Comments):   Present on Admission: Yes    DVT prophylaxis: Place and maintain sequential compression device Start: 10/15/20 1209 Code Status:   Code Status: Full Code  Family Communication: plan of care discussed with patient at bedside.  Status is: Inpatient Remains inpatient appropriate because:IV treatments appropriate due to intensity of illness or inability to take PO and Inpatient level of care appropriate due to severity of illness  Dispo:  Patient From: Home  Planned Disposition: [place]  Expected discharge date:  Over the weekend, will need negative covid w/i 48 prior to SNF d/c  Medically stable for discharge: No  Consultants:see note  Procedures:see note  Culture/Microbiology No results found for: SDES, SPECREQUEST, CULT, REPTSTATUS  Other culture-see note  Medications: Scheduled Meds: . (feeding supplement) PROSource Plus  30 mL Oral BID BM  . sodium chloride   Intravenous Once  . amiodarone  200 mg Oral  Daily  . calcitRIOL  0.5 mcg Oral Daily  . Chlorhexidine Gluconate Cloth  6 each Topical Q0600  . ciprofloxacin  500 mg Oral Daily  . feeding supplement  237 mL Oral BID BM  . metroNIDAZOLE  500 mg Oral Q8H  . midodrine  5 mg Oral TID  . multivitamin  1 tablet Oral QHS  . simvastatin  20 mg Oral QPM  . sodium chloride flush  3 mL Intravenous Q12H   Continuous Infusions: . sodium chloride    . sodium chloride    . sodium chloride 50 mL/hr at 10/18/20 2041    Antimicrobials: Anti-infectives (From admission, onward)   Start     Dose/Rate Route Frequency Ordered Stop   10/13/20 1400  metroNIDAZOLE (FLAGYL) tablet 500 mg        500 mg Oral Every 8 hours 10/13/20 1215     10/13/20 1230  ciprofloxacin (CIPRO) tablet 500 mg        500 mg Oral Daily 10/13/20 1215       Objective: Vitals: Today's Vitals   10/18/20 1426 10/18/20 2000 10/18/20 2101 10/19/20 0539  BP: 119/69  131/73 127/65  Pulse: 73  90 72  Resp: 18  20 20   Temp: 98.7 F (37.1 C)  98.7 F (37.1 C) 98.2 F (36.8 C)  TempSrc:  SpO2: 98%  96% 98%  Weight:      Height:      PainSc:  0-No pain      Intake/Output Summary (Last 24 hours) at 10/19/2020 1127 Last data filed at 10/19/2020 0900 Gross per 24 hour  Intake 607.48 ml  Output 1126 ml  Net -518.52 ml   Filed Weights   10/14/20 0204 10/17/20 0432 10/18/20 0500  Weight: 74.1 kg 81.3 kg 80.7 kg   Weight change:   Intake/Output from previous day: 11/18 0701 - 11/19 0700 In: 457.5 [I.V.:457.5] Out: 1351 [Urine:1350; Stool:1] Intake/Output this shift: Total I/O In: 150 [P.O.:150] Out: 200 [Urine:200]  Examination: General exam: AAO x3, NAD, weak appearing. HEENT:Oral mucosa moist, Ear/Nose WNL grossly, dentition normal. TDC + chest. Respiratory system: bilaterally clear,no wheezing or crackles,no use of accessory muscle Cardiovascular system: S1 & S2 +, No JVD,. Gastrointestinal system: Abdomen soft, NT,ND, BS+ Nervous System:Alert, awake,  moving extremities and grossly nonfocal Extremities: mild leg edema, distal peripheral pulses palpable.  Skin: No rashes,no icterus. MSK: Normal muscle bulk,tone, power  Data Reviewed: I have personally reviewed following labs and imaging studies CBC: Recent Labs  Lab 10/16/20 0758 10/16/20 0758 10/16/20 2157 10/17/20 0645 10/17/20 1608 10/18/20 0445 10/19/20 0548  WBC 6.7  --   --  5.8 5.5 5.3 6.7  HGB 6.7*   < > 8.3* 8.1* 7.8* 7.7* 7.8*  HCT 22.4*   < > 26.8* 26.9* 26.0* 25.5* 25.2*  MCV 93.3  --   --  92.4 92.5 91.7 92.6  PLT 417*  --   --  403* 410* 384 416*   < > = values in this interval not displayed.   Basic Metabolic Panel: Recent Labs  Lab 10/13/20 0434 10/13/20 1452 10/14/20 0550 10/14/20 0550 10/15/20 0627 10/16/20 0758 10/17/20 0645 10/18/20 0445 10/19/20 0548  NA   < >  --  132*   < > 130* 130* 130* 132* 130*  K   < >  --  3.6   < > 3.5 3.8 3.7 3.7 3.5  CL   < >  --  99   < > 102 99 102 101 101  CO2   < >  --  23   < > 20* 22 20* 22 21*  GLUCOSE   < >  --  88   < > 99 97 97 86 118*  BUN   < >  --  25*   < > 43* 34* 43* 29* 33*  CREATININE   < >  --  3.18*   < > 4.51* 3.58* 4.21* 2.95* 3.87*  CALCIUM   < >  --  7.0*   < > 6.9* 6.9* 7.1* 7.2* 7.3*  MG  --   --   --   --   --   --  1.5*  --   --   PHOS  --  4.3 3.0  --  3.3 2.5 3.2  --   --    < > = values in this interval not displayed.   GFR: Estimated Creatinine Clearance: 20.3 mL/min (A) (by C-G formula based on SCr of 3.87 mg/dL (H)). Liver Function Tests: Recent Labs  Lab 10/12/20 1348 10/14/20 0550 10/15/20 0627 10/16/20 0758 10/17/20 0645  AST 14*  --   --   --   --   ALT 15  --   --   --   --   ALKPHOS 74  --   --   --   --  BILITOT 0.6  --   --   --   --   PROT 5.1*  --   --   --   --   ALBUMIN 1.9* 2.0*  2.0* 1.8* 1.7* 1.7*   Recent Labs  Lab 10/12/20 1348  LIPASE 20   No results for input(s): AMMONIA in the last 168 hours. Coagulation Profile: No results for input(s):  INR, PROTIME in the last 168 hours. Cardiac Enzymes: No results for input(s): CKTOTAL, CKMB, CKMBINDEX, TROPONINI in the last 168 hours. BNP (last 3 results) No results for input(s): PROBNP in the last 8760 hours. HbA1C: No results for input(s): HGBA1C in the last 72 hours. CBG: Recent Labs  Lab 10/12/20 1344 10/13/20 0635 10/14/20 0535 10/14/20 0737 10/15/20 0536  GLUCAP 139* 88 88 88 98   Lipid Profile: No results for input(s): CHOL, HDL, LDLCALC, TRIG, CHOLHDL, LDLDIRECT in the last 72 hours. Thyroid Function Tests: No results for input(s): TSH, T4TOTAL, FREET4, T3FREE, THYROIDAB in the last 72 hours. Anemia Panel: No results for input(s): VITAMINB12, FOLATE, FERRITIN, TIBC, IRON, RETICCTPCT in the last 72 hours. Sepsis Labs: No results for input(s): PROCALCITON, LATICACIDVEN in the last 168 hours.  Recent Results (from the past 240 hour(s))  Respiratory Panel by RT PCR (Flu A&B, Covid) - Nasopharyngeal Swab     Status: None   Collection Time: 10/12/20  3:13 PM   Specimen: Nasopharyngeal Swab  Result Value Ref Range Status   SARS Coronavirus 2 by RT PCR NEGATIVE NEGATIVE Final    Comment: (NOTE) SARS-CoV-2 target nucleic acids are NOT DETECTED.  The SARS-CoV-2 RNA is generally detectable in upper respiratoy specimens during the acute phase of infection. The lowest concentration of SARS-CoV-2 viral copies this assay can detect is 131 copies/mL. A negative result does not preclude SARS-Cov-2 infection and should not be used as the sole basis for treatment or other patient management decisions. A negative result may occur with  improper specimen collection/handling, submission of specimen other than nasopharyngeal swab, presence of viral mutation(s) within the areas targeted by this assay, and inadequate number of viral copies (<131 copies/mL). A negative result must be combined with clinical observations, patient history, and epidemiological information. The expected  result is Negative.  Fact Sheet for Patients:  PinkCheek.be  Fact Sheet for Healthcare Providers:  GravelBags.it  This test is no t yet approved or cleared by the [place] FDA and  has been authorized for detection and/or diagnosis of SARS-CoV-2 by FDA under an Emergency Use Authorization (EUA). This EUA will remain  in effect (meaning this test can be used) for the duration of the COVID-19 declaration under Section 564(b)(1) of the Act, 21 U.S.C. section 360bbb-3(b)(1), unless the authorization is terminated or revoked sooner.     Influenza A by PCR NEGATIVE NEGATIVE Final   Influenza B by PCR NEGATIVE NEGATIVE Final    Comment: (NOTE) The Xpert Xpress SARS-CoV-2/FLU/RSV assay is intended as an aid in  the diagnosis of influenza from Nasopharyngeal swab specimens and  should not be used as a sole basis for treatment. Nasal washings and  aspirates are unacceptable for Xpert Xpress SARS-CoV-2/FLU/RSV  testing.  Fact Sheet for Patients: PinkCheek.be  Fact Sheet for Healthcare Providers: GravelBags.it  This test is not yet approved or cleared by the [place] FDA and  has been authorized for detection and/or diagnosis of SARS-CoV-2 by  FDA under an Emergency Use Authorization (EUA). This EUA will remain  in effect (meaning this test can be used) for  the duration of the  Covid-19 declaration under Section 564(b)(1) of the Act, 21  U.S.C. section 360bbb-3(b)(1), unless the authorization is  terminated or revoked. Performed at [facility], [address] [place], [place] [address]   MRSA PCR Screening     Status: None   Collection Time: 10/13/20  2:35 PM   Specimen: Nasopharyngeal  Result Value Ref Range Status   MRSA by PCR NEGATIVE NEGATIVE Final    Comment:        The GeneXpert MRSA Assay (FDA approved for NASAL specimens only), is one component of  a comprehensive MRSA colonization surveillance program. It is not intended to diagnose MRSA infection nor to guide or monitor treatment for MRSA infections. Performed at [facility], [address], [place], [place] [address]   C Difficile Quick Screen w PCR reflex     Status: None   Collection Time: 10/14/20  2:15 PM   Specimen: Stool  Result Value Ref Range Status   C Diff antigen NEGATIVE NEGATIVE Final   C Diff toxin NEGATIVE NEGATIVE Final   C Diff interpretation No C. difficile detected.  Final    Comment: Performed at [facility], [address] [place], [place] [address]  Gastrointestinal Panel by PCR , Stool     Status: None   Collection Time: 10/15/20 11:02 AM   Specimen: Stool  Result Value Ref Range Status   Campylobacter species NOT DETECTED NOT DETECTED Final   Plesimonas shigelloides NOT DETECTED NOT DETECTED Final   Salmonella species NOT DETECTED NOT DETECTED Final   Yersinia enterocolitica NOT DETECTED NOT DETECTED Final   Vibrio species NOT DETECTED NOT DETECTED Final   Vibrio cholerae NOT DETECTED NOT DETECTED Final   Enteroaggregative E coli (EAEC) NOT DETECTED NOT DETECTED Final   Enteropathogenic E coli (EPEC) NOT DETECTED NOT DETECTED Final   Enterotoxigenic E coli (ETEC) NOT DETECTED NOT DETECTED Final   Shiga like toxin producing E coli (STEC) NOT DETECTED NOT DETECTED Final   Shigella/Enteroinvasive E coli (EIEC) NOT DETECTED NOT DETECTED Final   Cryptosporidium NOT DETECTED NOT DETECTED Final   Cyclospora cayetanensis NOT DETECTED NOT DETECTED Final   Entamoeba histolytica NOT DETECTED NOT DETECTED Final   Giardia lamblia NOT DETECTED NOT DETECTED Final   Adenovirus F40/41 NOT DETECTED NOT DETECTED Final   Astrovirus NOT DETECTED NOT DETECTED Final   Norovirus GI/GII NOT DETECTED NOT DETECTED Final   Rotavirus A NOT DETECTED NOT DETECTED Final   Sapovirus (I, II, IV, and V) NOT DETECTED NOT DETECTED Final    Comment: Performed at [facility], [address] [place], [place] [address]    Radiology Studies: No results found.   LOS: 6 days   Antonieta Pert, MD [facility]  10/19/2020, 11:27 AM

## 2020-10-19 NOTE — Progress Notes (Signed)
Notified Dr. Maren Beach about patient's vitals. PT did orthostatic vitals on patient. Lying BP 125/72, sitting 92/66, standing 85/60, and standing BP for 3 minutes was 56/43

## 2020-10-19 NOTE — Progress Notes (Signed)
Patient ID: MALIQUE DRISKILL, male   DOB: 03/18/1951, 69 y.o.   MRN: [id number]    S: patient reports that he thinks the GI bleeding is getting better. Stools are more formed. Ankle edema worsening, still on fluids. No other complaints/acute events. Has not tried getting up since cscope. uop unchanged (still able to fill a urinal throughout the day)  O:BP 127/65 (BP Location: Left Arm)   Pulse 72   Temp 98.2 F (36.8 C)   Resp 20   Ht 6\' 2"  (1.88 m)   Wt 80.7 kg   SpO2 98%   BMI 22.85 kg/m   Intake/Output Summary (Last 24 hours) at 10/19/2020 0803 Last data filed at 10/19/2020 0700 Gross per 24 hour  Intake 457.48 ml  Output 1051 ml  Net -593.52 ml   Intake/Output: I/O last 3 completed shifts: In: 457.5 [I.V.:457.5] Out: 4098 [Urine:1850; Stool:1]  Intake/Output this shift:  No intake/output data recorded. Weight change:    Gen: NAD CVS: RRR, no rub Resp: cta bl Abd: +BS, soft, Nt/ND Ext: 1+ edema of bilateral ankles Neuro: speech clear and coherent, moves all ext spontaneously Access: [facility] c/d/i  Recent Labs  Lab 10/12/20 1344 10/12/20 1348 10/13/20 0434 10/13/20 1452 10/14/20 0550 10/15/20 0627 10/16/20 0758 10/17/20 0645 10/18/20 0445 10/19/20 0548  NA   < >  --  131*  --  132* 130* 130* 130* 132* 130*  K   < >  --  3.3*  --  3.6 3.5 3.8 3.7 3.7 3.5  CL   < >  --  99  --  99 102 99 102 101 101  CO2   < >  --  22  --  23 20* 22 20* 22 21*  GLUCOSE   < >  --  95  --  88 99 97 97 86 118*  BUN   < >  --  45*  --  25* 43* 34* 43* 29* 33*  CREATININE   < >  --  5.33*  --  3.18* 4.51* 3.58* 4.21* 2.95* 3.87*  ALBUMIN  --  1.9*  --   --  2.0*  2.0* 1.8* 1.7* 1.7*  --   --   CALCIUM   < >  --  7.2*  --  7.0* 6.9* 6.9* 7.1* 7.2* 7.3*  PHOS  --   --   --  4.3 3.0 3.3 2.5 3.2  --   --   AST  --  14*  --   --   --   --   --   --   --   --   ALT  --  15  --   --   --   --   --   --   --   --    < > = values in this interval not displayed.   Liver Function  Tests: Recent Labs  Lab 10/12/20 1348 10/14/20 0550 10/15/20 0627 10/16/20 0758 10/17/20 0645  AST 14*  --   --   --   --   ALT 15  --   --   --   --   ALKPHOS 74  --   --   --   --   BILITOT 0.6  --   --   --   --   PROT 5.1*  --   --   --   --   ALBUMIN 1.9*   < > 1.8* 1.7* 1.7*   < > =  values in this interval not displayed.   Recent Labs  Lab 10/12/20 1348  LIPASE 20   No results for input(s): AMMONIA in the last 168 hours. CBC: Recent Labs  Lab 10/16/20 0758 10/16/20 2157 10/17/20 0645 10/17/20 0645 10/17/20 1608 10/18/20 0445 10/19/20 0548  WBC 6.7  --  5.8   < > 5.5 5.3 6.7  HGB 6.7*   < > 8.1*   < > 7.8* 7.7* 7.8*  HCT 22.4*   < > 26.9*   < > 26.0* 25.5* 25.2*  MCV 93.3  --  92.4  --  92.5 91.7 92.6  PLT 417*  --  403*   < > 410* 384 416*   < > = values in this interval not displayed.   Cardiac Enzymes: No results for input(s): CKTOTAL, CKMB, CKMBINDEX, TROPONINI in the last 168 hours. CBG: Recent Labs  Lab 10/12/20 1344 10/13/20 0635 10/14/20 0535 10/14/20 0737 10/15/20 0536  GLUCAP 139* 88 88 88 98    Iron Studies:  No results for input(s): IRON, TIBC, TRANSFERRIN, FERRITIN in the last 72 hours. Studies/Results: No results found. . (feeding supplement) PROSource Plus  30 mL Oral BID BM  . sodium chloride   Intravenous Once  . amiodarone  200 mg Oral Daily  . calcitRIOL  0.5 mcg Oral Daily  . Chlorhexidine Gluconate Cloth  6 each Topical Q0600  . ciprofloxacin  500 mg Oral Daily  . feeding supplement  237 mL Oral BID BM  . metroNIDAZOLE  500 mg Oral Q8H  . midodrine  5 mg Oral TID  . multivitamin  1 tablet Oral QHS  . simvastatin  20 mg Oral QPM  . sodium chloride flush  3 mL Intravenous Q12H    BMET    Component Value Date/Time   NA 130 (L) 10/19/2020 0548   K 3.5 10/19/2020 0548   CL 101 10/19/2020 0548   CO2 21 (L) 10/19/2020 0548   GLUCOSE 118 (H) 10/19/2020 0548   BUN 33 (H) 10/19/2020 0548   CREATININE 3.87 (H) 10/19/2020  0548   CALCIUM 7.3 (L) 10/19/2020 0548   GFRNONAA 16 (L) 10/19/2020 0548   GFRAA 19 (L) 06/15/2018 0407   CBC    Component Value Date/Time   WBC 6.7 10/19/2020 0548   RBC 2.72 (L) 10/19/2020 0548   HGB 7.8 (L) 10/19/2020 0548   HCT 25.2 (L) 10/19/2020 0548   PLT 416 (H) 10/19/2020 0548   MCV 92.6 10/19/2020 0548   MCH 28.7 10/19/2020 0548   MCHC 31.0 10/19/2020 0548   RDW 18.7 (H) 10/19/2020 0548   LYMPHSABS 0.6 (L) 10/05/2020 1850   MONOABS 1.2 (H) 10/05/2020 1850   EOSABS 0.0 10/05/2020 1850   BASOSABS 0.0 10/05/2020 1850     Assessment/Plan:  1. Syncope/hypotension- presumably volume depletion due to diarrhea and UF with HD, but despite IVF's and no UF with HD, he had another episode of symptomatic orthostasis. 1. ECHO with preserved LV function and grade I diastolic dysfunction.  No LVH. 2. On midodrine, if still orthostatic can increase to 10mg  tid 3. Stop fluids, discussed with primary 2. Colitis- GI following and on cipro/flagyl.  s/p cscope 11/17: poor prep with incomplete exam, blood in the rectum and sigmoid colon, in the descending colon and at the splenic flexure; colitis involving descending and sigmoid colons seen.  Biopsies performed. CMV pending 3. ESRD new start on MWF at [facility].  HD today, will try to UF today (1L) 4. Anemia: of CKD-  s/p blood transfusion.  Follow h/H, no heparin with HD.  GI on board, cscope findings as above. 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  Resume calcitriol 0.77mcg daily (was on this as an outpatient). Corrected calcium ~8.9 11/17 6. Nutrition: per primary, push protein 7. H/o Hypertension: see #1 8. Vascular access- will need to f/u with Dr. Donnetta Hutching once stable for discharge. 9. A fib- on amiodarone and eliquis (off A/C as of right now) 10. Hyponatremia. 137Na bath with HD, will try to remove some fluid as well today to see if this helps  Will not be physically seen over the weekend from a nephrology perspective,  please call with any questions/concerns.  Gean Quint, MD [facility]

## 2020-10-19 NOTE — Plan of Care (Signed)
  Problem: Education: Goal: Knowledge of General Education information will improve Description Including pain rating scale, medication(s)/side effects and non-pharmacologic comfort measures Outcome: Progressing   Problem: Health Behavior/Discharge Planning: Goal: Ability to manage health-related needs will improve Outcome: Progressing   

## 2020-10-19 NOTE — Progress Notes (Signed)
Subjective: Feeling a bit better today. Has had a bowel movement this morning, seems to be trying to become more solidified. No diarrhea. Per nursing did have some more blood this morning (seems to be "oozing" rather than large volume bleeding). Some mild abdominal discomfort but no significant pain. Denies N/V. Is tolerating diet well but decreased appetite. No other GI complaints.  Objective: Vital signs in last 24 hours: Temp:  [98.2 F (36.8 C)-98.7 F (37.1 C)] 98.2 F (36.8 C) (11/19 0539) Pulse Rate:  [72-90] 72 (11/19 0539) Resp:  [18-20] 20 (11/19 0539) BP: (119-131)/(65-73) 127/65 (11/19 0539) SpO2:  [96 %-98 %] 98 % (11/19 0539) Last BM Date: 10/18/20 General:   Alert and oriented, pleasant Head:  Normocephalic and atraumatic. Eyes:  No icterus, sclera clear. Conjuctiva pink.  Heart:  S1, S2 present, no murmurs noted.  Lungs: Clear to auscultation bilaterally, without wheezing, rales, or rhonchi.  Abdomen:  Bowel sounds present, soft, non-tender, non-distended. No HSM or hernias noted. No rebound or guarding. Msk:  Symmetrical without gross deformities. Pulses:  Normal bilateral DP pulses noted. Extremities:  Without clubbing or edema. Neurologic:  Alert and  oriented x4;  grossly normal neurologically. Psych:  Alert and cooperative. Normal mood and affect.  Intake/Output from previous day: 11/18 0701 - 11/19 0700 In: 457.5 [I.V.:457.5] Out: 1351 [Urine:1350; Stool:1] Intake/Output this shift: No intake/output data recorded.  Lab Results: Recent Labs    10/17/20 1608 10/18/20 0445 10/19/20 0548  WBC 5.5 5.3 6.7  HGB 7.8* 7.7* 7.8*  HCT 26.0* 25.5* 25.2*  PLT 410* 384 416*   BMET Recent Labs    10/17/20 0645 10/18/20 0445 10/19/20 0548  NA 130* 132* 130*  K 3.7 3.7 3.5  CL 102 101 101  CO2 20* 22 21*  GLUCOSE 97 86 118*  BUN 43* 29* 33*  CREATININE 4.21* 2.95* 3.87*  CALCIUM 7.1* 7.2* 7.3*   LFT Recent Labs    10/17/20 0645  ALBUMIN 1.7*    PT/INR No results for input(s): LABPROT, INR in the last 72 hours. Hepatitis Panel No results for input(s): HEPBSAG, HCVAB, HEPAIGM, HEPBIGM in the last 72 hours.   Studies/Results: No results found.  Assessment: 69 year old male with end-stage renal disease recently started dialysis, A. fib on Eliquis, admitted November 12 with 2-week history of diarrhea and CT scan (without contrast) findings of colitis with differential including infectious versus inflammatory.  GI pathogen and C. difficile testing both negative.  On empiric antibiotics with Cipro and Flagyl.  Developed rectal bleeding 11/15.  Large-volume. Some pain left abdomen.  Eliquis was stopped on the 16th due to rectal bleeding and hemoglobin of 6.9.  He received 3 units of packed red blood cells. Some mild to moderate bleeding today.  Colonoscopy 10/17/20: Preparation of the colon was poor, exam incomplete.  Blood noted in the rectum, sigmoid colon, descending colon and at the splenic flexure.  Colitis involving descending and sigmoid colon status post biopsy.  Pathology revealed severely active chronic nonspecific colitis with ulceration, negative for granulomas or dysplasia.  Typical features of idiopathic inflammatory bowel disease not seen.  Differentials includes ischemia, drug effect, infection. CMV stain pending. CRP elevated at 8.4 but sed rate normal.   Orthostatic hypotension with near syncopal episodes. Likely from intravascular volume depletion, blood loss. Consider additional unit of prbcs. If persistent symptoms, will need additional work up.   Anemia: multifactorial in setting of ESRD, gi bleeding. He reports previous anemia issues and has received iron infusions this  year. He will need completed colonoscopy at a later date. His hgb has remained stable in the past 48 hours (7.8 today).   Plan: 1. Follow for CMV pathology addendum 2. Monitor for recurrent bleed 3. Follow hgb daily 4. Transfuse as  necessary 5. Plan outpatient complete colonoscopy 6. Supportive measures   Thank you for allowing [place] to participate in the care of Isabella Bowens, DNP, AGNP-C Adult & Gerontological Nurse Practitioner [facility]     LOS: 6 days    10/19/2020, 9:41 AM

## 2020-10-19 NOTE — Procedures (Signed)
   HEMODIALYSIS TREATMENT NOTE:  Symptomatic orthostasis earlier today during physical therapy.  Goal decreased to 1/2 L per Dr. Keturah Barre verbal order.  Soft pressures, but able to tolerate removal of 500cc.  Post sitting/standing pressures were not obtained.  All blood was returned.  No changes from pre-dialysis assessment.  Rockwell Alexandria, RN

## 2020-10-19 NOTE — Progress Notes (Signed)
Physical Therapy Treatment Patient Details Name: [name] MRN: [id number] DOB: [birth year] Today's Date: 10/19/2020    History of Present Illness [name]  is a 69 y.o. male, with past medical history of diabetes mellitus, CKD stage V, recent hospitalization at [facility] for which he started hemodialysis for ESRD on MWF schedule, and diagnosed with A. fib when he started on amiodarone, metoprolol and Eliquis, patient was sent from hemodialysis center for near syncope and orthostasis, patient reports he was discharged last week from [place], he is on hemodialysis center since Monday, he does report syncope on Monday after dialysis, as well he does report orthostasis and near syncope for last couple days which he was started on midodrine by his nephrologist, report this morning prior to dialysis he felt dizzy and lightheaded where he was hypotensive for which they sent him to ED, patient report he is still making good amount of urine daily, reports his weight 2 weeks ago was 29.2 kg, and during most dialysis session was at 76, as well he does report diarrhea x2 weeks, he denies fever, chills or abdominal pain, he does report episodes of vomiting on Wednesday, but no recurrence, overall he reports good oral intake and fluid consumption.    PT Comments    Patient demonstrates slightly labored movement for sitting up at bedside, tolerated sitting up at bedside for approximately 15 minutes while completing BLE ROM/strengthening exercises, limited to standing/taking a few side steps at bedside due to weakness and decreasing BP.  Patient orthostatic BP's as follows: lying 125/72, sitting initially 91/60, after completing exercises while seated 85/60, standing 56/43 - patient's RN, dialysis nurse notified.  Patient put back to bed after therapy.  Patient will benefit from continued physical therapy in hospital and recommended venue below to increase strength, balance, endurance for safe ADLs and  gait.    Follow Up Recommendations  SNF     Equipment Recommendations  None recommended by PT    Recommendations for Other Services       Precautions / Restrictions Precautions Precautions: Fall Restrictions Weight Bearing Restrictions: No    Mobility  Bed Mobility Overal bed mobility: Needs Assistance Bed Mobility: Supine to Sit;Sit to Supine     Supine to sit: Min guard Sit to supine: Min guard   General bed mobility comments: slightly labored movement  Transfers Overall transfer level: Needs assistance Equipment used: Rolling walker (2 wheeled) Transfers: Sit to/from Stand Sit to Stand: Mod assist         General transfer comment: unsteady on feet due to BLE weakness, c/o lighheadedness  Ambulation/Gait Ambulation/Gait assistance: Mod assist Gait Distance (Feet): 4 Feet Assistive device: Rolling walker (2 wheeled) Gait Pattern/deviations: Decreased step length - right;Decreased step length - left;Decreased stride length Gait velocity: slow   General Gait Details: limited to 3-4 slow unsteady labored side steps at bedside due to weakness, poor standing balance   Stairs             Wheelchair Mobility    Modified Rankin (Stroke Patients Only)       Balance Overall balance assessment: Needs assistance Sitting-balance support: Feet supported;No upper extremity supported Sitting balance-Leahy Scale: Fair Sitting balance - Comments: fair/good seated at bedside   Standing balance support: During functional activity;Bilateral upper extremity supported Standing balance-Leahy Scale: Poor Standing balance comment: fair/poor using RW  Cognition Arousal/Alertness: Awake/alert Behavior During Therapy: WFL for tasks assessed/performed Overall Cognitive Status: Within Functional Limits for tasks assessed                                        Exercises General Exercises - Lower Extremity Long  Arc Quad: Seated;AROM;Strengthening;Both;15 reps Hip Flexion/Marching: Seated;AROM;Strengthening;Both;15 reps Toe Raises: Seated;AROM;Strengthening;Both;20 reps Heel Raises: Seated;AROM;Strengthening;Both;20 reps    General Comments        Pertinent Vitals/Pain Pain Assessment: 0-10 Pain Score: 2  Pain Location: stomach feels bloated Pain Descriptors / Indicators: Discomfort Pain Intervention(s): Limited activity within patient's tolerance;Monitored during session    Home Living                      Prior Function            PT Goals (current goals can now be found in the care plan section) Acute Rehab PT Goals Patient Stated Goal: return home after rehab PT Goal Formulation: With patient Time For Goal Achievement: 10/29/20 Potential to Achieve Goals: Good Progress towards PT goals: Progressing toward goals    Frequency    Min 3X/week      PT Plan Current plan remains appropriate    Co-evaluation              AM-PAC PT "6 Clicks" Mobility   Outcome Measure  Help needed turning from your back to your side while in a flat bed without using bedrails?: None Help needed moving from lying on your back to sitting on the side of a flat bed without using bedrails?: A Little Help needed moving to and from a bed to a chair (including a wheelchair)?: A Lot Help needed standing up from a chair using your arms (e.g., wheelchair or bedside chair)?: A Lot Help needed to walk in hospital room?: A Lot Help needed climbing 3-5 steps with a railing? : A Lot 6 Click Score: 15    End of Session   Activity Tolerance: Patient tolerated treatment well;Patient limited by fatigue Patient left: in bed;with call bell/phone within reach Nurse Communication: Mobility status PT Visit Diagnosis: Unsteadiness on feet (R26.81);Other abnormalities of gait and mobility (R26.89);Muscle weakness (generalized) (M62.81)     Time: 7096-2836 PT Time Calculation (min) (ACUTE ONLY):  25 min  Charges:  $Therapeutic Exercise: 8-22 mins $Therapeutic Activity: 8-22 mins                     2:08 PM, 10/19/20 [name], MPT Physical Therapist with [facility] [phone] office [phone] mobile phone

## 2020-10-19 NOTE — Care Management Important Message (Signed)
Important Message  Patient Details  Name: [name] MRN: [id number] Date of Birth: [birth year]   Medicare Important Message Given:  Yes     [name] 10/19/2020, 4:04 PM

## 2020-10-20 DIAGNOSIS — K625 Hemorrhage of anus and rectum: Secondary | ICD-10-CM | POA: Diagnosis not present

## 2020-10-20 DIAGNOSIS — D649 Anemia, unspecified: Secondary | ICD-10-CM | POA: Diagnosis not present

## 2020-10-20 DIAGNOSIS — R197 Diarrhea, unspecified: Secondary | ICD-10-CM | POA: Diagnosis not present

## 2020-10-20 DIAGNOSIS — N186 End stage renal disease: Secondary | ICD-10-CM | POA: Diagnosis not present

## 2020-10-20 LAB — CBC
HCT: 27.3 % — ABNORMAL LOW (ref 39.0–52.0)
Hemoglobin: 8.3 g/dL — ABNORMAL LOW (ref 13.0–17.0)
MCH: 28 pg (ref 26.0–34.0)
MCHC: 30.4 g/dL (ref 30.0–36.0)
MCV: 92.2 fL (ref 80.0–100.0)
Platelets: 366 10*3/uL (ref 150–400)
RBC: 2.96 MIL/uL — ABNORMAL LOW (ref 4.22–5.81)
RDW: 18.5 % — ABNORMAL HIGH (ref 11.5–15.5)
WBC: 4.2 10*3/uL (ref 4.0–10.5)
nRBC: 0 % (ref 0.0–0.2)

## 2020-10-20 LAB — BASIC METABOLIC PANEL
Anion gap: 8 (ref 5–15)
BUN: 25 mg/dL — ABNORMAL HIGH (ref 8–23)
CO2: 23 mmol/L (ref 22–32)
Calcium: 7.3 mg/dL — ABNORMAL LOW (ref 8.9–10.3)
Chloride: 98 mmol/L (ref 98–111)
Creatinine, Ser: 3.31 mg/dL — ABNORMAL HIGH (ref 0.61–1.24)
GFR, Estimated: 19 mL/min — ABNORMAL LOW (ref >60)
Glucose, Bld: 230 mg/dL — ABNORMAL HIGH (ref 70–99)
Potassium: 3.4 mmol/L — ABNORMAL LOW (ref 3.5–5.1)
Sodium: 129 mmol/L — ABNORMAL LOW (ref 135–145)

## 2020-10-20 MED ORDER — ALUM & MAG HYDROXIDE-SIMETH 200-200-20 MG/5ML PO SUSP
30.0000 mL | ORAL | Status: DC | PRN
  Administered 2020-10-20 – 2020-10-28 (×10): 30 mL via ORAL
  Filled 2020-10-20 (×11): qty 30

## 2020-10-20 NOTE — Plan of Care (Signed)
  Problem: Education: Goal: Knowledge of General Education information will improve Description Including pain rating scale, medication(s)/side effects and non-pharmacologic comfort measures Outcome: Progressing   Problem: Health Behavior/Discharge Planning: Goal: Ability to manage health-related needs will improve Outcome: Progressing   

## 2020-10-20 NOTE — Progress Notes (Signed)
PROGRESS NOTE    KABIR BRANNOCK  ZOX:[id number] DOB: [birth year] DOA: 10/12/2020 PCP: Patient, No Pcp Per   Chief Complaint  Patient presents with  . Near Syncope   Brief Narrative: 68o yo male with history of diabetes mellitus, CKD stage V started recently on dialysis Monday Wednesday Friday and still making urine, recent A. fib and was restarted on amiodarone metoprolol and Eliquis was sent from [facility] center for new syncopal event and orthostasis.  As per report he was discharged last week from [place], he is on hemodialysis center since Monday, he does report syncope on Monday after dialysis, as well he does report orthostasis and near syncope for last couple days which he was started on midodrine by his nephrologist, report this morning prior to dialysis he felt dizzy and lightheaded where he was hypotensive for which they sent him to ED, patient report he is still making good amount of urine daily, reports his weight 2 weeks ago was 29.2 kg, and during most dialysis session was at 76,as well he does report diarrhea x2 weeks, he denies fever, chills or abdominal pain, he does report episodes of vomiting on Wednesday, but no recurrence, overall he reports good oral intake and fluid consumption.  Patient was noted to be orthostatic127/67 supine, 71/48 standing with no improvement despite receiving 500 cc x 2 bolus, Significant for potassium of 3.2, sodium of 131, nine 8.2, CT abdomen pelvis significant for wall thickening in the rectal area, with redundant dilated sigmoid colon, but no overt volvulus, so [facility] consulted to admit. Nephrology and GI was consulted. Had acute blood loss anemia needed 2 unit PRBC. And by GI underwent colonoscopy, poor prep were able to obtain biopsy and patient was monitored closely  Subjective: Rectal bleeding is tapering off, tolerating full liquid diet. Still c/o loose stool Hemoglobin is stable.  Assessment & Plan:  Near syncopal events with  orthostatic hypotension, on midodrine, recurrent syncope with orthostatic hypotension: from intravascular volume depletion blood loss anemia.S/P 2 unit PRBC and IV fluids. Echo w/ preserved LV function and grade 1 diastolic dysfunction.  Patient was still symptomatic with orthostatic vitals  11/19-laying 125/72, sitting 92/66, and then standing 56/43),  Midodrine increased to 10 mg, HD adjusted 11/19. Cont to monitor.  Colitis/rectal bleeding with acute blood loss anemia with recent diarrhea:He has had chronic diarrhea and recent work-up w/ GI panel and C. difficile are negative from 11/14 and 11/15. seen by GI-underwent colonoscopy 11/17-limited exam,biopsy showed severely active chronic nonspecific colitis with ulceration and ascending colon, and  in the sigmoid and descending colon severely active chronic nonspecific colitis with ulceration.On empiric Cipro Flagyl since admission, cont for 10 days. Per GI ischemic colitis less likely and also unlikely due to drug induced colitis, suspect inflammatory bowel disease and has been placed on IV steroids Solu-Medrol 60 mg daily 11/19-continue as per GI.  Acute blood loss anemia due to rectal bleeding:s/p 2 units PRBC, HB holding stable 8.3 g today.  Monitor.  Recent Labs  Lab 10/17/20 0645 10/17/20 1608 10/18/20 0445 10/19/20 0548 10/20/20 0615  HGB 8.1* 7.8* 7.7* 7.8* 8.3*  HCT 26.9* 26.0* 25.5* 25.2* 27.3*   ESRD started recently on dialysis MWF, s/p HD 11/19 and only 500 cc was removed due to orthostasis and soft blood pressures.    Recent paroxysmal A. fib and was started on amiodarone metoprolol and Eliquis:Holding anticoagulations due to his rectal bleeding and acute blood loss anemia will need GI clearance to resume anticoagulation as outpatient.  Patient understands risks and benefits of holding anticoagulation.  Hyponatremia, he does make some urine, sodium low at 129 continue to adjust with dialysis.   Hyperlipidemia: Continue  statins.  Low albumin: Augment diet, dietitian consulted.  Hypocalcemia:Due to low albumin.monitor.  Nutrition: Diet Order            Diet full liquid Room service appropriate? Yes; Fluid consistency: Thin  Diet effective now                  Body mass index is 21.54 kg/m.  Pressure Ulcer: Pressure Injury 10/13/20 Sacrum Circumferential Stage 2 -  Partial thickness loss of dermis presenting as a shallow open injury with a red, pink wound bed without slough. (Active)  10/13/20 1330  Location: Sacrum  Location Orientation: Circumferential  Staging: Stage 2 -  Partial thickness loss of dermis presenting as a shallow open injury with a red, pink wound bed without slough.  Wound Description (Comments):   Present on Admission: Yes    DVT prophylaxis: Place and maintain sequential compression device Start: 10/15/20 1209 Code Status:   Code Status: Full Code  Family Communication: plan of care discussed with patient at bedside.  Status is: Inpatient Remains inpatient appropriate because:IV treatments appropriate due to intensity of illness or inability to take PO and Inpatient level of care appropriate due to severity of illness  Dispo:  Patient From: Home  Planned Disposition: [place]  Expected discharge date:  Once CMV is back and diarrhea/rectal bleeding better. Likely in 48 hrs. He will need negative covid w/i 48 prior to SNF d/c  Medically stable for discharge: No  Consultants:see note  Procedures:see note  Culture/Microbiology No results found for: SDES, SPECREQUEST, CULT, REPTSTATUS  Other culture-see note  Medications: Scheduled Meds: . (feeding supplement) PROSource Plus  30 mL Oral BID BM  . sodium chloride   Intravenous Once  . amiodarone  200 mg Oral Daily  . calcitRIOL  0.5 mcg Oral Daily  . Chlorhexidine Gluconate Cloth  6 each Topical Q0600  . ciprofloxacin  500 mg Oral Daily  . feeding supplement  237 mL Oral BID BM  .  methylPREDNISolone (SOLU-MEDROL) injection  60 mg Intravenous Q24H  . metroNIDAZOLE  500 mg Oral Q8H  . midodrine  10 mg Oral TID with meals  . multivitamin  1 tablet Oral QHS  . simvastatin  20 mg Oral QPM  . sodium chloride flush  3 mL Intravenous Q12H   Continuous Infusions: . sodium chloride    . sodium chloride      Antimicrobials: Anti-infectives (From admission, onward)   Start     Dose/Rate Route Frequency Ordered Stop   10/13/20 1400  metroNIDAZOLE (FLAGYL) tablet 500 mg        500 mg Oral Every 8 hours 10/13/20 1215     10/13/20 1230  ciprofloxacin (CIPRO) tablet 500 mg        500 mg Oral Daily 10/13/20 1215       Objective: Vitals: Today's Vitals   10/19/20 2251 10/19/20 2257 10/20/20 0000 10/20/20 0615  BP: (!) 94/46 106/66  104/61  Pulse: 77 75  75  Resp: 16   16  Temp: 98.3 F (36.8 C)   98 F (36.7 C)  TempSrc:      SpO2: 96% 96%  97%  Weight:    76.1 kg  Height:      PainSc:   0-No pain     Intake/Output Summary (Last 24 hours) at  10/20/2020 0805 Last data filed at 10/20/2020 0239 Gross per 24 hour  Intake 390 ml  Output 1200 ml  Net -810 ml   Filed Weights   10/17/20 0432 10/18/20 0500 10/20/20 0615  Weight: 81.3 kg 80.7 kg 76.1 kg   Weight change:   Intake/Output from previous day: 11/19 0701 - 11/20 0700 In: 390 [P.O.:390] Out: 1200 [Urine:500; Stool:200] Intake/Output this shift: No intake/output data recorded.  Examination: General exam: AAOx3,NAD, weak appearing. HEENT:Oral mucosa moist, Ear/Nose WNL grossly, dentition normal. Respiratory system: bilaterally clear ,no wheezing or crackles,no use of accessory muscle Cardiovascular system: S1 & S2 +, No JVD,. Gastrointestinal system: Abdomen soft, NT,ND, BS+ Nervous System:Alert, awake, moving extremities and grossly nonfocal Extremities: No edema, distal peripheral pulses palpable.  Skin: No rashes,no icterus. MSK: Normal muscle bulk,tone, power  Data Reviewed: I have personally  reviewed following labs and imaging studies CBC: Recent Labs  Lab 10/17/20 0645 10/17/20 1608 10/18/20 0445 10/19/20 0548 10/20/20 0615  WBC 5.8 5.5 5.3 6.7 4.2  HGB 8.1* 7.8* 7.7* 7.8* 8.3*  HCT 26.9* 26.0* 25.5* 25.2* 27.3*  MCV 92.4 92.5 91.7 92.6 92.2  PLT 403* 410* 384 416* 409   Basic Metabolic Panel: Recent Labs  Lab 10/13/20 1452 10/14/20 0550 10/14/20 0550 10/15/20 0627 10/15/20 0627 10/16/20 0758 10/17/20 0645 10/18/20 0445 10/19/20 0548 10/20/20 0615  NA  --  132*   < > 130*   < > 130* 130* 132* 130* 129*  K  --  3.6   < > 3.5   < > 3.8 3.7 3.7 3.5 3.4*  CL  --  99   < > 102   < > 99 102 101 101 98  CO2  --  23   < > 20*   < > 22 20* 22 21* 23  GLUCOSE  --  88   < > 99   < > 97 97 86 118* 230*  BUN  --  25*   < > 43*   < > 34* 43* 29* 33* 25*  CREATININE  --  3.18*   < > 4.51*   < > 3.58* 4.21* 2.95* 3.87* 3.31*  CALCIUM  --  7.0*   < > 6.9*   < > 6.9* 7.1* 7.2* 7.3* 7.3*  MG  --   --   --   --   --   --  1.5*  --   --   --   PHOS 4.3 3.0  --  3.3  --  2.5 3.2  --   --   --    < > = values in this interval not displayed.   GFR: Estimated Creatinine Clearance: 22.4 mL/min (A) (by C-G formula based on SCr of 3.31 mg/dL (H)). Liver Function Tests: Recent Labs  Lab 10/14/20 0550 10/15/20 0627 10/16/20 0758 10/17/20 0645  ALBUMIN 2.0*  2.0* 1.8* 1.7* 1.7*   No results for input(s): LIPASE, AMYLASE in the last 168 hours. No results for input(s): AMMONIA in the last 168 hours. Coagulation Profile: No results for input(s): INR, PROTIME in the last 168 hours. Cardiac Enzymes: No results for input(s): CKTOTAL, CKMB, CKMBINDEX, TROPONINI in the last 168 hours. BNP (last 3 results) No results for input(s): PROBNP in the last 8760 hours. HbA1C: No results for input(s): HGBA1C in the last 72 hours. CBG: Recent Labs  Lab 10/14/20 0535 10/14/20 0737 10/15/20 0536  GLUCAP 88 88 98   Lipid Profile: No results for input(s): CHOL, HDL, LDLCALC, TRIG,  CHOLHDL, LDLDIRECT in the last 72 hours. Thyroid Function Tests: No results for input(s): TSH, T4TOTAL, FREET4, T3FREE, THYROIDAB in the last 72 hours. Anemia Panel: No results for input(s): VITAMINB12, FOLATE, FERRITIN, TIBC, IRON, RETICCTPCT in the last 72 hours. Sepsis Labs: No results for input(s): PROCALCITON, LATICACIDVEN in the last 168 hours.  Recent Results (from the past 240 hour(s))  Respiratory Panel by RT PCR (Flu A&B, Covid) - Nasopharyngeal Swab     Status: None   Collection Time: 10/12/20  3:13 PM   Specimen: Nasopharyngeal Swab  Result Value Ref Range Status   SARS Coronavirus 2 by RT PCR NEGATIVE NEGATIVE Final    Comment: (NOTE) SARS-CoV-2 target nucleic acids are NOT DETECTED.  The SARS-CoV-2 RNA is generally detectable in upper respiratoy specimens during the acute phase of infection. The lowest concentration of SARS-CoV-2 viral copies this assay can detect is 131 copies/mL. A negative result does not preclude SARS-Cov-2 infection and should not be used as the sole basis for treatment or other patient management decisions. A negative result may occur with  improper specimen collection/handling, submission of specimen other than nasopharyngeal swab, presence of viral mutation(s) within the areas targeted by this assay, and inadequate number of viral copies (<131 copies/mL). A negative result must be combined with clinical observations, patient history, and epidemiological information. The expected result is Negative.  Fact Sheet for Patients:  PinkCheek.be  Fact Sheet for Healthcare Providers:  GravelBags.it  This test is no t yet approved or cleared by the [place] FDA and  has been authorized for detection and/or diagnosis of SARS-CoV-2 by FDA under an Emergency Use Authorization (EUA). This EUA will remain  in effect (meaning this test can be used) for the duration of the COVID-19 declaration  under Section 564(b)(1) of the Act, 21 U.S.C. section 360bbb-3(b)(1), unless the authorization is terminated or revoked sooner.     Influenza A by PCR NEGATIVE NEGATIVE Final   Influenza B by PCR NEGATIVE NEGATIVE Final    Comment: (NOTE) The Xpert Xpress SARS-CoV-2/FLU/RSV assay is intended as an aid in  the diagnosis of influenza from Nasopharyngeal swab specimens and  should not be used as a sole basis for treatment. Nasal washings and  aspirates are unacceptable for Xpert Xpress SARS-CoV-2/FLU/RSV  testing.  Fact Sheet for Patients: PinkCheek.be  Fact Sheet for Healthcare Providers: GravelBags.it  This test is not yet approved or cleared by the [place] FDA and  has been authorized for detection and/or diagnosis of SARS-CoV-2 by  FDA under an Emergency Use Authorization (EUA). This EUA will remain  in effect (meaning this test can be used) for the duration of the  Covid-19 declaration under Section 564(b)(1) of the Act, 21  U.S.C. section 360bbb-3(b)(1), unless the authorization is  terminated or revoked. Performed at [facility], [address] [place], [place] [address]   MRSA PCR Screening     Status: None   Collection Time: 10/13/20  2:35 PM   Specimen: Nasopharyngeal  Result Value Ref Range Status   MRSA by PCR NEGATIVE NEGATIVE Final    Comment:        The GeneXpert MRSA Assay (FDA approved for NASAL specimens only), is one component of a comprehensive MRSA colonization surveillance program. It is not intended to diagnose MRSA infection nor to guide or monitor treatment for MRSA infections. Performed at [facility], [address] [place], [place] [address]   C Difficile Quick Screen w PCR reflex     Status: None  Collection Time: 10/14/20  2:15 PM   Specimen: Stool  Result Value Ref Range Status   C Diff antigen NEGATIVE NEGATIVE Final   C Diff toxin NEGATIVE NEGATIVE Final   C Diff  interpretation No C. difficile detected.  Final    Comment: Performed at [facility], [address] [place], [place] [address]  Gastrointestinal Panel by PCR , Stool     Status: None   Collection Time: 10/15/20 11:02 AM   Specimen: Stool  Result Value Ref Range Status   Campylobacter species NOT DETECTED NOT DETECTED Final   Plesimonas shigelloides NOT DETECTED NOT DETECTED Final   Salmonella species NOT DETECTED NOT DETECTED Final   Yersinia enterocolitica NOT DETECTED NOT DETECTED Final   Vibrio species NOT DETECTED NOT DETECTED Final   Vibrio cholerae NOT DETECTED NOT DETECTED Final   Enteroaggregative E coli (EAEC) NOT DETECTED NOT DETECTED Final   Enteropathogenic E coli (EPEC) NOT DETECTED NOT DETECTED Final   Enterotoxigenic E coli (ETEC) NOT DETECTED NOT DETECTED Final   Shiga like toxin producing E coli (STEC) NOT DETECTED NOT DETECTED Final   Shigella/Enteroinvasive E coli (EIEC) NOT DETECTED NOT DETECTED Final   Cryptosporidium NOT DETECTED NOT DETECTED Final   Cyclospora cayetanensis NOT DETECTED NOT DETECTED Final   Entamoeba histolytica NOT DETECTED NOT DETECTED Final   Giardia lamblia NOT DETECTED NOT DETECTED Final   Adenovirus F40/41 NOT DETECTED NOT DETECTED Final   Astrovirus NOT DETECTED NOT DETECTED Final   Norovirus GI/GII NOT DETECTED NOT DETECTED Final   Rotavirus A NOT DETECTED NOT DETECTED Final   Sapovirus (I, II, IV, and V) NOT DETECTED NOT DETECTED Final    Comment: Performed at [facility], [address] [place], [place] [address]    Radiology Studies: No results found.   LOS: 7 days   Antonieta Pert, MD [facility]  10/20/2020, 8:05 AM

## 2020-10-20 NOTE — Progress Notes (Signed)
[facility] ROUNDING NOTE   Subjective:   Brief history: 69 year old gentleman with a history of diabetes mellitus and atrial fibrillation on anticoagulation.  End-stage renal disease Monday Wednesday Friday [facility].  Was admitted 10/12/2020 with a syncopal spell.  It was thought that patient was orthostatic with a differential in blood pressure measurements between sitting and standing.  He is on midodrine 10 mg 3 times daily with caution on volume removal during dialysis treatments.  2D echo showed diastolic dysfunction.  CT scan also showed acute colitis and is being treated with Cipro and Flagyl.  I wonder if this could be ischemic in nature.  Blood pressure 104/61 pulse 75 temperature 98 O2 sats 97% room air  Sodium 129 potassium 3.4 chloride 98 CO2 23 BUN 25 creatinine 3.3 glucose 230 calcium 7.3 hemoglobin 8.3  Last dialysis 10/19/2020 removal of 500 cc  Objective:  Vital signs in last 24 hours:  Temp:  [98 F (36.7 C)-98.4 F (36.9 C)] 98 F (36.7 C) (11/20 0615) Pulse Rate:  [75-89] 75 (11/20 0615) Resp:  [16-20] 16 (11/20 0615) BP: (94-129)/(46-76) 104/61 (11/20 0615) SpO2:  [96 %-98 %] 97 % (11/20 0615) Weight:  [76.1 kg] 76.1 kg (11/20 0615)  Weight change:  Filed Weights   10/17/20 0432 10/18/20 0500 10/20/20 0615  Weight: 81.3 kg 80.7 kg 76.1 kg    Intake/Output: I/O last 3 completed shifts: In: 847.5 [P.O.:390; I.V.:457.5] Out: 8242 [Urine:975; Other:500; Stool:200]   Intake/Output this shift:  No intake/output data recorded.  Gen: NAD CVS: RRR, no rub Resp: cta bl Abd: +BS, soft, Nt/ND Ext: 1+ edema of bilateral ankles Neuro: speech clear and coherent, moves all ext spontaneously Access: [facility] c/d/i   Basic Metabolic Panel: Recent Labs  Lab 10/13/20 1452 10/14/20 0550 10/14/20 0550 10/15/20 0627 10/15/20 0627 10/16/20 0758 10/16/20 0758 10/17/20 0645 10/17/20 0645 10/18/20 0445 10/19/20 0548 10/20/20 0615  NA  --   132*   < > 130*   < > 130*  --  130*  --  132* 130* 129*  K  --  3.6   < > 3.5   < > 3.8  --  3.7  --  3.7 3.5 3.4*  CL  --  99   < > 102   < > 99  --  102  --  101 101 98  CO2  --  23   < > 20*   < > 22  --  20*  --  22 21* 23  GLUCOSE  --  88   < > 99   < > 97  --  97  --  86 118* 230*  BUN  --  25*   < > 43*   < > 34*  --  43*  --  29* 33* 25*  CREATININE  --  3.18*   < > 4.51*   < > 3.58*  --  4.21*  --  2.95* 3.87* 3.31*  CALCIUM  --  7.0*   < > 6.9*   < > 6.9*   < > 7.1*   < > 7.2* 7.3* 7.3*  MG  --   --   --   --   --   --   --  1.5*  --   --   --   --   PHOS 4.3 3.0  --  3.3  --  2.5  --  3.2  --   --   --   --    < > =  values in this interval not displayed.    Liver Function Tests: Recent Labs  Lab 10/14/20 0550 10/15/20 0627 10/16/20 0758 10/17/20 0645  ALBUMIN 2.0*  2.0* 1.8* 1.7* 1.7*   No results for input(s): LIPASE, AMYLASE in the last 168 hours. No results for input(s): AMMONIA in the last 168 hours.  CBC: Recent Labs  Lab 10/17/20 0645 10/17/20 1608 10/18/20 0445 10/19/20 0548 10/20/20 0615  WBC 5.8 5.5 5.3 6.7 4.2  HGB 8.1* 7.8* 7.7* 7.8* 8.3*  HCT 26.9* 26.0* 25.5* 25.2* 27.3*  MCV 92.4 92.5 91.7 92.6 92.2  PLT 403* 410* 384 416* 366    Cardiac Enzymes: No results for input(s): CKTOTAL, CKMB, CKMBINDEX, TROPONINI in the last 168 hours.  BNP: Invalid input(s): POCBNP  CBG: Recent Labs  Lab 10/14/20 0535 10/14/20 0737 10/15/20 0536  GLUCAP 88 88 98    Microbiology: Results for orders placed or performed during the hospital encounter of 10/12/20  Respiratory Panel by RT PCR (Flu A&B, Covid) - Nasopharyngeal Swab     Status: None   Collection Time: 10/12/20  3:13 PM   Specimen: Nasopharyngeal Swab  Result Value Ref Range Status   SARS Coronavirus 2 by RT PCR NEGATIVE NEGATIVE Final    Comment: (NOTE) SARS-CoV-2 target nucleic acids are NOT DETECTED.  The SARS-CoV-2 RNA is generally detectable in upper respiratoy specimens during the  acute phase of infection. The lowest concentration of SARS-CoV-2 viral copies this assay can detect is 131 copies/mL. A negative result does not preclude SARS-Cov-2 infection and should not be used as the sole basis for treatment or other patient management decisions. A negative result may occur with  improper specimen collection/handling, submission of specimen other than nasopharyngeal swab, presence of viral mutation(s) within the areas targeted by this assay, and inadequate number of viral copies (<131 copies/mL). A negative result must be combined with clinical observations, patient history, and epidemiological information. The expected result is Negative.  Fact Sheet for Patients:  PinkCheek.be  Fact Sheet for Healthcare Providers:  GravelBags.it  This test is no t yet approved or cleared by the [place] FDA and  has been authorized for detection and/or diagnosis of SARS-CoV-2 by FDA under an Emergency Use Authorization (EUA). This EUA will remain  in effect (meaning this test can be used) for the duration of the COVID-19 declaration under Section 564(b)(1) of the Act, 21 U.S.C. section 360bbb-3(b)(1), unless the authorization is terminated or revoked sooner.     Influenza A by PCR NEGATIVE NEGATIVE Final   Influenza B by PCR NEGATIVE NEGATIVE Final    Comment: (NOTE) The Xpert Xpress SARS-CoV-2/FLU/RSV assay is intended as an aid in  the diagnosis of influenza from Nasopharyngeal swab specimens and  should not be used as a sole basis for treatment. Nasal washings and  aspirates are unacceptable for Xpert Xpress SARS-CoV-2/FLU/RSV  testing.  Fact Sheet for Patients: PinkCheek.be  Fact Sheet for Healthcare Providers: GravelBags.it  This test is not yet approved or cleared by the [place] FDA and  has been authorized for detection and/or diagnosis  of SARS-CoV-2 by  FDA under an Emergency Use Authorization (EUA). This EUA will remain  in effect (meaning this test can be used) for the duration of the  Covid-19 declaration under Section 564(b)(1) of the Act, 21  U.S.C. section 360bbb-3(b)(1), unless the authorization is  terminated or revoked. Performed at [facility], [address] [place], [place] [address]   MRSA PCR Screening     Status: None  Collection Time: 10/13/20  2:35 PM   Specimen: Nasopharyngeal  Result Value Ref Range Status   MRSA by PCR NEGATIVE NEGATIVE Final    Comment:        The GeneXpert MRSA Assay (FDA approved for NASAL specimens only), is one component of a comprehensive MRSA colonization surveillance program. It is not intended to diagnose MRSA infection nor to guide or monitor treatment for MRSA infections. Performed at [facility], [address] [place], [place] [address]   C Difficile Quick Screen w PCR reflex     Status: None   Collection Time: 10/14/20  2:15 PM   Specimen: Stool  Result Value Ref Range Status   C Diff antigen NEGATIVE NEGATIVE Final   C Diff toxin NEGATIVE NEGATIVE Final   C Diff interpretation No C. difficile detected.  Final    Comment: Performed at [facility], [address], [place], [place] [address]  Gastrointestinal Panel by PCR , Stool     Status: None   Collection Time: 10/15/20 11:02 AM   Specimen: Stool  Result Value Ref Range Status   Campylobacter species NOT DETECTED NOT DETECTED Final   Plesimonas shigelloides NOT DETECTED NOT DETECTED Final   Salmonella species NOT DETECTED NOT DETECTED Final   Yersinia enterocolitica NOT DETECTED NOT DETECTED Final   Vibrio species NOT DETECTED NOT DETECTED Final   Vibrio cholerae NOT DETECTED NOT DETECTED Final   Enteroaggregative E coli (EAEC) NOT DETECTED NOT DETECTED Final   Enteropathogenic E coli (EPEC) NOT DETECTED NOT DETECTED Final   Enterotoxigenic E coli (ETEC) NOT DETECTED NOT DETECTED Final   Shiga  like toxin producing E coli (STEC) NOT DETECTED NOT DETECTED Final   Shigella/Enteroinvasive E coli (EIEC) NOT DETECTED NOT DETECTED Final   Cryptosporidium NOT DETECTED NOT DETECTED Final   Cyclospora cayetanensis NOT DETECTED NOT DETECTED Final   Entamoeba histolytica NOT DETECTED NOT DETECTED Final   Giardia lamblia NOT DETECTED NOT DETECTED Final   Adenovirus F40/41 NOT DETECTED NOT DETECTED Final   Astrovirus NOT DETECTED NOT DETECTED Final   Norovirus GI/GII NOT DETECTED NOT DETECTED Final   Rotavirus A NOT DETECTED NOT DETECTED Final   Sapovirus (I, II, IV, and V) NOT DETECTED NOT DETECTED Final    Comment: Performed at [facility], [address] [place], [place] [address]    Coagulation Studies: No results for input(s): LABPROT, INR in the last 72 hours.  Urinalysis: No results for input(s): COLORURINE, LABSPEC, PHURINE, GLUCOSEU, HGBUR, BILIRUBINUR, KETONESUR, PROTEINUR, UROBILINOGEN, NITRITE, LEUKOCYTESUR in the last 72 hours.  Invalid input(s): APPERANCEUR    Imaging: No results found.   Medications:   . sodium chloride    . sodium chloride     . (feeding supplement) PROSource Plus  30 mL Oral BID BM  . sodium chloride   Intravenous Once  . amiodarone  200 mg Oral Daily  . calcitRIOL  0.5 mcg Oral Daily  . Chlorhexidine Gluconate Cloth  6 each Topical Q0600  . ciprofloxacin  500 mg Oral Daily  . feeding supplement  237 mL Oral BID BM  . methylPREDNISolone (SOLU-MEDROL) injection  60 mg Intravenous Q24H  . metroNIDAZOLE  500 mg Oral Q8H  . midodrine  10 mg Oral TID with meals  . multivitamin  1 tablet Oral QHS  . simvastatin  20 mg Oral QPM  . sodium chloride flush  3 mL Intravenous Q12H   sodium chloride, sodium chloride, alteplase, alum & mag hydroxide-simeth, diphenoxylate-atropine, heparin, ondansetron (ZOFRAN) IV,  prochlorperazine  Assessment/ Plan:  1. Syncope/hypotension- presumably volume depletion due to diarrhea and UF with HD, but  despite IVF's and no UF with HD, he had another episode of symptomatic orthostasis. 1. ECHO with preserved LV function and grade I diastolic dysfunction.  No LVH. 2. On midodrine increased to 10 mg 3 times daily 3. Stop fluids, discussed with primary 2. Colitis- GI following and on cipro/flagyl. s/p cscope 11/17: poor prep with incomplete exam, blood in the rectum and sigmoid colon, in the descending colon and at the splenic flexure; colitis involving descending and sigmoid colons seen.  Biopsies performed. CMV pending 3. [facility].   Last HD 10/19/2020 with 500 cc removed 4. Anemia:of CKD- s/p blood transfusion. Follow h/H, no heparin with HD.  GI on board, cscope findings as above. Transfuse prn 5. 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 Resume calcitriol 0.1mcg daily (was on this as an outpatient). Corrected calcium ~8.9 11/17 6. Nutrition:per primary, push protein 7. H/o Hypertension:see #1 8. Vascular access- will need to f/u with Dr. Donnetta Hutching once stable for discharge. 9. A fib- on amiodarone and eliquis (off A/C as of right now) 10. Hyponatremia.  Noted   LOS: [place] @TODAY @9 :09 AM

## 2020-10-20 NOTE — Progress Notes (Addendum)
Patient tolerating full liquids. Rectal bleeding is tapering off; still with some loose stools  Vital signs in last 24 hours: Temp:  [98 F (36.7 C)-98.4 F (36.9 C)] 98 F (36.7 C) (11/20 0615) Pulse Rate:  [75-89] 75 (11/20 0615) Resp:  [16-20] 16 (11/20 0615) BP: (94-129)/(46-76) 104/61 (11/20 0615) SpO2:  [96 %-98 %] 97 % (11/20 0615) Weight:  [76.1 kg] 76.1 kg (11/20 0615) Last BM Date: 10/19/20 General:   Alert,   pleasant and cooperative in NAD Abdomen: Nondistended.  Positive bowel sounds.  Soft and nontender.      Intake/Output from previous day: 11/19 0701 - 11/20 0700 In: 390 [P.O.:390] Out: 1200 [Urine:500; Stool:200] Intake/Output this shift: No intake/output data recorded.  Lab Results: Recent Labs    10/18/20 0445 10/19/20 0548 10/20/20 0615  WBC 5.3 6.7 4.2  HGB 7.7* 7.8* 8.3*  HCT 25.5* 25.2* 27.3*  PLT 384 416* 366   BMET Recent Labs    10/18/20 0445 10/19/20 0548 10/20/20 0615  NA 132* 130* 129*  K 3.7 3.5 3.4*  CL 101 101 98  CO2 22 21* 23  GLUCOSE 86 118* 230*  BUN 29* 33* 25*  CREATININE 2.95* 3.87* 3.31*  CALCIUM 7.2* 7.3* 7.3*      Impression: Pleasant 69 year old gentleman with recent diarrhea/rectal bleeding in the setting of episodic hypotension and anticoagulation.  Limited colonoscopy findings suspicious for segmental or ischemic colitis. GIP, C. difficile, CMV stains all negative. Patient is clinically improved on Cipro Flagyl and Solu-Medrol.  Recommendations: Complete 10-day course of Cipro and Flagyl.  I would favor a relatively rapid taper on the Solu-Medrol and close interval reassessment as an outpatient.  He will need to return for a complete colonoscopy at some point in the near future.  Will advance to a low residue carbohydrate modified diet.

## 2020-10-21 DIAGNOSIS — R197 Diarrhea, unspecified: Secondary | ICD-10-CM | POA: Diagnosis not present

## 2020-10-21 DIAGNOSIS — N186 End stage renal disease: Secondary | ICD-10-CM | POA: Diagnosis not present

## 2020-10-21 LAB — CBC
HCT: 25.2 % — ABNORMAL LOW (ref 39.0–52.0)
Hemoglobin: 7.8 g/dL — ABNORMAL LOW (ref 13.0–17.0)
MCH: 28.4 pg (ref 26.0–34.0)
MCHC: 31 g/dL (ref 30.0–36.0)
MCV: 91.6 fL (ref 80.0–100.0)
Platelets: 413 10*3/uL — ABNORMAL HIGH (ref 150–400)
RBC: 2.75 MIL/uL — ABNORMAL LOW (ref 4.22–5.81)
RDW: 18.5 % — ABNORMAL HIGH (ref 11.5–15.5)
WBC: 6.9 10*3/uL (ref 4.0–10.5)
nRBC: 0 % (ref 0.0–0.2)

## 2020-10-21 MED ORDER — CHLORHEXIDINE GLUCONATE CLOTH 2 % EX PADS
6.0000 | MEDICATED_PAD | Freq: Every day | CUTANEOUS | Status: DC
  Administered 2020-10-24 – 2020-10-31 (×6): 6 via TOPICAL

## 2020-10-21 NOTE — Progress Notes (Signed)
PROGRESS NOTE    [name]  NGE:[id number] DOB: 12-Sep-1951 DOA: 10/12/2020 PCP: Patient, No Pcp Per   Chief Complaint  Patient presents with  . Near Syncope   Brief Narrative: 19o yo male with history of diabetes mellitus, CKD stage V started recently on dialysis Monday Wednesday Friday and still making urine, recent A. fib and was restarted on amiodarone metoprolol and Eliquis was sent from [facility]mad[facility] center for new syncopal event and orthostasis.  As per report he was discharged last week from [place], he is on hemodialysis center since Monday, he does report syncope on Monday after dialysis, as well he does report orthostasis and near syncope for last couple days which he was started on midodrine by his nephrologist, report this morning prior to dialysis he felt dizzy and lightheaded where he was hypotensive for which they sent him to ED, patient report he is still making good amount of urine daily, reports his weight 2 weeks ago was 29.2 kg, and during most dialysis session was at 76,as well he does report diarrhea x2 weeks, he denies fever, chills or abdominal pain, he does report episodes of vomiting on Wednesday, but no recurrence, overall he reports good oral intake and fluid consumption.  Patient was noted to be orthostatic127/67 supine, 71/48 standing with no improvement despite receiving 500 cc x 2 bolus, Significant for potassium of 3.2, sodium of 131, nine 8.2, CT abdomen pelvis significant for wall thickening in the rectal area, with redundant dilated sigmoid colon, but no overt volvulus, so [facility] consulted to admit. Nephrology and GI was consulted. Had acute blood loss aded 2 unit PRBC. And by GI underwent colonoscopy, poor prep were able to obtain biopsy and patient was monitored closely. Patient was placed on IV steroid with improving diarrhea.  Subjective: Patient reports he had no  bowel yesterday and had 1 during the night and it is improving Hemoglobin down to  7.8 g.  Assessment & Plan:  Near syncopal events with orthostatic hypotension, on midodrine, recurrent syncope with orthostatic hypotension: from intravascular volume depletion blood loss anemia.S/P 2 unit PRBC and IV fluids. Echo w/ preserved LV function and grade 1 diastolic dysfunction.  Patient was still symptomatic with orthostatic vitals  11/19-laying 125/72, sitting 92/66, and then standing 56/43),  Midodrine increased to 10 mg, HD adjusted 11/19.  He has not gotten up.  Will order orthostatic vitals  Colitis/rectal bleeding with acute blood loss anemia with recent diarrhea:He has had chronic diarrhea and recent work-up w/ GI panel and C. difficile are negative from 11/14 and 11/15. seen by GI-underwent colonoscopy 11/17-limited exam,biopsy showed severely active chronic nonspecific colitis with ulceration and ascending colon, and  in the sigmoid and descending colon severely active chronic nonspecific colitis with ulceration.keep Cipro Flagyl x10 days, started on Solu-Medrol 60 mg daily 11/19 as per GI .he seems to be responding.  Repeat CBC in a.m.   Acute blood loss anemia due to rectal bleeding:s/p 2 units PRBC, slightly downtrending.  But overall rectal bleeding is improving.   Recent Labs  Lab 10/17/20 1608 10/18/20 0445 10/19/20 0548 10/20/20 0615 10/21/20 0609  HGB 7.8* 7.7* 7.8* 8.3* 7.8*  HCT 26.0* 25.5* 25.2* 27.3* 25.2*   ESRD started recently on dialysis MWF, s/p HD 11/19 and only 500 cc was removed due to orthostasis and soft blood pressures.  For dialysis tomorrow.  Recent paroxysmal A. fib and was started on amiodarone metoprolol and Eliquis:Holding anticoagulations due to his rectal bleeding and acute blood loss anemia  will need GI clearance to resume anticoagulation as outpatient.  Currently on hold given his severe colitis. Patient understands risks and benefits of holding anticoagulation and he seems to be reluctant to start it again-we will need close follow-up with  his cardiologist/PCP.  Hyponatremia, monitor labs.  Hyperlipidemia: Continue statins. Low albumin: Augment diet, dietitian consulted. Hypocalcemia:Due to low albumin.monitor.  Nutrition: Diet Order            Diet Carb Modified Fluid consistency: Thin; Room service appropriate? Yes  Diet effective now                  Body mass index is 21.54 kg/m.  Pressure Ulcer: Pressure Injury 10/13/20 Sacrum Circumferential Stage 2 -  Partial thickness loss of dermis presenting as a shallow open injury with a red, pink wound bed without slough. (Active)  10/13/20 1330  Location: Sacrum  Location Orientation: Circumferential  Staging: Stage 2 -  Partial thickness loss of dermis presenting as a shallow open injury with a red, pink wound bed without slough.  Wound Description (Comments):   Present on Admission: Yes    DVT prophylaxis: Place and maintain sequential compression device Start: 10/15/20 1209 Code Status:   Code Status: Full Code  Family Communication: plan of care discussed with patient at bedside.  Status is: Inpatient Remains inpatient appropriate because:IV treatments appropriate due to intensity of illness or inability to take PO and Inpatient level of care appropriate due to severity of illness  Dispo:  Patient From: Home  Planned Disposition: [place]  Expected discharge date: Hopefully tomorrow after dialysis, will order Covid  swab in am.  Medically stable for discharge: No  Consultants:see note  Procedures:see note  Culture/Microbiology No results found for: SDES, SPECREQUEST, CULT, REPTSTATUS  Other culture-see note  Medications: Scheduled Meds: . (feeding supplement) PROSource Plus  30 mL Oral BID BM  . sodium chloride   Intravenous Once  . amiodarone  200 mg Oral Daily  . calcitRIOL  0.5 mcg Oral Daily  . Chlorhexidine Gluconate Cloth  6 each Topical Q0600  . Chlorhexidine Gluconate Cloth  6 each Topical Q0600  . ciprofloxacin  500 mg  Oral Daily  . feeding supplement  237 mL Oral BID BM  . methylPREDNISolone (SOLU-MEDROL) injection  60 mg Intravenous Q24H  . metroNIDAZOLE  500 mg Oral Q8H  . midodrine  10 mg Oral TID with meals  . multivitamin  1 tablet Oral QHS  . simvastatin  20 mg Oral QPM  . sodium chloride flush  3 mL Intravenous Q12H   Continuous Infusions: . sodium chloride    . sodium chloride      Antimicrobials: Anti-infectives (From admission, onward)   Start     Dose/Rate Route Frequency Ordered Stop   10/13/20 1400  metroNIDAZOLE (FLAGYL) tablet 500 mg        500 mg Oral Every 8 hours 10/13/20 1215     10/13/20 1230  ciprofloxacin (CIPRO) tablet 500 mg        500 mg Oral Daily 10/13/20 1215       Objective: Vitals: Today's Vitals   10/20/20 1100 10/20/20 1457 10/20/20 2035 10/21/20 0558  BP:  139/78 125/67 (!) 149/77  Pulse:  66 78 66  Resp:  16 16 16   Temp:   98.4 F (36.9 C) (!) 97.4 F (36.3 C)  TempSrc:   Oral Oral  SpO2:  96% 98% 97%  Weight:    76.1 kg  Height:  PainSc: 0-No pain       Intake/Output Summary (Last 24 hours) at 10/21/2020 1022 Last data filed at 10/21/2020 0930 Gross per 24 hour  Intake 960 ml  Output 800 ml  Net 160 ml   Filed Weights   10/18/20 0500 10/20/20 0615 10/21/20 0558  Weight: 80.7 kg 76.1 kg 76.1 kg   Weight change: 0 kg  Intake/Output from previous day: 11/20 0701 - 11/21 0700 In: 960 [P.O.:960] Out: 600 [Urine:600] Intake/Output this shift: Total I/O In: -  Out: 200 [Urine:200]  Examination: General exam: AAOx3 , NAD, weak appearing. HEENT:Oral mucosa moist, Ear/Nose WNL grossly, dentition normal. Respiratory system: bilaterally clear,no wheezing or crackles,no use of accessory muscle Cardiovascular system: S1 & S2 +, No JVD,. Gastrointestinal system: Abdomen soft, NT,ND, BS+ Nervous System:Alert, awake, moving extremities and grossly nonfocal Extremities: No edema, distal peripheral pulses palpable.  Skin: No rashes,no  icterus. MSK: Normal muscle bulk,tone, power  Data Reviewed: I have personally reviewed following labs and imaging studies CBC: Recent Labs  Lab 10/17/20 1608 10/18/20 0445 10/19/20 0548 10/20/20 0615 10/21/20 0609  WBC 5.5 5.3 6.7 4.2 6.9  HGB 7.8* 7.7* 7.8* 8.3* 7.8*  HCT 26.0* 25.5* 25.2* 27.3* 25.2*  MCV 92.5 91.7 92.6 92.2 91.6  PLT 410* 384 416* 366 629*   Basic Metabolic Panel: Recent Labs  Lab 10/15/20 0627 10/15/20 0627 10/16/20 0758 10/17/20 0645 10/18/20 0445 10/19/20 0548 10/20/20 0615  NA 130*   < > 130* 130* 132* 130* 129*  K 3.5   < > 3.8 3.7 3.7 3.5 3.4*  CL 102   < > 99 102 101 101 98  CO2 20*   < > 22 20* 22 21* 23  GLUCOSE 99   < > 97 97 86 118* 230*  BUN 43*   < > 34* 43* 29* 33* 25*  CREATININE 4.51*   < > 3.58* 4.21* 2.95* 3.87* 3.31*  CALCIUM 6.9*   < > 6.9* 7.1* 7.2* 7.3* 7.3*  MG  --   --   --  1.5*  --   --   --   PHOS 3.3  --  2.5 3.2  --   --   --    < > = values in this interval not displayed.   GFR: Estimated Creatinine Clearance: 22.4 mL/min (A) (by C-G formula based on SCr of 3.31 mg/dL (H)). Liver Function Tests: Recent Labs  Lab 10/15/20 0627 10/16/20 0758 10/17/20 0645  ALBUMIN 1.8* 1.7* 1.7*   No results for input(s): LIPASE, AMYLASE in the last 168 hours. No results for input(s): AMMONIA in the last 168 hours. Coagulation Profile: No results for input(s): INR, PROTIME in the last 168 hours. Cardiac Enzymes: No results for input(s): CKTOTAL, CKMB, CKMBINDEX, TROPONINI in the last 168 hours. BNP (last 3 results) No results for input(s): PROBNP in the last 8760 hours. HbA1C: No results for input(s): HGBA1C in the last 72 hours. CBG: Recent Labs  Lab 10/15/20 0536  GLUCAP 98   Lipid Profile: No results for input(s): CHOL, HDL, LDLCALC, TRIG, CHOLHDL, LDLDIRECT in the last 72 hours. Thyroid Function Tests: No results for input(s): TSH, T4TOTAL, FREET4, T3FREE, THYROIDAB in the last 72 hours. Anemia Panel: No  results for input(s): VITAMINB12, FOLATE, FERRITIN, TIBC, IRON, RETICCTPCT in the last 72 hours. Sepsis Labs: No results for input(s): PROCALCITON, LATICACIDVEN in the last 168 hours.  Recent Results (from the past 240 hour(s))  Respiratory Panel by RT PCR (Flu A&B, Covid) - Nasopharyngeal Swab  Status: None   Collection Time: 10/12/20  3:13 PM   Specimen: Nasopharyngeal Swab  Result Value Ref Range Status   SARS Coronavirus 2 by RT PCR NEGATIVE NEGATIVE Final    Comment: (NOTE) SARS-CoV-2 target nucleic acids are NOT DETECTED.  The SARS-CoV-2 RNA is generally detectable in upper respiratoy specimens during the acute phase of infection. The lowest concentration of SARS-CoV-2 viral copies this assay can detect is 131 copies/mL. A negative result does not preclude SARS-Cov-2 infection and should not be used as the sole basis for treatment or other patient management decisions. A negative result may occur with  improper specimen collection/handling, submission of specimen other than nasopharyngeal swab, presence of viral mutation(s) within the areas targeted by this assay, and inadequate number of viral copies (<131 copies/mL). A negative result must be combined with clinical observations, patient history, and epidemiological information. The expected result is Negative.  Fact Sheet for Patients:  PinkCheek.be  Fact Sheet for Healthcare Providers:  GravelBags.it  This test is no t yet approved or cleared by the [place] FDA and  has been authorized for detection and/or diagnosis of SARS-CoV-2 by FDA under an Emergency Use Authorization (EUA). This EUA will remain  in effect (meaning this test can be used) for the duration of the COVID-19 declaration under Section 564(b)(1) of the Act, 21 U.S.C. section 360bbb-3(b)(1), unless the authorization is terminated or revoked sooner.     Influenza A by PCR NEGATIVE NEGATIVE  Final   Influenza B by PCR NEGATIVE NEGATIVE Final    Comment: (NOTE) The Xpert Xpress SARS-CoV-2/FLU/RSV assay is intended as an aid in  the diagnosis of influenza from Nasopharyngeal swab specimens and  should not be used as a sole basis for treatment. Nasal washings and  aspirates are unacceptable for Xpert Xpress SARS-CoV-2/FLU/RSV  testing.  Fact Sheet for Patients: PinkCheek.be  Fact Sheet for Healthcare Providers: GravelBags.it  This test is not yet approved or cleared by the [place] FDA and  has been authorized for detection and/or diagnosis of SARS-CoV-2 by  FDA under an Emergency Use Authorization (EUA). This EUA will remain  in effect (meaning this test can be used) for the duration of the  Covid-19 declaration under Section 564(b)(1) of the Act, 21  U.S.C. section 360bbb-3(b)(1), unless the authorization is  terminated or revoked. Performed at [facility], [address], [place], [place] [address]   MRSA PCR Screening     Status: None   Collection Time: 10/13/20  2:35 PM   Specimen: Nasopharyngeal  Result Value Ref Range Status   MRSA by PCR NEGATIVE NEGATIVE Final    Comment:        The GeneXpert MRSA Assay (FDA approved for NASAL specimens only), is one component of a comprehensive MRSA colonization surveillance program. It is not intended to diagnose MRSA infection nor to guide or monitor treatment for MRSA infections. Performed at [facility], [address] [place], [place] [address]   C Difficile Quick Screen w PCR reflex     Status: None   Collection Time: 10/14/20  2:15 PM   Specimen: Stool  Result Value Ref Range Status   C Diff antigen NEGATIVE NEGATIVE Final   C Diff toxin NEGATIVE NEGATIVE Final   C Diff interpretation No C. difficile detected.  Final    Comment: Performed at [facility], [address], [place], [place] [address]  Gastrointestinal Panel by PCR , Stool     Status:  None   Collection Time: 10/15/20  11:02 AM   Specimen: Stool  Result Value Ref Range Status   Campylobacter species NOT DETECTED NOT DETECTED Final   Plesimonas shigelloides NOT DETECTED NOT DETECTED Final   Salmonella species NOT DETECTED NOT DETECTED Final   Yersinia enterocolitica NOT DETECTED NOT DETECTED Final   Vibrio species NOT DETECTED NOT DETECTED Final   Vibrio cholerae NOT DETECTED NOT DETECTED Final   Enteroaggregative E coli (EAEC) NOT DETECTED NOT DETECTED Final   Enteropathogenic E coli (EPEC) NOT DETECTED NOT DETECTED Final   Enterotoxigenic E coli (ETEC) NOT DETECTED NOT DETECTED Final   Shiga like toxin producing E coli (STEC) NOT DETECTED NOT DETECTED Final   Shigella/Enteroinvasive E coli (EIEC) NOT DETECTED NOT DETECTED Final   Cryptosporidium NOT DETECTED NOT DETECTED Final   Cyclospora cayetanensis NOT DETECTED NOT DETECTED Final   Entamoeba histolytica NOT DETECTED NOT DETECTED Final   Giardia lamblia NOT DETECTED NOT DETECTED Final   Adenovirus F40/41 NOT DETECTED NOT DETECTED Final   Astrovirus NOT DETECTED NOT DETECTED Final   Norovirus GI/GII NOT DETECTED NOT DETECTED Final   Rotavirus A NOT DETECTED NOT DETECTED Final   Sapovirus (I, II, IV, and V) NOT DETECTED NOT DETECTED Final    Comment: Performed at [facility], [address], [place], [place] [address]    Radiology Studies: No results found.   LOS: 8 days   Antonieta Pert, MD [facility]  10/21/2020, 10:22 AM

## 2020-10-21 NOTE — Progress Notes (Signed)
[facility] ROUNDING NOTE   Subjective:   Brief history: 69 year old gentleman with a history of diabetes mellitus and atrial fibrillation on anticoagulation.  End-stage renal disease Monday Wednesday Friday [facility].  Was admitted 10/12/2020 with a syncopal spell.  It was thought that patient was orthostatic with a differential in blood pressure measurements between sitting and standing.  He is on midodrine 10 mg 3 times daily with caution on volume removal during dialysis treatments.  2D echo showed diastolic dysfunction.  CT scan also showed acute colitis and is being treated with Cipro and Flagyl.  I wonder if this could be ischemic in nature.  Blood pressure 149/77 pulse 66 temperature 97.4 O2 sats 97% room air  Labs pending  Last dialysis 10/19/2020 with 500 cc removed  Objective:  Vital signs in last 24 hours:  Temp:  [97.4 F (36.3 C)-98.4 F (36.9 C)] 97.4 F (36.3 C) (11/21 0558) Pulse Rate:  [66-78] 66 (11/21 0558) Resp:  [16] 16 (11/21 0558) BP: (125-149)/(67-78) 149/77 (11/21 0558) SpO2:  [96 %-98 %] 97 % (11/21 0558) Weight:  [76.1 kg] 76.1 kg (11/21 0558)  Weight change: 0 kg Filed Weights   10/18/20 0500 10/20/20 0615 10/21/20 0558  Weight: 80.7 kg 76.1 kg 76.1 kg    Intake/Output: I/O last 3 completed shifts: In: 1200 [P.O.:1200] Out: 1100 [Urine:600; Other:500]   Intake/Output this shift:  No intake/output data recorded.  Gen: NAD CVS: RRR, no rub Resp: cta bl Abd: +BS, soft, Nt/ND Ext: 1+ edema of bilateral ankles Neuro: speech clear and coherent, moves all ext spontaneously Access: [facility] c/d/i   Basic Metabolic Panel: Recent Labs  Lab 10/15/20 0627 10/15/20 0627 10/16/20 0758 10/16/20 0758 10/17/20 0645 10/17/20 0645 10/18/20 0445 10/19/20 0548 10/20/20 0615  NA 130*   < > 130*  --  130*  --  132* 130* 129*  K 3.5   < > 3.8  --  3.7  --  3.7 3.5 3.4*  CL 102   < > 99  --  102  --  101 101 98  CO2 20*   < > 22  --   20*  --  22 21* 23  GLUCOSE 99   < > 97  --  97  --  86 118* 230*  BUN 43*   < > 34*  --  43*  --  29* 33* 25*  CREATININE 4.51*   < > 3.58*  --  4.21*  --  2.95* 3.87* 3.31*  CALCIUM 6.9*   < > 6.9*   < > 7.1*   < > 7.2* 7.3* 7.3*  MG  --   --   --   --  1.5*  --   --   --   --   PHOS 3.3  --  2.5  --  3.2  --   --   --   --    < > = values in this interval not displayed.    Liver Function Tests: Recent Labs  Lab 10/15/20 0627 10/16/20 0758 10/17/20 0645  ALBUMIN 1.8* 1.7* 1.7*   No results for input(s): LIPASE, AMYLASE in the last 168 hours. No results for input(s): AMMONIA in the last 168 hours.  CBC: Recent Labs  Lab 10/17/20 1608 10/18/20 0445 10/19/20 0548 10/20/20 0615 10/21/20 0609  WBC 5.5 5.3 6.7 4.2 6.9  HGB 7.8* 7.7* 7.8* 8.3* 7.8*  HCT 26.0* 25.5* 25.2* 27.3* 25.2*  MCV 92.5 91.7 92.6 92.2 91.6  PLT 410* 384 416* 366 413*    Cardiac Enzymes: No results for input(s): CKTOTAL, CKMB, CKMBINDEX, TROPONINI in the last 168 hours.  BNP: Invalid input(s): POCBNP  CBG: Recent Labs  Lab 10/15/20 0536  GLUCAP 98    Microbiology: Results for orders placed or performed during the hospital encounter of 10/12/20  Respiratory Panel by RT PCR (Flu A&B, Covid) - Nasopharyngeal Swab     Status: None   Collection Time: 10/12/20  3:13 PM   Specimen: Nasopharyngeal Swab  Result Value Ref Range Status   SARS Coronavirus 2 by RT PCR NEGATIVE NEGATIVE Final    Comment: (NOTE) SARS-CoV-2 target nucleic acids are NOT DETECTED.  The SARS-CoV-2 RNA is generally detectable in upper respiratoy specimens during the acute phase of infection. The lowest concentration of SARS-CoV-2 viral copies this assay can detect is 131 copies/mL. A negative result does not preclude SARS-Cov-2 infection and should not be used as the sole basis for treatment or other patient management decisions. A negative result may occur with  improper specimen collection/handling, submission of  specimen other than nasopharyngeal swab, presence of viral mutation(s) within the areas targeted by this assay, and inadequate number of viral copies (<131 copies/mL). A negative result must be combined with clinical observations, patient history, and epidemiological information. The expected result is Negative.  Fact Sheet for Patients:  PinkCheek.be  Fact Sheet for Healthcare Providers:  GravelBags.it  This test is no t yet approved or cleared by the [place] FDA and  has been authorized for detection and/or diagnosis of SARS-CoV-2 by FDA under an Emergency Use Authorization (EUA). This EUA will remain  in effect (meaning this test can be used) for the duration of the COVID-19 declaration under Section 564(b)(1) of the Act, 21 U.S.C. section 360bbb-3(b)(1), unless the authorization is terminated or revoked sooner.     Influenza A by PCR NEGATIVE NEGATIVE Final   Influenza B by PCR NEGATIVE NEGATIVE Final    Comment: (NOTE) The Xpert Xpress SARS-CoV-2/FLU/RSV assay is intended as an aid in  the diagnosis of influenza from Nasopharyngeal swab specimens and  should not be used as a sole basis for treatment. Nasal washings and  aspirates are unacceptable for Xpert Xpress SARS-CoV-2/FLU/RSV  testing.  Fact Sheet for Patients: PinkCheek.be  Fact Sheet for Healthcare Providers: GravelBags.it  This test is not yet approved or cleared by the [place] FDA and  has been authorized for detection and/or diagnosis of SARS-CoV-2 by  FDA under an Emergency Use Authorization (EUA). This EUA will remain  in effect (meaning this test can be used) for the duration of the  Covid-19 declaration under Section 564(b)(1) of the Act, 21  U.S.C. section 360bbb-3(b)(1), unless the authorization is  terminated or revoked. Performed at [facility], [address],  [place], [place] [address]   MRSA PCR Screening     Status: None   Collection Time: 10/13/20  2:35 PM   Specimen: Nasopharyngeal  Result Value Ref Range Status   MRSA by PCR NEGATIVE NEGATIVE Final    Comment:        The GeneXpert MRSA Assay (FDA approved for NASAL specimens only), is one component of a comprehensive MRSA colonization surveillance program. It is not intended to diagnose MRSA infection nor to guide or monitor treatment for MRSA infections. Performed at [facility], [address] [place], [place] [address]   C Difficile Quick Screen w PCR reflex     Status: None   Collection Time: 10/14/20  2:15 PM   Specimen: Stool  Result Value Ref Range Status   C Diff antigen NEGATIVE NEGATIVE Final   C Diff toxin NEGATIVE NEGATIVE Final   C Diff interpretation No C. difficile detected.  Final    Comment: Performed at [facility], [address], [place], [place] [address]  Gastrointestinal Panel by PCR , Stool     Status: None   Collection Time: 10/15/20 11:02 AM   Specimen: Stool  Result Value Ref Range Status   Campylobacter species NOT DETECTED NOT DETECTED Final   Plesimonas shigelloides NOT DETECTED NOT DETECTED Final   Salmonella species NOT DETECTED NOT DETECTED Final   Yersinia enterocolitica NOT DETECTED NOT DETECTED Final   Vibrio species NOT DETECTED NOT DETECTED Final   Vibrio cholerae NOT DETECTED NOT DETECTED Final   Enteroaggregative E coli (EAEC) NOT DETECTED NOT DETECTED Final   Enteropathogenic E coli (EPEC) NOT DETECTED NOT DETECTED Final   Enterotoxigenic E coli (ETEC) NOT DETECTED NOT DETECTED Final   Shiga like toxin producing E coli (STEC) NOT DETECTED NOT DETECTED Final   Shigella/Enteroinvasive E coli (EIEC) NOT DETECTED NOT DETECTED Final   Cryptosporidium NOT DETECTED NOT DETECTED Final   Cyclospora cayetanensis NOT DETECTED NOT DETECTED Final   Entamoeba histolytica NOT DETECTED NOT DETECTED Final   Giardia lamblia NOT DETECTED NOT DETECTED  Final   Adenovirus F40/41 NOT DETECTED NOT DETECTED Final   Astrovirus NOT DETECTED NOT DETECTED Final   Norovirus GI/GII NOT DETECTED NOT DETECTED Final   Rotavirus A NOT DETECTED NOT DETECTED Final   Sapovirus (I, II, IV, and V) NOT DETECTED NOT DETECTED Final    Comment: Performed at [facility], [address] [place], [place] [address]    Coagulation Studies: No results for input(s): LABPROT, INR in the last 72 hours.  Urinalysis: No results for input(s): COLORURINE, LABSPEC, PHURINE, GLUCOSEU, HGBUR, BILIRUBINUR, KETONESUR, PROTEINUR, UROBILINOGEN, NITRITE, LEUKOCYTESUR in the last 72 hours.  Invalid input(s): APPERANCEUR    Imaging: No results found.   Medications:   . sodium chloride    . sodium chloride     . (feeding supplement) PROSource Plus  30 mL Oral BID BM  . sodium chloride   Intravenous Once  . amiodarone  200 mg Oral Daily  . calcitRIOL  0.5 mcg Oral Daily  . Chlorhexidine Gluconate Cloth  6 each Topical Q0600  . ciprofloxacin  500 mg Oral Daily  . feeding supplement  237 mL Oral BID BM  . methylPREDNISolone (SOLU-MEDROL) injection  60 mg Intravenous Q24H  . metroNIDAZOLE  500 mg Oral Q8H  . midodrine  10 mg Oral TID with meals  . multivitamin  1 tablet Oral QHS  . simvastatin  20 mg Oral QPM  . sodium chloride flush  3 mL Intravenous Q12H   sodium chloride, sodium chloride, alteplase, alum & mag hydroxide-simeth, diphenoxylate-atropine, heparin, ondansetron (ZOFRAN) IV, prochlorperazine  Assessment/ Plan:  1. Syncope/hypotension- presumably volume depletion due to diarrhea and UF with HD, but despite IVF's and no UF with HD, he had another episode of symptomatic orthostasis. 1. ECHO with preserved LV function and grade I diastolic dysfunction.  No LVH. 2. On midodrine increased to 10 mg 3 times daily 3. Stop fluids, discussed with primary 2. Colitis- GI following and on cipro/flagyl. s/p cscope 11/17: poor prep with incomplete exam,  blood in the rectum and sigmoid colon, in the descending colon and at the splenic flexure; colitis involving descending and sigmoid colons seen.  Biopsies performed.  CMV pending 3. [facility].   Last HD 10/19/2020 with 500 cc removed.  Next dialysis will be 10/22/2020 4. Anemia:of CKD- s/p blood transfusion. Follow h/H, no heparin with HD.  GI on board, cscope findings as above. Transfuse prn 5. 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 Resume calcitriol 0.74mcg daily (was on this as an outpatient). Corrected calcium ~8.9 11/17 6. Nutrition:per primary, push protein 7. H/o Hypertension:see #1 8. Vascular access- will need to f/u with Dr. Donnetta Hutching once stable for discharge. 9. A fib- on amiodarone and eliquis (off A/C as of right now) 10. Hyponatremia.  Noted   LOS: [place] @TODAY @8 :18 AM

## 2020-10-22 ENCOUNTER — Inpatient Hospital Stay (HOSPITAL_COMMUNITY): Payer: Medicare Other

## 2020-10-22 ENCOUNTER — Encounter (HOSPITAL_COMMUNITY): Payer: Self-pay | Admitting: Internal Medicine

## 2020-10-22 DIAGNOSIS — N186 End stage renal disease: Secondary | ICD-10-CM | POA: Diagnosis not present

## 2020-10-22 DIAGNOSIS — D649 Anemia, unspecified: Secondary | ICD-10-CM | POA: Diagnosis not present

## 2020-10-22 DIAGNOSIS — R197 Diarrhea, unspecified: Secondary | ICD-10-CM | POA: Diagnosis not present

## 2020-10-22 DIAGNOSIS — K529 Noninfective gastroenteritis and colitis, unspecified: Secondary | ICD-10-CM | POA: Diagnosis not present

## 2020-10-22 LAB — CBC
HCT: 24.4 % — ABNORMAL LOW (ref 39.0–52.0)
Hemoglobin: 7.7 g/dL — ABNORMAL LOW (ref 13.0–17.0)
MCH: 28.7 pg (ref 26.0–34.0)
MCHC: 31.6 g/dL (ref 30.0–36.0)
MCV: 91 fL (ref 80.0–100.0)
Platelets: 391 10*3/uL (ref 150–400)
RBC: 2.68 MIL/uL — ABNORMAL LOW (ref 4.22–5.81)
RDW: 18.3 % — ABNORMAL HIGH (ref 11.5–15.5)
WBC: 7.2 10*3/uL (ref 4.0–10.5)
nRBC: 0 % (ref 0.0–0.2)

## 2020-10-22 LAB — BASIC METABOLIC PANEL
Anion gap: 10 (ref 5–15)
BUN: 46 mg/dL — ABNORMAL HIGH (ref 8–23)
CO2: 21 mmol/L — ABNORMAL LOW (ref 22–32)
Calcium: 7.7 mg/dL — ABNORMAL LOW (ref 8.9–10.3)
Chloride: 95 mmol/L — ABNORMAL LOW (ref 98–111)
Creatinine, Ser: 4.36 mg/dL — ABNORMAL HIGH (ref 0.61–1.24)
GFR, Estimated: 14 mL/min — ABNORMAL LOW (ref >60)
Glucose, Bld: 232 mg/dL — ABNORMAL HIGH (ref 70–99)
Potassium: 4.3 mmol/L (ref 3.5–5.1)
Sodium: 126 mmol/L — ABNORMAL LOW (ref 135–145)

## 2020-10-22 LAB — PREPARE RBC (CROSSMATCH)

## 2020-10-22 MED ORDER — SODIUM CHLORIDE 0.9% IV SOLUTION: Freq: Once | INTRAVENOUS | Status: DC

## 2020-10-22 NOTE — Progress Notes (Signed)
    Subjective: Notes nausea, burping, belching. Abdomen feels distended. Reports he has not had any flatus in 24 hours. No abdominal pain. Decreased frequency of loose stools. Small amount of blood overnight. Feels better than last week.   Objective: Vital signs in last 24 hours: Temp:  [97.9 F (36.6 C)-98.2 F (36.8 C)] 98.2 F (36.8 C) (11/22 0502) Pulse Rate:  [66-73] 66 (11/22 0502) Resp:  [20] 20 (11/22 0502) BP: (149-155)/(82-87) 149/82 (11/22 0502) SpO2:  [96 %-97 %] 96 % (11/22 1056) Weight:  [85.8 kg] 85.8 kg (11/22 0502) Last BM Date: 10/21/20 General:   Alert and oriented, pleasant Head:  Normocephalic and atraumatic. Eyes:  No icterus, sclera clear. Conjuctiva pink.  Abdomen: Distended but without rigidity or tenderness. +BS, tymapnitic Msk:  Symmetrical without gross deformities. Normal posture.. Extremities:  Without edema. Neurologic:  Alert and  oriented x4 Psych:  Alert and cooperative. Normal mood and affect.  Intake/Output from previous day: 11/21 0701 - 11/22 0700 In: 480 [P.O.:480] Out: 1300 [Urine:1300] Intake/Output this shift: Total I/O In: -  Out: 420 [Urine:420]  Lab Results: Recent Labs    10/20/20 0615 10/21/20 0609 10/22/20 0617  WBC 4.2 6.9 7.2  HGB 8.3* 7.8* 7.7*  HCT 27.3* 25.2* 24.4*  PLT 366 413* 391   BMET Recent Labs    10/20/20 0615 10/22/20 0617  NA 129* 126*  K 3.4* 4.3  CL 98 95*  CO2 23 21*  GLUCOSE 230* 232*  BUN 25* 46*  CREATININE 3.31* 4.36*  CALCIUM 7.3* 7.7*   Assessment: 69 year old pleasant male admitted with several week history of diarrhea with CT findings (without contrast) of colitis, developing rectal bleeding during hospitalization in setting of Eliquis, receiving total of 3 units PRBCs this admission, undergoing colonoscopy this admission with poor prep and incomplete exam. Blood noted in rectum, sigmoid, descending colon, and at splenic flexure. Colonoscopy findings suspicious for segmental or  ischemic colitis. Pathology without changes or features of IBD, query drug-induced or ischemic. CMV stains negative.    Continues on empiric antibiotics and trial of IV steroids empirically started on 11/19. He has had improvement with frequency of diarrhea. However, now with mild abdominal distension, nausea, "burping", and reporting no flatus for 24 hours. Query ileus at this time. Will order abdominal xray.   Normocytic anemia: Hgb 7.7 today, marginally changed from yesterday. Small amount of blood overnight. 1 unit PRBCs ordered by Nephrology to be given during dialysis.    Plan: Continue low residue diet for now Abdominal xray Complete a full 10 day course of Cipro and Flagyl Continue Solu-Medrol and rapidly taper with close follow-up as outpatient Complete colonoscopy as outpatient in future    Annitta Needs, PhD, ANP-BC [facility]    LOS: 9 days    10/22/2020, 12:43 PM

## 2020-10-22 NOTE — Progress Notes (Addendum)
PROGRESS NOTE    [name]  CLE:[id number] DOB: 13-Jul-1951 DOA: 10/12/2020 PCP: [name], No Pcp Per   Chief Complaint  [name] presents with  . Near Syncope   Brief Narrative: 39o yo male with history of diabetes mellitus, CKD stage V started recently on dialysis Monday Wednesday Friday and still making urine, recent A. fib and was restarted on amiodarone metoprolol and Eliquis was sent from [facility] center for new syncopal event and orthostasis.  As per report he was discharged last week from [place], he is on hemodialysis center since Monday, he does report syncope on Monday after dialysis, as well he does report orthostasis and near syncope for last couple days which he was started on midodrine by his nephrologist, report this morning prior to dialysis he felt dizzy and lightheaded where he was hypotensive for which they sent him to ED, [name] report he is still making good amount of urine daily, reports his weight 2 weeks ago was 29.2 kg, and during most dialysis session was at 76,as well he does report diarrhea x2 weeks, he denies fever, chills or abdominal pain, he does report episodes of vomiting on Wednesday, but no recurrence, overall he reports good oral intake and fluid consumption.  [name] was noted to be orthostatic127/67 supine, 71/48 standing with no improvement despite receiving 500 cc x 2 bolus, Significant for potassium of 3.2, sodium of 131, nine 8.2, CT abdomen pelvis significant for wall thickening in the rectal area, with redundant dilated sigmoid colon, but no overt volvulus, so [facility] consulted to admit. Nephrology and GI was consulted. Had acute blood loss aded 2 unit PRBC. And by GI underwent colonoscopy, poor prep were able to obtain biopsy and [name] was monitored closely. [name] was placed on IV steroid with improving diarrhea.  Subjective: [name] was still orthostatic yesterday.  Reports he still feels weak.  Abdomen feels somewhat distended,  nauseous no flatus in 24 hours.   hb low at 7.7 g.  Assessment & Plan:  Near syncopal events with orthostatic hypotension, on midodrine, recurrent syncope with orthostatic hypotension: from intravascular volume depletion blood loss anemia.S/P 2 unit PRBC and IV fluids. Echo w/ preserved LV function and grade 1 diastolic dysfunction.  Again orthostatic 11/21, also anemic transfusing 1 unit PRBC with dialysis today.  He is on midodrine 10 mg 3 times daily already, will add compression stockings.  Monitor orthostatics.    Colitis/rectal bleeding with acute blood loss anemia with recent diarrhea:He has had chronic diarrhea and recent work-up w/ GI panel and C. difficile are negative from 11/14 and 11/15. seen by GI-underwent colonoscopy 11/17-limited exam,biopsy showed severely active chronic nonspecific colitis with ulceration and ascending colon, and  in the sigmoid and descending colon severely active chronic nonspecific colitis with ulceration.complete Cipro Flagyl x10 days, continue systemic steroid as per GI, was started on Solu-Medrol 60 mg daily 11/19.  Somewhat nausea and abdominal distention getting x-ray abdomen rule out ileus.  Monitor clinically.  Appreciate GI inputs on board.  Addendum in PM: reviewed the x-ray finding and discussed with GI, surgery has been consulted for further management  Acute blood loss anemia due to rectal bleeding:s/p 2 units PRBC, hemoglobin 7.7 and [name] is still symptomatic orthostatic discussion nephrology and plan for 1 unit PRBC transfusion and dialysis today Recent Labs  Lab 10/18/20 0445 10/19/20 0548 10/20/20 0615 10/21/20 0609 10/22/20 0617  HGB 7.7* 7.8* 8.3* 7.8* 7.7*  HCT 25.5* 25.2* 27.3* 25.2* 24.4*   ESRD started recently on dialysis MWF,  for dialysis today.per nephro plan is likely not to remove any fluid due to orthostatic hypotension.    Recent paroxysmal A. fib and was started on amiodarone metoprolol and Eliquis:Holding anticoagulations  due to his rectal bleeding and acute blood loss anemia will need GI clearance to resume anticoagulation as outpatient.  Currently on hold given his severe colitis. [name] understands risks and benefits of holding anticoagulation and he seems to be reluctant to start it again-we will need close follow-up with his cardiologist/PCP.  Hyponatremia, sodium is still low 126 continue to adjust dialysis. Hyperlipidemia: Continue statins. Low albumin: Augment nutritional status.. Hypocalcemia: 2/2 low albumin  Nutrition: Diet Order            Diet Carb Modified Fluid consistency: Thin; Room service appropriate? Yes  Diet effective now                  Body mass index is 24.29 kg/m.  Pressure Ulcer: Pressure Injury 10/13/20 Sacrum Circumferential Stage 2 -  Partial thickness loss of dermis presenting as a shallow open injury with a red, pink wound bed without slough. (Active)  10/13/20 1330  Location: Sacrum  Location Orientation: Circumferential  Staging: Stage 2 -  Partial thickness loss of dermis presenting as a shallow open injury with a red, pink wound bed without slough.  Wound Description (Comments):   Present on Admission: Yes    DVT prophylaxis: Place and maintain sequential compression device Start: 10/15/20 1209 Code Status:   Code Status: Full Code  Family Communication: plan of care discussed with [name] at bedside.  Attempted to contact [name] the contact person listed, BUT unable to reach.  Status is: Inpatient Remains inpatient appropriate because:IV treatments appropriate due to intensity of illness or inability to take PO and Inpatient level of care appropriate due to severity of illness  Dispo:  [name] From: Home  Planned Disposition: [place]  Expected discharge date: Hopefully tomorrow after dialysis, will order Covid  swab in am.  Medically stable for discharge: No  Consultants:see note  Procedures:see note  Culture/Microbiology No  results found for: SDES, SPECREQUEST, CULT, REPTSTATUS  Other culture-see note  Medications: Scheduled Meds: . (feeding supplement) PROSource Plus  30 mL Oral BID BM  . sodium chloride   Intravenous Once  . amiodarone  200 mg Oral Daily  . calcitRIOL  0.5 mcg Oral Daily  . Chlorhexidine Gluconate Cloth  6 each Topical Q0600  . Chlorhexidine Gluconate Cloth  6 each Topical Q0600  . ciprofloxacin  500 mg Oral Daily  . feeding supplement  237 mL Oral BID BM  . methylPREDNISolone (SOLU-MEDROL) injection  60 mg Intravenous Q24H  . metroNIDAZOLE  500 mg Oral Q8H  . midodrine  10 mg Oral TID with meals  . multivitamin  1 tablet Oral QHS  . simvastatin  20 mg Oral QPM   Continuous Infusions:   Antimicrobials: Anti-infectives (From admission, onward)   Start     Dose/Rate Route Frequency Ordered Stop   10/13/20 1400  metroNIDAZOLE (FLAGYL) tablet 500 mg        500 mg Oral Every 8 hours 10/13/20 1215     10/13/20 1230  ciprofloxacin (CIPRO) tablet 500 mg        500 mg Oral Daily 10/13/20 1215       Objective: Vitals: Today's Vitals   10/22/20 0200 10/22/20 0502 10/22/20 0750 10/22/20 1056  BP:  (!) 149/82    Pulse:  66    Resp:  20  Temp:  98.2 F (36.8 C)    TempSrc:  Oral    SpO2:  96%  96%  Weight:  85.8 kg    Height:      PainSc: 0-No pain  0-No pain     Intake/Output Summary (Last 24 hours) at 10/22/2020 1438 Last data filed at 10/22/2020 1300 Gross per 24 hour  Intake 240 ml  Output 1620 ml  Net -1380 ml   Filed Weights   10/20/20 0615 10/21/20 0558 10/22/20 0502  Weight: 76.1 kg 76.1 kg 85.8 kg   Weight change: 9.7 kg  Intake/Output from previous day: 11/21 0701 - 11/22 0700 In: 480 [P.O.:480] Out: 1300 [Urine:1300] Intake/Output this shift: Total I/O In: -  Out: 520 [Urine:520]  Examination: General exam: AAO x3, NAD, weak appearing. HEENT:Oral mucosa moist, Ear/Nose WNL grossly, dentition normal. Respiratory system: bilaterally clear,no wheezing  or crackles,no use of accessory muscle Cardiovascular system: S1 & S2 +, No JVD,. Gastrointestinal system: Abdomen full, soft, bowel sounds diminished, mildly tender Nervous System:Alert, awake, moving extremities and grossly nonfocal Extremities: No edema, distal peripheral pulses palpable.  Skin: No rashes,no icterus. MSK: Normal muscle bulk,tone, power  Data Reviewed: I have personally reviewed following labs and imaging studies CBC: Recent Labs  Lab 10/18/20 0445 10/19/20 0548 10/20/20 0615 10/21/20 0609 10/22/20 0617  WBC 5.3 6.7 4.2 6.9 7.2  HGB 7.7* 7.8* 8.3* 7.8* 7.7*  HCT 25.5* 25.2* 27.3* 25.2* 24.4*  MCV 91.7 92.6 92.2 91.6 91.0  PLT 384 416* 366 413* 387   Basic Metabolic Panel: Recent Labs  Lab 10/16/20 0758 10/16/20 0758 10/17/20 0645 10/18/20 0445 10/19/20 0548 10/20/20 0615 10/22/20 0617  NA 130*   < > 130* 132* 130* 129* 126*  K 3.8   < > 3.7 3.7 3.5 3.4* 4.3  CL 99   < > 102 101 101 98 95*  CO2 22   < > 20* 22 21* 23 21*  GLUCOSE 97   < > 97 86 118* 230* 232*  BUN 34*   < > 43* 29* 33* 25* 46*  CREATININE 3.58*   < > 4.21* 2.95* 3.87* 3.31* 4.36*  CALCIUM 6.9*   < > 7.1* 7.2* 7.3* 7.3* 7.7*  MG  --   --  1.5*  --   --   --   --   PHOS 2.5  --  3.2  --   --   --   --    < > = values in this interval not displayed.   GFR: Estimated Creatinine Clearance: 18.3 mL/min (A) (by C-G formula based on SCr of 4.36 mg/dL (H)). Liver Function Tests: Recent Labs  Lab 10/16/20 0758 10/17/20 0645  ALBUMIN 1.7* 1.7*   No results for input(s): LIPASE, AMYLASE in the last 168 hours. No results for input(s): AMMONIA in the last 168 hours. Coagulation Profile: No results for input(s): INR, PROTIME in the last 168 hours. Cardiac Enzymes: No results for input(s): CKTOTAL, CKMB, CKMBINDEX, TROPONINI in the last 168 hours. BNP (last 3 results) No results for input(s): PROBNP in the last 8760 hours. HbA1C: No results for input(s): HGBA1C in the last 72  hours. CBG: No results for input(s): GLUCAP in the last 168 hours. Lipid Profile: No results for input(s): CHOL, HDL, LDLCALC, TRIG, CHOLHDL, LDLDIRECT in the last 72 hours. Thyroid Function Tests: No results for input(s): TSH, T4TOTAL, FREET4, T3FREE, THYROIDAB in the last 72 hours. Anemia Panel: No results for input(s): VITAMINB12, FOLATE, FERRITIN, TIBC, IRON, RETICCTPCT in  the last 72 hours. Sepsis Labs: No results for input(s): PROCALCITON, LATICACIDVEN in the last 168 hours.  Recent Results (from the past 240 hour(s))  Respiratory Panel by RT PCR (Flu A&B, Covid) - Nasopharyngeal Swab     Status: None   Collection Time: 10/12/20  3:13 PM   Specimen: Nasopharyngeal Swab  Result Value Ref Range Status   SARS Coronavirus 2 by RT PCR NEGATIVE NEGATIVE Final    Comment: (NOTE) SARS-CoV-2 target nucleic acids are NOT DETECTED.  The SARS-CoV-2 RNA is generally detectable in upper respiratoy specimens during the acute phase of infection. The lowest concentration of SARS-CoV-2 viral copies this assay can detect is 131 copies/mL. A negative result does not preclude SARS-Cov-2 infection and should not be used as the sole basis for treatment or other [name] management decisions. A negative result may occur with  improper specimen collection/handling, submission of specimen other than nasopharyngeal swab, presence of viral mutation(s) within the areas targeted by this assay, and inadequate number of viral copies (<131 copies/mL). A negative result must be combined with clinical observations, [name] history, and epidemiological information. The expected result is Negative.  Fact Sheet for Patients:  PinkCheek.be  Fact Sheet for Healthcare Providers:  GravelBags.it  This test is no t yet approved or cleared by the [place] FDA and  has been authorized for detection and/or diagnosis of SARS-CoV-2 by FDA under an Emergency  Use Authorization (EUA). This EUA will remain  in effect (meaning this test can be used) for the duration of the COVID-19 declaration under Section 564(b)(1) of the Act, 21 U.S.C. section 360bbb-3(b)(1), unless the authorization is terminated or revoked sooner.     Influenza A by PCR NEGATIVE NEGATIVE Final   Influenza B by PCR NEGATIVE NEGATIVE Final    Comment: (NOTE) The Xpert Xpress SARS-CoV-2/FLU/RSV assay is intended as an aid in  the diagnosis of influenza from Nasopharyngeal swab specimens and  should not be used as a sole basis for treatment. Nasal washings and  aspirates are unacceptable for Xpert Xpress SARS-CoV-2/FLU/RSV  testing.  Fact Sheet for Patients: PinkCheek.be  Fact Sheet for Healthcare Providers: GravelBags.it  This test is not yet approved or cleared by the [place] FDA and  has been authorized for detection and/or diagnosis of SARS-CoV-2 by  FDA under an Emergency Use Authorization (EUA). This EUA will remain  in effect (meaning this test can be used) for the duration of the  Covid-19 declaration under Section 564(b)(1) of the Act, 21  U.S.C. section 360bbb-3(b)(1), unless the authorization is  terminated or revoked. Performed at [facility], [address], [place], [place] [address]   MRSA PCR Screening     Status: None   Collection Time: 10/13/20  2:35 PM   Specimen: Nasopharyngeal  Result Value Ref Range Status   MRSA by PCR NEGATIVE NEGATIVE Final    Comment:        The GeneXpert MRSA Assay (FDA approved for NASAL specimens only), is one component of a comprehensive MRSA colonization surveillance program. It is not intended to diagnose MRSA infection nor to guide or monitor treatment for MRSA infections. Performed at [facility], [address] [place], [place] [address]   C Difficile Quick Screen w PCR reflex     Status: None   Collection Time: 10/14/20  2:15 PM   Specimen:  Stool  Result Value Ref Range Status   C Diff antigen NEGATIVE NEGATIVE Final   C Diff toxin NEGATIVE NEGATIVE Final   C  Diff interpretation No C. difficile detected.  Final    Comment: Performed at [facility], [address], [place], [place] [address]  Gastrointestinal Panel by PCR , Stool     Status: None   Collection Time: 10/15/20 11:02 AM   Specimen: Stool  Result Value Ref Range Status   Campylobacter species NOT DETECTED NOT DETECTED Final   Plesimonas shigelloides NOT DETECTED NOT DETECTED Final   Salmonella species NOT DETECTED NOT DETECTED Final   Yersinia enterocolitica NOT DETECTED NOT DETECTED Final   Vibrio species NOT DETECTED NOT DETECTED Final   Vibrio cholerae NOT DETECTED NOT DETECTED Final   Enteroaggregative E coli (EAEC) NOT DETECTED NOT DETECTED Final   Enteropathogenic E coli (EPEC) NOT DETECTED NOT DETECTED Final   Enterotoxigenic E coli (ETEC) NOT DETECTED NOT DETECTED Final   Shiga like toxin producing E coli (STEC) NOT DETECTED NOT DETECTED Final   Shigella/Enteroinvasive E coli (EIEC) NOT DETECTED NOT DETECTED Final   Cryptosporidium NOT DETECTED NOT DETECTED Final   Cyclospora cayetanensis NOT DETECTED NOT DETECTED Final   Entamoeba histolytica NOT DETECTED NOT DETECTED Final   Giardia lamblia NOT DETECTED NOT DETECTED Final   Adenovirus F40/41 NOT DETECTED NOT DETECTED Final   Astrovirus NOT DETECTED NOT DETECTED Final   Norovirus GI/GII NOT DETECTED NOT DETECTED Final   Rotavirus A NOT DETECTED NOT DETECTED Final   Sapovirus (I, II, IV, and V) NOT DETECTED NOT DETECTED Final    Comment: Performed at [facility], [address] [place], [place] [address]    Radiology Studies: DG Abd Portable 2V  Result Date: 10/22/2020 CLINICAL DATA:  Abdominal distension.  History of bowel obstruction. EXAM: PORTABLE ABDOMEN - 2 VIEW COMPARISON:  CT 10/12/2020.  Radiography 09/24/2020. FINDINGS: Gaseous distension of the intestine. Transverse colon  measures up to 12 cm in diameter. Moderate stent shin of the small intestine as well, maximal diameter 3.5 cm. Moderate amount of fecal matter in the right colon. No sign of free air. The appearance could be due to pseudo obstruction, ileus or actual distal colon obstruction. Gaseous distension appears even more pronounced than on the study of 10 days ago. IMPRESSION: Gaseous distension of the intestine, even more pronounced than on the study of 10 days ago. The appearance could be due to pseudo obstruction, ileus or actual distal colon obstruction. Electronically Signed   By: Nelson Chimes M.D.   On: 10/22/2020 13:55     LOS: 9 days   Antonieta Pert, MD [facility]  10/22/2020, 2:38 PM

## 2020-10-22 NOTE — Procedures (Signed)
   HEMODIALYSIS TREATMENT NOTE:  3.5 hour heparin-free HD completed via RIJ TDC.  Kept even / no fluid removed as he had another episode of orthostasis this morning with PT.    One unit pRBC transfused with HD.  All blood was returned.  Rockwell [place], RN

## 2020-10-22 NOTE — Progress Notes (Signed)
Physical Therapy Treatment Patient Details Name: [name] MRN: [id number] DOB: June 02, 1951 Today's Date: 10/22/2020    History of Present Illness [name]  is a 68 y.o. male, with past medical history of diabetes mellitus, CKD stage V, recent hospitalization at [facility] for which he started hemodialysis for ESRD on MWF schedule, and diagnosed with A. fib when he started on amiodarone, metoprolol and Eliquis, patient was sent from hemodialysis center for near syncope and orthostasis, patient reports he was discharged last week from [place], he is on hemodialysis center since Monday, he does report syncope on Monday after dialysis, as well he does report orthostasis and near syncope for last couple days which he was started on midodrine by his nephrologist, report this morning prior to dialysis he felt dizzy and lightheaded where he was hypotensive for which they sent him to ED, patient report he is still making good amount of urine daily, reports his weight 2 weeks ago was 29.2 kg, and during most dialysis session was at 76, as well he does report diarrhea x2 weeks, he denies fever, chills or abdominal pain, he does report episodes of vomiting on Wednesday, but no recurrence, overall he reports good oral intake and fluid consumption.    PT Comments    Patient engaged in functional mobility activities today requiring increased need for physical assistance due to generalized weakness and decreased activity tolerance due to symptomatic orthostatic hypotension requiring prolonged and frequent rest periods between movements and position changes.  Patient remains motivated to participate and improve mobility and activity tolerance.  Further treatment indicated to increase strength and functional activity tolerance for OOB activity.   Follow Up Recommendations  SNF     Equipment Recommendations  Rolling walker with 5" wheels    Recommendations for Other Services       Precautions /  Restrictions Precautions Precautions: Fall Restrictions Weight Bearing Restrictions: No    Mobility  Bed Mobility Overal bed mobility: Needs Assistance Bed Mobility: Supine to Sit;Sit to Supine     Supine to sit: Min assist Sit to supine: Min guard   General bed mobility comments: labored movement, increased time and frequent rest periods needed  Transfers Overall transfer level: Needs assistance Equipment used: Rolling walker (2 wheeled)   Sit to Stand: Max assist Stand pivot transfers: From elevated surface       General transfer comment: unsteady on feet due to BLE weakness, c/o lighheadedness and symptomatic orthostatic hypotension  Ambulation/Gait                 Stairs             Wheelchair Mobility    Modified Rankin (Stroke Patients Only)       Balance                                            Cognition Arousal/Alertness: Awake/alert Behavior During Therapy: WFL for tasks assessed/performed Overall Cognitive Status: Within Functional Limits for tasks assessed                                        Exercises General Exercises - Lower Extremity Ankle Circles/Pumps: AROM;Both;20 reps Quad Sets: Strengthening;Both;20 reps Gluteal Sets: Strengthening;Both;20 reps Heel Slides: Strengthening;Both;20 reps Hip ABduction/ADduction: Strengthening;Both;20 reps  General Comments        Pertinent Vitals/Pain Pain Assessment: No/denies pain    Home Living                      Prior Function            PT Goals (current goals can now be found in the care plan section) Acute Rehab PT Goals Patient Stated Goal: return home after rehab PT Goal Formulation: With patient Time For Goal Achievement: 10/29/20 Potential to Achieve Goals: Good Progress towards PT goals: Progressing toward goals (limited due to medical condition(s) and symptoms)    Frequency    Min 3X/week      PT Plan  Current plan remains appropriate    Co-evaluation              AM-PAC PT "6 Clicks" Mobility   Outcome Measure  Help needed turning from your back to your side while in a flat bed without using bedrails?: A Little Help needed moving from lying on your back to sitting on the side of a flat bed without using bedrails?: A Little Help needed moving to and from a bed to a chair (including a wheelchair)?: A Little Help needed standing up from a chair using your arms (e.g., wheelchair or bedside chair)?: A Lot Help needed to walk in hospital room?: A Lot Help needed climbing 3-5 steps with a railing? : Total 6 Click Score: 14    End of Session Equipment Utilized During Treatment: Gait belt Activity Tolerance: Patient limited by fatigue;Treatment limited secondary to medical complications (Comment) (no OOB to chair due to BP issues) Patient left: in bed;with call bell/phone within reach;with bed alarm set;with SCD's reapplied Nurse Communication: Mobility status PT Visit Diagnosis: Unsteadiness on feet (R26.81);Other abnormalities of gait and mobility (R26.89);Muscle weakness (generalized) (M62.81)     Time: 1657-9038 PT Time Calculation (min) (ACUTE ONLY): 30 min  Charges:  $Therapeutic Exercise: 8-22 mins $Therapeutic Activity: 8-22 mins                    11:04 AM, 10/22/20 M. Sherlyn Lees, PT, DPT Physical Therapist- [place] Office Number: [phone]

## 2020-10-22 NOTE — Progress Notes (Signed)
Patient ID: [name], male   DOB: 09/11/1951, 69 y.o.   MRN: [id number]    S:   Remains orthostatic  NO LEE present, NO SOB  Hb stable 7.7  Midodrine 10mg  TID  O:BP (!) 149/82 (BP Location: Right Arm)   Pulse 66   Temp 98.2 F (36.8 C) (Oral)   Resp 20   Ht 6\' 2"  (1.88 m)   Wt 85.8 kg   SpO2 96%   BMI 24.29 kg/m   Intake/Output Summary (Last 24 hours) at 10/22/2020 0849 Last data filed at 10/22/2020 0500 Gross per 24 hour  Intake 480 ml  Output 1300 ml  Net -820 ml   Intake/Output: I/O last 3 completed shifts: In: 720 [P.O.:720] Out: 1900 [Urine:1900]  Intake/Output this shift:  No intake/output data recorded. Weight change: 9.7 kg   Gen: NAD CVS: RRR, no rub Resp: cta bl Abd: +BS, soft, Nt/ND Ext: 1+ edema of bilateral ankles Neuro: speech clear and coherent, moves all ext spontaneously Access: [facility] c/d/i  Recent Labs  Lab 10/16/20 0758 10/17/20 0645 10/18/20 0445 10/19/20 0548 10/20/20 0615 10/22/20 0617  NA 130* 130* 132* 130* 129* 126*  K 3.8 3.7 3.7 3.5 3.4* 4.3  CL 99 102 101 101 98 95*  CO2 22 20* 22 21* 23 21*  GLUCOSE 97 97 86 118* 230* 232*  BUN 34* 43* 29* 33* 25* 46*  CREATININE 3.58* 4.21* 2.95* 3.87* 3.31* 4.36*  ALBUMIN 1.7* 1.7*  --   --   --   --   CALCIUM 6.9* 7.1* 7.2* 7.3* 7.3* 7.7*  PHOS 2.5 3.2  --   --   --   --    Liver Function Tests: Recent Labs  Lab 10/16/20 0758 10/17/20 0645  ALBUMIN 1.7* 1.7*   No results for input(s): LIPASE, AMYLASE in the last 168 hours. No results for input(s): AMMONIA in the last 168 hours. CBC: Recent Labs  Lab 10/18/20 0445 10/18/20 0445 10/19/20 0548 10/19/20 0548 10/20/20 0615 10/21/20 0609 10/22/20 0617  WBC 5.3   < > 6.7   < > 4.2 6.9 7.2  HGB 7.7*   < > 7.8*   < > 8.3* 7.8* 7.7*  HCT 25.5*   < > 25.2*   < > 27.3* 25.2* 24.4*  MCV 91.7  --  92.6  --  92.2 91.6 91.0  PLT 384   < > 416*   < > 366 413* 391   < > = values in this interval not displayed.   Cardiac  Enzymes: No results for input(s): CKTOTAL, CKMB, CKMBINDEX, TROPONINI in the last 168 hours. CBG: No results for input(s): GLUCAP in the last 168 hours.  Iron Studies:  No results for input(s): IRON, TIBC, TRANSFERRIN, FERRITIN in the last 72 hours. Studies/Results: No results found. . (feeding supplement) PROSource Plus  30 mL Oral BID BM  . sodium chloride   Intravenous Once  . amiodarone  200 mg Oral Daily  . calcitRIOL  0.5 mcg Oral Daily  . Chlorhexidine Gluconate Cloth  6 each Topical Q0600  . Chlorhexidine Gluconate Cloth  6 each Topical Q0600  . ciprofloxacin  500 mg Oral Daily  . feeding supplement  237 mL Oral BID BM  . methylPREDNISolone (SOLU-MEDROL) injection  60 mg Intravenous Q24H  . metroNIDAZOLE  500 mg Oral Q8H  . midodrine  10 mg Oral TID with meals  . multivitamin  1 tablet Oral QHS  . simvastatin  20 mg Oral QPM  .  sodium chloride flush  3 mL Intravenous Q12H    BMET    Component Value Date/Time   NA 126 (L) 10/22/2020 0617   K 4.3 10/22/2020 0617   CL 95 (L) 10/22/2020 0617   CO2 21 (L) 10/22/2020 0617   GLUCOSE 232 (H) 10/22/2020 0617   BUN 46 (H) 10/22/2020 0617   CREATININE 4.36 (H) 10/22/2020 0617   CALCIUM 7.7 (L) 10/22/2020 0617   GFRNONAA 14 (L) 10/22/2020 0617   GFRAA 19 (L) 06/15/2018 0407   CBC    Component Value Date/Time   WBC 7.2 10/22/2020 0617   RBC 2.68 (L) 10/22/2020 0617   HGB 7.7 (L) 10/22/2020 0617   HCT 24.4 (L) 10/22/2020 0617   PLT 391 10/22/2020 0617   MCV 91.0 10/22/2020 0617   MCH 28.7 10/22/2020 0617   MCHC 31.6 10/22/2020 0617   RDW 18.3 (H) 10/22/2020 0617   LYMPHSABS 0.6 (L) 10/05/2020 1850   MONOABS 1.2 (H) 10/05/2020 1850   EOSABS 0.0 10/05/2020 1850   BASOSABS 0.0 10/05/2020 1850     Assessment/Plan:  1. Syncope/hypotension-  1. Presumably vol depletion and ABLA;  On TID Midodrine.  HD today with no UF.  2. Colitis- GI following and on cipro/flagyl.  s/p cscope 11/17: poor prep with incomplete exam,  blood in the rectum and sigmoid colon, in the descending colon and at the splenic flexure; colitis involving descending and sigmoid colons seen.  Biopsies performed. Per GI 3. ESRD recent start on MWF at [facility].  HD today, no UF 4. Anemia: of CKD- s/p blood transfusion.  No heparin with HD.  GI on board, cscope findings as above. Transfuse 1 u with HD today 5. 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  Resume calcitriol 0.39mcg daily (was on this as an outpatient). Calcium corrects 6. Nutrition: per primary, push protein 7. H/o Hypertension: see #1 8. Vascular access- will need to f/u with Dr. Donnetta Hutching once stable for discharge. 9. A fib- on amiodarone and eliquis (off A/C as of right now) 10. Hyponatremia.  Monitor  Rexene Agent  [name]

## 2020-10-22 NOTE — TOC Progression Note (Signed)
Transition of Care [facility]) - Progression Note   Patient Details  Name: [name] MRN: [id number] Date of Birth: 1951/02/27  Transition of Care [facility]) CM/SW [place], LCSW Phone Number: 10/22/2020, 11:50 AM  Clinical Narrative: Patient will likely not be discharging today. CSW spoke with Mardene Celeste in admissions at [facility]. Per Shireen Quan will only be able to hold the patient's bed until 10/24/20. Hospitalist updated. TOC to follow.  Expected Discharge Plan: [place] Barriers to Discharge: Continued Medical Work up  Expected Discharge Plan and Services Expected Discharge Plan: [place] arrangements for the past 2 months: Single Family Home  Readmission Risk Interventions No flowsheet data found.

## 2020-10-23 DIAGNOSIS — K529 Noninfective gastroenteritis and colitis, unspecified: Secondary | ICD-10-CM | POA: Diagnosis not present

## 2020-10-23 DIAGNOSIS — D649 Anemia, unspecified: Secondary | ICD-10-CM | POA: Diagnosis not present

## 2020-10-23 DIAGNOSIS — N186 End stage renal disease: Secondary | ICD-10-CM | POA: Diagnosis not present

## 2020-10-23 LAB — CBC
HCT: 27.6 % — ABNORMAL LOW (ref 39.0–52.0)
Hemoglobin: 8.7 g/dL — ABNORMAL LOW (ref 13.0–17.0)
MCH: 28.6 pg (ref 26.0–34.0)
MCHC: 31.5 g/dL (ref 30.0–36.0)
MCV: 90.8 fL (ref 80.0–100.0)
Platelets: 307 10*3/uL (ref 150–400)
RBC: 3.04 MIL/uL — ABNORMAL LOW (ref 4.22–5.81)
RDW: 17.9 % — ABNORMAL HIGH (ref 11.5–15.5)
WBC: 9.2 10*3/uL (ref 4.0–10.5)
nRBC: 0 % (ref 0.0–0.2)

## 2020-10-23 LAB — TYPE AND SCREEN
ABO/RH(D): A POS
Antibody Screen: NEGATIVE
Unit division: 0

## 2020-10-23 LAB — BASIC METABOLIC PANEL
Anion gap: 8 (ref 5–15)
BUN: 32 mg/dL — ABNORMAL HIGH (ref 8–23)
CO2: 25 mmol/L (ref 22–32)
Calcium: 7.6 mg/dL — ABNORMAL LOW (ref 8.9–10.3)
Chloride: 96 mmol/L — ABNORMAL LOW (ref 98–111)
Creatinine, Ser: 3.12 mg/dL — ABNORMAL HIGH (ref 0.61–1.24)
GFR, Estimated: 21 mL/min — ABNORMAL LOW (ref >60)
Glucose, Bld: 184 mg/dL — ABNORMAL HIGH (ref 70–99)
Potassium: 4.3 mmol/L (ref 3.5–5.1)
Sodium: 129 mmol/L — ABNORMAL LOW (ref 135–145)

## 2020-10-23 LAB — BPAM RBC
Blood Product Expiration Date: 202112062359
ISSUE DATE / TIME: 202111221757
Unit Type and Rh: 6200

## 2020-10-23 NOTE — Progress Notes (Signed)
Patient ID: CHRISOTPHER RIVERO, male   DOB: 1951-10-08, 69 y.o.   MRN: [id number]    S:   Remains orthostatic this AM  HD yest no UF, given 1u PRBC; Hb 7.7 to 8.7  Worsening ileus / colitis; rpt imaging yesterdya, surgery to see; but says passing gas this AM  Midodrine 10mg  TID  O:BP (!) 135/92 (BP Location: Right Arm)   Pulse 62   Temp 98.5 F (36.9 C) (Oral)   Resp 20   Ht 6\' 2"  (1.88 m)   Wt 85 kg   SpO2 96%   BMI 24.06 kg/m   Intake/Output Summary (Last 24 hours) at 10/23/2020 0825 Last data filed at 10/22/2020 1950 Gross per 24 hour  Intake 795 ml  Output 610 ml  Net 185 ml   Intake/Output: I/O last 3 completed shifts: In: 795 [P.O.:480; Blood:315] Out: 4401 [Urine:1510]  Intake/Output this shift:  No intake/output data recorded. Weight change: 0 kg   Gen: NAD CVS: RRR, no rub Resp: cta bl Abd: +BS, soft, Nt/ND Ext: No sig edema of bilateral ankles Neuro: speech clear and coherent, moves all ext spontaneously Access: [facility] c/d/i  Recent Labs  Lab 10/17/20 0645 10/18/20 0445 10/19/20 0548 10/20/20 0615 10/22/20 0617 10/23/20 0507  NA 130* 132* 130* 129* 126* 129*  K 3.7 3.7 3.5 3.4* 4.3 4.3  CL 102 101 101 98 95* 96*  CO2 20* 22 21* 23 21* 25  GLUCOSE 97 86 118* 230* 232* 184*  BUN 43* 29* 33* 25* 46* 32*  CREATININE 4.21* 2.95* 3.87* 3.31* 4.36* 3.12*  ALBUMIN 1.7*  --   --   --   --   --   CALCIUM 7.1* 7.2* 7.3* 7.3* 7.7* 7.6*  PHOS 3.2  --   --   --   --   --    Liver Function Tests: Recent Labs  Lab 10/17/20 0645  ALBUMIN 1.7*   No results for input(s): LIPASE, AMYLASE in the last 168 hours. No results for input(s): AMMONIA in the last 168 hours. CBC: Recent Labs  Lab 10/19/20 0548 10/19/20 0548 10/20/20 0615 10/20/20 0615 10/21/20 0609 10/22/20 0617 10/23/20 0507  WBC 6.7   < > 4.2   < > 6.9 7.2 9.2  HGB 7.8*   < > 8.3*   < > 7.8* 7.7* 8.7*  HCT 25.2*   < > 27.3*   < > 25.2* 24.4* 27.6*  MCV 92.6  --  92.2  --  91.6 91.0 90.8   PLT 416*   < > 366   < > 413* 391 307   < > = values in this interval not displayed.   Cardiac Enzymes: No results for input(s): CKTOTAL, CKMB, CKMBINDEX, TROPONINI in the last 168 hours. CBG: No results for input(s): GLUCAP in the last 168 hours.  Iron Studies:  No results for input(s): IRON, TIBC, TRANSFERRIN, FERRITIN in the last 72 hours. Studies/Results: CT ABDOMEN PELVIS WO CONTRAST  Result Date: 10/23/2020 CLINICAL DATA:  Abdominal distension with gaseous distention of bowel on abdominal radiographs. Elevated creatinine. EXAM: CT ABDOMEN AND PELVIS WITHOUT CONTRAST TECHNIQUE: Multidetector CT imaging of the abdomen and pelvis was performed following the standard protocol without IV contrast. COMPARISON:  10/12/2020 FINDINGS: Lower chest: Small bilateral pleural effusions with bilateral basilar atelectasis. Effusions are increasing in size since previous study. Fluid in the distal esophagus suggesting reflux. Hepatobiliary: Normal appearance of the liver. The gallbladder is distended with multiple small stones present. No bile duct dilatation.  Pancreas: Unremarkable. No pancreatic ductal dilatation or surrounding inflammatory changes. Spleen: Normal in size without focal abnormality. Adrenals/Urinary Tract: No adrenal gland nodules. Renal parenchyma is atrophic bilaterally. Multiple renal parenchymal calcifications, largest on the left measuring 5 mm in diameter. No hydronephrosis or hydroureter. No ureteral stones are identified. The bladder is normal. Stomach/Bowel: There is prominent diffuse distention of the colon throughout. Stool is demonstrated in the right hemicolon. Small amount of fluid in the descending colon. Rectal contrast material is present with a rectal tube in place. No wall thickening or inflammatory changes. The small bowel are decompressed. Moderately distended stomach with air-fluid levels. Vascular/Lymphatic: Aortic atherosclerosis. No enlarged abdominal or pelvic lymph  nodes. Reproductive: Prostate is unremarkable. Other: No free air or free fluid in the abdomen. Abdominal wall musculature appears intact. Musculoskeletal: Degenerative changes in the spine and hips. No destructive bone lesions. IMPRESSION: 1. Prominent diffuse distention of the colon throughout with air-fluid levels. No evidence of small bowel obstruction. No inflammatory changes or obstructing lesion identified. Changes likely to represent adynamic ileus. 2. Small bilateral pleural effusions with bilateral basilar atelectasis. Effusions are increasing in size since previous study. 3. Fluid in the distal esophagus suggesting reflux. 4. Cholelithiasis with distended gallbladder. 5. Bilateral renal atrophy with multiple renal parenchymal calcifications. No hydronephrosis or hydroureter. 6. Aortic atherosclerosis. Aortic Atherosclerosis (ICD10-I70.0). Electronically Signed   By: Lucienne Capers M.D.   On: 10/23/2020 00:15   DG Abd Portable 2V  Result Date: 10/22/2020 CLINICAL DATA:  Abdominal distension.  History of bowel obstruction. EXAM: PORTABLE ABDOMEN - 2 VIEW COMPARISON:  CT 10/12/2020.  Radiography 09/24/2020. FINDINGS: Gaseous distension of the intestine. Transverse colon measures up to 12 cm in diameter. Moderate stent shin of the small intestine as well, maximal diameter 3.5 cm. Moderate amount of fecal matter in the right colon. No sign of free air. The appearance could be due to pseudo obstruction, ileus or actual distal colon obstruction. Gaseous distension appears even more pronounced than on the study of 10 days ago. IMPRESSION: Gaseous distension of the intestine, even more pronounced than on the study of 10 days ago. The appearance could be due to pseudo obstruction, ileus or actual distal colon obstruction. Electronically Signed   By: Nelson Chimes M.D.   On: 10/22/2020 13:55   . (feeding supplement) PROSource Plus  30 mL Oral BID BM  . sodium chloride   Intravenous Once  . amiodarone  200  mg Oral Daily  . calcitRIOL  0.5 mcg Oral Daily  . Chlorhexidine Gluconate Cloth  6 each Topical Q0600  . Chlorhexidine Gluconate Cloth  6 each Topical Q0600  . feeding supplement  237 mL Oral BID BM  . methylPREDNISolone (SOLU-MEDROL) injection  60 mg Intravenous Q24H  . midodrine  10 mg Oral TID with meals  . multivitamin  1 tablet Oral QHS  . simvastatin  20 mg Oral QPM    BMET    Component Value Date/Time   NA 129 (L) 10/23/2020 0507   K 4.3 10/23/2020 0507   CL 96 (L) 10/23/2020 0507   CO2 25 10/23/2020 0507   GLUCOSE 184 (H) 10/23/2020 0507   BUN 32 (H) 10/23/2020 0507   CREATININE 3.12 (H) 10/23/2020 0507   CALCIUM 7.6 (L) 10/23/2020 0507   GFRNONAA 21 (L) 10/23/2020 0507   GFRAA 19 (L) 06/15/2018 0407   CBC    Component Value Date/Time   WBC 9.2 10/23/2020 0507   RBC 3.04 (L) 10/23/2020 0507   HGB 8.7 (L)  10/23/2020 0507   HCT 27.6 (L) 10/23/2020 0507   PLT 307 10/23/2020 0507   MCV 90.8 10/23/2020 0507   MCH 28.6 10/23/2020 0507   MCHC 31.5 10/23/2020 0507   RDW 17.9 (H) 10/23/2020 0507   LYMPHSABS 0.6 (L) 10/05/2020 1850   MONOABS 1.2 (H) 10/05/2020 1850   EOSABS 0.0 10/05/2020 1850   BASOSABS 0.0 10/05/2020 1850     Assessment/Plan:  1. Syncope/hypotension; sig orthostasis: On TID Midodrine.  HD with minimal UF, prob exacerbated by #2 2. Colitis- GI following and on cipro/flagyl.  s/p cscope 11/17: poor prep with incomplete exam, blood in the rectum and sigmoid colon, in the descending colon and at the splenic flexure; colitis involving descending and sigmoid colons seen.  Biopsies performed. Per GI and surgery 3. ESRD recent start on MWF at [facility].  HD on schedule cont with no UF 4. Anemia: of CKD- s/p blood transfusion.  No heparin with HD.  GI on board, cscope findings as above. Rec 1u prbc 11/22 5. 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  Calcitriol 0.66mcg daily (was on this as an outpatient). Calcium corrects 6. Nutrition: per primary, push  protein 7. H/o Hypertension: see #1 8. Vascular access- will need to f/u with Dr. Donnetta Hutching once stable for discharge. 9. A fib- on amiodarone and eliquis (off A/C as of right now) 10. Hyponatremia.  Monitor  Rexene Agent  [name]

## 2020-10-23 NOTE — Progress Notes (Signed)
   10/23/20 0810  Orthostatic Lying   BP- Lying 135/85  Pulse- Lying 65  Orthostatic Sitting  BP- Sitting 91/68  Pulse- Sitting 100  Orthostatic Standing at 0 minutes  BP- Standing at 0 minutes (!) 80/53  Pulse- Standing at 0 minutes 108

## 2020-10-23 NOTE — Progress Notes (Signed)
PROGRESS NOTE    [name]  ZOX:[id number] DOB: 12-13-1950 DOA: 10/12/2020 PCP: Patient, No Pcp Per   Chief Complaint  Patient presents with  . Near Syncope   Brief Narrative: 75o yo male with history of diabetes mellitus, CKD stage V started recently on dialysis Monday Wednesday Friday and still making urine, recent A. fib and was restarted on amiodarone metoprolol and Eliquis was sent from [facility] center for new syncopal event and orthostasis.  As per report he was discharged last week from [place], he is on hemodialysis center since Monday, he does report syncope on Monday after dialysis, as well he does report orthostasis and near syncope for last couple days which he was started on midodrine by his nephrologist, report this morning prior to dialysis he felt dizzy and lightheaded where he was hypotensive for which they sent him to ED, patient report he is still making good amount of urine daily, reports his weight 2 weeks ago was 29.2 kg, and during most dialysis session was at 76,as well he does report diarrhea x2 weeks, he denies fever, chills or abdominal pain, he does report episodes of vomiting on Wednesday, but no recurrence, overall he reports good oral intake and fluid consumption.  Patient was noted to be orthostatic127/67 supine, 71/48 standing with no improvement despite receiving 500 cc x 2 bolus, Significant for potassium of 3.2, sodium of 131, nine 8.2, CT abdomen pelvis significant for wall thickening in the rectal area, with redundant dilated sigmoid colon, but no overt volvulus, so [facility] consulted to admit. Nephrology and GI was consulted. Had acute blood loss aded 2 unit PRBC. And by GI underwent colonoscopy, poor prep were able to obtain biopsy and patient was monitored closely. Patient was placed on IV steroid with improving diarrhea/rectal bleeding.  Subjective: Patient was still orthostatic yesterday and this am down in 8os from 13os on standing, but not  so much symptomatic, passed lot sof gas and abd feels softer  And trying ot eat, no nausea or vomiting. Again orthostatic as blood pressure dropped from 130s to 80s on standing this morning but he was not as symptomatic as before.  Assessment & Plan:  Near syncopal events with orthostatic hypotension, on midodrine, recurrent syncope with orthostatic hypotension: from intravascular volume depletion,blood loss anemia.S/P 3 unit PRBC and IV fluids, Echo w/ preserved LV function and grade 1 diastolic dysfunction.  Patient was still orthostatic so received 1 unit PRBC 11/22 with dialysis, no fluid removed with dialysis, his midodrine has been maximized to 10 mg TID. Add knee stocking 2o t o3o mm hg.Monitor orthostatic vitals.   Colitis/rectal bleeding with acute blood loss anemia with recent diarrhea:He has had chronic diarrhea and recent work-up w/ GI panel and C. difficile are negative from 11/14 and 11/15. Seen by GI-underwent colonoscopy 11/17-limited exam,biopsy showed severely active chronic nonspecific colitis with ulceration and ascending colon, and  in the sigmoid and descending colon severely active chronic nonspecific colitis with ulceration. Completing Cipro/Flagyl x10 days today. Cont Steroid as per GI, was started on Solu-Medrol 60 mg daily 11/19.  Adynamic ileus CT scan 11/22 shows diffuse distention of the colon throughout with air-fluid level, no small bowel obstruction, no infiltrative changes or obstructive lesion identified.  GI and surgery following continue conservative management and feels improving today.  Acute blood loss anemia due to rectal bleeding:s/p 2 units PRBC, hemoglobin in 7.7 and orthostattic- got 1 unit PRBC 11/22 with dialysis.  Hemoglobin is improved.  Monitor.   Recent Labs  Lab 10/19/20 0548 10/20/20 0615 10/21/20 0609 10/22/20 0617 10/23/20 0507  HGB 7.8* 8.3* 7.8* 7.7* 8.7*  HCT 25.2* 27.3* 25.2* 24.4* 27.6*   ESRD started recently on dialysis MWF, had  dialysis 11/22 and no fluid removed due to orthostasis.    Recent paroxysmal A. fib and was started on amiodarone metoprolol and Eliquis:Holding anticoagulations due to his rectal bleeding and acute blood loss anemia will need GI clearance to resume anticoagulation as outpatient.  Currently on hold given his severe colitis. Patient understands risks and benefits of holding anticoagulation and he seems to be reluctant to start it again-we will need close follow-up with his cardiologist/PCP.  Hyponatremia, sodium is low continue to adjust dialysis.  Hyperlipidemia: Continue statins. Low albumin: Augment nutritional status.. Hypocalcemia: 2/2 low albumin  Nutrition: Diet Order            Diet Carb Modified Fluid consistency: Thin; Room service appropriate? Yes  Diet effective now                  Body mass index is 24.06 kg/m.  Pressure Ulcer: Pressure Injury 10/13/20 Sacrum Circumferential Stage 2 -  Partial thickness loss of dermis presenting as a shallow open injury with a red, pink wound bed without slough. (Active)  10/13/20 1330  Location: Sacrum  Location Orientation: Circumferential  Staging: Stage 2 -  Partial thickness loss of dermis presenting as a shallow open injury with a red, pink wound bed without slough.  Wound Description (Comments):   Present on Admission: Yes    DVT prophylaxis: Place and maintain sequential compression device Start: 10/15/20 1209 Code Status:   Code Status: Full Code  Family Communication: plan of care discussed with patient at bedside.  I had attempted to contact [name] the contact person listed, BUT unable to reach.  Status is: Inpatient Remains inpatient appropriate because:IV treatments appropriate due to intensity of illness or inability to take PO and Inpatient level of care appropriate due to severity of illness  Dispo:  Patient From: Home  Planned Disposition: [place]  Expected discharge date: In next 1 to 2  days once ileus /orthostatics improves  Medically stable for discharge: No  Consultants:see note  Procedures:  CT abdomen pelvis without iv contrast, w/ rectal contrast  1. Prominent diffuse distention of the colon throughout with air-fluid levels. No evidence of small bowel obstruction. No inflammatory changes or obstructing lesion identified. Changes likely to represent adynamic ileus. 2. Small bilateral pleural effusions with bilateral basilar atelectasis. Effusions are increasing in size since previous study. 3. Fluid in the distal esophagus suggesting reflux. 4. Cholelithiasis with distended gallbladder. 5. Bilateral renal atrophy with multiple renal parenchymal calcifications. No hydronephrosis or hydroureter.  Culture/Microbiology No results found for: SDES, SPECREQUEST, CULT, REPTSTATUS  Other culture-see note  Medications: Scheduled Meds: . (feeding supplement) PROSource Plus  30 mL Oral BID BM  . sodium chloride   Intravenous Once  . amiodarone  200 mg Oral Daily  . calcitRIOL  0.5 mcg Oral Daily  . Chlorhexidine Gluconate Cloth  6 each Topical Q0600  . Chlorhexidine Gluconate Cloth  6 each Topical Q0600  . ciprofloxacin  500 mg Oral Daily  . feeding supplement  237 mL Oral BID BM  . methylPREDNISolone (SOLU-MEDROL) injection  60 mg Intravenous Q24H  . metroNIDAZOLE  500 mg Oral Q8H  . midodrine  10 mg Oral TID with meals  . multivitamin  1 tablet Oral QHS  . simvastatin  20 mg Oral QPM  Continuous Infusions:   Antimicrobials: Anti-infectives (From admission, onward)   Start     Dose/Rate Route Frequency Ordered Stop   10/13/20 1400  metroNIDAZOLE (FLAGYL) tablet 500 mg        500 mg Oral Every 8 hours 10/13/20 1215     10/13/20 1230  ciprofloxacin (CIPRO) tablet 500 mg        500 mg Oral Daily 10/13/20 1215       Objective: Vitals: Today's Vitals   10/22/20 2000 10/22/20 2110 10/23/20 0500 10/23/20 0502  BP: (!) 157/82 (!) 170/101  (!) 135/92   Pulse: 72 70  62  Resp: 16 20  20   Temp: 98.1 F (36.7 C) 98.4 F (36.9 C)  98.5 F (36.9 C)  TempSrc: Oral Oral  Oral  SpO2:  98%  96%  Weight:   85 kg   Height:      PainSc:        Intake/Output Summary (Last 24 hours) at 10/23/2020 0737 Last data filed at 10/22/2020 1950 Gross per 24 hour  Intake 795 ml  Output 610 ml  Net 185 ml   Filed Weights   10/22/20 0502 10/22/20 1600 10/23/20 0500  Weight: 85.8 kg 85.8 kg 85 kg   Weight change: 0 kg  Intake/Output from previous day: 11/22 0701 - 11/23 0700 In: 795 [P.O.:480; Blood:315] Out: 810 [Urine:810] Intake/Output this shift: No intake/output data recorded.  Examination: General exam: AAOx3 , NAD, weak appearing. HEENT:Oral mucosa moist, Ear/Nose WNL grossly, dentition normal. Respiratory system: bilaterally clear,no wheezing or crackles,no use of accessory muscle Cardiovascular system: S1 & S2 +, No JVD,. Gastrointestinal system: Abdomen soft, mildly distended BS+ Nervous System:Alert, awake, moving extremities and grossly nonfocal Extremities: No edema, distal peripheral pulses palpable.  Skin: No rashes,no icterus. MSK: Normal muscle bulk,tone, power  Data Reviewed: I have personally reviewed following labs and imaging studies CBC: Recent Labs  Lab 10/19/20 0548 10/20/20 0615 10/21/20 0609 10/22/20 0617 10/23/20 0507  WBC 6.7 4.2 6.9 7.2 9.2  HGB 7.8* 8.3* 7.8* 7.7* 8.7*  HCT 25.2* 27.3* 25.2* 24.4* 27.6*  MCV 92.6 92.2 91.6 91.0 90.8  PLT 416* 366 413* 391 962   Basic Metabolic Panel: Recent Labs  Lab 10/16/20 0758 10/16/20 0758 10/17/20 0645 10/17/20 0645 10/18/20 0445 10/19/20 0548 10/20/20 0615 10/22/20 0617 10/23/20 0507  NA 130*   < > 130*   < > 132* 130* 129* 126* 129*  K 3.8   < > 3.7   < > 3.7 3.5 3.4* 4.3 4.3  CL 99   < > 102   < > 101 101 98 95* 96*  CO2 22   < > 20*   < > 22 21* 23 21* 25  GLUCOSE 97   < > 97   < > 86 118* 230* 232* 184*  BUN 34*   < > 43*   < > 29* 33* 25*  46* 32*  CREATININE 3.58*   < > 4.21*   < > 2.95* 3.87* 3.31* 4.36* 3.12*  CALCIUM 6.9*   < > 7.1*   < > 7.2* 7.3* 7.3* 7.7* 7.6*  MG  --   --  1.5*  --   --   --   --   --   --   PHOS 2.5  --  3.2  --   --   --   --   --   --    < > = values in this interval not displayed.  GFR: Estimated Creatinine Clearance: 25.6 mL/min (A) (by C-G formula based on SCr of 3.12 mg/dL (H)). Liver Function Tests: Recent Labs  Lab 10/16/20 0758 10/17/20 0645  ALBUMIN 1.7* 1.7*   No results for input(s): LIPASE, AMYLASE in the last 168 hours. No results for input(s): AMMONIA in the last 168 hours. Coagulation Profile: No results for input(s): INR, PROTIME in the last 168 hours. Cardiac Enzymes: No results for input(s): CKTOTAL, CKMB, CKMBINDEX, TROPONINI in the last 168 hours. BNP (last 3 results) No results for input(s): PROBNP in the last 8760 hours. HbA1C: No results for input(s): HGBA1C in the last 72 hours. CBG: No results for input(s): GLUCAP in the last 168 hours. Lipid Profile: No results for input(s): CHOL, HDL, LDLCALC, TRIG, CHOLHDL, LDLDIRECT in the last 72 hours. Thyroid Function Tests: No results for input(s): TSH, T4TOTAL, FREET4, T3FREE, THYROIDAB in the last 72 hours. Anemia Panel: No results for input(s): VITAMINB12, FOLATE, FERRITIN, TIBC, IRON, RETICCTPCT in the last 72 hours. Sepsis Labs: No results for input(s): PROCALCITON, LATICACIDVEN in the last 168 hours.  Recent Results (from the past 240 hour(s))  MRSA PCR Screening     Status: None   Collection Time: 10/13/20  2:35 PM   Specimen: Nasopharyngeal  Result Value Ref Range Status   MRSA by PCR NEGATIVE NEGATIVE Final    Comment:        The GeneXpert MRSA Assay (FDA approved for NASAL specimens only), is one component of a comprehensive MRSA colonization surveillance program. It is not intended to diagnose MRSA infection nor to guide or monitor treatment for MRSA infections. Performed at [facility],  [address] [place], [place] [address]   C Difficile Quick Screen w PCR reflex     Status: None   Collection Time: 10/14/20  2:15 PM   Specimen: Stool  Result Value Ref Range Status   C Diff antigen NEGATIVE NEGATIVE Final   C Diff toxin NEGATIVE NEGATIVE Final   C Diff interpretation No C. difficile detected.  Final    Comment: Performed at [facility], [address], [place], [place] [address]  Gastrointestinal Panel by PCR , Stool     Status: None   Collection Time: 10/15/20 11:02 AM   Specimen: Stool  Result Value Ref Range Status   Campylobacter species NOT DETECTED NOT DETECTED Final   Plesimonas shigelloides NOT DETECTED NOT DETECTED Final   Salmonella species NOT DETECTED NOT DETECTED Final   Yersinia enterocolitica NOT DETECTED NOT DETECTED Final   Vibrio species NOT DETECTED NOT DETECTED Final   Vibrio cholerae NOT DETECTED NOT DETECTED Final   Enteroaggregative E coli (EAEC) NOT DETECTED NOT DETECTED Final   Enteropathogenic E coli (EPEC) NOT DETECTED NOT DETECTED Final   Enterotoxigenic E coli (ETEC) NOT DETECTED NOT DETECTED Final   Shiga like toxin producing E coli (STEC) NOT DETECTED NOT DETECTED Final   Shigella/Enteroinvasive E coli (EIEC) NOT DETECTED NOT DETECTED Final   Cryptosporidium NOT DETECTED NOT DETECTED Final   Cyclospora cayetanensis NOT DETECTED NOT DETECTED Final   Entamoeba histolytica NOT DETECTED NOT DETECTED Final   Giardia lamblia NOT DETECTED NOT DETECTED Final   Adenovirus F40/41 NOT DETECTED NOT DETECTED Final   Astrovirus NOT DETECTED NOT DETECTED Final   Norovirus GI/GII NOT DETECTED NOT DETECTED Final   Rotavirus A NOT DETECTED NOT DETECTED Final   Sapovirus (I, II, IV, and V) NOT DETECTED NOT DETECTED Final    Comment: Performed at [facility], [place],  [place], [place] [address]    Radiology Studies: CT ABDOMEN PELVIS WO CONTRAST  Result Date: 10/23/2020 CLINICAL DATA:  Abdominal distension with gaseous distention of  bowel on abdominal radiographs. Elevated creatinine. EXAM: CT ABDOMEN AND PELVIS WITHOUT CONTRAST TECHNIQUE: Multidetector CT imaging of the abdomen and pelvis was performed following the standard protocol without IV contrast. COMPARISON:  10/12/2020 FINDINGS: Lower chest: Small bilateral pleural effusions with bilateral basilar atelectasis. Effusions are increasing in size since previous study. Fluid in the distal esophagus suggesting reflux. Hepatobiliary: Normal appearance of the liver. The gallbladder is distended with multiple small stones present. No bile duct dilatation. Pancreas: Unremarkable. No pancreatic ductal dilatation or surrounding inflammatory changes. Spleen: Normal in size without focal abnormality. Adrenals/Urinary Tract: No adrenal gland nodules. Renal parenchyma is atrophic bilaterally. Multiple renal parenchymal calcifications, largest on the left measuring 5 mm in diameter. No hydronephrosis or hydroureter. No ureteral stones are identified. The bladder is normal. Stomach/Bowel: There is prominent diffuse distention of the colon throughout. Stool is demonstrated in the right hemicolon. Small amount of fluid in the descending colon. Rectal contrast material is present with a rectal tube in place. No wall thickening or inflammatory changes. The small bowel are decompressed. Moderately distended stomach with air-fluid levels. Vascular/Lymphatic: Aortic atherosclerosis. No enlarged abdominal or pelvic lymph nodes. Reproductive: Prostate is unremarkable. Other: No free air or free fluid in the abdomen. Abdominal wall musculature appears intact. Musculoskeletal: Degenerative changes in the spine and hips. No destructive bone lesions. IMPRESSION: 1. Prominent diffuse distention of the colon throughout with air-fluid levels. No evidence of small bowel obstruction. No inflammatory changes or obstructing lesion identified. Changes likely to represent adynamic ileus. 2. Small bilateral pleural effusions  with bilateral basilar atelectasis. Effusions are increasing in size since previous study. 3. Fluid in the distal esophagus suggesting reflux. 4. Cholelithiasis with distended gallbladder. 5. Bilateral renal atrophy with multiple renal parenchymal calcifications. No hydronephrosis or hydroureter. 6. Aortic atherosclerosis. Aortic Atherosclerosis (ICD10-I70.0). Electronically Signed   By: Lucienne Capers M.D.   On: 10/23/2020 00:15   DG Abd Portable 2V  Result Date: 10/22/2020 CLINICAL DATA:  Abdominal distension.  History of bowel obstruction. EXAM: PORTABLE ABDOMEN - 2 VIEW COMPARISON:  CT 10/12/2020.  Radiography 09/24/2020. FINDINGS: Gaseous distension of the intestine. Transverse colon measures up to 12 cm in diameter. Moderate stent shin of the small intestine as well, maximal diameter 3.5 cm. Moderate amount of fecal matter in the right colon. No sign of free air. The appearance could be due to pseudo obstruction, ileus or actual distal colon obstruction. Gaseous distension appears even more pronounced than on the study of 10 days ago. IMPRESSION: Gaseous distension of the intestine, even more pronounced than on the study of 10 days ago. The appearance could be due to pseudo obstruction, ileus or actual distal colon obstruction. Electronically Signed   By: Nelson Chimes M.D.   On: 10/22/2020 13:55     LOS: 10 days   Antonieta Pert, MD [facility]  10/23/2020, 7:37 AM

## 2020-10-23 NOTE — Progress Notes (Signed)
Subjective: Feeling better today. Passing gas. No burping. No abdominal pain. Several loose stools overnight.   Objective: Vital signs in last 24 hours: Temp:  [97 F (36.1 C)-98.5 F (36.9 C)] 98.5 F (36.9 C) (11/23 0502) Pulse Rate:  [62-72] 62 (11/23 0502) Resp:  [16-20] 20 (11/23 0502) BP: (135-170)/(77-101) 135/92 (11/23 0502) SpO2:  [96 %-98 %] 96 % (11/23 0502) Weight:  [85 kg-85.8 kg] 85 kg (11/23 0500) Last BM Date: 10/22/20 General:   Alert and oriented, pleasant Head:  Normocephalic and atraumatic. Abdomen:  Bowel sounds present but hypoactive, no distension, abdomen soft, no TTP Neurologic:  Alert and  oriented x4 Psych:  Alert and cooperative. Normal mood and affect.  Intake/Output from previous day: 11/22 0701 - 11/23 0700 In: 795 [P.O.:480; Blood:315] Out: 810 [Urine:810] Intake/Output this shift: No intake/output data recorded.  Lab Results: Recent Labs    10/21/20 0609 10/22/20 0617 10/23/20 0507  WBC 6.9 7.2 9.2  HGB 7.8* 7.7* 8.7*  HCT 25.2* 24.4* 27.6*  PLT 413* 391 307   BMET Recent Labs    10/22/20 0617 10/23/20 0507  NA 126* 129*  K 4.3 4.3  CL 95* 96*  CO2 21* 25  GLUCOSE 232* 184*  BUN 46* 32*  CREATININE 4.36* 3.12*  CALCIUM 7.7* 7.6*    Studies/Results: CT ABDOMEN PELVIS WO CONTRAST  Result Date: 10/23/2020 CLINICAL DATA:  Abdominal distension with gaseous distention of bowel on abdominal radiographs. Elevated creatinine. EXAM: CT ABDOMEN AND PELVIS WITHOUT CONTRAST TECHNIQUE: Multidetector CT imaging of the abdomen and pelvis was performed following the standard protocol without IV contrast. COMPARISON:  10/12/2020 FINDINGS: Lower chest: Small bilateral pleural effusions with bilateral basilar atelectasis. Effusions are increasing in size since previous study. Fluid in the distal esophagus suggesting reflux. Hepatobiliary: Normal appearance of the liver. The gallbladder is distended with multiple small stones present. No  bile duct dilatation. Pancreas: Unremarkable. No pancreatic ductal dilatation or surrounding inflammatory changes. Spleen: Normal in size without focal abnormality. Adrenals/Urinary Tract: No adrenal gland nodules. Renal parenchyma is atrophic bilaterally. Multiple renal parenchymal calcifications, largest on the left measuring 5 mm in diameter. No hydronephrosis or hydroureter. No ureteral stones are identified. The bladder is normal. Stomach/Bowel: There is prominent diffuse distention of the colon throughout. Stool is demonstrated in the right hemicolon. Small amount of fluid in the descending colon. Rectal contrast material is present with a rectal tube in place. No wall thickening or inflammatory changes. The small bowel are decompressed. Moderately distended stomach with air-fluid levels. Vascular/Lymphatic: Aortic atherosclerosis. No enlarged abdominal or pelvic lymph nodes. Reproductive: Prostate is unremarkable. Other: No free air or free fluid in the abdomen. Abdominal wall musculature appears intact. Musculoskeletal: Degenerative changes in the spine and hips. No destructive bone lesions. IMPRESSION: 1. Prominent diffuse distention of the colon throughout with air-fluid levels. No evidence of small bowel obstruction. No inflammatory changes or obstructing lesion identified. Changes likely to represent adynamic ileus. 2. Small bilateral pleural effusions with bilateral basilar atelectasis. Effusions are increasing in size since previous study. 3. Fluid in the distal esophagus suggesting reflux. 4. Cholelithiasis with distended gallbladder. 5. Bilateral renal atrophy with multiple renal parenchymal calcifications. No hydronephrosis or hydroureter. 6. Aortic atherosclerosis. Aortic Atherosclerosis (ICD10-I70.0). Electronically Signed   By: Lucienne Capers M.D.   On: 10/23/2020 00:15   DG Abd Portable 2V  Result Date: 10/22/2020 CLINICAL DATA:  Abdominal distension.  History of bowel obstruction. EXAM:  PORTABLE ABDOMEN - 2 VIEW COMPARISON:  CT 10/12/2020.  Radiography 09/24/2020. FINDINGS: Gaseous distension of the intestine. Transverse colon measures up to 12 cm in diameter. Moderate stent shin of the small intestine as well, maximal diameter 3.5 cm. Moderate amount of fecal matter in the right colon. No sign of free air. The appearance could be due to pseudo obstruction, ileus or actual distal colon obstruction. Gaseous distension appears even more pronounced than on the study of 10 days ago. IMPRESSION: Gaseous distension of the intestine, even more pronounced than on the study of 10 days ago. The appearance could be due to pseudo obstruction, ileus or actual distal colon obstruction. Electronically Signed   By: Nelson Chimes M.D.   On: 10/22/2020 13:55    Assessment: 69 year old pleasant male admitted with several week history of diarrhea with CT findings (without contrast) of colitis, developing rectal bleeding during hospitalization in setting of Eliquis, receiving total of 4 units PRBCs this admission, undergoing colonoscopy this admission with poor prep and incomplete exam. Blood noted in rectum, sigmoid, descending colon, and at splenic flexure. Colonoscopy findings suspicious for segmental or ischemic colitis. Pathology without changes or features of IBD, query drug-induced or ischemic. CMV stains negative. Continues on empiric antibiotics and trial of IV steroids empirically started on 11/19. He has had improvement with frequency of diarrhea and decreased rectal bleeding. However, looser stool still continues.  Abdominal distension yesterday, with Xray concerning for pseduo obstruction, ileus, or distal colon obstruction. CT abdomen with oral contrast and rectal contrast yesterday with prominent diffuse distension of colon, no SBO, no obstruction lesion, likely adynamic ileus. He notes +flatus today, resolution of distension, no N/V, and clinically improved from yesterday. Will follow  conservatively with supportive measures.   Normocytic anemia: Hgb 8.7 today, improved with 1 unit PRBCs yesterday given during dialysis. Rectal bleeding has resolved.      Plan: Low residue diet Complete full 10 days of Cipro and Flagyl Consider tapering Solu-Medrol in near future Will need complete colonoscopy as outpatient   Annitta Needs, PhD, ANP-BC [facility]    LOS: 10 days    10/23/2020, 11:13 AM

## 2020-10-23 NOTE — TOC Progression Note (Signed)
Transition of Care [facility]) - Progression Note    Patient Details  Name: [name] MRN: [id number] Date of Birth: [birth year]  Transition of Care [facility]) CM/SW Contact  Salome Arnt, [place] Phone Number: 10/23/2020, 11:05 AM  Clinical Narrative: Marlinda Mike updated Mardene Celeste at [facility] that pt is not ready to transfer to SNF today. Per Mardene Celeste, they will have a bed tomorrow. [facility] is not able to take pt on Thanksgiving and would consider taking pt after if bed is available. TOC will continue to follow.       Expected Discharge Plan: [place] Barriers to Discharge: Continued Medical Work up  Expected Discharge Plan and Services Expected Discharge Plan: [place] arrangements for the past 2 months: Single Family Home                                       Social Determinants of Health (SDOH) Interventions    Readmission Risk Interventions No flowsheet data found.

## 2020-10-23 NOTE — Progress Notes (Signed)
Physical Therapy Treatment Patient Details Name: [name] MRN: [id number] DOB: Apr 30, 1951 Today's Date: 10/23/2020    History of Present Illness [name]  is a 69 y.o. male, with past medical history of diabetes mellitus, CKD stage V, recent hospitalization at [facility] for which he started hemodialysis for ESRD on MWF schedule, and diagnosed with A. fib when he started on amiodarone, metoprolol and Eliquis, patient was sent from hemodialysis center for near syncope and orthostasis, patient reports he was discharged last week from [place], he is on hemodialysis center since Monday, he does report syncope on Monday after dialysis, as well he does report orthostasis and near syncope for last couple days which he was started on midodrine by his nephrologist, report this morning prior to dialysis he felt dizzy and lightheaded where he was hypotensive for which they sent him to ED, patient report he is still making good amount of urine daily, reports his weight 2 weeks ago was 29.2 kg, and during most dialysis session was at 76, as well he does report diarrhea x2 weeks, he denies fever, chills or abdominal pain, he does report episodes of vomiting on Wednesday, but no recurrence, overall he reports good oral intake and fluid consumption.    PT Comments    Patient tolerated sitting up at bedside for approximately 20 minutes while completing BLE ROM/strengthening exercises, limited for completing right toe raises due to history of foot drop from old injury to right knee, able to take a few side steps at bedside before having to sit due to weakness, mild/moderate c/o of dizziness and BP dropping.  BP's as follows, initial sitting 110/76, sitting after 10 minutes 89/59, standing 68/51.  Patient put back to bed after therapy.  Patient will benefit from continued physical therapy in hospital and recommended venue below to increase strength, balance, endurance for safe ADLs and gait.   Follow Up  Recommendations  SNF     Equipment Recommendations  Rolling walker with 5" wheels    Recommendations for Other Services       Precautions / Restrictions Precautions Precautions: Fall Restrictions Weight Bearing Restrictions: No    Mobility  Bed Mobility Overal bed mobility: Needs Assistance Bed Mobility: Supine to Sit;Sit to Supine     Supine to sit: Supervision Sit to supine: Supervision   General bed mobility comments: demonstrates improvement for sitting up at bedside, increased, slightly labored movement  Transfers Overall transfer level: Needs assistance Equipment used: Rolling walker (2 wheeled) Transfers: Sit to/from Stand Sit to Stand: Mod assist         General transfer comment: labored movement, limited due to BLE weakness and c/o mild/moderate dizziness  Ambulation/Gait Ambulation/Gait assistance: Mod assist Gait Distance (Feet): 4 Feet Assistive device: Rolling walker (2 wheeled) Gait Pattern/deviations: Decreased step length - right;Decreased step length - left;Decreased stride length Gait velocity: slow   General Gait Details: limited to 3-4 slow unsteady labored side steps at bedside due to weakness, poor standing balance, BP dropping   Stairs             Wheelchair Mobility    Modified Rankin (Stroke Patients Only)       Balance Overall balance assessment: Needs assistance Sitting-balance support: Feet supported;No upper extremity supported Sitting balance-Leahy Scale: Fair Sitting balance - Comments: fair/good seated at bedside   Standing balance support: During functional activity;Bilateral upper extremity supported Standing balance-Leahy Scale: Poor Standing balance comment: fair/poor using RW  Cognition Arousal/Alertness: Awake/alert Behavior During Therapy: WFL for tasks assessed/performed Overall Cognitive Status: Within Functional Limits for tasks assessed                                         Exercises General Exercises - Lower Extremity Long Arc Quad: Seated;AROM;Strengthening;Both;10 reps Hip Flexion/Marching: Seated;AROM;Strengthening;Both;10 reps Toe Raises: Seated;AROM;Strengthening;Left;15 reps Heel Raises: Seated;AROM;Strengthening;Both;15 reps    General Comments        Pertinent Vitals/Pain Pain Assessment: No/denies pain    Home Living                      Prior Function            PT Goals (current goals can now be found in the care plan section) Acute Rehab PT Goals Patient Stated Goal: return home after rehab PT Goal Formulation: With patient Time For Goal Achievement: 10/29/20 Potential to Achieve Goals: Good Progress towards PT goals: Progressing toward goals    Frequency    Min 3X/week      PT Plan Current plan remains appropriate    Co-evaluation              AM-PAC PT "6 Clicks" Mobility   Outcome Measure  Help needed turning from your back to your side while in a flat bed without using bedrails?: A Little Help needed moving from lying on your back to sitting on the side of a flat bed without using bedrails?: A Little Help needed moving to and from a bed to a chair (including a wheelchair)?: A Lot Help needed standing up from a chair using your arms (e.g., wheelchair or bedside chair)?: A Lot Help needed to walk in hospital room?: A Lot Help needed climbing 3-5 steps with a railing? : Total 6 Click Score: 13    End of Session   Activity Tolerance: Patient tolerated treatment well;Patient limited by fatigue Patient left: in bed;with call bell/phone within reach Nurse Communication: Mobility status PT Visit Diagnosis: Unsteadiness on feet (R26.81);Other abnormalities of gait and mobility (R26.89);Muscle weakness (generalized) (M62.81)     Time: 8850-2774 PT Time Calculation (min) (ACUTE ONLY): 32 min  Charges:  $Therapeutic Exercise: 8-22 mins $Therapeutic Activity: 8-22  mins                     10:55 AM, 10/23/20 Lonell Grandchild, MPT Physical Therapist with [facility] [phone] office [phone] mobile phone

## 2020-10-24 DIAGNOSIS — D649 Anemia, unspecified: Secondary | ICD-10-CM | POA: Diagnosis not present

## 2020-10-24 DIAGNOSIS — K625 Hemorrhage of anus and rectum: Secondary | ICD-10-CM | POA: Diagnosis not present

## 2020-10-24 DIAGNOSIS — R197 Diarrhea, unspecified: Secondary | ICD-10-CM | POA: Diagnosis not present

## 2020-10-24 DIAGNOSIS — K529 Noninfective gastroenteritis and colitis, unspecified: Secondary | ICD-10-CM | POA: Diagnosis not present

## 2020-10-24 DIAGNOSIS — R933 Abnormal findings on diagnostic imaging of other parts of digestive tract: Secondary | ICD-10-CM | POA: Diagnosis not present

## 2020-10-24 DIAGNOSIS — N186 End stage renal disease: Secondary | ICD-10-CM | POA: Diagnosis not present

## 2020-10-24 LAB — CBC
HCT: 25.6 % — ABNORMAL LOW (ref 39.0–52.0)
Hemoglobin: 8 g/dL — ABNORMAL LOW (ref 13.0–17.0)
MCH: 28.7 pg (ref 26.0–34.0)
MCHC: 31.3 g/dL (ref 30.0–36.0)
MCV: 91.8 fL (ref 80.0–100.0)
Platelets: 307 10*3/uL (ref 150–400)
RBC: 2.79 MIL/uL — ABNORMAL LOW (ref 4.22–5.81)
RDW: 17.7 % — ABNORMAL HIGH (ref 11.5–15.5)
WBC: 7.4 10*3/uL (ref 4.0–10.5)
nRBC: 0 % (ref 0.0–0.2)

## 2020-10-24 LAB — BASIC METABOLIC PANEL
Anion gap: 8 (ref 5–15)
BUN: 47 mg/dL — ABNORMAL HIGH (ref 8–23)
CO2: 23 mmol/L (ref 22–32)
Calcium: 7.6 mg/dL — ABNORMAL LOW (ref 8.9–10.3)
Chloride: 96 mmol/L — ABNORMAL LOW (ref 98–111)
Creatinine, Ser: 3.96 mg/dL — ABNORMAL HIGH (ref 0.61–1.24)
GFR, Estimated: 16 mL/min — ABNORMAL LOW (ref >60)
Glucose, Bld: 225 mg/dL — ABNORMAL HIGH (ref 70–99)
Potassium: 4.6 mmol/L (ref 3.5–5.1)
Sodium: 127 mmol/L — ABNORMAL LOW (ref 135–145)

## 2020-10-24 NOTE — Progress Notes (Signed)
GI Inpatient Follow-up Note    Subjective: abdominal pain about the same as yesterday, more of abdominal bloating discomfort than severe abd pain. Had 2 BM overnight. No rectal bleeding. No nausea/vomiting. Had bacon and eggs for breakfast. Continues to have lightheadedness w/ BP drop on standing.   Scheduled Inpatient Medications:  . (feeding supplement) PROSource Plus  30 mL Oral BID BM  . sodium chloride   Intravenous Once  . amiodarone  200 mg Oral Daily  . calcitRIOL  0.5 mcg Oral Daily  . Chlorhexidine Gluconate Cloth  6 each Topical Q0600  . Chlorhexidine Gluconate Cloth  6 each Topical Q0600  . feeding supplement  237 mL Oral BID BM  . methylPREDNISolone (SOLU-MEDROL) injection  60 mg Intravenous Q24H  . midodrine  10 mg Oral TID with meals  . multivitamin  1 tablet Oral QHS  . simvastatin  20 mg Oral QPM    Continuous Inpatient Infusions:    PRN Inpatient Medications:  alteplase, alum & mag hydroxide-simeth, diphenoxylate-atropine, heparin, ondansetron (ZOFRAN) IV, prochlorperazine  Review of Systems: Constitutional: Weight is stable.  Eyes: No changes in vision. ENT: No oral lesions, sore throat.  GI: see HPI.  Heme/Lymph: No easy bruising.  CV: No chest pain.  GU: No hematuria.  Integumentary: No rashes.  Neuro: No headaches.  Psych: No depression/anxiety.  Endocrine: No heat/cold intolerance.  Allergic/Immunologic: No urticaria.  Resp: No cough, SOB.  Musculoskeletal: No joint swelling.    Physical Examination: BP (!) 164/94 (BP Location: Right Arm)   Pulse 64   Temp 97.8 F (36.6 C) (Oral)   Resp 16   Ht 6\' 2"  (1.88 m)   Wt 77.7 kg   SpO2 97%   BMI 21.99 kg/m  Gen: NAD, alert and oriented x 4, resting comfortably in bed HEENT: PEERLA, EOMI, Neck: supple, no JVD or thyromegaly Chest: CTA bilaterally, no wheezes, crackles, or other adventitious sounds CV: RRR, no m/g/c/r Abd: soft, no tense distention, minimal TTP diffusely  Ext: no edema,  well perfused with 2+ pulses, Skin: no rash or lesions noted Lymph: no LAD  Data: Lab Results  Component Value Date   WBC 7.4 10/24/2020   HGB 8.0 (L) 10/24/2020   HCT 25.6 (L) 10/24/2020   MCV 91.8 10/24/2020   PLT 307 10/24/2020   Recent Labs  Lab 10/22/20 0617 10/23/20 0507 10/24/20 0551  HGB 7.7* 8.7* 8.0*   Lab Results  Component Value Date   NA 127 (L) 10/24/2020   K 4.6 10/24/2020   CL 96 (L) 10/24/2020   CO2 23 10/24/2020   BUN 47 (H) 10/24/2020   CREATININE 3.96 (H) 10/24/2020   Lab Results  Component Value Date   ALT 15 10/12/2020   AST 14 (L) 10/12/2020   ALKPHOS 74 10/12/2020   BILITOT 0.6 10/12/2020   No results for input(s): APTT, INR, PTT in the last 168 hours. Assessment/Plan: [name] is a 69 y.o. male 69 year old pleasant male admitted with several week history of diarrhea with CT findings (without contrast) of colitis, developing rectal bleeding during hospitalization in setting of Eliquis, receiving total of 4 units PRBCs this admission, undergoing colonoscopy this admission with poor prep and incomplete exam. Blood noted in rectum, sigmoid, descending colon, and at splenic flexure. Colonoscopy findings suspicious for segmental or ischemic colitis. Pathology without changes or features of IBD, query drug-induced or ischemic. CMV stains negative. Started on empiric ABX and IV steriods 10/19/20   Developed abd distention 10/22/20 w/ xray concerning for  pseudo obstruction or distal colon obstruction, followed by CT as above. Diet advanced to low residue yesterday. Seems to be tolerating advancing diet without worsening abd pain. Hgb down slightly this AM but no obvious GI bleeding - monitor.  Plan to transition to oral steroids tomorrow if does well today with regular diet.   Case discussed w/ Dr Laural Golden.  Please call with questions or concerns.    [name] Asters, PA-C [facility]

## 2020-10-24 NOTE — Progress Notes (Signed)
Nutrition Follow-up  DOCUMENTATION CODES:   Not applicable  INTERVENTION:  Continue Ensure Enlive po BID, each supplement provides 350 kcal and 20 grams of protein  Continue Prosource Plus 30 ml po BID, each supplement provides 100 kcal and 15 grams of protein (may mix with juice for better po tolerance)  Continue Renavit po daily at bedtime  Double protein portion on breakfast and dinner trays  Encouraged po intake of meals and supplements  NUTRITION DIAGNOSIS:   Increased nutrient needs related to chronic illness, wound healing (ESRD on HD; stage II sacrum) as evidenced by estimated needs. -ongoing   GOAL:   Patient will meet greater than or equal to 90% of their needs -progressing   MONITOR:   Labs, I & O's, Diet advancement, Supplement acceptance, PO intake, Weight trends, Skin  REASON FOR ASSESSMENT:   Consult Assessment of nutrition requirement/status  ASSESSMENT:   69 year old male with DM2, ESRD recently started on HD, atrial fibrillation presented from dialysis center after near syncope and orthostasis  reports feeling dizzy and lightheaded over the past couple of days and 2 weeks of diarrhea.  MWF [facility]  Last HD on 11/22 -no UF due to ongoing episodes of orthostasis, given 1 unit PRBC; Hgb 7.7 >8.7; HD planned for today.  Patient awake sitting up in bed this afternoon, reports feeling good today. Denies nausea, vomiting, reports diarrhea is improving. He endorses good po intake of meals and drinking Ensure supplements, states that he does not like the way ProSource tastes, refused this morning. RD educated on the importance of good protein intake, suggested mixing Prosource with a little juice to disguise taste, he is agreeable to trying.   Meal intake 25-100% since last assessment (63% avg 11/19-11/23)  Weights down -8.1 kg since last HD I/Os: -1014.6 ml since admit UOP: 1375 ml x 24 hrs  Medications reviewed and include: Calcitriol,  Methylprednisolone, Renavit, Prosource Plus, Ensure Enlive  Labs: Na 127 (L), BUN 47 (H), Cr 3.96 (H), Hgb 8.0 (L), HCT 25.6 (L), K 4.6 (WNL)  Diet Order:   Diet Order            Diet Carb Modified Fluid consistency: Thin; Room service appropriate? Yes  Diet effective now                 EDUCATION NEEDS:   Education needs have been addressed  Skin:  Skin Assessment: Skin Integrity Issues: Skin Integrity Issues:: Stage II Stage II: sacrum  Last BM:  11/24-type 7  Height:   Ht Readings from Last 1 Encounters:  10/13/20 6\' 2"  (1.88 m)    Weight:   Wt Readings from Last 1 Encounters:  10/24/20 77.7 kg    BMI:  Body mass index is 21.99 kg/m.  Estimated Nutritional Needs:   Kcal:  3474-2595  Protein:  115-130  Fluid:  UOP + 1000 ml   Lajuan Lines, RD, LDN Clinical Nutrition After Hours/Weekend Pager # in [place]

## 2020-10-24 NOTE — Progress Notes (Signed)
Patient ID: ATZEL MCCAMBRIDGE, male   DOB: 1951-01-10, 69 y.o.   MRN: [id number] S: Mr. Trawick had another episode of orthostasis yesterday with SBP dropping from 140's supine to 60's standing with lightheadedness.  No SOB, CP, N/V/D. O:BP (!) 164/94 (BP Location: Right Arm)   Pulse 64   Temp 97.8 F (36.6 C) (Oral)   Resp 16   Ht 6\' 2"  (1.88 m)   Wt 77.7 kg   SpO2 97%   BMI 21.99 kg/m   Intake/Output Summary (Last 24 hours) at 10/24/2020 1018 Last data filed at 10/24/2020 0700 Gross per 24 hour  Intake 480 ml  Output 1375 ml  Net -895 ml   Intake/Output: I/O last 3 completed shifts: In: 720 [P.O.:720] Out: 6578 [Urine:1375]  Intake/Output this shift:  No intake/output data recorded. Weight change: -8.1 kg Gen: NAD CVS: RRR, no rub Resp: cta Abd: +BS, soft, Nt/ND Ext: no edema  Recent Labs  Lab 10/18/20 0445 10/19/20 0548 10/20/20 0615 10/22/20 0617 10/23/20 0507 10/24/20 0551  NA 132* 130* 129* 126* 129* 127*  K 3.7 3.5 3.4* 4.3 4.3 4.6  CL 101 101 98 95* 96* 96*  CO2 22 21* 23 21* 25 23  GLUCOSE 86 118* 230* 232* 184* 225*  BUN 29* 33* 25* 46* 32* 47*  CREATININE 2.95* 3.87* 3.31* 4.36* 3.12* 3.96*  CALCIUM 7.2* 7.3* 7.3* 7.7* 7.6* 7.6*   Liver Function Tests: No results for input(s): AST, ALT, ALKPHOS, BILITOT, PROT, ALBUMIN in the last 168 hours. No results for input(s): LIPASE, AMYLASE in the last 168 hours. No results for input(s): AMMONIA in the last 168 hours. CBC: Recent Labs  Lab 10/20/20 0615 10/20/20 0615 10/21/20 0609 10/21/20 0609 10/22/20 0617 10/23/20 0507 10/24/20 0551  WBC 4.2   < > 6.9   < > 7.2 9.2 7.4  HGB 8.3*   < > 7.8*   < > 7.7* 8.7* 8.0*  HCT 27.3*   < > 25.2*   < > 24.4* 27.6* 25.6*  MCV 92.2  --  91.6  --  91.0 90.8 91.8  PLT 366   < > 413*   < > 391 307 307   < > = values in this interval not displayed.   Cardiac Enzymes: No results for input(s): CKTOTAL, CKMB, CKMBINDEX, TROPONINI in the last 168 hours. CBG: No results for  input(s): GLUCAP in the last 168 hours.  Iron Studies: No results for input(s): IRON, TIBC, TRANSFERRIN, FERRITIN in the last 72 hours. Studies/Results: CT ABDOMEN PELVIS WO CONTRAST  Result Date: 10/23/2020 CLINICAL DATA:  Abdominal distension with gaseous distention of bowel on abdominal radiographs. Elevated creatinine. EXAM: CT ABDOMEN AND PELVIS WITHOUT CONTRAST TECHNIQUE: Multidetector CT imaging of the abdomen and pelvis was performed following the standard protocol without IV contrast. COMPARISON:  10/12/2020 FINDINGS: Lower chest: Small bilateral pleural effusions with bilateral basilar atelectasis. Effusions are increasing in size since previous study. Fluid in the distal esophagus suggesting reflux. Hepatobiliary: Normal appearance of the liver. The gallbladder is distended with multiple small stones present. No bile duct dilatation. Pancreas: Unremarkable. No pancreatic ductal dilatation or surrounding inflammatory changes. Spleen: Normal in size without focal abnormality. Adrenals/Urinary Tract: No adrenal gland nodules. Renal parenchyma is atrophic bilaterally. Multiple renal parenchymal calcifications, largest on the left measuring 5 mm in diameter. No hydronephrosis or hydroureter. No ureteral stones are identified. The bladder is normal. Stomach/Bowel: There is prominent diffuse distention of the colon throughout. Stool is demonstrated in the right hemicolon. Small amount  of fluid in the descending colon. Rectal contrast material is present with a rectal tube in place. No wall thickening or inflammatory changes. The small bowel are decompressed. Moderately distended stomach with air-fluid levels. Vascular/Lymphatic: Aortic atherosclerosis. No enlarged abdominal or pelvic lymph nodes. Reproductive: Prostate is unremarkable. Other: No free air or free fluid in the abdomen. Abdominal wall musculature appears intact. Musculoskeletal: Degenerative changes in the spine and hips. No destructive bone  lesions. IMPRESSION: 1. Prominent diffuse distention of the colon throughout with air-fluid levels. No evidence of small bowel obstruction. No inflammatory changes or obstructing lesion identified. Changes likely to represent adynamic ileus. 2. Small bilateral pleural effusions with bilateral basilar atelectasis. Effusions are increasing in size since previous study. 3. Fluid in the distal esophagus suggesting reflux. 4. Cholelithiasis with distended gallbladder. 5. Bilateral renal atrophy with multiple renal parenchymal calcifications. No hydronephrosis or hydroureter. 6. Aortic atherosclerosis. Aortic Atherosclerosis (ICD10-I70.0). Electronically Signed   By: Lucienne Capers M.D.   On: 10/23/2020 00:15   DG Abd Portable 2V  Result Date: 10/22/2020 CLINICAL DATA:  Abdominal distension.  History of bowel obstruction. EXAM: PORTABLE ABDOMEN - 2 VIEW COMPARISON:  CT 10/12/2020.  Radiography 09/24/2020. FINDINGS: Gaseous distension of the intestine. Transverse colon measures up to 12 cm in diameter. Moderate stent shin of the small intestine as well, maximal diameter 3.5 cm. Moderate amount of fecal matter in the right colon. No sign of free air. The appearance could be due to pseudo obstruction, ileus or actual distal colon obstruction. Gaseous distension appears even more pronounced than on the study of 10 days ago. IMPRESSION: Gaseous distension of the intestine, even more pronounced than on the study of 10 days ago. The appearance could be due to pseudo obstruction, ileus or actual distal colon obstruction. Electronically Signed   By: Nelson Chimes M.D.   On: 10/22/2020 13:55   . (feeding supplement) PROSource Plus  30 mL Oral BID BM  . sodium chloride   Intravenous Once  . amiodarone  200 mg Oral Daily  . calcitRIOL  0.5 mcg Oral Daily  . Chlorhexidine Gluconate Cloth  6 each Topical Q0600  . Chlorhexidine Gluconate Cloth  6 each Topical Q0600  . feeding supplement  237 mL Oral BID BM  .  methylPREDNISolone (SOLU-MEDROL) injection  60 mg Intravenous Q24H  . midodrine  10 mg Oral TID with meals  . multivitamin  1 tablet Oral QHS  . simvastatin  20 mg Oral QPM    BMET    Component Value Date/Time   NA 127 (L) 10/24/2020 0551   K 4.6 10/24/2020 0551   CL 96 (L) 10/24/2020 0551   CO2 23 10/24/2020 0551   GLUCOSE 225 (H) 10/24/2020 0551   BUN 47 (H) 10/24/2020 0551   CREATININE 3.96 (H) 10/24/2020 0551   CALCIUM 7.6 (L) 10/24/2020 0551   GFRNONAA 16 (L) 10/24/2020 0551   GFRAA 19 (L) 06/15/2018 0407   CBC    Component Value Date/Time   WBC 7.4 10/24/2020 0551   RBC 2.79 (L) 10/24/2020 0551   HGB 8.0 (L) 10/24/2020 0551   HCT 25.6 (L) 10/24/2020 0551   PLT 307 10/24/2020 0551   MCV 91.8 10/24/2020 0551   MCH 28.7 10/24/2020 0551   MCHC 31.3 10/24/2020 0551   RDW 17.7 (H) 10/24/2020 0551   LYMPHSABS 0.6 (L) 10/05/2020 1850   MONOABS 1.2 (H) 10/05/2020 1850   EOSABS 0.0 10/05/2020 1850   BASOSABS 0.0 10/05/2020 1850    Assessment/Plan:  1. Syncope/hypotension;  sig orthostasis: On TID Midodrine.  HD with minimal UF, prob exacerbated by #2.   1. Recommend starting fludrocortisone 0.05 mg each morning and increase as needed. 2. Would also consider pyridostigmine if fludrocortisone does not help. 2. Colitis- GI following and on cipro/flagyl. s/p cscope 11/17: poor prep with incomplete exam, blood in the rectum and sigmoid colon, in the descending colon and at the splenic flexure; colitis involving descending and sigmoid colons seen.  Biopsies performed. Per GI and surgery 3. [facility]Drecent start on MWF at [facility].  HD on schedule cont with no UF today. 4. Anemia:of CKD- s/p blood transfusion. No heparin with HD.  GI on board, cscope findings as above. Rec 1u prbc 11/22 5. 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 Calcitriol 0.67mcg daily (was on this as an outpatient). Calcium corrects 6. Nutrition:per primary, push protein 7. H/o Hypertension:see #1 8. Vascular  access- will need to f/u with Dr. Donnetta Hutching once stable for discharge. 9. A fib- on amiodarone and eliquis (off A/C as of right now) 10. Hyponatremia.  Monitor 11. Disposition- pending ability to safely ambulate before discharge.   Donetta Potts, MD [name] [phone]

## 2020-10-24 NOTE — Plan of Care (Signed)

## 2020-10-24 NOTE — Progress Notes (Signed)
PROGRESS NOTE  [name] FGH:[id number] DOB: 07/31/1951 DOA: 10/12/2020 PCP: Patient, No Pcp Per  Brief History:  69 yo male with history of diabetes mellitus, CKD stage V started recently on dialysis Monday Wednesday Friday and still making urine, recent A. fib and was restarted on amiodarone metoprolol and Eliquis was sent from [facility] for new syncopal event and orthostasis.  As per report he was discharged last week from [place], he is on hemodialysis center since Monday, he does report syncope on Monday after dialysis, as well he does report orthostasis and near syncope for last couple days which he was started on midodrine by his nephrologist, report this morning prior to dialysis he felt dizzy and lightheaded where he was hypotensive for which they sent him to ED, patient report he is still making good amount of urine daily, reports his weight 2 weeks ago was 29.2 kg, and during most dialysis session was at 76,as well he does report diarrhea x2 weeks, he denies fever, chills or abdominal pain, he does report episodes of vomiting on Wednesday, but no recurrence, overall he reports good oral intake and fluid consumption. Patient was noted to be orthostatic127/67 supine, 71/48 standing with no improvement despite receiving 500 cc x 2 bolus, Significant for potassium of 3.2, sodium of 131, nine 8.2, CT abdomen pelvis significant for wall thickening in the rectal area, with redundant dilated sigmoid colon, but no overt volvulus, so [facility] consulted to admit. Nephrology and GI was consulted. Had acute blood loss aded 4 unit PRBC transfused during this admission. And by GI underwent colonoscopy, poor prep were able to obtain biopsy and patient was monitored closely. Patient was placed on IV steroid with improving diarrhea/rectal bleeding.   Assessment/Plan:  Near syncopal events with orthostatic hypotension, on midodrine, recurrent syncope with orthostatic  hypotension: --intravascular volume depletion,blood loss anemia.S/P 4 unit PRBC and IV fluids,  --10/13/20--Echo EF 67-89% grade 1 diastolic dysfunction.   -still orthostatic so received 1 unit PRBC 11/22 with dialysis, no fluid removed with dialysis,  --midodrine has been maximized to 10 mg TID.  --Add knee stocking 20 to30 mm hg.Monitor orthostatic vitals --consider florinef if ok with renal --may need abd binder  Colitis/rectal bleeding with acute blood loss anemia with recent diarrhea: --chronic diarrhea and recent work-up w/ GI panel and C. difficile are negative from 11/14 and 11/15.  --Seen by GI-underwent colonoscopy 11/17-limited exam, colitis of sigmoid and descending colon; blood in rectosigmoid colon --pathology showed severely active chronic nonspecific colitis with ulceration and ascending colon, and  in the sigmoid and descending colon severely active chronic nonspecific colitis with ulceration. --CompletedCipro/Flagyl x10 days 11/23.  --was started on Solu-Medrol 60 mg daily 11/19--per GI  Adynamic ileus  --CT scan 11/22 shows diffuse distention of the colon throughout with air-fluid level, no small bowel obstruction, no infiltrative changes or obstructive lesion identified.   --GI and surgery following continue conservative management  --"bloated" with carb modified diet but no abd pain --had BM evening 11/23  Acute blood loss anemia  --due to rectal bleeding:s/p 4 units PRBC total this admission --monitor H/H --received one unit 11/22  ESRD  --started recently on dialysis MWF --HD on 11/24  Recent paroxysmal A. fib  --started on amiodarone  --Eliquis--holding due to his rectal bleeding and acute blood loss anemia --restart apixaban when/if ok with GI --Patient understands risks and benefits of holding anticoagulation and he seems to be reluctant to start  it again-we will need close follow-up with his cardiologist/PCP.  Hyponatremia,  --sodium is low continue  to adjust dialysis.   Hyperlipidemia:  --Continue statins.    Status is: Inpatient  Remains inpatient appropriate because:IV treatments appropriate due to intensity of illness or inability to take PO   Dispo:  Patient From: Home  Planned Disposition: [place]  Expected discharge date: 10/29/20  Medically stable for discharge: No         Family Communication:  no Family at bedside  Consultants:  Renal, GI  Code Status:  FULL   DVT Prophylaxis:  SCDs   Procedures: As Listed in Progress Note Above  Antibiotics: cipro 11/13>>11/23 Flagyl 11/13>>11/23  RN Pressure Injury Documentation: Pressure Injury 10/13/20 Sacrum Circumferential Stage 2 -  Partial thickness loss of dermis presenting as a shallow open injury with a red, pink wound bed without slough. (Active)  10/13/20 1330  Location: Sacrum  Location Orientation: Circumferential  Staging: Stage 2 -  Partial thickness loss of dermis presenting as a shallow open injury with a red, pink wound bed without slough.  Wound Description (Comments):   Present on Admission: Yes        Subjective: Patient had BM last night.  Feels bloated but no abd pain.  Denies f/c, cp, sob, n/v/d  Objective: Vitals:   10/24/20 1500 10/24/20 1530 10/24/20 1600 10/24/20 1630  BP: (!) 162/94 (!) 159/88 (!) 155/85 (!) 157/82  Pulse: 62 68 66 68  Resp:      Temp:      TempSrc:      SpO2:      Weight:      Height:        Intake/Output Summary (Last 24 hours) at 10/24/2020 1646 Last data filed at 10/24/2020 1350 Gross per 24 hour  Intake 480 ml  Output 1345 ml  Net -865 ml   Weight change: -8.1 kg Exam:   General:  Pt is alert, follows commands appropriately, not in acute distress  HEENT: No icterus, No thrush, No neck mass, [place]/AT  Cardiovascular: RRR, S1/S2, no rubs, no gallops  Respiratory: bibasilar rales. No wheeze  Abdomen: Soft/+BS, non tender, non distended, no guarding  Extremities: No  edema, No lymphangitis, No petechiae, No rashes, no synovitis   Data Reviewed: I have personally reviewed following labs and imaging studies Basic Metabolic Panel: Recent Labs  Lab 10/19/20 0548 10/20/20 0615 10/22/20 0617 10/23/20 0507 10/24/20 0551  NA 130* 129* 126* 129* 127*  K 3.5 3.4* 4.3 4.3 4.6  CL 101 98 95* 96* 96*  CO2 21* 23 21* 25 23  GLUCOSE 118* 230* 232* 184* 225*  BUN 33* 25* 46* 32* 47*  CREATININE 3.87* 3.31* 4.36* 3.12* 3.96*  CALCIUM 7.3* 7.3* 7.7* 7.6* 7.6*   Liver Function Tests: No results for input(s): AST, ALT, ALKPHOS, BILITOT, PROT, ALBUMIN in the last 168 hours. No results for input(s): LIPASE, AMYLASE in the last 168 hours. No results for input(s): AMMONIA in the last 168 hours. Coagulation Profile: No results for input(s): INR, PROTIME in the last 168 hours. CBC: Recent Labs  Lab 10/20/20 0615 10/21/20 0609 10/22/20 0617 10/23/20 0507 10/24/20 0551  WBC 4.2 6.9 7.2 9.2 7.4  HGB 8.3* 7.8* 7.7* 8.7* 8.0*  HCT 27.3* 25.2* 24.4* 27.6* 25.6*  MCV 92.2 91.6 91.0 90.8 91.8  PLT 366 413* 391 307 307   Cardiac Enzymes: No results for input(s): CKTOTAL, CKMB, CKMBINDEX, TROPONINI in the last 168 hours. BNP: Invalid input(s): POCBNP  CBG: No results for input(s): GLUCAP in the last 168 hours. HbA1C: No results for input(s): HGBA1C in the last 72 hours. Urine analysis:    Component Value Date/Time   COLORURINE YELLOW 10/12/2020 1622   APPEARANCEUR CLEAR 10/12/2020 1622   LABSPEC 1.008 10/12/2020 1622   PHURINE 8.0 10/12/2020 1622   GLUCOSEU >=500 (A) 10/12/2020 1622   HGBUR NEGATIVE 10/12/2020 1622   BILIRUBINUR NEGATIVE 10/12/2020 1622   KETONESUR NEGATIVE 10/12/2020 1622   PROTEINUR 100 (A) 10/12/2020 1622   NITRITE NEGATIVE 10/12/2020 1622   LEUKOCYTESUR NEGATIVE 10/12/2020 1622   Sepsis Labs: @LABRCNTIP (procalcitonin:4,lacticidven:4) ) Recent Results (from the past 240 hour(s))  Gastrointestinal Panel by PCR , Stool      Status: None   Collection Time: 10/15/20 11:02 AM   Specimen: Stool  Result Value Ref Range Status   Campylobacter species NOT DETECTED NOT DETECTED Final   Plesimonas shigelloides NOT DETECTED NOT DETECTED Final   Salmonella species NOT DETECTED NOT DETECTED Final   Yersinia enterocolitica NOT DETECTED NOT DETECTED Final   Vibrio species NOT DETECTED NOT DETECTED Final   Vibrio cholerae NOT DETECTED NOT DETECTED Final   Enteroaggregative E coli (EAEC) NOT DETECTED NOT DETECTED Final   Enteropathogenic E coli (EPEC) NOT DETECTED NOT DETECTED Final   Enterotoxigenic E coli (ETEC) NOT DETECTED NOT DETECTED Final   Shiga like toxin producing E coli (STEC) NOT DETECTED NOT DETECTED Final   Shigella/Enteroinvasive E coli (EIEC) NOT DETECTED NOT DETECTED Final   Cryptosporidium NOT DETECTED NOT DETECTED Final   Cyclospora cayetanensis NOT DETECTED NOT DETECTED Final   Entamoeba histolytica NOT DETECTED NOT DETECTED Final   Giardia lamblia NOT DETECTED NOT DETECTED Final   Adenovirus F40/41 NOT DETECTED NOT DETECTED Final   Astrovirus NOT DETECTED NOT DETECTED Final   Norovirus GI/GII NOT DETECTED NOT DETECTED Final   Rotavirus A NOT DETECTED NOT DETECTED Final   Sapovirus (I, II, IV, and V) NOT DETECTED NOT DETECTED Final    Comment: Performed at [facility], [address] [place], [place] [address]     Scheduled Meds: . (feeding supplement) PROSource Plus  30 mL Oral BID BM  . sodium chloride   Intravenous Once  . amiodarone  200 mg Oral Daily  . calcitRIOL  0.5 mcg Oral Daily  . Chlorhexidine Gluconate Cloth  6 each Topical Q0600  . Chlorhexidine Gluconate Cloth  6 each Topical Q0600  . feeding supplement  237 mL Oral BID BM  . methylPREDNISolone (SOLU-MEDROL) injection  60 mg Intravenous Q24H  . midodrine  10 mg Oral TID with meals  . multivitamin  1 tablet Oral QHS  . simvastatin  20 mg Oral QPM   Continuous Infusions:  Procedures/Studies: CT ABDOMEN PELVIS WO  CONTRAST  Result Date: 10/23/2020 CLINICAL DATA:  Abdominal distension with gaseous distention of bowel on abdominal radiographs. Elevated creatinine. EXAM: CT ABDOMEN AND PELVIS WITHOUT CONTRAST TECHNIQUE: Multidetector CT imaging of the abdomen and pelvis was performed following the standard protocol without IV contrast. COMPARISON:  10/12/2020 FINDINGS: Lower chest: Small bilateral pleural effusions with bilateral basilar atelectasis. Effusions are increasing in size since previous study. Fluid in the distal esophagus suggesting reflux. Hepatobiliary: Normal appearance of the liver. The gallbladder is distended with multiple small stones present. No bile duct dilatation. Pancreas: Unremarkable. No pancreatic ductal dilatation or surrounding inflammatory changes. Spleen: Normal in size without focal abnormality. Adrenals/Urinary Tract: No adrenal gland nodules. Renal parenchyma is atrophic bilaterally. Multiple renal parenchymal calcifications, largest on the left  measuring 5 mm in diameter. No hydronephrosis or hydroureter. No ureteral stones are identified. The bladder is normal. Stomach/Bowel: There is prominent diffuse distention of the colon throughout. Stool is demonstrated in the right hemicolon. Small amount of fluid in the descending colon. Rectal contrast material is present with a rectal tube in place. No wall thickening or inflammatory changes. The small bowel are decompressed. Moderately distended stomach with air-fluid levels. Vascular/Lymphatic: Aortic atherosclerosis. No enlarged abdominal or pelvic lymph nodes. Reproductive: Prostate is unremarkable. Other: No free air or free fluid in the abdomen. Abdominal wall musculature appears intact. Musculoskeletal: Degenerative changes in the spine and hips. No destructive bone lesions. IMPRESSION: 1. Prominent diffuse distention of the colon throughout with air-fluid levels. No evidence of small bowel obstruction. No inflammatory changes or  obstructing lesion identified. Changes likely to represent adynamic ileus. 2. Small bilateral pleural effusions with bilateral basilar atelectasis. Effusions are increasing in size since previous study. 3. Fluid in the distal esophagus suggesting reflux. 4. Cholelithiasis with distended gallbladder. 5. Bilateral renal atrophy with multiple renal parenchymal calcifications. No hydronephrosis or hydroureter. 6. Aortic atherosclerosis. Aortic Atherosclerosis (ICD10-I70.0). Electronically Signed   By: Lucienne Capers M.D.   On: 10/23/2020 00:15   CT Abdomen Pelvis Wo Contrast  Result Date: 10/12/2020 CLINICAL DATA:  15 pound weight loss in 2 weeks. Nausea and vomiting. EXAM: CT ABDOMEN AND PELVIS WITHOUT CONTRAST TECHNIQUE: Multidetector CT imaging of the abdomen and pelvis was performed following the standard protocol without IV contrast. COMPARISON:  06/13/2018 FINDINGS: Lower chest: Small bilateral pleural effusions. Contrast medium in the distal esophagus suggesting dysmotility or reflux. Low-density blood pool suggests anemia. Hepatobiliary: Dependent gallstones in the gallbladder. Trace perihepatic ascites. Pancreas: Punctate calcifications in the pancreas likely reflecting chronic calcific pancreatitis. Spleen: Unremarkable Adrenals/Urinary Tract: There about 8 nonobstructive right renal calculi measuring up to 0.5 cm in diameter. There are about 13 nonobstructive left renal calculi measuring up to 0.6 cm in diameter. No hydronephrosis, hydroureter, or ureteral calculus identified. Adrenal glands unremarkable. Stomach/Bowel: Rectal wall thickening noted with redundant dilated sigmoid colon extending up into the upper abdomen, but without overt volvulus. There is wall thickening in the descending colon. Gas-filled dilated transverse colon. Mild segmental wall thickening in the ascending colon. There is edema in the sigmoid colon mesentery and to a lesser extent in the central bowel mesentery normal appendix.  No pneumatosis. Vascular/Lymphatic: Aortoiliac atherosclerotic vascular disease. Reproductive: Mild prostatomegaly. Other: Presacral edema. Mild scattered ascites. Mesenteric edema especially along the sigmoid mesentery. Musculoskeletal: Degenerative disc disease at L2-3 and L5-S1 with endplate irregularity and endplate sclerosis especially at L2-3. This has worsened compared to 06/13/2018. IMPRESSION: 1. Rectal wall thickening with redundant dilated sigmoid colon extending up into the upper abdomen, but without overt volvulus. There is also wall thickening in the descending colon, ascending colon, and sigmoid colon. There is edema in the sigmoid colon mesentery and to a lesser extent in the central bowel mesentery. The appearance is nonspecific but could be due to colitis, inflammatory bowel disease or inflammatory bowel disease; a component of functional colonic abnormalities not excluded given the moderately dilated appearance. 2. Small bilateral pleural effusions. 3. Low-density blood pool suggests anemia. 4. Cholelithiasis. 5. Bilateral nonobstructive nephrolithiasis. 6. Mild prostatomegaly. 7. Degenerative disc disease at L2-3 and L5-S1 with endplate irregularity and endplate sclerosis especially at L2-3. This has worsened compared to 06/13/2018. 8. Contrast medium in the distal esophagus suggesting dysmotility or reflux. 9. Aortic atherosclerosis. Aortic Atherosclerosis (ICD10-I70.0). Electronically Signed   By: Thayer Jew  Janeece Fitting M.D.   On: 10/12/2020 19:10   DG Chest 2 View  Result Date: 10/12/2020 CLINICAL DATA:  Near-syncope at dialysis. EXAM: CHEST - 2 VIEW COMPARISON:  09/24/2020 FINDINGS: A new right jugular dialysis catheter terminates over the lower SVC. The cardiomediastinal silhouette is unchanged with normal heart size. There are small pleural effusions with mild posterior basilar airspace opacity. The lungs are otherwise clear. No edema or pneumothorax is identified. No acute osseous  abnormality is seen. There is persistent gaseous distension of likely large bowel loops in the upper abdomen, also present on the prior study and incompletely evaluated on these chest radiographs. IMPRESSION: 1. Small bilateral pleural effusions with mild basilar atelectasis. 2. Persistent gaseous distension of colon in the upper abdomen, incompletely evaluated. Electronically Signed   By: Logan Bores M.D.   On: 10/12/2020 14:39   DG Abd Portable 2V  Result Date: 10/22/2020 CLINICAL DATA:  Abdominal distension.  History of bowel obstruction. EXAM: PORTABLE ABDOMEN - 2 VIEW COMPARISON:  CT 10/12/2020.  Radiography 09/24/2020. FINDINGS: Gaseous distension of the intestine. Transverse colon measures up to 12 cm in diameter. Moderate stent shin of the small intestine as well, maximal diameter 3.5 cm. Moderate amount of fecal matter in the right colon. No sign of free air. The appearance could be due to pseudo obstruction, ileus or actual distal colon obstruction. Gaseous distension appears even more pronounced than on the study of 10 days ago. IMPRESSION: Gaseous distension of the intestine, even more pronounced than on the study of 10 days ago. The appearance could be due to pseudo obstruction, ileus or actual distal colon obstruction. Electronically Signed   By: Nelson Chimes M.D.   On: 10/22/2020 13:55   ECHOCARDIOGRAM COMPLETE  Result Date: 10/13/2020    ECHOCARDIOGRAM REPORT   Patient Name:   [name] Date of Exam: 10/13/2020 Medical Rec #:  [id number]   Height:       74.0 in Accession #:    [id number]  Weight:       160.0 lb Date of Birth:  10-09-1951   BSA:          1.977 m Patient Age:    48 years    BP:           133/73 mmHg Patient Gender: M           HR:           74 bpm. Exam Location:  [facility] Procedure: 2D Echo, Cardiac Doppler and Color Doppler Indications:    Syncope  History:        Patient has no prior history of Echocardiogram examinations.                 Arrythmias:Atrial  Fibrillation, Signs/Symptoms:Syncope; Risk                 Factors:Dyslipidemia. ESRD.  Sonographer:    Dustin Flock RDCS Referring Phys: [place]  1. Left ventricular ejection fraction, by estimation, is 60 to 65%. The left ventricle has normal function. The left ventricle has no regional wall motion abnormalities. Left ventricular diastolic parameters are consistent with Grade I diastolic dysfunction (impaired relaxation). Elevated left atrial pressure.  2. Right ventricular systolic function is normal. The right ventricular size is normal. There is normal pulmonary artery systolic pressure.  3. The mitral valve is normal in structure. No evidence of mitral valve regurgitation. No evidence of mitral stenosis.  4. The aortic valve is normal in  structure. Aortic valve regurgitation is not visualized. No aortic stenosis is present.  5. There is Moderate (Grade III) protruding plaque involving the transverse aorta.  6. The inferior vena cava is normal in size with greater than 50% respiratory variability, suggesting right atrial pressure of 3 mmHg. FINDINGS  Left Ventricle: Left ventricular ejection fraction, by estimation, is 60 to 65%. The left ventricle has normal function. The left ventricle has no regional wall motion abnormalities. The left ventricular internal cavity size was normal in size. There is  no left ventricular hypertrophy. Left ventricular diastolic parameters are consistent with Grade I diastolic dysfunction (impaired relaxation). Elevated left atrial pressure. Right Ventricle: The right ventricular size is normal. No increase in right ventricular wall thickness. Right ventricular systolic function is normal. There is normal pulmonary artery systolic pressure. The tricuspid regurgitant velocity is 2.55 m/s, and  with an assumed right atrial pressure of 3 mmHg, the estimated right ventricular systolic pressure is 58.0 mmHg. Left Atrium: Left atrial size was normal in  size. Right Atrium: Right atrial size was normal in size. Prominent Eustachian valve. Pericardium: There is no evidence of pericardial effusion. Mitral Valve: The mitral valve is normal in structure. No evidence of mitral valve regurgitation. No evidence of mitral valve stenosis. Tricuspid Valve: The tricuspid valve is normal in structure. Tricuspid valve regurgitation is trivial. No evidence of tricuspid stenosis. Aortic Valve: The aortic valve is normal in structure. Aortic valve regurgitation is not visualized. No aortic stenosis is present. Pulmonic Valve: The pulmonic valve was normal in structure. Pulmonic valve regurgitation is not visualized. No evidence of pulmonic stenosis. Aorta: The aortic root is normal in size and structure. There is moderate (Grade III) protruding plaque involving the transverse aorta. Venous: The inferior vena cava is normal in size with greater than 50% respiratory variability, suggesting right atrial pressure of 3 mmHg. IAS/Shunts: No atrial level shunt detected by color flow Doppler.  LEFT VENTRICLE PLAX 2D LVIDd:         4.96 cm  Diastology LVIDs:         3.66 cm  LV e' medial:    7.29 cm/s LV PW:         1.16 cm  LV E/e' medial:  9.7 LV IVS:        1.17 cm  LV e' lateral:   8.38 cm/s LVOT diam:     2.50 cm  LV E/e' lateral: 8.4 LV SV:         101 LV SV Index:   51 LVOT Area:     4.91 cm  RIGHT VENTRICLE RV Basal diam:  3.10 cm RV S prime:     8.70 cm/s TAPSE (M-mode): 4.2 cm LEFT ATRIUM             Index       RIGHT ATRIUM           Index LA diam:        3.80 cm 1.92 cm/m  RA Area:     19.40 cm LA Vol (A2C):   49.9 ml 25.24 ml/m RA Volume:   53.30 ml  26.96 ml/m LA Vol (A4C):   50.8 ml 25.70 ml/m LA Biplane Vol: 53.0 ml 26.81 ml/m  AORTIC VALVE LVOT Vmax:   99.20 cm/s LVOT Vmean:  67.300 cm/s LVOT VTI:    0.206 m  AORTA Ao Root diam: 3.50 cm MITRAL VALVE               TRICUSPID VALVE  MV Area (PHT): 3.66 cm    TR Peak grad:   26.0 mmHg MV Decel Time: 207 msec    TR Vmax:         255.00 cm/s MV E velocity: 70.60 cm/s MV A velocity: 98.50 cm/s  SHUNTS MV E/A ratio:  0.72        Systemic VTI:  0.21 m                            Systemic Diam: 2.50 cm Dani Gobble Croitoru MD Electronically signed by Sanda Klein MD Signature Date/Time: 10/13/2020/1:20:39 PM    Final     Orson Eva, DO  [facility]  If 7PM-7AM, please contact night-coverage [email or web address] Password [email or web address] 10/24/2020, 4:46 PM   LOS: 11 days

## 2020-10-24 NOTE — Procedures (Signed)
   HEMODIALYSIS TREATMENT NOTE:  3 hour heparin-free HD completed via [facility] TDC. Exit site is unremarkable.  Qb down to 300 due to fluctuating arterial pressures.  Lines reversed with little improvement.  Will try activase prior to next treatment.  Kept even / no fluid removed as per order.  Intra-dialytic pressures (HOB 45* max) stable.  All blood was returned.    He declined to attempt sitting BP post-dialysis, reporting he was nauseous and "afraid I'll throw up."  Primary RN [name] was notified and will give Zofran.  Rockwell Alexandria, RN

## 2020-10-25 ENCOUNTER — Inpatient Hospital Stay (HOSPITAL_COMMUNITY): Payer: Medicare Other

## 2020-10-25 DIAGNOSIS — I4891 Unspecified atrial fibrillation: Secondary | ICD-10-CM

## 2020-10-25 DIAGNOSIS — I951 Orthostatic hypotension: Secondary | ICD-10-CM | POA: Diagnosis not present

## 2020-10-25 LAB — CBC
HCT: 24.3 % — ABNORMAL LOW (ref 39.0–52.0)
Hemoglobin: 7.6 g/dL — ABNORMAL LOW (ref 13.0–17.0)
MCH: 28.9 pg (ref 26.0–34.0)
MCHC: 31.3 g/dL (ref 30.0–36.0)
MCV: 92.4 fL (ref 80.0–100.0)
Platelets: 269 10*3/uL (ref 150–400)
RBC: 2.63 MIL/uL — ABNORMAL LOW (ref 4.22–5.81)
RDW: 17.7 % — ABNORMAL HIGH (ref 11.5–15.5)
WBC: 7.2 10*3/uL (ref 4.0–10.5)
nRBC: 0 % (ref 0.0–0.2)

## 2020-10-25 LAB — C-REACTIVE PROTEIN: CRP: 0.7 mg/dL (ref <1.0)

## 2020-10-25 MED ORDER — PREDNISONE 20 MG PO TABS
40.0000 mg | ORAL_TABLET | Freq: Every day | ORAL | Status: AC
  Administered 2020-10-26 – 2020-11-01 (×7): 40 mg via ORAL
  Filled 2020-10-25 (×7): qty 2

## 2020-10-25 NOTE — Progress Notes (Signed)
PROGRESS NOTE  [name] QQP:[id number] DOB: [birth year] DOA: 10/12/2020 PCP: Patient, No Pcp Per   Brief History:  69 yo male with history of diabetes mellitus, CKD stage V started recently on dialysis Monday Wednesday Friday and still making urine, recent A. fib and was restarted on amiodarone metoprolol and Eliquis was sent from [facility] for new syncopal event and orthostasis.  As per report he was discharged last week from [place], he is on hemodialysis center since Monday, he does report syncope on Monday after dialysis, as well he does report orthostasis and near syncope for last couple days which he was started on midodrine by his nephrologist, report this morning prior to dialysis he felt dizzy and lightheaded where he was hypotensive for which they sent him to ED, patient report he is still making good amount of urine daily, reports his weight 2 weeks ago was 29.2 kg, and during most dialysis session was at 76,as well he does report diarrhea x2 weeks, he denies fever, chills or abdominal pain, he does report episodes of vomiting on Wednesday, but no recurrence, overall he reports good oral intake and fluid consumption. Patient was noted to be orthostatic127/67 supine, 71/48 standing with no improvement despite receiving 500 cc x 2 bolus, Significant for potassium of 3.2, sodium of 131, nine 8.2, CT abdomen pelvis significant for wall thickening in the rectal area, with redundant dilated sigmoid colon, but no overt volvulus, so [facility] consulted to admit. Nephrology and GI was consulted. Had acute blood loss aded 4 unit PRBC transfused during this admission. And by GI underwent colonoscopy, poor prep were able to obtain biopsy and patient was monitored closely. Patient was placed on IV steroid with improving diarrhea/rectal bleeding.   Assessment/Plan:  Near syncopal events with orthostatic hypotension, on midodrine, recurrent syncope with orthostatic  hypotension: --intravascular volume depletion,blood loss anemia.S/P4unit PRBC and IV fluids, --10/13/20--Echo EF 71-24% grade 1 diastolic dysfunction. -still orthostatic so received 1 unit PRBC 11/22 with dialysis, no fluid removed with dialysis,  --midodrine has been maximized to 10mg  TID--pt was not receiving  --Add knee stocking 20 to30 mm hg.Monitor orthostatic vitals --consider florinef if ok with renal --may need abd binder  Colitis/rectal bleeding with acute blood loss anemia with recent diarrhea: --chronic diarrhea and recent work-up w/ GI panel and C. difficile are negative from 11/14 and 11/15. --Seen by GI-underwent colonoscopy 11/17-limited exam, colitis of sigmoid and descending colon; blood in rectosigmoid colon --pathology showed severely active chronic nonspecific colitis with ulceration and ascending colon, and in the sigmoid and descending colon severely active chronic nonspecific colitis with ulceration. --CompletedCipro/Flagyl x10 days 11/23.  --was started on Solu-Medrol 60 mg daily 11/19--per GI                  --11/25 AXR--unchanged gas distension and stool in R colon  Adynamic ileus --CT scan 11/22shows diffuse distention of the colon throughout with air-fluid level, no small bowel obstruction, no infiltrative changes or obstructive lesion identified.  --GI and surgery following continue conservative management  --"bloated" with carb modified diet but no abd pain --had BM 11/24 and 11/25  Acute blood loss anemia --due to rectal bleeding:s/p 4 units PRBC total this admission --monitor H/H --received one unit 11/22  ESRD  --started recently on dialysis MWF --HD on 11/24  Recent paroxysmal A. fib --started on amiodarone  --Eliquis--holding due to his rectal bleeding and acute blood loss anemia --restart apixaban when/if ok  with GI --Patient understands risks and benefits of holding anticoagulation and he seems to be reluctant to start it  again-we will need close follow-up with his cardiologist/PCP. -metoprolol stopped due to orthostasis  Hyponatremia,  --sodiumis low continue to adjust dialysis.   Hyperlipidemia: --Continue statins.    Status is: Inpatient  Remains inpatient appropriate because:IV treatments appropriate due to intensity of illness or inability to take PO   Dispo:             Patient From: Home             Planned Disposition: [place]             Expected discharge date: 10/29/20             Medically stable for discharge: No         Family Communication:  no Family at bedside  Consultants:  Renal, GI  Code Status:  FULL   DVT Prophylaxis:  SCDs   Procedures: As Listed in Progress Note Above  Antibiotics: cipro 11/13>>11/23 Flagyl 11/13>>11/23  RN Pressure Injury Documentation: Pressure Injury 10/13/20 Sacrum Circumferential Stage 2 -  Partial thickness loss of dermis presenting as a shallow open injury with a red, pink wound bed without slough. (Active)  10/13/20 1330  Location: Sacrum  Location Orientation: Circumferential  Staging: Stage 2 -  Partial thickness loss of dermis presenting as a shallow open injury with a red, pink wound bed without slough.  Wound Description (Comments):   Present on Admission: Yes          Subjective: Feels abd is improving.  Having BMs and flatus.  Says abd flatter today.  Denies f/c, cp, n/v/d  Objective: Vitals:   10/24/20 1959 10/25/20 0500 10/25/20 0542 10/25/20 1500  BP: (!) 157/87  (!) 149/75 (!) 154/83  Pulse: 66  70 72  Resp: 18  20 17   Temp: 98.5 F (36.9 C)  (!) 97.4 F (36.3 C) 98.1 F (36.7 C)  TempSrc: Oral  Oral Oral  SpO2: 95%  95% 99%  Weight:  78.9 kg    Height:        Intake/Output Summary (Last 24 hours) at 10/25/2020 1807 Last data filed at 10/25/2020 1700 Gross per 24 hour  Intake 236 ml  Output 500 ml  Net -264 ml   Weight change: 0  kg Exam:   General:  Pt is alert, follows commands appropriately, not in acute distress  HEENT: No icterus, No thrush, No neck mass, [place]/AT  Cardiovascular: RRR, S1/S2, no rubs, no gallops  Respiratory: bibasilar crackles. No wheeze  Abdomen: Soft/+BS, non tender, non distended, no guarding  Extremities: No edema, No lymphangitis, No petechiae, No rashes, no synovitis   Data Reviewed: I have personally reviewed following labs and imaging studies Basic Metabolic Panel: Recent Labs  Lab 10/19/20 0548 10/20/20 0615 10/22/20 0617 10/23/20 0507 10/24/20 0551  NA 130* 129* 126* 129* 127*  K 3.5 3.4* 4.3 4.3 4.6  CL 101 98 95* 96* 96*  CO2 21* 23 21* 25 23  GLUCOSE 118* 230* 232* 184* 225*  BUN 33* 25* 46* 32* 47*  CREATININE 3.87* 3.31* 4.36* 3.12* 3.96*  CALCIUM 7.3* 7.3* 7.7* 7.6* 7.6*   Liver Function Tests: No results for input(s): AST, ALT, ALKPHOS, BILITOT, PROT, ALBUMIN in the last 168 hours. No results for input(s): LIPASE, AMYLASE in the last 168 hours. No results for input(s): AMMONIA in the last 168 hours. Coagulation Profile: No results for input(s):  INR, PROTIME in the last 168 hours. CBC: Recent Labs  Lab 10/21/20 0609 10/22/20 0617 10/23/20 0507 10/24/20 0551 10/25/20 0610  WBC 6.9 7.2 9.2 7.4 7.2  HGB 7.8* 7.7* 8.7* 8.0* 7.6*  HCT 25.2* 24.4* 27.6* 25.6* 24.3*  MCV 91.6 91.0 90.8 91.8 92.4  PLT 413* 391 307 307 269   Cardiac Enzymes: No results for input(s): CKTOTAL, CKMB, CKMBINDEX, TROPONINI in the last 168 hours. BNP: Invalid input(s): POCBNP CBG: No results for input(s): GLUCAP in the last 168 hours. HbA1C: No results for input(s): HGBA1C in the last 72 hours. Urine analysis:    Component Value Date/Time   COLORURINE YELLOW 10/12/2020 1622   APPEARANCEUR CLEAR 10/12/2020 1622   LABSPEC 1.008 10/12/2020 1622   PHURINE 8.0 10/12/2020 1622   GLUCOSEU >=500 (A) 10/12/2020 1622   HGBUR NEGATIVE 10/12/2020 1622   BILIRUBINUR NEGATIVE  10/12/2020 1622   KETONESUR NEGATIVE 10/12/2020 1622   PROTEINUR 100 (A) 10/12/2020 1622   NITRITE NEGATIVE 10/12/2020 1622   LEUKOCYTESUR NEGATIVE 10/12/2020 1622   Sepsis Labs: @LABRCNTIP (procalcitonin:4,lacticidven:4) )No results found for this or any previous visit (from the past 240 hour(s)).   Scheduled Meds: . (feeding supplement) PROSource Plus  30 mL Oral BID BM  . sodium chloride   Intravenous Once  . amiodarone  200 mg Oral Daily  . calcitRIOL  0.5 mcg Oral Daily  . Chlorhexidine Gluconate Cloth  6 each Topical Q0600  . Chlorhexidine Gluconate Cloth  6 each Topical Q0600  . feeding supplement  237 mL Oral BID BM  . midodrine  10 mg Oral TID with meals  . multivitamin  1 tablet Oral QHS  . [START ON 10/26/2020] predniSONE  40 mg Oral Q breakfast  . simvastatin  20 mg Oral QPM   Continuous Infusions:  Procedures/Studies: CT ABDOMEN PELVIS WO CONTRAST  Result Date: 10/23/2020 CLINICAL DATA:  Abdominal distension with gaseous distention of bowel on abdominal radiographs. Elevated creatinine. EXAM: CT ABDOMEN AND PELVIS WITHOUT CONTRAST TECHNIQUE: Multidetector CT imaging of the abdomen and pelvis was performed following the standard protocol without IV contrast. COMPARISON:  10/12/2020 FINDINGS: Lower chest: Small bilateral pleural effusions with bilateral basilar atelectasis. Effusions are increasing in size since previous study. Fluid in the distal esophagus suggesting reflux. Hepatobiliary: Normal appearance of the liver. The gallbladder is distended with multiple small stones present. No bile duct dilatation. Pancreas: Unremarkable. No pancreatic ductal dilatation or surrounding inflammatory changes. Spleen: Normal in size without focal abnormality. Adrenals/Urinary Tract: No adrenal gland nodules. Renal parenchyma is atrophic bilaterally. Multiple renal parenchymal calcifications, largest on the left measuring 5 mm in diameter. No hydronephrosis or hydroureter. No ureteral  stones are identified. The bladder is normal. Stomach/Bowel: There is prominent diffuse distention of the colon throughout. Stool is demonstrated in the right hemicolon. Small amount of fluid in the descending colon. Rectal contrast material is present with a rectal tube in place. No wall thickening or inflammatory changes. The small bowel are decompressed. Moderately distended stomach with air-fluid levels. Vascular/Lymphatic: Aortic atherosclerosis. No enlarged abdominal or pelvic lymph nodes. Reproductive: Prostate is unremarkable. Other: No free air or free fluid in the abdomen. Abdominal wall musculature appears intact. Musculoskeletal: Degenerative changes in the spine and hips. No destructive bone lesions. IMPRESSION: 1. Prominent diffuse distention of the colon throughout with air-fluid levels. No evidence of small bowel obstruction. No inflammatory changes or obstructing lesion identified. Changes likely to represent adynamic ileus. 2. Small bilateral pleural effusions with bilateral basilar atelectasis. Effusions are increasing in size  since previous study. 3. Fluid in the distal esophagus suggesting reflux. 4. Cholelithiasis with distended gallbladder. 5. Bilateral renal atrophy with multiple renal parenchymal calcifications. No hydronephrosis or hydroureter. 6. Aortic atherosclerosis. Aortic Atherosclerosis (ICD10-I70.0). Electronically Signed   By: Lucienne Capers M.D.   On: 10/23/2020 00:15   CT Abdomen Pelvis Wo Contrast  Result Date: 10/12/2020 CLINICAL DATA:  15 pound weight loss in 2 weeks. Nausea and vomiting. EXAM: CT ABDOMEN AND PELVIS WITHOUT CONTRAST TECHNIQUE: Multidetector CT imaging of the abdomen and pelvis was performed following the standard protocol without IV contrast. COMPARISON:  06/13/2018 FINDINGS: Lower chest: Small bilateral pleural effusions. Contrast medium in the distal esophagus suggesting dysmotility or reflux. Low-density blood pool suggests anemia. Hepatobiliary:  Dependent gallstones in the gallbladder. Trace perihepatic ascites. Pancreas: Punctate calcifications in the pancreas likely reflecting chronic calcific pancreatitis. Spleen: Unremarkable Adrenals/Urinary Tract: There about 8 nonobstructive right renal calculi measuring up to 0.5 cm in diameter. There are about 13 nonobstructive left renal calculi measuring up to 0.6 cm in diameter. No hydronephrosis, hydroureter, or ureteral calculus identified. Adrenal glands unremarkable. Stomach/Bowel: Rectal wall thickening noted with redundant dilated sigmoid colon extending up into the upper abdomen, but without overt volvulus. There is wall thickening in the descending colon. Gas-filled dilated transverse colon. Mild segmental wall thickening in the ascending colon. There is edema in the sigmoid colon mesentery and to a lesser extent in the central bowel mesentery normal appendix. No pneumatosis. Vascular/Lymphatic: Aortoiliac atherosclerotic vascular disease. Reproductive: Mild prostatomegaly. Other: Presacral edema. Mild scattered ascites. Mesenteric edema especially along the sigmoid mesentery. Musculoskeletal: Degenerative disc disease at L2-3 and L5-S1 with endplate irregularity and endplate sclerosis especially at L2-3. This has worsened compared to 06/13/2018. IMPRESSION: 1. Rectal wall thickening with redundant dilated sigmoid colon extending up into the upper abdomen, but without overt volvulus. There is also wall thickening in the descending colon, ascending colon, and sigmoid colon. There is edema in the sigmoid colon mesentery and to a lesser extent in the central bowel mesentery. The appearance is nonspecific but could be due to colitis, inflammatory bowel disease or inflammatory bowel disease; a component of functional colonic abnormalities not excluded given the moderately dilated appearance. 2. Small bilateral pleural effusions. 3. Low-density blood pool suggests anemia. 4. Cholelithiasis. 5. Bilateral  nonobstructive nephrolithiasis. 6. Mild prostatomegaly. 7. Degenerative disc disease at L2-3 and L5-S1 with endplate irregularity and endplate sclerosis especially at L2-3. This has worsened compared to 06/13/2018. 8. Contrast medium in the distal esophagus suggesting dysmotility or reflux. 9. Aortic atherosclerosis. Aortic Atherosclerosis (ICD10-I70.0). Electronically Signed   By: Van Clines M.D.   On: 10/12/2020 19:10   DG Chest 2 View  Result Date: 10/12/2020 CLINICAL DATA:  Near-syncope at dialysis. EXAM: CHEST - 2 VIEW COMPARISON:  09/24/2020 FINDINGS: A new right jugular dialysis catheter terminates over the lower SVC. The cardiomediastinal silhouette is unchanged with normal heart size. There are small pleural effusions with mild posterior basilar airspace opacity. The lungs are otherwise clear. No edema or pneumothorax is identified. No acute osseous abnormality is seen. There is persistent gaseous distension of likely large bowel loops in the upper abdomen, also present on the prior study and incompletely evaluated on these chest radiographs. IMPRESSION: 1. Small bilateral pleural effusions with mild basilar atelectasis. 2. Persistent gaseous distension of colon in the upper abdomen, incompletely evaluated. Electronically Signed   By: Logan Bores M.D.   On: 10/12/2020 14:39   DG Abd Portable 1V  Result Date: 10/25/2020 CLINICAL DATA:  Abdominal  distension. EXAM: PORTABLE ABDOMEN - 1 VIEW COMPARISON:  10/22/2020 FINDINGS: There is marked diffuse gaseous distension of the colon the appearance is not significantly changed when compared with 10/22/2020. A large stool burden is identified within the right colon, unchanged. No new findings. IMPRESSION: 1. No change in marked gaseous distension of the colon compatible with colonic ileus. 2. Unchanged large stool burden within the right colon. Electronically Signed   By: Kerby Moors M.D.   On: 10/25/2020 08:07   DG Abd Portable 2V  Result  Date: 10/22/2020 CLINICAL DATA:  Abdominal distension.  History of bowel obstruction. EXAM: PORTABLE ABDOMEN - 2 VIEW COMPARISON:  CT 10/12/2020.  Radiography 09/24/2020. FINDINGS: Gaseous distension of the intestine. Transverse colon measures up to 12 cm in diameter. Moderate stent shin of the small intestine as well, maximal diameter 3.5 cm. Moderate amount of fecal matter in the right colon. No sign of free air. The appearance could be due to pseudo obstruction, ileus or actual distal colon obstruction. Gaseous distension appears even more pronounced than on the study of 10 days ago. IMPRESSION: Gaseous distension of the intestine, even more pronounced than on the study of 10 days ago. The appearance could be due to pseudo obstruction, ileus or actual distal colon obstruction. Electronically Signed   By: Nelson Chimes M.D.   On: 10/22/2020 13:55   ECHOCARDIOGRAM COMPLETE  Result Date: 10/13/2020    ECHOCARDIOGRAM REPORT   Patient Name:   [name] Date of Exam: 10/13/2020 Medical Rec #:  [id number]   Height:       74.0 in Accession #:    [id number]  Weight:       160.0 lb Date of Birth:  [birth year]   BSA:          1.977 m Patient Age:    5 years    BP:           133/73 mmHg Patient Gender: M           HR:           74 bpm. Exam Location:  [facility] Procedure: 2D Echo, Cardiac Doppler and Color Doppler Indications:    Syncope  History:        Patient has no prior history of Echocardiogram examinations.                 Arrythmias:Atrial Fibrillation, Signs/Symptoms:Syncope; Risk                 Factors:Dyslipidemia. ESRD.  Sonographer:    Dustin Flock RDCS Referring Phys: [place]  1. Left ventricular ejection fraction, by estimation, is 60 to 65%. The left ventricle has normal function. The left ventricle has no regional wall motion abnormalities. Left ventricular diastolic parameters are consistent with Grade I diastolic dysfunction (impaired relaxation). Elevated left  atrial pressure.  2. Right ventricular systolic function is normal. The right ventricular size is normal. There is normal pulmonary artery systolic pressure.  3. The mitral valve is normal in structure. No evidence of mitral valve regurgitation. No evidence of mitral stenosis.  4. The aortic valve is normal in structure. Aortic valve regurgitation is not visualized. No aortic stenosis is present.  5. There is Moderate (Grade III) protruding plaque involving the transverse aorta.  6. The inferior vena cava is normal in size with greater than 50% respiratory variability, suggesting right atrial pressure of 3 mmHg. FINDINGS  Left Ventricle: Left ventricular ejection fraction, by estimation, is 60 to  65%. The left ventricle has normal function. The left ventricle has no regional wall motion abnormalities. The left ventricular internal cavity size was normal in size. There is  no left ventricular hypertrophy. Left ventricular diastolic parameters are consistent with Grade I diastolic dysfunction (impaired relaxation). Elevated left atrial pressure. Right Ventricle: The right ventricular size is normal. No increase in right ventricular wall thickness. Right ventricular systolic function is normal. There is normal pulmonary artery systolic pressure. The tricuspid regurgitant velocity is 2.55 m/s, and  with an assumed right atrial pressure of 3 mmHg, the estimated right ventricular systolic pressure is 62.3 mmHg. Left Atrium: Left atrial size was normal in size. Right Atrium: Right atrial size was normal in size. Prominent Eustachian valve. Pericardium: There is no evidence of pericardial effusion. Mitral Valve: The mitral valve is normal in structure. No evidence of mitral valve regurgitation. No evidence of mitral valve stenosis. Tricuspid Valve: The tricuspid valve is normal in structure. Tricuspid valve regurgitation is trivial. No evidence of tricuspid stenosis. Aortic Valve: The aortic valve is normal in structure.  Aortic valve regurgitation is not visualized. No aortic stenosis is present. Pulmonic Valve: The pulmonic valve was normal in structure. Pulmonic valve regurgitation is not visualized. No evidence of pulmonic stenosis. Aorta: The aortic root is normal in size and structure. There is moderate (Grade III) protruding plaque involving the transverse aorta. Venous: The inferior vena cava is normal in size with greater than 50% respiratory variability, suggesting right atrial pressure of 3 mmHg. IAS/Shunts: No atrial level shunt detected by color flow Doppler.  LEFT VENTRICLE PLAX 2D LVIDd:         4.96 cm  Diastology LVIDs:         3.66 cm  LV e' medial:    7.29 cm/s LV PW:         1.16 cm  LV E/e' medial:  9.7 LV IVS:        1.17 cm  LV e' lateral:   8.38 cm/s LVOT diam:     2.50 cm  LV E/e' lateral: 8.4 LV SV:         101 LV SV Index:   51 LVOT Area:     4.91 cm  RIGHT VENTRICLE RV Basal diam:  3.10 cm RV S prime:     8.70 cm/s TAPSE (M-mode): 4.2 cm LEFT ATRIUM             Index       RIGHT ATRIUM           Index LA diam:        3.80 cm 1.92 cm/m  RA Area:     19.40 cm LA Vol (A2C):   49.9 ml 25.24 ml/m RA Volume:   53.30 ml  26.96 ml/m LA Vol (A4C):   50.8 ml 25.70 ml/m LA Biplane Vol: 53.0 ml 26.81 ml/m  AORTIC VALVE LVOT Vmax:   99.20 cm/s LVOT Vmean:  67.300 cm/s LVOT VTI:    0.206 m  AORTA Ao Root diam: 3.50 cm MITRAL VALVE               TRICUSPID VALVE MV Area (PHT): 3.66 cm    TR Peak grad:   26.0 mmHg MV Decel Time: 207 msec    TR Vmax:        255.00 cm/s MV E velocity: 70.60 cm/s MV A velocity: 98.50 cm/s  SHUNTS MV E/A ratio:  0.72        Systemic VTI:  0.21 m                            Systemic Diam: 2.50 cm Sanda Klein MD Electronically signed by Sanda Klein MD Signature Date/Time: 10/13/2020/1:20:39 PM    Final     Orson Eva, DO  [facility]  If 7PM-7AM, please contact night-coverage [email or web address] Password [email or web address] 10/25/2020, 6:07 PM   LOS: 12 days

## 2020-10-25 NOTE — Plan of Care (Signed)

## 2020-10-25 NOTE — Progress Notes (Signed)
Patient states he feels much better today.  Abdomen "flattened out" overnight.  Multiple brown loose bowel movements.  Has not seen any further blood per rectum. Big issue for patient now is his orthostatic dizziness.  He stated the symptoms started right after beginning metoprolol 2 weeks ago.  He is tolerating his diet.   Vital signs in last 24 hours: Temp:  [97.4 F (36.3 C)-98.5 F (36.9 C)] 97.4 F (36.3 C) (11/25 0542) Pulse Rate:  [62-70] 70 (11/25 0542) Resp:  [16-20] 20 (11/25 0542) BP: (149-162)/(75-94) 149/75 (11/25 0542) SpO2:  [95 %] 95 % (11/25 0542) Weight:  [78.9 kg] 78.9 kg (11/25 0500) Last BM Date: 10/24/20 General:   Alert,  pleasant and cooperative in NAD Abdomen: Flat.  Positive bowel sounds.  Entirely soft and nontender without appreciable mass or megaly Extremities:  Without clubbing or edema.    Intake/Output from previous day: 11/24 0701 - 11/25 0700 In: 240 [P.O.:240] Out: 1370 [Urine:770; Emesis/NG output:600] Intake/Output this shift: Total I/O In: 236 [P.O.:236] Out: -   Lab Results: Recent Labs    10/23/20 0507 10/24/20 0551 10/25/20 0610  WBC 9.2 7.4 7.2  HGB 8.7* 8.0* 7.6*  HCT 27.6* 25.6* 24.3*  PLT 307 307 269   BMET Recent Labs    10/23/20 0507 10/24/20 0551  NA 129* 127*  K 4.3 4.6  CL 96* 96*  CO2 25 23  GLUCOSE 184* 225*  BUN 32* 47*  CREATININE 3.12* 3.96*  CALCIUM 7.6* 7.6*   LFT No results for input(s): PROT, ALBUMIN, AST, ALT, ALKPHOS, BILITOT, BILIDIR, IBILI in the last 72 hours. PT/INR No results for input(s): LABPROT, INR in the last 72 hours. Hepatitis Panel No results for input(s): HEPBSAG, HCVAB, HEPAIGM, HEPBIGM in the last 72 hours. C-Diff No results for input(s): CDIFFTOX in the last 72 hours.  KUB reviewed this morning.  Quite a bit of stool in the right colon with persisting diffuse dilation.    Studies/Results: DG Abd Portable 1V  Result Date: 10/25/2020 CLINICAL DATA:  Abdominal distension.  EXAM: PORTABLE ABDOMEN - 1 VIEW COMPARISON:  10/22/2020 FINDINGS: There is marked diffuse gaseous distension of the colon the appearance is not significantly changed when compared with 10/22/2020. A large stool burden is identified within the right colon, unchanged. No new findings. IMPRESSION: 1. No change in marked gaseous distension of the colon compatible with colonic ileus. 2. Unchanged large stool burden within the right colon. Electronically Signed   By: Kerby Moors M.D.   On: 10/25/2020 08:07  Active Problems:   Dehydration   ESRD on dialysis [facility])   Unspecified atrial fibrillation (HCC)   Syncope   Orthostatic hypotension   Chronic diarrhea   Colitis presumed infectious   Pressure injury of skin   Diarrhea   Rectal bleeding   Acute on chronic anemia   Impression: 69 year old gentleman admitted in the hospital with hemorrhagic colitis.  He has associated symptomatic orthostatic hypotension.  Patient notes a strong correlation between onset of his dizziness and initiation of metoprolol about 2 weeks ago. Recent anticoagulation for new onset atrial fibrillation.  From a GI standpoint, clinically, he appears significantly improved to me as compared to when I last saw him a few days ago.  Etiology of colitis not clear.  I feel ischemia remains high on the differential list with infection or new onset inflammatory bowel disease being less likely.  He has received a course of Cipro and Flagyl.  Recommendations:  Continue supportive measures.  We  will convert Solu-Medrol to prednisone 40 mg daily.    Follow-up with Dr. Laural Golden in the coming weeks to reassess.  He will need a high-quality colonoscopy at some point in the future to complete survey of his colon.  I am discontinuing the Lomotil-he did receive a dose today per nursing staff.  Query whether or not addition of new meds recently (amiodarone/metoprolol) have anything to do with this orthostatic instability.

## 2020-10-26 DIAGNOSIS — I951 Orthostatic hypotension: Secondary | ICD-10-CM | POA: Diagnosis not present

## 2020-10-26 DIAGNOSIS — K529 Noninfective gastroenteritis and colitis, unspecified: Secondary | ICD-10-CM | POA: Diagnosis not present

## 2020-10-26 DIAGNOSIS — R197 Diarrhea, unspecified: Secondary | ICD-10-CM | POA: Diagnosis not present

## 2020-10-26 DIAGNOSIS — N186 End stage renal disease: Secondary | ICD-10-CM | POA: Diagnosis not present

## 2020-10-26 DIAGNOSIS — K625 Hemorrhage of anus and rectum: Secondary | ICD-10-CM | POA: Diagnosis not present

## 2020-10-26 LAB — CBC
HCT: 22 % — ABNORMAL LOW (ref 39.0–52.0)
Hemoglobin: 7 g/dL — ABNORMAL LOW (ref 13.0–17.0)
MCH: 29.5 pg (ref 26.0–34.0)
MCHC: 31.8 g/dL (ref 30.0–36.0)
MCV: 92.8 fL (ref 80.0–100.0)
Platelets: 270 10*3/uL (ref 150–400)
RBC: 2.37 MIL/uL — ABNORMAL LOW (ref 4.22–5.81)
RDW: 17.6 % — ABNORMAL HIGH (ref 11.5–15.5)
WBC: 9 10*3/uL (ref 4.0–10.5)
nRBC: 0 % (ref 0.0–0.2)

## 2020-10-26 LAB — PREPARE RBC (CROSSMATCH)

## 2020-10-26 MED ORDER — SODIUM CHLORIDE 0.9% IV SOLUTION: Freq: Once | INTRAVENOUS | Status: DC

## 2020-10-26 NOTE — Progress Notes (Signed)
   10/26/20 1945  Assess: MEWS Score  Temp 98.2 F (36.8 C)  BP 122/78  Pulse Rate (!) 130  Resp 18  Assess: MEWS Score  MEWS Temp 0  MEWS Systolic 0  MEWS Pulse 3  MEWS RR 0  MEWS LOC 0  MEWS Score 3  MEWS Score Color Yellow  Assess: if the MEWS score is Yellow or Red  Were vital signs taken at a resting state? Yes  Focused Assessment No change from prior assessment  Early Detection of Sepsis Score *See Row Information* Low  MEWS guidelines implemented *See Row Information* No, vital signs rechecked  Treat  Pain Scale 0-10  Pain Score 0  Complains of Other (Comment) (No complaints)  Interventions Dark room;Reposition  Document  Patient Outcome Stabilized after interventions  Progress note created (see row info) Yes

## 2020-10-26 NOTE — Procedures (Signed)
   HEMODIALYSIS TREATMENT NOTE:  UOP is increasing.  Urine is now yellow/straw, previously amber/brown.  Reports he ambulated in room today with PT "and didn't feel like I was going to pass out."    3 hour heparin-free HD completed via [facility] TDC.  Poor blood flow through catheter despite alteplase instillation.  Lines/flow reversed multiple times with little improvement.  Max Qb 275 as a result of fluctuation in arterial pressures.  Pre-HD SBP 160s.  Plan was to keep net even. Goal was programmed only for pRBC volume and the anticipated NS volume for circuit flushes (UFR was 430cc/h).    He became tachycardic 120-135 when SBP declined to 130s.  Asymptomatic.  UF was stopped and transfusion was started with hope that as BP rose, HR would normalize.  He was also given scheduled Midodrine. Dr. Carles Collet was notified of above. Transfusion was completed.  BP  increased to as high as 163/100 but HR remained elevated 120-130.  Asymptomatic.  HD was completed and all blood was returned. Post-HD vitals:  143/102 p120 (lying) and 122/78 p130 (sitting).  Standing vitals NOT taken.  Pt maintained that he felt "just fine" and he was tucked back into bed.  Report given to primary RN Stephanie Acre, who will re-evaluate in 1 hour and notify provider if still tachycardic.  No other changes from pre-HD assessment.  Rockwell Alexandria, RN

## 2020-10-26 NOTE — Progress Notes (Signed)
Patient stated he is to receive dialysis and will prefer to receive blood during dialysis. Await on type and screen at this time.

## 2020-10-26 NOTE — Progress Notes (Signed)
PROGRESS NOTE  [name] SHF:[id number] DOB: 05/15/1951 DOA: 10/12/2020 PCP: Patient, No Pcp Per  Brief History:  69yo male with history of diabetes mellitus, CKD stage V started recently on dialysis Monday Wednesday Friday and still making urine, recent A. fib and was restarted on amiodarone metoprolol and Eliquis was sent from [facility] for new syncopal event and orthostasis.  As per report he was discharged last week from [place], he is on hemodialysis center since Monday, he does report syncope on Monday after dialysis, as well he does report orthostasis and near syncope for last couple days which he was started on midodrine by his nephrologist, report this morning prior to dialysis he felt dizzy and lightheaded where he was hypotensive for which they sent him to ED, patient report he is still making good amount of urine daily, reports his weight 2 weeks ago was 29.2 kg, and during most dialysis session was at 76,as well he does report diarrhea x2 weeks, he denies fever, chills or abdominal pain, he does report episodes of vomiting on Wednesday, but no recurrence, overall he reports good oral intake and fluid consumption. Patient was noted to be orthostatic127/67 supine, 71/48 standing with no improvement despite receiving 500 cc x 2 bolus, Significant for potassium of 3.2, sodium of 131, nine 8.2, CT abdomen pelvis significant for wall thickening in the rectal area, with redundant dilated sigmoid colon, but no overt volvulus, so [facility] consulted to admit.\ Nephrology and GI was consulted. Had acute blood loss aded4unit PRBC transfused during this admission. And by GI underwent colonoscopy, poor prep were able to obtain biopsy and patient was monitored closely. Patient was placed on IV steroid with improving diarrhea/rectal bleeding.   Assessment/Plan: Near syncopal events with orthostatic hypotension, on midodrine, recurrent syncope with orthostatic  hypotension: --intravascular volume depletion,blood loss anemia.S/P5unit PRBC total and IV fluids, --10/13/20--EchoEF 50-27%XAJOI 1 diastolic dysfunction. -still orthostatic so received 1 unit PRBC 11/26 with dialysis, no fluid removed with dialysis, --midodrine has been maximized to 10mg  TID--pt was not receiving  --Add knee stocking 20 to24mm hg.Monitor orthostatic vitals --consider florinef if ok with renal --may need abd binder  Colitis/rectal bleeding with acute blood loss anemia with recent diarrhea: --chronic diarrhea and recent work-up w/ GI panel and C. difficile are negative from 11/14 and 11/15. --Seen by GI-underwent colonoscopy 11/17-limited exam,colitis of sigmoid and descending colon; blood in rectosigmoid colon --pathologyshowed severely active chronic nonspecific colitis with ulceration and ascending colon, and in the sigmoid and descending colon severely active chronic nonspecific colitis with ulceration.--CompletedCipro/Flagyl x10 days11/23. --was started on Solu-Medrol 60 mg daily 11/19--per GI>>po 40 mg daily                  --11/25 AXR--unchanged gas distension and stool in R colon  Adynamic ileus --CT scan 11/22shows diffuse distention of the colon throughout with air-fluid level, no small bowel obstruction, no infiltrative changes or obstructive lesion identified. --GI and surgery following continue conservative management --"bloated" with carb modified diet but no abd pain --had BM 11/24 and 11/25  Acute blood loss anemia --due to rectal bleeding:s/p5units PRBC total this admission --monitor H/H --received one unit 11/26 -discussed with GI, Dr. Redgie Grayer active blood loss presently.  Feels drop in Hgb due to CKD and blood draws  ESRD --started recently on dialysis MWF --HD on 11/26  Recent paroxysmal A. fib --started on amiodarone --Eliquis--holdingdue to his rectal bleeding and acute blood loss anemia --restart  apixaban when/if ok with GI --Patient understands risks and benefits of holding anticoagulation and he seems to be reluctant to start it again-we will need close follow-up with his cardiologist/PCP. -metoprolol stopped due to orthostasis  Hyponatremia, --sodiumis low continue to adjust dialysis.   Hyperlipidemia: --Continue statins.    Status is: Inpatient  Remains inpatient appropriate because:IV treatments appropriate due to intensity of illness or inability to take PO   Dispo: Patient From: Home Planned Disposition: [place] Expected discharge date: 10/29/20 Medically stable for discharge: No         Family Communication:noFamily at bedside  Consultants:Renal, GI  Code Status: FULL   DVT Prophylaxis: SCDs   Procedures: As Listed in Progress Note Above  Antibiotics: cipro11/13>>11/23 Flagyl 11/13>>11/23  RN Pressure Injury Documentation: Pressure Injury 10/13/20 Sacrum Circumferential Stage 2 - Partial thickness loss of dermis presenting as a shallow open injury with a red, pink wound bed without slough. (Active)  10/13/20 1330  Location: Sacrum  Location Orientation: Circumferential  Staging: Stage 2 - Partial thickness loss of dermis presenting as a shallow open injury with a red, pink wound bed without slough.  Wound Description (Comments):   Present on Admission: Yes           Status is: Inpatient  Remains inpatient appropriate because:IV treatments appropriate due to intensity of illness or inability to take PO   Dispo:  Patient From: Home  Planned Disposition: [place]  Expected discharge date: 10/29/20  Medically stable for discharge: No         Family Communication:  no Family at bedside  Consultants:  renal  Code Status:  FULL  DVT Prophylaxis:  SCDs   Procedures: As Listed in Progress Note  Above  Antibiotics: None  RN Pressure Injury Documentation: Pressure Injury 10/13/20 Sacrum Circumferential Stage 2 -  Partial thickness loss of dermis presenting as a shallow open injury with a red, pink wound bed without slough. (Active)  10/13/20 1330  Location: Sacrum  Location Orientation: Circumferential  Staging: Stage 2 -  Partial thickness loss of dermis presenting as a shallow open injury with a red, pink wound bed without slough.  Wound Description (Comments):   Present on Admission: Yes        Subjective: Patient denies fevers, chills, headache, chest pain, dyspnea, nausea, vomiting, diarrhea, abdominal pain, dysuria, hematuria, hematochezia, and melena. Having BMs and passing flatus  Objective: Vitals:   10/26/20 1715 10/26/20 1730 10/26/20 1745 10/26/20 1805  BP: (!) 143/84 138/90 135/73 (!) 152/87  Pulse: 86 85 (!) 120 (!) 112  Resp:   16 16  Temp:   98.2 F (36.8 C) 98.2 F (36.8 C)  TempSrc:   Oral Oral  SpO2:   97% 98%  Weight:      Height:        Intake/Output Summary (Last 24 hours) at 10/26/2020 1828 Last data filed at 10/26/2020 1700 Gross per 24 hour  Intake 480 ml  Output 2170 ml  Net -1690 ml   Weight change:  Exam:   General:  Pt is alert, follows commands appropriately, not in acute distress  HEENT: No icterus, No thrush, No neck mass, [place]/AT  Cardiovascular: RRR, S1/S2, no rubs, no gallops  Respiratory: bibasilar rales. No wheeze  Abdomen: Soft/+BS, non tender, non distended, no guarding  Extremities: No edema, No lymphangitis, No petechiae, No rashes, no synovitis   Data Reviewed: I have personally reviewed following labs and imaging studies Basic Metabolic Panel: Recent Labs  Lab 10/20/20 0615 10/22/20 0617 10/23/20 0507 10/24/20 0551  NA 129* 126* 129* 127*  K 3.4* 4.3 4.3 4.6  CL 98 95* 96* 96*  CO2 23 21* 25 23  GLUCOSE 230* 232* 184* 225*  BUN 25* 46* 32* 47*  CREATININE 3.31* 4.36* 3.12* 3.96*  CALCIUM  7.3* 7.7* 7.6* 7.6*   Liver Function Tests: No results for input(s): AST, ALT, ALKPHOS, BILITOT, PROT, ALBUMIN in the last 168 hours. No results for input(s): LIPASE, AMYLASE in the last 168 hours. No results for input(s): AMMONIA in the last 168 hours. Coagulation Profile: No results for input(s): INR, PROTIME in the last 168 hours. CBC: Recent Labs  Lab 10/22/20 0617 10/23/20 0507 10/24/20 0551 10/25/20 0610 10/26/20 0648  WBC 7.2 9.2 7.4 7.2 9.0  HGB 7.7* 8.7* 8.0* 7.6* 7.0*  HCT 24.4* 27.6* 25.6* 24.3* 22.0*  MCV 91.0 90.8 91.8 92.4 92.8  PLT 391 307 307 269 270   Cardiac Enzymes: No results for input(s): CKTOTAL, CKMB, CKMBINDEX, TROPONINI in the last 168 hours. BNP: Invalid input(s): POCBNP CBG: No results for input(s): GLUCAP in the last 168 hours. HbA1C: No results for input(s): HGBA1C in the last 72 hours. Urine analysis:    Component Value Date/Time   COLORURINE YELLOW 10/12/2020 1622   APPEARANCEUR CLEAR 10/12/2020 1622   LABSPEC 1.008 10/12/2020 1622   PHURINE 8.0 10/12/2020 1622   GLUCOSEU >=500 (A) 10/12/2020 1622   HGBUR NEGATIVE 10/12/2020 1622   BILIRUBINUR NEGATIVE 10/12/2020 1622   KETONESUR NEGATIVE 10/12/2020 1622   PROTEINUR 100 (A) 10/12/2020 1622   NITRITE NEGATIVE 10/12/2020 1622   LEUKOCYTESUR NEGATIVE 10/12/2020 1622   Sepsis Labs: @LABRCNTIP (procalcitonin:4,lacticidven:4) )No results found for this or any previous visit (from the past 240 hour(s)).   Scheduled Meds: . (feeding supplement) PROSource Plus  30 mL Oral BID BM  . sodium chloride   Intravenous Once  . sodium chloride   Intravenous Once  . sodium chloride   Intravenous Once  . amiodarone  200 mg Oral Daily  . calcitRIOL  0.5 mcg Oral Daily  . Chlorhexidine Gluconate Cloth  6 each Topical Q0600  . Chlorhexidine Gluconate Cloth  6 each Topical Q0600  . feeding supplement  237 mL Oral BID BM  . midodrine  10 mg Oral TID with meals  . multivitamin  1 tablet Oral QHS  .  predniSONE  40 mg Oral Q breakfast  . simvastatin  20 mg Oral QPM   Continuous Infusions:  Procedures/Studies: CT ABDOMEN PELVIS WO CONTRAST  Result Date: 10/23/2020 CLINICAL DATA:  Abdominal distension with gaseous distention of bowel on abdominal radiographs. Elevated creatinine. EXAM: CT ABDOMEN AND PELVIS WITHOUT CONTRAST TECHNIQUE: Multidetector CT imaging of the abdomen and pelvis was performed following the standard protocol without IV contrast. COMPARISON:  10/12/2020 FINDINGS: Lower chest: Small bilateral pleural effusions with bilateral basilar atelectasis. Effusions are increasing in size since previous study. Fluid in the distal esophagus suggesting reflux. Hepatobiliary: Normal appearance of the liver. The gallbladder is distended with multiple small stones present. No bile duct dilatation. Pancreas: Unremarkable. No pancreatic ductal dilatation or surrounding inflammatory changes. Spleen: Normal in size without focal abnormality. Adrenals/Urinary Tract: No adrenal gland nodules. Renal parenchyma is atrophic bilaterally. Multiple renal parenchymal calcifications, largest on the left measuring 5 mm in diameter. No hydronephrosis or hydroureter. No ureteral stones are identified. The bladder is normal. Stomach/Bowel: There is prominent diffuse distention of the colon throughout. Stool is demonstrated in the right hemicolon. Small amount of fluid in  the descending colon. Rectal contrast material is present with a rectal tube in place. No wall thickening or inflammatory changes. The small bowel are decompressed. Moderately distended stomach with air-fluid levels. Vascular/Lymphatic: Aortic atherosclerosis. No enlarged abdominal or pelvic lymph nodes. Reproductive: Prostate is unremarkable. Other: No free air or free fluid in the abdomen. Abdominal wall musculature appears intact. Musculoskeletal: Degenerative changes in the spine and hips. No destructive bone lesions. IMPRESSION: 1. Prominent  diffuse distention of the colon throughout with air-fluid levels. No evidence of small bowel obstruction. No inflammatory changes or obstructing lesion identified. Changes likely to represent adynamic ileus. 2. Small bilateral pleural effusions with bilateral basilar atelectasis. Effusions are increasing in size since previous study. 3. Fluid in the distal esophagus suggesting reflux. 4. Cholelithiasis with distended gallbladder. 5. Bilateral renal atrophy with multiple renal parenchymal calcifications. No hydronephrosis or hydroureter. 6. Aortic atherosclerosis. Aortic Atherosclerosis (ICD10-I70.0). Electronically Signed   By: Lucienne Capers M.D.   On: 10/23/2020 00:15   CT Abdomen Pelvis Wo Contrast  Result Date: 10/12/2020 CLINICAL DATA:  15 pound weight loss in 2 weeks. Nausea and vomiting. EXAM: CT ABDOMEN AND PELVIS WITHOUT CONTRAST TECHNIQUE: Multidetector CT imaging of the abdomen and pelvis was performed following the standard protocol without IV contrast. COMPARISON:  06/13/2018 FINDINGS: Lower chest: Small bilateral pleural effusions. Contrast medium in the distal esophagus suggesting dysmotility or reflux. Low-density blood pool suggests anemia. Hepatobiliary: Dependent gallstones in the gallbladder. Trace perihepatic ascites. Pancreas: Punctate calcifications in the pancreas likely reflecting chronic calcific pancreatitis. Spleen: Unremarkable Adrenals/Urinary Tract: There about 8 nonobstructive right renal calculi measuring up to 0.5 cm in diameter. There are about 13 nonobstructive left renal calculi measuring up to 0.6 cm in diameter. No hydronephrosis, hydroureter, or ureteral calculus identified. Adrenal glands unremarkable. Stomach/Bowel: Rectal wall thickening noted with redundant dilated sigmoid colon extending up into the upper abdomen, but without overt volvulus. There is wall thickening in the descending colon. Gas-filled dilated transverse colon. Mild segmental wall thickening in the  ascending colon. There is edema in the sigmoid colon mesentery and to a lesser extent in the central bowel mesentery normal appendix. No pneumatosis. Vascular/Lymphatic: Aortoiliac atherosclerotic vascular disease. Reproductive: Mild prostatomegaly. Other: Presacral edema. Mild scattered ascites. Mesenteric edema especially along the sigmoid mesentery. Musculoskeletal: Degenerative disc disease at L2-3 and L5-S1 with endplate irregularity and endplate sclerosis especially at L2-3. This has worsened compared to 06/13/2018. IMPRESSION: 1. Rectal wall thickening with redundant dilated sigmoid colon extending up into the upper abdomen, but without overt volvulus. There is also wall thickening in the descending colon, ascending colon, and sigmoid colon. There is edema in the sigmoid colon mesentery and to a lesser extent in the central bowel mesentery. The appearance is nonspecific but could be due to colitis, inflammatory bowel disease or inflammatory bowel disease; a component of functional colonic abnormalities not excluded given the moderately dilated appearance. 2. Small bilateral pleural effusions. 3. Low-density blood pool suggests anemia. 4. Cholelithiasis. 5. Bilateral nonobstructive nephrolithiasis. 6. Mild prostatomegaly. 7. Degenerative disc disease at L2-3 and L5-S1 with endplate irregularity and endplate sclerosis especially at L2-3. This has worsened compared to 06/13/2018. 8. Contrast medium in the distal esophagus suggesting dysmotility or reflux. 9. Aortic atherosclerosis. Aortic Atherosclerosis (ICD10-I70.0). Electronically Signed   By: Van Clines M.D.   On: 10/12/2020 19:10   DG Chest 2 View  Result Date: 10/12/2020 CLINICAL DATA:  Near-syncope at dialysis. EXAM: CHEST - 2 VIEW COMPARISON:  09/24/2020 FINDINGS: A new right jugular dialysis catheter terminates over  the lower SVC. The cardiomediastinal silhouette is unchanged with normal heart size. There are small pleural effusions with  mild posterior basilar airspace opacity. The lungs are otherwise clear. No edema or pneumothorax is identified. No acute osseous abnormality is seen. There is persistent gaseous distension of likely large bowel loops in the upper abdomen, also present on the prior study and incompletely evaluated on these chest radiographs. IMPRESSION: 1. Small bilateral pleural effusions with mild basilar atelectasis. 2. Persistent gaseous distension of colon in the upper abdomen, incompletely evaluated. Electronically Signed   By: Logan Bores M.D.   On: 10/12/2020 14:39   DG Abd Portable 1V  Result Date: 10/25/2020 CLINICAL DATA:  Abdominal distension. EXAM: PORTABLE ABDOMEN - 1 VIEW COMPARISON:  10/22/2020 FINDINGS: There is marked diffuse gaseous distension of the colon the appearance is not significantly changed when compared with 10/22/2020. A large stool burden is identified within the right colon, unchanged. No new findings. IMPRESSION: 1. No change in marked gaseous distension of the colon compatible with colonic ileus. 2. Unchanged large stool burden within the right colon. Electronically Signed   By: Kerby Moors M.D.   On: 10/25/2020 08:07   DG Abd Portable 2V  Result Date: 10/22/2020 CLINICAL DATA:  Abdominal distension.  History of bowel obstruction. EXAM: PORTABLE ABDOMEN - 2 VIEW COMPARISON:  CT 10/12/2020.  Radiography 09/24/2020. FINDINGS: Gaseous distension of the intestine. Transverse colon measures up to 12 cm in diameter. Moderate stent shin of the small intestine as well, maximal diameter 3.5 cm. Moderate amount of fecal matter in the right colon. No sign of free air. The appearance could be due to pseudo obstruction, ileus or actual distal colon obstruction. Gaseous distension appears even more pronounced than on the study of 10 days ago. IMPRESSION: Gaseous distension of the intestine, even more pronounced than on the study of 10 days ago. The appearance could be due to pseudo obstruction, ileus  or actual distal colon obstruction. Electronically Signed   By: Nelson Chimes M.D.   On: 10/22/2020 13:55   ECHOCARDIOGRAM COMPLETE  Result Date: 10/13/2020    ECHOCARDIOGRAM REPORT   Patient Name:   [name] Date of Exam: 10/13/2020 Medical Rec #:  [id number]   Height:       74.0 in Accession #:    [id number]  Weight:       160.0 lb Date of Birth:  08-26-1951   BSA:          1.977 m Patient Age:    28 years    BP:           133/73 mmHg Patient Gender: M           HR:           74 bpm. Exam Location:  [facility] Procedure: 2D Echo, Cardiac Doppler and Color Doppler Indications:    Syncope  History:        Patient has no prior history of Echocardiogram examinations.                 Arrythmias:Atrial Fibrillation, Signs/Symptoms:Syncope; Risk                 Factors:Dyslipidemia. ESRD.  Sonographer:    Dustin Flock RDCS Referring Phys: [place]  1. Left ventricular ejection fraction, by estimation, is 60 to 65%. The left ventricle has normal function. The left ventricle has no regional wall motion abnormalities. Left ventricular diastolic parameters are consistent with Grade I diastolic  dysfunction (impaired relaxation). Elevated left atrial pressure.  2. Right ventricular systolic function is normal. The right ventricular size is normal. There is normal pulmonary artery systolic pressure.  3. The mitral valve is normal in structure. No evidence of mitral valve regurgitation. No evidence of mitral stenosis.  4. The aortic valve is normal in structure. Aortic valve regurgitation is not visualized. No aortic stenosis is present.  5. There is Moderate (Grade III) protruding plaque involving the transverse aorta.  6. The inferior vena cava is normal in size with greater than 50% respiratory variability, suggesting right atrial pressure of 3 mmHg. FINDINGS  Left Ventricle: Left ventricular ejection fraction, by estimation, is 60 to 65%. The left ventricle has normal function. The left  ventricle has no regional wall motion abnormalities. The left ventricular internal cavity size was normal in size. There is  no left ventricular hypertrophy. Left ventricular diastolic parameters are consistent with Grade I diastolic dysfunction (impaired relaxation). Elevated left atrial pressure. Right Ventricle: The right ventricular size is normal. No increase in right ventricular wall thickness. Right ventricular systolic function is normal. There is normal pulmonary artery systolic pressure. The tricuspid regurgitant velocity is 2.55 m/s, and  with an assumed right atrial pressure of 3 mmHg, the estimated right ventricular systolic pressure is 38.2 mmHg. Left Atrium: Left atrial size was normal in size. Right Atrium: Right atrial size was normal in size. Prominent Eustachian valve. Pericardium: There is no evidence of pericardial effusion. Mitral Valve: The mitral valve is normal in structure. No evidence of mitral valve regurgitation. No evidence of mitral valve stenosis. Tricuspid Valve: The tricuspid valve is normal in structure. Tricuspid valve regurgitation is trivial. No evidence of tricuspid stenosis. Aortic Valve: The aortic valve is normal in structure. Aortic valve regurgitation is not visualized. No aortic stenosis is present. Pulmonic Valve: The pulmonic valve was normal in structure. Pulmonic valve regurgitation is not visualized. No evidence of pulmonic stenosis. Aorta: The aortic root is normal in size and structure. There is moderate (Grade III) protruding plaque involving the transverse aorta. Venous: The inferior vena cava is normal in size with greater than 50% respiratory variability, suggesting right atrial pressure of 3 mmHg. IAS/Shunts: No atrial level shunt detected by color flow Doppler.  LEFT VENTRICLE PLAX 2D LVIDd:         4.96 cm  Diastology LVIDs:         3.66 cm  LV e' medial:    7.29 cm/s LV PW:         1.16 cm  LV E/e' medial:  9.7 LV IVS:        1.17 cm  LV e' lateral:   8.38  cm/s LVOT diam:     2.50 cm  LV E/e' lateral: 8.4 LV SV:         101 LV SV Index:   51 LVOT Area:     4.91 cm  RIGHT VENTRICLE RV Basal diam:  3.10 cm RV S prime:     8.70 cm/s TAPSE (M-mode): 4.2 cm LEFT ATRIUM             Index       RIGHT ATRIUM           Index LA diam:        3.80 cm 1.92 cm/m  RA Area:     19.40 cm LA Vol (A2C):   49.9 ml 25.24 ml/m RA Volume:   53.30 ml  26.96 ml/m LA Vol (A4C):  50.8 ml 25.70 ml/m LA Biplane Vol: 53.0 ml 26.81 ml/m  AORTIC VALVE LVOT Vmax:   99.20 cm/s LVOT Vmean:  67.300 cm/s LVOT VTI:    0.206 m  AORTA Ao Root diam: 3.50 cm MITRAL VALVE               TRICUSPID VALVE MV Area (PHT): 3.66 cm    TR Peak grad:   26.0 mmHg MV Decel Time: 207 msec    TR Vmax:        255.00 cm/s MV E velocity: 70.60 cm/s MV A velocity: 98.50 cm/s  SHUNTS MV E/A ratio:  0.72        Systemic VTI:  0.21 m                            Systemic Diam: 2.50 cm Dani Gobble Croitoru MD Electronically signed by Sanda Klein MD Signature Date/Time: 10/13/2020/1:20:39 PM    Final     Orson Eva, DO  [facility]  If 7PM-7AM, please contact night-coverage [email or web address] Password [email or web address] 10/26/2020, 6:28 PM   LOS: 13 days

## 2020-10-26 NOTE — Progress Notes (Signed)
Physical Therapy Treatment Patient Details Name: [name] MRN: [id number] DOB: Mar 26, 1951 Today's Date: 10/26/2020    History of Present Illness [name]  is a 69 y.o. male, with past medical history of diabetes mellitus, CKD stage V, recent hospitalization at [facility] for which he started hemodialysis for ESRD on MWF schedule, and diagnosed with A. fib when he started on amiodarone, metoprolol and Eliquis, patient was sent from hemodialysis center for near syncope and orthostasis, patient reports he was discharged last week from [place], he is on hemodialysis center since Monday, he does report syncope on Monday after dialysis, as well he does report orthostasis and near syncope for last couple days which he was started on midodrine by his nephrologist, report this morning prior to dialysis he felt dizzy and lightheaded where he was hypotensive for which they sent him to ED, patient report he is still making good amount of urine daily, reports his weight 2 weeks ago was 29.2 kg, and during most dialysis session was at 76, as well he does report diarrhea x2 weeks, he denies fever, chills or abdominal pain, he does report episodes of vomiting on Wednesday, but no recurrence, overall he reports good oral intake and fluid consumption.    PT Comments    Patient able to roll with use of bed rails without assist in order to be cleaned up and change bedding with RN. Patient transfers to sitting EOB with labored movements. He demonstrates good sitting tolerance and sitting balance EOB today. Upon transition to seated BP was 117/79 which decreased to 97/68 with transfer to standing without symptoms. Patient transfer to standing with min/mod A with use of RW while RN changed sheets. Patient then took seated rest break continuing to show good sitting tolerance. Patient again requires min/mod A to transfer to standing and was able to ambulate increased distance in room with slow, unsteady cadence with  use of RW. Patient returned to bed at end of session. Patient will benefit from continued physical therapy in hospital and recommended venue below to increase strength, balance, endurance for safe ADLs and gait.    Follow Up Recommendations  SNF     Equipment Recommendations  Rolling walker with 5" wheels    Recommendations for Other Services       Precautions / Restrictions Precautions Precautions: Fall Restrictions Weight Bearing Restrictions: No    Mobility  Bed Mobility Overal bed mobility: Needs Assistance Bed Mobility: Supine to Sit;Sit to Supine;Rolling Rolling: Independent   Supine to sit: Supervision Sit to supine: Supervision   General bed mobility comments: patient able to roll independently with use of bed rails, he transitions to seated EOB with slightly slow, labored movement  Transfers Overall transfer level: Needs assistance Equipment used: Rolling walker (2 wheeled) Transfers: Sit to/from Stand Sit to Stand: From elevated surface;Min assist;Mod assist         General transfer comment: labored movement requiring assist to transfer to standing with use of RW secondary to weakness  Ambulation/Gait Ambulation/Gait assistance: Min assist Gait Distance (Feet): 15 Feet Assistive device: Rolling walker (2 wheeled) Gait Pattern/deviations: Decreased step length - right;Decreased step length - left;Decreased stride length Gait velocity: slow   General Gait Details: slow, labored, unsteady cadence with use of RW   Stairs             Wheelchair Mobility    Modified Rankin (Stroke Patients Only)       Balance Overall balance assessment: Needs assistance Sitting-balance support: Feet supported;No  upper extremity supported Sitting balance-Leahy Scale: Fair Sitting balance - Comments: fair/good seated at bedside   Standing balance support: During functional activity;Bilateral upper extremity supported Standing balance-Leahy Scale:  Fair Standing balance comment: fair using RW                            Cognition Arousal/Alertness: Awake/alert Behavior During Therapy: WFL for tasks assessed/performed Overall Cognitive Status: Within Functional Limits for tasks assessed                                        Exercises      General Comments        Pertinent Vitals/Pain Pain Assessment: No/denies pain    Home Living                      Prior Function            PT Goals (current goals can now be found in the care plan section) Acute Rehab PT Goals Patient Stated Goal: return home after rehab PT Goal Formulation: With patient Time For Goal Achievement: 10/29/20 Potential to Achieve Goals: Good    Frequency    Min 3X/week      PT Plan Current plan remains appropriate    Co-evaluation              AM-PAC PT "6 Clicks" Mobility   Outcome Measure  Help needed turning from your back to your side while in a flat bed without using bedrails?: A Little Help needed moving from lying on your back to sitting on the side of a flat bed without using bedrails?: A Little Help needed moving to and from a bed to a chair (including a wheelchair)?: A Lot Help needed standing up from a chair using your arms (e.g., wheelchair or bedside chair)?: A Little Help needed to walk in hospital room?: A Lot Help needed climbing 3-5 steps with a railing? : Total 6 Click Score: 14    End of Session Equipment Utilized During Treatment: Gait belt Activity Tolerance: Patient tolerated treatment well;Patient limited by fatigue Patient left: in bed;with call bell/phone within reach Nurse Communication: Mobility status PT Visit Diagnosis: Unsteadiness on feet (R26.81);Other abnormalities of gait and mobility (R26.89);Muscle weakness (generalized) (M62.81)     Time: 3354-5625 PT Time Calculation (min) (ACUTE ONLY): 29 min  Charges:  $Therapeutic Activity: 23-37 mins                      9:48 AM, 10/26/20 Mearl Latin PT, DPT Physical Therapist at [facility]

## 2020-10-26 NOTE — Progress Notes (Signed)
[name], M.D. Gastroenterology & Hepatology   Interval History: Patient reports feeling well today. States that he had 1 bowel movements with normal consistency yesterday no more diarrhea, denies have any blood in his stool. No abdominal pain, nausea, vomiting, fever or chills. Reports that his lightheadedness has resolved with the use of midodrine. Has no complaints today. Even though the patient has not presented any melena or hematochezia for the last few days, his hemoglobin today was 7.0. MCV was 92.  Inpatient Medications:  Current Facility-Administered Medications:  .  (feeding supplement) PROSource Plus liquid 30 mL, 30 mL, Oral, BID BM, Kc, Ramesh, MD, 30 mL at 10/20/20 0843 .  0.9 %  sodium chloride infusion (Manually program via Guardrails IV Fluids), , Intravenous, Once, Sanford, Ryan B, MD .  0.9 %  sodium chloride infusion (Manually program via Guardrails IV Fluids), , Intravenous, Once, Tat, David, MD .  0.9 %  sodium chloride infusion (Manually program via Guardrails IV Fluids), , Intravenous, Once, Donato Heinz, MD .  alteplase (CATHFLO ACTIVASE) injection 2 mg, 2 mg, Intracatheter, Once PRN, Justin Mend, MD .  alum & mag hydroxide-simeth (MAALOX/MYLANTA) 200-200-20 MG/5ML suspension 30 mL, 30 mL, Oral, Q4H PRN, Kc, Ramesh, MD, 30 mL at 10/24/20 1131 .  amiodarone (PACERONE) tablet 200 mg, 200 mg, Oral, Daily, Elgergawy, Silver Huguenin, MD, 200 mg at 10/26/20 0906 .  calcitRIOL (ROCALTROL) capsule 0.5 mcg, 0.5 mcg, Oral, Daily, Donato Heinz, MD, 0.5 mcg at 10/26/20 0906 .  Chlorhexidine Gluconate Cloth 2 % PADS 6 each, 6 each, Topical, Q0600, Justin Mend, MD, 6 each at 10/24/20 [phone] .  Chlorhexidine Gluconate Cloth 2 % PADS 6 each, 6 each, Topical, Q0600, Edrick Oh, MD, 6 each at 10/26/20 0444 .  feeding supplement (ENSURE ENLIVE / ENSURE PLUS) liquid 237 mL, 237 mL, Oral, BID BM, Kc, Ramesh, MD, 237 mL at 10/26/20 0907 .  heparin injection 3,800  Units, 3,800 Units, Dialysis, PRN, Justin Mend, MD, 3,800 Units at 10/14/20 0045 .  midodrine (PROAMATINE) tablet 10 mg, 10 mg, Oral, TID with meals, Kc, Ramesh, MD, 10 mg at 10/26/20 1254 .  multivitamin (RENA-VIT) tablet 1 tablet, 1 tablet, Oral, QHS, Kc, Ramesh, MD, 1 tablet at 10/25/20 2204 .  ondansetron (ZOFRAN) injection 4 mg, 4 mg, Intravenous, Q8H PRN, Johnson, Clanford L, MD, 4 mg at 10/24/20 1733 .  predniSONE (DELTASONE) tablet 40 mg, 40 mg, Oral, Q breakfast, Rourk, Cristopher Estimable, MD, 40 mg at 10/26/20 0906 .  prochlorperazine (COMPAZINE) injection 10 mg, 10 mg, Intravenous, Q6H PRN, Wynetta Emery, Clanford L, MD, 10 mg at 10/13/20 2046 .  simvastatin (ZOCOR) tablet 20 mg, 20 mg, Oral, QPM, Elgergawy, Silver Huguenin, MD, 20 mg at 10/25/20 1705   I/O    Intake/Output Summary (Last 24 hours) at 10/26/2020 1339 Last data filed at 10/26/2020 0847 Gross per 24 hour  Intake 240 ml  Output 1550 ml  Net -1310 ml     Physical Exam: Temp:  [97.3 F (36.3 C)-99.8 F (37.7 C)] 97.3 F (36.3 C) (11/26 1321) Pulse Rate:  [72-77] 77 (11/26 1321) Resp:  [16-17] 16 (11/26 1321) BP: (152-160)/(83-92) 160/92 (11/26 1321) SpO2:  [95 %-99 %] 97 % (11/26 1321)  Temp (24hrs), Avg:98.5 F (36.9 C), Min:97.3 F (36.3 C), Max:99.8 F (37.7 C) GENERAL: The patient is AO x3, in no acute distress. HEENT: Head is normocephalic and atraumatic. EOMI are intact. Mouth is well hydrated and without lesions. NECK: Supple. No masses LUNGS: Clear to  auscultation. No presence of rhonchi/wheezing/rales. Adequate chest expansion HEART: RRR, normal s1 and s2. ABDOMEN: Soft, nontender, no guarding, no peritoneal signs, and nondistended. BS +. No masses. EXTREMITIES: Without any cyanosis, clubbing, rash, lesions or edema. NEUROLOGIC: AOx3, no focal motor deficit. SKIN: no jaundice, no rashes  Laboratory Data: CBC:     Component Value Date/Time   WBC 9.0 10/26/2020 0648   RBC 2.37 (L) 10/26/2020 0648   HGB 7.0  (L) 10/26/2020 0648   HCT 22.0 (L) 10/26/2020 0648   PLT 270 10/26/2020 0648   MCV 92.8 10/26/2020 0648   MCH 29.5 10/26/2020 0648   MCHC 31.8 10/26/2020 0648   RDW 17.6 (H) 10/26/2020 0648   LYMPHSABS 0.6 (L) 10/05/2020 1850   MONOABS 1.2 (H) 10/05/2020 1850   EOSABS 0.0 10/05/2020 1850   BASOSABS 0.0 10/05/2020 1850   COAG:  Lab Results  Component Value Date   INR 1.1 08/13/2020   INR 1.01 06/08/2018    BMP:  BMP Latest Ref Rng & Units 10/24/2020 10/23/2020 10/22/2020  Glucose 70 - 99 mg/dL 225(H) 184(H) 232(H)  BUN 8 - 23 mg/dL 47(H) 32(H) 46(H)  Creatinine 0.61 - 1.24 mg/dL 3.96(H) 3.12(H) 4.36(H)  Sodium 135 - 145 mmol/L 127(L) 129(L) 126(L)  Potassium 3.5 - 5.1 mmol/L 4.6 4.3 4.3  Chloride 98 - 111 mmol/L 96(L) 96(L) 95(L)  CO2 22 - 32 mmol/L 23 25 21(L)  Calcium 8.9 - 10.3 mg/dL 7.6(L) 7.6(L) 7.7(L)    HEPATIC:  Hepatic Function Latest Ref Rng & Units 10/17/2020 10/16/2020 10/15/2020  Total Protein 6.5 - 8.1 g/dL - - -  Albumin 3.5 - 5.0 g/dL 1.7(L) 1.7(L) 1.8(L)  AST 15 - 41 U/L - - -  ALT 0 - 44 U/L - - -  Alk Phosphatase 38 - 126 U/L - - -  Total Bilirubin 0.3 - 1.2 mg/dL - - -  Bilirubin, Direct 0.0 - 0.2 mg/dL - - -    CARDIAC:  Lab Results  Component Value Date   CKTOTAL 356 06/08/2018      Imaging: I personally reviewed and interpreted the available labs, imaging and endoscopic files.   Assessment/Plan: 69 year old male with past medical history of end-stage renal disease on hemodialysis, A. fib, diabetes, who was admitted to the hospital after presenting worsening diarrhea and rectal bleeding. Patient was found to have colitis on imaging as well as presenting colonic dilation. Underwent colonoscopy which had poor preparation, there was presence of coffee-ground contents and edema, erythema and ulceration up to the descending colon, with biopsies showing severely active chronic colitis with ulceration. He was empirically started on steroids, has  presented some improvement of his symptoms for his colitis. Also received and empiric course of cirpofloxacin and Flagyl. It is unclear why he presented the colitis but infectious sources have been ruled out. At this moment, we will need to continue his steroids and if clinically improving he may be discharged home with need to repeat colonoscopy and steroid taper in the next few weeks. Patient understood and agreed.  # Colitis # Subacute diarrhea - c/w Prednisone 40 mg qday - Continue supportive measures - Will need repeat colonoscopy as outpatient and follow up in GI clinic  [name] Peppers, MD Gastroenterology and Hepatology [facility]  Note: Occasional unusual wording and randomly placed punctuation marks may result from the use of speech recognition technology to transcribe this document

## 2020-10-26 NOTE — Progress Notes (Signed)
   10/26/20 1915  Assess: MEWS Score  BP (!) 140/113  Pulse Rate (!) 125  Assess: MEWS Score  MEWS Temp 0  MEWS Systolic 0  MEWS Pulse 2  MEWS RR 0  MEWS LOC 0  MEWS Score 2  MEWS Score Color Yellow  Assess: if the MEWS score is Yellow or Red  Were vital signs taken at a resting state? Yes  Focused Assessment No change from prior assessment  Early Detection of Sepsis Score *See Row Information* Low  MEWS guidelines implemented *See Row Information* No, vital signs rechecked  Treat  MEWS Interventions Other (Comment) (Currently getting dialysis)  Pain Scale 0-10  Pain Score 0

## 2020-10-26 NOTE — Progress Notes (Signed)
Patient ID: [name], male   DOB: [birth year], 69 y.o.   MRN: [id number] S: Feels better today and getting "stronger".  Still dropping BP upon standing but not as bad per his report. O:BP (!) 152/84 (BP Location: Right Arm)   Pulse 76   Temp 99.8 F (37.7 C)   Resp 16   Ht 6\' 2"  (1.88 m)   Wt 78.9 kg   SpO2 99%   BMI 22.34 kg/m   Intake/Output Summary (Last 24 hours) at 10/26/2020 0947 Last data filed at 10/26/2020 0847 Gross per 24 hour  Intake 480 ml  Output 1550 ml  Net -1070 ml   Intake/Output: I/O last 3 completed shifts: In: 1076 [P.O.:1076] Out: 1100 [Urine:1000; Emesis/NG output:100]  Intake/Output this shift:  Total I/O In: -  Out: 800 [Urine:800] Weight change:  Gen: NAD CVS: RRR Resp: cta Abd: +BS, soft, Nt/ND Ext: trace ankle edema bilaterally  Recent Labs  Lab 10/20/20 0615 10/22/20 0617 10/23/20 0507 10/24/20 0551  NA 129* 126* 129* 127*  K 3.4* 4.3 4.3 4.6  CL 98 95* 96* 96*  CO2 23 21* 25 23  GLUCOSE 230* 232* 184* 225*  BUN 25* 46* 32* 47*  CREATININE 3.31* 4.36* 3.12* 3.96*  CALCIUM 7.3* 7.7* 7.6* 7.6*   Liver Function Tests: No results for input(s): AST, ALT, ALKPHOS, BILITOT, PROT, ALBUMIN in the last 168 hours. No results for input(s): LIPASE, AMYLASE in the last 168 hours. No results for input(s): AMMONIA in the last 168 hours. CBC: Recent Labs  Lab 10/22/20 0617 10/22/20 0617 10/23/20 0507 10/23/20 0507 10/24/20 0551 10/25/20 0610 10/26/20 0648  WBC 7.2   < > 9.2   < > 7.4 7.2 9.0  HGB 7.7*   < > 8.7*   < > 8.0* 7.6* 7.0*  HCT 24.4*   < > 27.6*   < > 25.6* 24.3* 22.0*  MCV 91.0  --  90.8  --  91.8 92.4 92.8  PLT 391   < > 307   < > 307 269 270   < > = values in this interval not displayed.   Cardiac Enzymes: No results for input(s): CKTOTAL, CKMB, CKMBINDEX, TROPONINI in the last 168 hours. CBG: No results for input(s): GLUCAP in the last 168 hours.  Iron Studies: No results for input(s): IRON, TIBC, TRANSFERRIN,  FERRITIN in the last 72 hours. Studies/Results: DG Abd Portable 1V  Result Date: 10/25/2020 CLINICAL DATA:  Abdominal distension. EXAM: PORTABLE ABDOMEN - 1 VIEW COMPARISON:  10/22/2020 FINDINGS: There is marked diffuse gaseous distension of the colon the appearance is not significantly changed when compared with 10/22/2020. A large stool burden is identified within the right colon, unchanged. No new findings. IMPRESSION: 1. No change in marked gaseous distension of the colon compatible with colonic ileus. 2. Unchanged large stool burden within the right colon. Electronically Signed   By: Kerby Moors M.D.   On: 10/25/2020 08:07   . (feeding supplement) PROSource Plus  30 mL Oral BID BM  . sodium chloride   Intravenous Once  . sodium chloride   Intravenous Once  . amiodarone  200 mg Oral Daily  . calcitRIOL  0.5 mcg Oral Daily  . Chlorhexidine Gluconate Cloth  6 each Topical Q0600  . Chlorhexidine Gluconate Cloth  6 each Topical Q0600  . feeding supplement  237 mL Oral BID BM  . midodrine  10 mg Oral TID with meals  . multivitamin  1 tablet Oral QHS  . predniSONE  40 mg Oral Q breakfast  . simvastatin  20 mg Oral QPM    BMET    Component Value Date/Time   NA 127 (L) 10/24/2020 0551   K 4.6 10/24/2020 0551   CL 96 (L) 10/24/2020 0551   CO2 23 10/24/2020 0551   GLUCOSE 225 (H) 10/24/2020 0551   BUN 47 (H) 10/24/2020 0551   CREATININE 3.96 (H) 10/24/2020 0551   CALCIUM 7.6 (L) 10/24/2020 0551   GFRNONAA 16 (L) 10/24/2020 0551   GFRAA 19 (L) 06/15/2018 0407   CBC    Component Value Date/Time   WBC 9.0 10/26/2020 0648   RBC 2.37 (L) 10/26/2020 0648   HGB 7.0 (L) 10/26/2020 0648   HCT 22.0 (L) 10/26/2020 0648   PLT 270 10/26/2020 0648   MCV 92.8 10/26/2020 0648   MCH 29.5 10/26/2020 0648   MCHC 31.8 10/26/2020 0648   RDW 17.6 (H) 10/26/2020 0648   LYMPHSABS 0.6 (L) 10/05/2020 1850   MONOABS 1.2 (H) 10/05/2020 1850   EOSABS 0.0 10/05/2020 1850   BASOSABS 0.0 10/05/2020  1850     Assessment/Plan:  1. Syncope/hypotension; sig orthostasis:On TID Midodrine. HD with minimal UF, prob exacerbated by #2.   1. Recommend starting fludrocortisone 0.05 mg each morning and increase as needed. 2. Would also consider pyridostigmine if fludrocortisone does not help. 3. Also recommend blood transfusion today since Hgb down to 7. 4. Keep even with HD. 2. Colitis- GI following and on cipro/flagyl. s/p cscope 11/17: poor prep with incomplete exam, blood in the rectum and sigmoid colon, in the descending colon and at the splenic flexure; colitis involving descending and sigmoid colons seen. Biopsies performed. Per GI and surgery 3. [facility] HD on schedule cont withno UF today. 4. Anemia:of CKD- s/p blood transfusion. No heparin with HD. GI on board, colonoscopy findings as above. Rec 1u prbc 11/22 1. Recommend another blood transfusion today with HD since Hgb down to 7. 5. CKD-MBD:continue with home meds.Calcitriol 0.72mcg daily (was on this as an outpatient). Calcium corrects 6. Nutrition:per primary, push protein 7. H/o Hypertension:see #1 8. Vascular access- will need to f/u with Dr. Donnetta Hutching once stable for discharge. 9. A fib- on amiodarone and eliquis (off A/C as of right now) 10. Hyponatremia. Monitor 11. Disposition- pending ability to safely ambulate before discharge.   Donetta Potts, MD [name] [phone]

## 2020-10-26 NOTE — Plan of Care (Signed)

## 2020-10-26 NOTE — TOC Progression Note (Addendum)
Transition of Care [facility]) - Progression Note   Patient Details  Name: DEKARI BURES MRN: [id number] Date of Birth: 02-Oct-1951  [facility] CM/SW [place], LCSW Phone Number: 10/26/2020, 10:20 AM  Clinical Narrative: CSW left voicemail for Mardene Celeste in admissions at [facility] regarding patient being medically ready for discharge and requested call back. TOC to follow.  Expected Discharge Plan: [place] Barriers to Discharge: Continued Medical Work up  Expected Discharge Plan and Services Expected Discharge Plan: [place] arrangements for the past 2 months: Single Family Home  Readmission Risk Interventions No flowsheet data found.

## 2020-10-27 DIAGNOSIS — K529 Noninfective gastroenteritis and colitis, unspecified: Secondary | ICD-10-CM | POA: Diagnosis not present

## 2020-10-27 DIAGNOSIS — K625 Hemorrhage of anus and rectum: Secondary | ICD-10-CM | POA: Diagnosis not present

## 2020-10-27 DIAGNOSIS — R197 Diarrhea, unspecified: Secondary | ICD-10-CM | POA: Diagnosis not present

## 2020-10-27 DIAGNOSIS — N186 End stage renal disease: Secondary | ICD-10-CM | POA: Diagnosis not present

## 2020-10-27 LAB — TYPE AND SCREEN
ABO/RH(D): A POS
Antibody Screen: NEGATIVE
Unit division: 0

## 2020-10-27 LAB — BPAM RBC
Blood Product Expiration Date: 202112192359
ISSUE DATE / TIME: 202111261743
Unit Type and Rh: 6200

## 2020-10-27 LAB — CBC
HCT: 25.6 % — ABNORMAL LOW (ref 39.0–52.0)
Hemoglobin: 8.3 g/dL — ABNORMAL LOW (ref 13.0–17.0)
MCH: 29.1 pg (ref 26.0–34.0)
MCHC: 32.4 g/dL (ref 30.0–36.0)
MCV: 89.8 fL (ref 80.0–100.0)
Platelets: 281 10*3/uL (ref 150–400)
RBC: 2.85 MIL/uL — ABNORMAL LOW (ref 4.22–5.81)
RDW: 17.9 % — ABNORMAL HIGH (ref 11.5–15.5)
WBC: 11.6 10*3/uL — ABNORMAL HIGH (ref 4.0–10.5)
nRBC: 0 % (ref 0.0–0.2)

## 2020-10-27 NOTE — Progress Notes (Signed)
PROGRESS NOTE  [name] HTD:[id number] DOB: 1951-06-22 DOA: 10/12/2020 PCP: Patient, No Pcp Per  Brief History:  69yo male with history of diabetes mellitus, CKD stage V started recently on dialysis Monday Wednesday Friday and still making urine, recent A. fib and was restarted on amiodarone metoprolol and Eliquis was sent from [facility] center for new syncopal event and orthostasis.  As per report he was discharged last week from [place], he is on hemodialysis center since Monday, he does report syncope on Monday after dialysis, as well he does report orthostasis and near syncope for last couple days which he was started on midodrine by his nephrologist, report this morning prior to dialysis he felt dizzy and lightheaded where he was hypotensive for which they sent him to ED, patient report he is still making good amount of urine daily, reports his weight 2 weeks ago was 29.2 kg, and during most dialysis session was at 76,as well he does report diarrhea x2 weeks, he denies fever, chills or abdominal pain, he does report episodes of vomiting on Wednesday, but no recurrence, overall he reports good oral intake and fluid consumption. Patient was noted to be orthostatic127/67 supine, 71/48 standing with no improvement despite receiving 500 cc x 2 bolus, Significant for potassium of 3.2, sodium of 131, nine 8.2, CT abdomen pelvis significant for wall thickening in the rectal area, with redundant dilated sigmoid colon, but no overt volvulus, so [facility] consulted to admit.\ Nephrology and GI was consulted. Had acute blood loss aded4unit PRBC transfused during this admission. And by GI underwent colonoscopy, poor prep were able to obtain biopsy and patient was monitored closely. Patient was placed on IV steroid with improving diarrhea/rectal bleeding.  Assessment/Plan: Near syncopal events with orthostatic hypotension, on midodrine, recurrent syncope with orthostatic  hypotension: --intravascular volume depletion,blood loss anemia.S/P5unit PRBC total and IV fluids, --10/13/20--EchoEF 72-62%MBTDH 1 diastolic dysfunction. -still orthostatic so received 1 unit PRBC 11/26 with dialysis, no fluid removed with dialysis, --midodrine has been maximized to 10mg  TID--pt was not receiving --Add knee stocking 20 to29mm hg.Monitor orthostatic vitals --consider florinef if ok with renal --may need abd binder  Colitis/rectal bleeding with acute blood loss anemia with recent diarrhea: --chronic diarrhea and recent work-up w/ GI panel and C. difficile are negative from 11/14 and 11/15. --Seen by GI-underwent colonoscopy 11/17-limited exam,colitis of sigmoid and descending colon; blood in rectosigmoid colon --pathologyshowed severely active chronic nonspecific colitis with ulceration and ascending colon, and in the sigmoid and descending colon severely active chronic nonspecific colitis with ulceration. --CompletedCipro/Flagyl x10 days11/23. --was started on Solu-Medrol 60 mg daily 11/19--per GI>>po 40 mg daily--wean by 10 mg per week --11/25 AXR--unchanged gas distension and stool in R colon  Adynamic ileus --CT scan 11/22shows diffuse distention of the colon throughout with air-fluid level, no small bowel obstruction, no infiltrative changes or obstructive lesion identified. --GI and surgery following continue conservative management --"bloated" with carb modified diet but no abd pain --had BM11/24 and 11/25  Acute blood loss anemia --due to rectal bleeding:s/p5units PRBC total this admission --monitor H/H --received one unit 11/26 -discussed with GI, Dr. Redgie Grayer active blood loss presently.  Feels drop in Hgb due to CKD and blood draws  ESRD --started recently on dialysis MWF --HD on 11/26  Recent paroxysmal A. fib --started on amiodarone --Eliquis--holdingdue to his rectal bleeding and acute blood loss anemia --restart  apixaban when/if ok with GI --Patient understands risks and benefits of holding anticoagulation  and he seems to be reluctant to start it again-we will need close follow-up with his cardiologist/PCP. --metoprolol stopped due to orthostasis -monitor for RVR  Hyponatremia, --sodiumis low continue to adjust dialysis.   Hyperlipidemia: --Continue statins.  Status is: Inpatient Remains inpatient appropriate because:IV treatments appropriate due to intensity of illness or inability to take PO Dispo; Patient From: Home Planned Disposition: [facility] Expected discharge date: 10/29/20 Medically stable for discharge: No    Procedures: As Listed in Progress Note Above  Antibiotics: cipro11/13>>11/23 Flagyl 11/13>>11/23    Family Communication:   Significant other at bedside  11/27  Consultants:  GI, renal  Code Status:  FULL   DVT Prophylaxis:  SCDs    RN Pressure Injury Documentation: Pressure Injury 10/13/20 Sacrum Circumferential Stage 2 -  Partial thickness loss of dermis presenting as a shallow open injury with a red, pink wound bed without slough. (Active)  10/13/20 1330  Location: Sacrum  Location Orientation: Circumferential  Staging: Stage 2 -  Partial thickness loss of dermis presenting as a shallow open injury with a red, pink wound bed without slough.  Wound Description (Comments):   Present on Admission: Yes        Subjective: Had loose BM today without blood or melena.  Patient denies fevers, chills, headache, chest pain, dyspnea, nausea, vomiting, diarrhea, abdominal pain, dysuria, hematuria, hematochezia, and melena.   Objective: Vitals:   10/26/20 1930 10/26/20 1945 10/26/20 2145 10/27/20 0450  BP: (!) 143/102 122/78 (!) 165/96 (!) 155/96  Pulse: (!) 120 (!) 130 84 84  Resp:  18 16 18   Temp:  98.2 F (36.8 C) 98.6 F (37 C) 98.6 F (37 C)  TempSrc:  Oral    SpO2:   99% 98%  Weight:      Height:        Intake/Output  Summary (Last 24 hours) at 10/27/2020 1651 Last data filed at 10/27/2020 0900 Gross per 24 hour  Intake 555 ml  Output 87 ml  Net 468 ml   Weight change:  Exam:   General:  Pt is alert, follows commands appropriately, not in acute distress  HEENT: No icterus, No thrush, No neck mass, [place]/AT  Cardiovascular: IRRR, S1/S2, no rubs, no gallops  Respiratory: bibasilar rales. No wheeze  Abdomen: Soft/+BS, non tender, non distended, no guarding  Extremities: No edema, No lymphangitis, No petechiae, No rashes, no synovitis   Data Reviewed: I have personally reviewed following labs and imaging studies Basic Metabolic Panel: Recent Labs  Lab 10/22/20 0617 10/23/20 0507 10/24/20 0551  NA 126* 129* 127*  K 4.3 4.3 4.6  CL 95* 96* 96*  CO2 21* 25 23  GLUCOSE 232* 184* 225*  BUN 46* 32* 47*  CREATININE 4.36* 3.12* 3.96*  CALCIUM 7.7* 7.6* 7.6*   Liver Function Tests: No results for input(s): AST, ALT, ALKPHOS, BILITOT, PROT, ALBUMIN in the last 168 hours. No results for input(s): LIPASE, AMYLASE in the last 168 hours. No results for input(s): AMMONIA in the last 168 hours. Coagulation Profile: No results for input(s): INR, PROTIME in the last 168 hours. CBC: Recent Labs  Lab 10/23/20 0507 10/24/20 0551 10/25/20 0610 10/26/20 0648 10/27/20 0817  WBC 9.2 7.4 7.2 9.0 11.6*  HGB 8.7* 8.0* 7.6* 7.0* 8.3*  HCT 27.6* 25.6* 24.3* 22.0* 25.6*  MCV 90.8 91.8 92.4 92.8 89.8  PLT 307 307 269 270 281   Cardiac Enzymes: No results for input(s): CKTOTAL, CKMB, CKMBINDEX, TROPONINI in the last 168 hours. BNP: Invalid  input(s): POCBNP CBG: No results for input(s): GLUCAP in the last 168 hours. HbA1C: No results for input(s): HGBA1C in the last 72 hours. Urine analysis:    Component Value Date/Time   COLORURINE YELLOW 10/12/2020 1622   APPEARANCEUR CLEAR 10/12/2020 1622   LABSPEC 1.008 10/12/2020 1622   PHURINE 8.0 10/12/2020 1622   GLUCOSEU >=500 (A) 10/12/2020 1622    HGBUR NEGATIVE 10/12/2020 1622   BILIRUBINUR NEGATIVE 10/12/2020 1622   KETONESUR NEGATIVE 10/12/2020 1622   PROTEINUR 100 (A) 10/12/2020 1622   NITRITE NEGATIVE 10/12/2020 1622   LEUKOCYTESUR NEGATIVE 10/12/2020 1622   Sepsis Labs: @LABRCNTIP (procalcitonin:4,lacticidven:4) )No results found for this or any previous visit (from the past 240 hour(s)).   Scheduled Meds: . (feeding supplement) PROSource Plus  30 mL Oral BID BM  . sodium chloride   Intravenous Once  . sodium chloride   Intravenous Once  . sodium chloride   Intravenous Once  . amiodarone  200 mg Oral Daily  . calcitRIOL  0.5 mcg Oral Daily  . Chlorhexidine Gluconate Cloth  6 each Topical Q0600  . Chlorhexidine Gluconate Cloth  6 each Topical Q0600  . feeding supplement  237 mL Oral BID BM  . midodrine  10 mg Oral TID with meals  . multivitamin  1 tablet Oral QHS  . predniSONE  40 mg Oral Q breakfast  . simvastatin  20 mg Oral QPM   Continuous Infusions:  Procedures/Studies: CT ABDOMEN PELVIS WO CONTRAST  Result Date: 10/23/2020 CLINICAL DATA:  Abdominal distension with gaseous distention of bowel on abdominal radiographs. Elevated creatinine. EXAM: CT ABDOMEN AND PELVIS WITHOUT CONTRAST TECHNIQUE: Multidetector CT imaging of the abdomen and pelvis was performed following the standard protocol without IV contrast. COMPARISON:  10/12/2020 FINDINGS: Lower chest: Small bilateral pleural effusions with bilateral basilar atelectasis. Effusions are increasing in size since previous study. Fluid in the distal esophagus suggesting reflux. Hepatobiliary: Normal appearance of the liver. The gallbladder is distended with multiple small stones present. No bile duct dilatation. Pancreas: Unremarkable. No pancreatic ductal dilatation or surrounding inflammatory changes. Spleen: Normal in size without focal abnormality. Adrenals/Urinary Tract: No adrenal gland nodules. Renal parenchyma is atrophic bilaterally. Multiple renal parenchymal  calcifications, largest on the left measuring 5 mm in diameter. No hydronephrosis or hydroureter. No ureteral stones are identified. The bladder is normal. Stomach/Bowel: There is prominent diffuse distention of the colon throughout. Stool is demonstrated in the right hemicolon. Small amount of fluid in the descending colon. Rectal contrast material is present with a rectal tube in place. No wall thickening or inflammatory changes. The small bowel are decompressed. Moderately distended stomach with air-fluid levels. Vascular/Lymphatic: Aortic atherosclerosis. No enlarged abdominal or pelvic lymph nodes. Reproductive: Prostate is unremarkable. Other: No free air or free fluid in the abdomen. Abdominal wall musculature appears intact. Musculoskeletal: Degenerative changes in the spine and hips. No destructive bone lesions. IMPRESSION: 1. Prominent diffuse distention of the colon throughout with air-fluid levels. No evidence of small bowel obstruction. No inflammatory changes or obstructing lesion identified. Changes likely to represent adynamic ileus. 2. Small bilateral pleural effusions with bilateral basilar atelectasis. Effusions are increasing in size since previous study. 3. Fluid in the distal esophagus suggesting reflux. 4. Cholelithiasis with distended gallbladder. 5. Bilateral renal atrophy with multiple renal parenchymal calcifications. No hydronephrosis or hydroureter. 6. Aortic atherosclerosis. Aortic Atherosclerosis (ICD10-I70.0). Electronically Signed   By: Lucienne Capers M.D.   On: 10/23/2020 00:15   CT Abdomen Pelvis Wo Contrast  Result Date: 10/12/2020 CLINICAL DATA:  15 pound weight loss in 2 weeks. Nausea and vomiting. EXAM: CT ABDOMEN AND PELVIS WITHOUT CONTRAST TECHNIQUE: Multidetector CT imaging of the abdomen and pelvis was performed following the standard protocol without IV contrast. COMPARISON:  06/13/2018 FINDINGS: Lower chest: Small bilateral pleural effusions. Contrast medium in the  distal esophagus suggesting dysmotility or reflux. Low-density blood pool suggests anemia. Hepatobiliary: Dependent gallstones in the gallbladder. Trace perihepatic ascites. Pancreas: Punctate calcifications in the pancreas likely reflecting chronic calcific pancreatitis. Spleen: Unremarkable Adrenals/Urinary Tract: There about 8 nonobstructive right renal calculi measuring up to 0.5 cm in diameter. There are about 13 nonobstructive left renal calculi measuring up to 0.6 cm in diameter. No hydronephrosis, hydroureter, or ureteral calculus identified. Adrenal glands unremarkable. Stomach/Bowel: Rectal wall thickening noted with redundant dilated sigmoid colon extending up into the upper abdomen, but without overt volvulus. There is wall thickening in the descending colon. Gas-filled dilated transverse colon. Mild segmental wall thickening in the ascending colon. There is edema in the sigmoid colon mesentery and to a lesser extent in the central bowel mesentery normal appendix. No pneumatosis. Vascular/Lymphatic: Aortoiliac atherosclerotic vascular disease. Reproductive: Mild prostatomegaly. Other: Presacral edema. Mild scattered ascites. Mesenteric edema especially along the sigmoid mesentery. Musculoskeletal: Degenerative disc disease at L2-3 and L5-S1 with endplate irregularity and endplate sclerosis especially at L2-3. This has worsened compared to 06/13/2018. IMPRESSION: 1. Rectal wall thickening with redundant dilated sigmoid colon extending up into the upper abdomen, but without overt volvulus. There is also wall thickening in the descending colon, ascending colon, and sigmoid colon. There is edema in the sigmoid colon mesentery and to a lesser extent in the central bowel mesentery. The appearance is nonspecific but could be due to colitis, inflammatory bowel disease or inflammatory bowel disease; a component of functional colonic abnormalities not excluded given the moderately dilated appearance. 2. Small  bilateral pleural effusions. 3. Low-density blood pool suggests anemia. 4. Cholelithiasis. 5. Bilateral nonobstructive nephrolithiasis. 6. Mild prostatomegaly. 7. Degenerative disc disease at L2-3 and L5-S1 with endplate irregularity and endplate sclerosis especially at L2-3. This has worsened compared to 06/13/2018. 8. Contrast medium in the distal esophagus suggesting dysmotility or reflux. 9. Aortic atherosclerosis. Aortic Atherosclerosis (ICD10-I70.0). Electronically Signed   By: Van Clines M.D.   On: 10/12/2020 19:10   DG Chest 2 View  Result Date: 10/12/2020 CLINICAL DATA:  Near-syncope at dialysis. EXAM: CHEST - 2 VIEW COMPARISON:  09/24/2020 FINDINGS: A new right jugular dialysis catheter terminates over the lower SVC. The cardiomediastinal silhouette is unchanged with normal heart size. There are small pleural effusions with mild posterior basilar airspace opacity. The lungs are otherwise clear. No edema or pneumothorax is identified. No acute osseous abnormality is seen. There is persistent gaseous distension of likely large bowel loops in the upper abdomen, also present on the prior study and incompletely evaluated on these chest radiographs. IMPRESSION: 1. Small bilateral pleural effusions with mild basilar atelectasis. 2. Persistent gaseous distension of colon in the upper abdomen, incompletely evaluated. Electronically Signed   By: Logan Bores M.D.   On: 10/12/2020 14:39   DG Abd Portable 1V  Result Date: 10/25/2020 CLINICAL DATA:  Abdominal distension. EXAM: PORTABLE ABDOMEN - 1 VIEW COMPARISON:  10/22/2020 FINDINGS: There is marked diffuse gaseous distension of the colon the appearance is not significantly changed when compared with 10/22/2020. A large stool burden is identified within the right colon, unchanged. No new findings. IMPRESSION: 1. No change in marked gaseous distension of the colon compatible with colonic ileus. 2. Unchanged large stool  burden within the right colon.  Electronically Signed   By: Kerby Moors M.D.   On: 10/25/2020 08:07   DG Abd Portable 2V  Result Date: 10/22/2020 CLINICAL DATA:  Abdominal distension.  History of bowel obstruction. EXAM: PORTABLE ABDOMEN - 2 VIEW COMPARISON:  CT 10/12/2020.  Radiography 09/24/2020. FINDINGS: Gaseous distension of the intestine. Transverse colon measures up to 12 cm in diameter. Moderate stent shin of the small intestine as well, maximal diameter 3.5 cm. Moderate amount of fecal matter in the right colon. No sign of free air. The appearance could be due to pseudo obstruction, ileus or actual distal colon obstruction. Gaseous distension appears even more pronounced than on the study of 10 days ago. IMPRESSION: Gaseous distension of the intestine, even more pronounced than on the study of 10 days ago. The appearance could be due to pseudo obstruction, ileus or actual distal colon obstruction. Electronically Signed   By: Nelson Chimes M.D.   On: 10/22/2020 13:55   ECHOCARDIOGRAM COMPLETE  Result Date: 10/13/2020    ECHOCARDIOGRAM REPORT   Patient Name:   WINSON EICHORN Date of Exam: 10/13/2020 Medical Rec #:  [id number]   Height:       74.0 in Accession #:    [id number]  Weight:       160.0 lb Date of Birth:  [birth year]   BSA:          1.977 m Patient Age:    40 years    BP:           133/73 mmHg Patient Gender: M           HR:           74 bpm. Exam Location:  [facility] Procedure: 2D Echo, Cardiac Doppler and Color Doppler Indications:    Syncope  History:        Patient has no prior history of Echocardiogram examinations.                 Arrythmias:Atrial Fibrillation, Signs/Symptoms:Syncope; Risk                 Factors:Dyslipidemia. ESRD.  Sonographer:    Dustin Flock RDCS Referring Phys: [place]  1. Left ventricular ejection fraction, by estimation, is 60 to 65%. The left ventricle has normal function. The left ventricle has no regional wall motion abnormalities. Left ventricular  diastolic parameters are consistent with Grade I diastolic dysfunction (impaired relaxation). Elevated left atrial pressure.  2. Right ventricular systolic function is normal. The right ventricular size is normal. There is normal pulmonary artery systolic pressure.  3. The mitral valve is normal in structure. No evidence of mitral valve regurgitation. No evidence of mitral stenosis.  4. The aortic valve is normal in structure. Aortic valve regurgitation is not visualized. No aortic stenosis is present.  5. There is Moderate (Grade III) protruding plaque involving the transverse aorta.  6. The inferior vena cava is normal in size with greater than 50% respiratory variability, suggesting right atrial pressure of 3 mmHg. FINDINGS  Left Ventricle: Left ventricular ejection fraction, by estimation, is 60 to 65%. The left ventricle has normal function. The left ventricle has no regional wall motion abnormalities. The left ventricular internal cavity size was normal in size. There is  no left ventricular hypertrophy. Left ventricular diastolic parameters are consistent with Grade I diastolic dysfunction (impaired relaxation). Elevated left atrial pressure. Right Ventricle: The right ventricular size is normal. No increase in right ventricular  wall thickness. Right ventricular systolic function is normal. There is normal pulmonary artery systolic pressure. The tricuspid regurgitant velocity is 2.55 m/s, and  with an assumed right atrial pressure of 3 mmHg, the estimated right ventricular systolic pressure is 57.8 mmHg. Left Atrium: Left atrial size was normal in size. Right Atrium: Right atrial size was normal in size. Prominent Eustachian valve. Pericardium: There is no evidence of pericardial effusion. Mitral Valve: The mitral valve is normal in structure. No evidence of mitral valve regurgitation. No evidence of mitral valve stenosis. Tricuspid Valve: The tricuspid valve is normal in structure. Tricuspid valve  regurgitation is trivial. No evidence of tricuspid stenosis. Aortic Valve: The aortic valve is normal in structure. Aortic valve regurgitation is not visualized. No aortic stenosis is present. Pulmonic Valve: The pulmonic valve was normal in structure. Pulmonic valve regurgitation is not visualized. No evidence of pulmonic stenosis. Aorta: The aortic root is normal in size and structure. There is moderate (Grade III) protruding plaque involving the transverse aorta. Venous: The inferior vena cava is normal in size with greater than 50% respiratory variability, suggesting right atrial pressure of 3 mmHg. IAS/Shunts: No atrial level shunt detected by color flow Doppler.  LEFT VENTRICLE PLAX 2D LVIDd:         4.96 cm  Diastology LVIDs:         3.66 cm  LV e' medial:    7.29 cm/s LV PW:         1.16 cm  LV E/e' medial:  9.7 LV IVS:        1.17 cm  LV e' lateral:   8.38 cm/s LVOT diam:     2.50 cm  LV E/e' lateral: 8.4 LV SV:         101 LV SV Index:   51 LVOT Area:     4.91 cm  RIGHT VENTRICLE RV Basal diam:  3.10 cm RV S prime:     8.70 cm/s TAPSE (M-mode): 4.2 cm LEFT ATRIUM             Index       RIGHT ATRIUM           Index LA diam:        3.80 cm 1.92 cm/m  RA Area:     19.40 cm LA Vol (A2C):   49.9 ml 25.24 ml/m RA Volume:   53.30 ml  26.96 ml/m LA Vol (A4C):   50.8 ml 25.70 ml/m LA Biplane Vol: 53.0 ml 26.81 ml/m  AORTIC VALVE LVOT Vmax:   99.20 cm/s LVOT Vmean:  67.300 cm/s LVOT VTI:    0.206 m  AORTA Ao Root diam: 3.50 cm MITRAL VALVE               TRICUSPID VALVE MV Area (PHT): 3.66 cm    TR Peak grad:   26.0 mmHg MV Decel Time: 207 msec    TR Vmax:        255.00 cm/s MV E velocity: 70.60 cm/s MV A velocity: 98.50 cm/s  SHUNTS MV E/A ratio:  0.72        Systemic VTI:  0.21 m                            Systemic Diam: 2.50 cm Dani Gobble Croitoru MD Electronically signed by Sanda Klein MD Signature Date/Time: 10/13/2020/1:20:39 PM    Final     Orson Eva, DO  [facility]  If 7PM-7AM,  please  contact night-coverage [email or web address] Password [email or web address] 10/27/2020, 4:51 PM   LOS: 14 days

## 2020-10-27 NOTE — Progress Notes (Signed)
[name], M.D. Gastroenterology & Hepatology   Interval History:  No evidence overnight.  Patient states that he had one episode of watery bowel movements today but no blood in his stool such as melena or hematochezia.  Denies having any abdominal pain or distention, he is passing flatus adequately.  Has had occasional burping but has tolerated diet adequately. Patient received 1 unit PRBC yesterday with adequate response of his hemoglobin, repeat was 8.3.    Inpatient Medications:  Current Facility-Administered Medications:  .  (feeding supplement) PROSource Plus liquid 30 mL, 30 mL, Oral, BID BM, Kc, Ramesh, MD, 30 mL at 10/20/20 0843 .  0.9 %  sodium chloride infusion (Manually program via Guardrails IV Fluids), , Intravenous, Once, Sanford, Ryan B, MD .  0.9 %  sodium chloride infusion (Manually program via Guardrails IV Fluids), , Intravenous, Once, Tat, David, MD .  0.9 %  sodium chloride infusion (Manually program via Guardrails IV Fluids), , Intravenous, Once, Donato Heinz, MD .  alteplase (CATHFLO ACTIVASE) injection 2 mg, 2 mg, Intracatheter, Once PRN, Justin Mend, MD .  alum & mag hydroxide-simeth (MAALOX/MYLANTA) 200-200-20 MG/5ML suspension 30 mL, 30 mL, Oral, Q4H PRN, Kc, Ramesh, MD, 30 mL at 10/24/20 1131 .  amiodarone (PACERONE) tablet 200 mg, 200 mg, Oral, Daily, Elgergawy, Silver Huguenin, MD, 200 mg at 10/26/20 0906 .  calcitRIOL (ROCALTROL) capsule 0.5 mcg, 0.5 mcg, Oral, Daily, Donato Heinz, MD, 0.5 mcg at 10/26/20 0906 .  Chlorhexidine Gluconate Cloth 2 % PADS 6 each, 6 each, Topical, Q0600, Justin Mend, MD, 6 each at 10/24/20 [phone] .  Chlorhexidine Gluconate Cloth 2 % PADS 6 each, 6 each, Topical, Q0600, Edrick Oh, MD, 6 each at 10/26/20 0444 .  feeding supplement (ENSURE ENLIVE / ENSURE PLUS) liquid 237 mL, 237 mL, Oral, BID BM, Kc, Ramesh, MD, 237 mL at 10/26/20 1400 .  heparin injection 3,800 Units, 3,800 Units, Dialysis, PRN, Justin Mend, MD, 3,800 Units at 10/14/20 0045 .  midodrine (PROAMATINE) tablet 10 mg, 10 mg, Oral, TID with meals, Kc, Ramesh, MD, 10 mg at 10/26/20 1754 .  multivitamin (RENA-VIT) tablet 1 tablet, 1 tablet, Oral, QHS, Kc, Ramesh, MD, 1 tablet at 10/26/20 2235 .  ondansetron (ZOFRAN) injection 4 mg, 4 mg, Intravenous, Q8H PRN, Johnson, Clanford L, MD, 4 mg at 10/24/20 1733 .  predniSONE (DELTASONE) tablet 40 mg, 40 mg, Oral, Q breakfast, Rourk, Cristopher Estimable, MD, 40 mg at 10/26/20 0906 .  prochlorperazine (COMPAZINE) injection 10 mg, 10 mg, Intravenous, Q6H PRN, Wynetta Emery, Clanford L, MD, 10 mg at 10/13/20 2046 .  simvastatin (ZOCOR) tablet 20 mg, 20 mg, Oral, QPM, Elgergawy, Silver Huguenin, MD, 20 mg at 10/26/20 1754   I/O    Intake/Output Summary (Last 24 hours) at 10/27/2020 0822 Last data filed at 10/27/2020 0232 Gross per 24 hour  Intake 795 ml  Output 1457 ml  Net -662 ml     Physical Exam: Temp:  [97.3 F (36.3 C)-98.6 F (37 C)] 98.6 F (37 C) (11/27 0450) Pulse Rate:  [72-130] 84 (11/27 0450) Resp:  [16-18] 18 (11/27 0450) BP: (122-170)/(73-113) 155/96 (11/27 0450) SpO2:  [97 %-99 %] 98 % (11/27 0450)  Temp (24hrs), Avg:98.1 F (36.7 C), Min:97.3 F (36.3 C), Max:98.6 F (37 C)  GENERAL: The patient is AO x3, in no acute distress. HEENT: Head is normocephalic and atraumatic. EOMI are intact. Mouth is well hydrated and without lesions. NECK: Supple. No masses LUNGS: Clear to auscultation. No presence of  rhonchi/wheezing/rales. Adequate chest expansion HEART: RRR, normal s1 and s2. ABDOMEN: Soft, nontender, no guarding, no peritoneal signs, and nondistended. BS +. No masses. EXTREMITIES: Without any cyanosis, clubbing, rash, lesions or edema. NEUROLOGIC: AOx3, no focal motor deficit. SKIN: no jaundice, no rashes  Laboratory Data: CBC:     Component Value Date/Time   WBC 9.0 10/26/2020 0648   RBC 2.37 (L) 10/26/2020 0648   HGB 7.0 (L) 10/26/2020 0648   HCT 22.0 (L) 10/26/2020  0648   PLT 270 10/26/2020 0648   MCV 92.8 10/26/2020 0648   MCH 29.5 10/26/2020 0648   MCHC 31.8 10/26/2020 0648   RDW 17.6 (H) 10/26/2020 0648   LYMPHSABS 0.6 (L) 10/05/2020 1850   MONOABS 1.2 (H) 10/05/2020 1850   EOSABS 0.0 10/05/2020 1850   BASOSABS 0.0 10/05/2020 1850   COAG:  Lab Results  Component Value Date   INR 1.1 08/13/2020   INR 1.01 06/08/2018    BMP:  BMP Latest Ref Rng & Units 10/24/2020 10/23/2020 10/22/2020  Glucose 70 - 99 mg/dL 225(H) 184(H) 232(H)  BUN 8 - 23 mg/dL 47(H) 32(H) 46(H)  Creatinine 0.61 - 1.24 mg/dL 3.96(H) 3.12(H) 4.36(H)  Sodium 135 - 145 mmol/L 127(L) 129(L) 126(L)  Potassium 3.5 - 5.1 mmol/L 4.6 4.3 4.3  Chloride 98 - 111 mmol/L 96(L) 96(L) 95(L)  CO2 22 - 32 mmol/L 23 25 21(L)  Calcium 8.9 - 10.3 mg/dL 7.6(L) 7.6(L) 7.7(L)    HEPATIC:  Hepatic Function Latest Ref Rng & Units 10/17/2020 10/16/2020 10/15/2020  Total Protein 6.5 - 8.1 g/dL - - -  Albumin 3.5 - 5.0 g/dL 1.7(L) 1.7(L) 1.8(L)  AST 15 - 41 U/L - - -  ALT 0 - 44 U/L - - -  Alk Phosphatase 38 - 126 U/L - - -  Total Bilirubin 0.3 - 1.2 mg/dL - - -  Bilirubin, Direct 0.0 - 0.2 mg/dL - - -    CARDIAC:  Lab Results  Component Value Date   CKTOTAL 356 06/08/2018      Imaging: I personally reviewed and interpreted the available labs, imaging and endoscopic files.   Assessment/Plan: 69 year old male with past medical history of end-stage renal disease on hemodialysis, A. fib, diabetes, who was admitted to the hospital after presenting worsening diarrhea and rectal bleeding. Patient was found to have colitis on imaging as well as presenting colonic dilation. Underwent colonoscopy which had poor preparation, there was presence of coffee-ground contents and edema, erythema and ulceration up to the descending colon, with biopsies showing severely active chronic colitis with ulceration. He was empirically started on steroids, has presented some improvement of his symptoms for his  colitis. Also received and empiric course of cirpofloxacin and Flagyl. It is unclear why he presented the colitis but infectious sources have been ruled out. At this moment, we will need to continue his steroids and if clinically improving he may be discharged to SNF with need to repeat colonoscopy as outpatient and steroid taper in the next few weeks.  # Colitis # Subacute diarrhea - c/w Prednisone 40 mg qday, plan to decrease prednisone by 10 mg qweek - Continue supportive measures - Will need repeat colonoscopy as outpatient and follow up in GI clinic - Will schedule follow up appointment in 2-3 weeks in GI clinic - GI service will sign-off, please call us back if you have any more questions.  [name] Peppers, MD Gastroenterology and Hepatology [facility]  Note: Occasional unusual wording and randomly placed punctuation marks  may result from the use of speech recognition technology to transcribe this document

## 2020-10-28 DIAGNOSIS — I951 Orthostatic hypotension: Secondary | ICD-10-CM | POA: Diagnosis not present

## 2020-10-28 DIAGNOSIS — N186 End stage renal disease: Secondary | ICD-10-CM | POA: Diagnosis not present

## 2020-10-28 DIAGNOSIS — K529 Noninfective gastroenteritis and colitis, unspecified: Secondary | ICD-10-CM | POA: Diagnosis not present

## 2020-10-28 DIAGNOSIS — K625 Hemorrhage of anus and rectum: Secondary | ICD-10-CM | POA: Diagnosis not present

## 2020-10-28 MED ORDER — METOPROLOL TARTRATE 5 MG/5ML IV SOLN
2.5000 mg | Freq: Four times a day (QID) | INTRAVENOUS | Status: DC | PRN
  Administered 2020-10-28: 2.5 mg via INTRAVENOUS
  Filled 2020-10-28: qty 5

## 2020-10-28 MED ORDER — APIXABAN 5 MG PO TABS
5.0000 mg | ORAL_TABLET | Freq: Two times a day (BID) | ORAL | Status: DC
  Administered 2020-10-28 – 2020-10-29 (×3): 5 mg via ORAL
  Filled 2020-10-28 (×3): qty 1

## 2020-10-28 MED ORDER — APIXABAN 2.5 MG PO TABS: 2.5000 mg | ORAL_TABLET | Freq: Two times a day (BID) | ORAL | Status: DC

## 2020-10-28 NOTE — Progress Notes (Signed)
Patient ID: [name], male   DOB: 09-Feb-1951, 69 y.o.   MRN: [id number]   S: Per chart review, plan for d/c to SNF possibly Monday.  Last HD 11/26 with min UF   O:BP (!) 152/95 (BP Location: Right Arm)   Pulse 99   Temp 97.7 F (36.5 C)   Resp 15   Ht 6\' 2"  (1.88 m)   Wt 79.7 kg   SpO2 95%   BMI 22.56 kg/m   Intake/Output Summary (Last 24 hours) at 10/28/2020 1226 Last data filed at 10/28/2020 0900 Gross per 24 hour  Intake 240 ml  Output 1300 ml  Net -1060 ml   Intake/Output:  Physical exam not performed as chart reviewed remotely I/O last 3 completed shifts: In: 240 [P.O.:240] Out: 1187 [Urine:1500]  Intake/Output this shift:  Total I/O In: 240 [P.O.:240] Out: -  Weight change:    Recent Labs  Lab 10/22/20 0617 10/23/20 0507 10/24/20 0551  NA 126* 129* 127*  K 4.3 4.3 4.6  CL 95* 96* 96*  CO2 21* 25 23  GLUCOSE 232* 184* 225*  BUN 46* 32* 47*  CREATININE 4.36* 3.12* 3.96*  CALCIUM 7.7* 7.6* 7.6*   Liver Function Tests: No results for input(s): AST, ALT, ALKPHOS, BILITOT, PROT, ALBUMIN in the last 168 hours. No results for input(s): LIPASE, AMYLASE in the last 168 hours. No results for input(s): AMMONIA in the last 168 hours. CBC: Recent Labs  Lab 10/23/20 0507 10/23/20 0507 10/24/20 0551 10/24/20 0551 10/25/20 0610 10/26/20 0648 10/27/20 0817  WBC 9.2   < > 7.4   < > 7.2 9.0 11.6*  HGB 8.7*   < > 8.0*   < > 7.6* 7.0* 8.3*  HCT 27.6*   < > 25.6*   < > 24.3* 22.0* 25.6*  MCV 90.8  --  91.8  --  92.4 92.8 89.8  PLT 307   < > 307   < > 269 270 281   < > = values in this interval not displayed.   Cardiac Enzymes: No results for input(s): CKTOTAL, CKMB, CKMBINDEX, TROPONINI in the last 168 hours. CBG: No results for input(s): GLUCAP in the last 168 hours.  Iron Studies: No results for input(s): IRON, TIBC, TRANSFERRIN, FERRITIN in the last 72 hours. Studies/Results: No results found. . (feeding supplement) PROSource Plus  30 mL Oral BID BM  .  sodium chloride   Intravenous Once  . sodium chloride   Intravenous Once  . sodium chloride   Intravenous Once  . amiodarone  200 mg Oral Daily  . calcitRIOL  0.5 mcg Oral Daily  . Chlorhexidine Gluconate Cloth  6 each Topical Q0600  . Chlorhexidine Gluconate Cloth  6 each Topical Q0600  . feeding supplement  237 mL Oral BID BM  . midodrine  10 mg Oral TID with meals  . multivitamin  1 tablet Oral QHS  . predniSONE  40 mg Oral Q breakfast  . simvastatin  20 mg Oral QPM    BMET    Component Value Date/Time   NA 127 (L) 10/24/2020 0551   K 4.6 10/24/2020 0551   CL 96 (L) 10/24/2020 0551   CO2 23 10/24/2020 0551   GLUCOSE 225 (H) 10/24/2020 0551   BUN 47 (H) 10/24/2020 0551   CREATININE 3.96 (H) 10/24/2020 0551   CALCIUM 7.6 (L) 10/24/2020 0551   GFRNONAA 16 (L) 10/24/2020 0551   GFRAA 19 (L) 06/15/2018 0407   CBC    Component Value Date/Time  WBC 11.6 (H) 10/27/2020 0817   RBC 2.85 (L) 10/27/2020 0817   HGB 8.3 (L) 10/27/2020 0817   HCT 25.6 (L) 10/27/2020 0817   PLT 281 10/27/2020 0817   MCV 89.8 10/27/2020 0817   MCH 29.1 10/27/2020 0817   MCHC 32.4 10/27/2020 0817   RDW 17.9 (H) 10/27/2020 0817   LYMPHSABS 0.6 (L) 10/05/2020 1850   MONOABS 1.2 (H) 10/05/2020 1850   EOSABS 0.0 10/05/2020 1850   BASOSABS 0.0 10/05/2020 1850     Assessment/Plan:  1. Syncope/hypotension; sig orthostasis:On TID Midodrine. HD with minimal UF, prob exacerbated by #2.   1. On Prednisone, started yesterday. 2. Keep even with HD--> sig UOP of > 1 L daily. 3. Colitis- GI following and on cipro/flagyl. s/p cscope 11/17: poor prep with incomplete exam, blood in the rectum and sigmoid colon, in the descending colon and at the splenic flexure; colitis involving descending and sigmoid colons seen. Biopsies performed. Per GI and surgery  Prednisone 40 mg daily started 11/27 4. [facility]. HD on schedule, next 11/29 5. Anemia:of CKD- s/p blood transfusion.  No heparin with HD. GI on board, colonoscopy findings as above. Rec 1u prbc 11/22 and 11/26 6. 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 Calcium corrects 7. Nutrition:per primary, push protein 8. H/o Hypertension:see #1 9. Vascular access- will need to f/u with Dr. Donnetta Hutching once stable for discharge. 10. A fib- on amiodarone and eliquis (off A/C as of right now) 11. Hyponatremia. Monitor 12. Disposition- appears like SNF is the recommendation  Madelon Lips MD [facility]

## 2020-10-28 NOTE — Progress Notes (Signed)
   10/28/20 1839  Assess: MEWS Score  Temp 98.3 F (36.8 C)  BP (!) 151/104  Pulse Rate (!) 130  Resp (!) 30  Level of Consciousness Alert  SpO2 94 %  O2 Device Room Air  Patient Activity (if Appropriate) In bed  Assess: MEWS Score  MEWS Temp 0  MEWS Systolic 0  MEWS Pulse 3  MEWS RR 2  MEWS LOC 0  MEWS Score 5  MEWS Score Color Red  Assess: if the MEWS score is Yellow or Red  Were vital signs taken at a resting state? Yes  Focused Assessment Change from prior assessment (see assessment flowsheet)  Early Detection of Sepsis Score *See Row Information* Low  MEWS guidelines implemented *See Row Information* Yes  Treat  MEWS Interventions Administered prn meds/treatments;Consulted Respiratory Therapy;Other (Comment) (MD notified and received orders for Lopressor, 12 lead EKG)  Pain Scale 0-10  Pain Score 0  Take Vital Signs  Increase Vital Sign Frequency  Red: Q 1hr X 4 then Q 4hr X 4, if remains red, continue Q 4hrs  Escalate  MEWS: Escalate Red: discuss with charge nurse/RN and provider, consider discussing with RRT  Notify: Charge Nurse/RN  Name of Charge Nurse/RN Notified L. Aamari Strawderman RN, Maryjean Morn RN   Date Charge Nurse/RN Notified 10/28/20  Time Charge Nurse/RN Notified [id number]  Notify: Provider  Provider Name/Title Dr. Carles Collet  Date Provider Notified 10/28/20  Time Provider Notified [id number]  Notification Type Page  Notification Reason Change in status  Response See new orders  Date of Provider Response 10/28/20  Time of Provider Response 1839  Notify: Rapid Response  Name of Rapid Response RN Notified Maryjean Morn RN  Date Rapid Response Notified 10/28/20  Time Rapid Response Notified [id number]  Document  Patient Outcome Stabilized after interventions  Progress note created (see row info) Yes

## 2020-10-28 NOTE — Progress Notes (Signed)
PROGRESS NOTE  [name] JJO:[id number] DOB: 1951/03/03 DOA: 10/12/2020 PCP: Patient, No Pcp Per  Brief History:  69yo male with history of diabetes mellitus, CKD stage V started recently on dialysis Monday Wednesday Friday and still making urine, recent A. fib and was restarted on amiodarone metoprolol and Eliquis was sent from [facility] center for new syncopal event and orthostasis.  As per report he was discharged last week from [place], he is on hemodialysis center since Monday, he does report syncope on Monday after dialysis, as well he does report orthostasis and near syncope for last couple days which he was started on midodrine by his nephrologist, report this morning prior to dialysis he felt dizzy and lightheaded where he was hypotensive for which they sent him to ED, patient report he is still making good amount of urine daily, reports his weight 2 weeks ago was 29.2 kg, and during most dialysis session was at 76,as well he does report diarrhea x2 weeks, he denies fever, chills or abdominal pain, he does report episodes of vomiting on Wednesday, but no recurrence, overall he reports good oral intake and fluid consumption. Patient was noted to be orthostatic127/67 supine, 71/48 standing with no improvement despite receiving 500 cc x 2 bolus, Significant for potassium of 3.2, sodium of 131, nine 8.2, CT abdomen pelvis significant for wall thickening in the rectal area, with redundant dilated sigmoid colon, but no overt volvulus, so [facility] consulted to admit.\ Nephrology and GI was consulted. Had acute blood loss aded4unit PRBC transfused during this admission. And by GI underwent colonoscopy, poor prep were able to obtain biopsy and patient was monitored closely. Patient was placed on IV steroid with improving diarrhea/rectal bleeding.  Assessment/Plan: Near syncopal events with orthostatic hypotension, on midodrine, recurrent syncope with orthostatic  hypotension: --intravascular volume depletion,blood loss anemia.S/P5unit PRBC total and IV fluids, --10/13/20--EchoEF 16-01%UXNAT 1 diastolic dysfunction. -still orthostatic so received 1 unit PRBC 11/26 with dialysis, no fluid removed with dialysis, --midodrine has been maximized to 10mg  TID--pt was not receiving --Add knee stocking 20 to61mm hg.Monitor orthostatic vitals --consider florinef if ok with renal --may need abd binder  Colitis/rectal bleeding with acute blood loss anemia with recent diarrhea: --chronic diarrhea and recent work-up w/ GI panel and C. difficile are negative from 11/14 and 11/15. --Seen by GI-underwent colonoscopy 11/17-limited exam,colitis of sigmoid and descending colon; blood in rectosigmoid colon --pathologyshowed severely active chronic nonspecific colitis with ulceration and ascending colon, and in the sigmoid and descending colon severely active chronic nonspecific colitis with ulceration. --CompletedCipro/Flagyl x10 days11/23. --was started on Solu-Medrol 60 mg daily 11/19--per GI>>po 40 mg daily--wean by 10 mg per week --11/25 AXR--unchanged gas distension and stool in R colon -11/28--restart apixaban; repeat CBC in am  Adynamic ileus --CT scan 11/22shows diffuse distention of the colon throughout with air-fluid level, no small bowel obstruction, no infiltrative changes or obstructive lesion identified. --GI and surgery following continue conservative management --"bloated" with carb modified diet but no abd pain --having daily BM since 11/24  Acute blood loss anemia --due to rectal bleeding:s/p5units PRBC total this admission --monitor H/H --received one unit 11/26 -discussed with GI, Dr. Redgie Grayer active blood loss presently.  Feels drop in Hgb due to CKD and blood draws --11/28--restart apixaban; repeat CBC in am  ESRD --started recently on dialysis MWF --HD on 11/26  Recent paroxysmal A. fib --started on  amiodarone --Eliquis--holdingdue to his rectal bleeding and acute blood loss anemia --restart  apixaban when/if ok with GI --Patient understands risks and benefits of holding anticoagulation and he seems to be reluctant to start it again-we will need close follow-up with his cardiologist/PCP. --metoprolol stopped due to orthostasis -monitor for RVR  Hyponatremia, --sodiumis low continue to adjust dialysis.   Hyperlipidemia: --Continue statins.  Status is: Inpatient Remains inpatient appropriate because:IV treatments appropriate due to intensity of illness or inability to take PO Dispo; Patient From: Home Planned Disposition: [place] Expected discharge date: 10/29/20 Medically stable for discharge: No    Procedures: As Listed in Progress Note Above  Antibiotics: cipro11/13>>11/23 Flagyl 11/13>>11/23    Family Communication:   Significant other at bedside  11/27  Consultants:  GI, renal  Code Status:  FULL   DVT Prophylaxis:  SCDs    RN Pressure Injury Documentation: Pressure Injury 10/13/20 Sacrum Circumferential Stage 2 -  Partial thickness loss of dermis presenting as a shallow open injury with a red, pink wound bed without slough. (Active)  10/13/20 1330  Location: Sacrum  Location Orientation: Circumferential  Staging: Stage 2 -  Partial thickness loss of dermis presenting as a shallow open injury with a red, pink wound bed without slough.  Wound Description (Comments):   Present on Admission: Yes        Subjective: Had loose BM today without blood or melena.  Patient denies fevers, chills, headache, chest pain, dyspnea, nausea, vomiting, diarrhea, abdominal pain, dysuria, hematuria, hematochezia, and melena.   Objective: Vitals:   10/27/20 0450 10/27/20 2034 10/28/20 0445 10/28/20 0700  BP: (!) 155/96 (!) 166/99 (!) 152/95   Pulse: 84 94 99   Resp: 18 20 15    Temp: 98.6 F (37 C) (!) 97.5 F (36.4 C) 97.7 F (36.5 C)    TempSrc:      SpO2: 98% 98% 95%   Weight:    79.7 kg  Height:        Intake/Output Summary (Last 24 hours) at 10/28/2020 1616 Last data filed at 10/28/2020 1300 Gross per 24 hour  Intake 480 ml  Output 1300 ml  Net -820 ml   Weight change:  Exam:   General:  Pt is alert, follows commands appropriately, not in acute distress  HEENT: No icterus, No thrush, No neck mass, [place]/AT  Cardiovascular: IRRR, S1/S2, no rubs, no gallops  Respiratory: bibasilar rales. No wheeze  Abdomen: Soft/+BS, non tender, non distended, no guarding  Extremities: No edema, No lymphangitis, No petechiae, No rashes, no synovitis   Data Reviewed: I have personally reviewed following labs and imaging studies Basic Metabolic Panel: Recent Labs  Lab 10/22/20 0617 10/23/20 0507 10/24/20 0551  NA 126* 129* 127*  K 4.3 4.3 4.6  CL 95* 96* 96*  CO2 21* 25 23  GLUCOSE 232* 184* 225*  BUN 46* 32* 47*  CREATININE 4.36* 3.12* 3.96*  CALCIUM 7.7* 7.6* 7.6*   Liver Function Tests: No results for input(s): AST, ALT, ALKPHOS, BILITOT, PROT, ALBUMIN in the last 168 hours. No results for input(s): LIPASE, AMYLASE in the last 168 hours. No results for input(s): AMMONIA in the last 168 hours. Coagulation Profile: No results for input(s): INR, PROTIME in the last 168 hours. CBC: Recent Labs  Lab 10/23/20 0507 10/24/20 0551 10/25/20 0610 10/26/20 0648 10/27/20 0817  WBC 9.2 7.4 7.2 9.0 11.6*  HGB 8.7* 8.0* 7.6* 7.0* 8.3*  HCT 27.6* 25.6* 24.3* 22.0* 25.6*  MCV 90.8 91.8 92.4 92.8 89.8  PLT 307 307 269 270 281   Cardiac Enzymes: No results  for input(s): CKTOTAL, CKMB, CKMBINDEX, TROPONINI in the last 168 hours. BNP: Invalid input(s): POCBNP CBG: No results for input(s): GLUCAP in the last 168 hours. HbA1C: No results for input(s): HGBA1C in the last 72 hours. Urine analysis:    Component Value Date/Time   COLORURINE YELLOW 10/12/2020 1622   APPEARANCEUR CLEAR 10/12/2020 1622   LABSPEC  1.008 10/12/2020 1622   PHURINE 8.0 10/12/2020 1622   GLUCOSEU >=500 (A) 10/12/2020 1622   HGBUR NEGATIVE 10/12/2020 1622   BILIRUBINUR NEGATIVE 10/12/2020 1622   KETONESUR NEGATIVE 10/12/2020 1622   PROTEINUR 100 (A) 10/12/2020 1622   NITRITE NEGATIVE 10/12/2020 1622   LEUKOCYTESUR NEGATIVE 10/12/2020 1622   Sepsis Labs: @LABRCNTIP (procalcitonin:4,lacticidven:4) )No results found for this or any previous visit (from the past 240 hour(s)).   Scheduled Meds: . (feeding supplement) PROSource Plus  30 mL Oral BID BM  . sodium chloride   Intravenous Once  . sodium chloride   Intravenous Once  . sodium chloride   Intravenous Once  . amiodarone  200 mg Oral Daily  . apixaban  5 mg Oral BID  . calcitRIOL  0.5 mcg Oral Daily  . Chlorhexidine Gluconate Cloth  6 each Topical Q0600  . Chlorhexidine Gluconate Cloth  6 each Topical Q0600  . feeding supplement  237 mL Oral BID BM  . midodrine  10 mg Oral TID with meals  . multivitamin  1 tablet Oral QHS  . predniSONE  40 mg Oral Q breakfast  . simvastatin  20 mg Oral QPM   Continuous Infusions:  Procedures/Studies: CT ABDOMEN PELVIS WO CONTRAST  Result Date: 10/23/2020 CLINICAL DATA:  Abdominal distension with gaseous distention of bowel on abdominal radiographs. Elevated creatinine. EXAM: CT ABDOMEN AND PELVIS WITHOUT CONTRAST TECHNIQUE: Multidetector CT imaging of the abdomen and pelvis was performed following the standard protocol without IV contrast. COMPARISON:  10/12/2020 FINDINGS: Lower chest: Small bilateral pleural effusions with bilateral basilar atelectasis. Effusions are increasing in size since previous study. Fluid in the distal esophagus suggesting reflux. Hepatobiliary: Normal appearance of the liver. The gallbladder is distended with multiple small stones present. No bile duct dilatation. Pancreas: Unremarkable. No pancreatic ductal dilatation or surrounding inflammatory changes. Spleen: Normal in size without focal  abnormality. Adrenals/Urinary Tract: No adrenal gland nodules. Renal parenchyma is atrophic bilaterally. Multiple renal parenchymal calcifications, largest on the left measuring 5 mm in diameter. No hydronephrosis or hydroureter. No ureteral stones are identified. The bladder is normal. Stomach/Bowel: There is prominent diffuse distention of the colon throughout. Stool is demonstrated in the right hemicolon. Small amount of fluid in the descending colon. Rectal contrast material is present with a rectal tube in place. No wall thickening or inflammatory changes. The small bowel are decompressed. Moderately distended stomach with air-fluid levels. Vascular/Lymphatic: Aortic atherosclerosis. No enlarged abdominal or pelvic lymph nodes. Reproductive: Prostate is unremarkable. Other: No free air or free fluid in the abdomen. Abdominal wall musculature appears intact. Musculoskeletal: Degenerative changes in the spine and hips. No destructive bone lesions. IMPRESSION: 1. Prominent diffuse distention of the colon throughout with air-fluid levels. No evidence of small bowel obstruction. No inflammatory changes or obstructing lesion identified. Changes likely to represent adynamic ileus. 2. Small bilateral pleural effusions with bilateral basilar atelectasis. Effusions are increasing in size since previous study. 3. Fluid in the distal esophagus suggesting reflux. 4. Cholelithiasis with distended gallbladder. 5. Bilateral renal atrophy with multiple renal parenchymal calcifications. No hydronephrosis or hydroureter. 6. Aortic atherosclerosis. Aortic Atherosclerosis (ICD10-I70.0). Electronically Signed   By: Gwyndolyn Saxon  Gerilyn Nestle M.D.   On: 10/23/2020 00:15   CT Abdomen Pelvis Wo Contrast  Result Date: 10/12/2020 CLINICAL DATA:  15 pound weight loss in 2 weeks. Nausea and vomiting. EXAM: CT ABDOMEN AND PELVIS WITHOUT CONTRAST TECHNIQUE: Multidetector CT imaging of the abdomen and pelvis was performed following the standard  protocol without IV contrast. COMPARISON:  06/13/2018 FINDINGS: Lower chest: Small bilateral pleural effusions. Contrast medium in the distal esophagus suggesting dysmotility or reflux. Low-density blood pool suggests anemia. Hepatobiliary: Dependent gallstones in the gallbladder. Trace perihepatic ascites. Pancreas: Punctate calcifications in the pancreas likely reflecting chronic calcific pancreatitis. Spleen: Unremarkable Adrenals/Urinary Tract: There about 8 nonobstructive right renal calculi measuring up to 0.5 cm in diameter. There are about 13 nonobstructive left renal calculi measuring up to 0.6 cm in diameter. No hydronephrosis, hydroureter, or ureteral calculus identified. Adrenal glands unremarkable. Stomach/Bowel: Rectal wall thickening noted with redundant dilated sigmoid colon extending up into the upper abdomen, but without overt volvulus. There is wall thickening in the descending colon. Gas-filled dilated transverse colon. Mild segmental wall thickening in the ascending colon. There is edema in the sigmoid colon mesentery and to a lesser extent in the central bowel mesentery normal appendix. No pneumatosis. Vascular/Lymphatic: Aortoiliac atherosclerotic vascular disease. Reproductive: Mild prostatomegaly. Other: Presacral edema. Mild scattered ascites. Mesenteric edema especially along the sigmoid mesentery. Musculoskeletal: Degenerative disc disease at L2-3 and L5-S1 with endplate irregularity and endplate sclerosis especially at L2-3. This has worsened compared to 06/13/2018. IMPRESSION: 1. Rectal wall thickening with redundant dilated sigmoid colon extending up into the upper abdomen, but without overt volvulus. There is also wall thickening in the descending colon, ascending colon, and sigmoid colon. There is edema in the sigmoid colon mesentery and to a lesser extent in the central bowel mesentery. The appearance is nonspecific but could be due to colitis, inflammatory bowel disease or  inflammatory bowel disease; a component of functional colonic abnormalities not excluded given the moderately dilated appearance. 2. Small bilateral pleural effusions. 3. Low-density blood pool suggests anemia. 4. Cholelithiasis. 5. Bilateral nonobstructive nephrolithiasis. 6. Mild prostatomegaly. 7. Degenerative disc disease at L2-3 and L5-S1 with endplate irregularity and endplate sclerosis especially at L2-3. This has worsened compared to 06/13/2018. 8. Contrast medium in the distal esophagus suggesting dysmotility or reflux. 9. Aortic atherosclerosis. Aortic Atherosclerosis (ICD10-I70.0). Electronically Signed   By: Van Clines M.D.   On: 10/12/2020 19:10   DG Chest 2 View  Result Date: 10/12/2020 CLINICAL DATA:  Near-syncope at dialysis. EXAM: CHEST - 2 VIEW COMPARISON:  09/24/2020 FINDINGS: A new right jugular dialysis catheter terminates over the lower SVC. The cardiomediastinal silhouette is unchanged with normal heart size. There are small pleural effusions with mild posterior basilar airspace opacity. The lungs are otherwise clear. No edema or pneumothorax is identified. No acute osseous abnormality is seen. There is persistent gaseous distension of likely large bowel loops in the upper abdomen, also present on the prior study and incompletely evaluated on these chest radiographs. IMPRESSION: 1. Small bilateral pleural effusions with mild basilar atelectasis. 2. Persistent gaseous distension of colon in the upper abdomen, incompletely evaluated. Electronically Signed   By: Logan Bores M.D.   On: 10/12/2020 14:39   DG Abd Portable 1V  Result Date: 10/25/2020 CLINICAL DATA:  Abdominal distension. EXAM: PORTABLE ABDOMEN - 1 VIEW COMPARISON:  10/22/2020 FINDINGS: There is marked diffuse gaseous distension of the colon the appearance is not significantly changed when compared with 10/22/2020. A large stool burden is identified within the right colon, unchanged. No  new findings. IMPRESSION: 1.  No change in marked gaseous distension of the colon compatible with colonic ileus. 2. Unchanged large stool burden within the right colon. Electronically Signed   By: Kerby Moors M.D.   On: 10/25/2020 08:07   DG Abd Portable 2V  Result Date: 10/22/2020 CLINICAL DATA:  Abdominal distension.  History of bowel obstruction. EXAM: PORTABLE ABDOMEN - 2 VIEW COMPARISON:  CT 10/12/2020.  Radiography 09/24/2020. FINDINGS: Gaseous distension of the intestine. Transverse colon measures up to 12 cm in diameter. Moderate stent shin of the small intestine as well, maximal diameter 3.5 cm. Moderate amount of fecal matter in the right colon. No sign of free air. The appearance could be due to pseudo obstruction, ileus or actual distal colon obstruction. Gaseous distension appears even more pronounced than on the study of 10 days ago. IMPRESSION: Gaseous distension of the intestine, even more pronounced than on the study of 10 days ago. The appearance could be due to pseudo obstruction, ileus or actual distal colon obstruction. Electronically Signed   By: Nelson Chimes M.D.   On: 10/22/2020 13:55   ECHOCARDIOGRAM COMPLETE  Result Date: 10/13/2020    ECHOCARDIOGRAM REPORT   Patient Name:   SHIRAZ BASTYR Date of Exam: 10/13/2020 Medical Rec #:  [id number]   Height:       74.0 in Accession #:    [id number]  Weight:       160.0 lb Date of Birth:  January 18, 1951   BSA:          1.977 m Patient Age:    28 years    BP:           133/73 mmHg Patient Gender: M           HR:           74 bpm. Exam Location:  [facility] Procedure: 2D Echo, Cardiac Doppler and Color Doppler Indications:    Syncope  History:        Patient has no prior history of Echocardiogram examinations.                 Arrythmias:Atrial Fibrillation, Signs/Symptoms:Syncope; Risk                 Factors:Dyslipidemia. ESRD.  Sonographer:    Dustin Flock RDCS Referring Phys: [place]  1. Left ventricular ejection fraction, by estimation, is  60 to 65%. The left ventricle has normal function. The left ventricle has no regional wall motion abnormalities. Left ventricular diastolic parameters are consistent with Grade I diastolic dysfunction (impaired relaxation). Elevated left atrial pressure.  2. Right ventricular systolic function is normal. The right ventricular size is normal. There is normal pulmonary artery systolic pressure.  3. The mitral valve is normal in structure. No evidence of mitral valve regurgitation. No evidence of mitral stenosis.  4. The aortic valve is normal in structure. Aortic valve regurgitation is not visualized. No aortic stenosis is present.  5. There is Moderate (Grade III) protruding plaque involving the transverse aorta.  6. The inferior vena cava is normal in size with greater than 50% respiratory variability, suggesting right atrial pressure of 3 mmHg. FINDINGS  Left Ventricle: Left ventricular ejection fraction, by estimation, is 60 to 65%. The left ventricle has normal function. The left ventricle has no regional wall motion abnormalities. The left ventricular internal cavity size was normal in size. There is  no left ventricular hypertrophy. Left ventricular diastolic parameters are consistent with Grade I  diastolic dysfunction (impaired relaxation). Elevated left atrial pressure. Right Ventricle: The right ventricular size is normal. No increase in right ventricular wall thickness. Right ventricular systolic function is normal. There is normal pulmonary artery systolic pressure. The tricuspid regurgitant velocity is 2.55 m/s, and  with an assumed right atrial pressure of 3 mmHg, the estimated right ventricular systolic pressure is 83.6 mmHg. Left Atrium: Left atrial size was normal in size. Right Atrium: Right atrial size was normal in size. Prominent Eustachian valve. Pericardium: There is no evidence of pericardial effusion. Mitral Valve: The mitral valve is normal in structure. No evidence of mitral valve  regurgitation. No evidence of mitral valve stenosis. Tricuspid Valve: The tricuspid valve is normal in structure. Tricuspid valve regurgitation is trivial. No evidence of tricuspid stenosis. Aortic Valve: The aortic valve is normal in structure. Aortic valve regurgitation is not visualized. No aortic stenosis is present. Pulmonic Valve: The pulmonic valve was normal in structure. Pulmonic valve regurgitation is not visualized. No evidence of pulmonic stenosis. Aorta: The aortic root is normal in size and structure. There is moderate (Grade III) protruding plaque involving the transverse aorta. Venous: The inferior vena cava is normal in size with greater than 50% respiratory variability, suggesting right atrial pressure of 3 mmHg. IAS/Shunts: No atrial level shunt detected by color flow Doppler.  LEFT VENTRICLE PLAX 2D LVIDd:         4.96 cm  Diastology LVIDs:         3.66 cm  LV e' medial:    7.29 cm/s LV PW:         1.16 cm  LV E/e' medial:  9.7 LV IVS:        1.17 cm  LV e' lateral:   8.38 cm/s LVOT diam:     2.50 cm  LV E/e' lateral: 8.4 LV SV:         101 LV SV Index:   51 LVOT Area:     4.91 cm  RIGHT VENTRICLE RV Basal diam:  3.10 cm RV S prime:     8.70 cm/s TAPSE (M-mode): 4.2 cm LEFT ATRIUM             Index       RIGHT ATRIUM           Index LA diam:        3.80 cm 1.92 cm/m  RA Area:     19.40 cm LA Vol (A2C):   49.9 ml 25.24 ml/m RA Volume:   53.30 ml  26.96 ml/m LA Vol (A4C):   50.8 ml 25.70 ml/m LA Biplane Vol: 53.0 ml 26.81 ml/m  AORTIC VALVE LVOT Vmax:   99.20 cm/s LVOT Vmean:  67.300 cm/s LVOT VTI:    0.206 m  AORTA Ao Root diam: 3.50 cm MITRAL VALVE               TRICUSPID VALVE MV Area (PHT): 3.66 cm    TR Peak grad:   26.0 mmHg MV Decel Time: 207 msec    TR Vmax:        255.00 cm/s MV E velocity: 70.60 cm/s MV A velocity: 98.50 cm/s  SHUNTS MV E/A ratio:  0.72        Systemic VTI:  0.21 m                            Systemic Diam: 2.50 cm Dani Gobble Croitoru MD Electronically signed by Sanda Klein  MD Signature Date/Time: 10/13/2020/1:20:39 PM    Final     Orson Eva, DO  [facility]  If 7PM-7AM, please contact night-coverage [email or web address] Password [email or web address] 10/28/2020, 4:16 PM   LOS: 15 days

## 2020-10-29 ENCOUNTER — Inpatient Hospital Stay (HOSPITAL_COMMUNITY): Payer: Medicare Other

## 2020-10-29 ENCOUNTER — Encounter (HOSPITAL_COMMUNITY): Payer: Self-pay | Admitting: Family Medicine

## 2020-10-29 DIAGNOSIS — I4891 Unspecified atrial fibrillation: Secondary | ICD-10-CM | POA: Diagnosis not present

## 2020-10-29 DIAGNOSIS — Z992 Dependence on renal dialysis: Secondary | ICD-10-CM | POA: Diagnosis not present

## 2020-10-29 DIAGNOSIS — I951 Orthostatic hypotension: Secondary | ICD-10-CM | POA: Diagnosis not present

## 2020-10-29 DIAGNOSIS — K529 Noninfective gastroenteritis and colitis, unspecified: Secondary | ICD-10-CM | POA: Diagnosis not present

## 2020-10-29 DIAGNOSIS — I480 Paroxysmal atrial fibrillation: Secondary | ICD-10-CM | POA: Diagnosis not present

## 2020-10-29 DIAGNOSIS — N186 End stage renal disease: Secondary | ICD-10-CM | POA: Diagnosis not present

## 2020-10-29 LAB — CBC
HCT: 30.1 % — ABNORMAL LOW (ref 39.0–52.0)
Hemoglobin: 9.5 g/dL — ABNORMAL LOW (ref 13.0–17.0)
MCH: 28.9 pg (ref 26.0–34.0)
MCHC: 31.6 g/dL (ref 30.0–36.0)
MCV: 91.5 fL (ref 80.0–100.0)
Platelets: 309 10*3/uL (ref 150–400)
RBC: 3.29 MIL/uL — ABNORMAL LOW (ref 4.22–5.81)
RDW: 18.2 % — ABNORMAL HIGH (ref 11.5–15.5)
WBC: 12.1 10*3/uL — ABNORMAL HIGH (ref 4.0–10.5)
nRBC: 0 % (ref 0.0–0.2)

## 2020-10-29 LAB — RESP PANEL BY RT-PCR (FLU A&B, COVID) ARPGX2
Influenza A by PCR: NEGATIVE
Influenza B by PCR: NEGATIVE
SARS Coronavirus 2 by RT PCR: NEGATIVE

## 2020-10-29 LAB — RENAL FUNCTION PANEL
Albumin: 2.3 g/dL — ABNORMAL LOW (ref 3.5–5.0)
Anion gap: 9 (ref 5–15)
BUN: 51 mg/dL — ABNORMAL HIGH (ref 8–23)
CO2: 25 mmol/L (ref 22–32)
Calcium: 8.2 mg/dL — ABNORMAL LOW (ref 8.9–10.3)
Chloride: 96 mmol/L — ABNORMAL LOW (ref 98–111)
Creatinine, Ser: 3.97 mg/dL — ABNORMAL HIGH (ref 0.61–1.24)
GFR, Estimated: 15 mL/min — ABNORMAL LOW (ref >60)
Glucose, Bld: 161 mg/dL — ABNORMAL HIGH (ref 70–99)
Phosphorus: 1.7 mg/dL — ABNORMAL LOW (ref 2.5–4.6)
Potassium: 4.2 mmol/L (ref 3.5–5.1)
Sodium: 130 mmol/L — ABNORMAL LOW (ref 135–145)

## 2020-10-29 LAB — GLUCOSE, CAPILLARY: Glucose-Capillary: 267 mg/dL — ABNORMAL HIGH (ref 70–99)

## 2020-10-29 MED ORDER — METOPROLOL TARTRATE 25 MG PO TABS
12.5000 mg | ORAL_TABLET | Freq: Two times a day (BID) | ORAL | Status: DC
  Administered 2020-10-29 – 2020-11-01 (×5): 12.5 mg via ORAL
  Filled 2020-10-29 (×7): qty 1

## 2020-10-29 MED ORDER — POLYETHYLENE GLYCOL 3350 17 G PO PACK: 17.0000 g | PACK | Freq: Every day | ORAL | Status: DC

## 2020-10-29 MED ORDER — BISACODYL 10 MG RE SUPP: 10.0000 mg | Freq: Once | RECTAL | Status: DC

## 2020-10-29 NOTE — Progress Notes (Signed)
Patient ID: [name], male   DOB: [birth year], 69 y.o.   MRN: [id number]   S:   No interval events  For HD today  Potentially DC today   O:BP (!) 148/96 (BP Location: Right Arm)   Pulse 94   Temp 98.9 F (37.2 C) (Oral)   Resp 18   Ht 6\' 2"  (1.88 m)   Wt 79.7 kg   SpO2 97%   BMI 22.56 kg/m   Intake/Output Summary (Last 24 hours) at 10/29/2020 0925 Last data filed at 10/29/2020 0900 Gross per 24 hour  Intake 240 ml  Output 1050 ml  Net -810 ml   Intake/Output:   I/O last 3 completed shifts: In: 480 [P.O.:480] Out: 1500 [Urine:1500]  Intake/Output this shift:  Total I/O In: -  Out: 50 [Urine:50] Weight change:    NAD RIJ TDC No sig LEE S/nt Regular CTAB Nonfocl EOMI NCAT   Recent Labs  Lab 10/23/20 0507 10/24/20 0551  NA 129* 127*  K 4.3 4.6  CL 96* 96*  CO2 25 23  GLUCOSE 184* 225*  BUN 32* 47*  CREATININE 3.12* 3.96*  CALCIUM 7.6* 7.6*   Liver Function Tests: No results for input(s): AST, ALT, ALKPHOS, BILITOT, PROT, ALBUMIN in the last 168 hours. No results for input(s): LIPASE, AMYLASE in the last 168 hours. No results for input(s): AMMONIA in the last 168 hours. CBC: Recent Labs  Lab 10/24/20 0551 10/24/20 0551 10/25/20 0610 10/25/20 0610 10/26/20 0648 10/27/20 0817 10/29/20 0820  WBC 7.4   < > 7.2   < > 9.0 11.6* 12.1*  HGB 8.0*   < > 7.6*   < > 7.0* 8.3* 9.5*  HCT 25.6*   < > 24.3*   < > 22.0* 25.6* 30.1*  MCV 91.8  --  92.4  --  92.8 89.8 91.5  PLT 307   < > 269   < > 270 281 309   < > = values in this interval not displayed.   Cardiac Enzymes: No results for input(s): CKTOTAL, CKMB, CKMBINDEX, TROPONINI in the last 168 hours. CBG: No results for input(s): GLUCAP in the last 168 hours.  Iron Studies: No results for input(s): IRON, TIBC, TRANSFERRIN, FERRITIN in the last 72 hours. Studies/Results: No results found. . (feeding supplement) PROSource Plus  30 mL Oral BID BM  . sodium chloride   Intravenous Once  . sodium  chloride   Intravenous Once  . sodium chloride   Intravenous Once  . amiodarone  200 mg Oral Daily  . apixaban  5 mg Oral BID  . calcitRIOL  0.5 mcg Oral Daily  . Chlorhexidine Gluconate Cloth  6 each Topical Q0600  . Chlorhexidine Gluconate Cloth  6 each Topical Q0600  . feeding supplement  237 mL Oral BID BM  . midodrine  10 mg Oral TID with meals  . multivitamin  1 tablet Oral QHS  . predniSONE  40 mg Oral Q breakfast  . simvastatin  20 mg Oral QPM    Assessment/Plan:  1. Syncope/hypotension; sig orthostasis:On TID Midodrine. HD with minimal UF, prob exacerbated by #2.  STable but persistent 2. Keep even with HD--> sig UOP of > 1 L daily. 3. Colitis- GI following and on cipro/flagyl. s/p cscope 11/17: poor prep with incomplete exam, blood in the rectum and sigmoid colon, in the descending colon and at the splenic flexure; colitis involving descending and sigmoid colons seen. Biopsies performed. Per GI and surgery  Prednisone 40 mg daily started  11/27 4. [facility] start on MWF at [facility]. HD on schedule today 11/29 5. Anemia:of CKD- s/p blood transfusion. No heparin with HD. GI on board, colonoscopy findings as above. Rec 1u prbc 11/22 and 11/26; Hb stable 6. 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 Calcium corrects 7. Nutrition:per primary, push protein 8. H/o Hypertension:see #1 9. Vascular access- will need to f/u with Dr. Donnetta Hutching once stable for discharge. 10. A fib- on amiodarone and eliquis 11. Hyponatremia. Monitor 12. Disposition- SNF   Rexene Agent  MD [facility]

## 2020-10-29 NOTE — Progress Notes (Signed)
PROGRESS NOTE  [name] YCX:[id number] DOB: March 11, 1951 DOA: 10/12/2020 PCP: Patient, No Pcp Per  Brief History:  69yo male with history of diabetes mellitus, CKD stage V started recently on dialysis Monday Wednesday Friday and still making urine, recent A. fib and was restarted on amiodarone metoprolol and Eliquis was sent from [facility] for new syncopal event and orthostasis.  As per report he was discharged last week from [place], he is on hemodialysis center since Monday, he does report syncope on Monday after dialysis, as well he does report orthostasis and near syncope for last couple days which he was started on midodrine by his nephrologist, report this morning prior to dialysis he felt dizzy and lightheaded where he was hypotensive for which they sent him to ED, patient report he is still making good amount of urine daily, reports his weight 2 weeks ago was 29.2 kg, and during most dialysis session was at 76,as well he does report diarrhea x2 weeks, he denies fever, chills or abdominal pain, he does report episodes of vomiting on Wednesday, but no recurrence, overall he reports good oral intake and fluid consumption. Patient was noted to be orthostatic127/67 supine, 71/48 standing with no improvement despite receiving 500 cc x 2 bolus, Significant for potassium of 3.2, sodium of 131, nine 8.2, CT abdomen pelvis significant for wall thickening in the rectal area, with redundant dilated sigmoid colon, but no overt volvulus, so [facility] consulted to admit.\ Nephrology and GI was consulted. Had acute blood loss aded4unit PRBC transfused during this admission. And by GI underwent colonoscopy, poor prep were able to obtain biopsy and patient was monitored closely. Patient was placed on IV steroid with improving diarrhea/rectal bleeding.  Patient's GI issues overall improved without any further rectal bleeding.  He continued to struggle with adynamic ileus.  For this,  daily cathartics were started and pt was encouraged to get OOB daily.  He continued to have intermittent paroxysms of afib with RVR.  After weighing risks/benefits in the setting of his orthostasis, he was started on low dose metoprolol  Assessment/Plan: Near syncopal events with orthostatic hypotension, on midodrine, recurrent syncope with orthostatic hypotension: --intravascular volume depletion,blood loss anemia.S/P5unit PRBC total and IV fluids, --10/13/20--EchoEF 49-70%YOVZC 1 diastolic dysfunction. -still orthostatic so received 1 unit PRBC 11/26 with dialysis, no fluid removed with dialysis, --midodrine has been maximized to 10mg  TID--pt was not receiving --Add knee stocking 20 to52mm hg.Monitor orthostatic vitals --discussed with renal ([facility])--no easy solutions--hoping he will ultimately equilibrate and improve with HD --may need abd binder  Colitis/rectal bleeding with acute blood loss anemia with recent diarrhea: --chronic diarrhea and recent work-up w/ GI panel and C. difficile are negative from 11/14 and 11/15. --Seen by GI-underwent colonoscopy 11/17-limited exam,colitis of sigmoid and descending colon; blood in rectosigmoid colon --pathologyshowed severely active chronic nonspecific colitis with ulceration and ascending colon, and in the sigmoid and descending colon severely active chronic nonspecific colitis with ulceration. --CompletedCipro/Flagyl x10 days11/23. --was started on Solu-Medrol 60 mg daily 11/19--per GI>>po 40 mg daily--wean by 10 mg per week --11/25 AXR--unchanged gas distension and stool in R colon -11/28--restart apixaban; repeat CBC in am  -11/29--discussed with cards--ok to hold apixaban for now  Adynamic ileus --CT scan 11/22shows diffuse distention of the colon throughout with air-fluid level, no small bowel obstruction, no infiltrative changes or obstructive lesion identified. --GI and surgery following continue conservative  management --"bloated" with carb modified diet but no abd pain --  having daily BM since 11/24 --increase activity-->OOB each shif and daily PT --give bisacodyl x 1  Acute blood loss anemia --due to rectal bleeding:s/p5units PRBC total this admission --monitor H/H --received one unit 11/26 -discussed with GI, Dr. Redgie Grayer active blood loss presently.  Feels drop in Hgb due to CKD and blood draws --11/28--restart apixaban; repeat CBC in am  ESRD --started recently on dialysis MWF --HD on 11/26  Recent paroxysmal A. fib --started on amiodarone --Eliquis--holdingdue to his rectal bleeding and acute blood loss anemia --restart apixaban when/if ok with GI --Patient understands risks and benefits of holding anticoagulation and he seems to be reluctant to start it again-we will need close follow-up with his cardiologist/PCP. --metoprolol stopped due to orthostasis initially --start metoprolol 12.5 mg bid as pt having more afib RVR episodes  Hyponatremia, --sodiumis low continue to adjust dialysis.   Hyperlipidemia: --Continue statins.  Status is: Inpatient Remains inpatient appropriate because:IV treatments appropriate due to intensity of illness or inability to take PO Dispo; Patient From: Home Planned Disposition: [facility] Expected discharge date: 1130/21 Medically stable for discharge: No    Procedures: As Listed in Progress Note Above  Antibiotics: cipro11/13>>11/23 Flagyl 11/13>>11/23    Family Communication:   Significant other at bedside  11/27  Consultants:  GI, renal  Code Status:  FULL   DVT Prophylaxis:  SCDs    RN Pressure Injury Documentation: Pressure Injury 10/13/20 Sacrum Circumferential Stage 2 -  Partial thickness loss of dermis presenting as a shallow open injury with a red, pink wound bed without slough. (Active)  10/13/20 1330  Location: Sacrum  Location Orientation: Circumferential  Staging: Stage 2 -   Partial thickness loss of dermis presenting as a shallow open injury with a red, pink wound bed without slough.  Wound Description (Comments):   Present on Admission: Yes        Subjective: Had loose BM today without blood or melena.  Patient denies fevers, chills, headache, chest pain, dyspnea, nausea, vomiting, diarrhea, abdominal pain, dysuria, hematuria, hematochezia, and melena.   Objective: Vitals:   10/29/20 1415 10/29/20 1445 10/29/20 1515 10/29/20 1545  BP: 106/81 124/89 104/78 124/84  Pulse: 89 91 88 90  Resp:      Temp:      TempSrc:      SpO2:    96%  Weight:      Height:        Intake/Output Summary (Last 24 hours) at 10/29/2020 1729 Last data filed at 10/29/2020 1545 Gross per 24 hour  Intake --  Output 946 ml  Net -946 ml   Weight change:  Exam:   General:  Pt is alert, follows commands appropriately, not in acute distress  HEENT: No icterus, No thrush, No neck mass, Bowen/AT  Cardiovascular: IRRR, S1/S2, no rubs, no gallops  Respiratory: bibasilar rales. No wheeze  Abdomen: Soft/+BS, non tender, non distended, no guarding  Extremities: No edema, No lymphangitis, No petechiae, No rashes, no synovitis   Data Reviewed: I have personally reviewed following labs and imaging studies Basic Metabolic Panel: Recent Labs  Lab 10/23/20 0507 10/24/20 0551 10/29/20 0820  NA 129* 127* 130*  K 4.3 4.6 4.2  CL 96* 96* 96*  CO2 25 23 25   GLUCOSE 184* 225* 161*  BUN 32* 47* 51*  CREATININE 3.12* 3.96* 3.97*  CALCIUM 7.6* 7.6* 8.2*  PHOS  --   --  1.7*   Liver Function Tests: Recent Labs  Lab 10/29/20 0820  ALBUMIN 2.3*  No results for input(s): LIPASE, AMYLASE in the last 168 hours. No results for input(s): AMMONIA in the last 168 hours. Coagulation Profile: No results for input(s): INR, PROTIME in the last 168 hours. CBC: Recent Labs  Lab 10/24/20 0551 10/25/20 0610 10/26/20 0648 10/27/20 0817 10/29/20 0820  WBC 7.4 7.2 9.0 11.6*  12.1*  HGB 8.0* 7.6* 7.0* 8.3* 9.5*  HCT 25.6* 24.3* 22.0* 25.6* 30.1*  MCV 91.8 92.4 92.8 89.8 91.5  PLT 307 269 270 281 309   Cardiac Enzymes: No results for input(s): CKTOTAL, CKMB, CKMBINDEX, TROPONINI in the last 168 hours. BNP: Invalid input(s): POCBNP CBG: Recent Labs  Lab 10/28/20 1844  GLUCAP 267*   HbA1C: No results for input(s): HGBA1C in the last 72 hours. Urine analysis:    Component Value Date/Time   COLORURINE YELLOW 10/12/2020 1622   APPEARANCEUR CLEAR 10/12/2020 1622   LABSPEC 1.008 10/12/2020 1622   PHURINE 8.0 10/12/2020 1622   GLUCOSEU >=500 (A) 10/12/2020 1622   HGBUR NEGATIVE 10/12/2020 1622   BILIRUBINUR NEGATIVE 10/12/2020 1622   KETONESUR NEGATIVE 10/12/2020 1622   PROTEINUR 100 (A) 10/12/2020 1622   NITRITE NEGATIVE 10/12/2020 1622   LEUKOCYTESUR NEGATIVE 10/12/2020 1622   Sepsis Labs: @LABRCNTIP (procalcitonin:4,lacticidven:4) )No results found for this or any previous visit (from the past 240 hour(s)).   Scheduled Meds: . (feeding supplement) PROSource Plus  30 mL Oral BID BM  . amiodarone  200 mg Oral Daily  . bisacodyl  10 mg Rectal Once  . calcitRIOL  0.5 mcg Oral Daily  . Chlorhexidine Gluconate Cloth  6 each Topical Q0600  . feeding supplement  237 mL Oral BID BM  . metoprolol tartrate  12.5 mg Oral BID  . midodrine  10 mg Oral TID with meals  . multivitamin  1 tablet Oral QHS  . predniSONE  40 mg Oral Q breakfast  . simvastatin  20 mg Oral QPM   Continuous Infusions:  Procedures/Studies: CT ABDOMEN PELVIS WO CONTRAST  Result Date: 10/23/2020 CLINICAL DATA:  Abdominal distension with gaseous distention of bowel on abdominal radiographs. Elevated creatinine. EXAM: CT ABDOMEN AND PELVIS WITHOUT CONTRAST TECHNIQUE: Multidetector CT imaging of the abdomen and pelvis was performed following the standard protocol without IV contrast. COMPARISON:  10/12/2020 FINDINGS: Lower chest: Small bilateral pleural effusions with bilateral basilar  atelectasis. Effusions are increasing in size since previous study. Fluid in the distal esophagus suggesting reflux. Hepatobiliary: Normal appearance of the liver. The gallbladder is distended with multiple small stones present. No bile duct dilatation. Pancreas: Unremarkable. No pancreatic ductal dilatation or surrounding inflammatory changes. Spleen: Normal in size without focal abnormality. Adrenals/Urinary Tract: No adrenal gland nodules. Renal parenchyma is atrophic bilaterally. Multiple renal parenchymal calcifications, largest on the left measuring 5 mm in diameter. No hydronephrosis or hydroureter. No ureteral stones are identified. The bladder is normal. Stomach/Bowel: There is prominent diffuse distention of the colon throughout. Stool is demonstrated in the right hemicolon. Small amount of fluid in the descending colon. Rectal contrast material is present with a rectal tube in place. No wall thickening or inflammatory changes. The small bowel are decompressed. Moderately distended stomach with air-fluid levels. Vascular/Lymphatic: Aortic atherosclerosis. No enlarged abdominal or pelvic lymph nodes. Reproductive: Prostate is unremarkable. Other: No free air or free fluid in the abdomen. Abdominal wall musculature appears intact. Musculoskeletal: Degenerative changes in the spine and hips. No destructive bone lesions. IMPRESSION: 1. Prominent diffuse distention of the colon throughout with air-fluid levels. No evidence of small bowel obstruction. No inflammatory changes  or obstructing lesion identified. Changes likely to represent adynamic ileus. 2. Small bilateral pleural effusions with bilateral basilar atelectasis. Effusions are increasing in size since previous study. 3. Fluid in the distal esophagus suggesting reflux. 4. Cholelithiasis with distended gallbladder. 5. Bilateral renal atrophy with multiple renal parenchymal calcifications. No hydronephrosis or hydroureter. 6. Aortic atherosclerosis.  Aortic Atherosclerosis (ICD10-I70.0). Electronically Signed   By: Lucienne Capers M.D.   On: 10/23/2020 00:15   CT Abdomen Pelvis Wo Contrast  Result Date: 10/12/2020 CLINICAL DATA:  15 pound weight loss in 2 weeks. Nausea and vomiting. EXAM: CT ABDOMEN AND PELVIS WITHOUT CONTRAST TECHNIQUE: Multidetector CT imaging of the abdomen and pelvis was performed following the standard protocol without IV contrast. COMPARISON:  06/13/2018 FINDINGS: Lower chest: Small bilateral pleural effusions. Contrast medium in the distal esophagus suggesting dysmotility or reflux. Low-density blood pool suggests anemia. Hepatobiliary: Dependent gallstones in the gallbladder. Trace perihepatic ascites. Pancreas: Punctate calcifications in the pancreas likely reflecting chronic calcific pancreatitis. Spleen: Unremarkable Adrenals/Urinary Tract: There about 8 nonobstructive right renal calculi measuring up to 0.5 cm in diameter. There are about 13 nonobstructive left renal calculi measuring up to 0.6 cm in diameter. No hydronephrosis, hydroureter, or ureteral calculus identified. Adrenal glands unremarkable. Stomach/Bowel: Rectal wall thickening noted with redundant dilated sigmoid colon extending up into the upper abdomen, but without overt volvulus. There is wall thickening in the descending colon. Gas-filled dilated transverse colon. Mild segmental wall thickening in the ascending colon. There is edema in the sigmoid colon mesentery and to a lesser extent in the central bowel mesentery normal appendix. No pneumatosis. Vascular/Lymphatic: Aortoiliac atherosclerotic vascular disease. Reproductive: Mild prostatomegaly. Other: Presacral edema. Mild scattered ascites. Mesenteric edema especially along the sigmoid mesentery. Musculoskeletal: Degenerative disc disease at L2-3 and L5-S1 with endplate irregularity and endplate sclerosis especially at L2-3. This has worsened compared to 06/13/2018. IMPRESSION: 1. Rectal wall thickening with  redundant dilated sigmoid colon extending up into the upper abdomen, but without overt volvulus. There is also wall thickening in the descending colon, ascending colon, and sigmoid colon. There is edema in the sigmoid colon mesentery and to a lesser extent in the central bowel mesentery. The appearance is nonspecific but could be due to colitis, inflammatory bowel disease or inflammatory bowel disease; a component of functional colonic abnormalities not excluded given the moderately dilated appearance. 2. Small bilateral pleural effusions. 3. Low-density blood pool suggests anemia. 4. Cholelithiasis. 5. Bilateral nonobstructive nephrolithiasis. 6. Mild prostatomegaly. 7. Degenerative disc disease at L2-3 and L5-S1 with endplate irregularity and endplate sclerosis especially at L2-3. This has worsened compared to 06/13/2018. 8. Contrast medium in the distal esophagus suggesting dysmotility or reflux. 9. Aortic atherosclerosis. Aortic Atherosclerosis (ICD10-I70.0). Electronically Signed   By: Van Clines M.D.   On: 10/12/2020 19:10   DG Chest 2 View  Result Date: 10/12/2020 CLINICAL DATA:  Near-syncope at dialysis. EXAM: CHEST - 2 VIEW COMPARISON:  09/24/2020 FINDINGS: A new right jugular dialysis catheter terminates over the lower SVC. The cardiomediastinal silhouette is unchanged with normal heart size. There are small pleural effusions with mild posterior basilar airspace opacity. The lungs are otherwise clear. No edema or pneumothorax is identified. No acute osseous abnormality is seen. There is persistent gaseous distension of likely large bowel loops in the upper abdomen, also present on the prior study and incompletely evaluated on these chest radiographs. IMPRESSION: 1. Small bilateral pleural effusions with mild basilar atelectasis. 2. Persistent gaseous distension of colon in the upper abdomen, incompletely evaluated. Electronically Signed  By: Logan Bores M.D.   On: 10/12/2020 14:39   DG Abd  Portable 1V  Result Date: 10/25/2020 CLINICAL DATA:  Abdominal distension. EXAM: PORTABLE ABDOMEN - 1 VIEW COMPARISON:  10/22/2020 FINDINGS: There is marked diffuse gaseous distension of the colon the appearance is not significantly changed when compared with 10/22/2020. A large stool burden is identified within the right colon, unchanged. No new findings. IMPRESSION: 1. No change in marked gaseous distension of the colon compatible with colonic ileus. 2. Unchanged large stool burden within the right colon. Electronically Signed   By: Kerby Moors M.D.   On: 10/25/2020 08:07   DG Abd Portable 2V  Result Date: 10/22/2020 CLINICAL DATA:  Abdominal distension.  History of bowel obstruction. EXAM: PORTABLE ABDOMEN - 2 VIEW COMPARISON:  CT 10/12/2020.  Radiography 09/24/2020. FINDINGS: Gaseous distension of the intestine. Transverse colon measures up to 12 cm in diameter. Moderate stent shin of the small intestine as well, maximal diameter 3.5 cm. Moderate amount of fecal matter in the right colon. No sign of free air. The appearance could be due to pseudo obstruction, ileus or actual distal colon obstruction. Gaseous distension appears even more pronounced than on the study of 10 days ago. IMPRESSION: Gaseous distension of the intestine, even more pronounced than on the study of 10 days ago. The appearance could be due to pseudo obstruction, ileus or actual distal colon obstruction. Electronically Signed   By: Nelson Chimes M.D.   On: 10/22/2020 13:55   ECHOCARDIOGRAM COMPLETE  Result Date: 10/13/2020    ECHOCARDIOGRAM REPORT   Patient Name:   JAIRO BELLEW Date of Exam: 10/13/2020 Medical Rec #:  [id number]   Height:       74.0 in Accession #:    [id number]  Weight:       160.0 lb Date of Birth:  May 12, 1951   BSA:          1.977 m Patient Age:    20 years    BP:           133/73 mmHg Patient Gender: M           HR:           74 bpm. Exam Location:  [facility] Procedure: 2D Echo, Cardiac Doppler and Color  Doppler Indications:    Syncope  History:        Patient has no prior history of Echocardiogram examinations.                 Arrythmias:Atrial Fibrillation, Signs/Symptoms:Syncope; Risk                 Factors:Dyslipidemia. ESRD.  Sonographer:    Dustin Flock RDCS Referring Phys: [place]  1. Left ventricular ejection fraction, by estimation, is 60 to 65%. The left ventricle has normal function. The left ventricle has no regional wall motion abnormalities. Left ventricular diastolic parameters are consistent with Grade I diastolic dysfunction (impaired relaxation). Elevated left atrial pressure.  2. Right ventricular systolic function is normal. The right ventricular size is normal. There is normal pulmonary artery systolic pressure.  3. The mitral valve is normal in structure. No evidence of mitral valve regurgitation. No evidence of mitral stenosis.  4. The aortic valve is normal in structure. Aortic valve regurgitation is not visualized. No aortic stenosis is present.  5. There is Moderate (Grade III) protruding plaque involving the transverse aorta.  6. The inferior vena cava is normal in size with greater  than 50% respiratory variability, suggesting right atrial pressure of 3 mmHg. FINDINGS  Left Ventricle: Left ventricular ejection fraction, by estimation, is 60 to 65%. The left ventricle has normal function. The left ventricle has no regional wall motion abnormalities. The left ventricular internal cavity size was normal in size. There is  no left ventricular hypertrophy. Left ventricular diastolic parameters are consistent with Grade I diastolic dysfunction (impaired relaxation). Elevated left atrial pressure. Right Ventricle: The right ventricular size is normal. No increase in right ventricular wall thickness. Right ventricular systolic function is normal. There is normal pulmonary artery systolic pressure. The tricuspid regurgitant velocity is 2.55 m/s, and  with an  assumed right atrial pressure of 3 mmHg, the estimated right ventricular systolic pressure is 62.6 mmHg. Left Atrium: Left atrial size was normal in size. Right Atrium: Right atrial size was normal in size. Prominent Eustachian valve. Pericardium: There is no evidence of pericardial effusion. Mitral Valve: The mitral valve is normal in structure. No evidence of mitral valve regurgitation. No evidence of mitral valve stenosis. Tricuspid Valve: The tricuspid valve is normal in structure. Tricuspid valve regurgitation is trivial. No evidence of tricuspid stenosis. Aortic Valve: The aortic valve is normal in structure. Aortic valve regurgitation is not visualized. No aortic stenosis is present. Pulmonic Valve: The pulmonic valve was normal in structure. Pulmonic valve regurgitation is not visualized. No evidence of pulmonic stenosis. Aorta: The aortic root is normal in size and structure. There is moderate (Grade III) protruding plaque involving the transverse aorta. Venous: The inferior vena cava is normal in size with greater than 50% respiratory variability, suggesting right atrial pressure of 3 mmHg. IAS/Shunts: No atrial level shunt detected by color flow Doppler.  LEFT VENTRICLE PLAX 2D LVIDd:         4.96 cm  Diastology LVIDs:         3.66 cm  LV e' medial:    7.29 cm/s LV PW:         1.16 cm  LV E/e' medial:  9.7 LV IVS:        1.17 cm  LV e' lateral:   8.38 cm/s LVOT diam:     2.50 cm  LV E/e' lateral: 8.4 LV SV:         101 LV SV Index:   51 LVOT Area:     4.91 cm  RIGHT VENTRICLE RV Basal diam:  3.10 cm RV S prime:     8.70 cm/s TAPSE (M-mode): 4.2 cm LEFT ATRIUM             Index       RIGHT ATRIUM           Index LA diam:        3.80 cm 1.92 cm/m  RA Area:     19.40 cm LA Vol (A2C):   49.9 ml 25.24 ml/m RA Volume:   53.30 ml  26.96 ml/m LA Vol (A4C):   50.8 ml 25.70 ml/m LA Biplane Vol: 53.0 ml 26.81 ml/m  AORTIC VALVE LVOT Vmax:   99.20 cm/s LVOT Vmean:  67.300 cm/s LVOT VTI:    0.206 m  AORTA Ao  Root diam: 3.50 cm MITRAL VALVE               TRICUSPID VALVE MV Area (PHT): 3.66 cm    TR Peak grad:   26.0 mmHg MV Decel Time: 207 msec    TR Vmax:        255.00 cm/s MV E  velocity: 70.60 cm/s MV A velocity: 98.50 cm/s  SHUNTS MV E/A ratio:  0.72        Systemic VTI:  0.21 m                            Systemic Diam: 2.50 cm Dani Gobble Croitoru MD Electronically signed by Sanda Klein MD Signature Date/Time: 10/13/2020/1:20:39 PM    Final     Orson Eva, DO  [facility]  If 7PM-7AM, please contact night-coverage [email or web address] Password [email or web address] 10/29/2020, 5:29 PM   LOS: 16 days

## 2020-10-29 NOTE — Care Management Important Message (Signed)
Important Message  Patient Details  Name: [name] MRN: [id number] Date of Birth: 14-Nov-1951   Medicare Important Message Given:  Yes     [name] 10/29/2020, 4:17 PM

## 2020-10-29 NOTE — Consult Note (Addendum)
Cardiology Consultation:   Patient ID: [name] MRN: [id number]; DOB: October 05, 1951  Admit date: 10/12/2020 Date of Consult: 10/29/2020  Primary Care Provider: Patient, No Pcp Per [name] Cardiologist: Rozann Lesches, MD ([facility] Electrophysiologist:  None     Patient Profile:   [name] is a 69 y.o. male with a hx of DM2, ESRD recently started on HD, and PAF who is being seen today for the evaluation of syncope at the request of Dr. Carles Collet.  History of Present Illness:   [name] is a 69 yr old male patient with history of DM, ESRD recently started on dialysis. Also recent Afib started on amiodarone, metoprolol and eliquis in [place] hospital per patient but notes say [place]. He was unaware of Afib diagnosis and has no history of heart disease. He has had several episodes of syncope and near syncope in setting of orthostatic hypotension caused by GI bleed. Now improved s/p 5 units PRBC's. Echo 10/13/20 LVEF 60-65% grade 1DD.   Past Medical History:  Diagnosis Date  . Diabetes mellitus, type 2 ([place])   . ESRD on hemodialysis ([place])   . GI bleed   . Orthostatic hypotension   . PAF (paroxysmal atrial fibrillation) ([place])     Past Surgical History:  Procedure Laterality Date  . BIOPSY  10/17/2020   Procedure: BIOPSY;  Surgeon: Rogene Houston, MD;  [facility];  Service: Endoscopy;;  ascending and descending  . COLONOSCOPY WITH PROPOFOL N/A 10/17/2020   Procedure: COLONOSCOPY WITH PROPOFOL;  Surgeon: Rogene Houston, MD;  [facility];  Service: Endoscopy;  Laterality: N/A;  . EXTERNAL FIXATION LEG Right 06/08/2018   Procedure: EXTERNAL FIXATION COMMINUTED FIBULA FRACTURE;  Surgeon: Netta Cedars, MD;  Location: [place];  Service: Orthopedics;  Laterality: Right;  . EXTERNAL FIXATION REMOVAL Right 06/11/2018   Procedure: REMOVAL EXTERNAL FIXATION LEG;  Surgeon: Shona Needles, MD;  Location: [place];  Service: Orthopedics;  Laterality:  Right;  . I & D EXTREMITY Right 06/08/2018   Procedure: IRRIGATION AND DEBRIDEMENT OPEN TIBIA FRACTURE;  Surgeon: Netta Cedars, MD;  Location: [place];  Service: Orthopedics;  Laterality: Right;  . I & D EXTREMITY Right 06/11/2018   Procedure: IRRIGATION AND DEBRIDEMENT EXTREMITY;  Surgeon: Shona Needles, MD;  Location: [place];  Service: Orthopedics;  Laterality: Right;  . NOSE SURGERY    . ORIF ANKLE FRACTURE Right 06/08/2018   Procedure: OPEN REDUCTION INTERNAL FIXATION (ORIF) MEDIAL MALLEOLUS;  Surgeon: Netta Cedars, MD;  Location: [place];  Service: Orthopedics;  Laterality: Right;  . ORIF ANKLE FRACTURE Right 06/11/2018   Procedure: OPEN REDUCTION INTERNAL FIXATION (ORIF) ANKLE FRACTURE;  Surgeon: Shona Needles, MD;  Location: [place];  Service: Orthopedics;  Laterality: Right;  . TIBIA DEBRIDEMENT Right 06/08/2018     Home Medications:  Prior to Admission medications   Medication Sig Start Date End Date Taking? Authorizing Provider  amiodarone (PACERONE) 200 MG tablet Take 200 mg by mouth daily. 10/01/20  Yes [provider]  ELIQUIS 2.5 MG TABS tablet Take 2.5 mg by mouth 2 (two) times daily. 10/01/20  Yes [provider]  metoprolol succinate (TOPROL-XL) 25 MG 24 hr tablet Take 25 mg by mouth daily. 10/09/20  Yes [provider]  midodrine (PROAMATINE) 2.5 MG tablet Take 1 tablet by mouth 3 (three) times daily. 10/02/20  Yes [provider]  ondansetron (ZOFRAN) 4 MG tablet Take by mouth. 10/09/20  Yes [provider]  simvastatin (ZOCOR) 20 MG tablet Take 20 mg by mouth every evening. 04/30/18  Yes [provider]    Inpatient Medications: Scheduled Meds: . (feeding supplement) PROSource Plus  30 mL Oral BID BM  . sodium chloride   Intravenous Once  . sodium chloride   Intravenous Once  . sodium chloride   Intravenous Once  . amiodarone  200 mg Oral Daily  . apixaban  5 mg Oral BID  . calcitRIOL  0.5 mcg Oral Daily  . Chlorhexidine  Gluconate Cloth  6 each Topical Q0600  . Chlorhexidine Gluconate Cloth  6 each Topical Q0600  . feeding supplement  237 mL Oral BID BM  . midodrine  10 mg Oral TID with meals  . multivitamin  1 tablet Oral QHS  . predniSONE  40 mg Oral Q breakfast  . simvastatin  20 mg Oral QPM    PRN Meds: alteplase, alum & mag hydroxide-simeth, heparin, metoprolol tartrate, ondansetron (ZOFRAN) IV, prochlorperazine  Allergies:   No Known Allergies  Social History:   Social History   Tobacco Use  . Smoking status: Never Smoker  . Smokeless tobacco: Never Used  Substance Use Topics  . Alcohol use: Yes    Comment: occasional    Family History:      Family History  Problem Relation Age of Onset  . Hypertension Father      ROS:  Please see the history of present illness.  Review of Systems  Constitutional: Negative.  HENT: Negative.   Cardiovascular: Positive for syncope.  Respiratory: Negative.   Endocrine: Negative.   Hematologic/Lymphatic: Negative.   Musculoskeletal: Negative.   Gastrointestinal: Positive for abdominal pain and hematochezia.  Genitourinary: Negative.   Neurological: Positive for dizziness and weakness.   All other ROS reviewed and negative.     Physical Exam/Data:   Vitals:   10/29/20 0700 10/29/20 0800 10/29/20 0900 10/29/20 1000  BP: (!) 146/105 (!) 148/96 (!) 143/100 132/83  Pulse: 98 94 90 91  Resp:      Temp:      TempSrc:      SpO2:      Weight:      Height:        Intake/Output Summary (Last 24 hours) at 10/29/2020 1154 Last data filed at 10/29/2020 0900 Gross per 24 hour  Intake 240 ml  Output 1050 ml  Net -810 ml   Last 3 Weights 10/28/2020 10/25/2020 10/24/2020  Weight (lbs) 175 lb 11.3 oz 174 lb 171 lb 4.8 oz  Weight (kg) 79.7 kg 78.926 kg 77.7 kg     Body mass index is 22.56 kg/m.   General:  Thin, in no acute distress HEENT: normal Lymph: no adenopathy Neck: no JVD Endocrine:  No thryomegaly Vascular: No carotid bruits; FA  pulses 2+ bilaterally without bruits  Cardiac:  normal S1, S2; RRR; no murmur   Lungs:  clear to auscultation bilaterally, no wheezing, rhonchi or rales  Abd: soft, nontender, no hepatomegaly  Ext: no edema Musculoskeletal:  No deformities, BUE and BLE strength normal and equal Skin: warm and dry  Neuro:  CNs 2-12 intact, no focal abnormalities noted Psych:  Normal affect   EKG:  The EKG was personally reviewed and demonstrates:  Afib, poor baseline  Telemetry:  Telemetry was personally reviewed and demonstrates:  Afib on admission, now in NSR  Relevant CV Studies: Echo 10/13/20 IMPRESSIONS    1. Left ventricular ejection fraction, by estimation, is 60 to 65%. The  left ventricle has normal  function. The left ventricle has no regional  wall motion abnormalities. Left ventricular diastolic parameters are  consistent with Grade I diastolic  dysfunction (impaired relaxation). Elevated left atrial pressure.   2. Right ventricular systolic function is normal. The right ventricular  size is normal. There is normal pulmonary artery systolic pressure.   3. The mitral valve is normal in structure. No evidence of mitral valve  regurgitation. No evidence of mitral stenosis.   4. The aortic valve is normal in structure. Aortic valve regurgitation is  not visualized. No aortic stenosis is present.   5. There is Moderate (Grade III) protruding plaque involving the  transverse aorta.   6. The inferior vena cava is normal in size with greater than 50%  respiratory variability, suggesting right atrial pressure of 3 mmHg.    Laboratory Data:  Chemistry Recent Labs  Lab 10/23/20 0507 10/24/20 0551  NA 129* 127*  K 4.3 4.6  CL 96* 96*  CO2 25 23  GLUCOSE 184* 225*  BUN 32* 47*  CREATININE 3.12* 3.96*  CALCIUM 7.6* 7.6*  GFRNONAA 21* 16*  ANIONGAP 8 8    No results for input(s): PROT, ALBUMIN, AST, ALT, ALKPHOS, BILITOT in the last 168 hours. Hematology Recent Labs  Lab  10/26/20 0648 10/27/20 0817 10/29/20 0820  WBC 9.0 11.6* 12.1*  RBC 2.37* 2.85* 3.29*  HGB 7.0* 8.3* 9.5*  HCT 22.0* 25.6* 30.1*  MCV 92.8 89.8 91.5  MCH 29.5 29.1 28.9  MCHC 31.8 32.4 31.6  RDW 17.6* 17.9* 18.2*  PLT 270 281 309    Radiology/Studies:  No results found.   Assessment and Plan:   1. Syncope secondary to orthostatic hypotension, noted in the setting of GI bleed and and volume depletion on dialysis. Now BP trending high after transfusion of 5 units PRBC and IV fluids. Agree with midodrine during dialysis but may not need on standing basis unless orthostasis persists. Can also use compression hose. May need antihypertensive on nondialysis days 2. PAF-patient unaware of diagnosis. Started on Amiodarone and Eliquis while in hospital in [place], [place]. Eliquis on hold b/c of GI bleed. In NSR on Amiodarone. Metoprolol stopped due to orthostasis. Can f/u with [place] as an outpatient unless he has other cardiology follow-up in place. 3. HLD on statin 4. ESRD started on dialysis, missed appointment for graft because he was sick 5. DM managed by PCP     CHA2DS2-VASc Score = 3  This indicates a 3.2% annual risk of stroke. The patient's score is based upon: CHF History: 0 HTN History: 0 Diabetes History: 1 Stroke History: 0 Vascular Disease History: 1 Age Score: 1 Gender Score: 0    [facility] will sign off.   Medication Recommendations:  Continue Amiodarone, midodrine prn  Other recommendations (labs, testing, etc):    Follow up as an outpatient:  F/u with Dr. Domenic Polite in [place] For questions or updates, please contact [place] Please consult [email or web address] for contact info under    Signed, Ermalinda Barrios, PA-C 10/29/2020 11:54 AM    Attending note:  Patient seen and examined.  I reviewed the available records and discussed the case with Ms. Vita Barley, I agree with her above findings.  Mr. Carrozza is currently admitted to the hospital with recurrent  lightheadedness and near syncope related to orthostasis.  He has been found to have evidence of volume contraction complicated by GI bleed, also recently started on hemodialysis due to ESRD.Marland Kitchen  He has a history of paroxysmal atrial fibrillation diagnosed relatively  recently at a facility in [place] (either [facility] or [facility]) and was placed on amiodarone along with beta-blocker and Eliquis for stroke prophylaxis.  He is unaware of this diagnosis.  Unclear if he has had cardiology follow-up established as an outpatient.  His PCP is Dr. Sherrie Sport in [place].  On examination he is in no distress.  Blood pressures have been elevated with systolics in the 867Y to 195K, heart rate in the 90s and in sinus rhythm by telemetry.  Cardiac exam without gallop, systolic murmur noted.  Pertinent lab work includes WBC 12.1, hemoglobin 9.5 up from 7.0 on November 26, platelets 309, potassium 4.6, creatinine 3.96, sodium 127.  Echocardiogram reports LVEF 60 to 65% with mild diastolic dysfunction, normal left atrial chamber size.  I personally reviewed his ECG from November 28 which shows rapid atrial fibrillation with LVH and repolarization abnormalities.  Paroxysmal atrial fibrillation, most recently in sinus rhythm and with CHA2DS2-VASc score of 3.  He is on amiodarone 200 mg daily, beta-blocker and Eliquis have been held with recent orthostasis and evidence of GI bleed.  TSH and LFTs are normal.  Blood pressure has improved, orthostatic measurements were better as of November 25.  Suggest continuing amiodarone 200 mg daily, resume low-dose Toprol-XL beginning at 12.5 mg daily.  Would continue to hold off on resuming Eliquis, this can be reconsidered as an outpatient presuming he remains stable and is in agreement with restarting.  Continue to track blood pressure with hemodialysis, midodrine could be used as needed.  No additional inpatient cardiac testing is planned, he can be established with our practice following  discharge unless he already has a cardiology provider for follow-up.  Satira Sark, M.D., F.A.C.C.

## 2020-10-29 NOTE — Procedures (Signed)
   HEMODIALYSIS TREATMENT NOTE:   3 hour heparin-free HD completed.  No UF; unable to tolerate even 150cc/h for prime volume.  Net UF +344cc.

## 2020-10-30 DIAGNOSIS — I951 Orthostatic hypotension: Secondary | ICD-10-CM | POA: Diagnosis not present

## 2020-10-30 DIAGNOSIS — Z992 Dependence on renal dialysis: Secondary | ICD-10-CM | POA: Diagnosis not present

## 2020-10-30 DIAGNOSIS — N186 End stage renal disease: Secondary | ICD-10-CM | POA: Diagnosis not present

## 2020-10-30 DIAGNOSIS — K529 Noninfective gastroenteritis and colitis, unspecified: Secondary | ICD-10-CM | POA: Diagnosis not present

## 2020-10-30 MED ORDER — PREDNISONE 20 MG PO TABS
30.0000 mg | ORAL_TABLET | Freq: Every day | ORAL | Status: DC
  Filled 2020-10-30: qty 1

## 2020-10-30 NOTE — Progress Notes (Addendum)
Physical Therapy Treatment Patient Details Name: [name] MRN: [id number] DOB: August 12, 1951 Today's Date: 10/30/2020    History of Present Illness [name]  is a 69 y.o. male, with past medical history of diabetes mellitus, CKD stage V, recent hospitalization at [facility] for which he started hemodialysis for ESRD on MWF schedule, and diagnosed with A. fib when he started on amiodarone, metoprolol and Eliquis, patient was sent from hemodialysis center for near syncope and orthostasis, patient reports he was discharged last week from [place], he is on hemodialysis center since Monday, he does report syncope on Monday after dialysis, as well he does report orthostasis and near syncope for last couple days which he was started on midodrine by his nephrologist, report this morning prior to dialysis he felt dizzy and lightheaded where he was hypotensive for which they sent him to ED, patient report he is still making good amount of urine daily, reports his weight 2 weeks ago was 29.2 kg, and during most dialysis session was at 76, as well he does report diarrhea x2 weeks, he denies fever, chills or abdominal pain, he does report episodes of vomiting on Wednesday, but no recurrence, overall he reports good oral intake and fluid consumption.    PT Comments    Pt agreeable to therapy. Pt able to sit at EOB with slightly increased time, no physical assist or use of bedrail to rise or return to supine. Once sitting, pt became increasingly lightheaded with significant BP decrease after ~3 minutes sitting so assisted back to supine. Pt able to perform ankle pumps appropriately and declines additional exercises. Pt reports symptoms resolve once supine. Therapist educated pt on exercises to perform and benefit to maintain strength and pt verbalized understanding. Nursing student in room writing down pt's BPs during session to notify RN and remains in room after therapist left. Pt will benefit from continued  physical therapy in hospital and recommendations below to increase strength, balance, endurance for safe ADLs and gait.    Follow Up Recommendations  SNF     Equipment Recommendations  Rolling walker with 5" wheels    Recommendations for Other Services       Precautions / Restrictions Precautions Precautions: Fall;Other (comment) Precaution Comments: monitor BP Restrictions Weight Bearing Restrictions: No    Mobility  Bed Mobility Overal bed mobility: Modified Independent  General bed mobility comments: slightly increased time with supine<>sit, no use of bedrail and bed flat  Transfers  General transfer comment: not attempted due to BP drop in sitting  Ambulation/Gait  General Gait Details: not attempted due to BP drop in sitting   Stairs             Wheelchair Mobility    Modified Rankin (Stroke Patients Only)       Balance             Cognition Arousal/Alertness: Awake/alert Behavior During Therapy: WFL for tasks assessed/performed Overall Cognitive Status: Within Functional Limits for tasks assessed       Exercises General Exercises - Lower Extremity Ankle Circles/Pumps: AROM;Both;Supine;20 reps    General Comments        Pertinent Vitals/Pain Pain Assessment: No/denies pain    Home Living                      Prior Function            PT Goals (current goals can now be found in the care plan section) Acute Rehab PT Goals  Patient Stated Goal: return home after rehab PT Goal Formulation: With patient Time For Goal Achievement: 10/29/20 Potential to Achieve Goals: Good Progress towards PT goals: Not progressing toward goals - comment (limited due to orthostatic BP)    Frequency    Min 3X/week      PT Plan Current plan remains appropriate    Co-evaluation              AM-PAC PT "6 Clicks" Mobility   Outcome Measure  Help needed turning from your back to your side while in a flat bed without using  bedrails?: None Help needed moving from lying on your back to sitting on the side of a flat bed without using bedrails?: None Help needed moving to and from a bed to a chair (including a wheelchair)?: A Little Help needed standing up from a chair using your arms (e.g., wheelchair or bedside chair)?: A Little Help needed to walk in hospital room?: A Little Help needed climbing 3-5 steps with a railing? : A Lot 6 Click Score: 19    End of Session   Activity Tolerance: Treatment limited secondary to medical complications (Comment) (orthostatic BP) Patient left: in bed;with call bell/phone within reach;with nursing/sitter in room Nurse Communication: Mobility status;Other (comment) (BP) PT Visit Diagnosis: Unsteadiness on feet (R26.81);Other abnormalities of gait and mobility (R26.89);Muscle weakness (generalized) (M62.81)     Time: 1055-1110 PT Time Calculation (min) (ACUTE ONLY): 15 min  Charges:  $Therapeutic Exercise: 8-22 mins                      Tori Kaity Pitstick PT, DPT 10/30/20, 1:16 PM [phone]

## 2020-10-30 NOTE — Progress Notes (Addendum)
PROGRESS NOTE  [name] ASN:[id number] DOB: 1950-12-07 DOA: 10/12/2020 PCP: Patient, No Pcp Per  Brief History:  69yo male with history of diabetes mellitus, CKD stage V started recently on dialysis Monday Wednesday Friday and still making urine, recent A. fib and was restarted on amiodarone metoprolol and Eliquis was sent from [facility] center for new syncopal event and orthostasis.  As per report he was discharged last week from [place], he is on hemodialysis center since Monday, he does report syncope on Monday after dialysis, as well he does report orthostasis and near syncope for last couple days which he was started on midodrine by his nephrologist, report this morning prior to dialysis he felt dizzy and lightheaded where he was hypotensive for which they sent him to ED, patient report he is still making good amount of urine daily, reports his weight 2 weeks ago was 29.2 kg, and during most dialysis session was at 76,as well he does report diarrhea x2 weeks, he denies fever, chills or abdominal pain, he does report episodes of vomiting on Wednesday, but no recurrence, overall he reports good oral intake and fluid consumption. Patient was noted to be orthostatic127/67 supine, 71/48 standing with no improvement despite receiving 500 cc x 2 bolus, Significant for potassium of 3.2, sodium of 131, nine 8.2, CT abdomen pelvis significant for wall thickening in the rectal area, with redundant dilated sigmoid colon, but no overt volvulus, so [facility] hospitalist consulted to admit.\ Nephrology and GI was consulted. Had acute blood loss aded4unit PRBC transfused during this admission. And by GI underwent colonoscopy, poor prep were able to obtain biopsy and patient was monitored closely. Patient was placed on IV steroid with improving diarrhea/rectal bleeding.  Patient's GI issues overall improved without any further rectal bleeding.  He continued to struggle with adynamic ileus.  For this,  daily cathartics were started and pt was encouraged to get OOB daily.  With this, he is now having daily BMs.  He continued to have intermittent paroxysms of afib with RVR.  After weighing risks/benefits in the setting of his orthostasis, he was started on low dose metoprolol which has decreased his episodes of Afib.  The last problem remains inability to remove fluid on HD due to hypotension.  10/29/20 CXR showed edema, but pt remains stable on RA with saturation 93-94%.  If renal feels comfortable with his HD issues, he can possibly d/c 10/31/20  Assessment/Plan: Near syncopal events with orthostatic hypotension, on midodrine, recurrent syncope with orthostatic hypotension: --intravascular volume depletion,blood loss anemia.S/P5unit PRBC total and IV fluids, --10/13/20--EchoEF 19-37%TKWIO 1 diastolic dysfunction. -still orthostatic so received 1 unit PRBC 11/26 with dialysis, no fluid removed with dialysis, --midodrine has been maximized to 10mg  TID--pt was not receiving --Add knee stocking 20 to48mm hg.Monitor orthostatic vitals --discussed with renal ([facility])--no easy solutions--hoping he will ultimately equilibrate and improve with HD --may need abd binder  Colitis/rectal bleeding with acute blood loss anemia with recent diarrhea: --chronic diarrhea and recent work-up w/ GI panel and C. difficile are negative from 11/14 and 11/15. --Seen by GI-underwent colonoscopy 11/17-limited exam,colitis of sigmoid and descending colon; blood in rectosigmoid colon --pathologyshowed severely active chronic nonspecific colitis with ulceration and ascending colon, and in the sigmoid and descending colon severely active chronic nonspecific colitis with ulceration. --CompletedCipro/Flagyl x10 days11/23. --was started on Solu-Medrol 60 mg daily 11/19--per GI>>po 40 mg daily--wean by 10 mg per week--start 30 mg daily on 11/02/20 --11/25 AXR--unchanged gas distension  and stool in R  colon -11/28--restart apixaban; repeat CBC in am  -11/29--discussed with cards--ok to hold apixaban for now  Adynamic ileus --CT scan 11/22shows diffuse distention of the colon throughout with air-fluid level, no small bowel obstruction, no infiltrative changes or obstructive lesion identified. --GI and surgery following continue conservative management --"bloated" with carb modified diet but no abd pain --having daily BM since 11/24 --increase activity-->OOB each shif and daily PT  Acute blood loss anemia --due to rectal bleeding:s/p5units PRBC total this admission --monitor H/H --received one unit 11/26 -discussed with GI, Dr. Redgie Grayer active blood loss presently.  Feels drop in Hgb due to CKD and blood draws --cardiology ok with stopping apixaban temporarily for now  ESRD --started recently on dialysis MWF --HD on 11/26 --having intradialytic hypotension already on midodrine-->having difficulty removing fluid  Recent paroxysmal A. fib --started on amiodarone --Eliquis--holdingdue to his rectal bleeding and acute blood loss anemia --restart apixaban when/if ok with GI --Patient understands risks and benefits of holding anticoagulation and he seems to be reluctant to start it again-we will need close follow-up with his cardiologist/PCP. --metoprolol stopped due to orthostasis initially --start metoprolol 12.5 mg bid as pt having more afib RVR episodes  Hyponatremia, --sodiumis low continue to adjust dialysis.   Hyperlipidemia: --Continue statins.  Status is: Inpatient Remains inpatient appropriate because:IV treatments appropriate due to intensity of illness or inability to take PO Dispo; Patient From: Home Planned Disposition: [place] Expected discharge date: 1130/21 Medically stable for discharge: No    Procedures: As Listed in Progress Note Above  Antibiotics: cipro11/13>>11/23 Flagyl 11/13>>11/23    Family  Communication:   Significant other at bedside  11/27  Consultants:  GI, renal  Code Status:  FULL   DVT Prophylaxis:  SCDs    RN Pressure Injury Documentation: Pressure Injury 10/13/20 Sacrum Circumferential Stage 2 -  Partial thickness loss of dermis presenting as a shallow open injury with a red, pink wound bed without slough. (Active)  10/13/20 1330  Location: Sacrum  Location Orientation: Circumferential  Staging: Stage 2 -  Partial thickness loss of dermis presenting as a shallow open injury with a red, pink wound bed without slough.  Wound Description (Comments):   Present on Admission: Yes        Subjective: Had loose BM today without blood or melena.  Patient denies fevers, chills, headache, chest pain, dyspnea, nausea, vomiting, diarrhea, abdominal pain, dysuria, hematuria, hematochezia, and melena.   Objective: Vitals:   10/30/20 0700 10/30/20 0900 10/30/20 1100 10/30/20 1302  BP: 136/79 (!) 145/85  121/70  Pulse:  (!) 103  85  Resp: 19 20  19   Temp: 97.7 F (36.5 C) 98.7 F (37.1 C)  99.2 F (37.3 C)  TempSrc: Oral Oral  Oral  SpO2: 96% 94% 94% 92%  Weight:  76.1 kg    Height:        Intake/Output Summary (Last 24 hours) at 10/30/2020 1626 Last data filed at 10/30/2020 1320 Gross per 24 hour  Intake --  Output 750 ml  Net -750 ml   Weight change:  Exam:   General:  Pt is alert, follows commands appropriately, not in acute distress  HEENT: No icterus, No thrush, No neck mass, [place]/AT  Cardiovascular: RRR, S1/S2, no rubs, no gallops  Respiratory: bibasilar rales. No wheeze  Abdomen: Soft/+BS, non tender, non distended, no guarding  Extremities: No edema, No lymphangitis, No petechiae, No rashes, no synovitis   Data Reviewed: I have personally reviewed following  labs and imaging studies Basic Metabolic Panel: Recent Labs  Lab 10/24/20 0551 10/29/20 0820  NA 127* 130*  K 4.6 4.2  CL 96* 96*  CO2 23 25  GLUCOSE 225* 161*  BUN 47* 51*   CREATININE 3.96* 3.97*  CALCIUM 7.6* 8.2*  PHOS  --  1.7*   Liver Function Tests: Recent Labs  Lab 10/29/20 0820  ALBUMIN 2.3*   No results for input(s): LIPASE, AMYLASE in the last 168 hours. No results for input(s): AMMONIA in the last 168 hours. Coagulation Profile: No results for input(s): INR, PROTIME in the last 168 hours. CBC: Recent Labs  Lab 10/24/20 0551 10/25/20 0610 10/26/20 0648 10/27/20 0817 10/29/20 0820  WBC 7.4 7.2 9.0 11.6* 12.1*  HGB 8.0* 7.6* 7.0* 8.3* 9.5*  HCT 25.6* 24.3* 22.0* 25.6* 30.1*  MCV 91.8 92.4 92.8 89.8 91.5  PLT 307 269 270 281 309   Cardiac Enzymes: No results for input(s): CKTOTAL, CKMB, CKMBINDEX, TROPONINI in the last 168 hours. BNP: Invalid input(s): POCBNP CBG: Recent Labs  Lab 10/28/20 1844  GLUCAP 267*   HbA1C: No results for input(s): HGBA1C in the last 72 hours. Urine analysis:    Component Value Date/Time   COLORURINE YELLOW 10/12/2020 1622   APPEARANCEUR CLEAR 10/12/2020 1622   LABSPEC 1.008 10/12/2020 1622   PHURINE 8.0 10/12/2020 1622   GLUCOSEU >=500 (A) 10/12/2020 1622   HGBUR NEGATIVE 10/12/2020 1622   BILIRUBINUR NEGATIVE 10/12/2020 1622   KETONESUR NEGATIVE 10/12/2020 1622   PROTEINUR 100 (A) 10/12/2020 1622   NITRITE NEGATIVE 10/12/2020 1622   LEUKOCYTESUR NEGATIVE 10/12/2020 1622   Sepsis Labs: @LABRCNTIP (procalcitonin:4,lacticidven:4) ) Recent Results (from the past 240 hour(s))  Resp Panel by RT-PCR (Flu A&B, Covid) Nasopharyngeal Swab     Status: None   Collection Time: 10/29/20  3:03 PM   Specimen: Nasopharyngeal Swab; Nasopharyngeal(NP) swabs in vial transport medium  Result Value Ref Range Status   SARS Coronavirus 2 by RT PCR NEGATIVE NEGATIVE Final    Comment: (NOTE) SARS-CoV-2 target nucleic acids are NOT DETECTED.  The SARS-CoV-2 RNA is generally detectable in upper respiratory specimens during the acute phase of infection. The lowest concentration of SARS-CoV-2 viral copies this  assay can detect is 138 copies/mL. A negative result does not preclude SARS-Cov-2 infection and should not be used as the sole basis for treatment or other patient management decisions. A negative result may occur with  improper specimen collection/handling, submission of specimen other than nasopharyngeal swab, presence of viral mutation(s) within the areas targeted by this assay, and inadequate number of viral copies(<138 copies/mL). A negative result must be combined with clinical observations, patient history, and epidemiological information. The expected result is Negative.  Fact Sheet for Patients:  EntrepreneurPulse.com.au  Fact Sheet for Healthcare Providers:  IncredibleEmployment.be  This test is no t yet approved or cleared by the [place] FDA and  has been authorized for detection and/or diagnosis of SARS-CoV-2 by FDA under an Emergency Use Authorization (EUA). This EUA will remain  in effect (meaning this test can be used) for the duration of the COVID-19 declaration under Section 564(b)(1) of the Act, 21 U.S.C.section 360bbb-3(b)(1), unless the authorization is terminated  or revoked sooner.       Influenza A by PCR NEGATIVE NEGATIVE Final   Influenza B by PCR NEGATIVE NEGATIVE Final    Comment: (NOTE) The Xpert Xpress SARS-CoV-2/FLU/RSV plus assay is intended as an aid in the diagnosis of influenza from Nasopharyngeal swab specimens and should not be used  as a sole basis for treatment. Nasal washings and aspirates are unacceptable for Xpert Xpress SARS-CoV-2/FLU/RSV testing.  Fact Sheet for Patients: EntrepreneurPulse.com.au  Fact Sheet for Healthcare Providers: IncredibleEmployment.be  This test is not yet approved or cleared by the [place] FDA and has been authorized for detection and/or diagnosis of SARS-CoV-2 by FDA under an Emergency Use Authorization (EUA). This EUA will  remain in effect (meaning this test can be used) for the duration of the COVID-19 declaration under Section 564(b)(1) of the Act, 21 U.S.C. section 360bbb-3(b)(1), unless the authorization is terminated or revoked.  Performed at [facility], [address], [place], [place] [address]      Scheduled Meds: . (feeding supplement) PROSource Plus  30 mL Oral BID BM  . amiodarone  200 mg Oral Daily  . bisacodyl  10 mg Rectal Once  . calcitRIOL  0.5 mcg Oral Daily  . Chlorhexidine Gluconate Cloth  6 each Topical Q0600  . feeding supplement  237 mL Oral BID BM  . metoprolol tartrate  12.5 mg Oral BID  . midodrine  10 mg Oral TID with meals  . multivitamin  1 tablet Oral QHS  . polyethylene glycol  17 g Oral Daily  . predniSONE  40 mg Oral Q breakfast  . simvastatin  20 mg Oral QPM   Continuous Infusions:  Procedures/Studies: CT ABDOMEN PELVIS WO CONTRAST  Result Date: 10/23/2020 CLINICAL DATA:  Abdominal distension with gaseous distention of bowel on abdominal radiographs. Elevated creatinine. EXAM: CT ABDOMEN AND PELVIS WITHOUT CONTRAST TECHNIQUE: Multidetector CT imaging of the abdomen and pelvis was performed following the standard protocol without IV contrast. COMPARISON:  10/12/2020 FINDINGS: Lower chest: Small bilateral pleural effusions with bilateral basilar atelectasis. Effusions are increasing in size since previous study. Fluid in the distal esophagus suggesting reflux. Hepatobiliary: Normal appearance of the liver. The gallbladder is distended with multiple small stones present. No bile duct dilatation. Pancreas: Unremarkable. No pancreatic ductal dilatation or surrounding inflammatory changes. Spleen: Normal in size without focal abnormality. Adrenals/Urinary Tract: No adrenal gland nodules. Renal parenchyma is atrophic bilaterally. Multiple renal parenchymal calcifications, largest on the left measuring 5 mm in diameter. No hydronephrosis or hydroureter. No ureteral stones are  identified. The bladder is normal. Stomach/Bowel: There is prominent diffuse distention of the colon throughout. Stool is demonstrated in the right hemicolon. Small amount of fluid in the descending colon. Rectal contrast material is present with a rectal tube in place. No wall thickening or inflammatory changes. The small bowel are decompressed. Moderately distended stomach with air-fluid levels. Vascular/Lymphatic: Aortic atherosclerosis. No enlarged abdominal or pelvic lymph nodes. Reproductive: Prostate is unremarkable. Other: No free air or free fluid in the abdomen. Abdominal wall musculature appears intact. Musculoskeletal: Degenerative changes in the spine and hips. No destructive bone lesions. IMPRESSION: 1. Prominent diffuse distention of the colon throughout with air-fluid levels. No evidence of small bowel obstruction. No inflammatory changes or obstructing lesion identified. Changes likely to represent adynamic ileus. 2. Small bilateral pleural effusions with bilateral basilar atelectasis. Effusions are increasing in size since previous study. 3. Fluid in the distal esophagus suggesting reflux. 4. Cholelithiasis with distended gallbladder. 5. Bilateral renal atrophy with multiple renal parenchymal calcifications. No hydronephrosis or hydroureter. 6. Aortic atherosclerosis. Aortic Atherosclerosis (ICD10-I70.0). Electronically Signed   By: Lucienne Capers M.D.   On: 10/23/2020 00:15   CT Abdomen Pelvis Wo Contrast  Result Date: 10/12/2020 CLINICAL DATA:  15 pound weight loss in 2 weeks. Nausea and vomiting. EXAM: CT ABDOMEN AND  PELVIS WITHOUT CONTRAST TECHNIQUE: Multidetector CT imaging of the abdomen and pelvis was performed following the standard protocol without IV contrast. COMPARISON:  06/13/2018 FINDINGS: Lower chest: Small bilateral pleural effusions. Contrast medium in the distal esophagus suggesting dysmotility or reflux. Low-density blood pool suggests anemia. Hepatobiliary: Dependent  gallstones in the gallbladder. Trace perihepatic ascites. Pancreas: Punctate calcifications in the pancreas likely reflecting chronic calcific pancreatitis. Spleen: Unremarkable Adrenals/Urinary Tract: There about 8 nonobstructive right renal calculi measuring up to 0.5 cm in diameter. There are about 13 nonobstructive left renal calculi measuring up to 0.6 cm in diameter. No hydronephrosis, hydroureter, or ureteral calculus identified. Adrenal glands unremarkable. Stomach/Bowel: Rectal wall thickening noted with redundant dilated sigmoid colon extending up into the upper abdomen, but without overt volvulus. There is wall thickening in the descending colon. Gas-filled dilated transverse colon. Mild segmental wall thickening in the ascending colon. There is edema in the sigmoid colon mesentery and to a lesser extent in the central bowel mesentery normal appendix. No pneumatosis. Vascular/Lymphatic: Aortoiliac atherosclerotic vascular disease. Reproductive: Mild prostatomegaly. Other: Presacral edema. Mild scattered ascites. Mesenteric edema especially along the sigmoid mesentery. Musculoskeletal: Degenerative disc disease at L2-3 and L5-S1 with endplate irregularity and endplate sclerosis especially at L2-3. This has worsened compared to 06/13/2018. IMPRESSION: 1. Rectal wall thickening with redundant dilated sigmoid colon extending up into the upper abdomen, but without overt volvulus. There is also wall thickening in the descending colon, ascending colon, and sigmoid colon. There is edema in the sigmoid colon mesentery and to a lesser extent in the central bowel mesentery. The appearance is nonspecific but could be due to colitis, inflammatory bowel disease or inflammatory bowel disease; a component of functional colonic abnormalities not excluded given the moderately dilated appearance. 2. Small bilateral pleural effusions. 3. Low-density blood pool suggests anemia. 4. Cholelithiasis. 5. Bilateral nonobstructive  nephrolithiasis. 6. Mild prostatomegaly. 7. Degenerative disc disease at L2-3 and L5-S1 with endplate irregularity and endplate sclerosis especially at L2-3. This has worsened compared to 06/13/2018. 8. Contrast medium in the distal esophagus suggesting dysmotility or reflux. 9. Aortic atherosclerosis. Aortic Atherosclerosis (ICD10-I70.0). Electronically Signed   By: Van Clines M.D.   On: 10/12/2020 19:10   DG Chest 2 View  Result Date: 10/12/2020 CLINICAL DATA:  Near-syncope at dialysis. EXAM: CHEST - 2 VIEW COMPARISON:  09/24/2020 FINDINGS: A new right jugular dialysis catheter terminates over the lower SVC. The cardiomediastinal silhouette is unchanged with normal heart size. There are small pleural effusions with mild posterior basilar airspace opacity. The lungs are otherwise clear. No edema or pneumothorax is identified. No acute osseous abnormality is seen. There is persistent gaseous distension of likely large bowel loops in the upper abdomen, also present on the prior study and incompletely evaluated on these chest radiographs. IMPRESSION: 1. Small bilateral pleural effusions with mild basilar atelectasis. 2. Persistent gaseous distension of colon in the upper abdomen, incompletely evaluated. Electronically Signed   By: Logan Bores M.D.   On: 10/12/2020 14:39   DG CHEST PORT 1 VIEW  Result Date: 10/29/2020 CLINICAL DATA:  Hypoxia, orthostatic hypotension, atrial fibrillation EXAM: PORTABLE CHEST 1 VIEW COMPARISON:  10/12/2020 FINDINGS: Single frontal view of the chest demonstrates right internal jugular dialysis catheter unchanged. Cardiac silhouette is stable. Interval development of bibasilar veiling opacities compatible with consolidation and/or effusion. There is central vascular congestion. No pneumothorax. No acute bony abnormalities. IMPRESSION: 1. Findings compatible with fluid overload and bilateral pleural effusions. Electronically Signed   By: Randa Ngo M.D.   On:  10/29/2020 20:00   DG Abd Portable 1V  Result Date: 10/25/2020 CLINICAL DATA:  Abdominal distension. EXAM: PORTABLE ABDOMEN - 1 VIEW COMPARISON:  10/22/2020 FINDINGS: There is marked diffuse gaseous distension of the colon the appearance is not significantly changed when compared with 10/22/2020. A large stool burden is identified within the right colon, unchanged. No new findings. IMPRESSION: 1. No change in marked gaseous distension of the colon compatible with colonic ileus. 2. Unchanged large stool burden within the right colon. Electronically Signed   By: Kerby Moors M.D.   On: 10/25/2020 08:07   DG Abd Portable 2V  Result Date: 10/22/2020 CLINICAL DATA:  Abdominal distension.  History of bowel obstruction. EXAM: PORTABLE ABDOMEN - 2 VIEW COMPARISON:  CT 10/12/2020.  Radiography 09/24/2020. FINDINGS: Gaseous distension of the intestine. Transverse colon measures up to 12 cm in diameter. Moderate stent shin of the small intestine as well, maximal diameter 3.5 cm. Moderate amount of fecal matter in the right colon. No sign of free air. The appearance could be due to pseudo obstruction, ileus or actual distal colon obstruction. Gaseous distension appears even more pronounced than on the study of 10 days ago. IMPRESSION: Gaseous distension of the intestine, even more pronounced than on the study of 10 days ago. The appearance could be due to pseudo obstruction, ileus or actual distal colon obstruction. Electronically Signed   By: Nelson Chimes M.D.   On: 10/22/2020 13:55   ECHOCARDIOGRAM COMPLETE  Result Date: 10/13/2020    ECHOCARDIOGRAM REPORT   Patient Name:   SAVOY [place] Date of Exam: 10/13/2020 Medical Rec #:  [id number]   Height:       74.0 in Accession #:    [id number]  Weight:       160.0 lb Date of Birth:  1951/01/23   BSA:          1.977 m Patient Age:    51 years    BP:           133/73 mmHg Patient Gender: M           HR:           74 bpm. Exam Location:  [facility] Procedure: 2D Echo,  Cardiac Doppler and Color Doppler Indications:    Syncope  History:        Patient has no prior history of Echocardiogram examinations.                 Arrythmias:Atrial Fibrillation, Signs/Symptoms:Syncope; Risk                 Factors:Dyslipidemia. ESRD.  Sonographer:    Dustin Flock RDCS Referring Phys: [place]  1. Left ventricular ejection fraction, by estimation, is 60 to 65%. The left ventricle has normal function. The left ventricle has no regional wall motion abnormalities. Left ventricular diastolic parameters are consistent with Grade I diastolic dysfunction (impaired relaxation). Elevated left atrial pressure.  2. Right ventricular systolic function is normal. The right ventricular size is normal. There is normal pulmonary artery systolic pressure.  3. The mitral valve is normal in structure. No evidence of mitral valve regurgitation. No evidence of mitral stenosis.  4. The aortic valve is normal in structure. Aortic valve regurgitation is not visualized. No aortic stenosis is present.  5. There is Moderate (Grade III) protruding plaque involving the transverse aorta.  6. The inferior vena cava is normal in size with greater than 50% respiratory variability, suggesting right atrial pressure of  3 mmHg. FINDINGS  Left Ventricle: Left ventricular ejection fraction, by estimation, is 60 to 65%. The left ventricle has normal function. The left ventricle has no regional wall motion abnormalities. The left ventricular internal cavity size was normal in size. There is  no left ventricular hypertrophy. Left ventricular diastolic parameters are consistent with Grade I diastolic dysfunction (impaired relaxation). Elevated left atrial pressure. Right Ventricle: The right ventricular size is normal. No increase in right ventricular wall thickness. Right ventricular systolic function is normal. There is normal pulmonary artery systolic pressure. The tricuspid regurgitant velocity is  2.55 m/s, and  with an assumed right atrial pressure of 3 mmHg, the estimated right ventricular systolic pressure is 40.3 mmHg. Left Atrium: Left atrial size was normal in size. Right Atrium: Right atrial size was normal in size. Prominent Eustachian valve. Pericardium: There is no evidence of pericardial effusion. Mitral Valve: The mitral valve is normal in structure. No evidence of mitral valve regurgitation. No evidence of mitral valve stenosis. Tricuspid Valve: The tricuspid valve is normal in structure. Tricuspid valve regurgitation is trivial. No evidence of tricuspid stenosis. Aortic Valve: The aortic valve is normal in structure. Aortic valve regurgitation is not visualized. No aortic stenosis is present. Pulmonic Valve: The pulmonic valve was normal in structure. Pulmonic valve regurgitation is not visualized. No evidence of pulmonic stenosis. Aorta: The aortic root is normal in size and structure. There is moderate (Grade III) protruding plaque involving the transverse aorta. Venous: The inferior vena cava is normal in size with greater than 50% respiratory variability, suggesting right atrial pressure of 3 mmHg. IAS/Shunts: No atrial level shunt detected by color flow Doppler.  LEFT VENTRICLE PLAX 2D LVIDd:         4.96 cm  Diastology LVIDs:         3.66 cm  LV e' medial:    7.29 cm/s LV PW:         1.16 cm  LV E/e' medial:  9.7 LV IVS:        1.17 cm  LV e' lateral:   8.38 cm/s LVOT diam:     2.50 cm  LV E/e' lateral: 8.4 LV SV:         101 LV SV Index:   51 LVOT Area:     4.91 cm  RIGHT VENTRICLE RV Basal diam:  3.10 cm RV S prime:     8.70 cm/s TAPSE (M-mode): 4.2 cm LEFT ATRIUM             Index       RIGHT ATRIUM           Index LA diam:        3.80 cm 1.92 cm/m  RA Area:     19.40 cm LA Vol (A2C):   49.9 ml 25.24 ml/m RA Volume:   53.30 ml  26.96 ml/m LA Vol (A4C):   50.8 ml 25.70 ml/m LA Biplane Vol: 53.0 ml 26.81 ml/m  AORTIC VALVE LVOT Vmax:   99.20 cm/s LVOT Vmean:  67.300 cm/s LVOT VTI:     0.206 m  AORTA Ao Root diam: 3.50 cm MITRAL VALVE               TRICUSPID VALVE MV Area (PHT): 3.66 cm    TR Peak grad:   26.0 mmHg MV Decel Time: 207 msec    TR Vmax:        255.00 cm/s MV E velocity: 70.60 cm/s MV A velocity: 98.50 cm/s  SHUNTS MV E/A ratio:  0.72        Systemic VTI:  0.21 m                            Systemic Diam: 2.50 cm Dani Gobble Croitoru MD Electronically signed by Sanda Klein MD Signature Date/Time: 10/13/2020/1:20:39 PM    Final     Orson Eva, DO  [facility]  If 7PM-7AM, please contact night-coverage [email or web address] Password [email or web address] 10/30/2020, 4:26 PM   LOS: 17 days

## 2020-10-30 NOTE — Progress Notes (Signed)
Patient ID: [name], male   DOB: [birth year], 69 y.o.   MRN: [id number]   S:   HD yesterday, net positive  Stable RFP and CBC  No c/o this AM  No orthostatics this AM yet   O:BP (!) 145/85 (BP Location: Right Arm)   Pulse (!) 103   Temp 98.7 F (37.1 C) (Oral)   Resp 20   Ht 6\' 2"  (1.88 m)   Wt 74.7 kg   SpO2 94%   BMI 21.14 kg/m   Intake/Output Summary (Last 24 hours) at 10/30/2020 1001 Last data filed at 10/30/2020 0900 Gross per 24 hour  Intake --  Output 346 ml  Net -346 ml   Intake/Output:   I/O last 3 completed shifts: In: -  Out: 496 [Urine:840]  Intake/Output this shift:  Total I/O In: -  Out: 100 [Urine:100] Weight change:    NAD RIJ TDC No sig LEE S/nt Regular CTAB Nonfocl EOMI NCAT   Recent Labs  Lab 10/24/20 0551 10/29/20 0820  NA 127* 130*  K 4.6 4.2  CL 96* 96*  CO2 23 25  GLUCOSE 225* 161*  BUN 47* 51*  CREATININE 3.96* 3.97*  ALBUMIN  --  2.3*  CALCIUM 7.6* 8.2*  PHOS  --  1.7*   Liver Function Tests: Recent Labs  Lab 10/29/20 0820  ALBUMIN 2.3*   No results for input(s): LIPASE, AMYLASE in the last 168 hours. No results for input(s): AMMONIA in the last 168 hours. CBC: Recent Labs  Lab 10/24/20 0551 10/24/20 0551 10/25/20 0610 10/25/20 0610 10/26/20 0648 10/27/20 0817 10/29/20 0820  WBC 7.4   < > 7.2   < > 9.0 11.6* 12.1*  HGB 8.0*   < > 7.6*   < > 7.0* 8.3* 9.5*  HCT 25.6*   < > 24.3*   < > 22.0* 25.6* 30.1*  MCV 91.8  --  92.4  --  92.8 89.8 91.5  PLT 307   < > 269   < > 270 281 309   < > = values in this interval not displayed.   Cardiac Enzymes: No results for input(s): CKTOTAL, CKMB, CKMBINDEX, TROPONINI in the last 168 hours. CBG: Recent Labs  Lab 10/28/20 1844  GLUCAP 267*    Iron Studies: No results for input(s): IRON, TIBC, TRANSFERRIN, FERRITIN in the last 72 hours. Studies/Results: DG CHEST PORT 1 VIEW  Result Date: 10/29/2020 CLINICAL DATA:  Hypoxia, orthostatic hypotension, atrial  fibrillation EXAM: PORTABLE CHEST 1 VIEW COMPARISON:  10/12/2020 FINDINGS: Single frontal view of the chest demonstrates right internal jugular dialysis catheter unchanged. Cardiac silhouette is stable. Interval development of bibasilar veiling opacities compatible with consolidation and/or effusion. There is central vascular congestion. No pneumothorax. No acute bony abnormalities. IMPRESSION: 1. Findings compatible with fluid overload and bilateral pleural effusions. Electronically Signed   By: Randa Ngo M.D.   On: 10/29/2020 20:00   . (feeding supplement) PROSource Plus  30 mL Oral BID BM  . amiodarone  200 mg Oral Daily  . bisacodyl  10 mg Rectal Once  . calcitRIOL  0.5 mcg Oral Daily  . Chlorhexidine Gluconate Cloth  6 each Topical Q0600  . feeding supplement  237 mL Oral BID BM  . metoprolol tartrate  12.5 mg Oral BID  . midodrine  10 mg Oral TID with meals  . multivitamin  1 tablet Oral QHS  . polyethylene glycol  17 g Oral Daily  . predniSONE  40 mg Oral Q breakfast  .  simvastatin  20 mg Oral QPM    Assessment/Plan:  1. Syncope/hypotension; sig orthostasis:On TID Midodrine. HD with minimal UF, prob exacerbated by #2.  Stable but persistent 2. Keep even with HD--> sig UOP of > 1 L daily. 3. Colitis- GI following s/p ABX. Prednisone 40 mg daily started 11/27 4. [facility] on M[facility] at [facility]. HD on schedule today 12/1: no UF, no heparin, 3.5h, 2K 5. Anemia:of CKD- s/p blood transfusion. No heparin with HD. GI on board, colonoscopy findings as above. Rec 1u prbc 11/22 and 11/26; Hb stable 6. 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 Calcium corrects 7. Nutrition:per primary, push protein 8. H/o Hypertension:see #1 9. Vascular access- will need to f/u with Dr. Donnetta Hutching once stable for discharge. 10. A fib- on amiodarone and eliquis 11. Hyponatremia. Monitor, mild, ASx stable 12. Disposition- SNF   Rexene Agent  MD [facility]

## 2020-10-31 DIAGNOSIS — I951 Orthostatic hypotension: Secondary | ICD-10-CM | POA: Diagnosis not present

## 2020-10-31 LAB — CBC
HCT: 27.3 % — ABNORMAL LOW (ref 39.0–52.0)
Hemoglobin: 8.5 g/dL — ABNORMAL LOW (ref 13.0–17.0)
MCH: 28.6 pg (ref 26.0–34.0)
MCHC: 31.1 g/dL (ref 30.0–36.0)
MCV: 91.9 fL (ref 80.0–100.0)
Platelets: 240 10*3/uL (ref 150–400)
RBC: 2.97 MIL/uL — ABNORMAL LOW (ref 4.22–5.81)
RDW: 18.6 % — ABNORMAL HIGH (ref 11.5–15.5)
WBC: 8.8 10*3/uL (ref 4.0–10.5)
nRBC: 0 % (ref 0.0–0.2)

## 2020-10-31 LAB — BASIC METABOLIC PANEL
Anion gap: 10 (ref 5–15)
BUN: 56 mg/dL — ABNORMAL HIGH (ref 8–23)
CO2: 24 mmol/L (ref 22–32)
Calcium: 7.9 mg/dL — ABNORMAL LOW (ref 8.9–10.3)
Chloride: 93 mmol/L — ABNORMAL LOW (ref 98–111)
Creatinine, Ser: 3.5 mg/dL — ABNORMAL HIGH (ref 0.61–1.24)
GFR, Estimated: 18 mL/min — ABNORMAL LOW (ref >60)
Glucose, Bld: 176 mg/dL — ABNORMAL HIGH (ref 70–99)
Potassium: 3.6 mmol/L (ref 3.5–5.1)
Sodium: 127 mmol/L — ABNORMAL LOW (ref 135–145)

## 2020-10-31 MED ORDER — DARBEPOETIN ALFA 60 MCG/0.3ML IJ SOSY
60.0000 ug | PREFILLED_SYRINGE | INTRAMUSCULAR | Status: DC
  Administered 2020-10-31: 60 ug via INTRAVENOUS
  Filled 2020-10-31: qty 0.3

## 2020-10-31 NOTE — TOC Progression Note (Signed)
Transition of Care [facility]) - Progression Note    Patient Details  Name: [name] MRN: [id number] Date of Birth: 10/28/1951  [facility] CM/SW Contact  Salome Arnt, [place] Phone Number: 10/31/2020, 9:44 AM  Clinical Narrative:  Marlinda Mike updated Mardene Celeste at [place] on pt. Anticipate possible d/c later this week per MD. Pt will need repeat COVID test within 48 hours of d/c. TOC will continue to follow.      Expected Discharge Plan: [place] Barriers to Discharge: Continued Medical Work up  Expected Discharge Plan and Services Expected Discharge Plan: [place] arrangements for the past 2 months: Single Family Home                                       Social Determinants of Health (SDOH) Interventions    Readmission Risk Interventions No flowsheet data found.

## 2020-10-31 NOTE — Progress Notes (Signed)
PROGRESS NOTE    [name]  DJS:[id number] DOB: 12-09-1950 DOA: 10/12/2020 PCP: Patient, No Pcp Per     Brief Narrative:  [name] is a 69yo male with history of diabetes mellitus, CKD stage V started recently on dialysis Monday Wednesday Friday and still making urine, recent A. fib and was restarted on amiodarone metoprolol and Eliquis was sent from Coumadin center for new syncopal event and orthostasis. He does report syncope on Monday after dialysis, as well he does report orthostasis and near syncope for last couple days. He was started on midodrine by his nephrologist. This morning prior to dialysis, he felt dizzy and lightheaded where he was hypotensive and thus they sent him to ED. He does report diarrhea x2 weeks, he denies fever, chills or abdominal pain, he does report episodes of vomiting on Wednesday, but no recurrence, overall he reports good oral intake and fluid consumption. CT abdomen pelvis significant for wall thickening in the rectal area, with redundant dilated sigmoid colon, but no overt volvulus, so [facility] consulted to admit. Nephrology and GI was consulted. Had acute blood loss aded4unit PRBC transfused during this admission. And by GI underwent colonoscopy, poor prep were able to obtain biopsy and patient was monitored closely. Patient was placed on IV steroid with improving diarrhea/rectal bleeding.  Patient's GI issues overall improved without any further rectal bleeding.  He continued to struggle with adynamic ileus.  For this, daily cathartics were started and pt was encouraged to get OOB daily.  With this, he is now having daily BMs.  He continued to have intermittent paroxysms of afib with RVR.  After weighing risks/benefits in the setting of his orthostasis, he was started on low dose metoprolol which has decreased his episodes of Afib.  The last problem remains inability to remove fluid on HD due to hypotension.  10/29/20 CXR showed edema, but pt remains  stable on RA with saturation 93-94%.    New events last 24 hours / Subjective: Patient has no physical complaints today.  Per nursing staff, continues to have liquid stool throughout the day.  Rectal tube was placed today.  Assessment & Plan:   Active Problems:   Dehydration   ESRD on dialysis [facility])   Unspecified atrial fibrillation (HCC)   Syncope   Orthostatic hypotension   Chronic diarrhea   Colitis presumed infectious   Pressure injury of skin   Diarrhea   Rectal bleeding   Acute on chronic anemia   Near syncopal events with orthostatic hypotension -Secondary to intravascular volume depletion, blood loss anemia.  Patient received blood transfusions during hospitalization -Continue midodrine -Improved today BP 133/78 --> 109/65  Colitis/rectal bleeding with acute blood loss anemia with recent diarrhea: -Chronic diarrhea and recent work-up w/ GI panel and C. difficile are negative from 11/14 and 11/15. -Seen by GI-underwent colonoscopy 11/17-limited exam,colitis of sigmoid and descending colon; blood in rectosigmoid colon. Pathologyshowed severely active chronic nonspecific colitis with ulceration and ascending colon, and in the sigmoid and descending colon severely active chronic nonspecific colitis with ulceration. -Completed Cipro/Flagyl x10 days11/23 -Started on Solu-Medrol 60 mg daily 11/19. Per GI >> PO 40 mg daily and wean by 10 mg per week. Start 30 mg daily on 11/02/20 -Per cardiology, ok to hold apixaban for now  Adynamic ileus -CT scan 11/22shows diffuse distention of the colon throughout with air-fluid level, no small bowel obstruction, no infiltrative changes or obstructive lesion identified. -GI and surgery following continue conservative management -Now having loose stools requiring  rectal tube   Acute blood loss anemia -Due to rectal bleeding, s/p5units PRBC total this admission -Per cardiology, ok to hold apixaban for now  ESRD -Per  Nephrology   Recent paroxysmal A. fib -Started on amiodarone -Eliquis--holdingdue to his rectal bleeding and acute blood loss anemia -Start metoprolol 12.5 mg bid as pt having more afib RVR episodes  Hyperlipidemia -Continue Zocor   In agreement with assessment of the pressure ulcer as below:  Pressure Injury 10/13/20 Sacrum Circumferential Stage 2 -  Partial thickness loss of dermis presenting as a shallow open injury with a red, pink wound bed without slough. (Active)  10/13/20 1330  Location: Sacrum  Location Orientation: Circumferential  Staging: Stage 2 -  Partial thickness loss of dermis presenting as a shallow open injury with a red, pink wound bed without slough.  Wound Description (Comments):   Present on Admission: Yes     Nutrition Problem: Increased nutrient needs Etiology: chronic illness, wound healing (ESRD on HD; stage II sacrum)   DVT prophylaxis:  Place and maintain sequential compression device Start: 10/15/20 1209  Code Status: Full code Family Communication: No family at bedside Disposition Plan:  Status is: Inpatient  Remains inpatient appropriate because:Inpatient level of care appropriate due to severity of illness   Dispo:  Patient From:    Planned Disposition:    Expected discharge date: 11/01/20  Medically stable for discharge:   Plan for SNF discharge 12/2   Antimicrobials:  Anti-infectives (From admission, onward)   Start     Dose/Rate Route Frequency Ordered Stop   10/13/20 1400  metroNIDAZOLE (FLAGYL) tablet 500 mg  Status:  Discontinued        500 mg Oral Every 8 hours 10/13/20 1215 10/23/20 0742   10/13/20 1230  ciprofloxacin (CIPRO) tablet 500 mg  Status:  Discontinued        500 mg Oral Daily 10/13/20 1215 10/23/20 0742        Objective: Vitals:   10/31/20 1215 10/31/20 1230 10/31/20 1245 10/31/20 1300  BP: 119/78 121/74 132/72 126/76  Pulse: 77 77 74 83  Resp:      Temp:      TempSrc:      SpO2:      Weight:       Height:        Intake/Output Summary (Last 24 hours) at 10/31/2020 1323 Last data filed at 10/31/2020 1015 Gross per 24 hour  Intake 1300 ml  Output 625 ml  Net 675 ml   Filed Weights   10/30/20 0900 10/31/20 0601 10/31/20 1145  Weight: 76.1 kg 73.9 kg 73.9 kg    Examination:  General exam: Appears calm and comfortable  Respiratory system: Clear to auscultation. Respiratory effort normal. No respiratory distress. No conversational dyspnea.  Cardiovascular system: S1 & S2 heard, RRR. No murmurs. No pedal edema. Gastrointestinal system: Abdomen is nondistended, soft and nontender. Normal bowel sounds heard. Central nervous system: Alert and oriented. No focal neurological deficits. Speech clear.  Extremities: Symmetric in appearance  Skin: No rashes, lesions or ulcers on exposed skin  Psychiatry: Judgement and insight appear normal. Mood & affect appropriate.   Data Reviewed: I have personally reviewed following labs and imaging studies  CBC: Recent Labs  Lab 10/25/20 0610 10/26/20 0648 10/27/20 0817 10/29/20 0820 10/31/20 0849  WBC 7.2 9.0 11.6* 12.1* 8.8  HGB 7.6* 7.0* 8.3* 9.5* 8.5*  HCT 24.3* 22.0* 25.6* 30.1* 27.3*  MCV 92.4 92.8 89.8 91.5 91.9  PLT 269 270 281 309  644   Basic Metabolic Panel: Recent Labs  Lab 10/29/20 0820 10/31/20 0849  NA 130* 127*  K 4.2 3.6  CL 96* 93*  CO2 25 24  GLUCOSE 161* 176*  BUN 51* 56*  CREATININE 3.97* 3.50*  CALCIUM 8.2* 7.9*  PHOS 1.7*  --    GFR: Estimated Creatinine Clearance: 20.5 mL/min (A) (by C-G formula based on SCr of 3.5 mg/dL (H)). Liver Function Tests: Recent Labs  Lab 10/29/20 0820  ALBUMIN 2.3*   No results for input(s): LIPASE, AMYLASE in the last 168 hours. No results for input(s): AMMONIA in the last 168 hours. Coagulation Profile: No results for input(s): INR, PROTIME in the last 168 hours. Cardiac Enzymes: No results for input(s): CKTOTAL, CKMB, CKMBINDEX, TROPONINI in the last 168 hours. BNP  (last 3 results) No results for input(s): PROBNP in the last 8760 hours. HbA1C: No results for input(s): HGBA1C in the last 72 hours. CBG: Recent Labs  Lab 10/28/20 1844  GLUCAP 267*   Lipid Profile: No results for input(s): CHOL, HDL, LDLCALC, TRIG, CHOLHDL, LDLDIRECT in the last 72 hours. Thyroid Function Tests: No results for input(s): TSH, T4TOTAL, FREET4, T3FREE, THYROIDAB in the last 72 hours. Anemia Panel: No results for input(s): VITAMINB12, FOLATE, FERRITIN, TIBC, IRON, RETICCTPCT in the last 72 hours. Sepsis Labs: No results for input(s): PROCALCITON, LATICACIDVEN in the last 168 hours.  Recent Results (from the past 240 hour(s))  Resp Panel by RT-PCR (Flu A&B, Covid) Nasopharyngeal Swab     Status: None   Collection Time: 10/29/20  3:03 PM   Specimen: Nasopharyngeal Swab; Nasopharyngeal(NP) swabs in vial transport medium  Result Value Ref Range Status   SARS Coronavirus 2 by RT PCR NEGATIVE NEGATIVE Final    Comment: (NOTE) SARS-CoV-2 target nucleic acids are NOT DETECTED.  The SARS-CoV-2 RNA is generally detectable in upper respiratory specimens during the acute phase of infection. The lowest concentration of SARS-CoV-2 viral copies this assay can detect is 138 copies/mL. A negative result does not preclude SARS-Cov-2 infection and should not be used as the sole basis for treatment or other patient management decisions. A negative result may occur with  improper specimen collection/handling, submission of specimen other than nasopharyngeal swab, presence of viral mutation(s) within the areas targeted by this assay, and inadequate number of viral copies(<138 copies/mL). A negative result must be combined with clinical observations, patient history, and epidemiological information. The expected result is Negative.  Fact Sheet for Patients:  EntrepreneurPulse.com.au  Fact Sheet for Healthcare Providers:   IncredibleEmployment.be  This test is no t yet approved or cleared by the [place] FDA and  has been authorized for detection and/or diagnosis of SARS-CoV-2 by FDA under an Emergency Use Authorization (EUA). This EUA will remain  in effect (meaning this test can be used) for the duration of the COVID-19 declaration under Section 564(b)(1) of the Act, 21 U.S.C.section 360bbb-3(b)(1), unless the authorization is terminated  or revoked sooner.       Influenza A by PCR NEGATIVE NEGATIVE Final   Influenza B by PCR NEGATIVE NEGATIVE Final    Comment: (NOTE) The Xpert Xpress SARS-CoV-2/FLU/RSV plus assay is intended as an aid in the diagnosis of influenza from Nasopharyngeal swab specimens and should not be used as a sole basis for treatment. Nasal washings and aspirates are unacceptable for Xpert Xpress SARS-CoV-2/FLU/RSV testing.  Fact Sheet for Patients: EntrepreneurPulse.com.au  Fact Sheet for Healthcare Providers: IncredibleEmployment.be  This test is not yet approved or cleared by the [place]  FDA and has been authorized for detection and/or diagnosis of SARS-CoV-2 by FDA under an Emergency Use Authorization (EUA). This EUA will remain in effect (meaning this test can be used) for the duration of the COVID-19 declaration under Section 564(b)(1) of the Act, 21 U.S.C. section 360bbb-3(b)(1), unless the authorization is terminated or revoked.  Performed at [facility], [address] [place], [place] [address]       Radiology Studies: DG CHEST PORT 1 VIEW  Result Date: 10/29/2020 CLINICAL DATA:  Hypoxia, orthostatic hypotension, atrial fibrillation EXAM: PORTABLE CHEST 1 VIEW COMPARISON:  10/12/2020 FINDINGS: Single frontal view of the chest demonstrates right internal jugular dialysis catheter unchanged. Cardiac silhouette is stable. Interval development of bibasilar veiling opacities compatible with  consolidation and/or effusion. There is central vascular congestion. No pneumothorax. No acute bony abnormalities. IMPRESSION: 1. Findings compatible with fluid overload and bilateral pleural effusions. Electronically Signed   By: Randa Ngo M.D.   On: 10/29/2020 20:00      Scheduled Meds: . (feeding supplement) PROSource Plus  30 mL Oral BID BM  . amiodarone  200 mg Oral Daily  . calcitRIOL  0.5 mcg Oral Daily  . Chlorhexidine Gluconate Cloth  6 each Topical Q0600  . darbepoetin (ARANESP) injection - DIALYSIS  60 mcg Intravenous Q Wed-HD  . feeding supplement  237 mL Oral BID BM  . metoprolol tartrate  12.5 mg Oral BID  . midodrine  10 mg Oral TID with meals  . multivitamin  1 tablet Oral QHS  . [START ON 11/02/2020] predniSONE  30 mg Oral Q breakfast  . predniSONE  40 mg Oral Q breakfast  . simvastatin  20 mg Oral QPM   Continuous Infusions:   LOS: 18 days      Time spent: 35 minutes   Dessa Phi, DO [facility] 10/31/2020, 1:23 PM   Available via Epic secure chat 7am-7pm After these hours, please refer to coverage provider listed on [email or web address]

## 2020-10-31 NOTE — Procedures (Signed)
   HEMODIALYSIS TREATMENT NOTE:  Best treatment yet:  BP and HR stable throughout session with  Minimal UF - enough only to offset prime/rinse/flushes and net zero.  Catheter at prescribed rate with stable pressures.  3.5 hour heparin-free session completed.  All blood was returned.  He declined standing pressure d/t flex-seal.   Lying at end of treatment: 144/75 p72 Lying after blood returned: 135/75 p77 Sitting: 134/74 p79   Rockwell Alexandria, RN

## 2020-10-31 NOTE — Progress Notes (Addendum)
Patient ID: [name], male   DOB: 27-Jun-1951, 69 y.o.   MRN: [id number]   S:   No c/o this AM  AM orthostatics mush improved SBP 133-->109 lying ot standing   O:BP 135/83 (BP Location: Right Arm)   Pulse 74   Temp 97.8 F (36.6 C) (Oral)   Resp 18   Ht 6\' 2"  (1.88 m)   Wt 73.9 kg   SpO2 97%   BMI 20.92 kg/m   Intake/Output Summary (Last 24 hours) at 10/31/2020 0948 Last data filed at 10/31/2020 0919 Gross per 24 hour  Intake 1300 ml  Output 600 ml  Net 700 ml   Intake/Output:   I/O last 3 completed shifts: In: -  Out: 1250 [Urine:1250]  Intake/Output this shift:  Total I/O In: 1300 [P.O.:1300] Out: -  Weight change: 1.4 kg   NAD RIJ TDC No sig LEE S/nt Regular CTAB Nonfocl EOMI NCAT   Recent Labs  Lab 10/29/20 0820 10/31/20 0849  NA 130* 127*  K 4.2 3.6  CL 96* 93*  CO2 25 24  GLUCOSE 161* 176*  BUN 51* 56*  CREATININE 3.97* 3.50*  ALBUMIN 2.3*  --   CALCIUM 8.2* 7.9*  PHOS 1.7*  --    Liver Function Tests: Recent Labs  Lab 10/29/20 0820  ALBUMIN 2.3*   No results for input(s): LIPASE, AMYLASE in the last 168 hours. No results for input(s): AMMONIA in the last 168 hours. CBC: Recent Labs  Lab 10/25/20 0610 10/25/20 0610 10/26/20 0648 10/26/20 0648 10/27/20 0817 10/29/20 0820 10/31/20 0849  WBC 7.2   < > 9.0   < > 11.6* 12.1* 8.8  HGB 7.6*   < > 7.0*   < > 8.3* 9.5* 8.5*  HCT 24.3*   < > 22.0*   < > 25.6* 30.1* 27.3*  MCV 92.4  --  92.8  --  89.8 91.5 91.9  PLT 269   < > 270   < > 281 309 240   < > = values in this interval not displayed.   Cardiac Enzymes: No results for input(s): CKTOTAL, CKMB, CKMBINDEX, TROPONINI in the last 168 hours. CBG: Recent Labs  Lab 10/28/20 1844  GLUCAP 267*    Iron Studies: No results for input(s): IRON, TIBC, TRANSFERRIN, FERRITIN in the last 72 hours. Studies/Results: DG CHEST PORT 1 VIEW  Result Date: 10/29/2020 CLINICAL DATA:  Hypoxia, orthostatic hypotension, atrial fibrillation  EXAM: PORTABLE CHEST 1 VIEW COMPARISON:  10/12/2020 FINDINGS: Single frontal view of the chest demonstrates right internal jugular dialysis catheter unchanged. Cardiac silhouette is stable. Interval development of bibasilar veiling opacities compatible with consolidation and/or effusion. There is central vascular congestion. No pneumothorax. No acute bony abnormalities. IMPRESSION: 1. Findings compatible with fluid overload and bilateral pleural effusions. Electronically Signed   By: Randa Ngo M.D.   On: 10/29/2020 20:00   . (feeding supplement) PROSource Plus  30 mL Oral BID BM  . amiodarone  200 mg Oral Daily  . calcitRIOL  0.5 mcg Oral Daily  . Chlorhexidine Gluconate Cloth  6 each Topical Q0600  . feeding supplement  237 mL Oral BID BM  . metoprolol tartrate  12.5 mg Oral BID  . midodrine  10 mg Oral TID with meals  . multivitamin  1 tablet Oral QHS  . [START ON 11/02/2020] predniSONE  30 mg Oral Q breakfast  . predniSONE  40 mg Oral Q breakfast  . simvastatin  20 mg Oral QPM    Assessment/Plan:  1. Syncope/hypotension; sig orthostasis:On TID Midodrine. HD with minimal UF, prob exacerbated by #2.  Improved 2. Keep even with HD--> sig UOP of > 1 L daily. 3. Colitis- GI following s/p ABX. Prednisone 40 mg daily started 11/27 4. [facility] start on M[facility] at [facility]. HD on schedule today 12/1: no UF, no heparin, 3.5h, 2K 5. Anemia:of CKD- s/p blood transfusion. No heparin with HD. GI on board, colonoscopy findings as above. Rec 1u prbc 11/22 and 11/26; Hb stable.  ESA with HD today.  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 Calcium corrects; Phos low no binders encourage PO 7. Nutrition:per primary, push protein 8. H/o Hypertension:see #1 9. Vascular access- will need to f/u with Dr. Donnetta Hutching once stable for discharge. 10. A fib- on amio and MTP, AC per cardiology off apixaban 11. Hyponatremia. Monitor, mild, ASx  stable 12. Disposition- SNF, no inpatient renal needs ok for discharge   Rexene Agent  MD [facility]

## 2020-10-31 NOTE — Plan of Care (Signed)
  Problem: Education: Goal: Knowledge of General Education information will improve Description: Including pain rating scale, medication(s)/side effects and non-pharmacologic comfort measures Outcome: Progressing   Problem: Health Behavior/Discharge Planning: Goal: Ability to manage health-related needs will improve Outcome: Progressing   Problem: Clinical Measurements: Goal: Ability to maintain clinical measurements within normal limits will improve Outcome: Progressing Goal: Will remain free from infection Outcome: Progressing Goal: Diagnostic test results will improve Outcome: Progressing Goal: Respiratory complications will improve Outcome: Progressing Goal: Cardiovascular complication will be avoided Outcome: Progressing   Problem: Coping: Goal: Level of anxiety will decrease Outcome: Progressing   Problem: Nutrition: Goal: Adequate nutrition will be maintained Outcome: Progressing   Problem: Elimination: Goal: Will not experience complications related to bowel motility Outcome: Progressing Goal: Will not experience complications related to urinary retention Outcome: Progressing   Problem: Safety: Goal: Ability to remain free from injury will improve Outcome: Progressing   Problem: Pain Managment: Goal: General experience of comfort will improve Outcome: Progressing   Problem: Skin Integrity: Goal: Risk for impaired skin integrity will decrease Outcome: Progressing

## 2020-11-01 DIAGNOSIS — R140 Abdominal distension (gaseous): Secondary | ICD-10-CM | POA: Diagnosis not present

## 2020-11-01 DIAGNOSIS — I959 Hypotension, unspecified: Secondary | ICD-10-CM | POA: Diagnosis not present

## 2020-11-01 DIAGNOSIS — N390 Urinary tract infection, site not specified: Secondary | ICD-10-CM | POA: Diagnosis not present

## 2020-11-01 DIAGNOSIS — T829XXA Unspecified complication of cardiac and vascular prosthetic device, implant and graft, initial encounter: Secondary | ICD-10-CM | POA: Diagnosis not present

## 2020-11-01 DIAGNOSIS — R0689 Other abnormalities of breathing: Secondary | ICD-10-CM | POA: Diagnosis not present

## 2020-11-01 DIAGNOSIS — K633 Ulcer of intestine: Secondary | ICD-10-CM | POA: Diagnosis not present

## 2020-11-01 DIAGNOSIS — K567 Ileus, unspecified: Secondary | ICD-10-CM | POA: Diagnosis not present

## 2020-11-01 DIAGNOSIS — Z8249 Family history of ischemic heart disease and other diseases of the circulatory system: Secondary | ICD-10-CM | POA: Diagnosis not present

## 2020-11-01 DIAGNOSIS — E876 Hypokalemia: Secondary | ICD-10-CM | POA: Diagnosis present

## 2020-11-01 DIAGNOSIS — D123 Benign neoplasm of transverse colon: Secondary | ICD-10-CM | POA: Diagnosis not present

## 2020-11-01 DIAGNOSIS — D122 Benign neoplasm of ascending colon: Secondary | ICD-10-CM | POA: Diagnosis not present

## 2020-11-01 DIAGNOSIS — T8249XA Other complication of vascular dialysis catheter, initial encounter: Secondary | ICD-10-CM | POA: Diagnosis not present

## 2020-11-01 DIAGNOSIS — R0902 Hypoxemia: Secondary | ICD-10-CM | POA: Diagnosis not present

## 2020-11-01 DIAGNOSIS — T82898D Other specified complication of vascular prosthetic devices, implants and grafts, subsequent encounter: Secondary | ICD-10-CM | POA: Diagnosis not present

## 2020-11-01 DIAGNOSIS — D649 Anemia, unspecified: Secondary | ICD-10-CM | POA: Diagnosis not present

## 2020-11-01 DIAGNOSIS — Y846 Urinary catheterization as the cause of abnormal reaction of the patient, or of later complication, without mention of misadventure at the time of the procedure: Secondary | ICD-10-CM | POA: Diagnosis not present

## 2020-11-01 DIAGNOSIS — E871 Hypo-osmolality and hyponatremia: Secondary | ICD-10-CM | POA: Diagnosis not present

## 2020-11-01 DIAGNOSIS — I4891 Unspecified atrial fibrillation: Secondary | ICD-10-CM | POA: Diagnosis not present

## 2020-11-01 DIAGNOSIS — T8249XD Other complication of vascular dialysis catheter, subsequent encounter: Secondary | ICD-10-CM | POA: Diagnosis not present

## 2020-11-01 DIAGNOSIS — T8241XA Breakdown (mechanical) of vascular dialysis catheter, initial encounter: Secondary | ICD-10-CM | POA: Diagnosis not present

## 2020-11-01 DIAGNOSIS — R197 Diarrhea, unspecified: Secondary | ICD-10-CM | POA: Diagnosis not present

## 2020-11-01 DIAGNOSIS — K922 Gastrointestinal hemorrhage, unspecified: Secondary | ICD-10-CM | POA: Diagnosis not present

## 2020-11-01 DIAGNOSIS — Z66 Do not resuscitate: Secondary | ICD-10-CM | POA: Diagnosis not present

## 2020-11-01 DIAGNOSIS — Z992 Dependence on renal dialysis: Secondary | ICD-10-CM | POA: Diagnosis not present

## 2020-11-01 DIAGNOSIS — Z7189 Other specified counseling: Secondary | ICD-10-CM | POA: Diagnosis not present

## 2020-11-01 DIAGNOSIS — R55 Syncope and collapse: Secondary | ICD-10-CM | POA: Diagnosis not present

## 2020-11-01 DIAGNOSIS — I10 Essential (primary) hypertension: Secondary | ICD-10-CM | POA: Diagnosis not present

## 2020-11-01 DIAGNOSIS — N186 End stage renal disease: Secondary | ICD-10-CM | POA: Diagnosis not present

## 2020-11-01 DIAGNOSIS — K560 Paralytic ileus: Secondary | ICD-10-CM | POA: Diagnosis present

## 2020-11-01 DIAGNOSIS — K529 Noninfective gastroenteritis and colitis, unspecified: Secondary | ICD-10-CM | POA: Diagnosis not present

## 2020-11-01 DIAGNOSIS — I120 Hypertensive chronic kidney disease with stage 5 chronic kidney disease or end stage renal disease: Secondary | ICD-10-CM | POA: Diagnosis not present

## 2020-11-01 DIAGNOSIS — I951 Orthostatic hypotension: Secondary | ICD-10-CM | POA: Diagnosis present

## 2020-11-01 DIAGNOSIS — Z515 Encounter for palliative care: Secondary | ICD-10-CM | POA: Diagnosis not present

## 2020-11-01 DIAGNOSIS — Z79899 Other long term (current) drug therapy: Secondary | ICD-10-CM | POA: Diagnosis not present

## 2020-11-01 DIAGNOSIS — E860 Dehydration: Secondary | ICD-10-CM | POA: Diagnosis not present

## 2020-11-01 DIAGNOSIS — I480 Paroxysmal atrial fibrillation: Secondary | ICD-10-CM | POA: Diagnosis present

## 2020-11-01 DIAGNOSIS — K625 Hemorrhage of anus and rectum: Secondary | ICD-10-CM | POA: Diagnosis present

## 2020-11-01 DIAGNOSIS — R279 Unspecified lack of coordination: Secondary | ICD-10-CM | POA: Diagnosis not present

## 2020-11-01 DIAGNOSIS — E43 Unspecified severe protein-calorie malnutrition: Secondary | ICD-10-CM | POA: Diagnosis not present

## 2020-11-01 DIAGNOSIS — D631 Anemia in chronic kidney disease: Secondary | ICD-10-CM | POA: Diagnosis present

## 2020-11-01 DIAGNOSIS — Y712 Prosthetic and other implants, materials and accessory cardiovascular devices associated with adverse incidents: Secondary | ICD-10-CM | POA: Diagnosis not present

## 2020-11-01 DIAGNOSIS — Z752 Other waiting period for investigation and treatment: Secondary | ICD-10-CM | POA: Diagnosis not present

## 2020-11-01 DIAGNOSIS — Z20822 Contact with and (suspected) exposure to covid-19: Secondary | ICD-10-CM | POA: Diagnosis not present

## 2020-11-01 DIAGNOSIS — Z681 Body mass index (BMI) 19 or less, adult: Secondary | ICD-10-CM | POA: Diagnosis not present

## 2020-11-01 DIAGNOSIS — T83511A Infection and inflammatory reaction due to indwelling urethral catheter, initial encounter: Secondary | ICD-10-CM | POA: Diagnosis not present

## 2020-11-01 DIAGNOSIS — E785 Hyperlipidemia, unspecified: Secondary | ICD-10-CM | POA: Diagnosis not present

## 2020-11-01 DIAGNOSIS — R0681 Apnea, not elsewhere classified: Secondary | ICD-10-CM | POA: Diagnosis not present

## 2020-11-01 DIAGNOSIS — I4892 Unspecified atrial flutter: Secondary | ICD-10-CM | POA: Diagnosis not present

## 2020-11-01 DIAGNOSIS — Z23 Encounter for immunization: Secondary | ICD-10-CM | POA: Diagnosis not present

## 2020-11-01 DIAGNOSIS — E1122 Type 2 diabetes mellitus with diabetic chronic kidney disease: Secondary | ICD-10-CM | POA: Diagnosis present

## 2020-11-01 DIAGNOSIS — E119 Type 2 diabetes mellitus without complications: Secondary | ICD-10-CM | POA: Diagnosis not present

## 2020-11-01 LAB — CBC
HCT: 23.6 % — ABNORMAL LOW (ref 39.0–52.0)
Hemoglobin: 7.5 g/dL — ABNORMAL LOW (ref 13.0–17.0)
MCH: 29 pg (ref 26.0–34.0)
MCHC: 31.8 g/dL (ref 30.0–36.0)
MCV: 91.1 fL (ref 80.0–100.0)
Platelets: 213 10*3/uL (ref 150–400)
RBC: 2.59 MIL/uL — ABNORMAL LOW (ref 4.22–5.81)
RDW: 18.5 % — ABNORMAL HIGH (ref 11.5–15.5)
WBC: 4.3 10*3/uL (ref 4.0–10.5)
nRBC: 0 % (ref 0.0–0.2)

## 2020-11-01 LAB — BASIC METABOLIC PANEL
Anion gap: 8 (ref 5–15)
BUN: 36 mg/dL — ABNORMAL HIGH (ref 8–23)
CO2: 26 mmol/L (ref 22–32)
Calcium: 7.5 mg/dL — ABNORMAL LOW (ref 8.9–10.3)
Chloride: 95 mmol/L — ABNORMAL LOW (ref 98–111)
Creatinine, Ser: 2.51 mg/dL — ABNORMAL HIGH (ref 0.61–1.24)
GFR, Estimated: 27 mL/min — ABNORMAL LOW (ref >60)
Glucose, Bld: 205 mg/dL — ABNORMAL HIGH (ref 70–99)
Potassium: 3 mmol/L — ABNORMAL LOW (ref 3.5–5.1)
Sodium: 129 mmol/L — ABNORMAL LOW (ref 135–145)

## 2020-11-01 LAB — SARS CORONAVIRUS 2 BY RT PCR (HOSPITAL ORDER, PERFORMED IN ~~LOC~~ HOSPITAL LAB): SARS Coronavirus 2: NEGATIVE

## 2020-11-01 MED ORDER — MIDODRINE HCL 10 MG PO TABS: 10.0000 mg | ORAL_TABLET | Freq: Three times a day (TID) | ORAL | 0 refills | Status: AC

## 2020-11-01 MED ORDER — METOPROLOL TARTRATE 25 MG PO TABS
12.5000 mg | ORAL_TABLET | Freq: Two times a day (BID) | ORAL | 0 refills | Status: DC
Start: 2020-11-01 — End: 2020-12-10

## 2020-11-01 MED ORDER — PREDNISONE 10 MG PO TABS: 30.0000 mg | ORAL_TABLET | Freq: Every day | ORAL | 0 refills | Status: DC

## 2020-11-01 NOTE — Plan of Care (Signed)

## 2020-11-01 NOTE — Progress Notes (Signed)
Patient ID: [name], male   DOB: Oct 01, 1951, 69 y.o.   MRN: [id number]   S:   No c/o this AM  Tol HD yesterday, BPs stable throughout   O:BP (!) 160/89   Pulse 63   Temp 98.1 F (36.7 C)   Resp 18   Ht 6\' 2"  (1.88 m)   Wt 73.9 kg   SpO2 99%   BMI 20.92 kg/m   Intake/Output Summary (Last 24 hours) at 11/01/2020 0951 Last data filed at 10/31/2020 1530 Gross per 24 hour  Intake --  Output 125 ml  Net -125 ml   Intake/Output:   I/O last 3 completed shifts: In: 1300 [P.O.:1300] Out: 625 [Urine:625]  Intake/Output this shift:  No intake/output data recorded. Weight change: -2.2 kg   NAD RIJ TDC No sig LEE S/nt Regular CTAB Nonfocl EOMI NCAT   Recent Labs  Lab 10/29/20 0820 10/31/20 0849  NA 130* 127*  K 4.2 3.6  CL 96* 93*  CO2 25 24  GLUCOSE 161* 176*  BUN 51* 56*  CREATININE 3.97* 3.50*  ALBUMIN 2.3*  --   CALCIUM 8.2* 7.9*  PHOS 1.7*  --    Liver Function Tests: Recent Labs  Lab 10/29/20 0820  ALBUMIN 2.3*   No results for input(s): LIPASE, AMYLASE in the last 168 hours. No results for input(s): AMMONIA in the last 168 hours. CBC: Recent Labs  Lab 10/26/20 0648 10/26/20 0648 10/27/20 0817 10/29/20 0820 10/31/20 0849  WBC 9.0   < > 11.6* 12.1* 8.8  HGB 7.0*   < > 8.3* 9.5* 8.5*  HCT 22.0*   < > 25.6* 30.1* 27.3*  MCV 92.8  --  89.8 91.5 91.9  PLT 270   < > 281 309 240   < > = values in this interval not displayed.   Cardiac Enzymes: No results for input(s): CKTOTAL, CKMB, CKMBINDEX, TROPONINI in the last 168 hours. CBG: Recent Labs  Lab 10/28/20 1844  GLUCAP 267*    Iron Studies: No results for input(s): IRON, TIBC, TRANSFERRIN, FERRITIN in the last 72 hours. Studies/Results: No results found. . (feeding supplement) PROSource Plus  30 mL Oral BID BM  . amiodarone  200 mg Oral Daily  . calcitRIOL  0.5 mcg Oral Daily  . Chlorhexidine Gluconate Cloth  6 each Topical Q0600  . darbepoetin (ARANESP) injection - DIALYSIS  60 mcg  Intravenous Q Wed-HD  . feeding supplement  237 mL Oral BID BM  . metoprolol tartrate  12.5 mg Oral BID  . midodrine  10 mg Oral TID with meals  . multivitamin  1 tablet Oral QHS  . [START ON 11/02/2020] predniSONE  30 mg Oral Q breakfast  . simvastatin  20 mg Oral QPM    Assessment/Plan:  1. Syncope/hypotension; sig orthostasis:On TID Midodrine. HD with minimal UF, prob exacerbated by #2.  Improved 1. Keep even with HD--> sig UOP of > 1 L daily. 2. Colitis- GI following s/p ABX. Prednisone started 11/27 3. [facility] HD on schedule: no UF, no heparin, 3.5h, 2K 4. Anemia:of CKD- s/p blood transfusion. No heparin with HD. GI on board, colonoscopy findings as above. Rec 1u prbc 11/22 and 11/26; Hb stable.  ESA with HD qWed.  5. CKD-MBD:continue with home meds.Calcitriol 0.9mcg daily (was on this as an outpatient). Calcium corrects; Phos low no binders encourage PO 6. Nutrition:per primary, push protein 7. H/o Hypertension:see #1 8. Vascular access- will need to f/u with Dr. Donnetta Hutching  once stable for discharge. 9. A fib- on amio and MTP, AC per cardiology off apixaban 10. Hyponatremia. Monitor, mild, ASx stable 11. Disposition- SNF, no inpatient renal needs ok for discharge   Rexene Agent  MD [facility]

## 2020-11-01 NOTE — Care Management Important Message (Signed)
Important Message  Patient Details  Name: [name] MRN: [id number] Date of Birth: Jan 28, 1951   Medicare Important Message Given:  Yes     [name] 11/01/2020, 12:30 PM

## 2020-11-01 NOTE — Discharge Summary (Signed)
Physician Discharge Summary  [name] HKV:[id number] DOB: [birth year] DOA: 10/12/2020  PCP: Patient, No Pcp Per  Admit date: 10/12/2020 Discharge date: 11/01/2020  Admitted From: Home Disposition:  SNF   Recommendations for Outpatient Follow-up:  1. Follow up with PCP in 1 week 2. Follow up with Cardiology  3. Follow up with GI as scheduled  Discharge Condition: Stable CODE STATUS: FULL  Diet recommendation:  Diet Orders (From admission, onward)    Start     Ordered   10/29/20 0849  Diet Heart Room service appropriate? Yes; Fluid consistency: Thin  Diet effective now       Question Answer Comment  Room service appropriate? Yes   Fluid consistency: Thin      10/29/20 0848         Brief/Interim Summary: [name] is a 70yo male with history of diabetes mellitus, CKD stage V started recently on dialysis Monday Wednesday Friday and still making urine, recent A. fib and was restarted on amiodarone metoprolol and Eliquis was sent from Coumadin center for new syncopal event and orthostasis. He does report syncope on Monday after dialysis, as well he does report orthostasis and near syncope for last couple days. He was started on midodrine by his nephrologist. This morning prior to dialysis, he felt dizzy and lightheaded where he was hypotensive and thus they sent him to ED. He does report diarrhea x2 weeks, he denies fever, chills or abdominal pain, he does report episodes of vomiting on Wednesday, but no recurrence, overall he reports good oral intake and fluid consumption. CT abdomen pelvis significant for wall thickening in the rectal area, with redundant dilated sigmoid colon, but no overt volvulus, so [facility] hospitalist consulted to admit. Nephrology and GI was consulted. Had acute blood loss aded4unit PRBC transfused during this admission. And by GI underwent colonoscopy, poor prep were able to obtain biopsy and patient was monitored closely. Patient was placed on IV steroid with  improving diarrhea/rectal bleeding.  Patient's GI issues overall improved without any further rectal bleeding. He continued to struggle with adynamic ileus. For this, daily cathartics were started and pt was encouraged to get OOB daily. With this, he is now having daily BMs.He continued to have intermittent paroxysms of afib with RVR. After weighing risks/benefits in the setting of his orthostasis, he was started on low dose metoprolol which has decreased his episodes of Afib. The last problem remains inability to remove fluid on HD due to hypotension. 10/29/20 CXR showed edema, but pt remains stable on RA with saturation 93-94%. His orthostatic hypotension improved and was discharged to SNF in stable condition.   Discharge Diagnoses:  Principal Problem:   Orthostatic hypotension Active Problems:   Dehydration   ESRD on dialysis (HCC)   Unspecified atrial fibrillation (HCC)   Syncope   Chronic diarrhea   Colitis presumed infectious   Pressure injury of skin   Diarrhea   Rectal bleeding   Acute on chronic anemia   Near syncopal events with orthostatic hypotension -Secondary to intravascular volume depletion, blood loss anemia.  Patient received blood transfusions during hospitalization -Continue midodrine -Improved BP 133/78 --> 109/65  Colitis/rectal bleeding with acute blood loss anemia with recent diarrhea: -Chronic diarrhea and recent work-up w/ GI panel and C. difficile are negative from 11/14 and 11/15. -Seen by GI-underwent colonoscopy 11/17-limited exam,colitis of sigmoid and descending colon; blood in rectosigmoid colon. Pathologyshowed severely active chronic nonspecific colitis with ulceration and ascending colon, and in the sigmoid and descending colon severely  active chronic nonspecific colitis with ulceration. -Completed Cipro/Flagyl x10 days11/23 -Started on Solu-Medrol 60 mg daily 11/19. Per GI >> PO 40 mg daily and wean by 10 mg per week. Start 30 mg daily  on 11/02/20 -Per cardiology, ok to hold apixaban for now  Adynamic ileus -CT scan 11/22shows diffuse distention of the colon throughout with air-fluid level, no small bowel obstruction, no infiltrative changes or obstructive lesion identified. -GI and surgery following continue conservative management -Improved   Acute blood loss anemia -Due to rectal bleeding, s/p5units PRBC total this admission -Per cardiology, ok to hold apixaban for now  ESRD -Per Nephrology   Recent paroxysmal A. fib -Started on amiodarone -Eliquis--holdingdue to his rectal bleeding and acute blood loss anemia -Start metoprolol 12.5 mg bid as pt having more afib RVR episodes  Hyperlipidemia -Continue Zocor   In agreement with assessment of the pressure ulcer as below:  Pressure Injury 10/13/20 Sacrum Circumferential Stage 2 -  Partial thickness loss of dermis presenting as a shallow open injury with a red, pink wound bed without slough. (Active)  10/13/20 1330  Location: Sacrum  Location Orientation: Circumferential  Staging: Stage 2 -  Partial thickness loss of dermis presenting as a shallow open injury with a red, pink wound bed without slough.  Wound Description (Comments):   Present on Admission: Yes    Nutrition Problem: Increased nutrient needs Etiology: chronic illness, wound healing (ESRD on HD; stage II sacrum)  Discharge Instructions  Discharge Instructions    Increase activity slowly   Complete by: As directed    No wound care   Complete by: As directed      Allergies as of 11/01/2020   No Known Allergies     Medication List    STOP taking these medications   Eliquis 2.5 MG Tabs tablet Generic drug: apixaban   metoprolol succinate 25 MG 24 hr tablet Commonly known as: TOPROL-XL     TAKE these medications   amiodarone 200 MG tablet Commonly known as: PACERONE Take 200 mg by mouth daily.   metoprolol tartrate 25 MG tablet Commonly known as: LOPRESSOR Take  0.5 tablets (12.5 mg total) by mouth 2 (two) times daily.   midodrine 10 MG tablet Commonly known as: PROAMATINE Take 1 tablet (10 mg total) by mouth with breakfast, with lunch, and with evening meal. What changed:   medication strength  how much to take  when to take this   ondansetron 4 MG tablet Commonly known as: ZOFRAN Take by mouth.   predniSONE 10 MG tablet Commonly known as: DELTASONE Take 3 tablets (30 mg total) by mouth daily with breakfast for 7 days. Then wean by 10mg  weekly. Start taking on: November 02, 2020   simvastatin 20 MG tablet Commonly known as: ZOCOR Take 20 mg by mouth every evening.       Contact information for follow-up providers    Satira Sark, MD. Schedule an appointment as soon as possible for a visit in 2 week(s).   Specialty: Cardiology Contact information: [place] [address] [phone]        Harvel Quale, MD. Go on 11/13/2020.   Specialty: Gastroenterology Contact information: [address] [place] [address] [phone]            Contact information for after-discharge care    [facility] .   Service: Skilled Nursing Contact information: [address] [place] [phone]                 No Known Allergies   Procedures/Studies: CT ABDOMEN PELVIS WO CONTRAST  Result Date: 10/23/2020 CLINICAL DATA:  Abdominal distension with gaseous distention of bowel on abdominal radiographs. Elevated creatinine. EXAM: CT ABDOMEN AND PELVIS WITHOUT CONTRAST TECHNIQUE: Multidetector CT imaging of the abdomen and pelvis was performed following the standard protocol without IV contrast. COMPARISON:  10/12/2020 FINDINGS: Lower chest: Small bilateral pleural effusions with bilateral basilar atelectasis. Effusions are increasing in size since previous study. Fluid in the  distal esophagus suggesting reflux. Hepatobiliary: Normal appearance of the liver. The gallbladder is distended with multiple small stones present. No bile duct dilatation. Pancreas: Unremarkable. No pancreatic ductal dilatation or surrounding inflammatory changes. Spleen: Normal in size without focal abnormality. Adrenals/Urinary Tract: No adrenal gland nodules. Renal parenchyma is atrophic bilaterally. Multiple renal parenchymal calcifications, largest on the left measuring 5 mm in diameter. No hydronephrosis or hydroureter. No ureteral stones are identified. The bladder is normal. Stomach/Bowel: There is prominent diffuse distention of the colon throughout. Stool is demonstrated in the right hemicolon. Small amount of fluid in the descending colon. Rectal contrast material is present with a rectal tube in place. No wall thickening or inflammatory changes. The small bowel are decompressed. Moderately distended stomach with air-fluid levels. Vascular/Lymphatic: Aortic atherosclerosis. No enlarged abdominal or pelvic lymph nodes. Reproductive: Prostate is unremarkable. Other: No free air or free fluid in the abdomen. Abdominal wall musculature appears intact. Musculoskeletal: Degenerative changes in the spine and hips. No destructive bone lesions. IMPRESSION: 1. Prominent diffuse distention of the colon throughout with air-fluid levels. No evidence of small bowel obstruction. No inflammatory changes or obstructing lesion identified. Changes likely to represent adynamic ileus. 2. Small bilateral pleural effusions with bilateral basilar atelectasis. Effusions are increasing in size since previous study. 3. Fluid in the distal esophagus suggesting reflux. 4. Cholelithiasis with distended gallbladder. 5. Bilateral renal atrophy with multiple renal parenchymal calcifications. No hydronephrosis or hydroureter. 6. Aortic atherosclerosis. Aortic Atherosclerosis (ICD10-I70.0). Electronically Signed   By: Lucienne Capers  M.D.   On: 10/23/2020 00:15   CT Abdomen Pelvis Wo Contrast  Result Date: 10/12/2020 CLINICAL DATA:  15 pound weight loss in 2 weeks. Nausea and vomiting. EXAM: CT ABDOMEN AND PELVIS WITHOUT CONTRAST TECHNIQUE: Multidetector CT imaging of the abdomen and pelvis was performed following the standard protocol without IV contrast. COMPARISON:  06/13/2018 FINDINGS: Lower chest: Small bilateral pleural effusions. Contrast medium in the distal esophagus suggesting dysmotility or reflux. Low-density blood pool suggests anemia. Hepatobiliary: Dependent gallstones in the gallbladder. Trace perihepatic ascites. Pancreas: Punctate calcifications in the pancreas likely reflecting chronic calcific pancreatitis. Spleen: Unremarkable Adrenals/Urinary Tract: There about 8 nonobstructive right renal calculi measuring up to 0.5 cm in diameter. There are about 13 nonobstructive left renal calculi measuring up to 0.6 cm in diameter. No hydronephrosis, hydroureter, or ureteral calculus identified. Adrenal glands unremarkable. Stomach/Bowel: Rectal wall thickening noted with redundant dilated sigmoid colon extending up into the upper abdomen, but without overt volvulus. There is wall thickening in the descending colon. Gas-filled dilated transverse colon. Mild segmental wall thickening in the ascending colon. There is edema in the sigmoid colon mesentery and to a lesser extent in the central bowel mesentery normal appendix. No pneumatosis. Vascular/Lymphatic: Aortoiliac atherosclerotic vascular disease. Reproductive: Mild prostatomegaly. Other: Presacral edema. Mild scattered ascites. Mesenteric edema especially along the sigmoid mesentery. Musculoskeletal: Degenerative disc disease at L2-3 and L5-S1 with  endplate irregularity and endplate sclerosis especially at L2-3. This has worsened compared to 06/13/2018. IMPRESSION: 1. Rectal wall thickening with redundant dilated sigmoid colon extending up into the upper abdomen, but without  overt volvulus. There is also wall thickening in the descending colon, ascending colon, and sigmoid colon. There is edema in the sigmoid colon mesentery and to a lesser extent in the central bowel mesentery. The appearance is nonspecific but could be due to colitis, inflammatory bowel disease or inflammatory bowel disease; a component of functional colonic abnormalities not excluded given the moderately dilated appearance. 2. Small bilateral pleural effusions. 3. Low-density blood pool suggests anemia. 4. Cholelithiasis. 5. Bilateral nonobstructive nephrolithiasis. 6. Mild prostatomegaly. 7. Degenerative disc disease at L2-3 and L5-S1 with endplate irregularity and endplate sclerosis especially at L2-3. This has worsened compared to 06/13/2018. 8. Contrast medium in the distal esophagus suggesting dysmotility or reflux. 9. Aortic atherosclerosis. Aortic Atherosclerosis (ICD10-I70.0). Electronically Signed   By: Van Clines M.D.   On: 10/12/2020 19:10   DG Chest 2 View  Result Date: 10/12/2020 CLINICAL DATA:  Near-syncope at dialysis. EXAM: CHEST - 2 VIEW COMPARISON:  09/24/2020 FINDINGS: A new right jugular dialysis catheter terminates over the lower SVC. The cardiomediastinal silhouette is unchanged with normal heart size. There are small pleural effusions with mild posterior basilar airspace opacity. The lungs are otherwise clear. No edema or pneumothorax is identified. No acute osseous abnormality is seen. There is persistent gaseous distension of likely large bowel loops in the upper abdomen, also present on the prior study and incompletely evaluated on these chest radiographs. IMPRESSION: 1. Small bilateral pleural effusions with mild basilar atelectasis. 2. Persistent gaseous distension of colon in the upper abdomen, incompletely evaluated. Electronically Signed   By: Logan Bores M.D.   On: 10/12/2020 14:39   DG CHEST PORT 1 VIEW  Result Date: 10/29/2020 CLINICAL DATA:  Hypoxia, orthostatic  hypotension, atrial fibrillation EXAM: PORTABLE CHEST 1 VIEW COMPARISON:  10/12/2020 FINDINGS: Single frontal view of the chest demonstrates right internal jugular dialysis catheter unchanged. Cardiac silhouette is stable. Interval development of bibasilar veiling opacities compatible with consolidation and/or effusion. There is central vascular congestion. No pneumothorax. No acute bony abnormalities. IMPRESSION: 1. Findings compatible with fluid overload and bilateral pleural effusions. Electronically Signed   By: Randa Ngo M.D.   On: 10/29/2020 20:00   DG Abd Portable 1V  Result Date: 10/25/2020 CLINICAL DATA:  Abdominal distension. EXAM: PORTABLE ABDOMEN - 1 VIEW COMPARISON:  10/22/2020 FINDINGS: There is marked diffuse gaseous distension of the colon the appearance is not significantly changed when compared with 10/22/2020. A large stool burden is identified within the right colon, unchanged. No new findings. IMPRESSION: 1. No change in marked gaseous distension of the colon compatible with colonic ileus. 2. Unchanged large stool burden within the right colon. Electronically Signed   By: Kerby Moors M.D.   On: 10/25/2020 08:07   DG Abd Portable 2V  Result Date: 10/22/2020 CLINICAL DATA:  Abdominal distension.  History of bowel obstruction. EXAM: PORTABLE ABDOMEN - 2 VIEW COMPARISON:  CT 10/12/2020.  Radiography 09/24/2020. FINDINGS: Gaseous distension of the intestine. Transverse colon measures up to 12 cm in diameter. Moderate stent shin of the small intestine as well, maximal diameter 3.5 cm. Moderate amount of fecal matter in the right colon. No sign of free air. The appearance could be due to pseudo obstruction, ileus or actual distal colon obstruction. Gaseous distension appears even more pronounced than on the study of 10 days ago. IMPRESSION:  Gaseous distension of the intestine, even more pronounced than on the study of 10 days ago. The appearance could be due to pseudo obstruction,  ileus or actual distal colon obstruction. Electronically Signed   By: Nelson Chimes M.D.   On: 10/22/2020 13:55   ECHOCARDIOGRAM COMPLETE  Result Date: 10/13/2020    ECHOCARDIOGRAM REPORT   Patient Name:   SIMCHA SPEIR Date of Exam: 10/13/2020 Medical Rec #:  [id number]   Height:       74.0 in Accession #:    [id number]  Weight:       160.0 lb Date of Birth:  09-15-1951   BSA:          1.977 m Patient Age:    75 years    BP:           133/73 mmHg Patient Gender: M           HR:           74 bpm. Exam Location:  [facility] Procedure: 2D Echo, Cardiac Doppler and Color Doppler Indications:    Syncope  History:        Patient has no prior history of Echocardiogram examinations.                 Arrythmias:Atrial Fibrillation, Signs/Symptoms:Syncope; Risk                 Factors:Dyslipidemia. ESRD.  Sonographer:    Dustin Flock RDCS Referring Phys: [place]  1. Left ventricular ejection fraction, by estimation, is 60 to 65%. The left ventricle has normal function. The left ventricle has no regional wall motion abnormalities. Left ventricular diastolic parameters are consistent with Grade I diastolic dysfunction (impaired relaxation). Elevated left atrial pressure.  2. Right ventricular systolic function is normal. The right ventricular size is normal. There is normal pulmonary artery systolic pressure.  3. The mitral valve is normal in structure. No evidence of mitral valve regurgitation. No evidence of mitral stenosis.  4. The aortic valve is normal in structure. Aortic valve regurgitation is not visualized. No aortic stenosis is present.  5. There is Moderate (Grade III) protruding plaque involving the transverse aorta.  6. The inferior vena cava is normal in size with greater than 50% respiratory variability, suggesting right atrial pressure of 3 mmHg. FINDINGS  Left Ventricle: Left ventricular ejection fraction, by estimation, is 60 to 65%. The left ventricle has normal function. The  left ventricle has no regional wall motion abnormalities. The left ventricular internal cavity size was normal in size. There is  no left ventricular hypertrophy. Left ventricular diastolic parameters are consistent with Grade I diastolic dysfunction (impaired relaxation). Elevated left atrial pressure. Right Ventricle: The right ventricular size is normal. No increase in right ventricular wall thickness. Right ventricular systolic function is normal. There is normal pulmonary artery systolic pressure. The tricuspid regurgitant velocity is 2.55 m/s, and  with an assumed right atrial pressure of 3 mmHg, the estimated right ventricular systolic pressure is 86.7 mmHg. Left Atrium: Left atrial size was normal in size. Right Atrium: Right atrial size was normal in size. Prominent Eustachian valve. Pericardium: There is no evidence of pericardial effusion. Mitral Valve: The mitral valve is normal in structure. No evidence of mitral valve regurgitation. No evidence of mitral valve stenosis. Tricuspid Valve: The tricuspid valve is normal in structure. Tricuspid valve regurgitation is trivial. No evidence of tricuspid stenosis. Aortic Valve: The aortic valve is normal in structure. Aortic valve regurgitation  is not visualized. No aortic stenosis is present. Pulmonic Valve: The pulmonic valve was normal in structure. Pulmonic valve regurgitation is not visualized. No evidence of pulmonic stenosis. Aorta: The aortic root is normal in size and structure. There is moderate (Grade III) protruding plaque involving the transverse aorta. Venous: The inferior vena cava is normal in size with greater than 50% respiratory variability, suggesting right atrial pressure of 3 mmHg. IAS/Shunts: No atrial level shunt detected by color flow Doppler.  LEFT VENTRICLE PLAX 2D LVIDd:         4.96 cm  Diastology LVIDs:         3.66 cm  LV e' medial:    7.29 cm/s LV PW:         1.16 cm  LV E/e' medial:  9.7 LV IVS:        1.17 cm  LV e' lateral:    8.38 cm/s LVOT diam:     2.50 cm  LV E/e' lateral: 8.4 LV SV:         101 LV SV Index:   51 LVOT Area:     4.91 cm  RIGHT VENTRICLE RV Basal diam:  3.10 cm RV S prime:     8.70 cm/s TAPSE (M-mode): 4.2 cm LEFT ATRIUM             Index       RIGHT ATRIUM           Index LA diam:        3.80 cm 1.92 cm/m  RA Area:     19.40 cm LA Vol (A2C):   49.9 ml 25.24 ml/m RA Volume:   53.30 ml  26.96 ml/m LA Vol (A4C):   50.8 ml 25.70 ml/m LA Biplane Vol: 53.0 ml 26.81 ml/m  AORTIC VALVE LVOT Vmax:   99.20 cm/s LVOT Vmean:  67.300 cm/s LVOT VTI:    0.206 m  AORTA Ao Root diam: 3.50 cm MITRAL VALVE               TRICUSPID VALVE MV Area (PHT): 3.66 cm    TR Peak grad:   26.0 mmHg MV Decel Time: 207 msec    TR Vmax:        255.00 cm/s MV E velocity: 70.60 cm/s MV A velocity: 98.50 cm/s  SHUNTS MV E/A ratio:  0.72        Systemic VTI:  0.21 m                            Systemic Diam: 2.50 cm Mihai Croitoru MD Electronically signed by Sanda Klein MD Signature Date/Time: 10/13/2020/1:20:39 PM    Final         Discharge Exam: Vitals:   10/31/20 2057 11/01/20 0531  BP: (!) 145/80 (!) 160/89  Pulse: 72 63  Resp: 17 18  Temp: 98.3 F (36.8 C) 98.1 F (36.7 C)  SpO2:  99%    General: Pt is alert, awake, not in acute distress Cardiovascular: RRR, S1/S2 +, no edema Respiratory: CTA bilaterally, no wheezing, no rhonchi, no respiratory distress, no conversational dyspnea  Abdominal: Soft, NT, ND, bowel sounds + Extremities: no edema, no cyanosis Psych: Normal mood and affect, stable judgement and insight     The results of significant diagnostics from this hospitalization (including imaging, microbiology, ancillary and laboratory) are listed below for reference.     Microbiology: Recent Results (from the past 240 hour(s))  Resp Panel  by RT-PCR (Flu A&B, Covid) Nasopharyngeal Swab     Status: None   Collection Time: 10/29/20  3:03 PM   Specimen: Nasopharyngeal Swab; Nasopharyngeal(NP) swabs in vial  transport medium  Result Value Ref Range Status   SARS Coronavirus 2 by RT PCR NEGATIVE NEGATIVE Final    Comment: (NOTE) SARS-CoV-2 target nucleic acids are NOT DETECTED.  The SARS-CoV-2 RNA is generally detectable in upper respiratory specimens during the acute phase of infection. The lowest concentration of SARS-CoV-2 viral copies this assay can detect is 138 copies/mL. A negative result does not preclude SARS-Cov-2 infection and should not be used as the sole basis for treatment or other patient management decisions. A negative result may occur with  improper specimen collection/handling, submission of specimen other than nasopharyngeal swab, presence of viral mutation(s) within the areas targeted by this assay, and inadequate number of viral copies(<138 copies/mL). A negative result must be combined with clinical observations, patient history, and epidemiological information. The expected result is Negative.  Fact Sheet for Patients:  EntrepreneurPulse.com.au  Fact Sheet for Healthcare Providers:  IncredibleEmployment.be  This test is no t yet approved or cleared by the [place] FDA and  has been authorized for detection and/or diagnosis of SARS-CoV-2 by FDA under an Emergency Use Authorization (EUA). This EUA will remain  in effect (meaning this test can be used) for the duration of the COVID-19 declaration under Section 564(b)(1) of the Act, 21 U.S.C.section 360bbb-3(b)(1), unless the authorization is terminated  or revoked sooner.       Influenza A by PCR NEGATIVE NEGATIVE Final   Influenza B by PCR NEGATIVE NEGATIVE Final    Comment: (NOTE) The Xpert Xpress SARS-CoV-2/FLU/RSV plus assay is intended as an aid in the diagnosis of influenza from Nasopharyngeal swab specimens and should not be used as a sole basis for treatment. Nasal washings and aspirates are unacceptable for Xpert Xpress SARS-CoV-2/FLU/RSV testing.  Fact  Sheet for Patients: EntrepreneurPulse.com.au  Fact Sheet for Healthcare Providers: IncredibleEmployment.be  This test is not yet approved or cleared by the [place] FDA and has been authorized for detection and/or diagnosis of SARS-CoV-2 by FDA under an Emergency Use Authorization (EUA). This EUA will remain in effect (meaning this test can be used) for the duration of the COVID-19 declaration under Section 564(b)(1) of the Act, 21 U.S.C. section 360bbb-3(b)(1), unless the authorization is terminated or revoked.  Performed at [facility], [address], [place], [place] [address]      Labs: BNP (last 3 results) No results for input(s): BNP in the last 8760 hours. Basic Metabolic Panel: Recent Labs  Lab 10/29/20 0820 10/31/20 0849  NA 130* 127*  K 4.2 3.6  CL 96* 93*  CO2 25 24  GLUCOSE 161* 176*  BUN 51* 56*  CREATININE 3.97* 3.50*  CALCIUM 8.2* 7.9*  PHOS 1.7*  --    Liver Function Tests: Recent Labs  Lab 10/29/20 0820  ALBUMIN 2.3*   No results for input(s): LIPASE, AMYLASE in the last 168 hours. No results for input(s): AMMONIA in the last 168 hours. CBC: Recent Labs  Lab 10/26/20 0648 10/27/20 0817 10/29/20 0820 10/31/20 0849  WBC 9.0 11.6* 12.1* 8.8  HGB 7.0* 8.3* 9.5* 8.5*  HCT 22.0* 25.6* 30.1* 27.3*  MCV 92.8 89.8 91.5 91.9  PLT 270 281 309 240   Cardiac Enzymes: No results for input(s): CKTOTAL, CKMB, CKMBINDEX, TROPONINI in the last 168 hours. BNP: Invalid input(s): POCBNP CBG: Recent Labs  Lab 10/28/20 1844  GLUCAP  267*   D-Dimer No results for input(s): DDIMER in the last 72 hours. Hgb A1c No results for input(s): HGBA1C in the last 72 hours. Lipid Profile No results for input(s): CHOL, HDL, LDLCALC, TRIG, CHOLHDL, LDLDIRECT in the last 72 hours. Thyroid function studies No results for input(s): TSH, T4TOTAL, T3FREE, THYROIDAB in the last 72 hours.  Invalid input(s): FREET3 Anemia work  up No results for input(s): VITAMINB12, FOLATE, FERRITIN, TIBC, IRON, RETICCTPCT in the last 72 hours. Urinalysis    Component Value Date/Time   COLORURINE YELLOW 10/12/2020 1622   APPEARANCEUR CLEAR 10/12/2020 1622   LABSPEC 1.008 10/12/2020 1622   PHURINE 8.0 10/12/2020 1622   GLUCOSEU >=500 (A) 10/12/2020 1622   HGBUR NEGATIVE 10/12/2020 1622   BILIRUBINUR NEGATIVE 10/12/2020 1622   KETONESUR NEGATIVE 10/12/2020 1622   PROTEINUR 100 (A) 10/12/2020 1622   NITRITE NEGATIVE 10/12/2020 1622   LEUKOCYTESUR NEGATIVE 10/12/2020 1622   Sepsis Labs Invalid input(s): PROCALCITONIN,  WBC,  LACTICIDVEN Microbiology Recent Results (from the past 240 hour(s))  Resp Panel by RT-PCR (Flu A&B, Covid) Nasopharyngeal Swab     Status: None   Collection Time: 10/29/20  3:03 PM   Specimen: Nasopharyngeal Swab; Nasopharyngeal(NP) swabs in vial transport medium  Result Value Ref Range Status   SARS Coronavirus 2 by RT PCR NEGATIVE NEGATIVE Final    Comment: (NOTE) SARS-CoV-2 target nucleic acids are NOT DETECTED.  The SARS-CoV-2 RNA is generally detectable in upper respiratory specimens during the acute phase of infection. The lowest concentration of SARS-CoV-2 viral copies this assay can detect is 138 copies/mL. A negative result does not preclude SARS-Cov-2 infection and should not be used as the sole basis for treatment or other patient management decisions. A negative result may occur with  improper specimen collection/handling, submission of specimen other than nasopharyngeal swab, presence of viral mutation(s) within the areas targeted by this assay, and inadequate number of viral copies(<138 copies/mL). A negative result must be combined with clinical observations, patient history, and epidemiological information. The expected result is Negative.  Fact Sheet for Patients:  EntrepreneurPulse.com.au  Fact Sheet for Healthcare Providers:   IncredibleEmployment.be  This test is no t yet approved or cleared by the [place] FDA and  has been authorized for detection and/or diagnosis of SARS-CoV-2 by FDA under an Emergency Use Authorization (EUA). This EUA will remain  in effect (meaning this test can be used) for the duration of the COVID-19 declaration under Section 564(b)(1) of the Act, 21 U.S.C.section 360bbb-3(b)(1), unless the authorization is terminated  or revoked sooner.       Influenza A by PCR NEGATIVE NEGATIVE Final   Influenza B by PCR NEGATIVE NEGATIVE Final    Comment: (NOTE) The Xpert Xpress SARS-CoV-2/FLU/RSV plus assay is intended as an aid in the diagnosis of influenza from Nasopharyngeal swab specimens and should not be used as a sole basis for treatment. Nasal washings and aspirates are unacceptable for Xpert Xpress SARS-CoV-2/FLU/RSV testing.  Fact Sheet for Patients: EntrepreneurPulse.com.au  Fact Sheet for Healthcare Providers: IncredibleEmployment.be  This test is not yet approved or cleared by the [place] FDA and has been authorized for detection and/or diagnosis of SARS-CoV-2 by FDA under an Emergency Use Authorization (EUA). This EUA will remain in effect (meaning this test can be used) for the duration of the COVID-19 declaration under Section 564(b)(1) of the Act, 21 U.S.C. section 360bbb-3(b)(1), unless the authorization is terminated or revoked.  Performed at [facility], [address] [place], [place] [address]  Patient was seen and examined on the day of discharge and was found to be in stable condition. Time coordinating discharge: 45 minutes including assessment and coordination of care, as well as examination of the patient.   SIGNED:  Dessa Phi, DO [facility] 11/01/2020, 9:10 AM

## 2020-11-01 NOTE — TOC Transition Note (Signed)
Transition of Care [facility]) - CM/SW Discharge Note  Patient Details  Name: [name] MRN: [id number] Date of Birth: Mar 08, 1951  Transition of Care [facility]) CM/SW Contact:  Sherie Don, LCSW Phone Number: 11/01/2020, 12:00 PM  Clinical Narrative: Patient to discharge to [facility] for SNF. Patient's COVID result is negative. Discharge summary, discharge orders, FL2, and therapy notes faxed to [place] called Mardene Celeste with [facility] regarding discharge. Patient will go to [facility] room 131 and the number to call for report is [phone]. Medical necessity form completed and printed to RN station; EMS scheduled. Patient updated. TOC signing off.  Final next level of care: Skilled Nursing Facility Barriers to Discharge: Barriers Resolved  Patient Goals and CMS Choice Patient states their goals for this hospitalization and ongoing recovery are:: Discharge to rehab CMS Medicare.gov Compare Post Acute Care list provided to:: Patient Choice offered to / list presented to : Patient  Discharge Placement PASRR number recieved: 10/15/20         Patient chooses bed at: [facility] Patient to be transferred to facility by: [facility]  Discharge Plan and Services        DME Arranged: N/A DME Agency: NA HH Arranged: NA [place] Agency: NA  Readmission Risk Interventions No flowsheet data found.

## 2020-11-02 ENCOUNTER — Encounter (HOSPITAL_COMMUNITY): Payer: Self-pay

## 2020-11-02 ENCOUNTER — Other Ambulatory Visit: Payer: Self-pay

## 2020-11-02 ENCOUNTER — Inpatient Hospital Stay (HOSPITAL_COMMUNITY)
Admission: EM | Admit: 2020-11-02 | Discharge: 2020-12-11 | DRG: 673 | Payer: Medicare Other | Source: Skilled Nursing Facility | Attending: Family Medicine | Admitting: Family Medicine

## 2020-11-02 DIAGNOSIS — Z66 Do not resuscitate: Secondary | ICD-10-CM | POA: Diagnosis not present

## 2020-11-02 DIAGNOSIS — Z992 Dependence on renal dialysis: Secondary | ICD-10-CM

## 2020-11-02 DIAGNOSIS — J40 Bronchitis, not specified as acute or chronic: Secondary | ICD-10-CM | POA: Diagnosis not present

## 2020-11-02 DIAGNOSIS — T829XXA Unspecified complication of cardiac and vascular prosthetic device, implant and graft, initial encounter: Secondary | ICD-10-CM

## 2020-11-02 DIAGNOSIS — K5190 Ulcerative colitis, unspecified, without complications: Secondary | ICD-10-CM | POA: Diagnosis not present

## 2020-11-02 DIAGNOSIS — E1122 Type 2 diabetes mellitus with diabetic chronic kidney disease: Secondary | ICD-10-CM | POA: Diagnosis present

## 2020-11-02 DIAGNOSIS — Z752 Other waiting period for investigation and treatment: Secondary | ICD-10-CM

## 2020-11-02 DIAGNOSIS — K633 Ulcer of intestine: Secondary | ICD-10-CM | POA: Diagnosis present

## 2020-11-02 DIAGNOSIS — T8249XA Other complication of vascular dialysis catheter, initial encounter: Secondary | ICD-10-CM | POA: Diagnosis not present

## 2020-11-02 DIAGNOSIS — I959 Hypotension, unspecified: Secondary | ICD-10-CM | POA: Diagnosis present

## 2020-11-02 DIAGNOSIS — E871 Hypo-osmolality and hyponatremia: Secondary | ICD-10-CM | POA: Diagnosis not present

## 2020-11-02 DIAGNOSIS — L89159 Pressure ulcer of sacral region, unspecified stage: Secondary | ICD-10-CM | POA: Diagnosis present

## 2020-11-02 DIAGNOSIS — E43 Unspecified severe protein-calorie malnutrition: Secondary | ICD-10-CM | POA: Diagnosis not present

## 2020-11-02 DIAGNOSIS — T8249XD Other complication of vascular dialysis catheter, subsequent encounter: Secondary | ICD-10-CM | POA: Diagnosis not present

## 2020-11-02 DIAGNOSIS — R0681 Apnea, not elsewhere classified: Secondary | ICD-10-CM | POA: Diagnosis not present

## 2020-11-02 DIAGNOSIS — Z8679 Personal history of other diseases of the circulatory system: Secondary | ICD-10-CM | POA: Diagnosis not present

## 2020-11-02 DIAGNOSIS — I951 Orthostatic hypotension: Secondary | ICD-10-CM | POA: Diagnosis not present

## 2020-11-02 DIAGNOSIS — K52832 Lymphocytic colitis: Secondary | ICD-10-CM | POA: Diagnosis present

## 2020-11-02 DIAGNOSIS — D631 Anemia in chronic kidney disease: Secondary | ICD-10-CM | POA: Diagnosis present

## 2020-11-02 DIAGNOSIS — R0902 Hypoxemia: Secondary | ICD-10-CM | POA: Diagnosis not present

## 2020-11-02 DIAGNOSIS — R140 Abdominal distension (gaseous): Secondary | ICD-10-CM | POA: Diagnosis present

## 2020-11-02 DIAGNOSIS — I480 Paroxysmal atrial fibrillation: Secondary | ICD-10-CM | POA: Diagnosis present

## 2020-11-02 DIAGNOSIS — K560 Paralytic ileus: Secondary | ICD-10-CM | POA: Diagnosis present

## 2020-11-02 DIAGNOSIS — E119 Type 2 diabetes mellitus without complications: Secondary | ICD-10-CM | POA: Diagnosis present

## 2020-11-02 DIAGNOSIS — K529 Noninfective gastroenteritis and colitis, unspecified: Secondary | ICD-10-CM | POA: Diagnosis not present

## 2020-11-02 DIAGNOSIS — I872 Venous insufficiency (chronic) (peripheral): Secondary | ICD-10-CM | POA: Diagnosis not present

## 2020-11-02 DIAGNOSIS — R4182 Altered mental status, unspecified: Secondary | ICD-10-CM | POA: Diagnosis not present

## 2020-11-02 DIAGNOSIS — Z7189 Other specified counseling: Secondary | ICD-10-CM | POA: Diagnosis not present

## 2020-11-02 DIAGNOSIS — D123 Benign neoplasm of transverse colon: Secondary | ICD-10-CM | POA: Diagnosis not present

## 2020-11-02 DIAGNOSIS — N186 End stage renal disease: Secondary | ICD-10-CM | POA: Diagnosis not present

## 2020-11-02 DIAGNOSIS — M898X9 Other specified disorders of bone, unspecified site: Secondary | ICD-10-CM | POA: Diagnosis present

## 2020-11-02 DIAGNOSIS — Z95828 Presence of other vascular implants and grafts: Secondary | ICD-10-CM

## 2020-11-02 DIAGNOSIS — I700 Atherosclerosis of aorta: Secondary | ICD-10-CM | POA: Diagnosis not present

## 2020-11-02 DIAGNOSIS — T83511A Infection and inflammatory reaction due to indwelling urethral catheter, initial encounter: Secondary | ICD-10-CM | POA: Diagnosis not present

## 2020-11-02 DIAGNOSIS — K625 Hemorrhage of anus and rectum: Secondary | ICD-10-CM | POA: Diagnosis present

## 2020-11-02 DIAGNOSIS — E1143 Type 2 diabetes mellitus with diabetic autonomic (poly)neuropathy: Secondary | ICD-10-CM | POA: Diagnosis present

## 2020-11-02 DIAGNOSIS — I120 Hypertensive chronic kidney disease with stage 5 chronic kidney disease or end stage renal disease: Secondary | ICD-10-CM | POA: Diagnosis not present

## 2020-11-02 DIAGNOSIS — E876 Hypokalemia: Secondary | ICD-10-CM | POA: Diagnosis present

## 2020-11-02 DIAGNOSIS — T8241XA Breakdown (mechanical) of vascular dialysis catheter, initial encounter: Secondary | ICD-10-CM | POA: Diagnosis not present

## 2020-11-02 DIAGNOSIS — D122 Benign neoplasm of ascending colon: Secondary | ICD-10-CM | POA: Diagnosis not present

## 2020-11-02 DIAGNOSIS — Y712 Prosthetic and other implants, materials and accessory cardiovascular devices associated with adverse incidents: Secondary | ICD-10-CM | POA: Diagnosis not present

## 2020-11-02 DIAGNOSIS — E785 Hyperlipidemia, unspecified: Secondary | ICD-10-CM | POA: Diagnosis not present

## 2020-11-02 DIAGNOSIS — Z933 Colostomy status: Secondary | ICD-10-CM

## 2020-11-02 DIAGNOSIS — N269 Renal sclerosis, unspecified: Secondary | ICD-10-CM | POA: Diagnosis present

## 2020-11-02 DIAGNOSIS — K567 Ileus, unspecified: Secondary | ICD-10-CM | POA: Diagnosis not present

## 2020-11-02 DIAGNOSIS — D649 Anemia, unspecified: Secondary | ICD-10-CM | POA: Diagnosis not present

## 2020-11-02 DIAGNOSIS — Z79899 Other long term (current) drug therapy: Secondary | ICD-10-CM

## 2020-11-02 DIAGNOSIS — T82898D Other specified complication of vascular prosthetic devices, implants and grafts, subsequent encounter: Secondary | ICD-10-CM | POA: Diagnosis not present

## 2020-11-02 DIAGNOSIS — I4892 Unspecified atrial flutter: Secondary | ICD-10-CM | POA: Diagnosis not present

## 2020-11-02 DIAGNOSIS — R52 Pain, unspecified: Secondary | ICD-10-CM | POA: Diagnosis not present

## 2020-11-02 DIAGNOSIS — Z8249 Family history of ischemic heart disease and other diseases of the circulatory system: Secondary | ICD-10-CM | POA: Diagnosis not present

## 2020-11-02 DIAGNOSIS — R69 Illness, unspecified: Secondary | ICD-10-CM | POA: Diagnosis not present

## 2020-11-02 DIAGNOSIS — R197 Diarrhea, unspecified: Secondary | ICD-10-CM | POA: Diagnosis not present

## 2020-11-02 DIAGNOSIS — R109 Unspecified abdominal pain: Secondary | ICD-10-CM | POA: Diagnosis not present

## 2020-11-02 DIAGNOSIS — N390 Urinary tract infection, site not specified: Secondary | ICD-10-CM | POA: Diagnosis not present

## 2020-11-02 DIAGNOSIS — K6389 Other specified diseases of intestine: Secondary | ICD-10-CM | POA: Diagnosis not present

## 2020-11-02 DIAGNOSIS — K635 Polyp of colon: Secondary | ICD-10-CM | POA: Diagnosis present

## 2020-11-02 DIAGNOSIS — Z681 Body mass index (BMI) 19 or less, adult: Secondary | ICD-10-CM | POA: Diagnosis not present

## 2020-11-02 DIAGNOSIS — Z23 Encounter for immunization: Secondary | ICD-10-CM

## 2020-11-02 DIAGNOSIS — Z7901 Long term (current) use of anticoagulants: Secondary | ICD-10-CM

## 2020-11-02 DIAGNOSIS — Z7401 Bed confinement status: Secondary | ICD-10-CM | POA: Diagnosis not present

## 2020-11-02 DIAGNOSIS — R55 Syncope and collapse: Secondary | ICD-10-CM

## 2020-11-02 DIAGNOSIS — Y846 Urinary catheterization as the cause of abnormal reaction of the patient, or of later complication, without mention of misadventure at the time of the procedure: Secondary | ICD-10-CM | POA: Diagnosis not present

## 2020-11-02 DIAGNOSIS — Z20822 Contact with and (suspected) exposure to covid-19: Secondary | ICD-10-CM | POA: Diagnosis not present

## 2020-11-02 DIAGNOSIS — K8020 Calculus of gallbladder without cholecystitis without obstruction: Secondary | ICD-10-CM | POA: Diagnosis not present

## 2020-11-02 DIAGNOSIS — K922 Gastrointestinal hemorrhage, unspecified: Secondary | ICD-10-CM | POA: Diagnosis not present

## 2020-11-02 DIAGNOSIS — T82898A Other specified complication of vascular prosthetic devices, implants and grafts, initial encounter: Secondary | ICD-10-CM

## 2020-11-02 DIAGNOSIS — N2889 Other specified disorders of kidney and ureter: Secondary | ICD-10-CM | POA: Diagnosis not present

## 2020-11-02 DIAGNOSIS — R54 Age-related physical debility: Secondary | ICD-10-CM | POA: Diagnosis present

## 2020-11-02 DIAGNOSIS — Z452 Encounter for adjustment and management of vascular access device: Secondary | ICD-10-CM | POA: Diagnosis not present

## 2020-11-02 DIAGNOSIS — T82594A Other mechanical complication of infusion catheter, initial encounter: Secondary | ICD-10-CM | POA: Diagnosis not present

## 2020-11-02 DIAGNOSIS — E860 Dehydration: Secondary | ICD-10-CM | POA: Diagnosis not present

## 2020-11-02 DIAGNOSIS — A09 Infectious gastroenteritis and colitis, unspecified: Secondary | ICD-10-CM | POA: Diagnosis present

## 2020-11-02 DIAGNOSIS — Z515 Encounter for palliative care: Secondary | ICD-10-CM | POA: Diagnosis not present

## 2020-11-02 DIAGNOSIS — R0689 Other abnormalities of breathing: Secondary | ICD-10-CM | POA: Diagnosis not present

## 2020-11-02 DIAGNOSIS — K52831 Collagenous colitis: Secondary | ICD-10-CM | POA: Diagnosis present

## 2020-11-02 DIAGNOSIS — I4891 Unspecified atrial fibrillation: Secondary | ICD-10-CM | POA: Diagnosis present

## 2020-11-02 LAB — COMPREHENSIVE METABOLIC PANEL
ALT: 14 U/L (ref 0–44)
AST: 15 U/L (ref 15–41)
Albumin: 1.9 g/dL — ABNORMAL LOW (ref 3.5–5.0)
Alkaline Phosphatase: 46 U/L (ref 38–126)
Anion gap: 10 (ref 5–15)
BUN: 46 mg/dL — ABNORMAL HIGH (ref 8–23)
CO2: 23 mmol/L (ref 22–32)
Calcium: 7.4 mg/dL — ABNORMAL LOW (ref 8.9–10.3)
Chloride: 99 mmol/L (ref 98–111)
Creatinine, Ser: 2.92 mg/dL — ABNORMAL HIGH (ref 0.61–1.24)
GFR, Estimated: 22 mL/min — ABNORMAL LOW (ref >60)
Glucose, Bld: 157 mg/dL — ABNORMAL HIGH (ref 70–99)
Potassium: 2.9 mmol/L — ABNORMAL LOW (ref 3.5–5.1)
Sodium: 132 mmol/L — ABNORMAL LOW (ref 135–145)
Total Bilirubin: 0.5 mg/dL (ref 0.3–1.2)
Total Protein: 4.5 g/dL — ABNORMAL LOW (ref 6.5–8.1)

## 2020-11-02 LAB — CBC WITH DIFFERENTIAL/PLATELET
Abs Immature Granulocytes: 0.08 10*3/uL — ABNORMAL HIGH (ref 0.00–0.07)
Basophils Absolute: 0.1 10*3/uL (ref 0.0–0.1)
Basophils Relative: 1 %
Eosinophils Absolute: 0 10*3/uL (ref 0.0–0.5)
Eosinophils Relative: 0 %
HCT: 27.5 % — ABNORMAL LOW (ref 39.0–52.0)
Hemoglobin: 8.6 g/dL — ABNORMAL LOW (ref 13.0–17.0)
Immature Granulocytes: 1 %
Lymphocytes Relative: 5 %
Lymphs Abs: 0.5 10*3/uL — ABNORMAL LOW (ref 0.7–4.0)
MCH: 28.8 pg (ref 26.0–34.0)
MCHC: 31.3 g/dL (ref 30.0–36.0)
MCV: 92 fL (ref 80.0–100.0)
Monocytes Absolute: 1.2 10*3/uL — ABNORMAL HIGH (ref 0.1–1.0)
Monocytes Relative: 12 %
Neutro Abs: 8.4 10*3/uL — ABNORMAL HIGH (ref 1.7–7.7)
Neutrophils Relative %: 81 %
Platelets: 214 10*3/uL (ref 150–400)
RBC: 2.99 MIL/uL — ABNORMAL LOW (ref 4.22–5.81)
RDW: 18.7 % — ABNORMAL HIGH (ref 11.5–15.5)
WBC Morphology: INCREASED
WBC: 10.3 10*3/uL (ref 4.0–10.5)
nRBC: 0 % (ref 0.0–0.2)

## 2020-11-02 LAB — POC OCCULT BLOOD, ED: Fecal Occult Bld: POSITIVE — AB

## 2020-11-02 LAB — C DIFFICILE QUICK SCREEN W PCR REFLEX
C Diff antigen: NEGATIVE
C Diff interpretation: NOT DETECTED
C Diff toxin: NEGATIVE

## 2020-11-02 LAB — TYPE AND SCREEN
ABO/RH(D): A POS
Antibody Screen: NEGATIVE

## 2020-11-02 LAB — MRSA PCR SCREENING: MRSA by PCR: NEGATIVE

## 2020-11-02 LAB — LACTIC ACID, PLASMA: Lactic Acid, Venous: 2.6 mmol/L (ref 0.5–1.9)

## 2020-11-02 MED ORDER — AMIODARONE HCL 200 MG PO TABS
200.0000 mg | ORAL_TABLET | Freq: Every day | ORAL | Status: DC
  Administered 2020-11-02 – 2020-12-11 (×39): 200 mg via ORAL
  Filled 2020-11-02 (×40): qty 1

## 2020-11-02 MED ORDER — PREDNISONE 20 MG PO TABS
30.0000 mg | ORAL_TABLET | Freq: Every day | ORAL | Status: DC
  Administered 2020-11-03 – 2020-11-07 (×5): 30 mg via ORAL
  Filled 2020-11-02 (×6): qty 1

## 2020-11-02 MED ORDER — METOPROLOL TARTRATE 25 MG PO TABS
12.5000 mg | ORAL_TABLET | Freq: Two times a day (BID) | ORAL | Status: DC
  Administered 2020-11-02 – 2020-11-28 (×45): 12.5 mg via ORAL
  Filled 2020-11-02 (×52): qty 1

## 2020-11-02 MED ORDER — SODIUM CHLORIDE 0.9% FLUSH
3.0000 mL | INTRAVENOUS | Status: DC | PRN
  Administered 2020-11-17 – 2020-11-26 (×2): 3 mL via INTRAVENOUS

## 2020-11-02 MED ORDER — ONDANSETRON HCL 4 MG/2ML IJ SOLN
4.0000 mg | Freq: Once | INTRAMUSCULAR | Status: AC
  Administered 2020-11-02: 4 mg via INTRAVENOUS
  Filled 2020-11-02: qty 2

## 2020-11-02 MED ORDER — ACETAMINOPHEN 650 MG RE SUPP: 650.0000 mg | Freq: Four times a day (QID) | RECTAL | Status: DC | PRN

## 2020-11-02 MED ORDER — ONDANSETRON HCL 4 MG PO TABS
4.0000 mg | ORAL_TABLET | Freq: Four times a day (QID) | ORAL | Status: DC | PRN
  Administered 2020-12-02 – 2020-12-03 (×2): 4 mg via ORAL
  Filled 2020-11-02 (×2): qty 1

## 2020-11-02 MED ORDER — FLUDROCORTISONE ACETATE 0.1 MG PO TABS
0.1000 mg | ORAL_TABLET | Freq: Two times a day (BID) | ORAL | Status: DC
  Filled 2020-11-02 (×7): qty 1

## 2020-11-02 MED ORDER — SODIUM CHLORIDE 0.9% FLUSH
3.0000 mL | Freq: Two times a day (BID) | INTRAVENOUS | Status: DC
  Administered 2020-11-02 – 2020-12-09 (×72): 3 mL via INTRAVENOUS

## 2020-11-02 MED ORDER — ACETAMINOPHEN 325 MG PO TABS
650.0000 mg | ORAL_TABLET | Freq: Four times a day (QID) | ORAL | Status: DC | PRN
  Administered 2020-11-11 – 2020-11-23 (×7): 650 mg via ORAL
  Filled 2020-11-02 (×7): qty 2

## 2020-11-02 MED ORDER — PANTOPRAZOLE SODIUM 40 MG PO TBEC
40.0000 mg | DELAYED_RELEASE_TABLET | Freq: Every day | ORAL | Status: DC
  Administered 2020-11-03 – 2020-11-07 (×5): 40 mg via ORAL
  Filled 2020-11-02 (×5): qty 1

## 2020-11-02 MED ORDER — SIMVASTATIN 20 MG PO TABS
20.0000 mg | ORAL_TABLET | Freq: Every evening | ORAL | Status: DC
  Administered 2020-11-02 – 2020-12-10 (×38): 20 mg via ORAL
  Filled 2020-11-02 (×13): qty 1
  Filled 2020-11-02: qty 2
  Filled 2020-11-02 (×13): qty 1
  Filled 2020-11-02: qty 2
  Filled 2020-11-02 (×3): qty 1
  Filled 2020-11-02: qty 2
  Filled 2020-11-02 (×7): qty 1

## 2020-11-02 MED ORDER — SODIUM CHLORIDE 0.9 % IV BOLUS
500.0000 mL | Freq: Once | INTRAVENOUS | Status: AC
  Administered 2020-11-02: 500 mL via INTRAVENOUS

## 2020-11-02 MED ORDER — MIDODRINE HCL 5 MG PO TABS
10.0000 mg | ORAL_TABLET | Freq: Three times a day (TID) | ORAL | Status: DC
  Administered 2020-11-02 – 2020-12-11 (×93): 10 mg via ORAL
  Filled 2020-11-02 (×97): qty 2

## 2020-11-02 MED ORDER — ONDANSETRON HCL 4 MG/2ML IJ SOLN
4.0000 mg | Freq: Four times a day (QID) | INTRAMUSCULAR | Status: DC | PRN
  Administered 2020-11-22 – 2020-12-05 (×6): 4 mg via INTRAVENOUS
  Filled 2020-11-02 (×7): qty 2

## 2020-11-02 MED ORDER — CHLORHEXIDINE GLUCONATE CLOTH 2 % EX PADS
6.0000 | MEDICATED_PAD | Freq: Every day | CUTANEOUS | Status: DC
  Administered 2020-11-04 – 2020-12-11 (×21): 6 via TOPICAL

## 2020-11-02 MED ORDER — SODIUM CHLORIDE 0.9 % IV SOLN: 250.0000 mL | INTRAVENOUS | Status: DC | PRN

## 2020-11-02 MED ORDER — POTASSIUM CHLORIDE CRYS ER 20 MEQ PO TBCR
40.0000 meq | EXTENDED_RELEASE_TABLET | Freq: Once | ORAL | Status: AC
  Administered 2020-11-02: 40 meq via ORAL
  Filled 2020-11-02: qty 2

## 2020-11-02 MED ORDER — FLUDROCORTISONE ACETATE 0.1 MG PO TABS
0.1000 mg | ORAL_TABLET | Freq: Every day | ORAL | Status: DC
  Administered 2020-11-03 – 2020-11-18 (×16): 0.1 mg via ORAL
  Filled 2020-11-02 (×19): qty 1

## 2020-11-02 NOTE — ED Triage Notes (Signed)
SNF reports that patient had one episode of syncope with apnea prior to them calling EMS. Patient was getting up to go to dialysis when event occurred. EMS reports orthostatic blood pressures. On arrival SBP was in 70's.

## 2020-11-02 NOTE — ED Notes (Signed)
Dr. Manuella Ghazi made aware of manual BP of 112/64 and watery diarrhea x2 with foul smell.

## 2020-11-02 NOTE — ED Notes (Signed)
Date and time results received: 11/02/20 1433 (use smartphrase ".now" to insert current time)  Test: Lactic Acid Critical Value: 2.6  Name of Provider Notified: Dr. Manuella Ghazi  Orders Received? Or Actions Taken?: None at this time.

## 2020-11-02 NOTE — H&P (Signed)
History and Physical    [name] DZH:[id number] DOB: [birth year] DOA: 11/02/2020  PCP: Patient, No Pcp Per   Patient coming from: [facility] SNF  Chief Complaint:  Syncopal episode   HPI: [name] is a 69 y.o. male with medical history significant for type 2 diabetes, CKD stage V recently initiated on hemodialysis MWF, atrial fibrillation/flutter on amiodarone and metoprolol and Eliquis, recurrent orthostatic hypotension, dyslipidemia, and recent discharge on 12/2 for colitis with associated adynamic ileus and acute blood loss anemia who presented back to the ED today after he was noted to have an episode of syncope while getting up to go to hemodialysis from his SNF.  On arrival to the ED his systolic blood pressures were in the 70 mmHg range.  He is noted to have some diarrhea, but he has recently completed course of ciprofloxacin and Flagyl by 11/23.  He was noted to have nonspecific colitis on pathology after recent colonoscopy on 11/17 and was started on prednisone taper which he continues to take at this time.   ED Course: Patient had received 250 mL of normal saline fluid bolus with improvement in blood pressure readings.  Manual blood pressures confirm systolics in the low 419 range and heart rates fluctuate in the low 100 range.  He denies any palpitations or chest pains or shortness of breath.  Laboratory data with stable hemoglobin levels noted and he is noted to be hypokalemic with potassium 2.9 and he has a lactic acid level 2.6.  Covid testing ordered and pending.  Review of Systems: All others reviewed and otherwise negative except as noted above.  Past Medical History:  Diagnosis Date  . Diabetes mellitus, type 2 ([place])   . ESRD on hemodialysis ([place])   . GI bleed   . Orthostatic hypotension   . PAF (paroxysmal atrial fibrillation) ([place])     Past Surgical History:  Procedure Laterality Date  . BIOPSY  10/17/2020   Procedure: BIOPSY;  Surgeon: Rogene Houston, MD;   [facility];  Service: Endoscopy;;  ascending and descending  . COLONOSCOPY WITH PROPOFOL N/A 10/17/2020   Procedure: COLONOSCOPY WITH PROPOFOL;  Surgeon: Rogene Houston, MD;  [facility];  Service: Endoscopy;  Laterality: N/A;  . EXTERNAL FIXATION LEG Right 06/08/2018   Procedure: EXTERNAL FIXATION COMMINUTED FIBULA FRACTURE;  Surgeon: Netta Cedars, MD;  Location: [place];  Service: Orthopedics;  Laterality: Right;  . EXTERNAL FIXATION REMOVAL Right 06/11/2018   Procedure: REMOVAL EXTERNAL FIXATION LEG;  Surgeon: Shona Needles, MD;  Location: [place];  Service: Orthopedics;  Laterality: Right;  . I & D EXTREMITY Right 06/08/2018   Procedure: IRRIGATION AND DEBRIDEMENT OPEN TIBIA FRACTURE;  Surgeon: Netta Cedars, MD;  Location: [place];  Service: Orthopedics;  Laterality: Right;  . I & D EXTREMITY Right 06/11/2018   Procedure: IRRIGATION AND DEBRIDEMENT EXTREMITY;  Surgeon: Shona Needles, MD;  Location: [place];  Service: Orthopedics;  Laterality: Right;  . NOSE SURGERY    . ORIF ANKLE FRACTURE Right 06/08/2018   Procedure: OPEN REDUCTION INTERNAL FIXATION (ORIF) MEDIAL MALLEOLUS;  Surgeon: Netta Cedars, MD;  Location: [place];  Service: Orthopedics;  Laterality: Right;  . ORIF ANKLE FRACTURE Right 06/11/2018   Procedure: OPEN REDUCTION INTERNAL FIXATION (ORIF) ANKLE FRACTURE;  Surgeon: Shona Needles, MD;  Location: [place];  Service: Orthopedics;  Laterality: Right;  . TIBIA DEBRIDEMENT Right 06/08/2018     reports that he has never smoked. He has never used  smokeless tobacco. He reports current alcohol use. He reports that he does not use drugs.  No Known Allergies  Family History  Problem Relation Age of Onset  . Hypertension Father     Prior to Admission medications   Medication Sig Start Date End Date Taking? Authorizing Provider  amiodarone (PACERONE) 200 MG tablet Take 200 mg by mouth daily. 10/01/20  Yes [provider]  metoprolol tartrate (LOPRESSOR) 25  MG tablet Take 0.5 tablets (12.5 mg total) by mouth 2 (two) times daily. 11/01/20  Yes Dessa Phi, DO  midodrine (PROAMATINE) 10 MG tablet Take 1 tablet (10 mg total) by mouth with breakfast, with lunch, and with evening meal. 11/01/20  Yes Dessa Phi, DO  ondansetron (ZOFRAN) 4 MG tablet Take 4 mg by mouth every 6 (six) hours as needed for nausea.  10/09/20  Yes [provider]  pantoprazole (PROTONIX) 40 MG tablet Take 40 mg by mouth daily.   Yes [provider]  promethazine (PHENERGAN) 25 MG/ML injection Inject 25 mg into the muscle every 4 (four) hours as needed for nausea or vomiting.   Yes [provider]  simvastatin (ZOCOR) 20 MG tablet Take 20 mg by mouth every evening. 04/30/18  Yes [provider]  predniSONE (DELTASONE) 10 MG tablet Take 3 tablets (30 mg total) by mouth daily with breakfast for 7 days. Then wean by 10mg  weekly. Patient taking differently: Take 30 mg by mouth daily with breakfast. For 7 days 11/02/20 11/09/20  Dessa Phi, DO    Physical Exam: Vitals:   11/02/20 1300 11/02/20 1315 11/02/20 1330 11/02/20 1415  BP: 110/74 110/74 109/75 116/72  Pulse: 93  93 (!) 128  Resp: 17 19 16 17   Temp:      TempSrc:      SpO2: 94%  95% 93%  Weight:      Height:        Constitutional: NAD, calm, comfortable Vitals:   11/02/20 1300 11/02/20 1315 11/02/20 1330 11/02/20 1415  BP: 110/74 110/74 109/75 116/72  Pulse: 93  93 (!) 128  Resp: 17 19 16 17   Temp:      TempSrc:      SpO2: 94%  95% 93%  Weight:      Height:       Eyes: lids and conjunctivae normal ENMT: Mucous membranes are moist.  Neck: normal, supple Respiratory: clear to auscultation bilaterally. Normal respiratory effort. No accessory muscle use.  Dialysis catheter present to right IJ.  Clean dry and intact. Cardiovascular: Regular rate and rhythm, no murmurs. No extremity edema. Abdomen: no tenderness, no distention. Bowel sounds positive.  Musculoskeletal:  No  joint deformity upper and lower extremities.   Skin: no rashes, lesions, ulcers.  Psychiatric: Flat affect.  Labs on Admission: I have personally reviewed following labs and imaging studies  CBC: Recent Labs  Lab 10/27/20 0817 10/29/20 0820 10/31/20 0849 11/01/20 0938 11/02/20 1209  WBC 11.6* 12.1* 8.8 4.3 10.3  NEUTROABS  --   --   --   --  8.4*  HGB 8.3* 9.5* 8.5* 7.5* 8.6*  HCT 25.6* 30.1* 27.3* 23.6* 27.5*  MCV 89.8 91.5 91.9 91.1 92.0  PLT 281 309 240 213 062   Basic Metabolic Panel: Recent Labs  Lab 10/29/20 0820 10/31/20 0849 11/01/20 0938 11/02/20 1209  NA 130* 127* 129* 132*  K 4.2 3.6 3.0* 2.9*  CL 96* 93* 95* 99  CO2 25 24 26 23   GLUCOSE 161* 176* 205* 157*  BUN 51*  56* 36* 46*  CREATININE 3.97* 3.50* 2.51* 2.92*  CALCIUM 8.2* 7.9* 7.5* 7.4*  PHOS 1.7*  --   --   --    GFR: Estimated Creatinine Clearance: 24.9 mL/min (A) (by C-G formula based on SCr of 2.92 mg/dL (H)). Liver Function Tests: Recent Labs  Lab 10/29/20 0820 11/02/20 1209  AST  --  15  ALT  --  14  ALKPHOS  --  46  BILITOT  --  0.5  PROT  --  4.5*  ALBUMIN 2.3* 1.9*   No results for input(s): LIPASE, AMYLASE in the last 168 hours. No results for input(s): AMMONIA in the last 168 hours. Coagulation Profile: No results for input(s): INR, PROTIME in the last 168 hours. Cardiac Enzymes: No results for input(s): CKTOTAL, CKMB, CKMBINDEX, TROPONINI in the last 168 hours. BNP (last 3 results) No results for input(s): PROBNP in the last 8760 hours. HbA1C: No results for input(s): HGBA1C in the last 72 hours. CBG: Recent Labs  Lab 10/28/20 1844  GLUCAP 267*   Lipid Profile: No results for input(s): CHOL, HDL, LDLCALC, TRIG, CHOLHDL, LDLDIRECT in the last 72 hours. Thyroid Function Tests: No results for input(s): TSH, T4TOTAL, FREET4, T3FREE, THYROIDAB in the last 72 hours. Anemia Panel: No results for input(s): VITAMINB12, FOLATE, FERRITIN, TIBC, IRON, RETICCTPCT in the last 72  hours. Urine analysis:    Component Value Date/Time   COLORURINE YELLOW 10/12/2020 1622   APPEARANCEUR CLEAR 10/12/2020 1622   LABSPEC 1.008 10/12/2020 1622   PHURINE 8.0 10/12/2020 1622   GLUCOSEU >=500 (A) 10/12/2020 1622   HGBUR NEGATIVE 10/12/2020 1622   BILIRUBINUR NEGATIVE 10/12/2020 1622   KETONESUR NEGATIVE 10/12/2020 1622   PROTEINUR 100 (A) 10/12/2020 1622   NITRITE NEGATIVE 10/12/2020 1622   LEUKOCYTESUR NEGATIVE 10/12/2020 1622    Radiological Exams on Admission: No results found.  EKG: Independently reviewed. Aflutter 143 bpm.  Assessment/Plan Active Problems:   Syncope due to orthostatic hypotension    Syncope secondary to recurrent orthostatic hypotension -Continue midodrine as previous -Started on Florinef -Lower extremity stockings -Bolus for recurrent hypotension and small amounts of 250 mL as needed -Plan for hemodialysis per nephrology -Palliative care consultation due to recurrent episodes and inability to adequately hemodialyze.  Need to discuss goals of care.  Lactic acidosis likely related to above -Likely due to soft blood pressure readings with no signs of other acute infection noted  Ongoing colitis with diarrhea -Continue prednisone 30 mg daily for 1 week and then taper by 10 mg/week thereafter -Recent treatment with ciprofloxacin and Flagyl for 10 days completed -If liquid stool persists, plan to check C. Difficile  Hypokalemia -Likely secondary to diarrhea above -Plan to adjust per hemodialysis -Continue telemetry monitoring  ESRD on HD MWF -Appreciate nephrology evaluation for hemodialysis while inpatient  Chronic anemia-stable -Received 5 units of PRBCs during his last admission -Plan to hold apixaban for now and maintain on SCDs -Recheck CBC in a.m. with no overt bleeding identified  Paroxysmal atrial fibrillation/flutter -Continue close monitoring on telemetry -Resume home amiodarone and hold metoprolol while blood pressures  are soft -Asymptomatic  Dyslipidemia -Continue Zocor  Hypoalbuminemia -Likely mild to moderate protein calorie malnutrition -Dietitian consultation  DVT prophylaxis: SCDs Code Status: Full Family Communication: None at bedside, patient states he will call Disposition Plan:Admit for evaluation of hypotension and hemodialysis Consults called:Nephrology Admission status: Inpatient, SDU   Tyara Dassow D Jarissa Sheriff DO [facility]  If 7PM-7AM, please contact night-coverage [email or web address]  11/02/2020, 2:42 PM

## 2020-11-02 NOTE — ED Provider Notes (Signed)
[facility] EMERGENCY DEPARTMENT Provider Note   CSN: [id number] Arrival date & time: 11/02/20  1135     History Chief Complaint  Patient presents with  . Loss of Consciousness    [name] is a 69 y.o. male.  Patient recently just was discharged from the hospital for colitis.  He has had a nursing home and they tried to get him up to go to dialysis and he passed out.  His blood pressure was 70 and he was tachycardic in atrial fibrillation 140.  When he arrived the emergency department his pressure was 90 with still a rapid heart rate.  The history is provided by the patient and the nursing home. No language interpreter was used.  Loss of Consciousness Episode history:  Single Most recent episode:  Today Timing:  Rare Progression:  Resolved Chronicity:  New Context: not blood draw   Witnessed: no   Relieved by:  Nothing Worsened by:  Nothing Ineffective treatments:  None tried Associated symptoms: anxiety, dizziness and palpitations   Associated symptoms: no chest pain, no headaches and no seizures   Risk factors: no congenital heart disease        Past Medical History:  Diagnosis Date  . Diabetes mellitus, type 2 ([place])   . ESRD on hemodialysis ([place])   . GI bleed   . Orthostatic hypotension   . PAF (paroxysmal atrial fibrillation) [facility])     Patient Active Problem List   Diagnosis Date Noted  . Rectal bleeding   . Acute on chronic anemia   . Diarrhea   . Orthostatic hypotension 10/13/2020  . Chronic diarrhea 10/13/2020  . Colitis presumed infectious 10/13/2020  . Pressure injury of skin 10/13/2020  . ESRD on dialysis ([place]) 10/12/2020  . Unspecified atrial fibrillation ([place]) 10/12/2020  . Syncope 10/12/2020  . Small bowel obstruction ([place])   . Type 2 diabetes mellitus ([place]) 06/11/2018  . Peripheral neuropathy 06/11/2018  . Ankle syndesmosis disruption, right, initial encounter 06/11/2018  . AKI (acute kidney injury) ([place])   . Dehydration   . Open right  ankle fracture 06/08/2018  . Fracture, ankle 06/08/2018  . Open displaced fracture of medial malleolus of tibia, type III 06/08/2018    Past Surgical History:  Procedure Laterality Date  . BIOPSY  10/17/2020   Procedure: BIOPSY;  Surgeon: Rogene Houston, MD;  [facility];  Service: Endoscopy;;  ascending and descending  . COLONOSCOPY WITH PROPOFOL N/A 10/17/2020   Procedure: COLONOSCOPY WITH PROPOFOL;  Surgeon: Rogene Houston, MD;  [facility];  Service: Endoscopy;  Laterality: N/A;  . EXTERNAL FIXATION LEG Right 06/08/2018   Procedure: EXTERNAL FIXATION COMMINUTED FIBULA FRACTURE;  Surgeon: Netta Cedars, MD;  Location: [place];  Service: Orthopedics;  Laterality: Right;  . EXTERNAL FIXATION REMOVAL Right 06/11/2018   Procedure: REMOVAL EXTERNAL FIXATION LEG;  Surgeon: Shona Needles, MD;  Location: [place];  Service: Orthopedics;  Laterality: Right;  . I & D EXTREMITY Right 06/08/2018   Procedure: IRRIGATION AND DEBRIDEMENT OPEN TIBIA FRACTURE;  Surgeon: Netta Cedars, MD;  Location: [place];  Service: Orthopedics;  Laterality: Right;  . I & D EXTREMITY Right 06/11/2018   Procedure: IRRIGATION AND DEBRIDEMENT EXTREMITY;  Surgeon: Shona Needles, MD;  Location: [place];  Service: Orthopedics;  Laterality: Right;  . NOSE SURGERY    . ORIF ANKLE FRACTURE Right 06/08/2018   Procedure: OPEN REDUCTION INTERNAL FIXATION (ORIF) MEDIAL MALLEOLUS;  Surgeon: Netta Cedars, MD;  Location: [place];  Service: Orthopedics;  Laterality: Right;  . ORIF ANKLE FRACTURE Right 06/11/2018   Procedure: OPEN REDUCTION INTERNAL FIXATION (ORIF) ANKLE FRACTURE;  Surgeon: Shona Needles, MD;  Location: [place];  Service: Orthopedics;  Laterality: Right;  . TIBIA DEBRIDEMENT Right 06/08/2018       Family History  Problem Relation Age of Onset  . Hypertension Father     Social History   Tobacco Use  . Smoking status: Never Smoker  . Smokeless tobacco: Never Used  Vaping Use  . Vaping Use: Never  used  Substance Use Topics  . Alcohol use: Yes    Comment: occasional  . Drug use: Never    Home Medications Prior to Admission medications   Medication Sig Start Date End Date Taking? Authorizing Provider  amiodarone (PACERONE) 200 MG tablet Take 200 mg by mouth daily. 10/01/20  Yes [provider]  metoprolol tartrate (LOPRESSOR) 25 MG tablet Take 0.5 tablets (12.5 mg total) by mouth 2 (two) times daily. 11/01/20  Yes Dessa Phi, DO  midodrine (PROAMATINE) 10 MG tablet Take 1 tablet (10 mg total) by mouth with breakfast, with lunch, and with evening meal. 11/01/20  Yes Dessa Phi, DO  ondansetron (ZOFRAN) 4 MG tablet Take 4 mg by mouth every 6 (six) hours as needed for nausea.  10/09/20  Yes [provider]  pantoprazole (PROTONIX) 40 MG tablet Take 40 mg by mouth daily.   Yes [provider]  promethazine (PHENERGAN) 25 MG/ML injection Inject 25 mg into the muscle every 4 (four) hours as needed for nausea or vomiting.   Yes [provider]  simvastatin (ZOCOR) 20 MG tablet Take 20 mg by mouth every evening. 04/30/18  Yes [provider]  predniSONE (DELTASONE) 10 MG tablet Take 3 tablets (30 mg total) by mouth daily with breakfast for 7 days. Then wean by 10mg  weekly. Patient taking differently: Take 30 mg by mouth daily with breakfast. For 7 days 11/02/20 11/09/20  Dessa Phi, DO    Allergies    Patient has no known allergies.  Review of Systems   Review of Systems  Constitutional: Negative for appetite change and fatigue.  HENT: Negative for congestion, ear discharge and sinus pressure.   Eyes: Negative for discharge.  Respiratory: Negative for cough.   Cardiovascular: Positive for palpitations and syncope. Negative for chest pain.  Gastrointestinal: Negative for abdominal pain and diarrhea.  Genitourinary: Negative for frequency and hematuria.  Musculoskeletal: Negative for back pain.  Skin: Negative for rash.  Neurological:  Positive for dizziness. Negative for seizures and headaches.  Psychiatric/Behavioral: Negative for hallucinations.    Physical Exam Updated Vital Signs BP 109/75   Pulse 93   Temp 98.1 F (36.7 C) (Oral)   Resp 16   Ht 6\' 2"  (1.88 m)   Wt 74.8 kg   SpO2 95%   BMI 21.18 kg/m   Physical Exam Vitals reviewed.  Constitutional:      Appearance: He is well-developed.  HENT:     Head: Normocephalic.     Nose: Nose normal.  Eyes:     General: No scleral icterus.    Conjunctiva/sclera: Conjunctivae normal.  Neck:     Thyroid: No thyromegaly.  Cardiovascular:     Rate and Rhythm: Tachycardia present. Rhythm irregular.     Heart sounds: No murmur heard.  No friction rub. No gallop.   Pulmonary:     Breath sounds: No stridor. No wheezing or rales.  Chest:     Chest wall: No  tenderness.  Abdominal:     General: There is no distension.     Tenderness: There is no abdominal tenderness. There is no rebound.  Musculoskeletal:        General: Normal range of motion.     Cervical back: Neck supple.  Lymphadenopathy:     Cervical: No cervical adenopathy.  Skin:    Findings: No erythema or rash.  Neurological:     Mental Status: He is alert and oriented to person, place, and time.     Motor: No abnormal muscle tone.     Coordination: Coordination normal.  Psychiatric:        Behavior: Behavior normal.     ED Results / Procedures / Treatments   Labs (all labs ordered are listed, but only abnormal results are displayed) Labs Reviewed  CBC WITH DIFFERENTIAL/PLATELET - Abnormal; Notable for the following components:      Result Value   RBC 2.99 (*)    Hemoglobin 8.6 (*)    HCT 27.5 (*)    RDW 18.7 (*)    Neutro Abs 8.4 (*)    Lymphs Abs 0.5 (*)    Monocytes Absolute 1.2 (*)    Abs Immature Granulocytes 0.08 (*)    All other components within normal limits  COMPREHENSIVE METABOLIC PANEL - Abnormal; Notable for the following components:   Sodium 132 (*)    Potassium 2.9  (*)    Glucose, Bld 157 (*)    BUN 46 (*)    Creatinine, Ser 2.92 (*)    Calcium 7.4 (*)    Total Protein 4.5 (*)    Albumin 1.9 (*)    GFR, Estimated 22 (*)    All other components within normal limits  POC OCCULT BLOOD, ED - Abnormal; Notable for the following components:   Fecal Occult Bld POSITIVE (*)    All other components within normal limits  LACTIC ACID, PLASMA  TYPE AND SCREEN    EKG EKG Interpretation  Date/Time:  Friday November 02 2020 11:45:05 EST Ventricular Rate:  143 PR Interval:    QRS Duration: 97 QT Interval:  312 QTC Calculation: 482 R Axis:   19 Text Interpretation: Atrial flutter with predominant 2:1 AV block Abnormal R-wave progression, late transition Left ventricular hypertrophy Inferior infarct, recent Lateral leads are also involved Confirmed by Milton Ferguson [phone]) on 11/02/2020 11:59:59 AM   Radiology No results found.  Procedures Procedures (including critical care time)  Medications Ordered in ED Medications  sodium chloride 0.9 % bolus 500 mL (0 mLs Intravenous Stopped 11/02/20 1241)  ondansetron (ZOFRAN) injection 4 mg (4 mg Intravenous Given 11/02/20 1216)   CRITICAL CARE Performed by: Milton Ferguson Total critical care time:45 minutes Critical care time was exclusive of separately billable procedures and treating other patients. Critical care was necessary to treat or prevent imminent or life-threatening deterioration. Critical care was time spent personally by me on the following activities: development of treatment plan with patient and/or surrogate as well as nursing, discussions with consultants, evaluation of patient's response to treatment, examination of patient, obtaining history from patient or surrogate, ordering and performing treatments and interventions, ordering and review of laboratory studies, ordering and review of radiographic studies, pulse oximetry and re-evaluation of patient's condition.  ED Course  I have  reviewed the triage vital signs and the nursing notes.  Pertinent labs & imaging results that were available during my care of the patient were reviewed by me and considered in my medical decision making (see chart  for details).    MDM Rules/Calculators/A&P                          Patient with colitis and syncope with rapid atrial fib that has improved.  He will be admitted to medicine.   Patient with severe dehydration and heme positive stool from the colitis.  He also has hypokalemia Final Clinical Impression(s) / ED Diagnoses Final diagnoses:  Syncope, unspecified syncope type    Rx / DC Orders ED Discharge Orders    None       Milton Ferguson, MD 11/05/20 0945

## 2020-11-03 LAB — CBC
HCT: 25.2 % — ABNORMAL LOW (ref 39.0–52.0)
Hemoglobin: 8 g/dL — ABNORMAL LOW (ref 13.0–17.0)
MCH: 29.2 pg (ref 26.0–34.0)
MCHC: 31.7 g/dL (ref 30.0–36.0)
MCV: 92 fL (ref 80.0–100.0)
Platelets: 196 10*3/uL (ref 150–400)
RBC: 2.74 MIL/uL — ABNORMAL LOW (ref 4.22–5.81)
RDW: 18.8 % — ABNORMAL HIGH (ref 11.5–15.5)
WBC: 5.2 10*3/uL (ref 4.0–10.5)
nRBC: 0 % (ref 0.0–0.2)

## 2020-11-03 LAB — COMPREHENSIVE METABOLIC PANEL
ALT: 12 U/L (ref 0–44)
AST: 11 U/L — ABNORMAL LOW (ref 15–41)
Albumin: 1.9 g/dL — ABNORMAL LOW (ref 3.5–5.0)
Alkaline Phosphatase: 47 U/L (ref 38–126)
Anion gap: 8 (ref 5–15)
BUN: 50 mg/dL — ABNORMAL HIGH (ref 8–23)
CO2: 24 mmol/L (ref 22–32)
Calcium: 7.6 mg/dL — ABNORMAL LOW (ref 8.9–10.3)
Chloride: 99 mmol/L (ref 98–111)
Creatinine, Ser: 3.22 mg/dL — ABNORMAL HIGH (ref 0.61–1.24)
GFR, Estimated: 20 mL/min — ABNORMAL LOW (ref >60)
Glucose, Bld: 90 mg/dL (ref 70–99)
Potassium: 3.5 mmol/L (ref 3.5–5.1)
Sodium: 131 mmol/L — ABNORMAL LOW (ref 135–145)
Total Bilirubin: 0.6 mg/dL (ref 0.3–1.2)
Total Protein: 4.4 g/dL — ABNORMAL LOW (ref 6.5–8.1)

## 2020-11-03 LAB — MAGNESIUM: Magnesium: 2.2 mg/dL (ref 1.7–2.4)

## 2020-11-03 MED ORDER — PROSOURCE PLUS PO LIQD
30.0000 mL | Freq: Two times a day (BID) | ORAL | Status: DC
  Filled 2020-11-03 (×5): qty 30

## 2020-11-03 MED ORDER — PENTAFLUOROPROP-TETRAFLUOROETH EX AERO: 1.0000 "application " | INHALATION_SPRAY | CUTANEOUS | Status: DC | PRN

## 2020-11-03 MED ORDER — LIDOCAINE-PRILOCAINE 2.5-2.5 % EX CREA: 1.0000 "application " | TOPICAL_CREAM | CUTANEOUS | Status: DC | PRN

## 2020-11-03 MED ORDER — SODIUM CHLORIDE 0.9 % IV SOLN: 100.0000 mL | INTRAVENOUS | Status: DC | PRN

## 2020-11-03 MED ORDER — ALTEPLASE 2 MG IJ SOLR
2.0000 mg | Freq: Once | INTRAMUSCULAR | Status: AC | PRN
  Administered 2020-11-03: 4 mg
  Filled 2020-11-03 (×3): qty 2

## 2020-11-03 MED ORDER — HEPARIN SODIUM (PORCINE) 1000 UNIT/ML DIALYSIS: 1000.0000 [IU] | INTRAMUSCULAR | Status: DC | PRN

## 2020-11-03 MED ORDER — ENSURE ENLIVE PO LIQD
237.0000 mL | Freq: Two times a day (BID) | ORAL | Status: DC
  Administered 2020-11-03 – 2020-12-10 (×42): 237 mL via ORAL

## 2020-11-03 MED ORDER — RENA-VITE PO TABS
1.0000 | ORAL_TABLET | Freq: Every day | ORAL | Status: DC
  Administered 2020-11-03 – 2020-12-10 (×38): 1 via ORAL
  Filled 2020-11-03 (×38): qty 1

## 2020-11-03 MED ORDER — LIDOCAINE HCL (PF) 1 % IJ SOLN: 5.0000 mL | INTRAMUSCULAR | Status: DC | PRN

## 2020-11-03 MED ORDER — HEPARIN SODIUM (PORCINE) 1000 UNIT/ML DIALYSIS
3800.0000 [IU] | INTRAMUSCULAR | Status: DC | PRN
  Administered 2020-11-05 – 2020-11-30 (×7): 3800 [IU] via INTRAVENOUS_CENTRAL
  Filled 2020-11-03 (×7): qty 4

## 2020-11-03 MED ORDER — LOPERAMIDE HCL 2 MG PO CAPS
2.0000 mg | ORAL_CAPSULE | Freq: Every day | ORAL | Status: DC
  Administered 2020-11-03 – 2020-11-05 (×3): 2 mg via ORAL
  Filled 2020-11-03 (×3): qty 1

## 2020-11-03 NOTE — Procedures (Signed)
   HEMODIALYSIS TREATMENT NOTE:  3 hour heparin-free HD completed via [facility] TDC.  Poor Qb - d/w Dr. Marval Regal - Activase instilled post-HD.  Linton Flemings, RN will perform HD Monday and she was also made aware.    Hemodynamically stable throughout session.  No sitting or standing post- pressures obtained d/t flexi-seal placement (pt's request).  Net UF 512 cc.  No changes from pre-HD assessment.  Rockwell Alexandria, RN

## 2020-11-03 NOTE — Progress Notes (Signed)
PROGRESS NOTE    RITESH OPARA  HGD:[id number] DOB: [birth year] DOA: 11/02/2020 PCP: Patient, No Pcp Per   Brief Narrative:  Per HPI: LIVINGSTON DENNER is a 69 y.o. male with medical history significant for type 2 diabetes, CKD stage V recently initiated on hemodialysis MWF, atrial fibrillation/flutter on amiodarone and metoprolol and Eliquis, recurrent orthostatic hypotension, dyslipidemia, and recent discharge on 12/2 for colitis with associated adynamic ileus and acute blood loss anemia who presented back to the ED today after he was noted to have an episode of syncope while getting up to go to hemodialysis from his SNF.  On arrival to the ED his systolic blood pressures were in the 70 mmHg range.  He is noted to have some diarrhea, but he has recently completed course of ciprofloxacin and Flagyl by 11/23.  He was noted to have nonspecific colitis on pathology after recent colonoscopy on 11/17 and was started on prednisone taper which he continues to take at this time.  Assessment & Plan:   Active Problems:   Syncope due to orthostatic hypotension  Syncope secondary to recurrent orthostatic hypotension -Continue midodrine as previous -Started on Florinef which appears to be helping -Lower extremity stockings with abdominal binder -Plan for hemodialysis per nephrology today and then Monday -Palliative care consultation due to recurrent episodes and inability to adequately hemodialyze.  Need to discuss goals of care.  Lactic acidosis likely related to above -Likely due to soft blood pressure readings with no signs of other acute infection noted  Ongoing colitis with diarrhea -Continue prednisone 30 mg daily for 1 week and then taper by 10 mg/week thereafter -Recent treatment with ciprofloxacin and Flagyl for 10 days completed -Repeat Cdiff negative -Imodium daily  ESRD on HD MWF -Appreciate nephrology evaluation for hemodialysis while inpatient  Chronic anemia-stable -Received 5 units  of PRBCs during his last admission -Plan to hold apixaban for now and maintain on SCDs -Recheck CBC in a.m. with no overt bleeding identified  Paroxysmal atrial fibrillation/flutter -Continue close monitoring on telemetry -Resume home amiodarone and hold metoprolol while blood pressures are soft -Asymptomatic  Dyslipidemia -Continue Zocor  Hypoalbuminemia -Likely mild to moderate protein calorie malnutrition -Dietitian consultation   DVT prophylaxis:SCDs Code Status: Full Family Communication: Pt will call; none at bedside Disposition Plan:  Status is: Inpatient  Remains inpatient appropriate because:IV treatments appropriate due to intensity of illness or inability to take PO and Inpatient level of care appropriate due to severity of illness   Dispo: The patient is from: SNF              Anticipated d/c is to: SNF              Anticipated d/c date is: 2 days              Patient currently is not medically stable to d/c.  Consultants:   Nephrology  Procedures:   See below  Antimicrobials:   None   Subjective: Patient seen and evaluated today with no new acute complaints or concerns. No acute concerns or events noted overnight.  Objective: Vitals:   11/03/20 1345 11/03/20 1400 11/03/20 1415 11/03/20 1430  BP: (!) 141/64 (!) 145/68 (!) 141/64 (!) 147/74  Pulse: 65 68 64 61  Resp: 17 18 13 13   Temp:      TempSrc:      SpO2:      Weight:      Height:        Intake/Output Summary (Last 24  hours) at 11/03/2020 1448 Last data filed at 11/03/2020 1300 Gross per 24 hour  Intake 1220 ml  Output 650 ml  Net 570 ml   Filed Weights   11/02/20 1154 11/02/20 1503 11/03/20 1230  Weight: 74.8 kg 70.8 kg 70.9 kg    Examination:  General exam: Appears calm and comfortable  Respiratory system: Clear to auscultation. Respiratory effort normal. Cardiovascular system: S1 & S2 heard, RRR. R IJ c/d/i. Gastrointestinal system: Abdomen is nondistended, soft and  nontender.  Central nervous system: Alert and oriented. Extremities: No edema Skin: No rashes, lesions or ulcers Psychiatry: Judgement and insight appear normal. Mood & affect appropriate.     Data Reviewed: I have personally reviewed following labs and imaging studies  CBC: Recent Labs  Lab 10/29/20 0820 10/31/20 0849 11/01/20 0938 11/02/20 1209 11/03/20 0416  WBC 12.1* 8.8 4.3 10.3 5.2  NEUTROABS  --   --   --  8.4*  --   HGB 9.5* 8.5* 7.5* 8.6* 8.0*  HCT 30.1* 27.3* 23.6* 27.5* 25.2*  MCV 91.5 91.9 91.1 92.0 92.0  PLT 309 240 213 214 829   Basic Metabolic Panel: Recent Labs  Lab 10/29/20 0820 10/31/20 0849 11/01/20 0938 11/02/20 1209 11/03/20 0416  NA 130* 127* 129* 132* 131*  K 4.2 3.6 3.0* 2.9* 3.5  CL 96* 93* 95* 99 99  CO2 25 24 26 23 24   GLUCOSE 161* 176* 205* 157* 90  BUN 51* 56* 36* 46* 50*  CREATININE 3.97* 3.50* 2.51* 2.92* 3.22*  CALCIUM 8.2* 7.9* 7.5* 7.4* 7.6*  MG  --   --   --   --  2.2  PHOS 1.7*  --   --   --   --    GFR: Estimated Creatinine Clearance: 21.4 mL/min (A) (by C-G formula based on SCr of 3.22 mg/dL (H)). Liver Function Tests: Recent Labs  Lab 10/29/20 0820 11/02/20 1209 11/03/20 0416  AST  --  15 11*  ALT  --  14 12  ALKPHOS  --  46 47  BILITOT  --  0.5 0.6  PROT  --  4.5* 4.4*  ALBUMIN 2.3* 1.9* 1.9*   No results for input(s): LIPASE, AMYLASE in the last 168 hours. No results for input(s): AMMONIA in the last 168 hours. Coagulation Profile: No results for input(s): INR, PROTIME in the last 168 hours. Cardiac Enzymes: No results for input(s): CKTOTAL, CKMB, CKMBINDEX, TROPONINI in the last 168 hours. BNP (last 3 results) No results for input(s): PROBNP in the last 8760 hours. HbA1C: No results for input(s): HGBA1C in the last 72 hours. CBG: Recent Labs  Lab 10/28/20 1844  GLUCAP 267*   Lipid Profile: No results for input(s): CHOL, HDL, LDLCALC, TRIG, CHOLHDL, LDLDIRECT in the last 72 hours. Thyroid Function  Tests: No results for input(s): TSH, T4TOTAL, FREET4, T3FREE, THYROIDAB in the last 72 hours. Anemia Panel: No results for input(s): VITAMINB12, FOLATE, FERRITIN, TIBC, IRON, RETICCTPCT in the last 72 hours. Sepsis Labs: Recent Labs  Lab 11/02/20 1357  LATICACIDVEN 2.6*    Recent Results (from the past 240 hour(s))  Resp Panel by RT-PCR (Flu A&B, Covid) Nasopharyngeal Swab     Status: None   Collection Time: 10/29/20  3:03 PM   Specimen: Nasopharyngeal Swab; Nasopharyngeal(NP) swabs in vial transport medium  Result Value Ref Range Status   SARS Coronavirus 2 by RT PCR NEGATIVE NEGATIVE Final    Comment: (NOTE) SARS-CoV-2 target nucleic acids are NOT DETECTED.  The SARS-CoV-2 RNA is generally  detectable in upper respiratory specimens during the acute phase of infection. The lowest concentration of SARS-CoV-2 viral copies this assay can detect is 138 copies/mL. A negative result does not preclude SARS-Cov-2 infection and should not be used as the sole basis for treatment or other patient management decisions. A negative result may occur with  improper specimen collection/handling, submission of specimen other than nasopharyngeal swab, presence of viral mutation(s) within the areas targeted by this assay, and inadequate number of viral copies(<138 copies/mL). A negative result must be combined with clinical observations, patient history, and epidemiological information. The expected result is Negative.  Fact Sheet for Patients:  EntrepreneurPulse.com.au  Fact Sheet for Healthcare Providers:  IncredibleEmployment.be  This test is no t yet approved or cleared by the [place] FDA and  has been authorized for detection and/or diagnosis of SARS-CoV-2 by FDA under an Emergency Use Authorization (EUA). This EUA will remain  in effect (meaning this test can be used) for the duration of the COVID-19 declaration under Section 564(b)(1) of the Act,  21 U.S.C.section 360bbb-3(b)(1), unless the authorization is terminated  or revoked sooner.       Influenza A by PCR NEGATIVE NEGATIVE Final   Influenza B by PCR NEGATIVE NEGATIVE Final    Comment: (NOTE) The Xpert Xpress SARS-CoV-2/FLU/RSV plus assay is intended as an aid in the diagnosis of influenza from Nasopharyngeal swab specimens and should not be used as a sole basis for treatment. Nasal washings and aspirates are unacceptable for Xpert Xpress SARS-CoV-2/FLU/RSV testing.  Fact Sheet for Patients: EntrepreneurPulse.com.au  Fact Sheet for Healthcare Providers: IncredibleEmployment.be  This test is not yet approved or cleared by the [place] FDA and has been authorized for detection and/or diagnosis of SARS-CoV-2 by FDA under an Emergency Use Authorization (EUA). This EUA will remain in effect (meaning this test can be used) for the duration of the COVID-19 declaration under Section 564(b)(1) of the Act, 21 U.S.C. section 360bbb-3(b)(1), unless the authorization is terminated or revoked.  Performed at [facility], [address], [place],  [address]   SARS Coronavirus 2 by RT PCR (hospital order, performed in [facility] hospital lab) Nasopharyngeal Nasopharyngeal Swab     Status: None   Collection Time: 11/01/20  9:11 AM   Specimen: Nasopharyngeal Swab  Result Value Ref Range Status   SARS Coronavirus 2 NEGATIVE NEGATIVE Final    Comment: (NOTE) SARS-CoV-2 target nucleic acids are NOT DETECTED.  The SARS-CoV-2 RNA is generally detectable in upper and lower respiratory specimens during the acute phase of infection. The lowest concentration of SARS-CoV-2 viral copies this assay can detect is 250 copies / mL. A negative result does not preclude SARS-CoV-2 infection and should not be used as the sole basis for treatment or other patient management decisions.  A negative result may occur with improper specimen collection /  handling, submission of specimen other than nasopharyngeal swab, presence of viral mutation(s) within the areas targeted by this assay, and inadequate number of viral copies (<250 copies / mL). A negative result must be combined with clinical observations, patient history, and epidemiological information.  Fact Sheet for Patients:   StrictlyIdeas.no  Fact Sheet for Healthcare Providers: BankingDealers.co.za  This test is not yet approved or  cleared by the [place] FDA and has been authorized for detection and/or diagnosis of SARS-CoV-2 by FDA under an Emergency Use Authorization (EUA).  This EUA will remain in effect (meaning this test can be used) for the duration of the COVID-19 declaration under  Section 564(b)(1) of the Act, 21 U.S.C. section 360bbb-3(b)(1), unless the authorization is terminated or revoked sooner.  Performed at [facility], [address], [place], [place] [address]   MRSA PCR Screening     Status: None   Collection Time: 11/02/20  2:48 PM   Specimen: Nasopharyngeal  Result Value Ref Range Status   MRSA by PCR NEGATIVE NEGATIVE Final    Comment:        The GeneXpert MRSA Assay (FDA approved for NASAL specimens only), is one component of a comprehensive MRSA colonization surveillance program. It is not intended to diagnose MRSA infection nor to guide or monitor treatment for MRSA infections. Performed at [facility], [address] [place], [place] [address]   C Difficile Quick Screen w PCR reflex     Status: None   Collection Time: 11/02/20  3:20 PM   Specimen: STOOL  Result Value Ref Range Status   C Diff antigen NEGATIVE NEGATIVE Final   C Diff toxin NEGATIVE NEGATIVE Final   C Diff interpretation No C. difficile detected.  Final    Comment: Performed at [facility], [address], [place], [place] [address]         Radiology Studies: No results found.      Scheduled Meds: .  amiodarone  200 mg Oral Daily  . Chlorhexidine Gluconate Cloth  6 each Topical Q0600  . fludrocortisone  0.1 mg Oral Daily  . loperamide  2 mg Oral Daily  . metoprolol tartrate  12.5 mg Oral BID  . midodrine  10 mg Oral TID with meals  . pantoprazole  40 mg Oral Daily  . predniSONE  30 mg Oral Q breakfast  . simvastatin  20 mg Oral QPM  . sodium chloride flush  3 mL Intravenous Q12H   Continuous Infusions: . sodium chloride    . sodium chloride    . sodium chloride       LOS: 1 day    Time spent: 30 minutes    Pratik Darleen Crocker, DO [facility]  If 7PM-7AM, please contact night-coverage [email or web address] 11/03/2020, 2:48 PM

## 2020-11-03 NOTE — Progress Notes (Addendum)
Patient ID: [name], male   DOB: 10-24-1951, 69 y.o.   MRN: [id number] HPI:  Pt well known to our service from his most recent hospitalization from 11/12-12/2/21 and was last seen by our practice on 11/01/20.  He was discharged to SNF after a prolonged stay due to colitis and severe orthostatic hypotension.  When he was being evaluated at the SNF for orthostatic blood pressure, he passed out after standing and was sent via EMS to [facility].  Per ED records his SBP was 70 and his HR was 140, in afib with RVR.  He was readmitted for further evaluation.  We were consulted because he missed his outpatient dialysis session on 11/02/20.  Please see initial consult note dated 10/13/20. O:BP (!) 141/64   Pulse 65   Temp 98 F (36.7 C) (Oral)   Resp 17   Ht 6\' 2"  (1.88 m)   Wt 70.9 kg   SpO2 98%   BMI 20.07 kg/m   Intake/Output Summary (Last 24 hours) at 11/03/2020 1403 Last data filed at 11/03/2020 1300 Gross per 24 hour  Intake 1220 ml  Output 650 ml  Net 570 ml   Intake/Output: I/O last 3 completed shifts: In: 1250 [Other:1000; IV Piggyback:250] Out: 200 [Urine:200]  Intake/Output this shift:  Total I/O In: 220 [P.O.:220] Out: 450 [Urine:450] Weight change:  Gen: lying in bed in NAD CVS: IRR, no rub Resp: cta Abd: +BS, soft, NT/ND Ext: trace ankle edema bilaterally.  Recent Labs  Lab 10/29/20 0820 10/31/20 0849 11/01/20 0938 11/02/20 1209 11/03/20 0416  NA 130* 127* 129* 132* 131*  K 4.2 3.6 3.0* 2.9* 3.5  CL 96* 93* 95* 99 99  CO2 25 24 26 23 24   GLUCOSE 161* 176* 205* 157* 90  BUN 51* 56* 36* 46* 50*  CREATININE 3.97* 3.50* 2.51* 2.92* 3.22*  ALBUMIN 2.3*  --   --  1.9* 1.9*  CALCIUM 8.2* 7.9* 7.5* 7.4* 7.6*  PHOS 1.7*  --   --   --   --   AST  --   --   --  15 11*  ALT  --   --   --  14 12   Liver Function Tests: Recent Labs  Lab 10/29/20 0820 11/02/20 1209 11/03/20 0416  AST  --  15 11*  ALT  --  14 12  ALKPHOS  --  46 47  BILITOT  --  0.5 0.6  PROT  --  4.5*  4.4*  ALBUMIN 2.3* 1.9* 1.9*   No results for input(s): LIPASE, AMYLASE in the last 168 hours. No results for input(s): AMMONIA in the last 168 hours. CBC: Recent Labs  Lab 10/29/20 0820 10/29/20 0820 10/31/20 0849 10/31/20 0849 11/01/20 0938 11/02/20 1209 11/03/20 0416  WBC 12.1*   < > 8.8   < > 4.3 10.3 5.2  NEUTROABS  --   --   --   --   --  8.4*  --   HGB 9.5*   < > 8.5*   < > 7.5* 8.6* 8.0*  HCT 30.1*   < > 27.3*   < > 23.6* 27.5* 25.2*  MCV 91.5  --  91.9  --  91.1 92.0 92.0  PLT 309   < > 240   < > 213 214 196   < > = values in this interval not displayed.   Cardiac Enzymes: No results for input(s): CKTOTAL, CKMB, CKMBINDEX, TROPONINI in the last 168 hours. CBG: Recent Labs  Lab  10/28/20 1844  GLUCAP 267*    Iron Studies: No results for input(s): IRON, TIBC, TRANSFERRIN, FERRITIN in the last 72 hours. Studies/Results: No results found. Marland Kitchen amiodarone  200 mg Oral Daily  . Chlorhexidine Gluconate Cloth  6 each Topical Q0600  . fludrocortisone  0.1 mg Oral Daily  . metoprolol tartrate  12.5 mg Oral BID  . midodrine  10 mg Oral TID with meals  . pantoprazole  40 mg Oral Daily  . predniSONE  30 mg Oral Q breakfast  . simvastatin  20 mg Oral QPM  . sodium chloride flush  3 mL Intravenous Q12H    BMET    Component Value Date/Time   NA 131 (L) 11/03/2020 0416   K 3.5 11/03/2020 0416   CL 99 11/03/2020 0416   CO2 24 11/03/2020 0416   GLUCOSE 90 11/03/2020 0416   BUN 50 (H) 11/03/2020 0416   CREATININE 3.22 (H) 11/03/2020 0416   CALCIUM 7.6 (L) 11/03/2020 0416   GFRNONAA 20 (L) 11/03/2020 0416   GFRAA 19 (L) 06/15/2018 0407   CBC    Component Value Date/Time   WBC 5.2 11/03/2020 0416   RBC 2.74 (L) 11/03/2020 0416   HGB 8.0 (L) 11/03/2020 0416   HCT 25.2 (L) 11/03/2020 0416   PLT 196 11/03/2020 0416   MCV 92.0 11/03/2020 0416   MCH 29.2 11/03/2020 0416   MCHC 31.7 11/03/2020 0416   RDW 18.8 (H) 11/03/2020 0416   LYMPHSABS 0.5 (L) 11/02/2020 1209    MONOABS 1.2 (H) 11/02/2020 1209   EOSABS 0.0 11/02/2020 1209   BASOSABS 0.1 11/02/2020 1209     Assessment/Plan:  1. Recurrent Syncope/hypotension; sig orthostasis:On TID Midodrine. HD with minimal UF due to good UOP.  1. Recommend starting fludrocortisone 0.1 mg each morning and increase as needed. 2. Also will need abdominal binder and compression stockings  3. Would also consider droxydopa or pyridostigmine if fludrocortisone does not help (stopping florinef and midodrine). 4. Also need to control diarrhea which is likely contributing as well. 5. Keep even with HD. 2. Colitis- GI had been following and he completed a course of cipro/flagyl. He had colonoscopy on 10/17/20 which was a poor prep and incomplete exam but did note blood in the rectum and sigmoid colon, in the descending colon and at the splenic flexure; colitis involving descending and sigmoid colons seen. Biopsies performed. Per GI and surgery.  His diarrhea persists and now has a flexseal.  Currently on prednisone taper, 3. [facility]. HD off schedule today but will get back on Monday. 4. Anemia:of CKD- s/p blood transfusion. No heparin with HD. GI on board, colonoscopy findings as above. Rec 1u prbc 11/22 1. Transfuse as needed to keep Hgb >7 5. 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 Will restartcalcitriol 0.44mcg daily (was on this as an outpatient). Calcium corrects to 9.2. 6. Severe protein malnutrition:per primary, push protein 7. H/o Hypertension:see #1 8. Vascular access- will need to f/u with Dr. Donnetta Hutching once stable for discharge. 9. A fib- on amiodarone and eliquis (off A/C as of right now) 10. Hyponatremia. Monitor 11. Disposition- pending ability to safely ambulate before discharge.  Donetta Potts, MD [name] [phone]

## 2020-11-03 NOTE — Progress Notes (Signed)
Initial Nutrition Assessment  DOCUMENTATION CODES:   Not applicable  INTERVENTION:  Ensure Enlive po BID, each supplement provides 350 kcal and 20 grams of protein  ProSource Plus 30 ml po BID, each supplement provides 100 kcal and 15 grams of protein (mix with small amount of gingerale or juice for better po acceptance)  Rena-vite po daily at bedtime  NUTRITION DIAGNOSIS:   Increased nutrient needs related to chronic illness (ESRD on HD) as evidenced by estimated needs.    GOAL:   Patient will meet greater than or equal to 90% of their needs    MONITOR:   Labs, I & O's, Supplement acceptance, PO intake, Weight trends, Skin  REASON FOR ASSESSMENT:   Consult Assessment of nutrition requirement/status  ASSESSMENT:  RD working remotely.  69 year old male with history significant for DM2, CKD stage V recently started on HD, atrial fibrillation/flutter, recurrent orthostatic hypotension, HLD, who was recently hospitalized and discharged to SNF on 12/2 for colitis with associated adynamic ileus and acute blood loss anemia presented from facility after an episode of syncope while getting up to go to hemodialysis.  HD MWF RIJ catheter 12/4 HD - minimal UF d/t good UOP  Patient familiar to nutrition department and seen/followed by this RD during last admission. Patient had fair po intake of meals and did not like Nepro, but enjoyed drinking Ensure supplement and tolerated well. He was educated on the importance of adequate nutrition and increased calorie/protein needs. RD ordered ProSource Plus supplement, which patient did not like, however was willing to drink if mixed with ginger ale or juice. Per flowsheet, he consumed 100% of lunch tray this afternoon. Will order Ensure and ProSource Plus to aid with meeting needs as well as daily Renavit.   EDW: 73 kg (per Care Everywhere [facility] dialysis records on 11/5).  Patient is ~5 lbs under estimated dry weight which is concerning.  Given recent admit for colitis with ongoing persistent diarrhea as well as ESRD on HD, highly suspect degree of malnutrition, however unable to identify at this time. Will reinforce importance of nutrition and will perform exam at follow-up.   I/Os: +1050 ml since admit UOP: 200 ml x 24 hrs  Medications reviewed and include: Protonix, Prednisone  Labs: Na 131 (L), K 3.5 (WNL), BUN 50 (H), Cr 3.22 (H), Mg 2.2 (WNL), Hgb 8 (L), HCT 25.2 (L)   NUTRITION - FOCUSED PHYSICAL EXAM: Unable to complete at this time, RD working remotely.  Diet Order:   Diet Order            Diet renal with fluid restriction Fluid restriction: 1200 mL Fluid; Room service appropriate? Yes; Fluid consistency: Thin  Diet effective now                 EDUCATION NEEDS:   Not appropriate for education at this time  Skin:  Skin Integrity Issues:: Stage II Stage II: sacrum (11/13)  Last BM:  12/4-type 7  Height:   Ht Readings from Last 1 Encounters:  11/02/20 6\' 2"  (1.88 m)    Weight:   Wt Readings from Last 1 Encounters:  11/03/20 70.9 kg    BMI:  Body mass index is 20.07 kg/m.  Estimated Nutritional Needs:   Kcal:  2127-2410  Protein:  115-130  Fluid:  UOP + 1000 ml   Lajuan Lines, RD, LDN Clinical Nutrition After Hours/Weekend Pager # in [email or web address]

## 2020-11-04 LAB — CBC
HCT: 26.1 % — ABNORMAL LOW (ref 39.0–52.0)
Hemoglobin: 8 g/dL — ABNORMAL LOW (ref 13.0–17.0)
MCH: 28.8 pg (ref 26.0–34.0)
MCHC: 30.7 g/dL (ref 30.0–36.0)
MCV: 93.9 fL (ref 80.0–100.0)
Platelets: 171 10*3/uL (ref 150–400)
RBC: 2.78 MIL/uL — ABNORMAL LOW (ref 4.22–5.81)
RDW: 18.4 % — ABNORMAL HIGH (ref 11.5–15.5)
WBC: 5.8 10*3/uL (ref 4.0–10.5)
nRBC: 0 % (ref 0.0–0.2)

## 2020-11-04 LAB — BASIC METABOLIC PANEL
Anion gap: 7 (ref 5–15)
BUN: 46 mg/dL — ABNORMAL HIGH (ref 8–23)
CO2: 24 mmol/L (ref 22–32)
Calcium: 7.5 mg/dL — ABNORMAL LOW (ref 8.9–10.3)
Chloride: 99 mmol/L (ref 98–111)
Creatinine, Ser: 2.9 mg/dL — ABNORMAL HIGH (ref 0.61–1.24)
GFR, Estimated: 23 mL/min — ABNORMAL LOW (ref >60)
Glucose, Bld: 265 mg/dL — ABNORMAL HIGH (ref 70–99)
Potassium: 3.5 mmol/L (ref 3.5–5.1)
Sodium: 130 mmol/L — ABNORMAL LOW (ref 135–145)

## 2020-11-04 NOTE — Progress Notes (Signed)
PROGRESS NOTE    [name]  ZLD:[id number] DOB: [birth year] DOA: 11/02/2020 PCP: Patient, No Pcp Per   Brief Narrative:  Per HPI: [name] a 69 y.o.malewith medical history significant fortype 2 diabetes, CKD stage V recently initiated on hemodialysis MWF, atrial fibrillation/flutter on amiodarone and metoprolol and Eliquis, recurrent orthostatic hypotension, dyslipidemia, and recent discharge on 12/2 for colitis with associated adynamic ileus and acute blood loss anemia who presented back to the ED today after he was noted to have an episode of syncope while getting up to go to hemodialysis from his SNF. On arrival to the ED his systolic blood pressures were in the 70 mmHg range. He is noted to have some diarrhea, but he has recently completed course of ciprofloxacin and Flagyl by 11/23. He was noted to have nonspecific colitis on pathology after recent colonoscopy on 11/17 and was started on prednisone taper which he continues to take at this time.  Assessment & Plan:   Active Problems:   Syncope due to orthostatic hypotension   Syncope secondary to recurrent orthostatic hypotension -Continue midodrine as previous -Started on Florinef which appears to be helping -Lower extremity stockings with abdominal binder -Plan for hemodialysis per nephrology on Monday; tolerated well on 12/4 -Palliative care consultation due to recurrent episodes and inability to adequately hemodialyze. Need to discuss goals of care.  Lactic acidosis likely related to above -Likely due to soft blood pressure readings with no signs of other acute infection noted -Plan to recheck today  Ongoing colitis with diarrhea -Continue prednisone 30 mg daily for 1 week and then taper by 10 mg/week thereafter -Recent treatment with ciprofloxacin and Flagyl for 10 days completed -Repeat Cdiff negative -Imodium daily  ESRD on HD MWF -Appreciate nephrology evaluation for hemodialysis while  inpatient  Chronic anemia-stable -Received 5 units of PRBCs during his last admission -Plan to hold apixaban for now and maintain on SCDs -Recheck CBC in a.m. with no overt bleeding identified -Hemoglobin remains stable  Paroxysmal atrial fibrillation/flutter -Continue close monitoring on telemetry -Resume home amiodarone and hold metoprolol while blood pressures are soft -Asymptomatic  Dyslipidemia -Continue Zocor  Hypoalbuminemia -Likely mild to moderate protein calorie malnutrition -Dietitian consultation   DVT prophylaxis:SCDs Code Status: Full Family Communication: Pt will call; none at bedside Disposition Plan:  Status is: Inpatient  Remains inpatient appropriate because:IV treatments appropriate due to intensity of illness or inability to take PO and Inpatient level of care appropriate due to severity of illness   Dispo: The patient is from: SNF  Anticipated d/c is to: SNF  Anticipated d/c date is: 1 days  Patient currently is not medically stable to d/c.  Plan for likely discharge in a.m. after hemodialysis if blood pressures remain stable.  Consultants:   Nephrology  Procedures:   See below  Antimicrobials:   None  Subjective: Patient seen and evaluated today with no new acute complaints or concerns. No acute concerns or events noted overnight.  Blood pressures remain improved and elevated.  Objective: Vitals:   11/03/20 1600 11/03/20 1951 11/03/20 2125 11/04/20 0411  BP: (!) 151/69  (!) 159/76 (!) 162/76  Pulse: 65  64 60  Resp: 17  18 17   Temp: 98.2 F (36.8 C)  98.1 F (36.7 C) 98 F (36.7 C)  TempSrc: Oral     SpO2: 98% 94% 98% 99%  Weight: 70.2 kg   71.2 kg  Height:        Intake/Output Summary (Last 24 hours) at 11/04/2020 1036  Last data filed at 11/04/2020 0546 Gross per 24 hour  Intake 700 ml  Output 1262 ml  Net -562 ml   Filed Weights   11/03/20 1230 11/03/20 1600 11/04/20 0411   Weight: 70.9 kg 70.2 kg 71.2 kg    Examination:  General exam: Appears calm and comfortable  Respiratory system: Clear to auscultation. Respiratory effort normal. Cardiovascular system: S1 & S2 heard, RRR.  Gastrointestinal system: Abdomen with binder present. Central nervous system: Alert and oriented. No focal neurological deficits. Extremities: No edema with compression hose is present. Skin: No rashes, lesions or ulcers Psychiatry: Flat affect.    Data Reviewed: I have personally reviewed following labs and imaging studies  CBC: Recent Labs  Lab 10/31/20 0849 11/01/20 0938 11/02/20 1209 11/03/20 0416 11/04/20 0702  WBC 8.8 4.3 10.3 5.2 5.8  NEUTROABS  --   --  8.4*  --   --   HGB 8.5* 7.5* 8.6* 8.0* 8.0*  HCT 27.3* 23.6* 27.5* 25.2* 26.1*  MCV 91.9 91.1 92.0 92.0 93.9  PLT 240 213 214 196 644   Basic Metabolic Panel: Recent Labs  Lab 10/29/20 0820 10/29/20 0820 10/31/20 0849 11/01/20 0938 11/02/20 1209 11/03/20 0416 11/04/20 0702  NA 130*   < > 127* 129* 132* 131* 130*  K 4.2   < > 3.6 3.0* 2.9* 3.5 3.5  CL 96*   < > 93* 95* 99 99 99  CO2 25   < > 24 26 23 24 24   GLUCOSE 161*   < > 176* 205* 157* 90 265*  BUN 51*   < > 56* 36* 46* 50* 46*  CREATININE 3.97*   < > 3.50* 2.51* 2.92* 3.22* 2.90*  CALCIUM 8.2*   < > 7.9* 7.5* 7.4* 7.6* 7.5*  MG  --   --   --   --   --  2.2  --   PHOS 1.7*  --   --   --   --   --   --    < > = values in this interval not displayed.   GFR: Estimated Creatinine Clearance: 23.9 mL/min (A) (by C-G formula based on SCr of 2.9 mg/dL (H)). Liver Function Tests: Recent Labs  Lab 10/29/20 0820 11/02/20 1209 11/03/20 0416  AST  --  15 11*  ALT  --  14 12  ALKPHOS  --  46 47  BILITOT  --  0.5 0.6  PROT  --  4.5* 4.4*  ALBUMIN 2.3* 1.9* 1.9*   No results for input(s): LIPASE, AMYLASE in the last 168 hours. No results for input(s): AMMONIA in the last 168 hours. Coagulation Profile: No results for input(s): INR, PROTIME in  the last 168 hours. Cardiac Enzymes: No results for input(s): CKTOTAL, CKMB, CKMBINDEX, TROPONINI in the last 168 hours. BNP (last 3 results) No results for input(s): PROBNP in the last 8760 hours. HbA1C: No results for input(s): HGBA1C in the last 72 hours. CBG: Recent Labs  Lab 10/28/20 1844  GLUCAP 267*   Lipid Profile: No results for input(s): CHOL, HDL, LDLCALC, TRIG, CHOLHDL, LDLDIRECT in the last 72 hours. Thyroid Function Tests: No results for input(s): TSH, T4TOTAL, FREET4, T3FREE, THYROIDAB in the last 72 hours. Anemia Panel: No results for input(s): VITAMINB12, FOLATE, FERRITIN, TIBC, IRON, RETICCTPCT in the last 72 hours. Sepsis Labs: Recent Labs  Lab 11/02/20 1357  LATICACIDVEN 2.6*    Recent Results (from the past 240 hour(s))  Resp Panel by RT-PCR (Flu A&B, Covid) Nasopharyngeal Swab  Status: None   Collection Time: 10/29/20  3:03 PM   Specimen: Nasopharyngeal Swab; Nasopharyngeal(NP) swabs in vial transport medium  Result Value Ref Range Status   SARS Coronavirus 2 by RT PCR NEGATIVE NEGATIVE Final    Comment: (NOTE) SARS-CoV-2 target nucleic acids are NOT DETECTED.  The SARS-CoV-2 RNA is generally detectable in upper respiratory specimens during the acute phase of infection. The lowest concentration of SARS-CoV-2 viral copies this assay can detect is 138 copies/mL. A negative result does not preclude SARS-Cov-2 infection and should not be used as the sole basis for treatment or other patient management decisions. A negative result may occur with  improper specimen collection/handling, submission of specimen other than nasopharyngeal swab, presence of viral mutation(s) within the areas targeted by this assay, and inadequate number of viral copies(<138 copies/mL). A negative result must be combined with clinical observations, patient history, and epidemiological information. The expected result is Negative.  Fact Sheet for Patients:   EntrepreneurPulse.com.au  Fact Sheet for Healthcare Providers:  IncredibleEmployment.be  This test is no t yet approved or cleared by the [place] FDA and  has been authorized for detection and/or diagnosis of SARS-CoV-2 by FDA under an Emergency Use Authorization (EUA). This EUA will remain  in effect (meaning this test can be used) for the duration of the COVID-19 declaration under Section 564(b)(1) of the Act, 21 U.S.C.section 360bbb-3(b)(1), unless the authorization is terminated  or revoked sooner.       Influenza A by PCR NEGATIVE NEGATIVE Final   Influenza B by PCR NEGATIVE NEGATIVE Final    Comment: (NOTE) The Xpert Xpress SARS-CoV-2/FLU/RSV plus assay is intended as an aid in the diagnosis of influenza from Nasopharyngeal swab specimens and should not be used as a sole basis for treatment. Nasal washings and aspirates are unacceptable for Xpert Xpress SARS-CoV-2/FLU/RSV testing.  Fact Sheet for Patients: EntrepreneurPulse.com.au  Fact Sheet for Healthcare Providers: IncredibleEmployment.be  This test is not yet approved or cleared by the [place] FDA and has been authorized for detection and/or diagnosis of SARS-CoV-2 by FDA under an Emergency Use Authorization (EUA). This EUA will remain in effect (meaning this test can be used) for the duration of the COVID-19 declaration under Section 564(b)(1) of the Act, 21 U.S.C. section 360bbb-3(b)(1), unless the authorization is terminated or revoked.  Performed at [facility], [address], [place], [place] [address]   SARS Coronavirus 2 by RT PCR (hospital order, performed in [facility] hospital lab) Nasopharyngeal Nasopharyngeal Swab     Status: None   Collection Time: 11/01/20  9:11 AM   Specimen: Nasopharyngeal Swab  Result Value Ref Range Status   SARS Coronavirus 2 NEGATIVE NEGATIVE Final    Comment: (NOTE) SARS-CoV-2 target  nucleic acids are NOT DETECTED.  The SARS-CoV-2 RNA is generally detectable in upper and lower respiratory specimens during the acute phase of infection. The lowest concentration of SARS-CoV-2 viral copies this assay can detect is 250 copies / mL. A negative result does not preclude SARS-CoV-2 infection and should not be used as the sole basis for treatment or other patient management decisions.  A negative result may occur with improper specimen collection / handling, submission of specimen other than nasopharyngeal swab, presence of viral mutation(s) within the areas targeted by this assay, and inadequate number of viral copies (<250 copies / mL). A negative result must be combined with clinical observations, patient history, and epidemiological information.  Fact Sheet for Patients:   StrictlyIdeas.no  Fact Sheet for Healthcare  Providers: BankingDealers.co.za  This test is not yet approved or  cleared by the [place] and has been authorized for detection and/or diagnosis of SARS-CoV-2 by FDA under an Emergency Use Authorization (EUA).  This EUA will remain in effect (meaning this test can be used) for the duration of the COVID-19 declaration under Section 564(b)(1) of the Act, 21 U.S.C. section 360bbb-3(b)(1), unless the authorization is terminated or revoked sooner.  Performed at [facility], [address], [place], [place] [address]   MRSA PCR Screening     Status: None   Collection Time: 11/02/20  2:48 PM   Specimen: Nasopharyngeal  Result Value Ref Range Status   MRSA by PCR NEGATIVE NEGATIVE Final    Comment:        The GeneXpert MRSA Assay (FDA approved for NASAL specimens only), is one component of a comprehensive MRSA colonization surveillance program. It is not intended to diagnose MRSA infection nor to guide or monitor treatment for MRSA infections. Performed at [facility], [address]  [place], [place] [address]   C Difficile Quick Screen w PCR reflex     Status: None   Collection Time: 11/02/20  3:20 PM   Specimen: STOOL  Result Value Ref Range Status   C Diff antigen NEGATIVE NEGATIVE Final   C Diff toxin NEGATIVE NEGATIVE Final   C Diff interpretation No C. difficile detected.  Final    Comment: Performed at [facility], [address] [place], [place] [address]         Radiology Studies: No results found.      Scheduled Meds: . (feeding supplement) PROSource Plus  30 mL Oral BID BM  . amiodarone  200 mg Oral Daily  . Chlorhexidine Gluconate Cloth  6 each Topical Q0600  . feeding supplement  237 mL Oral BID BM  . fludrocortisone  0.1 mg Oral Daily  . loperamide  2 mg Oral Daily  . metoprolol tartrate  12.5 mg Oral BID  . midodrine  10 mg Oral TID with meals  . multivitamin  1 tablet Oral QHS  . pantoprazole  40 mg Oral Daily  . predniSONE  30 mg Oral Q breakfast  . simvastatin  20 mg Oral QPM  . sodium chloride flush  3 mL Intravenous Q12H   Continuous Infusions: . sodium chloride    . sodium chloride    . sodium chloride       LOS: 2 days    Time spent: 30 minutes    Pratik Darleen Crocker, DO [facility]  If 7PM-7AM, please contact night-coverage [email or web address] 11/04/2020, 10:36 AM

## 2020-11-05 DIAGNOSIS — R197 Diarrhea, unspecified: Secondary | ICD-10-CM

## 2020-11-05 LAB — CBC
HCT: 25.4 % — ABNORMAL LOW (ref 39.0–52.0)
Hemoglobin: 8.1 g/dL — ABNORMAL LOW (ref 13.0–17.0)
MCH: 29.6 pg (ref 26.0–34.0)
MCHC: 31.9 g/dL (ref 30.0–36.0)
MCV: 92.7 fL (ref 80.0–100.0)
Platelets: 182 10*3/uL (ref 150–400)
RBC: 2.74 MIL/uL — ABNORMAL LOW (ref 4.22–5.81)
RDW: 18.7 % — ABNORMAL HIGH (ref 11.5–15.5)
WBC: 9.8 10*3/uL (ref 4.0–10.5)
nRBC: 0 % (ref 0.0–0.2)

## 2020-11-05 LAB — BASIC METABOLIC PANEL
Anion gap: 8 (ref 5–15)
BUN: 55 mg/dL — ABNORMAL HIGH (ref 8–23)
CO2: 23 mmol/L (ref 22–32)
Calcium: 7.3 mg/dL — ABNORMAL LOW (ref 8.9–10.3)
Chloride: 97 mmol/L — ABNORMAL LOW (ref 98–111)
Creatinine, Ser: 3.5 mg/dL — ABNORMAL HIGH (ref 0.61–1.24)
GFR, Estimated: 18 mL/min — ABNORMAL LOW (ref >60)
Glucose, Bld: 235 mg/dL — ABNORMAL HIGH (ref 70–99)
Potassium: 3.1 mmol/L — ABNORMAL LOW (ref 3.5–5.1)
Sodium: 128 mmol/L — ABNORMAL LOW (ref 135–145)

## 2020-11-05 LAB — MAGNESIUM: Magnesium: 1.7 mg/dL (ref 1.7–2.4)

## 2020-11-05 LAB — RESP PANEL BY RT-PCR (FLU A&B, COVID) ARPGX2
Influenza A by PCR: NEGATIVE
Influenza B by PCR: NEGATIVE
SARS Coronavirus 2 by RT PCR: NEGATIVE

## 2020-11-05 MED ORDER — LOPERAMIDE HCL 2 MG PO CAPS
2.0000 mg | ORAL_CAPSULE | Freq: Three times a day (TID) | ORAL | Status: AC
  Administered 2020-11-05 – 2020-11-19 (×44): 2 mg via ORAL
  Filled 2020-11-05 (×44): qty 1

## 2020-11-05 MED ORDER — PANCRELIPASE (LIP-PROT-AMYL) 12000-38000 UNITS PO CPEP
72000.0000 [IU] | ORAL_CAPSULE | Freq: Three times a day (TID) | ORAL | Status: DC
  Administered 2020-11-06 – 2020-11-13 (×22): 72000 [IU] via ORAL
  Filled 2020-11-05 (×23): qty 6

## 2020-11-05 MED ORDER — PANCRELIPASE (LIP-PROT-AMYL) 12000-38000 UNITS PO CPEP
12000.0000 [IU] | ORAL_CAPSULE | Freq: Three times a day (TID) | ORAL | Status: DC
  Administered 2020-11-05: 12000 [IU] via ORAL
  Filled 2020-11-05: qty 1

## 2020-11-05 NOTE — Progress Notes (Addendum)
Subjective: No abdominal pain, N/V. No GI bleeding. Notes prior to presentation to [facility] initially, he had no diarrhea. Acute onset of N/V/D, presenting to [facility], and then requiring dialysis thereafter. Discharged 12/2 after several week hospitalization here, whereby colonoscopy was completed. Returned 12/3, one day after discharge, after episode of syncope while getting up to go to dialysis from SNF. 450 ml emptied from rectal tube since 10 am. Cdiff negative this admission.   Objective: Vital signs in last 24 hours: Temp:  [98.2 F (36.8 C)] 98.2 F (36.8 C) (12/06 1310) Pulse Rate:  [55-70] 58 (12/06 1310) Resp:  [17-18] 18 (12/06 1310) BP: (150-172)/(77-92) 154/77 (12/06 1320) SpO2:  [98 %] 98 % (12/06 1310) Weight:  [71 kg] 71 kg (12/06 1310) Last BM Date: 11/04/20 General:   Alert and oriented, pleasant, pale Head:  Normocephalic and atraumatic. Eyes:  No icterus, sclera clear. Conjuctiva pink.  Abdomen:  Bowel sounds present, soft, non-tender, non-distended. ABD binder in place Msk:  Symmetrical without gross deformities. Normal posture. Neurologic:  Alert and  oriented x4 Psych:  Alert and cooperative. Normal mood and affect.  Intake/Output from previous day: 12/05 0701 - 12/06 0700 In: 480 [P.O.:480] Out: 1100 [Urine:1100] Intake/Output this shift: Total I/O In: 3 [I.V.:3] Out: 400 [Urine:400]  Lab Results: Recent Labs    11/03/20 0416 11/04/20 0702 11/05/20 0500  WBC 5.2 5.8 9.8  HGB 8.0* 8.0* 8.1*  HCT 25.2* 26.1* 25.4*  PLT 196 171 182   BMET Recent Labs    11/03/20 0416 11/04/20 0702 11/05/20 0500  NA 131* 130* 128*  K 3.5 3.5 3.1*  CL 99 99 97*  CO2 24 24 23   GLUCOSE 90 265* 235*  BUN 50* 46* 55*  CREATININE 3.22* 2.90* 3.50*  CALCIUM 7.6* 7.5* 7.3*   LFT Recent Labs    11/03/20 0416  PROT 4.4*  ALBUMIN 1.9*  AST 11*  ALT 12  ALKPHOS 47  BILITOT 0.6   Lab Results  Component Value Date   TSH 1.912 10/12/2020     Assessment: 69 year old male well known to [place] from recent several week admission for diarrhea and rectal bleeding, found to have CT findings (without contrast) of colitis, developing rectal bleeding during hospitalization in setting of Eliquis, receiving multiple units of PRBCs, undergoing colonoscopy recent admission with poor prep and incomplete exam. Blood noted in rectum, sigmoid, descending colon, and at splenic flexure. Colonoscopy findings suspicious for segmental or ischemic colitis. Pathology without changes or features of IBD, query drug-induced or ischemic. CMV stains negative. However, he was started empirically on steroids with mild improvement in diarrhea. Prior hospitalization received empiric course of Cipro and Flagyl. Prior hospitalization negative Cdiff and GI pathogen panel.   Now with persistent watery stool, Cdiff negative, flex-seal in place. Continues with issues with recurrent orthostatic hypotension, on midodrine, Florinef, supportive measures including lower extremity compression and abdominal binder. Keeping up with GI losses has been a challenge. GI now requested to see patient again due to persistent diarrhea.  Will order GI pathogen panel again, O&P (unsure if this is included in GI pathogen panel or not but ordering separately to be thorough), celiac serologies, stool osmolality. Discussed earlier with Dr. Manuella Ghazi and will continue prednisone 30 mg daily and taper as described. Increase Imodium to TID. Add Creon for supportive measures. Symptoms seem to have started after acute gastroenteritis and persisted with extreme high diarrheal output; would expect if post-infectious IBS presentation would not have such severe ongoing diarrhea. Suspect  other occult etiologies. I have not ordered carcinoid work-up as of yet. Will follow-up on current work-up first. He still needs repeat colonoscopy when appropriate, which had been planned as outpatient, as this was poor prep and  incomplete exam. May need to revisit this while inpatient.   ESRD on dialysis: Nephrology following.    Plan: GI pathogen panel with O&P Stool osmolality Celiac serologies Imodium TID Creon 72,000 units with meals and 1 with snacks Prednisone taper as previously outlined Ultimately needs complete colonoscopy as prior was poor prep GI to evaluate again tomorrow    Annitta Needs, PhD, ANP-BC [facility]    LOS: 3 days    11/05/2020, 3:45 PM

## 2020-11-05 NOTE — TOC Progression Note (Signed)
Transition of Care [facility]) - Progression Note    Patient Details  Name: [name] MRN: [id number] Date of Birth: 1951/06/17  Transition of Care [facility]) CM/SW Contact  Boneta Lucks, RN Phone Number: 11/05/2020, 4:24 PM  Clinical Narrative:   Donella Stade updated Mardene Celeste at [facility]. Patient had dialysis today, still has a lot of diarrhea, rectal tube in place. [facility] will accept back to facility with rectal tube. [facility] is concerned with low blood pressure when standing. Patient had dialysis today. MD to keep and continue to assess patient. When stable he will go back to [facility].    Expected Discharge Plan: Skilled Nursing Facility Barriers to Discharge: Continued Medical Work up  Expected Discharge Plan and Services Expected Discharge Plan: [place]

## 2020-11-05 NOTE — Progress Notes (Signed)
   11/05/20 1503  Orthostatic Lying   BP- Lying 155/79  Pulse- Lying 67  Orthostatic Sitting  BP- Sitting 139/85  Pulse- Sitting 76  Orthostatic Standing at 0 minutes  BP- Standing at 0 minutes 113/75  Pulse- Standing at 0 minutes 93

## 2020-11-05 NOTE — Progress Notes (Signed)
Patient ID: [name], male   DOB: 06/01/1951, 69 y.o.   MRN: [id number]   S:   Readmitted over weekend after syncope/orthostasis at SNF HD 12/4, no UF, tol well.    No c/o this AM  Started on florinef  Still good UOP, sig stool output  O:BP (!) 165/85 (BP Location: Right Arm)   Pulse 70   Temp 98.2 F (36.8 C) (Oral)   Resp 17   Ht 6\' 2"  (1.88 m)   Wt 71.2 kg   SpO2 98%   BMI 20.15 kg/m   Intake/Output Summary (Last 24 hours) at 11/05/2020 8657 Last data filed at 11/05/2020 0900 Gross per 24 hour  Intake 483 ml  Output 1500 ml  Net -1017 ml   Intake/Output:   I/O last 3 completed shifts: In: 960 [P.O.:960] Out: 1850 [Urine:1850]  Intake/Output this shift:  Total I/O In: 3 [I.V.:3] Out: 400 [Urine:400] Weight change:    NAD RIJ TDC No sig LEE S/nt Regular CTAB Nonfocl EOMI NCAT   Recent Labs  Lab 10/31/20 0849 11/01/20 0938 11/02/20 1209 11/03/20 0416 11/04/20 0702  NA 127* 129* 132* 131* 130*  K 3.6 3.0* 2.9* 3.5 3.5  CL 93* 95* 99 99 99  CO2 24 26 23 24 24   GLUCOSE 176* 205* 157* 90 265*  BUN 56* 36* 46* 50* 46*  CREATININE 3.50* 2.51* 2.92* 3.22* 2.90*  ALBUMIN  --   --  1.9* 1.9*  --   CALCIUM 7.9* 7.5* 7.4* 7.6* 7.5*  AST  --   --  15 11*  --   ALT  --   --  14 12  --    Liver Function Tests: Recent Labs  Lab 11/02/20 1209 11/03/20 0416  AST 15 11*  ALT 14 12  ALKPHOS 46 47  BILITOT 0.5 0.6  PROT 4.5* 4.4*  ALBUMIN 1.9* 1.9*   No results for input(s): LIPASE, AMYLASE in the last 168 hours. No results for input(s): AMMONIA in the last 168 hours. CBC: Recent Labs  Lab 10/31/20 0849 10/31/20 0849 11/01/20 0938 11/01/20 0938 11/02/20 1209 11/03/20 0416 11/04/20 0702  WBC 8.8   < > 4.3   < > 10.3 5.2 5.8  NEUTROABS  --   --   --   --  8.4*  --   --   HGB 8.5*   < > 7.5*   < > 8.6* 8.0* 8.0*  HCT 27.3*   < > 23.6*   < > 27.5* 25.2* 26.1*  MCV 91.9  --  91.1  --  92.0 92.0 93.9  PLT 240   < > 213   < > 214 196 171   < >  = values in this interval not displayed.   Cardiac Enzymes: No results for input(s): CKTOTAL, CKMB, CKMBINDEX, TROPONINI in the last 168 hours. CBG: No results for input(s): GLUCAP in the last 168 hours.  Iron Studies: No results for input(s): IRON, TIBC, TRANSFERRIN, FERRITIN in the last 72 hours. Studies/Results: No results found. . (feeding supplement) PROSource Plus  30 mL Oral BID BM  . amiodarone  200 mg Oral Daily  . Chlorhexidine Gluconate Cloth  6 each Topical Q0600  . feeding supplement  237 mL Oral BID BM  . fludrocortisone  0.1 mg Oral Daily  . loperamide  2 mg Oral Daily  . metoprolol tartrate  12.5 mg Oral BID  . midodrine  10 mg Oral TID with meals  . multivitamin  1 tablet Oral  QHS  . pantoprazole  40 mg Oral Daily  . predniSONE  30 mg Oral Q breakfast  . simvastatin  20 mg Oral QPM  . sodium chloride flush  3 mL Intravenous Q12H    Assessment/Plan:  1. Recurrent Syncope/hypotension; sig orthostasis:On TID Midodrine + Florinef + compression. HD with minimal UF, prob exacerbated by #2 and #9 1. Keep even with HD--> sig UOP of > 1 L daily. 2. Colitis- GI following s/p ABX. on pred taper. Still with diarrhea 3. ESRDrecent start on MWF at [facility]. HD today on schedule: no UF, no heparin, 3h, 3K 4. Anemia:of CKD- No heparin with HD. [facility]A with HD qWed while inpatient 5. 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 Calcium corrects; Phos low no binders encourage PO 6. PCM / Nutrition:per primary, push protein 7. H/o Hypertension:see #1 8. Vascular access- will need to f/u with Dr. Donnetta Hutching once stable for discharge. 9. A fib- on amio and MTP, AC per cardiology off apixaban 10. Hyponatremia. Monitor, mild, ASx stable 11. Disposition- SNF, no inpatient renal needs ok for discharge   Rexene Agent  MD [facility]

## 2020-11-05 NOTE — Progress Notes (Signed)
PROGRESS NOTE    [name]  MWN:[id number] DOB: 05-05-1951 DOA: 11/02/2020 PCP: Patient, No Pcp Per   Brief Narrative:  Per HPI: [name] a 69 y.o.malewith medical history significant fortype 2 diabetes, CKD stage V recently initiated on hemodialysis MWF, atrial fibrillation/flutter on amiodarone and metoprolol and Eliquis, recurrent orthostatic hypotension, dyslipidemia, and recent discharge on 12/2 for colitis with associated adynamic ileus and acute blood loss anemia who presented back to the ED today after he was noted to have an episode of syncope while getting up to go to hemodialysis from his SNF. On arrival to the ED his systolic blood pressures were in the 70 mmHg range. He is noted to have some diarrhea, but he has recently completed course of ciprofloxacin and Flagyl by 11/23. He was noted to have nonspecific colitis on pathology after recent colonoscopy on 11/17 and was started on prednisone taper which he continues to take at this time.  Assessment & Plan:   Active Problems:   Syncope due to orthostatic hypotension   Syncope secondary to recurrent orthostatic hypotension -Continue midodrine as previous -Started on Florinefwhich appears to be helping -Lower extremity stockingswith abdominal binder -Plan for hemodialysis per nephrologyon Monday; tolerated well on 12/6 -Continues to remain orthostatic with high stool output -May need palliative care evaluation eventually if he fails to continue improvement  Lactic acidosis likely related to above -Likely due to soft blood pressure readings with no signs of other acute infection noted -Plan to recheck in a.m.  Ongoing colitis with diarrhea -Continue prednisone 30 mg daily for 1 week and then taper by 10 mg/week thereafter -Recent treatment with ciprofloxacin and Flagyl for 10 days completed -Repeat Cdiff negative -Imodium daily increased to 3 times daily with Creon added after discussion with GI -Formal  consultation with GI appreciated for further evaluation  ESRD on HD MWF -Appreciate nephrology evaluation for hemodialysis while inpatient, had hemodialysis 12/6  Chronic anemia-stable -Received 5 units of PRBCs during his last admission -Plan to hold apixaban for now and maintain on SCDs -Recheck CBC in a.m. with no overt bleeding identified -Hemoglobin remains stable  Paroxysmal atrial fibrillation/flutter -Continue close monitoring on telemetry -Resume home amiodarone and hold metoprolol while blood pressures are soft -Asymptomatic  Dyslipidemia -Continue Zocor  Hypoalbuminemia -Likely mild to moderate protein calorie malnutrition -Dietitian consultation   DVT prophylaxis:SCDs Code Status:Full Family Communication:Pt will call; none at bedside Disposition Plan: Status is: Inpatient  Remains inpatient appropriate because:IV treatments appropriate due to intensity of illness or inability to take PO and Inpatient level of care appropriate due to severity of illness   Dispo: The patient is from:SNF Anticipated d/c is to:SNF Anticipated d/c date is: 2-3 days Patient currently is not medically stable to d/c.    Will plan to discharge once stool output has diminished and patient less orthostatic.  May need palliative consultation prior to discharge, however as it is uncertain how well he will do in the long-term.  Consultants:  Nephrology  GI  Procedures:  See below  Antimicrobials:   None  Subjective: Patient seen and evaluated today with ongoing high amounts of stool output.  He continues to remain orthostatic.  Objective: Vitals:   11/05/20 1230 11/05/20 1300 11/05/20 1310 11/05/20 1320  BP: (!) 152/78 (!) 157/77 (!) 154/85 (!) 154/77  Pulse: 60 (!) 58 (!) 58   Resp: 18 18 18    Temp:   98.2 F (36.8 C)   TempSrc:   Oral   SpO2:  98%   Weight:   71 kg   Height:        Intake/Output Summary  (Last 24 hours) at 11/05/2020 1537 Last data filed at 11/05/2020 1310 Gross per 24 hour  Intake 243 ml  Output 1500 ml  Net -1257 ml   Filed Weights   11/04/20 0411 11/05/20 1000 11/05/20 1310  Weight: 71.2 kg 71 kg 71 kg    Examination:  General exam: Appears calm and comfortable  Respiratory system: Clear to auscultation. Respiratory effort normal. Cardiovascular system: S1 & S2 heard, RRR.  Gastrointestinal system: Abdomen is nondistended, soft and nontender. Central nervous system: Alert and oriented. No focal neurological deficits. Extremities: No edema Skin: No rashes, lesions or ulcers Psychiatry: Flat affect Flexi-Seal with brown liquidy stool output    Data Reviewed: I have personally reviewed following labs and imaging studies  CBC: Recent Labs  Lab 11/01/20 0938 11/02/20 1209 11/03/20 0416 11/04/20 0702 11/05/20 0500  WBC 4.3 10.3 5.2 5.8 9.8  NEUTROABS  --  8.4*  --   --   --   HGB 7.5* 8.6* 8.0* 8.0* 8.1*  HCT 23.6* 27.5* 25.2* 26.1* 25.4*  MCV 91.1 92.0 92.0 93.9 92.7  PLT 213 214 196 171 253   Basic Metabolic Panel: Recent Labs  Lab 11/01/20 0938 11/02/20 1209 11/03/20 0416 11/04/20 0702 11/05/20 0500  NA 129* 132* 131* 130* 128*  K 3.0* 2.9* 3.5 3.5 3.1*  CL 95* 99 99 99 97*  CO2 26 23 24 24 23   GLUCOSE 205* 157* 90 265* 235*  BUN 36* 46* 50* 46* 55*  CREATININE 2.51* 2.92* 3.22* 2.90* 3.50*  CALCIUM 7.5* 7.4* 7.6* 7.5* 7.3*  MG  --   --  2.2  --  1.7   GFR: Estimated Creatinine Clearance: 19.7 mL/min (A) (by C-G formula based on SCr of 3.5 mg/dL (H)). Liver Function Tests: Recent Labs  Lab 11/02/20 1209 11/03/20 0416  AST 15 11*  ALT 14 12  ALKPHOS 46 47  BILITOT 0.5 0.6  PROT 4.5* 4.4*  ALBUMIN 1.9* 1.9*   No results for input(s): LIPASE, AMYLASE in the last 168 hours. No results for input(s): AMMONIA in the last 168 hours. Coagulation Profile: No results for input(s): INR, PROTIME in the last 168 hours. Cardiac  Enzymes: No results for input(s): CKTOTAL, CKMB, CKMBINDEX, TROPONINI in the last 168 hours. BNP (last 3 results) No results for input(s): PROBNP in the last 8760 hours. HbA1C: No results for input(s): HGBA1C in the last 72 hours. CBG: No results for input(s): GLUCAP in the last 168 hours. Lipid Profile: No results for input(s): CHOL, HDL, LDLCALC, TRIG, CHOLHDL, LDLDIRECT in the last 72 hours. Thyroid Function Tests: No results for input(s): TSH, T4TOTAL, FREET4, T3FREE, THYROIDAB in the last 72 hours. Anemia Panel: No results for input(s): VITAMINB12, FOLATE, FERRITIN, TIBC, IRON, RETICCTPCT in the last 72 hours. Sepsis Labs: Recent Labs  Lab 11/02/20 1357  LATICACIDVEN 2.6*    Recent Results (from the past 240 hour(s))  Resp Panel by RT-PCR (Flu A&B, Covid) Nasopharyngeal Swab     Status: None   Collection Time: 10/29/20  3:03 PM   Specimen: Nasopharyngeal Swab; Nasopharyngeal(NP) swabs in vial transport medium  Result Value Ref Range Status   SARS Coronavirus 2 by RT PCR NEGATIVE NEGATIVE Final    Comment: (NOTE) SARS-CoV-2 target nucleic acids are NOT DETECTED.  The SARS-CoV-2 RNA is generally detectable in upper respiratory specimens during the acute phase of infection. The lowest concentration of  SARS-CoV-2 viral copies this assay can detect is 138 copies/mL. A negative result does not preclude SARS-Cov-2 infection and should not be used as the sole basis for treatment or other patient management decisions. A negative result may occur with  improper specimen collection/handling, submission of specimen other than nasopharyngeal swab, presence of viral mutation(s) within the areas targeted by this assay, and inadequate number of viral copies(<138 copies/mL). A negative result must be combined with clinical observations, patient history, and epidemiological information. The expected result is Negative.  Fact Sheet for Patients:   EntrepreneurPulse.com.au  Fact Sheet for Healthcare Providers:  IncredibleEmployment.be  This test is no t yet approved or cleared by the [place] FDA and  has been authorized for detection and/or diagnosis of SARS-CoV-2 by FDA under an Emergency Use Authorization (EUA). This EUA will remain  in effect (meaning this test can be used) for the duration of the COVID-19 declaration under Section 564(b)(1) of the Act, 21 U.S.C.section 360bbb-3(b)(1), unless the authorization is terminated  or revoked sooner.       Influenza A by PCR NEGATIVE NEGATIVE Final   Influenza B by PCR NEGATIVE NEGATIVE Final    Comment: (NOTE) The Xpert Xpress SARS-CoV-2/FLU/RSV plus assay is intended as an aid in the diagnosis of influenza from Nasopharyngeal swab specimens and should not be used as a sole basis for treatment. Nasal washings and aspirates are unacceptable for Xpert Xpress SARS-CoV-2/FLU/RSV testing.  Fact Sheet for Patients: EntrepreneurPulse.com.au  Fact Sheet for Healthcare Providers: IncredibleEmployment.be  This test is not yet approved or cleared by the [place] FDA and has been authorized for detection and/or diagnosis of SARS-CoV-2 by FDA under an Emergency Use Authorization (EUA). This EUA will remain in effect (meaning this test can be used) for the duration of the COVID-19 declaration under Section 564(b)(1) of the Act, 21 U.S.C. section 360bbb-3(b)(1), unless the authorization is terminated or revoked.  Performed at [facility], [address], [place], [place] [address]   SARS Coronavirus 2 by RT PCR (hospital order, performed in [facility] hospital lab) Nasopharyngeal Nasopharyngeal Swab     Status: None   Collection Time: 11/01/20  9:11 AM   Specimen: Nasopharyngeal Swab  Result Value Ref Range Status   SARS Coronavirus 2 NEGATIVE NEGATIVE Final    Comment: (NOTE) SARS-CoV-2 target  nucleic acids are NOT DETECTED.  The SARS-CoV-2 RNA is generally detectable in upper and lower respiratory specimens during the acute phase of infection. The lowest concentration of SARS-CoV-2 viral copies this assay can detect is 250 copies / mL. A negative result does not preclude SARS-CoV-2 infection and should not be used as the sole basis for treatment or other patient management decisions.  A negative result may occur with improper specimen collection / handling, submission of specimen other than nasopharyngeal swab, presence of viral mutation(s) within the areas targeted by this assay, and inadequate number of viral copies (<250 copies / mL). A negative result must be combined with clinical observations, patient history, and epidemiological information.  Fact Sheet for Patients:   StrictlyIdeas.no  Fact Sheet for Healthcare Providers: BankingDealers.co.za  This test is not yet approved or  cleared by the [place] FDA and has been authorized for detection and/or diagnosis of SARS-CoV-2 by FDA under an Emergency Use Authorization (EUA).  This EUA will remain in effect (meaning this test can be used) for the duration of the COVID-19 declaration under Section 564(b)(1) of the Act, 21 U.S.C. section 360bbb-3(b)(1), unless the authorization is terminated or  revoked sooner.  Performed at [facility], [address] [place], [place] [address]   MRSA PCR Screening     Status: None   Collection Time: 11/02/20  2:48 PM   Specimen: Nasopharyngeal  Result Value Ref Range Status   MRSA by PCR NEGATIVE NEGATIVE Final    Comment:        The GeneXpert MRSA Assay (FDA approved for NASAL specimens only), is one component of a comprehensive MRSA colonization surveillance program. It is not intended to diagnose MRSA infection nor to guide or monitor treatment for MRSA infections. Performed at [facility], [address],  [place], [place] [address]   C Difficile Quick Screen w PCR reflex     Status: None   Collection Time: 11/02/20  3:20 PM   Specimen: STOOL  Result Value Ref Range Status   C Diff antigen NEGATIVE NEGATIVE Final   C Diff toxin NEGATIVE NEGATIVE Final   C Diff interpretation No C. difficile detected.  Final    Comment: Performed at [facility], [address], [place], [place] [address]  Resp Panel by RT-PCR (Flu A&B, Covid) Nasopharyngeal Swab     Status: None   Collection Time: 11/05/20 10:41 AM   Specimen: Nasopharyngeal Swab; Nasopharyngeal(NP) swabs in vial transport medium  Result Value Ref Range Status   SARS Coronavirus 2 by RT PCR NEGATIVE NEGATIVE Final    Comment: (NOTE) SARS-CoV-2 target nucleic acids are NOT DETECTED.  The SARS-CoV-2 RNA is generally detectable in upper respiratory specimens during the acute phase of infection. The lowest concentration of SARS-CoV-2 viral copies this assay can detect is 138 copies/mL. A negative result does not preclude SARS-Cov-2 infection and should not be used as the sole basis for treatment or other patient management decisions. A negative result may occur with  improper specimen collection/handling, submission of specimen other than nasopharyngeal swab, presence of viral mutation(s) within the areas targeted by this assay, and inadequate number of viral copies(<138 copies/mL). A negative result must be combined with clinical observations, patient history, and epidemiological information. The expected result is Negative.  Fact Sheet for Patients:  EntrepreneurPulse.com.au  Fact Sheet for Healthcare Providers:  IncredibleEmployment.be  This test is no t yet approved or cleared by the [place] FDA and  has been authorized for detection and/or diagnosis of SARS-CoV-2 by FDA under an Emergency Use Authorization (EUA). This EUA will remain  in effect (meaning this test can be used) for the duration  of the COVID-19 declaration under Section 564(b)(1) of the Act, 21 U.S.C.section 360bbb-3(b)(1), unless the authorization is terminated  or revoked sooner.       Influenza A by PCR NEGATIVE NEGATIVE Final   Influenza B by PCR NEGATIVE NEGATIVE Final    Comment: (NOTE) The Xpert Xpress SARS-CoV-2/FLU/RSV plus assay is intended as an aid in the diagnosis of influenza from Nasopharyngeal swab specimens and should not be used as a sole basis for treatment. Nasal washings and aspirates are unacceptable for Xpert Xpress SARS-CoV-2/FLU/RSV testing.  Fact Sheet for Patients: EntrepreneurPulse.com.au  Fact Sheet for Healthcare Providers: IncredibleEmployment.be  This test is not yet approved or cleared by the [place] FDA and has been authorized for detection and/or diagnosis of SARS-CoV-2 by FDA under an Emergency Use Authorization (EUA). This EUA will remain in effect (meaning this test can be used) for the duration of the COVID-19 declaration under Section 564(b)(1) of the Act, 21 U.S.C. section 360bbb-3(b)(1), unless the authorization is terminated or revoked.  Performed at [facility]  [facility], [address] [place], [place] [address]          Radiology Studies: No results found.      Scheduled Meds: . (feeding supplement) PROSource Plus  30 mL Oral BID BM  . amiodarone  200 mg Oral Daily  . Chlorhexidine Gluconate Cloth  6 each Topical Q0600  . feeding supplement  237 mL Oral BID BM  . fludrocortisone  0.1 mg Oral Daily  . lipase/protease/amylase  12,000 Units Oral TID WC  . loperamide  2 mg Oral TID  . metoprolol tartrate  12.5 mg Oral BID  . midodrine  10 mg Oral TID with meals  . multivitamin  1 tablet Oral QHS  . pantoprazole  40 mg Oral Daily  . predniSONE  30 mg Oral Q breakfast  . simvastatin  20 mg Oral QPM  . sodium chloride flush  3 mL Intravenous Q12H   Continuous Infusions: . sodium chloride    . sodium chloride     . sodium chloride       LOS: 3 days    Time spent: 35 minutes    Mende Biswell Darleen Crocker, DO [facility]  If 7PM-7AM, please contact night-coverage [email or web address] 11/05/2020, 3:37 PM

## 2020-11-06 ENCOUNTER — Inpatient Hospital Stay (HOSPITAL_COMMUNITY): Payer: Medicare Other

## 2020-11-06 MED ORDER — PANCRELIPASE (LIP-PROT-AMYL) 12000-38000 UNITS PO CPEP
36000.0000 [IU] | ORAL_CAPSULE | ORAL | Status: DC
  Administered 2020-11-07 – 2020-11-11 (×9): 36000 [IU] via ORAL
  Filled 2020-11-06 (×7): qty 3

## 2020-11-06 MED ORDER — DARBEPOETIN ALFA 100 MCG/0.5ML IJ SOSY
100.0000 ug | PREFILLED_SYRINGE | INTRAMUSCULAR | Status: DC
  Administered 2020-11-07 – 2020-11-15 (×2): 100 ug via INTRAVENOUS
  Filled 2020-11-06 (×4): qty 0.5

## 2020-11-06 MED ORDER — CHLORHEXIDINE GLUCONATE CLOTH 2 % EX PADS
6.0000 | MEDICATED_PAD | Freq: Every day | CUTANEOUS | Status: DC
  Administered 2020-11-11 – 2020-12-10 (×25): 6 via TOPICAL

## 2020-11-06 NOTE — Progress Notes (Signed)
Subjective: Feeling ok this morning. No lightheadedness or dizziness. States his BP continue to drop somewhat when he stands. No abdominal pain, nausea, or vomiting. Rectal tube is in place. 223mL thus far today at 11am.   Objective: Vital signs in last 24 hours: Temp:  [98 F (36.7 C)-98.3 F (36.8 C)] 98.1 F (36.7 C) (12/07 0800) Pulse Rate:  [58-67] 67 (12/07 0800) Resp:  [16-18] 16 (12/07 0800) BP: (150-169)/(71-88) 152/75 (12/07 0800) SpO2:  [98 %] 98 % (12/07 0800) Weight:  [71 kg-71.1 kg] 71.1 kg (12/07 0500) Last BM Date:  (rectal tube) General:   Alert and oriented, pleasant Head:  Normocephalic and atraumatic. Eyes:  No icterus, sclera clear. Conjuctiva pink.  Abdomen:  Bowel sounds present, soft, non-tender, non-distended. No HSM or hernias noted. No rebound or guarding. No masses appreciated  Extremities:  Without edema. Neurologic:  Alert and  oriented x4;  grossly normal neurologically. Skin:  Warm and dry, intact without significant lesions.  Psych: Normal mood and affect.  Intake/Output from previous day: 12/06 0701 - 12/07 0700 In: 723 [P.O.:720; I.V.:3] Out: 1425 [Urine:875; Stool:550] Intake/Output this shift: No intake/output data recorded.  Lab Results: Recent Labs    11/04/20 0702 11/05/20 0500  WBC 5.8 9.8  HGB 8.0* 8.1*  HCT 26.1* 25.4*  PLT 171 182   BMET Recent Labs    11/04/20 0702 11/05/20 0500  NA 130* 128*  K 3.5 3.1*  CL 99 97*  CO2 24 23  GLUCOSE 265* 235*  BUN 46* 55*  CREATININE 2.90* 3.50*  CALCIUM 7.5* 7.3*   Assessment: 69 year old male well known to [place] from recent several week admission for diarrhea and rectal bleeding, found to have CT findings (without contrast) of colitis, developing rectal bleeding during hospitalization in setting of Eliquis, receiving multiple units of PRBCs, undergoing colonoscopy during recent admission with poor prep and incomplete exam. Blood noted in rectum, sigmoid, descending colon, and  at splenic flexure. Colonoscopy findings suspicious for segmental or ischemic colitis. Pathology without changes or features of IBD, query drug-induced or ischemic. CMV stains negative. He was started empirically on steroids with mild improvement in diarrhea. Prior hospitalization received empiric course of Cipro and Flagyl. Prior hospitalization negative Cdiff and GI pathogen panel.  Patient presented back to the hospital 12/3 due to an episode of syncope while getting up to go to dialysis from SNF.  He continues with recurrent orthostatic hypotension, on midodrine, Florinef, supportive measures with lower extremity compression and abdominal binder.  Keeping up with GI losses has been a challenge.  Due to ongoing watery diarrhea, GI was consulted again.  Etiology of ongoing watery diarrhea is not clear.  Initially, symptoms seem to have started after an acute gastroenteritis; however, due to ongoing high diarrheal output, doubt postinfectious IBS.  Suspect other occult etiologies. Not sure what role possible ischemia may be playing here.  As he has not had any ongoing rectal bleeding, presentation is not entirely consistent with ischemic colitis; however, this will need to be evaluated.  Discussed with Dr. Jenetta Downer who recommended MR angiography abdomen.  Several additional studies have been ordered and are pending including GI pathogen panel, O&P, celiac serologies, stool osmolality.  Carcinoid evaluation has not been pursued as of yet.  May also need to consider repeat colonoscopy inpatient.  As steroid does not seem to be making much of a difference, we will plan to taper this a bit more quickly with decreasing prednisone by 10 mg every 5 days.  Imodium was increased to 3 times daily yesterday evening (received 2 doses yesterday) and Creon was just started this morning. By 11 AM, patient had 250 mL stool in rectal tube.  Per documentation, he had a total of 1425 mL output yesterday.   Plan: MR angio  abdomen. Per MRI tech, this will need to be without contrast due to kidney disease.  Follow-up on GI pathogen panel, O&P, celiac serologies, and stool osmolality. Continue Imodium 3 times daily. Continue Creon 72,000 units with meals and 36,000 units with snacks. Prednisone taper: Continue prednisone 30 mg to complete 5 days, then decrease prednisone by 10 mg every 5 days.  Will need complete colonoscopy as prior prep was poor at some point.  This may need to be completed inpatient. GI will continue to follow and reassess tomorrow.   LOS: 4 days    11/06/2020, 11:48 AM   Aliene Altes, PA-C [facility]

## 2020-11-06 NOTE — Progress Notes (Addendum)
PROGRESS NOTE    [name]  UYQ:[id number] DOB: 10-29-1951 DOA: 11/02/2020 [name]: [name]   Brief Narrative:  [name] HPI: [name] a 69 y.o.malewith medical history significant fortype 2 diabetes, CKD stage V recently initiated on hemodialysis MWF, atrial fibrillation/flutter on amiodarone and metoprolol and Eliquis, recurrent orthostatic hypotension, dyslipidemia, and recent discharge on 12/2 for colitis with associated adynamic ileus and acute blood loss anemia who presented back to the ED today after he was noted to have an episode of syncope while getting up to go to hemodialysis from his SNF. On arrival to the ED his systolic blood pressures were in the 70 mmHg range. He is noted to have some diarrhea, but he has recently completed course of ciprofloxacin and Flagyl by 11/23. He was noted to have nonspecific colitis on pathology after recent colonoscopy on 11/17 and was started on prednisone taper which he continues to take at this time.  -Patient continues to remain orthostatic and therefore continues to blame blood.  This is complicated by has ongoing colitis on high liquid stool output.  GI reconsulted on 12/6 to continue further evaluation.  Creon and Imodium started 3 times daily on 12/6 with some improvements already noted.  Ongoing evaluation pending.  Nephrology following for routine hemodialysis.  Started on Florinef this admission in addition to midodrine.  Further stool work-up pending and 5-HT ordered for evaluation of serotonin levels to evaluate for carcinoid.  Assessment & Plan:   Active Problems:   Syncope due to orthostatic hypotension   Syncope secondary to recurrent orthostatic hypotension -Continue midodrine as previous -Started on Florinefwhich appears to be helping -Lower extremity stockingswith abdominal binder -Plan for hemodialysis [name] nephrologyon Monday; tolerated well on 12/6 -Continues to remain orthostatic with high stool output with  repeat GI evaluation pending -May need palliative care evaluation eventually if he fails to continue improvement  Lactic acidosis likely related to above -Likely due to soft blood pressure readings with no signs of other acute infection noted -Plan to recheck in a.m.  Ongoing colitis with diarrhea -Continue prednisone 30 mg daily for 1 week and then taper by 10 mg/week thereafter -Recent treatment with ciprofloxacin and Flagyl for 10 days completed -Repeat Cdiff negative -Imodium daily increased to 3 times daily with Creon added after discussion with GI -Formal consultation with GI appreciated for further evaluation will stool studies pending -Check 5-Ht as ordered to evaluate serotonin levels and see if possible carcinoid syndrome could be a cause  ESRD on HD MWF -Appreciate nephrology evaluation for hemodialysis while inpatient, had hemodialysis 12/6  Chronic anemia-stable -Received 5 units of PRBCs during his last admission -Continue to hold apixaban [name] prior admission -No overt bleeding noted -Hemoglobin remains stable -CBC can be rechecked every other day  Paroxysmal atrial fibrillation/flutter -Continue close monitoring on telemetry -Resume home amiodarone and hold metoprolol while blood pressures are soft -Asymptomatic -Hold apixaban [name] cardiology recommendations on prior admission  Dyslipidemia -Continue Zocor  Hypoalbuminemia -Likely mild to moderate protein calorie malnutrition -Dietitian consultation   DVT prophylaxis:SCDs Code Status:Full Family Communication:Pt will call; none at bedside Disposition Plan: Status is: Inpatient  Remains inpatient appropriate because:IV treatments appropriate due to intensity of illness or inability to take PO and Inpatient level of care appropriate due to severity of illness   Dispo: The patient is from:SNF Anticipated d/c is to:SNF Anticipated d/c date  is:2-3days Patient currently is not medically stable to d/c.  Will plan to discharge once stool output has diminished  and patient less orthostatic.  GI work-up pending.  Consultants:  Nephrology  GI  Procedures:  See below  Antimicrobials:   None  Subjective: Patient seen and evaluated today with no new acute complaints or concerns. No acute concerns or events noted overnight.  He continues to have high stool output.  Denies any concerns.  Objective: Vitals:   11/05/20 2023 11/06/20 0443 11/06/20 0500 11/06/20 0800  BP: (!) 156/71 (!) 169/88  (!) 152/75  Pulse: 65 66  67  Resp: 18 18  16   Temp: 98.3 F (36.8 C) 98 F (36.7 C)  98.1 F (36.7 C)  TempSrc:    Oral  SpO2: 98% 98%  98%  Weight:   71.1 kg   Height:        Intake/Output Summary (Last 24 hours) at 11/06/2020 1120 Last data filed at 11/06/2020 0500 Gross [name] 24 hour  Intake 480 ml  Output 900 ml  Net -420 ml   Filed Weights   11/05/20 1000 11/05/20 1310 11/06/20 0500  Weight: 71 kg 71 kg 71.1 kg    Examination:  General exam: Appears calm and comfortable  Respiratory system: Clear to auscultation. Respiratory effort normal. Cardiovascular system: S1 & S2 heard, RRR.  Gastrointestinal system: Abdomen is nondistended, soft and nontender.  Central nervous system: Alert and awake Extremities: No edema Skin: No rashes, lesions or ulcers Psychiatry: Flat affect Colostomy bag with ongoing liquid stool output noted    Data Reviewed: I have personally reviewed following labs and imaging studies  CBC: Recent Labs  Lab 11/01/20 0938 11/02/20 1209 11/03/20 0416 11/04/20 0702 11/05/20 0500  WBC 4.3 10.3 5.2 5.8 9.8  NEUTROABS  --  8.4*  --   --   --   HGB 7.5* 8.6* 8.0* 8.0* 8.1*  HCT 23.6* 27.5* 25.2* 26.1* 25.4*  MCV 91.1 92.0 92.0 93.9 92.7  PLT 213 214 196 171 268   Basic Metabolic Panel: Recent Labs  Lab 11/01/20 0938 11/02/20 1209 11/03/20 0416 11/04/20 0702  11/05/20 0500  NA 129* 132* 131* 130* 128*  K 3.0* 2.9* 3.5 3.5 3.1*  CL 95* 99 99 99 97*  CO2 26 23 24 24 23   GLUCOSE 205* 157* 90 265* 235*  BUN 36* 46* 50* 46* 55*  CREATININE 2.51* 2.92* 3.22* 2.90* 3.50*  CALCIUM 7.5* 7.4* 7.6* 7.5* 7.3*  MG  --   --  2.2  --  1.7   GFR: Estimated Creatinine Clearance: 19.8 mL/min (A) (by C-G formula based on SCr of 3.5 mg/dL (H)). Liver Function Tests: Recent Labs  Lab 11/02/20 1209 11/03/20 0416  AST 15 11*  ALT 14 12  ALKPHOS 46 47  BILITOT 0.5 0.6  PROT 4.5* 4.4*  ALBUMIN 1.9* 1.9*   No results for input(s): LIPASE, AMYLASE in the last 168 hours. No results for input(s): AMMONIA in the last 168 hours. Coagulation Profile: No results for input(s): INR, PROTIME in the last 168 hours. Cardiac Enzymes: No results for input(s): CKTOTAL, CKMB, CKMBINDEX, TROPONINI in the last 168 hours. BNP (last 3 results) No results for input(s): PROBNP in the last 8760 hours. HbA1C: No results for input(s): HGBA1C in the last 72 hours. CBG: No results for input(s): GLUCAP in the last 168 hours. Lipid Profile: No results for input(s): CHOL, HDL, LDLCALC, TRIG, CHOLHDL, LDLDIRECT in the last 72 hours. Thyroid Function Tests: No results for input(s): TSH, T4TOTAL, FREET4, T3FREE, THYROIDAB in the last 72 hours. Anemia Panel: No results for input(s): VITAMINB12, FOLATE, FERRITIN,  TIBC, IRON, RETICCTPCT in the last 72 hours. Sepsis Labs: Recent Labs  Lab 11/02/20 1357  LATICACIDVEN 2.6*    Recent Results (from the past 240 hour(s))  Resp Panel by RT-PCR (Flu A&B, Covid) Nasopharyngeal Swab     Status: None   Collection Time: 10/29/20  3:03 PM   Specimen: Nasopharyngeal Swab; Nasopharyngeal(NP) swabs in vial transport medium  Result Value Ref Range Status   SARS Coronavirus 2 by RT PCR NEGATIVE NEGATIVE Final    Comment: (NOTE) SARS-CoV-2 target nucleic acids are NOT DETECTED.  The SARS-CoV-2 RNA is generally detectable in upper  respiratory specimens during the acute phase of infection. The lowest concentration of SARS-CoV-2 viral copies this assay can detect is 138 copies/mL. A negative result does not preclude SARS-Cov-2 infection and should not be used as the sole basis for treatment or other patient management decisions. A negative result may occur with  improper specimen collection/handling, submission of specimen other than nasopharyngeal swab, presence of viral mutation(s) within the areas targeted by this assay, and inadequate number of viral copies(<138 copies/mL). A negative result must be combined with clinical observations, patient history, and epidemiological information. The expected result is Negative.  Fact Sheet for Patients:  EntrepreneurPulse.com.au  Fact Sheet for Healthcare Providers:  IncredibleEmployment.be  This test is no t yet approved or cleared by the [place] FDA and  has been authorized for detection and/or diagnosis of SARS-CoV-2 by FDA under an Emergency Use Authorization (EUA). This EUA will remain  in effect (meaning this test can be used) for the duration of the COVID-19 declaration under Section 564(b)(1) of the Act, 21 U.S.C.section 360bbb-3(b)(1), unless the authorization is terminated  or revoked sooner.       Influenza A by PCR NEGATIVE NEGATIVE Final   Influenza B by PCR NEGATIVE NEGATIVE Final    Comment: (NOTE) The Xpert Xpress SARS-CoV-2/FLU/RSV plus assay is intended as an aid in the diagnosis of influenza from Nasopharyngeal swab specimens and should not be used as a sole basis for treatment. Nasal washings and aspirates are unacceptable for Xpert Xpress SARS-CoV-2/FLU/RSV testing.  Fact Sheet for Patients: EntrepreneurPulse.com.au  Fact Sheet for Healthcare Providers: IncredibleEmployment.be  This test is not yet approved or cleared by the [place] FDA and has been  authorized for detection and/or diagnosis of SARS-CoV-2 by FDA under an Emergency Use Authorization (EUA). This EUA will remain in effect (meaning this test can be used) for the duration of the COVID-19 declaration under Section 564(b)(1) of the Act, 21 U.S.C. section 360bbb-3(b)(1), unless the authorization is terminated or revoked.  Performed at [facility], [address], [place], [place] [address]   SARS Coronavirus 2 by RT PCR (hospital order, performed in [facility] hospital lab) Nasopharyngeal Nasopharyngeal Swab     Status: None   Collection Time: 11/01/20  9:11 AM   Specimen: Nasopharyngeal Swab  Result Value Ref Range Status   SARS Coronavirus 2 NEGATIVE NEGATIVE Final    Comment: (NOTE) SARS-CoV-2 target nucleic acids are NOT DETECTED.  The SARS-CoV-2 RNA is generally detectable in upper and lower respiratory specimens during the acute phase of infection. The lowest concentration of SARS-CoV-2 viral copies this assay can detect is 250 copies / mL. A negative result does not preclude SARS-CoV-2 infection and should not be used as the sole basis for treatment or other patient management decisions.  A negative result may occur with improper specimen collection / handling, submission of specimen other than nasopharyngeal swab, presence of viral mutation(s) within  the areas targeted by this assay, and inadequate number of viral copies (<250 copies / mL). A negative result must be combined with clinical observations, patient history, and epidemiological information.  Fact Sheet for Patients:   StrictlyIdeas.no  Fact Sheet for Healthcare Providers: BankingDealers.co.za  This test is not yet approved or  cleared by the [place] FDA and has been authorized for detection and/or diagnosis of SARS-CoV-2 by FDA under an Emergency Use Authorization (EUA).  This EUA will remain in effect (meaning this test can be used) for the  duration of the COVID-19 declaration under Section 564(b)(1) of the Act, 21 U.S.C. section 360bbb-3(b)(1), unless the authorization is terminated or revoked sooner.  Performed at [facility], [address] [place], [place] [address]   MRSA PCR Screening     Status: None   Collection Time: 11/02/20  2:48 PM   Specimen: Nasopharyngeal  Result Value Ref Range Status   MRSA by PCR NEGATIVE NEGATIVE Final    Comment:        The GeneXpert MRSA Assay (FDA approved for NASAL specimens only), is one component of a comprehensive MRSA colonization surveillance program. It is not intended to diagnose MRSA infection nor to guide or monitor treatment for MRSA infections. Performed at [facility], [address] [place], [place] [address]   C Difficile Quick Screen w PCR reflex     Status: None   Collection Time: 11/02/20  3:20 PM   Specimen: STOOL  Result Value Ref Range Status   C Diff antigen NEGATIVE NEGATIVE Final   C Diff toxin NEGATIVE NEGATIVE Final   C Diff interpretation No C. difficile detected.  Final    Comment: Performed at [facility], [address] [place], [place] [address]  Resp Panel by RT-PCR (Flu A&B, Covid) Nasopharyngeal Swab     Status: None   Collection Time: 11/05/20 10:41 AM   Specimen: Nasopharyngeal Swab; Nasopharyngeal(NP) swabs in vial transport medium  Result Value Ref Range Status   SARS Coronavirus 2 by RT PCR NEGATIVE NEGATIVE Final    Comment: (NOTE) SARS-CoV-2 target nucleic acids are NOT DETECTED.  The SARS-CoV-2 RNA is generally detectable in upper respiratory specimens during the acute phase of infection. The lowest concentration of SARS-CoV-2 viral copies this assay can detect is 138 copies/mL. A negative result does not preclude SARS-Cov-2 infection and should not be used as the sole basis for treatment or other patient management decisions. A negative result may occur with  improper specimen collection/handling, submission of specimen  other than nasopharyngeal swab, presence of viral mutation(s) within the areas targeted by this assay, and inadequate number of viral copies(<138 copies/mL). A negative result must be combined with clinical observations, patient history, and epidemiological information. The expected result is Negative.  Fact Sheet for Patients:  EntrepreneurPulse.com.au  Fact Sheet for Healthcare Providers:  IncredibleEmployment.be  This test is no t yet approved or cleared by the [place] FDA and  has been authorized for detection and/or diagnosis of SARS-CoV-2 by FDA under an Emergency Use Authorization (EUA). This EUA will remain  in effect (meaning this test can be used) for the duration of the COVID-19 declaration under Section 564(b)(1) of the Act, 21 U.S.C.section 360bbb-3(b)(1), unless the authorization is terminated  or revoked sooner.       Influenza A by PCR NEGATIVE NEGATIVE Final   Influenza B by PCR NEGATIVE NEGATIVE Final    Comment: (NOTE) The Xpert Xpress SARS-CoV-2/FLU/RSV plus assay is intended as an aid in the  diagnosis of influenza from Nasopharyngeal swab specimens and should not be used as a sole basis for treatment. Nasal washings and aspirates are unacceptable for Xpert Xpress SARS-CoV-2/FLU/RSV testing.  Fact Sheet for Patients: EntrepreneurPulse.com.au  Fact Sheet for Healthcare Providers: IncredibleEmployment.be  This test is not yet approved or cleared by the [place] FDA and has been authorized for detection and/or diagnosis of SARS-CoV-2 by FDA under an Emergency Use Authorization (EUA). This EUA will remain in effect (meaning this test can be used) for the duration of the COVID-19 declaration under Section 564(b)(1) of the Act, 21 U.S.C. section 360bbb-3(b)(1), unless the authorization is terminated or revoked.  Performed at [facility], [address], [place], [place]  [address]          Radiology Studies: No results found.      Scheduled Meds: . (feeding supplement) PROSource Plus  30 mL Oral BID BM  . amiodarone  200 mg Oral Daily  . Chlorhexidine Gluconate Cloth  6 each Topical Q0600  . Chlorhexidine Gluconate Cloth  6 each Topical Q0600  . [START ON 11/07/2020] darbepoetin (ARANESP) injection - DIALYSIS  100 mcg Intravenous Q Wed-HD  . feeding supplement  237 mL Oral BID BM  . fludrocortisone  0.1 mg Oral Daily  . lipase/protease/amylase  72,000 Units Oral TID WC  . loperamide  2 mg Oral TID  . metoprolol tartrate  12.5 mg Oral BID  . midodrine  10 mg Oral TID with meals  . multivitamin  1 tablet Oral QHS  . pantoprazole  40 mg Oral Daily  . predniSONE  30 mg Oral Q breakfast  . simvastatin  20 mg Oral QPM  . sodium chloride flush  3 mL Intravenous Q12H   Continuous Infusions: . sodium chloride    . sodium chloride    . sodium chloride       LOS: 4 days    Time spent: 30 minutes    Oda Lansdowne Darleen Crocker, DO [facility]  If 7PM-7AM, please contact night-coverage [email or web address] 11/06/2020, 11:20 AM

## 2020-11-06 NOTE — Plan of Care (Signed)

## 2020-11-06 NOTE — Progress Notes (Signed)
Patient ID: [name], male   DOB: 03-09-1951, 69 y.o.   MRN: [id number]   S:   HD yest-  Removed 0.5 liters with no drop in BP  Still good UOP, sig stool output  Says he feels fine today but has not been up out of bed yet   O:BP (!) 152/75 (BP Location: Left Arm)   Pulse 67   Temp 98.1 F (36.7 C) (Oral)   Resp 16   Ht 6\' 2"  (1.88 m)   Wt 71.1 kg   SpO2 98%   BMI 20.13 kg/m   Intake/Output Summary (Last 24 hours) at 11/06/2020 0837 Last data filed at 11/06/2020 0500 Gross per 24 hour  Intake 723 ml  Output 1425 ml  Net -702 ml   Intake/Output:   I/O last 3 completed shifts: In: 723 [P.O.:720; I.V.:3] Out: 2325 [Urine:1775; Stool:550]  Intake/Output this shift:  No intake/output data recorded. Weight change:    NAD RIJ TDC No sig LEE S/nt Regular CTAB Nonfocl EOMI NCAT   Recent Labs  Lab 10/31/20 0849 11/01/20 0938 11/02/20 1209 11/03/20 0416 11/04/20 0702 11/05/20 0500  NA 127* 129* 132* 131* 130* 128*  K 3.6 3.0* 2.9* 3.5 3.5 3.1*  CL 93* 95* 99 99 99 97*  CO2 24 26 23 24 24 23   GLUCOSE 176* 205* 157* 90 265* 235*  BUN 56* 36* 46* 50* 46* 55*  CREATININE 3.50* 2.51* 2.92* 3.22* 2.90* 3.50*  ALBUMIN  --   --  1.9* 1.9*  --   --   CALCIUM 7.9* 7.5* 7.4* 7.6* 7.5* 7.3*  AST  --   --  15 11*  --   --   ALT  --   --  14 12  --   --    Liver Function Tests: Recent Labs  Lab 11/02/20 1209 11/03/20 0416  AST 15 11*  ALT 14 12  ALKPHOS 46 47  BILITOT 0.5 0.6  PROT 4.5* 4.4*  ALBUMIN 1.9* 1.9*   No results for input(s): LIPASE, AMYLASE in the last 168 hours. No results for input(s): AMMONIA in the last 168 hours. CBC: Recent Labs  Lab 11/01/20 0938 11/01/20 0938 11/02/20 1209 11/02/20 1209 11/03/20 0416 11/04/20 0702 11/05/20 0500  WBC 4.3   < > 10.3   < > 5.2 5.8 9.8  NEUTROABS  --   --  8.4*  --   --   --   --   HGB 7.5*   < > 8.6*   < > 8.0* 8.0* 8.1*  HCT 23.6*   < > 27.5*   < > 25.2* 26.1* 25.4*  MCV 91.1  --  92.0  --  92.0  93.9 92.7  PLT 213   < > 214   < > 196 171 182   < > = values in this interval not displayed.   Cardiac Enzymes: No results for input(s): CKTOTAL, CKMB, CKMBINDEX, TROPONINI in the last 168 hours. CBG: No results for input(s): GLUCAP in the last 168 hours.  Iron Studies: No results for input(s): IRON, TIBC, TRANSFERRIN, FERRITIN in the last 72 hours. Studies/Results: No results found. . (feeding supplement) PROSource Plus  30 mL Oral BID BM  . amiodarone  200 mg Oral Daily  . Chlorhexidine Gluconate Cloth  6 each Topical Q0600  . feeding supplement  237 mL Oral BID BM  . fludrocortisone  0.1 mg Oral Daily  . lipase/protease/amylase  72,000 Units Oral TID WC  . loperamide  2  mg Oral TID  . metoprolol tartrate  12.5 mg Oral BID  . midodrine  10 mg Oral TID with meals  . multivitamin  1 tablet Oral QHS  . pantoprazole  40 mg Oral Daily  . predniSONE  30 mg Oral Q breakfast  . simvastatin  20 mg Oral QPM  . sodium chloride flush  3 mL Intravenous Q12H    Assessment/Plan:  1. Recurrent Syncope/hypotension; sig orthostasis:On TID Midodrine + Florinef + compression stockings. HD with minimal UF, prob exacerbated by #2 and #9 1. Keep even with HD--> sig UOP of > 1 L daily. 2. Colitis- GI following s/p ABX. on pred taper. Still with diarrhea-  GI on board to do another workup 3. [facility] M[facility]. HD tomorrow on schedule: no UF, no heparin, 3.5h, 3K 4. Anemia:of CKD- No heparin with HD. GESA with HD qWed while inpatient 5. 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 Calcium corrects; Phos low no binders encourage PO 6. PCM / Nutrition:per primary, push protein-  Last phos was low, no binder 7. H/o Hypertension:see #1 8. Vascular access- will need to f/u with Dr. Donnetta Hutching once stable for discharge. 9. A fib- on amio and MTP, AC per cardiology off apixaban 10. Hyponatremia. Monitor, mild, ASx  stable 11. Disposition- SNF   Louis Meckel  MD [facility]

## 2020-11-06 NOTE — Progress Notes (Signed)
Received call from MRI this afternoon.  Radiologist reviewed and does not feel MRI would be beneficial as they are not able to give contrast.  States patient should have CT angiography abdomen and pelvis as he is on dialysis.  Discussed with CT this afternoon who stated as dialysis was started within the last 6 months, we would need to get clearance from nephrology.  I will have to discuss this further with nephrology tomorrow.  Updated Dr. Jenetta Downer who agrees with discussing with nephrology tomorrow.

## 2020-11-07 ENCOUNTER — Inpatient Hospital Stay (HOSPITAL_COMMUNITY): Payer: Medicare Other

## 2020-11-07 DIAGNOSIS — K529 Noninfective gastroenteritis and colitis, unspecified: Secondary | ICD-10-CM

## 2020-11-07 LAB — GASTROINTESTINAL PANEL BY PCR, STOOL (REPLACES STOOL CULTURE)

## 2020-11-07 LAB — CBC
HCT: 25.7 % — ABNORMAL LOW (ref 39.0–52.0)
Hemoglobin: 8 g/dL — ABNORMAL LOW (ref 13.0–17.0)
MCH: 29.1 pg (ref 26.0–34.0)
MCHC: 31.1 g/dL (ref 30.0–36.0)
MCV: 93.5 fL (ref 80.0–100.0)
Platelets: 152 10*3/uL (ref 150–400)
RBC: 2.75 MIL/uL — ABNORMAL LOW (ref 4.22–5.81)
RDW: 19.3 % — ABNORMAL HIGH (ref 11.5–15.5)
WBC: 8.8 10*3/uL (ref 4.0–10.5)
nRBC: 0 % (ref 0.0–0.2)

## 2020-11-07 LAB — BASIC METABOLIC PANEL
Anion gap: 8 (ref 5–15)
BUN: 42 mg/dL — ABNORMAL HIGH (ref 8–23)
CO2: 24 mmol/L (ref 22–32)
Calcium: 7.3 mg/dL — ABNORMAL LOW (ref 8.9–10.3)
Chloride: 98 mmol/L (ref 98–111)
Creatinine, Ser: 3.09 mg/dL — ABNORMAL HIGH (ref 0.61–1.24)
GFR, Estimated: 21 mL/min — ABNORMAL LOW (ref >60)
Glucose, Bld: 222 mg/dL — ABNORMAL HIGH (ref 70–99)
Potassium: 3.3 mmol/L — ABNORMAL LOW (ref 3.5–5.1)
Sodium: 130 mmol/L — ABNORMAL LOW (ref 135–145)

## 2020-11-07 LAB — MAGNESIUM: Magnesium: 1.6 mg/dL — ABNORMAL LOW (ref 1.7–2.4)

## 2020-11-07 LAB — IGA: IgA: 267 mg/dL (ref 61–437)

## 2020-11-07 LAB — OSMOLALITY, STOOL: Osmolality,Stl: 627 mOsmol/kg

## 2020-11-07 MED ORDER — PREDNISONE 10 MG PO TABS
10.0000 mg | ORAL_TABLET | Freq: Every day | ORAL | Status: AC
  Administered 2020-11-13 – 2020-11-17 (×5): 10 mg via ORAL
  Filled 2020-11-07 (×5): qty 1

## 2020-11-07 MED ORDER — PREDNISONE 20 MG PO TABS
20.0000 mg | ORAL_TABLET | Freq: Every day | ORAL | Status: AC
  Administered 2020-11-08 – 2020-11-12 (×5): 20 mg via ORAL
  Filled 2020-11-07 (×5): qty 1

## 2020-11-07 MED ORDER — FAMOTIDINE 20 MG PO TABS: 40.0000 mg | ORAL_TABLET | Freq: Every day | ORAL | Status: DC

## 2020-11-07 MED ORDER — COLESTIPOL HCL 1 G PO TABS
2.0000 g | ORAL_TABLET | Freq: Every day | ORAL | Status: DC
  Administered 2020-11-07 – 2020-11-14 (×8): 2 g via ORAL
  Filled 2020-11-07 (×10): qty 2

## 2020-11-07 MED ORDER — IOHEXOL 350 MG/ML SOLN
100.0000 mL | Freq: Once | INTRAVENOUS | Status: AC | PRN
  Administered 2020-11-07: 75 mL via INTRAVENOUS

## 2020-11-07 MED ORDER — FAMOTIDINE 20 MG PO TABS
20.0000 mg | ORAL_TABLET | Freq: Every day | ORAL | Status: DC
  Administered 2020-11-08 – 2020-12-11 (×33): 20 mg via ORAL
  Filled 2020-11-07 (×34): qty 1

## 2020-11-07 NOTE — Progress Notes (Signed)
PROGRESS NOTE    [name]  KGU:[id number] DOB: 04/27/1951 DOA: 11/02/2020 PCP: Patient, No Pcp Per   Brief Narrative:  Per HPI: [name] a 69 y.o.malewith medical history significant fortype 2 diabetes, CKD stage V recently initiated on hemodialysis MWF, atrial fibrillation/flutter on amiodarone and metoprolol and Eliquis, recurrent orthostatic hypotension, dyslipidemia, and recent discharge on 12/2 for colitis with associated adynamic ileus and acute blood loss anemia who presented back to the ED today after he was noted to have an episode of syncope while getting up to go to hemodialysis from his SNF. On arrival to the ED his systolic blood pressures were in the 70 mmHg range. He is noted to have some diarrhea, but he has recently completed course of ciprofloxacin and Flagyl by 11/23. He was noted to have nonspecific colitis on pathology after recent colonoscopy on 11/17 and was started on prednisone taper which he continues to take at this time.  -Patient continues to remain orthostatic and therefore continues to blame blood.  This is complicated by has ongoing colitis on high liquid stool output.  GI reconsulted on 12/6 to continue further evaluation.  Creon and Imodium started 3 times daily on 12/6 with some improvements already noted.  Ongoing evaluation pending.  Nephrology following for routine hemodialysis.  Started on Florinef this admission in addition to midodrine.  Further stool work-up pending and 5-HT ordered for evaluation of serotonin levels to evaluate for carcinoid.  Assessment & Plan:   Active Problems:   Syncope due to orthostatic hypotension  Syncope secondary to recurrent orthostatic hypotension -Continue midodrine as previous -Started on Florinefwhich appears to be helping -Lower extremity stockingswith abdominal binder -Plan for hemodialysis per nephrology12/6, 12/8.  -Continues to remain orthostatic with high stool output with repeat GI evaluation  pending -May need palliative care evaluation eventually if he fails to continue improvement  Lactic acidosis likely related to above -Likely due to soft blood pressure readings with no signs of other acute infection noted  Ongoing colitis with diarrhea -Continue prednisone 30 mg daily for 1 week and then taper by 10 mg/week thereafter -Recent treatment with ciprofloxacin and Flagyl for 10 days completed -Repeat Cdiff negative -Imodium daily increased to 3 times daily with Creon added after discussion with GI -Formal consultation with GI appreciated for further evaluation will stool studies pending -Check 5-Ht as ordered to evaluate serotonin levels and see if possible carcinoid syndrome could be a cause  ESRD on HD MWF -Appreciate nephrology evaluation for hemodialysis while inpatient, had hemodialysis 12/6  Chronic anemia-stable -Received 5 units of PRBCs during his last admission -Continue to hold apixaban per prior admission -No overt bleeding noted -Hemoglobin remains stable -CBC can be rechecked every other day  Paroxysmal atrial fibrillation/flutter -Continue close monitoring on telemetry -Resume home amiodarone and hold metoprolol while blood pressures are soft -Asymptomatic -Hold apixaban per cardiology recommendations on prior admission  Dyslipidemia -Continue Zocor  Hypoalbuminemia -Likely mild to moderate protein calorie malnutrition -Dietitian consultation  DVT prophylaxis:SCDs Code Status:Full Family Communication:Pt prefers to call; none at bedside Disposition Plan: SNF when discharged Status is: Inpatient  Remains inpatient appropriate because:IV treatments appropriate due to intensity of illness or inability to take PO and Inpatient level of care appropriate due to severity of illness  Dispo: The patient is from:SNF Anticipated d/c is to:SNF Anticipated d/c date is:2-3days Patient currently is  not medically stable to d/c.  Will plan to discharge once stool output has diminished and patient less orthostatic.  GI work-up  pending.  Nephrology agreeable to CTA with contrast.   Consultants:  Nephrology  GI  Procedures:  See below  Antimicrobials:   None  Subjective: Patient is reporting that he feels mostly unchanged but does not feel worse.   Objective: Vitals:   11/07/20 1230 11/07/20 1300 11/07/20 1330 11/07/20 1400  BP: (!) 156/90 137/82 138/78 125/72  Pulse: 79 84 70 78  Resp:      Temp:      TempSrc:      SpO2:      Weight:      Height:        Intake/Output Summary (Last 24 hours) at 11/07/2020 1433 Last data filed at 11/07/2020 1300 Gross per 24 hour  Intake 363 ml  Output 1290 ml  Net -927 ml   Filed Weights   11/06/20 0500 11/07/20 0348 11/07/20 1050  Weight: 71.1 kg 71.2 kg 71.2 kg    Examination:  General exam: awake, alert, NAD, Appears calm and comfortable  Respiratory system: Clear to auscultation. Respiratory effort normal. Cardiovascular system: normal S1 & S2 heard.  Gastrointestinal system: Abdomen is nondistended, soft and nontender.  Central nervous system: Alert and awake Extremities: No edema Skin: No rashes, lesions or ulcers Psychiatry: Flat affect unchanged fecal bag with large amount of liquid stool output  Data Reviewed: I have personally reviewed following labs and imaging studies  CBC: Recent Labs  Lab 11/02/20 1209 11/03/20 0416 11/04/20 0702 11/05/20 0500 11/07/20 1047  WBC 10.3 5.2 5.8 9.8 8.8  NEUTROABS 8.4*  --   --   --   --   HGB 8.6* 8.0* 8.0* 8.1* 8.0*  HCT 27.5* 25.2* 26.1* 25.4* 25.7*  MCV 92.0 92.0 93.9 92.7 93.5  PLT 214 196 171 182 443   Basic Metabolic Panel: Recent Labs  Lab 11/02/20 1209 11/03/20 0416 11/04/20 0702 11/05/20 0500 11/07/20 0500  NA 132* 131* 130* 128* 130*  K 2.9* 3.5 3.5 3.1* 3.3*  CL 99 99 99 97* 98  CO2 23 24 24 23 24   GLUCOSE 157* 90 265* 235* 222*  BUN 46*  50* 46* 55* 42*  CREATININE 2.92* 3.22* 2.90* 3.50* 3.09*  CALCIUM 7.4* 7.6* 7.5* 7.3* 7.3*  MG  --  2.2  --  1.7 1.6*   GFR: Estimated Creatinine Clearance: 22.4 mL/min (A) (by C-G formula based on SCr of 3.09 mg/dL (H)). Liver Function Tests: Recent Labs  Lab 11/02/20 1209 11/03/20 0416  AST 15 11*  ALT 14 12  ALKPHOS 46 47  BILITOT 0.5 0.6  PROT 4.5* 4.4*  ALBUMIN 1.9* 1.9*   No results for input(s): LIPASE, AMYLASE in the last 168 hours. No results for input(s): AMMONIA in the last 168 hours. Coagulation Profile: No results for input(s): INR, PROTIME in the last 168 hours. Cardiac Enzymes: No results for input(s): CKTOTAL, CKMB, CKMBINDEX, TROPONINI in the last 168 hours. BNP (last 3 results) No results for input(s): PROBNP in the last 8760 hours. HbA1C: No results for input(s): HGBA1C in the last 72 hours. CBG: No results for input(s): GLUCAP in the last 168 hours. Lipid Profile: No results for input(s): CHOL, HDL, LDLCALC, TRIG, CHOLHDL, LDLDIRECT in the last 72 hours. Thyroid Function Tests: No results for input(s): TSH, T4TOTAL, FREET4, T3FREE, THYROIDAB in the last 72 hours. Anemia Panel: No results for input(s): VITAMINB12, FOLATE, FERRITIN, TIBC, IRON, RETICCTPCT in the last 72 hours. Sepsis Labs: Recent Labs  Lab 11/02/20 1357  LATICACIDVEN 2.6*    Recent Results (from the past  240 hour(s))  Resp Panel by RT-PCR (Flu A&B, Covid) Nasopharyngeal Swab     Status: None   Collection Time: 10/29/20  3:03 PM   Specimen: Nasopharyngeal Swab; Nasopharyngeal(NP) swabs in vial transport medium  Result Value Ref Range Status   SARS Coronavirus 2 by RT PCR NEGATIVE NEGATIVE Final    Comment: (NOTE) SARS-CoV-2 target nucleic acids are NOT DETECTED.  The SARS-CoV-2 RNA is generally detectable in upper respiratory specimens during the acute phase of infection. The lowest concentration of SARS-CoV-2 viral copies this assay can detect is 138 copies/mL. A negative  result does not preclude SARS-Cov-2 infection and should not be used as the sole basis for treatment or other patient management decisions. A negative result may occur with  improper specimen collection/handling, submission of specimen other than nasopharyngeal swab, presence of viral mutation(s) within the areas targeted by this assay, and inadequate number of viral copies(<138 copies/mL). A negative result must be combined with clinical observations, patient history, and epidemiological information. The expected result is Negative.  Fact Sheet for Patients:  EntrepreneurPulse.com.au  Fact Sheet for Healthcare Providers:  IncredibleEmployment.be  This test is no t yet approved or cleared by the [place] FDA and  has been authorized for detection and/or diagnosis of SARS-CoV-2 by FDA under an Emergency Use Authorization (EUA). This EUA will remain  in effect (meaning this test can be used) for the duration of the COVID-19 declaration under Section 564(b)(1) of the Act, 21 U.S.C.section 360bbb-3(b)(1), unless the authorization is terminated  or revoked sooner.       Influenza A by PCR NEGATIVE NEGATIVE Final   Influenza B by PCR NEGATIVE NEGATIVE Final    Comment: (NOTE) The Xpert Xpress SARS-CoV-2/FLU/RSV plus assay is intended as an aid in the diagnosis of influenza from Nasopharyngeal swab specimens and should not be used as a sole basis for treatment. Nasal washings and aspirates are unacceptable for Xpert Xpress SARS-CoV-2/FLU/RSV testing.  Fact Sheet for Patients: EntrepreneurPulse.com.au  Fact Sheet for Healthcare Providers: IncredibleEmployment.be  This test is not yet approved or cleared by the [place] FDA and has been authorized for detection and/or diagnosis of SARS-CoV-2 by FDA under an Emergency Use Authorization (EUA). This EUA will remain in effect (meaning this test can be used)  for the duration of the COVID-19 declaration under Section 564(b)(1) of the Act, 21 U.S.C. section 360bbb-3(b)(1), unless the authorization is terminated or revoked.  Performed at [facility], [address], [place],  [address]   SARS Coronavirus 2 by RT PCR (hospital order, performed in [facility] hospital lab) Nasopharyngeal Nasopharyngeal Swab     Status: None   Collection Time: 11/01/20  9:11 AM   Specimen: Nasopharyngeal Swab  Result Value Ref Range Status   SARS Coronavirus 2 NEGATIVE NEGATIVE Final    Comment: (NOTE) SARS-CoV-2 target nucleic acids are NOT DETECTED.  The SARS-CoV-2 RNA is generally detectable in upper and lower respiratory specimens during the acute phase of infection. The lowest concentration of SARS-CoV-2 viral copies this assay can detect is 250 copies / mL. A negative result does not preclude SARS-CoV-2 infection and should not be used as the sole basis for treatment or other patient management decisions.  A negative result may occur with improper specimen collection / handling, submission of specimen other than nasopharyngeal swab, presence of viral mutation(s) within the areas targeted by this assay, and inadequate number of viral copies (<250 copies / mL). A negative result must be combined with clinical observations, patient history,  and epidemiological information.  Fact Sheet for Patients:   StrictlyIdeas.no  Fact Sheet for Healthcare Providers: BankingDealers.co.za  This test is not yet approved or  cleared by the [place] FDA and has been authorized for detection and/or diagnosis of SARS-CoV-2 by FDA under an Emergency Use Authorization (EUA).  This EUA will remain in effect (meaning this test can be used) for the duration of the COVID-19 declaration under Section 564(b)(1) of the Act, 21 U.S.C. section 360bbb-3(b)(1), unless the authorization is terminated or revoked  sooner.  Performed at [facility], [address] [place], [place] [address]   MRSA PCR Screening     Status: None   Collection Time: 11/02/20  2:48 PM   Specimen: Nasopharyngeal  Result Value Ref Range Status   MRSA by PCR NEGATIVE NEGATIVE Final    Comment:        The GeneXpert MRSA Assay (FDA approved for NASAL specimens only), is one component of a comprehensive MRSA colonization surveillance program. It is not intended to diagnose MRSA infection nor to guide or monitor treatment for MRSA infections. Performed at [facility], [place], [address] [place], [place] [address]   C Difficile Quick Screen w PCR reflex     Status: None   Collection Time: 11/02/20  3:20 PM   Specimen: STOOL  Result Value Ref Range Status   C Diff antigen NEGATIVE NEGATIVE Final   C Diff toxin NEGATIVE NEGATIVE Final   C Diff interpretation No C. difficile detected.  Final    Comment: Performed at [facility], [address] [place], [place] [address]  Resp Panel by RT-PCR (Flu A&B, Covid) Nasopharyngeal Swab     Status: None   Collection Time: 11/05/20 10:41 AM   Specimen: Nasopharyngeal Swab; Nasopharyngeal(NP) swabs in vial transport medium  Result Value Ref Range Status   SARS Coronavirus 2 by RT PCR NEGATIVE NEGATIVE Final    Comment: (NOTE) SARS-CoV-2 target nucleic acids are NOT DETECTED.  The SARS-CoV-2 RNA is generally detectable in upper respiratory specimens during the acute phase of infection. The lowest concentration of SARS-CoV-2 viral copies this assay can detect is 138 copies/mL. A negative result does not preclude SARS-Cov-2 infection and should not be used as the sole basis for treatment or other patient management decisions. A negative result may occur with  improper specimen collection/handling, submission of specimen other than nasopharyngeal swab, presence of viral mutation(s) within the areas targeted by this assay, and inadequate number of viral copies(<138 copies/mL). A  negative result must be combined with clinical observations, patient history, and epidemiological information. The expected result is Negative.  Fact Sheet for Patients:  EntrepreneurPulse.com.au  Fact Sheet for Healthcare Providers:  IncredibleEmployment.be  This test is no t yet approved or cleared by the [place] FDA and  has been authorized for detection and/or diagnosis of SARS-CoV-2 by FDA under an Emergency Use Authorization (EUA). This EUA will remain  in effect (meaning this test can be used) for the duration of the COVID-19 declaration under Section 564(b)(1) of the Act, 21 U.S.C.section 360bbb-3(b)(1), unless the authorization is terminated  or revoked sooner.       Influenza A by PCR NEGATIVE NEGATIVE Final   Influenza B by PCR NEGATIVE NEGATIVE Final    Comment: (NOTE) The Xpert Xpress SARS-CoV-2/FLU/RSV plus assay is intended as an aid in the diagnosis of influenza from Nasopharyngeal swab specimens and should not be used as a sole basis for treatment. Nasal washings and aspirates are unacceptable for Xpert Xpress  SARS-CoV-2/FLU/RSV testing.  Fact Sheet for Patients: EntrepreneurPulse.com.au  Fact Sheet for Healthcare Providers: IncredibleEmployment.be  This test is not yet approved or cleared by the [place] FDA and has been authorized for detection and/or diagnosis of SARS-CoV-2 by FDA under an Emergency Use Authorization (EUA). This EUA will remain in effect (meaning this test can be used) for the duration of the COVID-19 declaration under Section 564(b)(1) of the Act, 21 U.S.C. section 360bbb-3(b)(1), unless the authorization is terminated or revoked.  Performed at [facility], [address], [place], [place] [address]   Gastrointestinal Panel by PCR , Stool     Status: None   Collection Time: 11/06/20  1:25 PM   Specimen: Stool  Result Value Ref Range Status    Campylobacter species NOT DETECTED NOT DETECTED Final   Plesimonas shigelloides NOT DETECTED NOT DETECTED Final   Salmonella species NOT DETECTED NOT DETECTED Final   Yersinia enterocolitica NOT DETECTED NOT DETECTED Final   Vibrio species NOT DETECTED NOT DETECTED Final   Vibrio cholerae NOT DETECTED NOT DETECTED Final   Enteroaggregative E coli (EAEC) NOT DETECTED NOT DETECTED Final   Enteropathogenic E coli (EPEC) NOT DETECTED NOT DETECTED Final   Enterotoxigenic E coli (ETEC) NOT DETECTED NOT DETECTED Final   Shiga like toxin producing E coli (STEC) NOT DETECTED NOT DETECTED Final   Shigella/Enteroinvasive E coli (EIEC) NOT DETECTED NOT DETECTED Final   Cryptosporidium NOT DETECTED NOT DETECTED Final   Cyclospora cayetanensis NOT DETECTED NOT DETECTED Final   Entamoeba histolytica NOT DETECTED NOT DETECTED Final   Giardia lamblia NOT DETECTED NOT DETECTED Final   Adenovirus F40/41 NOT DETECTED NOT DETECTED Final   Astrovirus NOT DETECTED NOT DETECTED Final   Norovirus GI/GII NOT DETECTED NOT DETECTED Final   Rotavirus A NOT DETECTED NOT DETECTED Final   Sapovirus (I, II, IV, and V) NOT DETECTED NOT DETECTED Final    Comment: Performed at [facility], [address], [place], [place] [address]    Radiology Studies: No results found.  Scheduled Meds: . (feeding supplement) PROSource Plus  30 mL Oral BID BM  . amiodarone  200 mg Oral Daily  . Chlorhexidine Gluconate Cloth  6 each Topical Q0600  . Chlorhexidine Gluconate Cloth  6 each Topical Q0600  . colestipol  2 g Oral Daily  . darbepoetin (ARANESP) injection - DIALYSIS  100 mcg Intravenous Q Wed-HD  . feeding supplement  237 mL Oral BID BM  . fludrocortisone  0.1 mg Oral Daily  . lipase/protease/amylase  36,000 Units Oral With snacks  . lipase/protease/amylase  72,000 Units Oral TID WC  . loperamide  2 mg Oral TID  . metoprolol tartrate  12.5 mg Oral BID  . midodrine  10 mg Oral TID with meals  . multivitamin  1  tablet Oral QHS  . pantoprazole  40 mg Oral Daily  . predniSONE  30 mg Oral Q breakfast  . simvastatin  20 mg Oral QPM  . sodium chloride flush  3 mL Intravenous Q12H   Continuous Infusions: . sodium chloride    . sodium chloride    . sodium chloride       LOS: 5 days   Time spent: 37 minutes  Danile Trier Wynetta Emery, MD How to contact the [facility] Attending or Consulting provider [place] or covering provider during after hours [place], for this patient?  1. Check the care team in [facility] and look for a) attending/consulting [facility] provider listed and b) the [facility] team listed  2. Log into [email or web address] and use 's universal password to access. If you do not have the password, please contact the hospital operator. 3. Locate the [facility] provider you are looking for under [facility] and page to a number that you can be directly reached. 4. If you still have difficulty reaching the provider, please page the [facility] (Director on Call) for the Hospitalists listed on amion for assistance.   If 7PM-7AM, please contact night-coverage [email or web address] 11/07/2020, 2:33 PM

## 2020-11-07 NOTE — Progress Notes (Signed)
Subjective: Continues to feel well. Undergoing hemodialysis at the time of my visit with him today. Denies abdominal pain, nausea, vomiting, lightheadedness. Diarrhea continues. Rectal tube is in place. Total of 1900 mL output yesterday. About 100 mL of brown liquid stool in collecting bag at the time of my visit with him today around 11:45 AM.  Spoke with Dr. Moshe Cipro this morning. She agrees with CTA A/P. No need to coordinate with dialysis.   Objective: Vital signs in last 24 hours: Temp:  [97.5 F (36.4 C)-98.1 F (36.7 C)] 98 F (36.7 C) (12/08 1050) Pulse Rate:  [63-69] 67 (12/08 1130) Resp:  [15-18] 16 (12/08 1050) BP: (127-165)/(75-89) 156/85 (12/08 1130) SpO2:  [97 %-98 %] 98 % (12/08 1050) Weight:  [71.2 kg] 71.2 kg (12/08 1050) Last BM Date:  (flex-seal) General:   Alert and oriented, pleasant, undergoing hemodialysis, no acute distress. Head:  Normocephalic and atraumatic. Eyes:  No icterus, sclera clear. Conjuctiva pink.  Abdomen:  Bowel sounds present, soft, non-tender, non-distended. No HSM or hernias noted. No rebound or guarding. No masses appreciated  Msk:  Symmetrical without gross deformities. Normal posture. Extremities:  Without edema. Neurologic:  Alert and  oriented x4;  grossly normal neurologically. Skin:  Warm and dry, intact without significant lesions.  Psych:  Normal mood and affect.  Intake/Output from previous day: 12/07 0701 - 12/08 0700 In: 73 [P.O.:960] Out: 1090 [Urine:840; Stool:250] Intake/Output this shift: Total I/O In: 3 [I.V.:3] Out: -   Lab Results: Recent Labs    11/05/20 0500 11/07/20 1047  WBC 9.8 8.8  HGB 8.1* 8.0*  HCT 25.4* 25.7*  PLT 182 152   BMET Recent Labs    11/05/20 0500 11/07/20 0500  NA 128* 130*  K 3.1* 3.3*  CL 97* 98  CO2 23 24  GLUCOSE 235* 222*  BUN 55* 42*  CREATININE 3.50* 3.09*  CALCIUM 7.3* 7.3*    Assessment: 69 year old male well known to [place] from recent several week admission  for diarrhea and rectal bleeding, found to have CT findings (without contrast) of colitis,developing rectal bleeding during hospitalization in setting of Eliquis, receivingmultiple units ofPRBCs,undergoing colonoscopy during recentadmission with poor prep and incomplete exam. Blood noted in rectum, sigmoid, descending colon, and at splenic flexure. Colonoscopy findings suspicious for segmental or ischemic colitis. Pathology without changes or features of IBD, query drug-induced or ischemic. CMV stains negative.He received empiric course of Cipro and Flagyl and was also empirically started on steroids with mild improvement in diarrhea. C. difficile and GI pathogen panel were negative. He presented back to the hospital 12/3 due to syncopal episode while getting up to go to dialysis from SNF. Orthostatic hypotension being managed with midodrine, Florinef, supportive measures with lower extremity compression and abdominal binder. Keeping up with GI losses has been a challenge.  Due to ongoing watery diarrhea, GI was consulted again.   Diarrhea: He is currently on Imodium 3 times daily and Creon 72,000 units with meals and 36,000 units with snacks.  Total output yesterday 1090, improved slightly compared to the day prior with total output of 1425. Continues with watery diarrhea this morning with about 100 mL of brown liquid stool in collecting bag by 11:45 this morning. No other significant symptoms.   Etiology of ongoing watery diarrhea is not clear.  Initially, symptoms seem to have started after an acute gastroenteritis; however, due to ongoing high diarrheal output, doubt postinfectious IBS.  Suspect other occult etiologies. Not sure what role possible ischemia may be  playing here.  As he has not had any ongoing rectal bleeding, presentation is not entirely consistent with ischemic colitis; however, this will need to be evaluated. Unable to complete MRI yesterday. Spoke with nephrology today who agrees with  CTA A/P, does not necessarily have to be correlated with dialysis. Repeat GI pathogen panel was negative.  Stool osmolality 627. Waiting on celiac serologies, O&P, and serum serotonin. Could also have med effect from pantoprazole.  We will add Colestid 2 mg daily today. As steroids have not made much of a difference, planning to taper by 10 mg every 5 days.  May also need to try discontinuing pantoprazole. Pending results, may need to consider stool electrolytes, additional work-up for carcinoid syndrome, or trial of fasting to see if diarrhea improves.  May also need repeat colonoscopy.  Plan: 1.  CTA A/P with and without contrast. 2.  Add Colestid 2 mg daily. 3.  Continue Imodium 3 times daily. 4.  Continue Creon. 5.  Follow-up on pending stool studies and serologies. 6.  Continue prednisone taper: Taper by 10 mg q. 5 days. Will go ahead and change order as today is day 5 of prednisone 30 mg. 7.  He will need complete colonoscopy at some point as prior prep was poor.  8.  GI to continue to follow and reassess tomorrow.   LOS: 5 days    11/07/2020, 11:47 AM   Aliene Altes, PA-C [facility]

## 2020-11-07 NOTE — Procedures (Signed)
   HEMODIALYSIS TREATMENT NOTE:  3.5 hour heparin-free HD completed via RIJ TDC. Exit site is unremarkable. Kept even / no UF.  All blood was returned.  Hemodynamically stable.  Declined orthostatic pressure check post-HD.  No changes from pre-HD assessment.  Rockwell Alexandria, RN

## 2020-11-07 NOTE — Progress Notes (Signed)
Patient ID: [name], male   DOB: [birth year], 69 y.o.   MRN: [id number]   S:    Still good UOP, sig stool output  Says he feels fine-  Is less dizzy when sits up   Due for HD later today  Wanting to get CT scan-  Want our permission for IV contrast, is OK   O:BP (!) 150/85   Pulse 64   Temp 98.1 F (36.7 C)   Resp 18   Ht 6\' 2"  (1.88 m)   Wt 71.2 kg   SpO2 97%   BMI 20.15 kg/m   Intake/Output Summary (Last 24 hours) at 11/07/2020 0836 Last data filed at 11/07/2020 0500 Gross per 24 hour  Intake 960 ml  Output 1090 ml  Net -130 ml   Intake/Output:   I/O last 3 completed shifts: In: 960 [P.O.:960] Out: 1290 [Urine:1040; Stool:250]  Intake/Output this shift:  No intake/output data recorded. Weight change: 0.2 kg   NAD RIJ TDC No sig LEE S/nt Regular CTAB Nonfocl EOMI NCAT   Recent Labs  Lab 10/31/20 0849 11/01/20 0938 11/02/20 1209 11/03/20 0416 11/04/20 0702 11/05/20 0500 11/07/20 0500  NA 127* 129* 132* 131* 130* 128* 130*  K 3.6 3.0* 2.9* 3.5 3.5 3.1* 3.3*  CL 93* 95* 99 99 99 97* 98  CO2 24 26 23 24 24 23 24   GLUCOSE 176* 205* 157* 90 265* 235* 222*  BUN 56* 36* 46* 50* 46* 55* 42*  CREATININE 3.50* 2.51* 2.92* 3.22* 2.90* 3.50* 3.09*  ALBUMIN  --   --  1.9* 1.9*  --   --   --   CALCIUM 7.9* 7.5* 7.4* 7.6* 7.5* 7.3* 7.3*  AST  --   --  15 11*  --   --   --   ALT  --   --  14 12  --   --   --    Liver Function Tests: Recent Labs  Lab 11/02/20 1209 11/03/20 0416  AST 15 11*  ALT 14 12  ALKPHOS 46 47  BILITOT 0.5 0.6  PROT 4.5* 4.4*  ALBUMIN 1.9* 1.9*   No results for input(s): LIPASE, AMYLASE in the last 168 hours. No results for input(s): AMMONIA in the last 168 hours. CBC: Recent Labs  Lab 11/01/20 0938 11/01/20 0938 11/02/20 1209 11/02/20 1209 11/03/20 0416 11/04/20 0702 11/05/20 0500  WBC 4.3   < > 10.3   < > 5.2 5.8 9.8  NEUTROABS  --   --  8.4*  --   --   --   --   HGB 7.5*   < > 8.6*   < > 8.0* 8.0* 8.1*  HCT 23.6*    < > 27.5*   < > 25.2* 26.1* 25.4*  MCV 91.1  --  92.0  --  92.0 93.9 92.7  PLT 213   < > 214   < > 196 171 182   < > = values in this interval not displayed.   Cardiac Enzymes: No results for input(s): CKTOTAL, CKMB, CKMBINDEX, TROPONINI in the last 168 hours. CBG: No results for input(s): GLUCAP in the last 168 hours.  Iron Studies: No results for input(s): IRON, TIBC, TRANSFERRIN, FERRITIN in the last 72 hours. Studies/Results: No results found. . (feeding supplement) PROSource Plus  30 mL Oral BID BM  . amiodarone  200 mg Oral Daily  . Chlorhexidine Gluconate Cloth  6 each Topical Q0600  . Chlorhexidine Gluconate Cloth  6 each Topical Q0600  .  darbepoetin (ARANESP) injection - DIALYSIS  100 mcg Intravenous Q Wed-HD  . feeding supplement  237 mL Oral BID BM  . fludrocortisone  0.1 mg Oral Daily  . lipase/protease/amylase  36,000 Units Oral With snacks  . lipase/protease/amylase  72,000 Units Oral TID WC  . loperamide  2 mg Oral TID  . metoprolol tartrate  12.5 mg Oral BID  . midodrine  10 mg Oral TID with meals  . multivitamin  1 tablet Oral QHS  . pantoprazole  40 mg Oral Daily  . predniSONE  30 mg Oral Q breakfast  . simvastatin  20 mg Oral QPM  . sodium chloride flush  3 mL Intravenous Q12H    Assessment/Plan:  1. Recurrent Syncope/hypotension; sig orthostasis:On TID Midodrine + Florinef + compression stockings. HD with minimal UF, prob exacerbated by #2 and #9 1. Keep even with HD--> sig UOP of > 1 L daily as well as copious stool output. 2. Colitis- GI following s/p ABX. on pred taper. Still with diarrhea-  GI on board to do another workup-  Wanting to do CT scan with IV contrast which is fine with [place] 3. ESRDrecent start on MWF at [facility] via [facility] HD today on schedule: no UF, no heparin, 3.5h, 3K 4. Anemia:of CKD- No heparin with HD. ESA with HD qWed while inpatient 5. CKD-MBD:continue with home meds.Calcitriol 0.65mcg daily (was on this as an  outpatient). Calcium corrects; Phos low no binders encourage PO 6. PCM / Nutrition:per primary, push protein-  Last phos was low, no binder 7. H/o Hypertension:see #1-  Will likely be one of those patients who do not need much fluid removed at all with HD 8. Vascular access- will need to f/u with Dr. Donnetta Hutching for AVF once stable for discharge. 9. A fib- on amio and MTP, AC per cardiology off apixaban 10. Hyponatremia. Monitor, mild, ASx stable 11. Disposition- SNF   Louis Meckel  MD [facility]

## 2020-11-07 NOTE — Progress Notes (Signed)
Pt left unit for CT abd

## 2020-11-08 DIAGNOSIS — N186 End stage renal disease: Secondary | ICD-10-CM

## 2020-11-08 DIAGNOSIS — Z992 Dependence on renal dialysis: Secondary | ICD-10-CM

## 2020-11-08 LAB — TISSUE TRANSGLUTAMINASE, IGA: Tissue Transglutaminase Ab, IgA: <2 U/mL (ref 0–3)

## 2020-11-08 LAB — RENAL FUNCTION PANEL
Albumin: 2 g/dL — ABNORMAL LOW (ref 3.5–5.0)
Anion gap: 8 (ref 5–15)
BUN: 30 mg/dL — ABNORMAL HIGH (ref 8–23)
CO2: 24 mmol/L (ref 22–32)
Calcium: 7.1 mg/dL — ABNORMAL LOW (ref 8.9–10.3)
Chloride: 99 mmol/L (ref 98–111)
Creatinine, Ser: 2.57 mg/dL — ABNORMAL HIGH (ref 0.61–1.24)
GFR, Estimated: 26 mL/min — ABNORMAL LOW (ref >60)
Glucose, Bld: 185 mg/dL — ABNORMAL HIGH (ref 70–99)
Phosphorus: 2.2 mg/dL — ABNORMAL LOW (ref 2.5–4.6)
Potassium: 3.4 mmol/L — ABNORMAL LOW (ref 3.5–5.1)
Sodium: 131 mmol/L — ABNORMAL LOW (ref 135–145)

## 2020-11-08 LAB — CBC
HCT: 23.9 % — ABNORMAL LOW (ref 39.0–52.0)
Hemoglobin: 7.5 g/dL — ABNORMAL LOW (ref 13.0–17.0)
MCH: 29.1 pg (ref 26.0–34.0)
MCHC: 31.4 g/dL (ref 30.0–36.0)
MCV: 92.6 fL (ref 80.0–100.0)
Platelets: 147 10*3/uL — ABNORMAL LOW (ref 150–400)
RBC: 2.58 MIL/uL — ABNORMAL LOW (ref 4.22–5.81)
RDW: 19.6 % — ABNORMAL HIGH (ref 11.5–15.5)
WBC: 6.6 10*3/uL (ref 4.0–10.5)
nRBC: 0 % (ref 0.0–0.2)

## 2020-11-08 LAB — RETICULOCYTES
Immature Retic Fract: 28.8 % — ABNORMAL HIGH (ref 2.3–15.9)
RBC.: 2.59 MIL/uL — ABNORMAL LOW (ref 4.22–5.81)
Retic Count, Absolute: 66.3 10*3/uL (ref 19.0–186.0)
Retic Ct Pct: 2.6 % (ref 0.4–3.1)

## 2020-11-08 LAB — OVA + PARASITE EXAM

## 2020-11-08 LAB — O&P RESULT

## 2020-11-08 NOTE — Progress Notes (Signed)
PROGRESS NOTE    [name]  FMB:[id number] DOB: [birth year] DOA: 11/02/2020 PCP: Patient, No Pcp Per   Brief Narrative:  Per HPI: [name] a 69 y.o.malewith medical history significant fortype 2 diabetes, CKD stage V recently initiated on hemodialysis MWF, atrial fibrillation/flutter on amiodarone and metoprolol and Eliquis, recurrent orthostatic hypotension, dyslipidemia, and recent discharge on 12/2 for colitis with associated adynamic ileus and acute blood loss anemia who presented back to the ED today after he was noted to have an episode of syncope while getting up to go to hemodialysis from his SNF. On arrival to the ED his systolic blood pressures were in the 70 mmHg range. He is noted to have some diarrhea, but he has recently completed course of ciprofloxacin and Flagyl by 11/23. He was noted to have nonspecific colitis on pathology after recent colonoscopy on 11/17 and was started on prednisone taper which he continues to take at this time.  -Patient continues to remain orthostatic and therefore continues to blame blood.  This is complicated by has ongoing colitis on high liquid stool output.  GI reconsulted on 12/6 to continue further evaluation.  Creon and Imodium started 3 times daily on 12/6 however patient continues to have copious stool output, have been unable to remove rectal tube.    Assessment & Plan:   Active Problems:   Syncope due to orthostatic hypotension  Syncope secondary to recurrent orthostatic hypotension -Continue midodrine as previous -Started on Florinefwhich is helping -Lower extremity stockingswith abdominal binder -Plan for hemodialysis per nephrology12/6, 12/8, 12/10.  -May need palliative care evaluation eventually if he fails to continue improvement  Lactic acidosis likely related to above -Likely due to soft blood pressure readings with no signs of other acute infection noted  Ongoing colitis with diarrhea -Continue prednisone 30  mg daily for 1 week and then taper by 10 mg/week thereafter -Recent treatment with ciprofloxacin and Flagyl for 10 days completed -Repeat Cdiff negative -Imodium daily increased to 3 times daily with Creon added after discussion with GI -Formal consultation with GI appreciated for further evaluation -Check 5-Ht as ordered to evaluate serotonin levels and see if possible carcinoid syndrome could be a cause  ESRD on HD MWF -Appreciate nephrology evaluation for hemodialysis while inpatient, had hemodialysis 12/6, 12/8 with minimal to no fluid removal  Chronic anemia-stable -Received 5 units of PRBCs during his last admission -Continue to hold apixaban per prior admission recommendations -No overt bleeding noted -Hemoglobin remains stable -CBC being monitored  Paroxysmal atrial fibrillation/flutter -Continue close monitoring on telemetry -Resume home amiodarone and hold metoprolol while blood pressures are soft -Asymptomatic -Hold apixaban per cardiology recommendations on prior admission  Dyslipidemia -Continue Zocor  Hypoalbuminemia -Likely mild to moderate protein calorie malnutrition -Dietitian consultation appreciated  DVT prophylaxis:SCDs Code Status:Full Family Communication:Pt prefers to call; none at bedside Disposition Plan: SNF when discharged Status is: Inpatient  Remains inpatient appropriate because:IV treatments appropriate due to intensity of illness or inability to take PO and Inpatient level of care appropriate due to severity of illness  Dispo: The patient is from:SNF Anticipated d/c is to:SNF Anticipated d/c date is:3days Patient currently is not medically stable to d/c.  Will plan to discharge once stool output has diminished and patient less orthostatic.  GI work-up ongoing.  Ongoing hemodialysis treatments.   Consultants:  Nephrology  GI  Procedures:  See below  Antimicrobials:    None  Subjective: Patient reporting cramps in abdomen, thinks it is gas pains   Objective: Vitals:  11/07/20 1435 11/07/20 2054 11/08/20 0636 11/08/20 1341  BP: 124/72 (!) 147/78 (!) 145/72 (!) 161/84  Pulse: 70 65 76 67  Resp: 18 20 18 18   Temp: 98 F (36.7 C) 98.8 F (37.1 C) 98.7 F (37.1 C) 97.9 F (36.6 C)  TempSrc: Oral Oral Oral Oral  SpO2:  98% 98% 98%  Weight:      Height:        Intake/Output Summary (Last 24 hours) at 11/08/2020 1435 Last data filed at 11/08/2020 1200 Gross per 24 hour  Intake 243 ml  Output 1100 ml  Net -857 ml   Filed Weights   11/06/20 0500 11/07/20 0348 11/07/20 1050  Weight: 71.1 kg 71.2 kg 71.2 kg    Examination:  General exam: awake, alert, NAD, Appears calm and comfortable  Respiratory system: Clear to auscultation. Respiratory effort normal. Cardiovascular system: normal S1 & S2 heard.  Gastrointestinal system: Abdomen is nondistended, soft and nontender.  Central nervous system: Alert and awake Extremities: No edema Skin: No rashes, lesions or ulcers Psychiatry: Flat affect unchanged fecal bag with large amount of liquid stool output  Data Reviewed: I have personally reviewed following labs and imaging studies  CBC: Recent Labs  Lab 11/02/20 1209 11/03/20 0416 11/04/20 0702 11/05/20 0500 11/07/20 1047 11/08/20 0510  WBC 10.3 5.2 5.8 9.8 8.8 6.6  NEUTROABS 8.4*  --   --   --   --   --   HGB 8.6* 8.0* 8.0* 8.1* 8.0* 7.5*  HCT 27.5* 25.2* 26.1* 25.4* 25.7* 23.9*  MCV 92.0 92.0 93.9 92.7 93.5 92.6  PLT 214 196 171 182 152 664*   Basic Metabolic Panel: Recent Labs  Lab 11/03/20 0416 11/04/20 0702 11/05/20 0500 11/07/20 0500 11/08/20 0510  NA 131* 130* 128* 130* 131*  K 3.5 3.5 3.1* 3.3* 3.4*  CL 99 99 97* 98 99  CO2 24 24 23 24 24   GLUCOSE 90 265* 235* 222* 185*  BUN 50* 46* 55* 42* 30*  CREATININE 3.22* 2.90* 3.50* 3.09* 2.57*  CALCIUM 7.6* 7.5* 7.3* 7.3* 7.1*  MG 2.2  --  1.7 1.6*  --   PHOS  --    --   --   --  2.2*   GFR: Estimated Creatinine Clearance: 26.9 mL/min (A) (by C-G formula based on SCr of 2.57 mg/dL (H)). Liver Function Tests: Recent Labs  Lab 11/02/20 1209 11/03/20 0416 11/08/20 0510  AST 15 11*  --   ALT 14 12  --   ALKPHOS 46 47  --   BILITOT 0.5 0.6  --   PROT 4.5* 4.4*  --   ALBUMIN 1.9* 1.9* 2.0*   No results for input(s): LIPASE, AMYLASE in the last 168 hours. No results for input(s): AMMONIA in the last 168 hours. Coagulation Profile: No results for input(s): INR, PROTIME in the last 168 hours. Cardiac Enzymes: No results for input(s): CKTOTAL, CKMB, CKMBINDEX, TROPONINI in the last 168 hours. BNP (last 3 results) No results for input(s): PROBNP in the last 8760 hours. HbA1C: No results for input(s): HGBA1C in the last 72 hours. CBG: No results for input(s): GLUCAP in the last 168 hours. Lipid Profile: No results for input(s): CHOL, HDL, LDLCALC, TRIG, CHOLHDL, LDLDIRECT in the last 72 hours. Thyroid Function Tests: No results for input(s): TSH, T4TOTAL, FREET4, T3FREE, THYROIDAB in the last 72 hours. Anemia Panel: No results for input(s): VITAMINB12, FOLATE, FERRITIN, TIBC, IRON, RETICCTPCT in the last 72 hours. Sepsis Labs: Recent Labs  Lab 11/02/20 1357  LATICACIDVEN 2.6*    Recent Results (from the past 240 hour(s))  Resp Panel by RT-PCR (Flu A&B, Covid) Nasopharyngeal Swab     Status: None   Collection Time: 10/29/20  3:03 PM   Specimen: Nasopharyngeal Swab; Nasopharyngeal(NP) swabs in vial transport medium  Result Value Ref Range Status   SARS Coronavirus 2 by RT PCR NEGATIVE NEGATIVE Final    Comment: (NOTE) SARS-CoV-2 target nucleic acids are NOT DETECTED.  The SARS-CoV-2 RNA is generally detectable in upper respiratory specimens during the acute phase of infection. The lowest concentration of SARS-CoV-2 viral copies this assay can detect is 138 copies/mL. A negative result does not preclude SARS-Cov-2 infection and should not  be used as the sole basis for treatment or other patient management decisions. A negative result may occur with  improper specimen collection/handling, submission of specimen other than nasopharyngeal swab, presence of viral mutation(s) within the areas targeted by this assay, and inadequate number of viral copies(<138 copies/mL). A negative result must be combined with clinical observations, patient history, and epidemiological information. The expected result is Negative.  Fact Sheet for Patients:  EntrepreneurPulse.com.au  Fact Sheet for Healthcare Providers:  IncredibleEmployment.be  This test is no t yet approved or cleared by the [place] FDA and  has been authorized for detection and/or diagnosis of SARS-CoV-2 by FDA under an Emergency Use Authorization (EUA). This EUA will remain  in effect (meaning this test can be used) for the duration of the COVID-19 declaration under Section 564(b)(1) of the Act, 21 U.S.C.section 360bbb-3(b)(1), unless the authorization is terminated  or revoked sooner.       Influenza A by PCR NEGATIVE NEGATIVE Final   Influenza B by PCR NEGATIVE NEGATIVE Final    Comment: (NOTE) The Xpert Xpress SARS-CoV-2/FLU/RSV plus assay is intended as an aid in the diagnosis of influenza from Nasopharyngeal swab specimens and should not be used as a sole basis for treatment. Nasal washings and aspirates are unacceptable for Xpert Xpress SARS-CoV-2/FLU/RSV testing.  Fact Sheet for Patients: EntrepreneurPulse.com.au  Fact Sheet for Healthcare Providers: IncredibleEmployment.be  This test is not yet approved or cleared by the [place] FDA and has been authorized for detection and/or diagnosis of SARS-CoV-2 by FDA under an Emergency Use Authorization (EUA). This EUA will remain in effect (meaning this test can be used) for the duration of the COVID-19 declaration under Section  564(b)(1) of the Act, 21 U.S.C. section 360bbb-3(b)(1), unless the authorization is terminated or revoked.  Performed at [facility], [address] [place], [place] [address]   SARS Coronavirus 2 by RT PCR (hospital order, performed in [facility] hospital lab) Nasopharyngeal Nasopharyngeal Swab     Status: None   Collection Time: 11/01/20  9:11 AM   Specimen: Nasopharyngeal Swab  Result Value Ref Range Status   SARS Coronavirus 2 NEGATIVE NEGATIVE Final    Comment: (NOTE) SARS-CoV-2 target nucleic acids are NOT DETECTED.  The SARS-CoV-2 RNA is generally detectable in upper and lower respiratory specimens during the acute phase of infection. The lowest concentration of SARS-CoV-2 viral copies this assay can detect is 250 copies / mL. A negative result does not preclude SARS-CoV-2 infection and should not be used as the sole basis for treatment or other patient management decisions.  A negative result may occur with improper specimen collection / handling, submission of specimen other than nasopharyngeal swab, presence of viral mutation(s) within the areas targeted by this assay, and inadequate number of viral copies (<250 copies / mL). A  negative result must be combined with clinical observations, patient history, and epidemiological information.  Fact Sheet for Patients:   StrictlyIdeas.no  Fact Sheet for Healthcare Providers: BankingDealers.co.za  This test is not yet approved or  cleared by the [place] FDA and has been authorized for detection and/or diagnosis of SARS-CoV-2 by FDA under an Emergency Use Authorization (EUA).  This EUA will remain in effect (meaning this test can be used) for the duration of the COVID-19 declaration under Section 564(b)(1) of the Act, 21 U.S.C. section 360bbb-3(b)(1), unless the authorization is terminated or revoked sooner.  Performed at [facility], [address] [place], [place]  [address]   MRSA PCR Screening     Status: None   Collection Time: 11/02/20  2:48 PM   Specimen: Nasopharyngeal  Result Value Ref Range Status   MRSA by PCR NEGATIVE NEGATIVE Final    Comment:        The GeneXpert MRSA Assay (FDA approved for NASAL specimens only), is one component of a comprehensive MRSA colonization surveillance program. It is not intended to diagnose MRSA infection nor to guide or monitor treatment for MRSA infections. Performed at [facility], [address], [place], [place] [address]   C Difficile Quick Screen w PCR reflex     Status: None   Collection Time: 11/02/20  3:20 PM   Specimen: STOOL  Result Value Ref Range Status   C Diff antigen NEGATIVE NEGATIVE Final   C Diff toxin NEGATIVE NEGATIVE Final   C Diff interpretation No C. difficile detected.  Final    Comment: Performed at [facility], [address] [place], [place] [address]  Resp Panel by RT-PCR (Flu A&B, Covid) Nasopharyngeal Swab     Status: None   Collection Time: 11/05/20 10:41 AM   Specimen: Nasopharyngeal Swab; Nasopharyngeal(NP) swabs in vial transport medium  Result Value Ref Range Status   SARS Coronavirus 2 by RT PCR NEGATIVE NEGATIVE Final    Comment: (NOTE) SARS-CoV-2 target nucleic acids are NOT DETECTED.  The SARS-CoV-2 RNA is generally detectable in upper respiratory specimens during the acute phase of infection. The lowest concentration of SARS-CoV-2 viral copies this assay can detect is 138 copies/mL. A negative result does not preclude SARS-Cov-2 infection and should not be used as the sole basis for treatment or other patient management decisions. A negative result may occur with  improper specimen collection/handling, submission of specimen other than nasopharyngeal swab, presence of viral mutation(s) within the areas targeted by this assay, and inadequate number of viral copies(<138 copies/mL). A negative result must be combined with clinical observations, patient  history, and epidemiological information. The expected result is Negative.  Fact Sheet for Patients:  EntrepreneurPulse.com.au  Fact Sheet for Healthcare Providers:  IncredibleEmployment.be  This test is no t yet approved or cleared by the [place] FDA and  has been authorized for detection and/or diagnosis of SARS-CoV-2 by FDA under an Emergency Use Authorization (EUA). This EUA will remain  in effect (meaning this test can be used) for the duration of the COVID-19 declaration under Section 564(b)(1) of the Act, 21 U.S.C.section 360bbb-3(b)(1), unless the authorization is terminated  or revoked sooner.       Influenza A by PCR NEGATIVE NEGATIVE Final   Influenza B by PCR NEGATIVE NEGATIVE Final    Comment: (NOTE) The Xpert Xpress SARS-CoV-2/FLU/RSV plus assay is intended as an aid in the diagnosis of influenza from Nasopharyngeal swab specimens and should not be used as a sole basis for  treatment. Nasal washings and aspirates are unacceptable for Xpert Xpress SARS-CoV-2/FLU/RSV testing.  Fact Sheet for Patients: EntrepreneurPulse.com.au  Fact Sheet for Healthcare Providers: IncredibleEmployment.be  This test is not yet approved or cleared by the [place] FDA and has been authorized for detection and/or diagnosis of SARS-CoV-2 by FDA under an Emergency Use Authorization (EUA). This EUA will remain in effect (meaning this test can be used) for the duration of the COVID-19 declaration under Section 564(b)(1) of the Act, 21 U.S.C. section 360bbb-3(b)(1), unless the authorization is terminated or revoked.  Performed at [facility], [address], [place], [place] [address]   OVA + PARASITE EXAM     Status: None   Collection Time: 11/05/20  3:44 PM   Specimen: Per Rectum; Stool  Result Value Ref Range Status   OVA + PARASITE EXAM Final report  Final    Comment: (NOTE) These results were  obtained using wet preparation(s) and trichrome stained smear. This test does not include testing for Cryptosporidium parvum, Cyclospora, or Microsporidia. Performed At: [facility] [place], [place] [id number] Truddie Coco MD WN:[id number]    Source of Sample STOOL  Final    Comment: Performed at [facility], [address], [place], [place] [address]  Gastrointestinal Panel by PCR , Stool     Status: None   Collection Time: 11/06/20  1:25 PM   Specimen: Stool  Result Value Ref Range Status   Campylobacter species NOT DETECTED NOT DETECTED Final   Plesimonas shigelloides NOT DETECTED NOT DETECTED Final   Salmonella species NOT DETECTED NOT DETECTED Final   Yersinia enterocolitica NOT DETECTED NOT DETECTED Final   Vibrio species NOT DETECTED NOT DETECTED Final   Vibrio cholerae NOT DETECTED NOT DETECTED Final   Enteroaggregative E coli (EAEC) NOT DETECTED NOT DETECTED Final   Enteropathogenic E coli (EPEC) NOT DETECTED NOT DETECTED Final   Enterotoxigenic E coli (ETEC) NOT DETECTED NOT DETECTED Final   Shiga like toxin producing E coli (STEC) NOT DETECTED NOT DETECTED Final   Shigella/Enteroinvasive E coli (EIEC) NOT DETECTED NOT DETECTED Final   Cryptosporidium NOT DETECTED NOT DETECTED Final   Cyclospora cayetanensis NOT DETECTED NOT DETECTED Final   Entamoeba histolytica NOT DETECTED NOT DETECTED Final   Giardia lamblia NOT DETECTED NOT DETECTED Final   Adenovirus F40/41 NOT DETECTED NOT DETECTED Final   Astrovirus NOT DETECTED NOT DETECTED Final   Norovirus GI/GII NOT DETECTED NOT DETECTED Final   Rotavirus A NOT DETECTED NOT DETECTED Final   Sapovirus (I, II, IV, and V) NOT DETECTED NOT DETECTED Final    Comment: Performed at [facility], [address] [place], [place] [address]    Radiology Studies: CT Angio Abd/Pel w/ and/or w/o  Result Date: 11/07/2020 CLINICAL DATA:  Evaluate for mesenteric ischemia. End-stage renal disease on  dialysis. Bowel wall thickening on exam from 10/12/2020. EXAM: CTA ABDOMEN AND PELVIS WITHOUT AND WITH CONTRAST TECHNIQUE: Multidetector CT imaging of the abdomen and pelvis was performed using the standard protocol during bolus administration of intravenous contrast. Multiplanar reconstructed images and MIPs were obtained and reviewed to evaluate the vascular anatomy. CONTRAST:  51mL OMNIPAQUE IOHEXOL 350 MG/ML SOLN COMPARISON:  10/22/2020 FINDINGS: VASCULAR Aorta: Mild atherosclerotic disease in the abdominal aorta without aneurysm or dissection. Celiac: Celiac trunk is patent without significant stenosis. Focal dilatation of the trunk measures up to 9 mm. Main branch vessels are patent. SMA: Replaced right hepatic artery. SMA is widely patent without significant stenosis. No evidence for  aneurysm or dissection. Renals: Main right renal artery is widely patent without stenosis, aneurysm or dissection. There is small accessory right renal artery. High-grade focal narrowing in the proximal main left renal artery without significant plaque in this area. Findings could be related to a dissection but indeterminate. Small accessory left renal artery is patent. IMA: Patent Inflow: Atherosclerotic disease involving the proximal left common iliac artery without significant stenosis. Common iliac arteries are patent bilaterally. Disease and stenosis involving the right internal iliac artery. Left internal iliac artery is patent. Bilateral external iliac arteries are widely patent. Proximal Outflow: Proximal femoral arteries are patent bilaterally. Veins: Main portal venous system is patent. Limited evaluation of the superior cavoatrial junction on the arterial phase imaging due to non-opacified blood in this area. IVC and renal veins are patent. Narrowing of the left common iliac vein from the right common iliac artery is a normal anatomic variant. Review of the MIP images confirms the above findings. NON-VASCULAR Lower  chest: Small bilateral pleural effusions with compressive atelectasis. Hepatobiliary: High-density material in the gallbladder are suggestive for small stones. The gallbladder is moderately distended without definite inflammatory changes. There is colon anterior to the liver. No discrete liver lesion. No biliary dilatation. Gallbladder distension has minimally changed since the previous examination. Pancreas: Unremarkable. No pancreatic ductal dilatation or surrounding inflammatory changes. Spleen: Normal in size without focal abnormality. Adrenals/Urinary Tract: Normal appearance of the adrenal glands. Both kidneys are small with scattered calcifications. Findings are suggestive for nonobstructive renal calculi. Stomach/Bowel: Normal appearance of the stomach and duodenum. There is a rectal catheter with an inflated balloon. Sigmoid colon is moderately distended with mild wall thickening. However, the sigmoid wall thickening is less impressive on the venous phase imaging. Cecum is distended and contains a large amount of stool. Transverse colon is distended with gas. Lymphatic: No significant lymph node enlargement in the abdomen or pelvis. Reproductive: Stable appearance of the prostate. Other: Diffuse subcutaneous edema. Small amount of pelvic ascites which is new. Trace ascites in the left lower quadrant of the abdomen. Trace ascites in the upper abdomen. Negative for free air. Musculoskeletal: Chronic disc space narrowing with endplate changes at Z6-X0. Stable disc space narrowing at L5-S1. IMPRESSION: VASCULAR 1. Atherosclerotic disease in the abdominal aorta without aneurysm or significant stenosis. Aortic Atherosclerosis (ICD10-I70.0). 2. Main mesenteric arteries are patent without significant stenosis. No evidence to suggest chronic mesenteric ischemia. 3. Multiple renal arteries as described. Focal narrowing the proximal left main renal artery that could be related to atherosclerotic disease or focal  dissection in this area. NON-VASCULAR 1. The sigmoid colon has been partially decompressed since 10/22/2020 and placement of the rectal tube. There continues to be gaseous distension of the sigmoid colon and transverse colon. Mild wall thickening in the sigmoid colon is nonspecific but may be related to the partial decompression rather than infectious or inflammatory process. Findings are suggestive for an underlying colonic ileus. 2. Bilateral kidneys are atrophic with bilateral calcifications. Findings are compatible with history of end-stage renal disease. Calcifications could represent nonobstructive renal calculi. 3. Diffuse subcutaneous edema with small amount of ascites, most prominent in the pelvis. 4. Significant disc space disease at L2-L3. 5. Bilateral pleural effusions with compressive atelectasis at the lung bases. 6. Cholelithiasis with moderate gallbladder distension. Electronically Signed   By: Markus Daft M.D.   On: 11/07/2020 17:58   Scheduled Meds: . (feeding supplement) PROSource Plus  30 mL Oral BID BM  . amiodarone  200 mg Oral Daily  .  Chlorhexidine Gluconate Cloth  6 each Topical Q0600  . Chlorhexidine Gluconate Cloth  6 each Topical Q0600  . colestipol  2 g Oral Daily  . darbepoetin (ARANESP) injection - DIALYSIS  100 mcg Intravenous Q Wed-HD  . famotidine  20 mg Oral Daily  . feeding supplement  237 mL Oral BID BM  . fludrocortisone  0.1 mg Oral Daily  . lipase/protease/amylase  36,000 Units Oral With snacks  . lipase/protease/amylase  72,000 Units Oral TID WC  . loperamide  2 mg Oral TID  . metoprolol tartrate  12.5 mg Oral BID  . midodrine  10 mg Oral TID with meals  . multivitamin  1 tablet Oral QHS  . predniSONE  20 mg Oral Q breakfast   Followed by  . [START ON 11/13/2020] predniSONE  10 mg Oral Q breakfast  . simvastatin  20 mg Oral QPM  . sodium chloride flush  3 mL Intravenous Q12H   Continuous Infusions: . sodium chloride    . sodium chloride    . sodium  chloride       LOS: 6 days   Time spent: 35 minutes  Ivie Maese Wynetta Emery, MD How to contact the [facility] Attending or Consulting provider [place] or covering provider during after hours [place], for this patient?  1. Check the care team in [facility] and look for a) attending/consulting [facility] provider listed and b) the [facility] team listed 2. Log into [email or web address] and use [facility] universal password to access. If you do not have the password, please contact the hospital operator. 3. Locate the [facility] provider you are looking for under [facility] and page to a number that you can be directly reached. 4. If you still have difficulty reaching the provider, please page the [facility] (Director on Call) for the Hospitalists listed on amion for assistance.   If 7PM-7AM, please contact night-coverage [email or web address] 11/08/2020, 2:35 PM

## 2020-11-08 NOTE — Progress Notes (Signed)
Patient ID: SPIROS GREENFELD, male   DOB: 08/14/1951, 69 y.o.   MRN: [id number]   S:    No interval events  CTA w/o e/o mesenteric ischemia  Had some ascites, plerual effusion, edema noted  No orthostatics this AM  O:BP (!) 145/72 (BP Location: Left Arm)   Pulse 76   Temp 98.7 F (37.1 C) (Oral)   Resp 18   Ht 6\' 2"  (1.88 m)   Wt 71.2 kg   SpO2 98%   BMI 20.15 kg/m   Intake/Output Summary (Last 24 hours) at 11/08/2020 1119 Last data filed at 11/08/2020 0914 Gross per 24 hour  Intake 363 ml  Output 900 ml  Net -537 ml   Intake/Output:   I/O last 3 completed shifts: In: 363 [P.O.:360; I.V.:3] Out: 1550 [Urine:1000; Stool:550]  Intake/Output this shift:  Total I/O In: 3 [I.V.:3] Out: -  Weight change: 0 kg   NAD RIJ TDC No sig LEE S/nt Regular CTAB Nonfocl EOMI NCAT   Recent Labs  Lab 11/02/20 1209 11/03/20 0416 11/04/20 0702 11/05/20 0500 11/07/20 0500 11/08/20 0510  NA 132* 131* 130* 128* 130* 131*  K 2.9* 3.5 3.5 3.1* 3.3* 3.4*  CL 99 99 99 97* 98 99  CO2 23 24 24 23 24 24   GLUCOSE 157* 90 265* 235* 222* 185*  BUN 46* 50* 46* 55* 42* 30*  CREATININE 2.92* 3.22* 2.90* 3.50* 3.09* 2.57*  ALBUMIN 1.9* 1.9*  --   --   --  2.0*  CALCIUM 7.4* 7.6* 7.5* 7.3* 7.3* 7.1*  PHOS  --   --   --   --   --  2.2*  AST 15 11*  --   --   --   --   ALT 14 12  --   --   --   --    Liver Function Tests: Recent Labs  Lab 11/02/20 1209 11/03/20 0416 11/08/20 0510  AST 15 11*  --   ALT 14 12  --   ALKPHOS 46 47  --   BILITOT 0.5 0.6  --   PROT 4.5* 4.4*  --   ALBUMIN 1.9* 1.9* 2.0*   No results for input(s): LIPASE, AMYLASE in the last 168 hours. No results for input(s): AMMONIA in the last 168 hours. CBC: Recent Labs  Lab 11/02/20 1209 11/03/20 0416 11/04/20 0702 11/05/20 0500 11/07/20 1047 11/08/20 0510  WBC 10.3 5.2 5.8 9.8 8.8 6.6  NEUTROABS 8.4*  --   --   --   --   --   HGB 8.6* 8.0* 8.0* 8.1* 8.0* 7.5*  HCT 27.5* 25.2* 26.1* 25.4* 25.7* 23.9*   MCV 92.0 92.0 93.9 92.7 93.5 92.6  PLT 214 196 171 182 152 147*   Cardiac Enzymes: No results for input(s): CKTOTAL, CKMB, CKMBINDEX, TROPONINI in the last 168 hours. CBG: No results for input(s): GLUCAP in the last 168 hours.  Iron Studies: No results for input(s): IRON, TIBC, TRANSFERRIN, FERRITIN in the last 72 hours. Studies/Results: CT Angio Abd/Pel w/ and/or w/o  Result Date: 11/07/2020 CLINICAL DATA:  Evaluate for mesenteric ischemia. End-stage renal disease on dialysis. Bowel wall thickening on exam from 10/12/2020. EXAM: CTA ABDOMEN AND PELVIS WITHOUT AND WITH CONTRAST TECHNIQUE: Multidetector CT imaging of the abdomen and pelvis was performed using the standard protocol during bolus administration of intravenous contrast. Multiplanar reconstructed images and MIPs were obtained and reviewed to evaluate the vascular anatomy. CONTRAST:  54mL OMNIPAQUE IOHEXOL 350 MG/ML SOLN COMPARISON:  10/22/2020 FINDINGS:  VASCULAR Aorta: Mild atherosclerotic disease in the abdominal aorta without aneurysm or dissection. Celiac: Celiac trunk is patent without significant stenosis. Focal dilatation of the trunk measures up to 9 mm. Main branch vessels are patent. SMA: Replaced right hepatic artery. SMA is widely patent without significant stenosis. No evidence for aneurysm or dissection. Renals: Main right renal artery is widely patent without stenosis, aneurysm or dissection. There is small accessory right renal artery. High-grade focal narrowing in the proximal main left renal artery without significant plaque in this area. Findings could be related to a dissection but indeterminate. Small accessory left renal artery is patent. IMA: Patent Inflow: Atherosclerotic disease involving the proximal left common iliac artery without significant stenosis. Common iliac arteries are patent bilaterally. Disease and stenosis involving the right internal iliac artery. Left internal iliac artery is patent. Bilateral  external iliac arteries are widely patent. Proximal Outflow: Proximal femoral arteries are patent bilaterally. Veins: Main portal venous system is patent. Limited evaluation of the superior cavoatrial junction on the arterial phase imaging due to non-opacified blood in this area. IVC and renal veins are patent. Narrowing of the left common iliac vein from the right common iliac artery is a normal anatomic variant. Review of the MIP images confirms the above findings. NON-VASCULAR Lower chest: Small bilateral pleural effusions with compressive atelectasis. Hepatobiliary: High-density material in the gallbladder are suggestive for small stones. The gallbladder is moderately distended without definite inflammatory changes. There is colon anterior to the liver. No discrete liver lesion. No biliary dilatation. Gallbladder distension has minimally changed since the previous examination. Pancreas: Unremarkable. No pancreatic ductal dilatation or surrounding inflammatory changes. Spleen: Normal in size without focal abnormality. Adrenals/Urinary Tract: Normal appearance of the adrenal glands. Both kidneys are small with scattered calcifications. Findings are suggestive for nonobstructive renal calculi. Stomach/Bowel: Normal appearance of the stomach and duodenum. There is a rectal catheter with an inflated balloon. Sigmoid colon is moderately distended with mild wall thickening. However, the sigmoid wall thickening is less impressive on the venous phase imaging. Cecum is distended and contains a large amount of stool. Transverse colon is distended with gas. Lymphatic: No significant lymph node enlargement in the abdomen or pelvis. Reproductive: Stable appearance of the prostate. Other: Diffuse subcutaneous edema. Small amount of pelvic ascites which is new. Trace ascites in the left lower quadrant of the abdomen. Trace ascites in the upper abdomen. Negative for free air. Musculoskeletal: Chronic disc space narrowing with  endplate changes at P2-R5. Stable disc space narrowing at L5-S1. IMPRESSION: VASCULAR 1. Atherosclerotic disease in the abdominal aorta without aneurysm or significant stenosis. Aortic Atherosclerosis (ICD10-I70.0). 2. Main mesenteric arteries are patent without significant stenosis. No evidence to suggest chronic mesenteric ischemia. 3. Multiple renal arteries as described. Focal narrowing the proximal left main renal artery that could be related to atherosclerotic disease or focal dissection in this area. NON-VASCULAR 1. The sigmoid colon has been partially decompressed since 10/22/2020 and placement of the rectal tube. There continues to be gaseous distension of the sigmoid colon and transverse colon. Mild wall thickening in the sigmoid colon is nonspecific but may be related to the partial decompression rather than infectious or inflammatory process. Findings are suggestive for an underlying colonic ileus. 2. Bilateral kidneys are atrophic with bilateral calcifications. Findings are compatible with history of end-stage renal disease. Calcifications could represent nonobstructive renal calculi. 3. Diffuse subcutaneous edema with small amount of ascites, most prominent in the pelvis. 4. Significant disc space disease at L2-L3. 5. Bilateral pleural effusions with  compressive atelectasis at the lung bases. 6. Cholelithiasis with moderate gallbladder distension. Electronically Signed   By: Markus Daft M.D.   On: 11/07/2020 17:58   . (feeding supplement) PROSource Plus  30 mL Oral BID BM  . amiodarone  200 mg Oral Daily  . Chlorhexidine Gluconate Cloth  6 each Topical Q0600  . Chlorhexidine Gluconate Cloth  6 each Topical Q0600  . colestipol  2 g Oral Daily  . darbepoetin (ARANESP) injection - DIALYSIS  100 mcg Intravenous Q Wed-HD  . famotidine  20 mg Oral Daily  . feeding supplement  237 mL Oral BID BM  . fludrocortisone  0.1 mg Oral Daily  . lipase/protease/amylase  36,000 Units Oral With snacks  .  lipase/protease/amylase  72,000 Units Oral TID WC  . loperamide  2 mg Oral TID  . metoprolol tartrate  12.5 mg Oral BID  . midodrine  10 mg Oral TID with meals  . multivitamin  1 tablet Oral QHS  . predniSONE  20 mg Oral Q breakfast   Followed by  . [START ON 11/13/2020] predniSONE  10 mg Oral Q breakfast  . simvastatin  20 mg Oral QPM  . sodium chloride flush  3 mL Intravenous Q12H    Assessment/Plan:  1. Recurrent Syncope/hypotension; sig orthostasis:On TID Midodrine + Florinef + compression stockings. HD with minimal UF, prob exacerbated by #2, #6, #9 1. Keep even with HD--> sig UOP of > 1 L daily as well as copious stool output. 2. Colitis- GI following s/p ABX. on pred taper. Still with diarrhea-   3. [facility]. HD today on schedule: no UF, no heparin, 3.5h, 3K 4. Anemia:of CKD- No heparin with HD. ESA with HD qWed while inpatient.  Repeat Fe panel 5. CKD-MBD:continue with home meds.Calcitriol 0.105mcg daily (was on this as an outpatient). Calcium corrects; Phos low no binders encourage PO 6. PCM / Nutrition:per primary, push protein-  Last phos was low, no binder 7. H/o Hypertension:see #1-  Will likely be one of those patients who do not need much fluid removed at all with HD 8. Vascular access- will need to f/u with Dr. Donnetta Hutching for AVF once stable for discharge. 9. A fib- on amio and MTP, AC per cardiology off apixaban 10. Hyponatremia. Monitor, mild, ASx stable 11. Disposition- SNF   Rexene Agent  MD [facility]

## 2020-11-08 NOTE — Progress Notes (Signed)
   11/08/20 1726  Orthostatic Lying   BP- Lying 172/87  Pulse- Lying 64  Orthostatic Sitting  BP- Sitting (!) 150/96  Pulse- Sitting 75  Orthostatic Standing at 0 minutes  BP- Standing at 0 minutes 122/70  Pulse- Standing at 0 minutes 94  Orthostatic Standing at 3 minutes  BP- Standing at 3 minutes 122/70  Pulse- Standing at 3 minutes 97

## 2020-11-08 NOTE — Progress Notes (Signed)
Subjective:  No abdominal pain, n/v.  Rectal tube in place.  Complains of a lot of gas.  Tolerating meals.  Objective: Vital signs in last 24 hours: Temp:  [98 F (36.7 C)-98.8 F (37.1 C)] 98.7 F (37.1 C) (12/09 0636) Pulse Rate:  [65-84] 76 (12/09 0636) Resp:  [18-20] 18 (12/09 0636) BP: (124-156)/(70-90) 145/72 (12/09 0636) SpO2:  [98 %] 98 % (12/09 0636) Last BM Date: 11/07/20 General:   Alert,  Well-developed, well-nourished, pleasant and cooperative in NAD Head:  Normocephalic and atraumatic. Eyes:  Sclera clear, no icterus.  Abdomen:  Soft, nontender and nondistended.  Normal bowel sounds, without guarding, and without rebound.   Rectal tube: 100 cc brown liquid/soft stool Extremities:  Without clubbing, deformity or edema. Neurologic:  Alert and  oriented x4;  grossly normal neurologically. Skin:  Intact without significant lesions or rashes. Psych:  Alert and cooperative. Normal mood and affect.  Intake/Output from previous day: 12/08 0701 - 12/09 0700 In: 363 [P.O.:360; I.V.:3] Out: 900 [Urine:600; Stool:300] Intake/Output this shift: Total I/O In: 3 [I.V.:3] Out: -   Lab Results: CBC Recent Labs    11/07/20 1047 11/08/20 0510  WBC 8.8 6.6  HGB 8.0* 7.5*  HCT 25.7* 23.9*  MCV 93.5 92.6  PLT 152 147*   BMET Recent Labs    11/07/20 0500 11/08/20 0510  NA 130* 131*  K 3.3* 3.4*  CL 98 99  CO2 24 24  GLUCOSE 222* 185*  BUN 42* 30*  CREATININE 3.09* 2.57*  CALCIUM 7.3* 7.1*   LFTs Recent Labs    11/08/20 0510  ALBUMIN 2.0*   No results for input(s): LIPASE in the last 72 hours. PT/INR No results for input(s): LABPROT, INR in the last 72 hours.    Imaging Studies: CT ABDOMEN PELVIS WO CONTRAST  Result Date: 10/23/2020 CLINICAL DATA:  Abdominal distension with gaseous distention of bowel on abdominal radiographs. Elevated creatinine. EXAM: CT ABDOMEN AND PELVIS WITHOUT CONTRAST TECHNIQUE: Multidetector CT imaging of the abdomen and pelvis  was performed following the standard protocol without IV contrast. COMPARISON:  10/12/2020 FINDINGS: Lower chest: Small bilateral pleural effusions with bilateral basilar atelectasis. Effusions are increasing in size since previous study. Fluid in the distal esophagus suggesting reflux. Hepatobiliary: Normal appearance of the liver. The gallbladder is distended with multiple small stones present. No bile duct dilatation. Pancreas: Unremarkable. No pancreatic ductal dilatation or surrounding inflammatory changes. Spleen: Normal in size without focal abnormality. Adrenals/Urinary Tract: No adrenal gland nodules. Renal parenchyma is atrophic bilaterally. Multiple renal parenchymal calcifications, largest on the left measuring 5 mm in diameter. No hydronephrosis or hydroureter. No ureteral stones are identified. The bladder is normal. Stomach/Bowel: There is prominent diffuse distention of the colon throughout. Stool is demonstrated in the right hemicolon. Small amount of fluid in the descending colon. Rectal contrast material is present with a rectal tube in place. No wall thickening or inflammatory changes. The small bowel are decompressed. Moderately distended stomach with air-fluid levels. Vascular/Lymphatic: Aortic atherosclerosis. No enlarged abdominal or pelvic lymph nodes. Reproductive: Prostate is unremarkable. Other: No free air or free fluid in the abdomen. Abdominal wall musculature appears intact. Musculoskeletal: Degenerative changes in the spine and hips. No destructive bone lesions. IMPRESSION: 1. Prominent diffuse distention of the colon throughout with air-fluid levels. No evidence of small bowel obstruction. No inflammatory changes or obstructing lesion identified. Changes likely to represent adynamic ileus. 2. Small bilateral pleural effusions with bilateral basilar atelectasis. Effusions are increasing in size since previous study. 3.  Fluid in the distal esophagus suggesting reflux. 4.  Cholelithiasis with distended gallbladder. 5. Bilateral renal atrophy with multiple renal parenchymal calcifications. No hydronephrosis or hydroureter. 6. Aortic atherosclerosis. Aortic Atherosclerosis (ICD10-I70.0). Electronically Signed   By: Lucienne Capers M.D.   On: 10/23/2020 00:15   CT Abdomen Pelvis Wo Contrast  Result Date: 10/12/2020 CLINICAL DATA:  15 pound weight loss in 2 weeks. Nausea and vomiting. EXAM: CT ABDOMEN AND PELVIS WITHOUT CONTRAST TECHNIQUE: Multidetector CT imaging of the abdomen and pelvis was performed following the standard protocol without IV contrast. COMPARISON:  06/13/2018 FINDINGS: Lower chest: Small bilateral pleural effusions. Contrast medium in the distal esophagus suggesting dysmotility or reflux. Low-density blood pool suggests anemia. Hepatobiliary: Dependent gallstones in the gallbladder. Trace perihepatic ascites. Pancreas: Punctate calcifications in the pancreas likely reflecting chronic calcific pancreatitis. Spleen: Unremarkable Adrenals/Urinary Tract: There about 8 nonobstructive right renal calculi measuring up to 0.5 cm in diameter. There are about 13 nonobstructive left renal calculi measuring up to 0.6 cm in diameter. No hydronephrosis, hydroureter, or ureteral calculus identified. Adrenal glands unremarkable. Stomach/Bowel: Rectal wall thickening noted with redundant dilated sigmoid colon extending up into the upper abdomen, but without overt volvulus. There is wall thickening in the descending colon. Gas-filled dilated transverse colon. Mild segmental wall thickening in the ascending colon. There is edema in the sigmoid colon mesentery and to a lesser extent in the central bowel mesentery normal appendix. No pneumatosis. Vascular/Lymphatic: Aortoiliac atherosclerotic vascular disease. Reproductive: Mild prostatomegaly. Other: Presacral edema. Mild scattered ascites. Mesenteric edema especially along the sigmoid mesentery. Musculoskeletal: Degenerative disc  disease at L2-3 and L5-S1 with endplate irregularity and endplate sclerosis especially at L2-3. This has worsened compared to 06/13/2018. IMPRESSION: 1. Rectal wall thickening with redundant dilated sigmoid colon extending up into the upper abdomen, but without overt volvulus. There is also wall thickening in the descending colon, ascending colon, and sigmoid colon. There is edema in the sigmoid colon mesentery and to a lesser extent in the central bowel mesentery. The appearance is nonspecific but could be due to colitis, inflammatory bowel disease or inflammatory bowel disease; a component of functional colonic abnormalities not excluded given the moderately dilated appearance. 2. Small bilateral pleural effusions. 3. Low-density blood pool suggests anemia. 4. Cholelithiasis. 5. Bilateral nonobstructive nephrolithiasis. 6. Mild prostatomegaly. 7. Degenerative disc disease at L2-3 and L5-S1 with endplate irregularity and endplate sclerosis especially at L2-3. This has worsened compared to 06/13/2018. 8. Contrast medium in the distal esophagus suggesting dysmotility or reflux. 9. Aortic atherosclerosis. Aortic Atherosclerosis (ICD10-I70.0). Electronically Signed   By: Van Clines M.D.   On: 10/12/2020 19:10   DG Chest 2 View  Result Date: 10/12/2020 CLINICAL DATA:  Near-syncope at dialysis. EXAM: CHEST - 2 VIEW COMPARISON:  09/24/2020 FINDINGS: A new right jugular dialysis catheter terminates over the lower SVC. The cardiomediastinal silhouette is unchanged with normal heart size. There are small pleural effusions with mild posterior basilar airspace opacity. The lungs are otherwise clear. No edema or pneumothorax is identified. No acute osseous abnormality is seen. There is persistent gaseous distension of likely large bowel loops in the upper abdomen, also present on the prior study and incompletely evaluated on these chest radiographs. IMPRESSION: 1. Small bilateral pleural effusions with mild basilar  atelectasis. 2. Persistent gaseous distension of colon in the upper abdomen, incompletely evaluated. Electronically Signed   By: Logan Bores M.D.   On: 10/12/2020 14:39   DG CHEST PORT 1 VIEW  Result Date: 10/29/2020 CLINICAL DATA:  Hypoxia, orthostatic hypotension,  atrial fibrillation EXAM: PORTABLE CHEST 1 VIEW COMPARISON:  10/12/2020 FINDINGS: Single frontal view of the chest demonstrates right internal jugular dialysis catheter unchanged. Cardiac silhouette is stable. Interval development of bibasilar veiling opacities compatible with consolidation and/or effusion. There is central vascular congestion. No pneumothorax. No acute bony abnormalities. IMPRESSION: 1. Findings compatible with fluid overload and bilateral pleural effusions. Electronically Signed   By: Randa Ngo M.D.   On: 10/29/2020 20:00   DG Abd Portable 1V  Result Date: 10/25/2020 CLINICAL DATA:  Abdominal distension. EXAM: PORTABLE ABDOMEN - 1 VIEW COMPARISON:  10/22/2020 FINDINGS: There is marked diffuse gaseous distension of the colon the appearance is not significantly changed when compared with 10/22/2020. A large stool burden is identified within the right colon, unchanged. No new findings. IMPRESSION: 1. No change in marked gaseous distension of the colon compatible with colonic ileus. 2. Unchanged large stool burden within the right colon. Electronically Signed   By: Kerby Moors M.D.   On: 10/25/2020 08:07   DG Abd Portable 2V  Result Date: 10/22/2020 CLINICAL DATA:  Abdominal distension.  History of bowel obstruction. EXAM: PORTABLE ABDOMEN - 2 VIEW COMPARISON:  CT 10/12/2020.  Radiography 09/24/2020. FINDINGS: Gaseous distension of the intestine. Transverse colon measures up to 12 cm in diameter. Moderate stent shin of the small intestine as well, maximal diameter 3.5 cm. Moderate amount of fecal matter in the right colon. No sign of free air. The appearance could be due to pseudo obstruction, ileus or actual distal  colon obstruction. Gaseous distension appears even more pronounced than on the study of 10 days ago. IMPRESSION: Gaseous distension of the intestine, even more pronounced than on the study of 10 days ago. The appearance could be due to pseudo obstruction, ileus or actual distal colon obstruction. Electronically Signed   By: Nelson Chimes M.D.   On: 10/22/2020 13:55   ECHOCARDIOGRAM COMPLETE  Result Date: 10/13/2020    ECHOCARDIOGRAM REPORT   Patient Name:   BURNICE OESTREICHER Date of Exam: 10/13/2020 Medical Rec #:  [id number]   Height:       74.0 in Accession #:    [id number]  Weight:       160.0 lb Date of Birth:  1951/02/19   BSA:          1.977 m Patient Age:    58 years    BP:           133/73 mmHg Patient Gender: M           HR:           74 bpm. Exam Location:  [facility] Procedure: 2D Echo, Cardiac Doppler and Color Doppler Indications:    Syncope  History:        Patient has no prior history of Echocardiogram examinations.                 Arrythmias:Atrial Fibrillation, Signs/Symptoms:Syncope; Risk                 Factors:Dyslipidemia. ESRD.  Sonographer:    Dustin Flock RDCS Referring Phys: [place]  1. Left ventricular ejection fraction, by estimation, is 60 to 65%. The left ventricle has normal function. The left ventricle has no regional wall motion abnormalities. Left ventricular diastolic parameters are consistent with Grade I diastolic dysfunction (impaired relaxation). Elevated left atrial pressure.  2. Right ventricular systolic function is normal. The right ventricular size is normal. There is normal pulmonary artery systolic pressure.  3. The mitral valve is normal in structure. No evidence of mitral valve regurgitation. No evidence of mitral stenosis.  4. The aortic valve is normal in structure. Aortic valve regurgitation is not visualized. No aortic stenosis is present.  5. There is Moderate (Grade III) protruding plaque involving the transverse aorta.  6. The  inferior vena cava is normal in size with greater than 50% respiratory variability, suggesting right atrial pressure of 3 mmHg. FINDINGS  Left Ventricle: Left ventricular ejection fraction, by estimation, is 60 to 65%. The left ventricle has normal function. The left ventricle has no regional wall motion abnormalities. The left ventricular internal cavity size was normal in size. There is  no left ventricular hypertrophy. Left ventricular diastolic parameters are consistent with Grade I diastolic dysfunction (impaired relaxation). Elevated left atrial pressure. Right Ventricle: The right ventricular size is normal. No increase in right ventricular wall thickness. Right ventricular systolic function is normal. There is normal pulmonary artery systolic pressure. The tricuspid regurgitant velocity is 2.55 m/s, and  with an assumed right atrial pressure of 3 mmHg, the estimated right ventricular systolic pressure is 44.9 mmHg. Left Atrium: Left atrial size was normal in size. Right Atrium: Right atrial size was normal in size. Prominent Eustachian valve. Pericardium: There is no evidence of pericardial effusion. Mitral Valve: The mitral valve is normal in structure. No evidence of mitral valve regurgitation. No evidence of mitral valve stenosis. Tricuspid Valve: The tricuspid valve is normal in structure. Tricuspid valve regurgitation is trivial. No evidence of tricuspid stenosis. Aortic Valve: The aortic valve is normal in structure. Aortic valve regurgitation is not visualized. No aortic stenosis is present. Pulmonic Valve: The pulmonic valve was normal in structure. Pulmonic valve regurgitation is not visualized. No evidence of pulmonic stenosis. Aorta: The aortic root is normal in size and structure. There is moderate (Grade III) protruding plaque involving the transverse aorta. Venous: The inferior vena cava is normal in size with greater than 50% respiratory variability, suggesting right atrial pressure of 3 mmHg.  IAS/Shunts: No atrial level shunt detected by color flow Doppler.  LEFT VENTRICLE PLAX 2D LVIDd:         4.96 cm  Diastology LVIDs:         3.66 cm  LV e' medial:    7.29 cm/s LV PW:         1.16 cm  LV E/e' medial:  9.7 LV IVS:        1.17 cm  LV e' lateral:   8.38 cm/s LVOT diam:     2.50 cm  LV E/e' lateral: 8.4 LV SV:         101 LV SV Index:   51 LVOT Area:     4.91 cm  RIGHT VENTRICLE RV Basal diam:  3.10 cm RV S prime:     8.70 cm/s TAPSE (M-mode): 4.2 cm LEFT ATRIUM             Index       RIGHT ATRIUM           Index LA diam:        3.80 cm 1.92 cm/m  RA Area:     19.40 cm LA Vol (A2C):   49.9 ml 25.24 ml/m RA Volume:   53.30 ml  26.96 ml/m LA Vol (A4C):   50.8 ml 25.70 ml/m LA Biplane Vol: 53.0 ml 26.81 ml/m  AORTIC VALVE LVOT Vmax:   99.20 cm/s LVOT Vmean:  67.300 cm/s LVOT VTI:  0.206 m  AORTA Ao Root diam: 3.50 cm MITRAL VALVE               TRICUSPID VALVE MV Area (PHT): 3.66 cm    TR Peak grad:   26.0 mmHg MV Decel Time: 207 msec    TR Vmax:        255.00 cm/s MV E velocity: 70.60 cm/s MV A velocity: 98.50 cm/s  SHUNTS MV E/A ratio:  0.72        Systemic VTI:  0.21 m                            Systemic Diam: 2.50 cm Sanda Klein MD Electronically signed by Sanda Klein MD Signature Date/Time: 10/13/2020/1:20:39 PM    Final    CT Angio Abd/Pel w/ and/or w/o  Result Date: 11/07/2020 CLINICAL DATA:  Evaluate for mesenteric ischemia. End-stage renal disease on dialysis. Bowel wall thickening on exam from 10/12/2020. EXAM: CTA ABDOMEN AND PELVIS WITHOUT AND WITH CONTRAST TECHNIQUE: Multidetector CT imaging of the abdomen and pelvis was performed using the standard protocol during bolus administration of intravenous contrast. Multiplanar reconstructed images and MIPs were obtained and reviewed to evaluate the vascular anatomy. CONTRAST:  67mL OMNIPAQUE IOHEXOL 350 MG/ML SOLN COMPARISON:  10/22/2020 FINDINGS: VASCULAR Aorta: Mild atherosclerotic disease in the abdominal aorta without  aneurysm or dissection. Celiac: Celiac trunk is patent without significant stenosis. Focal dilatation of the trunk measures up to 9 mm. Main branch vessels are patent. SMA: Replaced right hepatic artery. SMA is widely patent without significant stenosis. No evidence for aneurysm or dissection. Renals: Main right renal artery is widely patent without stenosis, aneurysm or dissection. There is small accessory right renal artery. High-grade focal narrowing in the proximal main left renal artery without significant plaque in this area. Findings could be related to a dissection but indeterminate. Small accessory left renal artery is patent. IMA: Patent Inflow: Atherosclerotic disease involving the proximal left common iliac artery without significant stenosis. Common iliac arteries are patent bilaterally. Disease and stenosis involving the right internal iliac artery. Left internal iliac artery is patent. Bilateral external iliac arteries are widely patent. Proximal Outflow: Proximal femoral arteries are patent bilaterally. Veins: Main portal venous system is patent. Limited evaluation of the superior cavoatrial junction on the arterial phase imaging due to non-opacified blood in this area. IVC and renal veins are patent. Narrowing of the left common iliac vein from the right common iliac artery is a normal anatomic variant. Review of the MIP images confirms the above findings. NON-VASCULAR Lower chest: Small bilateral pleural effusions with compressive atelectasis. Hepatobiliary: High-density material in the gallbladder are suggestive for small stones. The gallbladder is moderately distended without definite inflammatory changes. There is colon anterior to the liver. No discrete liver lesion. No biliary dilatation. Gallbladder distension has minimally changed since the previous examination. Pancreas: Unremarkable. No pancreatic ductal dilatation or surrounding inflammatory changes. Spleen: Normal in size without focal  abnormality. Adrenals/Urinary Tract: Normal appearance of the adrenal glands. Both kidneys are small with scattered calcifications. Findings are suggestive for nonobstructive renal calculi. Stomach/Bowel: Normal appearance of the stomach and duodenum. There is a rectal catheter with an inflated balloon. Sigmoid colon is moderately distended with mild wall thickening. However, the sigmoid wall thickening is less impressive on the venous phase imaging. Cecum is distended and contains a large amount of stool. Transverse colon is distended with gas. Lymphatic: No significant lymph node enlargement in the abdomen  or pelvis. Reproductive: Stable appearance of the prostate. Other: Diffuse subcutaneous edema. Small amount of pelvic ascites which is new. Trace ascites in the left lower quadrant of the abdomen. Trace ascites in the upper abdomen. Negative for free air. Musculoskeletal: Chronic disc space narrowing with endplate changes at Y7-C6. Stable disc space narrowing at L5-S1. IMPRESSION: VASCULAR 1. Atherosclerotic disease in the abdominal aorta without aneurysm or significant stenosis. Aortic Atherosclerosis (ICD10-I70.0). 2. Main mesenteric arteries are patent without significant stenosis. No evidence to suggest chronic mesenteric ischemia. 3. Multiple renal arteries as described. Focal narrowing the proximal left main renal artery that could be related to atherosclerotic disease or focal dissection in this area. NON-VASCULAR 1. The sigmoid colon has been partially decompressed since 10/22/2020 and placement of the rectal tube. There continues to be gaseous distension of the sigmoid colon and transverse colon. Mild wall thickening in the sigmoid colon is nonspecific but may be related to the partial decompression rather than infectious or inflammatory process. Findings are suggestive for an underlying colonic ileus. 2. Bilateral kidneys are atrophic with bilateral calcifications. Findings are compatible with history  of end-stage renal disease. Calcifications could represent nonobstructive renal calculi. 3. Diffuse subcutaneous edema with small amount of ascites, most prominent in the pelvis. 4. Significant disc space disease at L2-L3. 5. Bilateral pleural effusions with compressive atelectasis at the lung bases. 6. Cholelithiasis with moderate gallbladder distension. Electronically Signed   By: Markus Daft M.D.   On: 11/07/2020 17:58  [2 weeks]   Assessment: 69 year old male well known to [place] from recent several week admission for diarrhea and rectal bleeding, found to have CT findings (without contrast) of colitis,developing rectal bleeding during hospitalization in setting of Eliquis, receivingmultiple units ofPRBCs,undergoing colonoscopy during recentadmission with poor prep and incomplete exam. Blood noted in rectum, sigmoid, descending colon, and at splenic flexure. Colonoscopy findings suspicious for segmental or ischemic colitis. Pathology without changes or features of IBD, query drug-induced or ischemic. CMV stains negative.He was started empirically on steroids with mild improvement in diarrhea. Prior hospitalization received empiric course of Cipro and Flagyl. Prior hospitalization negative Cdiff and GI pathogen panel.  Patient presented back to the hospital 12/3 due to an episode of syncope while getting up to go to dialysis from SNF.  He continues with recurrent orthostatic hypotension, on midodrine, Florinef, supportive measures with lower extremity compression and abdominal binder.  Keeping up with GI losses has been a challenge.  Due to ongoing watery diarrhea, GI was consulted again.  Diarrhea: Etiology unclear.  Previous work-up as outlined above.  This admission CTA abdomen and pelvis, celiac/SMA/IMA all patent.  Sigmoid colon moderately distended with mild wall thickening.  However, the sigmoid wall thickening is less impressive on the venous phase imaging.  Cecum is distended and contains a large  amount of stool.  Transverse colon distended with gas.  Second C. difficile antigen/toxin negative.  Second GI pathogen panel negative.  O&P pending.  Stool osmolality 627.  Stool sodium/stool potassium/celiac panel pending.  Rectal tube currently contains 100 cc of stool, report of 300cc of stool in last 24 hours.   Diarrhea currently being treated with Colestid 2 g daily, Creon 72,000 units with meals and 36,000 units with snacks, Imodium 2 mg 3 times daily, prednisone taper, currently on 20 mg daily.  Plan: 1. Follow up pending stool tests.  2. Continue Creon, Colestid, Imodium, Prednisone taper.  3. Follow up celiac serologies.  4. Patient had significant amount of stool right colon and distended cecum on  CTA yesterday but less colon wall thickening, picture of ileus. Patient has not been up out of bed, Will encourage him to sit up in bed as tolerated, lay on side (alternating left and right) when lying down.  5. Colonoscopy with adequate prep at later date.   Laureen Ochs. Bernarda Caffey [facility] [phone] 12/9/202111:50 AM     LOS: 6 days

## 2020-11-08 NOTE — TOC Progression Note (Signed)
Transition of Care [facility]) - Progression Note    Patient Details  Name: [name] MRN: [id number] Date of Birth: 1951-02-22  Transition [facility] Care [facility] CM/SW Contact  Shade Flood, LCSW Phone Number: 11/08/2020, 10:50 AM  Clinical Narrative:     TOC following. Pt status reviewed with MD in progression today. Per MD, earliest pt will be stable for dc is Monday 12/13. Updated Mardene Celeste at [facility]. Pt will need new Covid test prior to dc.  TOC will follow.  Expected Discharge Plan: Skilled Nursing Facility Barriers to Discharge: Continued Medical Work up  Expected Discharge Plan and Services Expected Discharge Plan: [place]                                               Social Determinants of Health (SDOH) Interventions    Readmission Risk Interventions No flowsheet data found.

## 2020-11-09 DIAGNOSIS — K529 Noninfective gastroenteritis and colitis, unspecified: Secondary | ICD-10-CM | POA: Diagnosis not present

## 2020-11-09 LAB — CBC
HCT: 28.7 % — ABNORMAL LOW (ref 39.0–52.0)
Hemoglobin: 8.9 g/dL — ABNORMAL LOW (ref 13.0–17.0)
MCH: 29 pg (ref 26.0–34.0)
MCHC: 31 g/dL (ref 30.0–36.0)
MCV: 93.5 fL (ref 80.0–100.0)
Platelets: 157 10*3/uL (ref 150–400)
RBC: 3.07 MIL/uL — ABNORMAL LOW (ref 4.22–5.81)
RDW: 20 % — ABNORMAL HIGH (ref 11.5–15.5)
WBC: 5.4 10*3/uL (ref 4.0–10.5)
nRBC: 0 % (ref 0.0–0.2)

## 2020-11-09 LAB — RENAL FUNCTION PANEL
Albumin: 2.1 g/dL — ABNORMAL LOW (ref 3.5–5.0)
Anion gap: 11 (ref 5–15)
BUN: 41 mg/dL — ABNORMAL HIGH (ref 8–23)
CO2: 21 mmol/L — ABNORMAL LOW (ref 22–32)
Calcium: 7.1 mg/dL — ABNORMAL LOW (ref 8.9–10.3)
Chloride: 97 mmol/L — ABNORMAL LOW (ref 98–111)
Creatinine, Ser: 2.93 mg/dL — ABNORMAL HIGH (ref 0.61–1.24)
GFR, Estimated: 22 mL/min — ABNORMAL LOW (ref >60)
Glucose, Bld: 305 mg/dL — ABNORMAL HIGH (ref 70–99)
Phosphorus: 2.8 mg/dL (ref 2.5–4.6)
Potassium: 3.6 mmol/L (ref 3.5–5.1)
Sodium: 129 mmol/L — ABNORMAL LOW (ref 135–145)

## 2020-11-09 LAB — IRON AND TIBC
Iron: 26 ug/dL — ABNORMAL LOW (ref 45–182)
Saturation Ratios: 13 % — ABNORMAL LOW (ref 17.9–39.5)
TIBC: 197 ug/dL — ABNORMAL LOW (ref 250–450)
UIBC: 171 ug/dL

## 2020-11-09 LAB — FOLATE: Folate: 68.8 ng/mL (ref >5.9)

## 2020-11-09 LAB — VITAMIN B12: Vitamin B-12: 894 pg/mL (ref 180–914)

## 2020-11-09 LAB — FERRITIN: Ferritin: 650 ng/mL — ABNORMAL HIGH (ref 24–336)

## 2020-11-09 NOTE — Plan of Care (Signed)

## 2020-11-09 NOTE — Care Management Important Message (Signed)
Important Message  Patient Details  Name: [name] MRN: [id number] Date of Birth: [birth year]   Medicare Important Message Given:  Yes     [name] 11/09/2020, 4:02 PM

## 2020-11-09 NOTE — Progress Notes (Addendum)
Gastroenterology attending attestation note: Agree with the below findings as written. The patient was presented to me by the APP (Advanced Practice Provider) I personally reviewed the medical chart and evaluated the patient. I personally evaluated and examined the patient by myself.  I agree with Mr. Curtis Sites H/P and assessment.  Patient has presented improvement in the stool output he has had in the last 24 hours.  Stool is still liquid in nature.  He denies having any complaints.  Notably he did not present any orthostatic hypotension today which is an improvement.  Celiac serologies came back negative, repeat ova and parasite were also negative.  We will follow result of sodium and potassium in stool to calculate osmolal gap.  We will continue with Colestid and Imodium as prior.  Maylon Peppers, MD Gastroenterology and Hepatology [facility]    Subjective: No acute events overnight. Patient reports feeling better, did not have hypotension when orthostatic measures were performed.  Has had a total of 400 cc stool output in the last 24 hours, which is liquid mostly.  Has been tolerating diet adequately.  Denies any abdominal pain, nausea, vomiting, fever, chills.  Objective: Vital signs in last 24 hours: Temp:  [97.6 F (36.4 C)-98.2 F (36.8 C)] 97.9 F (36.6 C) (12/10 1125) Pulse Rate:  [64-83] 64 (12/10 1400) Resp:  [16-18] 16 (12/10 1125) BP: (119-172)/(55-92) 130/70 (12/10 1400) SpO2:  [94 %-100 %] 98 % (12/10 1125) Weight:  [72.1 kg] 72.1 kg (12/10 1125) Last BM Date: 11/09/20 General:   Alert and oriented, pleasant Head:  Normocephalic and atraumatic. Eyes:  No icterus, sclera clear. Conjuctiva pink.  Mouth:  Without lesions, mucosa pink and moist.  Neck:  Supple, without thyromegaly or masses.  Heart:  S1, S2 present, no murmurs noted.  Lungs: Clear to auscultation bilaterally, without wheezing, rales, or rhonchi.  Abdomen:   Bowel sounds present, soft, non-tender, non-distended. No HSM or hernias noted. No rebound or guarding. No masses appreciated  Msk:  Symmetrical without gross deformities. Normal posture. Pulses:  Normal pulses noted. Extremities:  Without clubbing or edema. Neurologic:  Alert and  oriented x4;  grossly normal neurologically. Skin:  Warm and dry, intact without significant lesions.  Cervical Nodes:  No significant cervical adenopathy. Psych:  Alert and cooperative. Normal mood and affect.  Intake/Output from previous day: 12/09 0701 - 12/10 0700 In: 243 [P.O.:240; I.V.:3] Out: 600 [Urine:200; Stool:400] Intake/Output this shift: No intake/output data recorded.  Lab Results: Recent Labs    11/07/20 1047 11/08/20 0510 11/09/20 0516  WBC 8.8 6.6 5.4  HGB 8.0* 7.5* 8.9*  HCT 25.7* 23.9* 28.7*  PLT 152 147* 157   BMET Recent Labs    11/07/20 0500 11/08/20 0510  NA 130* 131*  K 3.3* 3.4*  CL 98 99  CO2 24 24  GLUCOSE 222* 185*  BUN 42* 30*  CREATININE 3.09* 2.57*  CALCIUM 7.3* 7.1*   LFT Recent Labs    11/08/20 0510  ALBUMIN 2.0*   PT/INR No results for input(s): LABPROT, INR in the last 72 hours. Hepatitis Panel No results for input(s): HEPBSAG, HCVAB, HEPAIGM, HEPBIGM in the last 72 hours.   Studies/Results: CT Angio Abd/Pel w/ and/or w/o  Result Date: 11/07/2020 CLINICAL DATA:  Evaluate for mesenteric ischemia. End-stage renal disease on dialysis. Bowel wall thickening on exam from 10/12/2020. EXAM: CTA ABDOMEN AND PELVIS WITHOUT AND WITH CONTRAST TECHNIQUE: Multidetector CT imaging of the abdomen and pelvis was performed using the standard protocol  during bolus administration of intravenous contrast. Multiplanar reconstructed images and MIPs were obtained and reviewed to evaluate the vascular anatomy. CONTRAST:  31mL OMNIPAQUE IOHEXOL 350 MG/ML SOLN COMPARISON:  10/22/2020 FINDINGS: VASCULAR Aorta: Mild atherosclerotic disease in the abdominal aorta without  aneurysm or dissection. Celiac: Celiac trunk is patent without significant stenosis. Focal dilatation of the trunk measures up to 9 mm. Main branch vessels are patent. SMA: Replaced right hepatic artery. SMA is widely patent without significant stenosis. No evidence for aneurysm or dissection. Renals: Main right renal artery is widely patent without stenosis, aneurysm or dissection. There is small accessory right renal artery. High-grade focal narrowing in the proximal main left renal artery without significant plaque in this area. Findings could be related to a dissection but indeterminate. Small accessory left renal artery is patent. IMA: Patent Inflow: Atherosclerotic disease involving the proximal left common iliac artery without significant stenosis. Common iliac arteries are patent bilaterally. Disease and stenosis involving the right internal iliac artery. Left internal iliac artery is patent. Bilateral external iliac arteries are widely patent. Proximal Outflow: Proximal femoral arteries are patent bilaterally. Veins: Main portal venous system is patent. Limited evaluation of the superior cavoatrial junction on the arterial phase imaging due to non-opacified blood in this area. IVC and renal veins are patent. Narrowing of the left common iliac vein from the right common iliac artery is a normal anatomic variant. Review of the MIP images confirms the above findings. NON-VASCULAR Lower chest: Small bilateral pleural effusions with compressive atelectasis. Hepatobiliary: High-density material in the gallbladder are suggestive for small stones. The gallbladder is moderately distended without definite inflammatory changes. There is colon anterior to the liver. No discrete liver lesion. No biliary dilatation. Gallbladder distension has minimally changed since the previous examination. Pancreas: Unremarkable. No pancreatic ductal dilatation or surrounding inflammatory changes. Spleen: Normal in size without focal  abnormality. Adrenals/Urinary Tract: Normal appearance of the adrenal glands. Both kidneys are small with scattered calcifications. Findings are suggestive for nonobstructive renal calculi. Stomach/Bowel: Normal appearance of the stomach and duodenum. There is a rectal catheter with an inflated balloon. Sigmoid colon is moderately distended with mild wall thickening. However, the sigmoid wall thickening is less impressive on the venous phase imaging. Cecum is distended and contains a large amount of stool. Transverse colon is distended with gas. Lymphatic: No significant lymph node enlargement in the abdomen or pelvis. Reproductive: Stable appearance of the prostate. Other: Diffuse subcutaneous edema. Small amount of pelvic ascites which is new. Trace ascites in the left lower quadrant of the abdomen. Trace ascites in the upper abdomen. Negative for free air. Musculoskeletal: Chronic disc space narrowing with endplate changes at G9-F6. Stable disc space narrowing at L5-S1. IMPRESSION: VASCULAR 1. Atherosclerotic disease in the abdominal aorta without aneurysm or significant stenosis. Aortic Atherosclerosis (ICD10-I70.0). 2. Main mesenteric arteries are patent without significant stenosis. No evidence to suggest chronic mesenteric ischemia. 3. Multiple renal arteries as described. Focal narrowing the proximal left main renal artery that could be related to atherosclerotic disease or focal dissection in this area. NON-VASCULAR 1. The sigmoid colon has been partially decompressed since 10/22/2020 and placement of the rectal tube. There continues to be gaseous distension of the sigmoid colon and transverse colon. Mild wall thickening in the sigmoid colon is nonspecific but may be related to the partial decompression rather than infectious or inflammatory process. Findings are suggestive for an underlying colonic ileus. 2. Bilateral kidneys are atrophic with bilateral calcifications. Findings are compatible with history  of end-stage renal  disease. Calcifications could represent nonobstructive renal calculi. 3. Diffuse subcutaneous edema with small amount of ascites, most prominent in the pelvis. 4. Significant disc space disease at L2-L3. 5. Bilateral pleural effusions with compressive atelectasis at the lung bases. 6. Cholelithiasis with moderate gallbladder distension. Electronically Signed   By: Markus Daft M.D.   On: 11/07/2020 17:58    Assessment: 69 year old male well known to [place] from recent several week admission for diarrhea and rectal bleeding, found to have CT findings (without contrast) of colitis,developing rectal bleeding during hospitalization in setting of Eliquis, receivingmultiple units ofPRBCs,undergoing colonoscopyduringrecentadmission with poor prep and incomplete exam. Blood noted in rectum, sigmoid, descending colon, and at splenic flexure. Colonoscopy findings suspicious for segmental or ischemic colitis. Pathology without changes or features of IBD, query drug-induced or ischemic. CMV stains negative.He was started empirically on steroids with mild improvement in diarrhea. Prior hospitalization received empiric course of Cipro and Flagyl. Prior hospitalization negative Cdiff and GI pathogen panel.Patient presented back to the hospital 12/3 due to an episode of syncope while getting up to go to dialysis from SNF. He continues with recurrent orthostatic hypotension, on midodrine, Florinef, supportive measures with lower extremity compression and abdominal binder. Keeping up with GI losses has been a challenge. Due to ongoing watery diarrhea, GI was consulted again.  Diarrhea: Etiology unclear. Previous work-up as outlined above. This admission CTA abdomen and pelvis, celiac/SMA/IMA all patent.  Sigmoid colon moderately distended with mild wall thickening.  However, the sigmoid wall thickening is less impressive on the venous phase imaging.  Cecum is distended and contains a large amount of  stool.  Transverse colon distended with gas.  Second C. difficile antigen/toxin negative.  Second GI pathogen panel negative.  O&P negative.  Stool osmolality 627.  Stool sodium/stool potassium pending.  TTG IgA and total Iga both normal. Rectal tube currently contains minimal liquid stool, report of 400 cc stools in last 24 hours.   Diarrhea currently being treated with Colestid 2 g daily, Creon 72,000 units with meals and 36,000 units with snacks, Imodium 2 mg 3 times daily, prednisone taper, currently on 20 mg daily.  Plan: 1. Follow for pending stool results 2. Continue Creon, Colestid, Imodium, prednisone taper 3. Encourage sitting up in bed, frequent turning when laying in bed 4. Will need outpatient colonoscopy eventually 5. Supportive measures    LOS: 7 days    11/09/2020, 2:18 PM

## 2020-11-09 NOTE — Progress Notes (Signed)
Patient ID: ASAHD CAN, male   DOB: 09-May-1951, 69 y.o.   MRN: [id number]   S:    No interval events  Orthostatics yesteray with sig drop in SBP ,but remained >120, HR inc'd too; he wasn't orthostatic  ? Stool outpt slowing  O:BP (!) 154/75 (BP Location: Left Arm)   Pulse 66   Temp 97.6 F (36.4 C) (Oral)   Resp 17   Ht 6\' 2"  (1.88 m)   Wt 71.2 kg   SpO2 98%   BMI 20.15 kg/m   Intake/Output Summary (Last 24 hours) at 11/09/2020 1034 Last data filed at 11/09/2020 0415 Gross per 24 hour  Intake 240 ml  Output 600 ml  Net -360 ml   Intake/Output:   I/O last 3 completed shifts: In: 483 [P.O.:480; I.V.:3] Out: 1150 [Urine:600; Stool:550]  Intake/Output this shift:  No intake/output data recorded. Weight change:    NAD RIJ TDC No sig LEE S/nt Regular CTAB Nonfocl EOMI NCAT   Recent Labs  Lab 11/02/20 1209 11/03/20 0416 11/04/20 0702 11/05/20 0500 11/07/20 0500 11/08/20 0510  NA 132* 131* 130* 128* 130* 131*  K 2.9* 3.5 3.5 3.1* 3.3* 3.4*  CL 99 99 99 97* 98 99  CO2 23 24 24 23 24 24   GLUCOSE 157* 90 265* 235* 222* 185*  BUN 46* 50* 46* 55* 42* 30*  CREATININE 2.92* 3.22* 2.90* 3.50* 3.09* 2.57*  ALBUMIN 1.9* 1.9*  --   --   --  2.0*  CALCIUM 7.4* 7.6* 7.5* 7.3* 7.3* 7.1*  PHOS  --   --   --   --   --  2.2*  AST 15 11*  --   --   --   --   ALT 14 12  --   --   --   --    Liver Function Tests: Recent Labs  Lab 11/02/20 1209 11/03/20 0416 11/08/20 0510  AST 15 11*  --   ALT 14 12  --   ALKPHOS 46 47  --   BILITOT 0.5 0.6  --   PROT 4.5* 4.4*  --   ALBUMIN 1.9* 1.9* 2.0*   No results for input(s): LIPASE, AMYLASE in the last 168 hours. No results for input(s): AMMONIA in the last 168 hours. CBC: Recent Labs  Lab 11/02/20 1209 11/03/20 0416 11/04/20 0702 11/05/20 0500 11/07/20 1047 11/08/20 0510 11/09/20 0516  WBC 10.3   < > 5.8 9.8 8.8 6.6 5.4  NEUTROABS 8.4*  --   --   --   --   --   --   HGB 8.6*   < > 8.0* 8.1* 8.0* 7.5* 8.9*   HCT 27.5*   < > 26.1* 25.4* 25.7* 23.9* 28.7*  MCV 92.0   < > 93.9 92.7 93.5 92.6 93.5  PLT 214   < > 171 182 152 147* 157   < > = values in this interval not displayed.   Cardiac Enzymes: No results for input(s): CKTOTAL, CKMB, CKMBINDEX, TROPONINI in the last 168 hours. CBG: No results for input(s): GLUCAP in the last 168 hours.  Iron Studies:  Recent Labs    11/09/20 0516  IRON 26*  TIBC 197*  FERRITIN 650*   Studies/Results: CT Angio Abd/Pel w/ and/or w/o  Result Date: 11/07/2020 CLINICAL DATA:  Evaluate for mesenteric ischemia. End-stage renal disease on dialysis. Bowel wall thickening on exam from 10/12/2020. EXAM: CTA ABDOMEN AND PELVIS WITHOUT AND WITH CONTRAST TECHNIQUE: Multidetector CT imaging of the abdomen  and pelvis was performed using the standard protocol during bolus administration of intravenous contrast. Multiplanar reconstructed images and MIPs were obtained and reviewed to evaluate the vascular anatomy. CONTRAST:  29mL OMNIPAQUE IOHEXOL 350 MG/ML SOLN COMPARISON:  10/22/2020 FINDINGS: VASCULAR Aorta: Mild atherosclerotic disease in the abdominal aorta without aneurysm or dissection. Celiac: Celiac trunk is patent without significant stenosis. Focal dilatation of the trunk measures up to 9 mm. Main branch vessels are patent. SMA: Replaced right hepatic artery. SMA is widely patent without significant stenosis. No evidence for aneurysm or dissection. Renals: Main right renal artery is widely patent without stenosis, aneurysm or dissection. There is small accessory right renal artery. High-grade focal narrowing in the proximal main left renal artery without significant plaque in this area. Findings could be related to a dissection but indeterminate. Small accessory left renal artery is patent. IMA: Patent Inflow: Atherosclerotic disease involving the proximal left common iliac artery without significant stenosis. Common iliac arteries are patent bilaterally. Disease and  stenosis involving the right internal iliac artery. Left internal iliac artery is patent. Bilateral external iliac arteries are widely patent. Proximal Outflow: Proximal femoral arteries are patent bilaterally. Veins: Main portal venous system is patent. Limited evaluation of the superior cavoatrial junction on the arterial phase imaging due to non-opacified blood in this area. IVC and renal veins are patent. Narrowing of the left common iliac vein from the right common iliac artery is a normal anatomic variant. Review of the MIP images confirms the above findings. NON-VASCULAR Lower chest: Small bilateral pleural effusions with compressive atelectasis. Hepatobiliary: High-density material in the gallbladder are suggestive for small stones. The gallbladder is moderately distended without definite inflammatory changes. There is colon anterior to the liver. No discrete liver lesion. No biliary dilatation. Gallbladder distension has minimally changed since the previous examination. Pancreas: Unremarkable. No pancreatic ductal dilatation or surrounding inflammatory changes. Spleen: Normal in size without focal abnormality. Adrenals/Urinary Tract: Normal appearance of the adrenal glands. Both kidneys are small with scattered calcifications. Findings are suggestive for nonobstructive renal calculi. Stomach/Bowel: Normal appearance of the stomach and duodenum. There is a rectal catheter with an inflated balloon. Sigmoid colon is moderately distended with mild wall thickening. However, the sigmoid wall thickening is less impressive on the venous phase imaging. Cecum is distended and contains a large amount of stool. Transverse colon is distended with gas. Lymphatic: No significant lymph node enlargement in the abdomen or pelvis. Reproductive: Stable appearance of the prostate. Other: Diffuse subcutaneous edema. Small amount of pelvic ascites which is new. Trace ascites in the left lower quadrant of the abdomen. Trace  ascites in the upper abdomen. Negative for free air. Musculoskeletal: Chronic disc space narrowing with endplate changes at N8-G9. Stable disc space narrowing at L5-S1. IMPRESSION: VASCULAR 1. Atherosclerotic disease in the abdominal aorta without aneurysm or significant stenosis. Aortic Atherosclerosis (ICD10-I70.0). 2. Main mesenteric arteries are patent without significant stenosis. No evidence to suggest chronic mesenteric ischemia. 3. Multiple renal arteries as described. Focal narrowing the proximal left main renal artery that could be related to atherosclerotic disease or focal dissection in this area. NON-VASCULAR 1. The sigmoid colon has been partially decompressed since 10/22/2020 and placement of the rectal tube. There continues to be gaseous distension of the sigmoid colon and transverse colon. Mild wall thickening in the sigmoid colon is nonspecific but may be related to the partial decompression rather than infectious or inflammatory process. Findings are suggestive for an underlying colonic ileus. 2. Bilateral kidneys are atrophic with bilateral calcifications. Findings  are compatible with history of end-stage renal disease. Calcifications could represent nonobstructive renal calculi. 3. Diffuse subcutaneous edema with small amount of ascites, most prominent in the pelvis. 4. Significant disc space disease at L2-L3. 5. Bilateral pleural effusions with compressive atelectasis at the lung bases. 6. Cholelithiasis with moderate gallbladder distension. Electronically Signed   By: Markus Daft M.D.   On: 11/07/2020 17:58   . (feeding supplement) PROSource Plus  30 mL Oral BID BM  . amiodarone  200 mg Oral Daily  . Chlorhexidine Gluconate Cloth  6 each Topical Q0600  . Chlorhexidine Gluconate Cloth  6 each Topical Q0600  . colestipol  2 g Oral Daily  . darbepoetin (ARANESP) injection - DIALYSIS  100 mcg Intravenous Q Wed-HD  . famotidine  20 mg Oral Daily  . feeding supplement  237 mL Oral BID BM  .  fludrocortisone  0.1 mg Oral Daily  . lipase/protease/amylase  36,000 Units Oral With snacks  . lipase/protease/amylase  72,000 Units Oral TID WC  . loperamide  2 mg Oral TID  . metoprolol tartrate  12.5 mg Oral BID  . midodrine  10 mg Oral TID with meals  . multivitamin  1 tablet Oral QHS  . predniSONE  20 mg Oral Q breakfast   Followed by  . [START ON 11/13/2020] predniSONE  10 mg Oral Q breakfast  . simvastatin  20 mg Oral QPM  . sodium chloride flush  3 mL Intravenous Q12H    Assessment/Plan:  1. Recurrent Syncope/hypotension; sig orthostasis:On TID Midodrine + Florinef + compression stockings. HD with minimal UF, prob exacerbated by #2, #6, #9 1. Keep even with HD--> sig UOP of > 1 L daily as well as copious stool output. 2. Colitis- GI following s/p ABX. on pred taper. Still with diarrhea-   3. [facility]. HD today on schedule: no UF, no heparin, 3.5h, 3K 4. Anemia:of CKD- No heparin with HD. ESA with HD qWed while inpatient.  Repeat Fe panel 5. 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 Calcium corrects; Phos low no binders encourage PO 6. PCM / Nutrition:per primary, push protein-  Last phos was low, no binder 7. H/o Hypertension:see #1-  Will likely be one of those patients who do not need much fluid removed at all with HD 8. Vascular access- will need to f/u with Dr. Donnetta Hutching for AVF once stable for discharge. 9. A fib- on amio and MTP, AC per cardiology off apixaban 10. Hyponatremia. Monitor, mild, ASx stable 11. Disposition- SNF   Rexene Agent  MD [facility]

## 2020-11-09 NOTE — Progress Notes (Signed)
PROGRESS NOTE    [name]  OHY:[id number] DOB: 05/15/1951 DOA: 11/02/2020 PCP: Patient, No Pcp Per   Brief Narrative:  Per HPI: [name] a 69 y.o.malewith medical history significant fortype 2 diabetes, CKD stage V recently initiated on hemodialysis MWF, atrial fibrillation/flutter on amiodarone and metoprolol and Eliquis, recurrent orthostatic hypotension, dyslipidemia, and recent discharge on 12/2 for colitis with associated adynamic ileus and acute blood loss anemia who presented back to the ED today after he was noted to have an episode of syncope while getting up to go to hemodialysis from his SNF. On arrival to the ED his systolic blood pressures were in the 70 mmHg range. He is noted to have some diarrhea, but he has recently completed course of ciprofloxacin and Flagyl by 11/23. He was noted to have nonspecific colitis on pathology after recent colonoscopy on 11/17 and was started on prednisone taper which he continues to take at this time.  -Patient continues to remain orthostatic and therefore continues to blame blood.  This is complicated by has ongoing colitis on high liquid stool output.  GI reconsulted on 12/6 to continue further evaluation.  Creon and Imodium started 3 times daily on 12/6 however patient continues to have copious stool output, have been unable to remove rectal tube.  12/10: stool seems to be slowing down some.   Assessment & Plan:   Active Problems:   Syncope due to orthostatic hypotension   ESRD on hemodialysis ([facility])  Syncope secondary to recurrent orthostatic hypotension -Continue midodrine as previous -continue Florinef -Lower extremity stockingswith abdominal binder -Plan for hemodialysis per nephrology12/6, 12/8, 12/10.  -May need palliative care evaluation eventually if he fails to continue improvement  Lactic acidosis likely related to above -Likely due to soft blood pressure readings with no signs of other acute infection  noted  Ongoing colitis with diarrhea - stool seems to be slowing down some -Continue prednisone 30 mg daily for 1 week and then taper by 10 mg/week thereafter -Recent treatment with ciprofloxacin and Flagyl for 10 days completed -Repeat Cdiff negative -Formal consultation with GI appreciated for further evaluation -Check 5-Ht as ordered to evaluate serotonin levels and see if possible carcinoid syndrome could be a cause  ESRD on HD MWF -Appreciate nephrology evaluation for hemodialysis while inpatient, had hemodialysis 12/6, 12/8, 12/10 with minimal to no fluid removal  Chronic anemia-stable -Received 5 units of PRBCs during his last admission -Continue to hold apixaban per prior admission recommendations -No overt bleeding noted -Hemoglobin remains stable -CBC being monitored  Paroxysmal atrial fibrillation/flutter -Continue close monitoring on telemetry -Resume home amiodarone and hold metoprolol while blood pressures are soft -Asymptomatic -Hold apixaban per cardiology recommendations on prior admission  Dyslipidemia -Continue Zocor  Hypoalbuminemia -Likely mild to moderate protein calorie malnutrition -Dietitian consultation appreciated  DVT prophylaxis:SCDs Code Status:Full Family Communication:Pt prefers to call; none at bedside Disposition Plan: SNF when discharged Status is: Inpatient  Remains inpatient appropriate because:IV treatments appropriate due to intensity of illness or inability to take PO and Inpatient level of care appropriate due to severity of illness  Dispo: The patient is from:SNF Anticipated d/c is to:SNF Anticipated d/c date is:2days Patient currently is not medically stable to d/c.  Will plan to discharge once stool output has diminished and patient less orthostatic.  GI work-up ongoing.  Ongoing hemodialysis treatments.   Consultants:  Nephrology  GI  Procedures:  See  below  Antimicrobials:   None  Subjective: Stools seems to be slowing down on new  regimen  Objective: Vitals:   11/09/20 1400 11/09/20 1430 11/09/20 1500 11/09/20 1510  BP: 130/70 119/70 116/70 126/71  Pulse: 64 72 73 72  Resp:    16  Temp:    98 F (36.7 C)  TempSrc:    Oral  SpO2:    98%  Weight:    72.2 kg  Height:        Intake/Output Summary (Last 24 hours) at 11/09/2020 1643 Last data filed at 11/09/2020 1505 Gross per 24 hour  Intake 240 ml  Output 200 ml  Net 40 ml   Filed Weights   11/07/20 1050 11/09/20 1125 11/09/20 1510  Weight: 71.2 kg 72.1 kg 72.2 kg    Examination:  General exam: awake, alert, NAD, Appears calm and comfortable  Respiratory system: Clear to auscultation. Respiratory effort normal. Cardiovascular system: normal S1 & S2 heard.  Gastrointestinal system: Abdomen is nondistended, soft and nontender.  Central nervous system: Alert and awake Extremities: No edema Skin: No rashes, lesions or ulcers Psychiatry: Flat affect unchanged fecal bag with liquid stool output  Data Reviewed: I have personally reviewed following labs and imaging studies  CBC: Recent Labs  Lab 11/04/20 0702 11/05/20 0500 11/07/20 1047 11/08/20 0510 11/09/20 0516  WBC 5.8 9.8 8.8 6.6 5.4  HGB 8.0* 8.1* 8.0* 7.5* 8.9*  HCT 26.1* 25.4* 25.7* 23.9* 28.7*  MCV 93.9 92.7 93.5 92.6 93.5  PLT 171 182 152 147* 245   Basic Metabolic Panel: Recent Labs  Lab 11/03/20 0416 11/04/20 0702 11/05/20 0500 11/07/20 0500 11/08/20 0510 11/09/20 1525  NA 131* 130* 128* 130* 131* 129*  K 3.5 3.5 3.1* 3.3* 3.4* 3.6  CL 99 99 97* 98 99 97*  CO2 24 24 23 24 24  21*  GLUCOSE 90 265* 235* 222* 185* 305*  BUN 50* 46* 55* 42* 30* 41*  CREATININE 3.22* 2.90* 3.50* 3.09* 2.57* 2.93*  CALCIUM 7.6* 7.5* 7.3* 7.3* 7.1* 7.1*  MG 2.2  --  1.7 1.6*  --   --   PHOS  --   --   --   --  2.2* 2.8   GFR: Estimated Creatinine Clearance: 24 mL/min (A) (by C-G formula based on SCr of  2.93 mg/dL (H)). Liver Function Tests: Recent Labs  Lab 11/03/20 0416 11/08/20 0510 11/09/20 1525  AST 11*  --   --   ALT 12  --   --   ALKPHOS 47  --   --   BILITOT 0.6  --   --   PROT 4.4*  --   --   ALBUMIN 1.9* 2.0* 2.1*   No results for input(s): LIPASE, AMYLASE in the last 168 hours. No results for input(s): AMMONIA in the last 168 hours. Coagulation Profile: No results for input(s): INR, PROTIME in the last 168 hours. Cardiac Enzymes: No results for input(s): CKTOTAL, CKMB, CKMBINDEX, TROPONINI in the last 168 hours. BNP (last 3 results) No results for input(s): PROBNP in the last 8760 hours. HbA1C: No results for input(s): HGBA1C in the last 72 hours. CBG: No results for input(s): GLUCAP in the last 168 hours. Lipid Profile: No results for input(s): CHOL, HDL, LDLCALC, TRIG, CHOLHDL, LDLDIRECT in the last 72 hours. Thyroid Function Tests: No results for input(s): TSH, T4TOTAL, FREET4, T3FREE, THYROIDAB in the last 72 hours. Anemia Panel: Recent Labs    11/08/20 0510 11/09/20 0516  VITAMINB12  --  894  FOLATE  --  68.8  FERRITIN  --  650*  TIBC  --  197*  IRON  --  26*  RETICCTPCT 2.6  --    Sepsis Labs: No results for input(s): PROCALCITON, LATICACIDVEN in the last 168 hours.  Recent Results (from the past 240 hour(s))  SARS Coronavirus 2 by RT PCR (hospital order, performed in [facility] hospital lab) Nasopharyngeal Nasopharyngeal Swab     Status: None   Collection Time: 11/01/20  9:11 AM   Specimen: Nasopharyngeal Swab  Result Value Ref [place] Status   SARS Coronavirus 2 NEGATIVE NEGATIVE Final    Comment: (NOTE) SARS-CoV-2 target nucleic acids are NOT DETECTED.  The SARS-CoV-2 RNA is generally detectable in upper and lower respiratory specimens during the acute phase of infection. The lowest concentration of SARS-CoV-2 viral copies this assay can detect is 250 copies / mL. A negative result does not preclude SARS-CoV-2 infection and should not be  used as the sole basis for treatment or other patient management decisions.  A negative result may occur with improper specimen collection / handling, submission of specimen other than nasopharyngeal swab, presence of viral mutation(s) within the areas targeted by this assay, and inadequate number of viral copies (<250 copies / mL). A negative result must be combined with clinical observations, patient history, and epidemiological information.  Fact Sheet for Patients:   StrictlyIdeas.no  Fact Sheet for Healthcare Providers: BankingDealers.co.za  This test is not yet approved or  cleared by the [place] FDA and has been authorized for detection and/or diagnosis of SARS-CoV-2 by FDA under an Emergency Use Authorization (EUA).  This EUA will remain in effect (meaning this test can be used) for the duration of the COVID-19 declaration under Section 564(b)(1) of the Act, 21 U.S.C. section 360bbb-3(b)(1), unless the authorization is terminated or revoked sooner.  Performed at [facility], [address], [place], [place] [address]   MRSA PCR Screening     Status: None   Collection Time: 11/02/20  2:48 PM   Specimen: Nasopharyngeal  Result Value Ref [place] Status   MRSA by PCR NEGATIVE NEGATIVE Final    Comment:        The GeneXpert MRSA Assay (FDA approved for NASAL specimens only), is one component of a comprehensive MRSA colonization surveillance program. It is not intended to diagnose MRSA infection nor to guide or monitor treatment for MRSA infections. Performed at [facility], [address], [place], [place] [address]   C Difficile Quick Screen w PCR reflex     Status: None   Collection Time: 11/02/20  3:20 PM   Specimen: STOOL  Result Value Ref [place] Status   C Diff antigen NEGATIVE NEGATIVE Final   C Diff toxin NEGATIVE NEGATIVE Final   C Diff interpretation No C. difficile detected.  Final    Comment: Performed at  [facility], [address], [place], [place] [address]  Resp Panel by RT-PCR (Flu A&B, Covid) Nasopharyngeal Swab     Status: None   Collection Time: 11/05/20 10:41 AM   Specimen: Nasopharyngeal Swab; Nasopharyngeal(NP) swabs in vial transport medium  Result Value Ref [place] Status   SARS Coronavirus 2 by RT PCR NEGATIVE NEGATIVE Final    Comment: (NOTE) SARS-CoV-2 target nucleic acids are NOT DETECTED.  The SARS-CoV-2 RNA is generally detectable in upper respiratory specimens during the acute phase of infection. The lowest concentration of SARS-CoV-2 viral copies this assay can detect is 138 copies/mL. A negative result does not preclude SARS-Cov-2 infection and should not be used as the sole basis for treatment or other patient management  decisions. A negative result may occur with  improper specimen collection/handling, submission of specimen other than nasopharyngeal swab, presence of viral mutation(s) within the areas targeted by this assay, and inadequate number of viral copies(<138 copies/mL). A negative result must be combined with clinical observations, patient history, and epidemiological information. The expected result is Negative.  Fact Sheet for Patients:  EntrepreneurPulse.com.au  Fact Sheet for Healthcare Providers:  IncredibleEmployment.be  This test is no t yet approved or cleared by the [place] FDA and  has been authorized for detection and/or diagnosis of SARS-CoV-2 by FDA under an Emergency Use Authorization (EUA). This EUA will remain  in effect (meaning this test can be used) for the duration of the COVID-19 declaration under Section 564(b)(1) of the Act, 21 U.S.C.section 360bbb-3(b)(1), unless the authorization is terminated  or revoked sooner.       Influenza A by PCR NEGATIVE NEGATIVE Final   Influenza B by PCR NEGATIVE NEGATIVE Final    Comment: (NOTE) The Xpert Xpress SARS-CoV-2/FLU/RSV plus assay is  intended as an aid in the diagnosis of influenza from Nasopharyngeal swab specimens and should not be used as a sole basis for treatment. Nasal washings and aspirates are unacceptable for Xpert Xpress SARS-CoV-2/FLU/RSV testing.  Fact Sheet for Patients: EntrepreneurPulse.com.au  Fact Sheet for Healthcare Providers: IncredibleEmployment.be  This test is not yet approved or cleared by the [place] FDA and has been authorized for detection and/or diagnosis of SARS-CoV-2 by FDA under an Emergency Use Authorization (EUA). This EUA will remain in effect (meaning this test can be used) for the duration of the COVID-19 declaration under Section 564(b)(1) of the Act, 21 U.S.C. section 360bbb-3(b)(1), unless the authorization is terminated or revoked.  Performed at [facility], [address], [place], [place] [address]   OVA + PARASITE EXAM     Status: None   Collection Time: 11/05/20  3:44 PM   Specimen: Per Rectum; Stool  Result Value Ref [place] Status   OVA + PARASITE EXAM Final report  Final    Comment: (NOTE) These results were obtained using wet preparation(s) and trichrome stained smear. This test does not include testing for Cryptosporidium parvum, Cyclospora, or Microsporidia. Performed At: [place], [place] [id number] Truddie Coco MD YQ:[id number]    Source of Sample STOOL  Final    Comment: Performed at [facility], [address] [place], [place] [address]  Gastrointestinal Panel by PCR , Stool     Status: None   Collection Time: 11/06/20  1:25 PM   Specimen: Stool  Result Value Ref [place] Status   Campylobacter species NOT DETECTED NOT DETECTED Final   Plesimonas shigelloides NOT DETECTED NOT DETECTED Final   Salmonella species NOT DETECTED NOT DETECTED Final   Yersinia enterocolitica NOT DETECTED NOT DETECTED Final   Vibrio species NOT DETECTED NOT DETECTED Final   Vibrio cholerae NOT  DETECTED NOT DETECTED Final   Enteroaggregative E coli (EAEC) NOT DETECTED NOT DETECTED Final   Enteropathogenic E coli (EPEC) NOT DETECTED NOT DETECTED Final   Enterotoxigenic E coli (ETEC) NOT DETECTED NOT DETECTED Final   Shiga like toxin producing E coli (STEC) NOT DETECTED NOT DETECTED Final   Shigella/Enteroinvasive E coli (EIEC) NOT DETECTED NOT DETECTED Final   Cryptosporidium NOT DETECTED NOT DETECTED Final   Cyclospora cayetanensis NOT DETECTED NOT DETECTED Final   Entamoeba histolytica NOT DETECTED NOT DETECTED Final   Giardia lamblia NOT DETECTED NOT DETECTED Final   Adenovirus F40/41 NOT DETECTED  NOT DETECTED Final   Astrovirus NOT DETECTED NOT DETECTED Final   Norovirus GI/GII NOT DETECTED NOT DETECTED Final   Rotavirus A NOT DETECTED NOT DETECTED Final   Sapovirus (I, II, IV, and V) NOT DETECTED NOT DETECTED Final    Comment: Performed at [facility], [address] [place], [place] [address]    Radiology Studies: No results found. Scheduled Meds: . (feeding supplement) PROSource Plus  30 mL Oral BID BM  . amiodarone  200 mg Oral Daily  . Chlorhexidine Gluconate Cloth  6 each Topical Q0600  . Chlorhexidine Gluconate Cloth  6 each Topical Q0600  . colestipol  2 g Oral Daily  . darbepoetin (ARANESP) injection - DIALYSIS  100 mcg Intravenous Q Wed-HD  . famotidine  20 mg Oral Daily  . feeding supplement  237 mL Oral BID BM  . fludrocortisone  0.1 mg Oral Daily  . lipase/protease/amylase  36,000 Units Oral With snacks  . lipase/protease/amylase  72,000 Units Oral TID WC  . loperamide  2 mg Oral TID  . metoprolol tartrate  12.5 mg Oral BID  . midodrine  10 mg Oral TID with meals  . multivitamin  1 tablet Oral QHS  . predniSONE  20 mg Oral Q breakfast   Followed by  . [START ON 11/13/2020] predniSONE  10 mg Oral Q breakfast  . simvastatin  20 mg Oral QPM  . sodium chloride flush  3 mL Intravenous Q12H   Continuous Infusions: . sodium chloride    .  sodium chloride    . sodium chloride       LOS: 7 days   Time spent: 35 minutes  Cletus Mehlhoff Wynetta Emery, MD How to contact the [facility] Attending or Consulting provider [place] or covering provider during after hours [place], for this patient?  1. Check the care team in [facility] and look for a) attending/consulting [facility] provider listed and b) the [facility] team listed 2. Log into [email or web address] and use Page's universal password to access. If you do not have the password, please contact the hospital operator. 3. Locate the [facility] provider you are looking for under [facility] and page to a number that you can be directly reached. 4. If you still have difficulty reaching the provider, please page the [facility] (Director on Call) for the Hospitalists listed on amion for assistance.   If 7PM-7AM, please contact night-coverage [email or web address] 11/09/2020, 4:43 PM

## 2020-11-09 NOTE — Progress Notes (Signed)
   HEMODIALYSIS TREATMENT NOTE:  3.5 hour heparin-free HD completed via [facility] TDC.  NO UF / kept even.  Hemodynamically stable.  All blood was returned. Refused orthostatic pressures post-treatment d/t external catheter and flex-seal tube.  No changes from pre-dialysis assessment.  [place]well Alexandria, RN

## 2020-11-10 LAB — CBC
HCT: 25.6 % — ABNORMAL LOW (ref 39.0–52.0)
Hemoglobin: 8.1 g/dL — ABNORMAL LOW (ref 13.0–17.0)
MCH: 29.2 pg (ref 26.0–34.0)
MCHC: 31.6 g/dL (ref 30.0–36.0)
MCV: 92.4 fL (ref 80.0–100.0)
Platelets: 161 10*3/uL (ref 150–400)
RBC: 2.77 MIL/uL — ABNORMAL LOW (ref 4.22–5.81)
RDW: 20.1 % — ABNORMAL HIGH (ref 11.5–15.5)
WBC: 4 10*3/uL (ref 4.0–10.5)
nRBC: 0 % (ref 0.0–0.2)

## 2020-11-10 LAB — HEPATITIS B SURFACE ANTIGEN: Hepatitis B Surface Ag: NONREACTIVE

## 2020-11-10 NOTE — Progress Notes (Signed)
PROGRESS NOTE    [name]  FAO:[id number] DOB: 15-Jun-1951 DOA: 11/02/2020 PCP: Patient, No Pcp Per   Brief Narrative:  Per HPI: [name] a 69 y.o.malewith medical history significant fortype 2 diabetes, CKD stage V recently initiated on hemodialysis MWF, atrial fibrillation/flutter on amiodarone and metoprolol and Eliquis, recurrent orthostatic hypotension, dyslipidemia, and recent discharge on 12/2 for colitis with associated adynamic ileus and acute blood loss anemia who presented back to the ED today after he was noted to have an episode of syncope while getting up to go to hemodialysis from his SNF. On arrival to the ED his systolic blood pressures were in the 70 mmHg range. He is noted to have some diarrhea, but he has recently completed course of ciprofloxacin and Flagyl by 11/23. He was noted to have nonspecific colitis on pathology after recent colonoscopy on 11/17 and was started on prednisone taper which he continues to take at this time.  -Patient continues to remain orthostatic and therefore continues to blame blood.  This is complicated by has ongoing colitis on high liquid stool output.  GI reconsulted on 12/6 to continue further evaluation.  Creon and Imodium started 3 times daily on 12/6 however patient continues to have copious stool output, have been unable to remove rectal tube.  12/10: stool seems to be slowing down some.   Assessment & Plan:   Active Problems:   Syncope due to orthostatic hypotension   ESRD on hemodialysis ([place])  Syncope secondary to recurrent orthostatic hypotension -Continue midodrine as previous - added holding parameters -continue Florinef -Lower extremity stockingswith abdominal binder -Plan for hemodialysis per nephrology12/6, 12/8, 12/10.  -May need palliative care evaluation eventually if he fails to continue improvement  Lactic acidosis likely related to above -Likely due to soft blood pressure readings with no signs of  other acute infection noted  Ongoing colitis with diarrhea - stool seems to be slowing down some -Continue prednisone 30 mg daily for 1 week and then taper by 10 mg/week thereafter -Recent treatment with ciprofloxacin and Flagyl for 10 days completed -Repeat Cdiff negative -Formal consultation with GI appreciated for further evaluation -Check 5-Ht as ordered to evaluate serotonin levels and see if possible carcinoid syndrome could be a cause  ESRD on HD MWF -Appreciate nephrology evaluation for hemodialysis while inpatient, had hemodialysis 12/6, 12/8, 12/10 with minimal to no fluid removal  Chronic anemia-stable -Received 5 units of PRBCs during his last admission -Continue to hold apixaban per prior admission recommendations -No overt bleeding noted -Hemoglobin remains stable -CBC being monitored  Paroxysmal atrial fibrillation/flutter -Continue close monitoring on telemetry -Resume home amiodarone and hold metoprolol while blood pressures are soft -Asymptomatic -Hold apixaban per cardiology recommendations on prior admission  Dyslipidemia -Continue Zocor  Hypoalbuminemia -Likely mild to moderate protein calorie malnutrition -Dietitian consultation appreciated  DVT prophylaxis:SCDs Code Status:Full Family Communication:Pt prefers to call; none at bedside Disposition Plan: SNF when discharged Status is: Inpatient  Remains inpatient appropriate because:IV treatments appropriate due to intensity of illness or inability to take PO and Inpatient level of care appropriate due to severity of illness  Dispo: The patient is from:SNF Anticipated d/c is to:SNF Anticipated d/c date is:1-2days Patient currently is not medically stable to d/c.  Will plan to discharge once stool output has improved and patient less orthostatic.  GI work-up ongoing.  Ongoing hemodialysis treatments.    Consultants:  Nephrology  GI  Procedures:  See below  Antimicrobials:   None  Subjective: Stools seems to be  slowing down on new regimen, patient's BPs are improving as well.   Objective: Vitals:   11/09/20 1510 11/09/20 2022 11/10/20 0543 11/10/20 1424  BP: 126/71 (!) 149/81 (!) 146/81 (!) 166/95  Pulse: 72 68 71 78  Resp: 16 18 18 16   Temp: 98 F (36.7 C) 97.9 F (36.6 C) 98.8 F (37.1 C) 98.8 F (37.1 C)  TempSrc: Oral  Oral Oral  SpO2: 98% 99% 97% 99%  Weight: 72.2 kg     Height:        Intake/Output Summary (Last 24 hours) at 11/10/2020 1601 Last data filed at 11/10/2020 0647 Gross per 24 hour  Intake 250 ml  Output 1400 ml  Net -1150 ml   Filed Weights   11/07/20 1050 11/09/20 1125 11/09/20 1510  Weight: 71.2 kg 72.1 kg 72.2 kg    Examination:  General exam: awake, alert, NAD, Appears calm and comfortable  Respiratory system: Clear to auscultation. Respiratory effort normal. Cardiovascular system: normal S1 & S2 heard.  Gastrointestinal system: Abdomen is nondistended, soft and nontender.  Central nervous system: Alert and awake Extremities: No edema Skin: No rashes, lesions or ulcers Psychiatry: Flat affect unchanged fecal bag with liquid stool output  Data Reviewed: I have personally reviewed following labs and imaging studies  CBC: Recent Labs  Lab 11/05/20 0500 11/07/20 1047 11/08/20 0510 11/09/20 0516 11/10/20 0447  WBC 9.8 8.8 6.6 5.4 4.0  HGB 8.1* 8.0* 7.5* 8.9* 8.1*  HCT 25.4* 25.7* 23.9* 28.7* 25.6*  MCV 92.7 93.5 92.6 93.5 92.4  PLT 182 152 147* 157 478   Basic Metabolic Panel: Recent Labs  Lab 11/04/20 0702 11/05/20 0500 11/07/20 0500 11/08/20 0510 11/09/20 1525  NA 130* 128* 130* 131* 129*  K 3.5 3.1* 3.3* 3.4* 3.6  CL 99 97* 98 99 97*  CO2 24 23 24 24  21*  GLUCOSE 265* 235* 222* 185* 305*  BUN 46* 55* 42* 30* 41*  CREATININE 2.90* 3.50* 3.09* 2.57* 2.93*  CALCIUM 7.5* 7.3* 7.3* 7.1* 7.1*  MG  --  1.7  1.6*  --   --   PHOS  --   --   --  2.2* 2.8   GFR: Estimated Creatinine Clearance: 24 mL/min (A) (by C-G formula based on SCr of 2.93 mg/dL (H)). Liver Function Tests: Recent Labs  Lab 11/08/20 0510 11/09/20 1525  ALBUMIN 2.0* 2.1*   No results for input(s): LIPASE, AMYLASE in the last 168 hours. No results for input(s): AMMONIA in the last 168 hours. Coagulation Profile: No results for input(s): INR, PROTIME in the last 168 hours. Cardiac Enzymes: No results for input(s): CKTOTAL, CKMB, CKMBINDEX, TROPONINI in the last 168 hours. BNP (last 3 results) No results for input(s): PROBNP in the last 8760 hours. HbA1C: No results for input(s): HGBA1C in the last 72 hours. CBG: No results for input(s): GLUCAP in the last 168 hours. Lipid Profile: No results for input(s): CHOL, HDL, LDLCALC, TRIG, CHOLHDL, LDLDIRECT in the last 72 hours. Thyroid Function Tests: No results for input(s): TSH, T4TOTAL, FREET4, T3FREE, THYROIDAB in the last 72 hours. Anemia Panel: Recent Labs    11/08/20 0510 11/09/20 0516  VITAMINB12  --  894  FOLATE  --  68.8  FERRITIN  --  650*  TIBC  --  197*  IRON  --  26*  RETICCTPCT 2.6  --    Sepsis Labs: No results for input(s): PROCALCITON, LATICACIDVEN in the last 168 hours.  Recent Results (from the past 240 hour(s))  SARS Coronavirus  2 by RT PCR (hospital order, performed in [facility] hospital lab) Nasopharyngeal Nasopharyngeal Swab     Status: None   Collection Time: 11/01/20  9:11 AM   Specimen: Nasopharyngeal Swab  Result Value Ref Range Status   SARS Coronavirus 2 NEGATIVE NEGATIVE Final    Comment: (NOTE) SARS-CoV-2 target nucleic acids are NOT DETECTED.  The SARS-CoV-2 RNA is generally detectable in upper and lower respiratory specimens during the acute phase of infection. The lowest concentration of SARS-CoV-2 viral copies this assay can detect is 250 copies / mL. A negative result does not preclude SARS-CoV-2 infection and should  not be used as the sole basis for treatment or other patient management decisions.  A negative result may occur with improper specimen collection / handling, submission of specimen other than nasopharyngeal swab, presence of viral mutation(s) within the areas targeted by this assay, and inadequate number of viral copies (<250 copies / mL). A negative result must be combined with clinical observations, patient history, and epidemiological information.  Fact Sheet for Patients:   StrictlyIdeas.no  Fact Sheet for Healthcare Providers: BankingDealers.co.za  This test is not yet approved or  cleared by the [place] FDA and has been authorized for detection and/or diagnosis of SARS-CoV-2 by FDA under an Emergency Use Authorization (EUA).  This EUA will remain in effect (meaning this test can be used) for the duration of the COVID-19 declaration under Section 564(b)(1) of the Act, 21 U.S.C. section 360bbb-3(b)(1), unless the authorization is terminated or revoked sooner.  Performed at [facility], [address], [place], [place] [address]   MRSA PCR Screening     Status: None   Collection Time: 11/02/20  2:48 PM   Specimen: Nasopharyngeal  Result Value Ref Range Status   MRSA by PCR NEGATIVE NEGATIVE Final    Comment:        The GeneXpert MRSA Assay (FDA approved for NASAL specimens only), is one component of a comprehensive MRSA colonization surveillance program. It is not intended to diagnose MRSA infection nor to guide or monitor treatment for MRSA infections. Performed at [facility], [address], [place], [place] [address]   C Difficile Quick Screen w PCR reflex     Status: None   Collection Time: 11/02/20  3:20 PM   Specimen: STOOL  Result Value Ref Range Status   C Diff antigen NEGATIVE NEGATIVE Final   C Diff toxin NEGATIVE NEGATIVE Final   C Diff interpretation No C. difficile detected.  Final    Comment:  Performed at [facility], [address], [place], [place] [address]  Resp Panel by RT-PCR (Flu A&B, Covid) Nasopharyngeal Swab     Status: None   Collection Time: 11/05/20 10:41 AM   Specimen: Nasopharyngeal Swab; Nasopharyngeal(NP) swabs in vial transport medium  Result Value Ref Range Status   SARS Coronavirus 2 by RT PCR NEGATIVE NEGATIVE Final    Comment: (NOTE) SARS-CoV-2 target nucleic acids are NOT DETECTED.  The SARS-CoV-2 RNA is generally detectable in upper respiratory specimens during the acute phase of infection. The lowest concentration of SARS-CoV-2 viral copies this assay can detect is 138 copies/mL. A negative result does not preclude SARS-Cov-2 infection and should not be used as the sole basis for treatment or other patient management decisions. A negative result may occur with  improper specimen collection/handling, submission of specimen other than nasopharyngeal swab, presence of viral mutation(s) within the areas targeted by this assay, and inadequate number of viral copies(<138 copies/mL). A negative result  must be combined with clinical observations, patient history, and epidemiological information. The expected result is Negative.  Fact Sheet for Patients:  EntrepreneurPulse.com.au  Fact Sheet for Healthcare Providers:  IncredibleEmployment.be  This test is no t yet approved or cleared by the [place] FDA and  has been authorized for detection and/or diagnosis of SARS-CoV-2 by FDA under an Emergency Use Authorization (EUA). This EUA will remain  in effect (meaning this test can be used) for the duration of the COVID-19 declaration under Section 564(b)(1) of the Act, 21 U.S.C.section 360bbb-3(b)(1), unless the authorization is terminated  or revoked sooner.       Influenza A by PCR NEGATIVE NEGATIVE Final   Influenza B by PCR NEGATIVE NEGATIVE Final    Comment: (NOTE) The Xpert Xpress SARS-CoV-2/FLU/RSV plus  assay is intended as an aid in the diagnosis of influenza from Nasopharyngeal swab specimens and should not be used as a sole basis for treatment. Nasal washings and aspirates are unacceptable for Xpert Xpress SARS-CoV-2/FLU/RSV testing.  Fact Sheet for Patients: EntrepreneurPulse.com.au  Fact Sheet for Healthcare Providers: IncredibleEmployment.be  This test is not yet approved or cleared by the [place] FDA and has been authorized for detection and/or diagnosis of SARS-CoV-2 by FDA under an Emergency Use Authorization (EUA). This EUA will remain in effect (meaning this test can be used) for the duration of the COVID-19 declaration under Section 564(b)(1) of the Act, 21 U.S.C. section 360bbb-3(b)(1), unless the authorization is terminated or revoked.  Performed at [facility], [address] [place], [place] [address]   OVA + PARASITE EXAM     Status: None   Collection Time: 11/05/20  3:44 PM   Specimen: Per Rectum; Stool  Result Value Ref Range Status   OVA + PARASITE EXAM Final report  Final    Comment: (NOTE) These results were obtained using wet preparation(s) and trichrome stained smear. This test does not include testing for Cryptosporidium parvum, Cyclospora, or Microsporidia. Performed At: [place], [place] [id number] Truddie Coco MD JQ:[id number]    Source of Sample STOOL  Final    Comment: Performed at [facility], [address], [place], [place] [address]  Gastrointestinal Panel by PCR , Stool     Status: None   Collection Time: 11/06/20  1:25 PM   Specimen: Stool  Result Value Ref Range Status   Campylobacter species NOT DETECTED NOT DETECTED Final   Plesimonas shigelloides NOT DETECTED NOT DETECTED Final   Salmonella species NOT DETECTED NOT DETECTED Final   Yersinia enterocolitica NOT DETECTED NOT DETECTED Final   Vibrio species NOT DETECTED NOT DETECTED Final   Vibrio  cholerae NOT DETECTED NOT DETECTED Final   Enteroaggregative E coli (EAEC) NOT DETECTED NOT DETECTED Final   Enteropathogenic E coli (EPEC) NOT DETECTED NOT DETECTED Final   Enterotoxigenic E coli (ETEC) NOT DETECTED NOT DETECTED Final   Shiga like toxin producing E coli (STEC) NOT DETECTED NOT DETECTED Final   Shigella/Enteroinvasive E coli (EIEC) NOT DETECTED NOT DETECTED Final   Cryptosporidium NOT DETECTED NOT DETECTED Final   Cyclospora cayetanensis NOT DETECTED NOT DETECTED Final   Entamoeba histolytica NOT DETECTED NOT DETECTED Final   Giardia lamblia NOT DETECTED NOT DETECTED Final   Adenovirus F40/41 NOT DETECTED NOT DETECTED Final   Astrovirus NOT DETECTED NOT DETECTED Final   Norovirus GI/GII NOT DETECTED NOT DETECTED Final   Rotavirus A NOT DETECTED NOT DETECTED Final   Sapovirus (I, II, IV, and V) NOT DETECTED  NOT DETECTED Final    Comment: Performed at [facility], [address], [place], [place] [address]    Radiology Studies: No results found. Scheduled Meds: . (feeding supplement) PROSource Plus  30 mL Oral BID BM  . amiodarone  200 mg Oral Daily  . Chlorhexidine Gluconate Cloth  6 each Topical Q0600  . Chlorhexidine Gluconate Cloth  6 each Topical Q0600  . colestipol  2 g Oral Daily  . darbepoetin (ARANESP) injection - DIALYSIS  100 mcg Intravenous Q Wed-HD  . famotidine  20 mg Oral Daily  . feeding supplement  237 mL Oral BID BM  . fludrocortisone  0.1 mg Oral Daily  . lipase/protease/amylase  36,000 Units Oral With snacks  . lipase/protease/amylase  72,000 Units Oral TID WC  . loperamide  2 mg Oral TID  . metoprolol tartrate  12.5 mg Oral BID  . midodrine  10 mg Oral TID with meals  . multivitamin  1 tablet Oral QHS  . predniSONE  20 mg Oral Q breakfast   Followed by  . [START ON 11/13/2020] predniSONE  10 mg Oral Q breakfast  . simvastatin  20 mg Oral QPM  . sodium chloride flush  3 mL Intravenous Q12H   Continuous Infusions: . sodium  chloride    . sodium chloride    . sodium chloride       LOS: 8 days   Time spent: 35 minutes  Clanford Wynetta Emery, MD How to contact the [facility] Attending or Consulting provider [place] or covering provider during after hours [address], for this patient?  1. Check the care team in [facility] and look for a) attending/consulting [facility] provider listed and b) the [facility] team listed 2. Log into [email or web address] and use [place] universal password to access. If you do not have the password, please contact the hospital operator. 3. Locate the [facility] provider you are looking for under [facility] and page to a number that you can be directly reached. 4. If you still have difficulty reaching the provider, please page the [facility] (Director on Call) for the Hospitalists listed on amion for assistance.   If 7PM-7AM, please contact night-coverage [email or web address] 11/10/2020, 4:01 PM

## 2020-11-10 NOTE — Plan of Care (Signed)

## 2020-11-10 NOTE — Progress Notes (Signed)
States he is eating well.  Denies abdominal pain.  He still is having nonformed stool via rectal tube Frequency may be decreasing.  Vital signs in last 24 hours: Temp:  [97.9 F (36.6 C)-98.8 F (37.1 C)] 98.8 F (37.1 C) (12/11 0543) Pulse Rate:  [64-83] 71 (12/11 0543) Resp:  [16-18] 18 (12/11 0543) BP: (116-159)/(55-84) 146/81 (12/11 0543) SpO2:  [97 %-99 %] 97 % (12/11 0543) Weight:  [72.2 kg] 72.2 kg (12/10 1510) Last BM Date: 11/10/20 General:   Alert,   Abdomen: Nondistended.  Positive bowel sounds.  Soft nontender without appreciable mass or organomegaly. .    Intake/Output from previous day: 12/10 0701 - 12/11 0700 In: 250 [P.O.:250] Out: 1400 [Urine:1400] Intake/Output this shift: No intake/output data recorded.  Lab Results: Recent Labs    11/08/20 0510 11/09/20 0516 11/10/20 0447  WBC 6.6 5.4 4.0  HGB 7.5* 8.9* 8.1*  HCT 23.9* 28.7* 25.6*  PLT 147* 157 161   BMET Recent Labs    11/08/20 0510 11/09/20 1525  NA 131* 129*  K 3.4* 3.6  CL 99 97*  CO2 24 21*  GLUCOSE 185* 305*  BUN 30* 41*  CREATININE 2.57* 2.93*  CALCIUM 7.1* 7.1*   LFT Recent Labs    11/09/20 1525  ALBUMIN 2.1*    Impression: 69 year old gentleman with orthostatic hypotension, persisting diarrhea and nonspecific colitis on CT/limited colonoscopy. Stool frequency seems to be declining but is persistent. He has had an extensive work-up as to the cause of diarrhea. Stool electrolytes and serum serotonin level remain pending.  Recommendations  Maintain hydration  Continue Creon, Colestid, Imodium and prednisone taper.  Follow-up on pending studies  Eventual high-quality optical colonoscopy  He may end up being discharged with a rectal tube.

## 2020-11-11 LAB — SARS CORONAVIRUS 2 BY RT PCR (HOSPITAL ORDER, PERFORMED IN ~~LOC~~ HOSPITAL LAB): SARS Coronavirus 2: NEGATIVE

## 2020-11-11 NOTE — Plan of Care (Signed)

## 2020-11-11 NOTE — Progress Notes (Signed)
PROGRESS NOTE  [name]  UKG:[id number] DOB: [birth year] DOA: 11/02/2020 PCP: Patient, No Pcp Per  Brief Narrative:  Per HPI: [name] a 69 y.o.malewith medical history significant fortype 2 diabetes, CKD stage V recently initiated on hemodialysis MWF, atrial fibrillation/flutter on amiodarone and metoprolol and Eliquis, recurrent orthostatic hypotension, dyslipidemia, and recent discharge on 12/2 for colitis with associated adynamic ileus and acute blood loss anemia who presented back to the ED today after he was noted to have an episode of syncope while getting up to go to hemodialysis from his SNF. On arrival to the ED his systolic blood pressures were in the 70 mmHg range. He is noted to have some diarrhea, but he has recently completed course of ciprofloxacin and Flagyl by 11/23. He was noted to have nonspecific colitis on pathology after recent colonoscopy on 11/17 and was started on prednisone taper which he continues to take at this time.  -Patient continues to remain orthostatic and therefore continues to blame blood.  This is complicated by has ongoing colitis on high liquid stool output.  GI reconsulted on 12/6 to continue further evaluation.  Creon and Imodium started 3 times daily on 12/6 however patient continues to have copious stool output, have been unable to remove rectal tube.  12/10: stool seems to be slowing down some.   Assessment & Plan:   Active Problems:   Syncope due to orthostatic hypotension   ESRD on hemodialysis ([facility])  Syncope secondary to recurrent orthostatic hypotension -Continue midodrine as previous - added holding parameters -continue Florinef -Lower extremity stockingswith abdominal binder -Plan for hemodialysis per nephrology12/6, 12/8, 12/10.  -May need palliative care evaluation eventually if he fails to continue improvement  Lactic acidosis likely related to above -Likely due to soft blood pressure readings with no signs of other  acute infection noted  Ongoing colitis with diarrhea - stool seems to be slowing down some -Continue prednisone taper by 10 mg/week -Recent treatment with ciprofloxacin and Flagyl for 10 days completed -Repeat Cdiff negative -Formal consultation with GI appreciated for further evaluation  ESRD on HD MWF -Appreciate nephrology evaluation for hemodialysis while inpatient, had hemodialysis 12/6, 12/8, 12/10 with minimal to no fluid removal  Chronic anemia-stable -Received 5 units of PRBCs during his last admission -Continue to hold apixaban per prior admission recommendations -No overt bleeding noted -Hemoglobin remains stable -CBC being monitored  Paroxysmal atrial fibrillation/flutter -Continue close monitoring on telemetry -Resumed home amiodarone and metoprolol -Hold apixaban per cardiology recommendations on prior admission  Dyslipidemia -Continue Zocor  Hypoalbuminemia -Likely mild to moderate protein calorie malnutrition -Dietitian consultation appreciated  DVT prophylaxis:SCDs Code Status:Full Family Communication:Pt prefers to call; none at bedside Disposition Plan: SNF when medically stabilized Status is: Inpatient  Remains inpatient appropriate because:IV treatments appropriate due to intensity of illness or inability to take PO and Inpatient level of care appropriate due to severity of illness  Dispo: The patient is from:SNF Anticipated d/c is to:SNF Anticipated d/c date is:1-2days Patient currently is not medically stable to d/c.  Will plan to discharge once stool output has improved and patient less orthostatic.  GI work-up ongoing.  Ongoing hemodialysis treatments.   Consultants:  Nephrology  GI  Procedures:  See below  Antimicrobials:   None  Subjective: Pt reports stools slowing down asking about rectal tube removal  Objective: Vitals:   11/10/20 1424 11/10/20 2141 11/11/20 0500  11/11/20 0513  BP: (!) 166/95 (!) 149/90  (!) 158/91  Pulse: 78 80  74  Resp:  16 16  18   Temp: 98.8 F (37.1 C) 98 F (36.7 C)  98.5 F (36.9 C)  TempSrc: Oral   Oral  SpO2: 99% 99%  99%  Weight:   72.2 kg   Height:        Intake/Output Summary (Last 24 hours) at 11/11/2020 0926 Last data filed at 11/11/2020 0100 Gross per 24 hour  Intake 240 ml  Output 1925 ml  Net -1685 ml   Filed Weights   11/09/20 1125 11/09/20 1510 11/11/20 0500  Weight: 72.1 kg 72.2 kg 72.2 kg    Examination:  General exam: awake, alert, NAD, Appears calm and comfortable  Respiratory system: Clear to auscultation. Respiratory effort normal. Cardiovascular system: normal S1 & S2 heard.  Gastrointestinal system: Abdomen is nondistended, soft and nontender.  Central nervous system: Alert and awake Extremities: No edema Skin: No rashes, lesions or ulcers Psychiatry: Flat affect unchanged fecal bag with liquid stool output, amount seems to be slowing  Data Reviewed: I have personally reviewed following labs and imaging studies  CBC: Recent Labs  Lab 11/05/20 0500 11/07/20 1047 11/08/20 0510 11/09/20 0516 11/10/20 0447  WBC 9.8 8.8 6.6 5.4 4.0  HGB 8.1* 8.0* 7.5* 8.9* 8.1*  HCT 25.4* 25.7* 23.9* 28.7* 25.6*  MCV 92.7 93.5 92.6 93.5 92.4  PLT 182 152 147* 157 253   Basic Metabolic Panel: Recent Labs  Lab 11/05/20 0500 11/07/20 0500 11/08/20 0510 11/09/20 1525  NA 128* 130* 131* 129*  K 3.1* 3.3* 3.4* 3.6  CL 97* 98 99 97*  CO2 23 24 24  21*  GLUCOSE 235* 222* 185* 305*  BUN 55* 42* 30* 41*  CREATININE 3.50* 3.09* 2.57* 2.93*  CALCIUM 7.3* 7.3* 7.1* 7.1*  MG 1.7 1.6*  --   --   PHOS  --   --  2.2* 2.8   GFR: Estimated Creatinine Clearance: 24 mL/min (A) (by C-G formula based on SCr of 2.93 mg/dL (H)). Liver Function Tests: Recent Labs  Lab 11/08/20 0510 11/09/20 1525  ALBUMIN 2.0* 2.1*   No results for input(s): LIPASE, AMYLASE in the last 168 hours. No results for  input(s): AMMONIA in the last 168 hours. Coagulation Profile: No results for input(s): INR, PROTIME in the last 168 hours. Cardiac Enzymes: No results for input(s): CKTOTAL, CKMB, CKMBINDEX, TROPONINI in the last 168 hours. BNP (last 3 results) No results for input(s): PROBNP in the last 8760 hours. HbA1C: No results for input(s): HGBA1C in the last 72 hours. CBG: No results for input(s): GLUCAP in the last 168 hours. Lipid Profile: No results for input(s): CHOL, HDL, LDLCALC, TRIG, CHOLHDL, LDLDIRECT in the last 72 hours. Thyroid Function Tests: No results for input(s): TSH, T4TOTAL, FREET4, T3FREE, THYROIDAB in the last 72 hours. Anemia Panel: Recent Labs    11/09/20 0516  VITAMINB12 894  FOLATE 68.8  FERRITIN 650*  TIBC 197*  IRON 26*   Sepsis Labs: No results for input(s): PROCALCITON, LATICACIDVEN in the last 168 hours.  Recent Results (from the past 240 hour(s))  MRSA PCR Screening     Status: None   Collection Time: 11/02/20  2:48 PM   Specimen: Nasopharyngeal  Result Value Ref Range Status   MRSA by PCR NEGATIVE NEGATIVE Final    Comment:        The GeneXpert MRSA Assay (FDA approved for NASAL specimens only), is one component of a comprehensive MRSA colonization surveillance program. It is not intended to diagnose MRSA infection nor to guide or monitor treatment for  MRSA infections. Performed at [facility], [address], [place], [place] [address]   C Difficile Quick Screen w PCR reflex     Status: None   Collection Time: 11/02/20  3:20 PM   Specimen: STOOL  Result Value Ref Range Status   C Diff antigen NEGATIVE NEGATIVE Final   C Diff toxin NEGATIVE NEGATIVE Final   C Diff interpretation No C. difficile detected.  Final    Comment: Performed at [facility], [address] [place], [place] [address]  Resp Panel by RT-PCR (Flu A&B, Covid) Nasopharyngeal Swab     Status: None   Collection Time: 11/05/20 10:41 AM   Specimen: Nasopharyngeal Swab;  Nasopharyngeal(NP) swabs in vial transport medium  Result Value Ref Range Status   SARS Coronavirus 2 by RT PCR NEGATIVE NEGATIVE Final    Comment: (NOTE) SARS-CoV-2 target nucleic acids are NOT DETECTED.  The SARS-CoV-2 RNA is generally detectable in upper respiratory specimens during the acute phase of infection. The lowest concentration of SARS-CoV-2 viral copies this assay can detect is 138 copies/mL. A negative result does not preclude SARS-Cov-2 infection and should not be used as the sole basis for treatment or other patient management decisions. A negative result may occur with  improper specimen collection/handling, submission of specimen other than nasopharyngeal swab, presence of viral mutation(s) within the areas targeted by this assay, and inadequate number of viral copies(<138 copies/mL). A negative result must be combined with clinical observations, patient history, and epidemiological information. The expected result is Negative.  Fact Sheet for Patients:  EntrepreneurPulse.com.au  Fact Sheet for Healthcare Providers:  IncredibleEmployment.be  This test is no t yet approved or cleared by the [place] FDA and  has been authorized for detection and/or diagnosis of SARS-CoV-2 by FDA under an Emergency Use Authorization (EUA). This EUA will remain  in effect (meaning this test can be used) for the duration of the COVID-19 declaration under Section 564(b)(1) of the Act, 21 U.S.C.section 360bbb-3(b)(1), unless the authorization is terminated  or revoked sooner.       Influenza A by PCR NEGATIVE NEGATIVE Final   Influenza B by PCR NEGATIVE NEGATIVE Final    Comment: (NOTE) The Xpert Xpress SARS-CoV-2/FLU/RSV plus assay is intended as an aid in the diagnosis of influenza from Nasopharyngeal swab specimens and should not be used as a sole basis for treatment. Nasal washings and aspirates are unacceptable for Xpert Xpress  SARS-CoV-2/FLU/RSV testing.  Fact Sheet for Patients: EntrepreneurPulse.com.au  Fact Sheet for Healthcare Providers: IncredibleEmployment.be  This test is not yet approved or cleared by the [place] FDA and has been authorized for detection and/or diagnosis of SARS-CoV-2 by FDA under an Emergency Use Authorization (EUA). This EUA will remain in effect (meaning this test can be used) for the duration of the COVID-19 declaration under Section 564(b)(1) of the Act, 21 U.S.C. section 360bbb-3(b)(1), unless the authorization is terminated or revoked.  Performed at [facility], [address] [place], [place] [address]   OVA + PARASITE EXAM     Status: None   Collection Time: 11/05/20  3:44 PM   Specimen: Per Rectum; Stool  Result Value Ref Range Status   OVA + PARASITE EXAM Final report  Final    Comment: (NOTE) These results were obtained using wet preparation(s) and trichrome stained smear. This test does not include testing for Cryptosporidium parvum, Cyclospora, or Microsporidia. Performed At: [facility] [place], [place] [id number] Truddie Coco MD YI:[id number]  Source of Sample STOOL  Final    Comment: Performed at [facility], [address], [place], [place] [address]  Gastrointestinal Panel by PCR , Stool     Status: None   Collection Time: 11/06/20  1:25 PM   Specimen: Stool  Result Value Ref Range Status   Campylobacter species NOT DETECTED NOT DETECTED Final   Plesimonas shigelloides NOT DETECTED NOT DETECTED Final   Salmonella species NOT DETECTED NOT DETECTED Final   Yersinia enterocolitica NOT DETECTED NOT DETECTED Final   Vibrio species NOT DETECTED NOT DETECTED Final   Vibrio cholerae NOT DETECTED NOT DETECTED Final   Enteroaggregative E coli (EAEC) NOT DETECTED NOT DETECTED Final   Enteropathogenic E coli (EPEC) NOT DETECTED NOT DETECTED Final   Enterotoxigenic E coli (ETEC) NOT DETECTED  NOT DETECTED Final   Shiga like toxin producing E coli (STEC) NOT DETECTED NOT DETECTED Final   Shigella/Enteroinvasive E coli (EIEC) NOT DETECTED NOT DETECTED Final   Cryptosporidium NOT DETECTED NOT DETECTED Final   Cyclospora cayetanensis NOT DETECTED NOT DETECTED Final   Entamoeba histolytica NOT DETECTED NOT DETECTED Final   Giardia lamblia NOT DETECTED NOT DETECTED Final   Adenovirus F40/41 NOT DETECTED NOT DETECTED Final   Astrovirus NOT DETECTED NOT DETECTED Final   Norovirus GI/GII NOT DETECTED NOT DETECTED Final   Rotavirus A NOT DETECTED NOT DETECTED Final   Sapovirus (I, II, IV, and V) NOT DETECTED NOT DETECTED Final    Comment: Performed at [facility], [address], [place], [place] [address]    Radiology Studies: No results found. Scheduled Meds: . (feeding supplement) PROSource Plus  30 mL Oral BID BM  . amiodarone  200 mg Oral Daily  . Chlorhexidine Gluconate Cloth  6 each Topical Q0600  . Chlorhexidine Gluconate Cloth  6 each Topical Q0600  . colestipol  2 g Oral Daily  . darbepoetin (ARANESP) injection - DIALYSIS  100 mcg Intravenous Q Wed-HD  . famotidine  20 mg Oral Daily  . feeding supplement  237 mL Oral BID BM  . fludrocortisone  0.1 mg Oral Daily  . lipase/protease/amylase  36,000 Units Oral With snacks  . lipase/protease/amylase  72,000 Units Oral TID WC  . loperamide  2 mg Oral TID  . metoprolol tartrate  12.5 mg Oral BID  . midodrine  10 mg Oral TID with meals  . multivitamin  1 tablet Oral QHS  . predniSONE  20 mg Oral Q breakfast   Followed by  . [START ON 11/13/2020] predniSONE  10 mg Oral Q breakfast  . simvastatin  20 mg Oral QPM  . sodium chloride flush  3 mL Intravenous Q12H   Continuous Infusions: . sodium chloride    . sodium chloride    . sodium chloride       LOS: 9 days   Time spent: 35 minutes  Shterna Laramee Wynetta Emery, MD How to contact the [facility] Attending or Consulting provider [place] or covering provider during after  hours [place], for this patient?  1. Check the care team in [facility] and look for a) attending/consulting TRH provider listed and b) the [facility] team listed 2. Log into [email or web address] and use [place]'s universal password to access. If you do not have the password, please contact the hospital operator. 3. Locate the [facility] provider you are looking for under [facility] and page to a number that you can be directly reached. 4. If you still have difficulty reaching the provider, please page the [facility] (  Director on Call) for the Hospitalists listed on amion for assistance.   If 7PM-7AM, please contact night-coverage [email or web address] 11/11/2020, 9:26 AM

## 2020-11-11 NOTE — TOC Progression Note (Signed)
Transition of Care [facility]) - Progression Note    Patient Details  Name: [name] MRN: [id number] Date of Birth: 1951-11-08  [facility] CM/SW Contact  Shade Flood, LCSW Phone Number: 11/11/2020, 10:45 AM  Clinical Narrative:     TOC following. MD notes indicate possible dc in the next 1-2 days. Plan remains for return to [facility] for rehab. Notified MD of need for rapid covid test day of dc.  TOC will follow.  Expected Discharge Plan: Skilled Nursing Facility Barriers to Discharge: Continued Medical Work up  Expected Discharge Plan and Services Expected Discharge Plan: [place]                                               Social Determinants of Health (SDOH) Interventions    Readmission Risk Interventions No flowsheet data found.

## 2020-11-12 LAB — RENAL FUNCTION PANEL
Albumin: 2.3 g/dL — ABNORMAL LOW (ref 3.5–5.0)
Anion gap: 12 (ref 5–15)
BUN: 43 mg/dL — ABNORMAL HIGH (ref 8–23)
CO2: 20 mmol/L — ABNORMAL LOW (ref 22–32)
Calcium: 7.6 mg/dL — ABNORMAL LOW (ref 8.9–10.3)
Chloride: 98 mmol/L (ref 98–111)
Creatinine, Ser: 3.09 mg/dL — ABNORMAL HIGH (ref 0.61–1.24)
GFR, Estimated: 21 mL/min — ABNORMAL LOW (ref >60)
Glucose, Bld: 105 mg/dL — ABNORMAL HIGH (ref 70–99)
Phosphorus: 3.7 mg/dL (ref 2.5–4.6)
Potassium: 4 mmol/L (ref 3.5–5.1)
Sodium: 130 mmol/L — ABNORMAL LOW (ref 135–145)

## 2020-11-12 MED ORDER — CALCITRIOL 0.25 MCG PO CAPS
0.5000 ug | ORAL_CAPSULE | Freq: Every day | ORAL | Status: DC
  Administered 2020-11-12 – 2020-12-11 (×29): 0.5 ug via ORAL
  Filled 2020-11-12 (×30): qty 2

## 2020-11-12 NOTE — Procedures (Signed)
   HEMODIALYSIS TREATMENT NOTE:  Uneventful 3 hour heparin-free HD completed via [facility] TDC.  No ultrafiltration.  Hemodynamically stable.  All blood was returned.  Declined standing post BP d/t rectal tube and foley catheter.  No changes from pre-dialysis assessment.  Rockwell Alexandria, RN

## 2020-11-12 NOTE — Progress Notes (Signed)
Patient ID: ABHIJOT STRAUGHTER, male   DOB: [birth year], 69 y.o.   MRN: [id number] S: Complaining of urinary retention since yesterday afternoon. O:BP (!) 166/97 (BP Location: Left Arm)   Pulse 77   Temp 97.6 F (36.4 C) (Oral)   Resp 20   Ht 6\' 2"  (1.88 m)   Wt 72.2 kg   SpO2 99%   BMI 20.44 kg/m   Intake/Output Summary (Last 24 hours) at 11/12/2020 0931 Last data filed at 11/12/2020 0100 Gross per 24 hour  Intake 480 ml  Output 100 ml  Net 380 ml   Intake/Output: I/O last 3 completed shifts: In: 480 [P.O.:480] Out: 800 [Urine:700; Stool:100]  Intake/Output this shift:  No intake/output data recorded. Weight change:  Gen: frail, elderly WM in mild distress CVS: RRR Resp: CTA Abd: +BS, soft, distended bladder with some discomfort upon palpation Ext: trace ankle edema  Recent Labs  Lab 11/07/20 0500 11/08/20 0510 11/09/20 1525 11/12/20 0606  NA 130* 131* 129* 130*  K 3.3* 3.4* 3.6 4.0  CL 98 99 97* 98  CO2 24 24 21* 20*  GLUCOSE 222* 185* 305* 105*  BUN 42* 30* 41* 43*  CREATININE 3.09* 2.57* 2.93* 3.09*  ALBUMIN  --  2.0* 2.1* 2.3*  CALCIUM 7.3* 7.1* 7.1* 7.6*  PHOS  --  2.2* 2.8 3.7   Liver Function Tests: Recent Labs  Lab 11/08/20 0510 11/09/20 1525 11/12/20 0606  ALBUMIN 2.0* 2.1* 2.3*   No results for input(s): LIPASE, AMYLASE in the last 168 hours. No results for input(s): AMMONIA in the last 168 hours. CBC: Recent Labs  Lab 11/07/20 1047 11/08/20 0510 11/09/20 0516 11/10/20 0447  WBC 8.8 6.6 5.4 4.0  HGB 8.0* 7.5* 8.9* 8.1*  HCT 25.7* 23.9* 28.7* 25.6*  MCV 93.5 92.6 93.5 92.4  PLT 152 147* 157 161   Cardiac Enzymes: No results for input(s): CKTOTAL, CKMB, CKMBINDEX, TROPONINI in the last 168 hours. CBG: No results for input(s): GLUCAP in the last 168 hours.  Iron Studies: No results for input(s): IRON, TIBC, TRANSFERRIN, FERRITIN in the last 72 hours. Studies/Results: No results found. . (feeding supplement) PROSource Plus  30 mL Oral BID  BM  . amiodarone  200 mg Oral Daily  . Chlorhexidine Gluconate Cloth  6 each Topical Q0600  . Chlorhexidine Gluconate Cloth  6 each Topical Q0600  . colestipol  2 g Oral Daily  . darbepoetin (ARANESP) injection - DIALYSIS  100 mcg Intravenous Q Wed-HD  . famotidine  20 mg Oral Daily  . feeding supplement  237 mL Oral BID BM  . fludrocortisone  0.1 mg Oral Daily  . lipase/protease/amylase  36,000 Units Oral With snacks  . lipase/protease/amylase  72,000 Units Oral TID WC  . loperamide  2 mg Oral TID  . metoprolol tartrate  12.5 mg Oral BID  . midodrine  10 mg Oral TID with meals  . multivitamin  1 tablet Oral QHS  . predniSONE  20 mg Oral Q breakfast   Followed by  . [START ON 11/13/2020] predniSONE  10 mg Oral Q breakfast  . simvastatin  20 mg Oral QPM  . sodium chloride flush  3 mL Intravenous Q12H    BMET    Component Value Date/Time   NA 130 (L) 11/12/2020 0606   K 4.0 11/12/2020 0606   CL 98 11/12/2020 0606   CO2 20 (L) 11/12/2020 0606   GLUCOSE 105 (H) 11/12/2020 0606   BUN 43 (H) 11/12/2020 0606   CREATININE 3.09 (  H) 11/12/2020 0606   CALCIUM 7.6 (L) 11/12/2020 0606   GFRNONAA 21 (L) 11/12/2020 0606   GFRAA 19 (L) 06/15/2018 0407   CBC    Component Value Date/Time   WBC 4.0 11/10/2020 0447   RBC 2.77 (L) 11/10/2020 0447   HGB 8.1 (L) 11/10/2020 0447   HCT 25.6 (L) 11/10/2020 0447   PLT 161 11/10/2020 0447   MCV 92.4 11/10/2020 0447   MCH 29.2 11/10/2020 0447   MCHC 31.6 11/10/2020 0447   RDW 20.1 (H) 11/10/2020 0447   LYMPHSABS 0.5 (L) 11/02/2020 1209   MONOABS 1.2 (H) 11/02/2020 1209   EOSABS 0.0 11/02/2020 1209   BASOSABS 0.1 11/02/2020 1209    Assessment/Plan:  1. Recurrent Syncope/hypotension; sig orthostasis:On TID Midodrine. HD with minimal UF due to good UOP.  1. Recommend starting fludrocortisone 0.1 mg each morning and increase as needed. 2. Also will need abdominal binder and compression stockings  3. Would also consider droxydopa or  pyridostigmine if fludrocortisone does not help (stopping florinef and midodrine). 4. Also need to control diarrhea which is likely contributing as well. 5. Keep even with HD. 6. Continue to follow orthostatics and ambulate with PT as tolerated.  2. Colitis- GI had been following and he completed a course of cipro/flagyl. He had colonoscopy on 10/17/20 which was a poor prep and incomplete exam but did note blood in the rectum and sigmoid colon, in the descending colon and at the splenic flexure; colitis involving descending and sigmoid colons seen. Biopsies performed. Per GI and surgery.  His diarrhea persists and now has a flexseal.  Currently on prednisone taper, creon, colestid, and imodium.  3. Urinary retention- this is new.  Pt with significant UOP.  Will likely need foley catheter and Urology evaluation.  Unclear if this is drug related or due to BPH 4. [facility]. HD off schedule today but will get back on Monday. 5. Anemia:of CKD- s/p blood transfusion. No heparin with HD. GI on board, colonoscopyfindings as above. Rec 1u prbc 11/22 1. Transfuse as needed to keep Hgb >7 2. On Aranesp weekly. 6. 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 Resume calcitriol 0.61mcg daily (was on this as an outpatient). Calcium corrects to 8.7. 7. Severe protein malnutrition:per primary, push protein 8. H/o Hypertension:see #1 9. Vascular access- will need to f/u with Dr. Donnetta Hutching once stable for discharge. 10. A fib- on amiodarone and eliquis (off A/C as of right now) 11. Hyponatremia. Monitor 12. Disposition- pending ability to safely ambulate before discharge.  Donetta Potts, MD [name] [phone]

## 2020-11-12 NOTE — Progress Notes (Signed)
    Subjective: Passing more gas now. Air in rectal tube bag. Stool frequency slowing down per nursing staff. Brown non-formed stool in bag.   Objective: Vital signs in last 24 hours: Temp:  [97.6 F (36.4 C)-98.2 F (36.8 C)] 98.2 F (36.8 C) (12/13 0957) Pulse Rate:  [77-89] 89 (12/13 0957) Resp:  [18-20] 18 (12/13 0957) BP: (97-166)/(61-97) 97/61 (12/13 0957) SpO2:  [99 %] 99 % (12/13 0957) Last BM Date: 11/11/20 General:   Alert and oriented, pleasant Head:  Normocephalic and atraumatic. Eyes:  No icterus, sclera clear. Conjuctiva pink.  Abdomen:  Bowel sounds present, soft, non-tender, non-distended. No HSM or hernias noted. No rebound or guarding. No masses appreciated  Extremities:  Without edema. Neurologic:  Alert and  oriented x4  Intake/Output from previous day: 12/12 0701 - 12/13 0700 In: 480 [P.O.:480] Out: 100 [Urine:100] Intake/Output this shift: No intake/output data recorded.  Lab Results: Recent Labs    11/10/20 0447  WBC 4.0  HGB 8.1*  HCT 25.6*  PLT 161   BMET Recent Labs    11/09/20 1525 11/12/20 0606  NA 129* 130*  K 3.6 4.0  CL 97* 98  CO2 21* 20*  GLUCOSE 305* 105*  BUN 41* 43*  CREATININE 2.93* 3.09*  CALCIUM 7.1* 7.6*   LFT Recent Labs    11/09/20 1525 11/12/20 0606  ALBUMIN 2.1* 2.3*   Hepatitis Panel Recent Labs    11/09/20 1525  HEPBSAG NON REACTIVE    Assessment: 69 year old male well known to [place] from recent several week admission for diarrhea and rectal bleeding, found to have CT findings (without contrast) of colitis,developing rectal bleeding during hospitalization in setting of Eliquis, receivingmultiple units ofPRBCs,undergoing colonoscopyduringpast recentadmission with poor prep and incomplete exam. Blood noted in rectum, sigmoid, descending colon, and at splenic flexure. Colonoscopy findings suspicious for segmental or ischemic colitis. Pathology without changes or features of IBD, query drug-induced or  ischemic. CMV stains negative.He was started empirically on steroids with mild improvement in diarrhea. Prior hospitalization received empiric course of Cipro and Flagyl. Prior hospitalization negative Cdiff and GI pathogen panel.Patient presented back to the hospital 12/3 due to an episode of syncope while getting up to go to dialysis from SNF.    Extensive evaluation thus far with negative celiac serologies, negative Cdiff, GI pathogen, O&P. Stool sodium and potassium remain pending. Current regimen now includes Colestid, Creon, Imodium TID. Stool remains watery but less frequent. Output decreasing.  Prednisone taper continues, with 10 mg to start tomorrow. Overall, limited additional options for supportive measures and may ultimately need to keep rectal tube when discharged to Rehab. Will need full colonoscopy as outpatient.    Plan: Continue Colestid, weight-based Creon, and Imodium Continue prednisone taper as outlined Supportive measures Outpatient colonoscopy   Annitta Needs, PhD, ANP-BC [facility]    LOS: 10 days    11/12/2020, 10:57 AM

## 2020-11-12 NOTE — Progress Notes (Signed)
PROGRESS NOTE  KALA GASSMANN  OXB:[id number] DOB: 23-Jul-1951 DOA: 11/02/2020 PCP: Patient, No Pcp Per  Brief Narrative:  Per HPI: [name] a 69 y.o.malewith medical history significant fortype 2 diabetes, CKD stage V recently initiated on hemodialysis MWF, atrial fibrillation/flutter on amiodarone and metoprolol and Eliquis, recurrent orthostatic hypotension, dyslipidemia, and recent discharge on 12/2 for colitis with associated adynamic ileus and acute blood loss anemia who presented back to the ED today after he was noted to have an episode of syncope while getting up to go to hemodialysis from his SNF. On arrival to the ED his systolic blood pressures were in the 70 mmHg range. He is noted to have some diarrhea, but he has recently completed course of ciprofloxacin and Flagyl by 11/23. He was noted to have nonspecific colitis on pathology after recent colonoscopy on 11/17 and was started on prednisone taper which he continues to take at this time.  -Patient continues to remain orthostatic and therefore continues to blame blood.  This is complicated by has ongoing colitis on high liquid stool output.  GI reconsulted on 12/6 to continue further evaluation.  Creon and Imodium started 3 times daily on 12/6 however patient continues to have copious stool output, have been unable to remove rectal tube.  12/10: stool seems to be slowing down some.   Assessment & Plan:   Active Problems:   Syncope due to orthostatic hypotension   ESRD on hemodialysis (HCC)  Syncope secondary to recurrent orthostatic hypotension -Continue midodrine as previous - added holding parameters -continue Florinef -Lower extremity stockingswith abdominal binder -Plan for hemodialysis per nephrology12/6, 12/8, 12/10.  -May need palliative care evaluation eventually if he fails to continue improvement  Lactic acidosis - RESOLVED -Likely due to soft blood pressure readings with no signs of other acute infection  noted  Ongoing colitis with diarrhea - stool seems to be slowing down some -Continue prednisone taper by 10 mg/week per GI  -Recent treatment with ciprofloxacin and Flagyl for 10 days completed -Repeat Cdiff negative -GI consultation appreciated  ESRD on HD MWF -Appreciate nephrology evaluation for hemodialysis while inpatient, had hemodialysis 12/6, 12/8, 12/10 with minimal to no fluid removal  Acute urinary retention on 12/13 Bladder scan with >999 mL seen -I/O cath prn if not voiding - May need cath  - Urology follow up needed  Chronic anemia-stable -Received 5 units of PRBCs during his last admission -Continue to hold apixaban per prior admission recommendations -No overt bleeding noted -Hemoglobin remains stable -CBC being monitored  Paroxysmal atrial fibrillation/flutter -Continue close monitoring on telemetry -Resumed home amiodarone and metoprolol -Hold apixaban per cardiology recommendations on prior admission  Dyslipidemia -Continue Zocor  Hypoalbuminemia -Likely mild to moderate protein calorie malnutrition -Dietitian consultation appreciated  DVT prophylaxis:SCDs Code Status:Full Family Communication:Pt prefers to call himself; none at bedside Disposition Plan: SNF when medically stabilized and GI sign off Status is: Inpatient  Remains inpatient appropriate because:IV treatments appropriate due to intensity of illness or inability to take PO and Inpatient level of care appropriate due to severity of illness  Dispo: The patient is from:SNF Anticipated d/c is to:SNF Anticipated d/c date is:1-2days Patient currently is not medically stable to d/c.  Will plan to discharge once stool output has improved and patient less orthostatic.  GI work-up ongoing.  Ongoing hemodialysis treatments. Pt developed acute urinary retention that will require some monitoring as he still does make urine.    Consultants:  Nephrology  GI  Procedures:  See below  Antimicrobials:  None  Subjective: Pt reports having pain in his lower abdomen.    Objective: Vitals:   11/11/20 0500 11/11/20 0513 11/11/20 2156 11/12/20 0957  BP:  (!) 158/91 (!) 166/97 97/61  Pulse:  74 77 89  Resp:  18 20 18   Temp:  98.5 F (36.9 C) 97.6 F (36.4 C) 98.2 F (36.8 C)  TempSrc:  Oral Oral Oral  SpO2:  99% 99% 99%  Weight: 72.2 kg     Height:        Intake/Output Summary (Last 24 hours) at 11/12/2020 1113 Last data filed at 11/12/2020 0100 Gross per 24 hour  Intake 480 ml  Output 100 ml  Net 380 ml   Filed Weights   11/09/20 1125 11/09/20 1510 11/11/20 0500  Weight: 72.1 kg 72.2 kg 72.2 kg    Examination:  General exam: awake, alert, NAD, Appears calm and comfortable  Respiratory system: Clear to auscultation. Respiratory effort normal. Cardiovascular system: normal S1 & S2 heard.  Gastrointestinal system: Abdomen is nondistended, soft and nontender.  Central nervous system: Alert and awake GU: suprapubic tenderness and bladder distension.   Extremities: No edema Skin: No rashes, lesions or ulcers Psychiatry: Flat affect unchanged fecal bag with liquid stool output, amount seems to be slowing  Data Reviewed: I have personally reviewed following labs and imaging studies  CBC: Recent Labs  Lab 11/07/20 1047 11/08/20 0510 11/09/20 0516 11/10/20 0447  WBC 8.8 6.6 5.4 4.0  HGB 8.0* 7.5* 8.9* 8.1*  HCT 25.7* 23.9* 28.7* 25.6*  MCV 93.5 92.6 93.5 92.4  PLT 152 147* 157 163   Basic Metabolic Panel: Recent Labs  Lab 11/07/20 0500 11/08/20 0510 11/09/20 1525 11/12/20 0606  NA 130* 131* 129* 130*  K 3.3* 3.4* 3.6 4.0  CL 98 99 97* 98  CO2 24 24 21* 20*  GLUCOSE 222* 185* 305* 105*  BUN 42* 30* 41* 43*  CREATININE 3.09* 2.57* 2.93* 3.09*  CALCIUM 7.3* 7.1* 7.1* 7.6*  MG 1.6*  --   --   --   PHOS  --  2.2* 2.8 3.7   GFR: Estimated Creatinine Clearance: 22.7  mL/min (A) (by C-G formula based on SCr of 3.09 mg/dL (H)). Liver Function Tests: Recent Labs  Lab 11/08/20 0510 11/09/20 1525 11/12/20 0606  ALBUMIN 2.0* 2.1* 2.3*   No results for input(s): LIPASE, AMYLASE in the last 168 hours. No results for input(s): AMMONIA in the last 168 hours. Coagulation Profile: No results for input(s): INR, PROTIME in the last 168 hours. Cardiac Enzymes: No results for input(s): CKTOTAL, CKMB, CKMBINDEX, TROPONINI in the last 168 hours. BNP (last 3 results) No results for input(s): PROBNP in the last 8760 hours. HbA1C: No results for input(s): HGBA1C in the last 72 hours. CBG: No results for input(s): GLUCAP in the last 168 hours. Lipid Profile: No results for input(s): CHOL, HDL, LDLCALC, TRIG, CHOLHDL, LDLDIRECT in the last 72 hours. Thyroid Function Tests: No results for input(s): TSH, T4TOTAL, FREET4, T3FREE, THYROIDAB in the last 72 hours. Anemia Panel: No results for input(s): VITAMINB12, FOLATE, FERRITIN, TIBC, IRON, RETICCTPCT in the last 72 hours. Sepsis Labs: No results for input(s): PROCALCITON, LATICACIDVEN in the last 168 hours.  Recent Results (from the past 240 hour(s))  MRSA PCR Screening     Status: None   Collection Time: 11/02/20  2:48 PM   Specimen: Nasopharyngeal  Result Value Ref Range Status   MRSA by PCR NEGATIVE NEGATIVE Final    Comment:  The GeneXpert MRSA Assay (FDA approved for NASAL specimens only), is one component of a comprehensive MRSA colonization surveillance program. It is not intended to diagnose MRSA infection nor to guide or monitor treatment for MRSA infections. Performed at [facility], [address] [place], [place] [address]   C Difficile Quick Screen w PCR reflex     Status: None   Collection Time: 11/02/20  3:20 PM   Specimen: STOOL  Result Value Ref Range Status   C Diff antigen NEGATIVE NEGATIVE Final   C Diff toxin NEGATIVE NEGATIVE Final   C Diff interpretation No C. difficile  detected.  Final    Comment: Performed at [facility], [address] [place], [place] [address]  Resp Panel by RT-PCR (Flu A&B, Covid) Nasopharyngeal Swab     Status: None   Collection Time: 11/05/20 10:41 AM   Specimen: Nasopharyngeal Swab; Nasopharyngeal(NP) swabs in vial transport medium  Result Value Ref Range Status   SARS Coronavirus 2 by RT PCR NEGATIVE NEGATIVE Final    Comment: (NOTE) SARS-CoV-2 target nucleic acids are NOT DETECTED.  The SARS-CoV-2 RNA is generally detectable in upper respiratory specimens during the acute phase of infection. The lowest concentration of SARS-CoV-2 viral copies this assay can detect is 138 copies/mL. A negative result does not preclude SARS-Cov-2 infection and should not be used as the sole basis for treatment or other patient management decisions. A negative result may occur with  improper specimen collection/handling, submission of specimen other than nasopharyngeal swab, presence of viral mutation(s) within the areas targeted by this assay, and inadequate number of viral copies(<138 copies/mL). A negative result must be combined with clinical observations, patient history, and epidemiological information. The expected result is Negative.  Fact Sheet for Patients:  EntrepreneurPulse.com.au  Fact Sheet for Healthcare Providers:  IncredibleEmployment.be  This test is no t yet approved or cleared by the [place] FDA and  has been authorized for detection and/or diagnosis of SARS-CoV-2 by FDA under an Emergency Use Authorization (EUA). This EUA will remain  in effect (meaning this test can be used) for the duration of the COVID-19 declaration under Section 564(b)(1) of the Act, 21 U.S.C.section 360bbb-3(b)(1), unless the authorization is terminated  or revoked sooner.       Influenza A by PCR NEGATIVE NEGATIVE Final   Influenza B by PCR NEGATIVE NEGATIVE Final    Comment: (NOTE) The Xpert  Xpress SARS-CoV-2/FLU/RSV plus assay is intended as an aid in the diagnosis of influenza from Nasopharyngeal swab specimens and should not be used as a sole basis for treatment. Nasal washings and aspirates are unacceptable for Xpert Xpress SARS-CoV-2/FLU/RSV testing.  Fact Sheet for Patients: EntrepreneurPulse.com.au  Fact Sheet for Healthcare Providers: IncredibleEmployment.be  This test is not yet approved or cleared by the [place] FDA and has been authorized for detection and/or diagnosis of SARS-CoV-2 by FDA under an Emergency Use Authorization (EUA). This EUA will remain in effect (meaning this test can be used) for the duration of the COVID-19 declaration under Section 564(b)(1) of the Act, 21 U.S.C. section 360bbb-3(b)(1), unless the authorization is terminated or revoked.  Performed at [facility], [address], [place], [place] [address]   OVA + PARASITE EXAM     Status: None   Collection Time: 11/05/20  3:44 PM   Specimen: Per Rectum; Stool  Result Value Ref Range Status   OVA + PARASITE EXAM Final report  Final    Comment: (NOTE) These results were obtained using wet preparation(s)  and trichrome stained smear. This test does not include testing for Cryptosporidium parvum, Cyclospora, or Microsporidia. Performed At: [facility] [place], [place] [id number] Truddie Coco MD XQ:[id number]    Source of Sample STOOL  Final    Comment: Performed at [facility], [address], [place], [place] [address]  Gastrointestinal Panel by PCR , Stool     Status: None   Collection Time: 11/06/20  1:25 PM   Specimen: Stool  Result Value Ref Range Status   Campylobacter species NOT DETECTED NOT DETECTED Final   Plesimonas shigelloides NOT DETECTED NOT DETECTED Final   Salmonella species NOT DETECTED NOT DETECTED Final   Yersinia enterocolitica NOT DETECTED NOT DETECTED Final   Vibrio species NOT DETECTED NOT  DETECTED Final   Vibrio cholerae NOT DETECTED NOT DETECTED Final   Enteroaggregative E coli (EAEC) NOT DETECTED NOT DETECTED Final   Enteropathogenic E coli (EPEC) NOT DETECTED NOT DETECTED Final   Enterotoxigenic E coli (ETEC) NOT DETECTED NOT DETECTED Final   Shiga like toxin producing E coli (STEC) NOT DETECTED NOT DETECTED Final   Shigella/Enteroinvasive E coli (EIEC) NOT DETECTED NOT DETECTED Final   Cryptosporidium NOT DETECTED NOT DETECTED Final   Cyclospora cayetanensis NOT DETECTED NOT DETECTED Final   Entamoeba histolytica NOT DETECTED NOT DETECTED Final   Giardia lamblia NOT DETECTED NOT DETECTED Final   Adenovirus F40/41 NOT DETECTED NOT DETECTED Final   Astrovirus NOT DETECTED NOT DETECTED Final   Norovirus GI/GII NOT DETECTED NOT DETECTED Final   Rotavirus A NOT DETECTED NOT DETECTED Final   Sapovirus (I, II, IV, and V) NOT DETECTED NOT DETECTED Final    Comment: Performed at [facility], [address] [place], [place] [address]  SARS Coronavirus 2 by RT PCR (hospital order, performed in [place] hospital lab) Nasopharyngeal Nasopharyngeal Swab     Status: None   Collection Time: 11/11/20 10:49 AM   Specimen: Nasopharyngeal Swab  Result Value Ref Range Status   SARS Coronavirus 2 NEGATIVE NEGATIVE Final    Comment: Performed at [facility], [address] [place], [place] [address]    Radiology Studies: No results found. Scheduled Meds: . (feeding supplement) PROSource Plus  30 mL Oral BID BM  . amiodarone  200 mg Oral Daily  . calcitRIOL  0.5 mcg Oral Daily  . Chlorhexidine Gluconate Cloth  6 each Topical Q0600  . Chlorhexidine Gluconate Cloth  6 each Topical Q0600  . colestipol  2 g Oral Daily  . darbepoetin (ARANESP) injection - DIALYSIS  100 mcg Intravenous Q Wed-HD  . famotidine  20 mg Oral Daily  . feeding supplement  237 mL Oral BID BM  . fludrocortisone  0.1 mg Oral Daily  . lipase/protease/amylase  36,000 Units Oral With snacks  .  lipase/protease/amylase  72,000 Units Oral TID WC  . loperamide  2 mg Oral TID  . metoprolol tartrate  12.5 mg Oral BID  . midodrine  10 mg Oral TID with meals  . multivitamin  1 tablet Oral QHS  . [START ON 11/13/2020] predniSONE  10 mg Oral Q breakfast  . simvastatin  20 mg Oral QPM  . sodium chloride flush  3 mL Intravenous Q12H   Continuous Infusions: . sodium chloride    . sodium chloride    . sodium chloride      LOS: 10 days   Time spent: 38 minutes  Irwin Brakeman, MD How to contact the [facility] Attending or Consulting provider 7A -  7P or covering provider during after hours [place], for this patient?  1. Check the care team in [facility] and look for a) attending/consulting [facility] provider listed and b) the [facility] team listed 2. Log into [email or web address] and use [place]'s universal password to access. If you do not have the password, please contact the hospital operator. 3. Locate the [facility] provider you are looking for under [facility] and page to a number that you can be directly reached. 4. If you still have difficulty reaching the provider, please page the [facility] (Director on Call) for the Hospitalists listed on amion for assistance.   If 7PM-7AM, please contact night-coverage [email or web address] 11/12/2020, 11:13 AM

## 2020-11-13 ENCOUNTER — Ambulatory Visit (INDEPENDENT_AMBULATORY_CARE_PROVIDER_SITE_OTHER): Payer: Medicare Other | Admitting: Gastroenterology

## 2020-11-13 LAB — POTASSIUM, STOOL: Potassium, Stl: 76 mmol/L

## 2020-11-13 LAB — SODIUM, STOOL: Sodium, Stl: 43 mmol/L

## 2020-11-13 NOTE — Progress Notes (Signed)
    Subjective: Continues to feel well. No abdominal pain, nausea, or vomiting. No lightheadedness the last couple of days. Discussed with nursing staff. Continues to have significantly reduced stool output compared to last week. Currently with brown non-formed stool in bag, 100cc at 12:30 today.   Objective: Vital signs in last 24 hours: Temp:  [97.7 F (36.5 C)-98.6 F (37 C)] 97.9 F (36.6 C) (12/14 1338) Pulse Rate:  [64-77] 64 (12/14 1338) Resp:  [16-20] 20 (12/14 1338) BP: (105-142)/(64-81) 139/72 (12/14 1338) SpO2:  [98 %-99 %] 99 % (12/14 1338) Weight:  [70.2 kg] 70.2 kg (12/13 1930) Last BM Date: 11/12/20 General:   Alert and oriented, pleasant Head:  Normocephalic and atraumatic. Eyes:  No icterus, sclera clear.  Abdomen:  Bowel sounds present, soft, non-tender, non-distended. No HSM or hernias noted. No rebound or guarding. No masses appreciated  Extremities:  Without edema. Neurologic:  Alert and  oriented x4 Psych: Normal mood and affect.  Intake/Output from previous day: 12/13 0701 - 12/14 0700 In: -  Out: 1787 [Urine:1775] Intake/Output this shift: Total I/O In: 3 [I.V.:3] Out: 500 [Urine:500]  Lab Results: No results for input(s): WBC, HGB, HCT, PLT in the last 72 hours. BMET Recent Labs    11/12/20 0606  NA 130*  K 4.0  CL 98  CO2 20*  GLUCOSE 105*  BUN 43*  CREATININE 3.09*  CALCIUM 7.6*   LFT Recent Labs    11/12/20 0606  ALBUMIN 2.3*   Assessment: 69 year old male well known to [place] from recent several week admission for diarrhea and rectal bleeding, found to have CT findings (without contrast) of colitis,developing rectal bleeding during hospitalization in setting of Eliquis, receivingmultiple units ofPRBCs, undergoing colonoscopy during past recent admission with poor prep and incomplete exam. Blood noted in rectum, sigmoid, descending colon, and at splenic flexure. Colonoscopy findings suspicious for segmental or ischemic colitis.  Pathology without changes or features of IBD, query drug-induced or ischemic. CMV stains negative.He was started empirically on steroids with mild improvement in diarrhea. Prior hospitalization received empiric course of Cipro and Flagyl. Prior hospitalization negative Cdiff and GI pathogen panel.Patient presented back to the hospital 12/3 due to an episode of syncope while getting up to go to dialysis from SNF.   Extensive evaluation thus far with negative celiac serologies, negative Cdiff, GI pathogen, O&P.  Stool osmolality 627, stool sodium 43, stool potassium 76. Osmotic gap 52 most consistent with secretory diarrhea. Serum serotonin in process.  5 HIAA ordered yesterday waiting on 24-hour urine collection. CT angio A/P also completed this admission with no evidence of mesenteric ischemia.  Current regimen now includes Colestid, Creon, Imodium TID. Protonix was also discontinued. Stool remains watery but less frequent. Output decreasing. Not sure how much creon is helping. Discussed with Dr. Jenetta Downer. We will discontinue creon today and monitor.   Prednisone taper continues, started 10 mg today. Overall, limited additional options for supportive measures and may ultimately need to keep rectal tube when discharged to Rehab. Will need full colonoscopy as outpatient.   Plan: 1.  Discontinue weight-based Creon for now. 2.  Continue Colestid and Imodium 3.  Continue prednisone taper as outlined 4.  Follow-up on 5HIAA results.  5.  Supportive measures 6.  Outpatient colonoscopy   LOS: 11 days    11/13/2020, 2:03 PM   [name], [facility]

## 2020-11-13 NOTE — Progress Notes (Signed)
Bladder scan complete-55ml total noted.

## 2020-11-13 NOTE — Care Management Important Message (Signed)
Important Message  Patient Details  Name: [name] MRN: [id number] Date of Birth: 25-Jan-1951   Medicare Important Message Given:  Yes     [name] 11/13/2020, 4:06 PM

## 2020-11-13 NOTE — Progress Notes (Signed)
Catheter removed, pt tolerated skill well. Urinal placed bedside. Will monitor output

## 2020-11-13 NOTE — Progress Notes (Signed)
Patient ID: [name], male   DOB: [birth year], 69 y.o.   MRN: [id number] S: Still having diarrhea and reports that he has not been up for orthostatic vital signs. O:BP 127/76 (BP Location: Left Arm)   Pulse 67   Temp 98.5 F (36.9 C) (Oral)   Resp 18   Ht 6\' 2"  (1.88 m)   Wt 70.2 kg   SpO2 98%   BMI 19.87 kg/m   Intake/Output Summary (Last 24 hours) at 11/13/2020 0900 Last data filed at 11/13/2020 0830 Gross per 24 hour  Intake --  Output 1437 ml  Net -1437 ml   Intake/Output: I/O last 3 completed shifts: In: 240 [P.O.:240] Out: 1887 [Urine:1875; Other:12]  Intake/Output this shift:  Total I/O In: -  Out: 500 [Urine:500] Weight change:  Gen: frail, elderly WM in NAD CVS: RRR Resp: cta Abd: +BS, soft, NT/ND, not wearing abdominal binder Ext: trace ankle edema  Recent Labs  Lab 11/07/20 0500 11/08/20 0510 11/09/20 1525 11/12/20 0606  NA 130* 131* 129* 130*  K 3.3* 3.4* 3.6 4.0  CL 98 99 97* 98  CO2 24 24 21* 20*  GLUCOSE 222* 185* 305* 105*  BUN 42* 30* 41* 43*  CREATININE 3.09* 2.57* 2.93* 3.09*  ALBUMIN  --  2.0* 2.1* 2.3*  CALCIUM 7.3* 7.1* 7.1* 7.6*  PHOS  --  2.2* 2.8 3.7   Liver Function Tests: Recent Labs  Lab 11/08/20 0510 11/09/20 1525 11/12/20 0606  ALBUMIN 2.0* 2.1* 2.3*   No results for input(s): LIPASE, AMYLASE in the last 168 hours. No results for input(s): AMMONIA in the last 168 hours. CBC: Recent Labs  Lab 11/07/20 1047 11/08/20 0510 11/09/20 0516 11/10/20 0447  WBC 8.8 6.6 5.4 4.0  HGB 8.0* 7.5* 8.9* 8.1*  HCT 25.7* 23.9* 28.7* 25.6*  MCV 93.5 92.6 93.5 92.4  PLT 152 147* 157 161   Cardiac Enzymes: No results for input(s): CKTOTAL, CKMB, CKMBINDEX, TROPONINI in the last 168 hours. CBG: No results for input(s): GLUCAP in the last 168 hours.  Iron Studies: No results for input(s): IRON, TIBC, TRANSFERRIN, FERRITIN in the last 72 hours. Studies/Results: No results found. . (feeding supplement) PROSource Plus  30 mL Oral  BID BM  . amiodarone  200 mg Oral Daily  . calcitRIOL  0.5 mcg Oral Daily  . Chlorhexidine Gluconate Cloth  6 each Topical Q0600  . Chlorhexidine Gluconate Cloth  6 each Topical Q0600  . colestipol  2 g Oral Daily  . darbepoetin (ARANESP) injection - DIALYSIS  100 mcg Intravenous Q Wed-HD  . famotidine  20 mg Oral Daily  . feeding supplement  237 mL Oral BID BM  . fludrocortisone  0.1 mg Oral Daily  . lipase/protease/amylase  36,000 Units Oral With snacks  . lipase/protease/amylase  72,000 Units Oral TID WC  . loperamide  2 mg Oral TID  . metoprolol tartrate  12.5 mg Oral BID  . midodrine  10 mg Oral TID with meals  . multivitamin  1 tablet Oral QHS  . predniSONE  10 mg Oral Q breakfast  . simvastatin  20 mg Oral QPM  . sodium chloride flush  3 mL Intravenous Q12H    BMET    Component Value Date/Time   NA 130 (L) 11/12/2020 0606   K 4.0 11/12/2020 0606   CL 98 11/12/2020 0606   CO2 20 (L) 11/12/2020 0606   GLUCOSE 105 (H) 11/12/2020 0606   BUN 43 (H) 11/12/2020 0606   CREATININE 3.09 (H)  11/12/2020 0606   CALCIUM 7.6 (L) 11/12/2020 0606   GFRNONAA 21 (L) 11/12/2020 0606   GFRAA 19 (L) 06/15/2018 0407   CBC    Component Value Date/Time   WBC 4.0 11/10/2020 0447   RBC 2.77 (L) 11/10/2020 0447   HGB 8.1 (L) 11/10/2020 0447   HCT 25.6 (L) 11/10/2020 0447   PLT 161 11/10/2020 0447   MCV 92.4 11/10/2020 0447   MCH 29.2 11/10/2020 0447   MCHC 31.6 11/10/2020 0447   RDW 20.1 (H) 11/10/2020 0447   LYMPHSABS 0.5 (L) 11/02/2020 1209   MONOABS 1.2 (H) 11/02/2020 1209   EOSABS 0.0 11/02/2020 1209   BASOSABS 0.1 11/02/2020 1209     Assessment/Plan:  1. RecurrentSyncope/hypotension; sig orthostasis:On TID Midodrine. HD with minimal UFdue to good UOP. 1. Please make sure he is wearing an abdominal binder and compression stockings 2. Need to keep head of bed raised 30-45 degrees 3. He also needs PT assistance with orthostatic vital signs. 4. Would also  considerdroxydopa orpyridostigmine if fludrocortisone does not help (stopping florinef and midodrine). 5. Also need to control diarrhea which is likely contributing as well. 6. Keep even with HD. 7. Please follow orthostatics (none documented) and ambulate with PT as tolerated.  2. Colitis- Ranee Gosselin been following and he completed a course ofcipro/flagyl. He had colonoscopy on 10/17/20 which was a poor prep and incomplete exam but did noteblood in the rectum and sigmoid colon, in the descending colon and at the splenic flexure; colitis involving descending and sigmoid colons seen. Biopsies performed. Per GI and surgery. His diarrhea persists and now has a flexseal. Currently on prednisone taper, creon, colestid, and imodium.  3. Urinary retention- this is new.  Pt with significant UOP.  Improved with foley catheter and may need outpatient Urology evaluation.  Unclear if this is drug related or due to BPH 4. [facility]. HDoff schedule today but will get back on Monday. 5. Anemia:of CKD- s/p blood transfusion. No heparin with HD. GI on board, colonoscopyfindings as above. Rec 1u prbc 11/22 1. Transfuse as needed to keep Hgb >7 2. On Aranesp weekly. 6. 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 Resume calcitriol 0.65mcg daily (was on this as an outpatient). Calcium correctsto 8.7. 7. Severe protein malnutrition:per primary, push protein 8. H/o Hypertension:see #1 9. Vascular access- will need to f/u with Dr. Donnetta Hutching once stable for discharge. 10. A fib- on amiodarone and eliquis (off A/C as of right now) 11. Hyponatremia. Monitor 12. Disposition- pending ability to safely ambulate before discharge.  Donetta Potts, MD [name] [phone]

## 2020-11-13 NOTE — Progress Notes (Signed)
PROGRESS NOTE  [name]  QHU:[id number] DOB: 07/03/1951 DOA: 11/02/2020 PCP: Patient, No Pcp Per  Brief Narrative:  Per HPI: [name] a 69 y.o.malewith medical history significant fortype 2 diabetes, CKD stage V recently initiated on hemodialysis MWF, atrial fibrillation/flutter on amiodarone and metoprolol and Eliquis, recurrent orthostatic hypotension, dyslipidemia, and recent discharge on 12/2 for colitis with associated adynamic ileus and acute blood loss anemia who presented back to the ED today after he was noted to have an episode of syncope while getting up to go to hemodialysis from his SNF. On arrival to the ED his systolic blood pressures were in the 70 mmHg range. He is noted to have some diarrhea, but he has recently completed course of ciprofloxacin and Flagyl by 11/23. He was noted to have nonspecific colitis on pathology after recent colonoscopy on 11/17 and was started on prednisone taper which he continues to take at this time.  -Patient continues to remain orthostatic and therefore continues to blame blood.  This is complicated by has ongoing colitis on high liquid stool output.  GI reconsulted on 12/6 to continue further evaluation.  Creon and Imodium started 3 times daily on 12/6 however patient continues to have copious stool output, have been unable to remove rectal tube.  12/10: stool seems to be slowing down some.   Assessment & Plan:   Active Problems:   Syncope due to orthostatic hypotension   ESRD on hemodialysis (HCC)  Syncope secondary to recurrent orthostatic hypotension -Continue midodrine as previous - added holding parameters -continue Florinef -Lower extremity stockingswith abdominal binder -Plan for hemodialysis per nephrology -May need palliative care evaluation eventually if he fails to continue improvement  Lactic acidosis - RESOLVED -Likely due to earlier soft blood pressure readings with no signs of other acute infection  noted  Ongoing colitis with diarrhea - stool seems to be slowing down some -Continue prednisone taper by 10 mg/week per GI  -Recent treatment with ciprofloxacin and Flagyl for 10 days completed -Repeat Cdiff negative -GI consultation appreciated  ESRD on HD MWF -Appreciate nephrology evaluation for hemodialysis while inpatient, having hemodialysis with minimal fluid removal  Acute urinary retention on 12/13 Bladder scan with >999 mL seen -I/O cath prn if not voiding - May need cath  - Urology follow up needed  Chronic anemia-stable -Received 5 units of PRBCs during his last admission -Continue to hold apixaban per prior admission recommendations -No overt bleeding noted -Hemoglobin remains stable -CBC being monitored  Paroxysmal atrial fibrillation/flutter -Continue close monitoring on telemetry -Resumed home amiodarone and metoprolol -Hold apixaban per cardiology recommendations on prior admission  Dyslipidemia -Continue Zocor  Hypoalbuminemia -Likely mild to moderate protein calorie malnutrition -Dietitian consultation appreciated  DVT prophylaxis:SCDs Code Status:Full Family Communication:Pt prefers to call himself; none at bedside Disposition Plan: SNF when medically stabilized and GI sign off Status is: Inpatient  Remains inpatient appropriate because:IV treatments appropriate due to intensity of illness or inability to take PO and Inpatient level of care appropriate due to severity of illness  Dispo: The patient is from:SNF Anticipated d/c is to:SNF Anticipated d/c date is:1-2days Patient currently is not medically stable to d/c.  Will plan to discharge once stool output has improved and patient less orthostatic.  GI work-up ongoing.  Ongoing hemodialysis treatments. Pt developed acute urinary retention that will require some monitoring as he still does make urine.    Consultants:  Nephrology  GI  Procedures:  See below  Antimicrobials:   None  Subjective: Pt is  reporting that he had some gas pain this morning.     Objective: Vitals:   11/13/20 0526 11/13/20 0928 11/13/20 1228 11/13/20 1338  BP: 127/76  (!) 142/76 139/72  Pulse: 67 72 64 64  Resp: 18  18 20   Temp: 98.5 F (36.9 C)  97.7 F (36.5 C) 97.9 F (36.6 C)  TempSrc: Oral  Oral Oral  SpO2: 98%  99% 99%  Weight:      Height:        Intake/Output Summary (Last 24 hours) at 11/13/2020 1538 Last data filed at 11/13/2020 1410 Gross per 24 hour  Intake 3 ml  Output 1937 ml  Net -1934 ml   Filed Weights   11/11/20 0500 11/12/20 1615 11/12/20 1930  Weight: 72.2 kg 70.2 kg 70.2 kg    Examination:  General exam: awake, alert, NAD, Appears calm and comfortable  Respiratory system: Clear to auscultation. Respiratory effort normal. Cardiovascular system: normal S1 & S2 heard.  Gastrointestinal system: Abdomen is nondistended, soft and nontender.  Central nervous system: Alert and awake GU: suprapubic tenderness and bladder distension.   Extremities: No edema Skin: No rashes, lesions or ulcers Psychiatry: Flat affect unchanged fecal bag with liquid stool output, amount seems to be slowing  Data Reviewed: I have personally reviewed following labs and imaging studies  CBC: Recent Labs  Lab 11/07/20 1047 11/08/20 0510 11/09/20 0516 11/10/20 0447  WBC 8.8 6.6 5.4 4.0  HGB 8.0* 7.5* 8.9* 8.1*  HCT 25.7* 23.9* 28.7* 25.6*  MCV 93.5 92.6 93.5 92.4  PLT 152 147* 157 956   Basic Metabolic Panel: Recent Labs  Lab 11/07/20 0500 11/08/20 0510 11/09/20 1525 11/12/20 0606  NA 130* 131* 129* 130*  K 3.3* 3.4* 3.6 4.0  CL 98 99 97* 98  CO2 24 24 21* 20*  GLUCOSE 222* 185* 305* 105*  BUN 42* 30* 41* 43*  CREATININE 3.09* 2.57* 2.93* 3.09*  CALCIUM 7.3* 7.1* 7.1* 7.6*  MG 1.6*  --   --   --   PHOS  --  2.2* 2.8 3.7   GFR: Estimated Creatinine Clearance:  22.1 mL/min (A) (by C-G formula based on SCr of 3.09 mg/dL (H)). Liver Function Tests: Recent Labs  Lab 11/08/20 0510 11/09/20 1525 11/12/20 0606  ALBUMIN 2.0* 2.1* 2.3*   No results for input(s): LIPASE, AMYLASE in the last 168 hours. No results for input(s): AMMONIA in the last 168 hours. Coagulation Profile: No results for input(s): INR, PROTIME in the last 168 hours. Cardiac Enzymes: No results for input(s): CKTOTAL, CKMB, CKMBINDEX, TROPONINI in the last 168 hours. BNP (last 3 results) No results for input(s): PROBNP in the last 8760 hours. HbA1C: No results for input(s): HGBA1C in the last 72 hours. CBG: No results for input(s): GLUCAP in the last 168 hours. Lipid Profile: No results for input(s): CHOL, HDL, LDLCALC, TRIG, CHOLHDL, LDLDIRECT in the last 72 hours. Thyroid Function Tests: No results for input(s): TSH, T4TOTAL, FREET4, T3FREE, THYROIDAB in the last 72 hours. Anemia Panel: No results for input(s): VITAMINB12, FOLATE, FERRITIN, TIBC, IRON, RETICCTPCT in the last 72 hours. Sepsis Labs: No results for input(s): PROCALCITON, LATICACIDVEN in the last 168 hours.  Recent Results (from the past 240 hour(s))  Resp Panel by RT-PCR (Flu A&B, Covid) Nasopharyngeal Swab     Status: None   Collection Time: 11/05/20 10:41 AM   Specimen: Nasopharyngeal Swab; Nasopharyngeal(NP) swabs in vial transport medium  Result Value Ref Range Status   SARS Coronavirus 2 by RT PCR  NEGATIVE NEGATIVE Final    Comment: (NOTE) SARS-CoV-2 target nucleic acids are NOT DETECTED.  The SARS-CoV-2 RNA is generally detectable in upper respiratory specimens during the acute phase of infection. The lowest concentration of SARS-CoV-2 viral copies this assay can detect is 138 copies/mL. A negative result does not preclude SARS-Cov-2 infection and should not be used as the sole basis for treatment or other patient management decisions. A negative result may occur with  improper specimen  collection/handling, submission of specimen other than nasopharyngeal swab, presence of viral mutation(s) within the areas targeted by this assay, and inadequate number of viral copies(<138 copies/mL). A negative result must be combined with clinical observations, patient history, and epidemiological information. The expected result is Negative.  Fact Sheet for Patients:  EntrepreneurPulse.com.au  Fact Sheet for Healthcare Providers:  IncredibleEmployment.be  This test is no t yet approved or cleared by the [place] FDA and  has been authorized for detection and/or diagnosis of SARS-CoV-2 by FDA under an Emergency Use Authorization (EUA). This EUA will remain  in effect (meaning this test can be used) for the duration of the COVID-19 declaration under Section 564(b)(1) of the Act, 21 U.S.C.section 360bbb-3(b)(1), unless the authorization is terminated  or revoked sooner.       Influenza A by PCR NEGATIVE NEGATIVE Final   Influenza B by PCR NEGATIVE NEGATIVE Final    Comment: (NOTE) The Xpert Xpress SARS-CoV-2/FLU/RSV plus assay is intended as an aid in the diagnosis of influenza from Nasopharyngeal swab specimens and should not be used as a sole basis for treatment. Nasal washings and aspirates are unacceptable for Xpert Xpress SARS-CoV-2/FLU/RSV testing.  Fact Sheet for Patients: EntrepreneurPulse.com.au  Fact Sheet for Healthcare Providers: IncredibleEmployment.be  This test is not yet approved or cleared by the [place] FDA and has been authorized for detection and/or diagnosis of SARS-CoV-2 by FDA under an Emergency Use Authorization (EUA). This EUA will remain in effect (meaning this test can be used) for the duration of the COVID-19 declaration under Section 564(b)(1) of the Act, 21 U.S.C. section 360bbb-3(b)(1), unless the authorization is terminated or revoked.  Performed at [facility], [address] [place], [place] [address]   OVA + PARASITE EXAM     Status: None   Collection Time: 11/05/20  3:44 PM   Specimen: Per Rectum; Stool  Result Value Ref Range Status   OVA + PARASITE EXAM Final report  Final    Comment: (NOTE) These results were obtained using wet preparation(s) and trichrome stained smear. This test does not include testing for Cryptosporidium parvum, Cyclospora, or Microsporidia. Performed At: [facility] [address] [place], [place] [id number] Truddie Coco MD AU:[id number]    Source of Sample STOOL  Final    Comment: Performed at [facility], [address], [place], [place] [address]  Gastrointestinal Panel by PCR , Stool     Status: None   Collection Time: 11/06/20  1:25 PM   Specimen: Stool  Result Value Ref Range Status   Campylobacter species NOT DETECTED NOT DETECTED Final   Plesimonas shigelloides NOT DETECTED NOT DETECTED Final   Salmonella species NOT DETECTED NOT DETECTED Final   Yersinia enterocolitica NOT DETECTED NOT DETECTED Final   Vibrio species NOT DETECTED NOT DETECTED Final   Vibrio cholerae NOT DETECTED NOT DETECTED Final   Enteroaggregative E coli (EAEC) NOT DETECTED NOT DETECTED Final   Enteropathogenic E coli (EPEC) NOT DETECTED NOT DETECTED Final   Enterotoxigenic E coli (ETEC) NOT DETECTED  NOT DETECTED Final   Shiga like toxin producing E coli (STEC) NOT DETECTED NOT DETECTED Final   Shigella/Enteroinvasive E coli (EIEC) NOT DETECTED NOT DETECTED Final   Cryptosporidium NOT DETECTED NOT DETECTED Final   Cyclospora cayetanensis NOT DETECTED NOT DETECTED Final   Entamoeba histolytica NOT DETECTED NOT DETECTED Final   Giardia lamblia NOT DETECTED NOT DETECTED Final   Adenovirus F40/41 NOT DETECTED NOT DETECTED Final   Astrovirus NOT DETECTED NOT DETECTED Final   Norovirus GI/GII NOT DETECTED NOT DETECTED Final   Rotavirus A NOT DETECTED NOT DETECTED Final   Sapovirus (I, II, IV, and V) NOT DETECTED  NOT DETECTED Final    Comment: Performed at [facility], [address] [place], [place] [address]  SARS Coronavirus 2 by RT PCR (hospital order, performed in [place] hospital lab) Nasopharyngeal Nasopharyngeal Swab     Status: None   Collection Time: 11/11/20 10:49 AM   Specimen: Nasopharyngeal Swab  Result Value Ref Range Status   SARS Coronavirus 2 NEGATIVE NEGATIVE Final    Comment: Performed at [facility], [address] [place], [place] [address]    Radiology Studies: No results found. Scheduled Meds: . (feeding supplement) PROSource Plus  30 mL Oral BID BM  . amiodarone  200 mg Oral Daily  . calcitRIOL  0.5 mcg Oral Daily  . Chlorhexidine Gluconate Cloth  6 each Topical Q0600  . Chlorhexidine Gluconate Cloth  6 each Topical Q0600  . colestipol  2 g Oral Daily  . darbepoetin (ARANESP) injection - DIALYSIS  100 mcg Intravenous Q Wed-HD  . famotidine  20 mg Oral Daily  . feeding supplement  237 mL Oral BID BM  . fludrocortisone  0.1 mg Oral Daily  . loperamide  2 mg Oral TID  . metoprolol tartrate  12.5 mg Oral BID  . midodrine  10 mg Oral TID with meals  . multivitamin  1 tablet Oral QHS  . predniSONE  10 mg Oral Q breakfast  . simvastatin  20 mg Oral QPM  . sodium chloride flush  3 mL Intravenous Q12H   Continuous Infusions: . sodium chloride    . sodium chloride    . sodium chloride      LOS: 11 days   Time spent: 35 minutes  Breyonna Nault Wynetta Emery, MD How to contact the [facility] Attending or Consulting provider [place] or covering provider during after hours [place], for this patient?  1. Check the care team in [facility] and look for a) attending/consulting [facility] provider listed and b) the [facility] team listed 2. Log into [email or web address] and use [place] universal password to access. If you do not have the password, please contact the hospital operator. 3. Locate the [facility] provider you are looking for under [facility] and page to a number that you can be directly  reached. 4. If you still have difficulty reaching the provider, please page the [facility] (Director on Call) for the Hospitalists listed on amion for assistance.   If 7PM-7AM, please contact night-coverage [email or web address] 11/13/2020, 3:38 PM

## 2020-11-14 LAB — RENAL FUNCTION PANEL
Albumin: 1.9 g/dL — ABNORMAL LOW (ref 3.5–5.0)
Anion gap: 9 (ref 5–15)
BUN: 40 mg/dL — ABNORMAL HIGH (ref 8–23)
CO2: 24 mmol/L (ref 22–32)
Calcium: 7.2 mg/dL — ABNORMAL LOW (ref 8.9–10.3)
Chloride: 98 mmol/L (ref 98–111)
Creatinine, Ser: 3.12 mg/dL — ABNORMAL HIGH (ref 0.61–1.24)
GFR, Estimated: 21 mL/min — ABNORMAL LOW (ref >60)
Glucose, Bld: 137 mg/dL — ABNORMAL HIGH (ref 70–99)
Phosphorus: 2.9 mg/dL (ref 2.5–4.6)
Potassium: 3.3 mmol/L — ABNORMAL LOW (ref 3.5–5.1)
Sodium: 131 mmol/L — ABNORMAL LOW (ref 135–145)

## 2020-11-14 LAB — CBC
HCT: 24.5 % — ABNORMAL LOW (ref 39.0–52.0)
Hemoglobin: 7.9 g/dL — ABNORMAL LOW (ref 13.0–17.0)
MCH: 30 pg (ref 26.0–34.0)
MCHC: 32.2 g/dL (ref 30.0–36.0)
MCV: 93.2 fL (ref 80.0–100.0)
Platelets: 183 10*3/uL (ref 150–400)
RBC: 2.63 MIL/uL — ABNORMAL LOW (ref 4.22–5.81)
RDW: 20.5 % — ABNORMAL HIGH (ref 11.5–15.5)
WBC: 5.8 10*3/uL (ref 4.0–10.5)
nRBC: 0 % (ref 0.0–0.2)

## 2020-11-14 LAB — SEROTONIN SERUM: Serotonin, Serum: <5 ng/mL — ABNORMAL LOW (ref 21–321)

## 2020-11-14 MED ORDER — COLESTIPOL HCL 1 G PO TABS
4.0000 g | ORAL_TABLET | Freq: Every day | ORAL | Status: DC
  Administered 2020-11-15 – 2020-11-19 (×4): 4 g via ORAL
  Filled 2020-11-14 (×7): qty 4

## 2020-11-14 MED ORDER — COLESTIPOL HCL 1 G PO TABS
2.0000 g | ORAL_TABLET | Freq: Once | ORAL | Status: AC
  Administered 2020-11-14: 2 g via ORAL
  Filled 2020-11-14: qty 2

## 2020-11-14 MED ORDER — POTASSIUM CHLORIDE CRYS ER 20 MEQ PO TBCR
40.0000 meq | EXTENDED_RELEASE_TABLET | Freq: Once | ORAL | Status: AC
  Administered 2020-11-14: 40 meq via ORAL
  Filled 2020-11-14: qty 2

## 2020-11-14 NOTE — Progress Notes (Signed)
PROGRESS NOTE    [name]  BJS:[id number] DOB: 1951-02-19 DOA: 11/02/2020 PCP: Patient, No Pcp Per   Brief Narrative:  Per HPI: [name] a 69 y.o.malewith medical history significant fortype 2 diabetes, CKD stage V recently initiated on hemodialysis MWF, atrial fibrillation/flutter on amiodarone and metoprolol and Eliquis, recurrent orthostatic hypotension, dyslipidemia, and recent discharge on 12/2 for colitis with associated adynamic ileus and acute blood loss anemia who presented back to the ED today after he was noted to have an episode of syncope while getting up to go to hemodialysis from his SNF. On arrival to the ED his systolic blood pressures were in the 70 mmHg range. He is noted to have some diarrhea, but he has recently completed course of ciprofloxacin and Flagyl by 11/23. He was noted to have nonspecific colitis on pathology after recent colonoscopy on 11/17 and was started on prednisone taper which he continues to take at this time.  -Patient continues to remain orthostatic and therefore continues to blame blood.  This is complicated by has ongoing colitis on high liquid stool output.  GI reconsulted on 12/6 to continue further evaluation.  Creon and Imodium started 3 times daily on 12/6 however patient continues to have copious stool output, have been unable to remove rectal tube.  12/10: stool seems to be slowing down some.   Assessment & Plan:   Active Problems:   Syncope due to orthostatic hypotension   ESRD on hemodialysis (HCC)   Syncope secondary to recurrent orthostatic hypotension -Continue midodrine as previous - added holding parameters -continue Florinef -Lower extremity stockingswith abdominal binder -Plan for hemodialysis per nephrologynext session 12/16 -May need palliative care evaluation eventually if he fails to continue improvement  Lactic acidosis - RESOLVED -Likely due to earlier soft blood pressure readings with no signs of other  acute infection noted  Ongoing colitis with diarrhea - stool seems to be slowing down some -Continue prednisone taper by 10 mg/week per GI  -Recent treatment with ciprofloxacin and Flagyl for 10 days completed -Continue on Imodium 3 times daily as well as Colestid 4 g daily as adjusted per GI -5-HIAA results pending -Repeat Cdiff negative -GI ongoing consultation appreciated  ESRD on HD MWF -Appreciate nephrology evaluation for hemodialysis while inpatient, having hemodialysis with minimal fluid removal  Acute urinary retention on 12/13 Bladder scan with >999 mL seen -I/O cath prn if not voiding - May need cath  - Urology follow up needed -Question if this is drug related or due to BPH  Chronic anemia-stable -Received 5 units of PRBCs during his last admission -Continue to hold apixaban per prior admission recommendations -No overt bleeding noted -Hemoglobin remains stable -CBC being monitored  Paroxysmal atrial fibrillation/flutter -Continue close monitoring on telemetry -Resumed home amiodarone and metoprolol -Hold apixaban per cardiology recommendations on prior admission  Dyslipidemia -Continue Zocor  Hypoalbuminemia -Likely mild to moderate protein calorie malnutrition -Dietitian consultation appreciated  DVT prophylaxis:SCDs Code Status:Full Family Communication:Pt prefers to call himself; none at bedside Disposition Plan: SNF when medically stabilized and GI sign off Status is: Inpatient  Remains inpatient appropriate because:IV treatments appropriate due to intensity of illness or inability to take PO and Inpatient level of care appropriate due to severity of illness  Dispo: The patient is from:SNF Anticipated d/c is to:SNF Anticipated d/c date is:1-2days Patient currently is not medically stable to d/c.Will plan to discharge once stool output has improved and patient less orthostatic.  GI work-up  ongoing.  Ongoing hemodialysis treatments. Pt developed  acute urinary retention that will require some monitoring as he still does make urine.   Consultants:   Nephrology  GI  Procedures:   See below  Antimicrobials:   None   Subjective: Patient seen and evaluated today with increasing amounts of gas.  He is noted to have ongoing liquid stool in rectal tube.  He denies any abdominal pain.  Objective: Vitals:   11/13/20 1338 11/13/20 2102 11/14/20 0345 11/14/20 0439  BP: 139/72 (!) 148/77 (!) 147/74   Pulse: 64 67 77   Resp: 20 18 18    Temp: 97.9 F (36.6 C) 97.9 F (36.6 C) 98.4 F (36.9 C)   TempSrc: Oral     SpO2: 99% 99% 100%   Weight:    72.4 kg  Height:        Intake/Output Summary (Last 24 hours) at 11/14/2020 1514 Last data filed at 11/14/2020 0300 Gross per 24 hour  Intake 120 ml  Output 900 ml  Net -780 ml   Filed Weights   11/12/20 1615 11/12/20 1930 11/14/20 0439  Weight: 70.2 kg 70.2 kg 72.4 kg    Examination:  General exam: Appears calm and comfortable  Respiratory system: Clear to auscultation. Respiratory effort normal. Cardiovascular system: S1 & S2 heard, RRR.  Gastrointestinal system: Abdomen is nondistended, soft and nontender.  Flexi-Seal with ongoing liquid stool output. Central nervous system: Alert and oriented. No focal neurological deficits. Extremities: Symmetric 5 x 5 power. Skin: No rashes, lesions or ulcers Psychiatry: Judgement and insight appear normal. Mood & affect appropriate.     Data Reviewed: I have personally reviewed following labs and imaging studies  CBC: Recent Labs  Lab 11/08/20 0510 11/09/20 0516 11/10/20 0447 11/14/20 0544  WBC 6.6 5.4 4.0 5.8  HGB 7.5* 8.9* 8.1* 7.9*  HCT 23.9* 28.7* 25.6* 24.5*  MCV 92.6 93.5 92.4 93.2  PLT 147* 157 161 119   Basic Metabolic Panel: Recent Labs  Lab 11/08/20 0510 11/09/20 1525 11/12/20 0606 11/14/20 0544  NA 131* 129* 130* 131*  K 3.4* 3.6 4.0 3.3*  CL  99 97* 98 98  CO2 24 21* 20* 24  GLUCOSE 185* 305* 105* 137*  BUN 30* 41* 43* 40*  CREATININE 2.57* 2.93* 3.09* 3.12*  CALCIUM 7.1* 7.1* 7.6* 7.2*  PHOS 2.2* 2.8 3.7 2.9   GFR: Estimated Creatinine Clearance: 22.6 mL/min (A) (by C-G formula based on SCr of 3.12 mg/dL (H)). Liver Function Tests: Recent Labs  Lab 11/08/20 0510 11/09/20 1525 11/12/20 0606 11/14/20 0544  ALBUMIN 2.0* 2.1* 2.3* 1.9*   No results for input(s): LIPASE, AMYLASE in the last 168 hours. No results for input(s): AMMONIA in the last 168 hours. Coagulation Profile: No results for input(s): INR, PROTIME in the last 168 hours. Cardiac Enzymes: No results for input(s): CKTOTAL, CKMB, CKMBINDEX, TROPONINI in the last 168 hours. BNP (last 3 results) No results for input(s): PROBNP in the last 8760 hours. HbA1C: No results for input(s): HGBA1C in the last 72 hours. CBG: No results for input(s): GLUCAP in the last 168 hours. Lipid Profile: No results for input(s): CHOL, HDL, LDLCALC, TRIG, CHOLHDL, LDLDIRECT in the last 72 hours. Thyroid Function Tests: No results for input(s): TSH, T4TOTAL, FREET4, T3FREE, THYROIDAB in the last 72 hours. Anemia Panel: No results for input(s): VITAMINB12, FOLATE, FERRITIN, TIBC, IRON, RETICCTPCT in the last 72 hours. Sepsis Labs: No results for input(s): PROCALCITON, LATICACIDVEN in the last 168 hours.  Recent Results (from the past 240 hour(s))  Resp Panel by RT-PCR (  Flu A&B, Covid) Nasopharyngeal Swab     Status: None   Collection Time: 11/05/20 10:41 AM   Specimen: Nasopharyngeal Swab; Nasopharyngeal(NP) swabs in vial transport medium  Result Value Ref Range Status   SARS Coronavirus 2 by RT PCR NEGATIVE NEGATIVE Final    Comment: (NOTE) SARS-CoV-2 target nucleic acids are NOT DETECTED.  The SARS-CoV-2 RNA is generally detectable in upper respiratory specimens during the acute phase of infection. The lowest concentration of SARS-CoV-2 viral copies this assay can  detect is 138 copies/mL. A negative result does not preclude SARS-Cov-2 infection and should not be used as the sole basis for treatment or other patient management decisions. A negative result may occur with  improper specimen collection/handling, submission of specimen other than nasopharyngeal swab, presence of viral mutation(s) within the areas targeted by this assay, and inadequate number of viral copies(<138 copies/mL). A negative result must be combined with clinical observations, patient history, and epidemiological information. The expected result is Negative.  Fact Sheet for Patients:  EntrepreneurPulse.com.au  Fact Sheet for Healthcare Providers:  IncredibleEmployment.be  This test is no t yet approved or cleared by the [place] FDA and  has been authorized for detection and/or diagnosis of SARS-CoV-2 by FDA under an Emergency Use Authorization (EUA). This EUA will remain  in effect (meaning this test can be used) for the duration of the COVID-19 declaration under Section 564(b)(1) of the Act, 21 U.S.C.section 360bbb-3(b)(1), unless the authorization is terminated  or revoked sooner.       Influenza A by PCR NEGATIVE NEGATIVE Final   Influenza B by PCR NEGATIVE NEGATIVE Final    Comment: (NOTE) The Xpert Xpress SARS-CoV-2/FLU/RSV plus assay is intended as an aid in the diagnosis of influenza from Nasopharyngeal swab specimens and should not be used as a sole basis for treatment. Nasal washings and aspirates are unacceptable for Xpert Xpress SARS-CoV-2/FLU/RSV testing.  Fact Sheet for Patients: EntrepreneurPulse.com.au  Fact Sheet for Healthcare Providers: IncredibleEmployment.be  This test is not yet approved or cleared by the [place] FDA and has been authorized for detection and/or diagnosis of SARS-CoV-2 by FDA under an Emergency Use Authorization (EUA). This EUA will remain in  effect (meaning this test can be used) for the duration of the COVID-19 declaration under Section 564(b)(1) of the Act, 21 U.S.C. section 360bbb-3(b)(1), unless the authorization is terminated or revoked.  Performed at [facility], [address], [place], [place] [address]   OVA + PARASITE EXAM     Status: None   Collection Time: 11/05/20  3:44 PM   Specimen: Per Rectum; Stool  Result Value Ref Range Status   OVA + PARASITE EXAM Final report  Final    Comment: (NOTE) These results were obtained using wet preparation(s) and trichrome stained smear. This test does not include testing for Cryptosporidium parvum, Cyclospora, or Microsporidia. Performed At: [facility] [address] [place], [place] [id number] Truddie Coco MD FW:[id number]    Source of Sample STOOL  Final    Comment: Performed at [facility], [address], [place], [place] [address]  Gastrointestinal Panel by PCR , Stool     Status: None   Collection Time: 11/06/20  1:25 PM   Specimen: Stool  Result Value Ref Range Status   Campylobacter species NOT DETECTED NOT DETECTED Final   Plesimonas shigelloides NOT DETECTED NOT DETECTED Final   Salmonella species NOT DETECTED NOT DETECTED Final   Yersinia enterocolitica NOT DETECTED NOT DETECTED Final   Vibrio species NOT  DETECTED NOT DETECTED Final   Vibrio cholerae NOT DETECTED NOT DETECTED Final   Enteroaggregative E coli (EAEC) NOT DETECTED NOT DETECTED Final   Enteropathogenic E coli (EPEC) NOT DETECTED NOT DETECTED Final   Enterotoxigenic E coli (ETEC) NOT DETECTED NOT DETECTED Final   Shiga like toxin producing E coli (STEC) NOT DETECTED NOT DETECTED Final   Shigella/Enteroinvasive E coli (EIEC) NOT DETECTED NOT DETECTED Final   Cryptosporidium NOT DETECTED NOT DETECTED Final   Cyclospora cayetanensis NOT DETECTED NOT DETECTED Final   Entamoeba histolytica NOT DETECTED NOT DETECTED Final   Giardia lamblia NOT DETECTED NOT DETECTED Final   Adenovirus  F40/41 NOT DETECTED NOT DETECTED Final   Astrovirus NOT DETECTED NOT DETECTED Final   Norovirus GI/GII NOT DETECTED NOT DETECTED Final   Rotavirus A NOT DETECTED NOT DETECTED Final   Sapovirus (I, II, IV, and V) NOT DETECTED NOT DETECTED Final    Comment: Performed at [facility], [address] [place], [place] [address]  SARS Coronavirus 2 by RT PCR (hospital order, performed in [place] hospital lab) Nasopharyngeal Nasopharyngeal Swab     Status: None   Collection Time: 11/11/20 10:49 AM   Specimen: Nasopharyngeal Swab  Result Value Ref Range Status   SARS Coronavirus 2 NEGATIVE NEGATIVE Final    Comment: Performed at [facility], [address], [place], [place] [address]         Radiology Studies: No results found.      Scheduled Meds: . (feeding supplement) PROSource Plus  30 mL Oral BID BM  . amiodarone  200 mg Oral Daily  . calcitRIOL  0.5 mcg Oral Daily  . Chlorhexidine Gluconate Cloth  6 each Topical Q0600  . Chlorhexidine Gluconate Cloth  6 each Topical Q0600  . [START ON 11/15/2020] colestipol  4 g Oral Daily  . darbepoetin (ARANESP) injection - DIALYSIS  100 mcg Intravenous Q Wed-HD  . famotidine  20 mg Oral Daily  . feeding supplement  237 mL Oral BID BM  . fludrocortisone  0.1 mg Oral Daily  . loperamide  2 mg Oral TID  . metoprolol tartrate  12.5 mg Oral BID  . midodrine  10 mg Oral TID with meals  . multivitamin  1 tablet Oral QHS  . potassium chloride  40 mEq Oral Once  . predniSONE  10 mg Oral Q breakfast  . simvastatin  20 mg Oral QPM  . sodium chloride flush  3 mL Intravenous Q12H   Continuous Infusions: . sodium chloride    . sodium chloride    . sodium chloride       LOS: 12 days    Time spent: 30 minutes    Kamaal Cast Darleen Crocker, DO [facility]  If 7PM-7AM, please contact night-coverage [email or web address] 11/14/2020, 3:14 PM

## 2020-11-14 NOTE — Progress Notes (Signed)
Pt indwelling foley removed on 11/13/20 at 1500. Patient noted to have void 150 in 8 hours. Performed bladder scan showing greater than 992. Order received to replace foley for urinary retention.

## 2020-11-14 NOTE — Progress Notes (Signed)
Subjective:  Patient with no complaints. He states he is passing a lot of gas. Liquid stool present in rectal tube. Denies n/v, abdominal pain. Unable to sit up or walk with rectal tube in place. Before this illness began a month ago, he was fully functional and did anything he wanted to do. Has had very limited mobility since being in the hospital started in early 10/2020.  Objective: Vital signs in last 24 hours: Temp:  [97.7 F (36.5 C)-98.4 F (36.9 C)] 98.4 F (36.9 C) (12/15 0345) Pulse Rate:  [64-77] 77 (12/15 0345) Resp:  [18-20] 18 (12/15 0345) BP: (139-148)/(72-77) 147/74 (12/15 0345) SpO2:  [99 %-100 %] 100 % (12/15 0345) Weight:  [72.4 kg] 72.4 kg (12/15 0439) Last BM Date: 11/13/20 General:   Alert,  Well-developed, well-nourished, pleasant and cooperative in NAD Head:  Normocephalic and atraumatic. Eyes:  Sclera clear, no icterus.  Abdomen:  Soft, nontender and nondistended. Normal bowel sounds, without guarding, and without rebound.   Rectal tube with liquid brown stool. 100cc present Extremities:  Without clubbing, deformity or edema. Neurologic:  Alert and  oriented x4;  grossly normal neurologically. Skin:  Intact without significant lesions or rashes. Psych:  Alert and cooperative. Normal mood and affect.  Intake/Output from previous day: 12/14 0701 - 12/15 0700 In: 123 [P.O.:120; I.V.:3] Out: 1900 [Urine:1750; Stool:150] Intake/Output this shift: No intake/output data recorded.  Lab Results: CBC Recent Labs    11/14/20 0544  WBC 5.8  HGB 7.9*  HCT 24.5*  MCV 93.2  PLT 183   BMET Recent Labs    11/12/20 0606 11/14/20 0544  NA 130* 131*  K 4.0 3.3*  CL 98 98  CO2 20* 24  GLUCOSE 105* 137*  BUN 43* 40*  CREATININE 3.09* 3.12*  CALCIUM 7.6* 7.2*   LFTs Recent Labs    11/12/20 0606 11/14/20 0544  ALBUMIN 2.3* 1.9*   No results for input(s): LIPASE in the last 72 hours. PT/INR No results for input(s): LABPROT, INR in the last 72 hours.     Imaging Studies: CT ABDOMEN PELVIS WO CONTRAST  Result Date: 10/23/2020 CLINICAL DATA:  Abdominal distension with gaseous distention of bowel on abdominal radiographs. Elevated creatinine. EXAM: CT ABDOMEN AND PELVIS WITHOUT CONTRAST TECHNIQUE: Multidetector CT imaging of the abdomen and pelvis was performed following the standard protocol without IV contrast. COMPARISON:  10/12/2020 FINDINGS: Lower chest: Small bilateral pleural effusions with bilateral basilar atelectasis. Effusions are increasing in size since previous study. Fluid in the distal esophagus suggesting reflux. Hepatobiliary: Normal appearance of the liver. The gallbladder is distended with multiple small stones present. No bile duct dilatation. Pancreas: Unremarkable. No pancreatic ductal dilatation or surrounding inflammatory changes. Spleen: Normal in size without focal abnormality. Adrenals/Urinary Tract: No adrenal gland nodules. Renal parenchyma is atrophic bilaterally. Multiple renal parenchymal calcifications, largest on the left measuring 5 mm in diameter. No hydronephrosis or hydroureter. No ureteral stones are identified. The bladder is normal. Stomach/Bowel: There is prominent diffuse distention of the colon throughout. Stool is demonstrated in the right hemicolon. Small amount of fluid in the descending colon. Rectal contrast material is present with a rectal tube in place. No wall thickening or inflammatory changes. The small bowel are decompressed. Moderately distended stomach with air-fluid levels. Vascular/Lymphatic: Aortic atherosclerosis. No enlarged abdominal or pelvic lymph nodes. Reproductive: Prostate is unremarkable. Other: No free air or free fluid in the abdomen. Abdominal wall musculature appears intact. Musculoskeletal: Degenerative changes in the spine and hips. No destructive bone lesions. IMPRESSION:  1. Prominent diffuse distention of the colon throughout with air-fluid levels. No evidence of small bowel  obstruction. No inflammatory changes or obstructing lesion identified. Changes likely to represent adynamic ileus. 2. Small bilateral pleural effusions with bilateral basilar atelectasis. Effusions are increasing in size since previous study. 3. Fluid in the distal esophagus suggesting reflux. 4. Cholelithiasis with distended gallbladder. 5. Bilateral renal atrophy with multiple renal parenchymal calcifications. No hydronephrosis or hydroureter. 6. Aortic atherosclerosis. Aortic Atherosclerosis (ICD10-I70.0). Electronically Signed   By: Lucienne Capers M.D.   On: 10/23/2020 00:15   DG CHEST PORT 1 VIEW  Result Date: 10/29/2020 CLINICAL DATA:  Hypoxia, orthostatic hypotension, atrial fibrillation EXAM: PORTABLE CHEST 1 VIEW COMPARISON:  10/12/2020 FINDINGS: Single frontal view of the chest demonstrates right internal jugular dialysis catheter unchanged. Cardiac silhouette is stable. Interval development of bibasilar veiling opacities compatible with consolidation and/or effusion. There is central vascular congestion. No pneumothorax. No acute bony abnormalities. IMPRESSION: 1. Findings compatible with fluid overload and bilateral pleural effusions. Electronically Signed   By: Randa Ngo M.D.   On: 10/29/2020 20:00   DG Abd Portable 1V  Result Date: 10/25/2020 CLINICAL DATA:  Abdominal distension. EXAM: PORTABLE ABDOMEN - 1 VIEW COMPARISON:  10/22/2020 FINDINGS: There is marked diffuse gaseous distension of the colon the appearance is not significantly changed when compared with 10/22/2020. A large stool burden is identified within the right colon, unchanged. No new findings. IMPRESSION: 1. No change in marked gaseous distension of the colon compatible with colonic ileus. 2. Unchanged large stool burden within the right colon. Electronically Signed   By: Kerby Moors M.D.   On: 10/25/2020 08:07   DG Abd Portable 2V  Result Date: 10/22/2020 CLINICAL DATA:  Abdominal distension.  History of bowel  obstruction. EXAM: PORTABLE ABDOMEN - 2 VIEW COMPARISON:  CT 10/12/2020.  Radiography 09/24/2020. FINDINGS: Gaseous distension of the intestine. Transverse colon measures up to 12 cm in diameter. Moderate stent shin of the small intestine as well, maximal diameter 3.5 cm. Moderate amount of fecal matter in the right colon. No sign of free air. The appearance could be due to pseudo obstruction, ileus or actual distal colon obstruction. Gaseous distension appears even more pronounced than on the study of 10 days ago. IMPRESSION: Gaseous distension of the intestine, even more pronounced than on the study of 10 days ago. The appearance could be due to pseudo obstruction, ileus or actual distal colon obstruction. Electronically Signed   By: Nelson Chimes M.D.   On: 10/22/2020 13:55   CT Angio Abd/Pel w/ and/or w/o  Result Date: 11/07/2020 CLINICAL DATA:  Evaluate for mesenteric ischemia. End-stage renal disease on dialysis. Bowel wall thickening on exam from 10/12/2020. EXAM: CTA ABDOMEN AND PELVIS WITHOUT AND WITH CONTRAST TECHNIQUE: Multidetector CT imaging of the abdomen and pelvis was performed using the standard protocol during bolus administration of intravenous contrast. Multiplanar reconstructed images and MIPs were obtained and reviewed to evaluate the vascular anatomy. CONTRAST:  91mL OMNIPAQUE IOHEXOL 350 MG/ML SOLN COMPARISON:  10/22/2020 FINDINGS: VASCULAR Aorta: Mild atherosclerotic disease in the abdominal aorta without aneurysm or dissection. Celiac: Celiac trunk is patent without significant stenosis. Focal dilatation of the trunk measures up to 9 mm. Main branch vessels are patent. SMA: Replaced right hepatic artery. SMA is widely patent without significant stenosis. No evidence for aneurysm or dissection. Renals: Main right renal artery is widely patent without stenosis, aneurysm or dissection. There is small accessory right renal artery. High-grade focal narrowing in the proximal main left  renal  artery without significant plaque in this area. Findings could be related to a dissection but indeterminate. Small accessory left renal artery is patent. IMA: Patent Inflow: Atherosclerotic disease involving the proximal left common iliac artery without significant stenosis. Common iliac arteries are patent bilaterally. Disease and stenosis involving the right internal iliac artery. Left internal iliac artery is patent. Bilateral external iliac arteries are widely patent. Proximal Outflow: Proximal femoral arteries are patent bilaterally. Veins: Main portal venous system is patent. Limited evaluation of the superior cavoatrial junction on the arterial phase imaging due to non-opacified blood in this area. IVC and renal veins are patent. Narrowing of the left common iliac vein from the right common iliac artery is a normal anatomic variant. Review of the MIP images confirms the above findings. NON-VASCULAR Lower chest: Small bilateral pleural effusions with compressive atelectasis. Hepatobiliary: High-density material in the gallbladder are suggestive for small stones. The gallbladder is moderately distended without definite inflammatory changes. There is colon anterior to the liver. No discrete liver lesion. No biliary dilatation. Gallbladder distension has minimally changed since the previous examination. Pancreas: Unremarkable. No pancreatic ductal dilatation or surrounding inflammatory changes. Spleen: Normal in size without focal abnormality. Adrenals/Urinary Tract: Normal appearance of the adrenal glands. Both kidneys are small with scattered calcifications. Findings are suggestive for nonobstructive renal calculi. Stomach/Bowel: Normal appearance of the stomach and duodenum. There is a rectal catheter with an inflated balloon. Sigmoid colon is moderately distended with mild wall thickening. However, the sigmoid wall thickening is less impressive on the venous phase imaging. Cecum is distended and contains a  large amount of stool. Transverse colon is distended with gas. Lymphatic: No significant lymph node enlargement in the abdomen or pelvis. Reproductive: Stable appearance of the prostate. Other: Diffuse subcutaneous edema. Small amount of pelvic ascites which is new. Trace ascites in the left lower quadrant of the abdomen. Trace ascites in the upper abdomen. Negative for free air. Musculoskeletal: Chronic disc space narrowing with endplate changes at Y7-C6. Stable disc space narrowing at L5-S1. IMPRESSION: VASCULAR 1. Atherosclerotic disease in the abdominal aorta without aneurysm or significant stenosis. Aortic Atherosclerosis (ICD10-I70.0). 2. Main mesenteric arteries are patent without significant stenosis. No evidence to suggest chronic mesenteric ischemia. 3. Multiple renal arteries as described. Focal narrowing the proximal left main renal artery that could be related to atherosclerotic disease or focal dissection in this area. NON-VASCULAR 1. The sigmoid colon has been partially decompressed since 10/22/2020 and placement of the rectal tube. There continues to be gaseous distension of the sigmoid colon and transverse colon. Mild wall thickening in the sigmoid colon is nonspecific but may be related to the partial decompression rather than infectious or inflammatory process. Findings are suggestive for an underlying colonic ileus. 2. Bilateral kidneys are atrophic with bilateral calcifications. Findings are compatible with history of end-stage renal disease. Calcifications could represent nonobstructive renal calculi. 3. Diffuse subcutaneous edema with small amount of ascites, most prominent in the pelvis. 4. Significant disc space disease at L2-L3. 5. Bilateral pleural effusions with compressive atelectasis at the lung bases. 6. Cholelithiasis with moderate gallbladder distension. Electronically Signed   By: Markus Daft M.D.   On: 11/07/2020 17:58  [2 weeks]   Assessment: 69 year old male well known to [place]  from recent several week admission for diarrhea and rectal bleeding, found to have CT findings (without contrast) of colitis,developing rectal bleeding during hospitalization in setting of Eliquis, receivingmultiple units ofPRBCs, undergoing colonoscopy during past recent admission with poor prep and incomplete exam. Blood  noted in rectum, sigmoid, descending colon, and at splenic flexure. Colonoscopy findings suspicious for segmental or ischemic colitis. Pathology without changes or features of IBD, query drug-induced or ischemic. CMV stains negative.He was started empirically on steroids with mild improvement in diarrhea. Prior hospitalization received empiric course of Cipro and Flagyl. Prior hospitalization negative Cdiff and GI pathogen panel.Patient presented back to the hospital 12/3 due to an episode of syncope while getting up to go to dialysis from SNF.  Diarrhea: Extensive evaluation with negative celiac serologies, negative C. difficile, negative GI pathogen panel and negative O&P.  Stool osmolality 627, stool sodium 43, stool potassium 76.  Osmotic gap 52 most consistent with secretory diarrhea.  Serum serotonin and urinary 5 HIAA pending.  24-hour urinary collection completed yesterday.  CT angio abdomen pelvis this admission with no evidence of mesenteric ischemia and noted to have continued gaseous distention of the sigmoid colon and transverse colon.  Mild wall thickening in the sigmoid colon felt to be nonspecific, findings suggestive of colonic ileus but overall wall thickening improved from prior CTs November.  Current bowel regimen includes Colestid 2 g daily, Imodium 2 mg 3 times daily, do not taper currently 10 mg daily.  Creon was discontinued yesterday.  Stools remain liquidy.  Overall felt that output has been decreasing.  Patient has limited mobility given ongoing rectal tube placement.  He denies any ongoing lightheadedness or orthostasis in several days.  Prior to admission  in November he was fully functional and able to complete all ADLs.  May be beneficial to discontinue rectal tube to allow physical therapy.  Plan: 1. Continue Imodium 2 mg 3 times daily. 2. Increase Colestid 4 grams daily, adjust timing to allow other meds to be given 1 hour before or 4 hours after Colestid. 3. Continue prednisone taper 4. Follow-up pending 5-HIAA results 5. Consider d/c rectal tube.  6. Consider PT.   Laureen Ochs. Bernarda Caffey [facility] [phone] 12/15/202110:59 AM     LOS: 12 days

## 2020-11-14 NOTE — Progress Notes (Signed)
Patient ID: [name], male   DOB: 25-Aug-1951, 69 y.o.   MRN: [id number] S: Feels better today but failed voiding trial and now has foley catheter again. O:BP (!) 147/74   Pulse 77   Temp 98.4 F (36.9 C)   Resp 18   Ht 6\' 2"  (1.88 m)   Wt 72.4 kg   SpO2 100%   BMI 20.49 kg/m   Intake/Output Summary (Last 24 hours) at 11/14/2020 0920 Last data filed at 11/14/2020 0300 Gross per 24 hour  Intake 123 ml  Output 1400 ml  Net -1277 ml   Intake/Output: I/O last 3 completed shifts: In: 40 [P.O.:120; I.V.:3] Out: 2437 [Urine:2275; Other:12; Stool:150]  Intake/Output this shift:  No intake/output data recorded. Weight change: 2.2 kg Gen: NAD CVS: RRR Resp: cta Abd: +BS, soft, NT/ND no abdominal binder in place Ext: trace ankle edema, TED stockings in place  Recent Labs  Lab 11/08/20 0510 11/09/20 1525 11/12/20 0606 11/14/20 0544  NA 131* 129* 130* 131*  K 3.4* 3.6 4.0 3.3*  CL 99 97* 98 98  CO2 24 21* 20* 24  GLUCOSE 185* 305* 105* 137*  BUN 30* 41* 43* 40*  CREATININE 2.57* 2.93* 3.09* 3.12*  ALBUMIN 2.0* 2.1* 2.3* 1.9*  CALCIUM 7.1* 7.1* 7.6* 7.2*  PHOS 2.2* 2.8 3.7 2.9   Liver Function Tests: Recent Labs  Lab 11/09/20 1525 11/12/20 0606 11/14/20 0544  ALBUMIN 2.1* 2.3* 1.9*   No results for input(s): LIPASE, AMYLASE in the last 168 hours. No results for input(s): AMMONIA in the last 168 hours. CBC: Recent Labs  Lab 11/07/20 1047 11/08/20 0510 11/09/20 0516 11/10/20 0447 11/14/20 0544  WBC 8.8 6.6 5.4 4.0 5.8  HGB 8.0* 7.5* 8.9* 8.1* 7.9*  HCT 25.7* 23.9* 28.7* 25.6* 24.5*  MCV 93.5 92.6 93.5 92.4 93.2  PLT 152 147* 157 161 183   Cardiac Enzymes: No results for input(s): CKTOTAL, CKMB, CKMBINDEX, TROPONINI in the last 168 hours. CBG: No results for input(s): GLUCAP in the last 168 hours.  Iron Studies: No results for input(s): IRON, TIBC, TRANSFERRIN, FERRITIN in the last 72 hours. Studies/Results: No results found. . (feeding supplement)  PROSource Plus  30 mL Oral BID BM  . amiodarone  200 mg Oral Daily  . calcitRIOL  0.5 mcg Oral Daily  . Chlorhexidine Gluconate Cloth  6 each Topical Q0600  . Chlorhexidine Gluconate Cloth  6 each Topical Q0600  . colestipol  2 g Oral Daily  . darbepoetin (ARANESP) injection - DIALYSIS  100 mcg Intravenous Q Wed-HD  . famotidine  20 mg Oral Daily  . feeding supplement  237 mL Oral BID BM  . fludrocortisone  0.1 mg Oral Daily  . loperamide  2 mg Oral TID  . metoprolol tartrate  12.5 mg Oral BID  . midodrine  10 mg Oral TID with meals  . multivitamin  1 tablet Oral QHS  . predniSONE  10 mg Oral Q breakfast  . simvastatin  20 mg Oral QPM  . sodium chloride flush  3 mL Intravenous Q12H    BMET    Component Value Date/Time   NA 131 (L) 11/14/2020 0544   K 3.3 (L) 11/14/2020 0544   CL 98 11/14/2020 0544   CO2 24 11/14/2020 0544   GLUCOSE 137 (H) 11/14/2020 0544   BUN 40 (H) 11/14/2020 0544   CREATININE 3.12 (H) 11/14/2020 0544   CALCIUM 7.2 (L) 11/14/2020 0544   GFRNONAA 21 (L) 11/14/2020 0544   GFRAA 19 (  L) 06/15/2018 0407   CBC    Component Value Date/Time   WBC 5.8 11/14/2020 0544   RBC 2.63 (L) 11/14/2020 0544   HGB 7.9 (L) 11/14/2020 0544   HCT 24.5 (L) 11/14/2020 0544   PLT 183 11/14/2020 0544   MCV 93.2 11/14/2020 0544   MCH 30.0 11/14/2020 0544   MCHC 32.2 11/14/2020 0544   RDW 20.5 (H) 11/14/2020 0544   LYMPHSABS 0.5 (L) 11/02/2020 1209   MONOABS 1.2 (H) 11/02/2020 1209   EOSABS 0.0 11/02/2020 1209   BASOSABS 0.1 11/02/2020 1209    Assessment/Plan:  1. RecurrentSyncope/hypotension; sig orthostasis:On TID Midodrine. HD with minimal UFdue to good UOP. 1. Please make sure he is wearing an abdominal binder and compression stockings 2. Need to keep head of bed raised 30-45 degrees 3. He also needs PT assistance with orthostatic vital signs.  No orthostatic vitals signs documented this week. 4. Also need to control diarrhea which is likely contributing as  well. 5. Keep even with HD. 6. Please follow orthostatics (none documented) and ambulate with PT as tolerated. 2. Colitis- Ranee Gosselin been following and he completed a course ofcipro/flagyl. He had colonoscopy on 10/17/20 which was a poor prep and incomplete exam but did noteblood in the rectum and sigmoid colon, in the descending colon and at the splenic flexure; colitis involving descending and sigmoid colons seen. Biopsies performed. Per GI and surgery. His diarrhea persists and now has a flexseal. Currently on prednisone taper,creon, colestid, and imodium.  3. Urinary retention- this is new. Pt with significant UOP. Improved with foley catheter and may need outpatient Urology evaluation. Unclear if this is drug related or due to BPH 4. [facility] on [facility].  Will hold off on HD today due to high ESRD census and normal labs.  Plan for HD tomorrow and again on Saturday.  Will get back on Schedule Monday if he is still here. 5. Anemia:of CKD- s/p blood transfusion. No heparin with HD. GI on board, colonoscopyfindings as above. Rec 1u prbc 11/22 1. Transfuse as needed to keep Hgb >7 2. On Aranesp weekly. 6. 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 Resumecalcitriol 0.25mcg daily (was on this as an outpatient). Calcium correctsto8.7. 7. Severe protein malnutrition:per primary, push protein 8. H/o Hypertension:see #1 9. Vascular access- will need to f/u with Dr. Donnetta Hutching once stable for discharge. 10. A fib- on amiodarone and eliquis (off A/C as of right now) 11. Hyponatremia. Monitor 12. Disposition- pending ability to safely ambulate before discharge.  Donetta Potts, MD [name] [phone]

## 2020-11-14 NOTE — Progress Notes (Signed)
Nutrition Follow-up  DOCUMENTATION CODES:   Not applicable  INTERVENTION:  D/c ProSource Plus d/t pt dislike  Continue Ensure Enlive po BID, each supplement provides 350 kcal and 20 grams of protein  Continue Rena-vit po QHS  2x protein with dinner and breakfast  Encouraged po intake of meals and supplements  NUTRITION DIAGNOSIS:   Increased nutrient needs related to chronic illness (ESRD on HD) as evidenced by estimated needs. -ongoing  GOAL:   Patient will meet greater than or equal to 90% of their needs -progressing, meal intake improving and drinking ONS  MONITOR:   Labs,I & O's,Supplement acceptance,PO intake,Weight trends,Skin  REASON FOR ASSESSMENT:   Consult Assessment of nutrition requirement/status  ASSESSMENT:   69 year old male with history significant for DM2, CKD stage V recently started on HD, atrial fibrillation/flutter, recurrent orthostatic hypotension, HLD, who was recently hospitalized and discharged to SNF on 12/2 for colitis with associated adynamic ileus and acute blood loss anemia presented from facility after an episode of syncope while getting up to go to hemodialysis.  Patient awake, alert sitting up in bed this afternoon. He reports feeling some better today, endorses good appetite, recalls eating most all of his lunch and continues to drink Ensure twice daily. He does not like ProSource supplement, has tried mixing it with juice or gingerale as RD suggested and unable to drink it. Will discontinue ProSource today. RD educated on the importance of protein, pt agreeable to double protein portions with dinner and breakfast.  Meal intake 40-100% (67% average x 8 documented meals 12/6-12/14)  Weights have remained stable over the past week; +4 lbs since admit I/Os: -8087 ml since admit UOP: 1750 ml x 24 hours  Last HD 12/13 Net UF 12 ml  Medications reviewed and include: Calcitriol, Aranesp, Pepcid, Imodium, Rena-vit, Klor-con,  Prednisone  Labs: Na 131 (L), K 3.3 (L), BUN 40 (H), Cr 3.12 (H), P 2.9 (WNL), Hgb 7.9 (L), HCT 24.5 (L)   Diet Order:   Diet Order            Diet renal with fluid restriction Fluid restriction: 1200 mL Fluid; Room service appropriate? Yes; Fluid consistency: Thin  Diet effective now                 EDUCATION NEEDS:   Not appropriate for education at this time  Skin:  Skin Integrity Issues:: Stage II Stage II: sacrum (11/13)  Last BM:  12/14 (150 ml rectal tube)  Height:   Ht Readings from Last 1 Encounters:  11/02/20 6\' 2"  (1.88 m)    Weight:   Wt Readings from Last 1 Encounters:  11/14/20 72.4 kg    BMI:  Body mass index is 20.49 kg/m.  Estimated Nutritional Needs:   Kcal:  2127-2410  Protein:  115-130  Fluid:  UOP + 1000 ml   Lajuan Lines, RD, LDN Clinical Nutrition After Hours/Weekend Pager # [phone]

## 2020-11-15 ENCOUNTER — Inpatient Hospital Stay (HOSPITAL_COMMUNITY): Payer: Medicare Other

## 2020-11-15 DIAGNOSIS — K529 Noninfective gastroenteritis and colitis, unspecified: Secondary | ICD-10-CM

## 2020-11-15 DIAGNOSIS — R197 Diarrhea, unspecified: Secondary | ICD-10-CM

## 2020-11-15 LAB — BASIC METABOLIC PANEL
Anion gap: 9 (ref 5–15)
BUN: 47 mg/dL — ABNORMAL HIGH (ref 8–23)
CO2: 21 mmol/L — ABNORMAL LOW (ref 22–32)
Calcium: 7.3 mg/dL — ABNORMAL LOW (ref 8.9–10.3)
Chloride: 97 mmol/L — ABNORMAL LOW (ref 98–111)
Creatinine, Ser: 2.99 mg/dL — ABNORMAL HIGH (ref 0.61–1.24)
GFR, Estimated: 22 mL/min — ABNORMAL LOW (ref >60)
Glucose, Bld: 159 mg/dL — ABNORMAL HIGH (ref 70–99)
Potassium: 3.9 mmol/L (ref 3.5–5.1)
Sodium: 127 mmol/L — ABNORMAL LOW (ref 135–145)

## 2020-11-15 LAB — CBC
HCT: 27 % — ABNORMAL LOW (ref 39.0–52.0)
Hemoglobin: 8.3 g/dL — ABNORMAL LOW (ref 13.0–17.0)
MCH: 29 pg (ref 26.0–34.0)
MCHC: 30.7 g/dL (ref 30.0–36.0)
MCV: 94.4 fL (ref 80.0–100.0)
Platelets: 195 10*3/uL (ref 150–400)
RBC: 2.86 MIL/uL — ABNORMAL LOW (ref 4.22–5.81)
RDW: 20.2 % — ABNORMAL HIGH (ref 11.5–15.5)
WBC: 5.1 10*3/uL (ref 4.0–10.5)
nRBC: 0 % (ref 0.0–0.2)

## 2020-11-15 LAB — MAGNESIUM: Magnesium: 1.5 mg/dL — ABNORMAL LOW (ref 1.7–2.4)

## 2020-11-15 MED ORDER — HEPARIN SODIUM (PORCINE) 1000 UNIT/ML DIALYSIS
20.0000 [IU]/kg | INTRAMUSCULAR | Status: DC | PRN
  Administered 2020-11-15 – 2020-11-30 (×4): 1400 [IU] via INTRAVENOUS_CENTRAL
  Filled 2020-11-15 (×4): qty 2

## 2020-11-15 MED ORDER — ALTEPLASE 2 MG IJ SOLR
4.0000 mg | Freq: Once | INTRAMUSCULAR | Status: AC | PRN
  Administered 2020-11-15: 4 mg

## 2020-11-15 MED ORDER — MAGNESIUM SULFATE 2 GM/50ML IV SOLN
2.0000 g | Freq: Once | INTRAVENOUS | Status: AC
  Administered 2020-11-15: 2 g via INTRAVENOUS
  Filled 2020-11-15: qty 50

## 2020-11-15 MED ORDER — OCTREOTIDE ACETATE 100 MCG/ML IJ SOLN
50.0000 ug | Freq: Two times a day (BID) | INTRAMUSCULAR | Status: DC
  Administered 2020-11-15 – 2020-11-17 (×4): 50 ug via SUBCUTANEOUS
  Filled 2020-11-15 (×8): qty 0.5

## 2020-11-15 NOTE — Progress Notes (Signed)
Patient ID: [name], male   DOB: 1951/09/25, 69 y.o.   MRN: [id number]   S: Still no abdominal binder, no orthostatic vitals. No complaints since he's been pretty much bed bound. He does report rolling around in bed and tries to sit up and has been asymptomatic. Otherwise no complaints.  O:BP (!) 165/88 (BP Location: Left Arm)   Pulse 74   Temp 97.9 F (36.6 C)   Resp 16   Ht 6\' 2"  (1.88 m)   Wt 72.6 kg   SpO2 99%   BMI 20.55 kg/m   Intake/Output Summary (Last 24 hours) at 11/15/2020 0916 Last data filed at 11/15/2020 0517 Gross per 24 hour  Intake 480 ml  Output 1525 ml  Net -1045 ml   Intake/Output: I/O last 3 completed shifts: In: 750 [P.O.:720; Other:30] Out: 3350 [[id number]]  Intake/Output this shift:  No intake/output data recorded. Weight change: 0.2 kg Gen: NAD CVS: RRR Resp: cta Abd: +BS, soft, NT/ND no abdominal binder in place Ext: trace ankle edema, TED stockings in place Access: rij tdc c/d/i  Recent Labs  Lab 11/09/20 1525 11/12/20 0606 11/14/20 0544 11/15/20 0627  NA 129* 130* 131* 127*  K 3.6 4.0 3.3* 3.9  CL 97* 98 98 97*  CO2 21* 20* 24 21*  GLUCOSE 305* 105* 137* 159*  BUN 41* 43* 40* 47*  CREATININE 2.93* 3.09* 3.12* 2.99*  ALBUMIN 2.1* 2.3* 1.9*  --   CALCIUM 7.1* 7.6* 7.2* 7.3*  PHOS 2.8 3.7 2.9  --    Liver Function Tests: Recent Labs  Lab 11/09/20 1525 11/12/20 0606 11/14/20 0544  ALBUMIN 2.1* 2.3* 1.9*   No results for input(s): LIPASE, AMYLASE in the last 168 hours. No results for input(s): AMMONIA in the last 168 hours. CBC: Recent Labs  Lab 11/09/20 0516 11/10/20 0447 11/14/20 0544 11/15/20 0627  WBC 5.4 4.0 5.8 5.1  HGB 8.9* 8.1* 7.9* 8.3*  HCT 28.7* 25.6* 24.5* 27.0*  MCV 93.5 92.4 93.2 94.4  PLT 157 161 183 195   Cardiac Enzymes: No results for input(s): CKTOTAL, CKMB, CKMBINDEX, TROPONINI in the last 168 hours. CBG: No results for input(s): GLUCAP in the last 168 hours.  Iron Studies: No results for  input(s): IRON, TIBC, TRANSFERRIN, FERRITIN in the last 72 hours. Studies/Results: DG Abd Portable 1V  Result Date: 11/15/2020 CLINICAL DATA:  Abdominal distension EXAM: PORTABLE ABDOMEN - 1 VIEW COMPARISON:  10/25/2020 FINDINGS: The subdiaphragmatic region and right flank are excluded from view. Multiple gas-filled prominent loops of large and small bowel are seen throughout the visualized abdomen in keeping with un underlying ileus. No gross free intraperitoneal gas. Gas and stool is seen within the rectal vault. No organomegaly. No acute bone abnormality. IMPRESSION: Mild ileus. Electronically Signed   By: Fidela Salisbury MD   On: 11/15/2020 04:15   . amiodarone  200 mg Oral Daily  . calcitRIOL  0.5 mcg Oral Daily  . Chlorhexidine Gluconate Cloth  6 each Topical Q0600  . Chlorhexidine Gluconate Cloth  6 each Topical Q0600  . colestipol  4 g Oral Daily  . darbepoetin (ARANESP) injection - DIALYSIS  100 mcg Intravenous Q Wed-HD  . famotidine  20 mg Oral Daily  . feeding supplement  237 mL Oral BID BM  . fludrocortisone  0.1 mg Oral Daily  . loperamide  2 mg Oral TID  . metoprolol tartrate  12.5 mg Oral BID  . midodrine  10 mg Oral TID with meals  . multivitamin  1 tablet Oral QHS  . predniSONE  10 mg Oral Q breakfast  . simvastatin  20 mg Oral QPM  . sodium chloride flush  3 mL Intravenous Q12H    BMET    Component Value Date/Time   NA 127 (L) 11/15/2020 0627   K 3.9 11/15/2020 0627   CL 97 (L) 11/15/2020 0627   CO2 21 (L) 11/15/2020 0627   GLUCOSE 159 (H) 11/15/2020 0627   BUN 47 (H) 11/15/2020 0627   CREATININE 2.99 (H) 11/15/2020 0627   CALCIUM 7.3 (L) 11/15/2020 0627   GFRNONAA 22 (L) 11/15/2020 0627   GFRAA 19 (L) 06/15/2018 0407   CBC    Component Value Date/Time   WBC 5.1 11/15/2020 0627   RBC 2.86 (L) 11/15/2020 0627   HGB 8.3 (L) 11/15/2020 0627   HCT 27.0 (L) 11/15/2020 0627   PLT 195 11/15/2020 0627   MCV 94.4 11/15/2020 0627   MCH 29.0 11/15/2020 0627    MCHC 30.7 11/15/2020 0627   RDW 20.2 (H) 11/15/2020 0627   LYMPHSABS 0.5 (L) 11/02/2020 1209   MONOABS 1.2 (H) 11/02/2020 1209   EOSABS 0.0 11/02/2020 1209   BASOSABS 0.1 11/02/2020 1209    Assessment/Plan:  1. RecurrentSyncope/hypotension; sig orthostasis:On TID Midodrine and florinef. HD with minimal UFdue to good UOP. 1. Please make sure he is wearing an abdominal binder and compression stockings 2. Need to keep head of bed raised 30-45 degrees 3. He also needs PT assistance with orthostatic vital signs.  No orthostatic vitals signs documented this week. 4. Also need to control diarrhea which is likely contributing as well. 5. Keep even with HD. 6. Please follow orthostatics (none documented) and ambulate with PT as tolerated. 2. Colitis- Ranee Gosselin been following and he completed a course ofcipro/flagyl. He had colonoscopy on 10/17/20 which was a poor prep and incomplete exam but did noteblood in the rectum and sigmoid colon, in the descending colon and at the splenic flexure; colitis involving descending and sigmoid colons seen. Biopsies performed. Per GI and surgery. His diarrhea persists and now has a flexseal. Currently on prednisone taper,creon, colestid, and imodium. 5-HIAA leves pending 3. Urinary retention- this is new. Pt with significant UOP. Improved with foley catheter and may need outpatient Urology evaluation. Unclear if this is drug related or due to BPH 4. [facility] on M[facility] at [facility].  Change in in-house HD schedule given high patient census: Plan for HD today and again on Saturday.  Will get back on Schedule Monday if he is still here. 5. Anemia:of CKD- s/p blood transfusion. No heparin with HD. GI on board, colonoscopyfindings as above. Rec 1u prbc 11/22 1. Transfuse as needed to keep Hgb >7 2. On Aranesp weekly. 6. 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 Resumecalcitriol 0.57mcg daily (was on this as an outpatient). Calcium  correctsto8.7. 7. Severe protein malnutrition:per primary, push protein 8. H/o Hypertension:see #1 9. Vascular access- will need to f/u with Dr. Donnetta Hutching once stable for discharge. 10. A fib- on amiodarone and eliquis (off A/C as of right now) 11. Hyponatremia. Monitor, 137Na bath 12. Disposition- pending ability to safely ambulate before discharge.  Gean Quint, MD [facility]

## 2020-11-15 NOTE — Plan of Care (Signed)
  Problem: Acute Rehab PT Goals(only PT should resolve) Goal: Pt Will Go Supine/Side To Sit Outcome: Progressing Flowsheets (Taken 11/15/2020 1353) Pt will go Supine/Side to Sit: with supervision Goal: Patient Will Transfer Sit To/From Stand Outcome: Progressing Flowsheets (Taken 11/15/2020 1353) Patient will transfer sit to/from stand:  with minimal assist  with moderate assist Goal: Pt Will Transfer Bed To Chair/Chair To Bed Outcome: Progressing Flowsheets (Taken 11/15/2020 1353) Pt will Transfer Bed to Chair/Chair to Bed:  with min assist  with mod assist Goal: Pt Will Ambulate Outcome: Progressing Flowsheets (Taken 11/15/2020 1353) Pt will Ambulate:  25 feet  with minimal assist  with rolling walker   1:54 PM, 11/15/20 Lonell Grandchild, MPT Physical Therapist with [facility] [phone] office [phone] mobile phone

## 2020-11-15 NOTE — Progress Notes (Signed)
Subjective:  No complaints. Tolerating diet. No abdominal pain.  Objective: Vital signs in last 24 hours: Temp:  [97.8 F (36.6 C)-98 F (36.7 C)] 97.9 F (36.6 C) (12/16 0514) Pulse Rate:  [67-74] 74 (12/16 0514) Resp:  [16-18] 16 (12/16 0514) BP: (143-165)/(75-88) 165/88 (12/16 0514) SpO2:  [98 %-100 %] 99 % (12/16 0514) Weight:  [72.6 kg] 72.6 kg (12/16 0500) Last BM Date: 11/13/20 General:   Alert,  Well-developed, well-nourished, pleasant and cooperative in NAD Head:  Normocephalic and atraumatic. Eyes:  Sclera clear, no icterus.  Abdomen:  Soft, nontender and nondistended. Normal bowel sounds, without guarding, and without rebound.   Extremities:  Without clubbing, deformity or edema. Neurologic:  Alert and  oriented x4;  grossly normal neurologically.   Intake/Output from previous day: 12/15 0701 - 12/16 0700 In: 750 [P.O.:720] Out: 2600 [Urine:2600] Intake/Output this shift: No intake/output data recorded.  Lab Results: CBC Recent Labs    11/14/20 0544 11/15/20 0627  WBC 5.8 5.1  HGB 7.9* 8.3*  HCT 24.5* 27.0*  MCV 93.2 94.4  PLT 183 195   BMET Recent Labs    11/14/20 0544 11/15/20 0627  NA 131* 127*  K 3.3* 3.9  CL 98 97*  CO2 24 21*  GLUCOSE 137* 159*  BUN 40* 47*  CREATININE 3.12* 2.99*  CALCIUM 7.2* 7.3*   LFTs Recent Labs    11/14/20 0544  ALBUMIN 1.9*   No results for input(s): LIPASE in the last 72 hours. PT/INR No results for input(s): LABPROT, INR in the last 72 hours.    Imaging Studies: CT ABDOMEN PELVIS WO CONTRAST  Result Date: 10/23/2020 CLINICAL DATA:  Abdominal distension with gaseous distention of bowel on abdominal radiographs. Elevated creatinine. EXAM: CT ABDOMEN AND PELVIS WITHOUT CONTRAST TECHNIQUE: Multidetector CT imaging of the abdomen and pelvis was performed following the standard protocol without IV contrast. COMPARISON:  10/12/2020 FINDINGS: Lower chest: Small bilateral pleural effusions with bilateral basilar  atelectasis. Effusions are increasing in size since previous study. Fluid in the distal esophagus suggesting reflux. Hepatobiliary: Normal appearance of the liver. The gallbladder is distended with multiple small stones present. No bile duct dilatation. Pancreas: Unremarkable. No pancreatic ductal dilatation or surrounding inflammatory changes. Spleen: Normal in size without focal abnormality. Adrenals/Urinary Tract: No adrenal gland nodules. Renal parenchyma is atrophic bilaterally. Multiple renal parenchymal calcifications, largest on the left measuring 5 mm in diameter. No hydronephrosis or hydroureter. No ureteral stones are identified. The bladder is normal. Stomach/Bowel: There is prominent diffuse distention of the colon throughout. Stool is demonstrated in the right hemicolon. Small amount of fluid in the descending colon. Rectal contrast material is present with a rectal tube in place. No wall thickening or inflammatory changes. The small bowel are decompressed. Moderately distended stomach with air-fluid levels. Vascular/Lymphatic: Aortic atherosclerosis. No enlarged abdominal or pelvic lymph nodes. Reproductive: Prostate is unremarkable. Other: No free air or free fluid in the abdomen. Abdominal wall musculature appears intact. Musculoskeletal: Degenerative changes in the spine and hips. No destructive bone lesions. IMPRESSION: 1. Prominent diffuse distention of the colon throughout with air-fluid levels. No evidence of small bowel obstruction. No inflammatory changes or obstructing lesion identified. Changes likely to represent adynamic ileus. 2. Small bilateral pleural effusions with bilateral basilar atelectasis. Effusions are increasing in size since previous study. 3. Fluid in the distal esophagus suggesting reflux. 4. Cholelithiasis with distended gallbladder. 5. Bilateral renal atrophy with multiple renal parenchymal calcifications. No hydronephrosis or hydroureter. 6. Aortic atherosclerosis.  Aortic Atherosclerosis (ICD10-I70.0). Electronically  Signed   By: Lucienne Capers M.D.   On: 10/23/2020 00:15   DG CHEST PORT 1 VIEW  Result Date: 10/29/2020 CLINICAL DATA:  Hypoxia, orthostatic hypotension, atrial fibrillation EXAM: PORTABLE CHEST 1 VIEW COMPARISON:  10/12/2020 FINDINGS: Single frontal view of the chest demonstrates right internal jugular dialysis catheter unchanged. Cardiac silhouette is stable. Interval development of bibasilar veiling opacities compatible with consolidation and/or effusion. There is central vascular congestion. No pneumothorax. No acute bony abnormalities. IMPRESSION: 1. Findings compatible with fluid overload and bilateral pleural effusions. Electronically Signed   By: Randa Ngo M.D.   On: 10/29/2020 20:00   DG Abd Portable 1V  Result Date: 11/15/2020 CLINICAL DATA:  Abdominal distension EXAM: PORTABLE ABDOMEN - 1 VIEW COMPARISON:  10/25/2020 FINDINGS: The subdiaphragmatic region and right flank are excluded from view. Multiple gas-filled prominent loops of large and small bowel are seen throughout the visualized abdomen in keeping with un underlying ileus. No gross free intraperitoneal gas. Gas and stool is seen within the rectal vault. No organomegaly. No acute bone abnormality. IMPRESSION: Mild ileus. Electronically Signed   By: Fidela Salisbury MD   On: 11/15/2020 04:15   DG Abd Portable 1V  Result Date: 10/25/2020 CLINICAL DATA:  Abdominal distension. EXAM: PORTABLE ABDOMEN - 1 VIEW COMPARISON:  10/22/2020 FINDINGS: There is marked diffuse gaseous distension of the colon the appearance is not significantly changed when compared with 10/22/2020. A large stool burden is identified within the right colon, unchanged. No new findings. IMPRESSION: 1. No change in marked gaseous distension of the colon compatible with colonic ileus. 2. Unchanged large stool burden within the right colon. Electronically Signed   By: Kerby Moors M.D.   On: 10/25/2020 08:07    DG Abd Portable 2V  Result Date: 10/22/2020 CLINICAL DATA:  Abdominal distension.  History of bowel obstruction. EXAM: PORTABLE ABDOMEN - 2 VIEW COMPARISON:  CT 10/12/2020.  Radiography 09/24/2020. FINDINGS: Gaseous distension of the intestine. Transverse colon measures up to 12 cm in diameter. Moderate stent shin of the small intestine as well, maximal diameter 3.5 cm. Moderate amount of fecal matter in the right colon. No sign of free air. The appearance could be due to pseudo obstruction, ileus or actual distal colon obstruction. Gaseous distension appears even more pronounced than on the study of 10 days ago. IMPRESSION: Gaseous distension of the intestine, even more pronounced than on the study of 10 days ago. The appearance could be due to pseudo obstruction, ileus or actual distal colon obstruction. Electronically Signed   By: Nelson Chimes M.D.   On: 10/22/2020 13:55   CT Angio Abd/Pel w/ and/or w/o  Result Date: 11/07/2020 CLINICAL DATA:  Evaluate for mesenteric ischemia. End-stage renal disease on dialysis. Bowel wall thickening on exam from 10/12/2020. EXAM: CTA ABDOMEN AND PELVIS WITHOUT AND WITH CONTRAST TECHNIQUE: Multidetector CT imaging of the abdomen and pelvis was performed using the standard protocol during bolus administration of intravenous contrast. Multiplanar reconstructed images and MIPs were obtained and reviewed to evaluate the vascular anatomy. CONTRAST:  27mL OMNIPAQUE IOHEXOL 350 MG/ML SOLN COMPARISON:  10/22/2020 FINDINGS: VASCULAR Aorta: Mild atherosclerotic disease in the abdominal aorta without aneurysm or dissection. Celiac: Celiac trunk is patent without significant stenosis. Focal dilatation of the trunk measures up to 9 mm. Main branch vessels are patent. SMA: Replaced right hepatic artery. SMA is widely patent without significant stenosis. No evidence for aneurysm or dissection. Renals: Main right renal artery is widely patent without stenosis, aneurysm or dissection.  There is  small accessory right renal artery. High-grade focal narrowing in the proximal main left renal artery without significant plaque in this area. Findings could be related to a dissection but indeterminate. Small accessory left renal artery is patent. IMA: Patent Inflow: Atherosclerotic disease involving the proximal left common iliac artery without significant stenosis. Common iliac arteries are patent bilaterally. Disease and stenosis involving the right internal iliac artery. Left internal iliac artery is patent. Bilateral external iliac arteries are widely patent. Proximal Outflow: Proximal femoral arteries are patent bilaterally. Veins: Main portal venous system is patent. Limited evaluation of the superior cavoatrial junction on the arterial phase imaging due to non-opacified blood in this area. IVC and renal veins are patent. Narrowing of the left common iliac vein from the right common iliac artery is a normal anatomic variant. Review of the MIP images confirms the above findings. NON-VASCULAR Lower chest: Small bilateral pleural effusions with compressive atelectasis. Hepatobiliary: High-density material in the gallbladder are suggestive for small stones. The gallbladder is moderately distended without definite inflammatory changes. There is colon anterior to the liver. No discrete liver lesion. No biliary dilatation. Gallbladder distension has minimally changed since the previous examination. Pancreas: Unremarkable. No pancreatic ductal dilatation or surrounding inflammatory changes. Spleen: Normal in size without focal abnormality. Adrenals/Urinary Tract: Normal appearance of the adrenal glands. Both kidneys are small with scattered calcifications. Findings are suggestive for nonobstructive renal calculi. Stomach/Bowel: Normal appearance of the stomach and duodenum. There is a rectal catheter with an inflated balloon. Sigmoid colon is moderately distended with mild wall thickening. However, the  sigmoid wall thickening is less impressive on the venous phase imaging. Cecum is distended and contains a large amount of stool. Transverse colon is distended with gas. Lymphatic: No significant lymph node enlargement in the abdomen or pelvis. Reproductive: Stable appearance of the prostate. Other: Diffuse subcutaneous edema. Small amount of pelvic ascites which is new. Trace ascites in the left lower quadrant of the abdomen. Trace ascites in the upper abdomen. Negative for free air. Musculoskeletal: Chronic disc space narrowing with endplate changes at I9-C7. Stable disc space narrowing at L5-S1. IMPRESSION: VASCULAR 1. Atherosclerotic disease in the abdominal aorta without aneurysm or significant stenosis. Aortic Atherosclerosis (ICD10-I70.0). 2. Main mesenteric arteries are patent without significant stenosis. No evidence to suggest chronic mesenteric ischemia. 3. Multiple renal arteries as described. Focal narrowing the proximal left main renal artery that could be related to atherosclerotic disease or focal dissection in this area. NON-VASCULAR 1. The sigmoid colon has been partially decompressed since 10/22/2020 and placement of the rectal tube. There continues to be gaseous distension of the sigmoid colon and transverse colon. Mild wall thickening in the sigmoid colon is nonspecific but may be related to the partial decompression rather than infectious or inflammatory process. Findings are suggestive for an underlying colonic ileus. 2. Bilateral kidneys are atrophic with bilateral calcifications. Findings are compatible with history of end-stage renal disease. Calcifications could represent nonobstructive renal calculi. 3. Diffuse subcutaneous edema with small amount of ascites, most prominent in the pelvis. 4. Significant disc space disease at L2-L3. 5. Bilateral pleural effusions with compressive atelectasis at the lung bases. 6. Cholelithiasis with moderate gallbladder distension. Electronically Signed    By: Markus Daft M.D.   On: 11/07/2020 17:58  [2 weeks]   Assessment: 69 year old male well-known to [place] from recent several week admission for diarrhea and rectal bleeding, found to have CT findings (without contrast) of colitis, developing rectal bleeding during hospitalization in the setting of Eliquis, receiving multiple units  of packed red blood cells, undergoing colonoscopy during past recent admission with poor prep and incomplete exam. Blood noted in the rectum, sigmoid, descending colon, splenic flexure.Colonoscopy findings suspicious for segmental or ischemic colitis. Pathology without changes or features of IBD, query drug-induced or ischemic. CMV stains negative.He was started empirically on steroids with mild improvement in diarrhea. Prior hospitalization received empiric course of Cipro and Flagyl. Prior hospitalization negative Cdiff and GI pathogen panel.Patient presented back to the hospital 12/3 due to an episode of syncope while getting up to go to dialysis from SNF  Diarrhea: Extensive evaluation with negative celiac serologies, negative C. difficile, negative GI pathogen panel, negative O+P. Stool osmolality 627, stool sodium 43, stool potassium 76, osmotic gap 52. Serotonin level <5. 5HIAA pending. CTA abdomen pelvis with no evidence of mesenteric ischemia and noted to have continued gaseous distention of the sigmoid colon and transverse colon. Mild wall thickening in the sigmoid colon felt to be nonspecific, findings suggestive of colonic ileus but overall wall thickening improved from prior CTs back in November. Abd film today with mild ileus. Bowel regimen includes Imodium 2 mg 3 times daily, prednisone taper, currently 10 mg daily. Creon recently discontinued. Increased Colestid to 4 g daily yesterday. Stool output appears to be decreasing, stool remains liquid. He had rectal tube bag changed at 5pm yesterday and no stool in the bag, there is liquid stool in the tube from the rectum  to the bag.  Patient has limited mobility given ongoing rectal tube placement.  He denies any ongoing lightheadedness or orthostasis in several days.  Orthostatics pending for today. Prior to admission in November he was fully functional and able to complete all ADLs.  May be beneficial to discontinue rectal tube to allow physical therapy   Plan: 1. D/C rectal tube.  2. Strict I/Os. 3. F/u pending studies. 4. Continue current bowel regimen.  5. F/u pending orthostatics.   Laureen Ochs. Bernarda Caffey [facility] [phone] 12/16/202110:59 AM      LOS: 13 days

## 2020-11-15 NOTE — Evaluation (Signed)
Physical Therapy Evaluation Patient Details Name: [name] MRN: [id number] DOB: Mar 10, 1951 Today's Date: 11/15/2020   History of Present Illness  [name] is a 69 y.o. male with medical history significant for type 2 diabetes, CKD stage V recently initiated on hemodialysis MWF, atrial fibrillation/flutter on amiodarone and metoprolol and Eliquis, recurrent orthostatic hypotension, dyslipidemia, and recent discharge on 12/2 for colitis with associated adynamic ileus and acute blood loss anemia who presented back to the ED today after he was noted to have an episode of syncope while getting up to go to hemodialysis from his SNF.  On arrival to the ED his systolic blood pressures were in the 70 mmHg range.  He is noted to have some diarrhea, but he has recently completed course of ciprofloxacin and Flagyl by 11/23.  He was noted to have nonspecific colitis on pathology after recent colonoscopy on 11/17 and was started on prednisone taper which he continues to take at this time.    Clinical Impression  Patient demonstrates slow labored movement for sitting up at bedside, at high risk for falls due to BLE weakness, poor standing balance and limited to a few side steps at bedside.  Patient unable to sit up in chair due to BP dropping to 75/53 after functional activity.  Orthostatics BP's as follows: lying flat 158/87, sitting 125/76, standing 129/105, and sitting up at bedside after activities 75/53.  Patient put back to bed after therapy.  Patient will benefit from continued physical therapy in hospital and recommended venue below to increase strength, balance, endurance for safe ADLs and gait.     Follow Up Recommendations SNF    Equipment Recommendations  Rolling walker with 5" wheels    Recommendations for Other Services       Precautions / Restrictions Precautions Precautions: Fall Precaution Comments: monitor BP Restrictions Weight Bearing Restrictions: No      Mobility  Bed  Mobility Overal bed mobility: Needs Assistance Bed Mobility: Supine to Sit;Sit to Supine     Supine to sit: Min assist Sit to supine: Min guard   General bed mobility comments: increased time, labored movement    Transfers Overall transfer level: Needs assistance Equipment used: Rolling walker (2 wheeled) Transfers: Sit to/from Stand Sit to Stand: Mod assist         General transfer comment: difficulty completing sit to stands due to BLE weakness  Ambulation/Gait Ambulation/Gait assistance: Mod assist Gait Distance (Feet): 4 Feet Assistive device: Rolling walker (2 wheeled) Gait Pattern/deviations: Decreased step length - right;Decreased step length - left;Decreased stride length Gait velocity: slow   General Gait Details: limited to 4-5 slow labored unsteady side steps due to c/o fatigue, dizziness, poor standing balance  Stairs            Wheelchair Mobility    Modified Rankin (Stroke Patients Only)       Balance Overall balance assessment: Needs assistance Sitting-balance support: Feet supported;No upper extremity supported Sitting balance-Leahy Scale: Fair Sitting balance - Comments: fair/good seated at bedside   Standing balance support: During functional activity;Bilateral upper extremity supported Standing balance-Leahy Scale: Poor Standing balance comment: fair/poor using RW                             Pertinent Vitals/Pain Pain Assessment: No/denies pain    Home Living Family/patient expects to be discharged to:: Private residence Living Arrangements: Children Available Help at Discharge: Family;Available PRN/intermittently Type of Home: [place]  Access: Stairs to enter Entrance Stairs-Rails: Left Entrance Stairs-Number of Steps: 2-3 in back, 8 steps in front Home Layout: Two level;Able to live on main level with bedroom/bathroom;Full bath on main level;Other (Comment) Home Equipment: Walker - 2 wheels;Cane - single  point;Shower seat - built in      Prior Function Level of Independence: Independent         Comments: Hydrographic surveyor, drives     Journalist, newspaper        Extremity/Trunk Assessment   Upper Extremity Assessment Upper Extremity Assessment: Generalized weakness    Lower Extremity Assessment Lower Extremity Assessment: Generalized weakness    Cervical / Trunk Assessment Cervical / Trunk Assessment: Normal  Communication   Communication: No difficulties  Cognition Arousal/Alertness: Awake/alert Behavior During Therapy: WFL for tasks assessed/performed Overall Cognitive Status: Within Functional Limits for tasks assessed                                        General Comments      Exercises     Assessment/Plan    PT Assessment Patient needs continued PT services  PT Problem List Decreased strength;Decreased activity tolerance;Decreased mobility;Decreased balance       PT Treatment Interventions DME instruction;Gait training;Stair training;Functional mobility training;Therapeutic activities;Therapeutic exercise;Patient/family education;Balance training    PT Goals (Current goals can be found in the Care Plan section)  Acute Rehab PT Goals Patient Stated Goal: return home after rehab PT Goal Formulation: With patient Time For Goal Achievement: 11/29/20 Potential to Achieve Goals: Good    Frequency Min 3X/week   Barriers to discharge        Co-evaluation               AM-PAC PT "6 Clicks" Mobility  Outcome Measure Help needed turning from your back to your side while in a flat bed without using bedrails?: None Help needed moving from lying on your back to sitting on the side of a flat bed without using bedrails?: A Little Help needed moving to and from a bed to a chair (including a wheelchair)?: A Lot Help needed standing up from a chair using your arms (e.g., wheelchair or bedside chair)?: A Lot Help needed to walk in hospital  room?: A Lot Help needed climbing 3-5 steps with a railing? : Total 6 Click Score: 14    End of Session   Activity Tolerance: Patient tolerated treatment well;Patient limited by fatigue Patient left: in bed;with call bell/phone within reach Nurse Communication: Mobility status PT Visit Diagnosis: Unsteadiness on feet (R26.81);Other abnormalities of gait and mobility (R26.89);Muscle weakness (generalized) (M62.81)    Time: 6754-4920 PT Time Calculation (min) (ACUTE ONLY): 34 min   Charges:   PT Evaluation $PT Eval Moderate Complexity: 1 Mod PT Treatments $Therapeutic Activity: 23-37 mins        1:51 PM, 11/15/20 Lonell Grandchild, MPT Physical Therapist with [facility] 336 [phone] office [phone] mobile phone

## 2020-11-15 NOTE — Progress Notes (Addendum)
PROGRESS NOTE    [name]  QJJ:[id number] DOB: Jul 10, 1951 DOA: 11/02/2020 PCP: Patient, No Pcp Per   Brief Narrative:  Per HPI: [name] a 69 y.o.malewith medical history significant fortype 2 diabetes, CKD stage V recently initiated on hemodialysis MWF, atrial fibrillation/flutter on amiodarone and metoprolol and Eliquis, recurrent orthostatic hypotension, dyslipidemia, and recent discharge on 12/2 for colitis with associated adynamic ileus and acute blood loss anemia who presented back to the ED today after he was noted to have an episode of syncope while getting up to go to hemodialysis from his SNF. On arrival to the ED his systolic blood pressures were in the 70 mmHg range. He is noted to have some diarrhea, but he has recently completed course of ciprofloxacin and Flagyl by 11/23. He was noted to have nonspecific colitis on pathology after recent colonoscopy on 11/17 and was started on prednisone taper which he continues to take at this time.  -Patient continues to remain orthostatic and therefore continues to blame blood. This is complicated by has ongoing colitis on high liquid stool output. GI reconsulted on 12/6 to continue further evaluation. Creon and Imodium started 3 times daily on 12/6 however patient continues to have copious stool output, have been unable to remove rectal tube. 12/10: stool seems to be slowing down some.    Assessment & Plan:   Active Problems:   Syncope due to orthostatic hypotension   ESRD on hemodialysis (HCC)   Syncope secondary to recurrent orthostatic hypotension -Continue midodrine as previous - added holding parameters -continue Florinef -Lower extremity stockingswith abdominal binder -Plan for hemodialysis per nephrologynext session 12/16 -Repeat orthostatics performed today with significant ongoing orthostasis with standing even with abdominal binder and compression stockings present. -Negative fluid balance  noted  Lactic acidosis - RESOLVED -Likely due toearliersoft blood pressure readings with no signs of other acute infection noted  Hyponatremia -Adjustment of hemodialysis per nephrology  Ongoing colitis with diarrhea - stool seems to be slowing down some -Continue prednisone taper by 10 mg/week per GI  -Recent treatment with ciprofloxacin and Flagyl for 10 days completed -Continue on Imodium 3 times daily as well as Colestid 4 g daily as adjusted per GI -5-HIAA results pending -Repeat Cdiff negative -GI ongoing consultation appreciated; plan to DC rectal tube today and allow for some PT.  ESRD on HD MWF -Appreciate nephrology evaluation for hemodialysis while inpatient,havinghemodialysis with minimal fluid removal  Acute urinary retention on 12/13 Bladder scan with >999 mL seen -I/O cath prn if not voiding - May need cath  - Urology follow up needed -Question if this is drug related or due to BPH  Chronic anemia-stable -Received 5 units of PRBCs during his last admission -Continue to hold apixaban per prior admission recommendations -No overt bleeding noted -Hemoglobin remains stable -CBC being monitored  Paroxysmal atrial fibrillation/flutter -Continue close monitoring on telemetry -Resumed home amiodarone and metoprolol -Hold apixaban per cardiology recommendations on prior admission  Dyslipidemia -Continue Zocor  Hypoalbuminemia -Likely mild to moderate protein calorie malnutrition -Dietitian consultation appreciated  DVT prophylaxis:SCDs Code Status:Full Family Communication:Pt prefers to call himself; none at bedside Disposition Plan: SNF when medically stabilized and GI sign off; studies still pending Status is: Inpatient  Remains inpatient appropriate because:IV treatments appropriate due to intensity of illness or inability to take PO and Inpatient level of care appropriate due to severity of illness  Dispo: The patient is  from:SNF Anticipated d/c is to:SNF Anticipated d/c date is:1-2days Patient currently is not medically stable  to d/c.Will plan to discharge once stool output has improved and patient less orthostatic. GI work-up ongoing. Ongoing hemodialysis treatments. Pt developed acute urinary retention that will require some monitoring as he still does make urine.   Consultants:   Nephrology  GI  Procedures:   See below  Antimicrobials:   None  Subjective: Patient seen and evaluated today with no new acute complaints or concerns. No acute concerns or events noted overnight.  Objective: Vitals:   11/14/20 1500 11/14/20 2037 11/15/20 0500 11/15/20 0514  BP: (!) 143/75 (!) 147/75  (!) 165/88  Pulse: 67 69  74  Resp:  18  16  Temp: 98 F (36.7 C) 97.8 F (36.6 C)  97.9 F (36.6 C)  TempSrc: Oral     SpO2: 100% 98%  99%  Weight:   72.6 kg   Height:        Intake/Output Summary (Last 24 hours) at 11/15/2020 1205 Last data filed at 11/15/2020 0517 Gross per 24 hour  Intake 480 ml  Output 1525 ml  Net -1045 ml   Filed Weights   11/12/20 1930 11/14/20 0439 11/15/20 0500  Weight: 70.2 kg 72.4 kg 72.6 kg    Examination:  General exam: Appears calm and comfortable  Respiratory system: Clear to auscultation. Respiratory effort normal. Cardiovascular system: S1 & S2 heard, RRR.  Gastrointestinal system: Abdomen is nondistended, soft and nontender.  Central nervous system: Alert and awake Extremities: No edema Skin: No rashes, lesions or ulcers Psychiatry: Judgement and insight appear normal. Mood & affect appropriate.  Colostomy bag with ongoing brown, liquidy stool output Foley catheter with clear yellow urine output    Data Reviewed: I have personally reviewed following labs and imaging studies  CBC: Recent Labs  Lab 11/09/20 0516 11/10/20 0447 11/14/20 0544 11/15/20 0627  WBC 5.4 4.0 5.8 5.1  HGB 8.9* 8.1* 7.9*  8.3*  HCT 28.7* 25.6* 24.5* 27.0*  MCV 93.5 92.4 93.2 94.4  PLT 157 161 183 443   Basic Metabolic Panel: Recent Labs  Lab 11/09/20 1525 11/12/20 0606 11/14/20 0544 11/15/20 0627  NA 129* 130* 131* 127*  K 3.6 4.0 3.3* 3.9  CL 97* 98 98 97*  CO2 21* 20* 24 21*  GLUCOSE 305* 105* 137* 159*  BUN 41* 43* 40* 47*  CREATININE 2.93* 3.09* 3.12* 2.99*  CALCIUM 7.1* 7.6* 7.2* 7.3*  MG  --   --   --  1.5*  PHOS 2.8 3.7 2.9  --    GFR: Estimated Creatinine Clearance: 23.6 mL/min (A) (by C-G formula based on SCr of 2.99 mg/dL (H)). Liver Function Tests: Recent Labs  Lab 11/09/20 1525 11/12/20 0606 11/14/20 0544  ALBUMIN 2.1* 2.3* 1.9*   No results for input(s): LIPASE, AMYLASE in the last 168 hours. No results for input(s): AMMONIA in the last 168 hours. Coagulation Profile: No results for input(s): INR, PROTIME in the last 168 hours. Cardiac Enzymes: No results for input(s): CKTOTAL, CKMB, CKMBINDEX, TROPONINI in the last 168 hours. BNP (last 3 results) No results for input(s): PROBNP in the last 8760 hours. HbA1C: No results for input(s): HGBA1C in the last 72 hours. CBG: No results for input(s): GLUCAP in the last 168 hours. Lipid Profile: No results for input(s): CHOL, HDL, LDLCALC, TRIG, CHOLHDL, LDLDIRECT in the last 72 hours. Thyroid Function Tests: No results for input(s): TSH, T4TOTAL, FREET4, T3FREE, THYROIDAB in the last 72 hours. Anemia Panel: No results for input(s): VITAMINB12, FOLATE, FERRITIN, TIBC, IRON, RETICCTPCT in the last 72 hours.  Sepsis Labs: No results for input(s): PROCALCITON, LATICACIDVEN in the last 168 hours.  Recent Results (from the past 240 hour(s))  OVA + PARASITE EXAM     Status: None   Collection Time: 11/05/20  3:44 PM   Specimen: Per Rectum; Stool  Result Value Ref Range Status   OVA + PARASITE EXAM Final report  Final    Comment: (NOTE) These results were obtained using wet preparation(s) and trichrome stained smear. This test  does not include testing for Cryptosporidium parvum, Cyclospora, or Microsporidia. Performed At: [facility] [place], [place] [id number] Truddie Coco MD [id number]    Source of Sample STOOL  Final    Comment: Performed at [facility], [address], [place], [place] [address]  Gastrointestinal Panel by PCR , Stool     Status: None   Collection Time: 11/06/20  1:25 PM   Specimen: Stool  Result Value Ref Range Status   Campylobacter species NOT DETECTED NOT DETECTED Final   Plesimonas shigelloides NOT DETECTED NOT DETECTED Final   Salmonella species NOT DETECTED NOT DETECTED Final   Yersinia enterocolitica NOT DETECTED NOT DETECTED Final   Vibrio species NOT DETECTED NOT DETECTED Final   Vibrio cholerae NOT DETECTED NOT DETECTED Final   Enteroaggregative E coli (EAEC) NOT DETECTED NOT DETECTED Final   Enteropathogenic E coli (EPEC) NOT DETECTED NOT DETECTED Final   Enterotoxigenic E coli (ETEC) NOT DETECTED NOT DETECTED Final   Shiga like toxin producing E coli (STEC) NOT DETECTED NOT DETECTED Final   Shigella/Enteroinvasive E coli (EIEC) NOT DETECTED NOT DETECTED Final   Cryptosporidium NOT DETECTED NOT DETECTED Final   Cyclospora cayetanensis NOT DETECTED NOT DETECTED Final   Entamoeba histolytica NOT DETECTED NOT DETECTED Final   Giardia lamblia NOT DETECTED NOT DETECTED Final   Adenovirus F40/41 NOT DETECTED NOT DETECTED Final   Astrovirus NOT DETECTED NOT DETECTED Final   Norovirus GI/GII NOT DETECTED NOT DETECTED Final   Rotavirus A NOT DETECTED NOT DETECTED Final   Sapovirus (I, II, IV, and V) NOT DETECTED NOT DETECTED Final    Comment: Performed at [facility], [address] [place], [place] [address]  SARS Coronavirus 2 by RT PCR (hospital order, performed in [place] hospital lab) Nasopharyngeal Nasopharyngeal Swab     Status: None   Collection Time: 11/11/20 10:49 AM   Specimen: Nasopharyngeal Swab  Result Value Ref Range  Status   SARS Coronavirus 2 NEGATIVE NEGATIVE Final    Comment: Performed at [facility], [address], [place], [place] [address]         Radiology Studies: DG Abd Portable 1V  Result Date: 11/15/2020 CLINICAL DATA:  Abdominal distension EXAM: PORTABLE ABDOMEN - 1 VIEW COMPARISON:  10/25/2020 FINDINGS: The subdiaphragmatic region and right flank are excluded from view. Multiple gas-filled prominent loops of large and small bowel are seen throughout the visualized abdomen in keeping with un underlying ileus. No gross free intraperitoneal gas. Gas and stool is seen within the rectal vault. No organomegaly. No acute bone abnormality. IMPRESSION: Mild ileus. Electronically Signed   By: Fidela Salisbury MD   On: 11/15/2020 04:15        Scheduled Meds: . amiodarone  200 mg Oral Daily  . calcitRIOL  0.5 mcg Oral Daily  . Chlorhexidine Gluconate Cloth  6 each Topical Q0600  . Chlorhexidine Gluconate Cloth  6 each Topical Q0600  . colestipol  4 g Oral Daily  . darbepoetin (ARANESP) injection - DIALYSIS  100 mcg Intravenous Q Wed-HD  . famotidine  20 mg Oral Daily  . feeding supplement  237 mL Oral BID BM  . fludrocortisone  0.1 mg Oral Daily  . loperamide  2 mg Oral TID  . metoprolol tartrate  12.5 mg Oral BID  . midodrine  10 mg Oral TID with meals  . multivitamin  1 tablet Oral QHS  . predniSONE  10 mg Oral Q breakfast  . simvastatin  20 mg Oral QPM  . sodium chloride flush  3 mL Intravenous Q12H   Continuous Infusions: . sodium chloride    . sodium chloride    . sodium chloride    . magnesium sulfate bolus IVPB       LOS: 13 days    Time spent: 30 minutes    Janel Beane Darleen Crocker, DO [facility]  If 7PM-7AM, please contact night-coverage [email or web address] 11/15/2020, 12:05 PM

## 2020-11-16 ENCOUNTER — Inpatient Hospital Stay (HOSPITAL_COMMUNITY): Payer: Medicare Other

## 2020-11-16 DIAGNOSIS — K922 Gastrointestinal hemorrhage, unspecified: Secondary | ICD-10-CM

## 2020-11-16 LAB — BASIC METABOLIC PANEL
Anion gap: 10 (ref 5–15)
BUN: 27 mg/dL — ABNORMAL HIGH (ref 8–23)
CO2: 24 mmol/L (ref 22–32)
Calcium: 7.4 mg/dL — ABNORMAL LOW (ref 8.9–10.3)
Chloride: 97 mmol/L — ABNORMAL LOW (ref 98–111)
Creatinine, Ser: 2.07 mg/dL — ABNORMAL HIGH (ref 0.61–1.24)
GFR, Estimated: 34 mL/min — ABNORMAL LOW (ref >60)
Glucose, Bld: 137 mg/dL — ABNORMAL HIGH (ref 70–99)
Potassium: 3.5 mmol/L (ref 3.5–5.1)
Sodium: 131 mmol/L — ABNORMAL LOW (ref 135–145)

## 2020-11-16 LAB — CBC
HCT: 27.2 % — ABNORMAL LOW (ref 39.0–52.0)
Hemoglobin: 8.5 g/dL — ABNORMAL LOW (ref 13.0–17.0)
MCH: 29.4 pg (ref 26.0–34.0)
MCHC: 31.3 g/dL (ref 30.0–36.0)
MCV: 94.1 fL (ref 80.0–100.0)
Platelets: 171 10*3/uL (ref 150–400)
RBC: 2.89 MIL/uL — ABNORMAL LOW (ref 4.22–5.81)
RDW: 20 % — ABNORMAL HIGH (ref 11.5–15.5)
WBC: 5.3 10*3/uL (ref 4.0–10.5)
nRBC: 0 % (ref 0.0–0.2)

## 2020-11-16 LAB — MAGNESIUM: Magnesium: 1.7 mg/dL (ref 1.7–2.4)

## 2020-11-16 NOTE — Progress Notes (Signed)
PROGRESS NOTE    VEDDER BRITTIAN  XIH:[id number] DOB: 03-29-1951 DOA: 11/02/2020 PCP: Patient, No Pcp Per   Brief Narrative:  Per HPI: [name] a 69 y.o.malewith medical history significant fortype 2 diabetes, CKD stage V recently initiated on hemodialysis MWF, atrial fibrillation/flutter on amiodarone and metoprolol and Eliquis, recurrent orthostatic hypotension, dyslipidemia, and recent discharge on 12/2 for colitis with associated adynamic ileus and acute blood loss anemia who presented back to the ED today after he was noted to have an episode of syncope while getting up to go to hemodialysis from his SNF. On arrival to the ED his systolic blood pressures were in the 70 mmHg range. He is noted to have some diarrhea, but he has recently completed course of ciprofloxacin and Flagyl by 11/23. He was noted to have nonspecific colitis on pathology after recent colonoscopy on 11/17 and was started on prednisone taper which he continues to take at this time.  -Patient continues to remain orthostatic and therefore continues to blame blood. This is complicated by has ongoing colitis on high liquid stool output. GI reconsulted on 12/6 to continue further evaluation. Creon and Imodium started 3 times daily on 12/6 however patient continues to have copious stool output, have been unable to remove rectal tube. 12/10: stool seems to be slowing down some.   Assessment & Plan:   Active Problems:   Syncope due to orthostatic hypotension   ESRD on hemodialysis (HCC)   Syncope secondary to recurrent orthostatic hypotension -Continue midodrine as previous - added holding parameters -continue Florinef -Lower extremity stockingswith abdominal binder -Plan for hemodialysis per nephrologynext session 12/16 -Repeat orthostatics performed today with significant ongoing orthostasis with standing even with abdominal binder and compression stockings present. -Negative fluid balance noted -Plan to  check brain MRI for deposition disease as discussed with nephrology  Lactic acidosis - RESOLVED -Likely due toearliersoft blood pressure readings with no signs of other acute infection noted  Hyponatremia -Adjustment of hemodialysis per nephrology  Ongoing colitis with diarrhea - stool seems to be slowing down some -Continue prednisone taper by 10 mg/week per GI  -Recent treatment with ciprofloxacin and Flagyl for 10 days completed -Continue on Imodium 3 times daily as well as Colestid 4 g daily as adjusted per GI -Started on octreotide 12/16 and rectal tube discontinued -5-HIAA results pending -Repeat Cdiff negative -GIongoingconsultation appreciated; plan to DC rectal tube today and allow for some PT.  ESRD on HD MWF -Appreciate nephrology evaluation for hemodialysis while inpatient,havinghemodialysis with minimal fluid removal  Acute urinary retention on 12/13 Bladder scan with >999 mL seen -I/O cath prn if not voiding - May need cath  - Urology follow up needed -Question if this is drug related or due to BPH  Chronic anemia-stable -Received 5 units of PRBCs during his last admission -Continue to hold apixaban per prior admission recommendations -No overt bleeding noted -Hemoglobin remains stable -CBC being monitored  Paroxysmal atrial fibrillation/flutter -Continue close monitoring on telemetry -Resumed home amiodarone and metoprolol -Hold apixaban per cardiology recommendations on prior admission  Dyslipidemia -Continue Zocor  Hypoalbuminemia -Likely mild to moderate protein calorie malnutrition -Dietitian consultation appreciated  DVT prophylaxis:SCDs Code Status:Full Family Communication:Pt prefers to call himself; none at bedside Disposition Plan: SNF when medically stabilized and GI sign off; studies still pending Status is: Inpatient  Remains inpatient appropriate because:IV treatments appropriate due to intensity of illness or  inability to take PO and Inpatient level of care appropriate due to severity of illness  Dispo: The  patient is from:SNF Anticipated d/c is to:SNF Anticipated d/c date is:2-3days Patient currently is not medically stable to d/c.Will plan to discharge once stool output has improved and patient less orthostatic. GI work-up ongoing. Ongoing hemodialysis treatments. Pt developed acute urinary retention that will require some monitoring as he still does make urine.  Consultants:  Nephrology  GI  Procedures:  See below  Antimicrobials:   None  Subjective: Patient seen and evaluated today with no new acute complaints or concerns. No acute concerns or events noted overnight.  He states that his stool output has diminished and he continues to have gas.  Objective: Vitals:   11/15/20 2345 11/15/20 2355 11/16/20 0500 11/16/20 0518  BP: (!) 150/81 (!) 155/79  (!) 138/106  Pulse: 81 84  87  Resp:  16  20  Temp:  98.1 F (36.7 C)  98.2 F (36.8 C)  TempSrc:  Oral  Oral  SpO2:    98%  Weight:   66.8 kg   Height:        Intake/Output Summary (Last 24 hours) at 11/16/2020 1124 Last data filed at 11/16/2020 0900 Gross per 24 hour  Intake 720 ml  Output 2000 ml  Net -1280 ml   Filed Weights   11/15/20 0500 11/15/20 2015 11/16/20 0500  Weight: 72.6 kg 72.4 kg 66.8 kg    Examination:  General exam: Appears calm and comfortable  Respiratory system: Clear to auscultation. Respiratory effort normal. Cardiovascular system: S1 & S2 heard, RRR.  Gastrointestinal system: Abdomen is nondistended, soft and nontender.  Central nervous system: Alert and oriented. No focal neurological deficits. Extremities: Symmetric 5 x 5 power. Skin: No rashes, lesions or ulcers Psychiatry: Judgement and insight appear normal. Mood & affect appropriate.  Foley with clear, yellow urine output noted    Data Reviewed: I have personally reviewed  following labs and imaging studies  CBC: Recent Labs  Lab 11/10/20 0447 11/14/20 0544 11/15/20 0627 11/16/20 0528  WBC 4.0 5.8 5.1 5.3  HGB 8.1* 7.9* 8.3* 8.5*  HCT 25.6* 24.5* 27.0* 27.2*  MCV 92.4 93.2 94.4 94.1  PLT 161 183 195 409   Basic Metabolic Panel: Recent Labs  Lab 11/09/20 1525 11/12/20 0606 11/14/20 0544 11/15/20 0627 11/16/20 0528  NA 129* 130* 131* 127* 131*  K 3.6 4.0 3.3* 3.9 3.5  CL 97* 98 98 97* 97*  CO2 21* 20* 24 21* 24  GLUCOSE 305* 105* 137* 159* 137*  BUN 41* 43* 40* 47* 27*  CREATININE 2.93* 3.09* 3.12* 2.99* 2.07*  CALCIUM 7.1* 7.6* 7.2* 7.3* 7.4*  MG  --   --   --  1.5* 1.7  PHOS 2.8 3.7 2.9  --   --    GFR: Estimated Creatinine Clearance: 31.4 mL/min (A) (by C-G formula based on SCr of 2.07 mg/dL (H)). Liver Function Tests: Recent Labs  Lab 11/09/20 1525 11/12/20 0606 11/14/20 0544  ALBUMIN 2.1* 2.3* 1.9*   No results for input(s): LIPASE, AMYLASE in the last 168 hours. No results for input(s): AMMONIA in the last 168 hours. Coagulation Profile: No results for input(s): INR, PROTIME in the last 168 hours. Cardiac Enzymes: No results for input(s): CKTOTAL, CKMB, CKMBINDEX, TROPONINI in the last 168 hours. BNP (last 3 results) No results for input(s): PROBNP in the last 8760 hours. HbA1C: No results for input(s): HGBA1C in the last 72 hours. CBG: No results for input(s): GLUCAP in the last 168 hours. Lipid Profile: No results for input(s): CHOL, HDL, LDLCALC, TRIG, CHOLHDL, LDLDIRECT  in the last 72 hours. Thyroid Function Tests: No results for input(s): TSH, T4TOTAL, FREET4, T3FREE, THYROIDAB in the last 72 hours. Anemia Panel: No results for input(s): VITAMINB12, FOLATE, FERRITIN, TIBC, IRON, RETICCTPCT in the last 72 hours. Sepsis Labs: No results for input(s): PROCALCITON, LATICACIDVEN in the last 168 hours.  Recent Results (from the past 240 hour(s))  Gastrointestinal Panel by PCR , Stool     Status: None   Collection  Time: 11/06/20  1:25 PM   Specimen: Stool  Result Value Ref Range Status   Campylobacter species NOT DETECTED NOT DETECTED Final   Plesimonas shigelloides NOT DETECTED NOT DETECTED Final   Salmonella species NOT DETECTED NOT DETECTED Final   Yersinia enterocolitica NOT DETECTED NOT DETECTED Final   Vibrio species NOT DETECTED NOT DETECTED Final   Vibrio cholerae NOT DETECTED NOT DETECTED Final   Enteroaggregative E coli (EAEC) NOT DETECTED NOT DETECTED Final   Enteropathogenic E coli (EPEC) NOT DETECTED NOT DETECTED Final   Enterotoxigenic E coli (ETEC) NOT DETECTED NOT DETECTED Final   Shiga like toxin producing E coli (STEC) NOT DETECTED NOT DETECTED Final   Shigella/Enteroinvasive E coli (EIEC) NOT DETECTED NOT DETECTED Final   Cryptosporidium NOT DETECTED NOT DETECTED Final   Cyclospora cayetanensis NOT DETECTED NOT DETECTED Final   Entamoeba histolytica NOT DETECTED NOT DETECTED Final   Giardia lamblia NOT DETECTED NOT DETECTED Final   Adenovirus F40/41 NOT DETECTED NOT DETECTED Final   Astrovirus NOT DETECTED NOT DETECTED Final   Norovirus GI/GII NOT DETECTED NOT DETECTED Final   Rotavirus A NOT DETECTED NOT DETECTED Final   Sapovirus (I, II, IV, and V) NOT DETECTED NOT DETECTED Final    Comment: Performed at [facility], [address] [place], [place] [address]  SARS Coronavirus 2 by RT PCR (hospital order, performed in [place] hospital lab) Nasopharyngeal Nasopharyngeal Swab     Status: None   Collection Time: 11/11/20 10:49 AM   Specimen: Nasopharyngeal Swab  Result Value Ref Range Status   SARS Coronavirus 2 NEGATIVE NEGATIVE Final    Comment: Performed at [facility], [address] [place], [place] [address]         Radiology Studies: DG Abd Portable 1V  Result Date: 11/15/2020 CLINICAL DATA:  Abdominal distension EXAM: PORTABLE ABDOMEN - 1 VIEW COMPARISON:  10/25/2020 FINDINGS: The subdiaphragmatic region and right flank are excluded from view.  Multiple gas-filled prominent loops of large and small bowel are seen throughout the visualized abdomen in keeping with un underlying ileus. No gross free intraperitoneal gas. Gas and stool is seen within the rectal vault. No organomegaly. No acute bone abnormality. IMPRESSION: Mild ileus. Electronically Signed   By: Fidela Salisbury MD   On: 11/15/2020 04:15        Scheduled Meds: . amiodarone  200 mg Oral Daily  . calcitRIOL  0.5 mcg Oral Daily  . Chlorhexidine Gluconate Cloth  6 each Topical Q0600  . Chlorhexidine Gluconate Cloth  6 each Topical Q0600  . colestipol  4 g Oral Daily  . darbepoetin (ARANESP) injection - DIALYSIS  100 mcg Intravenous Q Wed-HD  . famotidine  20 mg Oral Daily  . feeding supplement  237 mL Oral BID BM  . fludrocortisone  0.1 mg Oral Daily  . loperamide  2 mg Oral TID  . metoprolol tartrate  12.5 mg Oral BID  . midodrine  10 mg Oral TID with meals  . multivitamin  1 tablet Oral QHS  . octreotide  50 mcg Subcutaneous Q12H  . predniSONE  10 mg Oral Q breakfast  . simvastatin  20 mg Oral QPM  . sodium chloride flush  3 mL Intravenous Q12H   Continuous Infusions: . sodium chloride    . sodium chloride    . sodium chloride       LOS: 14 days    Time spent: 30 minutes    Tashi Band Darleen Crocker, DO [facility]  If 7PM-7AM, please contact night-coverage [email or web address] 11/16/2020, 11:24 AM

## 2020-11-16 NOTE — Progress Notes (Signed)
Patient ID: [name], male   DOB: [birth year], 69 y.o.   MRN: [id number]   S: still has orthostatic hypotension with PT especially with abd binder and compression stockings. Tolerated HD yesterday with no UF. uop 1.8L  O:BP (!) 138/106 (BP Location: Left Arm)   Pulse 87   Temp 98.2 F (36.8 C) (Oral)   Resp 20   Ht 6\' 2"  (1.88 m)   Wt 66.8 kg   SpO2 98%   BMI 18.91 kg/m   Intake/Output Summary (Last 24 hours) at 11/16/2020 1039 Last data filed at 11/16/2020 0900 Gross per 24 hour  Intake 720 ml  Output 2000 ml  Net -1280 ml   Intake/Output: I/O last 3 completed shifts: In: 47 [P.O.:720] Out: 2800 [Urine:2600; Stool:200]  Intake/Output this shift:  Total I/O In: 240 [P.O.:240] Out: -  Weight change: -0.2 kg Gen: NAD CVS: RRR Resp: cta Abd: +BS, soft, NT/ND abdominal binder in place Ext: no edema, TED stockings in place Access: rij tdc c/d/i  Recent Labs  Lab 11/09/20 1525 11/12/20 0606 11/14/20 0544 11/15/20 0627 11/16/20 0528  NA 129* 130* 131* 127* 131*  K 3.6 4.0 3.3* 3.9 3.5  CL 97* 98 98 97* 97*  CO2 21* 20* 24 21* 24  GLUCOSE 305* 105* 137* 159* 137*  BUN 41* 43* 40* 47* 27*  CREATININE 2.93* 3.09* 3.12* 2.99* 2.07*  ALBUMIN 2.1* 2.3* 1.9*  --   --   CALCIUM 7.1* 7.6* 7.2* 7.3* 7.4*  PHOS 2.8 3.7 2.9  --   --    Liver Function Tests: Recent Labs  Lab 11/09/20 1525 11/12/20 0606 11/14/20 0544  ALBUMIN 2.1* 2.3* 1.9*   No results for input(s): LIPASE, AMYLASE in the last 168 hours. No results for input(s): AMMONIA in the last 168 hours. CBC: Recent Labs  Lab 11/10/20 0447 11/14/20 0544 11/15/20 0627 11/16/20 0528  WBC 4.0 5.8 5.1 5.3  HGB 8.1* 7.9* 8.3* 8.5*  HCT 25.6* 24.5* 27.0* 27.2*  MCV 92.4 93.2 94.4 94.1  PLT 161 183 195 171   Cardiac Enzymes: No results for input(s): CKTOTAL, CKMB, CKMBINDEX, TROPONINI in the last 168 hours. CBG: No results for input(s): GLUCAP in the last 168 hours.  Iron Studies: No results for  input(s): IRON, TIBC, TRANSFERRIN, FERRITIN in the last 72 hours. Studies/Results: DG Abd Portable 1V  Result Date: 11/15/2020 CLINICAL DATA:  Abdominal distension EXAM: PORTABLE ABDOMEN - 1 VIEW COMPARISON:  10/25/2020 FINDINGS: The subdiaphragmatic region and right flank are excluded from view. Multiple gas-filled prominent loops of large and small bowel are seen throughout the visualized abdomen in keeping with un underlying ileus. No gross free intraperitoneal gas. Gas and stool is seen within the rectal vault. No organomegaly. No acute bone abnormality. IMPRESSION: Mild ileus. Electronically Signed   By: Fidela Salisbury MD   On: 11/15/2020 04:15   . amiodarone  200 mg Oral Daily  . calcitRIOL  0.5 mcg Oral Daily  . Chlorhexidine Gluconate Cloth  6 each Topical Q0600  . Chlorhexidine Gluconate Cloth  6 each Topical Q0600  . colestipol  4 g Oral Daily  . darbepoetin (ARANESP) injection - DIALYSIS  100 mcg Intravenous Q Wed-HD  . famotidine  20 mg Oral Daily  . feeding supplement  237 mL Oral BID BM  . fludrocortisone  0.1 mg Oral Daily  . loperamide  2 mg Oral TID  . metoprolol tartrate  12.5 mg Oral BID  . midodrine  10 mg Oral TID with  meals  . multivitamin  1 tablet Oral QHS  . octreotide  50 mcg Subcutaneous Q12H  . predniSONE  10 mg Oral Q breakfast  . simvastatin  20 mg Oral QPM  . sodium chloride flush  3 mL Intravenous Q12H    BMET    Component Value Date/Time   NA 131 (L) 11/16/2020 0528   K 3.5 11/16/2020 0528   CL 97 (L) 11/16/2020 0528   CO2 24 11/16/2020 0528   GLUCOSE 137 (H) 11/16/2020 0528   BUN 27 (H) 11/16/2020 0528   CREATININE 2.07 (H) 11/16/2020 0528   CALCIUM 7.4 (L) 11/16/2020 0528   GFRNONAA 34 (L) 11/16/2020 0528   GFRAA 19 (L) 06/15/2018 0407   CBC    Component Value Date/Time   WBC 5.3 11/16/2020 0528   RBC 2.89 (L) 11/16/2020 0528   HGB 8.5 (L) 11/16/2020 0528   HCT 27.2 (L) 11/16/2020 0528   PLT 171 11/16/2020 0528   MCV 94.1 11/16/2020  0528   MCH 29.4 11/16/2020 0528   MCHC 31.3 11/16/2020 0528   RDW 20.0 (H) 11/16/2020 0528   LYMPHSABS 0.5 (L) 11/02/2020 1209   MONOABS 1.2 (H) 11/02/2020 1209   EOSABS 0.0 11/02/2020 1209   BASOSABS 0.1 11/02/2020 1209    Assessment/Plan:  1. RecurrentSyncope/hypotension; sig orthostasis:On TID Midodrine and florinef. HD with minimal UFdue to good UOP. 1. Please make sure he is wearing an abdominal binder and compression stockings 2. Need to keep head of bed raised 30-45 degrees 3. He also needs PT assistance with orthostatic vital signs. 4. Diarrhea better 5. Keep even with HD. 6. Please follow orthostatics (none documented) and ambulate with PT as tolerated. 7. Recommend looking into MRI brain, rule out deposition disease? 2. Colitis- Ranee Gosselin been following and he completed a course ofcipro/flagyl. He had colonoscopy on 10/17/20 which was a poor prep and incomplete exam but did noteblood in the rectum and sigmoid colon, in the descending colon and at the splenic flexure; colitis involving descending and sigmoid colons seen. Biopsies performed. Per GI and surgery. His diarrhea persists and now has a flexseal. Currently on prednisone taper,creon, colestid, and imodium. 5-HIAA leves pending 3. Urinary retention- this is new. Pt with significant UOP. Improved with foley catheter and may need outpatient Urology evaluation. Unclear if this is drug related or due to BPH 4. [facility].  Change in in-house HD schedule given high patient census: Plan for HD tomorrow on Saturday.  Will get back on Schedule Monday if he is still here. 1. 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 1. No amyloid deposits reports, I do not have access to his full biopsy  report 5. No evidence of immune complex mediated or active GN was seenAnemia:of CKD- s/p blood transfusion. No heparin with HD. GI on board, colonoscopyfindings as above. Rec 1u prbc 11/22 1. Transfuse as needed to keep Hgb >7 2. On Aranesp weekly. 6. 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 Resumecalcitriol 0.3mcg daily (was on this as an outpatient). Calcium correctsto8.7. 7. Severe protein malnutrition:per primary, push protein 8. H/o Hypertension:see #1 9. Vascular access- will need to f/u with Dr. Donnetta Hutching once stable for discharge. 10. A fib- on amiodarone and eliquis (off A/C as of right now) 11. Hyponatremia. Monitor, 137Na bath 12. Disposition- pending ability to safely ambulate before discharge.  Gean Quint, MD [facility]

## 2020-11-16 NOTE — Procedures (Signed)
   HEMODIALYSIS TREATMENT NOTE:  Activase instilled x 60 minutes for presumed fibrin sheath.  Uneventful 3.5 hour low-heparin HD completed without problems.  Kept even / no UF as ordered.  Hemodynamically stable throughout.  All blood was returned.  Orthostatic hypotension earlier today despite abdominal binder + compression hose per report.    Rockwell Alexandria, RN

## 2020-11-16 NOTE — Progress Notes (Signed)
Physical Therapy Treatment Patient Details Name: [name] MRN: [id number] DOB: [birth year] Today's Date: 11/16/2020    History of Present Illness [name] is a 69 y.o. male with medical history significant for type 2 diabetes, CKD stage V recently initiated on hemodialysis MWF, atrial fibrillation/flutter on amiodarone and metoprolol and Eliquis, recurrent orthostatic hypotension, dyslipidemia, and recent discharge on 12/2 for colitis with associated adynamic ileus and acute blood loss anemia who presented back to the ED today after he was noted to have an episode of syncope while getting up to go to hemodialysis from his SNF.  On arrival to the ED his systolic blood pressures were in the 70 mmHg range.  He is noted to have some diarrhea, but he has recently completed course of ciprofloxacin and Flagyl by 11/23.  He was noted to have nonspecific colitis on pathology after recent colonoscopy on 11/17 and was started on prednisone taper which he continues to take at this time.    PT Comments    Orthostatics were monitored during today's session: Supine 152/126, seated 96/68, and standing 82/55. Patient independent with rolling today but requires min A to transition to seated EOB. He demonstrates good sitting tolerance and sitting balance EOB today although he is symptomatic with c/o dizziness with sitting which subsides after about 3-4 minutes. He requires mod assist and RW to transfer to standing secondary to bilateral LE weakness.  Patient demonstrates impaired standing tolerance as he becomes symptomatic with hypotension with standing and is limited to several slow, lateral steps at EOB and requests to return to supine. Patient assisted back into bed and HOB elevated. Patient will benefit from continued physical therapy in hospital and recommended venue below to increase strength, balance, endurance for safe ADLs and gait.   Follow Up Recommendations  SNF     Equipment Recommendations   Rolling walker with 5" wheels    Recommendations for Other Services       Precautions / Restrictions Precautions Precautions: Fall Precaution Comments: monitor BP Restrictions Weight Bearing Restrictions: No    Mobility  Bed Mobility Overal bed mobility: Needs Assistance Bed Mobility: Supine to Sit;Sit to Supine Rolling: Independent   Supine to sit: Min assist Sit to supine: Min guard   General bed mobility comments: increased time, labored movement  Transfers Overall transfer level: Needs assistance Equipment used: Rolling walker (2 wheeled) Transfers: Sit to/from Stand Sit to Stand: Mod assist;From elevated surface         General transfer comment: requires mod assist and RW to transfer to standing  Ambulation/Gait Ambulation/Gait assistance: Mod assist Gait Distance (Feet): 2 Feet Assistive device: Rolling walker (2 wheeled)   Gait velocity: slow   General Gait Details: several small, shuffled, lateral steps at bedside   Stairs             Wheelchair Mobility    Modified Rankin (Stroke Patients Only)       Balance Overall balance assessment: Needs assistance Sitting-balance support: Feet supported;No upper extremity supported Sitting balance-Leahy Scale: Fair Sitting balance - Comments: fair/good seated at bedside   Standing balance support: During functional activity;Bilateral upper extremity supported Standing balance-Leahy Scale: Poor Standing balance comment: fair/poor using RW                            Cognition Arousal/Alertness: Awake/alert Behavior During Therapy: WFL for tasks assessed/performed Overall Cognitive Status: Within Functional Limits for tasks assessed  Exercises      General Comments        Pertinent Vitals/Pain Pain Assessment: No/denies pain    Home Living                      Prior Function            PT Goals (current  goals can now be found in the care plan section) Acute Rehab PT Goals Patient Stated Goal: return home after rehab PT Goal Formulation: With patient Time For Goal Achievement: 11/29/20 Potential to Achieve Goals: Good Progress towards PT goals: Progressing toward goals    Frequency    Min 3X/week      PT Plan Current plan remains appropriate    Co-evaluation              AM-PAC PT "6 Clicks" Mobility   Outcome Measure  Help needed turning from your back to your side while in a flat bed without using bedrails?: None Help needed moving from lying on your back to sitting on the side of a flat bed without using bedrails?: A Little Help needed moving to and from a bed to a chair (including a wheelchair)?: A Lot Help needed standing up from a chair using your arms (e.g., wheelchair or bedside chair)?: A Lot Help needed to walk in hospital room?: A Lot Help needed climbing 3-5 steps with a railing? : Total 6 Click Score: 14    End of Session Equipment Utilized During Treatment: Gait belt Activity Tolerance: Patient tolerated treatment well;Patient limited by fatigue Patient left: in bed;with call bell/phone within reach Nurse Communication: Mobility status PT Visit Diagnosis: Unsteadiness on feet (R26.81);Other abnormalities of gait and mobility (R26.89);Muscle weakness (generalized) (M62.81)     Time: 7290-2111 PT Time Calculation (min) (ACUTE ONLY): 29 min  Charges:  $Therapeutic Activity: 23-37 mins                     9:45 AM, 11/16/20 Mearl Latin PT, DPT Physical Therapist at [facility]

## 2020-11-16 NOTE — Progress Notes (Signed)
Subjective: Doing ok this morning. Has been cleaned up twice since yesterday, mostly gas and some soft stool ("like applesauce"). Feels diarrhea is somewhat better today. No abdominal pain, N/V. Is happy rectal tube has been removed. No other GI complaints.  Objective: Vital signs in last 24 hours: Temp:  [98 F (36.7 C)-99.1 F (37.3 C)] 98.2 F (36.8 C) (12/17 0518) Pulse Rate:  [69-87] 87 (12/17 0518) Resp:  [16-20] 20 (12/17 0518) BP: (127-158)/(71-106) 138/106 (12/17 0518) SpO2:  [97 %-98 %] 98 % (12/17 0518) Weight:  [66.8 kg-72.4 kg] 66.8 kg (12/17 0500) Last BM Date: 11/15/20 General:   Alert and oriented, pleasant Head:  Normocephalic and atraumatic. Eyes:  No icterus, sclera clear. Conjuctiva pink.  Neck:  Supple, without thyromegaly or masses.  Heart:  S1, S2 present, no murmurs noted.  Lungs: Clear to auscultation bilaterally, without wheezing, rales, or rhonchi.  Abdomen:  Bowel sounds present, soft, non-tender, non-distended. No HSM or hernias noted. No rebound or guarding. Msk:  Symmetrical without gross deformities. Normal posture. Pulses:  Normal bilateral DP pulses noted. Extremities:  Without clubbing or edema. Neurologic:  Alert and  oriented x4;  grossly normal neurologically. Skin: Noted port in place right upper chest. Psych:  Alert and cooperative. Normal mood and affect.  Intake/Output from previous day: 12/16 0701 - 12/17 0700 In: 720 [P.O.:720] Out: 2000 [Urine:1800; Stool:200] Intake/Output this shift: No intake/output data recorded.  Lab Results: Recent Labs    11/14/20 0544 11/15/20 0627 11/16/20 0528  WBC 5.8 5.1 5.3  HGB 7.9* 8.3* 8.5*  HCT 24.5* 27.0* 27.2*  PLT 183 195 171   BMET Recent Labs    11/14/20 0544 11/15/20 0627 11/16/20 0528  NA 131* 127* 131*  K 3.3* 3.9 3.5  CL 98 97* 97*  CO2 24 21* 24  GLUCOSE 137* 159* 137*  BUN 40* 47* 27*  CREATININE 3.12* 2.99* 2.07*  CALCIUM 7.2* 7.3* 7.4*   LFT Recent Labs     11/14/20 0544  ALBUMIN 1.9*   PT/INR No results for input(s): LABPROT, INR in the last 72 hours. Hepatitis Panel No results for input(s): HEPBSAG, HCVAB, HEPAIGM, HEPBIGM in the last 72 hours.   Studies/Results: DG Abd Portable 1V  Result Date: 11/15/2020 CLINICAL DATA:  Abdominal distension EXAM: PORTABLE ABDOMEN - 1 VIEW COMPARISON:  10/25/2020 FINDINGS: The subdiaphragmatic region and right flank are excluded from view. Multiple gas-filled prominent loops of large and small bowel are seen throughout the visualized abdomen in keeping with un underlying ileus. No gross free intraperitoneal gas. Gas and stool is seen within the rectal vault. No organomegaly. No acute bone abnormality. IMPRESSION: Mild ileus. Electronically Signed   By: Fidela Salisbury MD   On: 11/15/2020 04:15    Assessment: 69 year old male well-known to [place] from recent several week admission for diarrhea and rectal bleeding, found to have CT findings (without contrast) of colitis, developing rectal bleeding during hospitalization in the setting of Eliquis, receiving multiple units of packed red blood cells, undergoing colonoscopy during past recent admission with poor prep and incomplete exam. Blood noted in the rectum, sigmoid, descending colon, splenic flexure.Colonoscopy findings suspicious for segmental or ischemic colitis. Pathology without changes or features of IBD, query drug-induced or ischemic. CMV stains negative.He was started empirically on steroids with mild improvement in diarrhea. Prior hospitalization received empiric course of Cipro and Flagyl. Prior hospitalization negative Cdiff and GI pathogen panel.Patient presented back to the hospital 12/3 due to an episode of syncope while getting  up to go to dialysis from SNF  Diarrhea: Extensive evaluation with negative celiac serologies, negative C. difficile, negative GI pathogen panel, negative O+P. Stool osmolality 627, stool sodium 43, stool potassium 76,  osmotic gap 52. Serotonin level <5. 5HIAA still pending. CTA abdomen pelvis with no evidence of mesenteric ischemia and noted to have continued gaseous distention of the sigmoid colon and transverse colon. Mild wall thickening in the sigmoid colon felt to be nonspecific, findings suggestive of colonic ileus but overall wall thickening improved from prior CTs back in November. Abd film yesterday with mild ileus. Bowel regimen includes Imodium 2 mg 3 times daily, prednisone taper, currently 10 mg daily. Creon recently discontinued. Increased Colestid to 4 g daily yesterday. Stool output appears to be decreasing, stool remains liquid no no significnt improvement in general. He had rectal tube bag changed at 5pm yesterday and no stool in the bag, there is liquid stool in the tube from the rectum to the bag. He was started on renal dose octreotide (50 mcg subq every 12 hours) for empiric trial. Overall he feels diarrhea is improving today.  Patient has limited mobility given ongoing rectal tube placement. He denies any ongoing lightheadedness or orthostasis in several days. Orthostatics pending for today. Prior to admission in November he was fully functional and able to complete all ADLs. Rectal tube was discontinued yesterday.  Anemia: Hgb stable for several days, no obvious bleeding. Monitor for any recurrence.   Plan: 1. Continue Imodium, prednisone taper, Colestid, octreotide trial 2. Follow for improvement in diarrhea with addition of octreotide 3. Strict I/Os 4. Monitor for 5HIAA results 5. Monitor for recurrent orthostasis now that more ambulatory 6. Consider early interval colonoscopy if no improvement 7. May eventually need tertiary care referral if continues without improvement 8. Supportive measures   Thank you for allowing [place] to participate in the care of [facility]nutt  Walden Field, DNP, AGNP-C Adult & Gerontological Nurse Practitioner [facility]    LOS:  14 days    11/16/2020, 8:44 AM

## 2020-11-16 NOTE — Care Management Important Message (Signed)
Important Message  Patient Details  Name: [name] MRN: [id number] Date of Birth: 28-Aug-1951   Medicare Important Message Given:  Yes     [name] 11/16/2020, 4:00 PM

## 2020-11-17 LAB — CBC
HCT: 26.6 % — ABNORMAL LOW (ref 39.0–52.0)
Hemoglobin: 8.2 g/dL — ABNORMAL LOW (ref 13.0–17.0)
MCH: 29.4 pg (ref 26.0–34.0)
MCHC: 30.8 g/dL (ref 30.0–36.0)
MCV: 95.3 fL (ref 80.0–100.0)
Platelets: 207 10*3/uL (ref 150–400)
RBC: 2.79 MIL/uL — ABNORMAL LOW (ref 4.22–5.81)
RDW: 19.5 % — ABNORMAL HIGH (ref 11.5–15.5)
WBC: 7.6 10*3/uL (ref 4.0–10.5)
nRBC: 0 % (ref 0.0–0.2)

## 2020-11-17 LAB — RENAL FUNCTION PANEL
Albumin: 1.9 g/dL — ABNORMAL LOW (ref 3.5–5.0)
Anion gap: 10 (ref 5–15)
BUN: 51 mg/dL — ABNORMAL HIGH (ref 8–23)
CO2: 21 mmol/L — ABNORMAL LOW (ref 22–32)
Calcium: 7.3 mg/dL — ABNORMAL LOW (ref 8.9–10.3)
Chloride: 99 mmol/L (ref 98–111)
Creatinine, Ser: 3.21 mg/dL — ABNORMAL HIGH (ref 0.61–1.24)
GFR, Estimated: 20 mL/min — ABNORMAL LOW (ref >60)
Glucose, Bld: 176 mg/dL — ABNORMAL HIGH (ref 70–99)
Phosphorus: 3.8 mg/dL (ref 2.5–4.6)
Potassium: 3.4 mmol/L — ABNORMAL LOW (ref 3.5–5.1)
Sodium: 130 mmol/L — ABNORMAL LOW (ref 135–145)

## 2020-11-17 LAB — 5 HIAA, QUANTITATIVE, URINE, 24 HOUR
5-HIAA, Ur: 3.2 mg/L
5-HIAA,Quant.,24 Hr Urine: 6.7 mg/24 hr (ref 0.0–14.9)
Total Volume: 2100

## 2020-11-17 MED ORDER — BISMUTH SUBSALICYLATE 262 MG PO CHEW
524.0000 mg | CHEWABLE_TABLET | Freq: Four times a day (QID) | ORAL | Status: DC
  Filled 2020-11-17 (×9): qty 2

## 2020-11-17 MED ORDER — BISMUTH SUBSALICYLATE 262 MG/15ML PO SUSP
30.0000 mL | Freq: Four times a day (QID) | ORAL | Status: AC
  Administered 2020-11-17 – 2020-11-19 (×6): 30 mL via ORAL
  Filled 2020-11-17 (×2): qty 118

## 2020-11-17 NOTE — Progress Notes (Signed)
Subjective: Patient states he is feeling about the same.  Does not feel his diarrhea is improved at all since starting the octreotide.  His 5 HIAA 24-hour urine has returned and was normal.  No abdominal pain.  Endorses good appetite.  No melena hematochezia.  Objective: Vital signs in last 24 hours: Temp:  [97.7 F (36.5 C)-98.4 F (36.9 C)] 98.2 F (36.8 C) (12/18 0352) Pulse Rate:  [70-73] 73 (12/18 0844) Resp:  [18] 18 (12/18 0352) BP: (126-155)/(70-85) 155/81 (12/18 0844) SpO2:  [98 %-99 %] 98 % (12/18 0352) Weight:  [67.6 kg] 67.6 kg (12/18 0432) Last BM Date: 11/16/20 General:   Alert and oriented, pleasant Head:  Normocephalic and atraumatic. Eyes:  No icterus, sclera clear. Conjuctiva pink.  Mouth:  Without lesions, mucosa pink and moist.  Neck:  Supple, without thyromegaly or masses.  Heart:  S1, S2 present, no murmurs noted.  Lungs: Clear to auscultation bilaterally, without wheezing, rales, or rhonchi.  Abdomen:  Bowel sounds present, soft, non-tender, non-distended. No HSM or hernias noted. No rebound or guarding. No masses appreciated  Msk:  Symmetrical without gross deformities. Normal posture. Pulses:  Normal pulses noted. Extremities:  Without clubbing or edema. Neurologic:  Alert and  oriented x4;  grossly normal neurologically. Skin:  Warm and dry, intact without significant lesions.  Cervical Nodes:  No significant cervical adenopathy. Psych:  Alert and cooperative. Normal mood and affect.  Intake/Output from previous day: 12/17 0701 - 12/18 0700 In: 720 [P.O.:720] Out: 1700 [Urine:1700] Intake/Output this shift: Total I/O In: 480 [P.O.:480] Out: 500 [Urine:500]  Lab Results: Recent Labs    11/15/20 0627 11/16/20 0528  WBC 5.1 5.3  HGB 8.3* 8.5*  HCT 27.0* 27.2*  PLT 195 171   BMET Recent Labs    11/15/20 0627 11/16/20 0528  NA 127* 131*  K 3.9 3.5  CL 97* 97*  CO2 21* 24  GLUCOSE 159* 137*  BUN 47* 27*  CREATININE 2.99* 2.07*  CALCIUM  7.3* 7.4*   LFT No results for input(s): PROT, ALBUMIN, AST, ALT, ALKPHOS, BILITOT, BILIDIR, IBILI in the last 72 hours. PT/INR No results for input(s): LABPROT, INR in the last 72 hours. Hepatitis Panel No results for input(s): HEPBSAG, HCVAB, HEPAIGM, HEPBIGM in the last 72 hours.   Studies/Results: MR BRAIN WO CONTRAST  Result Date: 11/16/2020 CLINICAL DATA:  Question deposition disease. Hypotension. Altered mental status. EXAM: MRI HEAD WITHOUT CONTRAST TECHNIQUE: Multiplanar, multiecho pulse sequences of the brain and surrounding structures were obtained without intravenous contrast. COMPARISON:  Head CT 01/25/2020 FINDINGS: Brain: Diffusion imaging does not show any acute or subacute infarction. No focal abnormality affects brainstem or cerebellum. Cerebral hemispheres show mild age related volume loss without evidence of small-vessel disease or large vessel infarction. No mass lesion, hemorrhage, hydrocephalus or extra-axial collection. Vascular: Major vessels at the base of the brain show flow. Skull and upper cervical spine: Negative Sinuses/Orbits: Clear/normal Other: None IMPRESSION: No acute or reversible finding. Mild age related volume loss. No evidence of small-vessel disease or large vessel infarction. Electronically Signed   By: Nelson Chimes M.D.   On: 11/16/2020 14:01    Assessment: *Diarrhea *Left-sided colitis  Plan: Patient remains a challenge from a GI standpoint.  Etiology of his diarrhea remains elusive.  He does have left-sided colitis seen on colonoscopy suspicious for segmental versus ischemic.  Pathology without changes of IBD.  CMV stains negative.  GI panel, celiac serologies, C. difficile, ova and parasites all negative.  24-hour 5 HIAA  WNL.  CTA abdomen pelvis with no evidence of ischemia with mild thickening in the sigmoid colon.  He has completed a course of antibiotics with Cipro and Flagyl without improvement.  He is empirically on a steroid taper.  We have  tried cholestyramine.  I am going to discontinue his octreotide given normal 5 HIAA levels.  I am going to add Pepto-Bismol 4 times daily.  Continue Imodium.  We may need to consider repeat colonoscopy in the near future.  GI to continue to follow.  Elon Alas. Abbey Chatters, D.O. Gastroenterology and Hepatology [facility]   LOS: 15 days    11/17/2020, 11:22 AM

## 2020-11-17 NOTE — Progress Notes (Signed)
PROGRESS NOTE    [name]  TOI:[id number] DOB: 05-14-1951 DOA: 11/02/2020 PCP: Patient, No Pcp Per   Brief Narrative:  Per HPI: [name] a 69 y.o.malewith medical history significant fortype 2 diabetes, CKD stage V recently initiated on hemodialysis MWF, atrial fibrillation/flutter on amiodarone and metoprolol and Eliquis, recurrent orthostatic hypotension, dyslipidemia, and recent discharge on 12/2 for colitis with associated adynamic ileus and acute blood loss anemia who presented back to the ED today after he was noted to have an episode of syncope while getting up to go to hemodialysis from his SNF. On arrival to the ED his systolic blood pressures were in the 70 mmHg range. He is noted to have some diarrhea, but he has recently completed course of ciprofloxacin and Flagyl by 11/23. He was noted to have nonspecific colitis on pathology after recent colonoscopy on 11/17 and was started on prednisone taper which he continues to take at this time.  -Patient continues to remain orthostatic and has ongoing liquid stool output related to colitis versus other etiology that is being investigated by GI.  He remains very challenging from a diagnostic standpoint and continues to remain quite symptomatic and cannot ambulate or perform any other activities.  Previously was quite independent and functional.  Assessment & Plan:   Active Problems:   Syncope due to orthostatic hypotension   ESRD on hemodialysis (HCC)   Syncope secondary to recurrent orthostatic hypotension -Continue midodrine as previous - added holding parameters -continue Florinef -Lower extremity stockingswith abdominal binder -Plan for hemodialysis per nephrologynext session 12/18 -Repeat orthostatics performed significant ongoing orthostasis with standing even with abdominal binder and compression stockings present.  Plan to recheck in a.m. -Negative fluid balance noted -Brain MRI on 12/17 with no acute findings  of deposition disease noted  Lactic acidosis - RESOLVED -Likely due toearliersoft blood pressure readings with no signs of other acute infection noted  Hyponatremia -Adjustment of hemodialysis per nephrology  Ongoing colitis with diarrhea - stool seems to be slowing down some -Continue prednisone taper by 10 mg/week per GI  -Recent treatment with ciprofloxacin and Flagyl for 10 days completed -Continue on Imodium 3 times daily as well as Colestid 4 g daily as adjusted per GI -Started on octreotide 12/16 and rectal tube discontinued -5-HIAA results pending -Repeat Cdiff negative -Rectal tube DC'd -Ongoing GI evaluation appreciated  ESRD on HD MWF -Appreciate nephrology evaluation for hemodialysis while inpatient,havinghemodialysis with minimal fluid removal  Acute urinary retention on 12/13 Bladder scan with >999 mL seen -I/O cath prn if not voiding - May need cath  - Urology follow up needed -Question if this is drug related or due to BPH -Avoid Flomax as this would worsen orthostatics and hypotension  Chronic anemia-stable -Received 5 units of PRBCs during his last admission -Continue to hold apixaban per prior admission recommendations -No overt bleeding noted -Hemoglobin remains stable -CBC being monitored  Paroxysmal atrial fibrillation/flutter -Continue close monitoring on telemetry -Resumed home amiodarone and metoprolol -Hold apixaban per cardiology recommendations on prior admission  Dyslipidemia -Continue Zocor  Hypoalbuminemia -Likely mild to moderate protein calorie malnutrition -Dietitian consultation appreciated  DVT prophylaxis:SCDs Code Status:Full Family Communication:Pt prefers to call himself; none at bedside Disposition Plan: SNF when medically stabilized and GI sign off; certain studies still pending Status is: Inpatient  Remains inpatient appropriate because:IV treatments appropriate due to intensity of illness or inability  to take PO and Inpatient level of care appropriate due to severity of illness  Dispo: The patient is from:SNF  Anticipated d/c is to:SNF Anticipated d/c date is:>3days Patient currently is not medically stable to d/c.Will plan to discharge once stool output has improved and patient less orthostatic. GI work-up ongoing. Ongoing hemodialysis treatments per nephrology. Pt developed acute urinary retention and has Foley catheter now.  Will need to follow-up with urology once discharged.  Consultants:  Nephrology  GI  Procedures:  See below  Antimicrobials:   None  Subjective: Patient seen and evaluated today with no new acute complaints or concerns. No acute concerns or events noted overnight.  He continues to have liquid stool output.  Objective: Vitals:   11/16/20 2055 11/17/20 0352 11/17/20 0432 11/17/20 0844  BP: 139/81 (!) 147/85  (!) 155/81  Pulse: 70 72  73  Resp: 18 18    Temp: 97.7 F (36.5 C) 98.2 F (36.8 C)    TempSrc:      SpO2: 99% 98%    Weight:   67.6 kg   Height:        Intake/Output Summary (Last 24 hours) at 11/17/2020 1056 Last data filed at 11/17/2020 0900 Gross per 24 hour  Intake 960 ml  Output 2200 ml  Net -1240 ml   Filed Weights   11/15/20 2015 11/16/20 0500 11/17/20 0432  Weight: 72.4 kg 66.8 kg 67.6 kg    Examination:  General exam: Appears calm and comfortable  Respiratory system: Clear to auscultation. Respiratory effort normal. Cardiovascular system: S1 & S2 heard, RRR.  Gastrointestinal system: Abdomen is soft Central nervous system: Alert and awake Extremities: No edema Skin: No significant lesions noted Psychiatry: Flat affect.    Data Reviewed: I have personally reviewed following labs and imaging studies  CBC: Recent Labs  Lab 11/14/20 0544 11/15/20 0627 11/16/20 0528  WBC 5.8 5.1 5.3  HGB 7.9* 8.3* 8.5*  HCT 24.5* 27.0* 27.2*  MCV 93.2 94.4 94.1  PLT 183  195 220   Basic Metabolic Panel: Recent Labs  Lab 11/12/20 0606 11/14/20 0544 11/15/20 0627 11/16/20 0528  NA 130* 131* 127* 131*  K 4.0 3.3* 3.9 3.5  CL 98 98 97* 97*  CO2 20* 24 21* 24  GLUCOSE 105* 137* 159* 137*  BUN 43* 40* 47* 27*  CREATININE 3.09* 3.12* 2.99* 2.07*  CALCIUM 7.6* 7.2* 7.3* 7.4*  MG  --   --  1.5* 1.7  PHOS 3.7 2.9  --   --    GFR: Estimated Creatinine Clearance: 31.7 mL/min (A) (by C-G formula based on SCr of 2.07 mg/dL (H)). Liver Function Tests: Recent Labs  Lab 11/12/20 0606 11/14/20 0544  ALBUMIN 2.3* 1.9*   No results for input(s): LIPASE, AMYLASE in the last 168 hours. No results for input(s): AMMONIA in the last 168 hours. Coagulation Profile: No results for input(s): INR, PROTIME in the last 168 hours. Cardiac Enzymes: No results for input(s): CKTOTAL, CKMB, CKMBINDEX, TROPONINI in the last 168 hours. BNP (last 3 results) No results for input(s): PROBNP in the last 8760 hours. HbA1C: No results for input(s): HGBA1C in the last 72 hours. CBG: No results for input(s): GLUCAP in the last 168 hours. Lipid Profile: No results for input(s): CHOL, HDL, LDLCALC, TRIG, CHOLHDL, LDLDIRECT in the last 72 hours. Thyroid Function Tests: No results for input(s): TSH, T4TOTAL, FREET4, T3FREE, THYROIDAB in the last 72 hours. Anemia Panel: No results for input(s): VITAMINB12, FOLATE, FERRITIN, TIBC, IRON, RETICCTPCT in the last 72 hours. Sepsis Labs: No results for input(s): PROCALCITON, LATICACIDVEN in the last 168 hours.  Recent Results (from the  past 240 hour(s))  SARS Coronavirus 2 by RT PCR (hospital order, performed in [facility] hospital lab) Nasopharyngeal Nasopharyngeal Swab     Status: None   Collection Time: 11/11/20 10:49 AM   Specimen: Nasopharyngeal Swab  Result Value Ref Range Status   SARS Coronavirus 2 NEGATIVE NEGATIVE Final    Comment: Performed at [facility], [address], [place], [place] [address]         Radiology  Studies: MR BRAIN WO CONTRAST  Result Date: 11/16/2020 CLINICAL DATA:  Question deposition disease. Hypotension. Altered mental status. EXAM: MRI HEAD WITHOUT CONTRAST TECHNIQUE: Multiplanar, multiecho pulse sequences of the brain and surrounding structures were obtained without intravenous contrast. COMPARISON:  Head CT 01/25/2020 FINDINGS: Brain: Diffusion imaging does not show any acute or subacute infarction. No focal abnormality affects brainstem or cerebellum. Cerebral hemispheres show mild age related volume loss without evidence of small-vessel disease or large vessel infarction. No mass lesion, hemorrhage, hydrocephalus or extra-axial collection. Vascular: Major vessels at the base of the brain show flow. Skull and upper cervical spine: Negative Sinuses/Orbits: Clear/normal Other: None IMPRESSION: No acute or reversible finding. Mild age related volume loss. No evidence of small-vessel disease or large vessel infarction. Electronically Signed   By: Nelson Chimes M.D.   On: 11/16/2020 14:01        Scheduled Meds: . amiodarone  200 mg Oral Daily  . calcitRIOL  0.5 mcg Oral Daily  . Chlorhexidine Gluconate Cloth  6 each Topical Q0600  . Chlorhexidine Gluconate Cloth  6 each Topical Q0600  . colestipol  4 g Oral Daily  . darbepoetin (ARANESP) injection - DIALYSIS  100 mcg Intravenous Q Wed-HD  . famotidine  20 mg Oral Daily  . feeding supplement  237 mL Oral BID BM  . fludrocortisone  0.1 mg Oral Daily  . loperamide  2 mg Oral TID  . metoprolol tartrate  12.5 mg Oral BID  . midodrine  10 mg Oral TID with meals  . multivitamin  1 tablet Oral QHS  . octreotide  50 mcg Subcutaneous Q12H  . simvastatin  20 mg Oral QPM  . sodium chloride flush  3 mL Intravenous Q12H   Continuous Infusions: . sodium chloride    . sodium chloride    . sodium chloride       LOS: 15 days    Time spent: 35 minutes    Anieya Helman Darleen Crocker, DO [facility]  If 7PM-7AM, please contact  night-coverage [email or web address] 11/17/2020, 10:56 AM

## 2020-11-17 NOTE — Procedures (Signed)
   HEMODIALYSIS TREATMENT NOTE:  3.5 hour heparin-free HD completed via RIJ TDC. Kept even / no fluid removed.  Sluggish cath flow again; max Qb 300.  Will instill Activase prior to next session.  All blood was returned.  No changes from pre-HD assessment.  Rockwell Alexandria, RN

## 2020-11-18 DIAGNOSIS — R55 Syncope and collapse: Secondary | ICD-10-CM

## 2020-11-18 LAB — CBC
HCT: 29.2 % — ABNORMAL LOW (ref 39.0–52.0)
Hemoglobin: 8.9 g/dL — ABNORMAL LOW (ref 13.0–17.0)
MCH: 28.5 pg (ref 26.0–34.0)
MCHC: 30.5 g/dL (ref 30.0–36.0)
MCV: 93.6 fL (ref 80.0–100.0)
Platelets: 209 10*3/uL (ref 150–400)
RBC: 3.12 MIL/uL — ABNORMAL LOW (ref 4.22–5.81)
RDW: 19.5 % — ABNORMAL HIGH (ref 11.5–15.5)
WBC: 8.8 10*3/uL (ref 4.0–10.5)
nRBC: 0 % (ref 0.0–0.2)

## 2020-11-18 LAB — BASIC METABOLIC PANEL
Anion gap: 11 (ref 5–15)
BUN: 33 mg/dL — ABNORMAL HIGH (ref 8–23)
CO2: 23 mmol/L (ref 22–32)
Calcium: 7.2 mg/dL — ABNORMAL LOW (ref 8.9–10.3)
Chloride: 97 mmol/L — ABNORMAL LOW (ref 98–111)
Creatinine, Ser: 2.08 mg/dL — ABNORMAL HIGH (ref 0.61–1.24)
GFR, Estimated: 34 mL/min — ABNORMAL LOW (ref >60)
Glucose, Bld: 122 mg/dL — ABNORMAL HIGH (ref 70–99)
Potassium: 3.1 mmol/L — ABNORMAL LOW (ref 3.5–5.1)
Sodium: 131 mmol/L — ABNORMAL LOW (ref 135–145)

## 2020-11-18 LAB — MAGNESIUM: Magnesium: 1.5 mg/dL — ABNORMAL LOW (ref 1.7–2.4)

## 2020-11-18 MED ORDER — POTASSIUM CHLORIDE CRYS ER 20 MEQ PO TBCR
40.0000 meq | EXTENDED_RELEASE_TABLET | Freq: Once | ORAL | Status: AC
  Administered 2020-11-18: 40 meq via ORAL
  Filled 2020-11-18: qty 2

## 2020-11-18 MED ORDER — MAGNESIUM SULFATE 2 GM/50ML IV SOLN
2.0000 g | Freq: Once | INTRAVENOUS | Status: AC
  Administered 2020-11-18: 2 g via INTRAVENOUS
  Filled 2020-11-18: qty 50

## 2020-11-18 NOTE — Progress Notes (Signed)
Subjective: Patient notes mild improvement since taking the Pepto though he only admits to taking 2 doses.  No abdominal pain.  Continues to endorse a good appetite.  No melena hematochezia.  Objective: Vital signs in last 24 hours: Temp:  [98.2 F (36.8 C)-98.6 F (37 C)] 98.6 F (37 C) (12/19 1410) Pulse Rate:  [68-89] 74 (12/19 1410) Resp:  [16-20] 20 (12/19 1410) BP: (95-160)/(54-90) 110/65 (12/19 1410) SpO2:  [97 %-98 %] 98 % (12/19 1410) Weight:  [67.8 kg-68.1 kg] 68.1 kg (12/19 0500) Last BM Date: 11/18/20 General:   Alert and oriented, pleasant Head:  Normocephalic and atraumatic. Eyes:  No icterus, sclera clear. Conjuctiva pink.  Mouth:  Without lesions, mucosa pink and moist.  Neck:  Supple, without thyromegaly or masses.  Heart:  S1, S2 present, no murmurs noted.  Lungs: Clear to auscultation bilaterally, without wheezing, rales, or rhonchi.  Abdomen:  Bowel sounds present, soft, non-tender, non-distended. No HSM or hernias noted. No rebound or guarding. No masses appreciated  Msk:  Symmetrical without gross deformities. Normal posture. Pulses:  Normal pulses noted. Extremities:  Without clubbing or edema. Neurologic:  Alert and  oriented x4;  grossly normal neurologically. Skin:  Warm and dry, intact without significant lesions.  Cervical Nodes:  No significant cervical adenopathy. Psych:  Alert and cooperative. Normal mood and affect.  Intake/Output from previous day: 12/18 0701 - 12/19 0700 In: 720 [P.O.:720] Out: 1950 [Urine:1950] Intake/Output this shift: Total I/O In: 289.5 [P.O.:240; I.V.:3; IV Piggyback:46.5] Out: -   Lab Results: Recent Labs    11/16/20 0528 11/17/20 1844 11/18/20 0458  WBC 5.3 7.6 8.8  HGB 8.5* 8.2* 8.9*  HCT 27.2* 26.6* 29.2*  PLT 171 207 209   BMET Recent Labs    11/16/20 0528 11/17/20 1843 11/18/20 0458  NA 131* 130* 131*  K 3.5 3.4* 3.1*  CL 97* 99 97*  CO2 24 21* 23  GLUCOSE 137* 176* 122*  BUN 27* 51* 33*   CREATININE 2.07* 3.21* 2.08*  CALCIUM 7.4* 7.3* 7.2*   LFT Recent Labs    11/17/20 1843  ALBUMIN 1.9*   PT/INR No results for input(s): LABPROT, INR in the last 72 hours. Hepatitis Panel No results for input(s): HEPBSAG, HCVAB, HEPAIGM, HEPBIGM in the last 72 hours.   Studies/Results: No results found.   Assessment: *Diarrhea *Left-sided colitis  Plan: Patient remains a challenge from a GI standpoint.  Etiology of his diarrhea remains elusive.  He does have left-sided colitis seen on colonoscopy suspicious for segmental versus ischemic.  Pathology without changes of IBD.  CMV stains negative.  GI panel, celiac serologies, C. difficile, ova and parasites all negative.  24-hour 5 HIAA WNL.  CTA abdomen pelvis with no evidence of ischemia with mild thickening in the sigmoid colon.  He has completed a course of antibiotics with Cipro and Flagyl without improvement.  He is empirically on a steroid taper.  We have tried cholestyramine. Octreotide discontinued given normal 5 HIAA levels.  Pepto-Bismol appears to help some though patient only admits to taking 1-1/2 doses.  Recommend that he take a dose every 6 hours and we will evaluate in the a.m. for improvement. Continue Imodium.  We may need to consider repeat colonoscopy in the near future though patient is not very eager to complete another colon preparation.  GI to continue to follow.  Elon Alas. Abbey Chatters, D.O. Gastroenterology and Hepatology [facility]   LOS: 16 days    11/18/2020, 3:36 PM

## 2020-11-18 NOTE — Progress Notes (Addendum)
PROGRESS NOTE        PATIENT DETAILS Name: [name] Age: 69 y.o. Sex: male Date of Birth: [birth year] Admit Date: 11/02/2020 Admitting Physician Pratik Darleen Crocker, DO PIR:[id number], No Pcp Per  Brief Narrative: Patient is a 69 y.o. male ESRD on HD MWF, DM-2, atrial fibrillation, HLD-recent hospitalization for colitis/diarrhea-presented to the hospital for evaluation of syncope due to orthostatic hypotension.  Hospital course complicated by ongoing diarrhea.  Significant events: 12/3>> admit for syncope due to recurrent orthostatic hypotension, ongoing colitis due to diarrhea 11/12-12/2>> hospitalization-syncope/orthostatic hypotension-colitis-rectal bleeding-acute blood loss anemia  Significant studies: 12/8>> CTA abdomen/pelvis: Gaseous distention of the sigmoid/transverse colon, bilateral kidneys are atrophic.  Cholelithiasis.  No significant vascular abnormalities. 12/13>> urine 5-HIAA: Normal limits  Antimicrobial therapy: None  Microbiology data: 12/3>> C. difficile PCR negative. 12/6>> stool ova/parasites: Negative 12/7>> GI pathogen panel: Negative   Pathology: 11/17>> colon biopsy: Severely active chronic nonspecific colitis with ulceration-negative for granulomas or dysplasia.  Stains for CMV negative.  Procedures : 11/17>> colonoscopy:Colitis involving descending and sigmoid colon.  Consults: GI, nephrology, cardiology  DVT Prophylaxis : SCDs Start: 11/02/20 1520   Subjective: Continues to have diarrhea.  No other complaints.  Assessment/Plan: Syncope: Due to recurrent orthostatic hypotension-continue midodrine/Florinef/lower extremity stockings with abdominal binder.  Diarrhea: GI following-has completed a course of Cipro/Flagyl-on prednisone taper.  Extensive work-up as above-GI following and directing care.  Suspect needs repeat colonoscopy and repeat biopsies.  ESRD: On HD MWF-nephrology following and directing care.  PAF:  Continue amiodarone/metoprolol-Eliquis on hold per cardiology recommendations during recent hospitalization due to rectal bleeding and acute blood loss anemia.  Anticoagulation to be addressed upon follow-up with cardiology in the outpatient setting  HLD: Continue statin  Anemia: Multifactorial-likely due to acute illness/ESRD-recent acute blood loss anemia due to rectal bleeding.  No rectal bleeding apparent this hospitalization.  Hemoglobin relatively stable.  Required approximately 5 units of PRBC transfusion during  most recent hospitalization.  Hypokalemia/hypomagnesemia: Replete and recheck.  Acute urinary retention-s/p Foley placement on 12/16: Continue to follow closely-avoiding Flomax due to orthostatic hypotension-May need to be started on finasteride.  Voiding trial when closer to discharge.   Diet: Diet Order            Diet renal with fluid restriction Fluid restriction: 1200 mL Fluid; Room service appropriate? Yes; Fluid consistency: Thin  Diet effective now                  Code Status: Full cod  Family Communication: Patient prefers to call family himself.  Disposition Plan: Status is: Inpatient  Remains inpatient appropriate because:Inpatient level of care appropriate due to severity of illness   Dispo: The patient is from: SNF              Anticipated d/c is to: SNF              Anticipated d/c date is: > 3 days              Patient currently is not medically stable to d/c.   Barriers to Discharge: Ongoing diarrhea and orthostatic hypotension  Antimicrobial agents: Anti-infectives (From admission, onward)   None       Time spent: 25-minutes-Greater than 50% of this time was spent in counseling, explanation of diagnosis, planning of further management, and coordination of care.  MEDICATIONS: Scheduled Meds: . amiodarone  200 mg Oral Daily  . bismuth subsalicylate  30 mL Oral Q6H  . calcitRIOL  0.5 mcg Oral Daily  . Chlorhexidine Gluconate Cloth   6 each Topical Q0600  . Chlorhexidine Gluconate Cloth  6 each Topical Q0600  . colestipol  4 g Oral Daily  . darbepoetin (ARANESP) injection - DIALYSIS  100 mcg Intravenous Q Wed-HD  . famotidine  20 mg Oral Daily  . feeding supplement  237 mL Oral BID BM  . fludrocortisone  0.1 mg Oral Daily  . loperamide  2 mg Oral TID  . metoprolol tartrate  12.5 mg Oral BID  . midodrine  10 mg Oral TID with meals  . multivitamin  1 tablet Oral QHS  . simvastatin  20 mg Oral QPM  . sodium chloride flush  3 mL Intravenous Q12H   Continuous Infusions: . sodium chloride    . sodium chloride    . sodium chloride     PRN Meds:.sodium chloride, sodium chloride, sodium chloride, acetaminophen **OR** acetaminophen, heparin, heparin, lidocaine (PF), lidocaine-prilocaine, ondansetron **OR** ondansetron (ZOFRAN) IV, pentafluoroprop-tetrafluoroeth, sodium chloride flush   PHYSICAL EXAM: Vital signs: Vitals:   11/18/20 0500 11/18/20 0700 11/18/20 0909 11/18/20 1020  BP:  121/75 95/61 113/66  Pulse:  89 84 77  Resp:  18    Temp:      TempSrc:      SpO2:      Weight: 68.1 kg     Height:       Filed Weights   11/17/20 0432 11/17/20 1720 11/18/20 0500  Weight: 67.6 kg 67.8 kg 68.1 kg   Body mass index is 19.28 kg/m.   Gen Exam:Alert awake-not in any distress HEENT:atraumatic, normocephalic Chest: B/L clear to auscultation anteriorly CVS:S1S2 regular Abdomen:soft non tender, non distended Extremities:no edema Neurology: Non focal Skin: no rash  I have personally reviewed following labs and imaging studies  LABORATORY DATA: CBC: Recent Labs  Lab 11/14/20 0544 11/15/20 0627 11/16/20 0528 11/17/20 1844 11/18/20 0458  WBC 5.8 5.1 5.3 7.6 8.8  HGB 7.9* 8.3* 8.5* 8.2* 8.9*  HCT 24.5* 27.0* 27.2* 26.6* 29.2*  MCV 93.2 94.4 94.1 95.3 93.6  PLT 183 195 171 207 191    Basic Metabolic Panel: Recent Labs  Lab 11/12/20 0606 11/14/20 0544 11/15/20 0627 11/16/20 0528 11/17/20 1843  11/18/20 0458  NA 130* 131* 127* 131* 130* 131*  K 4.0 3.3* 3.9 3.5 3.4* 3.1*  CL 98 98 97* 97* 99 97*  CO2 20* 24 21* 24 21* 23  GLUCOSE 105* 137* 159* 137* 176* 122*  BUN 43* 40* 47* 27* 51* 33*  CREATININE 3.09* 3.12* 2.99* 2.07* 3.21* 2.08*  CALCIUM 7.6* 7.2* 7.3* 7.4* 7.3* 7.2*  MG  --   --  1.5* 1.7  --  1.5*  PHOS 3.7 2.9  --   --  3.8  --     GFR: Estimated Creatinine Clearance: 31.8 mL/min (A) (by C-G formula based on SCr of 2.08 mg/dL (H)).  Liver Function Tests: Recent Labs  Lab 11/12/20 0606 11/14/20 0544 11/17/20 1843  ALBUMIN 2.3* 1.9* 1.9*   No results for input(s): LIPASE, AMYLASE in the last 168 hours. No results for input(s): AMMONIA in the last 168 hours.  Coagulation Profile: No results for input(s): INR, PROTIME in the last 168 hours.  Cardiac Enzymes: No results for input(s): CKTOTAL, CKMB, CKMBINDEX, TROPONINI in the last 168 hours.  BNP (last 3 results) No results for input(s): PROBNP in the last 8760 hours.  Lipid Profile: No results for input(s): CHOL, HDL, LDLCALC, TRIG, CHOLHDL, LDLDIRECT in the last 72 hours.  Thyroid Function Tests: No results for input(s): TSH, T4TOTAL, FREET4, T3FREE, THYROIDAB in the last 72 hours.  Anemia Panel: No results for input(s): VITAMINB12, FOLATE, FERRITIN, TIBC, IRON, RETICCTPCT in the last 72 hours.  Urine analysis:    Component Value Date/Time   COLORURINE YELLOW 10/12/2020 1622   APPEARANCEUR CLEAR 10/12/2020 1622   LABSPEC 1.008 10/12/2020 1622   PHURINE 8.0 10/12/2020 1622   GLUCOSEU >=500 (A) 10/12/2020 1622   HGBUR NEGATIVE 10/12/2020 1622   BILIRUBINUR NEGATIVE 10/12/2020 1622   KETONESUR NEGATIVE 10/12/2020 1622   PROTEINUR 100 (A) 10/12/2020 1622   NITRITE NEGATIVE 10/12/2020 1622   LEUKOCYTESUR NEGATIVE 10/12/2020 1622    Sepsis Labs: Lactic Acid, Venous    Component Value Date/Time   LATICACIDVEN 2.6 (HH) 11/02/2020 1357    MICROBIOLOGY: Recent Results (from the past 240  hour(s))  SARS Coronavirus 2 by RT PCR (hospital order, performed in [address] hospital lab) Nasopharyngeal Nasopharyngeal Swab     Status: None   Collection Time: 11/11/20 10:49 AM   Specimen: Nasopharyngeal Swab  Result Value Ref Range Status   SARS Coronavirus 2 NEGATIVE NEGATIVE Final    Comment: Performed at [facility], [address] [place], [place] [address]    RADIOLOGY STUDIES/RESULTS: MR BRAIN WO CONTRAST  Result Date: 11/16/2020 CLINICAL DATA:  Question deposition disease. Hypotension. Altered mental status. EXAM: MRI HEAD WITHOUT CONTRAST TECHNIQUE: Multiplanar, multiecho pulse sequences of the brain and surrounding structures were obtained without intravenous contrast. COMPARISON:  Head CT 01/25/2020 FINDINGS: Brain: Diffusion imaging does not show any acute or subacute infarction. No focal abnormality affects brainstem or cerebellum. Cerebral hemispheres show mild age related volume loss without evidence of small-vessel disease or large vessel infarction. No mass lesion, hemorrhage, hydrocephalus or extra-axial collection. Vascular: Major vessels at the base of the brain show flow. Skull and upper cervical spine: Negative Sinuses/Orbits: Clear/normal Other: None IMPRESSION: No acute or reversible finding. Mild age related volume loss. No evidence of small-vessel disease or large vessel infarction. Electronically Signed   By: Nelson Chimes M.D.   On: 11/16/2020 14:01     LOS: 16 days   Oren Binet, MD  [facility]    To contact the attending provider between 7A-7P or the covering provider during after hours 7P-7A, please log into the web site [email or web address] and access using universal [place] password for that web site. If you do not have the password, please call the hospital operator.  11/18/2020, 10:25 AM

## 2020-11-19 LAB — RENAL FUNCTION PANEL
Albumin: 1.8 g/dL — ABNORMAL LOW (ref 3.5–5.0)
Anion gap: 9 (ref 5–15)
BUN: 41 mg/dL — ABNORMAL HIGH (ref 8–23)
CO2: 22 mmol/L (ref 22–32)
Calcium: 7.2 mg/dL — ABNORMAL LOW (ref 8.9–10.3)
Chloride: 97 mmol/L — ABNORMAL LOW (ref 98–111)
Creatinine, Ser: 2.79 mg/dL — ABNORMAL HIGH (ref 0.61–1.24)
GFR, Estimated: 24 mL/min — ABNORMAL LOW (ref >60)
Glucose, Bld: 94 mg/dL (ref 70–99)
Phosphorus: 3.5 mg/dL (ref 2.5–4.6)
Potassium: 3.2 mmol/L — ABNORMAL LOW (ref 3.5–5.1)
Sodium: 128 mmol/L — ABNORMAL LOW (ref 135–145)

## 2020-11-19 LAB — CBC
HCT: 24.9 % — ABNORMAL LOW (ref 39.0–52.0)
Hemoglobin: 7.7 g/dL — ABNORMAL LOW (ref 13.0–17.0)
MCH: 28.9 pg (ref 26.0–34.0)
MCHC: 30.9 g/dL (ref 30.0–36.0)
MCV: 93.6 fL (ref 80.0–100.0)
Platelets: 177 10*3/uL (ref 150–400)
RBC: 2.66 MIL/uL — ABNORMAL LOW (ref 4.22–5.81)
RDW: 19.5 % — ABNORMAL HIGH (ref 11.5–15.5)
WBC: 7.3 10*3/uL (ref 4.0–10.5)
nRBC: 0 % (ref 0.0–0.2)

## 2020-11-19 LAB — MAGNESIUM: Magnesium: 1.8 mg/dL (ref 1.7–2.4)

## 2020-11-19 MED ORDER — MAGNESIUM SULFATE 2 GM/50ML IV SOLN
2.0000 g | Freq: Once | INTRAVENOUS | Status: AC
  Administered 2020-11-19: 2 g via INTRAVENOUS
  Filled 2020-11-19: qty 50

## 2020-11-19 MED ORDER — SODIUM CHLORIDE 0.9 % IV SOLN
125.0000 mg | Freq: Every day | INTRAVENOUS | Status: AC
  Administered 2020-11-19 – 2020-11-26 (×8): 125 mg via INTRAVENOUS
  Filled 2020-11-19 (×10): qty 10

## 2020-11-19 MED ORDER — FINASTERIDE 5 MG PO TABS
5.0000 mg | ORAL_TABLET | Freq: Every day | ORAL | Status: DC
  Administered 2020-11-19 – 2020-12-11 (×22): 5 mg via ORAL
  Filled 2020-11-19 (×23): qty 1

## 2020-11-19 MED ORDER — POTASSIUM CHLORIDE CRYS ER 20 MEQ PO TBCR
40.0000 meq | EXTENDED_RELEASE_TABLET | Freq: Once | ORAL | Status: AC
  Administered 2020-11-19: 40 meq via ORAL
  Filled 2020-11-19: qty 2

## 2020-11-19 MED ORDER — FLUDROCORTISONE ACETATE 0.1 MG PO TABS
0.2000 mg | ORAL_TABLET | Freq: Every day | ORAL | Status: DC
Start: 2020-11-19 — End: 2020-12-11
  Administered 2020-11-19 – 2020-12-11 (×22): 0.2 mg via ORAL
  Filled 2020-11-19 (×23): qty 2

## 2020-11-19 MED ORDER — DARBEPOETIN ALFA 200 MCG/0.4ML IJ SOSY
200.0000 ug | PREFILLED_SYRINGE | INTRAMUSCULAR | Status: DC
  Filled 2020-11-19: qty 0.4

## 2020-11-19 MED ORDER — POLYETHYLENE GLYCOL 3350 17 G PO PACK
17.0000 g | PACK | ORAL | Status: AC
Start: 2020-11-20 — End: 2020-11-20
  Administered 2020-11-20 (×6): 17 g via ORAL
  Filled 2020-11-19 (×4): qty 1

## 2020-11-19 NOTE — Plan of Care (Signed)

## 2020-11-19 NOTE — Progress Notes (Signed)
Subjective: Per documentation, 7 loose stools over past 24 hours. Patient feels that stool output frequency has improved, noting of those overnight, which is improved for him. Still watery. No rectal bleeding. Appetite is fair. Continues to deal with orthostatic hypotension.   Objective: Vital signs in last 24 hours: Temp:  [98.3 F (36.8 C)-98.6 F (37 C)] 98.4 F (36.9 C) (12/20 0523) Pulse Rate:  [73-80] 80 (12/20 0523) Resp:  [18-20] 20 (12/20 0523) BP: (97-133)/(54-69) 112/69 (12/20 0523) SpO2:  [98 %] 98 % (12/20 0523) Weight:  [68 kg] 68 kg (12/20 0523) Last BM Date: 11/18/20 General:   Alert and oriented, pleasant, chronically ill-appearing, pale Head:  Normocephalic and atraumatic. Abdomen:  Bowel sounds present, soft, non-tender, non-distended. No HSM or hernias noted. No rebound or guarding. No masses appreciated  Extremities:  Without edema. Neurologic:  Alert and  oriented x4 Psych:  Alert and cooperative. Normal mood and affect.  Intake/Output from previous day: 12/19 0701 - 12/20 0700 In: 529.5 [P.O.:480; I.V.:3; IV Piggyback:46.5] Out: 900 [Urine:900] Intake/Output this shift: Total I/O In: 240 [P.O.:240] Out: -   Lab Results: Recent Labs    11/17/20 1844 11/18/20 0458 11/19/20 0506  WBC 7.6 8.8 7.3  HGB 8.2* 8.9* 7.7*  HCT 26.6* 29.2* 24.9*  PLT 207 209 177   BMET Recent Labs    11/17/20 1843 11/18/20 0458 11/19/20 0506  NA 130* 131* 128*  K 3.4* 3.1* 3.2*  CL 99 97* 97*  CO2 21* 23 22  GLUCOSE 176* 122* 94  BUN 51* 33* 41*  CREATININE 3.21* 2.08* 2.79*  CALCIUM 7.3* 7.2* 7.2*   LFT Recent Labs    11/17/20 1843 11/19/20 0506  ALBUMIN 1.9* 1.8*    Assessment: 69 year old male well-known to [place] from recent several week admission for diarrhea and rectal bleeding, found to have CT findings (without contrast) of colitis, developing rectal bleeding during hospitalization in the setting of Eliquis, receiving multiple units of packed red  blood cells, undergoing colonoscopy during past recent admission with poor prep and incomplete exam. Blood noted in the rectum, sigmoid, descending colon, splenic flexure.Colonoscopy findings suspicious for segmental or ischemic colitis. Pathology without changes or features of IBD, query drug-induced or ischemic. CMV stains negative.He was started empirically on steroids with mild improvement in diarrhea. Prior hospitalization received empiric course of Cipro and Flagyl. Prior hospitalization negative Cdiff and GI pathogen panel.Patient presented back to the hospital 12/3 due to an episode of syncope while getting up to go to dialysis from SNF.  Thus far, he has undergone extensive evaluation including negative repeat Cdiff, GI pathogen, O&P, negative celiac serologies, stool osmolality with findings of secretory diarrhea. 24 hour urinary 5HIAA negative. CTA without evidence for chronic mesenteric ischemia. Bowel regimen attempted has included Creon (now discontinued), Colestid 4 grams daily,  Imodium TID, course of octreotide now discontinued,  prednisone course with taper now finished as of 12/18, and most recently pepto QID starting 12/18.  Today, he notes some improvement overnight with frequency. Continues to have loose stools, and there are actually 7 loose stools documented in past 24 hours.  Management of diarrhea remains a challenge. Very well may need repeat colonoscopy to evaluate colon completely as previous was poor prep and incomplete. Anticipate tertiary referral thereafter if no improvement.   Plan: Continue Pepto QID, Imodium, Colestid Will discuss timing of colonoscopy with attending Further recommendations to follow   Annitta Needs, PhD, ANP-BC [facility]     LOS: 17 days  11/19/2020, 10:00 AM

## 2020-11-19 NOTE — TOC Progression Note (Addendum)
Transition of Care [facility]) - Progression Note    Patient Details  Name: [name] MRN: [id number] Date of Birth: [birth year]  Transition of Care [facility]) CM/SW Contact  Boneta Lucks, RN Phone Number: 11/19/2020, 1:47 PM  Clinical Narrative:  Due to severity of illness , [facility] - [facility] called they may not be able to hold patient's bed. Follow up at discharge, anticipated dc 1 - 2 days.    Addendum; MD states patient is very ill, will need days. Updated [facility], they will not hold his private room and charge patient. When patient is stable check back with [facility] to see if they have a bed available.     Expected Discharge Plan: Skilled Nursing Facility Barriers to Discharge: Continued Medical Work up  Expected Discharge Plan and Services Expected Discharge Plan: [place]

## 2020-11-19 NOTE — Progress Notes (Signed)
PROGRESS NOTE        PATIENT DETAILS Name: [name] Age: 69 y.o. Sex: male Date of Birth: Jun 27, 1951 Admit Date: 11/02/2020 Admitting Physician Pratik Darleen Crocker, DO WLN:[id number], No Pcp Per  Brief Narrative: Patient is a 69 y.o. male ESRD on HD MWF, DM-2, atrial fibrillation, HLD-recent hospitalization for colitis/diarrhea-presented to the hospital for evaluation of syncope due to orthostatic hypotension.  Hospital course complicated by ongoing diarrhea and persistent orthostatic hypotension  Significant events: 11/12-12/2>> hospitalization-syncope/orthostatic hypotension-colitis-rectal bleeding-acute blood loss anemia-ultimately discharged to SNF. 12/3>> syncope while at SNF-readmitted-severe orthostatic hypotension-ongoing diarrhea due to colitis.    Significant studies: 12/8>> CTA abdomen/pelvis: Gaseous distention of the sigmoid/transverse colon, bilateral kidneys are atrophic.  Cholelithiasis.  No significant vascular abnormalities. 12/13>> urine 5-HIAA: Normal limits  Antimicrobial therapy: None  Microbiology data: 12/3>> C. difficile PCR negative. 12/6>> stool ova/parasites: Negative 12/7>> GI pathogen panel: Negative   Pathology: 11/17>> colon biopsy: Severely active chronic nonspecific colitis with ulceration-negative for granulomas or dysplasia.  Stains for CMV negative.  Procedures : 11/17>> colonoscopy:Colitis involving descending and sigmoid colon.  Consults: GI, nephrology, cardiology  DVT Prophylaxis : SCDs Start: 11/02/20 1520   Subjective: Thinks diarrhea is slowing down-still dizzy when he stands up but claims it is somewhat better than the day before.  Assessment/Plan: Syncope: Due to recurrent orthostatic hypotension-continue midodrine/Florinef/lower extremity stockings with abdominal binder.  Diarrhea: GI following-has completed a course of Cipro/Flagyl-on prednisone taper.  Extensive work-up as above-GI following and  directing care.  Suspect needs repeat colonoscopy and repeat biopsies at some point.  ESRD: On HD MWF-nephrology following and directing care.  Nephrology planning on holding hemodialysis and seeing how he does.  PAF: Continue amiodarone/metoprolol-Eliquis on hold per cardiology recommendations during recent hospitalization due to rectal bleeding and acute blood loss anemia.  Anticoagulation to be addressed upon follow-up with cardiology in the outpatient setting  HLD: Continue statin  Anemia: Multifactorial-likely due to acute illness/ESRD-recent acute blood loss anemia due to rectal bleeding.  No obvious blood loss-but significant drop in hemoglobin overnight-plan is to watch closely-and transfuse if hemoglobin less than 7.  Require approximately 5 units of PRBC during his most recent hospitalization.  n.  Hypokalemia/hypomagnesemia: Continue to replete and recheck  Hyponatremia: Due to impaired free water excretion in the setting of renal disease.  Currently asymptomatic-follow closely.  Acute urinary retention-s/p Foley placement on 12/16: Continue to follow closely-avoiding Flomax due to orthostatic hypotension-start finasteride-we will plan on voiding trial in the next few days.   Diet: Diet Order            Diet renal with fluid restriction Fluid restriction: 1200 mL Fluid; Room service appropriate? Yes; Fluid consistency: Thin  Diet effective now                  Code Status: Full cod  Family Communication: Patient prefers to call family himself.  Disposition Plan: Status is: Inpatient  Remains inpatient appropriate because:Inpatient level of care appropriate due to severity of illness   Dispo: The patient is from: SNF              Anticipated d/c is to: SNF              Anticipated d/c date is: > 3 days              Patient currently is  not medically stable to d/c.   Barriers to Discharge: Ongoing diarrhea and orthostatic hypotension  Antimicrobial  agents: Anti-infectives (From admission, onward)   None       Time spent: 25-minutes-Greater than 50% of this time was spent in counseling, explanation of diagnosis, planning of further management, and coordination of care.  MEDICATIONS: Scheduled Meds: . amiodarone  200 mg Oral Daily  . bismuth subsalicylate  30 mL Oral Q6H  . calcitRIOL  0.5 mcg Oral Daily  . Chlorhexidine Gluconate Cloth  6 each Topical Q0600  . Chlorhexidine Gluconate Cloth  6 each Topical Q0600  . colestipol  4 g Oral Daily  . [START ON 11/21/2020] darbepoetin (ARANESP) injection - DIALYSIS  200 mcg Intravenous Q Wed-HD  . famotidine  20 mg Oral Daily  . feeding supplement  237 mL Oral BID BM  . fludrocortisone  0.2 mg Oral Daily  . loperamide  2 mg Oral TID  . metoprolol tartrate  12.5 mg Oral BID  . midodrine  10 mg Oral TID with meals  . multivitamin  1 tablet Oral QHS  . simvastatin  20 mg Oral QPM  . sodium chloride flush  3 mL Intravenous Q12H   Continuous Infusions: . sodium chloride    . sodium chloride    . sodium chloride    . ferric gluconate (FERRLECIT/NULECIT) IV     PRN Meds:.sodium chloride, sodium chloride, sodium chloride, acetaminophen **OR** acetaminophen, heparin, heparin, lidocaine (PF), lidocaine-prilocaine, ondansetron **OR** ondansetron (ZOFRAN) IV, pentafluoroprop-tetrafluoroeth, sodium chloride flush   PHYSICAL EXAM: Vital signs: Vitals:   11/18/20 1300 11/18/20 1410 11/18/20 2103 11/19/20 0523  BP: (!) 97/54 110/65 133/69 112/69  Pulse: 76 74 73 80  Resp:  20 18 20   Temp:  98.6 F (37 C) 98.3 F (36.8 C) 98.4 F (36.9 C)  TempSrc:  Oral Oral Oral  SpO2:  98% 98% 98%  Weight:    68 kg  Height:       Filed Weights   11/17/20 1720 11/18/20 0500 11/19/20 0523  Weight: 67.8 kg 68.1 kg 68 kg   Body mass index is 19.25 kg/m.  Gen Exam:Alert awake-not in any distress HEENT:atraumatic, normocephalic Chest: B/L clear to auscultation anteriorly CVS:S1S2  regular Abdomen:soft non tender, non distended Extremities:no edema Neurology: Non focal Skin: no rash  I have personally reviewed following labs and imaging studies  LABORATORY DATA: CBC: Recent Labs  Lab 11/15/20 0627 11/16/20 0528 11/17/20 1844 11/18/20 0458 11/19/20 0506  WBC 5.1 5.3 7.6 8.8 7.3  HGB 8.3* 8.5* 8.2* 8.9* 7.7*  HCT 27.0* 27.2* 26.6* 29.2* 24.9*  MCV 94.4 94.1 95.3 93.6 93.6  PLT 195 171 207 209 448    Basic Metabolic Panel: Recent Labs  Lab 11/14/20 0544 11/15/20 0627 11/16/20 0528 11/17/20 1843 11/18/20 0458 11/19/20 0506  NA 131* 127* 131* 130* 131* 128*  K 3.3* 3.9 3.5 3.4* 3.1* 3.2*  CL 98 97* 97* 99 97* 97*  CO2 24 21* 24 21* 23 22  GLUCOSE 137* 159* 137* 176* 122* 94  BUN 40* 47* 27* 51* 33* 41*  CREATININE 3.12* 2.99* 2.07* 3.21* 2.08* 2.79*  CALCIUM 7.2* 7.3* 7.4* 7.3* 7.2* 7.2*  MG  --  1.5* 1.7  --  1.5* 1.8  PHOS 2.9  --   --  3.8  --  3.5    GFR: Estimated Creatinine Clearance: 23.7 mL/min (A) (by C-G formula based on SCr of 2.79 mg/dL (H)).  Liver Function Tests: Recent Labs  Lab 11/14/20  4193 11/17/20 1843 11/19/20 0506  ALBUMIN 1.9* 1.9* 1.8*   No results for input(s): LIPASE, AMYLASE in the last 168 hours. No results for input(s): AMMONIA in the last 168 hours.  Coagulation Profile: No results for input(s): INR, PROTIME in the last 168 hours.  Cardiac Enzymes: No results for input(s): CKTOTAL, CKMB, CKMBINDEX, TROPONINI in the last 168 hours.  BNP (last 3 results) No results for input(s): PROBNP in the last 8760 hours.  Lipid Profile: No results for input(s): CHOL, HDL, LDLCALC, TRIG, CHOLHDL, LDLDIRECT in the last 72 hours.  Thyroid Function Tests: No results for input(s): TSH, T4TOTAL, FREET4, T3FREE, THYROIDAB in the last 72 hours.  Anemia Panel: No results for input(s): VITAMINB12, FOLATE, FERRITIN, TIBC, IRON, RETICCTPCT in the last 72 hours.  Urine analysis:    Component Value Date/Time    COLORURINE YELLOW 10/12/2020 1622   APPEARANCEUR CLEAR 10/12/2020 1622   LABSPEC 1.008 10/12/2020 1622   PHURINE 8.0 10/12/2020 1622   GLUCOSEU >=500 (A) 10/12/2020 1622   HGBUR NEGATIVE 10/12/2020 1622   BILIRUBINUR NEGATIVE 10/12/2020 1622   KETONESUR NEGATIVE 10/12/2020 1622   PROTEINUR 100 (A) 10/12/2020 1622   NITRITE NEGATIVE 10/12/2020 1622   LEUKOCYTESUR NEGATIVE 10/12/2020 1622    Sepsis Labs: Lactic Acid, Venous    Component Value Date/Time   LATICACIDVEN 2.6 (HH) 11/02/2020 1357    MICROBIOLOGY: Recent Results (from the past 240 hour(s))  SARS Coronavirus 2 by RT PCR (hospital order, performed in [place] hospital lab) Nasopharyngeal Nasopharyngeal Swab     Status: None   Collection Time: 11/11/20 10:49 AM   Specimen: Nasopharyngeal Swab  Result Value Ref Range Status   SARS Coronavirus 2 NEGATIVE NEGATIVE Final    Comment: Performed at [facility], [address], [place], [place] [address]    RADIOLOGY STUDIES/RESULTS: No results found.   LOS: 17 days   Oren Binet, MD  [facility]    To contact the attending provider between 7A-7P or the covering provider during after hours 7P-7A, please log into the web site [email or web address] and access using universal [place] password for that web site. If you do not have the password, please call the hospital operator.  11/19/2020, 10:24 AM

## 2020-11-19 NOTE — Progress Notes (Signed)
Patient ID: [name], male   DOB: 10/29/1951, 69 y.o.   MRN: [id number]   S: still has orthostatic hypotension with PT especially with abd binder and compression stockings. Last HD was Saturday-  hgb dropped this AM from 8.9 to 7.7   O:BP 112/69 (BP Location: Left Arm)   Pulse 80   Temp 98.4 F (36.9 C) (Oral)   Resp 20   Ht 6\' 2"  (1.88 m)   Wt 68 kg   SpO2 98%   BMI 19.25 kg/m   Intake/Output Summary (Last 24 hours) at 11/19/2020 0810 Last data filed at 11/19/2020 0522 Gross per 24 hour  Intake 529.47 ml  Output 900 ml  Net -370.53 ml   Intake/Output: I/O last 3 completed shifts: In: 769.5 [P.O.:720; I.V.:3; IV Piggyback:46.5] Out: 1650 [Urine:1650]  Intake/Output this shift:  No intake/output data recorded. Weight change: 0.2 kg Gen: NAD CVS: RRR Resp: cta Abd: +BS, soft, NT/ND abdominal binder in place Ext: no edema, TED stockings in place Access: rij tdc c/d/i  Recent Labs  Lab 11/14/20 0544 11/15/20 0627 11/16/20 0528 11/17/20 1843 11/18/20 0458 11/19/20 0506  NA 131* 127* 131* 130* 131* 128*  K 3.3* 3.9 3.5 3.4* 3.1* 3.2*  CL 98 97* 97* 99 97* 97*  CO2 24 21* 24 21* 23 22  GLUCOSE 137* 159* 137* 176* 122* 94  BUN 40* 47* 27* 51* 33* 41*  CREATININE 3.12* 2.99* 2.07* 3.21* 2.08* 2.79*  ALBUMIN 1.9*  --   --  1.9*  --  1.8*  CALCIUM 7.2* 7.3* 7.4* 7.3* 7.2* 7.2*  PHOS 2.9  --   --  3.8  --  3.5   Liver Function Tests: Recent Labs  Lab 11/14/20 0544 11/17/20 1843 11/19/20 0506  ALBUMIN 1.9* 1.9* 1.8*   No results for input(s): LIPASE, AMYLASE in the last 168 hours. No results for input(s): AMMONIA in the last 168 hours. CBC: Recent Labs  Lab 11/15/20 0627 11/16/20 0528 11/17/20 1844 11/18/20 0458 11/19/20 0506  WBC 5.1 5.3 7.6 8.8 7.3  HGB 8.3* 8.5* 8.2* 8.9* 7.7*  HCT 27.0* 27.2* 26.6* 29.2* 24.9*  MCV 94.4 94.1 95.3 93.6 93.6  PLT 195 171 207 209 177   Cardiac Enzymes: No results for input(s): CKTOTAL, CKMB, CKMBINDEX, TROPONINI  in the last 168 hours. CBG: No results for input(s): GLUCAP in the last 168 hours.  Iron Studies: No results for input(s): IRON, TIBC, TRANSFERRIN, FERRITIN in the last 72 hours. Studies/Results: No results found. Marland Kitchen amiodarone  200 mg Oral Daily  . bismuth subsalicylate  30 mL Oral Q6H  . calcitRIOL  0.5 mcg Oral Daily  . Chlorhexidine Gluconate Cloth  6 each Topical Q0600  . Chlorhexidine Gluconate Cloth  6 each Topical Q0600  . colestipol  4 g Oral Daily  . darbepoetin (ARANESP) injection - DIALYSIS  100 mcg Intravenous Q Wed-HD  . famotidine  20 mg Oral Daily  . feeding supplement  237 mL Oral BID BM  . fludrocortisone  0.1 mg Oral Daily  . loperamide  2 mg Oral TID  . metoprolol tartrate  12.5 mg Oral BID  . midodrine  10 mg Oral TID with meals  . multivitamin  1 tablet Oral QHS  . simvastatin  20 mg Oral QPM  . sodium chloride flush  3 mL Intravenous Q12H    BMET    Component Value Date/Time   NA 128 (L) 11/19/2020 0506   K 3.2 (L) 11/19/2020 0506   CL 97 (  L) 11/19/2020 0506   CO2 22 11/19/2020 0506   GLUCOSE 94 11/19/2020 0506   BUN 41 (H) 11/19/2020 0506   CREATININE 2.79 (H) 11/19/2020 0506   CALCIUM 7.2 (L) 11/19/2020 0506   GFRNONAA 24 (L) 11/19/2020 0506   GFRAA 19 (L) 06/15/2018 0407   CBC    Component Value Date/Time   WBC 7.3 11/19/2020 0506   RBC 2.66 (L) 11/19/2020 0506   HGB 7.7 (L) 11/19/2020 0506   HCT 24.9 (L) 11/19/2020 0506   PLT 177 11/19/2020 0506   MCV 93.6 11/19/2020 0506   MCH 28.9 11/19/2020 0506   MCHC 30.9 11/19/2020 0506   RDW 19.5 (H) 11/19/2020 0506   LYMPHSABS 0.5 (L) 11/02/2020 1209   MONOABS 1.2 (H) 11/02/2020 1209   EOSABS 0.0 11/02/2020 1209   BASOSABS 0.1 11/02/2020 1209    Assessment/Plan:  1. RecurrentSyncope/hypotension; sig orthostasis:On TID Midodrine and florinef. HD with minimal/no UFdue to good UOP. 1. Keep even with HD. 2. Please follow orthostatics (none documented) and ambulate with PT as  tolerated. 3. Recommend looking into MRI brain, rule out deposition disease? 4. Will increase florinef to 0.2 daily  2. Colitis- Ranee Gosselin been following and he completed a course ofcipro/flagyl. He had colonoscopy on 10/17/20 -did noteblood in the rectum and sigmoid colon, in the descending colon and at the splenic flexure; colitis involving descending and sigmoid colons seen. Biopsies performed. Per GI and surgery. His diarrhea persists and now has a flexseal. Currently on prednisone taper,creon, colestid, and imodium. 5-HIAA leves pending 3. Urinary retention- this is new. Pt with significant UOP. Improved with foley catheter and may need outpatient Urology evaluation. Unclear if this is drug related or due to BPH 4. ESRDrecent start [facility] M[facility].  Change in in-house HD schedule given high patient census: Biopsy results from outpatient nephrologist reviewed ([facility]): moderate to severe arterial nephrosclerosis with 75% global glomerulosclerosis, focal and segmental glomerular atrophic scarring and diffuse moderate to severe tubulointerstitial scarring. 1. No amyloid deposits reports, I do not have access to his full biopsy report 2. Makes good urine- I do not see a crt level higher than 3.2-  Will take this opportunity to hold HD and watch ? 5. Anemia:of CKD, possibly some GIB- s/p blood transfusion. No heparin with HD. GI on board, colonoscopyfindings as above. Rec 1u prbc 11/22 1. Transfuse as needed to keep Hgb >7 2. On Aranesp weekly- will inc dose- iron stores low on 12/10- will replete 6. CKD-MBD: Resumecalcitriol 0.68mcg daily (was on this as an outpatient). Calcium correctsto8.7- phos low, no binder. 7. Severe protein malnutrition:per primary, push protein  8. Vascular access- will need to f/u with Dr. Donnetta Hutching once stable for discharge. 9. A fib- on amiodarone and eliquis (off A/C as of right now) 10. Hyponatremia. Monitor, 137Na bath 11. Disposition- pending  ability to safely ambulate before discharge.  Louis Meckel  [name]

## 2020-11-20 DIAGNOSIS — K922 Gastrointestinal hemorrhage, unspecified: Secondary | ICD-10-CM

## 2020-11-20 DIAGNOSIS — K529 Noninfective gastroenteritis and colitis, unspecified: Secondary | ICD-10-CM

## 2020-11-20 LAB — RENAL FUNCTION PANEL
Albumin: 1.8 g/dL — ABNORMAL LOW (ref 3.5–5.0)
Anion gap: 9 (ref 5–15)
BUN: 45 mg/dL — ABNORMAL HIGH (ref 8–23)
CO2: 20 mmol/L — ABNORMAL LOW (ref 22–32)
Calcium: 7.2 mg/dL — ABNORMAL LOW (ref 8.9–10.3)
Chloride: 99 mmol/L (ref 98–111)
Creatinine, Ser: 3.15 mg/dL — ABNORMAL HIGH (ref 0.61–1.24)
GFR, Estimated: 20 mL/min — ABNORMAL LOW (ref >60)
Glucose, Bld: 105 mg/dL — ABNORMAL HIGH (ref 70–99)
Phosphorus: 3.9 mg/dL (ref 2.5–4.6)
Potassium: 3.3 mmol/L — ABNORMAL LOW (ref 3.5–5.1)
Sodium: 128 mmol/L — ABNORMAL LOW (ref 135–145)

## 2020-11-20 LAB — CBC
HCT: 24.4 % — ABNORMAL LOW (ref 39.0–52.0)
Hemoglobin: 7.5 g/dL — ABNORMAL LOW (ref 13.0–17.0)
MCH: 29.2 pg (ref 26.0–34.0)
MCHC: 30.7 g/dL (ref 30.0–36.0)
MCV: 94.9 fL (ref 80.0–100.0)
Platelets: 192 10*3/uL (ref 150–400)
RBC: 2.57 MIL/uL — ABNORMAL LOW (ref 4.22–5.81)
RDW: 19.9 % — ABNORMAL HIGH (ref 11.5–15.5)
WBC: 7.3 10*3/uL (ref 4.0–10.5)
nRBC: 0 % (ref 0.0–0.2)

## 2020-11-20 LAB — MAGNESIUM: Magnesium: 1.9 mg/dL (ref 1.7–2.4)

## 2020-11-20 MED ORDER — MAGNESIUM CITRATE PO SOLN
0.5000 | Freq: Once | ORAL | Status: AC
  Administered 2020-11-20: 0.5 via ORAL
  Filled 2020-11-20: qty 296

## 2020-11-20 MED ORDER — POLYETHYLENE GLYCOL 3350 17 G PO PACK
17.0000 g | PACK | ORAL | Status: DC
  Administered 2020-11-20 (×4): 17 g via ORAL
  Filled 2020-11-20 (×3): qty 1

## 2020-11-20 MED ORDER — POTASSIUM CHLORIDE CRYS ER 20 MEQ PO TBCR
40.0000 meq | EXTENDED_RELEASE_TABLET | Freq: Four times a day (QID) | ORAL | Status: AC
  Administered 2020-11-20 (×2): 40 meq via ORAL
  Filled 2020-11-20 (×2): qty 2

## 2020-11-20 MED ORDER — SIMETHICONE 80 MG PO CHEW
80.0000 mg | CHEWABLE_TABLET | Freq: Once | ORAL | Status: AC
  Administered 2020-11-20: 80 mg via ORAL
  Filled 2020-11-20: qty 1

## 2020-11-20 MED ORDER — MAGNESIUM SULFATE 2 GM/50ML IV SOLN
2.0000 g | Freq: Once | INTRAVENOUS | Status: AC
  Administered 2020-11-20: 2 g via INTRAVENOUS
  Filled 2020-11-20: qty 50

## 2020-11-20 NOTE — Progress Notes (Signed)
Physical Therapy Treatment Patient Details Name: [name] MRN: [id number] DOB: 1951/11/13 Today's Date: 11/20/2020    History of Present Illness [name] is a 69 y.o. male with medical history significant for type 2 diabetes, CKD stage V recently initiated on hemodialysis MWF, atrial fibrillation/flutter on amiodarone and metoprolol and Eliquis, recurrent orthostatic hypotension, dyslipidemia, and recent discharge on 12/2 for colitis with associated adynamic ileus and acute blood loss anemia who presented back to the ED today after he was noted to have an episode of syncope while getting up to go to hemodialysis from his SNF.  On arrival to the ED his systolic blood pressures were in the 70 mmHg range.  He is noted to have some diarrhea, but he has recently completed course of ciprofloxacin and Flagyl by 11/23.  He was noted to have nonspecific colitis on pathology after recent colonoscopy on 11/17 and was started on prednisone taper which he continues to take at this time.    PT Comments    Patient taking prep for possible colonoscopy tomorrow, having severe diarrhea and declines functional activity, but agreeable to exercises in bed.  Patient demonstrates fair/good return for completing BLE ROM/strengthening exercises while supine in bed bed, easily fatigues and had no c/o pain throughout visit.  Patient will benefit from continued physical therapy in hospital and recommended venue below to increase strength, balance, endurance for safe ADLs and gait.   Follow Up Recommendations  SNF     Equipment Recommendations  Rolling walker with 5" wheels    Recommendations for Other Services       Precautions / Restrictions Precautions Precautions: Fall Precaution Comments: monitor BP Restrictions Weight Bearing Restrictions: No    Mobility  Bed Mobility                  Transfers                    Ambulation/Gait                 Stairs              Wheelchair Mobility    Modified Rankin (Stroke Patients Only)       Balance                                            Cognition Arousal/Alertness: Awake/alert Behavior During Therapy: WFL for tasks assessed/performed Overall Cognitive Status: Within Functional Limits for tasks assessed                                        Exercises General Exercises - Lower Extremity Ankle Circles/Pumps: Supine;20 reps;Both;Strengthening;AROM Heel Slides: Supine;15 reps;Both;Strengthening;AROM Hip ABduction/ADduction: Supine;10 reps;Both;Strengthening;AROM Straight Leg Raises: Supine;10 reps;Both;Strengthening;AROM    General Comments        Pertinent Vitals/Pain Pain Assessment: No/denies pain    Home Living                      Prior Function            PT Goals (current goals can now be found in the care plan section) Acute Rehab PT Goals Patient Stated Goal: return home after rehab PT Goal Formulation: With patient Time For Goal Achievement: 11/29/20 Potential to Achieve  Goals: Good Progress towards PT goals: Progressing toward goals    Frequency    Min 3X/week      PT Plan Current plan remains appropriate    Co-evaluation              AM-PAC PT "6 Clicks" Mobility   Outcome Measure  Help needed turning from your back to your side while in a flat bed without using bedrails?: None Help needed moving from lying on your back to sitting on the side of a flat bed without using bedrails?: A Little Help needed moving to and from a bed to a chair (including a wheelchair)?: A Lot Help needed standing up from a chair using your arms (e.g., wheelchair or bedside chair)?: A Lot Help needed to walk in hospital room?: A Lot Help needed climbing 3-5 steps with a railing? : Total 6 Click Score: 14    End of Session   Activity Tolerance: Patient tolerated treatment well;Patient limited by fatigue Patient left: in  bed;with call bell/phone within reach Nurse Communication: Mobility status PT Visit Diagnosis: Unsteadiness on feet (R26.81);Other abnormalities of gait and mobility (R26.89);Muscle weakness (generalized) (M62.81)     Time: 1898-4210 PT Time Calculation (min) (ACUTE ONLY): 15 min  Charges:  $Therapeutic Exercise: 8-22 mins                     11:58 AM, 11/20/20 Lonell Grandchild, MPT Physical Therapist with [facility] 336 [phone] office [phone] mobile phone

## 2020-11-20 NOTE — Progress Notes (Signed)
Patient ID: [name], male   DOB: 1951/03/10, 69 y.o.   MRN: [id number]   S: still has orthostatic hypotension with PT especially with abd binder and compression stockings. Last HD was Saturday-  hgb dropped this AM from 8.9 to 7.7   O:BP 117/62 (BP Location: Right Arm)   Pulse 75   Temp 99.2 F (37.3 C) (Oral)   Resp 18   Ht 6\' 2"  (1.88 m)   Wt 67.8 kg   SpO2 98%   BMI 19.19 kg/m   Intake/Output Summary (Last 24 hours) at 11/20/2020 0821 Last data filed at 11/20/2020 0400 Gross per 24 hour  Intake 590.62 ml  Output 1750 ml  Net -1159.38 ml   Intake/Output: I/O last 3 completed shifts: In: 830.6 [P.O.:720; IV Piggyback:110.6] Out: 2650 [Urine:2650]  Intake/Output this shift:  No intake/output data recorded. Weight change: -0.2 kg Gen: NAD CVS: RRR Resp: cta Abd: +BS, soft, NT/ND abdominal binder in place Ext: no edema, TED stockings in place Access: rij tdc c/d/i  Recent Labs  Lab 11/14/20 0544 11/15/20 0627 11/16/20 0528 11/17/20 1843 11/18/20 0458 11/19/20 0506 11/20/20 0539  NA 131* 127* 131* 130* 131* 128* 128*  K 3.3* 3.9 3.5 3.4* 3.1* 3.2* 3.3*  CL 98 97* 97* 99 97* 97* 99  CO2 24 21* 24 21* 23 22 20*  GLUCOSE 137* 159* 137* 176* 122* 94 105*  BUN 40* 47* 27* 51* 33* 41* 45*  CREATININE 3.12* 2.99* 2.07* 3.21* 2.08* 2.79* 3.15*  ALBUMIN 1.9*  --   --  1.9*  --  1.8* 1.8*  CALCIUM 7.2* 7.3* 7.4* 7.3* 7.2* 7.2* 7.2*  PHOS 2.9  --   --  3.8  --  3.5 3.9   Liver Function Tests: Recent Labs  Lab 11/17/20 1843 11/19/20 0506 11/20/20 0539  ALBUMIN 1.9* 1.8* 1.8*   No results for input(s): LIPASE, AMYLASE in the last 168 hours. No results for input(s): AMMONIA in the last 168 hours. CBC: Recent Labs  Lab 11/16/20 0528 11/17/20 1844 11/18/20 0458 11/19/20 0506 11/20/20 0539  WBC 5.3 7.6 8.8 7.3 7.3  HGB 8.5* 8.2* 8.9* 7.7* 7.5*  HCT 27.2* 26.6* 29.2* 24.9* 24.4*  MCV 94.1 95.3 93.6 93.6 94.9  PLT 171 207 209 177 192   Cardiac Enzymes: No  results for input(s): CKTOTAL, CKMB, CKMBINDEX, TROPONINI in the last 168 hours. CBG: No results for input(s): GLUCAP in the last 168 hours.  Iron Studies: No results for input(s): IRON, TIBC, TRANSFERRIN, FERRITIN in the last 72 hours. Studies/Results: No results found. Marland Kitchen amiodarone  200 mg Oral Daily  . calcitRIOL  0.5 mcg Oral Daily  . Chlorhexidine Gluconate Cloth  6 each Topical Q0600  . Chlorhexidine Gluconate Cloth  6 each Topical Q0600  . [START ON 11/21/2020] darbepoetin (ARANESP) injection - DIALYSIS  200 mcg Intravenous Q Wed-HD  . famotidine  20 mg Oral Daily  . feeding supplement  237 mL Oral BID BM  . finasteride  5 mg Oral Daily  . fludrocortisone  0.2 mg Oral Daily  . metoprolol tartrate  12.5 mg Oral BID  . midodrine  10 mg Oral TID with meals  . multivitamin  1 tablet Oral QHS  . polyethylene glycol  17 g Oral Q1H  . potassium chloride  40 mEq Oral Q6H  . simvastatin  20 mg Oral QPM  . sodium chloride flush  3 mL Intravenous Q12H    BMET    Component Value Date/Time   NA  128 (L) 11/20/2020 0539   K 3.3 (L) 11/20/2020 0539   CL 99 11/20/2020 0539   CO2 20 (L) 11/20/2020 0539   GLUCOSE 105 (H) 11/20/2020 0539   BUN 45 (H) 11/20/2020 0539   CREATININE 3.15 (H) 11/20/2020 0539   CALCIUM 7.2 (L) 11/20/2020 0539   GFRNONAA 20 (L) 11/20/2020 0539   GFRAA 19 (L) 06/15/2018 0407   CBC    Component Value Date/Time   WBC 7.3 11/20/2020 0539   RBC 2.57 (L) 11/20/2020 0539   HGB 7.5 (L) 11/20/2020 0539   HCT 24.4 (L) 11/20/2020 0539   PLT 192 11/20/2020 0539   MCV 94.9 11/20/2020 0539   MCH 29.2 11/20/2020 0539   MCHC 30.7 11/20/2020 0539   RDW 19.9 (H) 11/20/2020 0539   LYMPHSABS 0.5 (L) 11/02/2020 1209   MONOABS 1.2 (H) 11/02/2020 1209   EOSABS 0.0 11/02/2020 1209   BASOSABS 0.1 11/02/2020 1209    Assessment/Plan:  1. RecurrentSyncope/hypotension; sig orthostasis:On TID Midodrine and florinef. HD with minimal/no UFdue to good UOP.Keeping net  even on HD. 1. Please follow orthostatics (none documented) and ambulate with PT as tolerated. 2. florinef increased to 0.2 daily 12/20 2. Colitis- Ranee Gosselin been following and he completed a course ofcipro/flagyl. He had colonoscopy on 10/17/20 -did noteblood in the rectum and sigmoid colon, in the descending colon and at the splenic flexure; colitis involving descending and sigmoid colons seen. Biopsies performed. Per GI and surgery. Has had extensive work up and interventions however diagnosis is still elusive. 5-HIAA leves WNL. Repeat c-scope per GI 3. Urinary retention- this is new. Pt with significant UOP. Improved with foley catheter and may need outpatient Urology evaluation. Unclear if this is drug related or due to BPH 4. ESRDrecent start on MWF at [facility]. 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 1. No amyloid deposits reports, I do not have access to his full biopsy report 2. Last HD Sat 12/18, has had adequate urine output with stable labs, monitor for now. Holding HD for now. No acute indications for renal replacement therapy at this time. 5. Anemia:of CKD, possibly some GIB- s/p blood transfusion. No heparin with HD. GI on board, colonoscopyfindings as above. Rec 1u prbc 11/22 1. Transfuse as needed to keep Hgb >7 2. On Aranesp weekly- will inc dose- iron stores low on 12/10- will replete 6. CKD-MBD: calcitriol 0.64mcg daily 7. Severe protein malnutrition:per primary, push protein  8. Vascular access- will need to f/u with Dr. Donnetta Hutching once stable for discharge. 9. A fib- on amiodarone and eliquis (off A/C as of right now) 10. Hyponatremia. Monitor, 137Na bath 11. Disposition- pending ability to safely ambulate before discharge.  Gean Quint, MD [facility]

## 2020-11-20 NOTE — Plan of Care (Signed)

## 2020-11-20 NOTE — Progress Notes (Signed)
Subjective: Feels fine. Reports 2 loose BMs over night. States he typically having about 2 loose BMs every 12 hours which is improved. No brbpr or melena. Orthostatic hypotension continues to be a problem. No abdominal pain, nausea, or vomiting.   Nursing staff at bedside. Getting ready to start MiraLAX prep.   Objective: Vital signs in last 24 hours: Temp:  [97.8 F (36.6 C)-99.2 F (37.3 C)] 99.2 F (37.3 C) (12/21 0602) Pulse Rate:  [68-75] 75 (12/21 0602) Resp:  [16-18] 18 (12/21 0602) BP: (117-132)/(62-72) 117/62 (12/21 0602) SpO2:  [98 %-100 %] 98 % (12/21 0602) Weight:  [67.8 kg] 67.8 kg (12/21 0602) Last BM Date: 11/19/20 General:   Alert and oriented, pleasant, NAD Head:  Normocephalic and atraumatic. Eyes:  No icterus, sclera clear. Conjuctiva pink.  Abdomen:  Bowel sounds present, soft, non-tender, non-distended. No HSM or hernias noted. No rebound or guarding. No masses appreciated  Extremities:  Without edema. Neurologic:  Alert and  oriented x4;  grossly normal neurologically. Psych:  Normal mood and affect.  Intake/Output from previous day: 12/20 0701 - 12/21 0700 In: 590.6 [P.O.:480; IV Piggyback:110.6] Out: 1750 [Urine:1750] Intake/Output this shift: Total I/O In: 480 [P.O.:480] Out: -   Lab Results: Recent Labs    11/18/20 0458 11/19/20 0506 11/20/20 0539  WBC 8.8 7.3 7.3  HGB 8.9* 7.7* 7.5*  HCT 29.2* 24.9* 24.4*  PLT 209 177 192   BMET Recent Labs    11/18/20 0458 11/19/20 0506 11/20/20 0539  NA 131* 128* 128*  K 3.1* 3.2* 3.3*  CL 97* 97* 99  CO2 23 22 20*  GLUCOSE 122* 94 105*  BUN 33* 41* 45*  CREATININE 2.08* 2.79* 3.15*  CALCIUM 7.2* 7.2* 7.2*   LFT Recent Labs    11/17/20 1843 11/19/20 0506 11/20/20 0539  ALBUMIN 1.9* 1.8* 1.8*   Assessment: 69 year old male well-known to [place] from recent several week admission for diarrhea and rectal bleeding, found to have CT findings (without contrast) of colitis, developing rectal  bleeding during hospitalization in the setting of Eliquis, receiving multiple units of packed red blood cells, undergoing colonoscopy during past recent admission with poor prep and incomplete exam. Blood noted in the rectum, sigmoid, descending colon, splenic flexure.Colonoscopy findings suspicious for segmental or ischemic colitis. Pathology without changes or features of IBD, query drug-induced or ischemic. CMV stains negative.He was started empirically on steroids with mild improvement in diarrhea. Prior hospitalization received empiric course of Cipro and Flagyl. Prior hospitalization negative Cdiff and GI pathogen panel.Patient presented back to the hospital 12/3 due to an episode of syncope while getting up to go to dialysis from SNF.  Thus far, he has undergone extensive evaluation including negative repeat Cdiff, GI pathogen, O&P, negative celiac serologies, stool osmolality and electrolytes with findings of secretory diarrhea. 24 hour urinary 5HIAA negative. CTA without evidence for chronic mesenteric ischemia. Bowel regimen attempted has included Creon (now discontinued), Colestid 4 grams daily now discontinued,  course of octreotide now discontinued, prednisone course with taper now finished as of 12/18, imodium TID, and most recently pepto bismol QID starting 12/18.   Today, he is reporting some improvement in stool frequency with about 4 BMs total in the last 24 hours. Documented 5 BMs yesterday. Continues to be [place] 7. No overt GI bleeding.  As management of diarrhea has been challenging and etiology of diarrhea has not been identified, patient is completing a bowel prep today with plans for repeat colonoscopy tomorrow. Will need to consider referral  to tertiary care center if no improvement.   Anemia:  Notably, hemoglobin seems to be fluctuating between 7.5 to 8 range.  Slow decline over the last 48 hours from 8.9 on 12/19 to 7.5 this morning.  No overt GI bleeding.  Suspect  downtrending hemoglobin is likely multifactorial in the setting of acute illness/ESRD/and possible slow occult GI bleed, previously noting blood prior colonoscopy.  We will need to continue to monitor this closely and transfuse as needed. Plan to repeat colonoscopy tomorrow.  Plan: Clear liquids today. Complete MiraLAX prep today.  Colonoscopy with propofol with Dr. Laural Golden tomorrow. The risks, benefits, and alternatives have been discussed with the patient in detail. The patient states understanding and desires to proceed.  NPO midnight.  Monitor for overt GI bleeding.  Monitor H/H Transfuse as necessary.  Correction of electrolytes per hospitalists.  Further recommendations to follow colonoscopy.    LOS: 18 days    11/20/2020, 11:20 AM   Aliene Altes, [facility]

## 2020-11-20 NOTE — Progress Notes (Addendum)
PROGRESS NOTE        PATIENT DETAILS Name: [name] Age: 69 y.o. Sex: male Date of Birth: 1951/04/24 Admit Date: 11/02/2020 Admitting Physician Pratik Darleen Crocker, DO EBR:AXENMM[facility], No Pcp Per  Brief Narrative: Patient is a 69 y.o. male ESRD on HD MWF, DM-2, atrial fibrillation, HLD-recent hospitalization for colitis/diarrhea-presented to the hospital for evaluation of syncope due to orthostatic hypotension.  Hospital course complicated by ongoing diarrhea and persistent orthostatic hypotension.  Significant events: 11/12-12/2>> hospitalization-syncope/orthostatic hypotension-colitis-rectal bleeding-acute blood loss anemia- GI work-up including colonoscopy-Eliquis held-ultimately discharged to SNF on steroids. 12/3>> syncope while at SNF-readmitted-severe orthostatic hypotension-ongoing diarrhea due to colitis.    Significant studies: 11/13>> Echo: EF 68-08%, grade 1 diastolic dysfunction 81/1>> CTA abdomen/pelvis: Gaseous distention of the sigmoid/transverse colon, bilateral kidneys are atrophic.  Cholelithiasis.  No significant vascular abnormalities. 12/13>> urine 5-HIAA: Normal limits 12/17>> MRI brain: No acute findings.  Procedures : 11/17>> colonoscopy:Colitis involving descending and sigmoid colon.  Pathology: 11/17>> colon biopsy: Severely active chronic nonspecific colitis with ulceration-negative for granulomas or dysplasia.  Stains for CMV negative.  Microbiology data: 12/3>> C. difficile PCR negative. 12/6>> stool ova/parasites: Negative 12/7>> GI pathogen panel: Negative  Antimicrobial therapy: Ciprofloxacin: 11/13>> 11/22 Flagyl: 11/13>> 11/22  Consults: GI, nephrology, cardiology  DVT Prophylaxis : SCDs Start: 11/02/20 1520   Subjective: Continues to have diarrhea-continues to feel dizzy when he stands up.  Assessment/Plan: Syncope: Due to recurrent orthostatic hypotension-continue midodrine/Florinef/lower extremity stockings with  abdominal binder.  Echo with preserved EF.  Diarrhea: GI following-has completed a course of Cipro/Flagyl-and prednisone taper with not much improvement.  No longer on octreotide infusion as 5-HIAA negative.  Extensive work-up comlpeted (see above).  GI following-with plans to pursue repeat colonoscopy tomorrow.    ESRD: Recent EGD start-usually on HD MWF-given excellent urine output-stable electrolytes-nephrology plans on holding HD for now and seeing how he does.  Defer further to nephrology  PAF: Continue amiodarone/metoprolol-Eliquis on hold per cardiology recommendations during recent hospitalization due to rectal bleeding and acute blood loss anemia.  Anticoagulation to be addressed upon follow-up with cardiology in the outpatient setting  HLD: Continue statin  Anemia: Multifactorial-likely due to acute illness/ESRD-recent acute blood loss anemia due to rectal bleeding.  No obvious blood loss-but hemoglobin continues to slowly downtrend-continue to follow closely.  Per prior documentation-required around 5 units of PRBC during his most recent hospitalization.    Hypokalemia/hypomagnesemia: Continue to replete and recheck  Hyponatremia: Due to impaired free water excretion in the setting of renal disease.  Currently asymptomatic-follow closely.  Acute urinary retention-s/p Foley placement on 12/16: Continue to follow closely-avoiding Flomax due to orthostatic hypotension-start finasteride-we will plan on voiding trial in the next few days.   Diet: Diet Order            Diet clear liquid Room service appropriate? Yes; Fluid consistency: Thin  Diet effective 0500 tomorrow                  Code Status: Full code  Family Communication: Patient prefers to call family himself.  Disposition Plan: Status is: Inpatient  Remains inpatient appropriate because:Inpatient level of care appropriate due to severity of illness   Dispo: The patient is from: SNF              Anticipated  d/c is to: SNF  Anticipated d/c date is: > 3 days              Patient currently is not medically stable to d/c.   Barriers to Discharge: Ongoing diarrhea and orthostatic hypotension  Antimicrobial agents: Anti-infectives (From admission, onward)   None       Time spent: 25-minutes-Greater than 50% of this time was spent in counseling, explanation of diagnosis, planning of further management, and coordination of care.  MEDICATIONS: Scheduled Meds: . amiodarone  200 mg Oral Daily  . calcitRIOL  0.5 mcg Oral Daily  . Chlorhexidine Gluconate Cloth  6 each Topical Q0600  . Chlorhexidine Gluconate Cloth  6 each Topical Q0600  . [START ON 11/21/2020] darbepoetin (ARANESP) injection - DIALYSIS  200 mcg Intravenous Q Wed-HD  . famotidine  20 mg Oral Daily  . feeding supplement  237 mL Oral BID BM  . finasteride  5 mg Oral Daily  . fludrocortisone  0.2 mg Oral Daily  . metoprolol tartrate  12.5 mg Oral BID  . midodrine  10 mg Oral TID with meals  . multivitamin  1 tablet Oral QHS  . polyethylene glycol  17 g Oral Q1H  . potassium chloride  40 mEq Oral Q6H  . simvastatin  20 mg Oral QPM  . sodium chloride flush  3 mL Intravenous Q12H   Continuous Infusions: . sodium chloride    . sodium chloride    . sodium chloride    . ferric gluconate (FERRLECIT/NULECIT) IV 125 mg (11/20/20 0932)  . magnesium sulfate bolus IVPB     PRN Meds:.sodium chloride, sodium chloride, sodium chloride, acetaminophen **OR** acetaminophen, heparin, heparin, lidocaine (PF), lidocaine-prilocaine, ondansetron **OR** ondansetron (ZOFRAN) IV, pentafluoroprop-tetrafluoroeth, sodium chloride flush   PHYSICAL EXAM: Vital signs: Vitals:   11/19/20 0523 11/19/20 1344 11/19/20 2035 11/20/20 0602  BP: 112/69 132/72 126/66 117/62  Pulse: 80 73 68 75  Resp: 20 16 16 18   Temp: 98.4 F (36.9 C) 97.9 F (36.6 C) 97.8 F (36.6 C) 99.2 F (37.3 C)  TempSrc: Oral Oral  Oral  SpO2: 98% 98% 100% 98%   Weight: 68 kg   67.8 kg  Height:       Filed Weights   11/18/20 0500 11/19/20 0523 11/20/20 0602  Weight: 68.1 kg 68 kg 67.8 kg   Body mass index is 19.19 kg/m.  Gen Exam:Alert awake-not in any distress HEENT:atraumatic, normocephalic Chest: B/L clear to auscultation anteriorly CVS:S1S2 regular Abdomen:soft non tender, non distended Extremities:no edema Neurology: Non focal Skin: no rash  I have personally reviewed following labs and imaging studies  LABORATORY DATA: CBC: Recent Labs  Lab 11/16/20 0528 11/17/20 1844 11/18/20 0458 11/19/20 0506 11/20/20 0539  WBC 5.3 7.6 8.8 7.3 7.3  HGB 8.5* 8.2* 8.9* 7.7* 7.5*  HCT 27.2* 26.6* 29.2* 24.9* 24.4*  MCV 94.1 95.3 93.6 93.6 94.9  PLT 171 207 209 177 976    Basic Metabolic Panel: Recent Labs  Lab 11/14/20 0544 11/15/20 0627 11/16/20 0528 11/17/20 1843 11/18/20 0458 11/19/20 0506 11/20/20 0539  NA 131* 127* 131* 130* 131* 128* 128*  K 3.3* 3.9 3.5 3.4* 3.1* 3.2* 3.3*  CL 98 97* 97* 99 97* 97* 99  CO2 24 21* 24 21* 23 22 20*  GLUCOSE 137* 159* 137* 176* 122* 94 105*  BUN 40* 47* 27* 51* 33* 41* 45*  CREATININE 3.12* 2.99* 2.07* 3.21* 2.08* 2.79* 3.15*  CALCIUM 7.2* 7.3* 7.4* 7.3* 7.2* 7.2* 7.2*  MG  --  1.5* 1.7  --  1.5* 1.8 1.9  PHOS 2.9  --   --  3.8  --  3.5 3.9    GFR: Estimated Creatinine Clearance: 20.9 mL/min (A) (by C-G formula based on SCr of 3.15 mg/dL (H)).  Liver Function Tests: Recent Labs  Lab 11/14/20 0544 11/17/20 1843 11/19/20 0506 11/20/20 0539  ALBUMIN 1.9* 1.9* 1.8* 1.8*   No results for input(s): LIPASE, AMYLASE in the last 168 hours. No results for input(s): AMMONIA in the last 168 hours.  Coagulation Profile: No results for input(s): INR, PROTIME in the last 168 hours.  Cardiac Enzymes: No results for input(s): CKTOTAL, CKMB, CKMBINDEX, TROPONINI in the last 168 hours.  BNP (last 3 results) No results for input(s): PROBNP in the last 8760 hours.  Lipid Profile: No  results for input(s): CHOL, HDL, LDLCALC, TRIG, CHOLHDL, LDLDIRECT in the last 72 hours.  Thyroid Function Tests: No results for input(s): TSH, T4TOTAL, FREET4, T3FREE, THYROIDAB in the last 72 hours.  Anemia Panel: No results for input(s): VITAMINB12, FOLATE, FERRITIN, TIBC, IRON, RETICCTPCT in the last 72 hours.  Urine analysis:    Component Value Date/Time   COLORURINE YELLOW 10/12/2020 1622   APPEARANCEUR CLEAR 10/12/2020 1622   LABSPEC 1.008 10/12/2020 1622   PHURINE 8.0 10/12/2020 1622   GLUCOSEU >=500 (A) 10/12/2020 1622   HGBUR NEGATIVE 10/12/2020 1622   BILIRUBINUR NEGATIVE 10/12/2020 1622   KETONESUR NEGATIVE 10/12/2020 1622   PROTEINUR 100 (A) 10/12/2020 1622   NITRITE NEGATIVE 10/12/2020 1622   LEUKOCYTESUR NEGATIVE 10/12/2020 1622    Sepsis Labs: Lactic Acid, Venous    Component Value Date/Time   LATICACIDVEN 2.6 (HH) 11/02/2020 1357    MICROBIOLOGY: Recent Results (from the past 240 hour(s))  SARS Coronavirus 2 by RT PCR (hospital order, performed in [place] hospital lab) Nasopharyngeal Nasopharyngeal Swab     Status: None   Collection Time: 11/11/20 10:49 AM   Specimen: Nasopharyngeal Swab  Result Value Ref Range Status   SARS Coronavirus 2 NEGATIVE NEGATIVE Final    Comment: Performed at [facility], [address], [place], [place] [address]    RADIOLOGY STUDIES/RESULTS: No results found.   LOS: 18 days   Oren Binet, MD  [facility]    To contact the attending provider between 7A-7P or the covering provider during after hours 7P-7A, please log into the web site [email or web address] and access using universal [place] password for that web site. If you do not have the password, please call the hospital operator.  11/20/2020, 9:45 AM

## 2020-11-20 NOTE — Progress Notes (Signed)
Spoke with patient's nurse Raquel Sarna at 3:50 p.m. this afternoon.  Patient is about to receive his sixth and last dose of his MiraLAX prep and has only had 2 additional large watery brown BMs since prep was started.  We will plan to complete a second MiraLAX prep starting at 6 PM this evening.  Reinforced the importance of patient drinking at least 4 additional ounces of water 30 minutes after each MiraLAX dose.

## 2020-11-20 NOTE — Progress Notes (Addendum)
Dr Laural Golden notified per request of stool results, new orders given.  Pt not clear as of yet. 2 more doses of miralax to give; order now discontinued.

## 2020-11-21 ENCOUNTER — Encounter (HOSPITAL_COMMUNITY): Payer: Self-pay | Admitting: Internal Medicine

## 2020-11-21 ENCOUNTER — Inpatient Hospital Stay (HOSPITAL_COMMUNITY): Payer: Medicare Other | Admitting: Anesthesiology

## 2020-11-21 ENCOUNTER — Encounter (HOSPITAL_COMMUNITY)
Admission: EM | Disposition: A | Discharge status: Other Acute Inpatient Hospital | Payer: Self-pay | Source: Skilled Nursing Facility | Attending: Family Medicine

## 2020-11-21 DIAGNOSIS — K529 Noninfective gastroenteritis and colitis, unspecified: Secondary | ICD-10-CM

## 2020-11-21 DIAGNOSIS — D123 Benign neoplasm of transverse colon: Secondary | ICD-10-CM

## 2020-11-21 DIAGNOSIS — K633 Ulcer of intestine: Secondary | ICD-10-CM

## 2020-11-21 DIAGNOSIS — R197 Diarrhea, unspecified: Secondary | ICD-10-CM

## 2020-11-21 DIAGNOSIS — D122 Benign neoplasm of ascending colon: Secondary | ICD-10-CM

## 2020-11-21 HISTORY — PX: COLONOSCOPY WITH PROPOFOL: SHX5780

## 2020-11-21 HISTORY — PX: BIOPSY: SHX5522

## 2020-11-21 HISTORY — PX: POLYPECTOMY: SHX5525

## 2020-11-21 LAB — CBC
HCT: 25.5 % — ABNORMAL LOW (ref 39.0–52.0)
Hemoglobin: 7.9 g/dL — ABNORMAL LOW (ref 13.0–17.0)
MCH: 29.5 pg (ref 26.0–34.0)
MCHC: 31 g/dL (ref 30.0–36.0)
MCV: 95.1 fL (ref 80.0–100.0)
Platelets: 213 10*3/uL (ref 150–400)
RBC: 2.68 MIL/uL — ABNORMAL LOW (ref 4.22–5.81)
RDW: 19.9 % — ABNORMAL HIGH (ref 11.5–15.5)
WBC: 6 10*3/uL (ref 4.0–10.5)
nRBC: 0 % (ref 0.0–0.2)

## 2020-11-21 LAB — RENAL FUNCTION PANEL
Albumin: 1.8 g/dL — ABNORMAL LOW (ref 3.5–5.0)
Anion gap: 10 (ref 5–15)
BUN: 43 mg/dL — ABNORMAL HIGH (ref 8–23)
CO2: 19 mmol/L — ABNORMAL LOW (ref 22–32)
Calcium: 7.4 mg/dL — ABNORMAL LOW (ref 8.9–10.3)
Chloride: 102 mmol/L (ref 98–111)
Creatinine, Ser: 3.13 mg/dL — ABNORMAL HIGH (ref 0.61–1.24)
GFR, Estimated: 21 mL/min — ABNORMAL LOW (ref >60)
Glucose, Bld: 92 mg/dL (ref 70–99)
Phosphorus: 3.8 mg/dL (ref 2.5–4.6)
Potassium: 3.8 mmol/L (ref 3.5–5.1)
Sodium: 131 mmol/L — ABNORMAL LOW (ref 135–145)

## 2020-11-21 LAB — PREPARE RBC (CROSSMATCH)

## 2020-11-21 LAB — HEMOGLOBIN AND HEMATOCRIT, BLOOD
HCT: 29.7 % — ABNORMAL LOW (ref 39.0–52.0)
Hemoglobin: 8.9 g/dL — ABNORMAL LOW (ref 13.0–17.0)

## 2020-11-21 SURGERY — COLONOSCOPY WITH PROPOFOL
Anesthesia: General

## 2020-11-21 MED ORDER — PROPOFOL 10 MG/ML IV BOLUS
INTRAVENOUS | Status: DC | PRN
  Administered 2020-11-21: 50 mg via INTRAVENOUS
  Administered 2020-11-21: 100 ug/kg/min via INTRAVENOUS

## 2020-11-21 MED ORDER — STERILE WATER FOR IRRIGATION IR SOLN
Status: DC | PRN
  Administered 2020-11-21: 100 mL

## 2020-11-21 MED ORDER — SODIUM CHLORIDE 0.9 % IV SOLN
INTRAVENOUS | Status: DC
  Administered 2020-11-21: 1000 mL via INTRAVENOUS

## 2020-11-21 MED ORDER — SODIUM CHLORIDE 0.9 % IV SOLN: INTRAVENOUS | Status: DC | PRN

## 2020-11-21 MED ORDER — LACTATED RINGERS IV SOLN: INTRAVENOUS | Status: DC | PRN

## 2020-11-21 MED ORDER — FUROSEMIDE 10 MG/ML IJ SOLN
20.0000 mg | Freq: Once | INTRAMUSCULAR | Status: AC
  Administered 2020-11-21: 20 mg via INTRAVENOUS
  Filled 2020-11-21: qty 2

## 2020-11-21 MED ORDER — MAGNESIUM CITRATE PO SOLN
0.5000 | Freq: Once | ORAL | Status: AC
  Administered 2020-11-21: 0.5 via ORAL
  Filled 2020-11-21: qty 296

## 2020-11-21 MED ORDER — EPHEDRINE SULFATE-NACL 50-0.9 MG/10ML-% IV SOSY
PREFILLED_SYRINGE | INTRAVENOUS | Status: DC | PRN
  Administered 2020-11-21 (×2): 5 mg via INTRAVENOUS

## 2020-11-21 MED ORDER — SODIUM CHLORIDE 0.9% IV SOLUTION: Freq: Once | INTRAVENOUS | Status: AC

## 2020-11-21 NOTE — Anesthesia Preprocedure Evaluation (Addendum)
Anesthesia Evaluation  Patient identified by MRN, date of birth, ID band Patient awake    Reviewed: Allergy & Precautions, NPO status , Patient's Chart, lab work & pertinent test results  History of Anesthesia Complications Negative for: history of anesthetic complications  Airway Mallampati: II  TM Distance: >3 FB Neck ROM: Full    Dental  (+) Dental Advisory Given, Implants, Caps   Pulmonary neg pulmonary ROS,    Pulmonary exam normal breath sounds clear to auscultation       Cardiovascular Exercise Tolerance: Good Normal cardiovascular exam+ dysrhythmias Atrial Fibrillation  Rhythm:Regular Rate:Normal  12-Oct-2020 13:32:41 [facility]-AP-ED ROUTINE RECORD Sinus rhythm with frequent Premature ventricular complexes Nonspecific ST abnormality Abnormal ECG Artifact Since last tracing PVC new Otherwise no significant change Confirmed by Daleen Bo [phone]) on 10/12/2020 3:25:17 PM   Neuro/Psych  Neuromuscular disease negative psych ROS   GI/Hepatic Neg liver ROS, Bowel prep,Lower GI bleeding   Endo/Other  diabetes, Well Controlled, Type 2, Oral Hypoglycemic Agents  Renal/GU ESRF and DialysisRenal disease  negative genitourinary   Musculoskeletal negative musculoskeletal ROS (+)   Abdominal   Peds negative pediatric ROS (+)  Hematology  (+) anemia ,   Anesthesia Other Findings   Reproductive/Obstetrics negative OB ROS                             Anesthesia Physical  Anesthesia Plan  ASA: III  Anesthesia Plan: General   Post-op Pain Management:    Induction: Intravenous  PONV Risk Score and Plan: TIVA  Airway Management Planned: Nasal Cannula, Natural Airway and Simple Face Mask  Additional Equipment:   Intra-op Plan:   Post-operative Plan:   Informed Consent: I have reviewed the patients History and Physical, chart, labs and discussed the procedure  including the risks, benefits and alternatives for the proposed anesthesia with the patient or authorized representative who has indicated his/her understanding and acceptance.     Dental advisory given  Plan Discussed with: CRNA and Surgeon  Anesthesia Plan Comments:         Anesthesia Quick Evaluation

## 2020-11-21 NOTE — Op Note (Signed)
[facility] Patient Name: [name] Procedure Date: 11/21/2020 3:16 PM MRN: [id number] Date of Birth: [birth year] Attending MD: Hildred Laser , MD CSN: [id number] Age: 69 Admit Type: Inpatient Procedure:                Colonoscopy Indications:              Diarrhea, Follow-up of colitis Providers:                Hildred Laser, MD, Lambert Mody, Nelma Rothman,                            Technician Referring MD:              Medicines:                Propofol per Anesthesia Complications:            No immediate complications. Estimated Blood Loss:     Estimated blood loss was minimal. Procedure:                Pre-Anesthesia Assessment:                           - Prior to the procedure, a History and Physical                            was performed, and patient medications and                            allergies were reviewed. The patient's tolerance of                            previous anesthesia was also reviewed. The risks                            and benefits of the procedure and the sedation                            options and risks were discussed with the patient.                            All questions were answered, and informed consent                            was obtained. Prior Anticoagulants: The patient has                            taken no previous anticoagulant or antiplatelet                            agents. ASA Grade Assessment: IV - A patient with                            severe systemic disease that is a constant threat  to life. After reviewing the risks and benefits,                            the patient was deemed in satisfactory condition to                            undergo the procedure.                           After obtaining informed consent, the colonoscope                            was passed under direct vision. Throughout the                            procedure, the patient's blood pressure, pulse,  and                            oxygen saturations were monitored continuously. The                            [id number]) was introduced through the anus                            and advanced to the the terminal ileum, with                            identification of the appendiceal orifice and IC                            valve. The colonoscopy was performed without                            difficulty. The patient tolerated the procedure                            well. The quality of the bowel preparation was good                            except the sigmoid colon was fair. The terminal                            ileum, ileocecal valve, appendiceal orifice, and                            rectum were photographed. Scope In: 3:26:41 PM Scope Out: 4:05:16 PM Scope Withdrawal Time: 0 hours 32 minutes 26 seconds  Total Procedure Duration: 0 hours 38 minutes 35 seconds  Findings:      The perianal and digital rectal examinations were normal.      The terminal ileum appeared normal. Biopsies were taken with a cold       forceps for histology. The pathology specimen was placed into Bottle       Number 1.      A 15 mm polyp  was found in the proximal ascending colon. The polyp was       multi-lobulated. The polyp was removed with a piecemeal technique using       a cold snare. Resection and retrieval were complete. One hemostatic clip       was successfully placed (MR conditional). For hemostasis, one hemostatic       clip was successfully placed (MR conditional). There was no bleeding at       the end of the procedure. The pathology specimen was placed into Bottle       Number 3. Biopsies for histology were taken with a cold forceps from the       ascending colon for evaluation of microscopic colitis. The pathology       specimen was placed into Bottle Number 2.      Two polyps were found in the hepatic flexure. The polyps were 5 to 8 mm       in size. These polyps were removed  with a cold snare. Resection and       retrieval were complete. The pathology specimen was placed into Bottle       Number 3.      Extensive ulceration ulcers were found in the distal sigmoid colon. No       bleeding was present. Biopsies were taken with a cold forceps for       histology. The pathology specimen was placed into Bottle Number 4.      The rectum, proximal sigmoid colon, descending colon, splenic flexure       and transverse colon appeared normal.      The retroflexed view of the distal rectum and anal verge was normal and       showed no anal or rectal abnormalities. Impression:               - The examined portion of the ileum was normal.                            Biopsied.                           - One 15 mm polyp in the proximal ascending colon,                            removed piecemeal using a cold snare. Resected and                            retrieved. Clip (MR conditional) was placed.                            Biopsied.                           - Two 5 to 8 mm polyps at the hepatic flexure,                            removed with a cold snare. Resected and retrieved.                           - Extensive circumferential ulceration ulcers in  the distal sigmoid colon. Ulcer appeared to be                            healing. Biopsied.                           - The rectum, proximal sigmoid colon, descending                            colon, splenic flexure and transverse colon are                            normal.                           - Stool sample taken for GI pathogen panel.                           Comment: Suspect ischemic injury. Repeating stool                            study to make sure infection not missed. Moderate Sedation:      Per Anesthesia Care Recommendation:           - Return patient to hospital ward for ongoing care.                           - Resume previous diet today.                           -  Continue present medications.                           - Await pathology results.                           - Repeat colonoscopy date to be determined after                            pending pathology results are reviewed for                            surveillance. Procedure Code(s):        --- Professional ---                           [id number], Colonoscopy, flexible; with removal of                            tumor(s), polyp(s), or other lesion(s) by snare                            technique                           00174, 73, Colonoscopy, flexible; with biopsy,  single or multiple Diagnosis Code(s):        --- Professional ---                           K63.5, Polyp of colon                           K63.3, Ulcer of intestine                           R19.7, Diarrhea, unspecified                           K52.9, Noninfective gastroenteritis and colitis,                            unspecified CPT copyright 2019 American Medical Association. All rights reserved. The codes documented in this report are preliminary and upon coder review may  be revised to meet current compliance requirements. Hildred Laser, MD Hildred Laser, MD 11/21/2020 4:38:29 PM This report has been signed electronically. Number of Addenda: 0

## 2020-11-21 NOTE — Plan of Care (Signed)

## 2020-11-21 NOTE — Anesthesia Postprocedure Evaluation (Signed)
Anesthesia Post Note  Patient: [name]  Procedure(s) Performed: COLONOSCOPY WITH PROPOFOL (N/A ) BIOPSY POLYPECTOMY  Patient location during evaluation: PACU Anesthesia Type: General Level of consciousness: awake and alert and oriented Pain management: pain level controlled Vital Signs Assessment: post-procedure vital signs reviewed and stable Respiratory status: spontaneous breathing and respiratory function stable Cardiovascular status: blood pressure returned to baseline and stable Postop Assessment: no apparent nausea or vomiting Anesthetic complications: no   No complications documented.   Last Vitals:  Vitals:   11/21/20 1615 11/21/20 1630  BP: (!) 103/59 138/69  Pulse: 64 62  Resp: 19 15  Temp: 36.4 C   SpO2: 100% 100%    Last Pain:  Vitals:   11/21/20 1615  TempSrc:   PainSc: 0-No pain                 Gibril Mastro C Jinna Weinman

## 2020-11-21 NOTE — Progress Notes (Signed)
Discussed planned colonoscopy today, patient remains agreeable. Per nursing, patient still with liquid brown stools. Will order half bottle Mag Citrate to help with prep this morning.   Labs this morning with hgb 7.5 (within recent baseline), no leukocytosis. Mild hyponatremia (improved compared to yesterday). Potassium noromal. History ESRD on HD.  Ordered 1 unit PRBC.   Will proceed with colonoscopy as scheduled.   Thank you for allowing [place] to participate in the care of [name]  Walden Field, DNP, AGNP-C Adult & Gerontological Nurse Practitioner [facility]

## 2020-11-21 NOTE — Progress Notes (Addendum)
Brief colonoscopy note  Normal terminal ileum.  Biopsies taken. Somewhat dilated proximal: 15 mm ulcer lobulated polyp at ascending colon.  Snared piecemeal and and single Hemoclip applied. 2 more polyps cold snared from hepatic flexure and submitted together Random biopsies taken for normal mucosa proximal colon Extensive ulceration to distal sigmoid colon.  Biopsy taken. Rectal mucosa normal. Stool sample taken for GI pathogen panel.

## 2020-11-21 NOTE — Progress Notes (Signed)
Patient ID: [name], male   DOB: 19-Sep-1951, 69 y.o.   MRN: [id number]   S: unable to have orthostats assessed yesterday- having diarrhea from prep.  Has been having hiccups on and off for 6 weeks now otherwise denies any fevers, chest pain, shortness of breath, orthopnea, nausea/vomiting, dysgeusia, loss of appetite, swelling, brain fog, pruritus. uop 1.5L   O:BP 136/78 (BP Location: Right Arm)   Pulse 73   Temp 98.4 F (36.9 C) (Oral)   Resp 20   Ht 6\' 2"  (1.88 m)   Wt 68.3 kg   SpO2 99%   BMI 19.33 kg/m   Intake/Output Summary (Last 24 hours) at 11/21/2020 0811 Last data filed at 11/21/2020 0505 Gross per 24 hour  Intake 1448.32 ml  Output 1550 ml  Net -101.68 ml   Intake/Output: I/O last 3 completed shifts: In: 1558.9 [P.O.:1320; IV Piggyback:238.9] Out: 2300 [Urine:2300]  Intake/Output this shift:  No intake/output data recorded. Weight change: 0.5 kg Gen: NAD CVS: RRR Resp: cta Abd: +BS, soft, NT/ND abdominal binder in place Ext: no edema, TED stockings in place Access: rij tdc c/d/i  Recent Labs  Lab 11/15/20 0627 11/16/20 0528 11/17/20 1843 11/18/20 0458 11/19/20 0506 11/20/20 0539  NA 127* 131* 130* 131* 128* 128*  K 3.9 3.5 3.4* 3.1* 3.2* 3.3*  CL 97* 97* 99 97* 97* 99  CO2 21* 24 21* 23 22 20*  GLUCOSE 159* 137* 176* 122* 94 105*  BUN 47* 27* 51* 33* 41* 45*  CREATININE 2.99* 2.07* 3.21* 2.08* 2.79* 3.15*  ALBUMIN  --   --  1.9*  --  1.8* 1.8*  CALCIUM 7.3* 7.4* 7.3* 7.2* 7.2* 7.2*  PHOS  --   --  3.8  --  3.5 3.9   Liver Function Tests: Recent Labs  Lab 11/17/20 1843 11/19/20 0506 11/20/20 0539  ALBUMIN 1.9* 1.8* 1.8*   No results for input(s): LIPASE, AMYLASE in the last 168 hours. No results for input(s): AMMONIA in the last 168 hours. CBC: Recent Labs  Lab 11/16/20 0528 11/17/20 1844 11/18/20 0458 11/19/20 0506 11/20/20 0539  WBC 5.3 7.6 8.8 7.3 7.3  HGB 8.5* 8.2* 8.9* 7.7* 7.5*  HCT 27.2* 26.6* 29.2* 24.9* 24.4*  MCV 94.1  95.3 93.6 93.6 94.9  PLT 171 207 209 177 192   Cardiac Enzymes: No results for input(s): CKTOTAL, CKMB, CKMBINDEX, TROPONINI in the last 168 hours. CBG: No results for input(s): GLUCAP in the last 168 hours.  Iron Studies: No results for input(s): IRON, TIBC, TRANSFERRIN, FERRITIN in the last 72 hours. Studies/Results: No results found. Marland Kitchen amiodarone  200 mg Oral Daily  . calcitRIOL  0.5 mcg Oral Daily  . Chlorhexidine Gluconate Cloth  6 each Topical Q0600  . Chlorhexidine Gluconate Cloth  6 each Topical Q0600  . darbepoetin (ARANESP) injection - DIALYSIS  200 mcg Intravenous Q Wed-HD  . famotidine  20 mg Oral Daily  . feeding supplement  237 mL Oral BID BM  . finasteride  5 mg Oral Daily  . fludrocortisone  0.2 mg Oral Daily  . metoprolol tartrate  12.5 mg Oral BID  . midodrine  10 mg Oral TID with meals  . multivitamin  1 tablet Oral QHS  . simvastatin  20 mg Oral QPM  . sodium chloride flush  3 mL Intravenous Q12H    BMET    Component Value Date/Time   NA 128 (L) 11/20/2020 0539   K 3.3 (L) 11/20/2020 0539   CL 99 11/20/2020  0539   CO2 20 (L) 11/20/2020 0539   GLUCOSE 105 (H) 11/20/2020 0539   BUN 45 (H) 11/20/2020 0539   CREATININE 3.15 (H) 11/20/2020 0539   CALCIUM 7.2 (L) 11/20/2020 0539   GFRNONAA 20 (L) 11/20/2020 0539   GFRAA 19 (L) 06/15/2018 0407   CBC    Component Value Date/Time   WBC 7.3 11/20/2020 0539   RBC 2.57 (L) 11/20/2020 0539   HGB 7.5 (L) 11/20/2020 0539   HCT 24.4 (L) 11/20/2020 0539   PLT 192 11/20/2020 0539   MCV 94.9 11/20/2020 0539   MCH 29.2 11/20/2020 0539   MCHC 30.7 11/20/2020 0539   RDW 19.9 (H) 11/20/2020 0539   LYMPHSABS 0.5 (L) 11/02/2020 1209   MONOABS 1.2 (H) 11/02/2020 1209   EOSABS 0.0 11/02/2020 1209   BASOSABS 0.1 11/02/2020 1209    Assessment/Plan:  1. RecurrentSyncope/hypotension; sig orthostasis:On TID Midodrine and florinef. HD with minimal/no UFdue to good UOP.Keeping net even on HD. 1. Please follow  orthostatics and ambulate with PT  2. Midodrine 10mg  TID 3. Maintain abd binder and compression stockings 4. florinef increased to 0.2 daily 12/20 2. Colitis- Ranee Gosselin been following and he completed a course ofcipro/flagyl. He had colonoscopy on 10/17/20 -did noteblood in the rectum and sigmoid colon, in the descending colon and at the splenic flexure; colitis involving descending and sigmoid colons seen. Biopsies performed. Per GI and surgery. Has had extensive work up and interventions however diagnosis is still elusive. 5-HIAA leves WNL. Repeat c-scope per GI 3. Urinary retention- this is new. Pt with significant UOP. Improved with foley catheter and may need outpatient Urology evaluation. Unclear if this is drug related or due to BPH 4. [facility] 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 1. No amyloid deposits reports, I do not have access to his full biopsy report 2. Last HD Sat 12/18, has had adequate urine output with stable labs, monitor for now. Holding HD for now. No acute indications for renal replacement therapy at this time. 3. Labs ordered for today 5. Anemia:of CKD, possibly some GIB- s/p blood transfusion. No heparin with HD. GI on board, colonoscopyfindings as above. Rec 1u prbc 11/22 1. Transfuse as needed to keep Hgb >7 2. On Aranesp weekly- will inc dose- iron stores low on 12/10- will replete 6. CKD-MBD: calcitriol 0.48mcg daily 7. Severe protein malnutrition:per primary, push protein 8. Vascular access- will need to f/u with Dr. Donnetta Hutching once stable for discharge. 9. A fib- on amiodarone and eliquis (off A/C as of right now) 10. Hyponatremia. Monitor, 137Na bath 11. Disposition- pending ability to safely ambulate before  discharge.  Gean Quint, MD [facility]

## 2020-11-21 NOTE — Progress Notes (Signed)
PROGRESS NOTE   [name]  NOI:[id number] DOB: 1951/01/28 DOA: 11/02/2020 PCP: Patient, No Pcp Per   Chief Complaint  Patient presents with  . Loss of Consciousness    Brief Admission History:  69 y.o. male ESRD on HD MWF, DM-2, atrial fibrillation, HLD-recent hospitalization for colitis/diarrhea-presented to the hospital for evaluation of syncope due to orthostatic hypotension.  Hospital course complicated by ongoing diarrhea and persistent orthostatic hypotension. Assessment & Plan:   Active Problems:   Syncope due to orthostatic hypotension   ESRD on hemodialysis (HCC)   Orthostatic Syncope - Pt has been improving after starting midodrine, florinef, LE stockings and abdominal binder.   Chronic diarrhea -rectal tube has been removed, diarrhea has been improving.  Plan is for colonoscopy later today.  He is currently being prepped by GI service.   ESRD on HD - continue HD per nephrology service.  Temporarily holding HD given good urine output.  Following lytes closely.   PAF - stable on amiodarone/metoprolol.  Apixaban was held by cardiology due to recent rectal bleeding and acute blood loss anemia.  Anticoagulation to be addressed by outpatient cardiology follow up.   Anemia in CKD - stable, following.   HLD - resume home statin therapy.   Acute urinary retention - s/p foley placement 12/16.  Plan for voiding trial 12/23.  Outpatient urology follow up.    DVT prophylaxis:  Full  Code Status: full  Family Communication: Pt prefers to convey to family healthcare info Disposition:  SNF when medically cleared by GI service  Status is: Inpatient  Remains inpatient appropriate because:Unsafe d/c plan and Inpatient level of care appropriate due to severity of illness   Dispo:  Patient From: [place]  Planned Disposition: [place]  Expected discharge date: 11/26/2020  Medically stable for discharge: No  Consultants:   GI    Nephrology  cardiology  Procedures:  11/17>> colon biopsy: Severely active chronic nonspecific colitis with ulceration-negative for granulomas or dysplasia.  Stains for CMV negative.  Antimicrobials:  Ciprofloxacin: 11/13>> 11/22 Flagyl: 11/13>> 11/22   Subjective: Pt reports that he is being prepped for colonoscopy today.  He denies complaints.   Objective: Vitals:   11/21/20 0634 11/21/20 1100 11/21/20 1150 11/21/20 1210  BP: 136/78 131/68 139/73 138/74  Pulse: 73 64 63 66  Resp: 20 20 18 18   Temp: 98.4 F (36.9 C) 97.9 F (36.6 C) 98 F (36.7 C) 98 F (36.7 C)  TempSrc: Oral Oral Oral Oral  SpO2: 99% 100% 100% 100%  Weight:      Height:        Intake/Output Summary (Last 24 hours) at 11/21/2020 1256 Last data filed at 11/21/2020 0505 Gross per 24 hour  Intake 968.32 ml  Output 1550 ml  Net -581.68 ml   Filed Weights   11/19/20 0523 11/20/20 0602 11/21/20 0600  Weight: 68 kg 67.8 kg 68.3 kg    Examination:  General exam: chronically ill appearing male, Appears calm and comfortable  Respiratory system: Clear to auscultation. Respiratory effort normal. Cardiovascular system: S1 & S2 heard, RRR. No JVD, murmurs, rubs, gallops or clicks. No pedal edema. Gastrointestinal system: Abdomen is nondistended, soft and nontender. No organomegaly or masses felt. Normal bowel sounds heard. Central nervous system: Alert and oriented. No focal neurological deficits. Extremities: Symmetric 5 x 5 power. Skin: No rashes, lesions or ulcers Psychiatry: Judgement and insight appear normal. Mood & affect appropriate.   Data Reviewed: I have personally reviewed following labs and  imaging studies  CBC: Recent Labs  Lab 11/17/20 1844 11/18/20 0458 11/19/20 0506 11/20/20 0539 11/21/20 0911  WBC 7.6 8.8 7.3 7.3 6.0  HGB 8.2* 8.9* 7.7* 7.5* 7.9*  HCT 26.6* 29.2* 24.9* 24.4* 25.5*  MCV 95.3 93.6 93.6 94.9 95.1  PLT 207 209 177 192 503    Basic Metabolic Panel: Recent  Labs  Lab 11/15/20 0627 11/16/20 0528 11/17/20 1843 11/18/20 0458 11/19/20 0506 11/20/20 0539 11/21/20 0911  NA 127* 131* 130* 131* 128* 128* 131*  K 3.9 3.5 3.4* 3.1* 3.2* 3.3* 3.8  CL 97* 97* 99 97* 97* 99 102  CO2 21* 24 21* 23 22 20* 19*  GLUCOSE 159* 137* 176* 122* 94 105* 92  BUN 47* 27* 51* 33* 41* 45* 43*  CREATININE 2.99* 2.07* 3.21* 2.08* 2.79* 3.15* 3.13*  CALCIUM 7.3* 7.4* 7.3* 7.2* 7.2* 7.2* 7.4*  MG 1.5* 1.7  --  1.5* 1.8 1.9  --   PHOS  --   --  3.8  --  3.5 3.9 3.8    GFR: Estimated Creatinine Clearance: 21.2 mL/min (A) (by C-G formula based on SCr of 3.13 mg/dL (H)).  Liver Function Tests: Recent Labs  Lab 11/17/20 1843 11/19/20 0506 11/20/20 0539 11/21/20 0911  ALBUMIN 1.9* 1.8* 1.8* 1.8*    CBG: No results for input(s): GLUCAP in the last 168 hours.  No results found for this or any previous visit (from the past 240 hour(s)).   Radiology Studies: No results found.   Scheduled Meds: . amiodarone  200 mg Oral Daily  . calcitRIOL  0.5 mcg Oral Daily  . Chlorhexidine Gluconate Cloth  6 each Topical Q0600  . Chlorhexidine Gluconate Cloth  6 each Topical Q0600  . darbepoetin (ARANESP) injection - DIALYSIS  200 mcg Intravenous Q Wed-HD  . famotidine  20 mg Oral Daily  . feeding supplement  237 mL Oral BID BM  . finasteride  5 mg Oral Daily  . fludrocortisone  0.2 mg Oral Daily  . furosemide  20 mg Intravenous Once  . metoprolol tartrate  12.5 mg Oral BID  . midodrine  10 mg Oral TID with meals  . multivitamin  1 tablet Oral QHS  . simvastatin  20 mg Oral QPM  . sodium chloride flush  3 mL Intravenous Q12H   Continuous Infusions: . sodium chloride    . sodium chloride    . sodium chloride    . ferric gluconate (FERRLECIT/NULECIT) IV 125 mg (11/21/20 0929)     LOS: 19 days   Time spent: 64 mins   Lakiyah Arntson Wynetta Emery, MD How to contact the [facility] Attending or Consulting provider [place] or covering provider during after hours [place], for this  patient?  1. Check the care team in [facility] and look for a) attending/consulting [facility] provider listed and b) the [facility] team listed 2. Log into [email or web address] and use [place] universal password to access. If you do not have the password, please contact the hospital operator. 3. Locate the [facility] provider you are looking for under [facility] and page to a number that you can be directly reached. 4. If you still have difficulty reaching the provider, please page the [facility] (Director on Call) for the Hospitalists listed on amion for assistance.  11/21/2020, 12:56 PM

## 2020-11-21 NOTE — Transfer of Care (Signed)
Immediate Anesthesia Transfer of Care Note  Patient: [name]  Procedure(s) Performed: COLONOSCOPY WITH PROPOFOL (N/A ) BIOPSY POLYPECTOMY  Patient Location: PACU  Anesthesia Type:General  Level of Consciousness: awake, sedated and patient cooperative  Airway & Oxygen Therapy: Patient Spontanous Breathing and Patient connected to nasal cannula oxygen  Post-op Assessment: Report given to RN and Post -op Vital signs reviewed and stable  Post vital signs: Reviewed and stable  Last Vitals:  Vitals Value Taken Time  BP 103/59 1616  Temp 97.6 1616  Pulse 64 11/21/20 1616  Resp 19 11/21/20 1616  SpO2 100 % 11/21/20 1616  Vitals shown include unvalidated device data.  Last Pain:  Vitals:   11/21/20 1521  TempSrc:   PainSc: 0-No pain      Patients Stated Pain Goal: 5 (41/14/64 3142)  Complications: No complications documented.

## 2020-11-22 DIAGNOSIS — R197 Diarrhea, unspecified: Secondary | ICD-10-CM | POA: Diagnosis not present

## 2020-11-22 DIAGNOSIS — K529 Noninfective gastroenteritis and colitis, unspecified: Secondary | ICD-10-CM | POA: Diagnosis not present

## 2020-11-22 LAB — GASTROINTESTINAL PANEL BY PCR, STOOL (REPLACES STOOL CULTURE)

## 2020-11-22 LAB — TYPE AND SCREEN
ABO/RH(D): A POS
Antibody Screen: NEGATIVE
Unit division: 0

## 2020-11-22 LAB — CBC
HCT: 29.7 % — ABNORMAL LOW (ref 39.0–52.0)
Hemoglobin: 9 g/dL — ABNORMAL LOW (ref 13.0–17.0)
MCH: 28.7 pg (ref 26.0–34.0)
MCHC: 30.3 g/dL (ref 30.0–36.0)
MCV: 94.6 fL (ref 80.0–100.0)
Platelets: 233 10*3/uL (ref 150–400)
RBC: 3.14 MIL/uL — ABNORMAL LOW (ref 4.22–5.81)
RDW: 19.9 % — ABNORMAL HIGH (ref 11.5–15.5)
WBC: 5.9 10*3/uL (ref 4.0–10.5)
nRBC: 0 % (ref 0.0–0.2)

## 2020-11-22 LAB — BASIC METABOLIC PANEL
Anion gap: 10 (ref 5–15)
BUN: 41 mg/dL — ABNORMAL HIGH (ref 8–23)
CO2: 20 mmol/L — ABNORMAL LOW (ref 22–32)
Calcium: 7.4 mg/dL — ABNORMAL LOW (ref 8.9–10.3)
Chloride: 102 mmol/L (ref 98–111)
Creatinine, Ser: 3.34 mg/dL — ABNORMAL HIGH (ref 0.61–1.24)
GFR, Estimated: 19 mL/min — ABNORMAL LOW (ref >60)
Glucose, Bld: 133 mg/dL — ABNORMAL HIGH (ref 70–99)
Potassium: 3.9 mmol/L (ref 3.5–5.1)
Sodium: 132 mmol/L — ABNORMAL LOW (ref 135–145)

## 2020-11-22 LAB — ALBUMIN: Albumin: 1.9 g/dL — ABNORMAL LOW (ref 3.5–5.0)

## 2020-11-22 LAB — BPAM RBC
Blood Product Expiration Date: 202201182359
ISSUE DATE / TIME: 202112221118
Unit Type and Rh: 6200

## 2020-11-22 LAB — SARS CORONAVIRUS 2 BY RT PCR (HOSPITAL ORDER, PERFORMED IN ~~LOC~~ HOSPITAL LAB): SARS Coronavirus 2: NEGATIVE

## 2020-11-22 MED ORDER — BISMUTH SUBSALICYLATE 262 MG/15ML PO SUSP
30.0000 mL | Freq: Four times a day (QID) | ORAL | Status: AC
  Administered 2020-11-22 – 2020-12-06 (×37): 30 mL via ORAL
  Filled 2020-11-22 (×5): qty 118

## 2020-11-22 MED ORDER — LOPERAMIDE HCL 2 MG PO CAPS
2.0000 mg | ORAL_CAPSULE | Freq: Three times a day (TID) | ORAL | Status: AC
  Administered 2020-11-22 – 2020-12-06 (×43): 2 mg via ORAL
  Filled 2020-11-22 (×43): qty 1

## 2020-11-22 NOTE — Plan of Care (Signed)

## 2020-11-22 NOTE — Progress Notes (Signed)
Subjective:  BM less frequent. Does not feel very hungry. PT coming today. Denies abdominal pain.  Objective: Vital signs in last 24 hours: Temp:  [97.3 F (36.3 C)-98.6 F (37 C)] 98.2 F (36.8 C) (12/23 0520) Pulse Rate:  [62-78] 71 (12/23 0520) Resp:  [13-20] 18 (12/23 0520) BP: (103-144)/(59-79) 137/79 (12/23 0520) SpO2:  [97 %-100 %] 99 % (12/23 0520) Last BM Date: 11/21/20 General:   Alert,  Well-developed, well-nourished, pleasant and cooperative in NAD Head:  Normocephalic and atraumatic. Eyes:  Sclera clear, no icterus.  Abdomen:  Soft, nontender and nondistended. Normal bowel sounds, without guarding, and without rebound.   Extremities:  Without clubbing, deformity or edema. Neurologic:  Alert and  oriented x4;  grossly normal neurologically. Skin:  Intact without significant lesions or rashes. Psych:  Alert and cooperative. Normal mood and affect.  Intake/Output from previous day: 12/22 0701 - 12/23 0700 In: 813 [I.V.:465; Blood:348] Out: 1525 [Urine:1525] Intake/Output this shift: No intake/output data recorded.  Lab Results: CBC Recent Labs    11/20/20 0539 11/21/20 0911 11/21/20 2112 11/22/20 0616  WBC 7.3 6.0  --  5.9  HGB 7.5* 7.9* 8.9* 9.0*  HCT 24.4* 25.5* 29.7* 29.7*  MCV 94.9 95.1  --  94.6  PLT 192 213  --  233   BMET Recent Labs    11/20/20 0539 11/21/20 0911 11/22/20 0616  NA 128* 131* 132*  K 3.3* 3.8 3.9  CL 99 102 102  CO2 20* 19* 20*  GLUCOSE 105* 92 133*  BUN 45* 43* 41*  CREATININE 3.15* 3.13* 3.34*  CALCIUM 7.2* 7.4* 7.4*   LFTs Recent Labs    11/20/20 0539 11/21/20 0911 11/22/20 0616  ALBUMIN 1.8* 1.8* 1.9*   No results for input(s): LIPASE in the last 72 hours. PT/INR No results for input(s): LABPROT, INR in the last 72 hours.    Imaging Studies: MR BRAIN WO CONTRAST  Result Date: 11/16/2020 CLINICAL DATA:  Question deposition disease. Hypotension. Altered mental status. EXAM: MRI HEAD WITHOUT CONTRAST  TECHNIQUE: Multiplanar, multiecho pulse sequences of the brain and surrounding structures were obtained without intravenous contrast. COMPARISON:  Head CT 01/25/2020 FINDINGS: Brain: Diffusion imaging does not show any acute or subacute infarction. No focal abnormality affects brainstem or cerebellum. Cerebral hemispheres show mild age related volume loss without evidence of small-vessel disease or large vessel infarction. No mass lesion, hemorrhage, hydrocephalus or extra-axial collection. Vascular: Major vessels at the base of the brain show flow. Skull and upper cervical spine: Negative Sinuses/Orbits: Clear/normal Other: None IMPRESSION: No acute or reversible finding. Mild age related volume loss. No evidence of small-vessel disease or large vessel infarction. Electronically Signed   By: Nelson Chimes M.D.   On: 11/16/2020 14:01   DG CHEST PORT 1 VIEW  Result Date: 10/29/2020 CLINICAL DATA:  Hypoxia, orthostatic hypotension, atrial fibrillation EXAM: PORTABLE CHEST 1 VIEW COMPARISON:  10/12/2020 FINDINGS: Single frontal view of the chest demonstrates right internal jugular dialysis catheter unchanged. Cardiac silhouette is stable. Interval development of bibasilar veiling opacities compatible with consolidation and/or effusion. There is central vascular congestion. No pneumothorax. No acute bony abnormalities. IMPRESSION: 1. Findings compatible with fluid overload and bilateral pleural effusions. Electronically Signed   By: Randa Ngo M.D.   On: 10/29/2020 20:00   DG Abd Portable 1V  Result Date: 11/15/2020 CLINICAL DATA:  Abdominal distension EXAM: PORTABLE ABDOMEN - 1 VIEW COMPARISON:  10/25/2020 FINDINGS: The subdiaphragmatic region and right flank are excluded from view. Multiple gas-filled prominent loops of  large and small bowel are seen throughout the visualized abdomen in keeping with un underlying ileus. No gross free intraperitoneal gas. Gas and stool is seen within the rectal vault. No  organomegaly. No acute bone abnormality. IMPRESSION: Mild ileus. Electronically Signed   By: Fidela Salisbury MD   On: 11/15/2020 04:15   DG Abd Portable 1V  Result Date: 10/25/2020 CLINICAL DATA:  Abdominal distension. EXAM: PORTABLE ABDOMEN - 1 VIEW COMPARISON:  10/22/2020 FINDINGS: There is marked diffuse gaseous distension of the colon the appearance is not significantly changed when compared with 10/22/2020. A large stool burden is identified within the right colon, unchanged. No new findings. IMPRESSION: 1. No change in marked gaseous distension of the colon compatible with colonic ileus. 2. Unchanged large stool burden within the right colon. Electronically Signed   By: Kerby Moors M.D.   On: 10/25/2020 08:07   CT Angio Abd/Pel w/ and/or w/o  Result Date: 11/07/2020 CLINICAL DATA:  Evaluate for mesenteric ischemia. End-stage renal disease on dialysis. Bowel wall thickening on exam from 10/12/2020. EXAM: CTA ABDOMEN AND PELVIS WITHOUT AND WITH CONTRAST TECHNIQUE: Multidetector CT imaging of the abdomen and pelvis was performed using the standard protocol during bolus administration of intravenous contrast. Multiplanar reconstructed images and MIPs were obtained and reviewed to evaluate the vascular anatomy. CONTRAST:  65mL OMNIPAQUE IOHEXOL 350 MG/ML SOLN COMPARISON:  10/22/2020 FINDINGS: VASCULAR Aorta: Mild atherosclerotic disease in the abdominal aorta without aneurysm or dissection. Celiac: Celiac trunk is patent without significant stenosis. Focal dilatation of the trunk measures up to 9 mm. Main branch vessels are patent. SMA: Replaced right hepatic artery. SMA is widely patent without significant stenosis. No evidence for aneurysm or dissection. Renals: Main right renal artery is widely patent without stenosis, aneurysm or dissection. There is small accessory right renal artery. High-grade focal narrowing in the proximal main left renal artery without significant plaque in this area. Findings  could be related to a dissection but indeterminate. Small accessory left renal artery is patent. IMA: Patent Inflow: Atherosclerotic disease involving the proximal left common iliac artery without significant stenosis. Common iliac arteries are patent bilaterally. Disease and stenosis involving the right internal iliac artery. Left internal iliac artery is patent. Bilateral external iliac arteries are widely patent. Proximal Outflow: Proximal femoral arteries are patent bilaterally. Veins: Main portal venous system is patent. Limited evaluation of the superior cavoatrial junction on the arterial phase imaging due to non-opacified blood in this area. IVC and renal veins are patent. Narrowing of the left common iliac vein from the right common iliac artery is a normal anatomic variant. Review of the MIP images confirms the above findings. NON-VASCULAR Lower chest: Small bilateral pleural effusions with compressive atelectasis. Hepatobiliary: High-density material in the gallbladder are suggestive for small stones. The gallbladder is moderately distended without definite inflammatory changes. There is colon anterior to the liver. No discrete liver lesion. No biliary dilatation. Gallbladder distension has minimally changed since the previous examination. Pancreas: Unremarkable. No pancreatic ductal dilatation or surrounding inflammatory changes. Spleen: Normal in size without focal abnormality. Adrenals/Urinary Tract: Normal appearance of the adrenal glands. Both kidneys are small with scattered calcifications. Findings are suggestive for nonobstructive renal calculi. Stomach/Bowel: Normal appearance of the stomach and duodenum. There is a rectal catheter with an inflated balloon. Sigmoid colon is moderately distended with mild wall thickening. However, the sigmoid wall thickening is less impressive on the venous phase imaging. Cecum is distended and contains a large amount of stool. Transverse colon is distended with  gas. Lymphatic: No significant lymph node enlargement in the abdomen or pelvis. Reproductive: Stable appearance of the prostate. Other: Diffuse subcutaneous edema. Small amount of pelvic ascites which is new. Trace ascites in the left lower quadrant of the abdomen. Trace ascites in the upper abdomen. Negative for free air. Musculoskeletal: Chronic disc space narrowing with endplate changes at V5-I4. Stable disc space narrowing at L5-S1. IMPRESSION: VASCULAR 1. Atherosclerotic disease in the abdominal aorta without aneurysm or significant stenosis. Aortic Atherosclerosis (ICD10-I70.0). 2. Main mesenteric arteries are patent without significant stenosis. No evidence to suggest chronic mesenteric ischemia. 3. Multiple renal arteries as described. Focal narrowing the proximal left main renal artery that could be related to atherosclerotic disease or focal dissection in this area. NON-VASCULAR 1. The sigmoid colon has been partially decompressed since 10/22/2020 and placement of the rectal tube. There continues to be gaseous distension of the sigmoid colon and transverse colon. Mild wall thickening in the sigmoid colon is nonspecific but may be related to the partial decompression rather than infectious or inflammatory process. Findings are suggestive for an underlying colonic ileus. 2. Bilateral kidneys are atrophic with bilateral calcifications. Findings are compatible with history of end-stage renal disease. Calcifications could represent nonobstructive renal calculi. 3. Diffuse subcutaneous edema with small amount of ascites, most prominent in the pelvis. 4. Significant disc space disease at L2-L3. 5. Bilateral pleural effusions with compressive atelectasis at the lung bases. 6. Cholelithiasis with moderate gallbladder distension. Electronically Signed   By: Markus Daft M.D.   On: 11/07/2020 17:58  [2 weeks]   Assessment: 69 year old male well-known to [place] from recent several week admission for diarrhea and rectal  bleeding, found to have CT findings (without contrast) of colitis, developing rectal bleeding during hospitalization in setting of Eliquis, receiving multiple units of packed red blood cells, undergoing colonoscopy during past recent admission with poor prep and incomplete exam.  Blood noted in the rectum, sigmoid, descending colon, splenic flexure.  Colonoscopy findings suspicious for segmental or ischemic colitis.  Pathology without changes or features of IBD, query drug-induced or ischemic.  CMV stains negative.  Treated empirically with steroids with mild improvement in diarrhea.  Prior hospitalization received empiric treatment with Cipro and Flagyl.  C. difficile and GI pathogen panel have been negative.  Presented back to the hospital December 3 due to episode of syncope while getting up to go to dialysis from skilled nursing facility.  Extensive evaluation for diarrhea including negative repeat C. difficile, GI pathogen, O&P, negative celiac serologies, stool osmolality and electrolytes with findings of secretory diarrhea.  24-hour urinary 5 HIAA negative.  CTA without evidence for chronic mesenteric ischemia.  Noted improvement of colitis picture seen on prior imaging.  Also noted to have evidence of ileus.  Bowel regimen attempted has included Creon which has now been discontinued, Colestid 4 g daily now discontinued, octreotide discontinued, prednisone with taper finished December 18, Imodium 2 mg 3 times daily, most recently Pepto-Bismol 4 times daily starting December 18. Imodium and Pepto held during bowel prep. Patient reports stool frequency has improved.  Repeat colonoscopy 12/22 showed normal terminal ileum status post biopsies.  15 mm polyp, multilobulated removed piecemeal from the sigmoid colon.  One hemostatic clip placed.  2 polyps removed from the hepatic flexure.  Random biopsies from the ascending colon for evaluation of microscopic colitis.  Extensive ulceration noted in the distal  sigmoid colon status post biopsies.  Stool sample taken for GI pathogen panel suspect ischemic injury.  Anemia: Received 1 unit of  packed red blood cells yesterday, hemoglobin 7.9-->8.9-->9.Marland Kitchen   Plan: 1. Follow-up pathology. 2. Restart Pepto and Imodium.   Laureen Ochs. Bernarda Caffey [facility] [phone] 12/23/20219:32 AM     LOS: 20 days

## 2020-11-22 NOTE — Progress Notes (Signed)
Patient ID: [name], male   DOB: 1951-04-22, 69 y.o.   MRN: [id number] S: No new complaints.   O:BP 137/79 (BP Location: Left Arm)   Pulse 71   Temp 98.2 F (36.8 C) (Oral)   Resp 18   Ht 6\' 2"  (1.88 m)   Wt 68.3 kg   SpO2 99%   BMI 19.33 kg/m   Intake/Output Summary (Last 24 hours) at 11/22/2020 0941 Last data filed at 11/22/2020 0700 Gross per 24 hour  Intake 813 ml  Output 1525 ml  Net -712 ml   Intake/Output: I/O last 3 completed shifts: In: 409 [I.V.:465; Blood:348] Out: 2175 [Urine:2175]  Intake/Output this shift:  No intake/output data recorded. Weight change:  Gen: NAD CVS: RRR  Resp:CTA Abd: +BS, soft, NT/ND Ext: trace ankle edema bilaterally HD access:  [facility]  Recent Labs  Lab 11/16/20 0528 11/17/20 1843 11/18/20 0458 11/19/20 0506 11/20/20 0539 11/21/20 0911 11/22/20 0616  NA 131* 130* 131* 128* 128* 131* 132*  K 3.5 3.4* 3.1* 3.2* 3.3* 3.8 3.9  CL 97* 99 97* 97* 99 102 102  CO2 24 21* 23 22 20* 19* 20*  GLUCOSE 137* 176* 122* 94 105* 92 133*  BUN 27* 51* 33* 41* 45* 43* 41*  CREATININE 2.07* 3.21* 2.08* 2.79* 3.15* 3.13* 3.34*  ALBUMIN  --  1.9*  --  1.8* 1.8* 1.8* 1.9*  CALCIUM 7.4* 7.3* 7.2* 7.2* 7.2* 7.4* 7.4*  PHOS  --  3.8  --  3.5 3.9 3.8  --    Liver Function Tests: Recent Labs  Lab 11/20/20 0539 11/21/20 0911 11/22/20 0616  ALBUMIN 1.8* 1.8* 1.9*   No results for input(s): LIPASE, AMYLASE in the last 168 hours. No results for input(s): AMMONIA in the last 168 hours. CBC: Recent Labs  Lab 11/18/20 0458 11/19/20 0506 11/20/20 0539 11/21/20 0911 11/21/20 2112 11/22/20 0616  WBC 8.8 7.3 7.3 6.0  --  5.9  HGB 8.9* 7.7* 7.5* 7.9* 8.9* 9.0*  HCT 29.2* 24.9* 24.4* 25.5* 29.7* 29.7*  MCV 93.6 93.6 94.9 95.1  --  94.6  PLT 209 177 192 213  --  233   Cardiac Enzymes: No results for input(s): CKTOTAL, CKMB, CKMBINDEX, TROPONINI in the last 168 hours. CBG: No results for input(s): GLUCAP in the last 168 hours.  Iron  Studies: No results for input(s): IRON, TIBC, TRANSFERRIN, FERRITIN in the last 72 hours. Studies/Results: No results found. Marland Kitchen amiodarone  200 mg Oral Daily  . bismuth subsalicylate  30 mL Oral Q6H  . calcitRIOL  0.5 mcg Oral Daily  . Chlorhexidine Gluconate Cloth  6 each Topical Q0600  . Chlorhexidine Gluconate Cloth  6 each Topical Q0600  . darbepoetin (ARANESP) injection - DIALYSIS  200 mcg Intravenous Q Wed-HD  . famotidine  20 mg Oral Daily  . feeding supplement  237 mL Oral BID BM  . finasteride  5 mg Oral Daily  . fludrocortisone  0.2 mg Oral Daily  . loperamide  2 mg Oral TID  . metoprolol tartrate  12.5 mg Oral BID  . midodrine  10 mg Oral TID with meals  . multivitamin  1 tablet Oral QHS  . simvastatin  20 mg Oral QPM  . sodium chloride flush  3 mL Intravenous Q12H    BMET    Component Value Date/Time   NA 132 (L) 11/22/2020 0616   K 3.9 11/22/2020 0616   CL 102 11/22/2020 0616   CO2 20 (L) 11/22/2020 8119  GLUCOSE 133 (H) 11/22/2020 0616   BUN 41 (H) 11/22/2020 0616   CREATININE 3.34 (H) 11/22/2020 0616   CALCIUM 7.4 (L) 11/22/2020 0616   GFRNONAA 19 (L) 11/22/2020 0616   GFRAA 19 (L) 06/15/2018 0407   CBC    Component Value Date/Time   WBC 5.9 11/22/2020 0616   RBC 3.14 (L) 11/22/2020 0616   HGB 9.0 (L) 11/22/2020 0616   HCT 29.7 (L) 11/22/2020 0616   PLT 233 11/22/2020 0616   MCV 94.6 11/22/2020 0616   MCH 28.7 11/22/2020 0616   MCHC 30.3 11/22/2020 0616   RDW 19.9 (H) 11/22/2020 0616   LYMPHSABS 0.5 (L) 11/02/2020 1209   MONOABS 1.2 (H) 11/02/2020 1209   EOSABS 0.0 11/02/2020 1209   BASOSABS 0.1 11/02/2020 1209    Assessment/Plan:  1. RecurrentSyncope/hypotension; sig orthostasis:On TID Midodrine and florinef. HD with minimal/no UFdue to good UOP.Keeping net even on HD. 1. Pleasefollow orthostatics and ambulate with PT  2. Midodrine 10mg  TID 3. Maintain abd binder and compression stockings 4. florinef increased to 0.2 daily  12/20 2. Colitis- Ranee Gosselin been following and he completed a course ofcipro/flagyl. He had colonoscopy on 10/17/20 -did noteblood in the rectum and sigmoid colon, in the descending colon and at the splenic flexure; colitis involving descending and sigmoid colons seen. Biopsies performed. Per GI and surgery. Has had extensive work up and interventions however diagnosis is still elusive. 5-HIAA levels WNL.  Colonoscopy 12/22 showed large sigmoid polyp and 2 from hepatic flexure.  Biopsies taken.  3. Urinary retention- this is new. Pt with significant UOP.Improved withfoley catheter andmay need outpatientUrology evaluation. Unclear if this is drug related or due to BPH 4. [facility]. 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 1. No amyloid deposits reported. 2. Last HD Sat 12/18, has had adequate urine output with stable labs, monitor for now. Holding HD for now. No acute indications for renal replacement therapy at this time. 3. BUN/Cr stable and will continue to hold HD for now. 5. Anemia:of CKD, possibly some GIB- s/p blood transfusion. No heparin with HD. GI on board, colonoscopyfindings as above. Rec 1u prbc 11/22 1. Transfuse as needed to keep Hgb >7 2. On Aranesp weekly- will inc dose- iron stores low on 12/10- will replete 6. 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 Severe protein malnutrition:per primary, push protein 8. Vascular access- will need to f/u with Dr. Donnetta Hutching once stable for discharge. 9. A fib- on amiodarone and eliquis (off A/C as of right now) 10. Hyponatremia. Monitor, 137Na bath 11. Disposition- pending ability to safely ambulate before discharge.   Donetta Potts, MD [name] [phone]

## 2020-11-22 NOTE — Progress Notes (Signed)
[name]  KGU:[id number] DOB: Jun 15, 1951 DOA: 11/02/2020 PCP: [name], No Pcp Per   Chief Complaint  [name] presents with  . Loss of Consciousness    Brief Admission History:  69 y.o. male ESRD on HD MWF, DM-2, atrial fibrillation, HLD-recent hospitalization for colitis/diarrhea-presented to the hospital for evaluation of syncope due to orthostatic hypotension.  Hospital course complicated by ongoing diarrhea and persistent orthostatic hypotension. Assessment & Plan:   Active Problems:   Syncope due to orthostatic hypotension   ESRD on hemodialysis (HCC)   Colitis   Orthostatic Syncope - Pt has been improving after starting midodrine, florinef, LE stockings and abdominal binder.  Continue working with PT.   Chronic diarrhea -rectal tube has been removed, diarrhea has been improving.  S/p colonoscopy 12/22.  Awaiting biopsy results.   ESRD on HD -  HD on hold for now per nephrology service.  Temporarily holding HD given good urine output.  Following lytes closely.   PAF - stable on amiodarone/metoprolol.  Apixaban was held by cardiology due to recent rectal bleeding and acute blood loss anemia.  Anticoagulation to be addressed by outpatient cardiology follow up.   Anemia in CKD - stable, following.   HLD - resume home statin therapy.   Acute urinary retention - s/p foley placement 12/16.  Plan for voiding trial 12/23.  Outpatient urology follow up.    DVT prophylaxis:  Full  Code Status: full  Family Communication: Pt prefers to convey to family healthcare info Disposition:  SNF when medically cleared by GI service  Status is: Inpatient  Remains inpatient appropriate because:Inpatient level of care appropriate due to severity of illness. Plan to DC to SNF tomorrow if ok with GI and remains stable.    Dispo:  [name] From: [place]  Planned Disposition: [place]  Expected discharge date: 11/23/2020  Medically stable  for discharge: No  Consultants:   GI   Nephrology  cardiology  Procedures:  11/17>> colon biopsy: Severely active chronic nonspecific colitis with ulceration-negative for granulomas or dysplasia.  Stains for CMV negative.  Antimicrobials:  Ciprofloxacin: 11/13>> 11/22 Flagyl: 11/13>> 11/22   Subjective: Pt reports no complaints.    Objective: Vitals:   11/21/20 1630 11/21/20 1641 11/21/20 2201 11/22/20 0520  BP: 138/69 (!) 144/71 (!) 142/75 137/79  Pulse: 62 65 78 71  Resp: 15 16 18 18   Temp:  98 F (36.7 C) (!) 97.3 F (36.3 C) 98.2 F (36.8 C)  TempSrc:  Oral  Oral  SpO2: 100% 100% 99% 99%  Weight:      Height:        Intake/Output Summary (Last 24 hours) at 11/22/2020 1509 Last data filed at 11/22/2020 0700 Gross per 24 hour  Intake 465 ml  Output 1525 ml  Net -1060 ml   Filed Weights   11/19/20 0523 11/20/20 0602 11/21/20 0600  Weight: 68 kg 67.8 kg 68.3 kg    Examination:  General exam: chronically ill appearing male, Appears calm and comfortable  Respiratory system: Clear to auscultation. Respiratory effort normal. Cardiovascular system: S1 & S2 heard. No JVD, murmurs, rubs, gallops or clicks. No pedal edema. Gastrointestinal system: Abdomen is nondistended, soft and nontender. No organomegaly or masses felt. Normal bowel sounds heard. Central nervous system: Alert and oriented. No focal neurological deficits. Extremities: Symmetric 5 x 5 power. Skin: No rashes, lesions or ulcers Psychiatry: Judgement and insight appear normal. Mood & affect appropriate.   Data Reviewed: I have personally  reviewed following labs and imaging studies  CBC: Recent Labs  Lab 11/18/20 0458 11/19/20 0506 11/20/20 0539 11/21/20 0911 11/21/20 2112 11/22/20 0616  WBC 8.8 7.3 7.3 6.0  --  5.9  HGB 8.9* 7.7* 7.5* 7.9* 8.9* 9.0*  HCT 29.2* 24.9* 24.4* 25.5* 29.7* 29.7*  MCV 93.6 93.6 94.9 95.1  --  94.6  PLT 209 177 192 213  --  366    Basic Metabolic  Panel: Recent Labs  Lab 11/16/20 0528 11/17/20 1843 11/18/20 0458 11/19/20 0506 11/20/20 0539 11/21/20 0911 11/22/20 0616  NA 131* 130* 131* 128* 128* 131* 132*  K 3.5 3.4* 3.1* 3.2* 3.3* 3.8 3.9  CL 97* 99 97* 97* 99 102 102  CO2 24 21* 23 22 20* 19* 20*  GLUCOSE 137* 176* 122* 94 105* 92 133*  BUN 27* 51* 33* 41* 45* 43* 41*  CREATININE 2.07* 3.21* 2.08* 2.79* 3.15* 3.13* 3.34*  CALCIUM 7.4* 7.3* 7.2* 7.2* 7.2* 7.4* 7.4*  MG 1.7  --  1.5* 1.8 1.9  --   --   PHOS  --  3.8  --  3.5 3.9 3.8  --     GFR: Estimated Creatinine Clearance: 19.9 mL/min (A) (by C-G formula based on SCr of 3.34 mg/dL (H)).  Liver Function Tests: Recent Labs  Lab 11/17/20 1843 11/19/20 0506 11/20/20 0539 11/21/20 0911 11/22/20 0616  ALBUMIN 1.9* 1.8* 1.8* 1.8* 1.9*    CBG: No results for input(s): GLUCAP in the last 168 hours.  Recent Results (from the past 240 hour(s))  Gastrointestinal Panel by PCR , Stool     Status: None   Collection Time: 11/21/20  3:46 PM   Specimen: Stool  Result Value Ref Range Status   Campylobacter species NOT DETECTED NOT DETECTED Final   Plesimonas shigelloides NOT DETECTED NOT DETECTED Final   Salmonella species NOT DETECTED NOT DETECTED Final   Yersinia enterocolitica NOT DETECTED NOT DETECTED Final   Vibrio species NOT DETECTED NOT DETECTED Final   Vibrio cholerae NOT DETECTED NOT DETECTED Final   Enteroaggregative E coli (EAEC) NOT DETECTED NOT DETECTED Final   Enteropathogenic E coli (EPEC) NOT DETECTED NOT DETECTED Final   Enterotoxigenic E coli (ETEC) NOT DETECTED NOT DETECTED Final   Shiga like toxin producing E coli (STEC) NOT DETECTED NOT DETECTED Final   Shigella/Enteroinvasive E coli (EIEC) NOT DETECTED NOT DETECTED Final   Cryptosporidium NOT DETECTED NOT DETECTED Final   Cyclospora cayetanensis NOT DETECTED NOT DETECTED Final   Entamoeba histolytica NOT DETECTED NOT DETECTED Final   Giardia lamblia NOT DETECTED NOT DETECTED Final   Adenovirus  F40/41 NOT DETECTED NOT DETECTED Final   Astrovirus NOT DETECTED NOT DETECTED Final   Norovirus GI/GII NOT DETECTED NOT DETECTED Final   Rotavirus A NOT DETECTED NOT DETECTED Final   Sapovirus (I, II, IV, and V) NOT DETECTED NOT DETECTED Final    Comment: Performed at [facility], [address] [place], [place] [address]  SARS Coronavirus 2 by RT PCR (hospital order, performed in [place] hospital lab) Nasopharyngeal Nasopharyngeal Swab     Status: None   Collection Time: 11/22/20 10:35 AM   Specimen: Nasopharyngeal Swab  Result Value Ref Range Status   SARS Coronavirus 2 NEGATIVE NEGATIVE Final    Comment: (NOTE) SARS-CoV-2 target nucleic acids are NOT DETECTED.  The SARS-CoV-2 RNA is generally detectable in upper and lower respiratory specimens during the acute phase of infection. The lowest concentration of SARS-CoV-2 viral copies this assay can detect is  250 copies / mL. A negative result does not preclude SARS-CoV-2 infection and should not be used as the sole basis for treatment or other [name] management decisions.  A negative result may occur with improper specimen collection / handling, submission of specimen other than nasopharyngeal swab, presence of viral mutation(s) within the areas targeted by this assay, and inadequate number of viral copies (<250 copies / mL). A negative result must be combined with clinical observations, [name] history, and epidemiological information.  Fact Sheet for Patients:   StrictlyIdeas.no  Fact Sheet for Healthcare Providers: BankingDealers.co.za  This test is not yet approved or  cleared by the [place] FDA and has been authorized for detection and/or diagnosis of SARS-CoV-2 by FDA under an Emergency Use Authorization (EUA).  This EUA will remain in effect (meaning this test can be used) for the duration of the COVID-19 declaration under Section 564(b)(1) of the Act, 21  U.S.C. section 360bbb-3(b)(1), unless the authorization is terminated or revoked sooner.  Performed at [facility], [address], [place], [place] [address]      Radiology Studies: No results found.   Scheduled Meds: . amiodarone  200 mg Oral Daily  . bismuth subsalicylate  30 mL Oral Q6H  . calcitRIOL  0.5 mcg Oral Daily  . Chlorhexidine Gluconate Cloth  6 each Topical Q0600  . Chlorhexidine Gluconate Cloth  6 each Topical Q0600  . darbepoetin (ARANESP) injection - DIALYSIS  200 mcg Intravenous Q Wed-HD  . famotidine  20 mg Oral Daily  . feeding supplement  237 mL Oral BID BM  . finasteride  5 mg Oral Daily  . fludrocortisone  0.2 mg Oral Daily  . loperamide  2 mg Oral TID  . metoprolol tartrate  12.5 mg Oral BID  . midodrine  10 mg Oral TID with meals  . multivitamin  1 tablet Oral QHS  . simvastatin  20 mg Oral QPM  . sodium chloride flush  3 mL Intravenous Q12H   Continuous Infusions: . sodium chloride    . sodium chloride    . sodium chloride    . ferric gluconate (FERRLECIT/NULECIT) IV 125 mg (11/22/20 0954)     LOS: 20 days   Time spent: 63 mins   Britlyn Martine Wynetta Emery, MD How to contact the [facility] Attending or Consulting provider [place] or covering provider during after hours [place], for this [name]?  1. Check the care team in [facility] and look for a) attending/consulting TRH provider listed and b) the [facility] team listed 2. Log into [email or web address] and use [place]'s universal password to access. If you do not have the password, please contact the hospital operator. 3. Locate the [facility] provider you are looking for under [facility] and page to a number that you can be directly reached. 4. If you still have difficulty reaching the provider, please page the [facility] (Director on Call) for the Hospitalists listed on amion for assistance.  11/22/2020, 3:09 PM

## 2020-11-23 DIAGNOSIS — D122 Benign neoplasm of ascending colon: Secondary | ICD-10-CM | POA: Diagnosis not present

## 2020-11-23 DIAGNOSIS — R197 Diarrhea, unspecified: Secondary | ICD-10-CM | POA: Diagnosis not present

## 2020-11-23 DIAGNOSIS — D123 Benign neoplasm of transverse colon: Secondary | ICD-10-CM | POA: Diagnosis not present

## 2020-11-23 DIAGNOSIS — K529 Noninfective gastroenteritis and colitis, unspecified: Secondary | ICD-10-CM | POA: Diagnosis not present

## 2020-11-23 LAB — RENAL FUNCTION PANEL
Albumin: 1.9 g/dL — ABNORMAL LOW (ref 3.5–5.0)
Anion gap: 12 (ref 5–15)
BUN: 40 mg/dL — ABNORMAL HIGH (ref 8–23)
CO2: 20 mmol/L — ABNORMAL LOW (ref 22–32)
Calcium: 7.6 mg/dL — ABNORMAL LOW (ref 8.9–10.3)
Chloride: 98 mmol/L (ref 98–111)
Creatinine, Ser: 3.35 mg/dL — ABNORMAL HIGH (ref 0.61–1.24)
GFR, Estimated: 19 mL/min — ABNORMAL LOW (ref >60)
Glucose, Bld: 100 mg/dL — ABNORMAL HIGH (ref 70–99)
Phosphorus: 3.2 mg/dL (ref 2.5–4.6)
Potassium: 3.7 mmol/L (ref 3.5–5.1)
Sodium: 130 mmol/L — ABNORMAL LOW (ref 135–145)

## 2020-11-23 LAB — CBC
HCT: 30.8 % — ABNORMAL LOW (ref 39.0–52.0)
Hemoglobin: 9.5 g/dL — ABNORMAL LOW (ref 13.0–17.0)
MCH: 29.4 pg (ref 26.0–34.0)
MCHC: 30.8 g/dL (ref 30.0–36.0)
MCV: 95.4 fL (ref 80.0–100.0)
Platelets: 254 10*3/uL (ref 150–400)
RBC: 3.23 MIL/uL — ABNORMAL LOW (ref 4.22–5.81)
RDW: 19.3 % — ABNORMAL HIGH (ref 11.5–15.5)
WBC: 7 10*3/uL (ref 4.0–10.5)
nRBC: 0 % (ref 0.0–0.2)

## 2020-11-23 MED ORDER — COVID-19 MRNA VACCINE (PFIZER) 30 MCG/0.3ML IM SUSP
0.3000 mL | Freq: Once | INTRAMUSCULAR | Status: AC
  Administered 2020-11-23: 0.3 mL via INTRAMUSCULAR
  Filled 2020-11-23: qty 0.3

## 2020-11-23 MED ORDER — METOCLOPRAMIDE HCL 5 MG/ML IJ SOLN
5.0000 mg | Freq: Three times a day (TID) | INTRAMUSCULAR | Status: AC
  Administered 2020-11-23 – 2020-11-24 (×3): 5 mg via INTRAVENOUS
  Filled 2020-11-23 (×3): qty 2

## 2020-11-23 MED ORDER — ALBUMIN HUMAN 25 % IV SOLN
50.0000 g | Freq: Every day | INTRAVENOUS | Status: AC
  Administered 2020-11-23 – 2020-11-25 (×3): 50 g via INTRAVENOUS
  Filled 2020-11-23 (×3): qty 200

## 2020-11-23 NOTE — Progress Notes (Signed)
   NEPHROLOGY NURSING NOTE:  RIJ TDC care completed;  Limbs flushed, heparin instilled.   Dressing and anti-microbial disc were changed.  Exit site is unremarkable.  Rockwell Alexandria, RN

## 2020-11-23 NOTE — Progress Notes (Signed)
PROGRESS NOTE   [name]  OHY:[id number] DOB: 18-Oct-1951 DOA: 11/02/2020 PCP: Patient, No Pcp Per   Chief Complaint  Patient presents with  . Loss of Consciousness    Brief Admission History:  69 y.o. male ESRD on HD MWF, DM-2, atrial fibrillation, HLD-recent hospitalization for colitis/diarrhea-presented to the hospital for evaluation of syncope due to orthostatic hypotension.  Hospital course complicated by ongoing diarrhea and persistent orthostatic hypotension. Assessment & Plan:   Active Problems:   Syncope due to orthostatic hypotension   ESRD on hemodialysis (HCC)   Colitis   Orthostatic Syncope - Pt has been improving after starting midodrine, florinef, LE stockings and abdominal binder.  Continue working with PT.   Adding IV albumin per Dr. Laural [place] 12/24 to increase oncotic pressures.   Chronic diarrhea -improving, rectal tube has been removed, diarrhea has been improving.  S/p colonoscopy 12/22.  Awaiting biopsy results.   ESRD on HD -  HD on hold for now per nephrology service.  Temporarily holding HD given good urine output.  Following lytes closely.   PAF - stable on amiodarone/metoprolol.  Apixaban was held by cardiology due to recent rectal bleeding and acute blood loss anemia.  Anticoagulation to be addressed by outpatient cardiology follow up.   Anemia in CKD - stable, following.   HLD - resume home statin therapy.   Acute urinary retention - s/p foley placement 12/16.  Foley removed 12/23 and he has been voiding.  Outpatient urology follow up.    DVT prophylaxis:  Full  Code Status: full  Family Communication: Pt prefers to convey to family healthcare info Disposition:  SNF when medically cleared by GI service  Status is: Inpatient  Remains inpatient appropriate because:Inpatient level of care appropriate due to severity of illness. Plan to DC to SNF tomorrow if ok with GI and remains stable.   Dispo:  Patient From: [place]  Planned  Disposition: [place]  Expected discharge date: 11/26/2020  Medically stable for discharge: No  Consultants:   GI   Nephrology  cardiology  Procedures:  11/17>> colon biopsy: Severely active chronic nonspecific colitis with ulceration-negative for granulomas or dysplasia.  Stains for CMV negative.  Antimicrobials:  Ciprofloxacin: 11/13>> 11/22 Flagyl: 11/13>> 11/22   Subjective: Pt reports no complaints.    Objective: Vitals:   11/22/20 2012 11/23/20 1050 11/23/20 1157 11/23/20 1500  BP: 130/74  103/66 126/75  Pulse: 70  73 72  Resp: 17 18    Temp: 97.8 F (36.6 C)   98.2 F (36.8 C)  TempSrc:    Oral  SpO2: 99%  99% 99%  Weight:      Height:        Intake/Output Summary (Last 24 hours) at 11/23/2020 1556 Last data filed at 11/23/2020 1500 Gross per 24 hour  Intake 720 ml  Output --  Net 720 ml   Filed Weights   11/19/20 0523 11/20/20 0602 11/21/20 0600  Weight: 68 kg 67.8 kg 68.3 kg    Examination:  General exam: chronically ill appearing male, Appears calm and comfortable  Respiratory system: Clear to auscultation. Respiratory effort normal. Cardiovascular system: S1 & S2 heard. No JVD, murmurs, rubs, gallops or clicks. No pedal edema. Gastrointestinal system: Abdomen is nondistended, soft and nontender. No organomegaly or masses felt. Normal bowel sounds heard. Central nervous system: Alert and oriented. No focal neurological deficits. Extremities: Symmetric 5 x 5 power. Skin: No rashes, lesions or ulcers Psychiatry: Judgement and insight appear normal. Mood &  affect appropriate.   Data Reviewed: I have personally reviewed following labs and imaging studies  CBC: Recent Labs  Lab 11/19/20 0506 11/20/20 0539 11/21/20 0911 11/21/20 2112 11/22/20 0616 11/23/20 0503  WBC 7.3 7.3 6.0  --  5.9 7.0  HGB 7.7* 7.5* 7.9* 8.9* 9.0* 9.5*  HCT 24.9* 24.4* 25.5* 29.7* 29.7* 30.8*  MCV 93.6 94.9 95.1  --  94.6 95.4  PLT 177 192 213  --   233 161    Basic Metabolic Panel: Recent Labs  Lab 11/17/20 1843 11/18/20 0458 11/19/20 0506 11/20/20 0539 11/21/20 0911 11/22/20 0616 11/23/20 0503  NA 130* 131* 128* 128* 131* 132* 130*  K 3.4* 3.1* 3.2* 3.3* 3.8 3.9 3.7  CL 99 97* 97* 99 102 102 98  CO2 21* 23 22 20* 19* 20* 20*  GLUCOSE 176* 122* 94 105* 92 133* 100*  BUN 51* 33* 41* 45* 43* 41* 40*  CREATININE 3.21* 2.08* 2.79* 3.15* 3.13* 3.34* 3.35*  CALCIUM 7.3* 7.2* 7.2* 7.2* 7.4* 7.4* 7.6*  MG  --  1.5* 1.8 1.9  --   --   --   PHOS 3.8  --  3.5 3.9 3.8  --  3.2    GFR: Estimated Creatinine Clearance: 19.8 mL/min (A) (by C-G formula based on SCr of 3.35 mg/dL (H)).  Liver Function Tests: Recent Labs  Lab 11/19/20 0506 11/20/20 0539 11/21/20 0911 11/22/20 0616 11/23/20 0503  ALBUMIN 1.8* 1.8* 1.8* 1.9* 1.9*    CBG: No results for input(s): GLUCAP in the last 168 hours.  Recent Results (from the past 240 hour(s))  Gastrointestinal Panel by PCR , Stool     Status: None   Collection Time: 11/21/20  3:46 PM   Specimen: Stool  Result Value Ref Range Status   Campylobacter species NOT DETECTED NOT DETECTED Final   Plesimonas shigelloides NOT DETECTED NOT DETECTED Final   Salmonella species NOT DETECTED NOT DETECTED Final   Yersinia enterocolitica NOT DETECTED NOT DETECTED Final   Vibrio species NOT DETECTED NOT DETECTED Final   Vibrio cholerae NOT DETECTED NOT DETECTED Final   Enteroaggregative E coli (EAEC) NOT DETECTED NOT DETECTED Final   Enteropathogenic E coli (EPEC) NOT DETECTED NOT DETECTED Final   Enterotoxigenic E coli (ETEC) NOT DETECTED NOT DETECTED Final   Shiga like toxin producing E coli (STEC) NOT DETECTED NOT DETECTED Final   Shigella/Enteroinvasive E coli (EIEC) NOT DETECTED NOT DETECTED Final   Cryptosporidium NOT DETECTED NOT DETECTED Final   Cyclospora cayetanensis NOT DETECTED NOT DETECTED Final   Entamoeba histolytica NOT DETECTED NOT DETECTED Final   Giardia lamblia NOT DETECTED  NOT DETECTED Final   Adenovirus F40/41 NOT DETECTED NOT DETECTED Final   Astrovirus NOT DETECTED NOT DETECTED Final   Norovirus GI/GII NOT DETECTED NOT DETECTED Final   Rotavirus A NOT DETECTED NOT DETECTED Final   Sapovirus (I, II, IV, and V) NOT DETECTED NOT DETECTED Final    Comment: Performed at [facility], [address] [place], [place] [address]  SARS Coronavirus 2 by RT PCR (hospital order, performed in [place] hospital lab) Nasopharyngeal Nasopharyngeal Swab     Status: None   Collection Time: 11/22/20 10:35 AM   Specimen: Nasopharyngeal Swab  Result Value Ref Range Status   SARS Coronavirus 2 NEGATIVE NEGATIVE Final    Comment: (NOTE) SARS-CoV-2 target nucleic acids are NOT DETECTED.  The SARS-CoV-2 RNA is generally detectable in upper and lower respiratory specimens during the acute phase of infection. The lowest concentration  of SARS-CoV-2 viral copies this assay can detect is 250 copies / mL. A negative result does not preclude SARS-CoV-2 infection and should not be used as the sole basis for treatment or other patient management decisions.  A negative result may occur with improper specimen collection / handling, submission of specimen other than nasopharyngeal swab, presence of viral mutation(s) within the areas targeted by this assay, and inadequate number of viral copies (<250 copies / mL). A negative result must be combined with clinical observations, patient history, and epidemiological information.  Fact Sheet for Patients:   StrictlyIdeas.no  Fact Sheet for Healthcare Providers: BankingDealers.co.za  This test is not yet approved or  cleared by the [place] FDA and has been authorized for detection and/or diagnosis of SARS-CoV-2 by FDA under an Emergency Use Authorization (EUA).  This EUA will remain in effect (meaning this test can be used) for the duration of the COVID-19 declaration under  Section 564(b)(1) of the Act, 21 U.S.C. section 360bbb-3(b)(1), unless the authorization is terminated or revoked sooner.  Performed at [facility], [address] [place], [place] [address]      Radiology Studies: No results found.   Scheduled Meds: . amiodarone  200 mg Oral Daily  . bismuth subsalicylate  30 mL Oral Q6H  . calcitRIOL  0.5 mcg Oral Daily  . Chlorhexidine Gluconate Cloth  6 each Topical Q0600  . Chlorhexidine Gluconate Cloth  6 each Topical Q0600  . COVID-19 mRNA vaccine (Pfizer)  0.3 mL Intramuscular ONCE-1600  . darbepoetin (ARANESP) injection - DIALYSIS  200 mcg Intravenous Q Wed-HD  . famotidine  20 mg Oral Daily  . feeding supplement  237 mL Oral BID BM  . finasteride  5 mg Oral Daily  . fludrocortisone  0.2 mg Oral Daily  . loperamide  2 mg Oral TID  . metoCLOPramide (REGLAN) injection  5 mg Intravenous Q8H  . metoprolol tartrate  12.5 mg Oral BID  . midodrine  10 mg Oral TID with meals  . multivitamin  1 tablet Oral QHS  . simvastatin  20 mg Oral QPM  . sodium chloride flush  3 mL Intravenous Q12H   Continuous Infusions: . sodium chloride    . sodium chloride    . sodium chloride    . albumin human    . ferric gluconate (FERRLECIT/NULECIT) IV 125 mg (11/23/20 1153)     LOS: 21 days   Time spent: 35 mins   Rosabell Geyer Wynetta Emery, MD How to contact the [facility] Attending or Consulting provider [place] or covering provider during after hours [place], for this patient?  1. Check the care team in [facility] and look for a) attending/consulting TRH provider listed and b) the [facility] team listed 2. Log into [email or web address] and use [place]'s universal password to access. If you do not have the password, please contact the hospital operator. 3. Locate the [facility] provider you are looking for under [facility] and page to a number that you can be directly reached. 4. If you still have difficulty reaching the provider, please page the [facility] (Director on Call) for the  Hospitalists listed on amion for assistance.  11/23/2020, 3:56 PM

## 2020-11-23 NOTE — Progress Notes (Signed)
Patient ID: [name], male   DOB: 1951/01/13, 69 y.o.   MRN: [id number] S: Still complaining of diarrhea and dizziness O:BP 130/74 (BP Location: Right Arm)   Pulse 70   Temp 97.8 F (36.6 C)   Resp 17   Ht 6\' 2"  (1.88 m)   Wt 68.3 kg   SpO2 99%   BMI 19.33 kg/m   Intake/Output Summary (Last 24 hours) at 11/23/2020 1026 Last data filed at 11/23/2020 0300 Gross per 24 hour  Intake 240 ml  Output --  Net 240 ml   Intake/Output: I/O last 3 completed shifts: In: 240 [P.O.:240] Out: 1525 [Urine:1525]  Intake/Output this shift:  No intake/output data recorded. Weight change:  Gen: NAD CVS: RRR Resp:CTA Abd: +BS, soft, NT/ND, binder in place  Ext: trace ankle edema  Recent Labs  Lab 11/17/20 1843 11/18/20 0458 11/19/20 0506 11/20/20 0539 11/21/20 0911 11/22/20 0616 11/23/20 0503  NA 130* 131* 128* 128* 131* 132* 130*  K 3.4* 3.1* 3.2* 3.3* 3.8 3.9 3.7  CL 99 97* 97* 99 102 102 98  CO2 21* 23 22 20* 19* 20* 20*  GLUCOSE 176* 122* 94 105* 92 133* 100*  BUN 51* 33* 41* 45* 43* 41* 40*  CREATININE 3.21* 2.08* 2.79* 3.15* 3.13* 3.34* 3.35*  ALBUMIN 1.9*  --  1.8* 1.8* 1.8* 1.9* 1.9*  CALCIUM 7.3* 7.2* 7.2* 7.2* 7.4* 7.4* 7.6*  PHOS 3.8  --  3.5 3.9 3.8  --  3.2   Liver Function Tests: Recent Labs  Lab 11/21/20 0911 11/22/20 0616 11/23/20 0503  ALBUMIN 1.8* 1.9* 1.9*   No results for input(s): LIPASE, AMYLASE in the last 168 hours. No results for input(s): AMMONIA in the last 168 hours. CBC: Recent Labs  Lab 11/19/20 0506 11/20/20 0539 11/21/20 0911 11/21/20 2112 11/22/20 0616 11/23/20 0503  WBC 7.3 7.3 6.0  --  5.9 7.0  HGB 7.7* 7.5* 7.9* 8.9* 9.0* 9.5*  HCT 24.9* 24.4* 25.5* 29.7* 29.7* 30.8*  MCV 93.6 94.9 95.1  --  94.6 95.4  PLT 177 192 213  --  233 254   Cardiac Enzymes: No results for input(s): CKTOTAL, CKMB, CKMBINDEX, TROPONINI in the last 168 hours. CBG: No results for input(s): GLUCAP in the last 168 hours.  Iron Studies: No results for  input(s): IRON, TIBC, TRANSFERRIN, FERRITIN in the last 72 hours. Studies/Results: No results found. Marland Kitchen amiodarone  200 mg Oral Daily  . bismuth subsalicylate  30 mL Oral Q6H  . calcitRIOL  0.5 mcg Oral Daily  . Chlorhexidine Gluconate Cloth  6 each Topical Q0600  . Chlorhexidine Gluconate Cloth  6 each Topical Q0600  . darbepoetin (ARANESP) injection - DIALYSIS  200 mcg Intravenous Q Wed-HD  . famotidine  20 mg Oral Daily  . feeding supplement  237 mL Oral BID BM  . finasteride  5 mg Oral Daily  . fludrocortisone  0.2 mg Oral Daily  . loperamide  2 mg Oral TID  . metoprolol tartrate  12.5 mg Oral BID  . midodrine  10 mg Oral TID with meals  . multivitamin  1 tablet Oral QHS  . simvastatin  20 mg Oral QPM  . sodium chloride flush  3 mL Intravenous Q12H    BMET    Component Value Date/Time   NA 130 (L) 11/23/2020 0503   K 3.7 11/23/2020 0503   CL 98 11/23/2020 0503   CO2 20 (L) 11/23/2020 0503   GLUCOSE 100 (H) 11/23/2020 0503   BUN 40 (  H) 11/23/2020 0503   CREATININE 3.35 (H) 11/23/2020 0503   CALCIUM 7.6 (L) 11/23/2020 0503   GFRNONAA 19 (L) 11/23/2020 0503   GFRAA 19 (L) 06/15/2018 0407   CBC    Component Value Date/Time   WBC 7.0 11/23/2020 0503   RBC 3.23 (L) 11/23/2020 0503   HGB 9.5 (L) 11/23/2020 0503   HCT 30.8 (L) 11/23/2020 0503   PLT 254 11/23/2020 0503   MCV 95.4 11/23/2020 0503   MCH 29.4 11/23/2020 0503   MCHC 30.8 11/23/2020 0503   RDW 19.3 (H) 11/23/2020 0503   LYMPHSABS 0.5 (L) 11/02/2020 1209   MONOABS 1.2 (H) 11/02/2020 1209   EOSABS 0.0 11/02/2020 1209   BASOSABS 0.1 11/02/2020 1209     Assessment/Plan:  1. RecurrentSyncope/hypotension; sig orthostasis:On TID Midodrine and florinef. HD with minimal/no UFdue to good UOP.Keeping net even on HD. 1. Pleasefollow orthostatics and ambulate with PT 2. Midodrine 10mg  TID 3. Maintain abd binder and compression stockings 4. florinef increased to 0.2 daily 12/20 2. Colitis- [name] been  following and he completed a course ofcipro/flagyl. He had colonoscopy on 10/17/20 -did noteblood in the rectum and sigmoid colon, in the descending colon and at the splenic flexure; colitis involving descending and sigmoid colons seen. Biopsies performed. Per GI and surgery. Has had extensive work up and interventions however diagnosis is still elusive. 5-HIAA levels WNL.  Colonoscopy 12/22 showed large sigmoid polyp and 2 from hepatic flexure.  Biopsies taken and pending.  3. Urinary retention- this is new. Pt with significant UOP.Improved withfoley catheter andmay need outpatientUrology evaluation. Unclear if this is drug related or due to BPH 1. Foley catheter out and will need to monitor bladder scans.  No uop documented thus far today and may need to replace foley. 4. [facility]. 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 1. No amyloid deposits reported. 2. Last HD Sat 12/18, has had adequate urine output with stable labs, monitor for now. Holding HD for now. No acute indications for renal replacement therapy at this time. 3. BUN/Cr stable and will continue to hold HD for now. 4. May be able to remove [name] next week if his renal function continues to remain stable and will need close follow up with Dr. Theador Hawthorne after discharge if he does not need ongoing dialysis. 5. Anemia:of CKD, possibly some GIB- s/p blood transfusion. No heparin with HD. GI on board, colonoscopyfindings as above. Rec 1u prbc 11/22 1. Transfuse as needed to keep Hgb >7 2. On Aranesp weekly- will inc dose- iron stores low on 12/10- will replete 6. CKD-MBD: calcitriol 0.41mcg daily 7. Severe protein malnutrition:per primary, push protein 8. Vascular access- will need to f/u with Dr.  Donnetta Hutching once stable for discharge. 9. A fib- on amiodarone and eliquis (off A/C as of right now) 10. Hyponatremia. Stable and asymptomatic.  Likely due to edema and meds 11. Disposition- pending ability to safely ambulate before discharge.   Donetta Potts, MD [name] [phone]

## 2020-11-23 NOTE — Progress Notes (Signed)
PT in to work with pt. Pt orthostatic with B/P down to 66 systolic in sitting position. Pt c/o feeling bad and nausea with the hypotension. MD Wynetta Emery notified and 10am Metoprolol held. Pt back to bed, states feels better lying down. Pt was able to take his oral meds once nausea subsided.

## 2020-11-23 NOTE — TOC Progression Note (Addendum)
Transition of Care [facility]) - Progression Note   Patient Details  Name: NOAM KARAFFA MRN: [id number] Date of Birth: [birth year]  Transition of Care [facility]) CM/SW [place], LCSW Phone Number: 11/23/2020, 1:11 PM  Clinical Narrative: CSW unable to reach admissions at [facility]. Patient will not be able to discharge to SNF today. TOC to follow.  Addendum: CSW spoke with nursing floor at [facility] and was informed patient's bed was given up.  Expected Discharge Plan: Skilled Nursing Facility Barriers to Discharge: Continued Medical Work up  Expected Discharge Plan and Services Expected Discharge Plan: [place]  Readmission Risk Interventions No flowsheet data found.

## 2020-11-23 NOTE — Progress Notes (Addendum)
Subjective:  Patient states his blood pressure plummeted as he sat at the edge of the bed.  He felt dizzy and lightheaded and feel like he was going to pass out.  He is fine when supine.  His appetite is not good.  He states his hiccups have not stopped.  He has had 2 stools today.  They are loose and small volume.  No melena or rectal bleeding reported.  He denies abdominal pain.  Current Medications:  Current Facility-Administered Medications:  .  0.9 %  sodium chloride infusion, 100 mL, Intravenous, PRN, Donato Heinz, MD .  0.9 %  sodium chloride infusion, 100 mL, Intravenous, PRN, Donato Heinz, MD .  0.9 %  sodium chloride infusion, 250 mL, Intravenous, PRN, Manuella Ghazi, Pratik D, DO .  acetaminophen (TYLENOL) tablet 650 mg, 650 mg, Oral, Q6H PRN, 650 mg at 11/23/20 0848 **OR** acetaminophen (TYLENOL) suppository 650 mg, 650 mg, Rectal, Q6H PRN, Manuella Ghazi, Pratik D, DO .  albumin human 25 % solution 50 g, 50 g, Intravenous, Daily, Raykwon Hobbs U, MD .  amiodarone (PACERONE) tablet 200 mg, 200 mg, Oral, Daily, Manuella Ghazi, Pratik D, DO, 200 mg at 11/23/20 1052 .  bismuth subsalicylate (PEPTO BISMOL) 262 MG/15ML suspension 30 mL, 30 mL, Oral, Q6H, Mahala Menghini, PA-C, 30 mL at 11/22/20 2256 .  calcitRIOL (ROCALTROL) capsule 0.5 mcg, 0.5 mcg, Oral, Daily, Donato Heinz, MD, 0.5 mcg at 11/23/20 1052 .  Chlorhexidine Gluconate Cloth 2 % PADS 6 each, 6 each, Topical, Q0600, Heath Lark D, DO, 6 each at 11/22/20 0524 .  Chlorhexidine Gluconate Cloth 2 % PADS 6 each, 6 each, Topical, Q0600, Corliss Parish, MD, 6 each at 11/21/20 [phone]  COVID-19 mRNA vaccine (Pfizer) injection 0.3 mL, 0.3 mL, Intramuscular, ONCE-1600, Johnson, Clanford L, MD .  Darbepoetin Alfa (ARANESP) injection 200 mcg, 200 mcg, Intravenous, Q Wed-HD, Corliss Parish, MD .  famotidine (PEPCID) tablet 20 mg, 20 mg, Oral, Daily, Johnson, Clanford L, MD, 20 mg at 11/23/20 1052 .  feeding supplement (ENSURE ENLIVE /  ENSURE PLUS) liquid 237 mL, 237 mL, Oral, BID BM, Shah, Pratik D, DO, 237 mL at 11/22/20 0944 .  ferric gluconate (NULECIT) 125 mg in sodium chloride 0.9 % 100 mL IVPB, 125 mg, Intravenous, Daily, Corliss Parish, MD, Last Rate: 110 mL/hr at 11/23/20 1153, 125 mg at 11/23/20 1153 .  finasteride (PROSCAR) tablet 5 mg, 5 mg, Oral, Daily, Ghimire, Henreitta Leber, MD, 5 mg at 11/23/20 1051 .  fludrocortisone (FLORINEF) tablet 0.2 mg, 0.2 mg, Oral, Daily, Corliss Parish, MD, 0.2 mg at 11/23/20 1052 .  heparin injection 1,400 Units, 20 Units/kg, Dialysis, PRN, Donato Heinz, MD, 1,400 Units at 11/17/20 1725 .  heparin injection 3,800 Units, 3,800 Units, Dialysis, PRN, Donato Heinz, MD, 3,800 Units at 11/17/20 2105 .  lidocaine (PF) (XYLOCAINE) 1 % injection 5 mL, 5 mL, Intradermal, PRN, Donato Heinz, MD .  lidocaine-prilocaine (EMLA) cream 1 application, 1 application, Topical, PRN, Donato Heinz, MD .  loperamide (IMODIUM) capsule 2 mg, 2 mg, Oral, TID, Mahala Menghini, PA-C, 2 mg at 11/23/20 1051 .  metoprolol tartrate (LOPRESSOR) tablet 12.5 mg, 12.5 mg, Oral, BID, Manuella Ghazi, Pratik D, DO, 12.5 mg at 11/22/20 2255 .  midodrine (PROAMATINE) tablet 10 mg, 10 mg, Oral, TID with meals, Johnson, Clanford L, MD, 10 mg at 11/23/20 1052 .  multivitamin (RENA-VIT) tablet 1 tablet, 1 tablet, Oral, QHS, Shah, Pratik D, DO, 1 tablet at 11/22/20 2256 .  ondansetron (ZOFRAN) tablet 4 mg, 4  mg, Oral, Q6H PRN **OR** ondansetron (ZOFRAN) injection 4 mg, 4 mg, Intravenous, Q6H PRN, Manuella Ghazi, Pratik D, DO, 4 mg at 11/22/20 1250 .  pentafluoroprop-tetrafluoroeth (GEBAUERS) aerosol 1 application, 1 application, Topical, PRN, Donato Heinz, MD .  simvastatin (ZOCOR) tablet 20 mg, 20 mg, Oral, QPM, Shah, Pratik D, DO, 20 mg at 11/22/20 1713 .  sodium chloride flush (NS) 0.9 % injection 3 mL, 3 mL, Intravenous, Q12H, Shah, Pratik D, DO, 3 mL at 11/23/20 1058 .  sodium chloride flush (NS) 0.9 % injection  3 mL, 3 mL, Intravenous, PRN, Manuella Ghazi, Pratik D, DO, 3 mL at 11/17/20 1127  Objective: Blood pressure 103/66, pulse 73, temperature 97.8 F (36.6 C), resp. rate 18, height 6' 2" (1.88 m), weight 68.3 kg, SpO2 99 %. Patient is alert and in no acute distress. He appears pale Abdomen is full.  Bowel sounds normal.  Percussion note across upper abdomen is very tympanic.  On palpation is soft and nontender with organomegaly or masses He had TED stockings in place  Labs/studies Results:  CBC Latest Ref Rng & Units 11/23/2020 11/22/2020 11/21/2020  WBC 4.0 - 10.5 K/uL 7.0 5.9 -  Hemoglobin 13.0 - 17.0 g/dL 9.5(L) 9.0(L) 8.9(L)  Hematocrit 39.0 - 52.0 % 30.8(L) 29.7(L) 29.7(L)  Platelets 150 - 400 K/uL 254 233 -    CMP Latest Ref Rng & Units 11/23/2020 11/22/2020 11/21/2020  Glucose 70 - 99 mg/dL 100(H) 133(H) 92  BUN 8 - 23 mg/dL 40(H) 41(H) 43(H)  Creatinine 0.61 - 1.24 mg/dL 3.35(H) 3.34(H) 3.13(H)  Sodium 135 - 145 mmol/L 130(L) 132(L) 131(L)  Potassium 3.5 - 5.1 mmol/L 3.7 3.9 3.8  Chloride 98 - 111 mmol/L 98 102 102  CO2 22 - 32 mmol/L 20(L) 20(L) 19(L)  Calcium 8.9 - 10.3 mg/dL 7.6(L) 7.4(L) 7.4(L)  Total Protein 6.5 - 8.1 g/dL - - -  Total Bilirubin 0.3 - 1.2 mg/dL - - -  Alkaline Phos 38 - 126 U/L - - -  AST 15 - 41 U/L - - -  ALT 0 - 44 U/L - - -    Hepatic Function Latest Ref Rng & Units 11/23/2020 11/22/2020 11/21/2020  Total Protein 6.5 - 8.1 g/dL - - -  Albumin 3.5 - 5.0 g/dL 1.9(L) 1.9(L) 1.8(L)  AST 15 - 41 U/L - - -  ALT 0 - 44 U/L - - -  Alk Phosphatase 38 - 126 U/L - - -  Total Bilirubin 0.3 - 1.2 mg/dL - - -  Bilirubin, Direct 0.0 - 0.2 mg/dL - - -   Colonic biopsies are pending.  Called Pathology but no answer.  Repeat GI pathogen panel from 11/21/2020 -.  Assessment:  #1.  Diarrhea.  Patient has undergone extensive work-up.  Stool studies been negative.  Colonoscopy on 10/17/2020 was limited to sigmoidoscopy because of very poor prep.  He did have patchy  ulceration and biopsy revealed colitis.  He has not responded to empiric therapy with antibiotics as well as steroids and symptomatic therapy with cholestyramine and antidiarrheals.  He underwent full colonoscopy 2 days ago revealing 3 polyps in proximal colon.  TI was normal.  He had circumferential ulcer evolving distal sigmoid colon.  This ulcer endoscopically appeared to be healing and most likely due to ischemia.  Biopsies are pending.  Pathogen panel was repeated and remains negative.  His colon was somewhat dilated and he may also have an element of megacolon resulting from prolonged illness and bedrest.  Will continue symptomatic therapy.  #  2.  Anemia appears to be multifactorial.  He did receive a unit of PRBCs prior to his colonoscopy 2 days ago.  No evidence of overt GI bleed.  Hemoglobin stable over the last 24 hours.  #3.  Chronic kidney disease.  He is being followed by nephrology.  He has not required hemodialysis since 11/17/2020.  Prior renal biopsy revealed severe arterial nephrosclerosis.  #4.  Orthostatic hypotension.  He has not responded to midodrine, mineralocorticoid, TED stockings and abdominal binder.  Remains with profound hypoalbuminemia.  It remains to be seen if correcting this would help.  #5.  Urinary retention requiring placement of Foley's catheter.  #6.  Hiccups.  I wonder if he has gastroparesis.  We will treat him with IV metoclopramide for 4 doses and see if he gets any better   Recommendations  Albumin 50 g IV daily x3 doses. Send stool sample for fecal elastase. Repeat cortisol level in a.m(it was normal on 10/12/2020 ) metoclopramide 5 mg IV x4 doses per Follow-up on colon biopsy results

## 2020-11-23 NOTE — Progress Notes (Signed)
Physical Therapy Treatment Patient Details Name: [name] MRN: [id number] DOB: October 13, 1951 Today's Date: 11/23/2020    History of Present Illness [name] is a 69 y.o. male with medical history significant for type 2 diabetes, CKD stage V recently initiated on hemodialysis MWF, atrial fibrillation/flutter on amiodarone and metoprolol and Eliquis, recurrent orthostatic hypotension, dyslipidemia, and recent discharge on 12/2 for colitis with associated adynamic ileus and acute blood loss anemia who presented back to the ED today after he was noted to have an episode of syncope while getting up to go to hemodialysis from his SNF.  On arrival to the ED his systolic blood pressures were in the 70 mmHg range.  He is noted to have some diarrhea, but he has recently completed course of ciprofloxacin and Flagyl by 11/23.  He was noted to have nonspecific colitis on pathology after recent colonoscopy on 11/17 and was started on prednisone taper which he continues to take at this time.    PT Comments    Patient tolerated sitting up at bedside for approximately 15 minutes while completing BLE ROM/strengthening exercises, unable to attempt sit to stands due to BP dropping and c/o nausea/dizziness.  Patient's orthostatic BP's as follows: lying 111/73, initial sitting + 3 minutes 75/60, and after sitting for 8-10 minutes 66/55.  Patient put back to bed after therapy - RN aware.  Patient will benefit from continued physical therapy in hospital and recommended venue below to increase strength, balance, endurance for safe ADLs and gait.    Follow Up Recommendations  SNF     Equipment Recommendations  Rolling walker with 5" wheels    Recommendations for Other Services       Precautions / Restrictions Precautions Precautions: Fall Precaution Comments: monitor BP Restrictions Weight Bearing Restrictions: No    Mobility  Bed Mobility Overal bed mobility: Needs Assistance Bed Mobility: Supine to  Sit;Sit to Supine     Supine to sit: Min guard Sit to supine: Min guard   General bed mobility comments: increased time, labored movement  Transfers                    Ambulation/Gait                 Stairs             Wheelchair Mobility    Modified Rankin (Stroke Patients Only)       Balance Overall balance assessment: Needs assistance Sitting-balance support: Feet supported;No upper extremity supported Sitting balance-Leahy Scale: Fair Sitting balance - Comments: fair/good seated at bedside                                    Cognition Arousal/Alertness: Awake/alert Behavior During Therapy: WFL for tasks assessed/performed Overall Cognitive Status: Within Functional Limits for tasks assessed                                        Exercises General Exercises - Lower Extremity Long Arc Quad: Seated;AROM;Strengthening;Both;10 reps Toe Raises: Seated;AROM;Strengthening;Left;15 reps Heel Raises: Seated;AROM;Strengthening;Both;15 reps    General Comments        Pertinent Vitals/Pain Pain Assessment: No/denies pain    Home Living                      Prior  Function            PT Goals (current goals can now be found in the care plan section) Acute Rehab PT Goals Patient Stated Goal: return home after rehab Time For Goal Achievement: 11/29/20 Potential to Achieve Goals: Good Progress towards PT goals: Progressing toward goals    Frequency    Min 3X/week      PT Plan Current plan remains appropriate    Co-evaluation              AM-PAC PT "6 Clicks" Mobility   Outcome Measure  Help needed turning from your back to your side while in a flat bed without using bedrails?: None Help needed moving from lying on your back to sitting on the side of a flat bed without using bedrails?: A Little Help needed moving to and from a bed to a chair (including a wheelchair)?: A Lot Help needed  standing up from a chair using your arms (e.g., wheelchair or bedside chair)?: A Lot Help needed to walk in hospital room?: A Lot Help needed climbing 3-5 steps with a railing? : Total 6 Click Score: 14    End of Session   Activity Tolerance: Patient tolerated treatment well;Patient limited by fatigue Patient left: in bed;with call bell/phone within reach;with bed alarm set Nurse Communication: Mobility status PT Visit Diagnosis: Unsteadiness on feet (R26.81);Other abnormalities of gait and mobility (R26.89);Muscle weakness (generalized) (M62.81)     Time: 7322-0254 PT Time Calculation (min) (ACUTE ONLY): 20 min  Charges:  $Therapeutic Exercise: 8-22 mins $Therapeutic Activity: 8-22 mins                     12:05 PM, 11/23/20 Lonell Grandchild, MPT Physical Therapist with [facility] [phone] office [phone] mobile phone

## 2020-11-24 DIAGNOSIS — R197 Diarrhea, unspecified: Secondary | ICD-10-CM | POA: Diagnosis not present

## 2020-11-24 DIAGNOSIS — D123 Benign neoplasm of transverse colon: Secondary | ICD-10-CM | POA: Diagnosis not present

## 2020-11-24 DIAGNOSIS — K529 Noninfective gastroenteritis and colitis, unspecified: Secondary | ICD-10-CM | POA: Diagnosis not present

## 2020-11-24 DIAGNOSIS — D122 Benign neoplasm of ascending colon: Secondary | ICD-10-CM | POA: Diagnosis not present

## 2020-11-24 LAB — RENAL FUNCTION PANEL
Albumin: 2.3 g/dL — ABNORMAL LOW (ref 3.5–5.0)
Anion gap: 11 (ref 5–15)
BUN: 50 mg/dL — ABNORMAL HIGH (ref 8–23)
CO2: 18 mmol/L — ABNORMAL LOW (ref 22–32)
Calcium: 7.7 mg/dL — ABNORMAL LOW (ref 8.9–10.3)
Chloride: 100 mmol/L (ref 98–111)
Creatinine, Ser: 4.12 mg/dL — ABNORMAL HIGH (ref 0.61–1.24)
GFR, Estimated: 15 mL/min — ABNORMAL LOW (ref >60)
Glucose, Bld: 136 mg/dL — ABNORMAL HIGH (ref 70–99)
Phosphorus: 5 mg/dL — ABNORMAL HIGH (ref 2.5–4.6)
Potassium: 3.8 mmol/L (ref 3.5–5.1)
Sodium: 129 mmol/L — ABNORMAL LOW (ref 135–145)

## 2020-11-24 LAB — CORTISOL-AM, BLOOD: Cortisol - AM: 23.1 ug/dL — ABNORMAL HIGH (ref 6.7–22.6)

## 2020-11-24 NOTE — Progress Notes (Signed)
PROGRESS NOTE   [name]  UMP:[id number] DOB: 10/20/1951 DOA: 11/02/2020 PCP: [name], No Pcp Per   Chief Complaint  [name] presents with  . Loss of Consciousness    Brief Admission History:  69 y.o. male ESRD on HD MWF, DM-2, atrial fibrillation, HLD-recent hospitalization for colitis/diarrhea-presented to the hospital for evaluation of syncope due to orthostatic hypotension.  Hospital course complicated by ongoing diarrhea and persistent orthostatic hypotension. Assessment & Plan:   Active Problems:   Syncope due to orthostatic hypotension   ESRD on hemodialysis (HCC)   Colitis   Orthostatic Syncope - Pt has been improving after starting midodrine, florinef, LE stockings and abdominal binder.  Continue working with PT.   Adding IV albumin per Dr. Laural Golden 12/24 to increase oncotic pressures.   Chronic diarrhea -improving, rectal tube has been removed, diarrhea has been improving.  S/p colonoscopy 12/22.  Awaiting biopsy results.   ESRD on HD -  HD on hold for now per nephrology service.  Temporarily holding HD given good urine output.  Following lytes closely.   PAF - stable on amiodarone/metoprolol.  Apixaban was held by cardiology due to recent rectal bleeding and acute blood loss anemia.  Anticoagulation to be addressed by outpatient cardiology follow up.   Anemia in CKD - stable, following.   HLD - resume home statin therapy.   Acute urinary retention - s/p foley placement 12/16.  Foley removed 12/23 and he has been voiding.  Unfortunately he developed acute urinary retention again on 12/25 and foley was replaced with >900 mL urine relieved.  Will leave foley in place for now until he can follow up outpatient with urology.    DVT prophylaxis:  Full  Code Status: full  Family Communication: Pt prefers to convey to family healthcare info Disposition:  SNF when medically cleared by GI service  Status is: Inpatient  Remains inpatient appropriate because:Inpatient level  of care appropriate due to severity of illness. Plan to DC to SNF tomorrow if ok with GI and remains stable.   Dispo:  [name] From: [place]  Planned Disposition: [facility]  Expected discharge date: 11/26/2020  Medically stable for discharge: No  Consultants:   GI   Nephrology  cardiology  Procedures:  11/17>> colon biopsy: Severely active chronic nonspecific colitis with ulceration-negative for granulomas or dysplasia.  Stains for CMV negative.  Antimicrobials:  Ciprofloxacin: 11/13>> 11/22 Flagyl: 11/13>> 11/22   Subjective: Pt c/o distended bladder, bladder pain and discomfort.    Objective: Vitals:   11/23/20 1157 11/23/20 1500 11/23/20 2144 11/24/20 0543  BP: 103/66 126/75 (!) 185/100 112/65  Pulse: 73 72 (!) 101 73  Resp:   18 18  Temp:  98.2 F (36.8 C) 98.1 F (36.7 C) 97.6 F (36.4 C)  TempSrc:  Oral Oral Oral  SpO2: 99% 99% 100% 100%  Weight:    66.2 kg  Height:        Intake/Output Summary (Last 24 hours) at 11/24/2020 0908 Last data filed at 11/24/2020 0844 Gross per 24 hour  Intake 600 ml  Output 1000 ml  Net -400 ml   Filed Weights   11/20/20 0602 11/21/20 0600 11/24/20 0543  Weight: 67.8 kg 68.3 kg 66.2 kg    Examination:  General exam: chronically ill appearing male, Appears calm and comfortable  Respiratory system: Clear to auscultation. Respiratory effort normal. Cardiovascular system: S1 & S2 heard. No JVD, murmurs, rubs, gallops or clicks. No pedal edema. Gastrointestinal system: Abdomen is nondistended, soft  and nontender. No organomegaly or masses felt. Normal bowel sounds heard. Central nervous system: Alert and oriented. No focal neurological deficits. Extremities: Symmetric 5 x 5 power. Skin: No rashes, lesions or ulcers Psychiatry: Judgement and insight appear normal. Mood & affect appropriate.   Data Reviewed: I have personally reviewed following labs and imaging studies  CBC: Recent Labs   Lab 11/19/20 0506 11/20/20 0539 11/21/20 0911 11/21/20 2112 11/22/20 0616 11/23/20 0503  WBC 7.3 7.3 6.0  --  5.9 7.0  HGB 7.7* 7.5* 7.9* 8.9* 9.0* 9.5*  HCT 24.9* 24.4* 25.5* 29.7* 29.7* 30.8*  MCV 93.6 94.9 95.1  --  94.6 95.4  PLT 177 192 213  --  233 915    Basic Metabolic Panel: Recent Labs  Lab 11/18/20 0458 11/19/20 0506 11/20/20 0539 11/21/20 0911 11/22/20 0616 11/23/20 0503 11/24/20 0721  NA 131* 128* 128* 131* 132* 130* 129*  K 3.1* 3.2* 3.3* 3.8 3.9 3.7 3.8  CL 97* 97* 99 102 102 98 100  CO2 23 22 20* 19* 20* 20* 18*  GLUCOSE 122* 94 105* 92 133* 100* 136*  BUN 33* 41* 45* 43* 41* 40* 50*  CREATININE 2.08* 2.79* 3.15* 3.13* 3.34* 3.35* 4.12*  CALCIUM 7.2* 7.2* 7.2* 7.4* 7.4* 7.6* 7.7*  MG 1.5* 1.8 1.9  --   --   --   --   PHOS  --  3.5 3.9 3.8  --  3.2 5.0*    GFR: Estimated Creatinine Clearance: 15.6 mL/min (A) (by C-G formula based on SCr of 4.12 mg/dL (H)).  Liver Function Tests: Recent Labs  Lab 11/20/20 0539 11/21/20 0911 11/22/20 0616 11/23/20 0503 11/24/20 0721  ALBUMIN 1.8* 1.8* 1.9* 1.9* 2.3*    CBG: No results for input(s): GLUCAP in the last 168 hours.  Recent Results (from the past 240 hour(s))  Gastrointestinal Panel by PCR , Stool     Status: None   Collection Time: 11/21/20  3:46 PM   Specimen: Stool  Result Value Ref Range Status   Campylobacter species NOT DETECTED NOT DETECTED Final   Plesimonas shigelloides NOT DETECTED NOT DETECTED Final   Salmonella species NOT DETECTED NOT DETECTED Final   Yersinia enterocolitica NOT DETECTED NOT DETECTED Final   Vibrio species NOT DETECTED NOT DETECTED Final   Vibrio cholerae NOT DETECTED NOT DETECTED Final   Enteroaggregative E coli (EAEC) NOT DETECTED NOT DETECTED Final   Enteropathogenic E coli (EPEC) NOT DETECTED NOT DETECTED Final   Enterotoxigenic E coli (ETEC) NOT DETECTED NOT DETECTED Final   Shiga like toxin producing E coli (STEC) NOT DETECTED NOT DETECTED Final    Shigella/Enteroinvasive E coli (EIEC) NOT DETECTED NOT DETECTED Final   Cryptosporidium NOT DETECTED NOT DETECTED Final   Cyclospora cayetanensis NOT DETECTED NOT DETECTED Final   Entamoeba histolytica NOT DETECTED NOT DETECTED Final   Giardia lamblia NOT DETECTED NOT DETECTED Final   Adenovirus F40/41 NOT DETECTED NOT DETECTED Final   Astrovirus NOT DETECTED NOT DETECTED Final   Norovirus GI/GII NOT DETECTED NOT DETECTED Final   Rotavirus A NOT DETECTED NOT DETECTED Final   Sapovirus (I, II, IV, and V) NOT DETECTED NOT DETECTED Final    Comment: Performed at [facility], [address] [place], [place] [address]  SARS Coronavirus 2 by RT PCR (hospital order, performed in [facility] hospital lab) Nasopharyngeal Nasopharyngeal Swab     Status: None   Collection Time: 11/22/20 10:35 AM   Specimen: Nasopharyngeal Swab  Result Value Ref Range Status  SARS Coronavirus 2 NEGATIVE NEGATIVE Final    Comment: (NOTE) SARS-CoV-2 target nucleic acids are NOT DETECTED.  The SARS-CoV-2 RNA is generally detectable in upper and lower respiratory specimens during the acute phase of infection. The lowest concentration of SARS-CoV-2 viral copies this assay can detect is 250 copies / mL. A negative result does not preclude SARS-CoV-2 infection and should not be used as the sole basis for treatment or other [name] management decisions.  A negative result may occur with improper specimen collection / handling, submission of specimen other than nasopharyngeal swab, presence of viral mutation(s) within the areas targeted by this assay, and inadequate number of viral copies (<250 copies / mL). A negative result must be combined with clinical observations, [name] history, and epidemiological information.  Fact Sheet for Patients:   StrictlyIdeas.no  Fact Sheet for Healthcare Providers: BankingDealers.co.za  This test is not yet approved or   cleared by the [place] FDA and has been authorized for detection and/or diagnosis of SARS-CoV-2 by FDA under an Emergency Use Authorization (EUA).  This EUA will remain in effect (meaning this test can be used) for the duration of the COVID-19 declaration under Section 564(b)(1) of the Act, 21 U.S.C. section 360bbb-3(b)(1), unless the authorization is terminated or revoked sooner.  Performed at [facility], [address], [place], [place] [address]      Radiology Studies: No results found.   Scheduled Meds: . amiodarone  200 mg Oral Daily  . bismuth subsalicylate  30 mL Oral Q6H  . calcitRIOL  0.5 mcg Oral Daily  . Chlorhexidine Gluconate Cloth  6 each Topical Q0600  . Chlorhexidine Gluconate Cloth  6 each Topical Q0600  . darbepoetin (ARANESP) injection - DIALYSIS  200 mcg Intravenous Q Wed-HD  . famotidine  20 mg Oral Daily  . feeding supplement  237 mL Oral BID BM  . finasteride  5 mg Oral Daily  . fludrocortisone  0.2 mg Oral Daily  . loperamide  2 mg Oral TID  . metoprolol tartrate  12.5 mg Oral BID  . midodrine  10 mg Oral TID with meals  . multivitamin  1 tablet Oral QHS  . simvastatin  20 mg Oral QPM  . sodium chloride flush  3 mL Intravenous Q12H   Continuous Infusions: . sodium chloride    . sodium chloride    . sodium chloride    . albumin human 50 g (11/23/20 1559)  . ferric gluconate (FERRLECIT/NULECIT) IV 125 mg (11/23/20 1153)    LOS: 22 days   Time spent: 35 mins   Joslin Doell Wynetta Emery, MD How to contact the [facility] Attending or Consulting provider [place] or covering provider during after hours [place], for this [name]?  1. Check the care team in [facility] and look for a) attending/consulting [facility] provider listed and b) the [facility] team listed 2. Log into [email or web address] and use [facility]'s universal password to access. If you do not have the password, please contact the hospital operator. 3. Locate the [facility] provider you are looking for under [facility]  and page to a number that you can be directly reached. 4. If you still have difficulty reaching the provider, please page the [facility] (Director on Call) for the Hospitalists listed on amion for assistance.  11/24/2020, 9:08 AM

## 2020-11-24 NOTE — Plan of Care (Signed)

## 2020-11-24 NOTE — Progress Notes (Signed)
[facility] ROUNDING NOTE   Subjective:   Brief history: This is a 69 year old male with history end-stage renal disease Monday Wednesday Friday diabetes mellitus type 2 atrial fibrillation and history of colitis.  He was admitted 11/02/2020 for an evaluation of syncope and orthostatic hypotension.  Has been treated with midodrine.  He is a relatively recent start to dialysis and his last dialysis was 11/17/2020 he has had adequate urine output and we are holding his dialysis for now with no acute indications for renal replacement therapy.  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  There were no amyloid deposits.  Blood pressure 112/65 pulse 93 temperature 97.5 O2 sats 90% room air  Urine output none recorded 11/23/2020 1 L right 11/24/2020 weight 66.2 kg  Sodium 129 potassium 3.8 chloride 100 CO2 18 BUN 50 creatinine 4.12 glucose 136 calcium 7.7 phosphorus 5 hemoglobin 9.5   Objective:  Vital signs in last 24 hours:  Temp:  [97.6 F (36.4 C)-98.2 F (36.8 C)] 97.6 F (36.4 C) (12/25 0543) Pulse Rate:  [72-101] 73 (12/25 0543) Resp:  [18] 18 (12/25 0543) BP: (103-185)/(65-100) 112/65 (12/25 0543) SpO2:  [99 %-100 %] 100 % (12/25 0543) Weight:  [66.2 kg] 66.2 kg (12/25 0543)  Weight change:  Filed Weights   11/20/20 0602 11/21/20 0600 11/24/20 0543  Weight: 67.8 kg 68.3 kg 66.2 kg    Intake/Output: I/O last 3 completed shifts: In: 1080 [P.O.:1080] Out: -    Intake/Output this shift:  Total I/O In: -  Out: 1000 [Urine:1000]  CVS- RRR RS-clear to auscultation ABD- BS present soft non-distended EXT-trace lower extremity edema   Basic Metabolic Panel: Recent Labs  Lab 11/18/20 0458 11/19/20 0506 11/20/20 0539 11/21/20 0911 11/22/20 0616  11/23/20 0503 11/24/20 0721  NA 131* 128* 128* 131* 132* 130* 129*  K 3.1* 3.2* 3.3* 3.8 3.9 3.7 3.8  CL 97* 97* 99 102 102 98 100  CO2 23 22 20* 19* 20* 20* 18*  GLUCOSE 122* 94 105* 92 133* 100* 136*  BUN 33* 41* 45* 43* 41* 40* 50*  CREATININE 2.08* 2.79* 3.15* 3.13* 3.34* 3.35* 4.12*  CALCIUM 7.2* 7.2* 7.2* 7.4* 7.4* 7.6* 7.7*  MG 1.5* 1.8 1.9  --   --   --   --   PHOS  --  3.5 3.9 3.8  --  3.2 5.0*    Liver Function Tests: Recent Labs  Lab 11/20/20 0539 11/21/20 0911 11/22/20 0616 11/23/20 0503 11/24/20 0721  ALBUMIN 1.8* 1.8* 1.9* 1.9* 2.3*   No results for input(s): LIPASE, AMYLASE in the last 168 hours. No results for input(s): AMMONIA in the last 168 hours.  CBC: Recent Labs  Lab 11/19/20 0506 11/20/20 0539 11/21/20 0911 11/21/20 2112 11/22/20 0616 11/23/20 0503  WBC 7.3 7.3 6.0  --  5.9 7.0  HGB 7.7* 7.5* 7.9* 8.9* 9.0* 9.5*  HCT 24.9* 24.4* 25.5* 29.7* 29.7* 30.8*  MCV 93.6 94.9 95.1  --  94.6 95.4  PLT 177 192 213  --  233 254    Cardiac Enzymes: No results for input(s): CKTOTAL, CKMB, CKMBINDEX, TROPONINI in the last 168 hours.  BNP: Invalid input(s): POCBNP  CBG: No results for input(s): GLUCAP in the last 168 hours.  Microbiology: Results for orders placed or performed during the hospital encounter of 11/02/20  MRSA PCR Screening  Status: None   Collection Time: 11/02/20  2:48 PM   Specimen: Nasopharyngeal  Result Value Ref Range Status   MRSA by PCR NEGATIVE NEGATIVE Final    Comment:        The GeneXpert MRSA Assay (FDA approved for NASAL specimens only), is one component of a comprehensive MRSA colonization surveillance program. It is not intended to diagnose MRSA infection nor to guide or monitor treatment for MRSA infections. Performed at [facility], [address], [place], [place] [address]   C Difficile Quick Screen w PCR reflex     Status: None   Collection Time: 11/02/20  3:20 PM   Specimen: STOOL  Result Value  Ref Range Status   C Diff antigen NEGATIVE NEGATIVE Final   C Diff toxin NEGATIVE NEGATIVE Final   C Diff interpretation No C. difficile detected.  Final    Comment: Performed at [facility], [address], [place], [place] [address]  Resp Panel by RT-PCR (Flu A&B, Covid) Nasopharyngeal Swab     Status: None   Collection Time: 11/05/20 10:41 AM   Specimen: Nasopharyngeal Swab; Nasopharyngeal(NP) swabs in vial transport medium  Result Value Ref Range Status   SARS Coronavirus 2 by RT PCR NEGATIVE NEGATIVE Final    Comment: (NOTE) SARS-CoV-2 target nucleic acids are NOT DETECTED.  The SARS-CoV-2 RNA is generally detectable in upper respiratory specimens during the acute phase of infection. The lowest concentration of SARS-CoV-2 viral copies this assay can detect is 138 copies/mL. A negative result does not preclude SARS-Cov-2 infection and should not be used as the sole basis for treatment or other patient management decisions. A negative result may occur with  improper specimen collection/handling, submission of specimen other than nasopharyngeal swab, presence of viral mutation(s) within the areas targeted by this assay, and inadequate number of viral copies(<138 copies/mL). A negative result must be combined with clinical observations, patient history, and epidemiological information. The expected result is Negative.  Fact Sheet for Patients:  EntrepreneurPulse.com.au  Fact Sheet for Healthcare Providers:  IncredibleEmployment.be  This test is no t yet approved or cleared by the [place] FDA and  has been authorized for detection and/or diagnosis of SARS-CoV-2 by FDA under an Emergency Use Authorization (EUA). This EUA will remain  in effect (meaning this test can be used) for the duration of the COVID-19 declaration under Section 564(b)(1) of the Act, 21 U.S.C.section 360bbb-3(b)(1), unless the authorization is terminated  or revoked  sooner.       Influenza A by PCR NEGATIVE NEGATIVE Final   Influenza B by PCR NEGATIVE NEGATIVE Final    Comment: (NOTE) The Xpert Xpress SARS-CoV-2/FLU/RSV plus assay is intended as an aid in the diagnosis of influenza from Nasopharyngeal swab specimens and should not be used as a sole basis for treatment. Nasal washings and aspirates are unacceptable for Xpert Xpress SARS-CoV-2/FLU/RSV testing.  Fact Sheet for Patients: EntrepreneurPulse.com.au  Fact Sheet for Healthcare Providers: IncredibleEmployment.be  This test is not yet approved or cleared by the [place] FDA and has been authorized for detection and/or diagnosis of SARS-CoV-2 by FDA under an Emergency Use Authorization (EUA). This EUA will remain in effect (meaning this test can be used) for the duration of the COVID-19 declaration under Section 564(b)(1) of the Act, 21 U.S.C. section 360bbb-3(b)(1), unless the authorization is terminated or revoked.  Performed at [facility], [address] [place],  [address]   OVA + PARASITE EXAM     Status: None   Collection  Time: 11/05/20  3:44 PM   Specimen: Per Rectum; Stool  Result Value Ref Range Status   OVA + PARASITE EXAM Final report  Final    Comment: (NOTE) These results were obtained using wet preparation(s) and trichrome stained smear. This test does not include testing for Cryptosporidium parvum, Cyclospora, or Microsporidia. Performed At: [place], [place] [id number] Truddie Coco MD RC:[id number]    Source of Sample STOOL  Final    Comment: Performed at [facility], [address] [place], [place] [address]  Gastrointestinal Panel by PCR , Stool     Status: None   Collection Time: 11/06/20  1:25 PM   Specimen: Stool  Result Value Ref Range Status   Campylobacter species NOT DETECTED NOT DETECTED Final   Plesimonas shigelloides NOT DETECTED NOT DETECTED Final    Salmonella species NOT DETECTED NOT DETECTED Final   Yersinia enterocolitica NOT DETECTED NOT DETECTED Final   Vibrio species NOT DETECTED NOT DETECTED Final   Vibrio cholerae NOT DETECTED NOT DETECTED Final   Enteroaggregative E coli (EAEC) NOT DETECTED NOT DETECTED Final   Enteropathogenic E coli (EPEC) NOT DETECTED NOT DETECTED Final   Enterotoxigenic E coli (ETEC) NOT DETECTED NOT DETECTED Final   Shiga like toxin producing E coli (STEC) NOT DETECTED NOT DETECTED Final   Shigella/Enteroinvasive E coli (EIEC) NOT DETECTED NOT DETECTED Final   Cryptosporidium NOT DETECTED NOT DETECTED Final   Cyclospora cayetanensis NOT DETECTED NOT DETECTED Final   Entamoeba histolytica NOT DETECTED NOT DETECTED Final   Giardia lamblia NOT DETECTED NOT DETECTED Final   Adenovirus F40/41 NOT DETECTED NOT DETECTED Final   Astrovirus NOT DETECTED NOT DETECTED Final   Norovirus GI/GII NOT DETECTED NOT DETECTED Final   Rotavirus A NOT DETECTED NOT DETECTED Final   Sapovirus (I, II, IV, and V) NOT DETECTED NOT DETECTED Final    Comment: Performed at [facility], [address] [place], [place] [address]  SARS Coronavirus 2 by RT PCR (hospital order, performed in [place] hospital lab) Nasopharyngeal Nasopharyngeal Swab     Status: None   Collection Time: 11/11/20 10:49 AM   Specimen: Nasopharyngeal Swab  Result Value Ref Range Status   SARS Coronavirus 2 NEGATIVE NEGATIVE Final    Comment: Performed at [facility], [address] [place], [place] [address]  Gastrointestinal Panel by PCR , Stool     Status: None   Collection Time: 11/21/20  3:46 PM   Specimen: Stool  Result Value Ref Range Status   Campylobacter species NOT DETECTED NOT DETECTED Final   Plesimonas shigelloides NOT DETECTED NOT DETECTED Final   Salmonella species NOT DETECTED NOT DETECTED Final   Yersinia enterocolitica NOT DETECTED NOT DETECTED Final   Vibrio species NOT DETECTED NOT DETECTED Final   Vibrio cholerae NOT  DETECTED NOT DETECTED Final   Enteroaggregative E coli (EAEC) NOT DETECTED NOT DETECTED Final   Enteropathogenic E coli (EPEC) NOT DETECTED NOT DETECTED Final   Enterotoxigenic E coli (ETEC) NOT DETECTED NOT DETECTED Final   Shiga like toxin producing E coli (STEC) NOT DETECTED NOT DETECTED Final   Shigella/Enteroinvasive E coli (EIEC) NOT DETECTED NOT DETECTED Final   Cryptosporidium NOT DETECTED NOT DETECTED Final   Cyclospora cayetanensis NOT DETECTED NOT DETECTED Final   Entamoeba histolytica NOT DETECTED NOT DETECTED Final   Giardia lamblia NOT DETECTED NOT DETECTED Final   Adenovirus F40/41 NOT DETECTED NOT DETECTED Final   Astrovirus NOT DETECTED NOT DETECTED Final  Norovirus GI/GII NOT DETECTED NOT DETECTED Final   Rotavirus A NOT DETECTED NOT DETECTED Final   Sapovirus (I, II, IV, and V) NOT DETECTED NOT DETECTED Final    Comment: Performed at [facility], [address] [place], [place] [address]  SARS Coronavirus 2 by RT PCR (hospital order, performed in [facility] hospital lab) Nasopharyngeal Nasopharyngeal Swab     Status: None   Collection Time: 11/22/20 10:35 AM   Specimen: Nasopharyngeal Swab  Result Value Ref Range Status   SARS Coronavirus 2 NEGATIVE NEGATIVE Final    Comment: (NOTE) SARS-CoV-2 target nucleic acids are NOT DETECTED.  The SARS-CoV-2 RNA is generally detectable in upper and lower respiratory specimens during the acute phase of infection. The lowest concentration of SARS-CoV-2 viral copies this assay can detect is 250 copies / mL. A negative result does not preclude SARS-CoV-2 infection and should not be used as the sole basis for treatment or other patient management decisions.  A negative result may occur with improper specimen collection / handling, submission of specimen other than nasopharyngeal swab, presence of viral mutation(s) within the areas targeted by this assay, and inadequate number of viral copies (<250 copies / mL). A  negative result must be combined with clinical observations, patient history, and epidemiological information.  Fact Sheet for Patients:   StrictlyIdeas.no  Fact Sheet for Healthcare Providers: BankingDealers.co.za  This test is not yet approved or  cleared by the [place] FDA and has been authorized for detection and/or diagnosis of SARS-CoV-2 by FDA under an Emergency Use Authorization (EUA).  This EUA will remain in effect (meaning this test can be used) for the duration of the COVID-19 declaration under Section 564(b)(1) of the Act, 21 U.S.C. section 360bbb-3(b)(1), unless the authorization is terminated or revoked sooner.  Performed at [facility], [address] [place], [place] [address]     Coagulation Studies: No results for input(s): LABPROT, INR in the last 72 hours.  Urinalysis: No results for input(s): COLORURINE, LABSPEC, PHURINE, GLUCOSEU, HGBUR, BILIRUBINUR, KETONESUR, PROTEINUR, UROBILINOGEN, NITRITE, LEUKOCYTESUR in the last 72 hours.  Invalid input(s): APPERANCEUR    Imaging: No results found.   Medications:   . sodium chloride    . sodium chloride    . sodium chloride    . albumin human 50 g (11/23/20 1559)  . ferric gluconate (FERRLECIT/NULECIT) IV 125 mg (11/23/20 1153)   . amiodarone  200 mg Oral Daily  . bismuth subsalicylate  30 mL Oral Q6H  . calcitRIOL  0.5 mcg Oral Daily  . Chlorhexidine Gluconate Cloth  6 each Topical Q0600  . Chlorhexidine Gluconate Cloth  6 each Topical Q0600  . darbepoetin (ARANESP) injection - DIALYSIS  200 mcg Intravenous Q Wed-HD  . famotidine  20 mg Oral Daily  . feeding supplement  237 mL Oral BID BM  . finasteride  5 mg Oral Daily  . fludrocortisone  0.2 mg Oral Daily  . loperamide  2 mg Oral TID  . metoprolol tartrate  12.5 mg Oral BID  . midodrine  10 mg Oral TID with meals  . multivitamin  1 tablet Oral QHS  . simvastatin  20 mg Oral QPM  . sodium  chloride flush  3 mL Intravenous Q12H   sodium chloride, sodium chloride, sodium chloride, acetaminophen **OR** acetaminophen, heparin, heparin, lidocaine (PF), lidocaine-prilocaine, ondansetron **OR** ondansetron (ZOFRAN) IV, pentafluoroprop-tetrafluoroeth, sodium chloride flush  Assessment/ Plan:   Recurrent syncope/hypotension orthostatics on 3 times daily midodrine and Florinef.  Appears to have  some reserve in terms of renal function.  And is fairly euvolemic with good urine output.  I agree that there does not appear to be any indication for dialysis at this point.   Anemia status post blood transfusion 10/22/2020 no heparin colonoscopy revealing colitis 10/17/2020.  Biopsies been performed.  Continues on 200 mcg IV Aranesp.  Also IV iron  Urinary retention   Foley catheter placed  End-stage renal disease/stage V chronic kidney disease recent start to dialysis.  Last dialysis 11/17/2020 no emergent indications at this time.  May need for close follow-up with Dr. Theador Hawthorne  Vascular access   follow-up with Dr. Donnetta Hutching  Atrial fibrillation amiodarone and Eliquis.  As outpatient off Eliquis secondary to GI bleed  Hyponatremia stable       LOS: [place] @TODAY @10 :51 AM

## 2020-11-24 NOTE — Progress Notes (Signed)
Subjective:  Patient has no complaints.  He states he only had 1 small bowel movement today.  He does not see any decrease in hiccups.  He denies nausea vomiting or abdominal pain.  He is still gets dizzy when he tries to sit up in bed  Current Medications:  Current Facility-Administered Medications:  .  0.9 %  sodium chloride infusion, 100 mL, Intravenous, PRN, Donato Heinz, MD .  0.9 %  sodium chloride infusion, 100 mL, Intravenous, PRN, Donato Heinz, MD .  0.9 %  sodium chloride infusion, 250 mL, Intravenous, PRN, Manuella Ghazi, Pratik D, DO .  acetaminophen (TYLENOL) tablet 650 mg, 650 mg, Oral, Q6H PRN, 650 mg at 11/23/20 0848 **OR** acetaminophen (TYLENOL) suppository 650 mg, 650 mg, Rectal, Q6H PRN, Manuella Ghazi, Pratik D, DO .  albumin human 25 % solution 50 g, 50 g, Intravenous, Daily, Markian Glockner, Mechele Dawley, MD, Stopped at 11/24/20 1554 .  amiodarone (PACERONE) tablet 200 mg, 200 mg, Oral, Daily, Manuella Ghazi, Pratik D, DO, 200 mg at 11/24/20 1126 .  bismuth subsalicylate (PEPTO BISMOL) 262 MG/15ML suspension 30 mL, 30 mL, Oral, Q6H, Mahala Menghini, PA-C, 30 mL at 11/24/20 1735 .  calcitRIOL (ROCALTROL) capsule 0.5 mcg, 0.5 mcg, Oral, Daily, Donato Heinz, MD, 0.5 mcg at 11/24/20 1126 .  Chlorhexidine Gluconate Cloth 2 % PADS 6 each, 6 each, Topical, Q0600, Heath Lark D, DO, 6 each at 11/24/20 0608 .  Chlorhexidine Gluconate Cloth 2 % PADS 6 each, 6 each, Topical, Q0600, Corliss Parish, MD, 6 each at 11/21/20 [phone] .  Darbepoetin Alfa (ARANESP) injection 200 mcg, 200 mcg, Intravenous, Q Wed-HD, Corliss Parish, MD .  famotidine (PEPCID) tablet 20 mg, 20 mg, Oral, Daily, Johnson, Clanford L, MD, 20 mg at 11/24/20 1124 .  feeding supplement (ENSURE ENLIVE / ENSURE PLUS) liquid 237 mL, 237 mL, Oral, BID BM, Shah, Pratik D, DO, 237 mL at 11/24/20 1603 .  ferric gluconate (NULECIT) 125 mg in sodium chloride 0.9 % 100 mL IVPB, 125 mg, Intravenous, Daily, Corliss Parish, MD, Stopped at  11/24/20 1707 .  finasteride (PROSCAR) tablet 5 mg, 5 mg, Oral, Daily, Ghimire, Henreitta Leber, MD, 5 mg at 11/24/20 1125 .  fludrocortisone (FLORINEF) tablet 0.2 mg, 0.2 mg, Oral, Daily, Corliss Parish, MD, 0.2 mg at 11/24/20 1139 .  heparin injection 1,400 Units, 20 Units/kg, Dialysis, PRN, Donato Heinz, MD, 1,400 Units at 11/17/20 1725 .  heparin injection 3,800 Units, 3,800 Units, Dialysis, PRN, Donato Heinz, MD, 3,800 Units at 11/23/20 1145 .  lidocaine (PF) (XYLOCAINE) 1 % injection 5 mL, 5 mL, Intradermal, PRN, Donato Heinz, MD .  lidocaine-prilocaine (EMLA) cream 1 application, 1 application, Topical, PRN, Donato Heinz, MD .  loperamide (IMODIUM) capsule 2 mg, 2 mg, Oral, TID, Mahala Menghini, PA-C, 2 mg at 11/24/20 1603 .  metoprolol tartrate (LOPRESSOR) tablet 12.5 mg, 12.5 mg, Oral, BID, Manuella Ghazi, Pratik D, DO, 12.5 mg at 11/24/20 1125 .  midodrine (PROAMATINE) tablet 10 mg, 10 mg, Oral, TID with meals, Johnson, Clanford L, MD, 10 mg at 11/24/20 1735 .  multivitamin (RENA-VIT) tablet 1 tablet, 1 tablet, Oral, QHS, Shah, Pratik D, DO, 1 tablet at 11/23/20 2150 .  ondansetron (ZOFRAN) tablet 4 mg, 4 mg, Oral, Q6H PRN **OR** ondansetron (ZOFRAN) injection 4 mg, 4 mg, Intravenous, Q6H PRN, Manuella Ghazi, Pratik D, DO, 4 mg at 11/22/20 1250 .  pentafluoroprop-tetrafluoroeth (GEBAUERS) aerosol 1 application, 1 application, Topical, PRN, Coladonato, Joseph, MD .  simvastatin (ZOCOR) tablet 20 mg, 20 mg, Oral, QPM, Manuella Ghazi, Pratik  D, DO, 20 mg at 11/24/20 1734 .  sodium chloride flush (NS) 0.9 % injection 3 mL, 3 mL, Intravenous, Q12H, Shah, Pratik D, DO, 3 mL at 11/24/20 1127 .  sodium chloride flush (NS) 0.9 % injection 3 mL, 3 mL, Intravenous, PRN, Manuella Ghazi, Pratik D, DO, 3 mL at 11/17/20 1127  Objective: Blood pressure 112/60, pulse 74, temperature 97.9 F (36.6 C), temperature source Axillary, resp. rate 16, height 6' 2"  (1.88 m), weight 66.2 kg, SpO2 100 %. Patient is alert and in  no acute distress. He appears pale Abdomen is full.  Bowel sounds normal.  Percussion note across upper abdomen is very tympanic.  On palpation is soft and nontender with organomegaly or masses He had TED stockings in place  Labs/studies Results:   CBC Latest Ref Rng & Units 11/23/2020 11/22/2020 11/21/2020  WBC 4.0 - 10.5 K/uL 7.0 5.9 -  Hemoglobin 13.0 - 17.0 g/dL 9.5(L) 9.0(L) 8.9(L)  Hematocrit 39.0 - 52.0 % 30.8(L) 29.7(L) 29.7(L)  Platelets 150 - 400 K/uL 254 233 -    CMP Latest Ref Rng & Units 11/24/2020 11/23/2020 11/22/2020  Glucose 70 - 99 mg/dL 136(H) 100(H) 133(H)  BUN 8 - 23 mg/dL 50(H) 40(H) 41(H)  Creatinine 0.61 - 1.24 mg/dL 4.12(H) 3.35(H) 3.34(H)  Sodium 135 - 145 mmol/L 129(L) 130(L) 132(L)  Potassium 3.5 - 5.1 mmol/L 3.8 3.7 3.9  Chloride 98 - 111 mmol/L 100 98 102  CO2 22 - 32 mmol/L 18(L) 20(L) 20(L)  Calcium 8.9 - 10.3 mg/dL 7.7(L) 7.6(L) 7.4(L)  Total Protein 6.5 - 8.1 g/dL - - -  Total Bilirubin 0.3 - 1.2 mg/dL - - -  Alkaline Phos 38 - 126 U/L - - -  AST 15 - 41 U/L - - -  ALT 0 - 44 U/L - - -    Hepatic Function Latest Ref Rng & Units 11/24/2020 11/23/2020 11/22/2020  Total Protein 6.5 - 8.1 g/dL - - -  Albumin 3.5 - 5.0 g/dL 2.3(L) 1.9(L) 1.9(L)  AST 15 - 41 U/L - - -  ALT 0 - 44 U/L - - -  Alk Phosphatase 38 - 126 U/L - - -  Total Bilirubin 0.3 - 1.2 mg/dL - - -  Bilirubin, Direct 0.0 - 0.2 mg/dL - - -   Ileal and colonic biopsies are pending. Fecal elastase pending.  Assessment:  #1.  Diarrhea.  Patient has undergone extensive work-up.  He had colonoscopy 3 days ago revealing extensive ulceration to the distal sigmoid colon suggestive of ischemic colitis appear to be healing.  Colon was somewhat dilated.  He may have an element of megacolon.  Diarrhea possibly is multifactorial.  He is being screened for pancreatic insufficiency.  Fecal elastase is pending  #2.  Anemia appears to be multifactorial.  He did receive a unit of PRBCs prior to his  colonoscopy 2 days ago.  No evidence of overt GI bleed.  Hemoglobin stable over the last 24 hours.  #3.  Chronic kidney disease.  He is being followed by nephrology.  Renal function has worsened somewhat in the last 24 hours.  He now has gone 1 week without hemodialysis  #4.  Orthostatic hypotension.  He has not responded to midodrine, mineralocorticoid, TED stockings and abdominal binder.  Patient has received 2 doses of IV albumin.  So far no change in orthostasis  #5.  Hiccups.  He did not respond to low-dose IV metoclopramide.   Recommendations  Continue loperamide 2 mg 3 times daily Wait for  results of fecal elastase fasting cortisol level ileal and colonic biopsies.

## 2020-11-25 DIAGNOSIS — D123 Benign neoplasm of transverse colon: Secondary | ICD-10-CM | POA: Diagnosis not present

## 2020-11-25 DIAGNOSIS — R197 Diarrhea, unspecified: Secondary | ICD-10-CM | POA: Diagnosis not present

## 2020-11-25 DIAGNOSIS — D122 Benign neoplasm of ascending colon: Secondary | ICD-10-CM | POA: Diagnosis not present

## 2020-11-25 DIAGNOSIS — K529 Noninfective gastroenteritis and colitis, unspecified: Secondary | ICD-10-CM | POA: Diagnosis not present

## 2020-11-25 LAB — RENAL FUNCTION PANEL
Albumin: 2.6 g/dL — ABNORMAL LOW (ref 3.5–5.0)
Anion gap: 11 (ref 5–15)
BUN: 56 mg/dL — ABNORMAL HIGH (ref 8–23)
CO2: 18 mmol/L — ABNORMAL LOW (ref 22–32)
Calcium: 7.7 mg/dL — ABNORMAL LOW (ref 8.9–10.3)
Chloride: 99 mmol/L (ref 98–111)
Creatinine, Ser: 4.9 mg/dL — ABNORMAL HIGH (ref 0.61–1.24)
GFR, Estimated: 12 mL/min — ABNORMAL LOW (ref >60)
Glucose, Bld: 125 mg/dL — ABNORMAL HIGH (ref 70–99)
Phosphorus: 4.5 mg/dL (ref 2.5–4.6)
Potassium: 4.2 mmol/L (ref 3.5–5.1)
Sodium: 128 mmol/L — ABNORMAL LOW (ref 135–145)

## 2020-11-25 MED ORDER — SIMETHICONE 80 MG PO CHEW
80.0000 mg | CHEWABLE_TABLET | Freq: Four times a day (QID) | ORAL | Status: AC
  Administered 2020-11-25 – 2020-11-26 (×4): 80 mg via ORAL
  Filled 2020-11-25 (×4): qty 1

## 2020-11-25 MED ORDER — DARBEPOETIN ALFA 200 MCG/0.4ML IJ SOSY
200.0000 ug | PREFILLED_SYRINGE | INTRAMUSCULAR | Status: DC
  Administered 2020-11-27 – 2020-12-10 (×3): 200 ug via INTRAVENOUS
  Filled 2020-11-25 (×3): qty 0.4

## 2020-11-25 MED ORDER — PYRIDOSTIGMINE BROMIDE 60 MG PO TABS
60.0000 mg | ORAL_TABLET | Freq: Every day | ORAL | Status: DC
  Administered 2020-11-25 – 2020-12-11 (×16): 60 mg via ORAL
  Filled 2020-11-25 (×17): qty 1

## 2020-11-25 MED ORDER — CHLORPROMAZINE HCL 10 MG PO TABS
10.0000 mg | ORAL_TABLET | Freq: Four times a day (QID) | ORAL | Status: DC | PRN
  Filled 2020-11-25: qty 1

## 2020-11-25 MED ORDER — CHLORPROMAZINE HCL 25 MG PO TABS
25.0000 mg | ORAL_TABLET | Freq: Four times a day (QID) | ORAL | Status: DC | PRN
  Filled 2020-11-25: qty 1

## 2020-11-25 NOTE — Progress Notes (Signed)
PROGRESS NOTE   [name]  JJO:[id number] DOB: March 20, 1951 DOA: 11/02/2020 PCP: Patient, No Pcp Per   Chief Complaint  Patient presents with  . Loss of Consciousness    Brief Admission History:  69 y.o. male ESRD on HD MWF, DM-2, atrial fibrillation, HLD-recent hospitalization for colitis/diarrhea-presented to the hospital for evaluation of syncope due to orthostatic hypotension.  Hospital course complicated by ongoing diarrhea and persistent orthostatic hypotension. Assessment & Plan:   Active Problems:   Syncope due to orthostatic hypotension   ESRD on hemodialysis (HCC)   Colitis  Orthostatic Syncope - Pt has been improving after starting midodrine, florinef, LE stockings and abdominal binder.  Continue working with PT.   Added IV albumin per Dr. Laural Golden 12/24 to increase oncotic pressures.   Chronic diarrhea -improving, rectal tube has been removed, diarrhea has been improving.  S/p colonoscopy 12/22.  Awaiting biopsy results.  Stool frequency is improving and slowing down.   ESRD on HD -  HD on hold for now per nephrology service.  Temporarily holding HD given good urine output.  Following lytes closely.  Unfortunately his creatinine is rising daily which might be.  Continue to follow closely.    PAF - stable on amiodarone/metoprolol.  Apixaban was held by cardiology due to recent rectal bleeding and acute blood loss anemia.  Anticoagulation to be addressed by outpatient cardiology follow up.   Anemia in CKD - stable, following.   HLD - resume home statin therapy.   Acute urinary retention - s/p foley placement 12/16.  Foley removed 12/23 and he had been voiding.  Unfortunately he developed acute urinary retention again on 12/25 and foley was replaced with >900 mL urine relieved.  Will leave foley in place for now until he can follow up outpatient with urology.    DVT prophylaxis:  Full  Code Status: full  Family Communication: Pt prefers to convey to family healthcare  info Disposition:  SNF when medically cleared by GI service  Status is: Inpatient  Remains inpatient appropriate because:Inpatient level of care appropriate due to severity of illness. Awaiting clearance from GI for discharge to SNF.     Dispo:  Patient From: [facility]  Planned Disposition: [facility]  Expected discharge date: 11/26/2020  Medically stable for discharge: No  Consultants:   GI   Nephrology  cardiology  Procedures:  11/17>> colon biopsy: Severely active chronic nonspecific colitis with ulceration-negative for granulomas or dysplasia.  Stains for CMV negative.  Antimicrobials:  Ciprofloxacin: 11/13>> 11/22 Flagyl: 11/13>> 11/22   Subjective: Pt says bowel movement slowing down, says he is tolerating foley well   Objective: Vitals:   11/25/20 0434 11/25/20 0436 11/25/20 0504 11/25/20 0948  BP: (!) 86/47 (!) 94/46 (!) 123/58 (!) 116/58  Pulse: 83 84 79 92  Resp: 18 18  16   Temp: 97.7 F (36.5 C) 97.7 F (36.5 C)  98.6 F (37 C)  TempSrc:    Oral  SpO2: 97% 98%  100%  Weight:      Height:        Intake/Output Summary (Last 24 hours) at 11/25/2020 1046 Last data filed at 11/25/2020 0956 Gross per 24 hour  Intake 303 ml  Output 950 ml  Net -647 ml   Filed Weights   11/20/20 0602 11/21/20 0600 11/24/20 0543  Weight: 67.8 kg 68.3 kg 66.2 kg    Examination:  General exam: chronically ill appearing male, Appears calm and comfortable  Respiratory system: Clear to auscultation. Respiratory  effort normal. Cardiovascular system: S1 & S2 heard. No JVD, murmurs, rubs, gallops or clicks. No pedal edema. Gastrointestinal system: Abdomen is nondistended, soft and nontender. No organomegaly or masses felt. Normal bowel sounds heard. GU: foley in place draining amber urine Central nervous system: Alert and oriented. No focal neurological deficits. Extremities: Symmetric 5 x 5 power. Skin: No rashes, lesions or  ulcers Psychiatry: Judgement and insight appear normal. Mood & affect appropriate.   Data Reviewed: I have personally reviewed following labs and imaging studies  CBC: Recent Labs  Lab 11/19/20 0506 11/20/20 0539 11/21/20 0911 11/21/20 2112 11/22/20 0616 11/23/20 0503  WBC 7.3 7.3 6.0  --  5.9 7.0  HGB 7.7* 7.5* 7.9* 8.9* 9.0* 9.5*  HCT 24.9* 24.4* 25.5* 29.7* 29.7* 30.8*  MCV 93.6 94.9 95.1  --  94.6 95.4  PLT 177 192 213  --  233 323    Basic Metabolic Panel: Recent Labs  Lab 11/19/20 0506 11/20/20 0539 11/21/20 0911 11/22/20 0616 11/23/20 0503 11/24/20 0721 11/25/20 0754  NA 128* 128* 131* 132* 130* 129* 128*  K 3.2* 3.3* 3.8 3.9 3.7 3.8 4.2  CL 97* 99 102 102 98 100 99  CO2 22 20* 19* 20* 20* 18* 18*  GLUCOSE 94 105* 92 133* 100* 136* 125*  BUN 41* 45* 43* 41* 40* 50* 56*  CREATININE 2.79* 3.15* 3.13* 3.34* 3.35* 4.12* 4.90*  CALCIUM 7.2* 7.2* 7.4* 7.4* 7.6* 7.7* 7.7*  MG 1.8 1.9  --   --   --   --   --   PHOS 3.5 3.9 3.8  --  3.2 5.0* 4.5    GFR: Estimated Creatinine Clearance: 13.1 mL/min (A) (by C-G formula based on SCr of 4.9 mg/dL (H)).  Liver Function Tests: Recent Labs  Lab 11/21/20 0911 11/22/20 0616 11/23/20 0503 11/24/20 0721 11/25/20 0754  ALBUMIN 1.8* 1.9* 1.9* 2.3* 2.6*    CBG: No results for input(s): GLUCAP in the last 168 hours.  Recent Results (from the past 240 hour(s))  Gastrointestinal Panel by PCR , Stool     Status: None   Collection Time: 11/21/20  3:46 PM   Specimen: Stool  Result Value Ref Range Status   Campylobacter species NOT DETECTED NOT DETECTED Final   Plesimonas shigelloides NOT DETECTED NOT DETECTED Final   Salmonella species NOT DETECTED NOT DETECTED Final   Yersinia enterocolitica NOT DETECTED NOT DETECTED Final   Vibrio species NOT DETECTED NOT DETECTED Final   Vibrio cholerae NOT DETECTED NOT DETECTED Final   Enteroaggregative E coli (EAEC) NOT DETECTED NOT DETECTED Final   Enteropathogenic E coli (EPEC)  NOT DETECTED NOT DETECTED Final   Enterotoxigenic E coli (ETEC) NOT DETECTED NOT DETECTED Final   Shiga like toxin producing E coli (STEC) NOT DETECTED NOT DETECTED Final   Shigella/Enteroinvasive E coli (EIEC) NOT DETECTED NOT DETECTED Final   Cryptosporidium NOT DETECTED NOT DETECTED Final   Cyclospora cayetanensis NOT DETECTED NOT DETECTED Final   Entamoeba histolytica NOT DETECTED NOT DETECTED Final   Giardia lamblia NOT DETECTED NOT DETECTED Final   Adenovirus F40/41 NOT DETECTED NOT DETECTED Final   Astrovirus NOT DETECTED NOT DETECTED Final   Norovirus GI/GII NOT DETECTED NOT DETECTED Final   Rotavirus A NOT DETECTED NOT DETECTED Final   Sapovirus (I, II, IV, and V) NOT DETECTED NOT DETECTED Final    Comment: Performed at [facility], [address] [place], [place] [address]  SARS Coronavirus 2 by RT PCR (hospital order, performed in [facility] hospital lab) Nasopharyngeal Nasopharyngeal Swab     Status: None   Collection Time: 11/22/20 10:35 AM   Specimen: Nasopharyngeal Swab  Result Value Ref Range Status   SARS Coronavirus 2 NEGATIVE NEGATIVE Final    Comment: (NOTE) SARS-CoV-2 target nucleic acids are NOT DETECTED.  The SARS-CoV-2 RNA is generally detectable in upper and lower respiratory specimens during the acute phase of infection. The lowest concentration of SARS-CoV-2 viral copies this assay can detect is 250 copies / mL. A negative result does not preclude SARS-CoV-2 infection and should not be used as the sole basis for treatment or other patient management decisions.  A negative result may occur with improper specimen collection / handling, submission of specimen other than nasopharyngeal swab, presence of viral mutation(s) within the areas targeted by this assay, and inadequate number of viral copies (<250 copies / mL). A negative result must be combined with clinical observations, patient history, and epidemiological information.  Fact Sheet for  Patients:   StrictlyIdeas.no  Fact Sheet for Healthcare Providers: BankingDealers.co.za  This test is not yet approved or  cleared by the [place] FDA and has been authorized for detection and/or diagnosis of SARS-CoV-2 by FDA under an Emergency Use Authorization (EUA).  This EUA will remain in effect (meaning this test can be used) for the duration of the COVID-19 declaration under Section 564(b)(1) of the Act, 21 U.S.C. section 360bbb-3(b)(1), unless the authorization is terminated or revoked sooner.  Performed at [facility], [address] [place], [place] [address]      Radiology Studies: No results found.   Scheduled Meds: . amiodarone  200 mg Oral Daily  . bismuth subsalicylate  30 mL Oral Q6H  . calcitRIOL  0.5 mcg Oral Daily  . Chlorhexidine Gluconate Cloth  6 each Topical Q0600  . Chlorhexidine Gluconate Cloth  6 each Topical Q0600  . darbepoetin (ARANESP) injection - DIALYSIS  200 mcg Intravenous Q Wed-HD  . famotidine  20 mg Oral Daily  . feeding supplement  237 mL Oral BID BM  . finasteride  5 mg Oral Daily  . fludrocortisone  0.2 mg Oral Daily  . loperamide  2 mg Oral TID  . metoprolol tartrate  12.5 mg Oral BID  . midodrine  10 mg Oral TID with meals  . multivitamin  1 tablet Oral QHS  . simvastatin  20 mg Oral QPM  . sodium chloride flush  3 mL Intravenous Q12H   Continuous Infusions: . sodium chloride    . sodium chloride    . sodium chloride    . albumin human Stopped (11/24/20 1554)  . ferric gluconate (FERRLECIT/NULECIT) IV 125 mg (11/25/20 0952)    LOS: 23 days   Time spent: 35 mins   Jeena Arnett Wynetta Emery, MD How to contact the [facility] Attending or Consulting provider Phil Campbell or covering provider during after hours [place], for this patient?  1. Check the care team in [facility] and look for a) attending/consulting [facility] provider listed and b) the [facility] team listed 2. Log into [email or web address] and use [facility]'s universal password to access. If you do not have the password, please contact the hospital operator. 3. Locate the [facility] provider you are looking for under [facility] and page to a number that you can be directly reached. 4. If you still have difficulty reaching the provider, please page the [facility] (Director on Call) for the Hospitalists listed on amion for assistance.  11/25/2020, 10:46 AM

## 2020-11-25 NOTE — Plan of Care (Signed)

## 2020-11-25 NOTE — Progress Notes (Signed)
Patient asleep, emptied foley.

## 2020-11-25 NOTE — Progress Notes (Addendum)
Subjective:  Patient said he feels about the same.  He remains with hiccups.  He denies abdominal pain.  He is passing fair amount of stool.  He had 2 loose stools today.  There is small volume.  He says his appetite has improved.  He had most of his breakfast and lunch and did not get sick.  He denies abdominal pain.  He did not try to set up today.  But he does raise head of her bed every now and then.  Current Medications:  Current Facility-Administered Medications:  .  0.9 %  sodium chloride infusion, 100 mL, Intravenous, PRN, Donato Heinz, MD .  0.9 %  sodium chloride infusion, 100 mL, Intravenous, PRN, Donato Heinz, MD .  0.9 %  sodium chloride infusion, 250 mL, Intravenous, PRN, Manuella Ghazi, Pratik D, DO .  acetaminophen (TYLENOL) tablet 650 mg, 650 mg, Oral, Q6H PRN, 650 mg at 11/23/20 0848 **OR** acetaminophen (TYLENOL) suppository 650 mg, 650 mg, Rectal, Q6H PRN, Manuella Ghazi, Pratik D, DO .  amiodarone (PACERONE) tablet 200 mg, 200 mg, Oral, Daily, Manuella Ghazi, Pratik D, DO, 200 mg at 11/25/20 0954 .  bismuth subsalicylate (PEPTO BISMOL) 262 MG/15ML suspension 30 mL, 30 mL, Oral, Q6H, Neil Crouch S, PA-C, 30 mL at 11/25/20 1151 .  calcitRIOL (ROCALTROL) capsule 0.5 mcg, 0.5 mcg, Oral, Daily, Donato Heinz, MD, 0.5 mcg at 11/25/20 0954 .  Chlorhexidine Gluconate Cloth 2 % PADS 6 each, 6 each, Topical, Q0600, Heath Lark D, DO, 6 each at 11/25/20 [phone] .  Chlorhexidine Gluconate Cloth 2 % PADS 6 each, 6 each, Topical, Q0600, Corliss Parish, MD, 6 each at 11/25/20 [phone] .  chlorproMAZINE (THORAZINE) tablet 25 mg, 25 mg, Oral, QID PRN, Wynetta Emery, Clanford L, MD .  Derrill Memo ON 11/26/2020] Darbepoetin Alfa (ARANESP) injection 200 mcg, 200 mcg, Intravenous, Q Mon-HD, Coladonato, Joseph, MD .  famotidine (PEPCID) tablet 20 mg, 20 mg, Oral, Daily, Johnson, Clanford L, MD, 20 mg at 11/25/20 0953 .  feeding supplement (ENSURE ENLIVE / ENSURE PLUS) liquid 237 mL, 237 mL, Oral, BID BM, Shah, Pratik  D, DO, 237 mL at 11/24/20 1603 .  ferric gluconate (NULECIT) 125 mg in sodium chloride 0.9 % 100 mL IVPB, 125 mg, Intravenous, Daily, Corliss Parish, MD, Stopped at 11/25/20 1144 .  finasteride (PROSCAR) tablet 5 mg, 5 mg, Oral, Daily, Ghimire, Henreitta Leber, MD, 5 mg at 11/25/20 0953 .  fludrocortisone (FLORINEF) tablet 0.2 mg, 0.2 mg, Oral, Daily, Corliss Parish, MD, 0.2 mg at 11/25/20 0953 .  heparin injection 1,400 Units, 20 Units/kg, Dialysis, PRN, Donato Heinz, MD, 1,400 Units at 11/17/20 1725 .  heparin injection 3,800 Units, 3,800 Units, Dialysis, PRN, Donato Heinz, MD, 3,800 Units at 11/23/20 1145 .  lidocaine (PF) (XYLOCAINE) 1 % injection 5 mL, 5 mL, Intradermal, PRN, Donato Heinz, MD .  lidocaine-prilocaine (EMLA) cream 1 application, 1 application, Topical, PRN, Donato Heinz, MD .  loperamide (IMODIUM) capsule 2 mg, 2 mg, Oral, TID, Mahala Menghini, PA-C, 2 mg at 11/25/20 0953 .  metoprolol tartrate (LOPRESSOR) tablet 12.5 mg, 12.5 mg, Oral, BID, Manuella Ghazi, Pratik D, DO, 12.5 mg at 11/25/20 0954 .  midodrine (PROAMATINE) tablet 10 mg, 10 mg, Oral, TID with meals, Johnson, Clanford L, MD, 10 mg at 11/25/20 1315 .  multivitamin (RENA-VIT) tablet 1 tablet, 1 tablet, Oral, QHS, Shah, Pratik D, DO, 1 tablet at 11/24/20 2205 .  ondansetron (ZOFRAN) tablet 4 mg, 4 mg, Oral, Q6H PRN **OR** ondansetron (ZOFRAN) injection 4 mg, 4 mg, Intravenous, Q6H  PRN, Manuella Ghazi, Pratik D, DO, 4 mg at 11/22/20 1250 .  pentafluoroprop-tetrafluoroeth (GEBAUERS) aerosol 1 application, 1 application, Topical, PRN, Coladonato, Joseph, MD .  pyridostigmine (MESTINON) tablet 60 mg, 60 mg, Oral, Daily, Donato Heinz, MD, 60 mg at 11/25/20 1315 .  simethicone (MYLICON) chewable tablet 80 mg, 80 mg, Oral, QID, Alyla Pietila U, MD .  simvastatin (ZOCOR) tablet 20 mg, 20 mg, Oral, QPM, Shah, Pratik D, DO, 20 mg at 11/24/20 1734 .  sodium chloride flush (NS) 0.9 % injection 3 mL, 3 mL,  Intravenous, Q12H, Shah, Pratik D, DO, 3 mL at 11/25/20 0956 .  sodium chloride flush (NS) 0.9 % injection 3 mL, 3 mL, Intravenous, PRN, Manuella Ghazi, Pratik D, DO, 3 mL at 11/17/20 1127  Objective: Blood pressure (!) 108/57, pulse 82, temperature 99.1 F (37.3 C), temperature source Oral, resp. rate 16, height 6' 2"  (1.88 m), weight 66.2 kg, SpO2 99 %. Patient is alert and in no acute distress. He appears pale Abdominal examination was performed after undergoing abdominal binder.  Percussion note remains very tender and aching epigastric region.  Bowel sounds are normal.  On palpation abdomen is soft and nontender with organomegaly or masses. He had TED stockings in place  Labs/studies Results:   CBC Latest Ref Rng & Units 11/23/2020 11/22/2020 11/21/2020  WBC 4.0 - 10.5 K/uL 7.0 5.9 -  Hemoglobin 13.0 - 17.0 g/dL 9.5(L) 9.0(L) 8.9(L)  Hematocrit 39.0 - 52.0 % 30.8(L) 29.7(L) 29.7(L)  Platelets 150 - 400 K/uL 254 233 -    CMP Latest Ref Rng & Units 11/25/2020 11/24/2020 11/23/2020  Glucose 70 - 99 mg/dL 125(H) 136(H) 100(H)  BUN 8 - 23 mg/dL 56(H) 50(H) 40(H)  Creatinine 0.61 - 1.24 mg/dL 4.90(H) 4.12(H) 3.35(H)  Sodium 135 - 145 mmol/L 128(L) 129(L) 130(L)  Potassium 3.5 - 5.1 mmol/L 4.2 3.8 3.7  Chloride 98 - 111 mmol/L 99 100 98  CO2 22 - 32 mmol/L 18(L) 18(L) 20(L)  Calcium 8.9 - 10.3 mg/dL 7.7(L) 7.7(L) 7.6(L)  Total Protein 6.5 - 8.1 g/dL - - -  Total Bilirubin 0.3 - 1.2 mg/dL - - -  Alkaline Phos 38 - 126 U/L - - -  AST 15 - 41 U/L - - -  ALT 0 - 44 U/L - - -    Hepatic Function Latest Ref Rng & Units 11/25/2020 11/24/2020 11/23/2020  Total Protein 6.5 - 8.1 g/dL - - -  Albumin 3.5 - 5.0 g/dL 2.6(L) 2.3(L) 1.9(L)  AST 15 - 41 U/L - - -  ALT 0 - 44 U/L - - -  Alk Phosphatase 38 - 126 U/L - - -  Total Bilirubin 0.3 - 1.2 mg/dL - - -  Bilirubin, Direct 0.0 - 0.2 mg/dL - - -   Ileal and colonic biopsies are pending. Fecal elastase pending.  Fasting cortisol level  23.1  Assessment:  #1.  Diarrhea.  Diarrhea seems to be getting better.  He has undergone extensive work-up and most salient finding has been 1 of sigmoid colon ulceration noted on colonoscopy earlier this week.  Flexible sigmoidoscopy last month revealed focal ulcerations and he was empirically treated with steroids and he did not improve.  Etiology felt to be ischemic.  I feel diarrhea may be multifactorial.  He is also being screened for pancreatic insufficiency. It remains to be seen if diarrhea would worsen with pyridostigmine.  It might even help if he has an element of pseudoobstruction.  #2.  Anemia appears to be multifactorial.  He  did receive a unit of PRBCs prior to his colonoscopy three days ago.  No evidence of overt GI bleed.     #3.  Chronic kidney disease.  Renal function is getting worse.  Dr. Marval Regal has recommended resuming dialysis starting tomorrow.  #4.  Orthostatic hypotension.  Etiology felt to be autonomic dysfunction.  He does not appear to be volume depleted which he was when he presented he has not responded to midodrine and Florinef.  He previously was on Solu-Medrol primarily for colitis and that did not make any difference either.  Albumin infusion has not made any difference.  He received third dose today.  Discussed with Dr. Marval Regal and Dr. Wynetta Emery earlier today and patient begun on pyridostigmine.  #5.  Hiccups.  He did not respond to IV metoclopramide.  To try low-dose chlorpromazine as recommended by Dr. Wynetta Emery.  Hopefully it will not have deleterious effect on his blood pressure.   Recommendations  Simethicone 80 mg p.o. 4 times daily as needed. Will continue loperamide at 2 mg p.o. 3 times daily. Consider dropping amiodarone dose 200 mg daily if feasible as it may be contributing to. orthostatic hypotension.

## 2020-11-25 NOTE — Progress Notes (Signed)
Patient ID: MONTAVIUS SUBRAMANIAM, male   DOB: December 15, 1950, 69 y.o.   MRN: [id number] S: Diarrhea is improving, however he remains profoundly orthostatic.  Also had foley catheter replaced yesterday due to urinary retention. O:BP (!) 116/58 (BP Location: Left Arm)   Pulse 92   Temp 98.6 F (37 C) (Oral)   Resp 16   Ht 6\' 2"  (1.88 m)   Wt 66.2 kg   SpO2 100%   BMI 18.74 kg/m   Intake/Output Summary (Last 24 hours) at 11/25/2020 1036 Last data filed at 11/25/2020 0956 Gross per 24 hour  Intake 303 ml  Output 950 ml  Net -647 ml   Intake/Output: I/O last 3 completed shifts: In: 300 [IV Piggyback:300] Out: 1950 [Urine:1950]  Intake/Output this shift:  Total I/O In: 3 [I.V.:3] Out: -  Weight change:  Gen: NAD CVS: RRR Resp: cta Abd: +BS, soft, NT/ND, abdominal binder was around his chest  Ext: trace pedal edema  Recent Labs  Lab 11/19/20 0506 11/20/20 0539 11/21/20 0911 11/22/20 0616 11/23/20 0503 11/24/20 0721 11/25/20 0754  NA 128* 128* 131* 132* 130* 129* 128*  K 3.2* 3.3* 3.8 3.9 3.7 3.8 4.2  CL 97* 99 102 102 98 100 99  CO2 22 20* 19* 20* 20* 18* 18*  GLUCOSE 94 105* 92 133* 100* 136* 125*  BUN 41* 45* 43* 41* 40* 50* 56*  CREATININE 2.79* 3.15* 3.13* 3.34* 3.35* 4.12* 4.90*  ALBUMIN 1.8* 1.8* 1.8* 1.9* 1.9* 2.3* 2.6*  CALCIUM 7.2* 7.2* 7.4* 7.4* 7.6* 7.7* 7.7*  PHOS 3.5 3.9 3.8  --  3.2 5.0* 4.5   Liver Function Tests: Recent Labs  Lab 11/23/20 0503 11/24/20 0721 11/25/20 0754  ALBUMIN 1.9* 2.3* 2.6*   No results for input(s): LIPASE, AMYLASE in the last 168 hours. No results for input(s): AMMONIA in the last 168 hours. CBC: Recent Labs  Lab 11/19/20 0506 11/20/20 0539 11/21/20 0911 11/21/20 2112 11/22/20 0616 11/23/20 0503  WBC 7.3 7.3 6.0  --  5.9 7.0  HGB 7.7* 7.5* 7.9* 8.9* 9.0* 9.5*  HCT 24.9* 24.4* 25.5* 29.7* 29.7* 30.8*  MCV 93.6 94.9 95.1  --  94.6 95.4  PLT 177 192 213  --  233 254   Cardiac Enzymes: No results for input(s): CKTOTAL,  CKMB, CKMBINDEX, TROPONINI in the last 168 hours. CBG: No results for input(s): GLUCAP in the last 168 hours.  Iron Studies: No results for input(s): IRON, TIBC, TRANSFERRIN, FERRITIN in the last 72 hours. Studies/Results: No results found. Marland Kitchen amiodarone  200 mg Oral Daily  . bismuth subsalicylate  30 mL Oral Q6H  . calcitRIOL  0.5 mcg Oral Daily  . Chlorhexidine Gluconate Cloth  6 each Topical Q0600  . Chlorhexidine Gluconate Cloth  6 each Topical Q0600  . darbepoetin (ARANESP) injection - DIALYSIS  200 mcg Intravenous Q Wed-HD  . famotidine  20 mg Oral Daily  . feeding supplement  237 mL Oral BID BM  . finasteride  5 mg Oral Daily  . fludrocortisone  0.2 mg Oral Daily  . loperamide  2 mg Oral TID  . metoprolol tartrate  12.5 mg Oral BID  . midodrine  10 mg Oral TID with meals  . multivitamin  1 tablet Oral QHS  . simvastatin  20 mg Oral QPM  . sodium chloride flush  3 mL Intravenous Q12H    BMET    Component Value Date/Time   NA 128 (L) 11/25/2020 0754   K 4.2 11/25/2020 0754   CL  99 11/25/2020 0754   CO2 18 (L) 11/25/2020 0754   GLUCOSE 125 (H) 11/25/2020 0754   BUN 56 (H) 11/25/2020 0754   CREATININE 4.90 (H) 11/25/2020 0754   CALCIUM 7.7 (L) 11/25/2020 0754   GFRNONAA 12 (L) 11/25/2020 0754   GFRAA 19 (L) 06/15/2018 0407   CBC    Component Value Date/Time   WBC 7.0 11/23/2020 0503   RBC 3.23 (L) 11/23/2020 0503   HGB 9.5 (L) 11/23/2020 0503   HCT 30.8 (L) 11/23/2020 0503   PLT 254 11/23/2020 0503   MCV 95.4 11/23/2020 0503   MCH 29.4 11/23/2020 0503   MCHC 30.8 11/23/2020 0503   RDW 19.3 (H) 11/23/2020 0503   LYMPHSABS 0.5 (L) 11/02/2020 1209   MONOABS 1.2 (H) 11/02/2020 1209   EOSABS 0.0 11/02/2020 1209   BASOSABS 0.1 11/02/2020 1209   Assessment/Plan:  1. RecurrentSyncope/hypotension; sig orthostasis/autonomic dysfunction:On TID Midodrine and florinef. HD with minimal/no UFdue to good UOP.Keeping net even on HD. 1. Pleasefollow orthostatics and  ambulate with PT 2. Midodrine 10mg  TID 3. Maintain abd binder and compression stockings 4. florinef increased to 0.2 daily 12/20 5. Will add pyridostigmine 60 mg to current regimen as he is still profoundly orthostatic. 6. Recommend transfer to tertiary care center for more specialized care as he is not improving thus far despite multiple medications/procedures. 7. Would also consider strattera (atomoxetine 18 mg) in combination with pyridostigmine for synergistic effects. 2. Colitis- Ranee Gosselin been following and he completed a course ofcipro/flagyl. He had colonoscopy on 10/17/20 -did noteblood in the rectum and sigmoid colon, in the descending colon and at the splenic flexure; colitis involving descending and sigmoid colons seen. Biopsies performed. Per GI and surgery. Has had extensive work up and interventions however diagnosis is still elusive. 5-HIAA levels WNL.Colonoscopy 12/22 showed large sigmoid polyp and 2 from hepatic flexure. Biopsies taken and pending.  3. Urinary retention- this is new. Pt with significant UOP.Improved withfoley catheter andmay need outpatientUrology evaluation. Unclear if this is drug related or due to BPH 1. Foley catheter out and will need to monitor bladder scans.  No uop documented thus far today and may need to replace foley. 4. [facility]. 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 1. No amyloid deposits reported. 2. Last HD Sat 12/18, has had adequate urine output with stable labs, monitor for now. Holding HD since but now BUN/Cr climbing and pt with hiccups.  Will plan to resume HD tomorrow and follow.  Will keep even 5. Anemia:of CKD, possibly some GIB- s/p blood transfusion. No heparin with HD. GI on board,  colonoscopyfindings as above. Rec 1u prbc 11/22 1. Transfuse as needed to keep Hgb >7 2. On Aranesp weekly- will inc dose- iron stores low on 12/10 and repleted.  Will recheck and continue IV iron if still low.  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 Severe protein malnutrition:per primary, push protein 8. Vascular access- will need to f/u with Dr. Donnetta Hutching once stable for discharge. 9. A fib- on amiodarone and eliquis (off A/C as of right now) 10. Hyponatremia. Stable and asymptomatic.  Likely due to edema and meds 11. Disposition- pending ability to safely ambulate before discharge.  Consider transfer to tertiary care center for more treatment options.  Would also consider palliative care consult to help set goals/limits of care.    Claire Dolores A.  Marval Regal, MD [name] [phone]

## 2020-11-26 ENCOUNTER — Encounter (HOSPITAL_COMMUNITY): Payer: Self-pay | Admitting: Internal Medicine

## 2020-11-26 DIAGNOSIS — Z515 Encounter for palliative care: Secondary | ICD-10-CM

## 2020-11-26 DIAGNOSIS — K529 Noninfective gastroenteritis and colitis, unspecified: Secondary | ICD-10-CM

## 2020-11-26 DIAGNOSIS — R197 Diarrhea, unspecified: Secondary | ICD-10-CM

## 2020-11-26 DIAGNOSIS — Z7189 Other specified counseling: Secondary | ICD-10-CM

## 2020-11-26 LAB — RENAL FUNCTION PANEL
Albumin: 2.9 g/dL — ABNORMAL LOW (ref 3.5–5.0)
Anion gap: 13 (ref 5–15)
BUN: 68 mg/dL — ABNORMAL HIGH (ref 8–23)
CO2: 17 mmol/L — ABNORMAL LOW (ref 22–32)
Calcium: 8 mg/dL — ABNORMAL LOW (ref 8.9–10.3)
Chloride: 98 mmol/L (ref 98–111)
Creatinine, Ser: 5.73 mg/dL — ABNORMAL HIGH (ref 0.61–1.24)
GFR, Estimated: 10 mL/min — ABNORMAL LOW (ref >60)
Glucose, Bld: 116 mg/dL — ABNORMAL HIGH (ref 70–99)
Phosphorus: 3.8 mg/dL (ref 2.5–4.6)
Potassium: 3.8 mmol/L (ref 3.5–5.1)
Sodium: 128 mmol/L — ABNORMAL LOW (ref 135–145)

## 2020-11-26 LAB — CBC
HCT: 26 % — ABNORMAL LOW (ref 39.0–52.0)
Hemoglobin: 7.9 g/dL — ABNORMAL LOW (ref 13.0–17.0)
MCH: 28.7 pg (ref 26.0–34.0)
MCHC: 30.4 g/dL (ref 30.0–36.0)
MCV: 94.5 fL (ref 80.0–100.0)
Platelets: 211 10*3/uL (ref 150–400)
RBC: 2.75 MIL/uL — ABNORMAL LOW (ref 4.22–5.81)
RDW: 19.3 % — ABNORMAL HIGH (ref 11.5–15.5)
WBC: 15.4 10*3/uL — ABNORMAL HIGH (ref 4.0–10.5)
nRBC: 0 % (ref 0.0–0.2)

## 2020-11-26 LAB — IRON AND TIBC: Iron: 9 ug/dL — ABNORMAL LOW (ref 45–182)

## 2020-11-26 LAB — FERRITIN: Ferritin: 1477 ng/mL — ABNORMAL HIGH (ref 24–336)

## 2020-11-26 NOTE — Consult Note (Signed)
Consultation Note Date: 11/26/2020   Patient Name: [name]  DOB: 02/24/1951  MRN: [id number]  Age / Sex: 69 y.o., male  PCP: Patient, No Pcp Per Referring Physician: Murlean Iba, MD  Reason for Consultation: Establishing goals of care and Psychosocial/spiritual support  HPI/Patient Profile: 69 y.o. male  with past medical history of ESRD on HD and relatively new to hemodialysis, DM2, A. fib, HLD, recent hospitalization for colitis/diarrhea admitted on 11/02/2020 with syncope due to orthostatic hypotension, ESRD on HD, chronic diarrhea improving.   Clinical Assessment and Goals of Care: I have reviewed medical records including EPIC notes, labs and imaging, received report from attending, examined the patient and met at bedside with Mr. Epling to discuss diagnosis prognosis, GOC, EOL wishes, disposition and options.  Mr. Creelman is lying quietly in bed.  He appears somewhat frail.  He will make an somewhat keep eye contact, is able to make his needs known.  There is no family at bedside at this time.  I introduced Palliative Medicine as specialized medical care for people living with serious illness. It focuses on providing relief from the symptoms and stress of a serious illness.   We talked about the treatment plan.  I share that he is scheduled for dialysis, and he states understanding.  It seems that his creatinine has been trending up and is now in the fives.  We talked about his diarrhea, and he states that he feels that this is improving.  We talked about healthcare power of attorney, see below. PMT unable to speak with Mr. Evitt about CODE STATUS today.  He seems to drift off to sleep occasionally during our conversation.  Today's visit used for relationship building.  Questions and concerns were addressed.  PMT to continue to follow  Conference with attending, bedside nursing staff, transition of  care team related to patient condition, needs, goals of care.   HCPOA   NEXT OF KIN -Mr. Wombles and I talked about the importance of having a healthcare surrogate available to speak for him if he is unable.  He names his 13 year old daughter, Jordan Hawks as his healthcare surrogate.  He is unable to give me her phone number at this time. He has [name] listed as an emergency contact and shares that she is the mother of his youngest child who is age 42.     SUMMARY OF RECOMMENDATIONS   At this point full scope/full code PMT to hold CODE STATUS discussions with Mr. Litts after HD HD now scheduled for 12/28   Code Status/Advance Care Planning:  Full code -time for relationship building, CODE STATUS not discussed with Mr. Casillas today.  His creatinine has increased daily, and he will have dialysis this afternoon.  PMT to have CODE STATUS discussions with Mr. Lundblad on next visit.  Symptom Management:   Per hospitalist, no additional needs at this time.  Palliative Prophylaxis:   Frequent Pain Assessment and Oral Care  Additional Recommendations (Limitations, Scope, Preferences):  Full Scope Treatment  Psycho-social/Spiritual:  Desire for further Chaplaincy support:no  Additional Recommendations: Caregiving  Support/Resources  Prognosis:   Unable to determine, based on outcomes.  High mortality in the first few months after starting hemodialysis.  Complicated by 2 hospital stays and 1 ED visit in 6 months, and extended (24 days) stay during this hospitalization.  Discharge Planning: To be determined, based on outcomes.  Would likely benefit from short-term rehab.      Primary Diagnoses: Present on Admission: . Syncope due to orthostatic hypotension   I have reviewed the medical record, interviewed the patient and family, and examined the patient. The following aspects are pertinent.  Past Medical History:  Diagnosis Date  . Diabetes mellitus, type 2 ([place])   . ESRD on  hemodialysis ([place])   . GI bleed   . Orthostatic hypotension   . PAF (paroxysmal atrial fibrillation) ([facility])    Social History   Socioeconomic History  . Marital status: Single    Spouse name: Not on file  . Number of children: Not on file  . Years of education: Not on file  . Highest education level: Not on file  Occupational History  . Not on file  Tobacco Use  . Smoking status: Never Smoker  . Smokeless tobacco: Never Used  Vaping Use  . Vaping Use: Never used  Substance and Sexual Activity  . Alcohol use: Yes    Comment: occasional  . Drug use: Never  . Sexual activity: Not on file  Other Topics Concern  . Not on file  Social History Narrative   ** Merged History Encounter **       Social Determinants of Health   Financial Resource Strain: Not on file  Food Insecurity: Not on file  Transportation Needs: Not on file  Physical Activity: Not on file  Stress: Not on file  Social Connections: Not on file   Family History  Problem Relation Age of Onset  . Hypertension Father    Scheduled Meds: . amiodarone  200 mg Oral Daily  . bismuth subsalicylate  30 mL Oral Q6H  . calcitRIOL  0.5 mcg Oral Daily  . Chlorhexidine Gluconate Cloth  6 each Topical Q0600  . Chlorhexidine Gluconate Cloth  6 each Topical Q0600  . darbepoetin (ARANESP) injection - DIALYSIS  200 mcg Intravenous Q Mon-HD  . famotidine  20 mg Oral Daily  . feeding supplement  237 mL Oral BID BM  . finasteride  5 mg Oral Daily  . fludrocortisone  0.2 mg Oral Daily  . loperamide  2 mg Oral TID  . metoprolol tartrate  12.5 mg Oral BID  . midodrine  10 mg Oral TID with meals  . multivitamin  1 tablet Oral QHS  . pyridostigmine  60 mg Oral Daily  . simethicone  80 mg Oral QID  . simvastatin  20 mg Oral QPM  . sodium chloride flush  3 mL Intravenous Q12H   Continuous Infusions: . sodium chloride    . sodium chloride    . sodium chloride     PRN Meds:.sodium chloride, sodium chloride, sodium  chloride, acetaminophen **OR** acetaminophen, chlorproMAZINE, heparin, heparin, lidocaine (PF), lidocaine-prilocaine, ondansetron **OR** ondansetron (ZOFRAN) IV, pentafluoroprop-tetrafluoroeth, sodium chloride flush Medications Prior to Admission:  Prior to Admission medications   Medication Sig Start Date End Date Taking? Authorizing Provider  amiodarone (PACERONE) 200 MG tablet Take 200 mg by mouth daily. 10/01/20  Yes [provider]  metoprolol tartrate (LOPRESSOR) 25 MG tablet Take 0.5 tablets (12.5 mg total) by mouth  2 (two) times daily. 11/01/20  Yes Dessa Phi, DO  midodrine (PROAMATINE) 10 MG tablet Take 1 tablet (10 mg total) by mouth with breakfast, with lunch, and with evening meal. 11/01/20  Yes Dessa Phi, DO  ondansetron (ZOFRAN) 4 MG tablet Take 4 mg by mouth every 6 (six) hours as needed for nausea.  10/09/20  Yes [provider]  pantoprazole (PROTONIX) 40 MG tablet Take 40 mg by mouth daily.   Yes [provider]  promethazine (PHENERGAN) 25 MG/ML injection Inject 25 mg into the muscle every 4 (four) hours as needed for nausea or vomiting.   Yes [provider]  simvastatin (ZOCOR) 20 MG tablet Take 20 mg by mouth every evening. 04/30/18  Yes [provider]   No Known Allergies Review of Systems  Unable to perform ROS: Mental status change    Physical Exam Vitals and nursing note reviewed.  Constitutional:      General: He is not in acute distress.    Appearance: He is ill-appearing.  HENT:     Head: Normocephalic and atraumatic.     Mouth/Throat:     Mouth: Mucous membranes are moist.  Pulmonary:     Effort: Pulmonary effort is normal. No respiratory distress.  Abdominal:     General: Abdomen is flat.  Skin:    General: Skin is warm and dry.  Neurological:     Mental Status: He is alert and oriented to person, place, and time.  Psychiatric:        Mood and Affect: Mood normal.        Behavior: Behavior normal.      Vital Signs: BP 131/69   Pulse 71   Temp 98.8 F (37.1 C) (Oral)   Resp 18   Ht 6' 2"  (1.88 m)   Wt 66 kg   SpO2 98%   BMI 18.68 kg/m  Pain Scale: 0-10 POSS *See Group Information*: 1-Acceptable,Awake and alert Pain Score: 0-No pain   SpO2: SpO2: 98 % O2 Device:SpO2: 98 % O2 Flow Rate: .O2 Flow Rate (L/min): 2 L/min  IO: Intake/output summary:   Intake/Output Summary (Last 24 hours) at 11/26/2020 1326 Last data filed at 11/26/2020 7342 Gross per 24 hour  Intake 680 ml  Output 400 ml  Net 280 ml    LBM: Last BM Date: 11/26/20 Baseline Weight: Weight: 74.8 kg Most recent weight: Weight: 66 kg     Palliative Assessment/Data:   Flowsheet Rows   Flowsheet Row Most Recent Value  Intake Tab   Referral Department Hospitalist  Unit at Time of Referral Cardiac/Telemetry Unit  Palliative Care Primary Diagnosis Other (Comment)  Date Notified 11/25/20  Palliative Care Type New Palliative care  Reason Not Seen Consult cancelled  Reason for referral Clarify Goals of Care  Date of Admission 11/02/20  Date first seen by Palliative Care 11/26/20  # of days Palliative referral response time 1 Day(s)  # of days IP prior to Palliative referral 23  Clinical Assessment   Palliative Performance Scale Score 40%  Pain Max last 24 hours Not able to report  Pain Min Last 24 hours Not able to report  Dyspnea Max Last 24 Hours Not able to report  Dyspnea Min Last 24 hours Not able to report  Psychosocial & Spiritual Assessment   Palliative Care Outcomes       Time In: 1140 Time Out: 1230 Time Total: 50 minutes  Greater than 50%  of this time was spent counseling and coordinating care  related to the above assessment and plan.  Signed by: Drue Novel, NP   Please contact Palliative Medicine Team phone at [phone] for questions and concerns.  For individual provider: See Shea Evans

## 2020-11-26 NOTE — Progress Notes (Signed)
   NEPHROLOGY NURSING NOTE:  Dialysis treatment has been rescheduled to 11/27/20 due to high patient census on 12/27 - per Dr. Joelyn Oms.  Rockwell Alexandria, RN

## 2020-11-26 NOTE — Progress Notes (Signed)
Subjective:  Patient has hiccups. No abdominal pain. Tolerating diet. BM 5 times over night. Large loose stools.   Objective: Vital signs in last 24 hours: Temp:  [98.4 F (36.9 C)-99.1 F (37.3 C)] 98.8 F (37.1 C) (12/27 0558) Pulse Rate:  [71-95] 71 (12/27 1200) Resp:  [16-18] 18 (12/27 0558) BP: (108-139)/(57-69) 131/69 (12/27 1200) SpO2:  [95 %-99 %] 98 % (12/27 0558) Weight:  [66 kg] 66 kg (12/27 0500) Last BM Date: 11/26/20 General:   Alert,  Well-developed, well-nourished, pleasant and cooperative in NAD Head:  Normocephalic and atraumatic. Eyes:  Sclera clear, no icterus.  Abdomen:  Soft, nontender and nondistended.  Abdominal binder in place. Normal bowel sounds, without guarding, and without rebound.   Extremities:  Without clubbing, deformity or edema. Neurologic:  Alert and  oriented x4;  grossly normal neurologically. Psych:  Alert and cooperative. Normal mood and affect.  Intake/Output from previous day: 12/26 0701 - 12/27 0700 In: 1043 [P.O.:720; I.V.:3; IV Piggyback:320] Out: 825 [Urine:825] Intake/Output this shift: No intake/output data recorded.  Lab Results: CBC Recent Labs    11/26/20 0539  WBC 15.4*  HGB 7.9*  HCT 26.0*  MCV 94.5  PLT 211   BMET Recent Labs    11/24/20 0721 11/25/20 0754 11/26/20 0539  NA 129* 128* 128*  K 3.8 4.2 3.8  CL 100 99 98  CO2 18* 18* 17*  GLUCOSE 136* 125* 116*  BUN 50* 56* 68*  CREATININE 4.12* 4.90* 5.73*  CALCIUM 7.7* 7.7* 8.0*   LFTs Recent Labs    11/24/20 0721 11/25/20 0754 11/26/20 0539  ALBUMIN 2.3* 2.6* 2.9*   Fasting AM cortisol 23.1.    No results for input(s): LIPASE in the last 72 hours. PT/INR No results for input(s): LABPROT, INR in the last 72 hours.    Imaging Studies: MR BRAIN WO CONTRAST  Result Date: 11/16/2020 CLINICAL DATA:  Question deposition disease. Hypotension. Altered mental status. EXAM: MRI HEAD WITHOUT CONTRAST TECHNIQUE: Multiplanar, multiecho pulse sequences  of the brain and surrounding structures were obtained without intravenous contrast. COMPARISON:  Head CT 01/25/2020 FINDINGS: Brain: Diffusion imaging does not show any acute or subacute infarction. No focal abnormality affects brainstem or cerebellum. Cerebral hemispheres show mild age related volume loss without evidence of small-vessel disease or large vessel infarction. No mass lesion, hemorrhage, hydrocephalus or extra-axial collection. Vascular: Major vessels at the base of the brain show flow. Skull and upper cervical spine: Negative Sinuses/Orbits: Clear/normal Other: None IMPRESSION: No acute or reversible finding. Mild age related volume loss. No evidence of small-vessel disease or large vessel infarction. Electronically Signed   By: Nelson Chimes M.D.   On: 11/16/2020 14:01   DG CHEST PORT 1 VIEW  Result Date: 10/29/2020 CLINICAL DATA:  Hypoxia, orthostatic hypotension, atrial fibrillation EXAM: PORTABLE CHEST 1 VIEW COMPARISON:  10/12/2020 FINDINGS: Single frontal view of the chest demonstrates right internal jugular dialysis catheter unchanged. Cardiac silhouette is stable. Interval development of bibasilar veiling opacities compatible with consolidation and/or effusion. There is central vascular congestion. No pneumothorax. No acute bony abnormalities. IMPRESSION: 1. Findings compatible with fluid overload and bilateral pleural effusions. Electronically Signed   By: Randa Ngo M.D.   On: 10/29/2020 20:00   DG Abd Portable 1V  Result Date: 11/15/2020 CLINICAL DATA:  Abdominal distension EXAM: PORTABLE ABDOMEN - 1 VIEW COMPARISON:  10/25/2020 FINDINGS: The subdiaphragmatic region and right flank are excluded from view. Multiple gas-filled prominent loops of large and small bowel are seen throughout the visualized abdomen  in keeping with un underlying ileus. No gross free intraperitoneal gas. Gas and stool is seen within the rectal vault. No organomegaly. No acute bone abnormality.  IMPRESSION: Mild ileus. Electronically Signed   By: Fidela Salisbury MD   On: 11/15/2020 04:15   CT Angio Abd/Pel w/ and/or w/o  Result Date: 11/07/2020 CLINICAL DATA:  Evaluate for mesenteric ischemia. End-stage renal disease on dialysis. Bowel wall thickening on exam from 10/12/2020. EXAM: CTA ABDOMEN AND PELVIS WITHOUT AND WITH CONTRAST TECHNIQUE: Multidetector CT imaging of the abdomen and pelvis was performed using the standard protocol during bolus administration of intravenous contrast. Multiplanar reconstructed images and MIPs were obtained and reviewed to evaluate the vascular anatomy. CONTRAST:  20mL OMNIPAQUE IOHEXOL 350 MG/ML SOLN COMPARISON:  10/22/2020 FINDINGS: VASCULAR Aorta: Mild atherosclerotic disease in the abdominal aorta without aneurysm or dissection. Celiac: Celiac trunk is patent without significant stenosis. Focal dilatation of the trunk measures up to 9 mm. Main branch vessels are patent. SMA: Replaced right hepatic artery. SMA is widely patent without significant stenosis. No evidence for aneurysm or dissection. Renals: Main right renal artery is widely patent without stenosis, aneurysm or dissection. There is small accessory right renal artery. High-grade focal narrowing in the proximal main left renal artery without significant plaque in this area. Findings could be related to a dissection but indeterminate. Small accessory left renal artery is patent. IMA: Patent Inflow: Atherosclerotic disease involving the proximal left common iliac artery without significant stenosis. Common iliac arteries are patent bilaterally. Disease and stenosis involving the right internal iliac artery. Left internal iliac artery is patent. Bilateral external iliac arteries are widely patent. Proximal Outflow: Proximal femoral arteries are patent bilaterally. Veins: Main portal venous system is patent. Limited evaluation of the superior cavoatrial junction on the arterial phase imaging due to non-opacified  blood in this area. IVC and renal veins are patent. Narrowing of the left common iliac vein from the right common iliac artery is a normal anatomic variant. Review of the MIP images confirms the above findings. NON-VASCULAR Lower chest: Small bilateral pleural effusions with compressive atelectasis. Hepatobiliary: High-density material in the gallbladder are suggestive for small stones. The gallbladder is moderately distended without definite inflammatory changes. There is colon anterior to the liver. No discrete liver lesion. No biliary dilatation. Gallbladder distension has minimally changed since the previous examination. Pancreas: Unremarkable. No pancreatic ductal dilatation or surrounding inflammatory changes. Spleen: Normal in size without focal abnormality. Adrenals/Urinary Tract: Normal appearance of the adrenal glands. Both kidneys are small with scattered calcifications. Findings are suggestive for nonobstructive renal calculi. Stomach/Bowel: Normal appearance of the stomach and duodenum. There is a rectal catheter with an inflated balloon. Sigmoid colon is moderately distended with mild wall thickening. However, the sigmoid wall thickening is less impressive on the venous phase imaging. Cecum is distended and contains a large amount of stool. Transverse colon is distended with gas. Lymphatic: No significant lymph node enlargement in the abdomen or pelvis. Reproductive: Stable appearance of the prostate. Other: Diffuse subcutaneous edema. Small amount of pelvic ascites which is new. Trace ascites in the left lower quadrant of the abdomen. Trace ascites in the upper abdomen. Negative for free air. Musculoskeletal: Chronic disc space narrowing with endplate changes at Z3-G6. Stable disc space narrowing at L5-S1. IMPRESSION: VASCULAR 1. Atherosclerotic disease in the abdominal aorta without aneurysm or significant stenosis. Aortic Atherosclerosis (ICD10-I70.0). 2. Main mesenteric arteries are patent without  significant stenosis. No evidence to suggest chronic mesenteric ischemia. 3. Multiple renal arteries as described.  Focal narrowing the proximal left main renal artery that could be related to atherosclerotic disease or focal dissection in this area. NON-VASCULAR 1. The sigmoid colon has been partially decompressed since 10/22/2020 and placement of the rectal tube. There continues to be gaseous distension of the sigmoid colon and transverse colon. Mild wall thickening in the sigmoid colon is nonspecific but may be related to the partial decompression rather than infectious or inflammatory process. Findings are suggestive for an underlying colonic ileus. 2. Bilateral kidneys are atrophic with bilateral calcifications. Findings are compatible with history of end-stage renal disease. Calcifications could represent nonobstructive renal calculi. 3. Diffuse subcutaneous edema with small amount of ascites, most prominent in the pelvis. 4. Significant disc space disease at L2-L3. 5. Bilateral pleural effusions with compressive atelectasis at the lung bases. 6. Cholelithiasis with moderate gallbladder distension. Electronically Signed   By: Markus Daft M.D.   On: 11/07/2020 17:58  [2 weeks]   Assessment: 70 year old male with several week history of diarrhea and rectal bleeding (bleeding has resolved), extensive work-up and prolonged hospitalizations.  Incomplete colonoscopy during recent admission, poor prep, blood noted in the rectum, sigmoid, descending colon, splenic flexure. Findings suspicious for segmental or ischemic colitis. Pathology without changes or features of IBD, query drug-induced or ischemic. CMV stains negative.  Stool studies negative multiple times.  He has not responded to empiric therapy with antibiotics, steroids, pancreatic enzymes, Colestid, octreotide.  Colonoscopy December 22 showing normal terminal ileum, multilobulated 15 millimeters polyp removed piecemeal from sigmoid colon, 2 polyps from  the hepatic flexure removed, random colon biopsies negative for microscopic colitis.  Extensive sigmoid colon ulceration.  Etiology felt to be ischemic. Pathology still pending.  Personally called [facility] pathology, specimen in process, stains pending.  Diarrhea: Patient reportedly had 1-2 stools daily over the past couple of days but overnight had 5 large loose stools again.  No overt GI bleeding reported.   Etiology remains unclear. ?worsening diarrhea in setting of pyridostigmine (has received two doses). Weight is down 15 pounds this admission.  Anemia appears to be multifactorial.  Has received 1 unit of packed red blood cells on December 22.  Hemoglobin went from 7.9-->9.0.  No evidence of ongoing overt GI bleeding.  Hemoglobin has trickled back down to 7.9.  Iron 9, ferritin significantly elevated at 1477.  Orthostatic hypotension.  Etiology felt to be autonomic dysfunction. Fasting a.m. cortisol level normal on repeat November 24, 2020.  He does not appear to be volume depleted at this time.  Management per attending.  Plan: 1. Continue loperamide 2 mg p.o. 3 times daily. 2. Continue Pepto-Bismol 30 cc every 6 hours. 3. Follow-up pending pathology. 4. Options are very limited from GI standpoint (unless pathology is revealing) and his persistent orthostatic hypotension may be unrelated. Consider transfer to tertiary care center for further diagnostic/managemetn purposes.   Laureen Ochs. Bernarda Caffey [facility] [phone] 12/27/20211:48 PM     LOS: 24 days

## 2020-11-26 NOTE — Progress Notes (Signed)
Patient ID: [name], male   DOB: 12/19/1950, 69 y.o.   MRN: [id number] S: No c/o this AM; to resume HD  O:BP 139/68 (BP Location: Left Arm)   Pulse 92   Temp 98.8 F (37.1 C) (Oral)   Resp 18   Ht 6\' 2"  (1.88 m)   Wt 66 kg   SpO2 98%   BMI 18.68 kg/m   Intake/Output Summary (Last 24 hours) at 11/26/2020 0927 Last data filed at 11/26/2020 0610 Gross per 24 hour  Intake 1043 ml  Output 825 ml  Net 218 ml   Intake/Output: I/O last 3 completed shifts: In: 6967 [P.O.:720; I.V.:3; IV Piggyback:320] Out: 8938 [Urine:1375]    Intake/Output this shift:  No intake/output data recorded. Weight change:  Gen: NAD CVS: RRR Resp: cta Abd: +BS, soft, NT/ND, abdominal binder was around his chest  Ext: trace pedal edema Foley in place  Recent Labs  Lab 11/20/20 0539 11/21/20 0911 11/22/20 0616 11/23/20 0503 11/24/20 0721 11/25/20 0754 11/26/20 0539  NA 128* 131* 132* 130* 129* 128* 128*  K 3.3* 3.8 3.9 3.7 3.8 4.2 3.8  CL 99 102 102 98 100 99 98  CO2 20* 19* 20* 20* 18* 18* 17*  GLUCOSE 105* 92 133* 100* 136* 125* 116*  BUN 45* 43* 41* 40* 50* 56* 68*  CREATININE 3.15* 3.13* 3.34* 3.35* 4.12* 4.90* 5.73*  ALBUMIN 1.8* 1.8* 1.9* 1.9* 2.3* 2.6* 2.9*  CALCIUM 7.2* 7.4* 7.4* 7.6* 7.7* 7.7* 8.0*  PHOS 3.9 3.8  --  3.2 5.0* 4.5 3.8   Liver Function Tests: Recent Labs  Lab 11/24/20 0721 11/25/20 0754 11/26/20 0539  ALBUMIN 2.3* 2.6* 2.9*   No results for input(s): LIPASE, AMYLASE in the last 168 hours. No results for input(s): AMMONIA in the last 168 hours. CBC: Recent Labs  Lab 11/20/20 0539 11/21/20 0911 11/21/20 2112 11/22/20 0616 11/23/20 0503 11/26/20 0539  WBC 7.3 6.0  --  5.9 7.0 15.4*  HGB 7.5* 7.9*   < > 9.0* 9.5* 7.9*  HCT 24.4* 25.5*   < > 29.7* 30.8* 26.0*  MCV 94.9 95.1  --  94.6 95.4 94.5  PLT 192 213  --  233 254 211   < > = values in this interval not displayed.   Cardiac Enzymes: No results for input(s): CKTOTAL, CKMB, CKMBINDEX, TROPONINI  in the last 168 hours. CBG: No results for input(s): GLUCAP in the last 168 hours.  Iron Studies:  Recent Labs    11/26/20 0539  IRON 9*  TIBC NOT CALCULATED  FERRITIN 1,477*   Studies/Results: No results found. Marland Kitchen amiodarone  200 mg Oral Daily  . bismuth subsalicylate  30 mL Oral Q6H  . calcitRIOL  0.5 mcg Oral Daily  . Chlorhexidine Gluconate Cloth  6 each Topical Q0600  . Chlorhexidine Gluconate Cloth  6 each Topical Q0600  . darbepoetin (ARANESP) injection - DIALYSIS  200 mcg Intravenous Q Mon-HD  . famotidine  20 mg Oral Daily  . feeding supplement  237 mL Oral BID BM  . finasteride  5 mg Oral Daily  . fludrocortisone  0.2 mg Oral Daily  . loperamide  2 mg Oral TID  . metoprolol tartrate  12.5 mg Oral BID  . midodrine  10 mg Oral TID with meals  . multivitamin  1 tablet Oral QHS  . pyridostigmine  60 mg Oral Daily  . simethicone  80 mg Oral QID  . simvastatin  20 mg Oral QPM  . sodium chloride flush  3 mL Intravenous Q12H    BMET    Component Value Date/Time   NA 128 (L) 11/26/2020 0539   K 3.8 11/26/2020 0539   CL 98 11/26/2020 0539   CO2 17 (L) 11/26/2020 0539   GLUCOSE 116 (H) 11/26/2020 0539   BUN 68 (H) 11/26/2020 0539   CREATININE 5.73 (H) 11/26/2020 0539   CALCIUM 8.0 (L) 11/26/2020 0539   GFRNONAA 10 (L) 11/26/2020 0539   GFRAA 19 (L) 06/15/2018 0407   CBC    Component Value Date/Time   WBC 15.4 (H) 11/26/2020 0539   RBC 2.75 (L) 11/26/2020 0539   HGB 7.9 (L) 11/26/2020 0539   HCT 26.0 (L) 11/26/2020 0539   PLT 211 11/26/2020 0539   MCV 94.5 11/26/2020 0539   MCH 28.7 11/26/2020 0539   MCHC 30.4 11/26/2020 0539   RDW 19.3 (H) 11/26/2020 0539   LYMPHSABS 0.5 (L) 11/02/2020 1209   MONOABS 1.2 (H) 11/02/2020 1209   EOSABS 0.0 11/02/2020 1209   BASOSABS 0.1 11/02/2020 1209   Assessment/Plan:  1. RecurrentSyncope/hypotension; sig orthostasis/autonomic dysfunction:On TID Midodrine and florinef. HD with minimal/no UFdue to good  UOP.Keeping net even on HD. 1. Pleasefollow orthostatics and ambulate with PT 2. Midodrine 10mg  TID 3. Maintain abd binder and compression stockings 4. florinef increased to 0.2 daily 12/20 5. Added pyridostigmine 60 mg 12/26 to current regimen as he is still profoundly orthostatic. 2. Colitis- Ranee Gosselin been following and he completed a course ofcipro/flagyl. He had colonoscopy on 10/17/20 -did noteblood in the rectum and sigmoid colon, in the descending colon and at the splenic flexure; colitis involving descending and sigmoid colons seen. Biopsies performed. Per GI and surgery. Has had extensive work up and interventions however diagnosis is still elusive. 5-HIAA levels WNL.Colonoscopy 12/22 showed large sigmoid polyp and 2 from hepatic flexure. Biopsies taken and pending.  3. Urinary retention- this is new. Pt with significant UOP.Improved withfoley catheter andmay need outpatientUrology evaluation. Unclear if this is drug related or due to BPH 4. [facility]. 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 1. No amyloid deposits reported. 2. Does not have sufficient GFR to stay off HD after trial; resuming todya 5. Anemia:of CKD, possibly some GIB- s/p blood transfusion. No heparin with HD. GI on board, colonoscopyfindings as above. Rec 1u prbc 11/22 1. Transfuse as needed to keep Hgb >7 6. On Aranesp weekly- inc dose- iron stores low on 12/10 and repleted. IV Fe 7. CKD-MBD: calcitriol 0.13mcg daily. P ok w/o binder 8. Severe protein malnutrition:per primary, push protein 9. Vascular access- will need to f/u with Dr. Donnetta Hutching once stable for discharge. 10. A fib- on amiodarone and eliquis (off A/C as of right now) 11. Hyponatremia. Stable and  asymptomatic.   12. Disposition- pending ability to safely ambulate before discharge and GI clearance  Rexene Agent  [name]

## 2020-11-26 NOTE — Progress Notes (Signed)
PROGRESS NOTE   [name]  KZS:[id number] DOB: October 13, 1951 DOA: 11/02/2020 PCP: Patient, No Pcp Per   Chief Complaint  Patient presents with  . Loss of Consciousness    Brief Admission History:  69 y.o. male ESRD on HD MWF, DM-2, atrial fibrillation, HLD-recent hospitalization for colitis/diarrhea-presented to the hospital for evaluation of syncope due to orthostatic hypotension.  Hospital course complicated by ongoing diarrhea and persistent orthostatic hypotension. Assessment & Plan:   Active Problems:   Syncope due to orthostatic hypotension   ESRD on hemodialysis (HCC)   Colitis  Orthostatic Syncope - Pt has been improving after starting midodrine, florinef, LE stockings and abdominal binder.  Continue working with PT.   Added IV albumin per Dr. Laural Golden 12/24 to increase oncotic pressures. pyridostygmine started by GI on 12/26.   Chronic diarrhea -improving, rectal tube has been removed, diarrhea has been improving.  S/p colonoscopy 12/22.  Awaiting biopsy results from colonoscopy.   Stool frequency is improving and slowing down.   ESRD on HD -  HD was temporarily on hold per nephrology service but has been restarted due to increasing creatinine.  HD to resume 12/27.      PAF - stable on amiodarone/metoprolol.  Apixaban was held by cardiology due to recent rectal bleeding and acute blood loss anemia.  Anticoagulation to be addressed by outpatient cardiology follow up.   Anemia in CKD - stable, following.   HLD - resume home statin therapy.   Acute urinary retention - s/p foley placement 12/16.  Foley removed 12/23 and he had been voiding.  Unfortunately he developed acute urinary retention again on 12/25 and foley was replaced with >900 mL urine relieved.  Will leave foley in place for now until he can follow up outpatient with urology.    DVT prophylaxis:  Full  Code Status: full  Family Communication: Pt prefers to convey to family healthcare info Disposition:  SNF when  medically cleared by GI service and renal service regarding his severe orthostatic hypotension.   Status is: Inpatient  Remains inpatient appropriate because:Inpatient level of care appropriate due to severity of illness. Awaiting clearance from GI for discharge to SNF.     Dispo:  Patient From: [facility]  Planned Disposition: [facility]  Expected discharge date: 11/26/2020  Medically stable for discharge: No  Consultants:   GI   Nephrology  cardiology  Procedures:  11/17>> colon biopsy: Severely active chronic nonspecific colitis with ulceration-negative for granulomas or dysplasia.  Stains for CMV negative.  Antimicrobials:  Ciprofloxacin: 11/13>> 11/22 Flagyl: 11/13>> 11/22   Subjective: Pt says he doesn't feel well and constant hiccups, thorazine only minimally helpful   Objective: Vitals:   11/25/20 2027 11/26/20 0500 11/26/20 0558 11/26/20 1200  BP: 135/62  139/68 131/69  Pulse: 95  92 71  Resp: 18  18   Temp: 98.4 F (36.9 C)  98.8 F (37.1 C)   TempSrc: Oral  Oral   SpO2: 95%  98%   Weight:  66 kg    Height:        Intake/Output Summary (Last 24 hours) at 11/26/2020 1258 Last data filed at 11/26/2020 0610 Gross per 24 hour  Intake 680 ml  Output 400 ml  Net 280 ml   Filed Weights   11/21/20 0600 11/24/20 0543 11/26/20 0500  Weight: 68.3 kg 66.2 kg 66 kg   Examination:  General exam: chronically ill appearing male, Appears calm and comfortable  Respiratory system: Clear to auscultation. Respiratory  effort normal. Cardiovascular system: S1 & S2 heard. No JVD, murmurs, rubs, gallops or clicks. No pedal edema. Gastrointestinal system: Abdomen is nondistended, soft and nontender. No organomegaly or masses felt. Normal bowel sounds heard. GU: foley in place draining amber urine Central nervous system: Alert and oriented. No focal neurological deficits. Extremities: Symmetric 5 x 5 power. Skin: No rashes, lesions or  ulcers Psychiatry: Judgement and insight appear normal. Mood & affect appropriate.   Data Reviewed: I have personally reviewed following labs and imaging studies  CBC: Recent Labs  Lab 11/20/20 0539 11/21/20 0911 11/21/20 2112 11/22/20 0616 11/23/20 0503 11/26/20 0539  WBC 7.3 6.0  --  5.9 7.0 15.4*  HGB 7.5* 7.9* 8.9* 9.0* 9.5* 7.9*  HCT 24.4* 25.5* 29.7* 29.7* 30.8* 26.0*  MCV 94.9 95.1  --  94.6 95.4 94.5  PLT 192 213  --  233 254 258    Basic Metabolic Panel: Recent Labs  Lab 11/20/20 0539 11/21/20 0911 11/22/20 0616 11/23/20 0503 11/24/20 0721 11/25/20 0754 11/26/20 0539  NA 128* 131* 132* 130* 129* 128* 128*  K 3.3* 3.8 3.9 3.7 3.8 4.2 3.8  CL 99 102 102 98 100 99 98  CO2 20* 19* 20* 20* 18* 18* 17*  GLUCOSE 105* 92 133* 100* 136* 125* 116*  BUN 45* 43* 41* 40* 50* 56* 68*  CREATININE 3.15* 3.13* 3.34* 3.35* 4.12* 4.90* 5.73*  CALCIUM 7.2* 7.4* 7.4* 7.6* 7.7* 7.7* 8.0*  MG 1.9  --   --   --   --   --   --   PHOS 3.9 3.8  --  3.2 5.0* 4.5 3.8    GFR: Estimated Creatinine Clearance: 11.2 mL/min (A) (by C-G formula based on SCr of 5.73 mg/dL (H)).  Liver Function Tests: Recent Labs  Lab 11/22/20 0616 11/23/20 0503 11/24/20 0721 11/25/20 0754 11/26/20 0539  ALBUMIN 1.9* 1.9* 2.3* 2.6* 2.9*    CBG: No results for input(s): GLUCAP in the last 168 hours.  Recent Results (from the past 240 hour(s))  Gastrointestinal Panel by PCR , Stool     Status: None   Collection Time: 11/21/20  3:46 PM   Specimen: Stool  Result Value Ref Range Status   Campylobacter species NOT DETECTED NOT DETECTED Final   Plesimonas shigelloides NOT DETECTED NOT DETECTED Final   Salmonella species NOT DETECTED NOT DETECTED Final   Yersinia enterocolitica NOT DETECTED NOT DETECTED Final   Vibrio species NOT DETECTED NOT DETECTED Final   Vibrio cholerae NOT DETECTED NOT DETECTED Final   Enteroaggregative E coli (EAEC) NOT DETECTED NOT DETECTED Final   Enteropathogenic E coli  (EPEC) NOT DETECTED NOT DETECTED Final   Enterotoxigenic E coli (ETEC) NOT DETECTED NOT DETECTED Final   Shiga like toxin producing E coli (STEC) NOT DETECTED NOT DETECTED Final   Shigella/Enteroinvasive E coli (EIEC) NOT DETECTED NOT DETECTED Final   Cryptosporidium NOT DETECTED NOT DETECTED Final   Cyclospora cayetanensis NOT DETECTED NOT DETECTED Final   Entamoeba histolytica NOT DETECTED NOT DETECTED Final   Giardia lamblia NOT DETECTED NOT DETECTED Final   Adenovirus F40/41 NOT DETECTED NOT DETECTED Final   Astrovirus NOT DETECTED NOT DETECTED Final   Norovirus GI/GII NOT DETECTED NOT DETECTED Final   Rotavirus A NOT DETECTED NOT DETECTED Final   Sapovirus (I, II, IV, and V) NOT DETECTED NOT DETECTED Final    Comment: Performed at [facility], [address] [place], [place] [address]  SARS Coronavirus 2 by RT PCR (hospital order, performed  in [place] lab) Nasopharyngeal Nasopharyngeal Swab     Status: None   Collection Time: 11/22/20 10:35 AM   Specimen: Nasopharyngeal Swab  Result Value Ref Range Status   SARS Coronavirus 2 NEGATIVE NEGATIVE Final    Comment: (NOTE) SARS-CoV-2 target nucleic acids are NOT DETECTED.  The SARS-CoV-2 RNA is generally detectable in upper and lower respiratory specimens during the acute phase of infection. The lowest concentration of SARS-CoV-2 viral copies this assay can detect is 250 copies / mL. A negative result does not preclude SARS-CoV-2 infection and should not be used as the sole basis for treatment or other patient management decisions.  A negative result may occur with improper specimen collection / handling, submission of specimen other than nasopharyngeal swab, presence of viral mutation(s) within the areas targeted by this assay, and inadequate number of viral copies (<250 copies / mL). A negative result must be combined with clinical observations, patient history, and epidemiological information.  Fact Sheet  for Patients:   StrictlyIdeas.no  Fact Sheet for Healthcare Providers: BankingDealers.co.za  This test is not yet approved or  cleared by the [place] FDA and has been authorized for detection and/or diagnosis of SARS-CoV-2 by FDA under an Emergency Use Authorization (EUA).  This EUA will remain in effect (meaning this test can be used) for the duration of the COVID-19 declaration under Section 564(b)(1) of the Act, 21 U.S.C. section 360bbb-3(b)(1), unless the authorization is terminated or revoked sooner.  Performed at [facility], [address], [place], [place] [address]      Radiology Studies: No results found.  Scheduled Meds: . amiodarone  200 mg Oral Daily  . bismuth subsalicylate  30 mL Oral Q6H  . calcitRIOL  0.5 mcg Oral Daily  . Chlorhexidine Gluconate Cloth  6 each Topical Q0600  . Chlorhexidine Gluconate Cloth  6 each Topical Q0600  . darbepoetin (ARANESP) injection - DIALYSIS  200 mcg Intravenous Q Mon-HD  . famotidine  20 mg Oral Daily  . feeding supplement  237 mL Oral BID BM  . finasteride  5 mg Oral Daily  . fludrocortisone  0.2 mg Oral Daily  . loperamide  2 mg Oral TID  . metoprolol tartrate  12.5 mg Oral BID  . midodrine  10 mg Oral TID with meals  . multivitamin  1 tablet Oral QHS  . pyridostigmine  60 mg Oral Daily  . simethicone  80 mg Oral QID  . simvastatin  20 mg Oral QPM  . sodium chloride flush  3 mL Intravenous Q12H   Continuous Infusions: . sodium chloride    . sodium chloride    . sodium chloride      LOS: 24 days   Time spent: 35 mins   Haward Pope Wynetta Emery, MD How to contact the [facility] Attending or Consulting provider [place] or covering provider during after hours [place], for this patient?  1. Check the care team in [facility] and look for a) attending/consulting [facility] provider listed and b) the [facility] team listed 2. Log into [email or web address] and use [place]'s universal password to access. If you  do not have the password, please contact the hospital operator. 3. Locate the [facility] provider you are looking for under [facility] and page to a number that you can be directly reached. 4. If you still have difficulty reaching the provider, please page the [facility] (Director on Call) for the Hospitalists listed on amion for assistance.  11/26/2020, 12:58 PM

## 2020-11-27 DIAGNOSIS — Z7189 Other specified counseling: Secondary | ICD-10-CM | POA: Diagnosis not present

## 2020-11-27 DIAGNOSIS — D123 Benign neoplasm of transverse colon: Secondary | ICD-10-CM | POA: Diagnosis not present

## 2020-11-27 DIAGNOSIS — K529 Noninfective gastroenteritis and colitis, unspecified: Secondary | ICD-10-CM | POA: Diagnosis not present

## 2020-11-27 DIAGNOSIS — R197 Diarrhea, unspecified: Secondary | ICD-10-CM | POA: Diagnosis not present

## 2020-11-27 DIAGNOSIS — Z515 Encounter for palliative care: Secondary | ICD-10-CM | POA: Diagnosis not present

## 2020-11-27 DIAGNOSIS — N186 End stage renal disease: Secondary | ICD-10-CM | POA: Diagnosis not present

## 2020-11-27 DIAGNOSIS — D122 Benign neoplasm of ascending colon: Secondary | ICD-10-CM | POA: Diagnosis not present

## 2020-11-27 LAB — RENAL FUNCTION PANEL
Albumin: 2.5 g/dL — ABNORMAL LOW (ref 3.5–5.0)
Anion gap: 13 (ref 5–15)
BUN: 75 mg/dL — ABNORMAL HIGH (ref 8–23)
CO2: 18 mmol/L — ABNORMAL LOW (ref 22–32)
Calcium: 8 mg/dL — ABNORMAL LOW (ref 8.9–10.3)
Chloride: 98 mmol/L (ref 98–111)
Creatinine, Ser: 6.52 mg/dL — ABNORMAL HIGH (ref 0.61–1.24)
GFR, Estimated: 9 mL/min — ABNORMAL LOW (ref >60)
Glucose, Bld: 87 mg/dL (ref 70–99)
Phosphorus: 4.6 mg/dL (ref 2.5–4.6)
Potassium: 3.7 mmol/L (ref 3.5–5.1)
Sodium: 129 mmol/L — ABNORMAL LOW (ref 135–145)

## 2020-11-27 LAB — CBC
HCT: 23.6 % — ABNORMAL LOW (ref 39.0–52.0)
Hemoglobin: 7.5 g/dL — ABNORMAL LOW (ref 13.0–17.0)
MCH: 29.5 pg (ref 26.0–34.0)
MCHC: 31.8 g/dL (ref 30.0–36.0)
MCV: 92.9 fL (ref 80.0–100.0)
Platelets: 165 10*3/uL (ref 150–400)
RBC: 2.54 MIL/uL — ABNORMAL LOW (ref 4.22–5.81)
RDW: 19.5 % — ABNORMAL HIGH (ref 11.5–15.5)
WBC: 14.3 10*3/uL — ABNORMAL HIGH (ref 4.0–10.5)
nRBC: 0 % (ref 0.0–0.2)

## 2020-11-27 LAB — SURGICAL PATHOLOGY

## 2020-11-27 MED ORDER — ALTEPLASE 2 MG IJ SOLR
4.0000 mg | Freq: Once | INTRAMUSCULAR | Status: AC
  Administered 2020-11-27: 4 mg

## 2020-11-27 MED ORDER — PSYLLIUM 95 % PO PACK
1.0000 | PACK | Freq: Every day | ORAL | Status: DC
  Administered 2020-11-28 – 2020-12-10 (×9): 1 via ORAL
  Filled 2020-11-27 (×13): qty 1

## 2020-11-27 NOTE — Progress Notes (Signed)
Patient ID: [name], male   DOB: [birth year], 69 y.o.   MRN: [id number] S:   No c/o this AM;   For HD today  SCr cont to inc, K is ok  Diarrhea improved/stable, GI following  No orthostatics since starting pyridostigmine  O:BP 127/69 (BP Location: Right Arm)   Pulse 64   Temp 98.4 F (36.9 C) (Oral)   Resp 18   Ht 6\' 2"  (1.88 m)   Wt 65.7 kg   SpO2 99%   BMI 18.60 kg/m   Intake/Output Summary (Last 24 hours) at 11/27/2020 0910 Last data filed at 11/27/2020 0500 Gross per 24 hour  Intake 600 ml  Output 1550 ml  Net -950 ml   Intake/Output: I/O last 3 completed shifts: In: 840 [P.O.:840] Out: 1951 [DVVOH:6073]    Intake/Output this shift:  No intake/output data recorded. Weight change: -0.3 kg Gen: NAD CVS: RRR Resp: cta Abd: +BS, soft, NT/ND, abdominal binder was around his chest  Ext: trace pedal edema Foley in place  Recent Labs  Lab 11/21/20 0911 11/22/20 0616 11/23/20 0503 11/24/20 0721 11/25/20 0754 11/26/20 0539 11/27/20 0514  NA 131* 132* 130* 129* 128* 128* 129*  K 3.8 3.9 3.7 3.8 4.2 3.8 3.7  CL 102 102 98 100 99 98 98  CO2 19* 20* 20* 18* 18* 17* 18*  GLUCOSE 92 133* 100* 136* 125* 116* 87  BUN 43* 41* 40* 50* 56* 68* 75*  CREATININE 3.13* 3.34* 3.35* 4.12* 4.90* 5.73* 6.52*  ALBUMIN 1.8* 1.9* 1.9* 2.3* 2.6* 2.9* 2.5*  CALCIUM 7.4* 7.4* 7.6* 7.7* 7.7* 8.0* 8.0*  PHOS 3.8  --  3.2 5.0* 4.5 3.8 4.6   Liver Function Tests: Recent Labs  Lab 11/25/20 0754 11/26/20 0539 11/27/20 0514  ALBUMIN 2.6* 2.9* 2.5*   No results for input(s): LIPASE, AMYLASE in the last 168 hours. No results for input(s): AMMONIA in the last 168 hours. CBC: Recent Labs  Lab 11/21/20 0911 11/21/20 2112 11/22/20 0616 11/23/20 0503 11/26/20 0539  WBC 6.0  --  5.9 7.0 15.4*  HGB 7.9*   < > 9.0* 9.5* 7.9*  HCT 25.5*   < > 29.7* 30.8* 26.0*  MCV 95.1  --  94.6 95.4 94.5  PLT 213  --  233 254 211   < > = values in this interval not displayed.   Cardiac  Enzymes: No results for input(s): CKTOTAL, CKMB, CKMBINDEX, TROPONINI in the last 168 hours. CBG: No results for input(s): GLUCAP in the last 168 hours.  Iron Studies:  Recent Labs    11/26/20 0539  IRON 9*  TIBC NOT CALCULATED  FERRITIN 1,477*   Studies/Results: No results found. Marland Kitchen amiodarone  200 mg Oral Daily  . bismuth subsalicylate  30 mL Oral Q6H  . calcitRIOL  0.5 mcg Oral Daily  . Chlorhexidine Gluconate Cloth  6 each Topical Q0600  . Chlorhexidine Gluconate Cloth  6 each Topical Q0600  . darbepoetin (ARANESP) injection - DIALYSIS  200 mcg Intravenous Q Mon-HD  . famotidine  20 mg Oral Daily  . feeding supplement  237 mL Oral BID BM  . finasteride  5 mg Oral Daily  . fludrocortisone  0.2 mg Oral Daily  . loperamide  2 mg Oral TID  . metoprolol tartrate  12.5 mg Oral BID  . midodrine  10 mg Oral TID with meals  . multivitamin  1 tablet Oral QHS  . pyridostigmine  60 mg Oral Daily  . simvastatin  20 mg Oral  QPM  . sodium chloride flush  3 mL Intravenous Q12H    BMET    Component Value Date/Time   NA 129 (L) 11/27/2020 0514   K 3.7 11/27/2020 0514   CL 98 11/27/2020 0514   CO2 18 (L) 11/27/2020 0514   GLUCOSE 87 11/27/2020 0514   BUN 75 (H) 11/27/2020 0514   CREATININE 6.52 (H) 11/27/2020 0514   CALCIUM 8.0 (L) 11/27/2020 0514   GFRNONAA 9 (L) 11/27/2020 0514   GFRAA 19 (L) 06/15/2018 0407   CBC    Component Value Date/Time   WBC 15.4 (H) 11/26/2020 0539   RBC 2.75 (L) 11/26/2020 0539   HGB 7.9 (L) 11/26/2020 0539   HCT 26.0 (L) 11/26/2020 0539   PLT 211 11/26/2020 0539   MCV 94.5 11/26/2020 0539   MCH 28.7 11/26/2020 0539   MCHC 30.4 11/26/2020 0539   RDW 19.3 (H) 11/26/2020 0539   LYMPHSABS 0.5 (L) 11/02/2020 1209   MONOABS 1.2 (H) 11/02/2020 1209   EOSABS 0.0 11/02/2020 1209   BASOSABS 0.1 11/02/2020 1209   Assessment/Plan:  1. RecurrentSyncope/hypotension; sig orthostasis/autonomic dysfunction:HD with minimal/no UFdue to good  UOP.Keeping net even on HD. 1. Needs orthostatics 2. Cont Midodrine, florinef, pyridostigmine 3. Maintain abd binder and compression stockings 2. Colitis- Ranee Gosselin been following and he completed a course ofcipro/flagyl. He had colonoscopy on 10/17/20 -did noteblood in the rectum and sigmoid colon, in the descending colon and at the splenic flexure; colitis involving descending and sigmoid colons seen. Biopsies performed. Per GI and surgery. Has had extensive work up and interventions however diagnosis is still elusive. 5-HIAA levels WNL.Colonoscopy 12/22 showed large sigmoid polyp and 2 from hepatic flexure. Biopsies taken and pending. GI following 3. Urinary retention- this is new. Pt with significant UOP.Improved withfoley catheter andmay need outpatientUrology evaluation. Unclear if this is drug related or due to BPH 4. [facility]. 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 1. No amyloid deposits reported. 2. Does not have sufficient GFR to stay off HD after trial; resuming 12/28 5. Anemia:of CKD, possibly some GIB- s/p blood transfusion. No heparin with HD. GI on board, colonoscopyfindings as above. Rec 1u prbc 11/22 1. Transfuse as needed to keep Hgb >7 6. On Aranesp weekly- inc dose- iron stores low on 12/10 and repleted. IV Fe 7. CKD-MBD: calcitriol 0.9mcg daily. P ok w/o binder 8. Severe protein malnutrition:per primary, push protein 9. Vascular access- will need to f/u with Dr. Donnetta Hutching once stable for discharge. 10. A fib- on amiodarone and eliquis (off A/C as of right now) 11. Hyponatremia. Mild, stable and asymptomatic.   12. Disposition- pending ability to safely ambulate before discharge and GI clearance  Rexene Agent  [employer]

## 2020-11-27 NOTE — Progress Notes (Signed)
Nutrition Follow-up  DOCUMENTATION CODES:   Not applicable  INTERVENTION:  Continue Ensure Enlive po BID, each supplement provides 350 kcal and 20 grams of protein (likes chocolate)  Continue double protein portions with breakfast and dinner meals  Magic cup BID with meals, each supplement provides 290 kcal and 9 grams of protein  Food preferences updated in [employer]  Encouraged po intake of meals and supplements  NUTRITION DIAGNOSIS:   Increased nutrient needs related to chronic illness (ESRD on HD) as evidenced by estimated needs. -ongoing  GOAL:   Patient will meet greater than or equal to 90% of their needs -addressing via ONS  MONITOR:   Labs,I & O's,Supplement acceptance,PO intake,Weight trends,Skin  REASON FOR ASSESSMENT:   Consult Assessment of nutrition requirement/status  ASSESSMENT:   69 year old male with history significant for DM2, CKD stage V recently started on HD, atrial fibrillation/flutter, recurrent orthostatic hypotension, HLD, who was recently hospitalized and discharged to SNF on 12/2 for colitis with associated adynamic ileus and acute blood loss anemia presented from facility after an episode of syncope while getting up to go to hemodialysis.  RD working remotely.  Briefly spoke with patient via phone, has the hiccups this morning. He reports eating a little bit of breakfast, recalls potatoes and eggs, says he does not like the sausage and requested it no longer served on breakfast tray. Patient reports drinking Ensure this morning, prefers chocolate. Per flowsheet, meal intake has been fair, eating 25-75% of meals (50% average x 7 documented meals 12/20-12/26) noted 0% po of lunch meal yesterday. He is drinking Ensure supplements ~50% of the time.  He continued to refuse [employer] supplements, therefore discontinued at last follow-up. RD has continued to educate patient on increased calorie/protein needs and reinforced the importance of eating  meals and drinking supplements. RD attempted to obtain additional food preferences, none offered. Will continue Ensure supplement BID as well as double protein portions and will add Magic Cup on meal trays.   Weights have trended down 5.28 lbs (3.5%) in the last 7 days which is significant for time frame.   I/Os: -17,072.6 ml since admit UOP: 1550 ml x 24 hours  Medications reviewed and include: Pepto bismol, Calcitriol, Aranesp, Pepcid, Ensure, Imodium, Mestinon, Zocor  Labs: Na 129 (L), BUN 75 (H), Cr 6.52 (H), WBC 15.4 (H), Hgb 7.9 (L), HCT 26 (L)  Per notes: -HD temporarily on hold, resume today d/t increasing Cr -Foley replaced 12/25 -Colonoscopy 12/22, awaiting biopsy results -chronic diarrhea improving, rectal tube removed -improving orthostatic syncope, pyridostygmine started on 12/26   Diet Order:   Diet Order            Diet Heart Room service appropriate? Yes; Fluid consistency: Thin  Diet effective now                 EDUCATION NEEDS:   Not appropriate for education at this time  Skin:  Skin Integrity Issues:: Stage II Stage II: sacrum (11/13)  Last BM:  12/14 (150 ml rectal tube)  Height:   Ht Readings from Last 1 Encounters:  11/02/20 6\' 2"  (1.88 m)    Weight:   Wt Readings from Last 1 Encounters:  11/27/20 65.7 kg    BMI:  Body mass index is 18.6 kg/m.  Estimated Nutritional Needs:   Kcal:  2127-2410  Protein:  115-130  Fluid:  UOP + 1000 ml   Lajuan Lines, RD, LDN Clinical Nutrition After Hours/Weekend Pager # in [place]

## 2020-11-27 NOTE — Progress Notes (Signed)
Physical Therapy Treatment Patient Details Name: [name] MRN: [id number] DOB: [birth year] Today's Date: 11/27/2020    History of Present Illness [name] is a 68 y.o. male with medical history significant for type 2 diabetes, CKD stage V recently initiated on hemodialysis MWF, atrial fibrillation/flutter on amiodarone and metoprolol and Eliquis, recurrent orthostatic hypotension, dyslipidemia, and recent discharge on 12/2 for colitis with associated adynamic ileus and acute blood loss anemia who presented back to the ED today after he was noted to have an episode of syncope while getting up to go to hemodialysis from his SNF.  On arrival to the ED his systolic blood pressures were in the 70 mmHg range.  He is noted to have some diarrhea, but he has recently completed course of ciprofloxacin and Flagyl by 11/23.  He was noted to have nonspecific colitis on pathology after recent colonoscopy on 11/17 and was started on prednisone taper which he continues to take at this time.    PT Comments    Patient demonstrates slow labored movement for sitting up at bedside requiring Min/mod assist to help pull self to sitting, tolerated sitting up at bedside for approximately 12-15 minutes while completing BLE exercises, limited to extending knees through 20% of full ROM due to weakness while completing LAQ's, completed 1 sit to stand but unable to take steps due to weakness and had to sit down after 30-40 seconds.  Patient's orthostatic BP's as follows: lying 139/72, sitting 116/58, and unable to complete standing BP due to having to sit after after a few seconds.  Patient put back to bed after therapy.  Patient will benefit from continued physical therapy in hospital and recommended venue below to increase strength, balance, endurance for safe ADLs and gait.    Follow Up Recommendations  SNF     Equipment Recommendations  Rolling walker with 5" wheels    Recommendations for Other Services        Precautions / Restrictions Precautions Precautions: Fall Precaution Comments: monitor BP Restrictions Weight Bearing Restrictions: No    Mobility  Bed Mobility Overal bed mobility: Needs Assistance Bed Mobility: Supine to Sit;Sit to Supine     Supine to sit: Min assist;Mod assist Sit to supine: Min guard   General bed mobility comments: slow labored movement requiring Min/mod assist to pull self to sitting  Transfers Overall transfer level: Needs assistance Equipment used: Rolling walker (2 wheeled) Transfers: Sit to/from Stand Sit to Stand: Mod assist;Max assist         General transfer comment: very unstedy on feet with trunk flexed due to weakness  Ambulation/Gait                 Stairs             Wheelchair Mobility    Modified Rankin (Stroke Patients Only)       Balance Overall balance assessment: Needs assistance Sitting-balance support: Feet supported;No upper extremity supported Sitting balance-Leahy Scale: Fair Sitting balance - Comments: fair/good seated at bedside   Standing balance support: During functional activity;Bilateral upper extremity supported Standing balance-Leahy Scale: Poor Standing balance comment: fair/poor using RW                            Cognition Arousal/Alertness: Awake/alert Behavior During Therapy: WFL for tasks assessed/performed Overall Cognitive Status: Within Functional Limits for tasks assessed  Exercises General Exercises - Lower Extremity Long Arc Quad: Seated;AROM;Strengthening;Both;15 reps Hip Flexion/Marching: Seated;AROM;Strengthening;Both;15 reps Toe Raises: Seated;AROM;Strengthening;Left;15 reps Heel Raises: Seated;AROM;Strengthening;Both;15 reps    General Comments        Pertinent Vitals/Pain Pain Assessment: No/denies pain    Home Living                      Prior Function            PT Goals  (current goals can now be found in the care plan section) Acute Rehab PT Goals Patient Stated Goal: return home after rehab PT Goal Formulation: With patient Time For Goal Achievement: 11/29/20 Potential to Achieve Goals: Good Progress towards PT goals: Progressing toward goals    Frequency    Min 3X/week      PT Plan Current plan remains appropriate    Co-evaluation              AM-PAC PT "6 Clicks" Mobility   Outcome Measure  Help needed turning from your back to your side while in a flat bed without using bedrails?: None Help needed moving from lying on your back to sitting on the side of a flat bed without using bedrails?: A Little Help needed moving to and from a bed to a chair (including a wheelchair)?: A Lot Help needed standing up from a chair using your arms (e.g., wheelchair or bedside chair)?: A Lot Help needed to walk in hospital room?: A Lot Help needed climbing 3-5 steps with a railing? : Total 6 Click Score: 14    End of Session   Activity Tolerance: Patient tolerated treatment well;Patient limited by fatigue Patient left: in bed;with call bell/phone within reach Nurse Communication: Mobility status PT Visit Diagnosis: Unsteadiness on feet (R26.81);Other abnormalities of gait and mobility (R26.89);Muscle weakness (generalized) (M62.81)     Time: 2426-8341 PT Time Calculation (min) (ACUTE ONLY): 21 min  Charges:  $Therapeutic Exercise: 8-22 mins $Therapeutic Activity: 8-22 mins                     4:05 PM, 11/27/20 Lonell Grandchild, MPT Physical Therapist with [facility] 336 [phone] office [phone] mobile phone

## 2020-11-27 NOTE — Procedures (Signed)
   HEMODIALYSIS TREATMENT NOTE:  Uneventful 3 hour HD session completed via [facility] TDC. Kept even / no fluid removed as per order. Darbepoetin 232mcg dose given (was due yesterday, but HD was r/s for today).  All blood was returned.  Rockwell Alexandria, RN

## 2020-11-27 NOTE — Progress Notes (Signed)
Subjective:  Patient remains with diarrhea.  He had 2 small loose stools yesterday and it had to by the time I saw him earlier today.  He says his appetite is good.  He is not having abdominal pain nausea or vomiting.  Hiccups persist.  He states he has had hiccups for 5 to 6 months. He was able to sit at edge of the bed without getting postural symptoms but unable to stand.  Nursing staff helped him  Current Medications:  Current Facility-Administered Medications:  .  0.9 %  sodium chloride infusion, 100 mL, Intravenous, PRN, Donato Heinz, MD .  0.9 %  sodium chloride infusion, 100 mL, Intravenous, PRN, Donato Heinz, MD .  0.9 %  sodium chloride infusion, 250 mL, Intravenous, PRN, Manuella Ghazi, Pratik D, DO .  acetaminophen (TYLENOL) tablet 650 mg, 650 mg, Oral, Q6H PRN, 650 mg at 11/23/20 0848 **OR** acetaminophen (TYLENOL) suppository 650 mg, 650 mg, Rectal, Q6H PRN, Manuella Ghazi, Pratik D, DO .  amiodarone (PACERONE) tablet 200 mg, 200 mg, Oral, Daily, Manuella Ghazi, Pratik D, DO, 200 mg at 11/27/20 0934 .  bismuth subsalicylate (PEPTO BISMOL) 262 MG/15ML suspension 30 mL, 30 mL, Oral, Q6H, Mahala Menghini, PA-C, 30 mL at 11/27/20 1136 .  calcitRIOL (ROCALTROL) capsule 0.5 mcg, 0.5 mcg, Oral, Daily, Donato Heinz, MD, 0.5 mcg at 11/27/20 0933 .  Chlorhexidine Gluconate Cloth 2 % PADS 6 each, 6 each, Topical, Q0600, Heath Lark D, DO, 6 each at 11/27/20 0535 .  Chlorhexidine Gluconate Cloth 2 % PADS 6 each, 6 each, Topical, Q0600, Corliss Parish, MD, 6 each at 11/27/20 0535 .  chlorproMAZINE (THORAZINE) tablet 10 mg, 10 mg, Oral, QID PRN, Johnson, Clanford L, MD .  Darbepoetin Alfa (ARANESP) injection 200 mcg, 200 mcg, Intravenous, Q Mon-HD, Coladonato, Joseph, MD .  famotidine (PEPCID) tablet 20 mg, 20 mg, Oral, Daily, Johnson, Clanford L, MD, 20 mg at 11/27/20 0933 .  feeding supplement (ENSURE ENLIVE / ENSURE PLUS) liquid 237 mL, 237 mL, Oral, BID BM, Shah, Pratik D, DO, 237 mL at 11/24/20  1603 .  finasteride (PROSCAR) tablet 5 mg, 5 mg, Oral, Daily, Ghimire, Henreitta Leber, MD, 5 mg at 11/27/20 0934 .  fludrocortisone (FLORINEF) tablet 0.2 mg, 0.2 mg, Oral, Daily, Corliss Parish, MD, 0.2 mg at 11/27/20 0934 .  heparin injection 1,400 Units, 20 Units/kg, Dialysis, PRN, Donato Heinz, MD, 1,400 Units at 11/17/20 1725 .  heparin injection 3,800 Units, 3,800 Units, Dialysis, PRN, Donato Heinz, MD, 3,800 Units at 11/23/20 1145 .  lidocaine (PF) (XYLOCAINE) 1 % injection 5 mL, 5 mL, Intradermal, PRN, Donato Heinz, MD .  lidocaine-prilocaine (EMLA) cream 1 application, 1 application, Topical, PRN, Donato Heinz, MD .  loperamide (IMODIUM) capsule 2 mg, 2 mg, Oral, TID, Mahala Menghini, PA-C, 2 mg at 11/27/20 0935 .  metoprolol tartrate (LOPRESSOR) tablet 12.5 mg, 12.5 mg, Oral, BID, Manuella Ghazi, Pratik D, DO, 12.5 mg at 11/27/20 0934 .  midodrine (PROAMATINE) tablet 10 mg, 10 mg, Oral, TID with meals, Johnson, Clanford L, MD, 10 mg at 11/27/20 1136 .  multivitamin (RENA-VIT) tablet 1 tablet, 1 tablet, Oral, QHS, Shah, Pratik D, DO, 1 tablet at 11/26/20 2101 .  ondansetron (ZOFRAN) tablet 4 mg, 4 mg, Oral, Q6H PRN **OR** ondansetron (ZOFRAN) injection 4 mg, 4 mg, Intravenous, Q6H PRN, Manuella Ghazi, Pratik D, DO, 4 mg at 11/22/20 1250 .  pentafluoroprop-tetrafluoroeth (GEBAUERS) aerosol 1 application, 1 application, Topical, PRN, Coladonato, Joseph, MD .  pyridostigmine (MESTINON) tablet 60 mg, 60 mg, Oral, Daily,  Donato Heinz, MD, 60 mg at 11/27/20 0934 .  simvastatin (ZOCOR) tablet 20 mg, 20 mg, Oral, QPM, Shah, Pratik D, DO, 20 mg at 11/26/20 1748 .  sodium chloride flush (NS) 0.9 % injection 3 mL, 3 mL, Intravenous, Q12H, Shah, Pratik D, DO, 3 mL at 11/27/20 0935 .  sodium chloride flush (NS) 0.9 % injection 3 mL, 3 mL, Intravenous, PRN, Manuella Ghazi, Pratik D, DO, 3 mL at 11/26/20 1045  Objective: Blood pressure 127/69, pulse 64, temperature 98.4 F (36.9 C), temperature source  Oral, resp. rate 18, height _0  (1.88 m), weight 65.7 kg, SpO2 99 %. Patient is alert and in no acute distress. He remains pale. Abdominal examination reveals soft abdomen with temporary percussion note across upper abdomen.  No organomegaly or masses He had TED stockings  Labs/studies Results:   CBC Latest Ref Rng & Units 11/26/2020 11/23/2020 11/22/2020  WBC 4.0 - 10.5 K/uL 15.4(H) 7.0 5.9  Hemoglobin 13.0 - 17.0 g/dL 7.9(L) 9.5(L) 9.0(L)  Hematocrit 39.0 - 52.0 % 26.0(L) 30.8(L) 29.7(L)  Platelets 150 - 400 K/uL 211 254 233    CMP Latest Ref Rng & Units 11/27/2020 11/26/2020 11/25/2020  Glucose 70 - 99 mg/dL 87 116(H) 125(H)  BUN 8 - 23 mg/dL 75(H) 68(H) 56(H)  Creatinine 0.61 - 1.24 mg/dL 6.52(H) 5.73(H) 4.90(H)  Sodium 135 - 145 mmol/L 129(L) 128(L) 128(L)  Potassium 3.5 - 5.1 mmol/L 3.7 3.8 4.2  Chloride 98 - 111 mmol/L 98 98 99  CO2 22 - 32 mmol/L 18(L) 17(L) 18(L)  Calcium 8.9 - 10.3 mg/dL 8.0(L) 8.0(L) 7.7(L)  Total Protein 6.5 - 8.1 g/dL - - -  Total Bilirubin 0.3 - 1.2 mg/dL - - -  Alkaline Phos 38 - 126 U/L - - -  AST 15 - 41 U/L - - -  ALT 0 - 44 U/L - - -    Hepatic Function Latest Ref Rng & Units 11/27/2020 11/26/2020 11/25/2020  Total Protein 6.5 - 8.1 g/dL - - -  Albumin 3.5 - 5.0 g/dL 2.5(L) 2.9(L) 2.6(L)  AST 15 - 41 U/L - - -  ALT 0 - 44 U/L - - -  Alk Phosphatase 38 - 126 U/L - - -  Total Bilirubin 0.3 - 1.2 mg/dL - - -  Bilirubin, Direct 0.0 - 0.2 mg/dL - - -   Ileal biopsy reveals no abnormality Random biopsy from colon revealed no changes of microscopic or lymphocytic colitis. All 3 polyps are tubular adenomas Biopsy from sigmoid colon also revealed nonspecific etiology.   Assessment:  #1.  Diarrhea.  She remains with diarrhea.  It is not copious or incapacitating.  He appears to be improving with symptomatic therapy.  Extensive work-up has been negative.  Colonoscopy last week revealed single large ulceration involving distal sigmoid colon.   The biopsy has not provided any additional insight.  My impression at this is possibly ischemic.  Fecal elastase has not been done yet.  Will try him on fiber supplement as it might help.  If it worsens his diarrhea will simply stop it.  #2.  Anemia appears to be multifactorial.  Iron studies are consistent with anemia of chronic disease.  No evidence of overt GI bleed.  He did receive a unit of PRBCs on 11/21/2020.  #3.  Chronic kidney disease.  Patient is back on dialysis as of today.  #4.  Orthostatic hypotension.  He is now on midodrine and pyridostigmine.  With this combination is helping as his blood pressure did  not plummet when he sat up at the edge of the bed.  Will ask Dr. Irwin Brakeman if Lopressor could be  #5.  Hiccups.  Hiccups appears to be chronic.  Antibiotic therapy has not helped.   #6.  Malnutrition and muscle wasting.  Hope to see improvement with improvement in oral intake   Recommendations  Fecal elastase and qualitative fecal fat analysis. I have asked nursing staff to let him sit at the edge of the bed and check his pressure while sitting. Metamucil 4 g p.o. nightly.

## 2020-11-27 NOTE — Progress Notes (Signed)
Palliative:   [name] is lying quietly in bed watching TV.  He appears chronically ill and quite frail.   He will briefly make, but not keep eye contact.  He is able to make his needs known.  There is no family at bedside at this time. He tells me that his diarrhea continues to improve.   [name] creatinine has increased today to to 6.5 from 5.73 and he is scheduled for HD later today. He is unable to participate in meaningful discussion related to [place], chronic illness burden and CODE STATUS.    Conference with attending, bedside nursing staff and [facility] team related to patient condition, need, GOC.  PMT to continue to follow.   Plan:  Continue full scope/full code.  PMT to hold code status discussions when [name] is optimized.   25 minutes  Quinn Axe, NP Palliative Medicine Team  Team Phone [phone]

## 2020-11-27 NOTE — Progress Notes (Signed)
PROGRESS NOTE   [name]  AOZ:[id number] DOB: 05/06/1951 DOA: 11/02/2020 PCP: [name], No Pcp Per   Chief Complaint  [name] presents with  . Loss of Consciousness    Brief Admission History:  69 y.o. male ESRD on HD MWF, DM-2, atrial fibrillation, HLD-recent hospitalization for colitis/diarrhea-presented to the hospital for evaluation of syncope due to orthostatic hypotension.  Hospital course complicated by ongoing diarrhea and persistent orthostatic hypotension. Assessment & Plan:   Active Problems:   Syncope due to orthostatic hypotension   ESRD on hemodialysis (HCC)   Colitis  Orthostatic Syncope - Pt continues to have this problem after trials of midodrine, florinef, LE stockings and abdominal binder.  Continue working with PT.   Added IV albumin per Dr. Laural Golden 12/24 to increase oncotic pressures. pyridostygmine started by GI on 12/26.  He may need follow up with an academic/tertiary care facility.   Chronic diarrhea -improving, rectal tube has been removed, diarrhea has been improving.  S/p colonoscopy 12/22.  Awaiting biopsy results from colonoscopy.   Stool frequency is improving and slowing down.  GI team following closely and managing medication.   ESRD on HD -  HD was temporarily on hold per nephrology service but has been restarted due to increasing creatinine.  HD to resume 12/28.      PAF - stable on amiodarone/metoprolol.  Apixaban was held by cardiology due to recent rectal bleeding and acute blood loss anemia.  Anticoagulation to be addressed by outpatient cardiology follow up.   Anemia in CKD - stable, following.   HLD - resume home statin therapy.   Acute urinary retention - s/p foley placement 12/16.  Foley removed 12/23 and he had been voiding.  Unfortunately he developed acute urinary retention again on 12/25 and foley was replaced with >900 mL urine relieved.  Will leave foley in place for now until he can follow up outpatient with urology.   Abnormal  Weight loss - appreciate dietitian consult and recommendations. Following weight closely.    DVT prophylaxis:  Full  Code Status: full  Family Communication: Pt prefers to convey to family healthcare info Disposition:  SNF when medically cleared by GI service and renal service regarding his severe orthostatic hypotension.   Status is: Inpatient  Remains inpatient appropriate because:Inpatient level of care appropriate due to severity of illness. Awaiting clearance from GI for discharge to SNF.     Dispo:  [name] From: [facility]  Planned Disposition: [place]  Expected discharge date: 11/26/2020  Medically stable for discharge: No  Consultants:   GI   Nephrology  cardiology  Procedures:  11/17>> colon biopsy: Severely active chronic nonspecific colitis with ulceration-negative for granulomas or dysplasia.  Stains for CMV negative.  Antimicrobials:  Ciprofloxacin: 11/13>> 11/22 Flagyl: 11/13>> 11/22   Subjective: Pt reports that his bowel movements seem to be slowing down some.     Objective: Vitals:   11/26/20 1951 11/26/20 2008 11/27/20 0500 11/27/20 0544  BP: (!) 147/73  140/70 127/69  Pulse: 71  (!) 10 64  Resp: 18  16 18   Temp: 97.6 F (36.4 C)  98 F (36.7 C) 98.4 F (36.9 C)  TempSrc:   Oral Oral  SpO2: 100% 95% 95% 99%  Weight:   65.7 kg   Height:        Intake/Output Summary (Last 24 hours) at 11/27/2020 0935 Last data filed at 11/27/2020 0500 Gross per 24 hour  Intake 600 ml  Output 1550 ml  Net -950  ml   Filed Weights   11/24/20 0543 11/26/20 0500 11/27/20 0500  Weight: 66.2 kg 66 kg 65.7 kg   Examination:  General exam: appears to be losing weight, chronically ill appearing male, Appears calm and comfortable  Respiratory system: Clear to auscultation. Respiratory effort normal. Cardiovascular system: S1 & S2 heard. No JVD, murmurs, rubs, gallops or clicks. No pedal edema. Gastrointestinal system: Abdomen is  nondistended, soft and nontender. No organomegaly or masses felt. Normal bowel sounds heard. GU: foley in place draining amber urine Central nervous system: Alert and oriented. No focal neurological deficits. Extremities: Symmetric 5 x 5 power. Skin: No rashes, lesions or ulcers Psychiatry: Judgement and insight appear normal. Mood & affect flat.   Data Reviewed: I have personally reviewed following labs and imaging studies  CBC: Recent Labs  Lab 11/21/20 0911 11/21/20 2112 11/22/20 0616 11/23/20 0503 11/26/20 0539  WBC 6.0  --  5.9 7.0 15.4*  HGB 7.9* 8.9* 9.0* 9.5* 7.9*  HCT 25.5* 29.7* 29.7* 30.8* 26.0*  MCV 95.1  --  94.6 95.4 94.5  PLT 213  --  233 254 950    Basic Metabolic Panel: Recent Labs  Lab 11/23/20 0503 11/24/20 0721 11/25/20 0754 11/26/20 0539 11/27/20 0514  NA 130* 129* 128* 128* 129*  K 3.7 3.8 4.2 3.8 3.7  CL 98 100 99 98 98  CO2 20* 18* 18* 17* 18*  GLUCOSE 100* 136* 125* 116* 87  BUN 40* 50* 56* 68* 75*  CREATININE 3.35* 4.12* 4.90* 5.73* 6.52*  CALCIUM 7.6* 7.7* 7.7* 8.0* 8.0*  PHOS 3.2 5.0* 4.5 3.8 4.6    GFR: Estimated Creatinine Clearance: 9.8 mL/min (A) (by C-G formula based on SCr of 6.52 mg/dL (H)).  Liver Function Tests: Recent Labs  Lab 11/23/20 0503 11/24/20 0721 11/25/20 0754 11/26/20 0539 11/27/20 0514  ALBUMIN 1.9* 2.3* 2.6* 2.9* 2.5*    CBG: No results for input(s): GLUCAP in the last 168 hours.  Recent Results (from the past 240 hour(s))  Gastrointestinal Panel by PCR , Stool     Status: None   Collection Time: 11/21/20  3:46 PM   Specimen: Stool  Result Value Ref Range Status   Campylobacter species NOT DETECTED NOT DETECTED Final   Plesimonas shigelloides NOT DETECTED NOT DETECTED Final   Salmonella species NOT DETECTED NOT DETECTED Final   Yersinia enterocolitica NOT DETECTED NOT DETECTED Final   Vibrio species NOT DETECTED NOT DETECTED Final   Vibrio cholerae NOT DETECTED NOT DETECTED Final    Enteroaggregative E coli (EAEC) NOT DETECTED NOT DETECTED Final   Enteropathogenic E coli (EPEC) NOT DETECTED NOT DETECTED Final   Enterotoxigenic E coli (ETEC) NOT DETECTED NOT DETECTED Final   Shiga like toxin producing E coli (STEC) NOT DETECTED NOT DETECTED Final   Shigella/Enteroinvasive E coli (EIEC) NOT DETECTED NOT DETECTED Final   Cryptosporidium NOT DETECTED NOT DETECTED Final   Cyclospora cayetanensis NOT DETECTED NOT DETECTED Final   Entamoeba histolytica NOT DETECTED NOT DETECTED Final   Giardia lamblia NOT DETECTED NOT DETECTED Final   Adenovirus F40/41 NOT DETECTED NOT DETECTED Final   Astrovirus NOT DETECTED NOT DETECTED Final   Norovirus GI/GII NOT DETECTED NOT DETECTED Final   Rotavirus A NOT DETECTED NOT DETECTED Final   Sapovirus (I, II, IV, and V) NOT DETECTED NOT DETECTED Final    Comment: Performed at [facility], [address] [place], [place] [address]  SARS Coronavirus 2 by RT PCR (hospital order, performed in [facility] hospital lab)  Nasopharyngeal Nasopharyngeal Swab     Status: None   Collection Time: 11/22/20 10:35 AM   Specimen: Nasopharyngeal Swab  Result Value Ref Range Status   SARS Coronavirus 2 NEGATIVE NEGATIVE Final    Comment: (NOTE) SARS-CoV-2 target nucleic acids are NOT DETECTED.  The SARS-CoV-2 RNA is generally detectable in upper and lower respiratory specimens during the acute phase of infection. The lowest concentration of SARS-CoV-2 viral copies this assay can detect is 250 copies / mL. A negative result does not preclude SARS-CoV-2 infection and should not be used as the sole basis for treatment or other [name] management decisions.  A negative result may occur with improper specimen collection / handling, submission of specimen other than nasopharyngeal swab, presence of viral mutation(s) within the areas targeted by this assay, and inadequate number of viral copies (<250 copies / mL). A negative result must be combined  with clinical observations, [name] history, and epidemiological information.  Fact Sheet for Patients:   StrictlyIdeas.no  Fact Sheet for Healthcare Providers: BankingDealers.co.za  This test is not yet approved or  cleared by the [place] FDA and has been authorized for detection and/or diagnosis of SARS-CoV-2 by FDA under an Emergency Use Authorization (EUA).  This EUA will remain in effect (meaning this test can be used) for the duration of the COVID-19 declaration under Section 564(b)(1) of the Act, 21 U.S.C. section 360bbb-3(b)(1), unless the authorization is terminated or revoked sooner.  Performed at [facility], [address], [place], [place] [address]      Radiology Studies: No results found.  Scheduled Meds: . amiodarone  200 mg Oral Daily  . bismuth subsalicylate  30 mL Oral Q6H  . calcitRIOL  0.5 mcg Oral Daily  . Chlorhexidine Gluconate Cloth  6 each Topical Q0600  . Chlorhexidine Gluconate Cloth  6 each Topical Q0600  . darbepoetin (ARANESP) injection - DIALYSIS  200 mcg Intravenous Q Mon-HD  . famotidine  20 mg Oral Daily  . feeding supplement  237 mL Oral BID BM  . finasteride  5 mg Oral Daily  . fludrocortisone  0.2 mg Oral Daily  . loperamide  2 mg Oral TID  . metoprolol tartrate  12.5 mg Oral BID  . midodrine  10 mg Oral TID with meals  . multivitamin  1 tablet Oral QHS  . pyridostigmine  60 mg Oral Daily  . simvastatin  20 mg Oral QPM  . sodium chloride flush  3 mL Intravenous Q12H   Continuous Infusions: . sodium chloride    . sodium chloride    . sodium chloride      LOS: 25 days   Time spent: 35 mins   Larrissa Stivers Wynetta Emery, MD How to contact the [facility] Attending or Consulting provider [place] or covering provider during after hours [place], for this [name]?  1. Check the care team in [facility] and look for a) attending/consulting [facility] provider listed and b) the [facility] team listed 2. Log into  [email or web address] and use [place]'s universal password to access. If you do not have the password, please contact the hospital operator. 3. Locate the [facility] provider you are looking for under [facility] and page to a number that you can be directly reached. 4. If you still have difficulty reaching the provider, please page the [facility] (Director on Call) for the Hospitalists listed on amion for assistance.  11/27/2020, 9:35 AM

## 2020-11-28 ENCOUNTER — Encounter (HOSPITAL_COMMUNITY): Payer: Self-pay | Admitting: Internal Medicine

## 2020-11-28 ENCOUNTER — Inpatient Hospital Stay (HOSPITAL_COMMUNITY): Payer: Medicare Other

## 2020-11-28 DIAGNOSIS — Z992 Dependence on renal dialysis: Secondary | ICD-10-CM | POA: Diagnosis not present

## 2020-11-28 DIAGNOSIS — K529 Noninfective gastroenteritis and colitis, unspecified: Secondary | ICD-10-CM | POA: Diagnosis not present

## 2020-11-28 DIAGNOSIS — N186 End stage renal disease: Secondary | ICD-10-CM | POA: Diagnosis not present

## 2020-11-28 DIAGNOSIS — I951 Orthostatic hypotension: Secondary | ICD-10-CM | POA: Diagnosis not present

## 2020-11-28 DIAGNOSIS — R55 Syncope and collapse: Secondary | ICD-10-CM | POA: Diagnosis not present

## 2020-11-28 LAB — CBC WITH DIFFERENTIAL/PLATELET
Abs Immature Granulocytes: 0.21 10*3/uL — ABNORMAL HIGH (ref 0.00–0.07)
Basophils Absolute: 0 10*3/uL (ref 0.0–0.1)
Basophils Relative: 0 %
Eosinophils Absolute: 0 10*3/uL (ref 0.0–0.5)
Eosinophils Relative: 0 %
HCT: 24.6 % — ABNORMAL LOW (ref 39.0–52.0)
Hemoglobin: 7.9 g/dL — ABNORMAL LOW (ref 13.0–17.0)
Immature Granulocytes: 2 %
Lymphocytes Relative: 5 %
Lymphs Abs: 0.8 10*3/uL (ref 0.7–4.0)
MCH: 29.2 pg (ref 26.0–34.0)
MCHC: 32.1 g/dL (ref 30.0–36.0)
MCV: 90.8 fL (ref 80.0–100.0)
Monocytes Absolute: 1.2 10*3/uL — ABNORMAL HIGH (ref 0.1–1.0)
Monocytes Relative: 8 %
Neutro Abs: 11.8 10*3/uL — ABNORMAL HIGH (ref 1.7–7.7)
Neutrophils Relative %: 85 %
Platelets: 145 10*3/uL — ABNORMAL LOW (ref 150–400)
RBC: 2.71 MIL/uL — ABNORMAL LOW (ref 4.22–5.81)
RDW: 19.3 % — ABNORMAL HIGH (ref 11.5–15.5)
WBC: 14 10*3/uL — ABNORMAL HIGH (ref 4.0–10.5)
nRBC: 0 % (ref 0.0–0.2)

## 2020-11-28 LAB — RENAL FUNCTION PANEL
Albumin: 2.3 g/dL — ABNORMAL LOW (ref 3.5–5.0)
Anion gap: 12 (ref 5–15)
BUN: 31 mg/dL — ABNORMAL HIGH (ref 8–23)
CO2: 24 mmol/L (ref 22–32)
Calcium: 7.5 mg/dL — ABNORMAL LOW (ref 8.9–10.3)
Chloride: 94 mmol/L — ABNORMAL LOW (ref 98–111)
Creatinine, Ser: 3.51 mg/dL — ABNORMAL HIGH (ref 0.61–1.24)
GFR, Estimated: 18 mL/min — ABNORMAL LOW (ref >60)
Glucose, Bld: 98 mg/dL (ref 70–99)
Phosphorus: 2.4 mg/dL — ABNORMAL LOW (ref 2.5–4.6)
Potassium: 2.9 mmol/L — ABNORMAL LOW (ref 3.5–5.1)
Sodium: 130 mmol/L — ABNORMAL LOW (ref 135–145)

## 2020-11-28 MED ORDER — POTASSIUM CHLORIDE CRYS ER 20 MEQ PO TBCR
20.0000 meq | EXTENDED_RELEASE_TABLET | Freq: Four times a day (QID) | ORAL | Status: AC
  Administered 2020-11-28 (×2): 20 meq via ORAL
  Filled 2020-11-28 (×2): qty 1

## 2020-11-28 NOTE — Progress Notes (Addendum)
Patient ID: [name], male   DOB: 06-16-1951, 69 y.o.   MRN: [id number] S:   No c/o this AM  Tolerated HD yesterday   Patient says he doesn't remember how things are going  Diarrhea stable, GI following  O:BP 131/61 (BP Location: Right Arm)   Pulse 73   Temp 99.4 F (37.4 C) (Oral)   Resp 16   Ht 6\' 2"  (1.88 m)   Wt 65.7 kg   SpO2 99%   BMI 18.60 kg/m   Intake/Output Summary (Last 24 hours) at 11/28/2020 0906 Last data filed at 11/28/2020 0533 Gross per 24 hour  Intake 240 ml  Output 450 ml  Net -210 ml   Intake/Output: I/O last 3 completed shifts: In: 720 [P.O.:720] Out: 800 [Urine:800]    Intake/Output this shift:  No intake/output data recorded. Weight change: 0 kg Gen: NAD CVS: RRR Resp: bilateral chest rise w/ no iwob Abd: +BS, soft, NT/ND Ext: trace pedal edema  Recent Labs  Lab 11/21/20 0911 11/22/20 0616 11/23/20 0503 11/24/20 0721 11/25/20 0754 11/26/20 0539 11/27/20 0514 11/28/20 0459  NA 131* 132* 130* 129* 128* 128* 129* 130*  K 3.8 3.9 3.7 3.8 4.2 3.8 3.7 2.9*  CL 102 102 98 100 99 98 98 94*  CO2 19* 20* 20* 18* 18* 17* 18* 24  GLUCOSE 92 133* 100* 136* 125* 116* 87 98  BUN 43* 41* 40* 50* 56* 68* 75* 31*  CREATININE 3.13* 3.34* 3.35* 4.12* 4.90* 5.73* 6.52* 3.51*  ALBUMIN 1.8* 1.9* 1.9* 2.3* 2.6* 2.9* 2.5* 2.3*  CALCIUM 7.4* 7.4* 7.6* 7.7* 7.7* 8.0* 8.0* 7.5*  PHOS 3.8  --  3.2 5.0* 4.5 3.8 4.6 2.4*   Liver Function Tests: Recent Labs  Lab 11/26/20 0539 11/27/20 0514 11/28/20 0459  ALBUMIN 2.9* 2.5* 2.3*   No results for input(s): LIPASE, AMYLASE in the last 168 hours. No results for input(s): AMMONIA in the last 168 hours. CBC: Recent Labs  Lab 11/22/20 0616 11/23/20 0503 11/26/20 0539 11/27/20 1400 11/28/20 0459  WBC 5.9 7.0 15.4* 14.3* 14.0*  NEUTROABS  --   --   --   --  11.8*  HGB 9.0* 9.5* 7.9* 7.5* 7.9*  HCT 29.7* 30.8* 26.0* 23.6* 24.6*  MCV 94.6 95.4 94.5 92.9 90.8  PLT 233 254 211 165 145*   Cardiac  Enzymes: No results for input(s): CKTOTAL, CKMB, CKMBINDEX, TROPONINI in the last 168 hours. CBG: No results for input(s): GLUCAP in the last 168 hours.  Iron Studies:  Recent Labs    11/26/20 0539  IRON 9*  TIBC NOT CALCULATED  FERRITIN 1,477*   Studies/Results: No results found. Marland Kitchen amiodarone  200 mg Oral Daily  . bismuth subsalicylate  30 mL Oral Q6H  . calcitRIOL  0.5 mcg Oral Daily  . Chlorhexidine Gluconate Cloth  6 each Topical Q0600  . Chlorhexidine Gluconate Cloth  6 each Topical Q0600  . darbepoetin (ARANESP) injection - DIALYSIS  200 mcg Intravenous Q Mon-HD  . famotidine  20 mg Oral Daily  . feeding supplement  237 mL Oral BID BM  . finasteride  5 mg Oral Daily  . fludrocortisone  0.2 mg Oral Daily  . loperamide  2 mg Oral TID  . metoprolol tartrate  12.5 mg Oral BID  . midodrine  10 mg Oral TID with meals  . multivitamin  1 tablet Oral QHS  . potassium chloride  20 mEq Oral Q6H  . psyllium  1 packet Oral QHS  . pyridostigmine  60 mg Oral Daily  . simvastatin  20 mg Oral QPM  . sodium chloride flush  3 mL Intravenous Q12H    BMET    Component Value Date/Time   NA 130 (L) 11/28/2020 0459   K 2.9 (L) 11/28/2020 0459   CL 94 (L) 11/28/2020 0459   CO2 24 11/28/2020 0459   GLUCOSE 98 11/28/2020 0459   BUN 31 (H) 11/28/2020 0459   CREATININE 3.51 (H) 11/28/2020 0459   CALCIUM 7.5 (L) 11/28/2020 0459   GFRNONAA 18 (L) 11/28/2020 0459   GFRAA 19 (L) 06/15/2018 0407   CBC    Component Value Date/Time   WBC 14.0 (H) 11/28/2020 0459   RBC 2.71 (L) 11/28/2020 0459   HGB 7.9 (L) 11/28/2020 0459   HCT 24.6 (L) 11/28/2020 0459   PLT 145 (L) 11/28/2020 0459   MCV 90.8 11/28/2020 0459   MCH 29.2 11/28/2020 0459   MCHC 32.1 11/28/2020 0459   RDW 19.3 (H) 11/28/2020 0459   LYMPHSABS 0.8 11/28/2020 0459   MONOABS 1.2 (H) 11/28/2020 0459   EOSABS 0.0 11/28/2020 0459   BASOSABS 0.0 11/28/2020 0459   Assessment/Plan:  1. RecurrentSyncope/hypotension; sig  orthostasis/autonomic dysfunction:keeping even on HD, UOP adequate 1. Don't see any orthostatics 2. Cont Midodrine, florinef, pyridostigmine 3. Maintain abd binder and compression stockings 2. Colitis- GI following s/p cipro/flagyl, colonoscopy (w/ blood and colitis), and biopsies. Unclear cause. Biopsies of rectum and sigmoid colon show active nonspecific colitis w/ ulceration c/f medication induced (NSAIDs), ischemia, or stercoral proctitis.  3. Urinary retention- unclear if medication associated or BPH. UOP lower today. Continue foley and likely urology eval outpt.  4. [facility]. 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 1. No amyloid deposits reported. 2. Does not have sufficient GFR to stay off HD after trial; resuming 12/28 5. Anemia:of CKD, possibly some GIB- s/p blood transfusion. No heparin with HD. GI on board, colonoscopyfindings as above. Rec 1u prbc 11/22 1. Transfuse as needed to keep Hgb >7 2. On Aranesp weekly- inc dose- iron stores low on 12/10 and repleted. IV Fe 6. CKD-MBD: calcitriol 0.14mcg daily. P ok w/o binder 7. Severe protein malnutrition:per primary, push protein 8. Vascular access- will need to f/u with Dr. Donnetta Hutching once stable for discharge. 9. A fib- on amiodarone and eliquis (off A/C as of right now) 10. Hyponatremia. Mild, stable and asymptomatic.   11. Hypokalemia: 2.9 today. 40 meq today. 12. Disposition- pending ability to safely ambulate before discharge and GI clearance  [facility]

## 2020-11-28 NOTE — Progress Notes (Signed)
Physical Therapy Treatment Patient Details Name: [name] MRN: [id number] DOB: [birth year] Today's Date: 11/28/2020    History of Present Illness [name] is a 69 y.o. male with medical history significant for type 2 diabetes, CKD stage V recently initiated on hemodialysis MWF, atrial fibrillation/flutter on amiodarone and metoprolol and Eliquis, recurrent orthostatic hypotension, dyslipidemia, and recent discharge on 12/2 for colitis with associated adynamic ileus and acute blood loss anemia who presented back to the ED today after he was noted to have an episode of syncope while getting up to go to hemodialysis from his SNF.  On arrival to the ED his systolic blood pressures were in the 70 mmHg range.  He is noted to have some diarrhea, but he has recently completed course of ciprofloxacin and Flagyl by 11/23.  He was noted to have nonspecific colitis on pathology after recent colonoscopy on 11/17 and was started on prednisone taper which he continues to take at this time.    PT Comments    Pt supine at entrance, therapist entered with RN for morning medication.  Pt independent with rolling in bed, does require min A to assist with sitting on EOB.  Pt demonstrated slow labored movements with bed mobility.  Upon sitting decreased sitting balance with tendency to lean to Rt or posterior lean required assistance to reduce leaning backwards.  BP supine: 123/60 mmHg and HR 62, upon sitting decreased BP to 88/51 mmHg and pulse at 77 BPM.  No standing complete this session due to decreased BP.  Seated exercises complete for LE strengthening and sitting balance.  EOS pt left in bed with call bell within reach.  Pt instructed mechanics to assist with sliding towards HOB, able to complete independenlty.  Noted bowel movement complete during sliding toward HOB, NT informed for clean up.  No reports of pain through session, minimal c/o dizziness or lightheadedness.     Follow Up Recommendations  SNF      Equipment Recommendations  Rolling walker with 5" wheels    Recommendations for Other Services       Precautions / Restrictions Precautions Precautions: Fall Precaution Comments: monitor BP    Mobility  Bed Mobility Overal bed mobility: Modified Independent Bed Mobility: Supine to Sit;Sit to Supine Rolling: Independent   Supine to sit: Min assist Sit to supine: Min guard   General bed mobility comments: slow labored movement requiring Min/mod assist to pull self to sitting  Transfers                    Ambulation/Gait                 Stairs             Wheelchair Mobility    Modified Rankin (Stroke Patients Only)       Balance                                            Cognition Arousal/Alertness: Awake/alert Behavior During Therapy: WFL for tasks assessed/performed Overall Cognitive Status: Within Functional Limits for tasks assessed                                        Exercises General Exercises - Lower Extremity Ankle Circles/Pumps: Seated;20 reps Long Arc  Quad: Seated;AROM;Strengthening;Both;15 reps;AAROM    General Comments        Pertinent Vitals/Pain Pain Assessment: No/denies pain    Home Living                      Prior Function            PT Goals (current goals can now be found in the care plan section)      Frequency    Min 3X/week      PT Plan Current plan remains appropriate    Co-evaluation              AM-PAC PT "6 Clicks" Mobility   Outcome Measure  Help needed turning from your back to your side while in a flat bed without using bedrails?: None Help needed moving from lying on your back to sitting on the side of a flat bed without using bedrails?: A Little Help needed moving to and from a bed to a chair (including a wheelchair)?: A Lot Help needed standing up from a chair using your arms (e.g., wheelchair or bedside chair)?: A Lot Help  needed to walk in hospital room?: A Lot Help needed climbing 3-5 steps with a railing? : Total 6 Click Score: 14    End of Session   Activity Tolerance: Patient tolerated treatment well;Patient limited by fatigue Patient left: in bed;with call bell/phone within reach Nurse Communication: Mobility status PT Visit Diagnosis: Unsteadiness on feet (R26.81);Other abnormalities of gait and mobility (R26.89);Muscle weakness (generalized) (M62.81)     Time: 8889-1694 PT Time Calculation (min) (ACUTE ONLY): 20 min  Charges:  $Therapeutic Activity: 8-22 mins                     Ihor Austin, LPTA/CLT; CBIS [phone]  Aldona Lento 11/28/2020, 9:31 AM

## 2020-11-28 NOTE — Progress Notes (Signed)
PROGRESS NOTE  [name] CHY:[id number] DOB: 07/10/1951 DOA: 11/02/2020 PCP: Patient, No Pcp Per  Brief History:  69yo male with history of diabetes mellitus, CKD stage V started recently on dialysis Monday Wednesday Friday and still making urine, recent A. fib and was restarted on amiodarone metoprolol and Eliquis was sent from SNF after he was noted to have an episode of syncope while getting up to go to hemodialysis.   On arrival to the ED his systolic blood pressures were in the 70 mmHg range.  He is noted to have some diarrhea, but he has recently completed course of ciprofloxacin and Flagyl by 11/23.  He was noted to have nonspecific colitis on pathology after recent colonoscopy on 11/17 and was started on prednisone taper which he continues to take at this time.  He was just discharged after admission from 10/12/20 to 11/01/20 when he was treated for syncope from orthostasis as well as colitis as discussed above.  He also required 5 units PRBC last admission.  Hewas started on Solu-Medrol 60 mg daily 11/19--per GI>>po 40 mg daily--wean by 10 mg per week--started 30 mg daily on 11/02/20.  He is now finished with steroids   ED Course: Patient had received 250 mL of normal saline fluid bolus with improvement in blood pressure readings.  Manual blood pressures confirm systolics in the low 878 range and heart rates fluctuate in the low 100 range.  He denies any palpitations or chest pains or shortness of breath.  Laboratory data with stable hemoglobin levels noted and he is noted to be hypokalemic with potassium 2.9 and he has a lactic acid level 2.6.  Covid testing ordered and pending.  Assessment/Plan: Orthostatic hypotension -continues to be orthostatic despite pyridostigmine, midodrine and florinef -this lead to repeat admission after d/c on 11/01/20 -10/13/20--EchoEF 67-67%MCNOB 1 diastolic dysfunction -add abdominal binder and knee stockings  Chronic Diarrhea -rectal tube  removed -improving with imodium -11/21/20 GI pathogen panel neg -GI following -11/21/20 colonoscopy--polyps in asc colon & 2 polyps hepatic flexure, extensive circumferential ulcers in distal sigmoid -GI suspects ischemic injury -Fecal elastase and qualitative fecal fat analysis ordered by GI  Anemia of CKD -had rectal bleed last admit requiring 5 units PRBC -11/21/20--received 1 unit this admit -multifactorial including CKD, blood draws, chronic disease -iron studies consistent with ACD  ESRD --started recently on dialysis MWF --HD on 12/28--unable to remove fluid --having intradialytic hypotension already on midodrine-->having difficulty removing fluid  Recent paroxysmal A. fib --started on amiodarone last admission --Eliquis--holdingdue to his rectal bleeding and acute blood loss anemia --Patient understands risks and benefits of holding anticoagulation and he seems to be reluctant to start it again-we will need close follow-up with his cardiologist/PCP. --metoprolol stopped due to orthostasis initially --start metoprolol 12.5 mg bid as pt having more afib RVR episodes from last admission  Hyponatremia, --sodiumis low continue to adjust dialysis.   Hyperlipidemia: --Continue statins.  Acute urinary retention - s/p foley placement 12/16.  Foley removed 12/23 and he had been voiding.  Unfortunately he developed acute urinary retention again on 12/25 and foley was replaced with >900 mL urine relieved.  Will leave foley in place for now until he can follow up outpatient with urology.    Status is: Inpatient  Remains inpatient appropriate because:IV treatments appropriate due to intensity of illness or inability to take PO  Remains orthostatic putting him at high risk for re-admission Also unable to remove fluid on  HD   Dispo:  Patient From: [facility]  Planned Disposition: [place]  Expected discharge date: 12/04/2020  Medically  stable for discharge: No         Family Communication:   Family at bedside  Consultants:    Code Status:  FULL / DNR  DVT Prophylaxis:  [place] Heparin / [place] Lovenox   Procedures: As Listed in Progress Note Above  Antibiotics: None  RN Pressure Injury Documentation: Pressure Injury 10/13/20 Sacrum Circumferential Stage 2 -  Partial thickness loss of dermis presenting as a shallow open injury with a red, pink wound bed without slough. (Active)  10/13/20 1330  Location: Sacrum  Location Orientation: Circumferential  Staging: Stage 2 -  Partial thickness loss of dermis presenting as a shallow open injury with a red, pink wound bed without slough.  Wound Description (Comments):   Present on Admission: Yes        Subjective: Patient denies fevers, chills, headache, chest pain, dyspnea, nausea, vomiting, diarrhea, abdominal pain, dysuria, hematuria, hematochezia, and melena.   Objective: Vitals:   11/28/20 0020 11/28/20 0511 11/28/20 0927 11/28/20 1438  BP: 120/71 131/61 123/60 (!) 110/58  Pulse: 70 73 62 70  Resp: 16 16    Temp: 98.2 F (36.8 C) 99.4 F (37.4 C)  98.3 F (36.8 C)  TempSrc: Oral Oral  Oral  SpO2:  99%  98%  Weight:      Height:        Intake/Output Summary (Last 24 hours) at 11/28/2020 1616 Last data filed at 11/28/2020 1500 Gross per 24 hour  Intake 480 ml  Output 450 ml  Net 30 ml   Weight change: 0 kg Exam:   General:  Pt is alert, follows commands appropriately, not in acute distress  HEENT: No icterus, No thrush, No neck mass, [place]/AT  Cardiovascular: RRR, S1/S2, no rubs, no gallops  Respiratory: CTA bilaterally, no wheezing, no crackles, no rhonchi  Abdomen: Soft/+BS, non tender, non distended, no guarding  Extremities: No edema, No lymphangitis, No petechiae, No rashes, no synovitis   Data Reviewed: I have personally reviewed following labs and imaging studies Basic Metabolic Panel: Recent Labs  Lab 11/24/20 0721  11/25/20 0754 11/26/20 0539 11/27/20 0514 11/28/20 0459  NA 129* 128* 128* 129* 130*  K 3.8 4.2 3.8 3.7 2.9*  CL 100 99 98 98 94*  CO2 18* 18* 17* 18* 24  GLUCOSE 136* 125* 116* 87 98  BUN 50* 56* 68* 75* 31*  CREATININE 4.12* 4.90* 5.73* 6.52* 3.51*  CALCIUM 7.7* 7.7* 8.0* 8.0* 7.5*  PHOS 5.0* 4.5 3.8 4.6 2.4*   Liver Function Tests: Recent Labs  Lab 11/24/20 0721 11/25/20 0754 11/26/20 0539 11/27/20 0514 11/28/20 0459  ALBUMIN 2.3* 2.6* 2.9* 2.5* 2.3*   No results for input(s): LIPASE, AMYLASE in the last 168 hours. No results for input(s): AMMONIA in the last 168 hours. Coagulation Profile: No results for input(s): INR, PROTIME in the last 168 hours. CBC: Recent Labs  Lab 11/22/20 0616 11/23/20 0503 11/26/20 0539 11/27/20 1400 11/28/20 0459  WBC 5.9 7.0 15.4* 14.3* 14.0*  NEUTROABS  --   --   --   --  11.8*  HGB 9.0* 9.5* 7.9* 7.5* 7.9*  HCT 29.7* 30.8* 26.0* 23.6* 24.6*  MCV 94.6 95.4 94.5 92.9 90.8  PLT 233 254 211 165 145*   Cardiac Enzymes: No results for input(s): CKTOTAL, CKMB, CKMBINDEX, TROPONINI in the last 168 hours. BNP: Invalid input(s): POCBNP CBG: No results  for input(s): GLUCAP in the last 168 hours. HbA1C: No results for input(s): HGBA1C in the last 72 hours. Urine analysis:    Component Value Date/Time   COLORURINE YELLOW 10/12/2020 1622   APPEARANCEUR CLEAR 10/12/2020 1622   LABSPEC 1.008 10/12/2020 1622   PHURINE 8.0 10/12/2020 1622   GLUCOSEU >=500 (A) 10/12/2020 1622   HGBUR NEGATIVE 10/12/2020 1622   BILIRUBINUR NEGATIVE 10/12/2020 1622   KETONESUR NEGATIVE 10/12/2020 1622   PROTEINUR 100 (A) 10/12/2020 1622   NITRITE NEGATIVE 10/12/2020 1622   LEUKOCYTESUR NEGATIVE 10/12/2020 1622   Sepsis Labs: @LABRCNTIP (procalcitonin:4,lacticidven:4) ) Recent Results (from the past 240 hour(s))  Gastrointestinal Panel by PCR , Stool     Status: None   Collection Time: 11/21/20  3:46 PM   Specimen: Stool  Result Value Ref Range  Status   Campylobacter species NOT DETECTED NOT DETECTED Final   Plesimonas shigelloides NOT DETECTED NOT DETECTED Final   Salmonella species NOT DETECTED NOT DETECTED Final   Yersinia enterocolitica NOT DETECTED NOT DETECTED Final   Vibrio species NOT DETECTED NOT DETECTED Final   Vibrio cholerae NOT DETECTED NOT DETECTED Final   Enteroaggregative E coli (EAEC) NOT DETECTED NOT DETECTED Final   Enteropathogenic E coli (EPEC) NOT DETECTED NOT DETECTED Final   Enterotoxigenic E coli (ETEC) NOT DETECTED NOT DETECTED Final   Shiga like toxin producing E coli (STEC) NOT DETECTED NOT DETECTED Final   Shigella/Enteroinvasive E coli (EIEC) NOT DETECTED NOT DETECTED Final   Cryptosporidium NOT DETECTED NOT DETECTED Final   Cyclospora cayetanensis NOT DETECTED NOT DETECTED Final   Entamoeba histolytica NOT DETECTED NOT DETECTED Final   Giardia lamblia NOT DETECTED NOT DETECTED Final   Adenovirus F40/41 NOT DETECTED NOT DETECTED Final   Astrovirus NOT DETECTED NOT DETECTED Final   Norovirus GI/GII NOT DETECTED NOT DETECTED Final   Rotavirus A NOT DETECTED NOT DETECTED Final   Sapovirus (I, II, IV, and V) NOT DETECTED NOT DETECTED Final    Comment: Performed at [facility], [address] [place], [place] [address]  SARS Coronavirus 2 by RT PCR (hospital order, performed in [place] hospital lab) Nasopharyngeal Nasopharyngeal Swab     Status: None   Collection Time: 11/22/20 10:35 AM   Specimen: Nasopharyngeal Swab  Result Value Ref Range Status   SARS Coronavirus 2 NEGATIVE NEGATIVE Final    Comment: (NOTE) SARS-CoV-2 target nucleic acids are NOT DETECTED.  The SARS-CoV-2 RNA is generally detectable in upper and lower respiratory specimens during the acute phase of infection. The lowest concentration of SARS-CoV-2 viral copies this assay can detect is 250 copies / mL. A negative result does not preclude SARS-CoV-2 infection and should not be used as the sole basis for treatment  or other patient management decisions.  A negative result may occur with improper specimen collection / handling, submission of specimen other than nasopharyngeal swab, presence of viral mutation(s) within the areas targeted by this assay, and inadequate number of viral copies (<250 copies / mL). A negative result must be combined with clinical observations, patient history, and epidemiological information.  Fact Sheet for Patients:   StrictlyIdeas.no  Fact Sheet for Healthcare Providers: BankingDealers.co.za  This test is not yet approved or  cleared by the [place] FDA and has been authorized for detection and/or diagnosis of SARS-CoV-2 by FDA under an Emergency Use Authorization (EUA).  This EUA will remain in effect (meaning this test can be used) for the duration of the COVID-19 declaration under Section 564(b)(1) of the  Act, 21 U.S.C. section 360bbb-3(b)(1), unless the authorization is terminated or revoked sooner.  Performed at [facility], [address] [place], [place] [address]      Scheduled Meds: . amiodarone  200 mg Oral Daily  . bismuth subsalicylate  30 mL Oral Q6H  . calcitRIOL  0.5 mcg Oral Daily  . Chlorhexidine Gluconate Cloth  6 each Topical Q0600  . Chlorhexidine Gluconate Cloth  6 each Topical Q0600  . darbepoetin (ARANESP) injection - DIALYSIS  200 mcg Intravenous Q Mon-HD  . famotidine  20 mg Oral Daily  . feeding supplement  237 mL Oral BID BM  . finasteride  5 mg Oral Daily  . fludrocortisone  0.2 mg Oral Daily  . loperamide  2 mg Oral TID  . metoprolol tartrate  12.5 mg Oral BID  . midodrine  10 mg Oral TID with meals  . multivitamin  1 tablet Oral QHS  . psyllium  1 packet Oral QHS  . pyridostigmine  60 mg Oral Daily  . simvastatin  20 mg Oral QPM  . sodium chloride flush  3 mL Intravenous Q12H   Continuous Infusions: . sodium chloride    . sodium chloride    . sodium chloride       Procedures/Studies: MR BRAIN WO CONTRAST  Result Date: 11/16/2020 CLINICAL DATA:  Question deposition disease. Hypotension. Altered mental status. EXAM: MRI HEAD WITHOUT CONTRAST TECHNIQUE: Multiplanar, multiecho pulse sequences of the brain and surrounding structures were obtained without intravenous contrast. COMPARISON:  Head CT 01/25/2020 FINDINGS: Brain: Diffusion imaging does not show any acute or subacute infarction. No focal abnormality affects brainstem or cerebellum. Cerebral hemispheres show mild age related volume loss without evidence of small-vessel disease or large vessel infarction. No mass lesion, hemorrhage, hydrocephalus or extra-axial collection. Vascular: Major vessels at the base of the brain show flow. Skull and upper cervical spine: Negative Sinuses/Orbits: Clear/normal Other: None IMPRESSION: No acute or reversible finding. Mild age related volume loss. No evidence of small-vessel disease or large vessel infarction. Electronically Signed   By: Nelson Chimes M.D.   On: 11/16/2020 14:01   DG CHEST PORT 1 VIEW  Result Date: 10/29/2020 CLINICAL DATA:  Hypoxia, orthostatic hypotension, atrial fibrillation EXAM: PORTABLE CHEST 1 VIEW COMPARISON:  10/12/2020 FINDINGS: Single frontal view of the chest demonstrates right internal jugular dialysis catheter unchanged. Cardiac silhouette is stable. Interval development of bibasilar veiling opacities compatible with consolidation and/or effusion. There is central vascular congestion. No pneumothorax. No acute bony abnormalities. IMPRESSION: 1. Findings compatible with fluid overload and bilateral pleural effusions. Electronically Signed   By: Randa Ngo M.D.   On: 10/29/2020 20:00   DG Abd Portable 1V  Result Date: 11/15/2020 CLINICAL DATA:  Abdominal distension EXAM: PORTABLE ABDOMEN - 1 VIEW COMPARISON:  10/25/2020 FINDINGS: The subdiaphragmatic region and right flank are excluded from view. Multiple gas-filled prominent loops of  large and small bowel are seen throughout the visualized abdomen in keeping with un underlying ileus. No gross free intraperitoneal gas. Gas and stool is seen within the rectal vault. No organomegaly. No acute bone abnormality. IMPRESSION: Mild ileus. Electronically Signed   By: Fidela Salisbury MD   On: 11/15/2020 04:15   CT Angio Abd/Pel w/ and/or w/o  Result Date: 11/07/2020 CLINICAL DATA:  Evaluate for mesenteric ischemia. End-stage renal disease on dialysis. Bowel wall thickening on exam from 10/12/2020. EXAM: CTA ABDOMEN AND PELVIS WITHOUT AND WITH CONTRAST TECHNIQUE: Multidetector CT imaging of the abdomen and pelvis was performed using the  standard protocol during bolus administration of intravenous contrast. Multiplanar reconstructed images and MIPs were obtained and reviewed to evaluate the vascular anatomy. CONTRAST:  75mL OMNIPAQUE IOHEXOL 350 MG/ML SOLN COMPARISON:  10/22/2020 FINDINGS: VASCULAR Aorta: Mild atherosclerotic disease in the abdominal aorta without aneurysm or dissection. Celiac: Celiac trunk is patent without significant stenosis. Focal dilatation of the trunk measures up to 9 mm. Main branch vessels are patent. SMA: Replaced right hepatic artery. SMA is widely patent without significant stenosis. No evidence for aneurysm or dissection. Renals: Main right renal artery is widely patent without stenosis, aneurysm or dissection. There is small accessory right renal artery. High-grade focal narrowing in the proximal main left renal artery without significant plaque in this area. Findings could be related to a dissection but indeterminate. Small accessory left renal artery is patent. IMA: Patent Inflow: Atherosclerotic disease involving the proximal left common iliac artery without significant stenosis. Common iliac arteries are patent bilaterally. Disease and stenosis involving the right internal iliac artery. Left internal iliac artery is patent. Bilateral external iliac arteries are widely  patent. Proximal Outflow: Proximal femoral arteries are patent bilaterally. Veins: Main portal venous system is patent. Limited evaluation of the superior cavoatrial junction on the arterial phase imaging due to non-opacified blood in this area. IVC and renal veins are patent. Narrowing of the left common iliac vein from the right common iliac artery is a normal anatomic variant. Review of the MIP images confirms the above findings. NON-VASCULAR Lower chest: Small bilateral pleural effusions with compressive atelectasis. Hepatobiliary: High-density material in the gallbladder are suggestive for small stones. The gallbladder is moderately distended without definite inflammatory changes. There is colon anterior to the liver. No discrete liver lesion. No biliary dilatation. Gallbladder distension has minimally changed since the previous examination. Pancreas: Unremarkable. No pancreatic ductal dilatation or surrounding inflammatory changes. Spleen: Normal in size without focal abnormality. Adrenals/Urinary Tract: Normal appearance of the adrenal glands. Both kidneys are small with scattered calcifications. Findings are suggestive for nonobstructive renal calculi. Stomach/Bowel: Normal appearance of the stomach and duodenum. There is a rectal catheter with an inflated balloon. Sigmoid colon is moderately distended with mild wall thickening. However, the sigmoid wall thickening is less impressive on the venous phase imaging. Cecum is distended and contains a large amount of stool. Transverse colon is distended with gas. Lymphatic: No significant lymph node enlargement in the abdomen or pelvis. Reproductive: Stable appearance of the prostate. Other: Diffuse subcutaneous edema. Small amount of pelvic ascites which is new. Trace ascites in the left lower quadrant of the abdomen. Trace ascites in the upper abdomen. Negative for free air. Musculoskeletal: Chronic disc space narrowing with endplate changes at R4-Y7. Stable  disc space narrowing at L5-S1. IMPRESSION: VASCULAR 1. Atherosclerotic disease in the abdominal aorta without aneurysm or significant stenosis. Aortic Atherosclerosis (ICD10-I70.0). 2. Main mesenteric arteries are patent without significant stenosis. No evidence to suggest chronic mesenteric ischemia. 3. Multiple renal arteries as described. Focal narrowing the proximal left main renal artery that could be related to atherosclerotic disease or focal dissection in this area. NON-VASCULAR 1. The sigmoid colon has been partially decompressed since 10/22/2020 and placement of the rectal tube. There continues to be gaseous distension of the sigmoid colon and transverse colon. Mild wall thickening in the sigmoid colon is nonspecific but may be related to the partial decompression rather than infectious or inflammatory process. Findings are suggestive for an underlying colonic ileus. 2. Bilateral kidneys are atrophic with bilateral calcifications. Findings are compatible with history of end-stage  renal disease. Calcifications could represent nonobstructive renal calculi. 3. Diffuse subcutaneous edema with small amount of ascites, most prominent in the pelvis. 4. Significant disc space disease at L2-L3. 5. Bilateral pleural effusions with compressive atelectasis at the lung bases. 6. Cholelithiasis with moderate gallbladder distension. Electronically Signed   By: Markus Daft M.D.   On: 11/07/2020 17:58    Orson Eva, DO  [facility]  If 7PM-7AM, please contact night-coverage [email or web address] Password [email or web address] 11/28/2020, 4:16 PM   LOS: 26 days

## 2020-11-28 NOTE — Plan of Care (Signed)

## 2020-11-28 NOTE — Progress Notes (Signed)
Subjective: Patient states 2 watery stools per day. Not worsened. I/Os documented last BM yesterday morning Bristol stool scale # 6 (mushy). Still with orthostatic hypotension. No abdominal pain. Appetite good. No N/V.   Objective: Vital signs in last 24 hours: Temp:  [98.2 F (36.8 C)-99.4 F (37.4 C)] 99.4 F (37.4 C) (12/29 0511) Pulse Rate:  [60-73] 73 (12/29 0511) Resp:  [16-18] 16 (12/29 0511) BP: (103-140)/(60-76) 131/61 (12/29 0511) SpO2:  [99 %] 99 % (12/29 0511) Weight:  [65.7 kg] 65.7 kg (12/28 2100) Last BM Date: 11/27/20 General:   Alert and oriented, chronically ill-appearing and frail. Pale Head:  Normocephalic and atraumatic. Abdomen:  Bowel sounds present, soft, non-tender, non-distended. Abdominal binder in place Neurologic:  Alert and  oriented x4  Intake/Output from previous day: 12/28 0701 - 12/29 0700 In: 480 [P.O.:480] Out: 450 [Urine:450] Intake/Output this shift: No intake/output data recorded.  Lab Results: Recent Labs    11/26/20 0539 11/27/20 1400 11/28/20 0459  WBC 15.4* 14.3* 14.0*  HGB 7.9* 7.5* 7.9*  HCT 26.0* 23.6* 24.6*  PLT 211 165 145*   BMET Recent Labs    11/26/20 0539 11/27/20 0514 11/28/20 0459  NA 128* 129* 130*  K 3.8 3.7 2.9*  CL 98 98 94*  CO2 17* 18* 24  GLUCOSE 116* 87 98  BUN 68* 75* 31*  CREATININE 5.73* 6.52* 3.51*  CALCIUM 8.0* 8.0* 7.5*   LFT Recent Labs    11/26/20 0539 11/27/20 0514 11/28/20 0459  ALBUMIN 2.9* 2.5* 2.3*    Assessment: 69 year old male with history of diarrhea and rectal bleeding (bleeding has resolved), extensive work-up and prolonged hospitalizations.  Incomplete colonoscopy during prior admission, poor prep, blood noted in the rectum, sigmoid, descending colon, splenic flexure. Findings suspicious for segmental or ischemic colitis. Pathology without changes or features of IBD, query drug-induced or ischemic. CMV stains negative.  Stool studies negative multiple times.  He has  not responded to empiric therapy with antibiotics, steroids, pancreatic enzymes, Colestid, octreotide.  Colonoscopy December 22 showing normal terminal ileum, multilobulated 15 millimeters polyp removed piecemeal from sigmoid colon, 2 polyps from the hepatic flexure removed, random colon biopsies negative for microscopic colitis.  Extensive sigmoid colon ulceration. Pathology with tubular adenomas. Path returned but without significant insight and still feel as if dealing with ischemic etiology.   Fiber supplementation started yesterday. He has not had worsening of diarrhea. Last documented stool Bristol scale # 6 yesterday morning. Patient reports to me 2-3 per day. Regimen now includes pepto QID, imodium 2 mg TID, and Metamucil one packet daily. Fecal and pancreatic elastase remains in process.   Anemia: appears to be multifactorial in setting of chronic disease but also concern for occult GI blood loss. Iron low at 9. Ferritin elevated. Received 1 unit PRBCs this admission on 12/22. Hgb 7.9 today. No overt GI bleeding. Most recent admission 11/14-11/26, he received 5 units of PRBCs. From what I can tell, he has received a total of 6 units PRBCs since Nov 2021. Query if EGD would be helpful to exclude any occult lesion in light of transfusion-dependent state.   Orthostatic hypotension: management per attending.    Plan: Awaiting fecal fat and pancreatic elastase, which is in process Continue Pepto QID, Imodium TID, Metamucil packet daily Continue strict I/Os Will discuss need for EGD while inpatient with attending May benefit from tertiary evaluation    Annitta Needs, PhD, ANP-BC [facility]    LOS: 26 days    11/28/2020, 8:41  AM    

## 2020-11-28 NOTE — Progress Notes (Signed)
Portable abdominal film reveals decrease in amount of small bowel gas compared to study from 13 days ago. He has not had any more episodes of emesis according to nursing staff and he ate 50% of his supper.

## 2020-11-29 DIAGNOSIS — R140 Abdominal distension (gaseous): Secondary | ICD-10-CM

## 2020-11-29 DIAGNOSIS — K529 Noninfective gastroenteritis and colitis, unspecified: Secondary | ICD-10-CM | POA: Diagnosis not present

## 2020-11-29 DIAGNOSIS — I951 Orthostatic hypotension: Secondary | ICD-10-CM | POA: Diagnosis not present

## 2020-11-29 DIAGNOSIS — Z7189 Other specified counseling: Secondary | ICD-10-CM

## 2020-11-29 DIAGNOSIS — Z992 Dependence on renal dialysis: Secondary | ICD-10-CM | POA: Diagnosis not present

## 2020-11-29 DIAGNOSIS — N186 End stage renal disease: Secondary | ICD-10-CM | POA: Diagnosis not present

## 2020-11-29 LAB — RENAL FUNCTION PANEL
Albumin: 2.2 g/dL — ABNORMAL LOW (ref 3.5–5.0)
Anion gap: 11 (ref 5–15)
BUN: 44 mg/dL — ABNORMAL HIGH (ref 8–23)
CO2: 25 mmol/L (ref 22–32)
Calcium: 7.9 mg/dL — ABNORMAL LOW (ref 8.9–10.3)
Chloride: 95 mmol/L — ABNORMAL LOW (ref 98–111)
Creatinine, Ser: 5.04 mg/dL — ABNORMAL HIGH (ref 0.61–1.24)
GFR, Estimated: 12 mL/min — ABNORMAL LOW (ref >60)
Glucose, Bld: 136 mg/dL — ABNORMAL HIGH (ref 70–99)
Phosphorus: 3.3 mg/dL (ref 2.5–4.6)
Potassium: 3.5 mmol/L (ref 3.5–5.1)
Sodium: 131 mmol/L — ABNORMAL LOW (ref 135–145)

## 2020-11-29 MED ORDER — METOPROLOL TARTRATE 25 MG PO TABS
12.5000 mg | ORAL_TABLET | Freq: Every day | ORAL | Status: DC
  Administered 2020-11-29 – 2020-12-10 (×12): 12.5 mg via ORAL
  Filled 2020-11-29 (×12): qty 1

## 2020-11-29 NOTE — Progress Notes (Signed)
Patient ID: [name], male   DOB: December 30, 1950, 69 y.o.   MRN: [id number] S:   No c/o this AM  Continues to be dizzy when sitting up  Reportedly diarrhea improved  Dialysis today  O:BP 121/64 (BP Location: Right Arm)   Pulse 64   Temp 98.3 F (36.8 C)   Resp 18   Ht 6\' 2"  (1.88 m)   Wt 67.7 kg   SpO2 99%   BMI 19.16 kg/m   Intake/Output Summary (Last 24 hours) at 11/29/2020 0855 Last data filed at 11/29/2020 0445 Gross per 24 hour  Intake 840 ml  Output 400 ml  Net 440 ml   Intake/Output: I/O last 3 completed shifts: In: 840 [P.O.:840] Out: 850 [Urine:850]    Intake/Output this shift:  No intake/output data recorded. Weight change: 2 kg Gen: NAD CVS: RRR Resp: bilateral chest rise w/ no iwob Abd: +BS, soft, NT/ND Ext: trace pedal edema  Recent Labs  Lab 11/23/20 0503 11/24/20 0721 11/25/20 0754 11/26/20 0539 11/27/20 0514 11/28/20 0459 11/29/20 0526  NA 130* 129* 128* 128* 129* 130* 131*  K 3.7 3.8 4.2 3.8 3.7 2.9* 3.5  CL 98 100 99 98 98 94* 95*  CO2 20* 18* 18* 17* 18* 24 25  GLUCOSE 100* 136* 125* 116* 87 98 136*  BUN 40* 50* 56* 68* 75* 31* 44*  CREATININE 3.35* 4.12* 4.90* 5.73* 6.52* 3.51* 5.04*  ALBUMIN 1.9* 2.3* 2.6* 2.9* 2.5* 2.3* 2.2*  CALCIUM 7.6* 7.7* 7.7* 8.0* 8.0* 7.5* 7.9*  PHOS 3.2 5.0* 4.5 3.8 4.6 2.4* 3.3   Liver Function Tests: Recent Labs  Lab 11/27/20 0514 11/28/20 0459 11/29/20 0526  ALBUMIN 2.5* 2.3* 2.2*   No results for input(s): LIPASE, AMYLASE in the last 168 hours. No results for input(s): AMMONIA in the last 168 hours. CBC: Recent Labs  Lab 11/23/20 0503 11/26/20 0539 11/27/20 1400 11/28/20 0459  WBC 7.0 15.4* 14.3* 14.0*  NEUTROABS  --   --   --  11.8*  HGB 9.5* 7.9* 7.5* 7.9*  HCT 30.8* 26.0* 23.6* 24.6*  MCV 95.4 94.5 92.9 90.8  PLT 254 211 165 145*   Cardiac Enzymes: No results for input(s): CKTOTAL, CKMB, CKMBINDEX, TROPONINI in the last 168 hours. CBG: No results for input(s): GLUCAP in the  last 168 hours.  Iron Studies:  No results for input(s): IRON, TIBC, TRANSFERRIN, FERRITIN in the last 72 hours. Studies/Results: DG Abd Portable 1V  Result Date: 11/28/2020 CLINICAL DATA:  Abdominal distension abdominal pain EXAM: PORTABLE ABDOMEN - 1 VIEW COMPARISON:  November 07, 2020 CT assessment, abdominal plain film from November 15, 2020 FINDINGS: Decreased distension of bowel loops in the abdomen when compared to the study from November 15, 2020. Mild distension currently of scattered gas-filled loops of small bowel. Suggestion of small amount of proximal rectal gas. No acute skeletal process on limited assessment. Signs of basilar atelectasis. IMPRESSION: Decreased distension of bowel loops in the abdomen when compared to the study from November 15, 2020. Mild distension currently of scattered gas-filled loops of small bowel may reflect mild ileus. Suggestion of small amount of proximal rectal gas. Electronically Signed   By: Zetta Bills M.D.   On: 11/28/2020 16:15   . amiodarone  200 mg Oral Daily  . bismuth subsalicylate  30 mL Oral Q6H  . calcitRIOL  0.5 mcg Oral Daily  . Chlorhexidine Gluconate Cloth  6 each Topical Q0600  . Chlorhexidine Gluconate Cloth  6 each Topical Q0600  . darbepoetin (  ARANESP) injection - DIALYSIS  200 mcg Intravenous Q Mon-HD  . famotidine  20 mg Oral Daily  . feeding supplement  237 mL Oral BID BM  . finasteride  5 mg Oral Daily  . fludrocortisone  0.2 mg Oral Daily  . loperamide  2 mg Oral TID  . metoprolol tartrate  12.5 mg Oral BID  . midodrine  10 mg Oral TID with meals  . multivitamin  1 tablet Oral QHS  . psyllium  1 packet Oral QHS  . pyridostigmine  60 mg Oral Daily  . simvastatin  20 mg Oral QPM  . sodium chloride flush  3 mL Intravenous Q12H    BMET    Component Value Date/Time   NA 131 (L) 11/29/2020 0526   K 3.5 11/29/2020 0526   CL 95 (L) 11/29/2020 0526   CO2 25 11/29/2020 0526   GLUCOSE 136 (H) 11/29/2020 0526   BUN 44 (H)  11/29/2020 0526   CREATININE 5.04 (H) 11/29/2020 0526   CALCIUM 7.9 (L) 11/29/2020 0526   GFRNONAA 12 (L) 11/29/2020 0526   GFRAA 19 (L) 06/15/2018 0407   CBC    Component Value Date/Time   WBC 14.0 (H) 11/28/2020 0459   RBC 2.71 (L) 11/28/2020 0459   HGB 7.9 (L) 11/28/2020 0459   HCT 24.6 (L) 11/28/2020 0459   PLT 145 (L) 11/28/2020 0459   MCV 90.8 11/28/2020 0459   MCH 29.2 11/28/2020 0459   MCHC 32.1 11/28/2020 0459   RDW 19.3 (H) 11/28/2020 0459   LYMPHSABS 0.8 11/28/2020 0459   MONOABS 1.2 (H) 11/28/2020 0459   EOSABS 0.0 11/28/2020 0459   BASOSABS 0.0 11/28/2020 0459   Assessment/Plan:  1. RecurrentSyncope/hypotension; sig orthostasis/autonomic dysfunction:keeping even on HD, UOP adequate 1. Cont Midodrine, florinef, pyridostigmine 2. Maintain abd binder and compression stockings 3. Symptoms persist despite efforts, little to offer. Palliative on board 2. Colitis- GI following s/p cipro/flagyl, colonoscopy (w/ blood and colitis), and biopsies. Unclear cause. Biopsies of rectum and sigmoid colon show active nonspecific colitis w/ ulceration c/f medication induced (NSAIDs), ischemia, or stercoral proctitis. Thought to be ischemic per GI 3. Urinary retention- unclear if medication associated or BPH. Continue foley and likely urology eval outpt.  4. [facility] start on MWF at [facility]. 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 1. No amyloid deposits reported. 2. Does not have sufficient GFR to stay off HD after trial; resumed 12/28 3. No fluid removal needed; euvolemic and UOP intact 5. Anemia:of CKD w/ possible GI blood loss- s/p blood transfusion. No heparin with HD. GI on board, colonoscopyfindings as above. Rec 1u prbc 11/22 1. Transfuse as needed to keep Hgb  >7 2. On Aranesp weekly- iron stores low on 12/10 and repleted. IV Fe 6. CKD-MBD: calcitriol 0.41mcg daily. P ok w/o binder 7. Severe protein malnutrition:per primary, push protein 8. Vascular access- will need to f/u with Dr. Donnetta Hutching once stable for discharge. 9. A fib- on amiodarone and eliquis (off A/C as of right now) 10. Hyponatremia. Mild, stable and asymptomatic.   11. Hypokalemia: improved w/ repletion 12. Disposition- pending ability to safely ambulate before discharge  [place]

## 2020-11-29 NOTE — Treatment Plan (Signed)
Switching pt dialysis to tomorrow to get back on his MWF schedule.

## 2020-11-29 NOTE — Progress Notes (Signed)
PROGRESS NOTE  [name] TKZ:[id number] DOB: 17-Aug-1951 DOA: 11/02/2020 PCP: Patient, No Pcp Per  Brief History:  69yo male with history of diabetes mellitus, CKD stage V started recently on dialysis Monday Wednesday Friday and still making urine, recent A. fib and was restarted on amiodarone metoprolol and Eliquis was sent from SNF after he was noted to have an episode of syncope while getting up to go to hemodialysis.  On arrival to the ED his systolic blood pressures were in the 70 mmHg range. He is noted to have some diarrhea, but he has recently completed course of ciprofloxacin and Flagyl by 11/23. He was noted to have nonspecific colitis on pathology after recent colonoscopy on 11/17 and was started on prednisone taper which he continues to take at this time.  He was just discharged after admission from 10/12/20 to 11/01/20 when he was treated for syncope from orthostasis as well as colitis as discussed above.  He also required 5 units PRBC last admission.  Hewas started on Solu-Medrol 60 mg daily 11/19--per GI>>po 40 mg daily--wean by 10 mg per week--started 30 mg daily on 11/02/20.  He is now finished with steroids   ED Course:Patient had received 250 mL of normal saline fluid bolus with improvement in blood pressure readings. Manual blood pressures confirm systolics in the low 573 range and heart rates fluctuate in the low 100 range. He denies any palpitations or chest pains or shortness of breath. Laboratory data with stable hemoglobin levels noted and he is noted to be hypokalemic with potassium 2.9 and he has a lactic acid level 2.6. Covid testing ordered and pending.  Assessment/Plan: Orthostatic hypotension -continues to be orthostatic despite pyridostigmine, midodrine and florinef -this lead to repeat admission after d/c on 11/01/20 -10/13/20--EchoEF 22-02%RKYHC 1 diastolic dysfunction -add abdominal binder and knee stockings -decrease metoprolol to once  daily -not a candidate for Northera  Chronic Diarrhea/Colitis -rectal tube removed -improving with imodium -initially treated with cip/fl and then 4 weeks of steroid taper -11/21/20 GI pathogen panel neg -GI following -10/17/20 colonoscopy-area of moderately congested mucosa with ulceration was found in the sigmoid colon and in the descending colon.  Bllood noted in rectum, sigmoid and descending colon -11/21/20 colonoscopy--polyps in asc colon & 2 polyps hepatic flexure, extensive circumferential ulcers in distal sigmoid -Biopsies of rectum and sigmoid colon show active nonspecific colitis w/ ulceration c/f medication induced (NSAIDs), ischemia, or stercoral proctitis -GI suspects ischemic injury -Fecal elastase and qualitative fecal fat analysis ordered by GI  Anemia of CKD -had rectal bleed last admit requiring 5 units PRBC -11/21/20--received 1 unit this admit -multifactorial including CKD, blood draws, chronic disease -iron studies consistent with ACD  ESRD --started recently on dialysis MWF --HD on 12/28--unable to remove fluid --having intradialytic hypotension already on midodrine-->having difficulty removing fluid  Recent paroxysmal A. fib --started on amiodarone last admission --Eliquis--holdingdue to his rectal bleeding and acute blood loss anemia --Patient understands risks and benefits of holding anticoagulation and he seems to be reluctant to start it again-we will need close follow-up with his cardiologist/PCP. --metoprolol stopped due to orthostasis initially --start metoprolol 12.5 mg bid as pt having more afib RVR episodes from last admission  Hyponatremia, --sodiumis low continue to adjust dialysis.  Hyperlipidemia: --Continue statins.  Acute urinary retention - s/p foley placement 12/16. Foley removed 12/23 and he had been voiding. Unfortunately he developed acute urinary retention again on 12/25 and foley was replaced with >900  mL urine  relieved. Will leave foley in place for now until he can follow up outpatient with urology.   Goals of Care -long discussion with patient's daughter at bedside 11/29/20 Advance care planning, including the explanation and discussion of advance directives was carried out with the patient and family.  Code status including explanations of "Full Code" and "DNR" and alternatives were discussed in detail.  Discussion of end-of-life issues including but not limited palliative care, hospice care and the concept of hospice, other end-of-life care options, power of attorney for health care decisions, living wills, and physician orders for life-sustaining treatment were also discussed with the patient and family.  Total face to face time 36 minutes. -patient wants to continue full scope of care -patient wants to wait a little longer before considering possible transfer to tertiary care center    Status is: Inpatient  Remains inpatient appropriate because:IV treatments appropriate due to intensity of illness or inability to take PO  Remains orthostatic putting him at high risk for re-admission Also unable to remove fluid on HD   Dispo:             Patient From: [facility]             Planned Disposition: [place]             Expected discharge date: 12/04/2020             Medically stable for discharge: No         Family Communication:   Family at bedside  Consultants:    Code Status:  FULL / DNR  DVT Prophylaxis:  [name] Heparin /  Lovenox   Procedures: As Listed in Progress Note Above  Antibiotics: None   Subjective:   Objective: Vitals:   11/28/20 2046 11/29/20 0426 11/29/20 0500 11/29/20 1419  BP: 121/64 121/64  136/71  Pulse: 68 64  64  Resp: 18 18  18   Temp: (!) 97.5 F (36.4 C) 98.3 F (36.8 C)  98.1 F (36.7 C)  TempSrc: Oral   Oral  SpO2:  99%  100%  Weight:   67.7 kg   Height:        Intake/Output Summary  (Last 24 hours) at 11/29/2020 1843 Last data filed at 11/29/2020 1630 Gross per 24 hour  Intake 120 ml  Output 700 ml  Net -580 ml   Weight change: 2 kg Exam:   General:  Pt is alert, follows commands appropriately, not in acute distress  HEENT: No icterus, No thrush, No neck mass, [place]/AT  Cardiovascular: RRR, S1/S2, no rubs, no gallops  Respiratory: CTA bilaterally, no wheezing, no crackles, no rhonchi  Abdomen: Soft/+BS, non tender, non distended, no guarding  Extremities: No edema, No lymphangitis, No petechiae, No rashes, no synovitis   Data Reviewed: I have personally reviewed following labs and imaging studies Basic Metabolic Panel: Recent Labs  Lab 11/25/20 0754 11/26/20 0539 11/27/20 0514 11/28/20 0459 11/29/20 0526  NA 128* 128* 129* 130* 131*  K 4.2 3.8 3.7 2.9* 3.5  CL 99 98 98 94* 95*  CO2 18* 17* 18* 24 25  GLUCOSE 125* 116* 87 98 136*  BUN 56* 68* 75* 31* 44*  CREATININE 4.90* 5.73* 6.52* 3.51* 5.04*  CALCIUM 7.7* 8.0* 8.0* 7.5* 7.9*  PHOS 4.5 3.8 4.6 2.4* 3.3   Liver Function Tests: Recent Labs  Lab 11/25/20 0754 11/26/20 0539 11/27/20 0514 11/28/20 0459 11/29/20 0526  ALBUMIN 2.6* 2.9* 2.5* 2.3* 2.2*  No results for input(s): LIPASE, AMYLASE in the last 168 hours. No results for input(s): AMMONIA in the last 168 hours. Coagulation Profile: No results for input(s): INR, PROTIME in the last 168 hours. CBC: Recent Labs  Lab 11/23/20 0503 11/26/20 0539 11/27/20 1400 11/28/20 0459  WBC 7.0 15.4* 14.3* 14.0*  NEUTROABS  --   --   --  11.8*  HGB 9.5* 7.9* 7.5* 7.9*  HCT 30.8* 26.0* 23.6* 24.6*  MCV 95.4 94.5 92.9 90.8  PLT 254 211 165 145*   Cardiac Enzymes: No results for input(s): CKTOTAL, CKMB, CKMBINDEX, TROPONINI in the last 168 hours. BNP: Invalid input(s): POCBNP CBG: No results for input(s): GLUCAP in the last 168 hours. HbA1C: No results for input(s): HGBA1C in the last 72 hours. Urine analysis:    Component Value  Date/Time   COLORURINE YELLOW 10/12/2020 1622   APPEARANCEUR CLEAR 10/12/2020 1622   LABSPEC 1.008 10/12/2020 1622   PHURINE 8.0 10/12/2020 1622   GLUCOSEU >=500 (A) 10/12/2020 1622   HGBUR NEGATIVE 10/12/2020 1622   BILIRUBINUR NEGATIVE 10/12/2020 1622   KETONESUR NEGATIVE 10/12/2020 1622   PROTEINUR 100 (A) 10/12/2020 1622   NITRITE NEGATIVE 10/12/2020 1622   LEUKOCYTESUR NEGATIVE 10/12/2020 1622   Sepsis Labs: @LABRCNTIP (procalcitonin:4,lacticidven:4) ) Recent Results (from the past 240 hour(s))  Gastrointestinal Panel by PCR , Stool     Status: None   Collection Time: 11/21/20  3:46 PM   Specimen: Stool  Result Value Ref Range Status   Campylobacter species NOT DETECTED NOT DETECTED Final   Plesimonas shigelloides NOT DETECTED NOT DETECTED Final   Salmonella species NOT DETECTED NOT DETECTED Final   Yersinia enterocolitica NOT DETECTED NOT DETECTED Final   Vibrio species NOT DETECTED NOT DETECTED Final   Vibrio cholerae NOT DETECTED NOT DETECTED Final   Enteroaggregative E coli (EAEC) NOT DETECTED NOT DETECTED Final   Enteropathogenic E coli (EPEC) NOT DETECTED NOT DETECTED Final   Enterotoxigenic E coli (ETEC) NOT DETECTED NOT DETECTED Final   Shiga like toxin producing E coli (STEC) NOT DETECTED NOT DETECTED Final   Shigella/Enteroinvasive E coli (EIEC) NOT DETECTED NOT DETECTED Final   Cryptosporidium NOT DETECTED NOT DETECTED Final   Cyclospora cayetanensis NOT DETECTED NOT DETECTED Final   Entamoeba histolytica NOT DETECTED NOT DETECTED Final   Giardia lamblia NOT DETECTED NOT DETECTED Final   Adenovirus F40/41 NOT DETECTED NOT DETECTED Final   Astrovirus NOT DETECTED NOT DETECTED Final   Norovirus GI/GII NOT DETECTED NOT DETECTED Final   Rotavirus A NOT DETECTED NOT DETECTED Final   Sapovirus (I, II, IV, and V) NOT DETECTED NOT DETECTED Final    Comment: Performed at [facility], [address] [place], [place] [address]  SARS Coronavirus 2 by RT PCR  (hospital order, performed in [place] hospital lab) Nasopharyngeal Nasopharyngeal Swab     Status: None   Collection Time: 11/22/20 10:35 AM   Specimen: Nasopharyngeal Swab  Result Value Ref Range Status   SARS Coronavirus 2 NEGATIVE NEGATIVE Final    Comment: (NOTE) SARS-CoV-2 target nucleic acids are NOT DETECTED.  The SARS-CoV-2 RNA is generally detectable in upper and lower respiratory specimens during the acute phase of infection. The lowest concentration of SARS-CoV-2 viral copies this assay can detect is 250 copies / mL. A negative result does not preclude SARS-CoV-2 infection and should not be used as the sole basis for treatment or other patient management decisions.  A negative result may occur with improper specimen collection / handling, submission of  specimen other than nasopharyngeal swab, presence of viral mutation(s) within the areas targeted by this assay, and inadequate number of viral copies (<250 copies / mL). A negative result must be combined with clinical observations, patient history, and epidemiological information.  Fact Sheet for Patients:   StrictlyIdeas.no  Fact Sheet for Healthcare Providers: BankingDealers.co.za  This test is not yet approved or  cleared by the [place] FDA and has been authorized for detection and/or diagnosis of SARS-CoV-2 by FDA under an Emergency Use Authorization (EUA).  This EUA will remain in effect (meaning this test can be used) for the duration of the COVID-19 declaration under Section 564(b)(1) of the Act, 21 U.S.C. section 360bbb-3(b)(1), unless the authorization is terminated or revoked sooner.  Performed at [facility], [address], [place], [place] [address]      Scheduled Meds: . amiodarone  200 mg Oral Daily  . bismuth subsalicylate  30 mL Oral Q6H  . calcitRIOL  0.5 mcg Oral Daily  . Chlorhexidine Gluconate Cloth  6 each Topical Q0600  . Chlorhexidine  Gluconate Cloth  6 each Topical Q0600  . darbepoetin (ARANESP) injection - DIALYSIS  200 mcg Intravenous Q Mon-HD  . famotidine  20 mg Oral Daily  . feeding supplement  237 mL Oral BID BM  . finasteride  5 mg Oral Daily  . fludrocortisone  0.2 mg Oral Daily  . loperamide  2 mg Oral TID  . metoprolol tartrate  12.5 mg Oral BID  . midodrine  10 mg Oral TID with meals  . multivitamin  1 tablet Oral QHS  . psyllium  1 packet Oral QHS  . pyridostigmine  60 mg Oral Daily  . simvastatin  20 mg Oral QPM  . sodium chloride flush  3 mL Intravenous Q12H   Continuous Infusions: . sodium chloride    . sodium chloride    . sodium chloride      Procedures/Studies: MR BRAIN WO CONTRAST  Result Date: 11/16/2020 CLINICAL DATA:  Question deposition disease. Hypotension. Altered mental status. EXAM: MRI HEAD WITHOUT CONTRAST TECHNIQUE: Multiplanar, multiecho pulse sequences of the brain and surrounding structures were obtained without intravenous contrast. COMPARISON:  Head CT 01/25/2020 FINDINGS: Brain: Diffusion imaging does not show any acute or subacute infarction. No focal abnormality affects brainstem or cerebellum. Cerebral hemispheres show mild age related volume loss without evidence of small-vessel disease or large vessel infarction. No mass lesion, hemorrhage, hydrocephalus or extra-axial collection. Vascular: Major vessels at the base of the brain show flow. Skull and upper cervical spine: Negative Sinuses/Orbits: Clear/normal Other: None IMPRESSION: No acute or reversible finding. Mild age related volume loss. No evidence of small-vessel disease or large vessel infarction. Electronically Signed   By: Nelson Chimes M.D.   On: 11/16/2020 14:01   DG Abd Portable 1V  Result Date: 11/28/2020 CLINICAL DATA:  Abdominal distension abdominal pain EXAM: PORTABLE ABDOMEN - 1 VIEW COMPARISON:  November 07, 2020 CT assessment, abdominal plain film from November 15, 2020 FINDINGS: Decreased distension of  bowel loops in the abdomen when compared to the study from November 15, 2020. Mild distension currently of scattered gas-filled loops of small bowel. Suggestion of small amount of proximal rectal gas. No acute skeletal process on limited assessment. Signs of basilar atelectasis. IMPRESSION: Decreased distension of bowel loops in the abdomen when compared to the study from November 15, 2020. Mild distension currently of scattered gas-filled loops of small bowel may reflect mild ileus. Suggestion of small amount of proximal rectal gas. Electronically  Signed   By: Zetta Bills M.D.   On: 11/28/2020 16:15   DG Abd Portable 1V  Result Date: 11/15/2020 CLINICAL DATA:  Abdominal distension EXAM: PORTABLE ABDOMEN - 1 VIEW COMPARISON:  10/25/2020 FINDINGS: The subdiaphragmatic region and right flank are excluded from view. Multiple gas-filled prominent loops of large and small bowel are seen throughout the visualized abdomen in keeping with un underlying ileus. No gross free intraperitoneal gas. Gas and stool is seen within the rectal vault. No organomegaly. No acute bone abnormality. IMPRESSION: Mild ileus. Electronically Signed   By: Fidela Salisbury MD   On: 11/15/2020 04:15   CT Angio Abd/Pel w/ and/or w/o  Result Date: 11/07/2020 CLINICAL DATA:  Evaluate for mesenteric ischemia. End-stage renal disease on dialysis. Bowel wall thickening on exam from 10/12/2020. EXAM: CTA ABDOMEN AND PELVIS WITHOUT AND WITH CONTRAST TECHNIQUE: Multidetector CT imaging of the abdomen and pelvis was performed using the standard protocol during bolus administration of intravenous contrast. Multiplanar reconstructed images and MIPs were obtained and reviewed to evaluate the vascular anatomy. CONTRAST:  38mL OMNIPAQUE IOHEXOL 350 MG/ML SOLN COMPARISON:  10/22/2020 FINDINGS: VASCULAR Aorta: Mild atherosclerotic disease in the abdominal aorta without aneurysm or dissection. Celiac: Celiac trunk is patent without significant stenosis.  Focal dilatation of the trunk measures up to 9 mm. Main branch vessels are patent. SMA: Replaced right hepatic artery. SMA is widely patent without significant stenosis. No evidence for aneurysm or dissection. Renals: Main right renal artery is widely patent without stenosis, aneurysm or dissection. There is small accessory right renal artery. High-grade focal narrowing in the proximal main left renal artery without significant plaque in this area. Findings could be related to a dissection but indeterminate. Small accessory left renal artery is patent. IMA: Patent Inflow: Atherosclerotic disease involving the proximal left common iliac artery without significant stenosis. Common iliac arteries are patent bilaterally. Disease and stenosis involving the right internal iliac artery. Left internal iliac artery is patent. Bilateral external iliac arteries are widely patent. Proximal Outflow: Proximal femoral arteries are patent bilaterally. Veins: Main portal venous system is patent. Limited evaluation of the superior cavoatrial junction on the arterial phase imaging due to non-opacified blood in this area. IVC and renal veins are patent. Narrowing of the left common iliac vein from the right common iliac artery is a normal anatomic variant. Review of the MIP images confirms the above findings. NON-VASCULAR Lower chest: Small bilateral pleural effusions with compressive atelectasis. Hepatobiliary: High-density material in the gallbladder are suggestive for small stones. The gallbladder is moderately distended without definite inflammatory changes. There is colon anterior to the liver. No discrete liver lesion. No biliary dilatation. Gallbladder distension has minimally changed since the previous examination. Pancreas: Unremarkable. No pancreatic ductal dilatation or surrounding inflammatory changes. Spleen: Normal in size without focal abnormality. Adrenals/Urinary Tract: Normal appearance of the adrenal glands. Both  kidneys are small with scattered calcifications. Findings are suggestive for nonobstructive renal calculi. Stomach/Bowel: Normal appearance of the stomach and duodenum. There is a rectal catheter with an inflated balloon. Sigmoid colon is moderately distended with mild wall thickening. However, the sigmoid wall thickening is less impressive on the venous phase imaging. Cecum is distended and contains a large amount of stool. Transverse colon is distended with gas. Lymphatic: No significant lymph node enlargement in the abdomen or pelvis. Reproductive: Stable appearance of the prostate. Other: Diffuse subcutaneous edema. Small amount of pelvic ascites which is new. Trace ascites in the left lower quadrant of the abdomen. Trace ascites in  the upper abdomen. Negative for free air. Musculoskeletal: Chronic disc space narrowing with endplate changes at U8-Q9. Stable disc space narrowing at L5-S1. IMPRESSION: VASCULAR 1. Atherosclerotic disease in the abdominal aorta without aneurysm or significant stenosis. Aortic Atherosclerosis (ICD10-I70.0). 2. Main mesenteric arteries are patent without significant stenosis. No evidence to suggest chronic mesenteric ischemia. 3. Multiple renal arteries as described. Focal narrowing the proximal left main renal artery that could be related to atherosclerotic disease or focal dissection in this area. NON-VASCULAR 1. The sigmoid colon has been partially decompressed since 10/22/2020 and placement of the rectal tube. There continues to be gaseous distension of the sigmoid colon and transverse colon. Mild wall thickening in the sigmoid colon is nonspecific but may be related to the partial decompression rather than infectious or inflammatory process. Findings are suggestive for an underlying colonic ileus. 2. Bilateral kidneys are atrophic with bilateral calcifications. Findings are compatible with history of end-stage renal disease. Calcifications could represent nonobstructive renal  calculi. 3. Diffuse subcutaneous edema with small amount of ascites, most prominent in the pelvis. 4. Significant disc space disease at L2-L3. 5. Bilateral pleural effusions with compressive atelectasis at the lung bases. 6. Cholelithiasis with moderate gallbladder distension. Electronically Signed   By: Markus Daft M.D.   On: 11/07/2020 17:58    Orson Eva, DO  [facility]  If 7PM-7AM, please contact night-coverage [email or web address] Password [facility] 11/29/2020, 6:43 PM   LOS: 27 days

## 2020-11-29 NOTE — Progress Notes (Signed)
Subjective: Patient was sleeping when I entered the room but easily awoken.  He has had one bowel movement today without blood or melena.  Denies abdominal pain, nausea, vomiting.  No other overt GI complaints.  States he still gets lightheaded and "my blood pressure drops" when sitting up on side of the bed.  This seems to be his primary complaint.  Objective: Vital signs in last 24 hours: Temp:  [97.5 F (36.4 C)-98.3 F (36.8 C)] 98.3 F (36.8 C) (12/30 0426) Pulse Rate:  [62-70] 64 (12/30 0426) Resp:  [18] 18 (12/30 0426) BP: (110-123)/(58-64) 121/64 (12/30 0426) SpO2:  [95 %-99 %] 99 % (12/30 0426) Weight:  [67.7 kg] 67.7 kg (12/30 0500) Last BM Date: 11/28/20 General:   Alert and oriented, pleasant.  Appears frail and thin. Head:  Normocephalic and atraumatic. Eyes:  No icterus, sclera clear. Conjuctiva pink.  Heart:  S1, S2 present, no murmurs noted.  Lungs: Clear to auscultation bilaterally, without wheezing, rales, or rhonchi.  Abdomen:  Bowel sounds present, soft, non-tender, non-distended. No HSM or hernias noted. No rebound or guarding. Msk:  Symmetrical without gross deformities. Pulses:  Normal bilateral DP pulses noted. Extremities:  Without clubbing or edema. Neurologic:  Alert and  oriented x4;  grossly normal neurologically. Psych:  Alert and cooperative. Normal mood and affect.  Intake/Output from previous day: 12/29 0701 - 12/30 0700 In: 840 [P.O.:840] Out: 400 [Urine:400] Intake/Output this shift: No intake/output data recorded.  Lab Results: Recent Labs    11/27/20 1400 11/28/20 0459  WBC 14.3* 14.0*  HGB 7.5* 7.9*  HCT 23.6* 24.6*  PLT 165 145*   BMET Recent Labs    11/27/20 0514 11/28/20 0459 11/29/20 0526  NA 129* 130* 131*  K 3.7 2.9* 3.5  CL 98 94* 95*  CO2 18* 24 25  GLUCOSE 87 98 136*  BUN 75* 31* 44*  CREATININE 6.52* 3.51* 5.04*  CALCIUM 8.0* 7.5* 7.9*   LFT Recent Labs    11/27/20 0514 11/28/20 0459 11/29/20 0526   ALBUMIN 2.5* 2.3* 2.2*   PT/INR No results for input(s): LABPROT, INR in the last 72 hours. Hepatitis Panel No results for input(s): HEPBSAG, HCVAB, HEPAIGM, HEPBIGM in the last 72 hours.   Studies/Results: DG Abd Portable 1V  Result Date: 11/28/2020 CLINICAL DATA:  Abdominal distension abdominal pain EXAM: PORTABLE ABDOMEN - 1 VIEW COMPARISON:  November 07, 2020 CT assessment, abdominal plain film from November 15, 2020 FINDINGS: Decreased distension of bowel loops in the abdomen when compared to the study from November 15, 2020. Mild distension currently of scattered gas-filled loops of small bowel. Suggestion of small amount of proximal rectal gas. No acute skeletal process on limited assessment. Signs of basilar atelectasis. IMPRESSION: Decreased distension of bowel loops in the abdomen when compared to the study from November 15, 2020. Mild distension currently of scattered gas-filled loops of small bowel may reflect mild ileus. Suggestion of small amount of proximal rectal gas. Electronically Signed   By: Zetta Bills M.D.   On: 11/28/2020 16:15    Assessment: 69 year old male with history of diarrhea and rectal bleeding(bleeding has resolved), extensive work-up and prolonged hospitalizations. Incomplete colonoscopy during prior admission, poor prep, blood noted in the rectum, sigmoid, descending colon, splenic flexure.Findings suspicious for segmental or ischemic colitis. Pathology without changes or features of IBD, query drug-induced or ischemic. CMV stains negative.   Diarrhea: Stool studies negative multiple times. He has not responded to empiric therapy with antibiotics, steroids, pancreatic enzymes, Colestid,  octreotide. Colonoscopy December 22 showing normal terminal ileum, multilobulated15 millimeters polyp removed piecemeal from sigmoid colon, 2 polyps from the hepatic flexure removed, random colon biopsies negative for microscopic colitis. Extensive sigmoid colon  ulceration. Pathology with tubular adenomas. Path returned but without significant insight and still feel as if dealing with ischemic etiology.   Fiber supplementation started yesterday. He has not had worsening of diarrhea. Last documented stool Bristol scale # 6 yesterday morning. Patient reports to me 2-3 per day. Regimen now includes pepto QID, imodium 2 mg TID, and Metamucil one packet daily. Fecal and pancreatic elastase remains in process.   Anemia: appears to be multifactorial in setting of chronic disease but also concern for occult GI blood loss. Iron low at 9. Ferritin elevated. Received 1 unit PRBCs this admission on 12/22. Hgb 7.9 today. No overt GI bleeding. Most recent admission 11/14-11/26, he received 5 units of PRBCs. From what I can tell, he has received a total of 6 units PRBCs since Nov 2021. Query if EGD would be helpful to exclude any occult lesion in light of transfusion-dependent state, although discussed with Dr. Laural Golden who feels without active bleed this is not an urgent IP need.   Nausea/Vomiting: Episode after lunch yesterday, none since. Abdominal XRay with decreasing amount of small bowel gas compared to study from 13 days ago and no further episodes.  He ate 50% of his dinner.  We will continue to monitor for changes.  Orthostatic hypotension: management per attending. We feel he would be best served with tertiary care transfer (such as [facility]) given persistent symptoms despite GI improvement.   Plan: 1. Discuss if need for EGD with Dr. Gala Romney (not candidate today due to diet) 2. Follow for fecal fat and pancreatic elastase results 3. Continue current treatments: Pepto qid, Imodium tid, Metamucil daily 4. Strict I&Os 5. Monitor for recurrent N/V 6. Monitor for any overt GI bleed 7. Notify GI if acute worsening of loose stools despite treatment 8. Supportive measures   Thank you for allowing [place] to participate in the care of [facility] A  Aggarwal  Walden Field, DNP, AGNP-C Adult & Gerontological Nurse Practitioner [facility]    LOS: 27 days    11/29/2020, 7:35 AM

## 2020-11-30 DIAGNOSIS — K529 Noninfective gastroenteritis and colitis, unspecified: Secondary | ICD-10-CM | POA: Diagnosis not present

## 2020-11-30 DIAGNOSIS — N186 End stage renal disease: Secondary | ICD-10-CM | POA: Diagnosis not present

## 2020-11-30 DIAGNOSIS — Z7189 Other specified counseling: Secondary | ICD-10-CM | POA: Diagnosis not present

## 2020-11-30 DIAGNOSIS — I951 Orthostatic hypotension: Secondary | ICD-10-CM | POA: Diagnosis not present

## 2020-11-30 LAB — RENAL FUNCTION PANEL
Albumin: 2.2 g/dL — ABNORMAL LOW (ref 3.5–5.0)
Anion gap: 12 (ref 5–15)
BUN: 54 mg/dL — ABNORMAL HIGH (ref 8–23)
CO2: 22 mmol/L (ref 22–32)
Calcium: 8 mg/dL — ABNORMAL LOW (ref 8.9–10.3)
Chloride: 94 mmol/L — ABNORMAL LOW (ref 98–111)
Creatinine, Ser: 6.03 mg/dL — ABNORMAL HIGH (ref 0.61–1.24)
GFR, Estimated: 9 mL/min — ABNORMAL LOW (ref >60)
Glucose, Bld: 97 mg/dL (ref 70–99)
Phosphorus: 3.6 mg/dL (ref 2.5–4.6)
Potassium: 3.4 mmol/L — ABNORMAL LOW (ref 3.5–5.1)
Sodium: 128 mmol/L — ABNORMAL LOW (ref 135–145)

## 2020-11-30 NOTE — NC FL2 (Signed)
[place] LEVEL OF CARE SCREENING TOOL     IDENTIFICATION  Patient Name: [name] Birthdate: [birth year] Sex: male Admission Date (Current Location): 11/02/2020  [facility] and [place] Number:  [employer] and Address:  [facility] [address] [place]      Provider Number: [id number]  Attending Physician Name and Address:  Orson Eva, MD  Relative Name and Phone Number:  Marikay Alar [phone]    Current Level of Care: Hospital Recommended Level of Care: [place] Prior Approval Number:    Date Approved/Denied: 10/15/20 PASRR Number: [id number]  Discharge Plan: SNF    Current Diagnoses: Patient Active Problem List   Diagnosis Date Noted  . Goals of care, counseling/discussion 11/29/2020  . Abdominal distension   . Colitis   . ESRD on hemodialysis ([place])   . Syncope due to orthostatic hypotension 11/02/2020  . Rectal bleeding   . Acute on chronic anemia   . Diarrhea   . Orthostatic hypotension 10/13/2020  . Chronic diarrhea 10/13/2020  . Colitis presumed infectious 10/13/2020  . Pressure injury of skin 10/13/2020  . ESRD on dialysis ([place]) 10/12/2020  . Unspecified atrial fibrillation ([place]) 10/12/2020  . Syncope 10/12/2020  . Small bowel obstruction ([place])   . Type 2 diabetes mellitus ([place]) 06/11/2018  . Peripheral neuropathy 06/11/2018  . Ankle syndesmosis disruption, right, initial encounter 06/11/2018  . AKI (acute kidney injury) ([place])   . Dehydration   . Open right ankle fracture 06/08/2018  . Fracture, ankle 06/08/2018  . Open displaced fracture of medial malleolus of tibia, type III 06/08/2018    Orientation RESPIRATION BLADDER Height & Weight     Self,Time,Situation,Place  Normal Continent Weight: 143 lb 4.8 oz (65 kg) Height:  6\' 2"  (188 cm)  BEHAVIORAL SYMPTOMS/MOOD NEUROLOGICAL BOWEL NUTRITION STATUS      Incontinent Diet (service appropriate? Yes; Fluid consistency:  Thin)  AMBULATORY STATUS COMMUNICATION OF NEEDS Skin   Extensive Assist Verbally Normal                       Personal Care Assistance Level of Assistance  Bathing,Feeding,Dressing Bathing Assistance: Limited assistance Feeding assistance: Limited assistance Dressing Assistance: Maximum assistance     Functional Limitations Info  Sight,Hearing,Speech Sight Info: Adequate Hearing Info: Adequate Speech Info: Adequate    SPECIAL CARE FACTORS FREQUENCY  PT (By licensed PT)     PT Frequency: 5x              Contractures Contractures Info: Not present    Additional Factors Info  Code Status,Allergies Code Status Info: Full Allergies Info: N/A           Current Medications (11/30/2020):  This is the current hospital active medication list Current Facility-Administered Medications  Medication Dose Route Frequency Provider Last Rate Last Admin  . 0.9 %  sodium chloride infusion  100 mL Intravenous PRN Donato Heinz, MD      . 0.9 %  sodium chloride infusion  100 mL Intravenous PRN Donato Heinz, MD      . 0.9 %  sodium chloride infusion  250 mL Intravenous PRN Manuella Ghazi, Pratik D, DO      . acetaminophen (TYLENOL) tablet 650 mg  650 mg Oral Q6H PRN Heath Lark D, DO   650 mg at 11/23/20 0848   Or  . acetaminophen (TYLENOL) suppository 650 mg  650 mg Rectal Q6H PRN Manuella Ghazi, Pratik D, DO      .  amiodarone (PACERONE) tablet 200 mg  200 mg Oral Daily Manuella Ghazi, Pratik D, DO   200 mg at 11/29/20 1053  . bismuth subsalicylate (PEPTO BISMOL) 262 MG/15ML suspension 30 mL  30 mL Oral Q6H Mahala Menghini, PA-C   30 mL at 11/30/20 6384  . calcitRIOL (ROCALTROL) capsule 0.5 mcg  0.5 mcg Oral Daily Donato Heinz, MD   0.5 mcg at 11/29/20 1115  . Chlorhexidine Gluconate Cloth 2 % PADS 6 each  6 each Topical Q0600 Heath Lark D, DO   6 each at 11/30/20 [phone]  . Chlorhexidine Gluconate Cloth 2 % PADS 6 each  6 each Topical Q0600 Corliss Parish, MD   6 each at 11/30/20 [phone]   . chlorproMAZINE (THORAZINE) tablet 10 mg  10 mg Oral QID PRN Johnson, Clanford L, MD      . Darbepoetin Alfa (ARANESP) injection 200 mcg  200 mcg Intravenous Q Mon-HD Donato Heinz, MD   200 mcg at 11/27/20 2230  . famotidine (PEPCID) tablet 20 mg  20 mg Oral Daily Johnson, Clanford L, MD   20 mg at 11/29/20 1053  . feeding supplement (ENSURE ENLIVE / ENSURE PLUS) liquid 237 mL  237 mL Oral BID BM Shah, Pratik D, DO   237 mL at 11/24/20 1603  . finasteride (PROSCAR) tablet 5 mg  5 mg Oral Daily Jonetta Osgood, MD   5 mg at 11/29/20 1053  . fludrocortisone (FLORINEF) tablet 0.2 mg  0.2 mg Oral Daily Corliss Parish, MD   0.2 mg at 11/29/20 1053  . heparin injection 1,400 Units  20 Units/kg Dialysis PRN Donato Heinz, MD   1,400 Units at 11/27/20 2115  . heparin injection 3,800 Units  3,800 Units Dialysis PRN Donato Heinz, MD   3,800 Units at 11/28/20 0020  . lidocaine (PF) (XYLOCAINE) 1 % injection 5 mL  5 mL Intradermal PRN Donato Heinz, MD      . lidocaine-prilocaine (EMLA) cream 1 application  1 application Topical PRN Donato Heinz, MD      . loperamide (IMODIUM) capsule 2 mg  2 mg Oral TID Mahala Menghini, PA-C   2 mg at 11/29/20 2051  . metoprolol tartrate (LOPRESSOR) tablet 12.5 mg  12.5 mg Oral Benay Pike, MD   12.5 mg at 11/29/20 2050  . midodrine (PROAMATINE) tablet 10 mg  10 mg Oral TID with meals Wynetta Emery, Clanford L, MD   10 mg at 11/30/20 0932  . multivitamin (RENA-VIT) tablet 1 tablet  1 tablet Oral QHS Shah, Pratik D, DO   1 tablet at 11/29/20 2049  . ondansetron (ZOFRAN) tablet 4 mg  4 mg Oral Q6H PRN Manuella Ghazi, Pratik D, DO       Or  . ondansetron (ZOFRAN) injection 4 mg  4 mg Intravenous Q6H PRN Manuella Ghazi, Pratik D, DO   4 mg at 11/28/20 1439  . pentafluoroprop-tetrafluoroeth (GEBAUERS) aerosol 1 application  1 application Topical PRN Donato Heinz, MD      . psyllium (HYDROCIL/METAMUCIL) 1 packet  1 packet Oral QHS Rogene Houston, MD   1  packet at 11/29/20 2050  . pyridostigmine (MESTINON) tablet 60 mg  60 mg Oral Daily Donato Heinz, MD   60 mg at 11/29/20 1053  . simvastatin (ZOCOR) tablet 20 mg  20 mg Oral QPM Shah, Pratik D, DO   20 mg at 11/29/20 1820  . sodium chloride flush (NS) 0.9 % injection 3 mL  3 mL Intravenous Q12H Shah, Pratik D, DO   3 mL  at 11/29/20 2132  . sodium chloride flush (NS) 0.9 % injection 3 mL  3 mL Intravenous PRN Manuella Ghazi, Pratik D, DO   3 mL at 11/26/20 1045     Discharge Medications: Please see discharge summary for a list of discharge medications.  Relevant Imaging Results:  Relevant Lab Results:   Additional Information Pt SSN: [id number]  Natasha Bence, LCSW

## 2020-11-30 NOTE — Procedures (Signed)
   HEMODIALYSIS TREATMENT NOTE:  Withdrawn and non-conversant with flat affect.  3 hour low-heparin HD completed via RIJ TDC. Cath unable to tolerate prescribed flow; suspect fibrin sheath.  Will instill Activase again prior to next treatment - works best after a 4 hour dwell.    Kept even / no fluid removed as per order.  All blood was returned.  No changes from pre-dialysis assessment.  Rockwell Alexandria, RN

## 2020-11-30 NOTE — Progress Notes (Addendum)
Patient ID: TREVONNE NYLAND, male   DOB: [birth year], 69 y.o.   MRN: [id number] S:   No c/o this AM, GOC notes reviewed -- Full Code at this time  Remains orthostatic by BPs/HR and symptoms even when sitting  Dialysis today  PT notes: limited balance in sitting  O:BP 136/68 (BP Location: Right Arm)   Pulse (!) 56   Temp 97.6 F (36.4 C)   Resp 18   Ht 6\' 2"  (1.88 m)   Wt 65 kg   SpO2 100%   BMI 18.40 kg/m   Intake/Output Summary (Last 24 hours) at 11/30/2020 0952 Last data filed at 11/30/2020 0502 Gross per 24 hour  Intake 240 ml  Output 550 ml  Net -310 ml   Intake/Output: I/O last 3 completed shifts: In: 360 [P.O.:360] Out: 950 [Urine:950]    Intake/Output this shift:  No intake/output data recorded. Weight change: -2.7 kg Gen: NAD CVS: RRR Resp: bilateral chest rise w/ no iwob Abd: +BS, soft, NT/ND Ext: trace pedal edema TDC R IJ  Recent Labs  Lab 11/24/20 0721 11/25/20 0754 11/26/20 0539 11/27/20 0514 11/28/20 0459 11/29/20 0526 11/30/20 0621  NA 129* 128* 128* 129* 130* 131* 128*  K 3.8 4.2 3.8 3.7 2.9* 3.5 3.4*  CL 100 99 98 98 94* 95* 94*  CO2 18* 18* 17* 18* 24 25 22   GLUCOSE 136* 125* 116* 87 98 136* 97  BUN 50* 56* 68* 75* 31* 44* 54*  CREATININE 4.12* 4.90* 5.73* 6.52* 3.51* 5.04* 6.03*  ALBUMIN 2.3* 2.6* 2.9* 2.5* 2.3* 2.2* 2.2*  CALCIUM 7.7* 7.7* 8.0* 8.0* 7.5* 7.9* 8.0*  PHOS 5.0* 4.5 3.8 4.6 2.4* 3.3 3.6   Liver Function Tests: Recent Labs  Lab 11/28/20 0459 11/29/20 0526 11/30/20 0621  ALBUMIN 2.3* 2.2* 2.2*   No results for input(s): LIPASE, AMYLASE in the last 168 hours. No results for input(s): AMMONIA in the last 168 hours. CBC: Recent Labs  Lab 11/26/20 0539 11/27/20 1400 11/28/20 0459  WBC 15.4* 14.3* 14.0*  NEUTROABS  --   --  11.8*  HGB 7.9* 7.5* 7.9*  HCT 26.0* 23.6* 24.6*  MCV 94.5 92.9 90.8  PLT 211 165 145*   Cardiac Enzymes: No results for input(s): CKTOTAL, CKMB, CKMBINDEX, TROPONINI in the last 168  hours. CBG: No results for input(s): GLUCAP in the last 168 hours.  Iron Studies:  No results for input(s): IRON, TIBC, TRANSFERRIN, FERRITIN in the last 72 hours. Studies/Results: DG Abd Portable 1V  Result Date: 11/28/2020 CLINICAL DATA:  Abdominal distension abdominal pain EXAM: PORTABLE ABDOMEN - 1 VIEW COMPARISON:  November 07, 2020 CT assessment, abdominal plain film from November 15, 2020 FINDINGS: Decreased distension of bowel loops in the abdomen when compared to the study from November 15, 2020. Mild distension currently of scattered gas-filled loops of small bowel. Suggestion of small amount of proximal rectal gas. No acute skeletal process on limited assessment. Signs of basilar atelectasis. IMPRESSION: Decreased distension of bowel loops in the abdomen when compared to the study from November 15, 2020. Mild distension currently of scattered gas-filled loops of small bowel may reflect mild ileus. Suggestion of small amount of proximal rectal gas. Electronically Signed   By: Zetta Bills M.D.   On: 11/28/2020 16:15   . amiodarone  200 mg Oral Daily  . bismuth subsalicylate  30 mL Oral Q6H  . calcitRIOL  0.5 mcg Oral Daily  . Chlorhexidine Gluconate Cloth  6 each Topical Q0600  . Chlorhexidine Gluconate Cloth  6 each Topical Q0600  . darbepoetin (ARANESP) injection - DIALYSIS  200 mcg Intravenous Q Mon-HD  . famotidine  20 mg Oral Daily  . feeding supplement  237 mL Oral BID BM  . finasteride  5 mg Oral Daily  . fludrocortisone  0.2 mg Oral Daily  . loperamide  2 mg Oral TID  . metoprolol tartrate  12.5 mg Oral QHS  . midodrine  10 mg Oral TID with meals  . multivitamin  1 tablet Oral QHS  . psyllium  1 packet Oral QHS  . pyridostigmine  60 mg Oral Daily  . simvastatin  20 mg Oral QPM  . sodium chloride flush  3 mL Intravenous Q12H    BMET    Component Value Date/Time   NA 128 (L) 11/30/2020 0621   K 3.4 (L) 11/30/2020 0621   CL 94 (L) 11/30/2020 0621   CO2 22  11/30/2020 0621   GLUCOSE 97 11/30/2020 0621   BUN 54 (H) 11/30/2020 0621   CREATININE 6.03 (H) 11/30/2020 0621   CALCIUM 8.0 (L) 11/30/2020 0621   GFRNONAA 9 (L) 11/30/2020 0621   GFRAA 19 (L) 06/15/2018 0407   CBC    Component Value Date/Time   WBC 14.0 (H) 11/28/2020 0459   RBC 2.71 (L) 11/28/2020 0459   HGB 7.9 (L) 11/28/2020 0459   HCT 24.6 (L) 11/28/2020 0459   PLT 145 (L) 11/28/2020 0459   MCV 90.8 11/28/2020 0459   MCH 29.2 11/28/2020 0459   MCHC 32.1 11/28/2020 0459   RDW 19.3 (H) 11/28/2020 0459   LYMPHSABS 0.8 11/28/2020 0459   MONOABS 1.2 (H) 11/28/2020 0459   EOSABS 0.0 11/28/2020 0459   BASOSABS 0.0 11/28/2020 0459   Assessment/Plan:  1. RecurrentSyncope/hypotension; sig orthostasis/autonomic dysfunction:keeping even on HD, UOP adequate 1. Cont Midodrine, florinef, pyridostigmine 2. Maintain abd binder and compression stockings 3. Symptoms persist despite efforts, little to offer. Palliative on board and full code / full scope at this time 4. Maybe he could see EP locally as outpt? 2. Colitis- GI following s/p cipro/flagyl, colonoscopy (w/ blood and colitis), and biopsies. Unclear cause. Biopsies of rectum and sigmoid colon show active nonspecific colitis w/ ulceration c/f medication induced (NSAIDs), ischemia, or stercoral proctitis. Thought to be ischemic per GI 3. Urinary retention- unclear if medication associated or BPH. Continue foley and likely urology eval outpt.  4. [facility]. 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 1. No amyloid deposits reported. 2. Does not have sufficient GFR to stay off HD after trial; resumed 12/28 3. No fluid removal needed; euvolemic and UOP intact 4. Despite aggressive therapies and time to  address his acute issues, it appears he remains too orthostatic for chair HD at this point based on PT notes.  I rec involving CM and look for SNF/otpt HD unit that can facilitate stretcher HD. 5. Anemia:of CKD w/ possible GI blood loss- s/p blood transfusion. No heparin with HD. GI on board, colonoscopyfindings as above. Rec 1u prbc 11/22 1. Transfuse as needed to keep Hgb >7 2. On Aranesp weekly- iron stores low on 12/10 and repleted. IV Fe 6. CKD-MBD: calcitriol 0.69mcg daily. P ok w/o binder 7. Severe protein malnutrition:per primary, push protein 8. Vascular access- will need to f/u with Dr. Donnetta Hutching once stable for discharge. 9. A fib- on amiodarone and eliquis (off A/C  as of right now) 10. Hyponatremia. Mild, stable and asymptomatic.   11. Hypokalemia: improved w/ repletion 12. Disposition- pending ability to safely ambulate before discharge -- or as above, will need SNF/Stretcher option if can't tolerated chair HD  Rexene Agent  [name]

## 2020-11-30 NOTE — Progress Notes (Signed)
PROGRESS NOTE  [name] OVF:[id number] DOB: [birth year] DOA: 11/02/2020 PCP: Patient, No Pcp Per  Brief History: 69yo male with history of diabetes mellitus, CKD stage V started recently on dialysis Monday Wednesday Friday and still making urine, recent A. fib and was restarted on amiodarone metoprolol and Eliquis was sent fromSNFafter he was noted to have an episode of syncope while getting up to go to hemodialysis.On arrival to the ED his systolic blood pressures were in the 70 mmHg range. He is noted to have some diarrhea, but he has recently completed course of ciprofloxacin and Flagyl by 11/23. He was noted to have nonspecific colitis on pathology after recent colonoscopy on 11/17 and was started on prednisone taper which he continues to take at this time.He was just discharged after admission from 10/12/20 to 11/01/20 when he was treated for syncope from orthostasis as well as colitis as discussed above. He also required 5 units PRBC last admission. Hewas started on Solu-Medrol 60 mg daily 11/19--per GI>>po 40 mg daily--wean by 10 mg per week--started30 mg daily on 11/02/20. He is now finished with steroids   ED Course:Patient had received 250 mL of normal saline fluid bolus with improvement in blood pressure readings. Manual blood pressures confirm systolics in the low 841 range and heart rates fluctuate in the low 100 range. He denies any palpitations or chest pains or shortness of breath. Laboratory data with stable hemoglobin levels noted and he is noted to be hypokalemic with potassium 2.9 and he has a lactic acid level 2.6. Covid testing ordered and pending.  Assessment/Plan: Orthostatic hypotension -continues to be orthostatic despite pyridostigmine, midodrine and florinef -this lead to repeat admission after d/c on 11/01/20 -10/13/20--EchoEF 66-06%TKZSW 1 diastolic dysfunction -added abdominal binder and knee stockings -decrease metoprolol to once  daily -am cortisol 23.1 -TSH 1.912 -not a candidate for Northera -case discussed with renal, Dr. Libby Maw d/c with stretcher HD until able to tolerate sitting and standing -11/30/20--discussed case with Loleta Books, Dr. Ace Gins additional to offer, agrees with our current treatment  Chronic Diarrhea/Colitis -presented with diarrhea and LGIB last admission in Nov 2021 -improving with imodium -initially treated with cip/fl and then 4 weeks of steroid taper -10/15/20, 11/06/20 and 11/21/20 GI pathogen panel neg -11/02/20 Cdiff neg -11/05/20 Stool O&P--neg -GI following -10/17/20 colonoscopy-area of moderately congested mucosa with ulceration was found in the sigmoid colon and in the descending colon.  Bllood noted in rectum, sigmoid and descending colon -11/21/20 colonoscopy--polyps in asc colon & 2 polyps hepatic flexure, extensive circumferential ulcers in distal sigmoid -10/17/20 Biopsies of rectum and sigmoid colon show active nonspecific colitis w/ ulceration c/f medication induced (NSAIDs), ischemia, or stercoral proctitis -12//22/21 TI and colon biopsies--neg for inflammation; +tubular adenmoa -GI suspects ischemic injury -Fecal elastase and qualitative fecal fat analysisordered by GI -11/30/20--discussed case with Loleta Books, Dr. Ace Gins additional to offer, agrees with our current treatment   Anemia of CKD -had rectal bleed last admit requiring 5 units PRBC -11/21/20--received 1 unit this admit -multifactorial including CKD, blood draws, chronic disease -iron studies consistent with ACD  ESRD -Biopsy results from outpatient nephrologist moderate to severe arterial nephrosclerosis with 75% global glomerulosclerosis, focal and segmental glomerular atrophic scarring and diffuse moderate to severe tubulointerstitial scarring --started recently on dialysis MWF --HD on 12/28--unable to remove fluid --having intradialytic hypotension already on midodrine-->having  difficulty removing fluid  Recent paroxysmal A. fib --started on amiodaronelast admission --Eliquis--holdingdue to his rectal bleeding and  acute blood loss anemia --Patient understands risks and benefits of holding anticoagulation and he seems to be reluctant to start it again-we will need close follow-up with his cardiologist/PCP. --metoprolol stopped due to orthostasis initially --start metoprolol 12.5 mg bid as pt having more afib RVR episodesfrom last admission  Hyponatremia, --sodiumis low continue to adjust dialysis.  Hyperlipidemia: --Continue statins.  Acute urinary retention- s/p foley placement 12/16. Foley removed 12/23 and he had been voiding. Unfortunately he developed acute urinary retention again on 12/25 and foley was replaced with >900 mL urine relieved. Will leave foley in place for now until he can follow up outpatient with urology.  Goals of Care -long discussion with patient's daughter at bedside 11/29/20 Advance care planning, including the explanation and discussion of advance directives was carried out with the patient and family.  Code status including explanations of "Full Code" and "DNR" and alternatives were discussed in detail.  Discussion of end-of-life issues including but not limited palliative care, hospice care and the concept of hospice, other end-of-life care options, power of attorney for health care decisions, living wills, and physician orders for life-sustaining treatment were also discussed with the patient and family.  Total face to face time 36 minutes. -patient wants to continue full scope of care -patient wants to wait a little longer before considering possible transfer to tertiary care center    Status is: Inpatient  Remains inpatient appropriate because:IV treatments appropriate due to intensity of illness or inability to take PO Remains orthostatic putting him at high risk for re-admission Also unable to remove fluid  on HD   Dispo: Patient From: [facility] Planned Disposition: [facility] Expected discharge date: 12/04/2020 Medically stable for discharge: No         Family Communication: daughter updated 11/29/20  Consultants: renal, GI, palliative  Code Status: FULL /  DVT Prophylaxis: SCDs   Procedures: As Listed in Progress Note Above  Antibiotics: None  Total time spent 35 minutes.  Greater than 50% spent face to face counseling and coordinating care.     Subjective: Patient denies fevers, chills, headache, chest pain, dyspnea, nausea, vomiting, diarrhea, abdominal pain, dysuria, hematuria, hematochezia, and melena.   Objective: Vitals:   11/29/20 0500 11/29/20 1419 11/29/20 2014 11/30/20 0502  BP:  136/71 138/74 136/68  Pulse:  64 62 (!) 56  Resp:  18 18 18   Temp:  98.1 F (36.7 C) (!) 97.3 F (36.3 C) 97.6 F (36.4 C)  TempSrc:  Oral    SpO2:  100% 99% 100%  Weight: 67.7 kg   65 kg  Height:        Intake/Output Summary (Last 24 hours) at 11/30/2020 1056 Last data filed at 11/30/2020 0502 Gross per 24 hour  Intake 240 ml  Output 550 ml  Net -310 ml   Weight change: -2.7 kg Exam:   General:  Pt is alert, follows commands appropriately, not in acute distress  HEENT: No icterus, No thrush, No neck mass, [place]/AT  Cardiovascular: RRR, S1/S2, no rubs, no gallops  Respiratory: CTA bilaterally, no wheezing, no crackles, no rhonchi  Abdomen: Soft/+BS, non tender, non distended, no guarding  Extremities: No edema, No lymphangitis, No petechiae, No rashes, no synovitis   Data Reviewed: I have personally reviewed following labs and imaging studies Basic Metabolic Panel: Recent Labs  Lab 11/26/20 0539 11/27/20 0514 11/28/20 0459 11/29/20 0526 11/30/20 0621  NA 128* 129* 130* 131* 128*  K 3.8 3.7 2.9* 3.5 3.4*  CL 98  98 94* 95* 94*  CO2 17* 18* 24 25 22    GLUCOSE 116* 87 98 136* 97  BUN 68* 75* 31* 44* 54*  CREATININE 5.73* 6.52* 3.51* 5.04* 6.03*  CALCIUM 8.0* 8.0* 7.5* 7.9* 8.0*  PHOS 3.8 4.6 2.4* 3.3 3.6   Liver Function Tests: Recent Labs  Lab 11/26/20 0539 11/27/20 0514 11/28/20 0459 11/29/20 0526 11/30/20 0621  ALBUMIN 2.9* 2.5* 2.3* 2.2* 2.2*   No results for input(s): LIPASE, AMYLASE in the last 168 hours. No results for input(s): AMMONIA in the last 168 hours. Coagulation Profile: No results for input(s): INR, PROTIME in the last 168 hours. CBC: Recent Labs  Lab 11/26/20 0539 11/27/20 1400 11/28/20 0459  WBC 15.4* 14.3* 14.0*  NEUTROABS  --   --  11.8*  HGB 7.9* 7.5* 7.9*  HCT 26.0* 23.6* 24.6*  MCV 94.5 92.9 90.8  PLT 211 165 145*   Cardiac Enzymes: No results for input(s): CKTOTAL, CKMB, CKMBINDEX, TROPONINI in the last 168 hours. BNP: Invalid input(s): POCBNP CBG: No results for input(s): GLUCAP in the last 168 hours. HbA1C: No results for input(s): HGBA1C in the last 72 hours. Urine analysis:    Component Value Date/Time   COLORURINE YELLOW 10/12/2020 1622   APPEARANCEUR CLEAR 10/12/2020 1622   LABSPEC 1.008 10/12/2020 1622   PHURINE 8.0 10/12/2020 1622   GLUCOSEU >=500 (A) 10/12/2020 1622   HGBUR NEGATIVE 10/12/2020 1622   BILIRUBINUR NEGATIVE 10/12/2020 1622   KETONESUR NEGATIVE 10/12/2020 1622   PROTEINUR 100 (A) 10/12/2020 1622   NITRITE NEGATIVE 10/12/2020 1622   LEUKOCYTESUR NEGATIVE 10/12/2020 1622   Sepsis Labs: @LABRCNTIP (procalcitonin:4,lacticidven:4) ) Recent Results (from the past 240 hour(s))  Gastrointestinal Panel by PCR , Stool     Status: None   Collection Time: 11/21/20  3:46 PM   Specimen: Stool  Result Value Ref Range Status   Campylobacter species NOT DETECTED NOT DETECTED Final   Plesimonas shigelloides NOT DETECTED NOT DETECTED Final   Salmonella species NOT DETECTED NOT DETECTED Final   Yersinia enterocolitica NOT DETECTED NOT DETECTED Final   Vibrio species NOT  DETECTED NOT DETECTED Final   Vibrio cholerae NOT DETECTED NOT DETECTED Final   Enteroaggregative E coli (EAEC) NOT DETECTED NOT DETECTED Final   Enteropathogenic E coli (EPEC) NOT DETECTED NOT DETECTED Final   Enterotoxigenic E coli (ETEC) NOT DETECTED NOT DETECTED Final   Shiga like toxin producing E coli (STEC) NOT DETECTED NOT DETECTED Final   Shigella/Enteroinvasive E coli (EIEC) NOT DETECTED NOT DETECTED Final   Cryptosporidium NOT DETECTED NOT DETECTED Final   Cyclospora cayetanensis NOT DETECTED NOT DETECTED Final   Entamoeba histolytica NOT DETECTED NOT DETECTED Final   Giardia lamblia NOT DETECTED NOT DETECTED Final   Adenovirus F40/41 NOT DETECTED NOT DETECTED Final   Astrovirus NOT DETECTED NOT DETECTED Final   Norovirus GI/GII NOT DETECTED NOT DETECTED Final   Rotavirus A NOT DETECTED NOT DETECTED Final   Sapovirus (I, II, IV, and V) NOT DETECTED NOT DETECTED Final    Comment: Performed at [facility], [address] [place], [place] [address]  SARS Coronavirus 2 by RT PCR (hospital order, performed in [place] hospital lab) Nasopharyngeal Nasopharyngeal Swab     Status: None   Collection Time: 11/22/20 10:35 AM   Specimen: Nasopharyngeal Swab  Result Value Ref Range Status   SARS Coronavirus 2 NEGATIVE NEGATIVE Final    Comment: (NOTE) SARS-CoV-2 target nucleic acids are NOT DETECTED.  The SARS-CoV-2 RNA is generally detectable in upper  and lower respiratory specimens during the acute phase of infection. The lowest concentration of SARS-CoV-2 viral copies this assay can detect is 250 copies / mL. A negative result does not preclude SARS-CoV-2 infection and should not be used as the sole basis for treatment or other patient management decisions.  A negative result may occur with improper specimen collection / handling, submission of specimen other than nasopharyngeal swab, presence of viral mutation(s) within the areas targeted by this assay, and  inadequate number of viral copies (<250 copies / mL). A negative result must be combined with clinical observations, patient history, and epidemiological information.  Fact Sheet for Patients:   StrictlyIdeas.no  Fact Sheet for Healthcare Providers: BankingDealers.co.za  This test is not yet approved or  cleared by the [place] FDA and has been authorized for detection and/or diagnosis of SARS-CoV-2 by FDA under an Emergency Use Authorization (EUA).  This EUA will remain in effect (meaning this test can be used) for the duration of the COVID-19 declaration under Section 564(b)(1) of the Act, 21 U.S.C. section 360bbb-3(b)(1), unless the authorization is terminated or revoked sooner.  Performed at [facility], [address], [place], [place] [address]      Scheduled Meds: . amiodarone  200 mg Oral Daily  . bismuth subsalicylate  30 mL Oral Q6H  . calcitRIOL  0.5 mcg Oral Daily  . Chlorhexidine Gluconate Cloth  6 each Topical Q0600  . Chlorhexidine Gluconate Cloth  6 each Topical Q0600  . darbepoetin (ARANESP) injection - DIALYSIS  200 mcg Intravenous Q Mon-HD  . famotidine  20 mg Oral Daily  . feeding supplement  237 mL Oral BID BM  . finasteride  5 mg Oral Daily  . fludrocortisone  0.2 mg Oral Daily  . loperamide  2 mg Oral TID  . metoprolol tartrate  12.5 mg Oral QHS  . midodrine  10 mg Oral TID with meals  . multivitamin  1 tablet Oral QHS  . psyllium  1 packet Oral QHS  . pyridostigmine  60 mg Oral Daily  . simvastatin  20 mg Oral QPM  . sodium chloride flush  3 mL Intravenous Q12H   Continuous Infusions: . sodium chloride    . sodium chloride    . sodium chloride      Procedures/Studies: MR BRAIN WO CONTRAST  Result Date: 11/16/2020 CLINICAL DATA:  Question deposition disease. Hypotension. Altered mental status. EXAM: MRI HEAD WITHOUT CONTRAST TECHNIQUE: Multiplanar, multiecho pulse sequences of the brain and  surrounding structures were obtained without intravenous contrast. COMPARISON:  Head CT 01/25/2020 FINDINGS: Brain: Diffusion imaging does not show any acute or subacute infarction. No focal abnormality affects brainstem or cerebellum. Cerebral hemispheres show mild age related volume loss without evidence of small-vessel disease or large vessel infarction. No mass lesion, hemorrhage, hydrocephalus or extra-axial collection. Vascular: Major vessels at the base of the brain show flow. Skull and upper cervical spine: Negative Sinuses/Orbits: Clear/normal Other: None IMPRESSION: No acute or reversible finding. Mild age related volume loss. No evidence of small-vessel disease or large vessel infarction. Electronically Signed   By: Nelson Chimes M.D.   On: 11/16/2020 14:01   DG Abd Portable 1V  Result Date: 11/28/2020 CLINICAL DATA:  Abdominal distension abdominal pain EXAM: PORTABLE ABDOMEN - 1 VIEW COMPARISON:  November 07, 2020 CT assessment, abdominal plain film from November 15, 2020 FINDINGS: Decreased distension of bowel loops in the abdomen when compared to the study from November 15, 2020. Mild distension currently of scattered gas-filled  loops of small bowel. Suggestion of small amount of proximal rectal gas. No acute skeletal process on limited assessment. Signs of basilar atelectasis. IMPRESSION: Decreased distension of bowel loops in the abdomen when compared to the study from November 15, 2020. Mild distension currently of scattered gas-filled loops of small bowel may reflect mild ileus. Suggestion of small amount of proximal rectal gas. Electronically Signed   By: Zetta Bills M.D.   On: 11/28/2020 16:15   DG Abd Portable 1V  Result Date: 11/15/2020 CLINICAL DATA:  Abdominal distension EXAM: PORTABLE ABDOMEN - 1 VIEW COMPARISON:  10/25/2020 FINDINGS: The subdiaphragmatic region and right flank are excluded from view. Multiple gas-filled prominent loops of large and small bowel are seen throughout  the visualized abdomen in keeping with un underlying ileus. No gross free intraperitoneal gas. Gas and stool is seen within the rectal vault. No organomegaly. No acute bone abnormality. IMPRESSION: Mild ileus. Electronically Signed   By: Fidela Salisbury MD   On: 11/15/2020 04:15   CT Angio Abd/Pel w/ and/or w/o  Result Date: 11/07/2020 CLINICAL DATA:  Evaluate for mesenteric ischemia. End-stage renal disease on dialysis. Bowel wall thickening on exam from 10/12/2020. EXAM: CTA ABDOMEN AND PELVIS WITHOUT AND WITH CONTRAST TECHNIQUE: Multidetector CT imaging of the abdomen and pelvis was performed using the standard protocol during bolus administration of intravenous contrast. Multiplanar reconstructed images and MIPs were obtained and reviewed to evaluate the vascular anatomy. CONTRAST:  65mL OMNIPAQUE IOHEXOL 350 MG/ML SOLN COMPARISON:  10/22/2020 FINDINGS: VASCULAR Aorta: Mild atherosclerotic disease in the abdominal aorta without aneurysm or dissection. Celiac: Celiac trunk is patent without significant stenosis. Focal dilatation of the trunk measures up to 9 mm. Main branch vessels are patent. SMA: Replaced right hepatic artery. SMA is widely patent without significant stenosis. No evidence for aneurysm or dissection. Renals: Main right renal artery is widely patent without stenosis, aneurysm or dissection. There is small accessory right renal artery. High-grade focal narrowing in the proximal main left renal artery without significant plaque in this area. Findings could be related to a dissection but indeterminate. Small accessory left renal artery is patent. IMA: Patent Inflow: Atherosclerotic disease involving the proximal left common iliac artery without significant stenosis. Common iliac arteries are patent bilaterally. Disease and stenosis involving the right internal iliac artery. Left internal iliac artery is patent. Bilateral external iliac arteries are widely patent. Proximal Outflow: Proximal  femoral arteries are patent bilaterally. Veins: Main portal venous system is patent. Limited evaluation of the superior cavoatrial junction on the arterial phase imaging due to non-opacified blood in this area. IVC and renal veins are patent. Narrowing of the left common iliac vein from the right common iliac artery is a normal anatomic variant. Review of the MIP images confirms the above findings. NON-VASCULAR Lower chest: Small bilateral pleural effusions with compressive atelectasis. Hepatobiliary: High-density material in the gallbladder are suggestive for small stones. The gallbladder is moderately distended without definite inflammatory changes. There is colon anterior to the liver. No discrete liver lesion. No biliary dilatation. Gallbladder distension has minimally changed since the previous examination. Pancreas: Unremarkable. No pancreatic ductal dilatation or surrounding inflammatory changes. Spleen: Normal in size without focal abnormality. Adrenals/Urinary Tract: Normal appearance of the adrenal glands. Both kidneys are small with scattered calcifications. Findings are suggestive for nonobstructive renal calculi. Stomach/Bowel: Normal appearance of the stomach and duodenum. There is a rectal catheter with an inflated balloon. Sigmoid colon is moderately distended with mild wall thickening. However, the sigmoid wall thickening is less  impressive on the venous phase imaging. Cecum is distended and contains a large amount of stool. Transverse colon is distended with gas. Lymphatic: No significant lymph node enlargement in the abdomen or pelvis. Reproductive: Stable appearance of the prostate. Other: Diffuse subcutaneous edema. Small amount of pelvic ascites which is new. Trace ascites in the left lower quadrant of the abdomen. Trace ascites in the upper abdomen. Negative for free air. Musculoskeletal: Chronic disc space narrowing with endplate changes at R8-V8. Stable disc space narrowing at L5-S1.  IMPRESSION: VASCULAR 1. Atherosclerotic disease in the abdominal aorta without aneurysm or significant stenosis. Aortic Atherosclerosis (ICD10-I70.0). 2. Main mesenteric arteries are patent without significant stenosis. No evidence to suggest chronic mesenteric ischemia. 3. Multiple renal arteries as described. Focal narrowing the proximal left main renal artery that could be related to atherosclerotic disease or focal dissection in this area. NON-VASCULAR 1. The sigmoid colon has been partially decompressed since 10/22/2020 and placement of the rectal tube. There continues to be gaseous distension of the sigmoid colon and transverse colon. Mild wall thickening in the sigmoid colon is nonspecific but may be related to the partial decompression rather than infectious or inflammatory process. Findings are suggestive for an underlying colonic ileus. 2. Bilateral kidneys are atrophic with bilateral calcifications. Findings are compatible with history of end-stage renal disease. Calcifications could represent nonobstructive renal calculi. 3. Diffuse subcutaneous edema with small amount of ascites, most prominent in the pelvis. 4. Significant disc space disease at L2-L3. 5. Bilateral pleural effusions with compressive atelectasis at the lung bases. 6. Cholelithiasis with moderate gallbladder distension. Electronically Signed   By: Markus Daft M.D.   On: 11/07/2020 17:58    Orson Eva, DO  [facility]  If 7PM-7AM, please contact night-coverage [email or web address] Password [email or web address] 11/30/2020, 10:56 AM   LOS: 28 days

## 2020-11-30 NOTE — TOC Progression Note (Signed)
Transition of Care [facility]) - Progression Note    Patient Details  Name: [name] MRN: [id number] Date of Birth: 02/27/1951  Transition of Care [facility]) CM/SW Contact  Natasha Bence, LCSW Phone Number: 11/30/2020, 4:49 PM  Clinical Narrative:    Patient agreeable to SNF, but believes that they will not be able to participate in stretcher dialysis due to previous attempts with stretcher dialysis. CSW completed FL2 and referred patient to SNF. CSW contacted [facility] to refer patient for stretcher dialysis. [facility] rep reported that Mariel Craft is the admissions rep but will not be in to take new admits until Monday or Tuesday. [facility] and [facility] not able to provide stretcher dialysis. TOC to follow.  [place]  Expected Discharge Plan: Skilled Nursing Facility Barriers to Discharge: Continued Medical Work up  Expected Discharge Plan and Services Expected Discharge Plan: [place]                                               Social Determinants of Health (SDOH) Interventions    Readmission Risk Interventions No flowsheet data found.

## 2020-12-01 DIAGNOSIS — N186 End stage renal disease: Secondary | ICD-10-CM | POA: Diagnosis not present

## 2020-12-01 DIAGNOSIS — Z992 Dependence on renal dialysis: Secondary | ICD-10-CM | POA: Diagnosis not present

## 2020-12-01 DIAGNOSIS — Z7189 Other specified counseling: Secondary | ICD-10-CM | POA: Diagnosis not present

## 2020-12-01 DIAGNOSIS — I951 Orthostatic hypotension: Secondary | ICD-10-CM | POA: Diagnosis not present

## 2020-12-01 LAB — RENAL FUNCTION PANEL
Albumin: 2 g/dL — ABNORMAL LOW (ref 3.5–5.0)
Anion gap: 9 (ref 5–15)
BUN: 35 mg/dL — ABNORMAL HIGH (ref 8–23)
CO2: 25 mmol/L (ref 22–32)
Calcium: 7.7 mg/dL — ABNORMAL LOW (ref 8.9–10.3)
Chloride: 97 mmol/L — ABNORMAL LOW (ref 98–111)
Creatinine, Ser: 4.51 mg/dL — ABNORMAL HIGH (ref 0.61–1.24)
GFR, Estimated: 13 mL/min — ABNORMAL LOW (ref >60)
Glucose, Bld: 88 mg/dL (ref 70–99)
Phosphorus: 3.1 mg/dL (ref 2.5–4.6)
Potassium: 3 mmol/L — ABNORMAL LOW (ref 3.5–5.1)
Sodium: 131 mmol/L — ABNORMAL LOW (ref 135–145)

## 2020-12-01 LAB — URINALYSIS, COMPLETE (UACMP) WITH MICROSCOPIC
Bilirubin Urine: NEGATIVE
Glucose, UA: NEGATIVE mg/dL
Ketones, ur: NEGATIVE mg/dL
Nitrite: NEGATIVE
Protein, ur: 100 mg/dL — AB
RBC / HPF: >50 RBC/hpf — ABNORMAL HIGH (ref 0–5)
Specific Gravity, Urine: 1.008 (ref 1.005–1.030)
WBC, UA: >50 WBC/hpf — ABNORMAL HIGH (ref 0–5)
pH: 6 (ref 5.0–8.0)

## 2020-12-01 NOTE — Progress Notes (Signed)
PROGRESS NOTE  [name] QMG:[id number] DOB: Jan 20, 1951 DOA: 11/02/2020 PCP: Patient, No Pcp Per  Brief History: 70yo male with history of diabetes mellitus, CKD stage V started recently on dialysis Monday Wednesday Friday and still making urine, recent A. fib and was restarted on amiodarone metoprolol and Eliquis was [facility]after he was noted to have an episode of syncope while getting up to go to hemodialysis.On arrival to the ED his systolic blood pressures were in the 70 mmHg range. He is noted to have some diarrhea, but he has recently completed course of ciprofloxacin and Flagyl by 11/23. He was noted to have nonspecific colitis on pathology after recent colonoscopy on 11/17 and was started on prednisone taper which he continues to take at this time.He was just discharged after admission from 10/12/20 to 11/01/20 when he was treated for syncope from orthostasis as well as colitis as discussed above. He also required 5 units PRBC last admission. Hewas started on Solu-Medrol 60 mg daily 11/19--per GI>>po 40 mg daily--wean by 10 mg per week--started30 mg daily on 11/02/20. He is now finished with steroids   ED Course:Patient had received 250 mL of normal saline fluid bolus with improvement in blood pressure readings. Manual blood pressures confirm systolics in the low 326 range and heart rates fluctuate in the low 100 range. He denies any palpitations or chest pains or shortness of breath. Laboratory data with stable hemoglobin levels noted and he is noted to be hypokalemic with potassium 2.9 and he has a lactic acid level 2.6. Covid testing ordered and pending.  Assessment/Plan: Orthostatic hypotension -continues to be orthostatic despite pyridostigmine, midodrine and florinef -this lead to repeat admission after d/c on 11/01/20 -10/13/20--EchoEF 71-24%PYKDX 1 diastolic dysfunction -added abdominal binder and knee stockings -decrease metoprolol to once  daily -am cortisol 23.1 -TSH 1.912 -not a candidate for Northera -case discussed with renal, Dr. Libby Maw d/c with stretcher HD until able to tolerate sitting and standing -11/30/20--discussed case with Loleta Books, Dr. Ace Gins additional to offer, agrees with our current treatment -OOB with each shift  Chronic Diarrhea/Colitis -presented with diarrhea and LGIB last admission in Nov 2021 -improving with imodium -initially treated with cip/fl and then 4 weeks of steroid taper -10/15/20, 11/06/20 and 11/21/20 GI pathogen panel neg -11/02/20 Cdiff neg -11/05/20 Stool O&P--neg -GI following -10/17/20 colonoscopy-area of moderately congested mucosa with ulceration was found in the sigmoid colon and in the descending colon.Bllood noted in rectum, sigmoid and descending colon -11/21/20 colonoscopy--polyps in asc colon & 2 polyps hepatic flexure, extensive circumferential ulcers in distal sigmoid -10/17/20 Biopsies of rectum and sigmoid colon show active nonspecific colitis w/ ulceration c/f medication induced (NSAIDs), ischemia, or stercoral proctitis -12//22/21 TI and colon biopsies--neg for inflammation; +tubular adenmoa -GI suspects ischemic injury -Fecal elastase and qualitative fecal fat analysisordered by GI -11/30/20--discussed case with Loleta Books, Dr. Ace Gins additional to offer, agrees with our current treatment   Anemia of CKD -had rectal bleed last admit requiring 5 units PRBC -11/21/20--received 1 unit this admit -multifactorial including CKD, blood draws, chronic disease -iron studies consistent with ACD  ESRD -Biopsy results from outpatient nephrologist moderate to severe arterial nephrosclerosis with 75% global glomerulosclerosis, focal and segmental glomerular atrophic scarring and diffuse moderate to severe tubulointerstitial scarring --started recently on dialysis MWF --HD on 12/28--unable to remove fluid --having intradialytic hypotension already  on midodrine-->having difficulty removing fluid  Recent paroxysmal A. fib --started on amiodaronelast admission --Eliquis--holdingdue to his rectal  bleeding and acute blood loss anemia --Patient understands risks and benefits of holding anticoagulation and he seems to be reluctant to start it again-we will need close follow-up with his cardiologist/PCP. --metoprolol stopped due to orthostasis initially --start metoprolol 12.5 mg bid as pt having more afib RVR episodesfrom last admission>>decrease to once daily  Hyponatremia, --sodiumis low continue to adjust dialysis.  Hyperlipidemia: --Continue statins.  Acute urinary retention- s/p foley placement 12/16. Foley removed 12/23 and he had been voiding. Unfortunately he developed acute urinary retention again on 12/25 and foley was replaced with >900 mL urine relieved. Will leave foley in place for now until he can follow up outpatient with urology.  Goals of Care -long discussion with patient's daughter at bedside 11/29/20 Advance care planning, including the explanation and discussion of advance directives was carried out with the patient and family. Code status including explanations of "Full Code" and "DNR" and alternatives were discussed in detail. Discussion of end-of-life issues including but not limited palliative care, hospice care and the concept of hospice, other end-of-life care options, power of attorney for health care decisions, living wills, and physician orders for life-sustaining treatment were also discussed with the patient and family. Total face to face time 36 minutes. -patient wants to continue full scope of care -12/02/19--confirms full scope of care;  Plan to d/c to SNF and stretcher HD facility    Status is: Inpatient  Remains inpatient appropriate because:IV treatments appropriate due to intensity of illness or inability to take PO Remains orthostatic putting him at high risk for  re-admission Also unable to remove fluid on HD   Dispo: Patient From: [facility] Planned Disposition: [facility] Expected discharge date: 12/04/2020 Medically stable for discharge: No         Family Communication: daughter updated 12/01/20  Consultants: renal, GI, palliative  Code Status: FULL   DVT Prophylaxis: SCDs   Procedures: As Listed in Progress Note Above  Antibiotics: None  Total time spent 35 minutes.  Greater than 50% spent face to face counseling and coordinating care.      Subjective: Patient had 2 BMs in last 24 hours.  Patient denies fevers, chills, headache, chest pain, dyspnea, nausea, vomiting, diarrhea, abdominal pain, dysuria, hematuria, hematochezia, and melena.   Objective: Vitals:   11/30/20 1730 11/30/20 2024 12/01/20 0443 12/01/20 1447  BP: 138/71 (!) 153/66  133/81  Pulse: 70 71  (!) 51  Resp: 16 16  16   Temp: 97.9 F (36.6 C) 98.6 F (37 C)  98.2 F (36.8 C)  TempSrc: Oral Oral  Oral  SpO2:  99%  98%  Weight:   65.1 kg   Height:        Intake/Output Summary (Last 24 hours) at 12/01/2020 1541 Last data filed at 11/30/2020 2000 Gross per 24 hour  Intake 240 ml  Output 0 ml  Net 240 ml   Weight change: 0.1 kg Exam:   General:  Pt is alert, follows commands appropriately, not in acute distress  HEENT: No icterus, No thrush, No neck mass, [place]/AT  Cardiovascular: RRR, S1/S2, no rubs, no gallops  Respiratory: CTA bilaterally, no wheezing, no crackles, no rhonchi  Abdomen: Soft/+BS, non tender, non distended, no guarding  Extremities: No edema, No lymphangitis, No petechiae, No rashes, no synovitis   Data Reviewed: I have personally reviewed following labs and imaging studies Basic Metabolic Panel: Recent Labs  Lab 11/27/20 0514 11/28/20 0459 11/29/20 0526 11/30/20 0621 12/01/20 0658  NA 129* 130* 131* 128*  131*  K  3.7 2.9* 3.5 3.4* 3.0*  CL 98 94* 95* 94* 97*  CO2 18* 24 25 22 25   GLUCOSE 87 98 136* 97 88  BUN 75* 31* 44* 54* 35*  CREATININE 6.52* 3.51* 5.04* 6.03* 4.51*  CALCIUM 8.0* 7.5* 7.9* 8.0* 7.7*  PHOS 4.6 2.4* 3.3 3.6 3.1   Liver Function Tests: Recent Labs  Lab 11/27/20 0514 11/28/20 0459 11/29/20 0526 11/30/20 0621 12/01/20 0658  ALBUMIN 2.5* 2.3* 2.2* 2.2* 2.0*   No results for input(s): LIPASE, AMYLASE in the last 168 hours. No results for input(s): AMMONIA in the last 168 hours. Coagulation Profile: No results for input(s): INR, PROTIME in the last 168 hours. CBC: Recent Labs  Lab 11/26/20 0539 11/27/20 1400 11/28/20 0459  WBC 15.4* 14.3* 14.0*  NEUTROABS  --   --  11.8*  HGB 7.9* 7.5* 7.9*  HCT 26.0* 23.6* 24.6*  MCV 94.5 92.9 90.8  PLT 211 165 145*   Cardiac Enzymes: No results for input(s): CKTOTAL, CKMB, CKMBINDEX, TROPONINI in the last 168 hours. BNP: Invalid input(s): POCBNP CBG: No results for input(s): GLUCAP in the last 168 hours. HbA1C: No results for input(s): HGBA1C in the last 72 hours. Urine analysis:    Component Value Date/Time   COLORURINE YELLOW 10/12/2020 1622   APPEARANCEUR CLEAR 10/12/2020 1622   LABSPEC 1.008 10/12/2020 1622   PHURINE 8.0 10/12/2020 1622   GLUCOSEU >=500 (A) 10/12/2020 1622   HGBUR NEGATIVE 10/12/2020 1622   BILIRUBINUR NEGATIVE 10/12/2020 1622   KETONESUR NEGATIVE 10/12/2020 1622   PROTEINUR 100 (A) 10/12/2020 1622   NITRITE NEGATIVE 10/12/2020 1622   LEUKOCYTESUR NEGATIVE 10/12/2020 1622   Sepsis Labs: @LABRCNTIP (procalcitonin:4,lacticidven:4) ) Recent Results (from the past 240 hour(s))  Gastrointestinal Panel by PCR , Stool     Status: None   Collection Time: 11/21/20  3:46 PM   Specimen: Stool  Result Value Ref Range Status   Campylobacter species NOT DETECTED NOT DETECTED Final   Plesimonas shigelloides NOT DETECTED NOT DETECTED Final   Salmonella species NOT DETECTED NOT DETECTED Final   Yersinia  enterocolitica NOT DETECTED NOT DETECTED Final   Vibrio species NOT DETECTED NOT DETECTED Final   Vibrio cholerae NOT DETECTED NOT DETECTED Final   Enteroaggregative E coli (EAEC) NOT DETECTED NOT DETECTED Final   Enteropathogenic E coli (EPEC) NOT DETECTED NOT DETECTED Final   Enterotoxigenic E coli (ETEC) NOT DETECTED NOT DETECTED Final   Shiga like toxin producing E coli (STEC) NOT DETECTED NOT DETECTED Final   Shigella/Enteroinvasive E coli (EIEC) NOT DETECTED NOT DETECTED Final   Cryptosporidium NOT DETECTED NOT DETECTED Final   Cyclospora cayetanensis NOT DETECTED NOT DETECTED Final   Entamoeba histolytica NOT DETECTED NOT DETECTED Final   Giardia lamblia NOT DETECTED NOT DETECTED Final   Adenovirus F40/41 NOT DETECTED NOT DETECTED Final   Astrovirus NOT DETECTED NOT DETECTED Final   Norovirus GI/GII NOT DETECTED NOT DETECTED Final   Rotavirus A NOT DETECTED NOT DETECTED Final   Sapovirus (I, II, IV, and V) NOT DETECTED NOT DETECTED Final    Comment: Performed at [facility], [address] [place], [place] [address]  SARS Coronavirus 2 by RT PCR (hospital order, performed in [facility] hospital lab) Nasopharyngeal Nasopharyngeal Swab     Status: None   Collection Time: 11/22/20 10:35 AM   Specimen: Nasopharyngeal Swab  Result Value Ref Range Status   SARS Coronavirus 2 NEGATIVE NEGATIVE Final    Comment: (NOTE) SARS-CoV-2 target nucleic acids are  NOT DETECTED.  The SARS-CoV-2 RNA is generally detectable in upper and lower respiratory specimens during the acute phase of infection. The lowest concentration of SARS-CoV-2 viral copies this assay can detect is 250 copies / mL. A negative result does not preclude SARS-CoV-2 infection and should not be used as the sole basis for treatment or other patient management decisions.  A negative result may occur with improper specimen collection / handling, submission of specimen other than nasopharyngeal swab, presence of  viral mutation(s) within the areas targeted by this assay, and inadequate number of viral copies (<250 copies / mL). A negative result must be combined with clinical observations, patient history, and epidemiological information.  Fact Sheet for Patients:   StrictlyIdeas.no  Fact Sheet for Healthcare Providers: BankingDealers.co.za  This test is not yet approved or  cleared by the [place] FDA and has been authorized for detection and/or diagnosis of SARS-CoV-2 by FDA under an Emergency Use Authorization (EUA).  This EUA will remain in effect (meaning this test can be used) for the duration of the COVID-19 declaration under Section 564(b)(1) of the Act, 21 U.S.C. section 360bbb-3(b)(1), unless the authorization is terminated or revoked sooner.  Performed at [facility] [address], [place], [place] [address]      Scheduled Meds: . amiodarone  200 mg Oral Daily  . bismuth subsalicylate  30 mL Oral Q6H  . calcitRIOL  0.5 mcg Oral Daily  . Chlorhexidine Gluconate Cloth  6 each Topical Q0600  . Chlorhexidine Gluconate Cloth  6 each Topical Q0600  . darbepoetin (ARANESP) injection - DIALYSIS  200 mcg Intravenous Q Mon-HD  . famotidine  20 mg Oral Daily  . feeding supplement  237 mL Oral BID BM  . finasteride  5 mg Oral Daily  . fludrocortisone  0.2 mg Oral Daily  . loperamide  2 mg Oral TID  . metoprolol tartrate  12.5 mg Oral QHS  . midodrine  10 mg Oral TID with meals  . multivitamin  1 tablet Oral QHS  . psyllium  1 packet Oral QHS  . pyridostigmine  60 mg Oral Daily  . simvastatin  20 mg Oral QPM  . sodium chloride flush  3 mL Intravenous Q12H   Continuous Infusions: . sodium chloride    . sodium chloride    . sodium chloride      Procedures/Studies: MR BRAIN WO CONTRAST  Result Date: 11/16/2020 CLINICAL DATA:  Question deposition disease. Hypotension. Altered mental status. EXAM: MRI HEAD WITHOUT CONTRAST  TECHNIQUE: Multiplanar, multiecho pulse sequences of the brain and surrounding structures were obtained without intravenous contrast. COMPARISON:  Head CT 01/25/2020 FINDINGS: Brain: Diffusion imaging does not show any acute or subacute infarction. No focal abnormality affects brainstem or cerebellum. Cerebral hemispheres show mild age related volume loss without evidence of small-vessel disease or large vessel infarction. No mass lesion, hemorrhage, hydrocephalus or extra-axial collection. Vascular: Major vessels at the base of the brain show flow. Skull and upper cervical spine: Negative Sinuses/Orbits: Clear/normal Other: None IMPRESSION: No acute or reversible finding. Mild age related volume loss. No evidence of small-vessel disease or large vessel infarction. Electronically Signed   By: Nelson Chimes M.D.   On: 11/16/2020 14:01   DG Abd Portable 1V  Result Date: 11/28/2020 CLINICAL DATA:  Abdominal distension abdominal pain EXAM: PORTABLE ABDOMEN - 1 VIEW COMPARISON:  November 07, 2020 CT assessment, abdominal plain film from November 15, 2020 FINDINGS: Decreased distension of bowel loops in the abdomen when compared to the  study from November 15, 2020. Mild distension currently of scattered gas-filled loops of small bowel. Suggestion of small amount of proximal rectal gas. No acute skeletal process on limited assessment. Signs of basilar atelectasis. IMPRESSION: Decreased distension of bowel loops in the abdomen when compared to the study from November 15, 2020. Mild distension currently of scattered gas-filled loops of small bowel may reflect mild ileus. Suggestion of small amount of proximal rectal gas. Electronically Signed   By: Zetta Bills M.D.   On: 11/28/2020 16:15   DG Abd Portable 1V  Result Date: 11/15/2020 CLINICAL DATA:  Abdominal distension EXAM: PORTABLE ABDOMEN - 1 VIEW COMPARISON:  10/25/2020 FINDINGS: The subdiaphragmatic region and right flank are excluded from view. Multiple  gas-filled prominent loops of large and small bowel are seen throughout the visualized abdomen in keeping with un underlying ileus. No gross free intraperitoneal gas. Gas and stool is seen within the rectal vault. No organomegaly. No acute bone abnormality. IMPRESSION: Mild ileus. Electronically Signed   By: Fidela Salisbury MD   On: 11/15/2020 04:15   CT Angio Abd/Pel w/ and/or w/o  Result Date: 11/07/2020 CLINICAL DATA:  Evaluate for mesenteric ischemia. End-stage renal disease on dialysis. Bowel wall thickening on exam from 10/12/2020. EXAM: CTA ABDOMEN AND PELVIS WITHOUT AND WITH CONTRAST TECHNIQUE: Multidetector CT imaging of the abdomen and pelvis was performed using the standard protocol during bolus administration of intravenous contrast. Multiplanar reconstructed images and MIPs were obtained and reviewed to evaluate the vascular anatomy. CONTRAST:  63mL OMNIPAQUE IOHEXOL 350 MG/ML SOLN COMPARISON:  10/22/2020 FINDINGS: VASCULAR Aorta: Mild atherosclerotic disease in the abdominal aorta without aneurysm or dissection. Celiac: Celiac trunk is patent without significant stenosis. Focal dilatation of the trunk measures up to 9 mm. Main branch vessels are patent. SMA: Replaced right hepatic artery. SMA is widely patent without significant stenosis. No evidence for aneurysm or dissection. Renals: Main right renal artery is widely patent without stenosis, aneurysm or dissection. There is small accessory right renal artery. High-grade focal narrowing in the proximal main left renal artery without significant plaque in this area. Findings could be related to a dissection but indeterminate. Small accessory left renal artery is patent. IMA: Patent Inflow: Atherosclerotic disease involving the proximal left common iliac artery without significant stenosis. Common iliac arteries are patent bilaterally. Disease and stenosis involving the right internal iliac artery. Left internal iliac artery is patent. Bilateral  external iliac arteries are widely patent. Proximal Outflow: Proximal femoral arteries are patent bilaterally. Veins: Main portal venous system is patent. Limited evaluation of the superior cavoatrial junction on the arterial phase imaging due to non-opacified blood in this area. IVC and renal veins are patent. Narrowing of the left common iliac vein from the right common iliac artery is a normal anatomic variant. Review of the MIP images confirms the above findings. NON-VASCULAR Lower chest: Small bilateral pleural effusions with compressive atelectasis. Hepatobiliary: High-density material in the gallbladder are suggestive for small stones. The gallbladder is moderately distended without definite inflammatory changes. There is colon anterior to the liver. No discrete liver lesion. No biliary dilatation. Gallbladder distension has minimally changed since the previous examination. Pancreas: Unremarkable. No pancreatic ductal dilatation or surrounding inflammatory changes. Spleen: Normal in size without focal abnormality. Adrenals/Urinary Tract: Normal appearance of the adrenal glands. Both kidneys are small with scattered calcifications. Findings are suggestive for nonobstructive renal calculi. Stomach/Bowel: Normal appearance of the stomach and duodenum. There is a rectal catheter with an inflated balloon. Sigmoid colon is moderately distended  with mild wall thickening. However, the sigmoid wall thickening is less impressive on the venous phase imaging. Cecum is distended and contains a large amount of stool. Transverse colon is distended with gas. Lymphatic: No significant lymph node enlargement in the abdomen or pelvis. Reproductive: Stable appearance of the prostate. Other: Diffuse subcutaneous edema. Small amount of pelvic ascites which is new. Trace ascites in the left lower quadrant of the abdomen. Trace ascites in the upper abdomen. Negative for free air. Musculoskeletal: Chronic disc space narrowing with  endplate changes at G9-J2. Stable disc space narrowing at L5-S1. IMPRESSION: VASCULAR 1. Atherosclerotic disease in the abdominal aorta without aneurysm or significant stenosis. Aortic Atherosclerosis (ICD10-I70.0). 2. Main mesenteric arteries are patent without significant stenosis. No evidence to suggest chronic mesenteric ischemia. 3. Multiple renal arteries as described. Focal narrowing the proximal left main renal artery that could be related to atherosclerotic disease or focal dissection in this area. NON-VASCULAR 1. The sigmoid colon has been partially decompressed since 10/22/2020 and placement of the rectal tube. There continues to be gaseous distension of the sigmoid colon and transverse colon. Mild wall thickening in the sigmoid colon is nonspecific but may be related to the partial decompression rather than infectious or inflammatory process. Findings are suggestive for an underlying colonic ileus. 2. Bilateral kidneys are atrophic with bilateral calcifications. Findings are compatible with history of end-stage renal disease. Calcifications could represent nonobstructive renal calculi. 3. Diffuse subcutaneous edema with small amount of ascites, most prominent in the pelvis. 4. Significant disc space disease at L2-L3. 5. Bilateral pleural effusions with compressive atelectasis at the lung bases. 6. Cholelithiasis with moderate gallbladder distension. Electronically Signed   By: Markus Daft M.D.   On: 11/07/2020 17:58    Orson Eva, DO  [facility]  If 7PM-7AM, please contact night-coverage [email or web address] Password [email or web address] 12/01/2020, 3:41 PM   LOS: 29 days

## 2020-12-01 NOTE — Progress Notes (Signed)
Has had two mushy stools today.  Unable to stand for orthostatic vitals longer than 10 seconds due to legs being weak.

## 2020-12-01 NOTE — Progress Notes (Signed)
Patient ID: [name], male   DOB: 05-08-1951, 70 y.o.   MRN: [id number] S:   Tol HD yesterday,needing tPA  No c/o today  [name] = nothing to add  Plan for SNF + Stretcher HD  O:BP (!) 153/66 (BP Location: Right Arm)   Pulse 71   Temp 98.6 F (37 C) (Oral)   Resp 16   Ht 6\' 2"  (1.88 m)   Wt 65.1 kg   SpO2 99%   BMI 18.43 kg/m   Intake/Output Summary (Last 24 hours) at 12/01/2020 1422 Last data filed at 11/30/2020 2000 Gross per 24 hour  Intake 240 ml  Output 0 ml  Net 240 ml   Intake/Output: I/O last 3 completed shifts: In: 240 [P.O.:240] Out: 250 [Urine:250]    Intake/Output this shift:  No intake/output data recorded. Weight change: 0.1 kg Gen: NAD CVS: RRR Resp: bilateral chest rise w/ no iwob Abd: +BS, soft, NT/ND Ext: trace pedal edema TDC R IJ  Recent Labs  Lab 11/25/20 0754 11/26/20 0539 11/27/20 0514 11/28/20 0459 11/29/20 0526 11/30/20 0621 12/01/20 0658  NA 128* 128* 129* 130* 131* 128* 131*  K 4.2 3.8 3.7 2.9* 3.5 3.4* 3.0*  CL 99 98 98 94* 95* 94* 97*  CO2 18* 17* 18* 24 25 22 25   GLUCOSE 125* 116* 87 98 136* 97 88  BUN 56* 68* 75* 31* 44* 54* 35*  CREATININE 4.90* 5.73* 6.52* 3.51* 5.04* 6.03* 4.51*  ALBUMIN 2.6* 2.9* 2.5* 2.3* 2.2* 2.2* 2.0*  CALCIUM 7.7* 8.0* 8.0* 7.5* 7.9* 8.0* 7.7*  PHOS 4.5 3.8 4.6 2.4* 3.3 3.6 3.1   Liver Function Tests: Recent Labs  Lab 11/29/20 0526 11/30/20 0621 12/01/20 0658  ALBUMIN 2.2* 2.2* 2.0*   No results for input(s): LIPASE, AMYLASE in the last 168 hours. No results for input(s): AMMONIA in the last 168 hours. CBC: Recent Labs  Lab 11/26/20 0539 11/27/20 1400 11/28/20 0459  WBC 15.4* 14.3* 14.0*  NEUTROABS  --   --  11.8*  HGB 7.9* 7.5* 7.9*  HCT 26.0* 23.6* 24.6*  MCV 94.5 92.9 90.8  PLT 211 165 145*   Cardiac Enzymes: No results for input(s): CKTOTAL, CKMB, CKMBINDEX, TROPONINI in the last 168 hours. CBG: No results for input(s): GLUCAP in the last 168 hours.  Iron Studies:  No  results for input(s): IRON, TIBC, TRANSFERRIN, FERRITIN in the last 72 hours. Studies/Results: No results found. Marland Kitchen amiodarone  200 mg Oral Daily  . bismuth subsalicylate  30 mL Oral Q6H  . calcitRIOL  0.5 mcg Oral Daily  . Chlorhexidine Gluconate Cloth  6 each Topical Q0600  . Chlorhexidine Gluconate Cloth  6 each Topical Q0600  . darbepoetin (ARANESP) injection - DIALYSIS  200 mcg Intravenous Q Mon-HD  . famotidine  20 mg Oral Daily  . feeding supplement  237 mL Oral BID BM  . finasteride  5 mg Oral Daily  . fludrocortisone  0.2 mg Oral Daily  . loperamide  2 mg Oral TID  . metoprolol tartrate  12.5 mg Oral QHS  . midodrine  10 mg Oral TID with meals  . multivitamin  1 tablet Oral QHS  . psyllium  1 packet Oral QHS  . pyridostigmine  60 mg Oral Daily  . simvastatin  20 mg Oral QPM  . sodium chloride flush  3 mL Intravenous Q12H    BMET    Component Value Date/Time   NA 131 (L) 12/01/2020 0658   K 3.0 (L) 12/01/2020 6237  CL 97 (L) 12/01/2020 0658   CO2 25 12/01/2020 0658   GLUCOSE 88 12/01/2020 0658   BUN 35 (H) 12/01/2020 0658   CREATININE 4.51 (H) 12/01/2020 0658   CALCIUM 7.7 (L) 12/01/2020 0658   GFRNONAA 13 (L) 12/01/2020 0658   GFRAA 19 (L) 06/15/2018 0407   CBC    Component Value Date/Time   WBC 14.0 (H) 11/28/2020 0459   RBC 2.71 (L) 11/28/2020 0459   HGB 7.9 (L) 11/28/2020 0459   HCT 24.6 (L) 11/28/2020 0459   PLT 145 (L) 11/28/2020 0459   MCV 90.8 11/28/2020 0459   MCH 29.2 11/28/2020 0459   MCHC 32.1 11/28/2020 0459   RDW 19.3 (H) 11/28/2020 0459   LYMPHSABS 0.8 11/28/2020 0459   MONOABS 1.2 (H) 11/28/2020 0459   EOSABS 0.0 11/28/2020 0459   BASOSABS 0.0 11/28/2020 0459   Assessment/Plan:  1. RecurrentSyncope/hypotension; sig orthostasis/autonomic dysfunction:keeping even on HD, UOP adequate 1. Midodrine, florinef, pyridostigmine 2. Maintain abd binder and compression stockings 3. Symptoms persist despite efforts, little to offer. Palliative  on board and full code / full scope at this time 4. Maybe he could see EP locally as outpt? 5. Req stretcher/bed for HD 2. Colitis- GI following s/p cipro/flagyl, colonoscopy (w/ blood and colitis), and biopsies.  3. Urinary retention- unclear if medication associated or BPH. Continue foley and likely urology eval outpt.  4. [facility] on [facility]. 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 1. No amyloid deposits reported. 2. Does not have sufficient GFR to stay off HD after trial; resumed 12/28 3. No fluid removal needed; euvolemic and UOP intact 4. Despite aggressive therapies and time to address his acute issues, it appears he remains too orthostatic for chair HD at this point based on PT notes.  CM looking for SNF/oupt HD unit that can facilitate stretcher HD. 5. Anemia:of CKD w/ possible GI blood loss- s/p blood transfusion. No heparin with HD. GI on board, colonoscopyfindings as above. Rec 1u prbc 11/22 1. Transfuse as needed to keep Hgb >7 2. On Aranesp weekly- iron stores low on 12/10 and repleted 6. CKD-MBD: calcitriol 0.77mcg daily. P ok w/o binder 7. Severe protein malnutrition:per primary, push protein 8. Vascular access- TDC at this time, outpt vascular f/u based upon his response 9. A fib- on amiodarone and eliquis (off A/C as of right now) 10. Hyponatremia. Mild, stable and asymptomatic.   11. Hypokalemia: improved w/ repletion 12. Disposition- as above, will need SNF/Stretcher option if can't tolerated chair HD  Rexene Agent  [name]

## 2020-12-02 DIAGNOSIS — R55 Syncope and collapse: Secondary | ICD-10-CM | POA: Diagnosis not present

## 2020-12-02 DIAGNOSIS — Z992 Dependence on renal dialysis: Secondary | ICD-10-CM | POA: Diagnosis not present

## 2020-12-02 DIAGNOSIS — K529 Noninfective gastroenteritis and colitis, unspecified: Secondary | ICD-10-CM | POA: Diagnosis not present

## 2020-12-02 DIAGNOSIS — N186 End stage renal disease: Secondary | ICD-10-CM | POA: Diagnosis not present

## 2020-12-02 LAB — RENAL FUNCTION PANEL
Albumin: 2 g/dL — ABNORMAL LOW (ref 3.5–5.0)
Anion gap: 8 (ref 5–15)
BUN: 44 mg/dL — ABNORMAL HIGH (ref 8–23)
CO2: 23 mmol/L (ref 22–32)
Calcium: 7.9 mg/dL — ABNORMAL LOW (ref 8.9–10.3)
Chloride: 95 mmol/L — ABNORMAL LOW (ref 98–111)
Creatinine, Ser: 5.55 mg/dL — ABNORMAL HIGH (ref 0.61–1.24)
GFR, Estimated: 10 mL/min — ABNORMAL LOW (ref >60)
Glucose, Bld: 130 mg/dL — ABNORMAL HIGH (ref 70–99)
Phosphorus: 4.2 mg/dL (ref 2.5–4.6)
Potassium: 3.4 mmol/L — ABNORMAL LOW (ref 3.5–5.1)
Sodium: 126 mmol/L — ABNORMAL LOW (ref 135–145)

## 2020-12-02 NOTE — Progress Notes (Signed)
PROGRESS NOTE  [name] LFY:[id number] DOB: [birth year] DOA: 11/02/2020 PCP: Patient, No Pcp Per  Brief History: 70yo male with history of diabetes mellitus, CKD stage V started recently on dialysis Monday Wednesday Friday and still making urine, recent A. fib and was restarted on amiodarone metoprolol and Eliquis was sent fromSNFafter he was noted to have an episode of syncope while getting up to go to hemodialysis.On arrival to the ED his systolic blood pressures were in the 70 mmHg range. He is noted to have some diarrhea, but he has recently completed course of ciprofloxacin and Flagyl by 11/23. He was noted to have nonspecific colitis on pathology after recent colonoscopy on 11/17 and was started on prednisone taper which he continues to take at this time.He was just discharged after admission from 10/12/20 to 11/01/20 when he was treated for syncope from orthostasis as well as colitis as discussed above. He also required 5 units PRBC last admission. Hewas started on Solu-Medrol 60 mg daily 11/19--per GI>>po 40 mg daily--wean by 10 mg per week--started30 mg daily on 11/02/20. He is now finished with steroids   ED Course:Patient had received 250 mL of normal saline fluid bolus with improvement in blood pressure readings. Manual blood pressures confirm systolics in the low 852 range and heart rates fluctuate in the low 100 range. He denies any palpitations or chest pains or shortness of breath. Laboratory data with stable hemoglobin levels noted and he is noted to be hypokalemic with potassium 2.9 and he has a lactic acid level 2.6. Covid testing ordered and pending.  Assessment/Plan: Orthostatic hypotension -continues to be orthostatic despite pyridostigmine, midodrine and florinef -this lead to repeat admission after d/c on 11/01/20 -10/13/20--EchoEF 77-82%UMPNT 1 diastolic dysfunction -addedabdominal binder and knee stockings -decrease metoprolol to once  daily -am cortisol 23.1 -TSH 1.912 -not a candidate for Northera -case discussed with renal, Dr. Libby Maw d/c with stretcher HD until able to tolerate sitting and standing -11/30/20--discussed case with Loleta Books, Dr. Ace Gins additional to offer, agrees with our current treatment -OOB with each shift;  Check orthostatic daily  Chronic Diarrhea/Colitis -presented with diarrhea and LGIB last admission in Nov 2021 -improving with imodium -initially treated with cip/fl and then 4 weeks of steroid taper -10/15/20, 11/06/20 and12/22/21 GI pathogen panel neg -11/02/20 Cdiff neg -11/05/20 Stool O&P--neg -GI following -10/17/20 colonoscopy-area of moderately congested mucosa with ulceration was found in the sigmoid colon and in the descending colon.Bllood noted in rectum, sigmoid and descending colon -11/21/20 colonoscopy--polyps in asc colon & 2 polyps hepatic flexure, extensive circumferential ulcers in distal sigmoid -11/17/21Biopsies of rectum and sigmoid colon show active nonspecific colitis w/ ulceration c/f medication induced (NSAIDs), ischemia, or stercoral proctitis -12//22/21 TI and colon biopsies--neg for inflammation; +tubular adenmoa -GI suspects ischemic injury -Fecal elastase and qualitative fecal fat analysisordered by GI -11/30/20--discussed case with Loleta Books, Dr. Ace Gins additional to offer, agrees with our current treatment   Anemia of CKD -had rectal bleed last admit requiring 5 units PRBC -11/21/20--received 1 unit this admit -multifactorial including CKD, blood draws, chronic disease -iron studies consistent with ACD  ESRD -Biopsy results from outpatient nephrologist moderate to severe arterial nephrosclerosis with 75% global glomerulosclerosis, focal and segmental glomerular atrophic scarring and diffuse moderate to severe tubulointerstitial scarring --started recently on dialysis MWF --HD on 12/28--unable to remove fluid --having  intradialytic hypotension already on midodrine-->having difficulty removing fluid  Recent paroxysmal A. fib --started on amiodaronelast admission --Eliquis--holdingdue to his  rectal bleeding and acute blood loss anemia --Patient understands risks and benefits of holding anticoagulation and he seems to be reluctant to start it again-we will need close follow-up with his cardiologist/PCP. --metoprolol stopped due to orthostasis initially --start metoprolol 12.5 mg bid as pt having more afib RVR episodesfrom last admission>>decrease to once daily  Hyponatremia, --sodiumis low continue to adjust dialysis.  Hyperlipidemia: --Continue statins.  Acute urinary retention- s/p foley placement 12/16. Foley removed 12/23 and he had been voiding. Unfortunately he developed acute urinary retention again on 12/25 and foley was replaced with >900 mL urine relieved. Will leave foley in place for now until he can follow up outpatient with urology.  Intermittent Hematuria -likely due to foley trauma and intermittent movement -anchor foley -am CBC -UA/urine culture  Goals of Care -long discussion with patient's daughter at bedside 11/29/20 Advance care planning, including the explanation and discussion of advance directives was carried out with the patient and family. Code status including explanations of "Full Code" and "DNR" and alternatives were discussed in detail. Discussion of end-of-life issues including but not limited palliative care, hospice care and the concept of hospice, other end-of-life care options, power of attorney for health care decisions, living wills, and physician orders for life-sustaining treatment were also discussed with the patient and family. Total face to face time 36 minutes. -patient wants to continue full scope of care -12/02/19--confirms full scope of care;  Plan to d/c to SNF and stretcher HD facility    Status is: Inpatient  Remains inpatient  appropriate because:IV treatments appropriate due to intensity of illness or inability to take PO Remains orthostatic putting him at high risk for re-admission Also unable to remove fluid on HD   Dispo: Patient From: [facility] Planned Disposition: [place] Expected discharge date: 12/04/2020 Medically stable for discharge: No         Family Communication: daughter updated 12/01/20; significant other updated 12/02/20  Consultants: renal, GI, palliative  Code Status: FULL   DVT Prophylaxis: SCDs   Procedures: As Listed in Progress Note Above  Antibiotics: None  Total time spent 35 minutes. Greater than 50% spent face to face counseling and coordinating care.   RN Pressure Injury Documentation: Pressure Injury 10/13/20 Sacrum Circumferential Stage 2 -  Partial thickness loss of dermis presenting as a shallow open injury with a red, pink wound bed without slough. (Active)  10/13/20 1330  Location: Sacrum  Location Orientation: Circumferential  Staging: Stage 2 -  Partial thickness loss of dermis presenting as a shallow open injury with a red, pink wound bed without slough.  Wound Description (Comments):   Present on Admission: Yes        Subjective: Patient denies fevers, chills, headache, chest pain, dyspnea, nausea, vomiting, diarrhea, abdominal pain, dysuria, hematuria, hematochezia, and melena. Still feels dizzy when getting up  Objective: Vitals:   12/01/20 2145 12/02/20 0529 12/02/20 0545 12/02/20 1442  BP: 134/77 132/82  (!) 156/73  Pulse: 66 71  (!) 59  Resp: 15 17    Temp: 97.6 F (36.4 C) 97.6 F (36.4 C)  98.1 F (36.7 C)  TempSrc:    Oral  SpO2: 100% 100%  100%  Weight:   64.7 kg   Height:        Intake/Output Summary (Last 24 hours) at 12/02/2020 1707 Last data filed at 12/02/2020 1500 Gross per 24 hour  Intake 480 ml  Output 675 ml  Net  -195 ml   Weight change: -0.4 kg  Exam:   General:  Pt is alert, follows commands appropriately, not in acute distress  HEENT: No icterus, No thrush, No neck mass, [place]/AT  Cardiovascular: RRR, S1/S2, no rubs, no gallops  Respiratory: bibasilar crackles. No wheeze  Abdomen: Soft/+BS, non tender, non distended, no guarding  Extremities: No edema, No lymphangitis, No petechiae, No rashes, no synovitis   Data Reviewed: I have personally reviewed following labs and imaging studies Basic Metabolic Panel: Recent Labs  Lab 11/28/20 0459 11/29/20 0526 11/30/20 0621 12/01/20 0658 12/02/20 0557  NA 130* 131* 128* 131* 126*  K 2.9* 3.5 3.4* 3.0* 3.4*  CL 94* 95* 94* 97* 95*  CO2 24 25 22 25 23   GLUCOSE 98 136* 97 88 130*  BUN 31* 44* 54* 35* 44*  CREATININE 3.51* 5.04* 6.03* 4.51* 5.55*  CALCIUM 7.5* 7.9* 8.0* 7.7* 7.9*  PHOS 2.4* 3.3 3.6 3.1 4.2   Liver Function Tests: Recent Labs  Lab 11/28/20 0459 11/29/20 0526 11/30/20 0621 12/01/20 0658 12/02/20 0557  ALBUMIN 2.3* 2.2* 2.2* 2.0* 2.0*   No results for input(s): LIPASE, AMYLASE in the last 168 hours. No results for input(s): AMMONIA in the last 168 hours. Coagulation Profile: No results for input(s): INR, PROTIME in the last 168 hours. CBC: Recent Labs  Lab 11/26/20 0539 11/27/20 1400 11/28/20 0459  WBC 15.4* 14.3* 14.0*  NEUTROABS  --   --  11.8*  HGB 7.9* 7.5* 7.9*  HCT 26.0* 23.6* 24.6*  MCV 94.5 92.9 90.8  PLT 211 165 145*   Cardiac Enzymes: No results for input(s): CKTOTAL, CKMB, CKMBINDEX, TROPONINI in the last 168 hours. BNP: Invalid input(s): POCBNP CBG: No results for input(s): GLUCAP in the last 168 hours. HbA1C: No results for input(s): HGBA1C in the last 72 hours. Urine analysis:    Component Value Date/Time   COLORURINE AMBER (A) 12/01/2020 1750   APPEARANCEUR CLOUDY (A) 12/01/2020 1750   LABSPEC 1.008 12/01/2020 1750   PHURINE 6.0 12/01/2020 1750   GLUCOSEU NEGATIVE 12/01/2020 1750    HGBUR LARGE (A) 12/01/2020 1750   BILIRUBINUR NEGATIVE 12/01/2020 1750   KETONESUR NEGATIVE 12/01/2020 1750   PROTEINUR 100 (A) 12/01/2020 1750   NITRITE NEGATIVE 12/01/2020 1750   LEUKOCYTESUR LARGE (A) 12/01/2020 1750   Sepsis Labs: @LABRCNTIP (procalcitonin:4,lacticidven:4) )No results found for this or any previous visit (from the past 240 hour(s)).   Scheduled Meds: . amiodarone  200 mg Oral Daily  . bismuth subsalicylate  30 mL Oral Q6H  . calcitRIOL  0.5 mcg Oral Daily  . Chlorhexidine Gluconate Cloth  6 each Topical Q0600  . Chlorhexidine Gluconate Cloth  6 each Topical Q0600  . darbepoetin (ARANESP) injection - DIALYSIS  200 mcg Intravenous Q Mon-HD  . famotidine  20 mg Oral Daily  . feeding supplement  237 mL Oral BID BM  . finasteride  5 mg Oral Daily  . fludrocortisone  0.2 mg Oral Daily  . loperamide  2 mg Oral TID  . metoprolol tartrate  12.5 mg Oral QHS  . midodrine  10 mg Oral TID with meals  . multivitamin  1 tablet Oral QHS  . psyllium  1 packet Oral QHS  . pyridostigmine  60 mg Oral Daily  . simvastatin  20 mg Oral QPM  . sodium chloride flush  3 mL Intravenous Q12H   Continuous Infusions: . sodium chloride    . sodium chloride    . sodium chloride      Procedures/Studies: MR BRAIN WO CONTRAST  Result Date: 11/16/2020 CLINICAL  DATA:  Question deposition disease. Hypotension. Altered mental status. EXAM: MRI HEAD WITHOUT CONTRAST TECHNIQUE: Multiplanar, multiecho pulse sequences of the brain and surrounding structures were obtained without intravenous contrast. COMPARISON:  Head CT 01/25/2020 FINDINGS: Brain: Diffusion imaging does not show any acute or subacute infarction. No focal abnormality affects brainstem or cerebellum. Cerebral hemispheres show mild age related volume loss without evidence of small-vessel disease or large vessel infarction. No mass lesion, hemorrhage, hydrocephalus or extra-axial collection. Vascular: Major vessels at the base of  the brain show flow. Skull and upper cervical spine: Negative Sinuses/Orbits: Clear/normal Other: None IMPRESSION: No acute or reversible finding. Mild age related volume loss. No evidence of small-vessel disease or large vessel infarction. Electronically Signed   By: Nelson Chimes M.D.   On: 11/16/2020 14:01   DG Abd Portable 1V  Result Date: 11/28/2020 CLINICAL DATA:  Abdominal distension abdominal pain EXAM: PORTABLE ABDOMEN - 1 VIEW COMPARISON:  November 07, 2020 CT assessment, abdominal plain film from November 15, 2020 FINDINGS: Decreased distension of bowel loops in the abdomen when compared to the study from November 15, 2020. Mild distension currently of scattered gas-filled loops of small bowel. Suggestion of small amount of proximal rectal gas. No acute skeletal process on limited assessment. Signs of basilar atelectasis. IMPRESSION: Decreased distension of bowel loops in the abdomen when compared to the study from November 15, 2020. Mild distension currently of scattered gas-filled loops of small bowel may reflect mild ileus. Suggestion of small amount of proximal rectal gas. Electronically Signed   By: Zetta Bills M.D.   On: 11/28/2020 16:15   DG Abd Portable 1V  Result Date: 11/15/2020 CLINICAL DATA:  Abdominal distension EXAM: PORTABLE ABDOMEN - 1 VIEW COMPARISON:  10/25/2020 FINDINGS: The subdiaphragmatic region and right flank are excluded from view. Multiple gas-filled prominent loops of large and small bowel are seen throughout the visualized abdomen in keeping with un underlying ileus. No gross free intraperitoneal gas. Gas and stool is seen within the rectal vault. No organomegaly. No acute bone abnormality. IMPRESSION: Mild ileus. Electronically Signed   By: Fidela Salisbury MD   On: 11/15/2020 04:15   CT Angio Abd/Pel w/ and/or w/o  Result Date: 11/07/2020 CLINICAL DATA:  Evaluate for mesenteric ischemia. End-stage renal disease on dialysis. Bowel wall thickening on exam from  10/12/2020. EXAM: CTA ABDOMEN AND PELVIS WITHOUT AND WITH CONTRAST TECHNIQUE: Multidetector CT imaging of the abdomen and pelvis was performed using the standard protocol during bolus administration of intravenous contrast. Multiplanar reconstructed images and MIPs were obtained and reviewed to evaluate the vascular anatomy. CONTRAST:  27mL OMNIPAQUE IOHEXOL 350 MG/ML SOLN COMPARISON:  10/22/2020 FINDINGS: VASCULAR Aorta: Mild atherosclerotic disease in the abdominal aorta without aneurysm or dissection. Celiac: Celiac trunk is patent without significant stenosis. Focal dilatation of the trunk measures up to 9 mm. Main branch vessels are patent. SMA: Replaced right hepatic artery. SMA is widely patent without significant stenosis. No evidence for aneurysm or dissection. Renals: Main right renal artery is widely patent without stenosis, aneurysm or dissection. There is small accessory right renal artery. High-grade focal narrowing in the proximal main left renal artery without significant plaque in this area. Findings could be related to a dissection but indeterminate. Small accessory left renal artery is patent. IMA: Patent Inflow: Atherosclerotic disease involving the proximal left common iliac artery without significant stenosis. Common iliac arteries are patent bilaterally. Disease and stenosis involving the right internal iliac artery. Left internal iliac artery is patent. Bilateral external iliac arteries  are widely patent. Proximal Outflow: Proximal femoral arteries are patent bilaterally. Veins: Main portal venous system is patent. Limited evaluation of the superior cavoatrial junction on the arterial phase imaging due to non-opacified blood in this area. IVC and renal veins are patent. Narrowing of the left common iliac vein from the right common iliac artery is a normal anatomic variant. Review of the MIP images confirms the above findings. NON-VASCULAR Lower chest: Small bilateral pleural effusions with  compressive atelectasis. Hepatobiliary: High-density material in the gallbladder are suggestive for small stones. The gallbladder is moderately distended without definite inflammatory changes. There is colon anterior to the liver. No discrete liver lesion. No biliary dilatation. Gallbladder distension has minimally changed since the previous examination. Pancreas: Unremarkable. No pancreatic ductal dilatation or surrounding inflammatory changes. Spleen: Normal in size without focal abnormality. Adrenals/Urinary Tract: Normal appearance of the adrenal glands. Both kidneys are small with scattered calcifications. Findings are suggestive for nonobstructive renal calculi. Stomach/Bowel: Normal appearance of the stomach and duodenum. There is a rectal catheter with an inflated balloon. Sigmoid colon is moderately distended with mild wall thickening. However, the sigmoid wall thickening is less impressive on the venous phase imaging. Cecum is distended and contains a large amount of stool. Transverse colon is distended with gas. Lymphatic: No significant lymph node enlargement in the abdomen or pelvis. Reproductive: Stable appearance of the prostate. Other: Diffuse subcutaneous edema. Small amount of pelvic ascites which is new. Trace ascites in the left lower quadrant of the abdomen. Trace ascites in the upper abdomen. Negative for free air. Musculoskeletal: Chronic disc space narrowing with endplate changes at G3-T5. Stable disc space narrowing at L5-S1. IMPRESSION: VASCULAR 1. Atherosclerotic disease in the abdominal aorta without aneurysm or significant stenosis. Aortic Atherosclerosis (ICD10-I70.0). 2. Main mesenteric arteries are patent without significant stenosis. No evidence to suggest chronic mesenteric ischemia. 3. Multiple renal arteries as described. Focal narrowing the proximal left main renal artery that could be related to atherosclerotic disease or focal dissection in this area. NON-VASCULAR 1. The  sigmoid colon has been partially decompressed since 10/22/2020 and placement of the rectal tube. There continues to be gaseous distension of the sigmoid colon and transverse colon. Mild wall thickening in the sigmoid colon is nonspecific but may be related to the partial decompression rather than infectious or inflammatory process. Findings are suggestive for an underlying colonic ileus. 2. Bilateral kidneys are atrophic with bilateral calcifications. Findings are compatible with history of end-stage renal disease. Calcifications could represent nonobstructive renal calculi. 3. Diffuse subcutaneous edema with small amount of ascites, most prominent in the pelvis. 4. Significant disc space disease at L2-L3. 5. Bilateral pleural effusions with compressive atelectasis at the lung bases. 6. Cholelithiasis with moderate gallbladder distension. Electronically Signed   By: Markus Daft M.D.   On: 11/07/2020 17:58    Orson Eva, DO  [facility]  If 7PM-7AM, please contact night-coverage [email or web address] Password [email or web address] 12/02/2020, 5:07 PM   LOS: 30 days

## 2020-12-03 DIAGNOSIS — I951 Orthostatic hypotension: Secondary | ICD-10-CM | POA: Diagnosis not present

## 2020-12-03 DIAGNOSIS — Z992 Dependence on renal dialysis: Secondary | ICD-10-CM | POA: Diagnosis not present

## 2020-12-03 DIAGNOSIS — N186 End stage renal disease: Secondary | ICD-10-CM | POA: Diagnosis not present

## 2020-12-03 DIAGNOSIS — Z515 Encounter for palliative care: Secondary | ICD-10-CM | POA: Diagnosis not present

## 2020-12-03 DIAGNOSIS — K529 Noninfective gastroenteritis and colitis, unspecified: Secondary | ICD-10-CM | POA: Diagnosis not present

## 2020-12-03 DIAGNOSIS — Z7189 Other specified counseling: Secondary | ICD-10-CM | POA: Diagnosis not present

## 2020-12-03 LAB — CBC
HCT: 26.1 % — ABNORMAL LOW (ref 39.0–52.0)
Hemoglobin: 8.1 g/dL — ABNORMAL LOW (ref 13.0–17.0)
MCH: 29.7 pg (ref 26.0–34.0)
MCHC: 31 g/dL (ref 30.0–36.0)
MCV: 95.6 fL (ref 80.0–100.0)
Platelets: 269 10*3/uL (ref 150–400)
RBC: 2.73 MIL/uL — ABNORMAL LOW (ref 4.22–5.81)
RDW: 19.6 % — ABNORMAL HIGH (ref 11.5–15.5)
WBC: 11.6 10*3/uL — ABNORMAL HIGH (ref 4.0–10.5)
nRBC: 0 % (ref 0.0–0.2)

## 2020-12-03 LAB — RENAL FUNCTION PANEL
Albumin: 2 g/dL — ABNORMAL LOW (ref 3.5–5.0)
Anion gap: 12 (ref 5–15)
BUN: 52 mg/dL — ABNORMAL HIGH (ref 8–23)
CO2: 22 mmol/L (ref 22–32)
Calcium: 7.8 mg/dL — ABNORMAL LOW (ref 8.9–10.3)
Chloride: 95 mmol/L — ABNORMAL LOW (ref 98–111)
Creatinine, Ser: 6.17 mg/dL — ABNORMAL HIGH (ref 0.61–1.24)
GFR, Estimated: 9 mL/min — ABNORMAL LOW (ref >60)
Glucose, Bld: 95 mg/dL (ref 70–99)
Phosphorus: 5 mg/dL — ABNORMAL HIGH (ref 2.5–4.6)
Potassium: 3.3 mmol/L — ABNORMAL LOW (ref 3.5–5.1)
Sodium: 129 mmol/L — ABNORMAL LOW (ref 135–145)

## 2020-12-03 LAB — PANCREATIC ELASTASE, FECAL: Pancreatic Elastase-1, Stool: 369 ug Elast./g (ref >200)

## 2020-12-03 MED ORDER — ALBUMIN HUMAN 25 % IV SOLN
12.5000 g | Freq: Once | INTRAVENOUS | Status: AC
  Administered 2020-12-03: 12.5 g via INTRAVENOUS

## 2020-12-03 MED ORDER — SODIUM CHLORIDE 0.9 % IV SOLN
1.0000 g | INTRAVENOUS | Status: DC
  Administered 2020-12-03 – 2020-12-05 (×3): 1 g via INTRAVENOUS
  Filled 2020-12-03 (×3): qty 10

## 2020-12-03 MED ORDER — HEPARIN SODIUM (PORCINE) 1000 UNIT/ML DIALYSIS: 40.0000 [IU]/kg | INTRAMUSCULAR | Status: DC | PRN

## 2020-12-03 MED ORDER — HEPARIN SODIUM (PORCINE) 1000 UNIT/ML IJ SOLN: 3800.0000 [IU] | Freq: Once | INTRAMUSCULAR | Status: DC

## 2020-12-03 MED ORDER — HEPARIN SODIUM (PORCINE) 1000 UNIT/ML IJ SOLN
3800.0000 [IU] | INTRAMUSCULAR | Status: DC | PRN
  Administered 2020-12-10: 3800 [IU]
  Filled 2020-12-03 (×3): qty 4

## 2020-12-03 NOTE — Progress Notes (Signed)
Palliative: [name] is lying quietly in bed.  He is resting comfortably, but wakes easily when I enter the room.  He greets me making and somewhat keeping eye contact.  He appears somewhat frail and subdued.  He is alert and oriented x3, able to make his needs known.  There is no family at bedside at this time.  We talked about the treatment plan.  [name] tells me that he has only been on dialysis for about 8 weeks, but feels that he is doing okay.  He tells me that he is having less frequent stools, and they are more formed.  He agrees that he is quite weak, and unable to stand for more than a few seconds at a time.  We talked about transition of care team working to find short-term rehab for strength training.  At this time, [name] remains agreeable to short-term rehab.  I also share that transition of care team is working to ensure that he has a place for hemodialysis.  We review healthcare power of attorney.  [name] has named his daughter, [name], as his healthcare surrogate. We also talked about CODE STATUS.  [name] states that he would like attempted resuscitation and life support if needed.  I ask how long he would like to be on life support, 2 weeks, 2 months, forever?  [name] replies, "as long as I am functional".  We talked about functional ability when a person requires ventilator support.  I encourage [name] to consider his choices, and to share these choices with his healthcare surrogate.  Conference with attending, bedside nursing staff, transition of care team related to patient condition, needs, goals of care discussions.  Plan: Continue full scope/full code.  Agreeable to short-term rehab.  40 minutes Quinn Axe, NP Palliative medicine team Team phone [phone] Greater than 50% of this time was spent counseling and coordinating care related to the above assessment and plan.

## 2020-12-03 NOTE — Procedures (Addendum)
   HEMODIALYSIS TREATMENT NOTE:  Primary nurse Lowry Ram reports symptomatic orthostatic hypotension on second shift, that SBP dropped to 70s when pt positioned from lying to sitting.    We talked about needing to try to mimic outpatient HD recliner position using various bed positions, as BP and symptoms would allow.  Today we attempted to remove 0.5L (have not attempted UF in prior sessions d/t good UOP) but his lying SBP declined below 100 mmHg twice and he was given Albumin 12.5g doses each time.  UF was suspended whenever SBP<100 thereafter.  All blood was returned. Net UF 4cc.  Attempted to obtain orthostatic vitals post-dialysis.   At end of HD: 117/66 p-69 (lying) After blood return:  125/64 p-73 (lying) Sitting:  120/66 p-73 Sitting after 5 minutes:  94/57 p-84  He had required moderate assistance to sit on the edge of the bed and did not want to attempt a standing pressure.  Rockwell Alexandria, RN  -------------------------------------------------- ADDENDUM: Pt is asking about AVF creation.  Says he has had the vein mapping already and asks if Dr. Donnetta Hutching could possibly perform the surgery during this admission.  ap

## 2020-12-03 NOTE — Progress Notes (Signed)
PROGRESS NOTE  [name] IRJ:[id number] DOB: 1951/07/13 DOA: 11/02/2020 PCP: [name]  Brief History: 70yo male with history of diabetes mellitus, CKD stage V started recently on dialysis Monday Wednesday Friday and still making urine, recent A. fib and was restarted on amiodarone metoprolol and Eliquis was sent fromSNFafter he was noted to have an episode of syncope while getting up to go to hemodialysis.On arrival to the ED his systolic blood pressures were in the 70 mmHg range. He is noted to have some diarrhea, but he has recently completed course of ciprofloxacin and Flagyl by 11/23. He was noted to have nonspecific colitis on pathology after recent colonoscopy on 11/17 and was started on prednisone taper which he continues to take at this time.He was just discharged after admission from 10/12/20 to 11/01/20 when he was treated for syncope from orthostasis as well as colitis as discussed above. He also required 5 units PRBC last admission. Hewas started on Solu-Medrol 60 mg daily 11/19--[name]>>po 40 mg daily--wean by 10 mg [name] week--started30 mg daily on 11/02/20. He is now finished with steroids   ED Course:Patient had received 250 mL of normal saline fluid bolus with improvement in blood pressure readings. Manual blood pressures confirm systolics in the low 301 range and heart rates fluctuate in the low 100 range. He denies any palpitations or chest pains or shortness of breath. Laboratory data with stable hemoglobin levels noted and he is noted to be hypokalemic with potassium 2.9 and he has a lactic acid level 2.6. Covid testing ordered and pending.  Assessment/Plan: Orthostatic hypotension -continues to be orthostatic despite pyridostigmine, midodrine and florinef -this lead to repeat admission after d/c on 11/01/20 -10/13/20--EchoEF 60-10%XNATF 1 diastolic dysfunction -addedabdominal binder and knee stockings -decrease metoprolol to once  daily -am cortisol 23.1 -TSH 1.912 -not a candidate for Northera -case discussed with renal, [name] d/c with stretcher HD until able to tolerate sitting and standing -11/30/20--discussed case with [name], [name] additional to offer, agrees with our current treatment -OOB with each shift;  Check orthostatic daily--remains orthostatic  Chronic Diarrhea/Colitis -presented with diarrhea and LGIB last admission in Nov 2021 -improving with imodium -initially treated with cip/fl and then 4 weeks of steroid taper -10/15/20, 11/06/20 and12/22/21 GI pathogen panel neg -11/02/20 Cdiff neg -11/05/20 Stool O&P--neg -GI following -10/17/20 colonoscopy-area of moderately congested mucosa with ulceration was found in the sigmoid colon and in the descending colon.Bllood noted in rectum, sigmoid and descending colon -11/21/20 colonoscopy--polyps in asc colon & 2 polyps hepatic flexure, extensive circumferential ulcers in distal sigmoid -11/17/21Biopsies of rectum and sigmoid colon show active nonspecific colitis w/ ulceration c/f medication induced (NSAIDs), ischemia, or stercoral proctitis -12//22/21 TI and colon biopsies--neg for inflammation; +tubular adenmoa -GI suspects ischemic injury -Fecal elastase and qualitative fecal fat analysisordered by GI -11/30/20--discussed case with [name], [name] additional to offer, agrees with our current treatment   Anemia of CKD -had rectal bleed last admit requiring 5 units PRBC -11/21/20--received 1 unit this admit -multifactorial including CKD, blood draws, chronic disease -iron studies consistent with ACD  ESRD -Biopsy results from outpatient nephrologist moderate to severe arterial nephrosclerosis with 75% global glomerulosclerosis, focal and segmental glomerular atrophic scarring and diffuse moderate to severe tubulointerstitial scarring --started recently on dialysis MWF --HD on 12/28--unable to remove  fluid --having intradialytic hypotension already on midodrine-->having difficulty removing fluid -discussed with [name] to position in sitting position for HD  Recent paroxysmal A. fib --started on amiodaronelast admission --Eliquis--holdingdue to his rectal bleeding and acute blood loss anemia --Patient understands risks and benefits of holding anticoagulation and he seems to be reluctant to start it again-we will need close follow-up with his cardiologist/PCP. --metoprolol stopped due to orthostasis initially --start metoprolol 12.5 mg bid as pt having more afib RVR episodesfrom last admission>>decrease to once daily  Hyponatremia, --sodiumis low continue to adjust dialysis.  Hyperlipidemia: --Continue statins.  Acute urinary retention- s/p foley placement 12/16. Foley removed 12/23 and he had been voiding. Unfortunately he developed acute urinary retention again on 12/25 and foley was replaced with >900 mL urine relieved. Will leave foley in place for now until he can follow up outpatient with urology.  Intermittent Hematuria/CAUTI -likely due to foley trauma and intermittent movement/UTI -anchor foley -am CBC--Hgb stable -UA>50WBC -urine culture= serratia -start empiric ceftriaxone  Goals of Care -long discussion with patient's daughter at bedside 11/29/20 Advance care planning, including the explanation and discussion of advance directives was carried out with the patient and family. Code status including explanations of "Full Code" and "DNR" and alternatives were discussed in detail. Discussion of end-of-life issues including but not limited palliative care, hospice care and the concept of hospice, other end-of-life care options, power of attorney for health care decisions, living wills, and physician orders for life-sustaining treatment were also discussed with the patient and family. Total face to face time 36 minutes. -patient wants to continue  full scope of care -12/02/19--confirms full scope of care; Plan to d/c to SNF and stretcher HD facility -12/04/19--[name] appears to have little motivation--refuses to sit up in chair; minimal effort with PT    Status is: Inpatient  Remains inpatient appropriate because:IV treatments appropriate due to intensity of illness or inability to take PO Remains orthostatic putting him at high risk for re-admission Also unable to remove fluid on HD   Dispo: Patient From: [facility] Planned Disposition: [facility] Expected discharge date: 12/04/2020 Medically stable for discharge: No         Family Communication: daughter updated 12/01/20; significant other updated 12/02/20  Consultants: renal, GI, palliative  Code Status: FULL   DVT Prophylaxis: SCDs   Procedures: As Listed in Progress Note Above  Antibiotics: None  Subjective: Patient denies fevers, chills, headache, chest pain, dyspnea, nausea, vomiting, diarrhea, abdominal pain, dysuria, hematuria, hematochezia, and melena.   Objective: Vitals:   12/02/20 2103 12/02/20 2121 12/03/20 0546 12/03/20 0832  BP: (!) 161/77 139/76 140/83 117/79  Pulse: 66 66 63   Resp: 18 20 20    Temp: 98.1 F (36.7 C) 98.3 F (36.8 C) 98.2 F (36.8 C)   TempSrc: Oral Oral Oral   SpO2: 100% 99% 99%   Weight:   64.9 kg   Height:        Intake/Output Summary (Last 24 hours) at 12/03/2020 1712 Last data filed at 12/03/2020 1300 Gross [name] 24 hour  Intake 480 ml  Output 350 ml  Net 130 ml   Weight change: 0.2 kg Exam:   General:  Pt is alert, follows commands appropriately, not in acute distress  HEENT: No icterus, No thrush, No neck mass, [place]/AT  Cardiovascular: RRR, S1/S2, no rubs, no gallops  Respiratory: CTA bilaterally, no wheezing, no crackles, no rhonchi  Abdomen: Soft/+BS, non tender, non distended, no  guarding  Extremities: No edema, No lymphangitis, No petechiae, No rashes, no synovitis   Data Reviewed: I have personally reviewed following labs and imaging studies  Basic Metabolic Panel: Recent Labs  Lab 11/29/20 0526 11/30/20 0621 12/01/20 0658 12/02/20 0557 12/03/20 0509  NA 131* 128* 131* 126* 129*  K 3.5 3.4* 3.0* 3.4* 3.3*  CL 95* 94* 97* 95* 95*  CO2 25 22 25 23 22   GLUCOSE 136* 97 88 130* 95  BUN 44* 54* 35* 44* 52*  CREATININE 5.04* 6.03* 4.51* 5.55* 6.17*  CALCIUM 7.9* 8.0* 7.7* 7.9* 7.8*  PHOS 3.3 3.6 3.1 4.2 5.0*   Liver Function Tests: Recent Labs  Lab 11/29/20 0526 11/30/20 0621 12/01/20 0658 12/02/20 0557 12/03/20 0509  ALBUMIN 2.2* 2.2* 2.0* 2.0* 2.0*   No results for input(s): LIPASE, AMYLASE in the last 168 hours. No results for input(s): AMMONIA in the last 168 hours. Coagulation Profile: No results for input(s): INR, PROTIME in the last 168 hours. CBC: Recent Labs  Lab 11/27/20 1400 11/28/20 0459 12/03/20 0509  WBC 14.3* 14.0* 11.6*  NEUTROABS  --  11.8*  --   HGB 7.5* 7.9* 8.1*  HCT 23.6* 24.6* 26.1*  MCV 92.9 90.8 95.6  PLT 165 145* 269   Cardiac Enzymes: No results for input(s): CKTOTAL, CKMB, CKMBINDEX, TROPONINI in the last 168 hours. BNP: Invalid input(s): POCBNP CBG: No results for input(s): GLUCAP in the last 168 hours. HbA1C: No results for input(s): HGBA1C in the last 72 hours. Urine analysis:    Component Value Date/Time   COLORURINE AMBER (A) 12/01/2020 1750   APPEARANCEUR CLOUDY (A) 12/01/2020 1750   LABSPEC 1.008 12/01/2020 1750   PHURINE 6.0 12/01/2020 1750   GLUCOSEU NEGATIVE 12/01/2020 1750   HGBUR LARGE (A) 12/01/2020 1750   BILIRUBINUR NEGATIVE 12/01/2020 1750   KETONESUR NEGATIVE 12/01/2020 1750   PROTEINUR 100 (A) 12/01/2020 1750   NITRITE NEGATIVE 12/01/2020 1750   LEUKOCYTESUR LARGE (A) 12/01/2020 1750   Sepsis Labs: @LABRCNTIP (procalcitonin:4,lacticidven:4) ) Recent Results (from the past 240  hour(s))  Culture, Urine     Status: Abnormal (Preliminary result)   Collection Time: 12/01/20  5:50 PM   Specimen: Urine, Catheterized  Result Value Ref Range Status   Specimen Description   Final    URINE, CATHETERIZED Performed at [facility], [address] [place], [place] [address]    Special Requests   Final    NONE Performed at [facility], [address], [place], [place] [address]    Culture (A)  Final    >=100,000 COLONIES/mL SERRATIA MARCESCENS SUSCEPTIBILITIES TO FOLLOW Performed at [facility], [place] [address] [place], [place] [address]    Report Status PENDING  Incomplete     Scheduled Meds: . amiodarone  200 mg Oral Daily  . bismuth subsalicylate  30 mL Oral Q6H  . calcitRIOL  0.5 mcg Oral Daily  . Chlorhexidine Gluconate Cloth  6 each Topical Q0600  . Chlorhexidine Gluconate Cloth  6 each Topical Q0600  . darbepoetin (ARANESP) injection - DIALYSIS  200 mcg Intravenous Q Mon-HD  . famotidine  20 mg Oral Daily  . feeding supplement  237 mL Oral BID BM  . finasteride  5 mg Oral Daily  . fludrocortisone  0.2 mg Oral Daily  . loperamide  2 mg Oral TID  . metoprolol tartrate  12.5 mg Oral QHS  . midodrine  10 mg Oral TID with meals  . multivitamin  1 tablet Oral QHS  . psyllium  1 packet Oral QHS  . pyridostigmine  60 mg Oral Daily  . simvastatin  20 mg Oral QPM  . sodium chloride flush  3  mL Intravenous Q12H   Continuous Infusions: . sodium chloride    . sodium chloride    . sodium chloride      Procedures/Studies: MR BRAIN WO CONTRAST  Result Date: 11/16/2020 CLINICAL DATA:  Question deposition disease. Hypotension. Altered mental status. EXAM: MRI HEAD WITHOUT CONTRAST TECHNIQUE: Multiplanar, multiecho pulse sequences of the brain and surrounding structures were obtained without intravenous contrast. COMPARISON:  Head CT 01/25/2020 FINDINGS: Brain: Diffusion imaging does not show any acute or subacute infarction. No focal abnormality affects  brainstem or cerebellum. Cerebral hemispheres show mild age related volume loss without evidence of small-vessel disease or large vessel infarction. No mass lesion, hemorrhage, hydrocephalus or extra-axial collection. Vascular: Major vessels at the base of the brain show flow. Skull and upper cervical spine: Negative Sinuses/Orbits: Clear/normal Other: None IMPRESSION: No acute or reversible finding. Mild age related volume loss. No evidence of small-vessel disease or large vessel infarction. Electronically Signed   By: Nelson Chimes M.D.   On: 11/16/2020 14:01   DG Abd Portable 1V  Result Date: 11/28/2020 CLINICAL DATA:  Abdominal distension abdominal pain EXAM: PORTABLE ABDOMEN - 1 VIEW COMPARISON:  November 07, 2020 CT assessment, abdominal plain film from November 15, 2020 FINDINGS: Decreased distension of bowel loops in the abdomen when compared to the study from November 15, 2020. Mild distension currently of scattered gas-filled loops of small bowel. Suggestion of small amount of proximal rectal gas. No acute skeletal process on limited assessment. Signs of basilar atelectasis. IMPRESSION: Decreased distension of bowel loops in the abdomen when compared to the study from November 15, 2020. Mild distension currently of scattered gas-filled loops of small bowel may reflect mild ileus. Suggestion of small amount of proximal rectal gas. Electronically Signed   By: Zetta Bills M.D.   On: 11/28/2020 16:15   DG Abd Portable 1V  Result Date: 11/15/2020 CLINICAL DATA:  Abdominal distension EXAM: PORTABLE ABDOMEN - 1 VIEW COMPARISON:  10/25/2020 FINDINGS: The subdiaphragmatic region and right flank are excluded from view. Multiple gas-filled prominent loops of large and small bowel are seen throughout the visualized abdomen in keeping with un underlying ileus. No gross free intraperitoneal gas. Gas and stool is seen within the rectal vault. No organomegaly. No acute bone abnormality. IMPRESSION: Mild ileus.  Electronically Signed   By: Fidela Salisbury MD   On: 11/15/2020 04:15   CT Angio Abd/Pel w/ and/or w/o  Result Date: 11/07/2020 CLINICAL DATA:  Evaluate for mesenteric ischemia. End-stage renal disease on dialysis. Bowel wall thickening on exam from 10/12/2020. EXAM: CTA ABDOMEN AND PELVIS WITHOUT AND WITH CONTRAST TECHNIQUE: Multidetector CT imaging of the abdomen and pelvis was performed using the standard protocol during bolus administration of intravenous contrast. Multiplanar reconstructed images and MIPs were obtained and reviewed to evaluate the vascular anatomy. CONTRAST:  29mL OMNIPAQUE IOHEXOL 350 MG/ML SOLN COMPARISON:  10/22/2020 FINDINGS: VASCULAR Aorta: Mild atherosclerotic disease in the abdominal aorta without aneurysm or dissection. Celiac: Celiac trunk is patent without significant stenosis. Focal dilatation of the trunk measures up to 9 mm. Main branch vessels are patent. SMA: Replaced right hepatic artery. SMA is widely patent without significant stenosis. No evidence for aneurysm or dissection. Renals: Main right renal artery is widely patent without stenosis, aneurysm or dissection. There is small accessory right renal artery. High-grade focal narrowing in the proximal main left renal artery without significant plaque in this area. Findings could be related to a dissection but indeterminate. Small accessory left renal artery is patent. IMA: Patent Inflow:  Atherosclerotic disease involving the proximal left common iliac artery without significant stenosis. Common iliac arteries are patent bilaterally. Disease and stenosis involving the right internal iliac artery. Left internal iliac artery is patent. Bilateral external iliac arteries are widely patent. Proximal Outflow: Proximal femoral arteries are patent bilaterally. Veins: Main portal venous system is patent. Limited evaluation of the superior cavoatrial junction on the arterial phase imaging due to non-opacified blood in this area. IVC  and renal veins are patent. Narrowing of the left common iliac vein from the right common iliac artery is a normal anatomic variant. Review of the MIP images confirms the above findings. NON-VASCULAR Lower chest: Small bilateral pleural effusions with compressive atelectasis. Hepatobiliary: High-density material in the gallbladder are suggestive for small stones. The gallbladder is moderately distended without definite inflammatory changes. There is colon anterior to the liver. No discrete liver lesion. No biliary dilatation. Gallbladder distension has minimally changed since the previous examination. Pancreas: Unremarkable. No pancreatic ductal dilatation or surrounding inflammatory changes. Spleen: Normal in size without focal abnormality. Adrenals/Urinary Tract: Normal appearance of the adrenal glands. Both kidneys are small with scattered calcifications. Findings are suggestive for nonobstructive renal calculi. Stomach/Bowel: Normal appearance of the stomach and duodenum. There is a rectal catheter with an inflated balloon. Sigmoid colon is moderately distended with mild wall thickening. However, the sigmoid wall thickening is less impressive on the venous phase imaging. Cecum is distended and contains a large amount of stool. Transverse colon is distended with gas. Lymphatic: No significant lymph node enlargement in the abdomen or pelvis. Reproductive: Stable appearance of the prostate. Other: Diffuse subcutaneous edema. Small amount of pelvic ascites which is new. Trace ascites in the left lower quadrant of the abdomen. Trace ascites in the upper abdomen. Negative for free air. Musculoskeletal: Chronic disc space narrowing with endplate changes at D9-I3. Stable disc space narrowing at L5-S1. IMPRESSION: VASCULAR 1. Atherosclerotic disease in the abdominal aorta without aneurysm or significant stenosis. Aortic Atherosclerosis (ICD10-I70.0). 2. Main mesenteric arteries are patent without significant stenosis. No  evidence to suggest chronic mesenteric ischemia. 3. Multiple renal arteries as described. Focal narrowing the proximal left main renal artery that could be related to atherosclerotic disease or focal dissection in this area. NON-VASCULAR 1. The sigmoid colon has been partially decompressed since 10/22/2020 and placement of the rectal tube. There continues to be gaseous distension of the sigmoid colon and transverse colon. Mild wall thickening in the sigmoid colon is nonspecific but may be related to the partial decompression rather than infectious or inflammatory process. Findings are suggestive for an underlying colonic ileus. 2. Bilateral kidneys are atrophic with bilateral calcifications. Findings are compatible with history of end-stage renal disease. Calcifications could represent nonobstructive renal calculi. 3. Diffuse subcutaneous edema with small amount of ascites, most prominent in the pelvis. 4. Significant disc space disease at L2-L3. 5. Bilateral pleural effusions with compressive atelectasis at the lung bases. 6. Cholelithiasis with moderate gallbladder distension. Electronically Signed   By: Markus Daft M.D.   On: 11/07/2020 17:58    Orson Eva, DO  [facility]  If 7PM-7AM, please contact night-coverage [email or web address] Password [email or web address] 12/03/2020, 5:12 PM   LOS: 31 days

## 2020-12-03 NOTE — Progress Notes (Signed)
Patient ID: DYMIR NEESON, male   DOB: 02-Sep-1951, 70 y.o.   MRN: [id number] [facility] Progress Note   Assessment/ Plan:   1.  Recurrent syncope with orthostatic hypotension/autonomic dysfunction: Minimal ultrafiltration with dialysis so as not to exacerbate symptoms with ongoing support using midodrine/fludrocortisone/pyridostigmine.  He is also on external compression devices with abdominal binder and compression stockings.  Planning for possible discharge to an outpatient facility that can accommodate stretcher dialysis. 2. ESRD: Continue with hemodialysis on a Monday/Wednesday/Friday schedule with this time as we explore options for outpatient dialysis unit placement preferably to a unit that would accommodate stretcher hemodialysis due to his propensity for autonomic dysfunction and orthostatic hypotension.  He has trace lower extremity edema. 3. Anemia: With anemia of chronic kidney disease/ESRD.  He does not have any evidence of overt blood loss and we we will continue ESA. 4. CKD-MBD: Calcium and phosphorus level currently at goal without phosphorus binder.  Remains on calcitriol for PTH control. 5. Nutrition: With evidence of protein calorie malnutrition likely exacerbated by recent episode of colitis.  Status post ciprofloxacin and metronidazole. 6.  Colitis: Suspected to be infectious and status post treatment with ciprofloxacin and metronidazole.  Reports some residual semiformed stools.  Subjective:   Reports to be feeling fair and denies any chest pain or shortness of breath.  Still having intermittent diarrhea.   Objective:   BP 117/79   Pulse 63   Temp 98.2 F (36.8 C) (Oral)   Resp 20   Ht 6\' 2"  (1.88 m)   Wt 64.9 kg   SpO2 99%   BMI 18.37 kg/m   Physical Exam: Gen: Appears comfortable resting in bed, oriented to time person and place. CVS: Pulse regular rhythm, normal rate, S1 and S2 normal Resp: Clear to auscultation bilaterally without any rales/rhonchi.   Right IJ TDC in place (high venotomy site) Abd: Soft, flat, nontender, bowel sounds normal Ext: Trace ankle edema bilaterally  Labs: BMET Recent Labs  Lab 11/27/20 0514 11/28/20 0459 11/29/20 0526 11/30/20 0621 12/01/20 0658 12/02/20 0557 12/03/20 0509  NA 129* 130* 131* 128* 131* 126* 129*  K 3.7 2.9* 3.5 3.4* 3.0* 3.4* 3.3*  CL 98 94* 95* 94* 97* 95* 95*  CO2 18* 24 25 22 25 23 22   GLUCOSE 87 98 136* 97 88 130* 95  BUN 75* 31* 44* 54* 35* 44* 52*  CREATININE 6.52* 3.51* 5.04* 6.03* 4.51* 5.55* 6.17*  CALCIUM 8.0* 7.5* 7.9* 8.0* 7.7* 7.9* 7.8*  PHOS 4.6 2.4* 3.3 3.6 3.1 4.2 5.0*   CBC Recent Labs  Lab 11/27/20 1400 11/28/20 0459 12/03/20 0509  WBC 14.3* 14.0* 11.6*  NEUTROABS  --  11.8*  --   HGB 7.5* 7.9* 8.1*  HCT 23.6* 24.6* 26.1*  MCV 92.9 90.8 95.6  PLT 165 145* 269     Medications:    . amiodarone  200 mg Oral Daily  . bismuth subsalicylate  30 mL Oral Q6H  . calcitRIOL  0.5 mcg Oral Daily  . Chlorhexidine Gluconate Cloth  6 each Topical Q0600  . Chlorhexidine Gluconate Cloth  6 each Topical Q0600  . darbepoetin (ARANESP) injection - DIALYSIS  200 mcg Intravenous Q Mon-HD  . famotidine  20 mg Oral Daily  . feeding supplement  237 mL Oral BID BM  . finasteride  5 mg Oral Daily  . fludrocortisone  0.2 mg Oral Daily  . loperamide  2 mg Oral TID  . metoprolol tartrate  12.5 mg Oral QHS  .  midodrine  10 mg Oral TID with meals  . multivitamin  1 tablet Oral QHS  . psyllium  1 packet Oral QHS  . pyridostigmine  60 mg Oral Daily  . simvastatin  20 mg Oral QPM  . sodium chloride flush  3 mL Intravenous Q12H   Elmarie Shiley, MD 12/03/2020, 8:37 AM

## 2020-12-03 NOTE — Progress Notes (Signed)
Physical Therapy Treatment Patient Details Name: [name] MRN: [id number] DOB: Oct 13, 1951 Today's Date: 12/03/2020    History of Present Illness [name] is a 70 y.o. male with medical history significant for type 2 diabetes, CKD stage V recently initiated on hemodialysis MWF, atrial fibrillation/flutter on amiodarone and metoprolol and Eliquis, recurrent orthostatic hypotension, dyslipidemia, and recent discharge on 12/2 for colitis with associated adynamic ileus and acute blood loss anemia who presented back to the ED today after he was noted to have an episode of syncope while getting up to go to hemodialysis from his SNF.  On arrival to the ED his systolic blood pressures were in the 70 mmHg range.  He is noted to have some diarrhea, but he has recently completed course of ciprofloxacin and Flagyl by 11/23.  He was noted to have nonspecific colitis on pathology after recent colonoscopy on 11/17 and was started on prednisone taper which he continues to take at this time.    PT Comments    Attempted to have pt come to sitting position following exercises but pt refused stating that he had just done this with nursing and it did not go very well.  Therapist suggested exercising prior to sitting up to assist in increasing his BP prior to sitting to avoid hypotensive state.     Follow Up Recommendations  SNF     Equipment Recommendations  Rolling walker with 5" wheels    Recommendations for Other Services       Precautions / Restrictions Precautions Precautions: Fall Precaution Comments: monitor BP; nurse states they just tried orthostatic BP and BP fell in sitting             Exercises General Exercises - Upper Extremity Shoulder Flexion: AROM;Strengthening;Both;5 reps Elbow Flexion: Both;10 reps Elbow Extension: 10 reps;Both Wrist Extension: 10 reps General Exercises - Lower Extremity Ankle Circles/Pumps: Both;10 reps Quad Sets: Both;10 reps Gluteal Sets: 10 reps Heel  Slides: Both;10 reps Hip ABduction/ADduction: Both;10 reps Hip Flexion/Marching: Both;10 reps Mini-Sqauts: Other (comment);Supine (bridge; pt unable to clear the bed only off loaded)                    PT Goals (current goals can now be found in the care plan section)   on-going    Frequency    Min 3X/week      PT Plan  continue to encourage activity.          End of Session   Activity Tolerance: Patient limited by fatigue Patient left: in bed;with call bell/phone within reach Nurse Communication: Mobility status PT Visit Diagnosis: Unsteadiness on feet (R26.81);Other abnormalities of gait and mobility (R26.89);Muscle weakness (generalized) (M62.81)     Time: 9678-9381 PT Time Calculation (min) (ACUTE ONLY): 25 min  Charges:  $Therapeutic Exercise: 23-37 mins                      Rayetta Humphrey, PT CLT [phone] 12/03/2020, 4:00 PM

## 2020-12-03 NOTE — TOC Progression Note (Signed)
Transition of Care [facility]) - Progression Note    Patient Details  Name: [name] MRN: [id number] Date of Birth: [birth year]  [facility] CM/SW Contact  Natasha Bence, LCSW Phone Number: 12/03/2020, 4:31 PM  Clinical Narrative:    CSW received bed offer with Wandra Feinstein at [place]. [facility] reported that they are able to transport patient to stretcher dialysis. CSW followed up with [facility] for referral. Rep provided the fax number of [phone] for referral. CSW notified that patient had received 2 dialysis treatments with [facility]. CSW contacted [employer]. Rep with Javier Docker agreeable to submit referral to [facility] with provided fax number. TOC to follow.    Expected Discharge Plan: Skilled Nursing Facility Barriers to Discharge: Continued Medical Work up  Expected Discharge Plan and Services Expected Discharge Plan: [facility]                                               Social Determinants of Health (SDOH) Interventions    Readmission Risk Interventions No flowsheet data found.

## 2020-12-04 DIAGNOSIS — Z992 Dependence on renal dialysis: Secondary | ICD-10-CM | POA: Diagnosis not present

## 2020-12-04 DIAGNOSIS — Z7189 Other specified counseling: Secondary | ICD-10-CM | POA: Diagnosis not present

## 2020-12-04 DIAGNOSIS — I951 Orthostatic hypotension: Secondary | ICD-10-CM | POA: Diagnosis not present

## 2020-12-04 DIAGNOSIS — N186 End stage renal disease: Secondary | ICD-10-CM | POA: Diagnosis not present

## 2020-12-04 LAB — RENAL FUNCTION PANEL
Albumin: 2.2 g/dL — ABNORMAL LOW (ref 3.5–5.0)
Anion gap: 11 (ref 5–15)
BUN: 29 mg/dL — ABNORMAL HIGH (ref 8–23)
CO2: 24 mmol/L (ref 22–32)
Calcium: 7.6 mg/dL — ABNORMAL LOW (ref 8.9–10.3)
Chloride: 96 mmol/L — ABNORMAL LOW (ref 98–111)
Creatinine, Ser: 4.15 mg/dL — ABNORMAL HIGH (ref 0.61–1.24)
GFR, Estimated: 15 mL/min — ABNORMAL LOW (ref >60)
Glucose, Bld: 98 mg/dL (ref 70–99)
Phosphorus: 3.3 mg/dL (ref 2.5–4.6)
Potassium: 3.2 mmol/L — ABNORMAL LOW (ref 3.5–5.1)
Sodium: 131 mmol/L — ABNORMAL LOW (ref 135–145)

## 2020-12-04 LAB — URINE CULTURE: Culture: >=100000 — AB

## 2020-12-04 MED ORDER — PANTOPRAZOLE SODIUM 40 MG PO TBEC
40.0000 mg | DELAYED_RELEASE_TABLET | Freq: Two times a day (BID) | ORAL | Status: DC
  Administered 2020-12-05 – 2020-12-11 (×10): 40 mg via ORAL
  Filled 2020-12-04 (×11): qty 1

## 2020-12-04 NOTE — Progress Notes (Signed)
Patient ID: [name], male   DOB: 05-09-1951, 70 y.o.   MRN: [id number] [facility] Progress Note   Assessment/ Plan:   1.  Recurrent syncope with orthostatic hypotension/autonomic dysfunction: Minimal ultrafiltration with dialysis so as not to exacerbate symptoms with ongoing support using midodrine, fludrocortisone and pyridostigmine.  He is also on external compression devices with abdominal binder and compression stockings.  Case management is assisting with transfer to [facility] for stretcher dialysis (apparently a referral will be sent from [facility] as he was an established patient there).  He will be discharged to [facility], SNF at [facility] upon discharge. 2. ESRD: Continue with hemodialysis on a Monday/Wednesday/Friday schedule possibly in recliner (that has the option of laying him flat should he get symptomatically hypotensive) with no ultrafiltration.  Process underway for placement to the hemodialysis unit that can accommodate stretcher dialysis.  Vascular surgery unavailable at this campus (per my phone call with [facility] scheduling today). 4. CKD-MBD: Calcium and phosphorus level currently at goal without phosphorus binder.  Remains on calcitriol for PTH control. 5. Nutrition: With evidence of protein calorie malnutrition likely exacerbated by recent episode of colitis.  Status post ciprofloxacin and metronidazole. 6.  Colitis: Suspected to be infectious and status post treatment with ciprofloxacin and metronidazole.  Diarrhea improving.  Subjective:   Had episodes of orthostatic hypotension yesterday in the afternoon.  Attempted minimal ultrafiltration of 0.5 L yesterday with dialysis but developed intradialytic hypotension requiring albumin boluses.   Objective:   BP 122/61 (BP Location: Left Arm)   Pulse 64   Temp 98.2 F (36.8 C)   Resp 18   Ht 6\' 2"  (1.88 m)   Wt 65 kg   SpO2 98%   BMI 18.40 kg/m   Physical Exam: Gen: Resting  comfortably in bed, easy to awaken and engage in conversation CVS: Pulse regular rhythm, normal rate, S1 and S2 normal Resp: Clear to auscultation bilaterally without any rales/rhonchi.  Right IJ TDC in place (high venotomy site) Abd: Soft, flat, nontender, bowel sounds normal Ext: Trace ankle edema bilaterally  Labs: BMET Recent Labs  Lab 11/28/20 0459 11/29/20 0526 11/30/20 0621 12/01/20 0658 12/02/20 0557 12/03/20 0509 12/04/20 0500  NA 130* 131* 128* 131* 126* 129* 131*  K 2.9* 3.5 3.4* 3.0* 3.4* 3.3* 3.2*  CL 94* 95* 94* 97* 95* 95* 96*  CO2 24 25 22 25 23 22 24   GLUCOSE 98 136* 97 88 130* 95 98  BUN 31* 44* 54* 35* 44* 52* 29*  CREATININE 3.51* 5.04* 6.03* 4.51* 5.55* 6.17* 4.15*  CALCIUM 7.5* 7.9* 8.0* 7.7* 7.9* 7.8* 7.6*  PHOS 2.4* 3.3 3.6 3.1 4.2 5.0* 3.3   CBC Recent Labs  Lab 11/27/20 1400 11/28/20 0459 12/03/20 0509  WBC 14.3* 14.0* 11.6*  NEUTROABS  --  11.8*  --   HGB 7.5* 7.9* 8.1*  HCT 23.6* 24.6* 26.1*  MCV 92.9 90.8 95.6  PLT 165 145* 269     Medications:    . amiodarone  200 mg Oral Daily  . bismuth subsalicylate  30 mL Oral Q6H  . calcitRIOL  0.5 mcg Oral Daily  . Chlorhexidine Gluconate Cloth  6 each Topical Q0600  . Chlorhexidine Gluconate Cloth  6 each Topical Q0600  . darbepoetin (ARANESP) injection - DIALYSIS  200 mcg Intravenous Q Mon-HD  . famotidine  20 mg Oral Daily  . feeding supplement  237 mL Oral BID BM  . finasteride  5 mg Oral Daily  .  fludrocortisone  0.2 mg Oral Daily  . loperamide  2 mg Oral TID  . metoprolol tartrate  12.5 mg Oral QHS  . midodrine  10 mg Oral TID with meals  . multivitamin  1 tablet Oral QHS  . psyllium  1 packet Oral QHS  . pyridostigmine  60 mg Oral Daily  . simvastatin  20 mg Oral QPM  . sodium chloride flush  3 mL Intravenous Q12H   Elmarie Shiley, MD 12/04/2020, 9:55 AM

## 2020-12-04 NOTE — Progress Notes (Signed)
PT Cancellation Note  Patient Details Name: [name] MRN: [id number] DOB: 03-May-1951   Cancelled Treatment:    Reason Eval/Treat Not Completed: Other (comment). Upon entry into room, pt reports declines therapy due to nausea and BM- nursing notified.    Talbot Grumbling PT, DPT 12/04/20, 2:29 PM [phone]

## 2020-12-04 NOTE — Progress Notes (Signed)
PROGRESS NOTE  [name] XLK:[id number] DOB: 12/11/1950 DOA: 11/02/2020 PCP: Patient, No Pcp Per  Brief History: 70yo male with history of diabetes mellitus, CKD stage V started recently on dialysis Monday Wednesday Friday and still making urine, recent A. fib and was restarted on amiodarone metoprolol and Eliquis was sent fromSNFafter he was noted to have an episode of syncope while getting up to go to hemodialysis.On arrival to the ED his systolic blood pressures were in the 70 mmHg range. He is noted to have some diarrhea, but he has recently completed course of ciprofloxacin and Flagyl by 11/23. He was noted to have nonspecific colitis on pathology after recent colonoscopy on 11/17 and was started on prednisone taper which he continues to take at this time.He was just discharged after admission from 10/12/20 to 11/01/20 when he was treated for syncope from orthostasis as well as colitis as discussed above. He also required 5 units PRBC last admission. Hewas started on Solu-Medrol 60 mg daily 11/19--per GI>>po 40 mg daily--wean by 10 mg per week--started30 mg daily on 11/02/20. He is now finished with steroids   ED Course:Patient had received 250 mL of normal saline fluid bolus with improvement in blood pressure readings. Manual blood pressures confirm systolics in the low 366 range and heart rates fluctuate in the low 100 range. He denies any palpitations or chest pains or shortness of breath. Laboratory data with stable hemoglobin levels noted and he is noted to be hypokalemic with potassium 2.9 and he has a lactic acid level 2.6. Covid testing ordered and pending.  Assessment/Plan: Orthostatic hypotension -continues to be orthostatic despite pyridostigmine, midodrine and florinef -this lead to repeat admission after d/c on 11/01/20 -10/13/20--EchoEF 44-03%KVQQV 1 diastolic dysfunction -addedabdominal binder and knee stockings -decrease metoprolol to once  daily -am cortisol 23.1 -TSH 1.912 -not a candidate for Northera -case discussed with renal, Dr. Libby Maw d/c with stretcher HD until able to tolerate sitting and standing -11/30/20--discussed case with Loleta Books, Dr. Ace Gins additional to offer, agrees with our current treatment -OOB with each shift; Check orthostatic daily--remains orthostatic  Chronic Diarrhea/Colitis -presented with diarrhea and LGIB last admission in Nov 2021 -improving with imodium -initially treated with cip/fl and then 4 weeks of steroid taper -10/15/20, 11/06/20 and12/22/21 GI pathogen panel neg -11/02/20 Cdiff neg -11/05/20 Stool O&P--neg -GI following -10/17/20 colonoscopy-area of moderately congested mucosa with ulceration was found in the sigmoid colon and in the descending colon.Bllood noted in rectum, sigmoid and descending colon -11/21/20 colonoscopy--polyps in asc colon & 2 polyps hepatic flexure, extensive circumferential ulcers in distal sigmoid -11/17/21Biopsies of rectum and sigmoid colon show active nonspecific colitis w/ ulceration c/f medication induced (NSAIDs), ischemia, or stercoral proctitis -12//22/21 TI and colon biopsies--neg for inflammation; +tubular adenmoa -GI suspects ischemic injury -Fecal elastase and qualitative fecal fat analysisordered by GI -11/30/20--discussed case with Loleta Books, Dr. Ace Gins additional to offer, agrees with our current treatment  Anemia of CKD -had rectal bleed last admit requiring 5 units PRBC -11/21/20--received 1 unit this admit -multifactorial including CKD, blood draws, chronic disease -iron studies consistent with ACD  ESRD -Biopsy results from outpatient nephrologist moderate to severe arterial nephrosclerosis with 75% global glomerulosclerosis, focal and segmental glomerular atrophic scarring and diffuse moderate to severe tubulointerstitial scarring --started recently on dialysis MWF --HD on 12/28--unable to remove  fluid --having intradialytic hypotension already on midodrine-->having difficulty removing fluid -discussed with Estanislado Emms to position in sitting position for HD  Recent  paroxysmal A. fib --started on amiodaronelast admission --Eliquis--holdingdue to his rectal bleeding and acute blood loss anemia --Patient understands risks and benefits of holding anticoagulation and he seems to be reluctant to start it again-we will need close follow-up with his cardiologist/PCP. --metoprolol stopped due to orthostasis initially --start metoprolol 12.5 mg bid as pt having more afib RVR episodesfrom last admission>>decrease to once daily  Hyponatremia, --sodiumis low continue to adjust dialysis.  Hyperlipidemia: --Continue statins.  Acute urinary retention- s/p foley placement 12/16. Foley removed 12/23 and he had been voiding. Unfortunately he developed acute urinary retention again on 12/25 and foley was replaced with >900 mL urine relieved. Will leave foley in place for now until he can follow up outpatient with urology.  Intermittent Hematuria/CAUTI -likely due to foley trauma and intermittent movement/UTI -anchor foley -am CBC--Hgb stable -UA>50WBC -urine culture= serratia -continue ceftriaxone  Goals of Care -long discussion with patient's daughter at bedside 11/29/20 Advance care planning, including the explanation and discussion of advance directives was carried out with the patient and family. Code status including explanations of "Full Code" and "DNR" and alternatives were discussed in detail. Discussion of end-of-life issues including but not limited palliative care, hospice care and the concept of hospice, other end-of-life care options, power of attorney for health care decisions, living wills, and physician orders for life-sustaining treatment were also discussed with the patient and family. Total face to face time 36 minutes. -patient wants to continue full  scope of care -12/02/19--confirms full scope of care; Plan to d/c to SNF and stretcher HD facility -12/04/19--[name] appears to have little motivation--refuses to sit up in chair; minimal effort with PT and intermittently refuses PT -ultimate plan is to find HD facility which will accomodate "stetcher dialysis"    Status is: Inpatient  Remains inpatient appropriate because:IV treatments appropriate due to intensity of illness or inability to take PO Remains orthostatic putting him at high risk for re-admission Also unable to remove fluid on HD   Dispo: Patient From: [facility] Planned Disposition: [facility] Expected discharge date: 12/04/2020 Medically stable for discharge: No         Family Communication: daughter updated 12/01/20  significant other updated 12/02/20  Consultants: renal, GI, palliative  Code Status: FULL   DVT Prophylaxis: SCDs   Procedures: As Listed in Progress Note Above  Antibiotics: None     Subjective: Patient complains of nausea.  Denies cp, sob, f/c, headache, abd pain.  Had 2 BMs today without blood  Objective: Vitals:   12/04/20 0502 12/04/20 0800 12/04/20 1227 12/04/20 1328  BP: 126/73 122/61 117/61 123/63  Pulse: 71 64 68 67  Resp: 18   20  Temp: 98.2 F (36.8 C)   98.8 F (37.1 C)  TempSrc:    Oral  SpO2: 98%  98% 99%  Weight:      Height:        Intake/Output Summary (Last 24 hours) at 12/04/2020 1751 Last data filed at 12/04/2020 1325 Gross per 24 hour  Intake 240 ml  Output 454 ml  Net -214 ml   Weight change: 0.2 kg Exam:   General:  Pt is alert, follows commands appropriately, not in acute distress  HEENT: No icterus, No thrush, No neck mass, [place]/AT  Cardiovascular: RRR, S1/S2, no rubs, no gallops  Respiratory: bibasilar rales. No wheeze  Abdomen: Soft/+BS, non tender, non distended, no guarding  Extremities:  No edema, No lymphangitis, No petechiae, No rashes, no synovitis   Data Reviewed:  I have personally reviewed following labs and imaging studies Basic Metabolic Panel: Recent Labs  Lab 11/30/20 0621 12/01/20 0658 12/02/20 0557 12/03/20 0509 12/04/20 0500  NA 128* 131* 126* 129* 131*  K 3.4* 3.0* 3.4* 3.3* 3.2*  CL 94* 97* 95* 95* 96*  CO2 22 25 23 22 24   GLUCOSE 97 88 130* 95 98  BUN 54* 35* 44* 52* 29*  CREATININE 6.03* 4.51* 5.55* 6.17* 4.15*  CALCIUM 8.0* 7.7* 7.9* 7.8* 7.6*  PHOS 3.6 3.1 4.2 5.0* 3.3   Liver Function Tests: Recent Labs  Lab 11/30/20 0621 12/01/20 0658 12/02/20 0557 12/03/20 0509 12/04/20 0500  ALBUMIN 2.2* 2.0* 2.0* 2.0* 2.2*   No results for input(s): LIPASE, AMYLASE in the last 168 hours. No results for input(s): AMMONIA in the last 168 hours. Coagulation Profile: No results for input(s): INR, PROTIME in the last 168 hours. CBC: Recent Labs  Lab 11/28/20 0459 12/03/20 0509  WBC 14.0* 11.6*  NEUTROABS 11.8*  --   HGB 7.9* 8.1*  HCT 24.6* 26.1*  MCV 90.8 95.6  PLT 145* 269   Cardiac Enzymes: No results for input(s): CKTOTAL, CKMB, CKMBINDEX, TROPONINI in the last 168 hours. BNP: Invalid input(s): POCBNP CBG: No results for input(s): GLUCAP in the last 168 hours. HbA1C: No results for input(s): HGBA1C in the last 72 hours. Urine analysis:    Component Value Date/Time   COLORURINE AMBER (A) 12/01/2020 1750   APPEARANCEUR CLOUDY (A) 12/01/2020 1750   LABSPEC 1.008 12/01/2020 1750   PHURINE 6.0 12/01/2020 1750   GLUCOSEU NEGATIVE 12/01/2020 1750   HGBUR LARGE (A) 12/01/2020 1750   BILIRUBINUR NEGATIVE 12/01/2020 1750   KETONESUR NEGATIVE 12/01/2020 1750   PROTEINUR 100 (A) 12/01/2020 1750   NITRITE NEGATIVE 12/01/2020 1750   LEUKOCYTESUR LARGE (A) 12/01/2020 1750   Sepsis Labs: @LABRCNTIP (procalcitonin:4,lacticidven:4) ) Recent Results (from the past 240 hour(s))  Culture, Urine     Status: Abnormal   Collection Time:  12/01/20  5:50 PM   Specimen: Urine, Catheterized  Result Value Ref Range Status   Specimen Description   Final    URINE, CATHETERIZED Performed at [facility], [address] [place], [place] [address]    Special Requests   Final    NONE Performed at [facility], [address], [place], [place] [address]    Culture >=100,000 COLONIES/mL SERRATIA MARCESCENS (A)  Final   Report Status 12/04/2020 FINAL  Final   Organism ID, Bacteria SERRATIA MARCESCENS (A)  Final      Susceptibility   Serratia marcescens - MIC*    CEFAZOLIN >=64 RESISTANT Resistant     CEFEPIME <=0.12 SENSITIVE Sensitive     CEFTRIAXONE 1 SENSITIVE Sensitive     CIPROFLOXACIN 2 INTERMEDIATE Intermediate     GENTAMICIN <=1 SENSITIVE Sensitive     NITROFURANTOIN 256 RESISTANT Resistant     TRIMETH/SULFA <=20 SENSITIVE Sensitive     * >=100,000 COLONIES/mL SERRATIA MARCESCENS     Scheduled Meds: . amiodarone  200 mg Oral Daily  . bismuth subsalicylate  30 mL Oral Q6H  . calcitRIOL  0.5 mcg Oral Daily  . Chlorhexidine Gluconate Cloth  6 each Topical Q0600  . Chlorhexidine Gluconate Cloth  6 each Topical Q0600  . darbepoetin (ARANESP) injection - DIALYSIS  200 mcg Intravenous Q Mon-HD  . famotidine  20 mg Oral Daily  . feeding supplement  237 mL Oral BID BM  . finasteride  5 mg Oral Daily  . fludrocortisone  0.2 mg Oral Daily  .  loperamide  2 mg Oral TID  . metoprolol tartrate  12.5 mg Oral QHS  . midodrine  10 mg Oral TID with meals  . multivitamin  1 tablet Oral QHS  . psyllium  1 packet Oral QHS  . pyridostigmine  60 mg Oral Daily  . simvastatin  20 mg Oral QPM  . sodium chloride flush  3 mL Intravenous Q12H   Continuous Infusions: . sodium chloride    . sodium chloride    . sodium chloride    . cefTRIAXone (ROCEPHIN)  IV 1 g (12/03/20 1817)    Procedures/Studies: MR BRAIN WO CONTRAST  Result Date: 11/16/2020 CLINICAL DATA:  Question deposition disease. Hypotension. Altered mental status. EXAM:  MRI HEAD WITHOUT CONTRAST TECHNIQUE: Multiplanar, multiecho pulse sequences of the brain and surrounding structures were obtained without intravenous contrast. COMPARISON:  Head CT 01/25/2020 FINDINGS: Brain: Diffusion imaging does not show any acute or subacute infarction. No focal abnormality affects brainstem or cerebellum. Cerebral hemispheres show mild age related volume loss without evidence of small-vessel disease or large vessel infarction. No mass lesion, hemorrhage, hydrocephalus or extra-axial collection. Vascular: Major vessels at the base of the brain show flow. Skull and upper cervical spine: Negative Sinuses/Orbits: Clear/normal Other: None IMPRESSION: No acute or reversible finding. Mild age related volume loss. No evidence of small-vessel disease or large vessel infarction. Electronically Signed   By: Nelson Chimes M.D.   On: 11/16/2020 14:01   DG Abd Portable 1V  Result Date: 11/28/2020 CLINICAL DATA:  Abdominal distension abdominal pain EXAM: PORTABLE ABDOMEN - 1 VIEW COMPARISON:  November 07, 2020 CT assessment, abdominal plain film from November 15, 2020 FINDINGS: Decreased distension of bowel loops in the abdomen when compared to the study from November 15, 2020. Mild distension currently of scattered gas-filled loops of small bowel. Suggestion of small amount of proximal rectal gas. No acute skeletal process on limited assessment. Signs of basilar atelectasis. IMPRESSION: Decreased distension of bowel loops in the abdomen when compared to the study from November 15, 2020. Mild distension currently of scattered gas-filled loops of small bowel may reflect mild ileus. Suggestion of small amount of proximal rectal gas. Electronically Signed   By: Zetta Bills M.D.   On: 11/28/2020 16:15   DG Abd Portable 1V  Result Date: 11/15/2020 CLINICAL DATA:  Abdominal distension EXAM: PORTABLE ABDOMEN - 1 VIEW COMPARISON:  10/25/2020 FINDINGS: The subdiaphragmatic region and right flank are  excluded from view. Multiple gas-filled prominent loops of large and small bowel are seen throughout the visualized abdomen in keeping with un underlying ileus. No gross free intraperitoneal gas. Gas and stool is seen within the rectal vault. No organomegaly. No acute bone abnormality. IMPRESSION: Mild ileus. Electronically Signed   By: Fidela Salisbury MD   On: 11/15/2020 04:15   CT Angio Abd/Pel w/ and/or w/o  Result Date: 11/07/2020 CLINICAL DATA:  Evaluate for mesenteric ischemia. End-stage renal disease on dialysis. Bowel wall thickening on exam from 10/12/2020. EXAM: CTA ABDOMEN AND PELVIS WITHOUT AND WITH CONTRAST TECHNIQUE: Multidetector CT imaging of the abdomen and pelvis was performed using the standard protocol during bolus administration of intravenous contrast. Multiplanar reconstructed images and MIPs were obtained and reviewed to evaluate the vascular anatomy. CONTRAST:  63mL OMNIPAQUE IOHEXOL 350 MG/ML SOLN COMPARISON:  10/22/2020 FINDINGS: VASCULAR Aorta: Mild atherosclerotic disease in the abdominal aorta without aneurysm or dissection. Celiac: Celiac trunk is patent without significant stenosis. Focal dilatation of the trunk measures up to 9 mm. Main branch vessels are  patent. SMA: Replaced right hepatic artery. SMA is widely patent without significant stenosis. No evidence for aneurysm or dissection. Renals: Main right renal artery is widely patent without stenosis, aneurysm or dissection. There is small accessory right renal artery. High-grade focal narrowing in the proximal main left renal artery without significant plaque in this area. Findings could be related to a dissection but indeterminate. Small accessory left renal artery is patent. IMA: Patent Inflow: Atherosclerotic disease involving the proximal left common iliac artery without significant stenosis. Common iliac arteries are patent bilaterally. Disease and stenosis involving the right internal iliac artery. Left internal iliac  artery is patent. Bilateral external iliac arteries are widely patent. Proximal Outflow: Proximal femoral arteries are patent bilaterally. Veins: Main portal venous system is patent. Limited evaluation of the superior cavoatrial junction on the arterial phase imaging due to non-opacified blood in this area. IVC and renal veins are patent. Narrowing of the left common iliac vein from the right common iliac artery is a normal anatomic variant. Review of the MIP images confirms the above findings. NON-VASCULAR Lower chest: Small bilateral pleural effusions with compressive atelectasis. Hepatobiliary: High-density material in the gallbladder are suggestive for small stones. The gallbladder is moderately distended without definite inflammatory changes. There is colon anterior to the liver. No discrete liver lesion. No biliary dilatation. Gallbladder distension has minimally changed since the previous examination. Pancreas: Unremarkable. No pancreatic ductal dilatation or surrounding inflammatory changes. Spleen: Normal in size without focal abnormality. Adrenals/Urinary Tract: Normal appearance of the adrenal glands. Both kidneys are small with scattered calcifications. Findings are suggestive for nonobstructive renal calculi. Stomach/Bowel: Normal appearance of the stomach and duodenum. There is a rectal catheter with an inflated balloon. Sigmoid colon is moderately distended with mild wall thickening. However, the sigmoid wall thickening is less impressive on the venous phase imaging. Cecum is distended and contains a large amount of stool. Transverse colon is distended with gas. Lymphatic: No significant lymph node enlargement in the abdomen or pelvis. Reproductive: Stable appearance of the prostate. Other: Diffuse subcutaneous edema. Small amount of pelvic ascites which is new. Trace ascites in the left lower quadrant of the abdomen. Trace ascites in the upper abdomen. Negative for free air. Musculoskeletal: Chronic  disc space narrowing with endplate changes at K0-X3. Stable disc space narrowing at L5-S1. IMPRESSION: VASCULAR 1. Atherosclerotic disease in the abdominal aorta without aneurysm or significant stenosis. Aortic Atherosclerosis (ICD10-I70.0). 2. Main mesenteric arteries are patent without significant stenosis. No evidence to suggest chronic mesenteric ischemia. 3. Multiple renal arteries as described. Focal narrowing the proximal left main renal artery that could be related to atherosclerotic disease or focal dissection in this area. NON-VASCULAR 1. The sigmoid colon has been partially decompressed since 10/22/2020 and placement of the rectal tube. There continues to be gaseous distension of the sigmoid colon and transverse colon. Mild wall thickening in the sigmoid colon is nonspecific but may be related to the partial decompression rather than infectious or inflammatory process. Findings are suggestive for an underlying colonic ileus. 2. Bilateral kidneys are atrophic with bilateral calcifications. Findings are compatible with history of end-stage renal disease. Calcifications could represent nonobstructive renal calculi. 3. Diffuse subcutaneous edema with small amount of ascites, most prominent in the pelvis. 4. Significant disc space disease at L2-L3. 5. Bilateral pleural effusions with compressive atelectasis at the lung bases. 6. Cholelithiasis with moderate gallbladder distension. Electronically Signed   By: Markus Daft M.D.   On: 11/07/2020 17:58    Orson Eva, DO  [facility]  If 7PM-7AM, please contact night-coverage [email or web address] Password [email or web address] 12/04/2020, 5:51 PM   LOS: 32 days

## 2020-12-05 DIAGNOSIS — R140 Abdominal distension (gaseous): Secondary | ICD-10-CM | POA: Diagnosis not present

## 2020-12-05 DIAGNOSIS — Z7189 Other specified counseling: Secondary | ICD-10-CM | POA: Diagnosis not present

## 2020-12-05 DIAGNOSIS — K529 Noninfective gastroenteritis and colitis, unspecified: Secondary | ICD-10-CM | POA: Diagnosis not present

## 2020-12-05 DIAGNOSIS — N186 End stage renal disease: Secondary | ICD-10-CM | POA: Diagnosis not present

## 2020-12-05 MED ORDER — HEPARIN SODIUM (PORCINE) 1000 UNIT/ML DIALYSIS
40.0000 [IU]/kg | INTRAMUSCULAR | Status: DC | PRN
  Filled 2020-12-05 (×2): qty 3

## 2020-12-05 MED ORDER — COLLAGENASE 250 UNIT/GM EX OINT
TOPICAL_OINTMENT | Freq: Every day | CUTANEOUS | Status: DC
  Filled 2020-12-05: qty 30

## 2020-12-05 NOTE — Progress Notes (Signed)
Patient ID: [name], male   DOB: 10/23/1951, 70 y.o.   MRN: [id number] [facility] Progress Note   Assessment/ Plan:   1.  Recurrent syncope with orthostatic hypotension/autonomic dysfunction: Minimal ultrafiltration with dialysis so as not to exacerbate symptoms with ongoing support using midodrine, fludrocortisone and pyridostigmine.  He is also on external compression devices with abdominal binder and compression stockings.  Case management is assisting with transfer to [facility] for stretcher dialysis (apparently a referral will be sent from [facility] as he was an established patient there).  He will be discharged to [facility], SNF at [facility] upon discharge. -recommend liberalizing sodium intake  2. ESRD: Continue with hemodialysis on a Monday/Wednesday/Friday schedule possibly in recliner (that has the option of laying him flat should he get symptomatically hypotensive) with no ultrafiltration.  Process underway for placement to the hemodialysis unit that can accommodate stretcher dialysis.  Vascular surgery unavailable at this campus 4. CKD-MBD: Calcium and phosphorus level currently at goal without phosphorus binder.  Remains on calcitriol for PTH control. 5. Nutrition: With evidence of protein calorie malnutrition likely exacerbated by recent episode of colitis.  Status post ciprofloxacin and metronidazole. 6.  Colitis: Suspected to be infectious and status post treatment with ciprofloxacin and metronidazole.  Diarrhea improving. 7. Hyponatremia: likely related to orthostatic hypotension, on 137Na bath, advised patient to liberalize sodium/solute intake esp for orthostatic hypotension  Subjective:   No acute events, unable to do PT yesterday. No complaints.   Objective:   BP 125/75 (BP Location: Right Arm)   Pulse 67   Temp 98.2 F (36.8 C)   Resp 16   Ht 6\' 2"  (1.88 m)   Wt 65 kg   SpO2 99%   BMI 18.40 kg/m   Physical Exam: Gen: nad,  comfortable, laying in bed CVS: Pulse regular rhythm, normal rate, S1 and S2 normal Resp: Clear to auscultation bilaterally without any rales/rhonchi.   Abd: Soft, flat, nontender, bowel sounds normal Ext: no edema Neuro: speech clear and coherent, alert and oriented, moves all extremities spontaneously Access: Right IJ TDC in place (high venotomy site)  Labs: BMET Recent Labs  Lab 11/29/20 0526 11/30/20 0621 12/01/20 0658 12/02/20 0557 12/03/20 0509 12/04/20 0500  NA 131* 128* 131* 126* 129* 131*  K 3.5 3.4* 3.0* 3.4* 3.3* 3.2*  CL 95* 94* 97* 95* 95* 96*  CO2 25 22 25 23 22 24   GLUCOSE 136* 97 88 130* 95 98  BUN 44* 54* 35* 44* 52* 29*  CREATININE 5.04* 6.03* 4.51* 5.55* 6.17* 4.15*  CALCIUM 7.9* 8.0* 7.7* 7.9* 7.8* 7.6*  PHOS 3.3 3.6 3.1 4.2 5.0* 3.3   CBC Recent Labs  Lab 12/03/20 0509  WBC 11.6*  HGB 8.1*  HCT 26.1*  MCV 95.6  PLT 269     Medications:    . amiodarone  200 mg Oral Daily  . bismuth subsalicylate  30 mL Oral Q6H  . calcitRIOL  0.5 mcg Oral Daily  . Chlorhexidine Gluconate Cloth  6 each Topical Q0600  . Chlorhexidine Gluconate Cloth  6 each Topical Q0600  . darbepoetin (ARANESP) injection - DIALYSIS  200 mcg Intravenous Q Mon-HD  . famotidine  20 mg Oral Daily  . feeding supplement  237 mL Oral BID BM  . finasteride  5 mg Oral Daily  . fludrocortisone  0.2 mg Oral Daily  . loperamide  2 mg Oral TID  . metoprolol tartrate  12.5 mg Oral QHS  . midodrine  10 mg Oral TID with meals  . multivitamin  1 tablet Oral QHS  . pantoprazole  40 mg Oral BID AC  . psyllium  1 packet Oral QHS  . pyridostigmine  60 mg Oral Daily  . simvastatin  20 mg Oral QPM  . sodium chloride flush  3 mL Intravenous Q12H

## 2020-12-05 NOTE — Progress Notes (Signed)
Nutrition Follow-up  DOCUMENTATION CODES:   Not applicable  INTERVENTION:  Continue Ensure Enlive po BID, each supplement provides 350 kcal and 20 grams of protein  Continue Magic cup BID with meals, each supplement provides 290 kcal and 9 grams of protein  Continue 2x protein with breakfast and dinner meals  Liberalize to regular diet   NUTRITION DIAGNOSIS:   Increased nutrient needs related to chronic illness (ESRD on HD) as evidenced by estimated needs. -ongoing  GOAL:   Patient will meet greater than or equal to 90% of their needs -progressing  MONITOR:   Labs,I & O's,Supplement acceptance,PO intake,Weight trends,Skin  REASON FOR ASSESSMENT:   Consult Assessment of nutrition requirement/status  ASSESSMENT:   70 year old male with history significant for DM2, CKD stage V recently started on HD, atrial fibrillation/flutter, recurrent orthostatic hypotension, HLD, who was recently hospitalized and discharged to SNF on 12/2 for colitis with associated adynamic ileus and acute blood loss anemia presented from facility after an episode of syncope while getting up to go to hemodialysis.  Patient sleeping soundly this morning at follow-up. Meal intake has been fair, consuming 25-75% (52% average of the last 7 documented meals 12/30-1/03). He has accepted 6/14 Ensure since last follow-up. Nephrology encouraging pt to liberalize sodium intake. Currently receiving HH diet, spoke with MD via secure chat, okay to liberalize to regular diet.   Weights have remained stable in the last 7 days, weights down ~13 lbs this admission I/Os: -16,911.6 ml since admit UOP: 400 ml x 24 hrs  Plans for discharge to [facility], continue HD MWF, Case management assisting with transfer to [facility] from [facility] for stretcher dialysis.  Medications reviewed and include: Calcitriol, Pepcid, Imodium, Renavit  Labs: Na 131 (L), K 3.2 (L), BUN 29 (H), Cr 4.15 (H), P 3.3 (WNL), WBC 11.6  (H)  Diet Order:   Diet Order            Diet Heart Room service appropriate? Yes; Fluid consistency: Thin  Diet effective now                 EDUCATION NEEDS:   Not appropriate for education at this time  Skin:  Skin Integrity Issues:: Stage II Stage II: sacrum (11/13)  Last BM:  1/5 - type 6  Height:   Ht Readings from Last 1 Encounters:  11/02/20 6\' 2"  (1.88 m)    Weight:   Wt Readings from Last 1 Encounters:  12/05/20 65 kg    BMI:  Body mass index is 18.4 kg/m.  Estimated Nutritional Needs:   Kcal:  2127-2410  Protein:  115-130  Fluid:  UOP + 1000 ml   Lajuan Lines, RD, LDN Clinical Nutrition After Hours/Weekend Pager # in [place]

## 2020-12-05 NOTE — Progress Notes (Signed)
Palliative: Thorough chart review completed.  Mr. Blatz seems more motivated to attempt chair hemodialysis per HD RN.  Currently, goals are set for short-term rehab, full scope/full code. PMT to shadow for declines. Conference with attending and HD RN related to patient condition, needs, goals of care.  Plan: Continue to treat the treatable.  Continue attempting chair HD.  Seeking short-term rehab.  No charge Quinn Axe, NP Palliative medicine team Team phone [phone] Greater than 50% of this time was spent counseling and coordinating care related to the above assessment and plan.

## 2020-12-05 NOTE — Procedures (Signed)
   HEMODIALYSIS TREATMENT NOTE:  Activase was instilled this morning to optimize cath flow this afternoon.  At that time, pt was agreeable to attempt HD in the recliner later today.  Of note, has required Activase for several treatments now.  I arrived to his room at 1640 to rearrange the furniture to accommodate a recliner and all HD equipment.  Mr. Strahm was heaving a small amount of yellow emesis.  Primary RN Alwyn Ren promptly administered Zofran and, with the assistance of Trilby Drummer LPN, helped clean the pt, apply CAP thigh-high stockings (abdominal binder was in place), and assist with transfer from the bed to the recliner.  Mr. Chrostowski was able to stand with his walker just long enough to be quickly cleaned (after a small watery stool) before he was assisted to the recliner.  He is unable to bear weight on his right foot and required maximum assistance for this move.  Vitals during this activity: Lying in bed prior to transfer: 140/83 p78 Sitting on edge of bed: 111/65 p99 Standing:  Unable to capture.  "This has to be quick" Immediately after sitting in recliner (upright): 99/60 p123 After being reclined in recliner: 155/76 p82  HD was initiated at 1720 with no net UF goal (UF rate 180cc/h - just enough to offset NS given at beginning and end of tx).  Starting BP was 146/74 p76.  Within 40 minutes, BP was down to 103/64 p87 (asymptomatic).  UF was stopped at this time and, even though the rate was minimal, BP improved to 116/62 p84.  Unfortunately, the catheter was not able to tolerate prescribed flow despite the 5-hour Activase dwell and various troubleshooting measures, including line reversal.  Max Qb 300.  Pt is again asking about AVF creation here at [facility] during this admission. Vein mapping has been done.  (Dr. Donnetta Hutching has performed at least 2 at [facility], that I know of).  3 hour session completed.  SBPs ranged from 103 to 140mmHg.  All blood was returned.  VS at end of  session: Lying in recliner, after blood return: 139/75 p105 Sitting in recliner: 137/73 p114 Sitting in recliner after 5 min: 128/72 p123 Sitting in recliner after 10 min:  112/65 p121 No complaints of lightheadedness.  Wants to try OOB to recliner tomorrow, possibly for a meal.  Considerations for outpatient dialysis:  Cannot tolerate ANY ultrafiltration.  Hoyer lift would be needed for transfer to a recliner - outpatient HD staff would not provide the max assistance needed for this activity.  Although he, today, demonstrated the ability to lie reclined during treatment, he likely would NOT tolerate sitting upright during transport to/from clinic and while waiting in the clinic lobby before and after session.  Might need catheter exchange v. TPA infusion.  We could start with a portable CXR to check for a kink?  Agree with need for stretcher dialysis for now.   Rockwell [place], RN

## 2020-12-05 NOTE — TOC Progression Note (Signed)
Transition of Care [facility]) - Progression Note    Patient Details  Name: [name] MRN: [id number] Date of Birth: [birth year]  [facility] CM/SW Contact  Shade Flood, LCSW Phone Number: 12/05/2020, 2:36 PM  Clinical Narrative:     TOC following for dc planning. Spoke with Joy at [facility] regarding need for clinical to be faxed to [facility]. Per Caryl Asp, she will follow up on this.   This LCSW also faxed requested hospital clinical to [facility] today.   Anticipating dc to SNF once stretcher HD arranged. Will follow up tomorrow.  Expected Discharge Plan: Skilled Nursing Facility Barriers to Discharge: Continued Medical Work up  Expected Discharge Plan and Services Expected Discharge Plan: [place]                                               Social Determinants of Health (SDOH) Interventions    Readmission Risk Interventions No flowsheet data found.

## 2020-12-05 NOTE — Consult Note (Signed)
[facility] Nurse Consult Note: Reason for Consult:Worswening pressure injury. Pressure wound is now full thickness with nonviable tissue in center. Assistance for this consultation is provided by CNS J. Buckner. Wound type:Pressure Pressure Injury POA: Yes Measurement:5cm x 5cm total area with 3.5cm x 5cm black eschar in center Wound bed:As described Drainage (amount, consistency, odor) small serous Periwound:as described above. Patient is incontinence of stool. Dressing procedure/placement/frequency: I have implemented a mattress replacement with low air loss feature, turning and repositioning is in place, a pressure redistribution chair pad is provided for times when patient is OOB to chair. Topical care will be with collagenase (Santyl) and enzymatic debriding agent.  Recommendation:  Recommend surgical consult to determine if conservative bedside wound debridement is indicated.  If you agree, please Order/arrange consult  Secure chat sent to Dr. Wynetta Emery to obtain a photo on rounds. He is not able to do that today.  Will request a photo from oncoming provider or provider taking care of patient tomorrow for photodocumentation of wound in EHR supporting description above of worsening pressure injury..  [place] nursing team will not follow, but will remain available to this patient, the nursing and medical teams.  Please re-consult if needed. Thanks, Maudie Flakes, MSN, RN, [place], Arther Abbott  Pager# [phone]

## 2020-12-05 NOTE — Progress Notes (Signed)
PROGRESS NOTE  [name] CLE:[id number] DOB: 01/28/1951 DOA: 11/02/2020 PCP: Patient, No Pcp Per  Brief History: 70yo male with history of diabetes mellitus, CKD stage V started recently on dialysis Monday Wednesday Friday and still making urine, recent A. fib and was restarted on amiodarone metoprolol and Eliquis was sent fromSNFafter he was noted to have an episode of syncope while getting up to go to hemodialysis.On arrival to the ED his systolic blood pressures were in the 70 mmHg range. He is noted to have some diarrhea, but he has recently completed course of ciprofloxacin and Flagyl by 11/23. He was noted to have nonspecific colitis on pathology after recent colonoscopy on 11/17 and was started on prednisone taper which he continues to take at this time.He was just discharged after admission from 10/12/20 to 11/01/20 when he was treated for syncope from orthostasis as well as colitis as discussed above. He also required 5 units PRBC last admission. Hewas started on Solu-Medrol 60 mg daily 11/19--per GI>>po 40 mg daily--wean by 10 mg per week--started30 mg daily on 11/02/20. He is now finished with steroids   ED Course:Patient had received 250 mL of normal saline fluid bolus with improvement in blood pressure readings. Manual blood pressures confirm systolics in the low 944 range and heart rates fluctuate in the low 100 range. He denies any palpitations or chest pains or shortness of breath. Laboratory data with stable hemoglobin levels noted and he is noted to be hypokalemic with potassium 2.9 and he has a lactic acid level 2.6.   Assessment/Plan: Orthostatic hypotension -continues to be orthostatic despite pyridostigmine, midodrine and florinef -this lead to repeat admission after d/c on 11/01/20 -10/13/20--EchoEF 96-75%FFMBW 1 diastolic dysfunction -addedabdominal binder and knee stockings -decrease metoprolol to once daily -am cortisol 23.1 -TSH  1.912 -not a candidate for Northera -case discussed with renal, Dr. Libby Maw d/c with stretcher HD until able to tolerate sitting and standing -11/30/20--discussed case with Loleta Books, Dr. Ace Gins additional to offer, agrees with our current treatment -OOB with each shift; Check orthostatic daily--remains orthostatic  Chronic Diarrhea/Colitis -presented with diarrhea and LGIB last admission in Nov 2021 -improving with imodium -initially treated with cip/fl and then 4 weeks of steroid taper -10/15/20, 11/06/20 and12/22/21 GI pathogen panel neg -11/02/20 Cdiff neg -11/05/20 Stool O&P--neg -GI following -10/17/20 colonoscopy-area of moderately congested mucosa with ulceration was found in the sigmoid colon and in the descending colon.Bllood noted in rectum, sigmoid and descending colon -11/21/20 colonoscopy--polyps in asc colon & 2 polyps hepatic flexure, extensive circumferential ulcers in distal sigmoid -11/17/21Biopsies of rectum and sigmoid colon show active nonspecific colitis w/ ulceration c/f medication induced (NSAIDs), ischemia, or stercoral proctitis -12//22/21 TI and colon biopsies--neg for inflammation; +tubular adenmoa -GI suspects ischemic injury -Fecal elastase and qualitative fecal fat analysisordered by GI -11/30/20--discussed case with Loleta Books, Dr. Ace Gins additional to offer, agrees with our current treatment  Anemia of CKD -had rectal bleed last admit requiring 5 units PRBC -11/21/20--received 1 unit this admit -multifactorial including CKD, blood draws, chronic disease -iron studies consistent with ACD  ESRD -Biopsy results from outpatient nephrologist moderate to severe arterial nephrosclerosis with 75% global glomerulosclerosis, focal and segmental glomerular atrophic scarring and diffuse moderate to severe tubulointerstitial scarring --started recently on dialysis MWF --HD on 12/28--unable to remove fluid --having intradialytic  hypotension already on midodrine-->having difficulty removing fluid -discussed with Estanislado Emms to position in sitting position for HD  Recent paroxysmal A. fib --started  on amiodaronelast admission --Eliquis--holdingdue to his rectal bleeding and acute blood loss anemia --Patient understands risks and benefits of holding anticoagulation and he seems to be reluctant to start it again-we will need close follow-up with his cardiologist/PCP. --metoprolol stopped due to orthostasis initially --start metoprolol 12.5 mg bid as pt having more afib RVR episodesfrom last admission>>decrease to once daily  Hyponatremia, --sodiumis low continue to adjust dialysis.  Hyperlipidemia: --Continue statins.  Acute urinary retention- s/p foley placement 12/16. Foley removed 12/23 and he had been voiding. Unfortunately he developed acute urinary retention again on 12/25 and foley was replaced with >900 mL urine relieved. Will leave foley in place for now until he can follow up outpatient with urology.  Intermittent Hematuria/CAUTI -likely due to foley trauma and intermittent movement/UTI -anchor foley -am CBC--Hgb stable -UA>50WBC -urine culture= serratia -continue ceftriaxone  Goals of Care -long discussion with patient's daughter at bedside 11/29/20 Advance care planning, including the explanation and discussion of advance directives was carried out with the patient and family. Code status including explanations of "Full Code" and "DNR" and alternatives were discussed in detail. Discussion of end-of-life issues including but not limited palliative care, hospice care and the concept of hospice, other end-of-life care options, power of attorney for health care decisions, living wills, and physician orders for life-sustaining treatment were also discussed with the patient and family. Total face to face time 36 minutes. -patient wants to continue full scope of  care -12/02/19--confirms full scope of care; Plan to d/c to SNF and stretcher HD facility -12/04/19--[name] appears to have little motivation--refuses to sit up in chair; minimal effort with PT and intermittently refuses PT -ultimate plan is to find HD facility which will accomodate "stetcher dialysis"  Status is: Inpatient  Remains inpatient appropriate because:IV treatments appropriate due to intensity of illness or inability to take PO Remains orthostatic putting him at high risk for re-admission Also unable to remove fluid on HD  Dispo: Patient From: [place] Planned Disposition: [place] Expected discharge date: 12/04/2020 Medically stable for discharge: No  Family Communication: daughter updated 12/01/20  significant other updated 12/02/20  Consultants: renal, GI, palliative  Code Status: FULL   DVT Prophylaxis: SCDs   Procedures: As Listed in Progress Note Above  Antibiotics: None  Subjective:  Patient complains of nausea.  Denies cp, sob, f/c, headache, abd pain.  Had 2 BMs today without blood  Objective: Vitals:   12/05/20 0500 12/05/20 0522 12/05/20 0808 12/05/20 1203  BP:  120/73 125/75 (!) 144/72  Pulse:  66 67 69  Resp:  18 16 17   Temp:  98.2 F (36.8 C)    TempSrc:      SpO2:  99% 99% 100%  Weight: 65 kg     Height:        Intake/Output Summary (Last 24 hours) at 12/05/2020 1409 Last data filed at 12/05/2020 1204 Gross per 24 hour  Intake 800 ml  Output 400 ml  Net 400 ml   Weight change: -0.1 kg Exam:   General:  Pt is alert, follows commands appropriately, not in acute distress  HEENT: No icterus, No thrush, No neck mass, [place]/AT  Cardiovascular: RRR, S1/S2, no rubs, no gallops  Respiratory: bibasilar rales. No wheeze  Abdomen: Soft/+BS, non tender, non distended, no guarding  Extremities: No edema, No lymphangitis, No petechiae, No rashes, no  synovitis  Data Reviewed: I have personally reviewed following labs and imaging studies Basic Metabolic Panel: Recent Labs  Lab 11/30/20 [phone]  12/01/20 0658 12/02/20 0557 12/03/20 0509 12/04/20 0500  NA 128* 131* 126* 129* 131*  K 3.4* 3.0* 3.4* 3.3* 3.2*  CL 94* 97* 95* 95* 96*  CO2 22 25 23 22 24   GLUCOSE 97 88 130* 95 98  BUN 54* 35* 44* 52* 29*  CREATININE 6.03* 4.51* 5.55* 6.17* 4.15*  CALCIUM 8.0* 7.7* 7.9* 7.8* 7.6*  PHOS 3.6 3.1 4.2 5.0* 3.3   Liver Function Tests: Recent Labs  Lab 11/30/20 0621 12/01/20 0658 12/02/20 0557 12/03/20 0509 12/04/20 0500  ALBUMIN 2.2* 2.0* 2.0* 2.0* 2.2*   No results for input(s): LIPASE, AMYLASE in the last 168 hours. No results for input(s): AMMONIA in the last 168 hours. Coagulation Profile: No results for input(s): INR, PROTIME in the last 168 hours. CBC: Recent Labs  Lab 12/03/20 0509  WBC 11.6*  HGB 8.1*  HCT 26.1*  MCV 95.6  PLT 269   Cardiac Enzymes: No results for input(s): CKTOTAL, CKMB, CKMBINDEX, TROPONINI in the last 168 hours. BNP: Invalid input(s): POCBNP CBG: No results for input(s): GLUCAP in the last 168 hours. HbA1C: No results for input(s): HGBA1C in the last 72 hours. Urine analysis:    Component Value Date/Time   COLORURINE AMBER (A) 12/01/2020 1750   APPEARANCEUR CLOUDY (A) 12/01/2020 1750   LABSPEC 1.008 12/01/2020 1750   PHURINE 6.0 12/01/2020 1750   GLUCOSEU NEGATIVE 12/01/2020 1750   HGBUR LARGE (A) 12/01/2020 1750   BILIRUBINUR NEGATIVE 12/01/2020 1750   KETONESUR NEGATIVE 12/01/2020 1750   PROTEINUR 100 (A) 12/01/2020 1750   NITRITE NEGATIVE 12/01/2020 1750   LEUKOCYTESUR LARGE (A) 12/01/2020 1750    Recent Results (from the past 240 hour(s))  Culture, Urine     Status: Abnormal   Collection Time: 12/01/20  5:50 PM   Specimen: Urine, Catheterized  Result Value Ref Range Status   Specimen Description   Final    URINE, CATHETERIZED Performed at [facility], [address] [place], [place] [address]    Special Requests   Final    NONE Performed at [facility], [address] [place], [place] [address]    Culture >=100,000 COLONIES/mL SERRATIA MARCESCENS (A)  Final   Report Status 12/04/2020 FINAL  Final   Organism ID, Bacteria SERRATIA MARCESCENS (A)  Final      Susceptibility   Serratia marcescens - MIC*    CEFAZOLIN >=64 RESISTANT Resistant     CEFEPIME <=0.12 SENSITIVE Sensitive     CEFTRIAXONE 1 SENSITIVE Sensitive     CIPROFLOXACIN 2 INTERMEDIATE Intermediate     GENTAMICIN <=1 SENSITIVE Sensitive     NITROFURANTOIN 256 RESISTANT Resistant     TRIMETH/SULFA <=20 SENSITIVE Sensitive     * >=100,000 COLONIES/mL SERRATIA MARCESCENS     Scheduled Meds: . amiodarone  200 mg Oral Daily  . bismuth subsalicylate  30 mL Oral Q6H  . calcitRIOL  0.5 mcg Oral Daily  . Chlorhexidine Gluconate Cloth  6 each Topical Q0600  . Chlorhexidine Gluconate Cloth  6 each Topical Q0600  . collagenase   Topical Daily  . darbepoetin (ARANESP) injection - DIALYSIS  200 mcg Intravenous Q Mon-HD  . famotidine  20 mg Oral Daily  . feeding supplement  237 mL Oral BID BM  . finasteride  5 mg Oral Daily  . fludrocortisone  0.2 mg Oral Daily  . loperamide  2 mg Oral TID  . metoprolol tartrate  12.5 mg Oral QHS  . midodrine  10 mg Oral TID with meals  .  multivitamin  1 tablet Oral QHS  . pantoprazole  40 mg Oral BID AC  . psyllium  1 packet Oral QHS  . pyridostigmine  60 mg Oral Daily  . simvastatin  20 mg Oral QPM  . sodium chloride flush  3 mL Intravenous Q12H   Continuous Infusions: . sodium chloride    . sodium chloride    . sodium chloride    . cefTRIAXone (ROCEPHIN)  IV 1 g (12/04/20 1902)    Procedures/Studies: MR BRAIN WO CONTRAST  Result Date: 11/16/2020 CLINICAL DATA:  Question deposition disease. Hypotension. Altered mental status. EXAM: MRI HEAD WITHOUT CONTRAST TECHNIQUE: Multiplanar, multiecho pulse sequences of the brain and surrounding structures  were obtained without intravenous contrast. COMPARISON:  Head CT 01/25/2020 FINDINGS: Brain: Diffusion imaging does not show any acute or subacute infarction. No focal abnormality affects brainstem or cerebellum. Cerebral hemispheres show mild age related volume loss without evidence of small-vessel disease or large vessel infarction. No mass lesion, hemorrhage, hydrocephalus or extra-axial collection. Vascular: Major vessels at the base of the brain show flow. Skull and upper cervical spine: Negative Sinuses/Orbits: Clear/normal Other: None IMPRESSION: No acute or reversible finding. Mild age related volume loss. No evidence of small-vessel disease or large vessel infarction. Electronically Signed   By: Nelson Chimes M.D.   On: 11/16/2020 14:01   DG Abd Portable 1V  Result Date: 11/28/2020 CLINICAL DATA:  Abdominal distension abdominal pain EXAM: PORTABLE ABDOMEN - 1 VIEW COMPARISON:  November 07, 2020 CT assessment, abdominal plain film from November 15, 2020 FINDINGS: Decreased distension of bowel loops in the abdomen when compared to the study from November 15, 2020. Mild distension currently of scattered gas-filled loops of small bowel. Suggestion of small amount of proximal rectal gas. No acute skeletal process on limited assessment. Signs of basilar atelectasis. IMPRESSION: Decreased distension of bowel loops in the abdomen when compared to the study from November 15, 2020. Mild distension currently of scattered gas-filled loops of small bowel may reflect mild ileus. Suggestion of small amount of proximal rectal gas. Electronically Signed   By: Zetta Bills M.D.   On: 11/28/2020 16:15   DG Abd Portable 1V  Result Date: 11/15/2020 CLINICAL DATA:  Abdominal distension EXAM: PORTABLE ABDOMEN - 1 VIEW COMPARISON:  10/25/2020 FINDINGS: The subdiaphragmatic region and right flank are excluded from view. Multiple gas-filled prominent loops of large and small bowel are seen throughout the visualized abdomen  in keeping with un underlying ileus. No gross free intraperitoneal gas. Gas and stool is seen within the rectal vault. No organomegaly. No acute bone abnormality. IMPRESSION: Mild ileus. Electronically Signed   By: Fidela Salisbury MD   On: 11/15/2020 04:15   CT Angio Abd/Pel w/ and/or w/o  Result Date: 11/07/2020 CLINICAL DATA:  Evaluate for mesenteric ischemia. End-stage renal disease on dialysis. Bowel wall thickening on exam from 10/12/2020. EXAM: CTA ABDOMEN AND PELVIS WITHOUT AND WITH CONTRAST TECHNIQUE: Multidetector CT imaging of the abdomen and pelvis was performed using the standard protocol during bolus administration of intravenous contrast. Multiplanar reconstructed images and MIPs were obtained and reviewed to evaluate the vascular anatomy. CONTRAST:  54mL OMNIPAQUE IOHEXOL 350 MG/ML SOLN COMPARISON:  10/22/2020 FINDINGS: VASCULAR Aorta: Mild atherosclerotic disease in the abdominal aorta without aneurysm or dissection. Celiac: Celiac trunk is patent without significant stenosis. Focal dilatation of the trunk measures up to 9 mm. Main branch vessels are patent. SMA: Replaced right hepatic artery. SMA is widely patent without significant stenosis. No evidence for aneurysm or  dissection. Renals: Main right renal artery is widely patent without stenosis, aneurysm or dissection. There is small accessory right renal artery. High-grade focal narrowing in the proximal main left renal artery without significant plaque in this area. Findings could be related to a dissection but indeterminate. Small accessory left renal artery is patent. IMA: Patent Inflow: Atherosclerotic disease involving the proximal left common iliac artery without significant stenosis. Common iliac arteries are patent bilaterally. Disease and stenosis involving the right internal iliac artery. Left internal iliac artery is patent. Bilateral external iliac arteries are widely patent. Proximal Outflow: Proximal femoral arteries are patent  bilaterally. Veins: Main portal venous system is patent. Limited evaluation of the superior cavoatrial junction on the arterial phase imaging due to non-opacified blood in this area. IVC and renal veins are patent. Narrowing of the left common iliac vein from the right common iliac artery is a normal anatomic variant. Review of the MIP images confirms the above findings. NON-VASCULAR Lower chest: Small bilateral pleural effusions with compressive atelectasis. Hepatobiliary: High-density material in the gallbladder are suggestive for small stones. The gallbladder is moderately distended without definite inflammatory changes. There is colon anterior to the liver. No discrete liver lesion. No biliary dilatation. Gallbladder distension has minimally changed since the previous examination. Pancreas: Unremarkable. No pancreatic ductal dilatation or surrounding inflammatory changes. Spleen: Normal in size without focal abnormality. Adrenals/Urinary Tract: Normal appearance of the adrenal glands. Both kidneys are small with scattered calcifications. Findings are suggestive for nonobstructive renal calculi. Stomach/Bowel: Normal appearance of the stomach and duodenum. There is a rectal catheter with an inflated balloon. Sigmoid colon is moderately distended with mild wall thickening. However, the sigmoid wall thickening is less impressive on the venous phase imaging. Cecum is distended and contains a large amount of stool. Transverse colon is distended with gas. Lymphatic: No significant lymph node enlargement in the abdomen or pelvis. Reproductive: Stable appearance of the prostate. Other: Diffuse subcutaneous edema. Small amount of pelvic ascites which is new. Trace ascites in the left lower quadrant of the abdomen. Trace ascites in the upper abdomen. Negative for free air. Musculoskeletal: Chronic disc space narrowing with endplate changes at T2-I7. Stable disc space narrowing at L5-S1. IMPRESSION: VASCULAR 1.  Atherosclerotic disease in the abdominal aorta without aneurysm or significant stenosis. Aortic Atherosclerosis (ICD10-I70.0). 2. Main mesenteric arteries are patent without significant stenosis. No evidence to suggest chronic mesenteric ischemia. 3. Multiple renal arteries as described. Focal narrowing the proximal left main renal artery that could be related to atherosclerotic disease or focal dissection in this area. NON-VASCULAR 1. The sigmoid colon has been partially decompressed since 10/22/2020 and placement of the rectal tube. There continues to be gaseous distension of the sigmoid colon and transverse colon. Mild wall thickening in the sigmoid colon is nonspecific but may be related to the partial decompression rather than infectious or inflammatory process. Findings are suggestive for an underlying colonic ileus. 2. Bilateral kidneys are atrophic with bilateral calcifications. Findings are compatible with history of end-stage renal disease. Calcifications could represent nonobstructive renal calculi. 3. Diffuse subcutaneous edema with small amount of ascites, most prominent in the pelvis. 4. Significant disc space disease at L2-L3. 5. Bilateral pleural effusions with compressive atelectasis at the lung bases. 6. Cholelithiasis with moderate gallbladder distension. Electronically Signed   By: Markus Daft M.D.   On: 11/07/2020 17:58    Clanford Wynetta Emery, MD  How to contact the [facility] Attending or Consulting provider [place] or covering provider during after hours [place],  for this patient?  1. Check the care team in [facility] and look for a) attending/consulting [facility] provider listed and b) the [facility] team listed 2. Log into [email or web address] and use 's universal password to access. If you do not have the password, please contact the hospital operator. 3. Locate the [facility] provider you are looking for under [facility] and page to a number that you can be directly reached. 4. If you still have difficulty  reaching the provider, please page the [facility] (Director on Call) for the Hospitalists listed on amion for assistance.  12/05/2020, 2:09 PM   LOS: 33 days

## 2020-12-06 ENCOUNTER — Inpatient Hospital Stay (HOSPITAL_COMMUNITY): Payer: Medicare Other

## 2020-12-06 DIAGNOSIS — T829XXA Unspecified complication of cardiac and vascular prosthetic device, implant and graft, initial encounter: Secondary | ICD-10-CM

## 2020-12-06 DIAGNOSIS — N186 End stage renal disease: Secondary | ICD-10-CM | POA: Diagnosis not present

## 2020-12-06 DIAGNOSIS — I951 Orthostatic hypotension: Secondary | ICD-10-CM | POA: Diagnosis not present

## 2020-12-06 DIAGNOSIS — Z992 Dependence on renal dialysis: Secondary | ICD-10-CM | POA: Diagnosis not present

## 2020-12-06 DIAGNOSIS — R140 Abdominal distension (gaseous): Secondary | ICD-10-CM | POA: Diagnosis not present

## 2020-12-06 LAB — FECAL FAT, QUALITATIVE
Fat Qual Neutral, Stl: NORMAL
Fat Qual Total, Stl: NORMAL

## 2020-12-06 MED ORDER — CHLORHEXIDINE GLUCONATE CLOTH 2 % EX PADS
6.0000 | MEDICATED_PAD | Freq: Once | CUTANEOUS | Status: AC
  Administered 2020-12-06: 6 via TOPICAL

## 2020-12-06 MED ORDER — CEFAZOLIN SODIUM-DEXTROSE 2-4 GM/100ML-% IV SOLN
2.0000 g | INTRAVENOUS | Status: AC
  Administered 2020-12-07: 2 g via INTRAVENOUS
  Filled 2020-12-06: qty 100

## 2020-12-06 NOTE — H&P (View-Only) (Signed)
[place]  Reason for Consult: Malfunctioning tunneled dialysis catheter  Referring Physician:  Dr.Singh  Chief Complaint    Loss of Consciousness      HPI: [name] is a 70 y.o. male with DM, A fib on Eliquis previously, ESRD on MWF Dialysis with a tunneled internal jugular dialysis catheter placed in October in [place].  He has had recent colitis and was treated with antibiotics and steroids.  He was admitted with orthostatic hypotension and anemia. His eliquis has been held due to rectal bleeding and anemia.  He has attempted at dialysis that has poor flows and TPA has not helped. CXR demonstrates no kinks. The nephrology team suspects a fibrin sheath or issue with the distal catheter.   Past Medical History:  Diagnosis Date  . Diabetes mellitus, type 2 ([place])   . ESRD on hemodialysis ([place])   . GI bleed   . Orthostatic hypotension   . PAF (paroxysmal atrial fibrillation) ([place])     Past Surgical History:  Procedure Laterality Date  . BIOPSY  10/17/2020   Procedure: BIOPSY;  Surgeon: Rogene Houston, MD;  [facility];  Service: Endoscopy;;  ascending and descending  . BIOPSY  11/21/2020   Procedure: BIOPSY;  Surgeon: Rogene Houston, MD;  [facility];  Service: Endoscopy;;  . COLONOSCOPY WITH PROPOFOL N/A 10/17/2020   Procedure: COLONOSCOPY WITH PROPOFOL;  Surgeon: Rogene Houston, MD;  [facility];  Service: Endoscopy;  Laterality: N/A;  . COLONOSCOPY WITH PROPOFOL N/A 11/21/2020   Procedure: COLONOSCOPY WITH PROPOFOL;  Surgeon: Rogene Houston, MD;  [facility];  Service: Endoscopy;  Laterality: N/A;  . EXTERNAL FIXATION LEG Right 06/08/2018   Procedure: EXTERNAL FIXATION COMMINUTED FIBULA FRACTURE;  Surgeon: Netta Cedars, MD;  Location: [place];  Service: Orthopedics;  Laterality: Right;  . EXTERNAL FIXATION REMOVAL Right 06/11/2018   Procedure: REMOVAL EXTERNAL FIXATION LEG;  Surgeon: Shona Needles, MD;  Location: [place];  Service: Orthopedics;  Laterality: Right;  . I & D EXTREMITY Right 06/08/2018   Procedure: IRRIGATION AND DEBRIDEMENT OPEN TIBIA FRACTURE;  Surgeon: Netta Cedars, MD;  Location: [place];  Service: Orthopedics;  Laterality: Right;  . I & D EXTREMITY Right 06/11/2018   Procedure: IRRIGATION AND DEBRIDEMENT EXTREMITY;  Surgeon: Shona Needles, MD;  Location: [place];  Service: Orthopedics;  Laterality: Right;  . NOSE SURGERY    . ORIF ANKLE FRACTURE Right 06/08/2018   Procedure: OPEN REDUCTION INTERNAL FIXATION (ORIF) MEDIAL MALLEOLUS;  Surgeon: Netta Cedars, MD;  Location: [place];  Service: Orthopedics;  Laterality: Right;  . ORIF ANKLE FRACTURE Right 06/11/2018   Procedure: OPEN REDUCTION INTERNAL FIXATION (ORIF) ANKLE FRACTURE;  Surgeon: Shona Needles, MD;  Location: [place];  Service: Orthopedics;  Laterality: Right;  . POLYPECTOMY  11/21/2020   Procedure: POLYPECTOMY;  Surgeon: Rogene Houston, MD;  [facility];  Service: Endoscopy;;  . TIBIA DEBRIDEMENT Right 06/08/2018    Family History  Problem Relation Age of Onset  . Hypertension Father     Social History   Tobacco Use  . Smoking status: Never Smoker  . Smokeless tobacco: Never Used  Vaping Use  . Vaping Use: Never used  Substance Use Topics  . Alcohol use: Yes    Comment: occasional  . Drug use: Never    Medications:  I have reviewed the patient's current medications. Prior to Admission:  Medications Prior to Admission  Medication  Sig Dispense Refill Last Dose  . amiodarone (PACERONE) 200 MG tablet Take 200 mg by mouth daily.   11/01/2020 at 0800  . metoprolol tartrate (LOPRESSOR) 25 MG tablet Take 0.5 tablets (12.5 mg total) by mouth 2 (two) times daily. 30 tablet 0 11/01/2020 at 0800  . midodrine (PROAMATINE) 10 MG tablet Take 1 tablet (10 mg total) by mouth with breakfast, with lunch, and with evening meal. 90 tablet 0 11/01/2020 at 1130  . ondansetron (ZOFRAN) 4 MG tablet Take 4 mg by  mouth every 6 (six) hours as needed for nausea.    10/31/2020  . pantoprazole (PROTONIX) 40 MG tablet Take 40 mg by mouth daily.   11/02/2020 at 0600  . promethazine (PHENERGAN) 25 MG/ML injection Inject 25 mg into the muscle every 4 (four) hours as needed for nausea or vomiting.   11/01/2020 at Unknown time  . simvastatin (ZOCOR) 20 MG tablet Take 20 mg by mouth every evening.  0 10/31/2020  . [EXPIRED] predniSONE (DELTASONE) 10 MG tablet Take 3 tablets (30 mg total) by mouth daily with breakfast for 7 days. Then wean by 10mg  weekly. (Patient taking differently: Take 30 mg by mouth daily with breakfast. For 7 days) 21 tablet 0    Scheduled: . amiodarone  200 mg Oral Daily  . calcitRIOL  0.5 mcg Oral Daily  . Chlorhexidine Gluconate Cloth  6 each Topical Q0600  . Chlorhexidine Gluconate Cloth  6 each Topical Q0600  . Chlorhexidine Gluconate Cloth  6 each Topical Once   And  . Chlorhexidine Gluconate Cloth  6 each Topical Once  . collagenase   Topical Daily  . darbepoetin (ARANESP) injection - DIALYSIS  200 mcg Intravenous Q Mon-HD  . famotidine  20 mg Oral Daily  . feeding supplement  237 mL Oral BID BM  . finasteride  5 mg Oral Daily  . fludrocortisone  0.2 mg Oral Daily  . loperamide  2 mg Oral TID  . metoprolol tartrate  12.5 mg Oral QHS  . midodrine  10 mg Oral TID with meals  . multivitamin  1 tablet Oral QHS  . pantoprazole  40 mg Oral BID AC  . psyllium  1 packet Oral QHS  . pyridostigmine  60 mg Oral Daily  . simvastatin  20 mg Oral QPM  . sodium chloride flush  3 mL Intravenous Q12H   Continuous: . sodium chloride    . sodium chloride    . sodium chloride    . [START ON 12/07/2020]  ceFAZolin (ANCEF) IV     BDZ:HGDJME chloride, sodium chloride, sodium chloride, acetaminophen **OR** acetaminophen, chlorproMAZINE, heparin, heparin sodium (porcine), lidocaine (PF), lidocaine-prilocaine, ondansetron **OR** ondansetron (ZOFRAN) IV, pentafluoroprop-tetrafluoroeth, sodium chloride  flush  No Known Allergies   ROS:  A comprehensive review of systems was negative except for: Gastrointestinal: positive for rectal bleeding, recent colitis Genitourinary: positive for ESRD on dialysis  Blood pressure 104/61, pulse 68, temperature 97.9 F (36.6 C), resp. rate 18, height 6\' 2"  (1.88 m), weight 65 kg, SpO2 98 %. Physical Exam Vitals reviewed.  Constitutional:      General: He is not in acute distress.    Appearance: He is cachectic.  HENT:     Head: Normocephalic.     Nose: Nose normal.  Eyes:     Extraocular Movements: Extraocular movements intact.  Neck:     Comments: R IJ catheter in place, is a little high on vein Cardiovascular:     Rate and Rhythm: Normal rate.  Pulmonary:     Effort: Pulmonary effort is normal.  Skin:    General: Skin is warm.  Neurological:     General: No focal deficit present.     Mental Status: He is alert and oriented to person, place, and time.  Psychiatric:        Mood and Affect: Mood normal.        Behavior: Behavior normal.     Results: No results found for this or any previous visit (from the past 48 hour(s)).  DG Chest Port 1 View  Result Date: 12/06/2020 CLINICAL DATA:  End-stage renal disease. EXAM: PORTABLE CHEST 1 VIEW COMPARISON:  October 29, 2020. FINDINGS: The heart size and mediastinal contours are within normal limits. Both lungs are clear. Right internal jugular dialysis catheter is unchanged in position with distal tip in expected position of cavoatrial junction. The visualized skeletal structures are unremarkable. IMPRESSION: No active disease. Electronically Signed   By: Marijo Conception M.D.   On: 12/06/2020 10:34   Reviewed Xray and catheter without kinking   Assessment & Plan:  QUINTUS PREMO is a 70 y.o. male with a malfunctioning tunneled dialysis catheter placed October 2021. We attempt exchange over wire versus new stick. Discussed risk of bleeding, infection, injury to vessels, pneumothorax, and he  opted to proceed.  Discussed use of [place] and fluoroscopy.   -NPO midnight  -COVID negative on admission but has been inpatient for 34 days, will verify with OR if need repeat COVID test or not   All questions were answered to the satisfaction of the patient.     Virl Cagey 12/06/2020, 4:56 PM

## 2020-12-06 NOTE — Progress Notes (Signed)
Physical Therapy Treatment Patient Details Name: [name] MRN: [id number] DOB: 05/21/1951 Today's Date: 12/06/2020    History of Present Illness [name] is a 70 y.o. male with medical history significant for type 2 diabetes, CKD stage V recently initiated on hemodialysis MWF, atrial fibrillation/flutter on amiodarone and metoprolol and Eliquis, recurrent orthostatic hypotension, dyslipidemia, and recent discharge on 12/2 for colitis with associated adynamic ileus and acute blood loss anemia who presented back to the ED today after he was noted to have an episode of syncope while getting up to go to hemodialysis from his SNF.  On arrival to the ED his systolic blood pressures were in the 70 mmHg range.  He is noted to have some diarrhea, but he has recently completed course of ciprofloxacin and Flagyl by 11/23.  He was noted to have nonspecific colitis on pathology after recent colonoscopy on 11/17 and was started on prednisone taper which he continues to take at this time.    PT Comments    Patient demonstrates slow labored movement for sitting up at bedside due to weakness, frequently had to use BUE to support self while seated at bedside, very unsteady on feet with buckling of knees, loss of balance and limited to a couple of steps to transfer to chair.  Patient tolerated standing with RW for up to 30-40 seconds before having to sit due to legs giving way, and tolerated sitting up in chair for approximately 30 minutes before requesting to go back to bed mostly due to c/o pain over buttocks and sacral area.  Patient put back to bed and left in and encouraged to stay in side lying position while in bed.  Patient will benefit from continued physical therapy in hospital and recommended venue below to increase strength, balance, endurance for safe ADLs and gait.  Patient's orthostatic BP's as follows: lying 132/68, sitting 126/63, after transferring to chair 132/73.  Patient unable to stand long enough  to check standing BP.    Follow Up Recommendations  SNF     Equipment Recommendations  Rolling walker with 5" wheels    Recommendations for Other Services       Precautions / Restrictions Precautions Precautions: Fall Precaution Comments: Monitor BP Restrictions Weight Bearing Restrictions: No    Mobility  Bed Mobility Overal bed mobility: Needs Assistance Bed Mobility: Rolling;Sit to Supine;Sidelying to Sit Rolling: Min guard;Min assist Sidelying to sit: Mod assist Supine to sit: Min assist;Mod assist     General bed mobility comments: slow labored movement  Transfers Overall transfer level: Needs assistance Equipment used: Rolling walker (2 wheeled) Transfers: Sit to/from [facility] Sit to Stand: Mod assist;Max assist Stand pivot transfers: Mod assist;Max assist       General transfer comment: flexed trunk, knees buckles due to weakness  Ambulation/Gait Ambulation/Gait assistance: Max assist Gait Distance (Feet): 2 Feet Assistive device: Rolling walker (2 wheeled) Gait Pattern/deviations: Decreased step length - right;Decreased step length - left;Decreased stride length;Trunk flexed Gait velocity: slow   General Gait Details: limited to 2-3 slow labored unsteady steps with knees buckling due to weakness   Stairs             Wheelchair Mobility    Modified Rankin (Stroke Patients Only)       Balance Overall balance assessment: Needs assistance Sitting-balance support: Feet supported;No upper extremity supported Sitting balance-Leahy Scale: Fair Sitting balance - Comments: fair/good seated at bedside   Standing balance support: During functional activity;Bilateral upper extremity supported Standing  balance-Leahy Scale: Poor Standing balance comment: using RW                            Cognition Arousal/Alertness: Awake/alert Behavior During Therapy: WFL for tasks assessed/performed Overall Cognitive Status:  Within Functional Limits for tasks assessed                                        Exercises General Exercises - Lower Extremity Long Arc Quad: Seated;AROM;Strengthening;Both;10 reps Hip Flexion/Marching: Both;10 reps;Seated;AROM;Strengthening Toe Raises: Seated;AROM;Strengthening;10 reps;Both Heel Raises: Seated;AROM;Strengthening;Both;10 reps    General Comments        Pertinent Vitals/Pain Pain Assessment: Faces Faces Pain Scale: Hurts whole lot Pain Location: sacral area when sitting in chair Pain Descriptors / Indicators: Sore;Grimacing;Guarding Pain Intervention(s): Limited activity within patient's tolerance;Repositioned    Home Living                      Prior Function            PT Goals (current goals can now be found in the care plan section) Acute Rehab PT Goals Patient Stated Goal: return home after rehab PT Goal Formulation: With patient Time For Goal Achievement: 12/18/2020 Potential to Achieve Goals: Good Progress towards PT goals: Progressing toward goals    Frequency    Min 3X/week      PT Plan Current plan remains appropriate    Co-evaluation              AM-PAC PT "6 Clicks" Mobility   Outcome Measure  Help needed turning from your back to your side while in a flat bed without using bedrails?: A Little Help needed moving from lying on your back to sitting on the side of a flat bed without using bedrails?: A Lot Help needed moving to and from a bed to a chair (including a wheelchair)?: A Lot Help needed standing up from a chair using your arms (e.g., wheelchair or bedside chair)?: A Lot Help needed to walk in hospital room?: A Lot Help needed climbing 3-5 steps with a railing? : Total 6 Click Score: 12    End of Session   Activity Tolerance: Patient tolerated treatment well;Patient limited by fatigue Patient left: in chair;with call bell/phone within reach Nurse Communication: Mobility status PT Visit  Diagnosis: Unsteadiness on feet (R26.81);Other abnormalities of gait and mobility (R26.89);Muscle weakness (generalized) (M62.81)     Time: 7048-8891 PT Time Calculation (min) (ACUTE ONLY): 34 min  Charges:  $Therapeutic Exercise: 8-22 mins $Therapeutic Activity: 8-22 mins                     1:58 PM, 12/06/20 Lonell Grandchild, MPT Physical Therapist with [facility] [phone] office [phone] mobile phone

## 2020-12-06 NOTE — Progress Notes (Signed)
PROGRESS NOTE  [name] DXI:[id number] DOB: [birth year] DOA: 11/02/2020 PCP: Patient, No Pcp Per  Brief History: 70yo male with history of diabetes mellitus, CKD stage V started recently on dialysis Monday Wednesday Friday and still making urine, recent A. fib and was restarted on amiodarone metoprolol and Eliquis was sent fromSNFafter he was noted to have an episode of syncope while getting up to go to hemodialysis.On arrival to the ED his systolic blood pressures were in the 70 mmHg range. He is noted to have some diarrhea, but he has recently completed course of ciprofloxacin and Flagyl by 11/23. He was noted to have nonspecific colitis on pathology after recent colonoscopy on 11/17 and was started on prednisone taper which he continues to take at this time.He was just discharged after admission from 10/12/20 to 11/01/20 when he was treated for syncope from orthostasis as well as colitis as discussed above. He also required 5 units PRBC last admission. Hewas started on Solu-Medrol 60 mg daily 11/19--per GI>>po 40 mg daily--wean by 10 mg per week--started30 mg daily on 11/02/20. He is now finished with steroids   ED Course:Patient had received 250 mL of normal saline fluid bolus with improvement in blood pressure readings. Manual blood pressures confirm systolics in the low 767 range and heart rates fluctuate in the low 100 range. He denies any palpitations or chest pains or shortness of breath. Laboratory data with stable hemoglobin levels noted and he is noted to be hypokalemic with potassium 2.9 and he has a lactic acid level 2.6.   Assessment/Plan: Orthostatic hypotension -continues to be orthostatic despite pyridostigmine, midodrine and florinef -this lead to repeat admission after d/c on 11/01/20 -10/13/20--EchoEF 34-19%FXTKW 1 diastolic dysfunction -addedabdominal binder and knee stockings -decrease metoprolol to once daily -am cortisol 23.1 -TSH  1.912 -not a candidate for Northera -case discussed with renal, [name] d/c with stretcher HD until able to tolerate sitting and standing -11/30/20--discussed case with [name], [name] additional to offer, agrees with our current treatment -OOB with each shift; Check orthostatic daily--remains orthostatic  Chronic Diarrhea/Colitis -presented with diarrhea and LGIB last admission in Nov 2021 -improving with imodium -initially treated with cip/fl and then 4 weeks of steroid taper -10/15/20, 11/06/20 and12/22/21 GI pathogen panel neg -11/02/20 Cdiff neg -11/05/20 Stool O&P--neg -GI following -10/17/20 colonoscopy-area of moderately congested mucosa with ulceration was found in the sigmoid colon and in the descending colon.Bllood noted in rectum, sigmoid and descending colon -11/21/20 colonoscopy--polyps in asc colon & 2 polyps hepatic flexure, extensive circumferential ulcers in distal sigmoid -11/17/21Biopsies of rectum and sigmoid colon show active nonspecific colitis w/ ulceration c/f medication induced (NSAIDs), ischemia, or stercoral proctitis -12//22/21 TI and colon biopsies--neg for inflammation; +tubular adenmoa -GI suspects ischemic injury -Fecal elastase and qualitative fecal fat analysisordered by GI -11/30/20--discussed case with [name], [name] additional to offer, agrees with our current treatment  Anemia of CKD -had rectal bleed last admit requiring 5 units PRBC -11/21/20--received 1 unit this admit -multifactorial including CKD, blood draws, chronic disease -iron studies consistent with ACD  ESRD -Biopsy results from outpatient nephrologist moderate to severe arterial nephrosclerosis with 75% global glomerulosclerosis, focal and segmental glomerular atrophic scarring and diffuse moderate to severe tubulointerstitial scarring --started recently on dialysis MWF --HD on 12/28--unable to remove fluid --having intradialytic  hypotension already on midodrine-->having difficulty removing fluid -discussed with [name] to position in sitting position for HD  Recent paroxysmal A. fib --started  on amiodaronelast admission --Eliquis--holdingdue to his rectal bleeding and acute blood loss anemia --Patient understands risks and benefits of holding anticoagulation and he seems to be reluctant to start it again-we will need close follow-up with his cardiologist/PCP. --metoprolol stopped due to orthostasis initially --start metoprolol 12.5 mg bid as pt having more afib RVR episodesfrom last admission>>decrease to once daily  Hyponatremia, --sodiumis low continue to adjust dialysis.  Hyperlipidemia: --Continue statins.  Acute urinary retention- s/p foley placement 12/16. Foley removed 12/23 and he had been voiding. Unfortunately he developed acute urinary retention again on 12/25 and foley was replaced with >900 mL urine relieved. Will leave foley in place for now until he can follow up outpatient with urology.  Intermittent Hematuria/CAUTI -likely due to foley trauma and intermittent movement/UTI -anchor foley -am CBC--Hgb stable -UA>50WBC -urine culture= serratia -completed course of ceftriaxone  Goals of Care -long discussion with patient's daughter at bedside 11/29/20 Advance care planning, including the explanation and discussion of advance directives was carried out with the patient and family. Code status including explanations of "Full Code" and "DNR" and alternatives were discussed in detail. Discussion of end-of-life issues including but not limited palliative care, hospice care and the concept of hospice, other end-of-life care options, power of attorney for health care decisions, living wills, and physician orders for life-sustaining treatment were also discussed with the patient and family. Total face to face time 36 minutes. -patient wants to continue full scope of  care -12/02/19--confirms full scope of care; Plan to d/c to SNF and stretcher HD facility -12/04/19--[name] appears to have little motivation--refuses to sit up in chair; minimal effort with PT and intermittently refuses PT -ultimate plan is to find HD facility which will accomodate "stetcher dialysis"  Status is: Inpatient  Remains inpatient appropriate because:IV treatments appropriate due to intensity of illness or inability to take PO Remains orthostatic putting him at high risk for re-admission Also unable to remove fluid on HD  Dispo: Patient From: [place] Planned Disposition: [place] Expected discharge date: 12/07/2020 Medically stable for discharge: No  Family Communication: daughter updated 12/01/20  significant other updated 12/02/20  Consultants: renal, GI, palliative  Code Status: FULL   DVT Prophylaxis: SCDs   Procedures: As Listed in Progress Note Above  Antibiotics: None  Subjective:  Patient tolerated HD yesterday.   Objective: Vitals:   12/05/20 2015 12/05/20 2030 12/06/20 0549 12/06/20 0630  BP: 112/62 137/71 (!) 99/47 104/61  Pulse: (!) 107 (!) 105 73 68  Resp:  18 15 18   Temp:  98.1 F (36.7 C) 97.9 F (36.6 C)   TempSrc:  Oral    SpO2:   98%   Weight:      Height:        Intake/Output Summary (Last 24 hours) at 12/06/2020 1539 Last data filed at 12/06/2020 1300 Gross per 24 hour  Intake 960 ml  Output 132 ml  Net 828 ml   Weight change: 0 kg Exam:   General:  Pt is alert, follows commands appropriately, not in acute distress  HEENT: No icterus, No thrush, No neck mass, [place]/AT  Cardiovascular: normal S1/S2, no rubs, no gallops  Respiratory: bibasilar rales. No wheeze  Abdomen: Soft/+BS, non tender, non distended, no guarding  Extremities: No edema, No lymphangitis, No petechiae, No rashes, no synovitis  Data Reviewed: I have personally  reviewed following labs and imaging studies Basic Metabolic Panel: Recent Labs  Lab 11/30/20 0621 12/01/20 0658 12/02/20 0557 12/03/20 0509 12/04/20 0500  NA 128* 131* 126* 129* 131*  K 3.4* 3.0* 3.4* 3.3* 3.2*  CL 94* 97* 95* 95* 96*  CO2 22 25 23 22 24   GLUCOSE 97 88 130* 95 98  BUN 54* 35* 44* 52* 29*  CREATININE 6.03* 4.51* 5.55* 6.17* 4.15*  CALCIUM 8.0* 7.7* 7.9* 7.8* 7.6*  PHOS 3.6 3.1 4.2 5.0* 3.3   Liver Function Tests: Recent Labs  Lab 11/30/20 0621 12/01/20 0658 12/02/20 0557 12/03/20 0509 12/04/20 0500  ALBUMIN 2.2* 2.0* 2.0* 2.0* 2.2*   No results for input(s): LIPASE, AMYLASE in the last 168 hours. No results for input(s): AMMONIA in the last 168 hours. Coagulation Profile: No results for input(s): INR, PROTIME in the last 168 hours. CBC: Recent Labs  Lab 12/03/20 0509  WBC 11.6*  HGB 8.1*  HCT 26.1*  MCV 95.6  PLT 269   Cardiac Enzymes: No results for input(s): CKTOTAL, CKMB, CKMBINDEX, TROPONINI in the last 168 hours. BNP: Invalid input(s): POCBNP CBG: No results for input(s): GLUCAP in the last 168 hours. HbA1C: No results for input(s): HGBA1C in the last 72 hours. Urine analysis:    Component Value Date/Time   COLORURINE AMBER (A) 12/01/2020 1750   APPEARANCEUR CLOUDY (A) 12/01/2020 1750   LABSPEC 1.008 12/01/2020 1750   PHURINE 6.0 12/01/2020 1750   GLUCOSEU NEGATIVE 12/01/2020 1750   HGBUR LARGE (A) 12/01/2020 1750   BILIRUBINUR NEGATIVE 12/01/2020 1750   KETONESUR NEGATIVE 12/01/2020 1750   PROTEINUR 100 (A) 12/01/2020 1750   NITRITE NEGATIVE 12/01/2020 1750   LEUKOCYTESUR LARGE (A) 12/01/2020 1750    Recent Results (from the past 240 hour(s))  Culture, Urine     Status: Abnormal   Collection Time: 12/01/20  5:50 PM   Specimen: Urine, Catheterized  Result Value Ref Range Status   Specimen Description   Final    URINE, CATHETERIZED Performed at [facility], [address], [place], [place] [address]    Special Requests    Final    NONE Performed at [facility], [address], [place], [place] [address]    Culture >=100,000 COLONIES/mL SERRATIA MARCESCENS (A)  Final   Report Status 12/04/2020 FINAL  Final   Organism ID, Bacteria SERRATIA MARCESCENS (A)  Final      Susceptibility   Serratia marcescens - MIC*    CEFAZOLIN >=64 RESISTANT Resistant     CEFEPIME <=0.12 SENSITIVE Sensitive     CEFTRIAXONE 1 SENSITIVE Sensitive     CIPROFLOXACIN 2 INTERMEDIATE Intermediate     GENTAMICIN <=1 SENSITIVE Sensitive     NITROFURANTOIN 256 RESISTANT Resistant     TRIMETH/SULFA <=20 SENSITIVE Sensitive     * >=100,000 COLONIES/mL SERRATIA MARCESCENS     Scheduled Meds: . amiodarone  200 mg Oral Daily  . calcitRIOL  0.5 mcg Oral Daily  . Chlorhexidine Gluconate Cloth  6 each Topical Q0600  . Chlorhexidine Gluconate Cloth  6 each Topical Q0600  . collagenase   Topical Daily  . darbepoetin (ARANESP) injection - DIALYSIS  200 mcg Intravenous Q Mon-HD  . famotidine  20 mg Oral Daily  . feeding supplement  237 mL Oral BID BM  . finasteride  5 mg Oral Daily  . fludrocortisone  0.2 mg Oral Daily  . loperamide  2 mg Oral TID  . metoprolol tartrate  12.5 mg Oral QHS  . midodrine  10 mg Oral TID with meals  . multivitamin  1 tablet Oral QHS  . pantoprazole  40 mg Oral BID AC  . psyllium  1 packet Oral QHS  . pyridostigmine  60 mg Oral Daily  . simvastatin  20 mg Oral QPM  . sodium chloride flush  3 mL Intravenous Q12H   Continuous Infusions: . sodium chloride    . sodium chloride    . sodium chloride    . cefTRIAXone (ROCEPHIN)  IV 1 g (12/05/20 1829)    Procedures/Studies: MR BRAIN WO CONTRAST  Result Date: 11/16/2020 CLINICAL DATA:  Question deposition disease. Hypotension. Altered mental status. EXAM: MRI HEAD WITHOUT CONTRAST TECHNIQUE: Multiplanar, multiecho pulse sequences of the brain and surrounding structures were obtained without intravenous contrast. COMPARISON:  Head CT 01/25/2020 FINDINGS:  Brain: Diffusion imaging does not show any acute or subacute infarction. No focal abnormality affects brainstem or cerebellum. Cerebral hemispheres show mild age related volume loss without evidence of small-vessel disease or large vessel infarction. No mass lesion, hemorrhage, hydrocephalus or extra-axial collection. Vascular: Major vessels at the base of the brain show flow. Skull and upper cervical spine: Negative Sinuses/Orbits: Clear/normal Other: None IMPRESSION: No acute or reversible finding. Mild age related volume loss. No evidence of small-vessel disease or large vessel infarction. Electronically Signed   By: Nelson Chimes M.D.   On: 11/16/2020 14:01   DG Chest Port 1 View  Result Date: 12/06/2020 CLINICAL DATA:  End-stage renal disease. EXAM: PORTABLE CHEST 1 VIEW COMPARISON:  October 29, 2020. FINDINGS: The heart size and mediastinal contours are within normal limits. Both lungs are clear. Right internal jugular dialysis catheter is unchanged in position with distal tip in expected position of cavoatrial junction. The visualized skeletal structures are unremarkable. IMPRESSION: No active disease. Electronically Signed   By: Marijo Conception M.D.   On: 12/06/2020 10:34   DG Abd Portable 1V  Result Date: 11/28/2020 CLINICAL DATA:  Abdominal distension abdominal pain EXAM: PORTABLE ABDOMEN - 1 VIEW COMPARISON:  November 07, 2020 CT assessment, abdominal plain film from November 15, 2020 FINDINGS: Decreased distension of bowel loops in the abdomen when compared to the study from November 15, 2020. Mild distension currently of scattered gas-filled loops of small bowel. Suggestion of small amount of proximal rectal gas. No acute skeletal process on limited assessment. Signs of basilar atelectasis. IMPRESSION: Decreased distension of bowel loops in the abdomen when compared to the study from November 15, 2020. Mild distension currently of scattered gas-filled loops of small bowel may reflect mild ileus.  Suggestion of small amount of proximal rectal gas. Electronically Signed   By: Zetta Bills M.D.   On: 11/28/2020 16:15   DG Abd Portable 1V  Result Date: 11/15/2020 CLINICAL DATA:  Abdominal distension EXAM: PORTABLE ABDOMEN - 1 VIEW COMPARISON:  10/25/2020 FINDINGS: The subdiaphragmatic region and right flank are excluded from view. Multiple gas-filled prominent loops of large and small bowel are seen throughout the visualized abdomen in keeping with un underlying ileus. No gross free intraperitoneal gas. Gas and stool is seen within the rectal vault. No organomegaly. No acute bone abnormality. IMPRESSION: Mild ileus. Electronically Signed   By: Fidela Salisbury MD   On: 11/15/2020 04:15   CT Angio Abd/Pel w/ and/or w/o  Result Date: 11/07/2020 CLINICAL DATA:  Evaluate for mesenteric ischemia. End-stage renal disease on dialysis. Bowel wall thickening on exam from 10/12/2020. EXAM: CTA ABDOMEN AND PELVIS WITHOUT AND WITH CONTRAST TECHNIQUE: Multidetector CT imaging of the abdomen and pelvis was performed using the standard protocol during bolus administration of intravenous contrast. Multiplanar reconstructed images and MIPs were obtained and reviewed to evaluate the vascular  anatomy. CONTRAST:  34mL OMNIPAQUE IOHEXOL 350 MG/ML SOLN COMPARISON:  10/22/2020 FINDINGS: VASCULAR Aorta: Mild atherosclerotic disease in the abdominal aorta without aneurysm or dissection. Celiac: Celiac trunk is patent without significant stenosis. Focal dilatation of the trunk measures up to 9 mm. Main branch vessels are patent. SMA: Replaced right hepatic artery. SMA is widely patent without significant stenosis. No evidence for aneurysm or dissection. Renals: Main right renal artery is widely patent without stenosis, aneurysm or dissection. There is small accessory right renal artery. High-grade focal narrowing in the proximal main left renal artery without significant plaque in this area. Findings could be related to a  dissection but indeterminate. Small accessory left renal artery is patent. IMA: Patent Inflow: Atherosclerotic disease involving the proximal left common iliac artery without significant stenosis. Common iliac arteries are patent bilaterally. Disease and stenosis involving the right internal iliac artery. Left internal iliac artery is patent. Bilateral external iliac arteries are widely patent. Proximal Outflow: Proximal femoral arteries are patent bilaterally. Veins: Main portal venous system is patent. Limited evaluation of the superior cavoatrial junction on the arterial phase imaging due to non-opacified blood in this area. IVC and renal veins are patent. Narrowing of the left common iliac vein from the right common iliac artery is a normal anatomic variant. Review of the MIP images confirms the above findings. NON-VASCULAR Lower chest: Small bilateral pleural effusions with compressive atelectasis. Hepatobiliary: High-density material in the gallbladder are suggestive for small stones. The gallbladder is moderately distended without definite inflammatory changes. There is colon anterior to the liver. No discrete liver lesion. No biliary dilatation. Gallbladder distension has minimally changed since the previous examination. Pancreas: Unremarkable. No pancreatic ductal dilatation or surrounding inflammatory changes. Spleen: Normal in size without focal abnormality. Adrenals/Urinary Tract: Normal appearance of the adrenal glands. Both kidneys are small with scattered calcifications. Findings are suggestive for nonobstructive renal calculi. Stomach/Bowel: Normal appearance of the stomach and duodenum. There is a rectal catheter with an inflated balloon. Sigmoid colon is moderately distended with mild wall thickening. However, the sigmoid wall thickening is less impressive on the venous phase imaging. Cecum is distended and contains a large amount of stool. Transverse colon is distended with gas. Lymphatic: No  significant lymph node enlargement in the abdomen or pelvis. Reproductive: Stable appearance of the prostate. Other: Diffuse subcutaneous edema. Small amount of pelvic ascites which is new. Trace ascites in the left lower quadrant of the abdomen. Trace ascites in the upper abdomen. Negative for free air. Musculoskeletal: Chronic disc space narrowing with endplate changes at E9-H3. Stable disc space narrowing at L5-S1. IMPRESSION: VASCULAR 1. Atherosclerotic disease in the abdominal aorta without aneurysm or significant stenosis. Aortic Atherosclerosis (ICD10-I70.0). 2. Main mesenteric arteries are patent without significant stenosis. No evidence to suggest chronic mesenteric ischemia. 3. Multiple renal arteries as described. Focal narrowing the proximal left main renal artery that could be related to atherosclerotic disease or focal dissection in this area. NON-VASCULAR 1. The sigmoid colon has been partially decompressed since 10/22/2020 and placement of the rectal tube. There continues to be gaseous distension of the sigmoid colon and transverse colon. Mild wall thickening in the sigmoid colon is nonspecific but may be related to the partial decompression rather than infectious or inflammatory process. Findings are suggestive for an underlying colonic ileus. 2. Bilateral kidneys are atrophic with bilateral calcifications. Findings are compatible with history of end-stage renal disease. Calcifications could represent nonobstructive renal calculi. 3. Diffuse subcutaneous edema with small amount of ascites, most prominent in the  pelvis. 4. Significant disc space disease at L2-L3. 5. Bilateral pleural effusions with compressive atelectasis at the lung bases. 6. Cholelithiasis with moderate gallbladder distension. Electronically Signed   By: Markus Daft M.D.   On: 11/07/2020 17:58    Clanford Wynetta Emery, MD  How to contact the [facility] Attending or Consulting provider [place] or covering provider during after hours [place],  for this patient?  1. Check the care team in [facility] and look for a) attending/consulting TRH provider listed and b) the [facility] team listed 2. Log into [email or web address] and use [place]'s universal password to access. If you do not have the password, please contact the hospital operator. 3. Locate the [facility] provider you are looking for under [facility] and page to a number that you can be directly reached. 4. If you still have difficulty reaching the provider, please page the [facility] (Director on Call) for the Hospitalists listed on amion for assistance.  12/06/2020, 3:39 PM   LOS: 34 days

## 2020-12-06 NOTE — TOC Progression Note (Signed)
Transition of Care [facility]) - Progression Note    Patient Details  Name: [name] MRN: [id number] Date of Birth: Jul 10, 1951  [facility]) CM/SW Contact  Shade Flood, LCSW Phone Number: 12/06/2020, 2:14 PM  Clinical Narrative:     TOC following. Spoke with SW, Preston, at [facility], today to follow up on status of pt's referral. Per [facility], they have received clinical for pt. They need an updated COVID test and Hep B Panel. Updated MD. Per [facility], they are working on finding an MD to accept pt and then they will be good to go.   Spoke with Otila Kluver in admissions at [facility] who states that if pt cannot dc tomorrow, then they will not have a bed available for him until possibly next week or beyond. Updated Sloane at [facility] of same and she stated that she cannot say when the MD acceptance will occur. Otila Kluver also asked that information about why pt will need stretcher transport to and from HD be included in DC summary so that they will have it to provide to EMS when they make the arrangements. Tina asked for [facility] to contact [facility] to inquire if there were any other documentation needs.  Contacted [facility] to inquire if there was any information they would need from the hospital related to pt's transports to and from HD. Was informed that [place] would just set up the arrangements with the billing office. There was no other information they would require.  Updated MD. Pt reportedly having his HD catheter changed tomorrow. Will follow up in AM.  Expected Discharge Plan: [place] Barriers to Discharge: Waiting for outpatient dialysis  Expected Discharge Plan and Services Expected Discharge Plan: [place]                                               Social Determinants of Health (SDOH) Interventions    Readmission Risk Interventions No flowsheet data found.

## 2020-12-06 NOTE — Progress Notes (Signed)
Patient ID: [name], male   DOB: Sep 11, 1951, 70 y.o.   MRN: [id number] [facility] Progress Note   Assessment/ Plan:   1.  Recurrent syncope with orthostatic hypotension/autonomic dysfunction: Minimal ultrafiltration with dialysis so as not to exacerbate symptoms with ongoing support using midodrine, fludrocortisone and pyridostigmine.  He is also on external compression devices with abdominal binder and compression stockings.  Case management is assisting with [facility] to [facility] for stretcher dialysis (apparently a referral will be sent from [facility] as he was an established patient there).  He will be discharged to [facility], SNF at [facility] upon discharge. -recommend liberalizing sodium/solute intake  2. ESRD: Continue with hemodialysis on a Monday/Wednesday/Friday schedule possibly in recliner (that has the option of laying him flat should he get symptomatically hypotensive) with no ultrafiltration.  Process underway for placement to the hemodialysis unit that can accommodate stretcher dialysis.  Vascular surgery unavailable at this campus 4. CKD-MBD: Calcium and phosphorus level currently at goal without phosphorus binder.  Remains on calcitriol for PTH control. 5. Nutrition: With evidence of protein calorie malnutrition likely exacerbated by recent episode of colitis.  Status post ciprofloxacin and metronidazole. 6.  Colitis: Suspected to be infectious and status post treatment with ciprofloxacin and metronidazole.  Diarrhea improving. 7. Hyponatremia: likely related to orthostatic hypotension, on 137Na bath, advised patient to liberalize sodium/solute intake esp for orthostatic hypotension 8. Access: sluggish flows in RIJ TDC despite TPA, will contact Dr. Constance Haw for catheter exchange  Subjective:   Sluggish flows despite TPA, tolerated HD in recliner yesterday but does need max assist. Needed to be transferred from bed to recliner quickly.  Orthostatic hypotension still present. No complaints/acute events.   Objective:   BP 104/61 (BP Location: Left Arm)   Pulse 68   Temp 97.9 F (36.6 C)   Resp 18   Ht 6\' 2"  (1.88 m)   Wt 65 kg   SpO2 98%   BMI 18.40 kg/m   Physical Exam: Gen: nad, comfortable, laying flat in bed CVS: Pulse regular rhythm, normal rate, S1 and S2 normal Resp: Clear to auscultation bilaterally without any rales/rhonchi.   Abd: Soft, flat, nontender, bowel sounds normal Ext: no edema Neuro: speech clear and coherent, alert and oriented, moves all extremities spontaneously Access: Right IJ TDC in place (high venotomy site)  Labs: BMET Recent Labs  Lab 11/30/20 0621 12/01/20 0658 12/02/20 0557 12/03/20 0509 12/04/20 0500  NA 128* 131* 126* 129* 131*  K 3.4* 3.0* 3.4* 3.3* 3.2*  CL 94* 97* 95* 95* 96*  CO2 22 25 23 22 24   GLUCOSE 97 88 130* 95 98  BUN 54* 35* 44* 52* 29*  CREATININE 6.03* 4.51* 5.55* 6.17* 4.15*  CALCIUM 8.0* 7.7* 7.9* 7.8* 7.6*  PHOS 3.6 3.1 4.2 5.0* 3.3   CBC Recent Labs  Lab 12/03/20 0509  WBC 11.6*  HGB 8.1*  HCT 26.1*  MCV 95.6  PLT 269     Medications:    . amiodarone  200 mg Oral Daily  . bismuth subsalicylate  30 mL Oral Q6H  . calcitRIOL  0.5 mcg Oral Daily  . Chlorhexidine Gluconate Cloth  6 each Topical Q0600  . Chlorhexidine Gluconate Cloth  6 each Topical Q0600  . collagenase   Topical Daily  . darbepoetin (ARANESP) injection - DIALYSIS  200 mcg Intravenous Q Mon-HD  . famotidine  20 mg Oral Daily  . feeding supplement  237 mL Oral BID BM  . finasteride  5 mg Oral Daily  . fludrocortisone  0.2 mg Oral Daily  . loperamide  2 mg Oral TID  . metoprolol tartrate  12.5 mg Oral QHS  . midodrine  10 mg Oral TID with meals  . multivitamin  1 tablet Oral QHS  . pantoprazole  40 mg Oral BID AC  . psyllium  1 packet Oral QHS  . pyridostigmine  60 mg Oral Daily  . simvastatin  20 mg Oral QPM  . sodium chloride flush  3 mL Intravenous Q12H

## 2020-12-06 NOTE — Consult Note (Signed)
[place]  Reason for Consult: Malfunctioning tunneled dialysis catheter  Referring Physician:  Dr.Singh  Chief Complaint    Loss of Consciousness      HPI: [name] is [name] 70 y.o. male with [name], [name], [name] on MWF Dialysis with [name] tunneled internal jugular dialysis catheter placed in October in [place].  He has had recent colitis and was treated with antibiotics and steroids.  He was admitted with orthostatic hypotension and anemia. His eliquis has been held due to rectal bleeding and anemia.  He has attempted at dialysis that has poor flows and TPA has not helped. CXR demonstrates no kinks. The nephrology team suspects [name] fibrin sheath or issue with the distal catheter.   Past Medical History:  Diagnosis Date  . Diabetes mellitus, type 2 ([place])   . [name] on hemodialysis ([place])   . GI bleed   . Orthostatic hypotension   . PAF (paroxysmal atrial fibrillation) ([place])     Past Surgical History:  Procedure Laterality Date  . BIOPSY  10/17/2020   Procedure: BIOPSY;  Surgeon: Rogene Houston, MD;  [facility];  Service: Endoscopy;;  ascending and descending  . BIOPSY  11/21/2020   Procedure: BIOPSY;  Surgeon: Rogene Houston, MD;  [facility];  Service: Endoscopy;;  . COLONOSCOPY WITH PROPOFOL N/[name] 10/17/2020   Procedure: COLONOSCOPY WITH PROPOFOL;  Surgeon: Rogene Houston, MD;  [facility];  Service: Endoscopy;  Laterality: N/[name];  . COLONOSCOPY WITH PROPOFOL N/[name] 11/21/2020   Procedure: COLONOSCOPY WITH PROPOFOL;  Surgeon: Rogene Houston, MD;  [facility];  Service: Endoscopy;  Laterality: N/[name];  . EXTERNAL FIXATION LEG Right 06/08/2018   Procedure: EXTERNAL FIXATION COMMINUTED FIBULA FRACTURE;  Surgeon: Netta Cedars, MD;  Location: [place];  Service: Orthopedics;  Laterality: Right;  . EXTERNAL FIXATION REMOVAL Right 06/11/2018   Procedure: REMOVAL EXTERNAL FIXATION LEG;  Surgeon: Shona Needles, MD;  Location: [place];  Service: Orthopedics;  Laterality: Right;  . I & D EXTREMITY Right 06/08/2018   Procedure: IRRIGATION AND DEBRIDEMENT OPEN TIBIA FRACTURE;  Surgeon: Netta Cedars, MD;  Location: [place];  Service: Orthopedics;  Laterality: Right;  . I & D EXTREMITY Right 06/11/2018   Procedure: IRRIGATION AND DEBRIDEMENT EXTREMITY;  Surgeon: Shona Needles, MD;  Location: [place];  Service: Orthopedics;  Laterality: Right;  . NOSE SURGERY    . ORIF ANKLE FRACTURE Right 06/08/2018   Procedure: OPEN REDUCTION INTERNAL FIXATION (ORIF) MEDIAL MALLEOLUS;  Surgeon: Netta Cedars, MD;  Location: [place];  Service: Orthopedics;  Laterality: Right;  . ORIF ANKLE FRACTURE Right 06/11/2018   Procedure: OPEN REDUCTION INTERNAL FIXATION (ORIF) ANKLE FRACTURE;  Surgeon: Shona Needles, MD;  Location: [place];  Service: Orthopedics;  Laterality: Right;  . POLYPECTOMY  11/21/2020   Procedure: POLYPECTOMY;  Surgeon: Rogene Houston, MD;  [facility];  Service: Endoscopy;;  . TIBIA DEBRIDEMENT Right 06/08/2018    Family History  Problem Relation Age of Onset  . Hypertension Father     Social History   Tobacco Use  . Smoking status: Never Smoker  . Smokeless tobacco: Never Used  Vaping Use  . Vaping Use: Never used  Substance Use Topics  . Alcohol use: Yes    Comment: occasional  . Drug use: Never    Medications:  I have reviewed the patient's current medications. Prior to Admission:  Medications Prior to Admission  Medication  Sig Dispense Refill Last Dose  . amiodarone (PACERONE) 200 MG tablet Take 200 mg by mouth daily.   11/01/2020 at 0800  . metoprolol tartrate (LOPRESSOR) 25 MG tablet Take 0.5 tablets (12.5 mg total) by mouth 2 (two) times daily. 30 tablet 0 11/01/2020 at 0800  . midodrine (PROAMATINE) 10 MG tablet Take 1 tablet (10 mg total) by mouth with breakfast, with lunch, and with evening meal. 90 tablet 0 11/01/2020 at 1130  . ondansetron (ZOFRAN) 4 MG tablet Take 4 mg by  mouth every 6 (six) hours as needed for nausea.    10/31/2020  . pantoprazole (PROTONIX) 40 MG tablet Take 40 mg by mouth daily.   11/02/2020 at 0600  . promethazine (PHENERGAN) 25 MG/ML injection Inject 25 mg into the muscle every 4 (four) hours as needed for nausea or vomiting.   11/01/2020 at Unknown time  . simvastatin (ZOCOR) 20 MG tablet Take 20 mg by mouth every evening.  0 10/31/2020  . [EXPIRED] predniSONE (DELTASONE) 10 MG tablet Take 3 tablets (30 mg total) by mouth daily with breakfast for 7 days. Then wean by 10mg  weekly. (Patient taking differently: Take 30 mg by mouth daily with breakfast. For 7 days) 21 tablet 0    Scheduled: . amiodarone  200 mg Oral Daily  . calcitRIOL  0.5 mcg Oral Daily  . Chlorhexidine Gluconate Cloth  6 each Topical Q0600  . Chlorhexidine Gluconate Cloth  6 each Topical Q0600  . Chlorhexidine Gluconate Cloth  6 each Topical Once   And  . Chlorhexidine Gluconate Cloth  6 each Topical Once  . collagenase   Topical Daily  . darbepoetin (ARANESP) injection - DIALYSIS  200 mcg Intravenous Q Mon-HD  . famotidine  20 mg Oral Daily  . feeding supplement  237 mL Oral BID BM  . finasteride  5 mg Oral Daily  . fludrocortisone  0.2 mg Oral Daily  . loperamide  2 mg Oral TID  . metoprolol tartrate  12.5 mg Oral QHS  . midodrine  10 mg Oral TID with meals  . multivitamin  1 tablet Oral QHS  . pantoprazole  40 mg Oral BID AC  . psyllium  1 packet Oral QHS  . pyridostigmine  60 mg Oral Daily  . simvastatin  20 mg Oral QPM  . sodium chloride flush  3 mL Intravenous Q12H   Continuous: . sodium chloride    . sodium chloride    . sodium chloride    . [START ON 12/07/2020]  ceFAZolin (ANCEF) IV     MWN:UUVOZD chloride, sodium chloride, sodium chloride, acetaminophen **OR** acetaminophen, chlorproMAZINE, heparin, heparin sodium (porcine), lidocaine (PF), lidocaine-prilocaine, ondansetron **OR** ondansetron (ZOFRAN) IV, pentafluoroprop-tetrafluoroeth, sodium chloride  flush  No Known Allergies   ROS:  [name] comprehensive review of systems was negative except for: Gastrointestinal: positive for rectal bleeding, recent colitis Genitourinary: positive for [name] on dialysis  Blood pressure 104/61, pulse 68, temperature 97.9 F (36.6 C), resp. rate 18, height 6\' 2"  (1.88 m), weight 65 kg, SpO2 98 %. Physical Exam Vitals reviewed.  Constitutional:      General: He is not in acute distress.    Appearance: He is cachectic.  HENT:     Head: Normocephalic.     Nose: Nose normal.  Eyes:     Extraocular Movements: Extraocular movements intact.  Neck:     Comments: R IJ catheter in place, is [name] little high on vein Cardiovascular:     Rate and Rhythm: Normal rate.  Pulmonary:     Effort: Pulmonary effort is normal.  Skin:    General: Skin is warm.  Neurological:     General: No focal deficit present.     Mental Status: He is alert and oriented to person, place, and time.  Psychiatric:        Mood and Affect: Mood normal.        Behavior: Behavior normal.     Results: No results found for this or any previous visit (from the past 48 hour(s)).  DG Chest Port 1 View  Result Date: 12/06/2020 CLINICAL DATA:  End-stage renal disease. EXAM: PORTABLE CHEST 1 VIEW COMPARISON:  October 29, 2020. FINDINGS: The heart size and mediastinal contours are within normal limits. Both lungs are clear. Right internal jugular dialysis catheter is unchanged in position with distal tip in expected position of cavoatrial junction. The visualized skeletal structures are unremarkable. IMPRESSION: No active disease. Electronically Signed   By: Marijo Conception M.D.   On: 12/06/2020 10:34   Reviewed Xray and catheter without kinking   Assessment & Plan:  AMAHD MORINO is [name] 70 y.o. male with [name] malfunctioning tunneled dialysis catheter placed October 2021. We attempt exchange over wire versus new stick. Discussed risk of bleeding, infection, injury to vessels, pneumothorax, and he  opted to proceed.  Discussed use of [place] and fluoroscopy.   -NPO midnight  -COVID negative on admission but has been inpatient for 34 days, will verify with OR if need repeat COVID test or not   All questions were answered to the satisfaction of the patient.     Virl Cagey 12/06/2020, 4:56 PM

## 2020-12-07 ENCOUNTER — Inpatient Hospital Stay (HOSPITAL_COMMUNITY): Payer: Medicare Other

## 2020-12-07 ENCOUNTER — Inpatient Hospital Stay (HOSPITAL_COMMUNITY): Payer: Medicare Other | Admitting: Certified Registered"

## 2020-12-07 ENCOUNTER — Encounter (HOSPITAL_COMMUNITY)
Admission: EM | Disposition: A | Discharge status: Other Acute Inpatient Hospital | Payer: Self-pay | Source: Skilled Nursing Facility | Attending: Family Medicine

## 2020-12-07 ENCOUNTER — Encounter (HOSPITAL_COMMUNITY): Payer: Self-pay | Admitting: Internal Medicine

## 2020-12-07 DIAGNOSIS — I951 Orthostatic hypotension: Secondary | ICD-10-CM | POA: Diagnosis not present

## 2020-12-07 DIAGNOSIS — Z992 Dependence on renal dialysis: Secondary | ICD-10-CM | POA: Diagnosis not present

## 2020-12-07 DIAGNOSIS — R140 Abdominal distension (gaseous): Secondary | ICD-10-CM | POA: Diagnosis not present

## 2020-12-07 DIAGNOSIS — N186 End stage renal disease: Secondary | ICD-10-CM | POA: Diagnosis not present

## 2020-12-07 DIAGNOSIS — T8249XA Other complication of vascular dialysis catheter, initial encounter: Secondary | ICD-10-CM

## 2020-12-07 HISTORY — PX: INSERTION OF DIALYSIS CATHETER: SHX1324

## 2020-12-07 LAB — SURGICAL PCR SCREEN
MRSA, PCR: NEGATIVE
Staphylococcus aureus: NEGATIVE

## 2020-12-07 LAB — HEPATITIS B SURFACE ANTIGEN: Hepatitis B Surface Ag: NONREACTIVE

## 2020-12-07 LAB — GLUCOSE, CAPILLARY
Glucose-Capillary: 111 mg/dL — ABNORMAL HIGH (ref 70–99)
Glucose-Capillary: 66 mg/dL — ABNORMAL LOW (ref 70–99)
Glucose-Capillary: 87 mg/dL (ref 70–99)

## 2020-12-07 LAB — SARS CORONAVIRUS 2 (TAT 6-24 HRS): SARS Coronavirus 2: NEGATIVE

## 2020-12-07 SURGERY — INSERTION OF DIALYSIS CATHETER
Anesthesia: General | Laterality: Right

## 2020-12-07 MED ORDER — ONDANSETRON HCL 4 MG/2ML IJ SOLN
INTRAMUSCULAR | Status: DC | PRN
  Administered 2020-12-07: 4 mg via INTRAVENOUS

## 2020-12-07 MED ORDER — LIDOCAINE HCL (PF) 1 % IJ SOLN
INTRAMUSCULAR | Status: DC | PRN
  Administered 2020-12-07: 4 mL via INTRADERMAL

## 2020-12-07 MED ORDER — HYDROMORPHONE HCL 1 MG/ML IJ SOLN: 0.5000 mg | INTRAMUSCULAR | Status: DC | PRN

## 2020-12-07 MED ORDER — HEPARIN SODIUM (PORCINE) 1000 UNIT/ML IJ SOLN
INTRAMUSCULAR | Status: AC
  Filled 2020-12-07: qty 4

## 2020-12-07 MED ORDER — HEPARIN 1000 UNIT/ML FOR PERITONEAL DIALYSIS
INTRAMUSCULAR | Status: DC | PRN
  Administered 2020-12-07 (×2): 4 mL via INTRAPERITONEAL

## 2020-12-07 MED ORDER — SODIUM CHLORIDE (PF) 0.9 % IJ SOLN
INTRAMUSCULAR | Status: DC | PRN
  Administered 2020-12-07: 50 mL via INTRAVENOUS

## 2020-12-07 MED ORDER — PROPOFOL 10 MG/ML IV BOLUS
INTRAVENOUS | Status: DC | PRN
  Administered 2020-12-07: 30 mg via INTRAVENOUS
  Administered 2020-12-07: 50 ug/kg/min via INTRAVENOUS

## 2020-12-07 MED ORDER — SODIUM CHLORIDE 0.9 % IV SOLN: INTRAVENOUS | Status: DC

## 2020-12-07 MED ORDER — CHLORHEXIDINE GLUCONATE 0.12 % MT SOLN: 15.0000 mL | Freq: Once | OROMUCOSAL | Status: DC

## 2020-12-07 MED ORDER — LIDOCAINE HCL (PF) 1 % IJ SOLN
INTRAMUSCULAR | Status: AC
  Filled 2020-12-07: qty 30

## 2020-12-07 MED ORDER — DEXTROSE 50 % IV SOLN
INTRAVENOUS | Status: AC
  Filled 2020-12-07: qty 50

## 2020-12-07 MED ORDER — DEXAMETHASONE SODIUM PHOSPHATE 4 MG/ML IJ SOLN
INTRAMUSCULAR | Status: DC | PRN
  Administered 2020-12-07: 4 mg via INTRAVENOUS

## 2020-12-07 MED ORDER — LACTATED RINGERS IV SOLN: INTRAVENOUS | Status: DC

## 2020-12-07 MED ORDER — PROPOFOL 10 MG/ML IV BOLUS
INTRAVENOUS | Status: AC
  Filled 2020-12-07: qty 20

## 2020-12-07 MED ORDER — DEXTROSE 50 % IV SOLN
25.0000 mL | Freq: Once | INTRAVENOUS | Status: AC
  Administered 2020-12-07: 25 mL via INTRAVENOUS

## 2020-12-07 MED ORDER — MIDAZOLAM HCL 5 MG/5ML IJ SOLN
INTRAMUSCULAR | Status: DC | PRN
  Administered 2020-12-07: 1 mg via INTRAVENOUS

## 2020-12-07 MED ORDER — FENTANYL CITRATE (PF) 100 MCG/2ML IJ SOLN
INTRAMUSCULAR | Status: DC | PRN
  Administered 2020-12-07 (×2): 25 ug via INTRAVENOUS

## 2020-12-07 MED ORDER — MIDAZOLAM HCL 2 MG/2ML IJ SOLN
INTRAMUSCULAR | Status: AC
  Filled 2020-12-07: qty 2

## 2020-12-07 MED ORDER — ORAL CARE MOUTH RINSE: 15.0000 mL | Freq: Once | OROMUCOSAL | Status: DC

## 2020-12-07 MED ORDER — ONDANSETRON HCL 4 MG/2ML IJ SOLN
INTRAMUSCULAR | Status: AC
  Filled 2020-12-07: qty 4

## 2020-12-07 MED ORDER — FENTANYL CITRATE (PF) 100 MCG/2ML IJ SOLN
INTRAMUSCULAR | Status: AC
  Filled 2020-12-07: qty 2

## 2020-12-07 MED ORDER — SODIUM CHLORIDE 0.9 % IV SOLN: INTRAVENOUS | Status: DC | PRN

## 2020-12-07 SURGICAL SUPPLY — 48 items
APPLICATOR CHLORAPREP 10.5 ORG (MISCELLANEOUS) ×2 IMPLANT
BAG DECANTER FOR FLEXI CONT (MISCELLANEOUS) ×2 IMPLANT
BIOPATCH RED 1 DISK 7.0 (GAUZE/BANDAGES/DRESSINGS) ×2 IMPLANT
CATH PALINDROME-P 19CM W/VT (CATHETERS) IMPLANT
CATH PALINDROME-P 23CM W/VT (CATHETERS) ×1 IMPLANT
COVER LIGHT HANDLE STERIS (MISCELLANEOUS) ×4 IMPLANT
COVER PROBE U/S 5X48 (MISCELLANEOUS) ×2 IMPLANT
COVER WAND RF STERILE (DRAPES) ×2 IMPLANT
DECANTER SPIKE VIAL GLASS SM (MISCELLANEOUS) ×4 IMPLANT
DERMABOND ADVANCED (GAUZE/BANDAGES/DRESSINGS) ×1
DERMABOND ADVANCED .7 DNX12 (GAUZE/BANDAGES/DRESSINGS) ×1 IMPLANT
DRAPE C-ARM FOLDED MOBILE STRL (DRAPES) ×2 IMPLANT
DRAPE CHEST BREAST 15X10 FENES (DRAPES) ×2 IMPLANT
DRSG SORBAVIEW 3.5X5-5/16 MED (GAUZE/BANDAGES/DRESSINGS) ×2 IMPLANT
ELECT REM PT RETURN 9FT ADLT (ELECTROSURGICAL) ×2
ELECTRODE REM PT RTRN 9FT ADLT (ELECTROSURGICAL) ×1 IMPLANT
GAUZE 4X4 16PLY RFD (DISPOSABLE) ×2 IMPLANT
GEL ULTRASOUND 20GR AQUASONIC (MISCELLANEOUS) ×2 IMPLANT
GLOVE BIO SURGEON STRL SZ 6.5 (GLOVE) ×2 IMPLANT
GLOVE BIOGEL PI IND STRL 6.5 (GLOVE) ×1 IMPLANT
GLOVE BIOGEL PI IND STRL 7.0 (GLOVE) ×2 IMPLANT
GLOVE BIOGEL PI INDICATOR 6.5 (GLOVE) ×1
GLOVE BIOGEL PI INDICATOR 7.0 (GLOVE) ×2
GOWN STRL REUS W/TWL LRG LVL3 (GOWN DISPOSABLE) ×4 IMPLANT
GUIDEWIRE STR DUAL SENSOR (WIRE) ×1 IMPLANT
GUIDEWIRE STR ZIPWIRE 035X150 (MISCELLANEOUS) ×1 IMPLANT
IV CONNECTOR ONE LINK NDLESS (IV SETS) IMPLANT
IV NS 500ML (IV SOLUTION) ×2
IV NS 500ML BAXH (IV SOLUTION) ×1 IMPLANT
KIT BLADEGUARD II DBL (SET/KITS/TRAYS/PACK) ×2 IMPLANT
KIT PALINDROME-P 55CM (CATHETERS) IMPLANT
KIT TURNOVER KIT A (KITS) ×2 IMPLANT
MARKER SKIN DUAL TIP RULER LAB (MISCELLANEOUS) ×2 IMPLANT
NDL HYPO 18GX1.5 BLUNT FILL (NEEDLE) ×1 IMPLANT
NDL HYPO 25X1 1.5 SAFETY (NEEDLE) ×1 IMPLANT
NEEDLE HYPO 18GX1.5 BLUNT FILL (NEEDLE) ×2 IMPLANT
NEEDLE HYPO 25X1 1.5 SAFETY (NEEDLE) ×2 IMPLANT
PACK BASIC III (CUSTOM PROCEDURE TRAY) ×2
PACK SRG BSC III STRL LF ECLPS (CUSTOM PROCEDURE TRAY) ×1 IMPLANT
PAD ARMBOARD 7.5X6 YLW CONV (MISCELLANEOUS) ×2 IMPLANT
PENCIL SMOKE EVACUATOR COATED (MISCELLANEOUS) ×2 IMPLANT
SET BASIN LINEN APH (SET/KITS/TRAYS/PACK) ×2 IMPLANT
SUT MNCRL AB 4-0 PS2 18 (SUTURE) ×2 IMPLANT
SUT SILK 2 0 FSL 18 (SUTURE) ×2 IMPLANT
SUT VIC AB 3-0 SH 27 (SUTURE) ×2
SUT VIC AB 3-0 SH 27X BRD (SUTURE) ×1 IMPLANT
SYR 10ML LL (SYRINGE) ×4 IMPLANT
SYR CONTROL 10ML LL (SYRINGE) ×2 IMPLANT

## 2020-12-07 NOTE — TOC Progression Note (Addendum)
Transition of Care [facility]) - Progression Note    Patient Details  Name: [name] MRN: [id number] Date of Birth: 11-11-1951  Transition of Care [facility]) CM/SW Contact  Shade Flood, LCSW Phone Number: 12/07/2020, 1:40 PM  Clinical Narrative:     TOC following. Spoke with SW at [facility] two times today so far. They have not yet found an MD to accept pt and so they cannot proceed with getting pt on the schedule. Contacted Tina at [facility] and she states that they have filled the last bed already. She states that they would likely have availability Monday or Tuesday next week if conditions are met for pt to discharge. Pt will need new Hep B panel labs for the HD Center. MD updated.  Assigned TOC will follow up Monday.  Expected Discharge Plan: Skilled Nursing Facility Barriers to Discharge: Waiting for outpatient dialysis  Expected Discharge Plan and Services Expected Discharge Plan: [place]                                               Social Determinants of Health (SDOH) Interventions    Readmission Risk Interventions No flowsheet data found.

## 2020-12-07 NOTE — Progress Notes (Signed)
[facility]  Patient had no family requested for me to call. Catheter exchanged and good blood flow back and flush.  Neck had some kinking due to prior tunnel but this was opened up and improved.   Diet reordered.  Curlene Labrum, MD [facility] [address] [place], [place] [address] [phone] (office)

## 2020-12-07 NOTE — Op Note (Signed)
Operative Note 12/07/20   Preoperative Diagnosis: End Stage Renal Disease, Malfunctioning Right Internal Jugular Tunneled Dialysis Catheter    Postoperative Diagnosis: Same   Procedure(s) Performed:  Exchange over Wire of Tunneled Dialysis Catheter Placement, Right Internal Jugular Palindrome 23cm    Surgeon: Lanell Matar. Constance Haw, MD   Assistants: No qualified resident was available   Anesthesia: Monitored anesthesia care   Anesthesiologist: Louann Sjogren, MD    Specimens: None   Estimated Blood Loss: Minimal   Fluoroscopy time: 49 seconds   Blood Replacement: None    Complications: None    Operative Findings:  High venotomy on jugular and tunnel with kink on replacement catheter, tunneled opened up with hemostat with some improvement in curve at neck   Indications:  Mr. Sproule is a 70 yo with ESRD on dialysis who had a previously placed tunneled right internal jugular that is malfunctioning. He had this placed at another facility.  We discussed exchange over wire and risk of bleeding, infection, injury to vessels, need for new stick and pneumothorax.  He opted to proceed.   Procedure: The patient was brought into the operating room and monitored anesthesia care was induced.   The right chest and neck was prepped and draped including the tunneled catheter in the usual sterile fashion.  Preoperative antibiotics were given.   One percent lidocaine was used for local anesthesia around the catheter tunnel up to the neck.  The patient was measured and a 23 cm Palindrome dual lumen dialysis catheter was chosen. The previous catheter flushed but blood could not be blood back on the arterial port.   Using a hydrophilic 3.557 cm Zipwire, I assessed the previous catheter, and fluoroscopy confirmed that we were in the vein past the catheter. With great care the catheter was withdrawn after the cuff was freed using Metzenbaum scissors.  Pressure was held in the neck over the venotomy site from prior.   The new catheter had been prepped and the Venatrac stylets were in place.  The glide wire was placed through the stylets and treaded up through the catheter with care. The catheter was then carefully brought over the wire into the neck using fluoroscopy. The angle of the tunnel at the neck is quite steep from the prior venotomy where it is high on the neck. This was traversed and the catheter was placed in good position in the superior vena cava /atrial junction. Fluoroscopy confirmed this.  The neck was investigated and there was a kink. A small incision was made over the catheter at the neck and the tunnel was stretched to allow for a more gentle curve into the neck. I was able to get somewhat better positioning in the neck but this could prove to be a problem. Final fluoroscopy revealed good position and gentle curve in the neck.     The needles advanced into the right internal jugular vein using the Seldinger technique without difficulty.  A guidewire was then advanced into the right atrium under fluoroscopic guidance. The catheter drew back and flushed easily. The lumens were packed with heparin. Hemostats were used to position the catheter in the neck incision. The neck incision was closed with 4-0 Monocryl and Dermabond. The catheter was secured with 2-0 silk suture and a sterile Biopatch and dressing was applied.  Hemostasis was confirmed.     All tape and needle counts were correct at the end of the procedure. The patient was transferred to PACU in stable condition. A chest x-ray will  be performed at that time.  Curlene Labrum, MD [facility] [address] [place], [place] [address] [phone] (office)

## 2020-12-07 NOTE — Progress Notes (Signed)
Patient ID: [name], male   DOB: 1951-03-15, 70 y.o.   MRN: [id number] [facility] Progress Note   Assessment/ Plan:   1.  Recurrent syncope with orthostatic hypotension/autonomic dysfunction: Minimal ultrafiltration with dialysis so as not to exacerbate symptoms with ongoing support using midodrine, fludrocortisone and pyridostigmine.  He is also on external compression devices with abdominal binder and compression stockings.  Case management is assisting with [facility] to [facility] for stretcher dialysis (apparently a referral will be sent from [facility] as he was an established patient there).  He will be discharged to [facility], SNF at [facility] upon discharge. -recommend liberalizing sodium/solute intake  2. ESRD: Continue with hemodialysis on a Monday/Wednesday/Friday schedule possibly in recliner (that has the option of laying him flat should he get symptomatically hypotensive) with no ultrafiltration.  Process underway for placement to the hemodialysis unit that can accommodate stretcher dialysis. If still here next week, then will consult [facility] for access placement 4. CKD-MBD: Calcium and phosphorus level currently at goal without phosphorus binder.  Remains on calcitriol for PTH control. 5. Nutrition: With evidence of protein calorie malnutrition likely exacerbated by recent episode of colitis.  Status post ciprofloxacin and metronidazole. 6.  Colitis: Suspected to be infectious and status post treatment with ciprofloxacin and metronidazole.  Diarrhea improving. 7. Hyponatremia, stable: likely related to orthostatic hypotension, on 137Na bath, advised patient to liberalize sodium/solute intake esp for orthostatic hypotension 8. Access: sluggish flows in RIJ TDC despite TPA, catheter exchange today, appreciate assistance from Dr. Constance Haw. Consult VVS for AVF/AVG placement if patient is still here next week  Subjective:   No acute events. Having bad pain at  the site of his pressure ulcer which makes it problematic for him to sit in the recliner. Catheter exchange today   Objective:   BP 106/64   Pulse 72   Temp 97.7 F (36.5 C)   Resp 13   Ht 6\' 2"  (1.88 m)   Wt 64.4 kg   SpO2 100%   BMI 18.23 kg/m   Physical Exam: Gen: nad, comfortable, laying flat in bed CVS: Pulse regular rhythm, normal rate, S1 and S2 normal Resp: Clear to auscultation bilaterally without any rales/rhonchi.   Abd: Soft, flat, nontender, bowel sounds normal Ext: no edema Neuro: speech clear and coherent, alert and oriented, moves all extremities spontaneously Access: Right IJ TDC in place (high venotomy site)  Labs: BMET Recent Labs  Lab 12/01/20 0658 12/02/20 0557 12/03/20 0509 12/04/20 0500  NA 131* 126* 129* 131*  K 3.0* 3.4* 3.3* 3.2*  CL 97* 95* 95* 96*  CO2 25 23 22 24   GLUCOSE 88 130* 95 98  BUN 35* 44* 52* 29*  CREATININE 4.51* 5.55* 6.17* 4.15*  CALCIUM 7.7* 7.9* 7.8* 7.6*  PHOS 3.1 4.2 5.0* 3.3   CBC Recent Labs  Lab 12/03/20 0509  WBC 11.6*  HGB 8.1*  HCT 26.1*  MCV 95.6  PLT 269     Medications:    . [MAR Hold] amiodarone  200 mg Oral Daily  . [MAR Hold] calcitRIOL  0.5 mcg Oral Daily  . chlorhexidine  15 mL Mouth/Throat Once   Or  . mouth rinse  15 mL Mouth Rinse Once  . chlorhexidine  15 mL Mouth/Throat Once   Or  . mouth rinse  15 mL Mouth Rinse Once  . [MAR Hold] Chlorhexidine Gluconate Cloth  6 each Topical Q0600  . [MAR Hold] Chlorhexidine Gluconate Cloth  6 each Topical Q0600  . [  MAR Hold] collagenase   Topical Daily  . [MAR Hold] darbepoetin (ARANESP) injection - DIALYSIS  200 mcg Intravenous Q Mon-HD  . [MAR Hold] famotidine  20 mg Oral Daily  . [MAR Hold] feeding supplement  237 mL Oral BID BM  . [MAR Hold] finasteride  5 mg Oral Daily  . [MAR Hold] fludrocortisone  0.2 mg Oral Daily  . [MAR Hold] metoprolol tartrate  12.5 mg Oral QHS  . [MAR Hold] midodrine  10 mg Oral TID with meals  . [MAR Hold]  multivitamin  1 tablet Oral QHS  . [MAR Hold] pantoprazole  40 mg Oral BID AC  . [MAR Hold] psyllium  1 packet Oral QHS  . [MAR Hold] pyridostigmine  60 mg Oral Daily  . [MAR Hold] simvastatin  20 mg Oral QPM  . [MAR Hold] sodium chloride flush  3 mL Intravenous Q12H

## 2020-12-07 NOTE — Anesthesia Preprocedure Evaluation (Signed)
Anesthesia Evaluation  Patient identified by MRN, date of birth, ID band Patient awake    Reviewed: Allergy & Precautions, H&P , NPO status , Patient's Chart, lab work & pertinent test results, reviewed documented beta blocker date and time   Airway Mallampati: II  TM Distance: >3 FB Neck ROM: full    Dental no notable dental hx.    Pulmonary neg pulmonary ROS,    Pulmonary exam normal breath sounds clear to auscultation       Cardiovascular Exercise Tolerance: Good negative cardio ROS   Rhythm:regular Rate:Normal     Neuro/Psych  Neuromuscular disease negative psych ROS   GI/Hepatic negative GI ROS, Neg liver ROS,   Endo/Other  negative endocrine ROSdiabetes  Renal/GU negative Renal ROS  negative genitourinary   Musculoskeletal   Abdominal   Peds  Hematology  (+) Blood dyscrasia, anemia ,   Anesthesia Other Findings   Reproductive/Obstetrics negative OB ROS                             Anesthesia Physical Anesthesia Plan  ASA: III  Anesthesia Plan: General   Post-op Pain Management:    Induction:   PONV Risk Score and Plan: Propofol infusion  Airway Management Planned:   Additional Equipment:   Intra-op Plan:   Post-operative Plan:   Informed Consent: I have reviewed the patients History and Physical, chart, labs and discussed the procedure including the risks, benefits and alternatives for the proposed anesthesia with the patient or authorized representative who has indicated his/her understanding and acceptance.     Dental Advisory Given  Plan Discussed with: CRNA  Anesthesia Plan Comments:         Anesthesia Quick Evaluation

## 2020-12-07 NOTE — Procedures (Signed)
   NEPHROLOGY NURSING NOTE:  TOC team have advised that pt will not be able to discharge today due to pending outpatient HD arrangements.  As pt is s/p new TDC placement and there is a high HD pt census today, Dr. Candiss Norse has r/s'd pt's treatment for tomorrow, Saturday.  Will resume MWF schedule next week.  Rockwell Alexandria, RN

## 2020-12-07 NOTE — Transfer of Care (Signed)
Immediate Anesthesia Transfer of Care Note  Patient: [name]  Procedure(s) Performed: INSERTION OF DIALYSIS CATHETER (Right )  Patient Location: PACU  Anesthesia Type:General  Level of Consciousness: awake, alert , oriented and patient cooperative  Airway & Oxygen Therapy: Patient Spontanous Breathing and Patient connected to nasal cannula oxygen  Post-op Assessment: Report given to RN, Post -op Vital signs reviewed and stable and Patient moving all extremities  Post vital signs: Reviewed and stable  Last Vitals:  Vitals Value Taken Time  BP    Temp    Pulse    Resp    SpO2      Last Pain:  Vitals:   12/07/20 1034  TempSrc: Oral  PainSc: 0-No pain      Patients Stated Pain Goal: 5 (90/30/14 9969)  Complications: No complications documented.

## 2020-12-07 NOTE — Anesthesia Postprocedure Evaluation (Signed)
Anesthesia Post Note  Patient: [name]  Procedure(s) Performed: INSERTION OF DIALYSIS CATHETER (Right )  Patient location during evaluation: PACU Anesthesia Type: General Level of consciousness: awake, oriented, awake and alert and patient cooperative Pain management: pain level controlled Vital Signs Assessment: post-procedure vital signs reviewed and stable Respiratory status: respiratory function stable, spontaneous breathing and nonlabored ventilation Cardiovascular status: blood pressure returned to baseline and stable Postop Assessment: no headache and no backache Anesthetic complications: no   No complications documented.   Last Vitals:  Vitals:   12/07/20 0437 12/07/20 1034  BP: 114/64 112/63  Pulse: 72 76  Resp: 16 15  Temp: 36.4 C 36.9 C  SpO2: 97% 96%    Last Pain:  Vitals:   12/07/20 1034  TempSrc: Oral  PainSc: 0-No pain                 Tacy Learn

## 2020-12-07 NOTE — Care Management Important Message (Signed)
Important Message  Patient Details  Name: [name] MRN: [id number] Date of Birth: [birth year]   Medicare Important Message Given:  Yes - Important Message mailed due to current National Emergency     [name] 12/07/2020, 2:27 PM

## 2020-12-07 NOTE — Interval H&P Note (Signed)
History and Physical Interval Note:  12/07/2020 11:05 AM  [name]  has presented today for surgery, with the diagnosis of malfunctioning catheter.  The various methods of treatment have been discussed with the patient and family. After consideration of risks, benefits and other options for treatment, the patient has consented to  Procedure(s) with comments: INSERTION OF DIALYSIS CATHETER (Right) - Exhange of catheter over wire versus new catheter as a surgical intervention.  The patient's history has been reviewed, patient examined, no change in status, stable for surgery.  I have reviewed the patient's chart and labs.  Questions were answered to the patient's satisfaction.    No questions. No person to call following procedure.  Virl Cagey

## 2020-12-07 NOTE — Progress Notes (Signed)
PROGRESS NOTE  [name] QVZ:[id number] DOB: 1951-01-24 DOA: 11/02/2020 PCP: Patient, No Pcp Per  Brief History: 70yo male with history of diabetes mellitus, CKD stage V started recently on dialysis Monday Wednesday Friday and still making urine, recent A. fib and was restarted on amiodarone metoprolol and Eliquis was sent fromSNFafter he was noted to have an episode of syncope while getting up to go to hemodialysis.On arrival to the ED his systolic blood pressures were in the 70 mmHg range. He is noted to have some diarrhea, but he has recently completed course of ciprofloxacin and Flagyl by 11/23. He was noted to have nonspecific colitis on pathology after recent colonoscopy on 11/17 and was started on prednisone taper which he continues to take at this time.He was just discharged after admission from 10/12/20 to 11/01/20 when he was treated for syncope from orthostasis as well as colitis as discussed above. He also required 5 units PRBC last admission. Hewas started on Solu-Medrol 60 mg daily 11/19--per GI>>po 40 mg daily--wean by 10 mg per week--started30 mg daily on 11/02/20. He is now finished with steroids  ED Course:Patient had received 250 mL of normal saline fluid bolus with improvement in blood pressure readings. Manual blood pressures confirm systolics in the low 329 range and heart rates fluctuate in the low 100 range. He denies any palpitations or chest pains or shortness of breath. Laboratory data with stable hemoglobin levels noted and he is noted to be hypokalemic with potassium 2.9 and he has a lactic acid level 2.6.   Assessment/Plan: Orthostatic hypotension -continues to be orthostatic despite pyridostigmine, midodrine and florinef -this lead to repeat admission after d/c on 11/01/20 -10/13/20--EchoEF 51-88%CZYSA 1 diastolic dysfunction -addedabdominal binder and knee stockings -decrease metoprolol to once daily -am cortisol 23.1 -TSH  1.912 -not a candidate for Northera -case discussed with renal, Dr. Libby [name] d/c with stretcher HD until able to tolerate sitting and standing -11/30/20--discussed case with [name], Dr. Ace [name] additional to offer, agrees with our current treatment -OOB with each shift; Check orthostatic daily--remains orthostatic  Chronic Diarrhea/Colitis -presented with diarrhea and LGIB last admission in Nov 2021 -improving with imodium -initially treated with cip/fl and then 4 weeks of steroid taper -10/15/20, 11/06/20 and12/22/21 GI pathogen panel neg -11/02/20 Cdiff neg -11/05/20 Stool O&P--neg -GI following -10/17/20 colonoscopy-area of moderately congested mucosa with ulceration was found in the sigmoid colon and in the descending colon.Bllood noted in rectum, sigmoid and descending colon -11/21/20 colonoscopy--polyps in asc colon & 2 polyps hepatic flexure, extensive circumferential ulcers in distal sigmoid -11/17/21Biopsies of rectum and sigmoid colon show active nonspecific colitis w/ ulceration c/f medication induced (NSAIDs), ischemia, or stercoral proctitis -12//22/21 TI and colon biopsies--neg for inflammation; +tubular adenmoa -Fecal elastase and qualitative fecal fat analysisordered by GI unrevealing -11/30/20--discussed case with [name], Dr. Ace [name] additional to offer, agrees with our current treatment  Anemia of CKD -had rectal bleed last admit requiring 5 units PRBC -11/21/20--received 1 unit this admit -multifactorial including CKD, blood draws, chronic disease -iron studies consistent with ACD  ESRD -Biopsy results from outpatient nephrologist moderate to severe arterial nephrosclerosis with 75% global glomerulosclerosis, focal and segmental glomerular atrophic scarring and diffuse moderate to severe tubulointerstitial scarring --started recently on dialysis MWF --HD on 12/28--unable to remove fluid --having intradialytic hypotension already on  midodrine-->having difficulty removing fluid -discussed with [name] to position in sitting position for HD -TDC replaced 12/07/20 by Dr. Constance [name]  Recent paroxysmal A.  fib --started on amiodaronelast admission --Eliquis--holdingdue to his rectal bleeding and acute blood loss anemia --Patient understands risks and benefits of holding anticoagulation and he seems to be reluctant to start it again-we will need close follow-up with his cardiologist/PCP. --metoprolol stopped due to orthostasis initially --started metoprolol 12.5 mg bid as pt having more afib RVR episodesfrom last admission>>decreased to once daily due to orthostatic hypotension  Hyponatremia, --sodiumis low continue to adjust dialysis.  Hyperlipidemia: --Continue statins.  Acute urinary retention- s/p foley placement 12/16. Foley removed 12/23 and he had been voiding. Unfortunately he developed acute urinary retention again on 12/25 and foley was replaced with >900 mL urine relieved. Will leave foley in place for now until he can follow up outpatient with urology.  Intermittent Hematuria/CAUTI -likely due to foley trauma and intermittent movement/UTI -anchor foley -am CBC--Hgb stable -UA>50WBC -urine culture= serratia -completed course of ceftriaxone  Goals of Care -long discussion with patient's daughter at bedside 11/29/20 Advance care planning, including the explanation and discussion of advance directives was carried out with the patient and family. Code status including explanations of "Full Code" and "DNR" and alternatives were discussed in detail. Discussion of end-of-life issues including but not limited palliative care, hospice care and the concept of hospice, other end-of-life care options, power of attorney for health care decisions, living wills, and physician orders for life-sustaining treatment were also discussed with the patient and family. Total face to face time 36  minutes. -patient wants to continue full scope of care -12/02/19--confirms full scope of care; Plan to d/c to SNF and stretcher HD facility -12/04/19--[name] appears to have little motivation--refuses to sit up in chair; minimal effort with PT and intermittently refuses PT -ultimate plan is to find HD facility which will accomodate "stetcher dialysis"  Status is: Inpatient  Remains inpatient appropriate because:IV treatments appropriate due to intensity of illness or inability to take PO Remains orthostatic putting him at high risk for re-admission  We are awaiting for placement at SNF that can transport him for stretcher HD treatments.  At this time the hold up is getting an accepting MD nephrologist in [facility] area.  He lost SNF bed 1/7 but we are told m  Dispo: Patient From: [facility] Planned Disposition: [facility] Expected discharge date: 12/11/2020 Medically stable for discharge: No  Family Communication: daughter updated 12/01/20  significant other updated 12/02/20  Consultants: renal, GI, palliative  Code Status: FULL   DVT Prophylaxis: SCDs   Procedures: As Listed in Progress Note Above  Antibiotics: None  Subjective:  Patient tolerated TDC replacement with no problems.   Objective: Vitals:   12/07/20 1034 12/07/20 1240 12/07/20 1245 12/07/20 1302  BP: 112/63 105/65 106/64 125/67  Pulse: 76 70 72 71  Resp: 15 13 13 17   Temp: 98.4 F (36.9 C) 97.7 F (36.5 C)    TempSrc: Oral     SpO2: 96% 95% 100% 98%  Weight:      Height:        Intake/Output Summary (Last 24 hours) at 12/07/2020 1352 Last data filed at 12/07/2020 1305 Gross per 24 hour  Intake 1170 ml  Output 500 ml  Net 670 ml   Weight change: -0.6 kg Exam:   General:  Pt is alert, follows commands appropriately, not in acute distress, affect less flat today  HEENT: No icterus, No thrush, No neck mass,  Talent/AT  Cardiovascular: normal S1/S2, no rubs, no gallops  Respiratory: bibasilar rales. No wheeze  Abdomen: Soft/+BS,  non tender, non distended, no guarding  Extremities: No edema, No lymphangitis, No petechiae, No rashes, no synovitis  Data Reviewed: I have personally reviewed following labs and imaging studies Basic Metabolic Panel: Recent Labs  Lab 12/01/20 0658 12/02/20 0557 12/03/20 0509 12/04/20 0500  NA 131* 126* 129* 131*  K 3.0* 3.4* 3.3* 3.2*  CL 97* 95* 95* 96*  CO2 25 23 22 24   GLUCOSE 88 130* 95 98  BUN 35* 44* 52* 29*  CREATININE 4.51* 5.55* 6.17* 4.15*  CALCIUM 7.7* 7.9* 7.8* 7.6*  PHOS 3.1 4.2 5.0* 3.3   Liver Function Tests: Recent Labs  Lab 12/01/20 0658 12/02/20 0557 12/03/20 0509 12/04/20 0500  ALBUMIN 2.0* 2.0* 2.0* 2.2*   No results for input(s): LIPASE, AMYLASE in the last 168 hours. No results for input(s): AMMONIA in the last 168 hours. Coagulation Profile: No results for input(s): INR, PROTIME in the last 168 hours. CBC: Recent Labs  Lab 12/03/20 0509  WBC 11.6*  HGB 8.1*  HCT 26.1*  MCV 95.6  PLT 269   Cardiac Enzymes: No results for input(s): CKTOTAL, CKMB, CKMBINDEX, TROPONINI in the last 168 hours. BNP: Invalid input(s): POCBNP CBG: Recent Labs  Lab 12/07/20 1048 12/07/20 1113 12/07/20 1241  GLUCAP 66* 111* 87   HbA1C: No results for input(s): HGBA1C in the last 72 hours. Urine analysis:    Component Value Date/Time   COLORURINE AMBER (A) 12/01/2020 1750   APPEARANCEUR CLOUDY (A) 12/01/2020 1750   LABSPEC 1.008 12/01/2020 1750   PHURINE 6.0 12/01/2020 1750   GLUCOSEU NEGATIVE 12/01/2020 1750   HGBUR LARGE (A) 12/01/2020 1750   BILIRUBINUR NEGATIVE 12/01/2020 1750   KETONESUR NEGATIVE 12/01/2020 1750   PROTEINUR 100 (A) 12/01/2020 1750   NITRITE NEGATIVE 12/01/2020 1750   LEUKOCYTESUR LARGE (A) 12/01/2020 1750    Recent Results (from the past 240 hour(s))  Culture, Urine     Status: Abnormal   Collection  Time: 12/01/20  5:50 PM   Specimen: Urine, Catheterized  Result Value Ref Range Status   Specimen Description   Final    URINE, CATHETERIZED Performed at [facility], [address], [place], [place] [address]    Special Requests   Final    NONE Performed at [facility], [address] [place],  [address]    Culture >=100,000 COLONIES/mL SERRATIA MARCESCENS (A)  Final   Report Status 12/04/2020 FINAL  Final   Organism ID, Bacteria SERRATIA MARCESCENS (A)  Final      Susceptibility   Serratia marcescens - MIC*    CEFAZOLIN >=64 RESISTANT Resistant     CEFEPIME <=0.12 SENSITIVE Sensitive     CEFTRIAXONE 1 SENSITIVE Sensitive     CIPROFLOXACIN 2 INTERMEDIATE Intermediate     GENTAMICIN <=1 SENSITIVE Sensitive     NITROFURANTOIN 256 RESISTANT Resistant     TRIMETH/SULFA <=20 SENSITIVE Sensitive     * >=100,000 COLONIES/mL SERRATIA MARCESCENS  SARS CORONAVIRUS 2 (TAT 6-24 HRS) Nasopharyngeal Nasopharyngeal Swab     Status: None   Collection Time: 12/06/20  4:50 PM   Specimen: Nasopharyngeal Swab  Result Value Ref Range Status   SARS Coronavirus 2 NEGATIVE NEGATIVE Final    Comment: (NOTE) SARS-CoV-2 target nucleic acids are NOT DETECTED.  The SARS-CoV-2 RNA is generally detectable in upper and lower respiratory specimens during the acute phase of infection. Negative results do not preclude SARS-CoV-2 infection, do not rule out co-infections with other pathogens, and should not be used as the sole basis for treatment or  other patient management decisions. Negative results must be combined with clinical observations, patient history, and epidemiological information. The expected result is Negative.  Fact Sheet for Patients: SugarRoll.be  Fact Sheet for Healthcare Providers: https://www.woods-mathews.com/  This test is not yet approved or cleared by the [place] FDA and  has been authorized for detection and/or diagnosis of  SARS-CoV-2 by FDA under an Emergency Use Authorization (EUA). This EUA will remain  in effect (meaning this test can be used) for the duration of the COVID-19 declaration under Se ction 564(b)(1) of the Act, 21 U.S.C. section 360bbb-3(b)(1), unless the authorization is terminated or revoked sooner.  Performed at [facility], [place] [address], [place], [place] [address]   Surgical pcr screen     Status: None   Collection Time: 12/07/20  6:08 AM   Specimen: Nasal Mucosa; Nasal Swab  Result Value Ref Range Status   MRSA, PCR NEGATIVE NEGATIVE Final   Staphylococcus aureus NEGATIVE NEGATIVE Final    Comment: (NOTE) The Xpert SA Assay (FDA approved for NASAL specimens in patients 20 years of age and older), is one component of a comprehensive surveillance program. It is not intended to diagnose infection nor to guide or monitor treatment. Performed at [facility], [address] [place], [place] [address]      Scheduled Meds: . amiodarone  200 mg Oral Daily  . calcitRIOL  0.5 mcg Oral Daily  . Chlorhexidine Gluconate Cloth  6 each Topical Q0600  . Chlorhexidine Gluconate Cloth  6 each Topical Q0600  . collagenase   Topical Daily  . darbepoetin (ARANESP) injection - DIALYSIS  200 mcg Intravenous Q Mon-HD  . famotidine  20 mg Oral Daily  . feeding supplement  237 mL Oral BID BM  . finasteride  5 mg Oral Daily  . fludrocortisone  0.2 mg Oral Daily  . metoprolol tartrate  12.5 mg Oral QHS  . midodrine  10 mg Oral TID with meals  . multivitamin  1 tablet Oral QHS  . pantoprazole  40 mg Oral BID AC  . psyllium  1 packet Oral QHS  . pyridostigmine  60 mg Oral Daily  . simvastatin  20 mg Oral QPM  . sodium chloride flush  3 mL Intravenous Q12H   Continuous Infusions: . sodium chloride    . sodium chloride    . sodium chloride      Procedures/Studies: MR BRAIN WO CONTRAST  Result Date: 11/16/2020 CLINICAL DATA:  Question deposition disease. Hypotension. Altered mental  status. EXAM: MRI HEAD WITHOUT CONTRAST TECHNIQUE: Multiplanar, multiecho pulse sequences of the brain and surrounding structures were obtained without intravenous contrast. COMPARISON:  Head CT 01/25/2020 FINDINGS: Brain: Diffusion imaging does not show any acute or subacute infarction. No focal abnormality affects brainstem or cerebellum. Cerebral hemispheres show mild age related volume loss without evidence of small-vessel disease or large vessel infarction. No mass lesion, hemorrhage, hydrocephalus or extra-axial collection. Vascular: Major vessels at the base of the brain show flow. Skull and upper cervical spine: Negative Sinuses/Orbits: Clear/normal Other: None IMPRESSION: No acute or reversible finding. Mild age related volume loss. No evidence of small-vessel disease or large vessel infarction. Electronically Signed   By: Nelson Chimes M.D.   On: 11/16/2020 14:01   DG Chest Port 1 View  Result Date: 12/07/2020 CLINICAL DATA:  Post RIGHT-side dialysis catheter exchange EXAM: PORTABLE CHEST 1 VIEW COMPARISON:  Portable exam 1240 hours compared to 1003 hours FINDINGS: RIGHT jugular dual-lumen central venous catheter with tip  projecting over SVC. Normal heart size, mediastinal contours, and pulmonary vascularity. Atherosclerotic calcification aorta. Minimal chronic peribronchial thickening without pulmonary infiltrate, pleural effusion, or pneumothorax. Bones mildly demineralized. IMPRESSION: No pneumothorax following RIGHT jugular line placement. Minimal persistent bronchitic changes. Aortic Atherosclerosis (ICD10-I70.0). Electronically Signed   By: Lavonia Dana M.D.   On: 12/07/2020 12:55   DG Chest Port 1 View  Result Date: 12/06/2020 CLINICAL DATA:  End-stage renal disease. EXAM: PORTABLE CHEST 1 VIEW COMPARISON:  October 29, 2020. FINDINGS: The heart size and mediastinal contours are within normal limits. Both lungs are clear. Right internal jugular dialysis catheter is unchanged in position with  distal tip in expected position of cavoatrial junction. The visualized skeletal structures are unremarkable. IMPRESSION: No active disease. Electronically Signed   By: Marijo Conception M.D.   On: 12/06/2020 10:34   DG Abd Portable 1V  Result Date: 11/28/2020 CLINICAL DATA:  Abdominal distension abdominal pain EXAM: PORTABLE ABDOMEN - 1 VIEW COMPARISON:  November 07, 2020 CT assessment, abdominal plain film from November 15, 2020 FINDINGS: Decreased distension of bowel loops in the abdomen when compared to the study from November 15, 2020. Mild distension currently of scattered gas-filled loops of small bowel. Suggestion of small amount of proximal rectal gas. No acute skeletal process on limited assessment. Signs of basilar atelectasis. IMPRESSION: Decreased distension of bowel loops in the abdomen when compared to the study from November 15, 2020. Mild distension currently of scattered gas-filled loops of small bowel may reflect mild ileus. Suggestion of small amount of proximal rectal gas. Electronically Signed   By: Zetta Bills M.D.   On: 11/28/2020 16:15   DG Abd Portable 1V  Result Date: 11/15/2020 CLINICAL DATA:  Abdominal distension EXAM: PORTABLE ABDOMEN - 1 VIEW COMPARISON:  10/25/2020 FINDINGS: The subdiaphragmatic region and right flank are excluded from view. Multiple gas-filled prominent loops of large and small bowel are seen throughout the visualized abdomen in keeping with un underlying ileus. No gross free intraperitoneal gas. Gas and stool is seen within the rectal vault. No organomegaly. No acute bone abnormality. IMPRESSION: Mild ileus. Electronically Signed   By: Fidela Salisbury MD   On: 11/15/2020 04:15   DG C-Arm 1-60 Min-No Report  Result Date: 12/07/2020 Fluoroscopy was utilized by the requesting physician.  No radiographic interpretation.   CT Angio Abd/Pel w/ and/or w/o  Result Date: 11/07/2020 CLINICAL DATA:  Evaluate for mesenteric ischemia. End-stage renal disease on  dialysis. Bowel wall thickening on exam from 10/12/2020. EXAM: CTA ABDOMEN AND PELVIS WITHOUT AND WITH CONTRAST TECHNIQUE: Multidetector CT imaging of the abdomen and pelvis was performed using the standard protocol during bolus administration of intravenous contrast. Multiplanar reconstructed images and MIPs were obtained and reviewed to evaluate the vascular anatomy. CONTRAST:  62mL OMNIPAQUE IOHEXOL 350 MG/ML SOLN COMPARISON:  10/22/2020 FINDINGS: VASCULAR Aorta: Mild atherosclerotic disease in the abdominal aorta without aneurysm or dissection. Celiac: Celiac trunk is patent without significant stenosis. Focal dilatation of the trunk measures up to 9 mm. Main branch vessels are patent. SMA: Replaced right hepatic artery. SMA is widely patent without significant stenosis. No evidence for aneurysm or dissection. Renals: Main right renal artery is widely patent without stenosis, aneurysm or dissection. There is small accessory right renal artery. High-grade focal narrowing in the proximal main left renal artery without significant plaque in this area. Findings could be related to a dissection but indeterminate. Small accessory left renal artery is patent. IMA: Patent Inflow: Atherosclerotic disease involving the proximal left common  iliac artery without significant stenosis. Common iliac arteries are patent bilaterally. Disease and stenosis involving the right internal iliac artery. Left internal iliac artery is patent. Bilateral external iliac arteries are widely patent. Proximal Outflow: Proximal femoral arteries are patent bilaterally. Veins: Main portal venous system is patent. Limited evaluation of the superior cavoatrial junction on the arterial phase imaging due to non-opacified blood in this area. IVC and renal veins are patent. Narrowing of the left common iliac vein from the right common iliac artery is a normal anatomic variant. Review of the MIP images confirms the above findings. NON-VASCULAR Lower  chest: Small bilateral pleural effusions with compressive atelectasis. Hepatobiliary: High-density material in the gallbladder are suggestive for small stones. The gallbladder is moderately distended without definite inflammatory changes. There is colon anterior to the liver. No discrete liver lesion. No biliary dilatation. Gallbladder distension has minimally changed since the previous examination. Pancreas: Unremarkable. No pancreatic ductal dilatation or surrounding inflammatory changes. Spleen: Normal in size without focal abnormality. Adrenals/Urinary Tract: Normal appearance of the adrenal glands. Both kidneys are small with scattered calcifications. Findings are suggestive for nonobstructive renal calculi. Stomach/Bowel: Normal appearance of the stomach and duodenum. There is a rectal catheter with an inflated balloon. Sigmoid colon is moderately distended with mild wall thickening. However, the sigmoid wall thickening is less impressive on the venous phase imaging. Cecum is distended and contains a large amount of stool. Transverse colon is distended with gas. Lymphatic: No significant lymph node enlargement in the abdomen or pelvis. Reproductive: Stable appearance of the prostate. Other: Diffuse subcutaneous edema. Small amount of pelvic ascites which is new. Trace ascites in the left lower quadrant of the abdomen. Trace ascites in the upper abdomen. Negative for free air. Musculoskeletal: Chronic disc space narrowing with endplate changes at W1-U2. Stable disc space narrowing at L5-S1. IMPRESSION: VASCULAR 1. Atherosclerotic disease in the abdominal aorta without aneurysm or significant stenosis. Aortic Atherosclerosis (ICD10-I70.0). 2. Main mesenteric arteries are patent without significant stenosis. No evidence to suggest chronic mesenteric ischemia. 3. Multiple renal arteries as described. Focal narrowing the proximal left main renal artery that could be related to atherosclerotic disease or focal  dissection in this area. NON-VASCULAR 1. The sigmoid colon has been partially decompressed since 10/22/2020 and placement of the rectal tube. There continues to be gaseous distension of the sigmoid colon and transverse colon. Mild wall thickening in the sigmoid colon is nonspecific but may be related to the partial decompression rather than infectious or inflammatory process. Findings are suggestive for an underlying colonic ileus. 2. Bilateral kidneys are atrophic with bilateral calcifications. Findings are compatible with history of end-stage renal disease. Calcifications could represent nonobstructive renal calculi. 3. Diffuse subcutaneous edema with small amount of ascites, most prominent in the pelvis. 4. Significant disc space disease at L2-L3. 5. Bilateral pleural effusions with compressive atelectasis at the lung bases. 6. Cholelithiasis with moderate gallbladder distension. Electronically Signed   By: Markus Daft M.D.   On: 11/07/2020 17:58    Aolanis Crispen Wynetta Emery, MD  How to contact the [facility] Attending or Consulting provider [place] or covering provider during after hours [place], for this patient?  1. Check the care team in [facility] and look for a) attending/consulting [facility] provider listed and b) the [facility] team listed 2. Log into [email or web address] and use [facility]'s universal password to access. If you do not have the password, please contact the hospital operator. 3. Locate the [facility] provider you are looking for under [facility] and page  to a number that you can be directly reached. 4. If you still have difficulty reaching the provider, please page the [facility] (Director on Call) for the Hospitalists listed on amion for assistance.  12/07/2020, 1:52 PM   LOS: 35 days

## 2020-12-08 DIAGNOSIS — N186 End stage renal disease: Secondary | ICD-10-CM | POA: Diagnosis not present

## 2020-12-08 DIAGNOSIS — Z992 Dependence on renal dialysis: Secondary | ICD-10-CM | POA: Diagnosis not present

## 2020-12-08 DIAGNOSIS — I951 Orthostatic hypotension: Secondary | ICD-10-CM | POA: Diagnosis not present

## 2020-12-08 DIAGNOSIS — R140 Abdominal distension (gaseous): Secondary | ICD-10-CM | POA: Diagnosis not present

## 2020-12-08 MED ORDER — ALTEPLASE 2 MG IJ SOLR
3.8000 mg | Freq: Once | INTRAMUSCULAR | Status: AC
  Administered 2020-12-08: 3.8 mg

## 2020-12-08 NOTE — Progress Notes (Signed)
PROGRESS NOTE  [name] VCB:[id number] DOB: 12/10/1950 DOA: 11/02/2020 PCP: Patient, No Pcp Per  Brief History: 70yo male with history of diabetes mellitus, CKD stage V started recently on dialysis Monday Wednesday Friday and still making urine, recent A. fib and was restarted on amiodarone metoprolol and Eliquis was sent fromSNFafter he was noted to have an episode of syncope while getting up to go to hemodialysis.On arrival to the ED his systolic blood pressures were in the 70 mmHg range. He is noted to have some diarrhea, but he has recently completed course of ciprofloxacin and Flagyl by 11/23. He was noted to have nonspecific colitis on pathology after recent colonoscopy on 11/17 and was started on prednisone taper which he continues to take at this time.He was just discharged after admission from 10/12/20 to 11/01/20 when he was treated for syncope from orthostasis as well as colitis as discussed above. He also required 5 units PRBC last admission. Hewas started on Solu-Medrol 60 mg daily 11/19--per GI>>po 40 mg daily--wean by 10 mg per week--started30 mg daily on 11/02/20. He is now finished with steroids  ED Course:Patient had received 250 mL of normal saline fluid bolus with improvement in blood pressure readings. Manual blood pressures confirm systolics in the low 384 range and heart rates fluctuate in the low 100 range. He denies any palpitations or chest pains or shortness of breath. Laboratory data with stable hemoglobin levels noted and he is noted to be hypokalemic with potassium 2.9 and he has a lactic acid level 2.6.   Assessment/Plan: Orthostatic hypotension -continues to be orthostatic despite pyridostigmine, midodrine and florinef -this lead to repeat admission after d/c on 11/01/20 -10/13/20--EchoEF 66-59%DJTTS 1 diastolic dysfunction -addedabdominal binder and knee stockings -decrease metoprolol to once daily -am cortisol 23.1 -TSH  1.912 -not a candidate for Northera -case discussed with renal, [name] d/c with stretcher HD until able to tolerate sitting and standing -11/30/20--discussed case with [name], [name] additional to offer, agrees with our current treatment -OOB with each shift; Check orthostatic daily--remains orthostatic  Chronic Diarrhea/Colitis -presented with diarrhea and LGIB last admission in Nov 2021 -improving with imodium -initially treated with cip/fl and then 4 weeks of steroid taper -10/15/20, 11/06/20 and12/22/21 GI pathogen panel neg -11/02/20 Cdiff neg -11/05/20 Stool O&P--neg -GI following -10/17/20 colonoscopy-area of moderately congested mucosa with ulceration was found in the sigmoid colon and in the descending colon.Bllood noted in rectum, sigmoid and descending colon -11/21/20 colonoscopy--polyps in asc colon & 2 polyps hepatic flexure, extensive circumferential ulcers in distal sigmoid -11/17/21Biopsies of rectum and sigmoid colon show active nonspecific colitis w/ ulceration c/f medication induced (NSAIDs), ischemia, or stercoral proctitis -12//22/21 TI and colon biopsies--neg for inflammation; +tubular adenmoa -Fecal elastase and qualitative fecal fat analysisordered by GI unrevealing -11/30/20--discussed case with [name], [name] additional to offer, agrees with our current treatment  Anemia of CKD -had rectal bleed last admit requiring 5 units PRBC -11/21/20--received 1 unit this admit -multifactorial including CKD, blood draws, chronic disease -iron studies consistent with ACD  ESRD -Biopsy results from outpatient nephrologist moderate to severe arterial nephrosclerosis with 75% global glomerulosclerosis, focal and segmental glomerular atrophic scarring and diffuse moderate to severe tubulointerstitial scarring --started recently on dialysis MWF --HD on 12/28--unable to remove fluid --having intradialytic hypotension already on  midodrine-->having difficulty removing fluid -discussed with [name] to position in sitting position for HD -TDC replaced 12/07/20 by [name]  Recent paroxysmal A.  fib --started on amiodaronelast admission --Eliquis--holdingdue to his rectal bleeding and acute blood loss anemia --Patient understands risks and benefits of holding anticoagulation and he seems to be reluctant to start it again-we will need close follow-up with his cardiologist/PCP. --metoprolol stopped due to orthostasis initially --started metoprolol 12.5 mg bid as pt having more afib RVR episodesfrom last admission>>decreased to once daily due to orthostatic hypotension  Hyponatremia, --sodiumis improving with HD treatment.  Hyperlipidemia: --Continue statin.  Acute urinary retention- s/p foley placement 12/16. Foley removed 12/23 and he had been voiding. Unfortunately he developed acute urinary retention again on 12/25 and foley was replaced with >900 mL urine relieved. Will leave foley in place for now until he can follow up outpatient with urology.  Intermittent Hematuria/CAUTI -likely due to foley trauma and intermittent movement/UTI -anchor foley -am CBC--Hgb stable -UA>50WBC -urine culture= serratia -completed course of ceftriaxone  Goals of Care -long discussion with patient's daughter at bedside 11/29/20 Advance care planning, including the explanation and discussion of advance directives was carried out with the patient and family. Code status including explanations of "Full Code" and "DNR" and alternatives were discussed in detail. Discussion of end-of-life issues including but not limited palliative care, hospice care and the concept of hospice, other end-of-life care options, power of attorney for health care decisions, living wills, and physician orders for life-sustaining treatment were also discussed with the patient and family. Total face to face time 36 minutes. -patient  wants to continue full scope of care -12/02/19--confirms full scope of care; Plan to d/c to SNF and stretcher HD facility -12/04/19--[name] appears to have little motivation--refuses to sit up in chair; minimal effort with PT and intermittently refuses PT -ultimate plan is to find HD facility which will accomodate "stetcher dialysis"  Status is: Inpatient  Remains inpatient appropriate because:IV treatments appropriate due to intensity of illness or inability to take PO Remains orthostatic putting him at high risk for re-admission  We are awaiting for placement at SNF that can transport him for stretcher HD treatments.  At this time the hold up is getting an accepting MD nephrologist in [facility] area.  He lost SNF bed 1/7 but we are told maybe can have a bed on early next week.    Dispo: Patient From: [facility] Planned Disposition: [facility] Expected discharge date: 12/11/2020 Medically stable for discharge: YES  Family Communication: daughter updated 12/01/20  significant other updated 12/02/20  Consultants: renal, GI, palliative  Code Status: FULL   DVT Prophylaxis: SCDs   Procedures: As Listed in Progress Note Above  Antibiotics: None  Subjective:  Patient without complaints.    Objective: Vitals:   12/07/20 1730 12/07/20 2008 12/08/20 0530 12/08/20 1520  BP: 129/67 113/60 123/68 (!) 141/82  Pulse: 82 60 (!) 57 (!) 56  Resp: 17 16 16 16   Temp: 97.9 F (36.6 C) 97.6 F (36.4 C) 97.9 F (36.6 C)   TempSrc:  Oral Oral   SpO2: 100% 99% 99% 98%  Weight:   65.9 kg   Height:        Intake/Output Summary (Last 24 hours) at 12/08/2020 1618 Last data filed at 12/08/2020 0500 Gross per 24 hour  Intake 750 ml  Output 300 ml  Net 450 ml   Weight change: 1.5 kg Exam:   General:  Pt is alert, follows commands appropriately, not in acute distress, affect less flat today  HEENT:  No icterus, No thrush, No neck mass, [place]/AT  Cardiovascular: normal S1/S2,  no rubs, no gallops  Respiratory: bibasilar rales. No wheeze  Abdomen: Soft/+BS, non tender, non distended, no guarding  Extremities: No edema, No lymphangitis, No petechiae, No rashes, no synovitis  Data Reviewed: I have personally reviewed following labs and imaging studies Basic Metabolic Panel: Recent Labs  Lab 12/02/20 0557 12/03/20 0509 12/04/20 0500  NA 126* 129* 131*  K 3.4* 3.3* 3.2*  CL 95* 95* 96*  CO2 23 22 24   GLUCOSE 130* 95 98  BUN 44* 52* 29*  CREATININE 5.55* 6.17* 4.15*  CALCIUM 7.9* 7.8* 7.6*  PHOS 4.2 5.0* 3.3   Liver Function Tests: Recent Labs  Lab 12/02/20 0557 12/03/20 0509 12/04/20 0500  ALBUMIN 2.0* 2.0* 2.2*   No results for input(s): LIPASE, AMYLASE in the last 168 hours. No results for input(s): AMMONIA in the last 168 hours. Coagulation Profile: No results for input(s): INR, PROTIME in the last 168 hours. CBC: Recent Labs  Lab 12/03/20 0509  WBC 11.6*  HGB 8.1*  HCT 26.1*  MCV 95.6  PLT 269   Cardiac Enzymes: No results for input(s): CKTOTAL, CKMB, CKMBINDEX, TROPONINI in the last 168 hours. BNP: Invalid input(s): POCBNP CBG: Recent Labs  Lab 12/07/20 1048 12/07/20 1113 12/07/20 1241  GLUCAP 66* 111* 87   HbA1C: No results for input(s): HGBA1C in the last 72 hours. Urine analysis:    Component Value Date/Time   COLORURINE AMBER (A) 12/01/2020 1750   APPEARANCEUR CLOUDY (A) 12/01/2020 1750   LABSPEC 1.008 12/01/2020 1750   PHURINE 6.0 12/01/2020 1750   GLUCOSEU NEGATIVE 12/01/2020 1750   HGBUR LARGE (A) 12/01/2020 1750   BILIRUBINUR NEGATIVE 12/01/2020 1750   KETONESUR NEGATIVE 12/01/2020 1750   PROTEINUR 100 (A) 12/01/2020 1750   NITRITE NEGATIVE 12/01/2020 1750   LEUKOCYTESUR LARGE (A) 12/01/2020 1750    Recent Results (from the past 240 hour(s))  Culture, Urine     Status: Abnormal   Collection Time: 12/01/20  5:50 PM   Specimen:  Urine, Catheterized  Result Value Ref Range Status   Specimen Description   Final    URINE, CATHETERIZED Performed at [facility], [address] [place], [place] [address]    Special Requests   Final    NONE Performed at [facility], [address] [place], [place] [address]    Culture >=100,000 COLONIES/mL SERRATIA MARCESCENS (A)  Final   Report Status 12/04/2020 FINAL  Final   Organism ID, Bacteria SERRATIA MARCESCENS (A)  Final      Susceptibility   Serratia marcescens - MIC*    CEFAZOLIN >=64 RESISTANT Resistant     CEFEPIME <=0.12 SENSITIVE Sensitive     CEFTRIAXONE 1 SENSITIVE Sensitive     CIPROFLOXACIN 2 INTERMEDIATE Intermediate     GENTAMICIN <=1 SENSITIVE Sensitive     NITROFURANTOIN 256 RESISTANT Resistant     TRIMETH/SULFA <=20 SENSITIVE Sensitive     * >=100,000 COLONIES/mL SERRATIA MARCESCENS  SARS CORONAVIRUS 2 (TAT 6-24 HRS) Nasopharyngeal Nasopharyngeal Swab     Status: None   Collection Time: 12/06/20  4:50 PM   Specimen: Nasopharyngeal Swab  Result Value Ref Range Status   SARS Coronavirus 2 NEGATIVE NEGATIVE Final    Comment: (NOTE) SARS-CoV-2 target nucleic acids are NOT DETECTED.  The SARS-CoV-2 RNA is generally detectable in upper and lower respiratory specimens during the acute phase of infection. Negative results do not preclude SARS-CoV-2 infection, do not rule out co-infections with other pathogens, and should not be used as the sole basis for treatment or other  patient management decisions. Negative results must be combined with clinical observations, patient history, and epidemiological information. The expected result is Negative.  Fact Sheet for Patients: SugarRoll.be  Fact Sheet for Healthcare Providers: https://www.woods-mathews.com/  This test is not yet approved or cleared by the [place] FDA and  has been authorized for detection and/or diagnosis of SARS-CoV-2 by FDA under an Emergency  Use Authorization (EUA). This EUA will remain  in effect (meaning this test can be used) for the duration of the COVID-19 declaration under Se ction 564(b)(1) of the Act, 21 U.S.C. section 360bbb-3(b)(1), unless the authorization is terminated or revoked sooner.  Performed at [facility], [place] [address] [place], [place] [address]   Surgical pcr screen     Status: None   Collection Time: 12/07/20  6:08 AM   Specimen: Nasal Mucosa; Nasal Swab  Result Value Ref Range Status   MRSA, PCR NEGATIVE NEGATIVE Final   Staphylococcus aureus NEGATIVE NEGATIVE Final    Comment: (NOTE) The Xpert SA Assay (FDA approved for NASAL specimens in patients 73 years of age and older), is one component of a comprehensive surveillance program. It is not intended to diagnose infection nor to guide or monitor treatment. Performed at [facility], [address], [place], [place] [address]      Scheduled Meds: . amiodarone  200 mg Oral Daily  . calcitRIOL  0.5 mcg Oral Daily  . Chlorhexidine Gluconate Cloth  6 each Topical Q0600  . Chlorhexidine Gluconate Cloth  6 each Topical Q0600  . collagenase   Topical Daily  . darbepoetin (ARANESP) injection - DIALYSIS  200 mcg Intravenous Q Mon-HD  . famotidine  20 mg Oral Daily  . feeding supplement  237 mL Oral BID BM  . finasteride  5 mg Oral Daily  . fludrocortisone  0.2 mg Oral Daily  . metoprolol tartrate  12.5 mg Oral QHS  . midodrine  10 mg Oral TID with meals  . multivitamin  1 tablet Oral QHS  . pantoprazole  40 mg Oral BID AC  . psyllium  1 packet Oral QHS  . pyridostigmine  60 mg Oral Daily  . simvastatin  20 mg Oral QPM  . sodium chloride flush  3 mL Intravenous Q12H   Continuous Infusions: . sodium chloride    . sodium chloride    . sodium chloride      Procedures/Studies: MR BRAIN WO CONTRAST  Result Date: 11/16/2020 CLINICAL DATA:  Question deposition disease. Hypotension. Altered mental status. EXAM: MRI HEAD WITHOUT CONTRAST  TECHNIQUE: Multiplanar, multiecho pulse sequences of the brain and surrounding structures were obtained without intravenous contrast. COMPARISON:  Head CT 01/25/2020 FINDINGS: Brain: Diffusion imaging does not show any acute or subacute infarction. No focal abnormality affects brainstem or cerebellum. Cerebral hemispheres show mild age related volume loss without evidence of small-vessel disease or large vessel infarction. No mass lesion, hemorrhage, hydrocephalus or extra-axial collection. Vascular: Major vessels at the base of the brain show flow. Skull and upper cervical spine: Negative Sinuses/Orbits: Clear/normal Other: None IMPRESSION: No acute or reversible finding. Mild age related volume loss. No evidence of small-vessel disease or large vessel infarction. Electronically Signed   By: Nelson Chimes M.D.   On: 11/16/2020 14:01   DG Chest Port 1 View  Result Date: 12/07/2020 CLINICAL DATA:  Post RIGHT-side dialysis catheter exchange EXAM: PORTABLE CHEST 1 VIEW COMPARISON:  Portable exam 1240 hours compared to 1003 hours FINDINGS: RIGHT jugular dual-lumen central venous catheter with tip projecting  over SVC. Normal heart size, mediastinal contours, and pulmonary vascularity. Atherosclerotic calcification aorta. Minimal chronic peribronchial thickening without pulmonary infiltrate, pleural effusion, or pneumothorax. Bones mildly demineralized. IMPRESSION: No pneumothorax following RIGHT jugular line placement. Minimal persistent bronchitic changes. Aortic Atherosclerosis (ICD10-I70.0). Electronically Signed   By: Lavonia Dana M.D.   On: 12/07/2020 12:55   DG Chest Port 1 View  Result Date: 12/06/2020 CLINICAL DATA:  End-stage renal disease. EXAM: PORTABLE CHEST 1 VIEW COMPARISON:  October 29, 2020. FINDINGS: The heart size and mediastinal contours are within normal limits. Both lungs are clear. Right internal jugular dialysis catheter is unchanged in position with distal tip in expected position of  cavoatrial junction. The visualized skeletal structures are unremarkable. IMPRESSION: No active disease. Electronically Signed   By: Marijo Conception M.D.   On: 12/06/2020 10:34   DG Abd Portable 1V  Result Date: 11/28/2020 CLINICAL DATA:  Abdominal distension abdominal pain EXAM: PORTABLE ABDOMEN - 1 VIEW COMPARISON:  November 07, 2020 CT assessment, abdominal plain film from November 15, 2020 FINDINGS: Decreased distension of bowel loops in the abdomen when compared to the study from November 15, 2020. Mild distension currently of scattered gas-filled loops of small bowel. Suggestion of small amount of proximal rectal gas. No acute skeletal process on limited assessment. Signs of basilar atelectasis. IMPRESSION: Decreased distension of bowel loops in the abdomen when compared to the study from November 15, 2020. Mild distension currently of scattered gas-filled loops of small bowel may reflect mild ileus. Suggestion of small amount of proximal rectal gas. Electronically Signed   By: Zetta Bills M.D.   On: 11/28/2020 16:15   DG Abd Portable 1V  Result Date: 11/15/2020 CLINICAL DATA:  Abdominal distension EXAM: PORTABLE ABDOMEN - 1 VIEW COMPARISON:  10/25/2020 FINDINGS: The subdiaphragmatic region and right flank are excluded from view. Multiple gas-filled prominent loops of large and small bowel are seen throughout the visualized abdomen in keeping with un underlying ileus. No gross free intraperitoneal gas. Gas and stool is seen within the rectal vault. No organomegaly. No acute bone abnormality. IMPRESSION: Mild ileus. Electronically Signed   By: Fidela Salisbury MD   On: 11/15/2020 04:15   DG C-Arm 1-60 Min-No Report  Result Date: 12/07/2020 Fluoroscopy was utilized by the requesting physician.  No radiographic interpretation.    Irwin Brakeman, MD  How to contact the [facility] Attending or Consulting provider [place] or covering provider during after hours [place], for this patient?  1. Check the  care team in [facility] and look for a) attending/consulting TR[facility] provider listed and b) the [facility] team listed 2. Log into [email or web address] and use [place]'s universal password to access. If you do not have the password, please contact the hospital operator. 3. Locate the [facility] provider you are looking for under [facility] and page to a number that you can be directly reached. 4. If you still have difficulty reaching the provider, please page the [facility] (Director on Call) for the Hospitalists listed on amion for assistance.  12/08/2020, 4:18 PM   LOS: 36 days

## 2020-12-08 NOTE — Procedures (Signed)
   HEMODIALYSIS TREATMENT NOTE:  Catheter was occluded pre-HD; neither port would aspirate and both flushed with extreme resistance.  Activase was instilled in both ports.  After 90 minutes, and with moderate resistance, Activase was fully withdrawn.  Lumens collapsed repeatedly.  Saline flushes were then attempted but both lumens, again, flushed with same extreme amount of resistance.  I was unable to obtain blood specimen for CBC and RFP.    Situation was d/w Dr. Joylene Grapes who ordered catheter be locked with heparin (did not advise overnight TPA dwell) for further investigation tomorrow.  RFP and CBC in a.m.  Rockwell Alexandria, RN

## 2020-12-09 DIAGNOSIS — N186 End stage renal disease: Secondary | ICD-10-CM | POA: Diagnosis not present

## 2020-12-09 DIAGNOSIS — I951 Orthostatic hypotension: Secondary | ICD-10-CM | POA: Diagnosis not present

## 2020-12-09 DIAGNOSIS — Z992 Dependence on renal dialysis: Secondary | ICD-10-CM | POA: Diagnosis not present

## 2020-12-09 DIAGNOSIS — R140 Abdominal distension (gaseous): Secondary | ICD-10-CM | POA: Diagnosis not present

## 2020-12-09 LAB — CBC
HCT: 26.9 % — ABNORMAL LOW (ref 39.0–52.0)
Hemoglobin: 8.1 g/dL — ABNORMAL LOW (ref 13.0–17.0)
MCH: 29.5 pg (ref 26.0–34.0)
MCHC: 30.1 g/dL (ref 30.0–36.0)
MCV: 97.8 fL (ref 80.0–100.0)
Platelets: 196 10*3/uL (ref 150–400)
RBC: 2.75 MIL/uL — ABNORMAL LOW (ref 4.22–5.81)
RDW: 19.6 % — ABNORMAL HIGH (ref 11.5–15.5)
WBC: 9.2 10*3/uL (ref 4.0–10.5)
nRBC: 0 % (ref 0.0–0.2)

## 2020-12-09 LAB — RENAL FUNCTION PANEL
Albumin: 2.1 g/dL — ABNORMAL LOW (ref 3.5–5.0)
Anion gap: 11 (ref 5–15)
BUN: 41 mg/dL — ABNORMAL HIGH (ref 8–23)
CO2: 22 mmol/L (ref 22–32)
Calcium: 7.7 mg/dL — ABNORMAL LOW (ref 8.9–10.3)
Chloride: 98 mmol/L (ref 98–111)
Creatinine, Ser: 6.55 mg/dL — ABNORMAL HIGH (ref 0.61–1.24)
GFR, Estimated: 8 mL/min — ABNORMAL LOW (ref >60)
Glucose, Bld: 130 mg/dL — ABNORMAL HIGH (ref 70–99)
Phosphorus: 4.5 mg/dL (ref 2.5–4.6)
Potassium: 3.1 mmol/L — ABNORMAL LOW (ref 3.5–5.1)
Sodium: 131 mmol/L — ABNORMAL LOW (ref 135–145)

## 2020-12-09 NOTE — Progress Notes (Signed)
PROGRESS NOTE  [name] OEU:[id number] DOB: June 20, 1951 DOA: 11/02/2020 PCP: Patient, No Pcp Per  Brief History: 70yo male with history of diabetes mellitus, CKD stage V started recently on dialysis Monday Wednesday Friday and still making urine, recent A. fib and was restarted on amiodarone metoprolol and Eliquis was sent fromSNFafter he was noted to have an episode of syncope while getting up to go to hemodialysis.On arrival to the ED his systolic blood pressures were in the 70 mmHg range. He is noted to have some diarrhea, but he has recently completed course of ciprofloxacin and Flagyl by 11/23. He was noted to have nonspecific colitis on pathology after recent colonoscopy on 11/17 and was started on prednisone taper which he continues to take at this time.He was just discharged after admission from 10/12/20 to 11/01/20 when he was treated for syncope from orthostasis as well as colitis as discussed above. He also required 5 units PRBC last admission. Hewas started on Solu-Medrol 60 mg daily 11/19--per GI>>po 40 mg daily--wean by 10 mg per week--started30 mg daily on 11/02/20. He is now finished with steroids  ED Course:Patient had received 250 mL of normal saline fluid bolus with improvement in blood pressure readings. Manual blood pressures confirm systolics in the low 154 range and heart rates fluctuate in the low 100 range. He denies any palpitations or chest pains or shortness of breath. Laboratory data with stable hemoglobin levels noted and he is noted to be hypokalemic with potassium 2.9 and he has a lactic acid level 2.6.   Assessment/Plan: Orthostatic hypotension -continues to be orthostatic despite pyridostigmine, midodrine and florinef -this lead to repeat admission after d/c on 11/01/20 -10/13/20--EchoEF 00-86%PYPPJ 1 diastolic dysfunction -addedabdominal binder and knee stockings -decrease metoprolol to once daily -am cortisol 23.1 -TSH  1.912 -not a candidate for Northera -case discussed with renal, Dr. Libby Maw d/c with stretcher HD until able to tolerate sitting and standing -11/30/20--discussed case with Loleta Books, Dr. Ace Gins additional to offer, agrees with our current treatment -OOB with each shift; Check orthostatic daily--remains orthostatic  Chronic Diarrhea/Colitis -presented with diarrhea and LGIB last admission in Nov 2021 -improving with imodium -initially treated with cip/fl and then 4 weeks of steroid taper -10/15/20, 11/06/20 and12/22/21 GI pathogen panel neg -11/02/20 Cdiff neg -11/05/20 Stool O&P--neg -GI following -10/17/20 colonoscopy-area of moderately congested mucosa with ulceration was found in the sigmoid colon and in the descending colon.Bllood noted in rectum, sigmoid and descending colon -11/21/20 colonoscopy--polyps in asc colon & 2 polyps hepatic flexure, extensive circumferential ulcers in distal sigmoid -11/17/21Biopsies of rectum and sigmoid colon show active nonspecific colitis w/ ulceration c/f medication induced (NSAIDs), ischemia, or stercoral proctitis -12//22/21 TI and colon biopsies--neg for inflammation; +tubular adenmoa -Fecal elastase and qualitative fecal fat analysisordered by GI unrevealing -11/30/20--discussed case with Loleta Books, Dr. Ace Gins additional to offer, agrees with our current treatment  Anemia of CKD -had rectal bleed last admit requiring 5 units PRBC -11/21/20--received 1 unit this admit -multifactorial including CKD, blood draws, chronic disease -iron studies consistent with ACD  ESRD -Biopsy results from outpatient nephrologist moderate to severe arterial nephrosclerosis with 75% global glomerulosclerosis, focal and segmental glomerular atrophic scarring and diffuse moderate to severe tubulointerstitial scarring --started recently on dialysis MWF --HD on 12/28--unable to remove fluid --having intradialytic hypotension already on  midodrine-->having difficulty removing fluid -discussed with Estanislado Emms to position in sitting position for HD -TDC replaced 12/07/20 by Dr. Constance Haw, unfortunately 1/8 had clotted  and was unable to use, nephrology working on solutions  Recent paroxysmal A. fib --started on amiodaronelast admission --Eliquis--holdingdue to his rectal bleeding and acute blood loss anemia --Patient understands risks and benefits of holding anticoagulation and he seems to be reluctant to start it again-we will need close follow-up with his cardiologist/PCP. --metoprolol stopped due to orthostasis initially --started metoprolol 12.5 mg bid as pt having more afib RVR episodesfrom last admission>>decreased to once daily due to orthostatic hypotension  Hyponatremia, --sodiumis improving with HD treatment.  Hyperlipidemia: --Continue statin.  Acute urinary retention- s/p foley placement 12/16. Foley removed 12/23 and he had been voiding. Unfortunately he developed acute urinary retention again on 12/25 and foley was replaced with >900 mL urine relieved. Will leave foley in place for now until he can follow up outpatient with urology.  Intermittent Hematuria/CAUTI -likely due to foley trauma and intermittent movement/UTI -anchor foley -am CBC--Hgb stable -UA>50WBC -urine culture= serratia -completed course of ceftriaxone  Goals of Care -long discussion with patient's daughter at bedside 11/29/20 Advance care planning, including the explanation and discussion of advance directives was carried out with the patient and family. Code status including explanations of "Full Code" and "DNR" and alternatives were discussed in detail. Discussion of end-of-life issues including but not limited palliative care, hospice care and the concept of hospice, other end-of-life care options, power of attorney for health care decisions, living wills, and physician orders for life-sustaining treatment were also  discussed with the patient and family. Total face to face time 36 minutes. -patient wants to continue full scope of care -12/02/19--confirms full scope of care; Plan to d/c to SNF and stretcher HD facility -12/04/19--Mr. Cortese appears to have little motivation--refuses to sit up in chair; minimal effort with PT and intermittently refuses PT -ultimate plan is to find HD facility which will accomodate "stetcher dialysis"  Status is: Inpatient  Remains inpatient appropriate because:IV treatments appropriate due to intensity of illness or inability to take PO Remains orthostatic putting him at high risk for re-admission  We are awaiting for placement at SNF that can transport him for stretcher HD treatments.  At this time the hold up is getting an accepting MD nephrologist in [facility] area.  He lost SNF bed 1/7 but we are told maybe can have a bed on early next week.    Dispo: Patient From: [facility] Planned Disposition: [facility] Expected discharge date: 12/11/2020 Medically stable for discharge: YES  Family Communication: daughter updated 12/01/20  significant other updated 12/02/20  Consultants: renal, GI, palliative  Code Status: FULL   DVT Prophylaxis: SCDs   Procedures: As Listed in Progress Note Above  Antibiotics: None  Subjective:  Patient reports he feels well today, he is a little bummed out about his catheter not functioning but willing to do what is needed     Objective: Vitals:   12/08/20 2027 12/08/20 2130 12/09/20 0500 12/09/20 0532  BP:  134/78  (!) 144/63  Pulse:  62  (!) 57  Resp:  16  16  Temp:  97.8 F (36.6 C)  97.8 F (36.6 C)  TempSrc:  Oral  Oral  SpO2: 98%   99%  Weight:   67.3 kg   Height:        Intake/Output Summary (Last 24 hours) at 12/09/2020 1005 Last data filed at 12/08/2020 2210 Gross per 24 hour  Intake 480 ml  Output -  Net 480 ml   Weight  change: 0.2 kg Exam:  General:  Pt is alert, follows commands appropriately, not in acute distress, affect less flat today  HEENT: No icterus, No thrush, No neck mass, [place]/AT  Cardiovascular: normal S1/S2, no rubs, no gallops  Respiratory: bibasilar rales. No wheeze  Abdomen: Soft/+BS, non tender, non distended, no guarding  Extremities: No edema, No lymphangitis, No petechiae, No rashes, no synovitis  Data Reviewed: I have personally reviewed following labs and imaging studies Basic Metabolic Panel: Recent Labs  Lab 12/03/20 0509 12/04/20 0500  NA 129* 131*  K 3.3* 3.2*  CL 95* 96*  CO2 22 24  GLUCOSE 95 98  BUN 52* 29*  CREATININE 6.17* 4.15*  CALCIUM 7.8* 7.6*  PHOS 5.0* 3.3   Liver Function Tests: Recent Labs  Lab 12/03/20 0509 12/04/20 0500  ALBUMIN 2.0* 2.2*   No results for input(s): LIPASE, AMYLASE in the last 168 hours. No results for input(s): AMMONIA in the last 168 hours. Coagulation Profile: No results for input(s): INR, PROTIME in the last 168 hours. CBC: Recent Labs  Lab 12/03/20 0509  WBC 11.6*  HGB 8.1*  HCT 26.1*  MCV 95.6  PLT 269   Cardiac Enzymes: No results for input(s): CKTOTAL, CKMB, CKMBINDEX, TROPONINI in the last 168 hours. BNP: Invalid input(s): POCBNP CBG: Recent Labs  Lab 12/07/20 1048 12/07/20 1113 12/07/20 1241  GLUCAP 66* 111* 87   HbA1C: No results for input(s): HGBA1C in the last 72 hours. Urine analysis:    Component Value Date/Time   COLORURINE AMBER (A) 12/01/2020 1750   APPEARANCEUR CLOUDY (A) 12/01/2020 1750   LABSPEC 1.008 12/01/2020 1750   PHURINE 6.0 12/01/2020 1750   GLUCOSEU NEGATIVE 12/01/2020 1750   HGBUR LARGE (A) 12/01/2020 1750   BILIRUBINUR NEGATIVE 12/01/2020 1750   KETONESUR NEGATIVE 12/01/2020 1750   PROTEINUR 100 (A) 12/01/2020 1750   NITRITE NEGATIVE 12/01/2020 1750   LEUKOCYTESUR LARGE (A) 12/01/2020 1750    Recent Results (from the past 240 hour(s))  Culture, Urine      Status: Abnormal   Collection Time: 12/01/20  5:50 PM   Specimen: Urine, Catheterized  Result Value Ref Range Status   Specimen Description   Final    URINE, CATHETERIZED Performed at [facility], [address] [place], [place] [address]    Special Requests   Final    NONE Performed at [facility], [address] [place], [place] [address]    Culture >=100,000 COLONIES/mL SERRATIA MARCESCENS (A)  Final   Report Status 12/04/2020 FINAL  Final   Organism ID, Bacteria SERRATIA MARCESCENS (A)  Final      Susceptibility   Serratia marcescens - MIC*    CEFAZOLIN >=64 RESISTANT Resistant     CEFEPIME <=0.12 SENSITIVE Sensitive     CEFTRIAXONE 1 SENSITIVE Sensitive     CIPROFLOXACIN 2 INTERMEDIATE Intermediate     GENTAMICIN <=1 SENSITIVE Sensitive     NITROFURANTOIN 256 RESISTANT Resistant     TRIMETH/SULFA <=20 SENSITIVE Sensitive     * >=100,000 COLONIES/mL SERRATIA MARCESCENS  SARS CORONAVIRUS 2 (TAT 6-24 HRS) Nasopharyngeal Nasopharyngeal Swab     Status: None   Collection Time: 12/06/20  4:50 PM   Specimen: Nasopharyngeal Swab  Result Value Ref Range Status   SARS Coronavirus 2 NEGATIVE NEGATIVE Final    Comment: (NOTE) SARS-CoV-2 target nucleic acids are NOT DETECTED.  The SARS-CoV-2 RNA is generally detectable in upper and lower respiratory specimens during the acute phase of infection. Negative results do not preclude SARS-CoV-2 infection, do not rule out co-infections  with other pathogens, and should not be used as the sole basis for treatment or other patient management decisions. Negative results must be combined with clinical observations, patient history, and epidemiological information. The expected result is Negative.  Fact Sheet for Patients: SugarRoll.be  Fact Sheet for Healthcare Providers: https://www.woods-mathews.com/  This test is not yet approved or cleared by the [place] FDA and  has been authorized for  detection and/or diagnosis of SARS-CoV-2 by FDA under an Emergency Use Authorization (EUA). This EUA will remain  in effect (meaning this test can be used) for the duration of the COVID-19 declaration under Se ction 564(b)(1) of the Act, 21 U.S.C. section 360bbb-3(b)(1), unless the authorization is terminated or revoked sooner.  Performed at [facility], [place] [address], [place], [place] [address]   Surgical pcr screen     Status: None   Collection Time: 12/07/20  6:08 AM   Specimen: Nasal Mucosa; Nasal Swab  Result Value Ref Range Status   MRSA, PCR NEGATIVE NEGATIVE Final   Staphylococcus aureus NEGATIVE NEGATIVE Final    Comment: (NOTE) The Xpert SA Assay (FDA approved for NASAL specimens in patients 45 years of age and older), is one component of a comprehensive surveillance program. It is not intended to diagnose infection nor to guide or monitor treatment. Performed at [facility], [address], [place], [place] [address]      Scheduled Meds: . amiodarone  200 mg Oral Daily  . calcitRIOL  0.5 mcg Oral Daily  . Chlorhexidine Gluconate Cloth  6 each Topical Q0600  . Chlorhexidine Gluconate Cloth  6 each Topical Q0600  . collagenase   Topical Daily  . darbepoetin (ARANESP) injection - DIALYSIS  200 mcg Intravenous Q Mon-HD  . famotidine  20 mg Oral Daily  . feeding supplement  237 mL Oral BID BM  . finasteride  5 mg Oral Daily  . fludrocortisone  0.2 mg Oral Daily  . metoprolol tartrate  12.5 mg Oral QHS  . midodrine  10 mg Oral TID with meals  . multivitamin  1 tablet Oral QHS  . pantoprazole  40 mg Oral BID AC  . psyllium  1 packet Oral QHS  . pyridostigmine  60 mg Oral Daily  . simvastatin  20 mg Oral QPM  . sodium chloride flush  3 mL Intravenous Q12H   Continuous Infusions: . sodium chloride    . sodium chloride    . sodium chloride      Procedures/Studies: MR BRAIN WO CONTRAST  Result Date: 11/16/2020 CLINICAL DATA:  Question deposition disease.  Hypotension. Altered mental status. EXAM: MRI HEAD WITHOUT CONTRAST TECHNIQUE: Multiplanar, multiecho pulse sequences of the brain and surrounding structures were obtained without intravenous contrast. COMPARISON:  Head CT 01/25/2020 FINDINGS: Brain: Diffusion imaging does not show any acute or subacute infarction. No focal abnormality affects brainstem or cerebellum. Cerebral hemispheres show mild age related volume loss without evidence of small-vessel disease or large vessel infarction. No mass lesion, hemorrhage, hydrocephalus or extra-axial collection. Vascular: Major vessels at the base of the brain show flow. Skull and upper cervical spine: Negative Sinuses/Orbits: Clear/normal Other: None IMPRESSION: No acute or reversible finding. Mild age related volume loss. No evidence of small-vessel disease or large vessel infarction. Electronically Signed   By: Nelson Chimes M.D.   On: 11/16/2020 14:01   DG Chest Port 1 View  Result Date: 12/07/2020 CLINICAL DATA:  Post RIGHT-side dialysis catheter exchange EXAM: PORTABLE CHEST 1 VIEW COMPARISON:  Portable exam  1240 hours compared to 1003 hours FINDINGS: RIGHT jugular dual-lumen central venous catheter with tip projecting over SVC. Normal heart size, mediastinal contours, and pulmonary vascularity. Atherosclerotic calcification aorta. Minimal chronic peribronchial thickening without pulmonary infiltrate, pleural effusion, or pneumothorax. Bones mildly demineralized. IMPRESSION: No pneumothorax following RIGHT jugular line placement. Minimal persistent bronchitic changes. Aortic Atherosclerosis (ICD10-I70.0). Electronically Signed   By: Lavonia Dana M.D.   On: 12/07/2020 12:55   DG Chest Port 1 View  Result Date: 12/06/2020 CLINICAL DATA:  End-stage renal disease. EXAM: PORTABLE CHEST 1 VIEW COMPARISON:  October 29, 2020. FINDINGS: The heart size and mediastinal contours are within normal limits. Both lungs are clear. Right internal jugular dialysis catheter is  unchanged in position with distal tip in expected position of cavoatrial junction. The visualized skeletal structures are unremarkable. IMPRESSION: No active disease. Electronically Signed   By: Marijo Conception M.D.   On: 12/06/2020 10:34   DG Abd Portable 1V  Result Date: 11/28/2020 CLINICAL DATA:  Abdominal distension abdominal pain EXAM: PORTABLE ABDOMEN - 1 VIEW COMPARISON:  November 07, 2020 CT assessment, abdominal plain film from November 15, 2020 FINDINGS: Decreased distension of bowel loops in the abdomen when compared to the study from November 15, 2020. Mild distension currently of scattered gas-filled loops of small bowel. Suggestion of small amount of proximal rectal gas. No acute skeletal process on limited assessment. Signs of basilar atelectasis. IMPRESSION: Decreased distension of bowel loops in the abdomen when compared to the study from November 15, 2020. Mild distension currently of scattered gas-filled loops of small bowel may reflect mild ileus. Suggestion of small amount of proximal rectal gas. Electronically Signed   By: Zetta Bills M.D.   On: 11/28/2020 16:15   DG Abd Portable 1V  Result Date: 11/15/2020 CLINICAL DATA:  Abdominal distension EXAM: PORTABLE ABDOMEN - 1 VIEW COMPARISON:  10/25/2020 FINDINGS: The subdiaphragmatic region and right flank are excluded from view. Multiple gas-filled prominent loops of large and small bowel are seen throughout the visualized abdomen in keeping with un underlying ileus. No gross free intraperitoneal gas. Gas and stool is seen within the rectal vault. No organomegaly. No acute bone abnormality. IMPRESSION: Mild ileus. Electronically Signed   By: Fidela Salisbury MD   On: 11/15/2020 04:15   DG C-Arm 1-60 Min-No Report  Result Date: 12/07/2020 Fluoroscopy was utilized by the requesting physician.  No radiographic interpretation.    Irwin Brakeman, MD  How to contact the [facility] Attending or Consulting provider [place] or covering provider  during after hours [place], for this patient?  1. Check the care team in [facility] and look for a) attending/consulting TRH provider listed and b) the [facility] team listed 2. Log into [email or web address] and use [place]'s universal password to access. If you do not have the password, please contact the hospital operator. 3. Locate the [facility] provider you are looking for under [facility] and page to a number that you can be directly reached. 4. If you still have difficulty reaching the provider, please page the [facility] (Director on Call) for the Hospitalists listed on amion for assistance.  12/09/2020, 10:05 AM   LOS: 37 days

## 2020-12-10 ENCOUNTER — Encounter (HOSPITAL_COMMUNITY): Payer: Self-pay | Admitting: General Surgery

## 2020-12-10 ENCOUNTER — Encounter (HOSPITAL_COMMUNITY)
Admission: EM | Disposition: A | Discharge status: Other Acute Inpatient Hospital | Payer: Self-pay | Source: Skilled Nursing Facility | Attending: Family Medicine

## 2020-12-10 ENCOUNTER — Inpatient Hospital Stay (HOSPITAL_COMMUNITY): Payer: Medicare Other | Admitting: Anesthesiology

## 2020-12-10 ENCOUNTER — Inpatient Hospital Stay (HOSPITAL_COMMUNITY): Payer: Medicare Other

## 2020-12-10 DIAGNOSIS — N186 End stage renal disease: Secondary | ICD-10-CM | POA: Diagnosis not present

## 2020-12-10 DIAGNOSIS — R140 Abdominal distension (gaseous): Secondary | ICD-10-CM | POA: Diagnosis not present

## 2020-12-10 DIAGNOSIS — T8249XD Other complication of vascular dialysis catheter, subsequent encounter: Secondary | ICD-10-CM | POA: Diagnosis not present

## 2020-12-10 DIAGNOSIS — T82898A Other specified complication of vascular prosthetic devices, implants and grafts, initial encounter: Secondary | ICD-10-CM

## 2020-12-10 DIAGNOSIS — T82898D Other specified complication of vascular prosthetic devices, implants and grafts, subsequent encounter: Secondary | ICD-10-CM

## 2020-12-10 DIAGNOSIS — K529 Noninfective gastroenteritis and colitis, unspecified: Secondary | ICD-10-CM | POA: Diagnosis not present

## 2020-12-10 HISTORY — PX: INSERTION OF DIALYSIS CATHETER: SHX1324

## 2020-12-10 LAB — RENAL FUNCTION PANEL
Albumin: 2.1 g/dL — ABNORMAL LOW (ref 3.5–5.0)
Anion gap: 13 (ref 5–15)
BUN: 45 mg/dL — ABNORMAL HIGH (ref 8–23)
CO2: 23 mmol/L (ref 22–32)
Calcium: 7.7 mg/dL — ABNORMAL LOW (ref 8.9–10.3)
Chloride: 98 mmol/L (ref 98–111)
Creatinine, Ser: 6.88 mg/dL — ABNORMAL HIGH (ref 0.61–1.24)
GFR, Estimated: 8 mL/min — ABNORMAL LOW (ref >60)
Glucose, Bld: 87 mg/dL (ref 70–99)
Phosphorus: 5.1 mg/dL — ABNORMAL HIGH (ref 2.5–4.6)
Potassium: 3.4 mmol/L — ABNORMAL LOW (ref 3.5–5.1)
Sodium: 134 mmol/L — ABNORMAL LOW (ref 135–145)

## 2020-12-10 LAB — GLUCOSE, CAPILLARY: Glucose-Capillary: 86 mg/dL (ref 70–99)

## 2020-12-10 SURGERY — INSERTION OF DIALYSIS CATHETER
Anesthesia: General | Site: Chest | Laterality: Right

## 2020-12-10 MED ORDER — ORAL CARE MOUTH RINSE: 15.0000 mL | Freq: Once | OROMUCOSAL | Status: AC

## 2020-12-10 MED ORDER — ENSURE ENLIVE PO LIQD: 237.0000 mL | Freq: Two times a day (BID) | ORAL | 12 refills | Status: DC

## 2020-12-10 MED ORDER — CHLORHEXIDINE GLUCONATE CLOTH 2 % EX PADS: 6.0000 | MEDICATED_PAD | Freq: Once | CUTANEOUS | Status: DC

## 2020-12-10 MED ORDER — FLUDROCORTISONE ACETATE 0.1 MG PO TABS
0.2000 mg | ORAL_TABLET | Freq: Every day | ORAL | Status: AC
Start: 2020-12-11 — End: ?

## 2020-12-10 MED ORDER — CHLORHEXIDINE GLUCONATE 0.12 % MT SOLN
15.0000 mL | Freq: Once | OROMUCOSAL | Status: AC
  Administered 2020-12-10: 15 mL via OROMUCOSAL

## 2020-12-10 MED ORDER — SODIUM CHLORIDE 0.9 % IV SOLN
INTRAVENOUS | Status: DC
  Administered 2020-12-10: 500 mL via INTRAVENOUS

## 2020-12-10 MED ORDER — PROPOFOL 10 MG/ML IV BOLUS
INTRAVENOUS | Status: DC | PRN
  Administered 2020-12-10 (×2): 20 mg via INTRAVENOUS

## 2020-12-10 MED ORDER — LIDOCAINE HCL (PF) 1 % IJ SOLN
INTRAMUSCULAR | Status: AC
  Filled 2020-12-10: qty 30

## 2020-12-10 MED ORDER — CALCITRIOL 0.5 MCG PO CAPS: 0.5000 ug | ORAL_CAPSULE | Freq: Every day | ORAL | Status: AC

## 2020-12-10 MED ORDER — HEPARIN 1000 UNIT/ML FOR PERITONEAL DIALYSIS
INTRAMUSCULAR | Status: DC | PRN
  Administered 2020-12-10: 4 mL via INTRAPERITONEAL

## 2020-12-10 MED ORDER — CEFAZOLIN SODIUM-DEXTROSE 2-4 GM/100ML-% IV SOLN
INTRAVENOUS | Status: AC
  Filled 2020-12-10: qty 100

## 2020-12-10 MED ORDER — ONDANSETRON HCL 4 MG/2ML IJ SOLN: 4.0000 mg | Freq: Once | INTRAMUSCULAR | Status: DC | PRN

## 2020-12-10 MED ORDER — FAMOTIDINE 20 MG PO TABS: 20.0000 mg | ORAL_TABLET | Freq: Every day | ORAL | Status: AC

## 2020-12-10 MED ORDER — HEPARIN SODIUM (PORCINE) 1000 UNIT/ML IJ SOLN
INTRAMUSCULAR | Status: AC
  Filled 2020-12-10: qty 4

## 2020-12-10 MED ORDER — METOPROLOL TARTRATE 25 MG PO TABS
12.5000 mg | ORAL_TABLET | Freq: Every day | ORAL | 0 refills | Status: AC
Start: 2020-12-10 — End: ?

## 2020-12-10 MED ORDER — PYRIDOSTIGMINE BROMIDE 60 MG PO TABS: 60.0000 mg | ORAL_TABLET | Freq: Every day | ORAL | Status: DC

## 2020-12-10 MED ORDER — PSYLLIUM 95 % PO PACK: 1.0000 | PACK | Freq: Every day | ORAL | Status: DC

## 2020-12-10 MED ORDER — HYDROMORPHONE HCL 1 MG/ML IJ SOLN: 0.2500 mg | INTRAMUSCULAR | Status: DC | PRN

## 2020-12-10 MED ORDER — FINASTERIDE 5 MG PO TABS
5.0000 mg | ORAL_TABLET | Freq: Every day | ORAL | Status: AC
Start: 2020-12-11 — End: ?

## 2020-12-10 MED ORDER — LIDOCAINE HCL (PF) 1 % IJ SOLN
INTRAMUSCULAR | Status: DC | PRN
  Administered 2020-12-10: 5 mL

## 2020-12-10 MED ORDER — SODIUM CHLORIDE (PF) 0.9 % IJ SOLN
INTRAMUSCULAR | Status: DC | PRN
  Administered 2020-12-10: 10 mL via INTRAVENOUS

## 2020-12-10 MED ORDER — RENA-VITE PO TABS: 1.0000 | ORAL_TABLET | Freq: Every day | ORAL | 0 refills | Status: AC

## 2020-12-10 MED ORDER — PANTOPRAZOLE SODIUM 40 MG PO TBEC: 40.0000 mg | DELAYED_RELEASE_TABLET | Freq: Two times a day (BID) | ORAL | Status: DC

## 2020-12-10 MED ORDER — CEFAZOLIN SODIUM-DEXTROSE 2-4 GM/100ML-% IV SOLN
2.0000 g | INTRAVENOUS | Status: AC
  Administered 2020-12-10: 2 g via INTRAVENOUS

## 2020-12-10 MED ORDER — COLLAGENASE 250 UNIT/GM EX OINT: TOPICAL_OINTMENT | Freq: Every day | CUTANEOUS | 0 refills | Status: AC

## 2020-12-10 MED ORDER — PROPOFOL 500 MG/50ML IV EMUL
INTRAVENOUS | Status: DC | PRN
  Administered 2020-12-10: 35 ug/kg/min via INTRAVENOUS

## 2020-12-10 MED ORDER — ACETAMINOPHEN 325 MG PO TABS
650.0000 mg | ORAL_TABLET | Freq: Four times a day (QID) | ORAL | Status: AC | PRN
Start: 2020-12-10 — End: ?

## 2020-12-10 SURGICAL SUPPLY — 45 items
APPLICATOR CHLORAPREP 10.5 ORG (MISCELLANEOUS) ×2 IMPLANT
BAG DECANTER FOR FLEXI CONT (MISCELLANEOUS) ×2 IMPLANT
BIOPATCH RED 1 DISK 7.0 (GAUZE/BANDAGES/DRESSINGS) ×2 IMPLANT
CATH PALINDROME-P 19CM W/VT (CATHETERS) IMPLANT
CATH PALINDROME-P 23CM W/VT (CATHETERS) ×2 IMPLANT
COVER LIGHT HANDLE STERIS (MISCELLANEOUS) ×4 IMPLANT
COVER PROBE U/S 5X48 (MISCELLANEOUS) ×2 IMPLANT
COVER WAND RF STERILE (DRAPES) ×2 IMPLANT
DECANTER SPIKE VIAL GLASS SM (MISCELLANEOUS) ×4 IMPLANT
DERMABOND ADVANCED (GAUZE/BANDAGES/DRESSINGS) ×1
DERMABOND ADVANCED .7 DNX12 (GAUZE/BANDAGES/DRESSINGS) ×1 IMPLANT
DRAPE C-ARM FOLDED MOBILE STRL (DRAPES) ×2 IMPLANT
DRAPE CHEST BREAST 15X10 FENES (DRAPES) ×2 IMPLANT
DRSG SORBAVIEW 3.5X5-5/16 MED (GAUZE/BANDAGES/DRESSINGS) ×2 IMPLANT
ELECT REM PT RETURN 9FT ADLT (ELECTROSURGICAL) ×2
ELECTRODE REM PT RTRN 9FT ADLT (ELECTROSURGICAL) ×1 IMPLANT
GAUZE 4X4 16PLY RFD (DISPOSABLE) ×2 IMPLANT
GAUZE SPONGE 4X4 12PLY STRL (GAUZE/BANDAGES/DRESSINGS) ×2 IMPLANT
GEL ULTRASOUND 20GR AQUASONIC (MISCELLANEOUS) ×2 IMPLANT
GLOVE BIO SURGEON STRL SZ 6.5 (GLOVE) ×2 IMPLANT
GLOVE BIOGEL PI IND STRL 6.5 (GLOVE) ×1 IMPLANT
GLOVE BIOGEL PI IND STRL 7.0 (GLOVE) ×2 IMPLANT
GLOVE BIOGEL PI INDICATOR 6.5 (GLOVE) ×1
GLOVE BIOGEL PI INDICATOR 7.0 (GLOVE) ×2
GOWN STRL REUS W/TWL LRG LVL3 (GOWN DISPOSABLE) ×4 IMPLANT
IV CONNECTOR ONE LINK NDLESS (IV SETS) IMPLANT
IV NS 500ML (IV SOLUTION) ×2
IV NS 500ML BAXH (IV SOLUTION) ×1 IMPLANT
KIT BLADEGUARD II DBL (SET/KITS/TRAYS/PACK) ×2 IMPLANT
KIT PALINDROME-P 55CM (CATHETERS) IMPLANT
KIT TURNOVER KIT A (KITS) ×2 IMPLANT
MARKER SKIN DUAL TIP RULER LAB (MISCELLANEOUS) ×2 IMPLANT
NEEDLE HYPO 18GX1.5 BLUNT FILL (NEEDLE) ×2 IMPLANT
NEEDLE HYPO 25X1 1.5 SAFETY (NEEDLE) ×2 IMPLANT
PACK BASIC III (CUSTOM PROCEDURE TRAY) ×2
PACK SRG BSC III STRL LF ECLPS (CUSTOM PROCEDURE TRAY) ×1 IMPLANT
PAD ARMBOARD 7.5X6 YLW CONV (MISCELLANEOUS) ×2 IMPLANT
PENCIL SMOKE EVACUATOR COATED (MISCELLANEOUS) ×2 IMPLANT
SET BASIN LINEN APH (SET/KITS/TRAYS/PACK) ×2 IMPLANT
SUT MNCRL AB 4-0 PS2 18 (SUTURE) ×2 IMPLANT
SUT SILK 2 0 FSL 18 (SUTURE) ×2 IMPLANT
SUT VIC AB 3-0 SH 27 (SUTURE) ×2
SUT VIC AB 3-0 SH 27X BRD (SUTURE) ×1 IMPLANT
SYR 10ML LL (SYRINGE) ×4 IMPLANT
SYR CONTROL 10ML LL (SYRINGE) ×2 IMPLANT

## 2020-12-10 NOTE — Anesthesia Preprocedure Evaluation (Addendum)
Anesthesia Evaluation  Patient identified by MRN, date of birth, ID band Patient awake    Reviewed: Allergy & Precautions, NPO status , Patient's Chart, lab work & pertinent test results  History of Anesthesia Complications Negative for: history of anesthetic complications  Airway Mallampati: II  TM Distance: >3 FB Neck ROM: Full    Dental  (+) Dental Advisory Given, Caps, Implants   Pulmonary neg pulmonary ROS,    Pulmonary exam normal breath sounds clear to auscultation       Cardiovascular Exercise Tolerance: Good Normal cardiovascular exam+ dysrhythmias Atrial Fibrillation  Rhythm:Regular Rate:Normal  1. Left ventricular ejection fraction, by estimation, is 60 to 65%. The  left ventricle has normal function. The left ventricle has no regional  wall motion abnormalities. Left ventricular diastolic parameters are  consistent with Grade I diastolic  dysfunction (impaired relaxation). Elevated left atrial pressure.  2. Right ventricular systolic function is normal. The right ventricular  size is normal. There is normal pulmonary artery systolic pressure.  3. The mitral valve is normal in structure. No evidence of mitral valve  regurgitation. No evidence of mitral stenosis.  4. The aortic valve is normal in structure. Aortic valve regurgitation is  not visualized. No aortic stenosis is present.  5. There is Moderate (Grade III) protruding plaque involving the  transverse aorta.  6. The inferior vena cava is normal in size with greater than 50%  respiratory variability, suggesting right atrial pressure of 3 mmHg   Neuro/Psych  Neuromuscular disease    GI/Hepatic negative GI ROS, Neg liver ROS,   Endo/Other  diabetes, Well Controlled, Type 2, Oral Hypoglycemic Agents  Renal/GU ESRF and DialysisRenal disease     Musculoskeletal   Abdominal   Peds negative pediatric ROS (+)  Hematology  (+) anemia ,    Anesthesia Other Findings   Reproductive/Obstetrics                            Anesthesia Physical Anesthesia Plan  ASA: III  Anesthesia Plan: General   Post-op Pain Management:    Induction: Intravenous  PONV Risk Score and Plan: TIVA  Airway Management Planned: Nasal Cannula, Natural Airway and Simple Face Mask  Additional Equipment:   Intra-op Plan:   Post-operative Plan:   Informed Consent: I have reviewed the patients History and Physical, chart, labs and discussed the procedure including the risks, benefits and alternatives for the proposed anesthesia with the patient or authorized representative who has indicated his/her understanding and acceptance.     Dental advisory given  Plan Discussed with: CRNA and Surgeon  Anesthesia Plan Comments: (Possible GA with airway(LMA/ETT) was discussed.)       Anesthesia Quick Evaluation

## 2020-12-10 NOTE — Progress Notes (Signed)
Attempted to call report x2 to [facility] with no answer. Oncoming shift made aware. [facility] to still transport patient.

## 2020-12-10 NOTE — TOC Transition Note (Signed)
Transition of Care [facility]) - CM/SW Discharge Note   Patient Details  Name: [name] MRN: [id number] Date of Birth: 1950/12/11  [facility] of [facility] CM/SW Contact:  Salome Arnt, LCSW Phone Number: 12/10/2020, 5:00 PM   Clinical Narrative:  Pt d/c today to [facility]. Pt, pt's contact Kim, and facility aware and agreeable. Pt will transport via [facility]. D/C clinicals sent to facility and RN to call report- pt will go to room 127. Teena in admissions aware COVID negative on 1/6 and no repeat test needed.  Dialysis was arranged at [facility] with accepting physician, Dr. Willene Hatchet. Pt will start on Thursday 1/13 at 11:00 per [place]. SNF notified and confirmed they will arrange transport via [facility] for dialysis.      Final next level of care: Skilled Nursing Facility Barriers to Discharge: Barriers Resolved   Patient Goals and CMS Choice        Discharge Placement              Patient chooses bed at: Other - please specify in the comment section below: [facility]) Patient to be transferred to facility by: [facility] Name of family member notified: Kim Patient and family notified of of transfer: 12/10/20  Discharge Plan and Services                                     Social Determinants of Health (SDOH) Interventions     Readmission Risk Interventions No flowsheet data found.

## 2020-12-10 NOTE — Progress Notes (Signed)
Physical Therapy Note  Patient Details  Name: [name] MRN: [id number] Date of Birth: [birth year] Today's Date: 12/10/2020    Unavailable due to dialysis.  Teena Irani, PTA/CLT [phone]   [name] B 12/10/2020, 2:29 PM

## 2020-12-10 NOTE — Procedures (Signed)
   HEMODIALYSIS TREATMENT NOTE:  S/p new RIJ TDC placed this morning after Dr. Constance Haw kindly worked patient into her schedule.  Cath tolerated prescribed flow with no problems and with stable pressures.  Kept even / no UF as per order.  Soft pressures initially, as pt missed morning MIdodrine.  BP did improve after noon dose of Midodrine.  All blood was returned.  Net UF +242cc.  No changes from pre-HD assessment.  Rockwell Alexandria, RN

## 2020-12-10 NOTE — Discharge Summary (Signed)
Physician Discharge Summary  [name] PJK:[id number] DOB: 07-Jun-1951 DOA: 11/02/2020  Admit date: 11/02/2020 Discharge date: 12/10/2020  Disposition:  SNF   Recommendations for Outpatient Follow-up:  1. PLEASE MAKE APPT WITH UROLOGIST FOR FOLEY REMOVAL / VOIDING TRIAL IN 1-2 WEEKS 2. PLEASE ARRANGE VASCULAR SURGERY CONSULT FOR AVF/AVG PLACEMENT FOR HD 3. RESUME HEMODIALYSIS SCHEDULE AS ARRANGED 4. AIR MATTRESS AND WOUND CARE CONSULT FOR SACRAL DECUBITUS  5. DIETITIAN CONSULT FOR SEVERE PROTEIN CALORIE MALNUTRITION 6. PT REQUIRES STRETCHER TRANSPORT FOR HEMODIALYSIS DUE TO SEVERE RECALCITRANT ORTHOSTATIC HYPOTENSION UNABLE TO SIT UP IN CHAIR FOR HD TREATMENTS.  7. RECOMMEND PALLIATIVE MEDICINE CONSULTATION FOR ADVANCED CARE PLANNING  8. FOLLOW UP WITH CARDIOLOGY IN 2 WEEKS REGARDING ATRIAL FIBRILLATION.    Discharge Condition: STABLE   CODE STATUS: FULL   DIET: renal diet recommended   Brief Hospitalization Summary: Please see all hospital notes, images, labs for full details of the hospitalization. Brief History: 70yo male with history of diabetes mellitus, CKD stage V started recently on dialysis Monday Wednesday Friday and still making urine, recent A. fib and was restarted on amiodarone metoprolol and Eliquis was sent fromSNFafter he was noted to have an episode of syncope while getting up to go to hemodialysis.On arrival to the ED his systolic blood pressures were in the 70 mmHg range. He is noted to have some diarrhea, but he has recently completed course of ciprofloxacin and Flagyl by 11/23. He was noted to have nonspecific colitis on pathology after recent colonoscopy on 11/17 and was started on prednisone taper which he continues to take at this time.He was just discharged after admission from 10/12/20 to 11/01/20 when he was treated for syncope from orthostasis as well as colitis as discussed above. He also required 5 units PRBC last admission. Hewas started on  Solu-Medrol 60 mg daily 11/19--per GI>>po 40 mg daily--wean by 10 mg per week--started30 mg daily on 11/02/20. He is now finished with steroids  ED Course:Patient had received 250 mL of normal saline fluid bolus with improvement in blood pressure readings. Manual blood pressures confirm systolics in the low 809 range and heart rates fluctuate in the low 100 range. He denies any palpitations or chest pains or shortness of breath. Laboratory data with stable hemoglobin levels noted and he is noted to be hypokalemic with potassium 2.9 and he has a lactic acid level 2.6.  Assessment/Plan: Orthostatic hypotension -continues to be orthostatic despite pyridostigmine, midodrine and florinef -this lead to repeat admission after d/c on 11/01/20 -10/13/20--EchoEF 98-33%ASNKN 1 diastolic dysfunction -addedabdominal binder and knee stockings -decrease metoprolol to once daily -am cortisol 23.1 -TSH 1.912 -not a candidate for Northera -case discussed with renal, Dr. Libby Maw d/c with stretcher HD until able to tolerate sitting and standing -11/30/20--discussed case with Loleta Books, Dr. Ace Gins additional to offer, agrees with our current treatment -OOB with each shift; Check orthostatic daily--remains orthostatic  Chronic Diarrhea/Colitis - RESOLVED -presented with diarrhea and LGIB last admission in Nov 2021 -improving with imodium -initially treated with cip/fl and then 4 weeks of steroid taper -10/15/20, 11/06/20 and12/22/21 GI pathogen panel neg -11/02/20 Cdiff neg -11/05/20 Stool O&P--neg -GI following -10/17/20 colonoscopy-area of moderately congested mucosa with ulceration was found in the sigmoid colon and in the descending colon.Bllood noted in rectum, sigmoid and descending colon -11/21/20 colonoscopy--polyps in asc colon & 2 polyps hepatic flexure, extensive circumferential ulcers in distal sigmoid -11/17/21Biopsies of rectum and sigmoid colon show active nonspecific  colitis w/ ulceration c/f medication induced (NSAIDs), ischemia, or stercoral  proctitis -12//22/21 TI and colon biopsies--neg for inflammation; +tubular adenmoa -Fecal elastase and qualitative fecal fat analysisordered by GI unrevealing -11/30/20--discussed case with Loleta Books, Dr. Ace Gins additional to offer.   Anemia of CKD -had rectal bleed last admit requiring 5 units PRBC -11/21/20--received 1 unit this admit -multifactorial including CKD, blood draws, chronic disease -iron studies consistent with ACD  ESRD -Biopsy results from outpatient nephrologist moderate to severe arterial nephrosclerosis with 75% global glomerulosclerosis, focal and segmental glomerular atrophic scarring and diffuse moderate to severe tubulointerstitial scarring --started recently on dialysis MWF --HD on 12/28--unable to remove fluid --having intradialytic hypotension already on midodrine-->having difficulty removing fluid -discussed with Estanislado Emms to position in sitting position for HD -TDC replaced 12/07/20 by Dr. Constance Haw, unfortunately 1/8 had clotted and was unable to use, nephrology working on solutions -RIGHT IJ TDC REPLACED BY DR BRIDGES ON 12/10/20 AND FUNCTIONING WELL.  PT WILL NEED CONSULT WITH VASCULAR SURGERY FOR PERMANENT ACCESS PLACEMENT.    Recent paroxysmal A. fib --continue amiodarone for heart rate control --Eliquis--holdingdue to his rectal bleeding and acute blood loss anemia --Patient understands risks and benefits of holding anticoagulation and he seems to be reluctant to start it again-we will need close follow-up with his cardiologist/PCP. --started metoprolol 12.5 mg once daily as that is all he will tolerate due to orthostatic hypotension  Hyponatremia, --sodiumis improving with HD treatment.  Hyperlipidemia: --Continue statin therapy.   Acute urinary retention- s/p foley placement 12/16. Foley removed 12/23 and he had been voiding. Unfortunately he  developed acute urinary retention again on 12/25 and foley was replaced with >900 mL urine relieved. Will leave foley in place for now until he can follow up outpatient with urology.  Intermittent Hematuria/CAUTI - TREATED -likely due to foley trauma and intermittent movement/UTI -anchor foley -am CBC--Hgb stable -UA>50WBC -urine culture= serratia -completed course of ceftriaxone  Goals of Care -long discussion with patient's daughter at bedside 11/29/20 Advance care planning, including the explanation and discussion of advance directives was carried out with the patient and family. Code status including explanations of "Full Code" and "DNR" and alternatives were discussed in detail. Discussion of end-of-life issues including but not limited palliative care, hospice care and the concept of hospice, other end-of-life care options, power of attorney for health care decisions, living wills, and physician orders for life-sustaining treatment were also discussed with the patient and family. Total face to face time 36 minutes. -patient wants to continue full scope of care -12/02/19--confirms full scope of care; Plan to d/c to SNF and stretcher HD facility  Discharge Diagnoses:  Active Problems:   Orthostatic hypotension   Syncope due to orthostatic hypotension   ESRD on hemodialysis (HCC)   Colitis   Abdominal distension   Goals of care, counseling/discussion   Complication of vascular dialysis catheter   Problem with dialysis access [facility])  Discharge Instructions:  Allergies as of 12/10/2020   No Known Allergies     Medication List    STOP taking these medications   Phenergan 25 MG/ML injection Generic drug: promethazine   predniSONE 10 MG tablet Commonly known as: DELTASONE     TAKE these medications   acetaminophen 325 MG tablet Commonly known as: TYLENOL Take 2 tablets (650 mg total) by mouth every 6 (six) hours as needed for mild pain (or Fever >/= 101).   amiodarone  200 MG tablet Commonly known as: PACERONE Take 200 mg by mouth daily.   calcitRIOL 0.5 MCG capsule Commonly known as: ROCALTROL Take 1 capsule (  0.5 mcg total) by mouth daily. Start taking on: December 11, 2020   collagenase ointment Commonly known as: SANTYL Apply topically daily.   famotidine 20 MG tablet Commonly known as: PEPCID Take 1 tablet (20 mg total) by mouth daily. Start taking on: December 11, 2020   feeding supplement Liqd Take 237 mLs by mouth 2 (two) times daily between meals.   finasteride 5 MG tablet Commonly known as: PROSCAR Take 1 tablet (5 mg total) by mouth daily. Start taking on: December 11, 2020   fludrocortisone 0.1 MG tablet Commonly known as: FLORINEF Take 2 tablets (0.2 mg total) by mouth daily. Start taking on: December 11, 2020   metoprolol tartrate 25 MG tablet Commonly known as: LOPRESSOR Take 0.5 tablets (12.5 mg total) by mouth at bedtime. What changed: when to take this   midodrine 10 MG tablet Commonly known as: PROAMATINE Take 1 tablet (10 mg total) by mouth with breakfast, with lunch, and with evening meal.   multivitamin Tabs tablet Take 1 tablet by mouth at bedtime.   ondansetron 4 MG tablet Commonly known as: ZOFRAN Take 4 mg by mouth every 6 (six) hours as needed for nausea.   pantoprazole 40 MG tablet Commonly known as: PROTONIX Take 1 tablet (40 mg total) by mouth 2 (two) times daily before a meal. What changed: when to take this   psyllium 95 % Pack Commonly known as: HYDROCIL/METAMUCIL Take 1 packet by mouth at bedtime.   pyridostigmine 60 MG tablet Commonly known as: MESTINON Take 1 tablet (60 mg total) by mouth daily. Start taking on: December 11, 2020   simvastatin 20 MG tablet Commonly known as: ZOCOR Take 20 mg by mouth every evening.       No Known Allergies Allergies as of 12/10/2020   No Known Allergies     Medication List    STOP taking these medications   Phenergan 25 MG/ML injection Generic  drug: promethazine   predniSONE 10 MG tablet Commonly known as: DELTASONE     TAKE these medications   acetaminophen 325 MG tablet Commonly known as: TYLENOL Take 2 tablets (650 mg total) by mouth every 6 (six) hours as needed for mild pain (or Fever >/= 101).   amiodarone 200 MG tablet Commonly known as: PACERONE Take 200 mg by mouth daily.   calcitRIOL 0.5 MCG capsule Commonly known as: ROCALTROL Take 1 capsule (0.5 mcg total) by mouth daily. Start taking on: December 11, 2020   collagenase ointment Commonly known as: SANTYL Apply topically daily.   famotidine 20 MG tablet Commonly known as: PEPCID Take 1 tablet (20 mg total) by mouth daily. Start taking on: December 11, 2020   feeding supplement Liqd Take 237 mLs by mouth 2 (two) times daily between meals.   finasteride 5 MG tablet Commonly known as: PROSCAR Take 1 tablet (5 mg total) by mouth daily. Start taking on: December 11, 2020   fludrocortisone 0.1 MG tablet Commonly known as: FLORINEF Take 2 tablets (0.2 mg total) by mouth daily. Start taking on: December 11, 2020   metoprolol tartrate 25 MG tablet Commonly known as: LOPRESSOR Take 0.5 tablets (12.5 mg total) by mouth at bedtime. What changed: when to take this   midodrine 10 MG tablet Commonly known as: PROAMATINE Take 1 tablet (10 mg total) by mouth with breakfast, with lunch, and with evening meal.   multivitamin Tabs tablet Take 1 tablet by mouth at bedtime.   ondansetron 4 MG tablet Commonly known as: ZOFRAN Take  4 mg by mouth every 6 (six) hours as needed for nausea.   pantoprazole 40 MG tablet Commonly known as: PROTONIX Take 1 tablet (40 mg total) by mouth 2 (two) times daily before a meal. What changed: when to take this   psyllium 95 % Pack Commonly known as: HYDROCIL/METAMUCIL Take 1 packet by mouth at bedtime.   pyridostigmine 60 MG tablet Commonly known as: MESTINON Take 1 tablet (60 mg total) by mouth daily. Start taking on:  December 11, 2020   simvastatin 20 MG tablet Commonly known as: ZOCOR Take 20 mg by mouth every evening.       Procedures/Studies: MR BRAIN WO CONTRAST  Result Date: 11/16/2020 CLINICAL DATA:  Question deposition disease. Hypotension. Altered mental status. EXAM: MRI HEAD WITHOUT CONTRAST TECHNIQUE: Multiplanar, multiecho pulse sequences of the brain and surrounding structures were obtained without intravenous contrast. COMPARISON:  Head CT 01/25/2020 FINDINGS: Brain: Diffusion imaging does not show any acute or subacute infarction. No focal abnormality affects brainstem or cerebellum. Cerebral hemispheres show mild age related volume loss without evidence of small-vessel disease or large vessel infarction. No mass lesion, hemorrhage, hydrocephalus or extra-axial collection. Vascular: Major vessels at the base of the brain show flow. Skull and upper cervical spine: Negative Sinuses/Orbits: Clear/normal Other: None IMPRESSION: No acute or reversible finding. Mild age related volume loss. No evidence of small-vessel disease or large vessel infarction. Electronically Signed   By: Nelson Chimes M.D.   On: 11/16/2020 14:01   DG Chest Port 1 View  Result Date: 12/10/2020 CLINICAL DATA:  Status post exchange of a dialysis catheter. EXAM: PORTABLE CHEST 1 VIEW COMPARISON:  Single-view of the chest 12/07/2020. FINDINGS: New right IJ approach dialysis catheter is in place with its tip at the superior cavoatrial junction. No pneumothorax. Lungs are clear. Heart size is normal. No acute or focal bony abnormality. IMPRESSION: Dialysis catheter tip projects at the superior cavoatrial junction. Negative for pneumothorax or acute disease. Electronically Signed   By: Inge Rise M.D.   On: 12/10/2020 12:09   DG Chest Port 1 View  Result Date: 12/07/2020 CLINICAL DATA:  Post RIGHT-side dialysis catheter exchange EXAM: PORTABLE CHEST 1 VIEW COMPARISON:  Portable exam 1240 hours compared to 1003 hours FINDINGS:  RIGHT jugular dual-lumen central venous catheter with tip projecting over SVC. Normal heart size, mediastinal contours, and pulmonary vascularity. Atherosclerotic calcification aorta. Minimal chronic peribronchial thickening without pulmonary infiltrate, pleural effusion, or pneumothorax. Bones mildly demineralized. IMPRESSION: No pneumothorax following RIGHT jugular line placement. Minimal persistent bronchitic changes. Aortic Atherosclerosis (ICD10-I70.0). Electronically Signed   By: Lavonia Dana M.D.   On: 12/07/2020 12:55   DG Chest Port 1 View  Result Date: 12/06/2020 CLINICAL DATA:  End-stage renal disease. EXAM: PORTABLE CHEST 1 VIEW COMPARISON:  October 29, 2020. FINDINGS: The heart size and mediastinal contours are within normal limits. Both lungs are clear. Right internal jugular dialysis catheter is unchanged in position with distal tip in expected position of cavoatrial junction. The visualized skeletal structures are unremarkable. IMPRESSION: No active disease. Electronically Signed   By: Marijo Conception M.D.   On: 12/06/2020 10:34   DG Abd Portable 1V  Result Date: 11/28/2020 CLINICAL DATA:  Abdominal distension abdominal pain EXAM: PORTABLE ABDOMEN - 1 VIEW COMPARISON:  November 07, 2020 CT assessment, abdominal plain film from November 15, 2020 FINDINGS: Decreased distension of bowel loops in the abdomen when compared to the study from November 15, 2020. Mild distension currently of scattered gas-filled loops of small bowel.  Suggestion of small amount of proximal rectal gas. No acute skeletal process on limited assessment. Signs of basilar atelectasis. IMPRESSION: Decreased distension of bowel loops in the abdomen when compared to the study from November 15, 2020. Mild distension currently of scattered gas-filled loops of small bowel may reflect mild ileus. Suggestion of small amount of proximal rectal gas. Electronically Signed   By: Zetta Bills M.D.   On: 11/28/2020 16:15   DG Abd  Portable 1V  Result Date: 11/15/2020 CLINICAL DATA:  Abdominal distension EXAM: PORTABLE ABDOMEN - 1 VIEW COMPARISON:  10/25/2020 FINDINGS: The subdiaphragmatic region and right flank are excluded from view. Multiple gas-filled prominent loops of large and small bowel are seen throughout the visualized abdomen in keeping with un underlying ileus. No gross free intraperitoneal gas. Gas and stool is seen within the rectal vault. No organomegaly. No acute bone abnormality. IMPRESSION: Mild ileus. Electronically Signed   By: Fidela Salisbury MD   On: 11/15/2020 04:15   DG C-Arm 1-60 Min-No Report  Result Date: 12/10/2020 Fluoroscopy was utilized by the requesting physician.  No radiographic interpretation.   DG C-Arm 1-60 Min-No Report  Result Date: 12/07/2020 Fluoroscopy was utilized by the requesting physician.  No radiographic interpretation.      Subjective: Pt reports no specific complaints.  He is agreeable to SNF placement.   Discharge Exam: Vitals:   12/10/20 1430 12/10/20 1445  BP: 111/66 125/71  Pulse: 80 71  Resp:    Temp:    SpO2:     Vitals:   12/10/20 1400 12/10/20 1415 12/10/20 1430 12/10/20 1445  BP: (!) 96/56 (!) 86/49 111/66 125/71  Pulse: 78 86 80 71  Resp:      Temp:      TempSrc:      SpO2:      Weight:      Height:        General:  Pt is alert, follows commands appropriately, not in acute distress, affect less flat today  HEENT: No icterus, No thrush, No neck mass, [place]/AT  Cardiovascular: normal S1/S2, no rubs, no gallops  Respiratory: bibasilar rales. No wheeze  GU: foley in place.   Abdomen: Soft/+BS, non tender, non distended, no guarding  Extremities: No edema, No lymphangitis, No petechiae, No rashes, no synovitis   The results of significant diagnostics from this hospitalization (including imaging, microbiology, ancillary and laboratory) are listed below for reference.     Microbiology: Recent Results (from the past 240 hour(s))  Culture,  Urine     Status: Abnormal   Collection Time: 12/01/20  5:50 PM   Specimen: Urine, Catheterized  Result Value Ref Range Status   Specimen Description   Final    URINE, CATHETERIZED Performed at [facility], [address], [place], [place] [address]    Special Requests   Final    NONE Performed at [facility], [address], [place], [place] [address]    Culture >=100,000 COLONIES/mL SERRATIA MARCESCENS (A)  Final   Report Status 12/04/2020 FINAL  Final   Organism ID, Bacteria SERRATIA MARCESCENS (A)  Final      Susceptibility   Serratia marcescens - MIC*    CEFAZOLIN >=64 RESISTANT Resistant     CEFEPIME <=0.12 SENSITIVE Sensitive     CEFTRIAXONE 1 SENSITIVE Sensitive     CIPROFLOXACIN 2 INTERMEDIATE Intermediate     GENTAMICIN <=1 SENSITIVE Sensitive     NITROFURANTOIN 256 RESISTANT Resistant     TRIMETH/SULFA <=20 SENSITIVE Sensitive     * >=  100,000 COLONIES/mL SERRATIA MARCESCENS  SARS CORONAVIRUS 2 (TAT 6-24 HRS) Nasopharyngeal Nasopharyngeal Swab     Status: None   Collection Time: 12/06/20  4:50 PM   Specimen: Nasopharyngeal Swab  Result Value Ref Range Status   SARS Coronavirus 2 NEGATIVE NEGATIVE Final    Comment: (NOTE) SARS-CoV-2 target nucleic acids are NOT DETECTED.  The SARS-CoV-2 RNA is generally detectable in upper and lower respiratory specimens during the acute phase of infection. Negative results do not preclude SARS-CoV-2 infection, do not rule out co-infections with other pathogens, and should not be used as the sole basis for treatment or other patient management decisions. Negative results must be combined with clinical observations, patient history, and epidemiological information. The expected result is Negative.  Fact Sheet for Patients: SugarRoll.be  Fact Sheet for Healthcare Providers: https://www.woods-mathews.com/  This test is not yet approved or cleared by the [place] FDA and  has been  authorized for detection and/or diagnosis of SARS-CoV-2 by FDA under an Emergency Use Authorization (EUA). This EUA will remain  in effect (meaning this test can be used) for the duration of the COVID-19 declaration under Se ction 564(b)(1) of the Act, 21 U.S.C. section 360bbb-3(b)(1), unless the authorization is terminated or revoked sooner.  Performed at [facility], [place] [address], [place], [place] [address]   Surgical pcr screen     Status: None   Collection Time: 12/07/20  6:08 AM   Specimen: Nasal Mucosa; Nasal Swab  Result Value Ref Range Status   MRSA, PCR NEGATIVE NEGATIVE Final   Staphylococcus aureus NEGATIVE NEGATIVE Final    Comment: (NOTE) The Xpert SA Assay (FDA approved for NASAL specimens in patients 26 years of age and older), is one component of a comprehensive surveillance program. It is not intended to diagnose infection nor to guide or monitor treatment. Performed at [facility], [address], [place], [place] [address]      Labs: BNP (last 3 results) No results for input(s): BNP in the last 8760 hours. Basic Metabolic Panel: Recent Labs  Lab 12/04/20 0500 12/09/20 1446 12/10/20 0717  NA 131* 131* 134*  K 3.2* 3.1* 3.4*  CL 96* 98 98  CO2 24 22 23   GLUCOSE 98 130* 87  BUN 29* 41* 45*  CREATININE 4.15* 6.55* 6.88*  CALCIUM 7.6* 7.7* 7.7*  PHOS 3.3 4.5 5.1*   Liver Function Tests: Recent Labs  Lab 12/04/20 0500 12/09/20 1446 12/10/20 0717  ALBUMIN 2.2* 2.1* 2.1*   No results for input(s): LIPASE, AMYLASE in the last 168 hours. No results for input(s): AMMONIA in the last 168 hours. CBC: Recent Labs  Lab 12/09/20 1446  WBC 9.2  HGB 8.1*  HCT 26.9*  MCV 97.8  PLT 196   Cardiac Enzymes: No results for input(s): CKTOTAL, CKMB, CKMBINDEX, TROPONINI in the last 168 hours. BNP: Invalid input(s): POCBNP CBG: Recent Labs  Lab 12/07/20 1048 12/07/20 1113 12/07/20 1241 12/10/20 1202  GLUCAP 66* 111* 87 86   D-Dimer No  results for input(s): DDIMER in the last 72 hours. Hgb A1c No results for input(s): HGBA1C in the last 72 hours. Lipid Profile No results for input(s): CHOL, HDL, LDLCALC, TRIG, CHOLHDL, LDLDIRECT in the last 72 hours. Thyroid function studies No results for input(s): TSH, T4TOTAL, T3FREE, THYROIDAB in the last 72 hours.  Invalid input(s): FREET3 Anemia work up No results for input(s): VITAMINB12, FOLATE, FERRITIN, TIBC, IRON, RETICCTPCT in the last 72 hours. Urinalysis    Component Value Date/Time  COLORURINE AMBER (A) 12/01/2020 1750   APPEARANCEUR CLOUDY (A) 12/01/2020 1750   LABSPEC 1.008 12/01/2020 1750   PHURINE 6.0 12/01/2020 1750   GLUCOSEU NEGATIVE 12/01/2020 1750   HGBUR LARGE (A) 12/01/2020 1750   BILIRUBINUR NEGATIVE 12/01/2020 1750   KETONESUR NEGATIVE 12/01/2020 1750   PROTEINUR 100 (A) 12/01/2020 1750   NITRITE NEGATIVE 12/01/2020 1750   LEUKOCYTESUR LARGE (A) 12/01/2020 1750   Sepsis Labs Invalid input(s): PROCALCITONIN,  WBC,  LACTICIDVEN Microbiology Recent Results (from the past 240 hour(s))  Culture, Urine     Status: Abnormal   Collection Time: 12/01/20  5:50 PM   Specimen: Urine, Catheterized  Result Value Ref Range Status   Specimen Description   Final    URINE, CATHETERIZED Performed at [facility], [address], [place], [place] [address]    Special Requests   Final    NONE Performed at [facility], [address], [place], [place] [address]    Culture >=100,000 COLONIES/mL SERRATIA MARCESCENS (A)  Final   Report Status 12/04/2020 FINAL  Final   Organism ID, Bacteria SERRATIA MARCESCENS (A)  Final      Susceptibility   Serratia marcescens - MIC*    CEFAZOLIN >=64 RESISTANT Resistant     CEFEPIME <=0.12 SENSITIVE Sensitive     CEFTRIAXONE 1 SENSITIVE Sensitive     CIPROFLOXACIN 2 INTERMEDIATE Intermediate     GENTAMICIN <=1 SENSITIVE Sensitive     NITROFURANTOIN 256 RESISTANT Resistant     TRIMETH/SULFA <=20 SENSITIVE Sensitive     *  >=100,000 COLONIES/mL SERRATIA MARCESCENS  SARS CORONAVIRUS 2 (TAT 6-24 HRS) Nasopharyngeal Nasopharyngeal Swab     Status: None   Collection Time: 12/06/20  4:50 PM   Specimen: Nasopharyngeal Swab  Result Value Ref Range Status   SARS Coronavirus 2 NEGATIVE NEGATIVE Final    Comment: (NOTE) SARS-CoV-2 target nucleic acids are NOT DETECTED.  The SARS-CoV-2 RNA is generally detectable in upper and lower respiratory specimens during the acute phase of infection. Negative results do not preclude SARS-CoV-2 infection, do not rule out co-infections with other pathogens, and should not be used as the sole basis for treatment or other patient management decisions. Negative results must be combined with clinical observations, patient history, and epidemiological information. The expected result is Negative.  Fact Sheet for Patients: SugarRoll.be  Fact Sheet for Healthcare Providers: https://www.woods-mathews.com/  This test is not yet approved or cleared by the [place] FDA and  has been authorized for detection and/or diagnosis of SARS-CoV-2 by FDA under an Emergency Use Authorization (EUA). This EUA will remain  in effect (meaning this test can be used) for the duration of the COVID-19 declaration under Se ction 564(b)(1) of the Act, 21 U.S.C. section 360bbb-3(b)(1), unless the authorization is terminated or revoked sooner.  Performed at [facility], [place] [address] [place], [place] [address]   Surgical pcr screen     Status: None   Collection Time: 12/07/20  6:08 AM   Specimen: Nasal Mucosa; Nasal Swab  Result Value Ref Range Status   MRSA, PCR NEGATIVE NEGATIVE Final   Staphylococcus aureus NEGATIVE NEGATIVE Final    Comment: (NOTE) The Xpert SA Assay (FDA approved for NASAL specimens in patients 6 years of age and older), is one component of a comprehensive surveillance program. It is not intended to diagnose infection nor  to guide or monitor treatment. Performed at [facility], [address] [place], [place] [address]    Time coordinating discharge: 45 mins  SIGNED:  Irwin Brakeman, MD  [facility] 12/10/2020, 2:50 PM How to contact the [facility] Attending or Consulting provider [place] or covering provider during after hours [place], for this patient?  1. Check the care team in [facility] and look for a) attending/consulting TR[facility] provider listed and b) the [facility] team listed 2. Log into [email or web address] and use [place]'s universal password to access. If you do not have the password, please contact the hospital operator. 3. Locate the [facility] provider you are looking for under [facility] and page to a number that you can be directly reached. 4. If you still have difficulty reaching the provider, please page the [facility] (Director on Call) for the Hospitalists listed on amion for assistance.

## 2020-12-10 NOTE — Progress Notes (Signed)
Upon arrival to room 223, sheets on bed were change due to appearing soiled. Patient then transferred to bed via draw sheet by myself, Lillia Carmel, RN. Positioned for comfort in bed, 300 RN notified patient back in room, call bell in patient;s hand and bed in low position with side rails x 2 up, bedside table to side of bed per patient's preference. 300 RN in room

## 2020-12-10 NOTE — Progress Notes (Signed)
[facility]  CXR looks good without pneumthorax and catheter in good position. Dialysis hopefully today and can dc from there.  Curlene Labrum, MD [facility] [address] [place], [place] [address] [phone] (office)

## 2020-12-10 NOTE — Progress Notes (Signed)
Attempted to contact [place] to give report.  Numbers dialed were [phone] and [phone].  No answer from either lines.

## 2020-12-10 NOTE — Progress Notes (Signed)
Patient ID: EVERT WENRICH, male   DOB: Apr 01, 1951, 70 y.o.   MRN: [id number] S: Feels "okay", no events overnight. O:BP 120/62 (BP Location: Right Arm)   Pulse 65   Temp 98.1 F (36.7 C) (Oral)   Resp 18   Ht 6\' 2"  (1.88 m)   Wt 66.2 kg   SpO2 97%   BMI 18.74 kg/m   Intake/Output Summary (Last 24 hours) at 12/10/2020 0903 Last data filed at 12/10/2020 9323 Gross per 24 hour  Intake 480 ml  Output 400 ml  Net 80 ml   Intake/Output: I/O last 3 completed shifts: In: 720 [P.O.:720] Out: 400 [Urine:400]  Intake/Output this shift:  No intake/output data recorded. Weight change: 0.1 kg Gen: NAD CVS: RRR Resp: cta Abd: BS, soft, NT/ND Ext: trace ankle edema bilaterally  Recent Labs  Lab 12/04/20 0500 12/09/20 1446 12/10/20 0717  NA 131* 131* 134*  K 3.2* 3.1* 3.4*  CL 96* 98 98  CO2 24 22 23   GLUCOSE 98 130* 87  BUN 29* 41* 45*  CREATININE 4.15* 6.55* 6.88*  ALBUMIN 2.2* 2.1* 2.1*  CALCIUM 7.6* 7.7* 7.7*  PHOS 3.3 4.5 5.1*   Liver Function Tests: Recent Labs  Lab 12/04/20 0500 12/09/20 1446 12/10/20 0717  ALBUMIN 2.2* 2.1* 2.1*   No results for input(s): LIPASE, AMYLASE in the last 168 hours. No results for input(s): AMMONIA in the last 168 hours. CBC: Recent Labs  Lab 12/09/20 1446  WBC 9.2  HGB 8.1*  HCT 26.9*  MCV 97.8  PLT 196   Cardiac Enzymes: No results for input(s): CKTOTAL, CKMB, CKMBINDEX, TROPONINI in the last 168 hours. CBG: Recent Labs  Lab 12/07/20 1048 12/07/20 1113 12/07/20 1241  GLUCAP 66* 111* 87    Iron Studies: No results for input(s): IRON, TIBC, TRANSFERRIN, FERRITIN in the last 72 hours. Studies/Results: No results found. Marland Kitchen amiodarone  200 mg Oral Daily  . calcitRIOL  0.5 mcg Oral Daily  . Chlorhexidine Gluconate Cloth  6 each Topical Q0600  . Chlorhexidine Gluconate Cloth  6 each Topical Q0600  . collagenase   Topical Daily  . darbepoetin (ARANESP) injection - DIALYSIS  200 mcg Intravenous Q Mon-HD  . famotidine  20 mg  Oral Daily  . feeding supplement  237 mL Oral BID BM  . finasteride  5 mg Oral Daily  . fludrocortisone  0.2 mg Oral Daily  . metoprolol tartrate  12.5 mg Oral QHS  . midodrine  10 mg Oral TID with meals  . multivitamin  1 tablet Oral QHS  . pantoprazole  40 mg Oral BID AC  . psyllium  1 packet Oral QHS  . pyridostigmine  60 mg Oral Daily  . simvastatin  20 mg Oral QPM  . sodium chloride flush  3 mL Intravenous Q12H    BMET    Component Value Date/Time   NA 134 (L) 12/10/2020 0717   K 3.4 (L) 12/10/2020 0717   CL 98 12/10/2020 0717   CO2 23 12/10/2020 0717   GLUCOSE 87 12/10/2020 0717   BUN 45 (H) 12/10/2020 0717   CREATININE 6.88 (H) 12/10/2020 0717   CALCIUM 7.7 (L) 12/10/2020 0717   GFRNONAA 8 (L) 12/10/2020 0717   GFRAA 19 (L) 06/15/2018 0407   CBC    Component Value Date/Time   WBC 9.2 12/09/2020 1446   RBC 2.75 (L) 12/09/2020 1446   HGB 8.1 (L) 12/09/2020 1446   HCT 26.9 (L) 12/09/2020 1446   PLT 196 12/09/2020 1446  MCV 97.8 12/09/2020 1446   MCH 29.5 12/09/2020 1446   MCHC 30.1 12/09/2020 1446   RDW 19.6 (H) 12/09/2020 1446   LYMPHSABS 0.8 11/28/2020 0459   MONOABS 1.2 (H) 11/28/2020 0459   EOSABS 0.0 11/28/2020 0459   BASOSABS 0.0 11/28/2020 0459     Assessment/Plan:  1. Orthostatic hypotension/autonomic dysfunction- remains symptomatic but BP's improving with combination of midodrine, fludrocortisone, and pyridostigmine.  Continue with compression stockings and abdominal binder.  Consider adding strattera (atomoxetine) 18 mg in combination with pyridostigmine for synergistic effects if no improvement.  2. ESRD- continue with MWF HD for now. 3. Vascular access- RIJ TDC replaced last week but did not function well on Saturday.  Will consult Dr. Constance Haw to replace.  If he wishes to pursue ongoing HD will need an AVF/AVG placement. 4. Sacral decubitus ulcer- unstageable per wound care.  They also recommended surgical debridement.  Will consult surgery.  THis  may interfere with outpatient HD and will likely need stretcher dialysis. 5. CKD-MBD- stable 6. Severe protein malnutrition- supplemental protein 7. Hyponatremia- stable 8. Anemia of ESRD- continue with ESA 9. Urinary retention- now with foley catheter. 10. Disposition- pending ability to safely ambulate or stretcher dialysis and SNF placement all pending.   Donetta Potts, MD [name] [phone]

## 2020-12-10 NOTE — Anesthesia Postprocedure Evaluation (Signed)
Anesthesia Post Note  Patient: [name]  Procedure(s) Performed: INSERTION OF DIALYSIS CATHETER (Right Chest)  Patient location during evaluation: PACU Anesthesia Type: General Level of consciousness: awake and alert and oriented Pain management: pain level controlled Vital Signs Assessment: post-procedure vital signs reviewed and stable Respiratory status: spontaneous breathing Cardiovascular status: blood pressure returned to baseline and stable Postop Assessment: no apparent nausea or vomiting Anesthetic complications: no   No complications documented.   Last Vitals:  Vitals:   12/10/20 1400 12/10/20 1415  BP: (!) 96/56 (!) 86/49  Pulse: 78 86  Resp:    Temp:    SpO2:      Last Pain:  Vitals:   12/10/20 1330  TempSrc: Oral  PainSc: 0-No pain                 Dellamae Rosamilia

## 2020-12-10 NOTE — Transfer of Care (Signed)
Immediate Anesthesia Transfer of Care Note  Patient: [name]  Procedure(s) Performed: INSERTION OF DIALYSIS CATHETER (Right Chest)  Patient Location: PACU  Anesthesia Type:General  Level of Consciousness: awake  Airway & Oxygen Therapy: Patient Spontanous Breathing  Post-op Assessment: Report given to RN  Post vital signs: Reviewed and stable  Last Vitals:  Vitals Value Taken Time  BP 120/68 12/10/20 1155  Temp    Pulse 70 12/10/20 1158  Resp 9 12/10/20 1158  SpO2 98 % 12/10/20 1158  Vitals shown include unvalidated device data.  Last Pain:  Vitals:   12/10/20 1109  TempSrc: Oral  PainSc: 0-No pain      Patients Stated Pain Goal: 7 (67/01/10 0349)  Complications: No complications documented.

## 2020-12-10 NOTE — Interval H&P Note (Signed)
History and Physical Interval Note:  12/10/2020 10:19 AM  [name]  has presented today for surgery, with the diagnosis of malfuncitoning dialysis catheter.  The various methods of treatment have been discussed with the patient and family. After consideration of risks, benefits and other options for treatment, the patient has consented to  Procedure(s): INSERTION OF DIALYSIS CATHETER (Right) as a surgical intervention.  The patient's history has been reviewed, patient examined, no change in status, stable for surgery.  I have reviewed the patient's chart and labs.  Questions were answered to the patient's satisfaction.    Replacement catheter is kinked at the neck from a high venotomy at OSH. Will need to replace with a lower venotomy site. Sacral decubitus looked at and minimal debridement done. Santyl and dressing change done.    Prior catheter removed and RN held pressure for 30 minutes following    Virl Cagey

## 2020-12-10 NOTE — Op Note (Signed)
Operative Note 12/10/20   Preoperative Diagnosis: End Stage Renal Disease    Postoperative Diagnosis: Same   Procedure(s) Performed: Tunneled Dialysis Catheter Placement, Right Internal Jugular, New Venotomy lower down from prior    Surgeon: Lanell Matar. Constance Haw, MD   Assistants: No qualified resident was available   Anesthesia: Monitored anesthesia care   Anesthesiologist: Denese Killings, MD    Specimens: None   Estimated Blood Loss: Minimal   Fluoroscopy time: 6 seconds   Blood Replacement: None    Complications: None    Operative Findings: Normal anatomy, prior venotomy site high on neck, replaced with new site   Indications: Mr. Rode is a 70 yo with a dialysis catheter placed at OSH in October with a high venotomy site. The catheter had malfunctioned and had poor flows thought to be from fibrin sheath. This was exchanged on Friday. I used the previous tunnel and venotomy site and exchanged the catheter over a wire. Due to the high venotomy site and trajectory of the tunnel the catheter did have issues with some kinking but this had been resolved in the OR. Unfortunately this weekend with dialysis there was no flow back in the catheter, likely from kinking at the neck.  Due to this we opted to replace the catheter with a lower venotomy site on the right. The prior catheter was removed earlier in the day and pressure was held for 30 minutes without any hematoma formation.I discussed a new catheter with the patient and risk of bleeding, infection, injury to vessels and pneumothorax.   Procedure: The patient was brought into the operating room and monitored anesthesia care was induced.   The right chest and neck was prepped and draped in the usual sterile fashion.  Preoperative antibiotics were given.   An Ultrasound was used to verify that the right internal jugular vein was patent.  One percent lidocaine was used for local anesthesia.  The patient was measured and a 23 cm  Palindrome dual lumen dialysis catheter.  The needles advanced into the right internal jugular vein using the Seldinger technique without difficulty in a point lower down on the neck closer to the clavicle from his prior site.  A guidewire was then advanced into the right atrium under fluoroscopic guidance.  Ectopia was not noted.  The wire was secured.  An incision was made over the right chest and the catheter was tunneled to the neck.  The ultrasound again confirmed the wire was going into the vein only. Dilators were used over the wire to dilate the track.  An introducer and peel-away sheath were placed over the guidewire. The catheter was then inserted through the peel-away sheath and the peel-away sheath was removed.  A spot film was performed to confirm the position.  The catheter drew back and flushed easily. The lumens were packed with heparin. Hemostats were used to position the catheter in the neck incision. The neck incision was closed with 4-0 Monocryl and Dermabond. The catheter was secured with 2-0 silk suture and a sterile Biopatch and dressing was applied.  Hemostasis was confirmed.     All tape and needle counts were correct at the end of the procedure. The patient was transferred to PACU in stable condition. A chest x-ray will be performed at that time.  Curlene Labrum, MD [facility] [address] [place],  [address] [phone] (office)

## 2020-12-11 DIAGNOSIS — Y712 Prosthetic and other implants, materials and accessory cardiovascular devices associated with adverse incidents: Secondary | ICD-10-CM | POA: Diagnosis not present

## 2020-12-11 DIAGNOSIS — G9340 Encephalopathy, unspecified: Secondary | ICD-10-CM | POA: Diagnosis not present

## 2020-12-11 DIAGNOSIS — R112 Nausea with vomiting, unspecified: Secondary | ICD-10-CM | POA: Diagnosis not present

## 2020-12-11 DIAGNOSIS — I120 Hypertensive chronic kidney disease with stage 5 chronic kidney disease or end stage renal disease: Secondary | ICD-10-CM | POA: Diagnosis present

## 2020-12-11 DIAGNOSIS — R6521 Severe sepsis with septic shock: Secondary | ICD-10-CM | POA: Diagnosis not present

## 2020-12-11 DIAGNOSIS — Z23 Encounter for immunization: Secondary | ICD-10-CM | POA: Diagnosis not present

## 2020-12-11 DIAGNOSIS — J9600 Acute respiratory failure, unspecified whether with hypoxia or hypercapnia: Secondary | ICD-10-CM | POA: Diagnosis not present

## 2020-12-11 DIAGNOSIS — Z66 Do not resuscitate: Secondary | ICD-10-CM | POA: Diagnosis not present

## 2020-12-11 DIAGNOSIS — U071 COVID-19: Secondary | ICD-10-CM | POA: Diagnosis not present

## 2020-12-11 DIAGNOSIS — R140 Abdominal distension (gaseous): Secondary | ICD-10-CM | POA: Diagnosis present

## 2020-12-11 DIAGNOSIS — Y846 Urinary catheterization as the cause of abnormal reaction of the patient, or of later complication, without mention of misadventure at the time of the procedure: Secondary | ICD-10-CM | POA: Diagnosis not present

## 2020-12-11 DIAGNOSIS — R5381 Other malaise: Secondary | ICD-10-CM | POA: Diagnosis not present

## 2020-12-11 DIAGNOSIS — M2550 Pain in unspecified joint: Secondary | ICD-10-CM | POA: Diagnosis not present

## 2020-12-11 DIAGNOSIS — J9601 Acute respiratory failure with hypoxia: Secondary | ICD-10-CM | POA: Diagnosis present

## 2020-12-11 DIAGNOSIS — A4189 Other specified sepsis: Secondary | ICD-10-CM | POA: Diagnosis not present

## 2020-12-11 DIAGNOSIS — K529 Noninfective gastroenteritis and colitis, unspecified: Secondary | ICD-10-CM | POA: Diagnosis not present

## 2020-12-11 DIAGNOSIS — K559 Vascular disorder of intestine, unspecified: Secondary | ICD-10-CM | POA: Diagnosis present

## 2020-12-11 DIAGNOSIS — Z7401 Bed confinement status: Secondary | ICD-10-CM | POA: Diagnosis not present

## 2020-12-11 DIAGNOSIS — K52831 Collagenous colitis: Secondary | ICD-10-CM | POA: Diagnosis present

## 2020-12-11 DIAGNOSIS — R109 Unspecified abdominal pain: Secondary | ICD-10-CM | POA: Diagnosis not present

## 2020-12-11 DIAGNOSIS — E1122 Type 2 diabetes mellitus with diabetic chronic kidney disease: Secondary | ICD-10-CM | POA: Diagnosis present

## 2020-12-11 DIAGNOSIS — E119 Type 2 diabetes mellitus without complications: Secondary | ICD-10-CM | POA: Diagnosis present

## 2020-12-11 DIAGNOSIS — R52 Pain, unspecified: Secondary | ICD-10-CM | POA: Diagnosis not present

## 2020-12-11 DIAGNOSIS — N186 End stage renal disease: Secondary | ICD-10-CM | POA: Diagnosis present

## 2020-12-11 DIAGNOSIS — Z992 Dependence on renal dialysis: Secondary | ICD-10-CM | POA: Diagnosis not present

## 2020-12-11 DIAGNOSIS — Y92239 Unspecified place in hospital as the place of occurrence of the external cause: Secondary | ICD-10-CM | POA: Diagnosis not present

## 2020-12-11 DIAGNOSIS — I959 Hypotension, unspecified: Secondary | ICD-10-CM | POA: Diagnosis present

## 2020-12-11 DIAGNOSIS — K633 Ulcer of intestine: Secondary | ICD-10-CM | POA: Diagnosis present

## 2020-12-11 DIAGNOSIS — J9602 Acute respiratory failure with hypercapnia: Secondary | ICD-10-CM | POA: Diagnosis not present

## 2020-12-11 DIAGNOSIS — K551 Chronic vascular disorders of intestine: Secondary | ICD-10-CM | POA: Diagnosis not present

## 2020-12-11 DIAGNOSIS — K631 Perforation of intestine (nontraumatic): Secondary | ICD-10-CM | POA: Diagnosis present

## 2020-12-11 DIAGNOSIS — N2581 Secondary hyperparathyroidism of renal origin: Secondary | ICD-10-CM | POA: Diagnosis not present

## 2020-12-11 DIAGNOSIS — L89159 Pressure ulcer of sacral region, unspecified stage: Secondary | ICD-10-CM | POA: Diagnosis not present

## 2020-12-11 DIAGNOSIS — I4891 Unspecified atrial fibrillation: Secondary | ICD-10-CM | POA: Diagnosis present

## 2020-12-11 DIAGNOSIS — Z743 Need for continuous supervision: Secondary | ICD-10-CM | POA: Diagnosis not present

## 2020-12-11 DIAGNOSIS — R1111 Vomiting without nausea: Secondary | ICD-10-CM | POA: Diagnosis not present

## 2020-12-11 DIAGNOSIS — R69 Illness, unspecified: Secondary | ICD-10-CM | POA: Diagnosis not present

## 2020-12-11 DIAGNOSIS — K626 Ulcer of anus and rectum: Secondary | ICD-10-CM | POA: Diagnosis present

## 2020-12-11 DIAGNOSIS — I480 Paroxysmal atrial fibrillation: Secondary | ICD-10-CM | POA: Diagnosis not present

## 2020-12-11 DIAGNOSIS — R9431 Abnormal electrocardiogram [ECG] [EKG]: Secondary | ICD-10-CM | POA: Diagnosis not present

## 2020-12-11 DIAGNOSIS — R64 Cachexia: Secondary | ICD-10-CM | POA: Diagnosis present

## 2020-12-11 DIAGNOSIS — A09 Infectious gastroenteritis and colitis, unspecified: Secondary | ICD-10-CM | POA: Diagnosis present

## 2020-12-11 DIAGNOSIS — J9690 Respiratory failure, unspecified, unspecified whether with hypoxia or hypercapnia: Secondary | ICD-10-CM | POA: Diagnosis not present

## 2020-12-11 DIAGNOSIS — Z7189 Other specified counseling: Secondary | ICD-10-CM | POA: Diagnosis not present

## 2020-12-11 DIAGNOSIS — D62 Acute posthemorrhagic anemia: Secondary | ICD-10-CM | POA: Diagnosis present

## 2020-12-11 DIAGNOSIS — K567 Ileus, unspecified: Secondary | ICD-10-CM | POA: Diagnosis not present

## 2020-12-11 DIAGNOSIS — R609 Edema, unspecified: Secondary | ICD-10-CM | POA: Diagnosis not present

## 2020-12-11 DIAGNOSIS — E43 Unspecified severe protein-calorie malnutrition: Secondary | ICD-10-CM | POA: Diagnosis present

## 2020-12-11 DIAGNOSIS — D696 Thrombocytopenia, unspecified: Secondary | ICD-10-CM | POA: Diagnosis not present

## 2020-12-11 DIAGNOSIS — R279 Unspecified lack of coordination: Secondary | ICD-10-CM | POA: Diagnosis not present

## 2020-12-11 DIAGNOSIS — R110 Nausea: Secondary | ICD-10-CM | POA: Diagnosis not present

## 2020-12-11 DIAGNOSIS — E785 Hyperlipidemia, unspecified: Secondary | ICD-10-CM | POA: Diagnosis not present

## 2020-12-11 DIAGNOSIS — K668 Other specified disorders of peritoneum: Secondary | ICD-10-CM | POA: Diagnosis not present

## 2020-12-11 DIAGNOSIS — Z114 Encounter for screening for human immunodeficiency virus [HIV]: Secondary | ICD-10-CM | POA: Diagnosis not present

## 2020-12-11 DIAGNOSIS — R55 Syncope and collapse: Secondary | ICD-10-CM | POA: Diagnosis present

## 2020-12-11 DIAGNOSIS — Z515 Encounter for palliative care: Secondary | ICD-10-CM | POA: Diagnosis not present

## 2020-12-11 DIAGNOSIS — L89154 Pressure ulcer of sacral region, stage 4: Secondary | ICD-10-CM | POA: Diagnosis present

## 2020-12-11 DIAGNOSIS — E872 Acidosis: Secondary | ICD-10-CM | POA: Diagnosis present

## 2020-12-11 DIAGNOSIS — R7401 Elevation of levels of liver transaminase levels: Secondary | ICD-10-CM | POA: Diagnosis not present

## 2020-12-11 DIAGNOSIS — J90 Pleural effusion, not elsewhere classified: Secondary | ICD-10-CM | POA: Diagnosis present

## 2020-12-11 DIAGNOSIS — R531 Weakness: Secondary | ICD-10-CM | POA: Diagnosis not present

## 2020-12-11 DIAGNOSIS — R1110 Vomiting, unspecified: Secondary | ICD-10-CM | POA: Diagnosis not present

## 2020-12-11 DIAGNOSIS — L0390 Cellulitis, unspecified: Secondary | ICD-10-CM | POA: Diagnosis not present

## 2020-12-11 DIAGNOSIS — D631 Anemia in chronic kidney disease: Secondary | ICD-10-CM | POA: Diagnosis not present

## 2020-12-11 NOTE — Progress Notes (Signed)
Patient labs not done, patient is discharged, unable to be picked up by EMS and facility did not answer phone calls. Patient did not want to have labs done this am.  Labs not done.

## 2020-12-12 ENCOUNTER — Encounter (HOSPITAL_COMMUNITY): Payer: Self-pay | Admitting: General Surgery

## 2020-12-13 DIAGNOSIS — I4891 Unspecified atrial fibrillation: Secondary | ICD-10-CM | POA: Diagnosis not present

## 2020-12-13 DIAGNOSIS — N186 End stage renal disease: Secondary | ICD-10-CM | POA: Diagnosis not present

## 2020-12-13 DIAGNOSIS — L89159 Pressure ulcer of sacral region, unspecified stage: Secondary | ICD-10-CM | POA: Diagnosis not present

## 2020-12-13 DIAGNOSIS — K529 Noninfective gastroenteritis and colitis, unspecified: Secondary | ICD-10-CM | POA: Diagnosis not present

## 2020-12-13 DIAGNOSIS — I959 Hypotension, unspecified: Secondary | ICD-10-CM | POA: Diagnosis not present

## 2020-12-13 DIAGNOSIS — R5381 Other malaise: Secondary | ICD-10-CM | POA: Diagnosis not present

## 2020-12-14 DIAGNOSIS — N186 End stage renal disease: Secondary | ICD-10-CM | POA: Diagnosis not present

## 2020-12-14 DIAGNOSIS — E785 Hyperlipidemia, unspecified: Secondary | ICD-10-CM | POA: Diagnosis not present

## 2020-12-14 DIAGNOSIS — I4891 Unspecified atrial fibrillation: Secondary | ICD-10-CM | POA: Diagnosis not present

## 2020-12-14 DIAGNOSIS — R5381 Other malaise: Secondary | ICD-10-CM | POA: Diagnosis not present

## 2020-12-14 DIAGNOSIS — D631 Anemia in chronic kidney disease: Secondary | ICD-10-CM | POA: Diagnosis not present

## 2020-12-14 DIAGNOSIS — K529 Noninfective gastroenteritis and colitis, unspecified: Secondary | ICD-10-CM | POA: Diagnosis not present

## 2020-12-14 DIAGNOSIS — L89159 Pressure ulcer of sacral region, unspecified stage: Secondary | ICD-10-CM | POA: Diagnosis not present

## 2020-12-14 DIAGNOSIS — N2581 Secondary hyperparathyroidism of renal origin: Secondary | ICD-10-CM | POA: Diagnosis not present

## 2020-12-15 DIAGNOSIS — N2581 Secondary hyperparathyroidism of renal origin: Secondary | ICD-10-CM | POA: Diagnosis not present

## 2020-12-15 DIAGNOSIS — D631 Anemia in chronic kidney disease: Secondary | ICD-10-CM | POA: Diagnosis not present

## 2020-12-15 DIAGNOSIS — N186 End stage renal disease: Secondary | ICD-10-CM | POA: Diagnosis not present

## 2020-12-15 DIAGNOSIS — E785 Hyperlipidemia, unspecified: Secondary | ICD-10-CM | POA: Diagnosis not present

## 2020-12-18 ENCOUNTER — Other Ambulatory Visit: Payer: Self-pay

## 2020-12-18 ENCOUNTER — Emergency Department (HOSPITAL_COMMUNITY)
Admission: EM | Admit: 2020-12-18 | Discharge: 2020-12-19 | Disposition: A | Discharge status: Home / Self Care | Payer: Medicare Other | Source: Home / Self Care | Attending: Emergency Medicine | Admitting: Emergency Medicine

## 2020-12-18 ENCOUNTER — Encounter (HOSPITAL_COMMUNITY): Payer: Self-pay | Admitting: Emergency Medicine

## 2020-12-18 DIAGNOSIS — E1122 Type 2 diabetes mellitus with diabetic chronic kidney disease: Secondary | ICD-10-CM | POA: Insufficient documentation

## 2020-12-18 DIAGNOSIS — N186 End stage renal disease: Secondary | ICD-10-CM | POA: Diagnosis not present

## 2020-12-18 DIAGNOSIS — Z992 Dependence on renal dialysis: Secondary | ICD-10-CM | POA: Insufficient documentation

## 2020-12-18 DIAGNOSIS — R55 Syncope and collapse: Secondary | ICD-10-CM | POA: Diagnosis not present

## 2020-12-18 DIAGNOSIS — E1140 Type 2 diabetes mellitus with diabetic neuropathy, unspecified: Secondary | ICD-10-CM | POA: Insufficient documentation

## 2020-12-18 LAB — BASIC METABOLIC PANEL
Anion gap: 16 — ABNORMAL HIGH (ref 5–15)
BUN: 54 mg/dL — ABNORMAL HIGH (ref 8–23)
CO2: 18 mmol/L — ABNORMAL LOW (ref 22–32)
Calcium: 7.1 mg/dL — ABNORMAL LOW (ref 8.9–10.3)
Chloride: 102 mmol/L (ref 98–111)
Creatinine, Ser: 5.83 mg/dL — ABNORMAL HIGH (ref 0.61–1.24)
GFR, Estimated: 10 mL/min — ABNORMAL LOW (ref >60)
Glucose, Bld: 73 mg/dL (ref 70–99)
Potassium: 4 mmol/L (ref 3.5–5.1)
Sodium: 136 mmol/L (ref 135–145)

## 2020-12-18 LAB — CBC WITH DIFFERENTIAL/PLATELET
Abs Immature Granulocytes: 0.05 10*3/uL (ref 0.00–0.07)
Basophils Absolute: 0 10*3/uL (ref 0.0–0.1)
Basophils Relative: 0 %
Eosinophils Absolute: 0 10*3/uL (ref 0.0–0.5)
Eosinophils Relative: 0 %
HCT: 30 % — ABNORMAL LOW (ref 39.0–52.0)
Hemoglobin: 8.8 g/dL — ABNORMAL LOW (ref 13.0–17.0)
Immature Granulocytes: 1 %
Lymphocytes Relative: 9 %
Lymphs Abs: 0.7 10*3/uL (ref 0.7–4.0)
MCH: 29.8 pg (ref 26.0–34.0)
MCHC: 29.3 g/dL — ABNORMAL LOW (ref 30.0–36.0)
MCV: 101.7 fL — ABNORMAL HIGH (ref 80.0–100.0)
Monocytes Absolute: 0.7 10*3/uL (ref 0.1–1.0)
Monocytes Relative: 9 %
Neutro Abs: 5.8 10*3/uL (ref 1.7–7.7)
Neutrophils Relative %: 81 %
Platelets: 120 10*3/uL — ABNORMAL LOW (ref 150–400)
RBC: 2.95 MIL/uL — ABNORMAL LOW (ref 4.22–5.81)
RDW: 21.5 % — ABNORMAL HIGH (ref 11.5–15.5)
WBC Morphology: INCREASED
WBC: 7.2 10*3/uL (ref 4.0–10.5)
nRBC: 0 % (ref 0.0–0.2)

## 2020-12-18 MED ORDER — MIDODRINE HCL 5 MG PO TABS
10.0000 mg | ORAL_TABLET | Freq: Once | ORAL | Status: AC
  Administered 2020-12-18: 10 mg via ORAL
  Filled 2020-12-18: qty 2

## 2020-12-18 NOTE — ED Triage Notes (Addendum)
Arrived via EMS from [place] [facility]. Patient sent to the hospital via [facility] because regular days are Tues/Thurs/Sat. Last dialysis was Sat. [facility] refused to take patient to dialysis center for unknown reason and [facility] sent patient to the ED for dialysis. Patient has no complaints. EMS VS: BP 136/80  HR 78 100% RA 98.3 F.

## 2020-12-18 NOTE — ED Provider Notes (Signed)
[facility] EMERGENCY DEPARTMENT Provider Note   CSN: [id number] Arrival date & time: 12/18/20  1129     History Chief Complaint  Patient presents with  . Vascular Access Problem    Needs dialysis regular treatment on Tuesday     [name] is a 70 y.o. male.  Presents to ER with concern for transportation issues to dialysis.  Patient recently discharged from [facility] to [facility] He has severe orthostatic hypotension, decreased mobility and it was recommended that he receive stretcher dialysis and stretcher transportation. According to our renal navigator social work, the prior authorization for transportation was not appropriately set up. When PT are declined to transport patient for dialysis today, the facility sent patient to ER for evaluation by EMS.  When I evaluated patient, he has no acute complaints.  HPI     Past Medical History:  Diagnosis Date  . Diabetes mellitus, type 2 ([place])   . ESRD on hemodialysis ([place])   . GI bleed   . Orthostatic hypotension   . PAF (paroxysmal atrial fibrillation) [facility])     Patient Active Problem List   Diagnosis Date Noted  . Problem with dialysis access ([place])   . Complication of vascular dialysis catheter   . Goals of care, counseling/discussion 11/29/2020  . Abdominal distension   . Colitis   . ESRD on hemodialysis ([place])   . Syncope due to orthostatic hypotension 11/02/2020  . Rectal bleeding   . Acute on chronic anemia   . Diarrhea   . Orthostatic hypotension 10/13/2020  . Chronic diarrhea 10/13/2020  . Colitis presumed infectious 10/13/2020  . Pressure injury of skin 10/13/2020  . ESRD on dialysis ([place]) 10/12/2020  . Unspecified atrial fibrillation ([place]) 10/12/2020  . Syncope 10/12/2020  . Small bowel obstruction ([place])   . Type 2 diabetes mellitus ([place]) 06/11/2018  . Peripheral neuropathy 06/11/2018  . Ankle syndesmosis disruption, right, initial encounter 06/11/2018  . AKI (acute  kidney injury) ([place])   . Dehydration   . Open right ankle fracture 06/08/2018  . Fracture, ankle 06/08/2018  . Open displaced fracture of medial malleolus of tibia, type III 06/08/2018    Past Surgical History:  Procedure Laterality Date  . BIOPSY  10/17/2020   Procedure: BIOPSY;  Surgeon: Rogene Houston, MD;  [facility];  Service: Endoscopy;;  ascending and descending  . BIOPSY  11/21/2020   Procedure: BIOPSY;  Surgeon: Rogene Houston, MD;  [facility];  Service: Endoscopy;;  . COLONOSCOPY WITH PROPOFOL N/A 10/17/2020   Procedure: COLONOSCOPY WITH PROPOFOL;  Surgeon: Rogene Houston, MD;  [facility];  Service: Endoscopy;  Laterality: N/A;  . COLONOSCOPY WITH PROPOFOL N/A 11/21/2020   Procedure: COLONOSCOPY WITH PROPOFOL;  Surgeon: Rogene Houston, MD;  [facility];  Service: Endoscopy;  Laterality: N/A;  . EXTERNAL FIXATION LEG Right 06/08/2018   Procedure: EXTERNAL FIXATION COMMINUTED FIBULA FRACTURE;  Surgeon: Netta Cedars, MD;  Location: [place];  Service: Orthopedics;  Laterality: Right;  . EXTERNAL FIXATION REMOVAL Right 06/11/2018   Procedure: REMOVAL EXTERNAL FIXATION LEG;  Surgeon: Shona Needles, MD;  Location: [place];  Service: Orthopedics;  Laterality: Right;  . I & D EXTREMITY Right 06/08/2018   Procedure: IRRIGATION AND DEBRIDEMENT OPEN TIBIA FRACTURE;  Surgeon: Netta Cedars, MD;  Location: [place];  Service: Orthopedics;  Laterality: Right;  . I & D EXTREMITY Right 06/11/2018   Procedure: IRRIGATION AND DEBRIDEMENT EXTREMITY;  Surgeon: Shona Needles, MD;  Location: [place];  Service: Orthopedics;  Laterality: Right;  . INSERTION OF DIALYSIS CATHETER Right 12/07/2020   Procedure: INSERTION OF DIALYSIS CATHETER;  Surgeon: Virl Cagey, MD;  Location: [facility];  Service: General;  Laterality: Right;  Exhange of catheter over wire versus new catheter  . INSERTION OF DIALYSIS CATHETER Right 12/10/2020   Procedure: INSERTION OF  DIALYSIS CATHETER;  Surgeon: Virl Cagey, MD;  Location: [facility];  Service: General;  Laterality: Right;  . NOSE SURGERY    . ORIF ANKLE FRACTURE Right 06/08/2018   Procedure: OPEN REDUCTION INTERNAL FIXATION (ORIF) MEDIAL MALLEOLUS;  Surgeon: Netta Cedars, MD;  Location: [place];  Service: Orthopedics;  Laterality: Right;  . ORIF ANKLE FRACTURE Right 06/11/2018   Procedure: OPEN REDUCTION INTERNAL FIXATION (ORIF) ANKLE FRACTURE;  Surgeon: Shona Needles, MD;  Location: [place];  Service: Orthopedics;  Laterality: Right;  . POLYPECTOMY  11/21/2020   Procedure: POLYPECTOMY;  Surgeon: Rogene Houston, MD;  [facility];  Service: Endoscopy;;  . TIBIA DEBRIDEMENT Right 06/08/2018       Family History  Problem Relation Age of Onset  . Hypertension Father     Social History   Tobacco Use  . Smoking status: Never Smoker  . Smokeless tobacco: Never Used  Vaping Use  . Vaping Use: Never used  Substance Use Topics  . Alcohol use: Yes    Comment: occasional  . Drug use: Never    Home Medications Prior to Admission medications   Medication Sig Start Date End Date Taking? Authorizing Provider  acetaminophen (TYLENOL) 325 MG tablet Take 2 tablets (650 mg total) by mouth every 6 (six) hours as needed for mild pain (or Fever >/= 101). 12/10/20   Johnson, Clanford L, MD  amiodarone (PACERONE) 200 MG tablet Take 200 mg by mouth daily. 10/01/20   [provider]  calcitRIOL (ROCALTROL) 0.5 MCG capsule Take 1 capsule (0.5 mcg total) by mouth daily. 12/11/20   Johnson, Clanford L, MD  collagenase (SANTYL) ointment Apply topically daily. 12/10/20   Johnson, Clanford L, MD  famotidine (PEPCID) 20 MG tablet Take 1 tablet (20 mg total) by mouth daily. 12/11/20   Johnson, Clanford L, MD  feeding supplement (ENSURE ENLIVE / ENSURE PLUS) LIQD Take 237 mLs by mouth 2 (two) times daily between meals. 12/10/20   Johnson, Clanford L, MD  finasteride (PROSCAR) 5 MG tablet Take 1 tablet (5 mg  total) by mouth daily. 12/11/20   Johnson, Clanford L, MD  fludrocortisone (FLORINEF) 0.1 MG tablet Take 2 tablets (0.2 mg total) by mouth daily. 12/11/20   Johnson, Clanford L, MD  metoprolol tartrate (LOPRESSOR) 25 MG tablet Take 0.5 tablets (12.5 mg total) by mouth at bedtime. 12/10/20   Johnson, Clanford L, MD  midodrine (PROAMATINE) 10 MG tablet Take 1 tablet (10 mg total) by mouth with breakfast, with lunch, and with evening meal. 11/01/20   Dessa Phi, DO  multivitamin (RENA-VIT) TABS tablet Take 1 tablet by mouth at bedtime. 12/10/20   Johnson, Clanford L, MD  ondansetron (ZOFRAN) 4 MG tablet Take 4 mg by mouth every 6 (six) hours as needed for nausea.  10/09/20   [provider]  pantoprazole (PROTONIX) 40 MG tablet Take 1 tablet (40 mg total) by mouth 2 (two) times daily before a meal. 12/10/20   Johnson, Clanford L, MD  psyllium (HYDROCIL/METAMUCIL) 95 % PACK Take 1 packet by mouth at bedtime. 12/10/20   Wynetta Emery, Clanford  L, MD  pyridostigmine (MESTINON) 60 MG tablet Take 1 tablet (60 mg total) by mouth daily. 12/11/20   Johnson, Clanford L, MD  simvastatin (ZOCOR) 20 MG tablet Take 20 mg by mouth every evening. 04/30/18   [provider]    Allergies    Patient has no known allergies.  Review of Systems   Review of Systems  Constitutional: Negative for chills and fever.  HENT: Negative for ear pain and sore throat.   Eyes: Negative for pain and visual disturbance.  Respiratory: Negative for cough and shortness of breath.   Cardiovascular: Negative for chest pain and palpitations.  Gastrointestinal: Negative for abdominal pain and vomiting.  Genitourinary: Negative for dysuria and hematuria.  Musculoskeletal: Negative for arthralgias and back pain.  Skin: Negative for color change and rash.  Neurological: Negative for seizures and syncope.  All other systems reviewed and are negative.   Physical Exam Updated Vital Signs BP (!) 81/38   Pulse 69   Temp 98.3 F  (36.8 C) (Oral)   Resp 18   Ht 6\' 2"  (1.88 m)   Wt 61.2 kg   SpO2 100%   BMI 17.33 kg/m   Physical Exam Vitals and nursing note reviewed.  Constitutional:      Appearance: He is well-developed and well-nourished.     Comments: Elderly but in no acute distress  HENT:     Head: Normocephalic and atraumatic.  Eyes:     Conjunctiva/sclera: Conjunctivae normal.  Cardiovascular:     Rate and Rhythm: Normal rate and regular rhythm.     Heart sounds: No murmur heard.   Pulmonary:     Effort: Pulmonary effort is normal. No respiratory distress.     Breath sounds: Normal breath sounds.  Abdominal:     Palpations: Abdomen is soft.     Tenderness: There is no abdominal tenderness.  Musculoskeletal:        General: No edema.     Cervical back: Neck supple.  Skin:    General: Skin is warm and dry.  Neurological:     General: No focal deficit present.     Mental Status: He is alert.  Psychiatric:        Mood and Affect: Mood and affect normal.     ED Results / Procedures / Treatments   Labs (all labs ordered are listed, but only abnormal results are displayed) Labs Reviewed  CBC WITH DIFFERENTIAL/PLATELET - Abnormal; Notable for the following components:      Result Value   RBC 2.95 (*)    Hemoglobin 8.8 (*)    HCT 30.0 (*)    MCV 101.7 (*)    MCHC 29.3 (*)    RDW 21.5 (*)    Platelets 120 (*)    All other components within normal limits  BASIC METABOLIC PANEL - Abnormal; Notable for the following components:   CO2 18 (*)    BUN 54 (*)    Creatinine, Ser 5.83 (*)    Calcium 7.1 (*)    GFR, Estimated 10 (*)    Anion gap 16 (*)    All other components within normal limits    EKG EKG Interpretation  Date/Time:  Tuesday December 18 2020 11:49:19 EST Ventricular Rate:  67 PR Interval:  148 QRS Duration: 96 QT Interval:  422 QTC Calculation: 445 R Axis:   51 Text Interpretation: Normal sinus rhythm Nonspecific T wave abnormality Abnormal ECG Confirmed by Madalyn Rob [phone]) on 12/18/2020 3:06:12 PM  Radiology No results found.  Procedures Procedures (including critical care time)  Medications Ordered in ED Medications  midodrine (PROAMATINE) tablet 10 mg (10 mg Oral Given 12/18/20 1719)    ED Course  I have reviewed the triage vital signs and the nursing notes.  Pertinent labs & imaging results that were available during my care of the patient were reviewed by me and considered in my medical decision making (see chart for details).    MDM Rules/Calculators/A&P                          70 year old male with a history of diabetes, kidney failure recently started on dialysis, A. fib, severe orthostatic hypotension. Discharged to [facility] but correct paperwork for prior authorization for transportation back and forth to dialysis had not been arranged. Patient sent to ER for evaluation after [facility] declined transport to dialysis today. On exam currently, patient is intermittent to be well-appearing in no acute distress. He denies any acute complaints. No significant electrolyte derangements on BMP. Not fluid overloaded on exam. Does not require emergent dialysis at present. Discussed case with Terri Piedra the renal social work as well as our transitions of care team. Brigitte Pulse reviewed with nephrologist on-call, okay to discharge today and get dialysis outpatient.  At this time, according to our case management/social work team, I believe we have completed the paperwork that [facility] requires to provide services needed to transport patient for his dialysis sessions. We will send patient back to [facility] this evening.  Noted intermittent low blood pressure while in ER. Reviewed chart from last admission, even on day of discharge patient had similar blood pressure ranges 80s to low 100s. Do not believe that patient has any acute process at present requiring repeat admission. Believe he is stable for discharge at this time.    After the discussed  management above, the patient was determined to be safe for discharge.  The patient was in agreement with this plan and all questions regarding their care were answered.  ED return precautions were discussed and the patient will return to the ED with any significant worsening of condition.    Final Clinical Impression(s) / ED Diagnoses Final diagnoses:  ESRD (end stage renal disease) ([place])    Rx / DC Orders ED Discharge Orders    None       Lucrezia Starch, MD 12/18/20 1744

## 2020-12-18 NOTE — Discharge Instructions (Addendum)
Ideally [name] should receive dialysis tomorrow, at very minimum Thursday. Our team has sent all necessary information for prior authorization to [facility]. Please coordinate with them regarding transportation. If patient has any increased level of confusion, difficulty breathing or other new concerning symptom, return to ER for reassessment.

## 2020-12-18 NOTE — ED Notes (Signed)
Called [facility] 6 in line

## 2020-12-18 NOTE — Progress Notes (Signed)
CSW spoke with [place] [facility] [place] and explained the work that Renal Navigator is doing to ensure that Pt will be receiving transportation to HD in te future and avoiding the ED.  CSW awaiting callback from [facility] for number for report to send Pt back to [facility].

## 2020-12-18 NOTE — ED Notes (Signed)
Pt given turkey sandwich and juice.  

## 2020-12-18 NOTE — ED Notes (Signed)
EDP aware of pts BP 

## 2020-12-18 NOTE — Progress Notes (Signed)
Renal Navigator received call from EDP/Dr. Roslynn Amble who inquired about patient who was reported brought to the ED by EMS when [facility] would not take him from SNF to outpatient HD today for treatment.  Navigator completed chart review and sees that patient was discharged to [place] last week from long admission at [facility]. HD was transferred from [facility] to [facility] for stretcher dialysis and SNF was supposed to arrange [facility] for transportation to/from HD. Navigator spoke with [facility] Social Worker/[place] and [facility] Supervisor/[place]ula and [place]. Navigator understands that [facility] was not arranged prior to patient discharging from the hospital, in the sense that prior authorization from Medicare for ongoing transportation 3 times a week was not arranged, so when [place] called [facility] to request patient's third trip to HD today, they requested documentation of medical necessity in order to get prior authorization that SNF could not provide and hung up (per [facility] staff). It appears SNF just called EMS to take patient to the ER because it was his regular HD day.  Navigator has gathered all necessary documents: MD order for HD transportation, documentation from chart review to support need for stretcher transport, and medical necessity form from [facility] CSW/EDP. Navigator greatly appreciates team work from Dr. Roslynn Amble, Chauncey Cruel. [place]/LCSW, and Dr. Joelyn Oms. All has been sent to [facility]. EDP and Nephrologist agree that patient has no medical need to remain in the hospital. Navigator adds that SNF and [facility] will need to remain in touch regarding prior authorization for ambulance transportation and patient will need to return to the ED if he is in medical need of HD prior to that time. This has been communicated to EDP and CSW. CSW reports that she will be in touch with SNF tonight regarding this information when she discusses his return.  Alphonzo Cruise, [place] Renal Navigator [phone]

## 2020-12-19 ENCOUNTER — Inpatient Hospital Stay (HOSPITAL_COMMUNITY)
Admission: EM | Admit: 2020-12-19 | Discharge: 2021-01-29 | DRG: 329 | Disposition: E | Payer: Medicare Other | Source: Skilled Nursing Facility | Attending: Internal Medicine | Admitting: Internal Medicine

## 2020-12-19 DIAGNOSIS — E1143 Type 2 diabetes mellitus with diabetic autonomic (poly)neuropathy: Secondary | ICD-10-CM | POA: Diagnosis present

## 2020-12-19 DIAGNOSIS — D62 Acute posthemorrhagic anemia: Secondary | ICD-10-CM | POA: Diagnosis present

## 2020-12-19 DIAGNOSIS — Z9289 Personal history of other medical treatment: Secondary | ICD-10-CM

## 2020-12-19 DIAGNOSIS — E8770 Fluid overload, unspecified: Secondary | ICD-10-CM | POA: Diagnosis present

## 2020-12-19 DIAGNOSIS — R001 Bradycardia, unspecified: Secondary | ICD-10-CM | POA: Diagnosis not present

## 2020-12-19 DIAGNOSIS — K633 Ulcer of intestine: Secondary | ICD-10-CM | POA: Diagnosis present

## 2020-12-19 DIAGNOSIS — R6521 Severe sepsis with septic shock: Secondary | ICD-10-CM | POA: Diagnosis not present

## 2020-12-19 DIAGNOSIS — I480 Paroxysmal atrial fibrillation: Secondary | ICD-10-CM | POA: Diagnosis present

## 2020-12-19 DIAGNOSIS — K5900 Constipation, unspecified: Secondary | ICD-10-CM

## 2020-12-19 DIAGNOSIS — G4700 Insomnia, unspecified: Secondary | ICD-10-CM | POA: Diagnosis not present

## 2020-12-19 DIAGNOSIS — N186 End stage renal disease: Secondary | ICD-10-CM | POA: Diagnosis present

## 2020-12-19 DIAGNOSIS — R112 Nausea with vomiting, unspecified: Secondary | ICD-10-CM

## 2020-12-19 DIAGNOSIS — J9690 Respiratory failure, unspecified, unspecified whether with hypoxia or hypercapnia: Secondary | ICD-10-CM

## 2020-12-19 DIAGNOSIS — R7401 Elevation of levels of liver transaminase levels: Secondary | ICD-10-CM

## 2020-12-19 DIAGNOSIS — Z0189 Encounter for other specified special examinations: Secondary | ICD-10-CM

## 2020-12-19 DIAGNOSIS — Z9115 Patient's noncompliance with renal dialysis: Secondary | ICD-10-CM

## 2020-12-19 DIAGNOSIS — D696 Thrombocytopenia, unspecified: Secondary | ICD-10-CM | POA: Diagnosis not present

## 2020-12-19 DIAGNOSIS — Z8249 Family history of ischemic heart disease and other diseases of the circulatory system: Secondary | ICD-10-CM

## 2020-12-19 DIAGNOSIS — E1165 Type 2 diabetes mellitus with hyperglycemia: Secondary | ICD-10-CM | POA: Diagnosis not present

## 2020-12-19 DIAGNOSIS — E1122 Type 2 diabetes mellitus with diabetic chronic kidney disease: Secondary | ICD-10-CM | POA: Diagnosis present

## 2020-12-19 DIAGNOSIS — J9602 Acute respiratory failure with hypercapnia: Secondary | ICD-10-CM | POA: Diagnosis not present

## 2020-12-19 DIAGNOSIS — G9340 Encephalopathy, unspecified: Secondary | ICD-10-CM | POA: Diagnosis not present

## 2020-12-19 DIAGNOSIS — T4275XA Adverse effect of unspecified antiepileptic and sedative-hypnotic drugs, initial encounter: Secondary | ICD-10-CM | POA: Diagnosis not present

## 2020-12-19 DIAGNOSIS — J982 Interstitial emphysema: Secondary | ICD-10-CM | POA: Diagnosis present

## 2020-12-19 DIAGNOSIS — E876 Hypokalemia: Secondary | ICD-10-CM | POA: Diagnosis present

## 2020-12-19 DIAGNOSIS — D631 Anemia in chronic kidney disease: Secondary | ICD-10-CM | POA: Diagnosis present

## 2020-12-19 DIAGNOSIS — D6489 Other specified anemias: Secondary | ICD-10-CM | POA: Diagnosis present

## 2020-12-19 DIAGNOSIS — Z79899 Other long term (current) drug therapy: Secondary | ICD-10-CM

## 2020-12-19 DIAGNOSIS — E872 Acidosis: Secondary | ICD-10-CM | POA: Diagnosis present

## 2020-12-19 DIAGNOSIS — K626 Ulcer of anus and rectum: Secondary | ICD-10-CM | POA: Diagnosis present

## 2020-12-19 DIAGNOSIS — Y92239 Unspecified place in hospital as the place of occurrence of the external cause: Secondary | ICD-10-CM | POA: Diagnosis not present

## 2020-12-19 DIAGNOSIS — K567 Ileus, unspecified: Secondary | ICD-10-CM | POA: Diagnosis not present

## 2020-12-19 DIAGNOSIS — K828 Other specified diseases of gallbladder: Secondary | ICD-10-CM | POA: Diagnosis present

## 2020-12-19 DIAGNOSIS — R571 Hypovolemic shock: Secondary | ICD-10-CM | POA: Diagnosis present

## 2020-12-19 DIAGNOSIS — M7989 Other specified soft tissue disorders: Secondary | ICD-10-CM | POA: Diagnosis not present

## 2020-12-19 DIAGNOSIS — K6389 Other specified diseases of intestine: Secondary | ICD-10-CM | POA: Diagnosis present

## 2020-12-19 DIAGNOSIS — E46 Unspecified protein-calorie malnutrition: Secondary | ICD-10-CM

## 2020-12-19 DIAGNOSIS — E1142 Type 2 diabetes mellitus with diabetic polyneuropathy: Secondary | ICD-10-CM | POA: Diagnosis present

## 2020-12-19 DIAGNOSIS — R54 Age-related physical debility: Secondary | ICD-10-CM | POA: Diagnosis present

## 2020-12-19 DIAGNOSIS — E43 Unspecified severe protein-calorie malnutrition: Secondary | ICD-10-CM | POA: Diagnosis present

## 2020-12-19 DIAGNOSIS — I951 Orthostatic hypotension: Secondary | ICD-10-CM | POA: Diagnosis present

## 2020-12-19 DIAGNOSIS — A4189 Other specified sepsis: Secondary | ICD-10-CM | POA: Diagnosis not present

## 2020-12-19 DIAGNOSIS — K551 Chronic vascular disorders of intestine: Secondary | ICD-10-CM | POA: Diagnosis not present

## 2020-12-19 DIAGNOSIS — Z515 Encounter for palliative care: Secondary | ICD-10-CM

## 2020-12-19 DIAGNOSIS — Z66 Do not resuscitate: Secondary | ICD-10-CM | POA: Diagnosis not present

## 2020-12-19 DIAGNOSIS — J9600 Acute respiratory failure, unspecified whether with hypoxia or hypercapnia: Secondary | ICD-10-CM

## 2020-12-19 DIAGNOSIS — Z992 Dependence on renal dialysis: Secondary | ICD-10-CM

## 2020-12-19 DIAGNOSIS — Z452 Encounter for adjustment and management of vascular access device: Secondary | ICD-10-CM

## 2020-12-19 DIAGNOSIS — K559 Vascular disorder of intestine, unspecified: Secondary | ICD-10-CM | POA: Diagnosis present

## 2020-12-19 DIAGNOSIS — R0602 Shortness of breath: Secondary | ICD-10-CM

## 2020-12-19 DIAGNOSIS — Z7401 Bed confinement status: Secondary | ICD-10-CM

## 2020-12-19 DIAGNOSIS — K668 Other specified disorders of peritoneum: Secondary | ICD-10-CM

## 2020-12-19 DIAGNOSIS — Z978 Presence of other specified devices: Secondary | ICD-10-CM

## 2020-12-19 DIAGNOSIS — L89154 Pressure ulcer of sacral region, stage 4: Secondary | ICD-10-CM | POA: Diagnosis present

## 2020-12-19 DIAGNOSIS — I120 Hypertensive chronic kidney disease with stage 5 chronic kidney disease or end stage renal disease: Secondary | ICD-10-CM | POA: Diagnosis present

## 2020-12-19 DIAGNOSIS — U071 COVID-19: Secondary | ICD-10-CM | POA: Diagnosis present

## 2020-12-19 DIAGNOSIS — J9601 Acute respiratory failure with hypoxia: Secondary | ICD-10-CM | POA: Diagnosis present

## 2020-12-19 DIAGNOSIS — J90 Pleural effusion, not elsewhere classified: Secondary | ICD-10-CM | POA: Diagnosis present

## 2020-12-19 DIAGNOSIS — K631 Perforation of intestine (nontraumatic): Principal | ICD-10-CM | POA: Diagnosis present

## 2020-12-19 DIAGNOSIS — E8809 Other disorders of plasma-protein metabolism, not elsewhere classified: Secondary | ICD-10-CM | POA: Diagnosis not present

## 2020-12-19 DIAGNOSIS — L0390 Cellulitis, unspecified: Secondary | ICD-10-CM | POA: Diagnosis not present

## 2020-12-19 DIAGNOSIS — R64 Cachexia: Secondary | ICD-10-CM | POA: Diagnosis present

## 2020-12-19 MED ORDER — IOHEXOL 9 MG/ML PO SOLN
ORAL | Status: AC
  Administered 2020-12-19: 500 mL via ORAL
  Filled 2020-12-19: qty 1000

## 2020-12-19 MED ORDER — ONDANSETRON HCL 4 MG/2ML IJ SOLN
4.0000 mg | Freq: Once | INTRAMUSCULAR | Status: AC
  Administered 2020-12-19: 4 mg via INTRAVENOUS
  Filled 2020-12-19: qty 2

## 2020-12-19 MED ORDER — SODIUM CHLORIDE 0.9 % IV BOLUS
500.0000 mL | Freq: Once | INTRAVENOUS | Status: AC
  Administered 2020-12-19: 500 mL via INTRAVENOUS

## 2020-12-19 MED ORDER — IOHEXOL 9 MG/ML PO SOLN
500.0000 mL | ORAL | Status: AC
  Administered 2020-12-20: 500 mL via ORAL

## 2020-12-19 NOTE — ED Provider Notes (Signed)
23:30: Assumed care of patient from [facility] PA-C @ change of shift pending labs, CT, and disposition.   Please see prior provider note for full H&P.  Briefly patient is a 70 year old male with a history of ESRD on dialysis, T2DM, and A. fib on Eliquis who presented to the emergency department from [facility] with complaints of abdominal discomfort and nausea/vomiting over the past couple of days.  He states he has vomited a total of about 10 times.  He states he has not had a bowel movement in 2 days and has not been passing gas.  He denies fever, hematemesis, or melena.  Physical Exam  BP 131/61   Pulse 73   Temp 98.1 F (36.7 C) (Axillary)   Resp 18   Ht 6\' 2"  (1.88 m)   Wt 61.2 kg   SpO2 100%   BMI 17.32 kg/m   Physical Exam Vitals reviewed.  Constitutional:      Appearance: He is ill-appearing.  HENT:     Head: Normocephalic and atraumatic.  Chest:     Comments: Dialysis catheter noted Abdominal:     General: There is distension.     Tenderness: There is abdominal tenderness. There is guarding.     Comments: Tympany to percussion  Genitourinary:    Comments: Foley catheter in place. Musculoskeletal:     Comments: Upper extremity edema noted to the hands and forearms.  Neurological:     Mental Status: He is alert.     Comments: Clear speech.    ED Course/Procedures   Results for orders placed or performed during the hospital encounter of 12/29/2020  CBC with Differential  Result Value Ref Range   WBC 15.4 (H) 4.0 - 10.5 K/uL   RBC 3.01 (L) 4.22 - 5.81 MIL/uL   Hemoglobin 8.8 (L) 13.0 - 17.0 g/dL   HCT 29.4 (L) 39.0 - 52.0 %   MCV 97.7 80.0 - 100.0 fL   MCH 29.2 26.0 - 34.0 pg   MCHC 29.9 (L) 30.0 - 36.0 g/dL   RDW 20.5 (H) 11.5 - 15.5 %   Platelets 93 (L) 150 - 400 K/uL   nRBC 0.0 0.0 - 0.2 %   Neutrophils Relative % 87 %   Neutro Abs 13.4 (H) 1.7 - 7.7 K/uL   Lymphocytes Relative 5 %   Lymphs Abs 0.8 0.7 - 4.0 K/uL   Monocytes Relative 7 %    Monocytes Absolute 1.1 (H) 0.1 - 1.0 K/uL   Eosinophils Relative 0 %   Eosinophils Absolute 0.0 0.0 - 0.5 K/uL   Basophils Relative 0 %   Basophils Absolute 0.0 0.0 - 0.1 K/uL   Immature Granulocytes 1 %   Abs Immature Granulocytes 0.14 (H) 0.00 - 0.07 K/uL   CT Abdomen Pelvis Wo Contrast  Result Date: 12/08/2020 CLINICAL DATA:  Abdominal pain EXAM: CT ABDOMEN AND PELVIS WITHOUT CONTRAST TECHNIQUE: Multidetector CT imaging of the abdomen and pelvis was performed following the standard protocol without IV contrast. COMPARISON:  November 07, 2020 and October 22, 2020 FINDINGS: Lower chest: The visualized heart size within normal limits. No pericardial fluid/thickening. No hiatal hernia. Small bilateral pleural effusions are seen. Hepatobiliary: A small amount of pneumobilia is seen in the inferior right liver lobe and surrounding the posterior gallbladder. A distended fluid and contrast filled gallbladder is seen, likely due to vicarious contrast excretion. There is small layering gallstones. Pancreas:  Unremarkable.  No surrounding inflammatory changes. Spleen: Normal in size. Although limited due to the lack  of intravenous contrast, normal in appearance. Adrenals/Urinary Tract: Both adrenal glands appear normal. Multiple bilateral renal calculi are again identified. The largest within the upper pole of the right kidney measuring 5 mm and the largest within the left kidney measuring 4 mm within the midpole. No hydronephrosis. The bladder is decompressed with a Foley catheter. Stomach/Bowel: Small amount of contrast reflux seen within the distal esophagus. There is mildly prominent contrast filled loops of jejunum seen within the left upper quadrant without a clear transition point the remainder of the small bowel is decompressed. There is been interval increased air and stool-filled dilation of the rectum and sigmoid colon to the level of the descending colon junction. At the level of the descending  colon/sigmoid colon junction there is focal wall thickening and a small amount of free air seen anteriorly, consistent with a probable transition point and perforation. There is air seen surrounding the stool at the level of the sigmoid rectal junction which could be concerning for pneumatosis. Vascular/Lymphatic: Scattered aortic atherosclerosis is noted. Reproductive: The prostate is unremarkable. Other: There is a moderate amount of free air seen under the hemidiaphragms and is seen surrounding the porta hepatis. Musculoskeletal: Bilateral sacral decubitus ulceration seen over the inferior coccyx with debris and foci of air. There is non loculated fluid in this area. Degenerative changes seen throughout the lumbar spine most notable of L2-L3. IMPRESSION: 1. There is a focal area of wall thickening with adjacent perforation of the distal descending colon at the junction of the sigmoid colon, likely the transition point. 2. There is moderately dilated sigmoid colon and rectum which is stool-filled and suggestion of possible pneumatosis. 3. Free air seen under the right hemidiaphragm and within the porta hepatis. 4. Small amount of air seen within the inferior right liver lobe which is concerning for portal venous gas given the patient's findings 5. Bilateral sacral decubitus ulceration with phlegmon and subcutaneous emphysema extending to the inferior coccyx. 6. These results were called by telephone at the time of interpretation on 12/16/2020 at 2:01 am to provider PA Sammy , who verbally acknowledged these results. Electronically Signed   By: Prudencio Pair M.D.   On: 12/14/2020 02:06   DG Chest Port 1 View  Result Date: 12/10/2020 CLINICAL DATA:  Status post exchange of a dialysis catheter. EXAM: PORTABLE CHEST 1 VIEW COMPARISON:  Single-view of the chest 12/07/2020. FINDINGS: New right IJ approach dialysis catheter is in place with its tip at the superior cavoatrial junction. No pneumothorax. Lungs are clear.  Heart size is normal. No acute or focal bony abnormality. IMPRESSION: Dialysis catheter tip projects at the superior cavoatrial junction. Negative for pneumothorax or acute disease. Electronically Signed   By: Inge Rise M.D.   On: 12/10/2020 12:09   DG Chest Port 1 View  Result Date: 12/07/2020 CLINICAL DATA:  Post RIGHT-side dialysis catheter exchange EXAM: PORTABLE CHEST 1 VIEW COMPARISON:  Portable exam 1240 hours compared to 1003 hours FINDINGS: RIGHT jugular dual-lumen central venous catheter with tip projecting over SVC. Normal heart size, mediastinal contours, and pulmonary vascularity. Atherosclerotic calcification aorta. Minimal chronic peribronchial thickening without pulmonary infiltrate, pleural effusion, or pneumothorax. Bones mildly demineralized. IMPRESSION: No pneumothorax following RIGHT jugular line placement. Minimal persistent bronchitic changes. Aortic Atherosclerosis (ICD10-I70.0). Electronically Signed   By: Lavonia Dana M.D.   On: 12/07/2020 12:55   DG Chest Port 1 View  Result Date: 12/06/2020 CLINICAL DATA:  End-stage renal disease. EXAM: PORTABLE CHEST 1 VIEW COMPARISON:  October 29, 2020. FINDINGS:  The heart size and mediastinal contours are within normal limits. Both lungs are clear. Right internal jugular dialysis catheter is unchanged in position with distal tip in expected position of cavoatrial junction. The visualized skeletal structures are unremarkable. IMPRESSION: No active disease. Electronically Signed   By: Marijo Conception M.D.   On: 12/06/2020 10:34   DG Abd Portable 1V  Result Date: 11/28/2020 CLINICAL DATA:  Abdominal distension abdominal pain EXAM: PORTABLE ABDOMEN - 1 VIEW COMPARISON:  November 07, 2020 CT assessment, abdominal plain film from November 15, 2020 FINDINGS: Decreased distension of bowel loops in the abdomen when compared to the study from November 15, 2020. Mild distension currently of scattered gas-filled loops of small bowel. Suggestion of  small amount of proximal rectal gas. No acute skeletal process on limited assessment. Signs of basilar atelectasis. IMPRESSION: Decreased distension of bowel loops in the abdomen when compared to the study from November 15, 2020. Mild distension currently of scattered gas-filled loops of small bowel may reflect mild ileus. Suggestion of small amount of proximal rectal gas. Electronically Signed   By: Zetta Bills M.D.   On: 11/28/2020 16:15   DG C-Arm 1-60 Min-No Report  Result Date: 12/10/2020 Fluoroscopy was utilized by the requesting physician.  No radiographic interpretation.   DG C-Arm 1-60 Min-No Report  Result Date: 12/07/2020 Fluoroscopy was utilized by the requesting physician.  No radiographic interpretation.    Clinical Course as of 12/12/2020 2325  Wed Dec 19, 2813  [id number] 70 year old male here with nausea vomiting.  He said he also had diarrhea which is stopped.  Abdominal soreness.  Does not know if he had any fever.  Recently started dialysis.  Soft blood pressures sound like they are baseline.  Getting labs and imaging. [MB]    Clinical Course User Index [MB] Hayden Rasmussen, MD    .Critical Care Performed by: Amaryllis Dyke, PA-C Authorized by: Amaryllis Dyke, PA-C     CRITICAL CARE Performed by: Kennith Maes   Total critical care time: 45 minutes  Critical care time was exclusive of separately billable procedures and treating other patients.  Critical care was necessary to treat or prevent imminent or life-threatening deterioration.  Critical care was time spent personally by me on the following activities: development of treatment plan with patient and/or surrogate as well as nursing, discussions with consultants, evaluation of patient's response to treatment, examination of patient, obtaining history from patient or surrogate, ordering and performing treatments and interventions, ordering and review of laboratory studies, ordering and  review of radiographic studies, pulse oximetry and re-evaluation of patient's condition.  EKG Interpretation  Date/Time:  Thursday December 20 2020 03:08:52 EST Ventricular Rate:  67 PR Interval:    QRS Duration: 112 QT Interval:  472 QTC Calculation: 499 R Axis:   26 Text Interpretation: Sinus rhythm Nonspecific T wave abnormality Borderline prolonged QT No significant change since 12/18/2020 Confirmed by Veryl Speak [phone]) on 12/23/2020 3:49:00 AM   MDM   02:03 CONSULT: Discussed w/ radiologist Dr. Westley Chandler regarding CT A/P results- Wall thickening with adjacent perforation of the distal descending colon @ sigmoid junction- likely transition point, stool filled sigmoid/rectum - possible pneumatosis, small amount of air seen within the inferior right liver lobe which is concerning for portal venous gas given the patient's findings.   Zosyn ordered. Will discuss w/ general surgery. On my assessment of patient at this time complaining of some discomfort- fentanyl ordered.   02:14: CONSULT: Discussed with general surgeon Dr. Bobbye Morton-  will see patient in the ED.  Appreciate consult.   CBC: leukocytosis w/ left shift & baseline anemia CMP: Hypocalcemia @ 4.9 & glucose of 66- discussed w/ attending- will give 1 g of calcium gluconate and 1/2 amp of D50. Will obtain EKG as well.   EKG without significant change compared to last tracing, QTc 499  Discussed w/ hospitalist, ICU, and general surgery regarding admission unit- ultimately general surgeon Dr. Bobbye Morton relayed she would discuss directly with critical care. Appreciate all for input in patient's care. Patient taken to the OR form ED     Amaryllis Dyke, PA-C 12/13/2020 [id number]    Veryl Speak, MD 12/03/2020 [phone]

## 2020-12-19 NOTE — ED Triage Notes (Signed)
Pt bib gems from [place] c/o n/v onset today. Pt is T Th S dialysis with last session being on Saturday. Pt seen and treated here earlier today for same reason.   BP: 74/52 CBG: 98

## 2020-12-19 NOTE — ED Notes (Signed)
PTAR at bedside 

## 2020-12-19 NOTE — ED Provider Notes (Signed)
[facility] EMERGENCY DEPARTMENT Provider Note   CSN: [id number] Arrival date & time: 12/22/2020  2134     History Chief Complaint  Patient presents with  . Vomiting    [name] is a 70 y.o. male with past medical history of type 2 diabetes, end-stage renal disease on hemodialysis, severe orthostatic hypotension that presents to the emergency department today from [facility] for nausea vomiting. Patient states that he has had nausea vomiting for the past 3 days. Per chart review patient was seen in the emergency department yesterday for a vascular access problem, states that he told him about his nausea vomiting, however did not receive intervention for this. Patient states that he is spitting up clear spit, no actual vomit. No bilious or hematemesis. No hemoptysis. Denies any cough. Patient states that he also has abdominal tenderness, feels as if it sore. His generalized. Denies any chest pain or shortness of breath. Denies any fevers or chills. Denies any urinary symptoms. Did also admit to 3 days of diarrhea, no hematochezia. States that he has not had an appetite. Patient states that he did go to his dialysis on Saturday, states that he has been missing a couple sessions, he is unsure hemisections he is missed. HPI     Past Medical History:  Diagnosis Date  . Diabetes mellitus, type 2 ([place])   . ESRD on hemodialysis ([place])   . GI bleed   . Orthostatic hypotension   . PAF (paroxysmal atrial fibrillation) [facility])     Patient Active Problem List   Diagnosis Date Noted  . Problem with dialysis access ([place])   . Complication of vascular dialysis catheter   . Goals of care, counseling/discussion 11/29/2020  . Abdominal distension   . Colitis   . ESRD on hemodialysis ([place])   . Syncope due to orthostatic hypotension 11/02/2020  . Rectal bleeding   . Acute on chronic anemia   . Diarrhea   . Orthostatic hypotension 10/13/2020  . Chronic diarrhea 10/13/2020  .  Colitis presumed infectious 10/13/2020  . Pressure injury of skin 10/13/2020  . ESRD on dialysis ([place]) 10/12/2020  . Unspecified atrial fibrillation ([place]) 10/12/2020  . Syncope 10/12/2020  . Small bowel obstruction ([place])   . Type 2 diabetes mellitus ([place]) 06/11/2018  . Peripheral neuropathy 06/11/2018  . Ankle syndesmosis disruption, right, initial encounter 06/11/2018  . AKI (acute kidney injury) ([place])   . Dehydration   . Open right ankle fracture 06/08/2018  . Fracture, ankle 06/08/2018  . Open displaced fracture of medial malleolus of tibia, type III 06/08/2018    Past Surgical History:  Procedure Laterality Date  . BIOPSY  10/17/2020   Procedure: BIOPSY;  Surgeon: Rogene Houston, MD;  [facility];  Service: Endoscopy;;  ascending and descending  . BIOPSY  11/21/2020   Procedure: BIOPSY;  Surgeon: Rogene Houston, MD;  [facility];  Service: Endoscopy;;  . COLONOSCOPY WITH PROPOFOL N/A 10/17/2020   Procedure: COLONOSCOPY WITH PROPOFOL;  Surgeon: Rogene Houston, MD;  [facility];  Service: Endoscopy;  Laterality: N/A;  . COLONOSCOPY WITH PROPOFOL N/A 11/21/2020   Procedure: COLONOSCOPY WITH PROPOFOL;  Surgeon: Rogene Houston, MD;  [facility];  Service: Endoscopy;  Laterality: N/A;  . EXTERNAL FIXATION LEG Right 06/08/2018   Procedure: EXTERNAL FIXATION COMMINUTED FIBULA FRACTURE;  Surgeon: Netta Cedars, MD;  Location: [place];  Service: Orthopedics;  Laterality: Right;  . EXTERNAL FIXATION REMOVAL Right 06/11/2018  Procedure: REMOVAL EXTERNAL FIXATION LEG;  Surgeon: Shona Needles, MD;  Location: [place];  Service: Orthopedics;  Laterality: Right;  . I & D EXTREMITY Right 06/08/2018   Procedure: IRRIGATION AND DEBRIDEMENT OPEN TIBIA FRACTURE;  Surgeon: Netta Cedars, MD;  Location: [place];  Service: Orthopedics;  Laterality: Right;  . I & D EXTREMITY Right 06/11/2018   Procedure: IRRIGATION AND DEBRIDEMENT EXTREMITY;  Surgeon: Shona Needles, MD;  Location: [place];  Service: Orthopedics;  Laterality: Right;  . INSERTION OF DIALYSIS CATHETER Right 12/07/2020   Procedure: INSERTION OF DIALYSIS CATHETER;  Surgeon: Virl Cagey, MD;  Location: [facility];  Service: General;  Laterality: Right;  Exhange of catheter over wire versus new catheter  . INSERTION OF DIALYSIS CATHETER Right 12/10/2020   Procedure: INSERTION OF DIALYSIS CATHETER;  Surgeon: Virl Cagey, MD;  Location: [facility];  Service: General;  Laterality: Right;  . NOSE SURGERY    . ORIF ANKLE FRACTURE Right 06/08/2018   Procedure: OPEN REDUCTION INTERNAL FIXATION (ORIF) MEDIAL MALLEOLUS;  Surgeon: Netta Cedars, MD;  Location: [place];  Service: Orthopedics;  Laterality: Right;  . ORIF ANKLE FRACTURE Right 06/11/2018   Procedure: OPEN REDUCTION INTERNAL FIXATION (ORIF) ANKLE FRACTURE;  Surgeon: Shona Needles, MD;  Location: [place];  Service: Orthopedics;  Laterality: Right;  . POLYPECTOMY  11/21/2020   Procedure: POLYPECTOMY;  Surgeon: Rogene Houston, MD;  [facility];  Service: Endoscopy;;  . TIBIA DEBRIDEMENT Right 06/08/2018       Family History  Problem Relation Age of Onset  . Hypertension Father     Social History   Tobacco Use  . Smoking status: Never Smoker  . Smokeless tobacco: Never Used  Vaping Use  . Vaping Use: Never used  Substance Use Topics  . Alcohol use: Yes    Comment: occasional  . Drug use: Never    Home Medications Prior to Admission medications   Medication Sig Start Date End Date Taking? Authorizing Provider  acetaminophen (TYLENOL) 325 MG tablet Take 2 tablets (650 mg total) by mouth every 6 (six) hours as needed for mild pain (or Fever >/= 101). 12/10/20   Johnson, Clanford L, MD  amiodarone (PACERONE) 200 MG tablet Take 200 mg by mouth daily. 10/01/20   [provider]  calcitRIOL (ROCALTROL) 0.5 MCG capsule Take 1 capsule (0.5 mcg total) by mouth daily. 12/11/20   Johnson, Clanford L, MD  collagenase  (SANTYL) ointment Apply topically daily. 12/10/20   Johnson, Clanford L, MD  famotidine (PEPCID) 20 MG tablet Take 1 tablet (20 mg total) by mouth daily. 12/11/20   Johnson, Clanford L, MD  feeding supplement (ENSURE ENLIVE / ENSURE PLUS) LIQD Take 237 mLs by mouth 2 (two) times daily between meals. 12/10/20   Johnson, Clanford L, MD  finasteride (PROSCAR) 5 MG tablet Take 1 tablet (5 mg total) by mouth daily. 12/11/20   Johnson, Clanford L, MD  fludrocortisone (FLORINEF) 0.1 MG tablet Take 2 tablets (0.2 mg total) by mouth daily. 12/11/20   Johnson, Clanford L, MD  metoprolol tartrate (LOPRESSOR) 25 MG tablet Take 0.5 tablets (12.5 mg total) by mouth at bedtime. 12/10/20   Johnson, Clanford L, MD  midodrine (PROAMATINE) 10 MG tablet Take 1 tablet (10 mg total) by mouth with breakfast, with lunch, and with evening meal. 11/01/20   Dessa Phi, DO  multivitamin (RENA-VIT) TABS tablet Take 1 tablet by mouth at bedtime. 12/10/20   Murlean Iba, MD  ondansetron [facility])  4 MG tablet Take 4 mg by mouth every 6 (six) hours as needed for nausea.  10/09/20   [provider]  pantoprazole (PROTONIX) 40 MG tablet Take 1 tablet (40 mg total) by mouth 2 (two) times daily before a meal. 12/10/20   Johnson, Clanford L, MD  psyllium (HYDROCIL/METAMUCIL) 95 % PACK Take 1 packet by mouth at bedtime. 12/10/20   Johnson, Clanford L, MD  pyridostigmine (MESTINON) 60 MG tablet Take 1 tablet (60 mg total) by mouth daily. 12/11/20   Johnson, Clanford L, MD  simvastatin (ZOCOR) 20 MG tablet Take 20 mg by mouth every evening. 04/30/18   [provider]    Allergies    Patient has no known allergies.  Review of Systems   Review of Systems  Constitutional: Negative for chills, diaphoresis, fatigue and fever.  HENT: Negative for congestion, sore throat and trouble swallowing.   Eyes: Negative for pain and visual disturbance.  Respiratory: Negative for cough, shortness of breath and wheezing.    Cardiovascular: Negative for chest pain, palpitations and leg swelling.  Gastrointestinal: Positive for abdominal pain, nausea and vomiting. Negative for abdominal distention and diarrhea.  Genitourinary: Negative for difficulty urinating.  Musculoskeletal: Negative for back pain, neck pain and neck stiffness.  Skin: Negative for pallor.  Neurological: Negative for dizziness, speech difficulty, weakness and headaches.  Psychiatric/Behavioral: Negative for confusion.    Physical Exam Updated Vital Signs BP 131/61   Pulse 73   Temp 98.1 F (36.7 C) (Axillary)   Resp 18   Ht 6\' 2"  (1.88 m)   Wt 61.2 kg   SpO2 100%   BMI 17.32 kg/m   Physical Exam Constitutional:      General: He is not in acute distress.    Appearance: Normal appearance. He is ill-appearing. He is not toxic-appearing or diaphoretic.     Comments: Chronically ill-appearing male, no acute distress. Does have emesis bag near him, is empty. Is constantly retching.  HENT:     Mouth/Throat:     Mouth: Mucous membranes are moist.     Pharynx: Oropharynx is clear.  Eyes:     General: No scleral icterus.    Extraocular Movements: Extraocular movements intact.     Pupils: Pupils are equal, round, and reactive to light.  Cardiovascular:     Rate and Rhythm: Normal rate and regular rhythm.     Pulses: Normal pulses.     Heart sounds: Normal heart sounds.  Pulmonary:     Effort: Pulmonary effort is normal. No respiratory distress.     Breath sounds: Normal breath sounds. No stridor. No wheezing, rhonchi or rales.  Chest:     Chest wall: No tenderness.  Abdominal:     General: Abdomen is flat. There is no distension.     Palpations: Abdomen is soft.     Tenderness: There is abdominal tenderness. There is no guarding or rebound.     Comments: Generalized abdominal tenderness, no guarding.  Musculoskeletal:        General: No swelling or tenderness. Normal range of motion.     Cervical back: Normal range of motion  and neck supple. No rigidity.     Right lower leg: No edema.     Left lower leg: No edema.  Skin:    General: Skin is warm and dry.     Capillary Refill: Capillary refill takes less than 2 seconds.     Coloration: Skin is jaundiced. Skin is not pale.  Neurological:  General: No focal deficit present.     Mental Status: He is alert and oriented to person, place, and time.  Psychiatric:        Mood and Affect: Mood normal.        Behavior: Behavior normal.     ED Results / Procedures / Treatments   Labs (all labs ordered are listed, but only abnormal results are displayed) Labs Reviewed  CBC WITH DIFFERENTIAL/PLATELET  COMPREHENSIVE METABOLIC PANEL  LIPASE, BLOOD  URINALYSIS, ROUTINE W REFLEX MICROSCOPIC    EKG None  Radiology No results found.  Procedures Procedures (including critical care time)  Medications Ordered in ED Medications  ondansetron (ZOFRAN) injection 4 mg (has no administration in time range)  sodium chloride 0.9 % bolus 500 mL (has no administration in time range)  iohexol (OMNIPAQUE) 9 MG/ML oral solution 500 mL (has no administration in time range)  iohexol (OMNIPAQUE) 9 MG/ML oral solution (has no administration in time range)    ED Course  I have reviewed the triage vital signs and the nursing notes.  Pertinent labs & imaging results that were available during my care of the patient were reviewed by me and considered in my medical decision making (see chart for details).  Clinical Course as of 12/12/2020 2311  Wed Dec 20, 3447  4049 70 year old male here with nausea vomiting.  He said he also had diarrhea which is stopped.  Abdominal soreness.  Does not know if he had any fever.  Recently started dialysis.  Soft blood pressures sound like they are baseline.  Getting labs and imaging. [MB]    Clinical Course User Index [MB] Hayden Rasmussen, MD   MDM Rules/Calculators/A&P                          GARELD OBRECHT is a 70 y.o. male with past  medical history of type 2 diabetes, end-stage renal disease on hemodialysis, severe orthostatic hypotension that presents to the emergency department today from [facility] for nausea vomiting. No focal abdominal tenderness, patient is not having any peritoneal signs, however patient states that he is sore from vomiting and retching. No vomiting in emesis bag, however patient is constantly retching. Will give IV Zofran small fluid bolus and obtain basic labs and CT imaging and reevaluate.  I think that this could be gastroenteritis, however will obtain CT imaging to rule out other disease states.  Pt care was handed off to S. Petrucelli PA-C at handoff.  Complete history and physical and current plan have been communicated.  Please refer to their note for the remainder of ED care and ultimate disposition. Awaiting labs and imaging.   I discussed this case with my attending physician who cosigned this note including patient's presenting symptoms, physical exam, and planned diagnostics and interventions. Attending physician stated agreement with plan or made changes to plan which were implemented.   Attending physician assessed patient at bedside.   Final Clinical Impression(s) / ED Diagnoses Final diagnoses:  Nausea vomiting and diarrhea    Rx / DC Orders ED Discharge Orders    None       Alfredia Client, PA-C 12/05/2020 2327    Hayden Rasmussen, MD 12/22/2020 1221

## 2020-12-20 ENCOUNTER — Emergency Department (HOSPITAL_COMMUNITY): Payer: Medicare Other | Admitting: Registered Nurse

## 2020-12-20 ENCOUNTER — Encounter (HOSPITAL_COMMUNITY): Admission: EM | Disposition: E | Payer: Self-pay | Source: Skilled Nursing Facility | Attending: Internal Medicine

## 2020-12-20 ENCOUNTER — Inpatient Hospital Stay (HOSPITAL_COMMUNITY): Payer: Medicare Other

## 2020-12-20 ENCOUNTER — Emergency Department (HOSPITAL_COMMUNITY): Payer: Medicare Other

## 2020-12-20 DIAGNOSIS — D696 Thrombocytopenia, unspecified: Secondary | ICD-10-CM | POA: Diagnosis not present

## 2020-12-20 DIAGNOSIS — K668 Other specified disorders of peritoneum: Secondary | ICD-10-CM

## 2020-12-20 DIAGNOSIS — K828 Other specified diseases of gallbladder: Secondary | ICD-10-CM | POA: Diagnosis not present

## 2020-12-20 DIAGNOSIS — R52 Pain, unspecified: Secondary | ICD-10-CM | POA: Diagnosis not present

## 2020-12-20 DIAGNOSIS — K631 Perforation of intestine (nontraumatic): Secondary | ICD-10-CM | POA: Diagnosis present

## 2020-12-20 DIAGNOSIS — J9602 Acute respiratory failure with hypercapnia: Secondary | ICD-10-CM | POA: Diagnosis not present

## 2020-12-20 DIAGNOSIS — N189 Chronic kidney disease, unspecified: Secondary | ICD-10-CM | POA: Diagnosis not present

## 2020-12-20 DIAGNOSIS — E1110 Type 2 diabetes mellitus with ketoacidosis without coma: Secondary | ICD-10-CM | POA: Diagnosis not present

## 2020-12-20 DIAGNOSIS — R6521 Severe sepsis with septic shock: Secondary | ICD-10-CM | POA: Diagnosis not present

## 2020-12-20 DIAGNOSIS — K567 Ileus, unspecified: Secondary | ICD-10-CM | POA: Diagnosis not present

## 2020-12-20 DIAGNOSIS — L0390 Cellulitis, unspecified: Secondary | ICD-10-CM | POA: Diagnosis not present

## 2020-12-20 DIAGNOSIS — E43 Unspecified severe protein-calorie malnutrition: Secondary | ICD-10-CM | POA: Diagnosis not present

## 2020-12-20 DIAGNOSIS — J984 Other disorders of lung: Secondary | ICD-10-CM | POA: Diagnosis not present

## 2020-12-20 DIAGNOSIS — K6389 Other specified diseases of intestine: Secondary | ICD-10-CM | POA: Diagnosis not present

## 2020-12-20 DIAGNOSIS — R109 Unspecified abdominal pain: Secondary | ICD-10-CM | POA: Diagnosis not present

## 2020-12-20 DIAGNOSIS — I480 Paroxysmal atrial fibrillation: Secondary | ICD-10-CM | POA: Diagnosis not present

## 2020-12-20 DIAGNOSIS — A419 Sepsis, unspecified organism: Secondary | ICD-10-CM | POA: Diagnosis not present

## 2020-12-20 DIAGNOSIS — K5180 Other ulcerative colitis without complications: Secondary | ICD-10-CM | POA: Diagnosis not present

## 2020-12-20 DIAGNOSIS — Z992 Dependence on renal dialysis: Secondary | ICD-10-CM | POA: Diagnosis not present

## 2020-12-20 DIAGNOSIS — R188 Other ascites: Secondary | ICD-10-CM | POA: Diagnosis not present

## 2020-12-20 DIAGNOSIS — Z452 Encounter for adjustment and management of vascular access device: Secondary | ICD-10-CM | POA: Diagnosis not present

## 2020-12-20 DIAGNOSIS — R410 Disorientation, unspecified: Secondary | ICD-10-CM | POA: Diagnosis not present

## 2020-12-20 DIAGNOSIS — L89899 Pressure ulcer of other site, unspecified stage: Secondary | ICD-10-CM | POA: Diagnosis not present

## 2020-12-20 DIAGNOSIS — N186 End stage renal disease: Secondary | ICD-10-CM | POA: Diagnosis not present

## 2020-12-20 DIAGNOSIS — A4189 Other specified sepsis: Secondary | ICD-10-CM | POA: Diagnosis not present

## 2020-12-20 DIAGNOSIS — G9340 Encephalopathy, unspecified: Secondary | ICD-10-CM | POA: Diagnosis not present

## 2020-12-20 DIAGNOSIS — R64 Cachexia: Secondary | ICD-10-CM | POA: Diagnosis present

## 2020-12-20 DIAGNOSIS — R0600 Dyspnea, unspecified: Secondary | ICD-10-CM | POA: Diagnosis not present

## 2020-12-20 DIAGNOSIS — N200 Calculus of kidney: Secondary | ICD-10-CM | POA: Diagnosis not present

## 2020-12-20 DIAGNOSIS — J9601 Acute respiratory failure with hypoxia: Secondary | ICD-10-CM | POA: Diagnosis not present

## 2020-12-20 DIAGNOSIS — K626 Ulcer of anus and rectum: Secondary | ICD-10-CM | POA: Diagnosis present

## 2020-12-20 DIAGNOSIS — R918 Other nonspecific abnormal finding of lung field: Secondary | ICD-10-CM | POA: Diagnosis not present

## 2020-12-20 DIAGNOSIS — U071 COVID-19: Secondary | ICD-10-CM | POA: Diagnosis not present

## 2020-12-20 DIAGNOSIS — R579 Shock, unspecified: Secondary | ICD-10-CM | POA: Diagnosis not present

## 2020-12-20 DIAGNOSIS — Z7189 Other specified counseling: Secondary | ICD-10-CM | POA: Diagnosis not present

## 2020-12-20 DIAGNOSIS — I951 Orthostatic hypotension: Secondary | ICD-10-CM | POA: Diagnosis not present

## 2020-12-20 DIAGNOSIS — K5900 Constipation, unspecified: Secondary | ICD-10-CM | POA: Diagnosis not present

## 2020-12-20 DIAGNOSIS — E1122 Type 2 diabetes mellitus with diabetic chronic kidney disease: Secondary | ICD-10-CM | POA: Diagnosis present

## 2020-12-20 DIAGNOSIS — K559 Vascular disorder of intestine, unspecified: Secondary | ICD-10-CM | POA: Diagnosis present

## 2020-12-20 DIAGNOSIS — I120 Hypertensive chronic kidney disease with stage 5 chronic kidney disease or end stage renal disease: Secondary | ICD-10-CM | POA: Diagnosis present

## 2020-12-20 DIAGNOSIS — J90 Pleural effusion, not elsewhere classified: Secondary | ICD-10-CM | POA: Diagnosis not present

## 2020-12-20 DIAGNOSIS — N179 Acute kidney failure, unspecified: Secondary | ICD-10-CM | POA: Diagnosis not present

## 2020-12-20 DIAGNOSIS — K551 Chronic vascular disorders of intestine: Secondary | ICD-10-CM | POA: Diagnosis not present

## 2020-12-20 DIAGNOSIS — J9690 Respiratory failure, unspecified, unspecified whether with hypoxia or hypercapnia: Secondary | ICD-10-CM | POA: Diagnosis not present

## 2020-12-20 DIAGNOSIS — K922 Gastrointestinal hemorrhage, unspecified: Secondary | ICD-10-CM | POA: Diagnosis not present

## 2020-12-20 DIAGNOSIS — Z66 Do not resuscitate: Secondary | ICD-10-CM | POA: Diagnosis not present

## 2020-12-20 DIAGNOSIS — K529 Noninfective gastroenteritis and colitis, unspecified: Secondary | ICD-10-CM | POA: Diagnosis not present

## 2020-12-20 DIAGNOSIS — K633 Ulcer of intestine: Secondary | ICD-10-CM | POA: Diagnosis present

## 2020-12-20 DIAGNOSIS — Z515 Encounter for palliative care: Secondary | ICD-10-CM | POA: Diagnosis not present

## 2020-12-20 DIAGNOSIS — J9600 Acute respiratory failure, unspecified whether with hypoxia or hypercapnia: Secondary | ICD-10-CM | POA: Diagnosis not present

## 2020-12-20 DIAGNOSIS — E872 Acidosis: Secondary | ICD-10-CM | POA: Diagnosis present

## 2020-12-20 DIAGNOSIS — D62 Acute posthemorrhagic anemia: Secondary | ICD-10-CM | POA: Diagnosis present

## 2020-12-20 DIAGNOSIS — Y92239 Unspecified place in hospital as the place of occurrence of the external cause: Secondary | ICD-10-CM | POA: Diagnosis not present

## 2020-12-20 DIAGNOSIS — R112 Nausea with vomiting, unspecified: Secondary | ICD-10-CM | POA: Diagnosis not present

## 2020-12-20 DIAGNOSIS — L89159 Pressure ulcer of sacral region, unspecified stage: Secondary | ICD-10-CM | POA: Diagnosis not present

## 2020-12-20 DIAGNOSIS — R7401 Elevation of levels of liver transaminase levels: Secondary | ICD-10-CM | POA: Diagnosis not present

## 2020-12-20 DIAGNOSIS — K760 Fatty (change of) liver, not elsewhere classified: Secondary | ICD-10-CM | POA: Diagnosis not present

## 2020-12-20 DIAGNOSIS — Z4682 Encounter for fitting and adjustment of non-vascular catheter: Secondary | ICD-10-CM | POA: Diagnosis not present

## 2020-12-20 DIAGNOSIS — L89154 Pressure ulcer of sacral region, stage 4: Secondary | ICD-10-CM | POA: Diagnosis not present

## 2020-12-20 HISTORY — PX: LAPAROTOMY: SHX154

## 2020-12-20 LAB — CBC WITH DIFFERENTIAL/PLATELET
Abs Immature Granulocytes: 0.14 10*3/uL — ABNORMAL HIGH (ref 0.00–0.07)
Basophils Absolute: 0 10*3/uL (ref 0.0–0.1)
Basophils Relative: 0 %
Eosinophils Absolute: 0 10*3/uL (ref 0.0–0.5)
Eosinophils Relative: 0 %
HCT: 29.4 % — ABNORMAL LOW (ref 39.0–52.0)
Hemoglobin: 8.8 g/dL — ABNORMAL LOW (ref 13.0–17.0)
Immature Granulocytes: 1 %
Lymphocytes Relative: 5 %
Lymphs Abs: 0.8 10*3/uL (ref 0.7–4.0)
MCH: 29.2 pg (ref 26.0–34.0)
MCHC: 29.9 g/dL — ABNORMAL LOW (ref 30.0–36.0)
MCV: 97.7 fL (ref 80.0–100.0)
Monocytes Absolute: 1.1 10*3/uL — ABNORMAL HIGH (ref 0.1–1.0)
Monocytes Relative: 7 %
Neutro Abs: 13.4 10*3/uL — ABNORMAL HIGH (ref 1.7–7.7)
Neutrophils Relative %: 87 %
Platelets: 93 10*3/uL — ABNORMAL LOW (ref 150–400)
RBC: 3.01 MIL/uL — ABNORMAL LOW (ref 4.22–5.81)
RDW: 20.5 % — ABNORMAL HIGH (ref 11.5–15.5)
WBC: 15.4 10*3/uL — ABNORMAL HIGH (ref 4.0–10.5)
nRBC: 0 % (ref 0.0–0.2)

## 2020-12-20 LAB — GLUCOSE, CAPILLARY
Glucose-Capillary: 90 mg/dL (ref 70–99)
Glucose-Capillary: 109 mg/dL — ABNORMAL HIGH (ref 70–99)
Glucose-Capillary: 133 mg/dL — ABNORMAL HIGH (ref 70–99)
Glucose-Capillary: 108 mg/dL — ABNORMAL HIGH (ref 70–99)
Glucose-Capillary: 104 mg/dL — ABNORMAL HIGH (ref 70–99)

## 2020-12-20 LAB — BLOOD GAS, ARTERIAL
Acid-base deficit: 12.6 mmol/L — ABNORMAL HIGH (ref 0.0–2.0)
Bicarbonate: 13.3 mmol/L — ABNORMAL LOW (ref 20.0–28.0)
FIO2: 40
O2 Saturation: 97 %
Patient temperature: 36.1
pCO2 arterial: 30.1 mmHg — ABNORMAL LOW (ref 32.0–48.0)
pH, Arterial: 7.261 — ABNORMAL LOW (ref 7.350–7.450)
pO2, Arterial: 149 mmHg — ABNORMAL HIGH (ref 83.0–108.0)

## 2020-12-20 LAB — COMPREHENSIVE METABOLIC PANEL
ALT: 7 U/L (ref 0–44)
AST: 28 U/L (ref 15–41)
Albumin: 1.3 g/dL — ABNORMAL LOW (ref 3.5–5.0)
Alkaline Phosphatase: 68 U/L (ref 38–126)
Anion gap: 15 (ref 5–15)
BUN: 61 mg/dL — ABNORMAL HIGH (ref 8–23)
CO2: 11 mmol/L — ABNORMAL LOW (ref 22–32)
Calcium: 4.9 mg/dL — CL (ref 8.9–10.3)
Chloride: 113 mmol/L — ABNORMAL HIGH (ref 98–111)
Creatinine, Ser: 5.27 mg/dL — ABNORMAL HIGH (ref 0.61–1.24)
GFR, Estimated: 11 mL/min — ABNORMAL LOW (ref >60)
Glucose, Bld: 66 mg/dL — ABNORMAL LOW (ref 70–99)
Potassium: 3.4 mmol/L — ABNORMAL LOW (ref 3.5–5.1)
Sodium: 139 mmol/L (ref 135–145)
Total Bilirubin: 0.9 mg/dL (ref 0.3–1.2)
Total Protein: 3.6 g/dL — ABNORMAL LOW (ref 6.5–8.1)

## 2020-12-20 LAB — RESP PANEL BY RT-PCR (FLU A&B, COVID) ARPGX2
Influenza A by PCR: NEGATIVE
Influenza B by PCR: NEGATIVE
SARS Coronavirus 2 by RT PCR: POSITIVE — AB

## 2020-12-20 LAB — LACTIC ACID, PLASMA: Lactic Acid, Venous: 1.8 mmol/L (ref 0.5–1.9)

## 2020-12-20 LAB — LIPASE, BLOOD: Lipase: 14 U/L (ref 11–51)

## 2020-12-20 SURGERY — LAPAROTOMY, EXPLORATORY
Anesthesia: General | Site: Abdomen

## 2020-12-20 MED ORDER — DEXAMETHASONE SODIUM PHOSPHATE 10 MG/ML IJ SOLN
INTRAMUSCULAR | Status: DC | PRN
  Administered 2020-12-20: 5 mg via INTRAVENOUS

## 2020-12-20 MED ORDER — CALCIUM GLUCONATE-NACL 1-0.675 GM/50ML-% IV SOLN
1.0000 g | Freq: Once | INTRAVENOUS | Status: AC
  Administered 2020-12-20: 1000 mg via INTRAVENOUS
  Filled 2020-12-20: qty 50

## 2020-12-20 MED ORDER — FENTANYL CITRATE (PF) 100 MCG/2ML IJ SOLN
INTRAMUSCULAR | Status: AC
  Filled 2020-12-20: qty 2

## 2020-12-20 MED ORDER — DOCUSATE SODIUM 100 MG PO CAPS: 100.0000 mg | ORAL_CAPSULE | Freq: Two times a day (BID) | ORAL | Status: DC | PRN

## 2020-12-20 MED ORDER — FENTANYL CITRATE (PF) 250 MCG/5ML IJ SOLN
INTRAMUSCULAR | Status: AC
  Filled 2020-12-20: qty 5

## 2020-12-20 MED ORDER — SUCCINYLCHOLINE CHLORIDE 200 MG/10ML IV SOSY
PREFILLED_SYRINGE | INTRAVENOUS | Status: DC | PRN
  Administered 2020-12-20: 100 mg via INTRAVENOUS

## 2020-12-20 MED ORDER — SUGAMMADEX SODIUM 200 MG/2ML IV SOLN
INTRAVENOUS | Status: DC | PRN
  Administered 2020-12-20: 200 mg via INTRAVENOUS

## 2020-12-20 MED ORDER — MIDAZOLAM HCL 2 MG/2ML IJ SOLN
INTRAMUSCULAR | Status: AC
  Filled 2020-12-20: qty 2

## 2020-12-20 MED ORDER — SODIUM BICARBONATE 8.4 % IV SOLN
100.0000 meq | Freq: Once | INTRAVENOUS | Status: AC
  Administered 2020-12-20: 100 meq via INTRAVENOUS

## 2020-12-20 MED ORDER — LIDOCAINE 2% (20 MG/ML) 5 ML SYRINGE
INTRAMUSCULAR | Status: DC | PRN
  Administered 2020-12-20: 60 mg via INTRAVENOUS

## 2020-12-20 MED ORDER — ONDANSETRON HCL 4 MG/2ML IJ SOLN
INTRAMUSCULAR | Status: DC | PRN
  Administered 2020-12-20 (×2): 4 mg via INTRAVENOUS

## 2020-12-20 MED ORDER — HEPARIN SODIUM (PORCINE) 1000 UNIT/ML IJ SOLN
1000.0000 [IU] | Freq: Once | INTRAMUSCULAR | Status: AC
  Administered 2020-12-21: 1000 [IU] via INTRAVENOUS
  Filled 2020-12-20: qty 1

## 2020-12-20 MED ORDER — FENTANYL CITRATE (PF) 100 MCG/2ML IJ SOLN
50.0000 ug | Freq: Once | INTRAMUSCULAR | Status: AC
  Administered 2020-12-20: 50 ug via INTRAVENOUS
  Filled 2020-12-20: qty 2

## 2020-12-20 MED ORDER — SODIUM BICARBONATE 8.4 % IV SOLN
100.0000 meq | Freq: Once | INTRAVENOUS | Status: DC
  Filled 2020-12-20 (×3): qty 50

## 2020-12-20 MED ORDER — DOCUSATE SODIUM 50 MG/5ML PO LIQD: 100.0000 mg | Freq: Two times a day (BID) | ORAL | Status: DC | PRN

## 2020-12-20 MED ORDER — ALBUMIN HUMAN 5 % IV SOLN
INTRAVENOUS | Status: AC
  Filled 2020-12-20: qty 250

## 2020-12-20 MED ORDER — FENTANYL CITRATE (PF) 250 MCG/5ML IJ SOLN
INTRAMUSCULAR | Status: DC | PRN
  Administered 2020-12-20 (×2): 50 ug via INTRAVENOUS

## 2020-12-20 MED ORDER — PANTOPRAZOLE SODIUM 40 MG IV SOLR
40.0000 mg | Freq: Two times a day (BID) | INTRAVENOUS | Status: DC
  Administered 2020-12-20: 40 mg via INTRAVENOUS
  Filled 2020-12-20 (×2): qty 40

## 2020-12-20 MED ORDER — ONDANSETRON HCL 4 MG/2ML IJ SOLN: 4.0000 mg | Freq: Once | INTRAMUSCULAR | Status: DC | PRN

## 2020-12-20 MED ORDER — HEPARIN SODIUM (PORCINE) 5000 UNIT/ML IJ SOLN
5000.0000 [IU] | Freq: Three times a day (TID) | INTRAMUSCULAR | Status: DC
  Administered 2020-12-20 – 2020-12-23 (×10): 5000 [IU] via SUBCUTANEOUS
  Filled 2020-12-20 (×10): qty 1

## 2020-12-20 MED ORDER — FENTANYL CITRATE (PF) 100 MCG/2ML IJ SOLN
50.0000 ug | Freq: Four times a day (QID) | INTRAMUSCULAR | Status: DC | PRN
  Administered 2020-12-20 – 2020-12-31 (×11): 50 ug via INTRAVENOUS
  Filled 2020-12-20 (×10): qty 2

## 2020-12-20 MED ORDER — ROCURONIUM BROMIDE 10 MG/ML (PF) SYRINGE
PREFILLED_SYRINGE | INTRAVENOUS | Status: DC | PRN
  Administered 2020-12-20: 50 mg via INTRAVENOUS
  Administered 2020-12-20: 10 mg via INTRAVENOUS

## 2020-12-20 MED ORDER — SODIUM CHLORIDE 0.9 % IV SOLN: INTRAVENOUS | Status: DC | PRN

## 2020-12-20 MED ORDER — PIPERACILLIN-TAZOBACTAM 3.375 G IVPB
3.3750 g | Freq: Two times a day (BID) | INTRAVENOUS | Status: AC
  Administered 2020-12-20 – 2020-12-24 (×9): 3.375 g via INTRAVENOUS
  Filled 2020-12-20 (×10): qty 50

## 2020-12-20 MED ORDER — PHENYLEPHRINE HCL-NACL 10-0.9 MG/250ML-% IV SOLN
INTRAVENOUS | Status: DC | PRN
  Administered 2020-12-20: 25 ug/min via INTRAVENOUS

## 2020-12-20 MED ORDER — PHENYLEPHRINE 40 MCG/ML (10ML) SYRINGE FOR IV PUSH (FOR BLOOD PRESSURE SUPPORT)
PREFILLED_SYRINGE | INTRAVENOUS | Status: DC | PRN
  Administered 2020-12-20 (×2): 80 ug via INTRAVENOUS

## 2020-12-20 MED ORDER — 0.9 % SODIUM CHLORIDE (POUR BTL) OPTIME
TOPICAL | Status: DC | PRN
  Administered 2020-12-20: 1000 mL

## 2020-12-20 MED ORDER — ALBUMIN HUMAN 5 % IV SOLN
25.0000 g | Freq: Once | INTRAVENOUS | Status: AC
  Administered 2020-12-20: 12.5 g via INTRAVENOUS
  Administered 2020-12-20: 25 g via INTRAVENOUS

## 2020-12-20 MED ORDER — PIPERACILLIN-TAZOBACTAM IN DEX 2-0.25 GM/50ML IV SOLN
2.2500 g | Freq: Three times a day (TID) | INTRAVENOUS | Status: DC
  Filled 2020-12-20 (×2): qty 50

## 2020-12-20 MED ORDER — SODIUM BICARBONATE 8.4 % IV SOLN
INTRAVENOUS | Status: DC | PRN
Start: 2020-12-20 — End: 2020-12-20
  Administered 2020-12-20 (×2): 50 meq via INTRAVENOUS

## 2020-12-20 MED ORDER — FAMOTIDINE IN NACL 20-0.9 MG/50ML-% IV SOLN
20.0000 mg | Freq: Two times a day (BID) | INTRAVENOUS | Status: DC
  Administered 2020-12-20 (×2): 20 mg via INTRAVENOUS
  Filled 2020-12-20 (×3): qty 50

## 2020-12-20 MED ORDER — POLYETHYLENE GLYCOL 3350 17 G PO PACK: 17.0000 g | PACK | Freq: Every day | ORAL | Status: DC | PRN

## 2020-12-20 MED ORDER — PANTOPRAZOLE SODIUM 40 MG IV SOLR: 40.0000 mg | Freq: Every day | INTRAVENOUS | Status: DC

## 2020-12-20 MED ORDER — EPHEDRINE SULFATE-NACL 50-0.9 MG/10ML-% IV SOSY
PREFILLED_SYRINGE | INTRAVENOUS | Status: DC | PRN
  Administered 2020-12-20: 5 mg via INTRAVENOUS
  Administered 2020-12-20: 2.5 mg via INTRAVENOUS

## 2020-12-20 MED ORDER — CALCIUM CHLORIDE 10 % IV SOLN
INTRAVENOUS | Status: DC | PRN
  Administered 2020-12-20 (×2): 500 mg via INTRAVENOUS

## 2020-12-20 MED ORDER — PROPOFOL 10 MG/ML IV BOLUS
INTRAVENOUS | Status: DC | PRN
Start: 2020-12-20 — End: 2020-12-20
  Administered 2020-12-20: 120 mg via INTRAVENOUS

## 2020-12-20 MED ORDER — FENTANYL CITRATE (PF) 100 MCG/2ML IJ SOLN
25.0000 ug | INTRAMUSCULAR | Status: DC | PRN
  Administered 2020-12-20: 50 ug via INTRAVENOUS

## 2020-12-20 MED ORDER — ACETAMINOPHEN 10 MG/ML IV SOLN: 1000.0000 mg | Freq: Once | INTRAVENOUS | Status: DC | PRN

## 2020-12-20 MED ORDER — ALBUMIN HUMAN 5 % IV SOLN
12.5000 g | Freq: Once | INTRAVENOUS | Status: AC
  Administered 2020-12-20: 12.5 g via INTRAVENOUS

## 2020-12-20 MED ORDER — CHLORHEXIDINE GLUCONATE CLOTH 2 % EX PADS: 6.0000 | MEDICATED_PAD | Freq: Every day | CUTANEOUS | Status: DC

## 2020-12-20 MED ORDER — CHLORHEXIDINE GLUCONATE CLOTH 2 % EX PADS
6.0000 | MEDICATED_PAD | Freq: Every day | CUTANEOUS | Status: DC
  Administered 2020-12-20 – 2020-12-24 (×6): 6 via TOPICAL

## 2020-12-20 MED ORDER — PHENYLEPHRINE HCL-NACL 10-0.9 MG/250ML-% IV SOLN
0.0000 ug/min | INTRAVENOUS | Status: DC
Start: 2020-12-20 — End: 2020-12-20
  Administered 2020-12-20: 40 ug/min via INTRAVENOUS

## 2020-12-20 MED ORDER — VASOPRESSIN 20 UNIT/ML IV SOLN
INTRAVENOUS | Status: DC | PRN
  Administered 2020-12-20: 2 [IU] via INTRAVENOUS

## 2020-12-20 MED ORDER — PIPERACILLIN-TAZOBACTAM 3.375 G IVPB 30 MIN
3.3750 g | Freq: Once | INTRAVENOUS | Status: AC
  Administered 2020-12-20: 3.375 g via INTRAVENOUS
  Filled 2020-12-20: qty 50

## 2020-12-20 MED ORDER — ALBUMIN HUMAN 5 % IV SOLN: INTRAVENOUS | Status: DC | PRN

## 2020-12-20 MED ORDER — DEXTROSE 50 % IV SOLN
12.5000 g | Freq: Once | INTRAVENOUS | Status: AC
  Administered 2020-12-20: 12.5 g via INTRAVENOUS
  Filled 2020-12-20: qty 50

## 2020-12-20 SURGICAL SUPPLY — 49 items
BLADE CLIPPER SURG (BLADE) IMPLANT
CANISTER SUCT 3000ML PPV (MISCELLANEOUS) ×2 IMPLANT
CHLORAPREP W/TINT 26 (MISCELLANEOUS) ×2 IMPLANT
COVER SURGICAL LIGHT HANDLE (MISCELLANEOUS) ×2 IMPLANT
DRAPE LAPAROSCOPIC ABDOMINAL (DRAPES) ×2 IMPLANT
DRAPE UNIVERSAL (DRAPES) ×2 IMPLANT
DRAPE WARM FLUID 44X44 (DRAPES) ×2 IMPLANT
DRSG OPSITE POSTOP 4X10 (GAUZE/BANDAGES/DRESSINGS) IMPLANT
DRSG OPSITE POSTOP 4X8 (GAUZE/BANDAGES/DRESSINGS) IMPLANT
ELECT BLADE 6.5 EXT (BLADE) ×2 IMPLANT
ELECT CAUTERY BLADE 6.4 (BLADE) ×2 IMPLANT
ELECT REM PT RETURN 9FT ADLT (ELECTROSURGICAL) ×2
ELECTRODE REM PT RTRN 9FT ADLT (ELECTROSURGICAL) ×1 IMPLANT
GAUZE SPONGE 4X4 12PLY STRL LF (GAUZE/BANDAGES/DRESSINGS) ×2 IMPLANT
GLOVE BIO SURGEON STRL SZ 6.5 (GLOVE) ×2 IMPLANT
GLOVE BIO SURGEON STRL SZ7.5 (GLOVE) ×2 IMPLANT
GLOVE BIOGEL M STER SZ 6 (GLOVE) ×2 IMPLANT
GLOVE BIOGEL PI IND STRL 6 (GLOVE) ×1 IMPLANT
GLOVE BIOGEL PI INDICATOR 6 (GLOVE) ×1
GLOVE BIOGEL PI ORTHO PRO 7.5 (GLOVE) ×1
GLOVE ECLIPSE 6.5 STRL STRAW (GLOVE) ×2 IMPLANT
GLOVE INDICATOR 7.0 STRL GRN (GLOVE) ×2 IMPLANT
GLOVE INDICATOR 7.5 STRL GRN (GLOVE) ×2 IMPLANT
GLOVE INDICATOR 8.0 STRL GRN (GLOVE) ×2 IMPLANT
GLOVE PI ORTHO PRO STRL 7.5 (GLOVE) ×1 IMPLANT
GLOVE SURG SS PI 7.5 STRL IVOR (GLOVE) ×2 IMPLANT
GOWN STRL REUS W/ TWL LRG LVL3 (GOWN DISPOSABLE) ×2 IMPLANT
GOWN STRL REUS W/TWL LRG LVL3 (GOWN DISPOSABLE) ×4
HANDLE SUCTION POOLE (INSTRUMENTS) ×1 IMPLANT
KIT BASIN OR (CUSTOM PROCEDURE TRAY) ×2 IMPLANT
KIT TURNOVER KIT B (KITS) ×2 IMPLANT
LIGASURE IMPACT 36 18CM CVD LR (INSTRUMENTS) IMPLANT
NS IRRIG 1000ML POUR BTL (IV SOLUTION) ×4 IMPLANT
PACK GENERAL/GYN (CUSTOM PROCEDURE TRAY) ×2 IMPLANT
PAD ARMBOARD 7.5X6 YLW CONV (MISCELLANEOUS) ×2 IMPLANT
PENCIL SMOKE EVACUATOR (MISCELLANEOUS) ×2 IMPLANT
SPONGE LAP 18X18 RF (DISPOSABLE) ×4 IMPLANT
STAPLER VISISTAT 35W (STAPLE) ×2 IMPLANT
SUCTION POOLE HANDLE (INSTRUMENTS) ×2
SUT PDS AB 1 TP1 54 (SUTURE) IMPLANT
SUT PDS AB 1 TP1 96 (SUTURE) IMPLANT
SUT SILK 2 0 SH CR/8 (SUTURE) ×2 IMPLANT
SUT SILK 2 0 TIES 10X30 (SUTURE) ×2 IMPLANT
SUT SILK 3 0 SH CR/8 (SUTURE) ×2 IMPLANT
SUT SILK 3 0 TIES 10X30 (SUTURE) ×2 IMPLANT
SUT VIC AB 3-0 SH 18 (SUTURE) IMPLANT
TOWEL GREEN STERILE (TOWEL DISPOSABLE) ×2 IMPLANT
TRAY FOLEY MTR SLVR 16FR STAT (SET/KITS/TRAYS/PACK) IMPLANT
YANKAUER SUCT BULB TIP NO VENT (SUCTIONS) IMPLANT

## 2020-12-20 NOTE — Transfer of Care (Signed)
Immediate Anesthesia Transfer of Care Note  Patient: [name]  Procedure(s) Performed: EXPLORATORY LAPAROTOMY (N/A Abdomen)  Patient Location: PACU  Anesthesia Type:General  Level of Consciousness: Patient remains intubated per anesthesia plan  Airway & Oxygen Therapy: Patient remains intubated per anesthesia plan and Patient placed on Ventilator (see vital sign flow sheet for setting)  Post-op Assessment: Report given to RN and Post -op Vital signs reviewed and stable  Post vital signs: Reviewed and stable  Last Vitals:  Vitals Value Taken Time  BP 149/63 12/25/2020 0719  Temp 35.6 C 12/25/2020 0630  Pulse 84 12/07/2020 0726  Resp 19 12/12/2020 0726  SpO2 100 % 12/11/2020 0726  Vitals shown include unvalidated device data.  Last Pain:  Vitals:   12/30/2020 2155  TempSrc: Axillary     Report to Chip RN and Restaurant manager, [employer] food. See intermittent notes in anesthesia record for precise timing of events. Patient noted to be hypotensive in PACU, unable to extubate patient in OR, Stoltzfus MD and this CRNA present in PACU, neo gtt started, ABG drawn, metabolic acidosis resulted, sodium bicarb administered x 2, calcium chloride, albumin, NS, and intermittent vasopressin administered, Ellender MD transition care to ICU, currently VSS with arterial line insertion via this CRNA. Hemodialysis access only IV access at this time.      Complications: No complications documented.
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
## 2020-12-20 NOTE — Progress Notes (Addendum)
Patient alert and oriented extubated pt states that staff can information to his s/o "girlfriend". Kerry Fort She was added to contact information and given an update via phone

## 2020-12-20 NOTE — Progress Notes (Signed)
ABG drawn on patient with the following results  pH: 7.27  Co2: 28  Pa02: 196  HCO3: 12.6  S02: 100  Vent Settings: PRVC/620/14/100/+5  Temp: 96.0

## 2020-12-20 NOTE — Progress Notes (Signed)
ABG sent/drawn by RT

## 2020-12-20 NOTE — Anesthesia Procedure Notes (Signed)
Procedure Name: Intubation Date/Time: 12/06/2020 3:46 AM Performed by: Jearld Pies, CRNA Pre-anesthesia Checklist: Patient identified, Emergency Drugs available, Suction available and Patient being monitored Patient Re-evaluated:Patient Re-evaluated prior to induction Oxygen Delivery Method: Circle System Utilized Preoxygenation: Pre-oxygenation with 100% oxygen Induction Type: IV induction, Rapid sequence and Cricoid Pressure applied Laryngoscope Size: Glidescope and 4 Grade View: Grade I Tube type: Oral Tube size: 7.5 mm Number of attempts: 1 Airway Equipment and Method: Stylet and Video-laryngoscopy Placement Confirmation: ETT inserted through vocal cords under direct vision,  positive ETCO2 and breath sounds checked- equal and bilateral Secured at: 22 cm Tube secured with: Tape Dental Injury: Teeth and Oropharynx as per pre-operative assessment  Comments: Glidescope utilized d/t COVID + status.

## 2020-12-20 NOTE — Anesthesia Preprocedure Evaluation (Addendum)
Anesthesia Evaluation  Patient identified by MRN, date of birth, ID band Patient awake    Reviewed: Allergy & Precautions, Patient's Chart, lab work & pertinent test results  Airway Mallampati: II  TM Distance: >3 FB Neck ROM: Full    Dental  (+) Teeth Intact   Pulmonary  COVID+   Pulmonary exam normal        Cardiovascular + dysrhythmias Atrial Fibrillation  Rhythm:Irregular Rate:Normal     Neuro/Psych negative neurological ROS  negative psych ROS   GI/Hepatic Neg liver ROS, Large bowel obstruction   Endo/Other  diabetes, Type 2  Renal/GU ESRF and DialysisRenal disease  negative genitourinary   Musculoskeletal negative musculoskeletal ROS (+)   Abdominal (+)  Abdomen: soft. Bowel sounds: normal.  Peds  Hematology  (+) anemia ,   Anesthesia Other Findings   Reproductive/Obstetrics                             Anesthesia Physical Anesthesia Plan  ASA: III and emergent  Anesthesia Plan: General   Post-op Pain Management:    Induction: Intravenous and Rapid sequence  PONV Risk Score and Plan: 2 and Ondansetron, Dexamethasone and Treatment may vary due to age or medical condition  Airway Management Planned: Mask and Oral ETT  Additional Equipment: None  Intra-op Plan:   Post-operative Plan: Extubation in OR  Informed Consent: I have reviewed the patients History and Physical, chart, labs and discussed the procedure including the risks, benefits and alternatives for the proposed anesthesia with the patient or authorized representative who has indicated his/her understanding and acceptance.     Dental advisory given  Plan Discussed with: CRNA  Anesthesia Plan Comments: (Lab Results      Component                Value               Date                      WBC                      15.4 (H)            12/03/2020                HGB                      8.8 (L)              12/23/2020                HCT                      29.4 (L)            12/23/2020                MCV                      97.7                12/01/2020                PLT                      93 (L)  12/08/2020           Lab Results      Component                Value               Date                      NA                       139                 12/22/2020                K                        3.4 (L)             12/28/2020                CO2                      11 (L)              12/17/2020                GLUCOSE                  66 (L)              12/07/2020                BUN                      61 (H)              12/15/2020                CREATININE               5.27 (H)            12/18/2020                CALCIUM                  4.9 (LL)            12/14/2020                GFRNONAA                 11 (L)              12/01/2020                GFRAA                    19 (L)              06/15/2018            ECHO 11/21: 1. Left ventricular ejection fraction, by estimation, is 60 to 65%. The  left ventricle has normal function. The left ventricle has no regional  wall motion abnormalities. Left ventricular diastolic parameters are  consistent with Grade I diastolic  dysfunction (impaired relaxation). Elevated left atrial pressure.  2. Right ventricular systolic function is normal. The right ventricular  size is normal. There is normal pulmonary artery systolic pressure.  3. The mitral valve is normal  in structure. No evidence of mitral valve  regurgitation. No evidence of mitral stenosis.  4. The aortic valve is normal in structure. Aortic valve regurgitation is  not visualized. No aortic stenosis is present.  5. There is Moderate (Grade III) protruding plaque involving the  transverse aorta.  6. The inferior vena cava is normal in size with greater than 50%  respiratory variability, suggesting right atrial pressure of 3 mmHg. )        Anesthesia Quick Evaluation

## 2020-12-20 NOTE — Progress Notes (Signed)
Dr  Ellender/mda made aware of downward drift in bp since 0800/ neo has been off, given albumin 520mls total by previous rn/ orders to restart neo per protocol / aline is 44mmhg below cuffr pressure ( rle)

## 2020-12-20 NOTE — Anesthesia Procedure Notes (Signed)
Arterial Line Insertion Start/End01/08/2021 7:10 AM, 12/20/2020 7:15 AM Performed by: Jearld Pies, CRNA, CRNA  Patient location: PACU. Emergency situation Right, radial was placed Catheter size: 20 G Hand hygiene performed  and Seldinger technique used Allen's test indicative of satisfactory collateral circulation Attempts: 1 Procedure performed without using ultrasound guided technique. Following insertion, dressing applied and Biopatch. Post procedure assessment: normal  Patient tolerated the procedure well with no immediate complications.

## 2020-12-20 NOTE — Progress Notes (Signed)
Pharmacy Antibiotic Note  [name] is a 70 y.o. male admitted on 12/03/2020 with perforated colon.  Pharmacy has been consulted for Zosyn dosing.  Plan: Zosyn 3.375g IV x1 followed by 2.25g IV Q8H.  Height: 6\' 2"  (188 cm) Weight: 61.2 kg (134 lb 14.7 oz) IBW/kg (Calculated) : 82.2  Temp (24hrs), Avg:98.6 F (37 C), Min:98.1 F (36.7 C), Max:99.1 F (37.3 C)  Recent Labs  Lab 12/18/20 1145 12/08/2020 0040  WBC 7.2 15.4*  CREATININE 5.83*  --     Estimated Creatinine Clearance: 10.2 mL/min (A) (by C-G formula based on SCr of 5.83 mg/dL (H)).    No Known Allergies   Thank you for allowing pharmacy to be a part of this patient's care.  Wynona Neat, PharmD, BCPS  12/15/2020 2:05 AM

## 2020-12-20 NOTE — Procedures (Signed)
Extubation Procedure Note  Patient Details:   Name: [name] DOB: 1951/11/19 MRN: [id number]   Airway Documentation:    Vent end date: 12/31/2020 Vent end time: 1215   Evaluation  O2 sats: stable throughout Complications: No apparent complications Patient did tolerate procedure well. Bilateral Breath Sounds: Clear,Diminished   Yes   Pt extubated to 4l [place] per MD order. Pt stable throughout with no complications. Positive cuff leak noted prior to extubation. Pt has strong, productive cough post extubation with small tan yellow secretions. Pt able to speak, VS within normal limits.   Jesse Sans 12/06/2020, 4:37 PM

## 2020-12-20 NOTE — H&P (Signed)
NAME:  [name], MRN:  [id number], DOB:  07-28-1951, LOS: 0 ADMISSION DATE:  12/25/2020, CONSULTATION DATE: 12/27/2020 REFERRING MD:  Jesusita Oka, MD, CHIEF COMPLAINT: Acute hypoxic respiratory failure post laparotomy  Brief History:  70 year old male with end-stage renal disease on dialysis TTS, missed HD session on Tuesday, coming in with nausea vomiting abdominal pain, noted to have pneumoperitoneum.  Underwent laparotomy.  COVID-positive  History of Present Illness:  70 year old male with diabetes and end stage renal disease on HD, TTS, his last dialysis was last Saturday, missed session on Tuesday, presented with nausea vomiting and abdominal pain for 2 days.  In the ED work-up showed pneumoperitoneum/perforation of the distal colon with transition point, patient was seen by general surgery and underwent emergent laparotomy.  Post surgery patient was brought to the PACU, he was noted to be COVID-positive.  He was transferred to ICU for further care.  Past Medical History:  End-stage renal disease on dialysis Diabetes type 2 Paroxysmal A. fib Orthostatic hypotension Unstageable sacral decubitus ulcer (POA)  Significant Hospital Events:    Consults:  General surgery  Procedures:   1/20  exploratory laparotomy ETT 1/20>  Significant Diagnostic Tests:  1/20 CT abdomen and pelvis: There is a focal area of wall thickening with adjacent perforation of the distal descending colon at the junction of the sigmoid colon, likely the transition point. 2. There is moderately dilated sigmoid colon and rectum which is stool-filled and suggestion of possible pneumatosis. 3. Free air seen under the right hemidiaphragm and within the porta hepatis. 4. Small amount of air seen within the inferior right liver lobe which is concerning for portal venous gas given the patient's findings 5. Bilateral sacral decubitus ulceration with phlegmon and subcutaneous emphysema extending to the  inferior coccyx.  Micro Data:  1/20 influenza PCR negative 1/20 COVID PCR positive  Antimicrobials:  Zosyn 1/19 >  Interim History / Subjective:    Objective   Blood pressure (!) 139/39, pulse 78, temperature (!) 97 F (36.1 C), resp. rate 16, height 6\' 2"  (1.88 m), weight 61.2 kg, SpO2 100 %.    Vent Mode: PRVC FiO2 (%):  [40 %-60 %] 40 % Set Rate:  [12 bmp-16 bmp] 16 bmp Vt Set:  [620 mL] 620 mL PEEP:  [5 cmH20] 5 cmH20 Plateau Pressure:  [14 cmH20] 14 cmH20   Intake/Output Summary (Last 24 hours) at 12/15/2020 1025 Last data filed at 12/14/2020 0700 Gross per 24 hour  Intake 530 ml  Output 50 ml  Net 480 ml   Filed Weights   12/10/2020 2141  Weight: 61.2 kg    Examination:   Physical exam: General: Chronically ill-appearing male, orally intubated HEENT: [place]/AT, eyes anicteric.  ETT and OGT in place Neuro: Opens eyes with vocal stimuli, following simple commands, moving all 4 extremities Chest: Coarse breath sounds, no wheezes or rhonchi Heart: Regular rate and rhythm, no murmurs or gallops Abdomen: Soft, surgical scar noted midline, well dressed, no discharge, bowel sounds sluggish Skin: Large decubitus ulcer noted at sacrum, unstageable   Resolved Hospital Problem list     Assessment & Plan:  Distal colonic perforation with pneumoperitoneum Large bowel obstruction status post exploratory laparotomy Acute metabolic acidosis Hypokalemia End-stage renal disease on dialysis Acute hypoxic respiratory failure due to volume overload from missing HD session Diabetes type 2 Anemia of critical illness  General surgery consult is appreciated, patient underwent ex laparotomy Continue Zosyn Patient received multiple pushes of IV bicarbonate His serum bicarbonate is 11,  due to missing his regular HD session He is volume overloaded tests were required mechanical ventilator Nephrology has been consulted patient will go for urgent dialysis Monitor BMP with  potassium Once he is done with HD session, will do a spontaneous breathing trial to see if patient can be extubated Continue sliding scale insulin Patient H&H is stable, continue to monitor Transfuse if <7  Best practice (evaluated daily)  Diet: NPO Pain/Anxiety/Delirium protocol (if indicated): Propofol for now VAP protocol (if indicated): Ordered DVT prophylaxis: Subcu heparin GI prophylaxis: Famotidine Glucose control: SSI Mobility: Bedrest for now Disposition: ICU  Goals of Care:   Code Status: Full code  Labs   CBC: Recent Labs  Lab 12/18/20 1145 12/14/2020 0040  WBC 7.2 15.4*  NEUTROABS 5.8 13.4*  HGB 8.8* 8.8*  HCT 30.0* 29.4*  MCV 101.7* 97.7  PLT 120* 93*    Basic Metabolic Panel: Recent Labs  Lab 12/18/20 1145 12/19/2020 0040  NA 136 139  K 4.0 3.4*  CL 102 113*  CO2 18* 11*  GLUCOSE 73 66*  BUN 54* 61*  CREATININE 5.83* 5.27*  CALCIUM 7.1* 4.9*   GFR: Estimated Creatinine Clearance: 11.3 mL/min (A) (by C-G formula based on SCr of 5.27 mg/dL (H)). Recent Labs  Lab 12/18/20 1145 12/22/2020 0040  WBC 7.2 15.4*    Liver Function Tests: Recent Labs  Lab 12/18/2020 0040  AST 28  ALT 7  ALKPHOS 68  BILITOT 0.9  PROT 3.6*  ALBUMIN 1.3*   Recent Labs  Lab 12/11/2020 0040  LIPASE 14   No results for input(s): AMMONIA in the last 168 hours.  ABG    Component Value Date/Time   PHART 7.261 (L) 12/21/2020 0905   PCO2ART 30.1 (L) 12/12/2020 0905   PO2ART 149 (H) 12/29/2020 0905   HCO3 13.3 (L) 12/08/2020 0905   TCO2 18 (L) 06/08/2018 1502   ACIDBASEDEF 12.6 (H) 12/28/2020 0905   O2SAT 97.0 12/03/2020 0905     Coagulation Profile: No results for input(s): INR, PROTIME in the last 168 hours.  Cardiac Enzymes: No results for input(s): CKTOTAL, CKMB, CKMBINDEX, TROPONINI in the last 168 hours.  HbA1C: Hgb A1c MFr Bld  Date/Time Value Ref Range Status  06/13/2018 03:16 AM 6.6 (H) 4.8 - 5.6 % Final    Comment:    (NOTE) Pre diabetes:           5.7%-6.4% Diabetes:              >6.4% Glycemic control for   <7.0% adults with diabetes     CBG: Recent Labs  Lab 12/23/2020 0433  GLUCAP 90    Review of Systems:   Unable to obtain as patient is intubated  Past Medical History:  He,  has a past medical history of Diabetes mellitus, type 2 ([place]), ESRD on hemodialysis ([place]), GI bleed, Orthostatic hypotension, and PAF (paroxysmal atrial fibrillation) ([place]).   Surgical History:   Past Surgical History:  Procedure Laterality Date  . BIOPSY  10/17/2020   Procedure: BIOPSY;  Surgeon: Rogene Houston, MD;  [facility];  Service: Endoscopy;;  ascending and descending  . BIOPSY  11/21/2020   Procedure: BIOPSY;  Surgeon: Rogene Houston, MD;  [facility];  Service: Endoscopy;;  . COLONOSCOPY WITH PROPOFOL N/A 10/17/2020   Procedure: COLONOSCOPY WITH PROPOFOL;  Surgeon: Rogene Houston, MD;  [facility];  Service: Endoscopy;  Laterality: N/A;  . COLONOSCOPY WITH PROPOFOL N/A 11/21/2020   Procedure: COLONOSCOPY  WITH PROPOFOL;  Surgeon: Rogene Houston, MD;  [facility];  Service: Endoscopy;  Laterality: N/A;  . EXTERNAL FIXATION LEG Right 06/08/2018   Procedure: EXTERNAL FIXATION COMMINUTED FIBULA FRACTURE;  Surgeon: Netta Cedars, MD;  Location: [place];  Service: Orthopedics;  Laterality: Right;  . EXTERNAL FIXATION REMOVAL Right 06/11/2018   Procedure: REMOVAL EXTERNAL FIXATION LEG;  Surgeon: Shona Needles, MD;  Location: [place];  Service: Orthopedics;  Laterality: Right;  . I & D EXTREMITY Right 06/08/2018   Procedure: IRRIGATION AND DEBRIDEMENT OPEN TIBIA FRACTURE;  Surgeon: Netta Cedars, MD;  Location: [place];  Service: Orthopedics;  Laterality: Right;  . I & D EXTREMITY Right 06/11/2018   Procedure: IRRIGATION AND DEBRIDEMENT EXTREMITY;  Surgeon: Shona Needles, MD;  Location: [place];  Service: Orthopedics;  Laterality: Right;  . INSERTION OF DIALYSIS CATHETER Right 12/07/2020    Procedure: INSERTION OF DIALYSIS CATHETER;  Surgeon: Virl Cagey, MD;  [facility];  Service: General;  Laterality: Right;  Exhange of catheter over wire versus new catheter  . INSERTION OF DIALYSIS CATHETER Right 12/10/2020   Procedure: INSERTION OF DIALYSIS CATHETER;  Surgeon: Virl Cagey, MD;  [facility];  Service: General;  Laterality: Right;  . NOSE SURGERY    . ORIF ANKLE FRACTURE Right 06/08/2018   Procedure: OPEN REDUCTION INTERNAL FIXATION (ORIF) MEDIAL MALLEOLUS;  Surgeon: Netta Cedars, MD;  Location: [place];  Service: Orthopedics;  Laterality: Right;  . ORIF ANKLE FRACTURE Right 06/11/2018   Procedure: OPEN REDUCTION INTERNAL FIXATION (ORIF) ANKLE FRACTURE;  Surgeon: Shona Needles, MD;  Location: [place];  Service: Orthopedics;  Laterality: Right;  . POLYPECTOMY  11/21/2020   Procedure: POLYPECTOMY;  Surgeon: Rogene Houston, MD;  [facility];  Service: Endoscopy;;  . TIBIA DEBRIDEMENT Right 06/08/2018     Social History:   reports that he has never smoked. He has never used smokeless tobacco. He reports current alcohol use. He reports that he does not use drugs.   Family History:  His family history includes Hypertension in his father.   Allergies No Known Allergies   Home Medications  Prior to Admission medications   Medication Sig Start Date End Date Taking? Authorizing Provider  acetaminophen (TYLENOL) 325 MG tablet Take 2 tablets (650 mg total) by mouth every 6 (six) hours as needed for mild pain (or Fever >/= 101). 12/10/20   Johnson, Clanford L, MD  amiodarone (PACERONE) 200 MG tablet Take 200 mg by mouth daily. 10/01/20   [provider]  calcitRIOL (ROCALTROL) 0.5 MCG capsule Take 1 capsule (0.5 mcg total) by mouth daily. 12/11/20   Johnson, Clanford L, MD  collagenase (SANTYL) ointment Apply topically daily. 12/10/20   Johnson, Clanford L, MD  famotidine (PEPCID) 20 MG tablet Take 1 tablet (20 mg total) by mouth daily. 12/11/20    Johnson, Clanford L, MD  feeding supplement (ENSURE ENLIVE / ENSURE PLUS) LIQD Take 237 mLs by mouth 2 (two) times daily between meals. 12/10/20   Johnson, Clanford L, MD  finasteride (PROSCAR) 5 MG tablet Take 1 tablet (5 mg total) by mouth daily. 12/11/20   Johnson, Clanford L, MD  fludrocortisone (FLORINEF) 0.1 MG tablet Take 2 tablets (0.2 mg total) by mouth daily. 12/11/20   Johnson, Clanford L, MD  metoprolol tartrate (LOPRESSOR) 25 MG tablet Take 0.5 tablets (12.5 mg total) by mouth at bedtime. 12/10/20   Johnson, Clanford L, MD  midodrine (PROAMATINE) 10 MG tablet Take 1  tablet (10 mg total) by mouth with breakfast, with lunch, and with evening meal. 11/01/20   Dessa Phi, DO  multivitamin (RENA-VIT) TABS tablet Take 1 tablet by mouth at bedtime. 12/10/20   Johnson, Clanford L, MD  ondansetron (ZOFRAN) 4 MG tablet Take 4 mg by mouth every 6 (six) hours as needed for nausea.  10/09/20   [provider]  pantoprazole (PROTONIX) 40 MG tablet Take 1 tablet (40 mg total) by mouth 2 (two) times daily before a meal. 12/10/20   Johnson, Clanford L, MD  psyllium (HYDROCIL/METAMUCIL) 95 % PACK Take 1 packet by mouth at bedtime. 12/10/20   Johnson, Clanford L, MD  pyridostigmine (MESTINON) 60 MG tablet Take 1 tablet (60 mg total) by mouth daily. 12/11/20   Johnson, Clanford L, MD  simvastatin (ZOCOR) 20 MG tablet Take 20 mg by mouth every evening. 04/30/18   [provider]     Total critical care time: 47 minutes  Performed by: [place] care time was exclusive of separately billable procedures and treating other patients.   Critical care was necessary to treat or prevent imminent or life-threatening deterioration.   Critical care was time spent personally by me on the following activities: development of treatment plan with patient and/or surrogate as well as nursing, discussions with consultants, evaluation of patient's response to treatment, examination of patient,  obtaining history from patient or surrogate, ordering and performing treatments and interventions, ordering and review of laboratory studies, ordering and review of radiographic studies, pulse oximetry and re-evaluation of patient's condition.   Jacky Kindle MD [facility] Pager: [phone] Mobile: [phone]

## 2020-12-20 NOTE — Op Note (Addendum)
   Operative Note   Date: 12/24/2020  Procedure: exploratory laparotomy  Pre-op diagnosis: pneumoperitoneum Post-op diagnosis: pneumoperitoneum, congested rectosigmoid colon  Indication and clinical history: The patient is a 70 y.o. year old male presented with abdominal pain, found to have pneumoperitoneum, pneumatosis of the distal colon, and pneumobilia on CT.   Surgeon: Jesusita Oka, MD Assistant: Dema Severin, MD  Anesthesiologist: Rex Kras, MD  Anesthesia: General  Findings:  . Specimen: none . EBL: <15cc . Drains/Implants: none  Disposition: PACU - hemodynamically stable.  Description of procedure: The patient was positioned supine on the operating room table. General anesthetic induction and intubation were uneventful. Foley catheter insertion was performed and was atraumatic. The abdomen was prepped and draped in the usual sterile fashion. Time-out was performed verifying correct patient, procedure, signature of informed consent, and administration of pre-operative antibiotics.  The procedure began with a lower midline incision. Upon entry into the peritoneal cavity, a foul-smelling rush of air was encountered. The bowel appeared viable and without evidence of perforation. The bowel was run from ligament of Treitz to the peritoneal reflection at the level of the rectum and no perforation was identified. The stomach was inspected on both its anterior and posterior surfaces and no perforation was identified. The duodenal sweep was without perforation and without bile-staining. The gallbladder was significantly distended, but the wall did not appear ischemic and there was no leakage of bile. The sigmoid colon was submerged in water and evaluated for leakage of air, which was not identified. The bowel was again run from ligament of Treitz to peritoneal reflection and the anterior and posterior surfaces of the stomach evaluated. There was no evidence of perforation. There was an area of  the rectosigmoid junction that appeared congested and this area was further assessed. Doppler exam confirmed brisk arterial flow to this section of the bowel and venous signals were also auscultated. There was brisk capillary refill of the superficial vessels. The abdomen was irrigated and the fluid returned clear. The fascia was closed with #1 looped PDS suture in a running fashion and the skin closed with staples.   Sterile dressings were applied. All sponge and instrument counts were correct at the conclusion of the procedure. The patient was awakened from anesthesia, extubated uneventfully, and transported to the PACU in good condition. There were no complications.   No family contacted per pre-operative request from patient.    Jesusita Oka, MD General and [place] [facility]

## 2020-12-20 NOTE — Consult Note (Addendum)
Reason for Consult/Chief Complaint: pneumoperitoneum Consultant: Charlyne Quale, PA  [name] is an 70 y.o. male.   HPI: 64M reports abdominal pain x1 week. Associated swelling in b/l UE, nausea, vomiting. Denies fevers/chills.   Past Medical History:  Diagnosis Date  . Diabetes mellitus, type 2 ([place])   . ESRD on hemodialysis ([place])   . GI bleed   . Orthostatic hypotension   . PAF (paroxysmal atrial fibrillation) ([place])     Past Surgical History:  Procedure Laterality Date  . BIOPSY  10/17/2020   Procedure: BIOPSY;  Surgeon: Rogene Houston, MD;  [facility];  Service: Endoscopy;;  ascending and descending  . BIOPSY  11/21/2020   Procedure: BIOPSY;  Surgeon: Rogene Houston, MD;  [facility];  Service: Endoscopy;;  . COLONOSCOPY WITH PROPOFOL N/A 10/17/2020   Procedure: COLONOSCOPY WITH PROPOFOL;  Surgeon: Rogene Houston, MD;  [facility];  Service: Endoscopy;  Laterality: N/A;  . COLONOSCOPY WITH PROPOFOL N/A 11/21/2020   Procedure: COLONOSCOPY WITH PROPOFOL;  Surgeon: Rogene Houston, MD;  [facility];  Service: Endoscopy;  Laterality: N/A;  . EXTERNAL FIXATION LEG Right 06/08/2018   Procedure: EXTERNAL FIXATION COMMINUTED FIBULA FRACTURE;  Surgeon: Netta Cedars, MD;  Location: [facility];  Service: Orthopedics;  Laterality: Right;  . EXTERNAL FIXATION REMOVAL Right 06/11/2018   Procedure: REMOVAL EXTERNAL FIXATION LEG;  Surgeon: Shona Needles, MD;  Location: [facility];  Service: Orthopedics;  Laterality: Right;  . I & D EXTREMITY Right 06/08/2018   Procedure: IRRIGATION AND DEBRIDEMENT OPEN TIBIA FRACTURE;  Surgeon: Netta Cedars, MD;  Location: [facility];  Service: Orthopedics;  Laterality: Right;  . I & D EXTREMITY Right 06/11/2018   Procedure: IRRIGATION AND DEBRIDEMENT EXTREMITY;  Surgeon: Shona Needles, MD;  Location: [facility];  Service: Orthopedics;  Laterality: Right;  . INSERTION OF DIALYSIS CATHETER Right 12/07/2020   Procedure:  INSERTION OF DIALYSIS CATHETER;  Surgeon: Virl Cagey, MD;  Location: [facility];  Service: General;  Laterality: Right;  Exhange of catheter over wire versus new catheter  . INSERTION OF DIALYSIS CATHETER Right 12/10/2020   Procedure: INSERTION OF DIALYSIS CATHETER;  Surgeon: Virl Cagey, MD;  Location: [facility];  Service: General;  Laterality: Right;  . NOSE SURGERY    . ORIF ANKLE FRACTURE Right 06/08/2018   Procedure: OPEN REDUCTION INTERNAL FIXATION (ORIF) MEDIAL MALLEOLUS;  Surgeon: Netta Cedars, MD;  Location: [facility];  Service: Orthopedics;  Laterality: Right;  . ORIF ANKLE FRACTURE Right 06/11/2018   Procedure: OPEN REDUCTION INTERNAL FIXATION (ORIF) ANKLE FRACTURE;  Surgeon: Shona Needles, MD;  Location: [facility];  Service: Orthopedics;  Laterality: Right;  . POLYPECTOMY  11/21/2020   Procedure: POLYPECTOMY;  Surgeon: Rogene Houston, MD;  [facility];  Service: Endoscopy;;  . TIBIA DEBRIDEMENT Right 06/08/2018    Family History  Problem Relation Age of Onset  . Hypertension Father     Social History:  reports that he has never smoked. He has never used smokeless tobacco. He reports current alcohol use. He reports that he does not use drugs.  Allergies: No Known Allergies  Medications: I have reviewed the patient's current medications.  Results for orders placed or performed during the hospital encounter of 12/17/2020 (from the past 48 hour(s))  CBC with Differential     Status: Abnormal   Collection Time: 12/31/2020 12:40 AM  Result Value Ref Range   WBC 15.4 (H) 4.0 -  10.5 K/uL   RBC 3.01 (L) 4.22 - 5.81 MIL/uL   Hemoglobin 8.8 (L) 13.0 - 17.0 g/dL   HCT 29.4 (L) 39.0 - 52.0 %   MCV 97.7 80.0 - 100.0 fL   MCH 29.2 26.0 - 34.0 pg   MCHC 29.9 (L) 30.0 - 36.0 g/dL   RDW 20.5 (H) 11.5 - 15.5 %   Platelets 93 (L) 150 - 400 K/uL    Comment: REPEATED TO VERIFY PLATELET COUNT CONFIRMED BY SMEAR Immature Platelet Fraction may be clinically indicated,  consider ordering this additional test [id number]    nRBC 0.0 0.0 - 0.2 %   Neutrophils Relative % 87 %   Neutro Abs 13.4 (H) 1.7 - 7.7 K/uL   Lymphocytes Relative 5 %   Lymphs Abs 0.8 0.7 - 4.0 K/uL   Monocytes Relative 7 %   Monocytes Absolute 1.1 (H) 0.1 - 1.0 K/uL   Eosinophils Relative 0 %   Eosinophils Absolute 0.0 0.0 - 0.5 K/uL   Basophils Relative 0 %   Basophils Absolute 0.0 0.0 - 0.1 K/uL   Immature Granulocytes 1 %   Abs Immature Granulocytes 0.14 (H) 0.00 - 0.07 K/uL    Comment: Performed at [facility], [address] [place], [place] [address]  Comprehensive metabolic panel     Status: Abnormal   Collection Time: 12/05/2020 12:40 AM  Result Value Ref Range   Sodium 139 135 - 145 mmol/L   Potassium 3.4 (L) 3.5 - 5.1 mmol/L   Chloride 113 (H) 98 - 111 mmol/L   CO2 11 (L) 22 - 32 mmol/L   Glucose, Bld 66 (L) 70 - 99 mg/dL    Comment: Glucose reference range applies only to samples taken after fasting for at least 8 hours.   BUN 61 (H) 8 - 23 mg/dL   Creatinine, Ser 5.27 (H) 0.61 - 1.24 mg/dL   Calcium 4.9 (LL) 8.9 - 10.3 mg/dL    Comment: CRITICAL RESULT CALLED TO, READ BACK BY AND VERIFIED WITHEliott Nine RN [id number] M GARRETT    Total Protein 3.6 (L) 6.5 - 8.1 g/dL   Albumin 1.3 (L) 3.5 - 5.0 g/dL   AST 28 15 - 41 U/L   ALT 7 0 - 44 U/L   Alkaline Phosphatase 68 38 - 126 U/L   Total Bilirubin 0.9 0.3 - 1.2 mg/dL   GFR, Estimated 11 (L) >60 mL/min    Comment: (NOTE) Calculated using the CKD-EPI Creatinine Equation (2021)    Anion gap 15 5 - 15    Comment: Performed at [facility] [address], [place], [place] [address]  Lipase, blood     Status: None   Collection Time: 12/28/2020 12:40 AM  Result Value Ref Range   Lipase 14 11 - 51 U/L    Comment: Performed at [facility] [address], [place], [place] [address]    CT Abdomen Pelvis Wo Contrast  Result Date: 12/08/2020 CLINICAL DATA:  Abdominal pain EXAM: CT ABDOMEN  AND PELVIS WITHOUT CONTRAST TECHNIQUE: Multidetector CT imaging of the abdomen and pelvis was performed following the standard protocol without IV contrast. COMPARISON:  November 07, 2020 and October 22, 2020 FINDINGS: Lower chest: The visualized heart size within normal limits. No pericardial fluid/thickening. No hiatal hernia. Small bilateral pleural effusions are seen. Hepatobiliary: A small amount of pneumobilia is seen in the inferior right liver lobe and surrounding the posterior gallbladder. A distended fluid and contrast filled gallbladder  is seen, likely due to vicarious contrast excretion. There is small layering gallstones. Pancreas:  Unremarkable.  No surrounding inflammatory changes. Spleen: Normal in size. Although limited due to the lack of intravenous contrast, normal in appearance. Adrenals/Urinary Tract: Both adrenal glands appear normal. Multiple bilateral renal calculi are again identified. The largest within the upper pole of the right kidney measuring 5 mm and the largest within the left kidney measuring 4 mm within the midpole. No hydronephrosis. The bladder is decompressed with a Foley catheter. Stomach/Bowel: Small amount of contrast reflux seen within the distal esophagus. There is mildly prominent contrast filled loops of jejunum seen within the left upper quadrant without a clear transition point the remainder of the small bowel is decompressed. There is been interval increased air and stool-filled dilation of the rectum and sigmoid colon to the level of the descending colon junction. At the level of the descending colon/sigmoid colon junction there is focal wall thickening and a small amount of free air seen anteriorly, consistent with a probable transition point and perforation. There is air seen surrounding the stool at the level of the sigmoid rectal junction which could be concerning for pneumatosis. Vascular/Lymphatic: Scattered aortic atherosclerosis is noted. Reproductive: The  prostate is unremarkable. Other: There is a moderate amount of free air seen under the hemidiaphragms and is seen surrounding the porta hepatis. Musculoskeletal: Bilateral sacral decubitus ulceration seen over the inferior coccyx with debris and foci of air. There is non loculated fluid in this area. Degenerative changes seen throughout the lumbar spine most notable of L2-L3. IMPRESSION: 1. There is a focal area of wall thickening with adjacent perforation of the distal descending colon at the junction of the sigmoid colon, likely the transition point. 2. There is moderately dilated sigmoid colon and rectum which is stool-filled and suggestion of possible pneumatosis. 3. Free air seen under the right hemidiaphragm and within the porta hepatis. 4. Small amount of air seen within the inferior right liver lobe which is concerning for portal venous gas given the patient's findings 5. Bilateral sacral decubitus ulceration with phlegmon and subcutaneous emphysema extending to the inferior coccyx. 6. These results were called by telephone at the time of interpretation on 12/17/2020 at 2:01 am to provider PA Sammy , who verbally acknowledged these results. Electronically Signed   By: Prudencio Pair M.D.   On: 12/15/2020 02:06    ROS 10 point review of systems is negative except as listed above in HPI.   Physical Exam Blood pressure 136/73, pulse 83, temperature 98.1 F (36.7 C), temperature source Axillary, resp. rate 15, height 6\' 2"  (1.88 m), weight 61.2 kg, SpO2 100 %. Constitutional: well-developed, well-nourished HEENT: pupils equal, round, reactive to light, 25mm b/l, moist conjunctiva, external inspection of ears and nose normal, hearing intact Oropharynx: normal oropharyngeal mucosa, normal dentition Neck: no thyromegaly, trachea midline, no midline cervical tenderness to palpation Chest: breath sounds equal bilaterally, normal respiratory effort, no midline or lateral chest wall tenderness to  palpation/deformity, R chest permcath Abdomen: soft, NT, no bruising, no hepatosplenomegaly GU: no blood at urethral meatus of penis, no scrotal masses or abnormality, foley with negligible UOP Back: no wounds, no thoracic/lumbar spine tenderness to palpation, no thoracic/lumbar spine stepoffs, unstageable sacral decubitus ulcers Rectal: deferred Extremities: 2+ radial and pedal pulses bilaterally, motor and sensation intact to bilateral UE and LE, + peripheral edema of RLE and b/l UE MSK: unable to assess gait/station, no clubbing/cyanosis of fingers/toes, normal ROM of all four extremities Skin: warm, dry,  no rashes Psych: normal memory, normal mood/affect    Assessment/Plan: 74F with pneumoperitoneum and pneumatosis of distal sigmoid/rectum  To OR emergently for exlap, probable bowel resection and ostomy formation. Rec'd renal-adjusted zosyn. Risks and benefits explained to the patient, including the risk of sepsis post-operatively. All questions answered to his satisfaction. Informed consent signed. Recommend WOC c/s for sacral decubitus. When offering to call family pre or post op, the patient declines. He designates his daughter, Jordan Hawks, as his decision-maker in the event he is unable to make decisions for himself. When asked about code status, he desires maximal resuscitative efforts to be performed, including intubation, defibrillation, chest compressions, and vasopressors/anti-arrhythmics.   Jesusita Oka, MD General and [facility]

## 2020-12-20 NOTE — Progress Notes (Signed)
RT called to PACU.  Patient in weaning mode.  ve 2.1, VT 200s RT placed back in full support Pt will be reevaluated for wean.

## 2020-12-21 ENCOUNTER — Encounter (HOSPITAL_COMMUNITY): Payer: Self-pay | Admitting: Surgery

## 2020-12-21 DIAGNOSIS — K631 Perforation of intestine (nontraumatic): Secondary | ICD-10-CM | POA: Diagnosis not present

## 2020-12-21 DIAGNOSIS — K668 Other specified disorders of peritoneum: Secondary | ICD-10-CM | POA: Diagnosis not present

## 2020-12-21 LAB — CBC
HCT: 26.1 % — ABNORMAL LOW (ref 39.0–52.0)
Hemoglobin: 8 g/dL — ABNORMAL LOW (ref 13.0–17.0)
MCH: 29.2 pg (ref 26.0–34.0)
MCHC: 30.7 g/dL (ref 30.0–36.0)
MCV: 95.3 fL (ref 80.0–100.0)
Platelets: 74 K/uL — ABNORMAL LOW (ref 150–400)
RBC: 2.74 MIL/uL — ABNORMAL LOW (ref 4.22–5.81)
RDW: 20.4 % — ABNORMAL HIGH (ref 11.5–15.5)
WBC: 12.7 K/uL — ABNORMAL HIGH (ref 4.0–10.5)
nRBC: 0 % (ref 0.0–0.2)

## 2020-12-21 LAB — BASIC METABOLIC PANEL
Anion gap: 27 — ABNORMAL HIGH (ref 5–15)
BUN: 85 mg/dL — ABNORMAL HIGH (ref 8–23)
CO2: 12 mmol/L — ABNORMAL LOW (ref 22–32)
Calcium: 5.9 mg/dL — CL (ref 8.9–10.3)
Chloride: 102 mmol/L (ref 98–111)
Creatinine, Ser: 7.26 mg/dL — ABNORMAL HIGH (ref 0.61–1.24)
GFR, Estimated: 8 mL/min — ABNORMAL LOW (ref >60)
Glucose, Bld: 95 mg/dL (ref 70–99)
Potassium: 4.2 mmol/L (ref 3.5–5.1)
Sodium: 141 mmol/L (ref 135–145)

## 2020-12-21 LAB — POCT I-STAT 7, (LYTES, BLD GAS, ICA,H+H)
Acid-base deficit: 13 mmol/L — ABNORMAL HIGH (ref 0.0–2.0)
Bicarbonate: 12.6 mmol/L — ABNORMAL LOW (ref 20.0–28.0)
Calcium, Ion: 0.89 mmol/L — CL (ref 1.15–1.40)
HCT: 31 % — ABNORMAL LOW (ref 39.0–52.0)
Hemoglobin: 10.5 g/dL — ABNORMAL LOW (ref 13.0–17.0)
O2 Saturation: 100 %
Patient temperature: 96
Potassium: 4.5 mmol/L (ref 3.5–5.1)
Sodium: 131 mmol/L — ABNORMAL LOW (ref 135–145)
TCO2: 13 mmol/L — ABNORMAL LOW (ref 22–32)
pCO2 arterial: 26.6 mmHg — ABNORMAL LOW (ref 32.0–48.0)
pH, Arterial: 7.276 — ABNORMAL LOW (ref 7.350–7.450)
pO2, Arterial: 196 mmHg — ABNORMAL HIGH (ref 83.0–108.0)

## 2020-12-21 LAB — GLUCOSE, CAPILLARY
Glucose-Capillary: 102 mg/dL — ABNORMAL HIGH (ref 70–99)
Glucose-Capillary: 105 mg/dL — ABNORMAL HIGH (ref 70–99)
Glucose-Capillary: 131 mg/dL — ABNORMAL HIGH (ref 70–99)
Glucose-Capillary: 68 mg/dL — ABNORMAL LOW (ref 70–99)
Glucose-Capillary: 88 mg/dL (ref 70–99)
Glucose-Capillary: 88 mg/dL (ref 70–99)

## 2020-12-21 LAB — HEMOGLOBIN A1C
Hgb A1c MFr Bld: 4.1 % — ABNORMAL LOW (ref 4.8–5.6)
Mean Plasma Glucose: 70.97 mg/dL

## 2020-12-21 MED ORDER — SODIUM CHLORIDE 0.9% FLUSH
10.0000 mL | INTRAVENOUS | Status: DC | PRN
  Administered 2021-01-10: 10 mL

## 2020-12-21 MED ORDER — LACTATED RINGERS IV BOLUS: 500.0000 mL | Freq: Once | INTRAVENOUS | Status: DC

## 2020-12-21 MED ORDER — SODIUM CHLORIDE 0.9 % IV SOLN
250.0000 mL | INTRAVENOUS | Status: DC
  Administered 2020-12-21: 250 mL via INTRAVENOUS

## 2020-12-21 MED ORDER — SODIUM CHLORIDE 0.9 % IV SOLN
INTRAVENOUS | Status: DC | PRN
  Administered 2020-12-21: 500 mL via INTRAVENOUS

## 2020-12-21 MED ORDER — CALCIUM GLUCONATE-NACL 1-0.675 GM/50ML-% IV SOLN
1.0000 g | Freq: Once | INTRAVENOUS | Status: AC
  Administered 2020-12-21: 1000 mg via INTRAVENOUS
  Filled 2020-12-21: qty 50

## 2020-12-21 MED ORDER — MIDODRINE HCL 5 MG PO TABS
10.0000 mg | ORAL_TABLET | Freq: Three times a day (TID) | ORAL | Status: DC
  Administered 2020-12-21: 10 mg via ORAL
  Filled 2020-12-21: qty 2

## 2020-12-21 MED ORDER — RENA-VITE PO TABS
1.0000 | ORAL_TABLET | Freq: Every day | ORAL | Status: DC
  Administered 2020-12-21 – 2020-12-30 (×10): 1 via ORAL
  Filled 2020-12-21 (×10): qty 1

## 2020-12-21 MED ORDER — PHENYLEPHRINE HCL-NACL 10-0.9 MG/250ML-% IV SOLN
0.0000 ug/min | INTRAVENOUS | Status: DC
  Administered 2020-12-21: 25 ug/min via INTRAVENOUS

## 2020-12-21 MED ORDER — ALBUMIN HUMAN 5 % IV SOLN
25.0000 g | Freq: Once | INTRAVENOUS | Status: DC
  Filled 2020-12-21: qty 500

## 2020-12-21 MED ORDER — AMIODARONE IV BOLUS ONLY 150 MG/100ML
150.0000 mg | Freq: Once | INTRAVENOUS | Status: AC
  Administered 2020-12-21: 150 mg via INTRAVENOUS

## 2020-12-21 MED ORDER — FLUDROCORTISONE ACETATE 0.1 MG PO TABS
0.2000 mg | ORAL_TABLET | Freq: Every day | ORAL | Status: DC
  Filled 2020-12-21: qty 2

## 2020-12-21 MED ORDER — COLLAGENASE 250 UNIT/GM EX OINT
TOPICAL_OINTMENT | Freq: Every day | CUTANEOUS | Status: DC
  Filled 2020-12-21: qty 30

## 2020-12-21 MED ORDER — HEPARIN SODIUM (PORCINE) 1000 UNIT/ML DIALYSIS: 2000.0000 [IU] | INTRAMUSCULAR | Status: DC | PRN

## 2020-12-21 MED ORDER — DAKINS (1/4 STRENGTH) 0.125 % EX SOLN
Freq: Two times a day (BID) | CUTANEOUS | Status: AC
  Filled 2020-12-21 (×2): qty 473

## 2020-12-21 MED ORDER — DEXTROSE 50 % IV SOLN
INTRAVENOUS | Status: AC
  Filled 2020-12-21: qty 50

## 2020-12-21 MED ORDER — CHLORHEXIDINE GLUCONATE CLOTH 2 % EX PADS
6.0000 | MEDICATED_PAD | Freq: Every day | CUTANEOUS | Status: DC
  Administered 2020-12-22 – 2020-12-25 (×3): 6 via TOPICAL

## 2020-12-21 MED ORDER — INSULIN ASPART 100 UNIT/ML ~~LOC~~ SOLN
0.0000 [IU] | Freq: Three times a day (TID) | SUBCUTANEOUS | Status: DC
  Administered 2020-12-21: 2 [IU] via SUBCUTANEOUS
  Administered 2020-12-22: 3 [IU] via SUBCUTANEOUS
  Administered 2020-12-24 – 2020-12-29 (×4): 2 [IU] via SUBCUTANEOUS

## 2020-12-21 MED ORDER — ALBUMIN HUMAN 25 % IV SOLN: 50.0000 g | Freq: Once | INTRAVENOUS | Status: DC

## 2020-12-21 MED ORDER — FAMOTIDINE 20 MG PO TABS
20.0000 mg | ORAL_TABLET | Freq: Every day | ORAL | Status: DC
  Administered 2020-12-21 – 2020-12-24 (×4): 20 mg
  Filled 2020-12-21 (×3): qty 1

## 2020-12-21 MED ORDER — FAMOTIDINE 20 MG PO TABS
20.0000 mg | ORAL_TABLET | Freq: Every day | ORAL | Status: DC
  Filled 2020-12-21: qty 1

## 2020-12-21 MED ORDER — AMIODARONE HCL IN DEXTROSE 360-4.14 MG/200ML-% IV SOLN
30.0000 mg/h | INTRAVENOUS | Status: DC
  Administered 2020-12-21 – 2020-12-22 (×2): 30 mg/h via INTRAVENOUS
  Filled 2020-12-21 (×3): qty 200

## 2020-12-21 MED ORDER — SODIUM CHLORIDE 0.9% FLUSH
10.0000 mL | Freq: Two times a day (BID) | INTRAVENOUS | Status: DC
  Administered 2020-12-21 – 2020-12-24 (×7): 10 mL
  Administered 2020-12-25: 40 mL
  Administered 2020-12-25 – 2020-12-28 (×7): 10 mL

## 2020-12-21 MED ORDER — AMIODARONE HCL IN DEXTROSE 360-4.14 MG/200ML-% IV SOLN
60.0000 mg/h | INTRAVENOUS | Status: AC
  Administered 2020-12-21: 60 mg/h via INTRAVENOUS

## 2020-12-21 MED ORDER — AMIODARONE HCL IN DEXTROSE 360-4.14 MG/200ML-% IV SOLN
INTRAVENOUS | Status: AC
  Filled 2020-12-21: qty 200

## 2020-12-21 MED ORDER — HYDROCORTISONE NA SUCCINATE PF 100 MG IJ SOLR
50.0000 mg | Freq: Four times a day (QID) | INTRAMUSCULAR | Status: DC
  Administered 2020-12-21: 50 mg via INTRAVENOUS
  Filled 2020-12-21 (×2): qty 2

## 2020-12-21 MED ORDER — FLUDROCORTISONE ACETATE 0.1 MG PO TABS
0.2000 mg | ORAL_TABLET | Freq: Every day | ORAL | Status: DC
  Administered 2020-12-21 – 2020-12-24 (×4): 0.2 mg
  Filled 2020-12-21 (×5): qty 2

## 2020-12-21 MED ORDER — INSULIN ASPART 100 UNIT/ML ~~LOC~~ SOLN
0.0000 [IU] | Freq: Every day | SUBCUTANEOUS | Status: DC
  Administered 2020-12-26: 2 [IU] via SUBCUTANEOUS

## 2020-12-21 MED ORDER — PROSOURCE PLUS PO LIQD
30.0000 mL | Freq: Three times a day (TID) | ORAL | Status: DC
  Administered 2020-12-21 – 2020-12-31 (×21): 30 mL via ORAL
  Filled 2020-12-21 (×19): qty 30

## 2020-12-21 MED ORDER — MIDODRINE HCL 5 MG PO TABS
10.0000 mg | ORAL_TABLET | Freq: Three times a day (TID) | ORAL | Status: DC
  Administered 2020-12-21 – 2020-12-24 (×10): 10 mg
  Filled 2020-12-21 (×10): qty 2

## 2020-12-21 MED ORDER — DEXTROSE 50 % IV SOLN
25.0000 mL | Freq: Once | INTRAVENOUS | Status: AC
  Administered 2020-12-21: 25 mL via INTRAVENOUS

## 2020-12-21 MED ORDER — BOOST / RESOURCE BREEZE PO LIQD CUSTOM
1.0000 | Freq: Three times a day (TID) | ORAL | Status: DC
  Administered 2020-12-21 – 2020-12-29 (×22): 1 via ORAL
  Filled 2020-12-21 (×2): qty 1

## 2020-12-21 MED ORDER — ALBUMIN HUMAN 25 % IV SOLN
12.5000 g | Freq: Once | INTRAVENOUS | Status: AC
  Administered 2020-12-21: 12.5 g via INTRAVENOUS
  Filled 2020-12-21: qty 50
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
## 2020-12-21 NOTE — Progress Notes (Signed)
Patient ID: [name], male   DOB: 08/09/1951, 70 y.o.   MRN: [id number]   Acute Care Surgery Service Progress Note:    Chief Complaint/Subjective: +BM overnight Min NG output Just soreness in abd Pressor off this am  Objective: Vital signs in last 24 hours: Temp:  [95.5 F (35.3 C)-97.6 F (36.4 C)] 97.6 F (36.4 C) (01/21 1345) Pulse Rate:  [64-214] 72 (01/21 1400) Resp:  [12-31] 21 (01/21 1400) BP: (65-137)/(44-75) 122/62 (01/21 1400) SpO2:  [91 %-100 %] 97 % (01/21 1400) Arterial Line BP: (86-146)/(44-59) 146/55 (01/21 1300)    Intake/Output from previous day: 01/20 0701 - 01/21 0700 In: 316.8 [I.V.:166.8; IV Piggyback:150] Out: 200 [Emesis/NG output:200] Intake/Output this shift: Total I/O In: 397.9 [I.V.:298.1; IV Piggyback:99.8] Out: -27   Lungs:  nonlabored  Cardiovascular: reg  Abd: soft, some serosang drainage on honeycomb, ND, approp TTP  Extremities: no edema, +SCDs  Neuro: alert, nonfocal  Lab Results: CBC  Recent Labs    12/17/2020 0040 12/13/2020 0648 12/21/20 0523  WBC 15.4*  --  12.7*  HGB 8.8* 10.5* 8.0*  HCT 29.4* 31.0* 26.1*  PLT 93*  --  74*   BMET Recent Labs    12/28/2020 0040 12/11/2020 0648 12/21/20 0523  NA 139 131* 141  K 3.4* 4.5 4.2  CL 113*  --  102  CO2 11*  --  12*  GLUCOSE 66*  --  95  BUN 61*  --  85*  CREATININE 5.27*  --  7.26*  CALCIUM 4.9*  --  5.9*   LFT Hepatic Function Latest Ref Rng & Units 12/18/2020 12/10/2020 12/09/2020  Total Protein 6.5 - 8.1 g/dL 3.6(L) - -  Albumin 3.5 - 5.0 g/dL 1.3(L) 2.1(L) 2.1(L)  AST 15 - 41 U/L 28 - -  ALT 0 - 44 U/L 7 - -  Alk Phosphatase 38 - 126 U/L 68 - -  Total Bilirubin 0.3 - 1.2 mg/dL 0.9 - -  Bilirubin, Direct 0.0 - 0.2 mg/dL - - -   PT/INR No results for input(s): LABPROT, INR in the last 72 hours. ABG Recent Labs    12/12/2020 0648 12/13/2020 0905  PHART 7.276* 7.261*  HCO3 12.6* 13.3*    Studies/Results:  Anti-infectives: Anti-infectives (From admission,  onward)   Start     Dose/Rate Route Frequency Ordered Stop   12/22/2020 1500  piperacillin-tazobactam (ZOSYN) IVPB 3.375 g        3.375 g 12.5 mL/hr over 240 Minutes Intravenous Every 12 hours 12/09/2020 1219     12/04/2020 1200  piperacillin-tazobactam (ZOSYN) IVPB 2.25 g  Status:  Discontinued        2.25 g 100 mL/hr over 30 Minutes Intravenous Every 8 hours 12/29/2020 0205 12/04/2020 1219   12/01/2020 0215  piperacillin-tazobactam (ZOSYN) IVPB 3.375 g        3.375 g 100 mL/hr over 30 Minutes Intravenous  Once 12/24/2020 0205 12/28/2020 0435      Medications: Scheduled Meds: . (feeding supplement) PROSource Plus  30 mL Oral TID with meals  . Chlorhexidine Gluconate Cloth  6 each Topical Daily  . Chlorhexidine Gluconate Cloth  6 each Topical Q0600  . [START ON 12/25/2020] collagenase   Topical Daily  . famotidine  20 mg Per Tube Daily  . feeding supplement  1 Container Oral TID BM  . fludrocortisone  0.2 mg Per Tube Daily  . heparin  5,000 Units Subcutaneous Q8H  . insulin aspart  0-15 Units Subcutaneous TID WC  . insulin aspart  0-5 Units Subcutaneous QHS  . midodrine  10 mg Per Tube TID WC  . multivitamin  1 tablet Oral QHS  . sodium bicarbonate  100 mEq Intravenous Once  . sodium chloride flush  10-40 mL Intracatheter Q12H  . sodium hypochlorite   Irrigation BID   Continuous Infusions: . sodium chloride 10 mL/hr at 12/21/20 1400  . sodium chloride 10 mL/hr at 12/21/20 1400  . amiodarone 30 mg/hr (12/21/20 1400)  . amiodarone    . phenylephrine (NEO-SYNEPHRINE) Adult infusion Stopped (12/21/20 0837)  . piperacillin-tazobactam (ZOSYN)  IV Stopped (12/21/20 1302)   PRN Meds:.sodium chloride, docusate, fentaNYL (SUBLIMAZE) injection, [START ON 12/22/2020] heparin, polyethylene glycol, sodium chloride flush  Assessment/Plan: Patient Active Problem List   Diagnosis Date Noted  . Pneumoperitoneum 12/09/2020  . Perforated sigmoid colon ([place]) 12/27/2020  . Perforated bowel ([place]) 12/06/2020   . Problem with dialysis access ([place])   . Complication of vascular dialysis catheter   . Goals of care, counseling/discussion 11/29/2020  . Abdominal distension   . Colitis   . ESRD on hemodialysis ([place])   . Syncope due to orthostatic hypotension 11/02/2020  . Rectal bleeding   . Acute on chronic anemia   . Diarrhea   . Orthostatic hypotension 10/13/2020  . Chronic diarrhea 10/13/2020  . Colitis presumed infectious 10/13/2020  . Pressure injury of skin 10/13/2020  . ESRD on dialysis ([place]) 10/12/2020  . Unspecified atrial fibrillation ([place]) 10/12/2020  . Syncope 10/12/2020  . Small bowel obstruction ([place])   . Type 2 diabetes mellitus ([place]) 06/11/2018  . Peripheral neuropathy 06/11/2018  . Ankle syndesmosis disruption, right, initial encounter 06/11/2018  . AKI (acute kidney injury) ([place])   . Dehydration   . Open right ankle fracture 06/08/2018  . Fracture, ankle 06/08/2018  . Open displaced fracture of medial malleolus of tibia, type III 06/08/2018   Abd pain with PV gas and pneumatosis and free air, question perf diverticulitis s/p Procedure(s): Negative EXPLORATORY LAPAROTOMY 12/14/2020 Dr Bobbye Morton  Dc ng Clear liquids Pt is usually non-ambulatory  Thrombocytopenia - dropped abruptly since admit - monitor, defer to primary team  ID - zosyn- would probably rec stopping after day 5 - no contamination or perf found during surgery   Disposition:  LOS: 1 day    Leighton Ruff. Redmond Pulling, MD, FACS General, Bariatric, & Minimally Invasive Surgery [phone] [facility]

## 2020-12-21 NOTE — Progress Notes (Addendum)
eLink Physician-Brief Progress Note Patient Name: MAKARI PORTMAN DOB: 1951/05/24 MRN: [id number]   Date of Service  12/21/2020  HPI/Events of Note  H/o PAF on amio Chr hypotension on florinef Now HR 110s-120s, soft BP Npo post op  eICU Interventions  Start amio gtt, no bolus Hold metoprolol Stress dose solucortef      Intervention Category Major Interventions: Arrhythmia - evaluation and management  Maymuna Detzel V. Kristan Brummitt 12/21/2020, 3:43 AM   BP labile during HD.  Albumin given.  phenylephrine ordered.  12/21/2020  5:07 AM   Hypocalcemia -repleted  6:56 AM

## 2020-12-21 NOTE — Anesthesia Postprocedure Evaluation (Signed)
Anesthesia Post Note  Patient: [name]  Procedure(s) Performed: EXPLORATORY LAPAROTOMY (N/A Abdomen)     Patient location during evaluation: PACU Anesthesia Type: General Level of consciousness: sedated Pain management: pain level controlled Vital Signs Assessment: post-procedure vital signs reviewed and stable Respiratory status: nonlabored ventilation, respiratory function stable and patient remains intubated per anesthesia plan Cardiovascular status: stable Postop Assessment: no apparent nausea or vomiting Anesthetic complications: no Comments: Patient hypotensive in PACU isolation bay requiring phenylephrine drip for stabilization.   No complications documented.  Last Vitals:  Vitals:   12/21/20 0842 12/21/20 1200  BP:  116/60  Pulse:  68  Resp:  20  Temp:    SpO2: 96% 97%    Last Pain:  Vitals:   12/21/20 0800  TempSrc: Axillary  PainSc:                  March Rummage Karsen Nakanishi

## 2020-12-21 NOTE — Progress Notes (Signed)
Calcium replacement ordered by Dr. Elsworth Soho and Dr. Royce Macadamia, confirmed with Dr. Royce Macadamia it is appropriate for patient to get total of 2gr Calcium replacement.

## 2020-12-21 NOTE — Consult Note (Signed)
[facility] Nurse Consult Note: Patient receiving care in [facility] [phone] Per TC conversation with Bedside RN Lilia Pro. The wound has not been viewed today but she will assess and get measurements when she completes the Dakin's dressing for today. Dr. Silas Flood will attempt to get one of his colleagues that has the [facility] app to take a photo for the chart.   Reason for Consult: Sacral wound Wound type: Unstageable  Pressure Injury POA: Yes Measurement: See flowsheet Wound bed: 100% black Drainage (amount, consistency, odor)  Dressing procedure/placement/frequency: Order placed for PT to evaluate for hydrotherapy M-Sa. Dakin's 1/4 strength to be applied BID x 3 days.  Starting 12/25/20: Apply Santyl to sacral wound in a nickel thick layer. Cover with a saline moistened gauze, then dry gauze or ABD pad.  Change daily. BEDSIDE RN WILL CHANGE DRESSING ON Sunday while patient is receiving hydrotherapy.  Monitor the wound area(s) for worsening of condition such as: Signs/symptoms of infection, increase in size, development of or worsening of odor, development of pain, or increased pain at the affected locations.   Notify the medical team if any of these develop.  Thank you for the consult. [facility] nurse will  follow weekly while on hydrotherapy Please re-consult the [place] team if needed.  Cathlean Marseilles Tamala Julian, MSN, RN, [place], Lysle Pearl, [facility] Wound Treatment Associate Pager [phone]

## 2020-12-21 NOTE — Progress Notes (Addendum)
Physical Therapy Wound Treatment Patient Details  Name: [name] MRN: [id number] Date of Birth: 1951/08/03  Today's Date: 12/21/2020 Time: 8676-7209 Time Calculation (min): 55 min  Subjective  Subjective: Pleasant and agreeable to wound care.  Reports pain with movement Patient and Family Stated Goals: heal wound Date of Onset:  (unknown - pt reports at least a month) Prior Treatments: unknown  Pain Score:  8/10 pain back and abdomen with rolling but no pain with hydrotherapy; Pt very painful when L ribs touched - provided more support at shoulder and buttock with rolling.   Wound Assessment  Pressure Injury 12/05/20 Coccyx Medial;Upper Unstageable - Full thickness tissue loss in which the base of the injury is covered by slough (yellow, tan, gray, green or brown) and/or eschar (tan, brown or black) in the wound bed. prevous stage 2 on sacrum (Active)  Wound Image   12/21/20 1500  Dressing Type ABD;Barrier Film (skin prep);Other (Dakin's soaked Kling); Medipore 12/21/20 1500  Dressing Clean;Dry;Intact 12/21/20 1500  Dressing Change Frequency Daily 12/21/20 1500  State of Healing Eschar 12/21/20 1500  Site / Wound Assessment Black 12/21/20 1500  % Wound base Red or Granulating 0% 12/21/20 1500  % Wound base Yellow/Fibrinous Exudate 0% 12/21/20 1500  % Wound base Black/Eschar 100% 12/21/20 1500  % Wound base Other/Granulation Tissue (Comment) 0% 12/21/20 1500  Peri-wound Assessment Erythema (blanchable);Purple-small areas immediate periwound; small open areas inferior to wound (2 ~ .1x.1) 12/21/20 1500  Wound Length (cm) 8 cm 12/21/20 1500  Wound Width (cm) 5 cm 12/21/20 1500  Wound Depth (cm) 0.1 cm 12/21/20 1500  Wound Surface Area (cm^2) 40 cm^2 12/21/20 1500  Wound Volume (cm^3) 4 cm^3 12/21/20 1500  Tunneling (cm) 0 12/21/20 1500  Undermining (cm) 0 12/21/20 1500  Margins Unattached edges (unapproximated) 12/21/20 1500  Drainage Amount Minimal 12/21/20 1500  Drainage  Description Serosanguineous 12/21/20 1500  Treatment Debridement (Selective);Hydrotherapy (Pulse lavage);Dakin's soaked gauze 12/21/20 1500      Hydrotherapy Pulsed lavage therapy - wound location: sacrum Pulsed Lavage with Suction (psi): 12 psi Pulsed Lavage with Suction - Normal Saline Used: 1000 mL Pulsed Lavage Tip: Tip with splash shield Selective Debridement Selective Debridement - Location: sacrum Selective Debridement - Tools Used: Scalpel;Scissors;Forceps Selective Debridement - Tissue Removed: Small amount eschar removed with scissor, crosshatched with scapel   Wound Assessment and Plan  Wound Therapy - Assess/Plan/Recommendations Wound Therapy - Clinical Statement: Pt with unstageable pressure ulcer on sacrum.  Wound is 100% soft eschar.  Pt will benefit from hydrotherapy and for selective debridement of unviable tissue in order to decrease bioburden and promote wound bed healing. Wound Therapy - Functional Problem List: global weakness and immobility Factors Delaying/Impairing Wound Healing: Immobility;Multiple medical problems Hydrotherapy Plan: Debridement;Dressing change;Patient/family education;Pulsatile lavage with suction Wound Therapy - Frequency: 6X / week Wound Therapy - Follow Up Recommendations: Skilled nursing facility Wound Plan: see above; and orders for Dakin's solution 3 days then Santyl on 12/25/20  Wound Therapy Goals- Improve the function of patient's integumentary system by progressing the wound(s) through the phases of wound healing (inflammation - proliferation - remodeling) by: Decrease Necrotic Tissue to: 75 Decrease Necrotic Tissue - Progress: Goal set today Increase Granulation Tissue to: 25 Increase Granulation Tissue - Progress: Goal set today Goals/treatment plan/discharge plan were made with and agreed upon by patient/family: Yes Time For Goal Achievement: 7 days Wound Therapy - Potential for Goals: Good  Goals will be updated until maximal  potential achieved or discharge criteria met.  Discharge criteria:  when goals achieved, discharge from hospital, MD decision/surgical intervention, no progress towards goals, refusal/missing three consecutive treatments without notification or medical reason.  GP   Abran Richard, PT Acute Rehab Services Pager [phone] [facility] 12/21/2020, 4:18 PM

## 2020-12-21 NOTE — Progress Notes (Signed)
Initial Nutrition Assessment  DOCUMENTATION CODES:   Underweight  INTERVENTION:   Add Rena-Vite daily  Recommend Regular (liberalized diet) once diet advanced  Add 30 ml ProSource Plus TID, each supplement provides 100 kcals and 15 grams protein.   Add Boost Breeze po TID, each supplement provides 250 kcal and 9 grams of protein, with plans to change to Ensure Enlive, each supplement provides 350 kcal and 20 grams of protein, once diet advanced to at least [place]  Check Vit C, Zinc and Vit A levels; recommend supplementation if deficient  If po intake inadequate on follow-up, may need to consider nutrition support  NUTRITION DIAGNOSIS:   Increased nutrient needs related to wound healing,catabolic illness,chronic illness as evidenced by estimated needs.  GOAL:   Patient will meet greater than or equal to 90% of their needs  MONITOR:   Diet advancement,PO intake,Supplement acceptance,Labs,Weight trends,Skin  REASON FOR ASSESSMENT:   Malnutrition Screening Tool    ASSESSMENT:   70 yo male admitted with with pneumoperitoneum, shock, acute metabolic acidosis, respiratory failure due to volume overload- ESRD/HD (started 10/2020), Pt missed HD x 2 prior to admission.Pt also COVID+.  PMH includes DM, unstageable sacral PI   1/20 Ex lap with pneumoperitoneum and congested rectosigmoid colon  Diet advanced to CL today. Hypoglycemic this AM. No recorded po intake  Pt with unstageable sacral wound, plan for hydrotherapy  Current wt 61.2 kg from 1/19; no new weight. Outpatient EDW not available at present. Pt with bilateral UE edema (moderate). Need to obtain new weight, unsure of dry weight  Labs: CBGs 68-133, albumin 1.3 (1/20), corrected calcium 8.1 Meds: ss novolog, midodrine   Diet Order:   Diet Order            Diet clear liquid Room service appropriate? Yes; Fluid consistency: Thin  Diet effective now                 EDUCATION NEEDS:   Not appropriate for  education at this time  Skin:  Skin Integrity Issues:: Unstageable Unstageable: sacrum  Last BM:  1/21  Height:   Ht Readings from Last 1 Encounters:  12/27/2020 6\' 2"  (1.88 m)    Weight:   Wt Readings from Last 1 Encounters:  12/12/2020 61.2 kg    BMI:  Body mass index is 17.32 kg/m.  Estimated Nutritional Needs:   Kcal:  2000-2200  kcals  Protein:  100-120 g  Fluid:  1000 mL plus UOP   Kerman Passey MS, RDN, LDN, CNSC Registered Dietitian III Clinical Nutrition RD Pager and On-Call Pager Number Located in [place]

## 2020-12-21 NOTE — Progress Notes (Signed)
NAME:  [name], MRN:  [id number], DOB:  [birth year], LOS: 1 ADMISSION DATE:  12/13/2020, CONSULTATION DATE: 12/16/2020 REFERRING MD:  Jesusita Oka, MD, CHIEF COMPLAINT: Acute hypoxic respiratory failure post laparotomy  Brief History:  69 year old male with end-stage renal disease on dialysis TTS, missed HD session on Tuesday, coming in with nausea vomiting abdominal pain, noted to have pneumoperitoneum.  Underwent laparotomy.  COVID-positive  History of Present Illness:  70 year old male with diabetes and end stage renal disease on HD, TTS, his last dialysis was last Saturday, missed session on Tuesday, presented with nausea vomiting and abdominal pain for 2 days.  In the ED work-up showed pneumoperitoneum/perforation of the distal colon with transition point, patient was seen by general surgery and underwent emergent laparotomy.  Post surgery patient was brought to the PACU, he was noted to be COVID-positive.  He was transferred to ICU for further care.  Past Medical History:  End-stage renal disease on dialysis Diabetes type 2 Paroxysmal A. fib Orthostatic hypotension Unstageable sacral decubitus ulcer (POA)  Significant Hospital Events:   1/21 iHD, low dose phenyl needed, weaned off after iHD stopped, started on stress dose steroids - got 1 dose and was stopped, resumed home midodrine  Consults:  General surgery  Procedures:   1/20  exploratory laparotomy ETT 1/20>  Significant Diagnostic Tests:  1/20 CT abdomen and pelvis: There is a focal area of wall thickening with adjacent perforation of the distal descending colon at the junction of the sigmoid colon, likely the transition point. 2. There is moderately dilated sigmoid colon and rectum which is stool-filled and suggestion of possible pneumatosis. 3. Free air seen under the right hemidiaphragm and within the porta hepatis. 4. Small amount of air seen within the inferior right liver lobe which is concerning for  portal venous gas given the patient's findings 5. Bilateral sacral decubitus ulceration with phlegmon and subcutaneous emphysema extending to the inferior coccyx.  Micro Data:  1/20 influenza PCR negative 1/20 COVID PCR positive  Antimicrobials:  Zosyn 1/19 >  Interim History / Subjective:    Objective   Blood pressure (!) 96/58, pulse (!) 203, temperature (!) 96.9 F (36.1 C), temperature source Axillary, resp. rate 18, height 6\' 2"  (1.88 m), weight 61.2 kg, SpO2 96 %.        Intake/Output Summary (Last 24 hours) at 12/21/2020 1033 Last data filed at 12/21/2020 0800 Gross per 24 hour  Intake 316.84 ml  Output 173 ml  Net 143.84 ml   Filed Weights   12/03/2020 2141  Weight: 61.2 kg    Examination:   Physical exam: General: Chronically ill-appearing male, on [place] HEENT: /AT, eyes anicte Neuro: awake, alert Chest: Coarse breath sounds, no wheezes or rhonchi Heart: Regular rate and rhythm, no murmurs or gallops Abdomen: Soft, surgical scar noted midline, well dressed, no discharge, bowel sounds sluggish Skin: Large decubitus ulcer noted at sacrum, unstageable   Resolved Hospital Problem list     Assessment & Plan:  Distal colonic perforation with pneumoperitoneum Large bowel obstruction status post exploratory laparotomy --General surgery consult is appreciated, patient underwent ex laparotomy --Continue Zosyn  Hypotension: Likely hypovolemic in setting of NPO, has chronic low Bps, last discharged SBP 80s. --off phenyl (only needed for iHD) --resume home midodrine, resume home florinef --Received solucortef 50 mcg x 1, d/c'd this morning --IVF as needed --Resume diet  Acute metabolic acidosis: due to missed HD, starvation. --iHD 1/21 AM --Diet  Acute hypoxic respiratory failure due to volume overload  from missing HD session: Improving after dialysois --SPO2 > 88  Diabetes type 2: --BG ok, not requiring SSI  Best practice (evaluated daily)  Diet:  NPO Pain/Anxiety/Delirium protocol (if indicated): n/a VAP protocol (if indicated): Ordered DVT prophylaxis: Subcu heparin GI prophylaxis: Famotidine (home med) Glucose control: SSI Mobility: PT/OT Disposition: ICU  Goals of Care:   Code Status: Full code  Labs   CBC: Recent Labs  Lab 12/18/20 1145 12/06/2020 0040 12/29/2020 0648 12/21/20 0523  WBC 7.2 15.4*  --  12.7*  NEUTROABS 5.8 13.4*  --   --   HGB 8.8* 8.8* 10.5* 8.0*  HCT 30.0* 29.4* 31.0* 26.1*  MCV 101.7* 97.7  --  95.3  PLT 120* 93*  --  74*    Basic Metabolic Panel: Recent Labs  Lab 12/18/20 1145 12/29/2020 0040 12/21/2020 0648 12/21/20 0523  NA 136 139 131* 141  K 4.0 3.4* 4.5 4.2  CL 102 113*  --  102  CO2 18* 11*  --  12*  GLUCOSE 73 66*  --  95  BUN 54* 61*  --  85*  CREATININE 5.83* 5.27*  --  7.26*  CALCIUM 7.1* 4.9*  --  5.9*   GFR: Estimated Creatinine Clearance: 8.2 mL/min (A) (by C-G formula based on SCr of 7.26 mg/dL (H)). Recent Labs  Lab 12/18/20 1145 12/14/2020 0040 12/12/2020 1058 12/21/20 0523  WBC 7.2 15.4*  --  12.7*  LATICACIDVEN  --   --  1.8  --     Liver Function Tests: Recent Labs  Lab 12/03/2020 0040  AST 28  ALT 7  ALKPHOS 68  BILITOT 0.9  PROT 3.6*  ALBUMIN 1.3*   Recent Labs  Lab 12/02/2020 0040  LIPASE 14   No results for input(s): AMMONIA in the last 168 hours.  ABG    Component Value Date/Time   PHART 7.261 (L) 12/10/2020 0905   PCO2ART 30.1 (L) 12/25/2020 0905   PO2ART 149 (H) 12/15/2020 0905   HCO3 13.3 (L) 12/10/2020 0905   TCO2 13 (L) 12/18/2020 0648   ACIDBASEDEF 12.6 (H) 12/01/2020 0905   O2SAT 97.0 12/12/2020 0905     Coagulation Profile: No results for input(s): INR, PROTIME in the last 168 hours.  Cardiac Enzymes: No results for input(s): CKTOTAL, CKMB, CKMBINDEX, TROPONINI in the last 168 hours.  HbA1C: Hgb A1c MFr Bld  Date/Time Value Ref Range Status  06/13/2018 03:16 AM 6.6 (H) 4.8 - 5.6 % Final    Comment:    (NOTE) Pre diabetes:           5.7%-6.4% Diabetes:              >6.4% Glycemic control for   <7.0% adults with diabetes     CBG: Recent Labs  Lab 12/24/2020 1133 12/17/2020 2016 12/21/20 0040 12/21/20 0312 12/21/20 0738  GLUCAP 108* 104* 102* 88 68*    Critical Care Time:  Total critical care time: 33 minutes  Performed by: Lanier Clam, MD   Critical care time was exclusive of separately billable procedures and treating other patients.   Critical care was necessary to treat or prevent imminent or life-threatening deterioration.   Critical care was time spent personally by me on the following activities: development of treatment plan with patient and/or surrogate as well as nursing, discussions with consultants, evaluation of patient's response to treatment, examination of patient, obtaining history from patient or surrogate, ordering and performing treatments and interventions, ordering and review of laboratory studies, ordering and review  of radiographic studies, pulse oximetry and re-evaluation of patient's condition.

## 2020-12-22 DIAGNOSIS — I480 Paroxysmal atrial fibrillation: Secondary | ICD-10-CM

## 2020-12-22 DIAGNOSIS — K668 Other specified disorders of peritoneum: Secondary | ICD-10-CM | POA: Diagnosis not present

## 2020-12-22 DIAGNOSIS — N186 End stage renal disease: Secondary | ICD-10-CM | POA: Diagnosis not present

## 2020-12-22 LAB — GLUCOSE, CAPILLARY
Glucose-Capillary: 85 mg/dL (ref 70–99)
Glucose-Capillary: 115 mg/dL — ABNORMAL HIGH (ref 70–99)
Glucose-Capillary: 164 mg/dL — ABNORMAL HIGH (ref 70–99)
Glucose-Capillary: 183 mg/dL — ABNORMAL HIGH (ref 70–99)

## 2020-12-22 LAB — MRSA PCR SCREENING: MRSA by PCR: POSITIVE — AB

## 2020-12-22 MED ORDER — AMIODARONE HCL 200 MG PO TABS
200.0000 mg | ORAL_TABLET | Freq: Every day | ORAL | Status: DC
  Administered 2020-12-22 – 2020-12-23 (×2): 200 mg via ORAL
  Filled 2020-12-22 (×3): qty 1

## 2020-12-22 MED ORDER — PSYLLIUM 95 % PO PACK
1.0000 | PACK | Freq: Every day | ORAL | Status: DC
  Administered 2020-12-22 – 2020-12-25 (×4): 1 via ORAL
  Filled 2020-12-22 (×6): qty 1

## 2020-12-22 MED ORDER — MUPIROCIN 2 % EX OINT
TOPICAL_OINTMENT | Freq: Two times a day (BID) | CUTANEOUS | Status: DC
  Administered 2020-12-31 – 2021-01-02 (×2): 1 via NASAL
  Filled 2020-12-22 (×6): qty 22

## 2020-12-22 NOTE — Progress Notes (Signed)
IV team called this am d/t 1 IV access and pt very edematous arms. Could not find access. IV team could not find access either. Pt's creatinine too high for PICC line. Dr. Sloan Leiter made aware. No firther orders for central line or otherwise at this time. Pt has one 22 G IV. Amiodarone gtt changed to PO.  Lab unable to draw pt labs today ( Have attempted 3 times) d/t trouble accessing. Dr. Sloan Leiter aware and will try for labs again tomorrow while pt in dialysis.

## 2020-12-22 NOTE — Progress Notes (Signed)
A consult was placed to IV Therapy to place a 2nd peripheral iv;  Current site is near the pt's left thumb area with Amiodarone infusing along with other meds; both arms assessed w ultrasound; severe pitting edema noted, along with weeping from both arms; no suitable veins noted; CrCl is 9; pt is still dialyzed, and may go today; lab is unable to draw blood work;  Therapist, sports and MD aware;  No attempts made at placing a 2nd site;  Current site wnl.  Raynelle Fanning RN IV Team
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
## 2020-12-22 NOTE — Progress Notes (Signed)
[facility] Progress Note  2 Days Post-Op  Subjective: CC-  Comfortable this morning. Denies abdominal pain. States that he is tolerating clear liquids and feels hungry. Denies n/v. Passing flatus and having bowel movements. States that he has had several loose stools, this is chronic.  Afebrile  Objective: Vital signs in last 24 hours: Temp:  [97.6 F (36.4 C)-98.9 F (37.2 C)] 98.3 F (36.8 C) (01/22 0857) Pulse Rate:  [62-115] 82 (01/22 0857) Resp:  [14-31] 17 (01/22 0857) BP: (94-134)/(52-105) 130/77 (01/22 0857) SpO2:  [93 %-100 %] 98 % (01/22 0857) Arterial Line BP: (122-146)/(47-59) 146/55 (01/21 1300) Weight:  [69.2 kg] 69.2 kg (01/22 0400) Last BM Date: 12/21/20  Intake/Output from previous day: 01/21 0701 - 01/22 0700 In: 632.9 [P.O.:75; I.V.:408.1; IV Piggyback:149.8] Out: -27  Intake/Output this shift: No intake/output data recorded.  PE: Gen:  Alert, NAD, pleasant HEENT: EOM's intact, pupils equal and round Pulm:  rate and effort normal Abd: Soft, appropriately tender, +BS, midline incision cdi with staples intact and no erythema or drainage Skin: warm and dry  Lab Results:  Recent Labs    12/04/2020 0040 12/22/2020 0648 12/21/20 0523  WBC 15.4*  --  12.7*  HGB 8.8* 10.5* 8.0*  HCT 29.4* 31.0* 26.1*  PLT 93*  --  74*   BMET Recent Labs    12/08/2020 0040 12/17/2020 0648 12/21/20 0523  NA 139 131* 141  K 3.4* 4.5 4.2  CL 113*  --  102  CO2 11*  --  12*  GLUCOSE 66*  --  95  BUN 61*  --  85*  CREATININE 5.27*  --  7.26*  CALCIUM 4.9*  --  5.9*   PT/INR No results for input(s): LABPROT, INR in the last 72 hours. CMP     Component Value Date/Time   NA 141 12/21/2020 0523   K 4.2 12/21/2020 0523   CL 102 12/21/2020 0523   CO2 12 (L) 12/21/2020 0523   GLUCOSE 95 12/21/2020 0523   BUN 85 (H) 12/21/2020 0523   CREATININE 7.26 (H) 12/21/2020 0523   CALCIUM 5.9 (LL) 12/21/2020 0523   PROT 3.6 (L) 12/01/2020 0040   ALBUMIN 1.3 (L)  12/25/2020 0040   AST 28 12/15/2020 0040   ALT 7 12/06/2020 0040   ALKPHOS 68 12/29/2020 0040   BILITOT 0.9 12/15/2020 0040   GFRNONAA 8 (L) 12/21/2020 0523   GFRAA 19 (L) 06/15/2018 0407   Lipase     Component Value Date/Time   LIPASE 14 12/04/2020 0040       Studies/Results: No results found.  Anti-infectives: Anti-infectives (From admission, onward)   Start     Dose/Rate Route Frequency Ordered Stop   12/19/2020 1500  piperacillin-tazobactam (ZOSYN) IVPB 3.375 g        3.375 g 12.5 mL/hr over 240 Minutes Intravenous Every 12 hours 12/28/2020 1219     12/31/2020 1200  piperacillin-tazobactam (ZOSYN) IVPB 2.25 g  Status:  Discontinued        2.25 g 100 mL/hr over 30 Minutes Intravenous Every 8 hours 12/08/2020 0205 12/16/2020 1219   12/11/2020 0215  piperacillin-tazobactam (ZOSYN) IVPB 3.375 g        3.375 g 100 mL/hr over 30 Minutes Intravenous  Once 12/31/2020 0205 12/22/2020 0435       Assessment/Plan ESRD - HD TTS DM2 Paroxysmal A fib Orthostatic hypotension Chronic diarrhea - extensive work up by GI fairly negative. colonoscopy 11/21/20 only revealed large ulceration involving distal sigmoid colon, path and GI  panel negative at the time   Pneumoperitoneum, pneumatosis, portal venous gas S/p negative exploratory laparotomy 1/20 Dr. Bobbye Morton - POD#2 - intraop: no acute findings, no contamination, excellent blood flow to intestine, congested rectosigmoid colon - Labs pending. Tolerating clears and having bowel function. Advance to full liquids, Boost. Add fiber for diarrhea.  ID - zosyn 1/20>>day#3/5 FEN - IVF, FLD, Boost VTE - SCDs, sq heparin Foley - none    LOS: 2 days    Wellington Hampshire, [facility] 12/22/2020, 9:12 AM Please see Amion for pager number during day hours 7:00am-4:30pm

## 2020-12-22 NOTE — Progress Notes (Signed)
Physical Therapy Wound Treatment Patient Details  Name: [name] MRN: [id number] Date of Birth: [birth year]  Today's Date: 12/22/2020 Time: 6712-4580 Time Calculation (min): 35 min  Subjective  Subjective: Pleasant and agreeable to wound care.  Pain better controlled this session. Patient and Family Stated Goals: heal wound Date of Onset:  (unknown - pt reports at least a month) Prior Treatments: unknown  Pain Score: Pain Score: 2   Wound Assessment  Pressure Injury 12/05/20 Coccyx Medial;Upper Unstageable - Full thickness tissue loss in which the base of the injury is covered by slough (yellow, tan, gray, green or brown) and/or eschar (tan, brown or black) in the wound bed. prevous stage 2 on sacrum (Active)  Dressing Type ABD;Other (Comment);Tape dressing;Barrier Film (skin prep) 12/22/20 1300  Dressing Changed 12/22/20 1300  Dressing Change Frequency Twice a day 12/22/20 1300  State of Healing Eschar 12/22/20 1300  Site / Wound Assessment Black;Brown 12/22/20 1300  % Wound base Red or Granulating 0% 12/22/20 1300  % Wound base Yellow/Fibrinous Exudate 0% 12/22/20 1300  % Wound base Black/Eschar 100% 12/22/20 1300  % Wound base Other/Granulation Tissue (Comment) 0% 12/21/20 1500  Peri-wound Assessment Erythema (blanchable) 12/22/20 1300  Wound Length (cm) 8 cm 12/21/20 1500  Wound Width (cm) 5 cm 12/21/20 1500  Wound Depth (cm) 0.1 cm 12/21/20 1500  Wound Surface Area (cm^2) 40 cm^2 12/21/20 1500  Wound Volume (cm^3) 4 cm^3 12/21/20 1500  Tunneling (cm) 0 12/21/20 1500  Undermining (cm) 0 12/21/20 1500  Margins Unattached edges (unapproximated) 12/22/20 1300  Drainage Amount Minimal 12/22/20 1300  Drainage Description Odor;Other (Comment) 12/22/20 1300  Treatment Debridement (Selective);Hydrotherapy (Pulse lavage);Other (Comment) 12/22/20 1300  dakins soaked cling to pack wound.  Hydrotherapy Pulsed lavage therapy - wound location: sacrum Pulsed Lavage with Suction (psi):  12 psi Pulsed Lavage with Suction - Normal Saline Used: 1000 mL Pulsed Lavage Tip: Tip with splash shield Selective Debridement Selective Debridement - Location: sacrum Selective Debridement - Tools Used: Scalpel;Scissors;Forceps Selective Debridement - Tissue Removed: Able to removed large eschar to open wound and sloughly dark brown non-viable tissue from wound bed.   Wound Assessment and Plan  Wound Therapy - Assess/Plan/Recommendations Wound Therapy - Clinical Statement: Able to remove eschar to open wound and debride slough from wound bed.  Pt will benefit from hydrotherapy and for selective debridement of unviable tissue in order to decrease bioburden and promote wound bed healing. Wound Therapy - Functional Problem List: global weakness and immobility Factors Delaying/Impairing Wound Healing: Immobility;Multiple medical problems Hydrotherapy Plan: Debridement;Dressing change;Patient/family education;Pulsatile lavage with suction Wound Therapy - Frequency: 6X / week Wound Therapy - Follow Up Recommendations: Skilled nursing facility Wound Plan: see above; and orders for [facility] solution until 1/25 then Santyl  Wound Therapy Goals- Improve the function of patient's integumentary system by progressing the wound(s) through the phases of wound healing (inflammation - proliferation - remodeling) by: Decrease Necrotic Tissue to: 75 Decrease Necrotic Tissue - Progress: Progressing toward goal Increase Granulation Tissue to: 25 Increase Granulation Tissue - Progress: Progressing toward goal Goals/treatment plan/discharge plan were made with and agreed upon by patient/family: Yes Time For Goal Achievement: 7 days Wound Therapy - Potential for Goals: Good  Goals will be updated until maximal potential achieved or discharge criteria met.  Discharge criteria: when goals achieved, discharge from hospital, MD decision/surgical intervention, no progress towards goals, refusal/missing three  consecutive treatments without notification or medical reason.  GP     Emrie Gayle Eli Hose 12/22/2020, 1:07 PM Neftali Abair R. ,  PTA Acute Rehabilitation Services Pager [phone] Office [phone]    

## 2020-12-22 NOTE — Progress Notes (Signed)
PROGRESS NOTE                                                                                                                                                                                                             Patient Demographics:    [name], is a 70 y.o. male, DOB - [birth year], CHE:[id number]  Outpatient Primary MD for the patient is Patient, No Pcp Per   Admit date - 12/30/2020   LOS - 2  Chief Complaint  Patient presents with  . Vomiting       Brief Narrative: Patient is a 70 y.o. male with PMHx of ESRD, chronic diarrhea (with extensive work-up in the past), orthostatic hypotension, PAF-anticoagulation on hold due to history of GI bleeding, -presented to the hospital from SNF due to abdominal pain, nausea and vomiting-found to have pneumoperitoneum on imaging studies-underwent exploratory laparotomy without any findings of bowel perforation.  He was also found to be COVID-positive.  He was managed in the [facility] stability-transferred to the [facility].  COVID-19 vaccinated status:   Significant Events: 1/19>> Admit to [facility] for abdominal pain-pneumoperitoneum on imaging studies-exploratory laparotomy negative 1/22>> transferred to [facility] service.  Significant studies: 1/20>> CT abdomen/pelvis: Focal area of wall thickening within the adjacent perforation of the distal descending colon, free air under the right diaphragm 1/20>> chest x-ray: Hazy density at the bases from small pleural effusions by prior CT.  COVID-19 medications: None  Antibiotics: Zosyn: 1/19>>  Microbiology data: None  Procedures: Exploratory laparotomy 1/20 ETT 1/20>>1/20  Consults: CCS, PCCM, nephrology  DVT prophylaxis: heparin injection 5,000 Units Start: 12/28/2020 0700 SCDs Start: 12/28/2020 5361    Subjective:    [name] today denies any major issues-has some pain at the operative site.  Lying  comfortably in bed   Assessment  & Plan :   Abdominal pain with nausea/vomiting: Initial concern for perforated viscus-however no evidence of perforated viscus on exploratory laparotomy.  Diet being slowly advanced.  General surgery following-on Zosyn x5 days.  Hypovolemic shock: Resolved-briefly required pressors in the ICU.  Home dose midodrine and Florinef resumed.  Acute hypoxic respiratory failure due to volume overload from missing HD: Currently stable on minimal oxygen.  ESRD: On HD TTS-nephrology following.  Chronic diarrhea: Has had extensive work-up in the past-negative-supportive care.  Orthostatic hypotension: Chronic issue-on midodrine/Florinef.  Anemia:  Secondary to CKD and acute illness-transfuse if less than 7.  PAF: Resume oral amiodarone-stop IV amiodarone.  Anticoagulation has been on hold for several months due to a recent episode of GI bleeding.  I reviewed most recent discharge summary from [facility]  Chronic debility/deconditioning: At the SNF for the past few months-extremely weak-not sure if he is ambulatory at this point.  COVID-19 infection: Incidental finding-supportive care at this point.   Fever: afebrile O2 requirements:  SpO2: 98 % O2 Flow Rate (L/min): 2 L/min FiO2 (%): 40 %   COVID-19 Labs: No results for input(s): DDIMER, FERRITIN, LDH, CRP in the last 72 hours.  No results found for: BNP  No results for input(s): PROCALCITON in the last 168 hours.  Lab Results  Component Value Date   [place] (A) 12/15/2020   [place] NEGATIVE 12/06/2020   [place] NEGATIVE 11/22/2020   [place] NEGATIVE 11/11/2020     Nutrition Problem: Nutrition Problem: Increased nutrient needs Etiology: wound healing,catabolic illness,chronic illness Signs/Symptoms: estimated needs Interventions: MVI,Boost Melene Muller (each supplement provides 350kcal and 20 grams of protein),Refer to RD note for  recommendations,Liberalize Diet   RN pressure injury documentation: Pressure Injury 12/05/20 Coccyx Medial;Upper Unstageable - Full thickness tissue loss in which the base of the injury is covered by slough (yellow, tan, gray, green or brown) and/or eschar (tan, brown or black) in the wound bed. prevous stage 2 on sacrum (Active)  12/05/20 1000  Location: Coccyx  Location Orientation: Medial;Upper  Staging: Unstageable - Full thickness tissue loss in which the base of the injury is covered by slough (yellow, tan, gray, green or brown) and/or eschar (tan, brown or black) in the wound bed.  Wound Description (Comments): prevous stage 2 on sacrum, now evolved to unstageable. Black eschar covering wound bed  Present on Admission: No    ABG:    Component Value Date/Time   PHART 7.261 (L) 12/09/2020 0905   PCO2ART 30.1 (L) 12/01/2020 0905   PO2ART 149 (H) 12/12/2020 0905   HCO3 13.3 (L) 12/23/2020 0905   TCO2 13 (L) 12/12/2020 0648   ACIDBASEDEF 12.6 (H) 12/14/2020 0905   O2SAT 97.0 12/29/2020 0905    Vent Settings: N/A   Condition - Stable  Family Communication  :   Code Status :  Full Code  Diet :  Diet Order            Diet full liquid Room service appropriate? Yes; Fluid consistency: Thin  Diet effective now                  Disposition Plan  :   Status is: Inpatient  Remains inpatient appropriate because:Inpatient level of care appropriate due to severity of illness   Dispo: The patient is from: SNF              Anticipated d/c is to: SNF              Anticipated d/c date is: > 3 days              Patient currently is not medically stable to d/c.   Difficult to place patient No     Barriers to discharge: Hypoxia requiring O2 supplementation/complete 5 days of IV Remdesivir  Antimicorbials  :    Anti-infectives (From admission, onward)   Start     Dose/Rate Route Frequency Ordered Stop   12/18/2020 1500  piperacillin-tazobactam (ZOSYN) IVPB 3.375 g         3.375 g 12.5  mL/hr over 240 Minutes Intravenous Every 12 hours 12/29/2020 1219     12/13/2020 1200  piperacillin-tazobactam (ZOSYN) IVPB 2.25 g  Status:  Discontinued        2.25 g 100 mL/hr over 30 Minutes Intravenous Every 8 hours 12/07/2020 0205 12/28/2020 1219   12/10/2020 0215  piperacillin-tazobactam (ZOSYN) IVPB 3.375 g        3.375 g 100 mL/hr over 30 Minutes Intravenous  Once 12/06/2020 0205 12/31/2020 0435      Inpatient Medications  Scheduled Meds: . (feeding supplement) PROSource Plus  30 mL Oral TID with meals  . amiodarone  200 mg Oral Daily  . Chlorhexidine Gluconate Cloth  6 each Topical Daily  . Chlorhexidine Gluconate Cloth  6 each Topical Q0600  . [START ON 12/25/2020] collagenase   Topical Daily  . famotidine  20 mg Per Tube Daily  . feeding supplement  1 Container Oral TID BM  . fludrocortisone  0.2 mg Per Tube Daily  . heparin  5,000 Units Subcutaneous Q8H  . insulin aspart  0-15 Units Subcutaneous TID WC  . insulin aspart  0-5 Units Subcutaneous QHS  . midodrine  10 mg Per Tube TID WC  . multivitamin  1 tablet Oral QHS  . mupirocin ointment   Nasal BID  . psyllium  1 packet Oral Daily  . sodium bicarbonate  100 mEq Intravenous Once  . sodium chloride flush  10-40 mL Intracatheter Q12H  . sodium hypochlorite   Irrigation BID   Continuous Infusions: . sodium chloride 10 mL/hr at 12/21/20 1700  . sodium chloride 10 mL/hr at 12/21/20 1700  . piperacillin-tazobactam (ZOSYN)  IV 3.375 g (12/22/20 0238)   PRN Meds:.sodium chloride, docusate, fentaNYL (SUBLIMAZE) injection, heparin, polyethylene glycol, sodium chloride flush   Time Spent in minutes  25  See all Orders from today for further details   Oren Binet M.D on 12/22/2020 at 11:38 AM  To page go to [email or web address] - use universal password  [facility] -  Office  [phone]    Objective:   Vitals:   12/21/20 2019 12/22/20 0003 12/22/20 0400 12/22/20 0857  BP: 134/63 94/63 111/69 130/77  Pulse:  68 65 66 82  Resp: 17 17 15 17   Temp: 98.1 F (36.7 C) 98.9 F (37.2 C) 98.6 F (37 C) 98.3 F (36.8 C)  TempSrc: Oral Axillary Axillary Oral  SpO2: 100% 94% 97% 98%  Weight:   69.2 kg   Height:   6\' 2"  (1.88 m)     Wt Readings from Last 3 Encounters:  12/22/20 69.2 kg  12/18/20 61.2 kg  12/10/20 66.3 kg     Intake/Output Summary (Last 24 hours) at 12/22/2020 1138 Last data filed at 12/21/2020 2200 Gross per 24 hour  Intake 349.96 ml  Output --  Net 349.96 ml     Physical Exam Gen Exam:Alert awake-not in any distress HEENT:atraumatic, normocephalic Chest: B/L clear to auscultation anteriorly CVS:S1S2 regular Abdomen:soft non tender, non distended Neurology: Non focal-but with severe generalized weakness. Skin: no rash   Data Review:    CBC Recent Labs  Lab 12/18/20 1145 12/19/2020 0040 12/18/2020 0648 12/21/20 0523  WBC 7.2 15.4*  --  12.7*  HGB 8.8* 8.8* 10.5* 8.0*  HCT 30.0* 29.4* 31.0* 26.1*  PLT 120* 93*  --  74*  MCV 101.7* 97.7  --  95.3  MCH 29.8 29.2  --  29.2  MCHC 29.3* 29.9*  --  30.7  RDW 21.5* 20.5*  --  20.4*  LYMPHSABS 0.7 0.8  --   --   MONOABS 0.7 1.1*  --   --   EOSABS 0.0 0.0  --   --   BASOSABS 0.0 0.0  --   --     Chemistries  Recent Labs  Lab 12/18/20 1145 12/26/2020 0040 12/08/2020 0648 12/21/20 0523  NA 136 139 131* 141  K 4.0 3.4* 4.5 4.2  CL 102 113*  --  102  CO2 18* 11*  --  12*  GLUCOSE 73 66*  --  95  BUN 54* 61*  --  85*  CREATININE 5.83* 5.27*  --  7.26*  CALCIUM 7.1* 4.9*  --  5.9*  AST  --  28  --   --   ALT  --  7  --   --   ALKPHOS  --  68  --   --   BILITOT  --  0.9  --   --    ------------------------------------------------------------------------------------------------------------------ No results for input(s): CHOL, HDL, LDLCALC, TRIG, CHOLHDL, LDLDIRECT in the last 72 hours.  Lab Results  Component Value Date   HGBA1C 4.1 (L) 12/21/2020    ------------------------------------------------------------------------------------------------------------------ No results for input(s): TSH, T4TOTAL, T3FREE, THYROIDAB in the last 72 hours.  Invalid input(s): FREET3 ------------------------------------------------------------------------------------------------------------------ No results for input(s): VITAMINB12, FOLATE, FERRITIN, TIBC, IRON, RETICCTPCT in the last 72 hours.  Coagulation profile No results for input(s): INR, PROTIME in the last 168 hours.  No results for input(s): DDIMER in the last 72 hours.  Cardiac Enzymes No results for input(s): CKMB, TROPONINI, MYOGLOBIN in the last 168 hours.  Invalid input(s): CK ------------------------------------------------------------------------------------------------------------------ No results found for: BNP  Micro Results Recent Results (from the past 240 hour(s))  Resp Panel by RT-PCR (Flu A&B, Covid) Nasopharyngeal Swab     Status: Abnormal   Collection Time: 12/25/2020  2:50 AM   Specimen: Nasopharyngeal Swab; Nasopharyngeal(NP) swabs in vial transport medium  Result Value Ref Range Status   SARS Coronavirus 2 by RT PCR POSITIVE (A) NEGATIVE Final    Comment: RESULT CALLED TO, READ BACK BY AND VERIFIED WITH: C YELVERTON RN 12/19/2020 0349 JDW (NOTE) SARS-CoV-2 target nucleic acids are DETECTED.  The SARS-CoV-2 RNA is generally detectable in upper respiratory specimens during the acute phase of infection. Positive results are indicative of the presence of the identified virus, but do not rule out bacterial infection or co-infection with other pathogens not detected by the test. Clinical correlation with patient history and other diagnostic information is necessary to determine patient infection status. The expected result is Negative.  Fact Sheet for Patients: EntrepreneurPulse.com.au  Fact Sheet for Healthcare  Providers: IncredibleEmployment.be  This test is not yet approved or cleared by the [place] FDA and  has been authorized for detection and/or diagnosis of SARS-CoV-2 by FDA under an Emergency Use Authorization (EUA).  This EUA will remain in effect (meaning this test can be u sed) for the duration of  the COVID-19 declaration under Section 564(b)(1) of the Act, 21 U.S.C. section 360bbb-3(b)(1), unless the authorization is terminated or revoked sooner.     Influenza A by PCR NEGATIVE NEGATIVE Final   Influenza B by PCR NEGATIVE NEGATIVE Final    Comment: (NOTE) The Xpert Xpress SARS-CoV-2/FLU/RSV plus assay is intended as an aid in the diagnosis of influenza from Nasopharyngeal swab specimens and should not be used as a sole basis for treatment. Nasal washings and aspirates are unacceptable for Xpert Xpress SARS-CoV-2/FLU/RSV testing.  Fact Sheet for Patients: EntrepreneurPulse.com.au  Fact Sheet  for Healthcare Providers: IncredibleEmployment.be  This test is not yet approved or cleared by the [place] and has been authorized for detection and/or diagnosis of SARS-CoV-2 by FDA under an Emergency Use Authorization (EUA). This EUA will remain in effect (meaning this test can be used) for the duration of the COVID-19 declaration under Section 564(b)(1) of the Act, 21 U.S.C. section 360bbb-3(b)(1), unless the authorization is terminated or revoked.  Performed at [facility], [place] [address], [place], [place] [address]   MRSA PCR Screening     Status: Abnormal   Collection Time: 12/21/20 11:39 PM  Result Value Ref Range Status   MRSA by PCR POSITIVE (A) NEGATIVE Final    Comment:        The GeneXpert MRSA Assay (FDA approved for NASAL specimens only), is one component of a comprehensive MRSA colonization surveillance program. It is not intended to diagnose MRSA infection nor to guide or monitor  treatment for MRSA infections. RESULT CALLED TO, READ BACK BY AND VERIFIED WITH: D TUCK RN 12/22/20 0134 JDW Performed at [facility], [address] [place], [place] [address]     Radiology Reports CT Abdomen Pelvis Wo Contrast  Result Date: 12/26/2020 CLINICAL DATA:  Abdominal pain EXAM: CT ABDOMEN AND PELVIS WITHOUT CONTRAST TECHNIQUE: Multidetector CT imaging of the abdomen and pelvis was performed following the standard protocol without IV contrast. COMPARISON:  November 07, 2020 and October 22, 2020 FINDINGS: Lower chest: The visualized heart size within normal limits. No pericardial fluid/thickening. No hiatal hernia. Small bilateral pleural effusions are seen. Hepatobiliary: A small amount of pneumobilia is seen in the inferior right liver lobe and surrounding the posterior gallbladder. A distended fluid and contrast filled gallbladder is seen, likely due to vicarious contrast excretion. There is small layering gallstones. Pancreas:  Unremarkable.  No surrounding inflammatory changes. Spleen: Normal in size. Although limited due to the lack of intravenous contrast, normal in appearance. Adrenals/Urinary Tract: Both adrenal glands appear normal. Multiple bilateral renal calculi are again identified. The largest within the upper pole of the right kidney measuring 5 mm and the largest within the left kidney measuring 4 mm within the midpole. No hydronephrosis. The bladder is decompressed with a Foley catheter. Stomach/Bowel: Small amount of contrast reflux seen within the distal esophagus. There is mildly prominent contrast filled loops of jejunum seen within the left upper quadrant without a clear transition point the remainder of the small bowel is decompressed. There is been interval increased air and stool-filled dilation of the rectum and sigmoid colon to the level of the descending colon junction. At the level of the descending colon/sigmoid colon junction there is focal wall thickening  and a small amount of free air seen anteriorly, consistent with a probable transition point and perforation. There is air seen surrounding the stool at the level of the sigmoid rectal junction which could be concerning for pneumatosis. Vascular/Lymphatic: Scattered aortic atherosclerosis is noted. Reproductive: The prostate is unremarkable. Other: There is a moderate amount of free air seen under the hemidiaphragms and is seen surrounding the porta hepatis. Musculoskeletal: Bilateral sacral decubitus ulceration seen over the inferior coccyx with debris and foci of air. There is non loculated fluid in this area. Degenerative changes seen throughout the lumbar spine most notable of L2-L3. IMPRESSION: 1. There is a focal area of wall thickening with adjacent perforation of the distal descending colon at the junction of the sigmoid colon, likely the transition point. 2. There is moderately dilated  sigmoid colon and rectum which is stool-filled and suggestion of possible pneumatosis. 3. Free air seen under the right hemidiaphragm and within the porta hepatis. 4. Small amount of air seen within the inferior right liver lobe which is concerning for portal venous gas given the patient's findings 5. Bilateral sacral decubitus ulceration with phlegmon and subcutaneous emphysema extending to the inferior coccyx. 6. These results were called by telephone at the time of interpretation on 12/04/2020 at 2:01 am to provider PA Sammy , who verbally acknowledged these results. Electronically Signed   By: Prudencio Pair M.D.   On: 12/29/2020 02:06   DG CHEST PORT 1 VIEW  Result Date: 12/08/2020 CLINICAL DATA:  Intubation EXAM: PORTABLE CHEST 1 VIEW COMPARISON:  Ten days ago FINDINGS: Endotracheal tube with tip at the clavicular heads. The enteric tube reaches the stomach with side port near the GE junction. Dialysis catheter with tips at the upper right atrium. Large volume pneumoperitoneum, known prior CT. Hazy density at the lung  bases from pleural fluid primarily based on CT. IMPRESSION: 1. Enteric tube side-port is at the GE junction. Otherwise unremarkable hardware. 2. Hazy density at the bases from small pleural effusions by prior CT. 3. Pneumoperitoneum as seen on prior CT. Electronically Signed   By: Monte Fantasia M.D.   On: 12/24/2020 07:59   DG Chest Port 1 View  Result Date: 12/10/2020 CLINICAL DATA:  Status post exchange of a dialysis catheter. EXAM: PORTABLE CHEST 1 VIEW COMPARISON:  Single-view of the chest 12/07/2020. FINDINGS: New right IJ approach dialysis catheter is in place with its tip at the superior cavoatrial junction. No pneumothorax. Lungs are clear. Heart size is normal. No acute or focal bony abnormality. IMPRESSION: Dialysis catheter tip projects at the superior cavoatrial junction. Negative for pneumothorax or acute disease. Electronically Signed   By: Inge Rise M.D.   On: 12/10/2020 12:09   DG Chest Port 1 View  Result Date: 12/07/2020 CLINICAL DATA:  Post RIGHT-side dialysis catheter exchange EXAM: PORTABLE CHEST 1 VIEW COMPARISON:  Portable exam 1240 hours compared to 1003 hours FINDINGS: RIGHT jugular dual-lumen central venous catheter with tip projecting over SVC. Normal heart size, mediastinal contours, and pulmonary vascularity. Atherosclerotic calcification aorta. Minimal chronic peribronchial thickening without pulmonary infiltrate, pleural effusion, or pneumothorax. Bones mildly demineralized. IMPRESSION: No pneumothorax following RIGHT jugular line placement. Minimal persistent bronchitic changes. Aortic Atherosclerosis (ICD10-I70.0). Electronically Signed   By: Lavonia Dana M.D.   On: 12/07/2020 12:55   DG Chest Port 1 View  Result Date: 12/06/2020 CLINICAL DATA:  End-stage renal disease. EXAM: PORTABLE CHEST 1 VIEW COMPARISON:  October 29, 2020. FINDINGS: The heart size and mediastinal contours are within normal limits. Both lungs are clear. Right internal jugular dialysis catheter  is unchanged in position with distal tip in expected position of cavoatrial junction. The visualized skeletal structures are unremarkable. IMPRESSION: No active disease. Electronically Signed   By: Marijo Conception M.D.   On: 12/06/2020 10:34   DG Abd Portable 1V  Result Date: 11/28/2020 CLINICAL DATA:  Abdominal distension abdominal pain EXAM: PORTABLE ABDOMEN - 1 VIEW COMPARISON:  November 07, 2020 CT assessment, abdominal plain film from November 15, 2020 FINDINGS: Decreased distension of bowel loops in the abdomen when compared to the study from November 15, 2020. Mild distension currently of scattered gas-filled loops of small bowel. Suggestion of small amount of proximal rectal gas. No acute skeletal process on limited assessment. Signs of basilar atelectasis. IMPRESSION: Decreased distension of bowel loops in  the abdomen when compared to the study from November 15, 2020. Mild distension currently of scattered gas-filled loops of small bowel may reflect mild ileus. Suggestion of small amount of proximal rectal gas. Electronically Signed   By: Zetta Bills M.D.   On: 11/28/2020 16:15   DG C-Arm 1-60 Min-No Report  Result Date: 12/10/2020 Fluoroscopy was utilized by the requesting physician.  No radiographic interpretation.   DG C-Arm 1-60 Min-No Report  Result Date: 12/07/2020 Fluoroscopy was utilized by the requesting physician.  No radiographic interpretation.

## 2020-12-23 DIAGNOSIS — N186 End stage renal disease: Secondary | ICD-10-CM | POA: Diagnosis not present

## 2020-12-23 DIAGNOSIS — K668 Other specified disorders of peritoneum: Secondary | ICD-10-CM | POA: Diagnosis not present

## 2020-12-23 DIAGNOSIS — I480 Paroxysmal atrial fibrillation: Secondary | ICD-10-CM | POA: Diagnosis not present

## 2020-12-23 LAB — CBC
HCT: 26.1 % — ABNORMAL LOW (ref 39.0–52.0)
Hemoglobin: 8.3 g/dL — ABNORMAL LOW (ref 13.0–17.0)
MCH: 30.1 pg (ref 26.0–34.0)
MCHC: 31.8 g/dL (ref 30.0–36.0)
MCV: 94.6 fL (ref 80.0–100.0)
Platelets: 48 10*3/uL — ABNORMAL LOW (ref 150–400)
RBC: 2.76 MIL/uL — ABNORMAL LOW (ref 4.22–5.81)
RDW: 20.7 % — ABNORMAL HIGH (ref 11.5–15.5)
WBC: 6.9 10*3/uL (ref 4.0–10.5)
nRBC: 0 % (ref 0.0–0.2)

## 2020-12-23 LAB — COMPREHENSIVE METABOLIC PANEL
ALT: 11 U/L (ref 0–44)
AST: 21 U/L (ref 15–41)
Albumin: 1.7 g/dL — ABNORMAL LOW (ref 3.5–5.0)
Alkaline Phosphatase: 105 U/L (ref 38–126)
Anion gap: 17 — ABNORMAL HIGH (ref 5–15)
BUN: 48 mg/dL — ABNORMAL HIGH (ref 8–23)
CO2: 21 mmol/L — ABNORMAL LOW (ref 22–32)
Calcium: 6.7 mg/dL — ABNORMAL LOW (ref 8.9–10.3)
Chloride: 99 mmol/L (ref 98–111)
Creatinine, Ser: 4.31 mg/dL — ABNORMAL HIGH (ref 0.61–1.24)
GFR, Estimated: 14 mL/min — ABNORMAL LOW (ref >60)
Glucose, Bld: 91 mg/dL (ref 70–99)
Potassium: 3 mmol/L — ABNORMAL LOW (ref 3.5–5.1)
Sodium: 137 mmol/L (ref 135–145)
Total Bilirubin: 0.9 mg/dL (ref 0.3–1.2)
Total Protein: 4.7 g/dL — ABNORMAL LOW (ref 6.5–8.1)

## 2020-12-23 LAB — GLUCOSE, CAPILLARY
Glucose-Capillary: 76 mg/dL (ref 70–99)
Glucose-Capillary: 113 mg/dL — ABNORMAL HIGH (ref 70–99)
Glucose-Capillary: 132 mg/dL — ABNORMAL HIGH (ref 70–99)

## 2020-12-23 LAB — C-REACTIVE PROTEIN: CRP: 20 mg/dL — ABNORMAL HIGH (ref <1.0)

## 2020-12-23 MED ORDER — LOPERAMIDE HCL 2 MG PO CAPS
2.0000 mg | ORAL_CAPSULE | ORAL | Status: DC | PRN
  Administered 2020-12-23 – 2020-12-29 (×17): 2 mg via ORAL
  Filled 2020-12-23 (×17): qty 1

## 2020-12-23 MED ORDER — METOPROLOL TARTRATE 25 MG PO TABS
25.0000 mg | ORAL_TABLET | Freq: Two times a day (BID) | ORAL | Status: DC
  Administered 2020-12-23 – 2020-12-28 (×9): 25 mg via ORAL
  Filled 2020-12-23 (×11): qty 1

## 2020-12-23 MED ORDER — ALBUMIN HUMAN 25 % IV SOLN
INTRAVENOUS | Status: AC
  Administered 2020-12-23: 25 g via INTRA_ARTERIAL
  Filled 2020-12-23: qty 100

## 2020-12-23 MED ORDER — METOPROLOL TARTRATE 5 MG/5ML IV SOLN
2.5000 mg | Freq: Once | INTRAVENOUS | Status: AC
  Administered 2020-12-23: 2.5 mg via INTRAVENOUS
  Filled 2020-12-23: qty 5

## 2020-12-23 MED ORDER — CALCIUM GLUCONATE-NACL 1-0.675 GM/50ML-% IV SOLN
1.0000 g | Freq: Once | INTRAVENOUS | Status: DC
  Filled 2020-12-23: qty 50

## 2020-12-23 MED ORDER — POTASSIUM CHLORIDE CRYS ER 10 MEQ PO TBCR
10.0000 meq | EXTENDED_RELEASE_TABLET | Freq: Once | ORAL | Status: AC
  Administered 2020-12-23: 10 meq via ORAL
  Filled 2020-12-23: qty 1

## 2020-12-23 MED ORDER — METOPROLOL TARTRATE 25 MG PO TABS
25.0000 mg | ORAL_TABLET | Freq: Three times a day (TID) | ORAL | Status: DC
  Administered 2020-12-23 (×2): 25 mg via ORAL
  Filled 2020-12-23 (×2): qty 1

## 2020-12-23 MED ORDER — METOPROLOL TARTRATE 25 MG PO TABS: 25.0000 mg | ORAL_TABLET | Freq: Three times a day (TID) | ORAL | Status: DC

## 2020-12-23 MED ORDER — CHLORHEXIDINE GLUCONATE CLOTH 2 % EX PADS
6.0000 | MEDICATED_PAD | Freq: Every day | CUTANEOUS | Status: DC
  Administered 2020-12-23 – 2021-01-09 (×17): 6 via TOPICAL

## 2020-12-23 MED ORDER — HEPARIN SODIUM (PORCINE) 1000 UNIT/ML IJ SOLN
INTRAMUSCULAR | Status: AC
  Filled 2020-12-23: qty 4

## 2020-12-23 MED ORDER — AMIODARONE HCL 200 MG PO TABS
200.0000 mg | ORAL_TABLET | Freq: Two times a day (BID) | ORAL | Status: DC
  Administered 2020-12-23 – 2020-12-31 (×15): 200 mg via ORAL
  Filled 2020-12-23 (×16): qty 1

## 2020-12-23 NOTE — Progress Notes (Signed)
PROGRESS NOTE                                                                                                                                                                                                             Patient Demographics:    [name], is a 70 y.o. male, DOB - Jul 15, 1951, JSH:[id number]  Outpatient Primary MD for the patient is Patient, No Pcp Per   Admit date - 12/22/2020   LOS - 3  Chief Complaint  Patient presents with  . Vomiting       Brief Narrative: Patient is a 70 y.o. male with PMHx of ESRD, chronic diarrhea (with extensive work-up in the past), orthostatic hypotension, PAF-anticoagulation on hold due to history of GI bleeding, -presented to the hospital from SNF due to abdominal pain, nausea and vomiting-found to have pneumoperitoneum on imaging studies-underwent exploratory laparotomy without any findings of bowel perforation.  He was also found to be COVID-positive.  He was managed in the [place] stability-transferred to the [facility].  COVID-19 vaccinated status:   Significant Events: 1/19>> Admit to [facility] for abdominal pain-pneumoperitoneum on imaging studies-exploratory laparotomy negative 1/22>> transferred to [facility] service.  Significant studies: 1/20>> CT abdomen/pelvis: Focal area of wall thickening within the adjacent perforation of the distal descending colon, free air under the right diaphragm 1/20>> chest x-ray: Hazy density at the bases from small pleural effusions by prior CT.  COVID-19 medications: None  Antibiotics: Zosyn: 1/19>>  Microbiology data: None  Procedures: Exploratory laparotomy 1/20 ETT 1/20>>1/20  Consults: CCS, PCCM, nephrology  DVT prophylaxis: heparin injection 5,000 Units Start: 12/16/2020 0700 SCDs Start: 12/08/2020 0649    Subjective:   Having some loose BMs-continues to have pain at the operative site.  In A. fib with RVR this  morning.   Assessment  & Plan :   Abdominal pain with nausea/vomiting: Initial concern for perforated viscus-however no evidence of perforated viscus on exploratory laparotomy.  Diet being slowly advanced.  General surgery following-on Zosyn x5 days.  Hypovolemic shock: Resolved-briefly required pressors in the ICU.  Home dose midodrine and Florinef resumed.  Acute hypoxic respiratory failure due to volume overload from missing HD: Currently stable on minimal oxygen.  ESRD: On HD TTS-nephrology following.  Chronic diarrhea: No etiology in spite of extensive GI work-up in the past that any been hospital-Per last  discharge summary-no more work-up recommended.  Now having some loose stools-start as needed Imodium-on Metamucil.  Orthostatic hypotension: Chronic issue-on midodrine/Florinef.  Anemia: Secondary to CKD and acute illness-transfuse if less than 7.  PAF with RVR: Was on amiodarone infusion while in the ICU-transition to oral amiodarone yesterday-in RVR this morning-change amiodarone to twice daily dosing-add low-dose Lopressor (has severe orthostatic hypotension but apparently has essentially been bedbound for the past 1-2 months).  Anticoagulation on hold for the past few months-as patient recently had episode of GI bleeding.    Chronic debility/deconditioning: At the SNF for the past few months-extremely weak-Per patient-he is minimally ambulatory for the past few months.  COVID-19 infection: Incidental finding-supportive care at this point.   Fever: afebrile O2 requirements:  SpO2: 98 % O2 Flow Rate (L/min): 2 L/min FiO2 (%): 40 %   COVID-19 Labs: Recent Labs    12/23/20 0909  CRP 20.0*    No results found for: BNP  No results for input(s): PROCALCITON in the last 168 hours.  Lab Results  Component Value Date   [place] (A) 12/21/2020   [place] NEGATIVE 12/06/2020   [place] NEGATIVE 11/22/2020   [place] NEGATIVE 11/11/2020      Nutrition Problem: Nutrition Problem: Increased nutrient needs Etiology: wound healing,catabolic illness,chronic illness Signs/Symptoms: estimated needs Interventions: MVI,Boost Melene Muller (each supplement provides 350kcal and 20 grams of protein),Refer to RD note for recommendations,Liberalize Diet   RN pressure injury documentation: Pressure Injury 12/05/20 Coccyx Medial;Upper Unstageable - Full thickness tissue loss in which the base of the injury is covered by slough (yellow, tan, gray, green or brown) and/or eschar (tan, brown or black) in the wound bed. prevous stage 2 on sacrum (Active)  12/05/20 1000  Location: Coccyx  Location Orientation: Medial;Upper  Staging: Unstageable - Full thickness tissue loss in which the base of the injury is covered by slough (yellow, tan, gray, green or brown) and/or eschar (tan, brown or black) in the wound bed.  Wound Description (Comments): prevous stage 2 on sacrum, now evolved to unstageable. Black eschar covering wound bed  Present on Admission: No    ABG:    Component Value Date/Time   PHART 7.261 (L) 12/17/2020 0905   PCO2ART 30.1 (L) 12/19/2020 0905   PO2ART 149 (H) 12/17/2020 0905   HCO3 13.3 (L) 12/02/2020 0905   TCO2 13 (L) 12/14/2020 0648   ACIDBASEDEF 12.6 (H) 12/13/2020 0905   O2SAT 97.0 12/06/2020 0905    Vent Settings: N/A   Condition - Stable  Family Communication  : Daughter-[name]-[phone]  Code Status :  Full Code  Diet :  Diet Order            Diet renal with fluid restriction Fluid restriction: 1200 mL Fluid; Room service appropriate? Yes; Fluid consistency: Thin  Diet effective now                  Disposition Plan  :   Status is: Inpatient  Remains inpatient appropriate because:Inpatient level of care appropriate due to severity of illness   Dispo: The patient is from: SNF              Anticipated d/c is to: SNF              Anticipated d/c date is: > 3 days               Patient currently is not medically stable to d/c.   Difficult to place patient No   Barriers to discharge:  Exploratory laparotomy-slowly gaining GI function-not yet ready for discharge.  Antimicorbials  :    Anti-infectives (From admission, onward)   Start     Dose/Rate Route Frequency Ordered Stop   12/06/2020 1500  piperacillin-tazobactam (ZOSYN) IVPB 3.375 g        3.375 g 12.5 mL/hr over 240 Minutes Intravenous Every 12 hours 12/06/2020 1219     12/03/2020 1200  piperacillin-tazobactam (ZOSYN) IVPB 2.25 g  Status:  Discontinued        2.25 g 100 mL/hr over 30 Minutes Intravenous Every 8 hours 12/02/2020 0205 12/27/2020 1219   12/26/2020 0215  piperacillin-tazobactam (ZOSYN) IVPB 3.375 g        3.375 g 100 mL/hr over 30 Minutes Intravenous  Once 12/12/2020 0205 12/23/2020 0435      Inpatient Medications  Scheduled Meds: . (feeding supplement) PROSource Plus  30 mL Oral TID with meals  . amiodarone  200 mg Oral BID  . Chlorhexidine Gluconate Cloth  6 each Topical Daily  . Chlorhexidine Gluconate Cloth  6 each Topical Q0600  . Chlorhexidine Gluconate Cloth  6 each Topical Q0600  . [START ON 12/25/2020] collagenase   Topical Daily  . famotidine  20 mg Per Tube Daily  . feeding supplement  1 Container Oral TID BM  . fludrocortisone  0.2 mg Per Tube Daily  . heparin  5,000 Units Subcutaneous Q8H  . insulin aspart  0-15 Units Subcutaneous TID WC  . insulin aspart  0-5 Units Subcutaneous QHS  . metoprolol tartrate  25 mg Oral TID  . midodrine  10 mg Per Tube TID WC  . multivitamin  1 tablet Oral QHS  . mupirocin ointment   Nasal BID  . psyllium  1 packet Oral Daily  . sodium bicarbonate  100 mEq Intravenous Once  . sodium chloride flush  10-40 mL Intracatheter Q12H   Continuous Infusions: . sodium chloride 10 mL/hr at 12/21/20 1700  . sodium chloride 10 mL/hr at 12/21/20 1700  . piperacillin-tazobactam (ZOSYN)  IV 3.375 g (12/23/20 1413)   PRN Meds:.sodium chloride, docusate, fentaNYL  (SUBLIMAZE) injection, heparin, loperamide, polyethylene glycol, sodium chloride flush   Time Spent in minutes  25  See all Orders from today for further details   Oren Binet M.D on 12/23/2020 at 2:43 PM  To page go to [email or web address] - use universal password  [facility] -  Office  [phone]    Objective:   Vitals:   12/23/20 0402 12/23/20 0819 12/23/20 1132 12/23/20 1410  BP: 118/72 120/87 (!) 143/75 132/74  Pulse: (!) 104 (!) 116 75 72  Resp: 20  19 18   Temp: 98.1 F (36.7 C) 98.3 F (36.8 C) 98.2 F (36.8 C) 98.7 F (37.1 C)  TempSrc: Oral Oral Oral Oral  SpO2: 98% 100% 99% 98%  Weight: 70.2 kg     Height:        Wt Readings from Last 3 Encounters:  12/23/20 70.2 kg  12/18/20 61.2 kg  12/10/20 66.3 kg     Intake/Output Summary (Last 24 hours) at 12/23/2020 1443 Last data filed at 12/23/2020 1217 Gross per 24 hour  Intake 761 ml  Output -  Net 761 ml     Physical Exam Gen Exam:Alert awake-not in any distress HEENT:atraumatic, normocephalic Chest: B/L clear to auscultation anteriorly CVS:S1S2 regular Abdomen:soft non tender, non distended Extremities:no edema Neurology: Non focal-but with severe generalized weakness. Skin: no rash   Data Review:    CBC Recent Labs  Lab 12/18/20 1145 12/02/2020  0040 12/17/2020 0648 12/21/20 0523 12/23/20 0909  WBC 7.2 15.4*  --  12.7* 6.9  HGB 8.8* 8.8* 10.5* 8.0* 8.3*  HCT 30.0* 29.4* 31.0* 26.1* 26.1*  PLT 120* 93*  --  74* 48*  MCV 101.7* 97.7  --  95.3 94.6  MCH 29.8 29.2  --  29.2 30.1  MCHC 29.3* 29.9*  --  30.7 31.8  RDW 21.5* 20.5*  --  20.4* 20.7*  LYMPHSABS 0.7 0.8  --   --   --   MONOABS 0.7 1.1*  --   --   --   EOSABS 0.0 0.0  --   --   --   BASOSABS 0.0 0.0  --   --   --     Chemistries  Recent Labs  Lab 12/18/20 1145 12/22/2020 0040 12/22/2020 0648 12/21/20 0523 12/23/20 0909  NA 136 139 131* 141 137  K 4.0 3.4* 4.5 4.2 3.0*  CL 102 113*  --  102 99  CO2 18* 11*  --  12*  21*  GLUCOSE 73 66*  --  95 91  BUN 54* 61*  --  85* 48*  CREATININE 5.83* 5.27*  --  7.26* 4.31*  CALCIUM 7.1* 4.9*  --  5.9* 6.7*  AST  --  28  --   --  21  ALT  --  7  --   --  11  ALKPHOS  --  68  --   --  105  BILITOT  --  0.9  --   --  0.9   ------------------------------------------------------------------------------------------------------------------ No results for input(s): CHOL, HDL, LDLCALC, TRIG, CHOLHDL, LDLDIRECT in the last 72 hours.  Lab Results  Component Value Date   HGBA1C 4.1 (L) 12/21/2020   ------------------------------------------------------------------------------------------------------------------ No results for input(s): TSH, T4TOTAL, T3FREE, THYROIDAB in the last 72 hours.  Invalid input(s): FREET3 ------------------------------------------------------------------------------------------------------------------ No results for input(s): VITAMINB12, FOLATE, FERRITIN, TIBC, IRON, RETICCTPCT in the last 72 hours.  Coagulation profile No results for input(s): INR, PROTIME in the last 168 hours.  No results for input(s): DDIMER in the last 72 hours.  Cardiac Enzymes No results for input(s): CKMB, TROPONINI, MYOGLOBIN in the last 168 hours.  Invalid input(s): CK ------------------------------------------------------------------------------------------------------------------ No results found for: BNP  Micro Results Recent Results (from the past 240 hour(s))  Resp Panel by RT-PCR (Flu A&B, Covid) Nasopharyngeal Swab     Status: Abnormal   Collection Time: 12/21/2020  2:50 AM   Specimen: Nasopharyngeal Swab; Nasopharyngeal(NP) swabs in vial transport medium  Result Value Ref Range Status   SARS Coronavirus 2 by RT PCR POSITIVE (A) NEGATIVE Final    Comment: RESULT CALLED TO, READ BACK BY AND VERIFIED WITH: C YELVERTON RN 12/13/2020 0349 JDW (NOTE) SARS-CoV-2 target nucleic acids are DETECTED.  The SARS-CoV-2 RNA is generally detectable in upper  respiratory specimens during the acute phase of infection. Positive results are indicative of the presence of the identified virus, but do not rule out bacterial infection or co-infection with other pathogens not detected by the test. Clinical correlation with patient history and other diagnostic information is necessary to determine patient infection status. The expected result is Negative.  Fact Sheet for Patients: EntrepreneurPulse.com.au  Fact Sheet for Healthcare Providers: IncredibleEmployment.be  This test is not yet approved or cleared by the [place] FDA and  has been authorized for detection and/or diagnosis of SARS-CoV-2 by FDA under an Emergency Use Authorization (EUA).  This EUA will remain in effect (meaning this test can be u sed)  for the duration of  the COVID-19 declaration under Section 564(b)(1) of the Act, 21 U.S.C. section 360bbb-3(b)(1), unless the authorization is terminated or revoked sooner.     Influenza A by PCR NEGATIVE NEGATIVE Final   Influenza B by PCR NEGATIVE NEGATIVE Final    Comment: (NOTE) The Xpert Xpress SARS-CoV-2/FLU/RSV plus assay is intended as an aid in the diagnosis of influenza from Nasopharyngeal swab specimens and should not be used as a sole basis for treatment. Nasal washings and aspirates are unacceptable for Xpert Xpress SARS-CoV-2/FLU/RSV testing.  Fact Sheet for Patients: EntrepreneurPulse.com.au  Fact Sheet for Healthcare Providers: IncredibleEmployment.be  This test is not yet approved or cleared by the [place] FDA and has been authorized for detection and/or diagnosis of SARS-CoV-2 by FDA under an Emergency Use Authorization (EUA). This EUA will remain in effect (meaning this test can be used) for the duration of the COVID-19 declaration under Section 564(b)(1) of the Act, 21 U.S.C. section 360bbb-3(b)(1), unless the authorization is  terminated or revoked.  Performed at [facility], [place] [address], [place], [place] [address]   MRSA PCR Screening     Status: Abnormal   Collection Time: 12/21/20 11:39 PM  Result Value Ref Range Status   MRSA by PCR POSITIVE (A) NEGATIVE Final    Comment:        The GeneXpert MRSA Assay (FDA approved for NASAL specimens only), is one component of a comprehensive MRSA colonization surveillance program. It is not intended to diagnose MRSA infection nor to guide or monitor treatment for MRSA infections. RESULT CALLED TO, READ BACK BY AND VERIFIED WITH: D TUCK RN 12/22/20 0134 JDW Performed at [facility], [address], [place], [place] [address]     Radiology Reports CT Abdomen Pelvis Wo Contrast  Result Date: 12/04/2020 CLINICAL DATA:  Abdominal pain EXAM: CT ABDOMEN AND PELVIS WITHOUT CONTRAST TECHNIQUE: Multidetector CT imaging of the abdomen and pelvis was performed following the standard protocol without IV contrast. COMPARISON:  November 07, 2020 and October 22, 2020 FINDINGS: Lower chest: The visualized heart size within normal limits. No pericardial fluid/thickening. No hiatal hernia. Small bilateral pleural effusions are seen. Hepatobiliary: A small amount of pneumobilia is seen in the inferior right liver lobe and surrounding the posterior gallbladder. A distended fluid and contrast filled gallbladder is seen, likely due to vicarious contrast excretion. There is small layering gallstones. Pancreas:  Unremarkable.  No surrounding inflammatory changes. Spleen: Normal in size. Although limited due to the lack of intravenous contrast, normal in appearance. Adrenals/Urinary Tract: Both adrenal glands appear normal. Multiple bilateral renal calculi are again identified. The largest within the upper pole of the right kidney measuring 5 mm and the largest within the left kidney measuring 4 mm within the midpole. No hydronephrosis. The bladder is decompressed with a Foley  catheter. Stomach/Bowel: Small amount of contrast reflux seen within the distal esophagus. There is mildly prominent contrast filled loops of jejunum seen within the left upper quadrant without a clear transition point the remainder of the small bowel is decompressed. There is been interval increased air and stool-filled dilation of the rectum and sigmoid colon to the level of the descending colon junction. At the level of the descending colon/sigmoid colon junction there is focal wall thickening and a small amount of free air seen anteriorly, consistent with a probable transition point and perforation. There is air seen surrounding the stool at the level of the sigmoid rectal junction which could be concerning  for pneumatosis. Vascular/Lymphatic: Scattered aortic atherosclerosis is noted. Reproductive: The prostate is unremarkable. Other: There is a moderate amount of free air seen under the hemidiaphragms and is seen surrounding the porta hepatis. Musculoskeletal: Bilateral sacral decubitus ulceration seen over the inferior coccyx with debris and foci of air. There is non loculated fluid in this area. Degenerative changes seen throughout the lumbar spine most notable of L2-L3. IMPRESSION: 1. There is a focal area of wall thickening with adjacent perforation of the distal descending colon at the junction of the sigmoid colon, likely the transition point. 2. There is moderately dilated sigmoid colon and rectum which is stool-filled and suggestion of possible pneumatosis. 3. Free air seen under the right hemidiaphragm and within the porta hepatis. 4. Small amount of air seen within the inferior right liver lobe which is concerning for portal venous gas given the patient's findings 5. Bilateral sacral decubitus ulceration with phlegmon and subcutaneous emphysema extending to the inferior coccyx. 6. These results were called by telephone at the time of interpretation on 12/25/2020 at 2:01 am to provider PA Sammy , who  verbally acknowledged these results. Electronically Signed   By: Prudencio Pair M.D.   On: 12/24/2020 02:06   DG CHEST PORT 1 VIEW  Result Date: 12/12/2020 CLINICAL DATA:  Intubation EXAM: PORTABLE CHEST 1 VIEW COMPARISON:  Ten days ago FINDINGS: Endotracheal tube with tip at the clavicular heads. The enteric tube reaches the stomach with side port near the GE junction. Dialysis catheter with tips at the upper right atrium. Large volume pneumoperitoneum, known prior CT. Hazy density at the lung bases from pleural fluid primarily based on CT. IMPRESSION: 1. Enteric tube side-port is at the GE junction. Otherwise unremarkable hardware. 2. Hazy density at the bases from small pleural effusions by prior CT. 3. Pneumoperitoneum as seen on prior CT. Electronically Signed   By: Monte Fantasia M.D.   On: 12/24/2020 07:59   DG Chest Port 1 View  Result Date: 12/10/2020 CLINICAL DATA:  Status post exchange of a dialysis catheter. EXAM: PORTABLE CHEST 1 VIEW COMPARISON:  Single-view of the chest 12/07/2020. FINDINGS: New right IJ approach dialysis catheter is in place with its tip at the superior cavoatrial junction. No pneumothorax. Lungs are clear. Heart size is normal. No acute or focal bony abnormality. IMPRESSION: Dialysis catheter tip projects at the superior cavoatrial junction. Negative for pneumothorax or acute disease. Electronically Signed   By: Inge Rise M.D.   On: 12/10/2020 12:09   DG Chest Port 1 View  Result Date: 12/07/2020 CLINICAL DATA:  Post RIGHT-side dialysis catheter exchange EXAM: PORTABLE CHEST 1 VIEW COMPARISON:  Portable exam 1240 hours compared to 1003 hours FINDINGS: RIGHT jugular dual-lumen central venous catheter with tip projecting over SVC. Normal heart size, mediastinal contours, and pulmonary vascularity. Atherosclerotic calcification aorta. Minimal chronic peribronchial thickening without pulmonary infiltrate, pleural effusion, or pneumothorax. Bones mildly demineralized.  IMPRESSION: No pneumothorax following RIGHT jugular line placement. Minimal persistent bronchitic changes. Aortic Atherosclerosis (ICD10-I70.0). Electronically Signed   By: Lavonia Dana M.D.   On: 12/07/2020 12:55   DG Chest Port 1 View  Result Date: 12/06/2020 CLINICAL DATA:  End-stage renal disease. EXAM: PORTABLE CHEST 1 VIEW COMPARISON:  October 29, 2020. FINDINGS: The heart size and mediastinal contours are within normal limits. Both lungs are clear. Right internal jugular dialysis catheter is unchanged in position with distal tip in expected position of cavoatrial junction. The visualized skeletal structures are unremarkable. IMPRESSION: No active disease. Electronically Signed  By: Marijo Conception M.D.   On: 12/06/2020 10:34   DG Abd Portable 1V  Result Date: 11/28/2020 CLINICAL DATA:  Abdominal distension abdominal pain EXAM: PORTABLE ABDOMEN - 1 VIEW COMPARISON:  November 07, 2020 CT assessment, abdominal plain film from November 15, 2020 FINDINGS: Decreased distension of bowel loops in the abdomen when compared to the study from November 15, 2020. Mild distension currently of scattered gas-filled loops of small bowel. Suggestion of small amount of proximal rectal gas. No acute skeletal process on limited assessment. Signs of basilar atelectasis. IMPRESSION: Decreased distension of bowel loops in the abdomen when compared to the study from November 15, 2020. Mild distension currently of scattered gas-filled loops of small bowel may reflect mild ileus. Suggestion of small amount of proximal rectal gas. Electronically Signed   By: Zetta Bills M.D.   On: 11/28/2020 16:15   DG C-Arm 1-60 Min-No Report  Result Date: 12/10/2020 Fluoroscopy was utilized by the requesting physician.  No radiographic interpretation.   DG C-Arm 1-60 Min-No Report  Result Date: 12/07/2020 Fluoroscopy was utilized by the requesting physician.  No radiographic interpretation.

## 2020-12-23 NOTE — Plan of Care (Signed)
Pt satisfied with pain regimen. Pt declined q2 turns. Pt drank Boost. Pt HR consistently 110-120 around 0400. MD notified. Metoprolol ordered and administered. Medication therapeutic.   Problem: Education: Goal: Knowledge of General Education information will improve Description: Including pain rating scale, medication(s)/side effects and non-pharmacologic comfort measures Outcome: Progressing   Problem: Health Behavior/Discharge Planning: Goal: Ability to manage health-related needs will improve Outcome: Progressing   Problem: Clinical Measurements: Goal: Ability to maintain clinical measurements within normal limits will improve Outcome: Progressing Goal: Will remain free from infection Outcome: Progressing Goal: Diagnostic test results will improve Outcome: Progressing Goal: Respiratory complications will improve Outcome: Progressing Goal: Cardiovascular complication will be avoided Outcome: Progressing   Problem: Activity: Goal: Risk for activity intolerance will decrease Outcome: Progressing   Problem: Nutrition: Goal: Adequate nutrition will be maintained Outcome: Progressing   Problem: Elimination: Goal: Will not experience complications related to bowel motility Outcome: Progressing Goal: Will not experience complications related to urinary retention Outcome: Progressing   Problem: Pain Managment: Goal: General experience of comfort will improve Outcome: Progressing   Problem: Safety: Goal: Ability to remain free from injury will improve Outcome: Progressing   Problem: Skin Integrity: Goal: Risk for impaired skin integrity will decrease Outcome: Progressing   Problem: Education: Goal: Knowledge of risk factors and measures for prevention of condition will improve Outcome: Progressing   Problem: Coping: Goal: Psychosocial and spiritual needs will be supported Outcome: Progressing   Problem: Respiratory: Goal: Will maintain a patent airway Outcome:  Progressing Goal: Complications related to the disease process, condition or treatment will be avoided or minimized Outcome: Progressing   Problem: Education: Goal: Required Educational Video(s) Outcome: Progressing   Problem: Clinical Measurements: Goal: Ability to maintain clinical measurements within normal limits will improve Outcome: Progressing Goal: Postoperative complications will be avoided or minimized Outcome: Progressing   Problem: Skin Integrity: Goal: Demonstration of wound healing without infection will improve Outcome: Progressing   Problem: Skin Integrity: Goal: Risk for impaired skin integrity will decrease Outcome: Progressing   Problem: Coping: Goal: Level of anxiety will decrease Outcome: Completed/Met   Problem: Activity: Goal: Ability to tolerate increased activity will improve Outcome: Completed/Met   Problem: Respiratory: Goal: Ability to maintain a clear airway and adequate ventilation will improve Outcome: Completed/Met   Problem: Role Relationship: Goal: Method of communication will improve Outcome: Completed/Met

## 2020-12-23 NOTE — Progress Notes (Signed)
[facility] Progress Note  3 Days Post-Op  Subjective: CC-  States that abdomen is a little sore but he denies any significant pain. Tolerating full liquids. Denies n/v. Loose BM x2 yesterday. WBC normalized 6.9, afebrile.   Objective: Vital signs in last 24 hours: Temp:  [97.9 F (36.6 C)-98.8 F (37.1 C)] 98.3 F (36.8 C) (01/23 0819) Pulse Rate:  [92-116] 116 (01/23 0819) Resp:  [16-20] 20 (01/23 0402) BP: (94-127)/(59-87) 120/87 (01/23 0819) SpO2:  [98 %-100 %] 100 % (01/23 0819) Weight:  [70.2 kg] 70.2 kg (01/23 0402) Last BM Date: 12/23/20  Intake/Output from previous day: 01/22 0701 - 01/23 0700 In: 287 [P.O.:237; IV Piggyback:50] Out: -  Intake/Output this shift: No intake/output data recorded.  PE: Gen:  Alert, NAD, pleasant HEENT: EOM's intact, pupils equal and round Pulm:  rate and effort normal Abd: Soft, ND, appropriately tender, +BS, midline incision cdi with staples intact and no erythema or drainage Skin: warm and dry   Lab Results:  Recent Labs    12/21/20 0523 12/23/20 0909  WBC 12.7* 6.9  HGB 8.0* 8.3*  HCT 26.1* 26.1*  PLT 74* 48*   BMET Recent Labs    12/21/20 0523 12/23/20 0909  NA 141 137  K 4.2 3.0*  CL 102 99  CO2 12* 21*  GLUCOSE 95 91  BUN 85* 48*  CREATININE 7.26* 4.31*  CALCIUM 5.9* 6.7*   PT/INR No results for input(s): LABPROT, INR in the last 72 hours. CMP     Component Value Date/Time   NA 137 12/23/2020 0909   K 3.0 (L) 12/23/2020 0909   CL 99 12/23/2020 0909   CO2 21 (L) 12/23/2020 0909   GLUCOSE 91 12/23/2020 0909   BUN 48 (H) 12/23/2020 0909   CREATININE 4.31 (H) 12/23/2020 0909   CALCIUM 6.7 (L) 12/23/2020 0909   PROT 4.7 (L) 12/23/2020 0909   ALBUMIN 1.7 (L) 12/23/2020 0909   AST 21 12/23/2020 0909   ALT 11 12/23/2020 0909   ALKPHOS 105 12/23/2020 0909   BILITOT 0.9 12/23/2020 0909   GFRNONAA 14 (L) 12/23/2020 0909   GFRAA 19 (L) 06/15/2018 0407   Lipase     Component Value  Date/Time   LIPASE 14 12/30/2020 0040       Studies/Results: No results found.  Anti-infectives: Anti-infectives (From admission, onward)   Start     Dose/Rate Route Frequency Ordered Stop   12/19/2020 1500  piperacillin-tazobactam (ZOSYN) IVPB 3.375 g        3.375 g 12.5 mL/hr over 240 Minutes Intravenous Every 12 hours 12/29/2020 1219     12/21/2020 1200  piperacillin-tazobactam (ZOSYN) IVPB 2.25 g  Status:  Discontinued        2.25 g 100 mL/hr over 30 Minutes Intravenous Every 8 hours 12/22/2020 0205 12/10/2020 1219   12/23/2020 0215  piperacillin-tazobactam (ZOSYN) IVPB 3.375 g        3.375 g 100 mL/hr over 30 Minutes Intravenous  Once 12/02/2020 0205 12/19/2020 0435       Assessment/Plan ESRD - HD TTS DM2 Paroxysmal A fib Orthostatic hypotension Chronic diarrhea - extensive work up by GI fairly negative. colonoscopy 11/21/20 only revealed large ulceration involving distal sigmoid colon, path and GI panel negative at the time. Continue fiber  Pneumoperitoneum, pneumatosis, portal venous gas S/p negative exploratory laparotomy 1/20 Dr. Bobbye Morton - POD#3 - intraop: no acute findings, no contamination, excellent blood flow to intestine, congested rectosigmoid colon - Tolerating fulls and having bowel function. WBC has normalized.  Advance to renal diet. Continue IV abx to complete 5 days.  ID - zosyn 1/20>>day#4/5 FEN -  renal diet, Boost VTE - SCDs, sq heparin Foley - none   LOS: 3 days    Wellington Hampshire, [facility] 12/23/2020, 11:17 AM Please see Amion for pager number during day hours 7:00am-4:30pm
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
## 2020-12-24 DIAGNOSIS — K668 Other specified disorders of peritoneum: Secondary | ICD-10-CM | POA: Diagnosis not present

## 2020-12-24 DIAGNOSIS — I480 Paroxysmal atrial fibrillation: Secondary | ICD-10-CM | POA: Diagnosis not present

## 2020-12-24 DIAGNOSIS — N186 End stage renal disease: Secondary | ICD-10-CM | POA: Diagnosis not present

## 2020-12-24 LAB — CBC
HCT: 25.5 % — ABNORMAL LOW (ref 39.0–52.0)
Hemoglobin: 7.8 g/dL — ABNORMAL LOW (ref 13.0–17.0)
MCH: 29 pg (ref 26.0–34.0)
MCHC: 30.6 g/dL (ref 30.0–36.0)
MCV: 94.8 fL (ref 80.0–100.0)
Platelets: 46 10*3/uL — ABNORMAL LOW (ref 150–400)
RBC: 2.69 MIL/uL — ABNORMAL LOW (ref 4.22–5.81)
RDW: 20.9 % — ABNORMAL HIGH (ref 11.5–15.5)
WBC: 4.6 10*3/uL (ref 4.0–10.5)
nRBC: 0 % (ref 0.0–0.2)

## 2020-12-24 LAB — COMPREHENSIVE METABOLIC PANEL
ALT: 9 U/L (ref 0–44)
AST: 30 U/L (ref 15–41)
Albumin: 1.7 g/dL — ABNORMAL LOW (ref 3.5–5.0)
Alkaline Phosphatase: 84 U/L (ref 38–126)
Anion gap: 16 — ABNORMAL HIGH (ref 5–15)
BUN: 32 mg/dL — ABNORMAL HIGH (ref 8–23)
CO2: 18 mmol/L — ABNORMAL LOW (ref 22–32)
Calcium: 6.6 mg/dL — ABNORMAL LOW (ref 8.9–10.3)
Chloride: 103 mmol/L (ref 98–111)
Creatinine, Ser: 3 mg/dL — ABNORMAL HIGH (ref 0.61–1.24)
GFR, Estimated: 22 mL/min — ABNORMAL LOW (ref >60)
Glucose, Bld: 133 mg/dL — ABNORMAL HIGH (ref 70–99)
Potassium: 3.3 mmol/L — ABNORMAL LOW (ref 3.5–5.1)
Sodium: 137 mmol/L (ref 135–145)
Total Bilirubin: 0.6 mg/dL (ref 0.3–1.2)
Total Protein: 4.3 g/dL — ABNORMAL LOW (ref 6.5–8.1)

## 2020-12-24 LAB — GLUCOSE, CAPILLARY
Glucose-Capillary: 108 mg/dL — ABNORMAL HIGH (ref 70–99)
Glucose-Capillary: 118 mg/dL — ABNORMAL HIGH (ref 70–99)
Glucose-Capillary: 144 mg/dL — ABNORMAL HIGH (ref 70–99)
Glucose-Capillary: 148 mg/dL — ABNORMAL HIGH (ref 70–99)
Glucose-Capillary: 89 mg/dL (ref 70–99)

## 2020-12-24 LAB — C-REACTIVE PROTEIN: CRP: 18.1 mg/dL — ABNORMAL HIGH (ref <1.0)

## 2020-12-24 MED ORDER — COLLAGENASE 250 UNIT/GM EX OINT
TOPICAL_OINTMENT | Freq: Every day | CUTANEOUS | Status: DC
  Filled 2020-12-24: qty 30

## 2020-12-24 MED ORDER — DAKINS (1/4 STRENGTH) 0.125 % EX SOLN
Freq: Two times a day (BID) | CUTANEOUS | Status: AC
  Filled 2020-12-24: qty 473

## 2020-12-24 NOTE — Progress Notes (Signed)
Physical Therapy Wound Treatment Patient Details  Name: [name] MRN: [id number] Date of Birth: 15-Mar-1951  Today's Date: 12/24/2020 Time: 1310-1350 Time Calculation (min): 40 min  Subjective  Subjective: Pleasant and agreeable to wound care. Patient and Family Stated Goals: heal wound Date of Onset:  (unknown - pt reports at least a month) Prior Treatments: unknown  Pain Score:  0/10  Wound Assessment  Pressure Injury 12/05/20 Coccyx Medial;Upper Unstageable - Full thickness tissue loss in which the base of the injury is covered by slough (yellow, tan, gray, green or brown) and/or eschar (tan, brown or black) in the wound bed. prevous stage 2 on sacrum (Active)  Dressing Type ABD;Barrier Film (skin prep);Gauze (Comment);Moist to dry 12/24/20 1406  Dressing Changed;Clean;Dry;Intact 12/24/20 1406  Dressing Change Frequency Twice a day 12/24/20 1406  State of Healing Eschar 12/24/20 1406  Site / Wound Assessment Black;Brown 12/24/20 1406  % Wound base Red or Granulating 0% 12/24/20 1406  % Wound base Yellow/Fibrinous Exudate 0% 12/24/20 1406  % Wound base Black/Eschar 100% 12/24/20 1406  % Wound base Other/Granulation Tissue (Comment) 0% 12/24/20 1406  Peri-wound Assessment Erythema (blanchable) 12/24/20 1406  Wound Length (cm) 8 cm 12/21/20 1500  Wound Width (cm) 5 cm 12/21/20 1500  Wound Depth (cm) 0.1 cm 12/21/20 1500  Wound Surface Area (cm^2) 40 cm^2 12/21/20 1500  Wound Volume (cm^3) 4 cm^3 12/21/20 1500  Tunneling (cm) 0 12/21/20 1500  Undermining (cm) 0 12/21/20 1500  Margins Unattached edges (unapproximated) 12/24/20 1406  Drainage Amount Moderate 12/24/20 1406  Drainage Description Serosanguineous 12/24/20 1406  Treatment Debridement (Selective);Hydrotherapy (Pulse lavage);Other (Comment) 12/24/20 1406      Hydrotherapy Pulsed lavage therapy - wound location: sacrum Pulsed Lavage with Suction (psi): 12 psi Pulsed Lavage with Suction - Normal Saline Used: 1000  mL Pulsed Lavage Tip: Tip with splash shield Selective Debridement Selective Debridement - Location: sacrum Selective Debridement - Tools Used: Scalpel;Scissors;Forceps Selective Debridement - Tissue Removed: eschar, brown slough   Wound Assessment and Plan  Wound Therapy - Assess/Plan/Recommendations Wound Therapy - Clinical Statement: Continued removal of necrotic edges, brown slough and eschar. Pt with increased, foul smelling drainage. Pt will benefit from hydrotherapy and for selective debridement of unviable tissue in order to decrease bioburden and promote wound bed healing. Wound Therapy - Functional Problem List: global weakness and immobility Factors Delaying/Impairing Wound Healing: Immobility;Multiple medical problems Hydrotherapy Plan: Debridement;Dressing change;Patient/family education;Pulsatile lavage with suction Wound Therapy - Frequency: 6X / week Wound Therapy - Follow Up Recommendations: Skilled nursing facility Wound Plan: see above; and orders for Dakin's solution until 1/25 then Santyl  Wound Therapy Goals- Improve the function of patient's integumentary system by progressing the wound(s) through the phases of wound healing (inflammation - proliferation - remodeling) by: Decrease Necrotic Tissue to: 75 Decrease Necrotic Tissue - Progress: Progressing toward goal Increase Granulation Tissue to: 25 Increase Granulation Tissue - Progress: Progressing toward goal Goals/treatment plan/discharge plan were made with and agreed upon by patient/family: Yes Time For Goal Achievement: 7 days Wound Therapy - Potential for Goals: Good  Goals will be updated until maximal potential achieved or discharge criteria met.  Discharge criteria: when goals achieved, discharge from hospital, MD decision/surgical intervention, no progress towards goals, refusal/missing three consecutive treatments without notification or medical reason.  GP    Wyona Almas, PT, DPT Acute  Rehabilitation Services Pager [phone] Office [phone]   [name] 12/24/2020, 2:09 PM
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
## 2020-12-24 NOTE — Discharge Instructions (Signed)
[facility]      [facility] [phone]  OPEN ABDOMINAL SURGERY: POST OP INSTRUCTIONS  Always review your discharge instruction sheet given to you by the facility where your surgery was performed.  IF YOU HAVE DISABILITY OR FAMILY LEAVE FORMS, YOU MUST BRING THEM TO THE OFFICE FOR PROCESSING.  PLEASE DO NOT GIVE THEM TO YOUR DOCTOR.  1. A prescription for pain medication may be given to you upon discharge.  Take your pain medication as prescribed, if needed.  If narcotic pain medicine is not needed, then you may take acetaminophen (Tylenol) or ibuprofen (Advil) as needed. 2. Take your usually prescribed medications unless otherwise directed. 3. If you need a refill on your pain medication, please contact your pharmacy. They will contact our office to request authorization.  Prescriptions will not be filled after 5pm or on week-ends. 4. You should follow a light diet the first few days after arrival home, such as soup and crackers, pudding, etc.unless your doctor has advised otherwise. A high-fiber, low fat diet can be resumed as tolerated.   Be sure to include lots of fluids daily. Most patients will experience some swelling and bruising on the chest and neck area.  Ice packs will help.  Swelling and bruising can take several days to resolve 5. Most patients will experience some swelling and bruising in the area of the incision. Ice pack will help. Swelling and bruising can take several days to resolve..  6. It is common to experience some constipation if taking pain medication after surgery.  Increasing fluid intake and taking a stool softener will usually help or prevent this problem from occurring.  A mild laxative (Milk of Magnesia or Miralax) should be taken according to package directions if there are no bowel movements after 48 hours. 7.  You may have steri-strips (small skin tapes) in place directly over the incision.  These strips should be left on the skin for 7-10 days.  If your  surgeon used skin glue on the incision, you may shower in 24 hours.  The glue will flake off over the next 2-3 weeks.  Any sutures or staples will be removed at the office during your follow-up visit. You may find that a light gauze bandage over your incision may keep your staples from being rubbed or pulled. You may shower and replace the bandage daily. 8. ACTIVITIES:  You may resume regular (light) daily activities beginning the next day--such as daily self-care, walking, climbing stairs--gradually increasing activities as tolerated.  You may have sexual intercourse when it is comfortable.  Refrain from any heavy lifting or straining until approved by your doctor. a. You may drive when you no longer are taking prescription pain medication, you can comfortably wear a seatbelt, and you can safely maneuver your car and apply brakes b. Return to Work: ___________________________________ 9. You should see your doctor in the office for a follow-up appointment approximately two weeks after your surgery.  Make sure that you call for this appointment within a day or two after you arrive home to insure a convenient appointment time. OTHER INSTRUCTIONS:  _____________________________________________________________ _____________________________________________________________  WHEN TO CALL YOUR DOCTOR: 1. Fever over 101.0 2. Inability to urinate 3. Nausea and/or vomiting 4. Extreme swelling or bruising 5. Continued bleeding from incision. 6. Increased pain, redness, or drainage from the incision. 7. Difficulty swallowing or breathing 8. Muscle cramping or spasms. 9. Numbness or tingling in hands or feet or around lips.  The clinic staff is available to   answer your questions during regular business hours.  Please don't hesitate to call and ask to speak to one of the nurses if you have concerns.  For further questions, please visit [email or web address]   

## 2020-12-24 NOTE — Progress Notes (Addendum)
PROGRESS NOTE                                                                                                                                                                                                             Patient Demographics:    [name], is a 70 y.o. male, DOB - [birth year], EHU:[id number]  Outpatient Primary MD for the patient is Patient, No Pcp Per   Admit date - 12/03/2020   LOS - 4  Chief Complaint  Patient presents with  . Vomiting       Brief Narrative: Patient is a 70 y.o. male with PMHx of ESRD, chronic diarrhea (with extensive work-up in the past), orthostatic hypotension, PAF-anticoagulation on hold due to history of GI bleeding, -presented to the hospital from SNF due to abdominal pain, nausea and vomiting-found to have pneumoperitoneum on imaging studies-underwent exploratory laparotomy without any findings of bowel perforation.  He was also found to be COVID-positive.  He was managed in the [facility] stability-transferred [facility].  COVID-19 vaccinated status:   Significant Events: 1/19>> Admit to [facility] for abdominal pain-pneumoperitoneum on imaging studies-exploratory laparotomy negative 1/22>> transferred to [facility] service.  Significant studies: 1/20>> CT abdomen/pelvis: Focal area of wall thickening within the adjacent perforation of the distal descending colon, free air under the right diaphragm 1/20>> chest x-ray: Hazy density at the bases from small pleural effusions by prior CT.  COVID-19 medications: None  Antibiotics: Zosyn: 1/19>>1/24  Microbiology data: None  Procedures: Exploratory laparotomy 1/20 ETT 1/20>>1/20  Consults: CCS, PCCM, nephrology  DVT prophylaxis: SCDs Start: 12/13/2020 0649    Subjective:   No major issues overnight-some diarrhea (but this is chronic).  Patient lying comfortably in bed-denies any chest pain or shortness of  breath.   Assessment  & Plan :   Abdominal pain with nausea/vomiting: Initial concern for perforated viscus-however no evidence of perforated viscus on exploratory laparotomy.  Diet being slowly advanced.  General surgery following-on Zosyn x5 days.  Hypovolemic shock: Resolved-briefly required pressors in the ICU.  Home dose midodrine and Florinef resumed.  Acute hypoxic respiratory failure due to volume overload from missing HD: Currently stable on minimal oxygen.  ESRD: On HD TTS-nephrology following.  Chronic diarrhea: No etiology in spite of extensive GI work-up in the past that any been hospital-Per last discharge summary-no more work-up  recommended.  Now having some loose stools-start as needed Imodium-on Metamucil.  Orthostatic hypotension: Chronic issue-on midodrine/Florinef.  Anemia: Secondary to CKD and acute illness-transfuse if less than 7.  Thrombocytopenia:?  Etiology-await HIT antibody-although he has COVID and could potentially cause thrombocytopenia-he does not have any active Covid infection.  If drops significantly-May need to consult hematology at some point.  No signs of bleeding-plan is to watch closely for the next few days.  PAF with RVR: Back in sinus rhythm-on metoprolol and amiodarone-no longer on anticoagulation due to recent history of GI bleeding-and now with thrombocytopenia.  Follow closely.  Chronic debility/deconditioning: At the SNF for the past few months-extremely weak-Per patient-he is minimally ambulatory for the past few months.  COVID-19 infection: Incidental finding-supportive care at this point.  Continue isolation for 10 days from 1/20.   Fever: afebrile O2 requirements:  SpO2: 97 % O2 Flow Rate (L/min): 2 L/min FiO2 (%): 40 %   COVID-19 Labs: Recent Labs    12/23/20 0909 12/24/20 0146  CRP 20.0* 18.1*    No results found for: BNP  No results for input(s): PROCALCITON in the last 168 hours.  Lab Results  Component Value Date    [place] (A) 12/14/2020   [place] NEGATIVE 12/06/2020   [place] NEGATIVE 11/22/2020   [place] NEGATIVE 11/11/2020     Nutrition Problem: Nutrition Problem: Increased nutrient needs Etiology: wound healing,catabolic illness,chronic illness Signs/Symptoms: estimated needs Interventions: MVI,Boost Melene Muller (each supplement provides 350kcal and 20 grams of protein),Refer to RD note for recommendations,Liberalize Diet   RN pressure injury documentation: On hydrotherapy per PT Pressure Injury 12/05/20 Coccyx Medial;Upper Unstageable - Full thickness tissue loss in which the base of the injury is covered by slough (yellow, tan, gray, green or brown) and/or eschar (tan, brown or black) in the wound bed. prevous stage 2 on sacrum (Active)  12/05/20 1000  Location: Coccyx  Location Orientation: Medial;Upper  Staging: Unstageable - Full thickness tissue loss in which the base of the injury is covered by slough (yellow, tan, gray, green or brown) and/or eschar (tan, brown or black) in the wound bed.  Wound Description (Comments): prevous stage 2 on sacrum, now evolved to unstageable. Black eschar covering wound bed  Present on Admission: No    ABG:    Component Value Date/Time   PHART 7.261 (L) 12/11/2020 0905   PCO2ART 30.1 (L) 12/22/2020 0905   PO2ART 149 (H) 12/14/2020 0905   HCO3 13.3 (L) 12/05/2020 0905   TCO2 13 (L) 12/21/2020 0648   ACIDBASEDEF 12.6 (H) 12/13/2020 0905   O2SAT 97.0 12/19/2020 0905    Vent Settings: N/A   Condition - Stable  Family Communication  : Daughter-[name]-[phone]-on 1/24  Code Status :  Full Code  Diet :  Diet Order            Diet renal with fluid restriction Fluid restriction: 1200 mL Fluid; Room service appropriate? Yes; Fluid consistency: Thin  Diet effective now                  Disposition Plan  :   Status is: Inpatient  Remains inpatient appropriate because:Inpatient level of care  appropriate due to severity of illness   Dispo: The patient is from: SNF              Anticipated d/c is to: SNF              Anticipated d/c date is: > 3 days  Patient currently is not medically stable to d/c.   Difficult to place patient No   Barriers to discharge: Exploratory laparotomy-slowly gaining GI function-not yet ready for discharge.  Antimicorbials  :    Anti-infectives (From admission, onward)   Start     Dose/Rate Route Frequency Ordered Stop   12/30/2020 1500  piperacillin-tazobactam (ZOSYN) IVPB 3.375 g        3.375 g 12.5 mL/hr over 240 Minutes Intravenous Every 12 hours 12/09/2020 1219     12/16/2020 1200  piperacillin-tazobactam (ZOSYN) IVPB 2.25 g  Status:  Discontinued        2.25 g 100 mL/hr over 30 Minutes Intravenous Every 8 hours 12/27/2020 0205 12/04/2020 1219   12/21/2020 0215  piperacillin-tazobactam (ZOSYN) IVPB 3.375 g        3.375 g 100 mL/hr over 30 Minutes Intravenous  Once 12/13/2020 0205 12/09/2020 0435      Inpatient Medications  Scheduled Meds: . (feeding supplement) PROSource Plus  30 mL Oral TID with meals  . amiodarone  200 mg Oral BID  . Chlorhexidine Gluconate Cloth  6 each Topical Daily  . Chlorhexidine Gluconate Cloth  6 each Topical Q0600  . Chlorhexidine Gluconate Cloth  6 each Topical Q0600  . [START ON 12/27/2020] collagenase   Topical Daily  . famotidine  20 mg Per Tube Daily  . feeding supplement  1 Container Oral TID BM  . fludrocortisone  0.2 mg Per Tube Daily  . insulin aspart  0-15 Units Subcutaneous TID WC  . insulin aspart  0-5 Units Subcutaneous QHS  . metoprolol tartrate  25 mg Oral BID  . midodrine  10 mg Per Tube TID WC  . multivitamin  1 tablet Oral QHS  . mupirocin ointment   Nasal BID  . psyllium  1 packet Oral Daily  . sodium bicarbonate  100 mEq Intravenous Once  . sodium chloride flush  10-40 mL Intracatheter Q12H  . sodium hypochlorite   Irrigation BID   Continuous Infusions: . sodium chloride 10 mL/hr  at 12/21/20 1700  . sodium chloride 10 mL/hr at 12/21/20 1700  . piperacillin-tazobactam (ZOSYN)  IV 3.375 g (12/24/20 0335)   PRN Meds:.sodium chloride, docusate, fentaNYL (SUBLIMAZE) injection, loperamide, polyethylene glycol, sodium chloride flush   Time Spent in minutes  25  See all Orders from today for further details   Oren Binet M.D on 12/24/2020 at 11:55 AM  To page go to [email or web address] - use universal password  [facility] -  Office  [phone]    Objective:   Vitals:   12/23/20 2224 12/23/20 2315 12/24/20 0344 12/24/20 0800  BP: (!) 94/52 119/69 126/67 130/73  Pulse: 72  62 60  Resp: 14 16 16 15   Temp: 98 F (36.7 C) 98.3 F (36.8 C) 99 F (37.2 C) 98.7 F (37.1 C)  TempSrc: Oral Oral Oral Oral  SpO2: 98% 97% 99% 97%  Weight:   69.2 kg   Height:        Wt Readings from Last 3 Encounters:  12/24/20 69.2 kg  12/18/20 61.2 kg  12/10/20 66.3 kg     Intake/Output Summary (Last 24 hours) at 12/24/2020 1155 Last data filed at 12/24/2020 0335 Gross per 24 hour  Intake 767 ml  Output 1111 ml  Net -344 ml     Physical Exam Gen Exam:Alert awake-not in any distress HEENT:atraumatic, normocephalic Chest: B/L clear to auscultation anteriorly CVS:S1S2 regular Abdomen:soft non tender, non distended Extremities:no edema Neurology: Non focal-but with severe generalized weakness.  Skin: no rash   Data Review:    CBC Recent Labs  Lab 12/18/20 1145 12/19/2020 0040 12/16/2020 0648 12/21/20 0523 12/23/20 0909 12/24/20 0731  WBC 7.2 15.4*  --  12.7* 6.9 4.6  HGB 8.8* 8.8* 10.5* 8.0* 8.3* 7.8*  HCT 30.0* 29.4* 31.0* 26.1* 26.1* 25.5*  PLT 120* 93*  --  74* 48* 46*  MCV 101.7* 97.7  --  95.3 94.6 94.8  MCH 29.8 29.2  --  29.2 30.1 29.0  MCHC 29.3* 29.9*  --  30.7 31.8 30.6  RDW 21.5* 20.5*  --  20.4* 20.7* 20.9*  LYMPHSABS 0.7 0.8  --   --   --   --   MONOABS 0.7 1.1*  --   --   --   --   EOSABS 0.0 0.0  --   --   --   --   BASOSABS 0.0 0.0   --   --   --   --     Chemistries  Recent Labs  Lab 12/18/20 1145 12/30/2020 0040 12/09/2020 0648 12/21/20 0523 12/23/20 0909 12/24/20 0146  NA 136 139 131* 141 137 137  K 4.0 3.4* 4.5 4.2 3.0* 3.3*  CL 102 113*  --  102 99 103  CO2 18* 11*  --  12* 21* 18*  GLUCOSE 73 66*  --  95 91 133*  BUN 54* 61*  --  85* 48* 32*  CREATININE 5.83* 5.27*  --  7.26* 4.31* 3.00*  CALCIUM 7.1* 4.9*  --  5.9* 6.7* 6.6*  AST  --  28  --   --  21 30  ALT  --  7  --   --  11 9  ALKPHOS  --  68  --   --  105 84  BILITOT  --  0.9  --   --  0.9 0.6   ------------------------------------------------------------------------------------------------------------------ No results for input(s): CHOL, HDL, LDLCALC, TRIG, CHOLHDL, LDLDIRECT in the last 72 hours.  Lab Results  Component Value Date   HGBA1C 4.1 (L) 12/21/2020   ------------------------------------------------------------------------------------------------------------------ No results for input(s): TSH, T4TOTAL, T3FREE, THYROIDAB in the last 72 hours.  Invalid input(s): FREET3 ------------------------------------------------------------------------------------------------------------------ No results for input(s): VITAMINB12, FOLATE, FERRITIN, TIBC, IRON, RETICCTPCT in the last 72 hours.  Coagulation profile No results for input(s): INR, PROTIME in the last 168 hours.  No results for input(s): DDIMER in the last 72 hours.  Cardiac Enzymes No results for input(s): CKMB, TROPONINI, MYOGLOBIN in the last 168 hours.  Invalid input(s): CK ------------------------------------------------------------------------------------------------------------------ No results found for: BNP  Micro Results Recent Results (from the past 240 hour(s))  Resp Panel by RT-PCR (Flu A&B, Covid) Nasopharyngeal Swab     Status: Abnormal   Collection Time: 12/13/2020  2:50 AM   Specimen: Nasopharyngeal Swab; Nasopharyngeal(NP) swabs in vial transport medium   Result Value Ref Range Status   SARS Coronavirus 2 by RT PCR POSITIVE (A) NEGATIVE Final    Comment: RESULT CALLED TO, READ BACK BY AND VERIFIED WITH: C YELVERTON RN 12/25/2020 0349 JDW (NOTE) SARS-CoV-2 target nucleic acids are DETECTED.  The SARS-CoV-2 RNA is generally detectable in upper respiratory specimens during the acute phase of infection. Positive results are indicative of the presence of the identified virus, but do not rule out bacterial infection or co-infection with other pathogens not detected by the test. Clinical correlation with patient history and other diagnostic information is necessary to determine patient infection status. The expected result is Negative.  Fact Sheet for Patients: EntrepreneurPulse.com.au  Fact Sheet for Healthcare Providers: IncredibleEmployment.be  This test is not yet approved or cleared by the [place] FDA and  has been authorized for detection and/or diagnosis of SARS-CoV-2 by FDA under an Emergency Use Authorization (EUA).  This EUA will remain in effect (meaning this test can be u sed) for the duration of  the COVID-19 declaration under Section 564(b)(1) of the Act, 21 U.S.C. section 360bbb-3(b)(1), unless the authorization is terminated or revoked sooner.     Influenza A by PCR NEGATIVE NEGATIVE Final   Influenza B by PCR NEGATIVE NEGATIVE Final    Comment: (NOTE) The Xpert Xpress SARS-CoV-2/FLU/RSV plus assay is intended as an aid in the diagnosis of influenza from Nasopharyngeal swab specimens and should not be used as a sole basis for treatment. Nasal washings and aspirates are unacceptable for Xpert Xpress SARS-CoV-2/FLU/RSV testing.  Fact Sheet for Patients: EntrepreneurPulse.com.au  Fact Sheet for Healthcare Providers: IncredibleEmployment.be  This test is not yet approved or cleared by the [place] FDA and has been authorized for detection  and/or diagnosis of SARS-CoV-2 by FDA under an Emergency Use Authorization (EUA). This EUA will remain in effect (meaning this test can be used) for the duration of the COVID-19 declaration under Section 564(b)(1) of the Act, 21 U.S.C. section 360bbb-3(b)(1), unless the authorization is terminated or revoked.  Performed at [facility], [place] [address] [place], [place] [address]   MRSA PCR Screening     Status: Abnormal   Collection Time: 12/21/20 11:39 PM  Result Value Ref Range Status   MRSA by PCR POSITIVE (A) NEGATIVE Final    Comment:        The GeneXpert MRSA Assay (FDA approved for NASAL specimens only), is one component of a comprehensive MRSA colonization surveillance program. It is not intended to diagnose MRSA infection nor to guide or monitor treatment for MRSA infections. RESULT CALLED TO, READ BACK BY AND VERIFIED WITH: D TUCK RN 12/22/20 0134 JDW Performed at [facility], [address], [place], [place] [address]     Radiology Reports CT Abdomen Pelvis Wo Contrast  Result Date: 12/26/2020 CLINICAL DATA:  Abdominal pain EXAM: CT ABDOMEN AND PELVIS WITHOUT CONTRAST TECHNIQUE: Multidetector CT imaging of the abdomen and pelvis was performed following the standard protocol without IV contrast. COMPARISON:  November 07, 2020 and October 22, 2020 FINDINGS: Lower chest: The visualized heart size within normal limits. No pericardial fluid/thickening. No hiatal hernia. Small bilateral pleural effusions are seen. Hepatobiliary: A small amount of pneumobilia is seen in the inferior right liver lobe and surrounding the posterior gallbladder. A distended fluid and contrast filled gallbladder is seen, likely due to vicarious contrast excretion. There is small layering gallstones. Pancreas:  Unremarkable.  No surrounding inflammatory changes. Spleen: Normal in size. Although limited due to the lack of intravenous contrast, normal in appearance. Adrenals/Urinary Tract:  Both adrenal glands appear normal. Multiple bilateral renal calculi are again identified. The largest within the upper pole of the right kidney measuring 5 mm and the largest within the left kidney measuring 4 mm within the midpole. No hydronephrosis. The bladder is decompressed with a Foley catheter. Stomach/Bowel: Small amount of contrast reflux seen within the distal esophagus. There is mildly prominent contrast filled loops of jejunum seen within the left upper quadrant without a clear transition point the remainder of the small bowel is decompressed. There is been interval increased air and stool-filled dilation of the rectum and sigmoid colon to the level of the  descending colon junction. At the level of the descending colon/sigmoid colon junction there is focal wall thickening and a small amount of free air seen anteriorly, consistent with a probable transition point and perforation. There is air seen surrounding the stool at the level of the sigmoid rectal junction which could be concerning for pneumatosis. Vascular/Lymphatic: Scattered aortic atherosclerosis is noted. Reproductive: The prostate is unremarkable. Other: There is a moderate amount of free air seen under the hemidiaphragms and is seen surrounding the porta hepatis. Musculoskeletal: Bilateral sacral decubitus ulceration seen over the inferior coccyx with debris and foci of air. There is non loculated fluid in this area. Degenerative changes seen throughout the lumbar spine most notable of L2-L3. IMPRESSION: 1. There is a focal area of wall thickening with adjacent perforation of the distal descending colon at the junction of the sigmoid colon, likely the transition point. 2. There is moderately dilated sigmoid colon and rectum which is stool-filled and suggestion of possible pneumatosis. 3. Free air seen under the right hemidiaphragm and within the porta hepatis. 4. Small amount of air seen within the inferior right liver lobe which is  concerning for portal venous gas given the patient's findings 5. Bilateral sacral decubitus ulceration with phlegmon and subcutaneous emphysema extending to the inferior coccyx. 6. These results were called by telephone at the time of interpretation on 12/04/2020 at 2:01 am to provider PA Sammy , who verbally acknowledged these results. Electronically Signed   By: Prudencio Pair M.D.   On: 12/25/2020 02:06   DG CHEST PORT 1 VIEW  Result Date: 12/18/2020 CLINICAL DATA:  Intubation EXAM: PORTABLE CHEST 1 VIEW COMPARISON:  Ten days ago FINDINGS: Endotracheal tube with tip at the clavicular heads. The enteric tube reaches the stomach with side port near the GE junction. Dialysis catheter with tips at the upper right atrium. Large volume pneumoperitoneum, known prior CT. Hazy density at the lung bases from pleural fluid primarily based on CT. IMPRESSION: 1. Enteric tube side-port is at the GE junction. Otherwise unremarkable hardware. 2. Hazy density at the bases from small pleural effusions by prior CT. 3. Pneumoperitoneum as seen on prior CT. Electronically Signed   By: Monte Fantasia M.D.   On: 12/12/2020 07:59   DG Chest Port 1 View  Result Date: 12/10/2020 CLINICAL DATA:  Status post exchange of a dialysis catheter. EXAM: PORTABLE CHEST 1 VIEW COMPARISON:  Single-view of the chest 12/07/2020. FINDINGS: New right IJ approach dialysis catheter is in place with its tip at the superior cavoatrial junction. No pneumothorax. Lungs are clear. Heart size is normal. No acute or focal bony abnormality. IMPRESSION: Dialysis catheter tip projects at the superior cavoatrial junction. Negative for pneumothorax or acute disease. Electronically Signed   By: Inge Rise M.D.   On: 12/10/2020 12:09   DG Chest Port 1 View  Result Date: 12/07/2020 CLINICAL DATA:  Post RIGHT-side dialysis catheter exchange EXAM: PORTABLE CHEST 1 VIEW COMPARISON:  Portable exam 1240 hours compared to 1003 hours FINDINGS: RIGHT jugular  dual-lumen central venous catheter with tip projecting over SVC. Normal heart size, mediastinal contours, and pulmonary vascularity. Atherosclerotic calcification aorta. Minimal chronic peribronchial thickening without pulmonary infiltrate, pleural effusion, or pneumothorax. Bones mildly demineralized. IMPRESSION: No pneumothorax following RIGHT jugular line placement. Minimal persistent bronchitic changes. Aortic Atherosclerosis (ICD10-I70.0). Electronically Signed   By: Lavonia Dana M.D.   On: 12/07/2020 12:55   DG Chest Port 1 View  Result Date: 12/06/2020 CLINICAL DATA:  End-stage renal disease. EXAM: PORTABLE CHEST 1  VIEW COMPARISON:  October 29, 2020. FINDINGS: The heart size and mediastinal contours are within normal limits. Both lungs are clear. Right internal jugular dialysis catheter is unchanged in position with distal tip in expected position of cavoatrial junction. The visualized skeletal structures are unremarkable. IMPRESSION: No active disease. Electronically Signed   By: Marijo Conception M.D.   On: 12/06/2020 10:34   DG Abd Portable 1V  Result Date: 11/28/2020 CLINICAL DATA:  Abdominal distension abdominal pain EXAM: PORTABLE ABDOMEN - 1 VIEW COMPARISON:  November 07, 2020 CT assessment, abdominal plain film from November 15, 2020 FINDINGS: Decreased distension of bowel loops in the abdomen when compared to the study from November 15, 2020. Mild distension currently of scattered gas-filled loops of small bowel. Suggestion of small amount of proximal rectal gas. No acute skeletal process on limited assessment. Signs of basilar atelectasis. IMPRESSION: Decreased distension of bowel loops in the abdomen when compared to the study from November 15, 2020. Mild distension currently of scattered gas-filled loops of small bowel may reflect mild ileus. Suggestion of small amount of proximal rectal gas. Electronically Signed   By: Zetta Bills M.D.   On: 11/28/2020 16:15   DG C-Arm 1-60 Min-No  Report  Result Date: 12/10/2020 Fluoroscopy was utilized by the requesting physician.  No radiographic interpretation.   DG C-Arm 1-60 Min-No Report  Result Date: 12/07/2020 Fluoroscopy was utilized by the requesting physician.  No radiographic interpretation.

## 2020-12-24 NOTE — Consult Note (Signed)
WOC Nurse Consult Note: Patient receiving care in [facility] [phone]. PT requesting 3 additional days of Dakin's solution bid.  This was ordered. Also, I have entered a Santyl order for when the Dakin's solution is completed later this week. Val Riles, RN, MSN, CWOCN, CNS-BC, pager [phone]
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
## 2020-12-24 NOTE — Progress Notes (Signed)
[facility] Progress Note  4 Days Post-Op  Subjective: CC-  Feeling well this morning. He only reports abdominal soreness at staples. Tolerating renal diet. Denies n/v. Loose BM x2 yesterday. WBC 4.6, afebrile  Objective: Vital signs in last 24 hours: Temp:  [98 F (36.7 C)-99 F (37.2 C)] 98.7 F (37.1 C) (01/24 0800) Pulse Rate:  [60-87] 60 (01/24 0800) Resp:  [14-21] 15 (01/24 0800) BP: (76-143)/(52-77) 130/73 (01/24 0800) SpO2:  [97 %-100 %] 97 % (01/24 0800) Weight:  [69.2 kg] 69.2 kg (01/24 0344) Last BM Date: 12/24/20  Intake/Output from previous day: 01/23 0701 - 01/24 0700 In: 1004 [P.O.:954; IV Piggyback:50] Out: 1111  Intake/Output this shift: No intake/output data recorded.  PE: Gen: Alert, NAD, pleasant HEENT: EOM's intact, pupils equal and round Pulm: rate and effort normal Abd: Soft,ND, appropriately tender, +BS,midline incision cdi with staples intact and no erythema or drainage Skin: warm and dry   Lab Results:  Recent Labs    12/23/20 0909 12/24/20 0731  WBC 6.9 4.6  HGB 8.3* 7.8*  HCT 26.1* 25.5*  PLT 48* 46*   BMET Recent Labs    12/23/20 0909 12/24/20 0146  NA 137 137  K 3.0* 3.3*  CL 99 103  CO2 21* 18*  GLUCOSE 91 133*  BUN 48* 32*  CREATININE 4.31* 3.00*  CALCIUM 6.7* 6.6*   PT/INR No results for input(s): LABPROT, INR in the last 72 hours. CMP     Component Value Date/Time   NA 137 12/24/2020 0146   K 3.3 (L) 12/24/2020 0146   CL 103 12/24/2020 0146   CO2 18 (L) 12/24/2020 0146   GLUCOSE 133 (H) 12/24/2020 0146   BUN 32 (H) 12/24/2020 0146   CREATININE 3.00 (H) 12/24/2020 0146   CALCIUM 6.6 (L) 12/24/2020 0146   PROT 4.3 (L) 12/24/2020 0146   ALBUMIN 1.7 (L) 12/24/2020 0146   AST 30 12/24/2020 0146   ALT 9 12/24/2020 0146   ALKPHOS 84 12/24/2020 0146   BILITOT 0.6 12/24/2020 0146   GFRNONAA 22 (L) 12/24/2020 0146   GFRAA 19 (L) 06/15/2018 0407   Lipase     Component Value Date/Time   LIPASE  14 12/23/2020 0040       Studies/Results: No results found.  Anti-infectives: Anti-infectives (From admission, onward)   Start     Dose/Rate Route Frequency Ordered Stop   12/10/2020 1500  piperacillin-tazobactam (ZOSYN) IVPB 3.375 g        3.375 g 12.5 mL/hr over 240 Minutes Intravenous Every 12 hours 12/30/2020 1219     12/26/2020 1200  piperacillin-tazobactam (ZOSYN) IVPB 2.25 g  Status:  Discontinued        2.25 g 100 mL/hr over 30 Minutes Intravenous Every 8 hours 12/19/2020 0205 12/12/2020 1219   12/27/2020 0215  piperacillin-tazobactam (ZOSYN) IVPB 3.375 g        3.375 g 100 mL/hr over 30 Minutes Intravenous  Once 12/12/2020 0205 12/17/2020 0435       Assessment/Plan ESRD - HD TTS DM2 Paroxysmal A fib Orthostatic hypotension Chronic diarrhea - extensive work up by GI fairly negative. colonoscopy 11/21/20 only revealedlarge ulceration involving distal sigmoid colon, path and GI panel negative at the time. Continue fiber  Pneumoperitoneum, pneumatosis, portal venous gas S/p negativeexploratory laparotomy1/20 Dr. Bobbye Morton -POD#4 - intraop: no acute findings, no contamination, excellent blood flow to intestine,congested rectosigmoid colon - Tolerating renal diet and having bowel function. WBC has normalized. He will complete antibiotics from surgical standpoint today. Ok to discharge  to SNF. I will place follow up for staple removal and surgical follow up on AVS. We will sign off, please call with concerns.  ID -zosyn 1/20>>day#5/5 FEN - renal diet, Boost VTE -SCDs, sq heparin Foley -none   LOS: 4 days    Wellington Hampshire, [facility] 12/24/2020, 9:59 AM Please see Amion for pager number during day hours 7:00am-4:30pm

## 2020-12-25 DIAGNOSIS — N186 End stage renal disease: Secondary | ICD-10-CM | POA: Diagnosis not present

## 2020-12-25 DIAGNOSIS — K668 Other specified disorders of peritoneum: Secondary | ICD-10-CM | POA: Diagnosis not present

## 2020-12-25 DIAGNOSIS — I480 Paroxysmal atrial fibrillation: Secondary | ICD-10-CM | POA: Diagnosis not present

## 2020-12-25 LAB — GLUCOSE, CAPILLARY
Glucose-Capillary: 97 mg/dL (ref 70–99)
Glucose-Capillary: 118 mg/dL — ABNORMAL HIGH (ref 70–99)
Glucose-Capillary: 133 mg/dL — ABNORMAL HIGH (ref 70–99)
Glucose-Capillary: 126 mg/dL — ABNORMAL HIGH (ref 70–99)

## 2020-12-25 LAB — CBC
HCT: 24.4 % — ABNORMAL LOW (ref 39.0–52.0)
Hemoglobin: 7.8 g/dL — ABNORMAL LOW (ref 13.0–17.0)
MCH: 29.8 pg (ref 26.0–34.0)
MCHC: 32 g/dL (ref 30.0–36.0)
MCV: 93.1 fL (ref 80.0–100.0)
Platelets: 59 10*3/uL — ABNORMAL LOW (ref 150–400)
RBC: 2.62 MIL/uL — ABNORMAL LOW (ref 4.22–5.81)
RDW: 20.7 % — ABNORMAL HIGH (ref 11.5–15.5)
WBC: 4.4 10*3/uL (ref 4.0–10.5)
nRBC: 0 % (ref 0.0–0.2)

## 2020-12-25 LAB — COMPREHENSIVE METABOLIC PANEL
ALT: 11 U/L (ref 0–44)
AST: 19 U/L (ref 15–41)
Albumin: 1.7 g/dL — ABNORMAL LOW (ref 3.5–5.0)
Alkaline Phosphatase: 112 U/L (ref 38–126)
Anion gap: 16 — ABNORMAL HIGH (ref 5–15)
BUN: 47 mg/dL — ABNORMAL HIGH (ref 8–23)
CO2: 20 mmol/L — ABNORMAL LOW (ref 22–32)
Calcium: 6.9 mg/dL — ABNORMAL LOW (ref 8.9–10.3)
Chloride: 98 mmol/L (ref 98–111)
Creatinine, Ser: 3.91 mg/dL — ABNORMAL HIGH (ref 0.61–1.24)
GFR, Estimated: 16 mL/min — ABNORMAL LOW (ref >60)
Glucose, Bld: 155 mg/dL — ABNORMAL HIGH (ref 70–99)
Potassium: 2.6 mmol/L — CL (ref 3.5–5.1)
Sodium: 134 mmol/L — ABNORMAL LOW (ref 135–145)
Total Bilirubin: 0.9 mg/dL (ref 0.3–1.2)
Total Protein: 4.7 g/dL — ABNORMAL LOW (ref 6.5–8.1)

## 2020-12-25 LAB — C-REACTIVE PROTEIN: CRP: 19.4 mg/dL — ABNORMAL HIGH (ref <1.0)

## 2020-12-25 LAB — HEPARIN INDUCED PLATELET AB (HIT ANTIBODY): Heparin Induced Plt Ab: 0.117 OD (ref 0.000–0.400)

## 2020-12-25 LAB — ZINC: Zinc: 42 ug/dL — ABNORMAL LOW (ref 44–115)

## 2020-12-25 LAB — VITAMIN C: Vitamin C: <0.1 mg/dL — ABNORMAL LOW (ref 0.4–2.0)

## 2020-12-25 MED ORDER — MIDODRINE HCL 5 MG PO TABS
10.0000 mg | ORAL_TABLET | Freq: Three times a day (TID) | ORAL | Status: DC
  Administered 2020-12-25 – 2020-12-31 (×19): 10 mg via ORAL
  Filled 2020-12-25 (×20): qty 2

## 2020-12-25 MED ORDER — POLYETHYLENE GLYCOL 3350 17 G PO PACK: 17.0000 g | PACK | Freq: Every day | ORAL | Status: DC | PRN

## 2020-12-25 MED ORDER — DOCUSATE SODIUM 50 MG/5ML PO LIQD: 100.0000 mg | Freq: Two times a day (BID) | ORAL | Status: DC | PRN

## 2020-12-25 MED ORDER — FAMOTIDINE 20 MG PO TABS
20.0000 mg | ORAL_TABLET | Freq: Every day | ORAL | Status: DC
  Administered 2020-12-25 – 2020-12-31 (×7): 20 mg via ORAL
  Filled 2020-12-25 (×7): qty 1

## 2020-12-25 MED ORDER — FLUDROCORTISONE ACETATE 0.1 MG PO TABS
0.2000 mg | ORAL_TABLET | Freq: Every day | ORAL | Status: DC
Start: 2020-12-25 — End: 2020-12-29
  Administered 2020-12-25 – 2020-12-29 (×5): 0.2 mg via ORAL
  Filled 2020-12-25 (×5): qty 2

## 2020-12-25 MED ORDER — POTASSIUM CHLORIDE 20 MEQ PO PACK
40.0000 meq | PACK | ORAL | Status: AC
  Administered 2020-12-25 (×2): 40 meq via ORAL
  Filled 2020-12-25 (×2): qty 2

## 2020-12-25 MED ORDER — POTASSIUM CHLORIDE 20 MEQ PO PACK
40.0000 meq | PACK | Freq: Two times a day (BID) | ORAL | Status: DC
  Administered 2020-12-25: 40 meq via ORAL
  Filled 2020-12-25: qty 2

## 2020-12-25 NOTE — Progress Notes (Signed)
PROGRESS NOTE                                                                                                                                                                                                             Patient Demographics:    [name], is a 70 y.o. male, DOB - 05-Jan-1951, ZYS:[id number]  Outpatient Primary MD for the patient is Patient, No Pcp Per   Admit date - 12/11/2020   LOS - 5  Chief Complaint  Patient presents with  . Vomiting       Brief Narrative: Patient is a 70 y.o. male with PMHx of ESRD, chronic diarrhea (with extensive work-up in the past), orthostatic hypotension, PAF-anticoagulation on hold due to history of GI bleeding, -presented to the hospital from SNF due to abdominal pain, nausea and vomiting-found to have pneumoperitoneum on imaging studies-underwent exploratory laparotomy without any findings of bowel perforation.  He was also found to be COVID-positive.  He was managed in the [facility]-transfer[facility] to the [facility].  COVID-19 vaccinated status:   Significant Events: 1/19>> Admit to [facility] for abdominal pain-pneumoperitoneum on imaging studies-exploratory laparotomy negative 1/22>> transferred to [facility] service.  Significant studies: 1/20>> CT abdomen/pelvis: Focal area of wall thickening within the adjacent perforation of the distal descending colon, free air under the right diaphragm 1/20>> chest x-ray: Hazy density at the bases from small pleural effusions by prior CT.  COVID-19 medications: None  Antibiotics: Zosyn: 1/19>>1/24  Microbiology data: None  Procedures: Exploratory laparotomy 1/20 ETT 1/20>>1/20  Consults: CCS, PCCM, nephrology  DVT prophylaxis: SCDs Start: 12/16/2020 0649    Subjective:   No major issues overnight-Per nursing staff and patient still getting more formed-patient wants Imodium every day.   Assessment  & Plan :    Abdominal pain with nausea/vomiting: Initial concern for perforated viscus-however no evidence of perforated viscus on exploratory laparotomy.  Has tolerated advancement in diet.  No further recommendations by general surgery.  Has finished 5 days of IV Zosyn.  Hypovolemic shock: Resolved-briefly required pressors in the ICU.  Home dose midodrine and Florinef resumed.  Acute hypoxic respiratory failure due to volume overload from missing HD: Currently stable on minimal oxygen.  ESRD: On HD TTS-nephrology following.  Chronic diarrhea: No etiology in spite of extensive GI work-up in the past that any been hospital-Per last discharge  summary-no more work-up recommended.  Now having some loose stools-start as needed Imodium-on Metamucil.  Orthostatic hypotension: Chronic issue-on midodrine/Florinef.  Hypokalemia: Due to Florinef and diarrhea-replete and recheck.  Anemia: Secondary to CKD and acute illness-transfuse if less than 7.  Thrombocytopenia:?  Etiology-await HIT antibody-although he has COVID and could potentially cause thrombocytopenia-he does not have any active Covid infection.  Plans are to continue to observe-thankfully platelet count improving.  Follow closely-no signs of bleeding.  PAF with RVR: Back in sinus rhythm-on metoprolol and amiodarone-no longer on anticoagulation due to recent history of GI bleeding-and now with thrombocytopenia.  Follow closely.  Chronic debility/deconditioning: At the SNF for the past few months-extremely weak-Per patient-he is minimally ambulatory for the past few months.  COVID-19 infection: Incidental finding-supportive care at this point.  Continue isolation for 10 days from 1/20.   Fever: afebrile O2 requirements:  SpO2: 100 % O2 Flow Rate (L/min): 2 L/min FiO2 (%): 40 %   COVID-19 Labs: Recent Labs    12/23/20 0909 12/24/20 0146 12/25/20 0039  CRP 20.0* 18.1* 19.4*    No results found for: BNP  No results for input(s):  PROCALCITON in the last 168 hours.  Lab Results  Component Value Date   [place] (A) 12/24/2020   [place] NEGATIVE 12/06/2020   [place] NEGATIVE 11/22/2020   [place] NEGATIVE 11/11/2020     Nutrition Problem: Nutrition Problem: Increased nutrient needs Etiology: wound healing,catabolic illness,chronic illness Signs/Symptoms: estimated needs Interventions: MVI,Boost Melene Muller (each supplement provides 350kcal and 20 grams of protein),Refer to RD note for recommendations,Liberalize Diet   RN pressure injury documentation: On hydrotherapy per PT Pressure Injury 12/05/20 Coccyx Medial;Upper Unstageable - Full thickness tissue loss in which the base of the injury is covered by slough (yellow, tan, gray, green or brown) and/or eschar (tan, brown or black) in the wound bed. prevous stage 2 on sacrum (Active)  12/05/20 1000  Location: Coccyx  Location Orientation: Medial;Upper  Staging: Unstageable - Full thickness tissue loss in which the base of the injury is covered by slough (yellow, tan, gray, green or brown) and/or eschar (tan, brown or black) in the wound bed.  Wound Description (Comments): prevous stage 2 on sacrum, now evolved to unstageable. Black eschar covering wound bed  Present on Admission: No    ABG:    Component Value Date/Time   PHART 7.261 (L) 12/09/2020 0905   PCO2ART 30.1 (L) 12/21/2020 0905   PO2ART 149 (H) 12/26/2020 0905   HCO3 13.3 (L) 12/06/2020 0905   TCO2 13 (L) 12/14/2020 0648   ACIDBASEDEF 12.6 (H) 12/19/2020 0905   O2SAT 97.0 12/14/2020 0905    Vent Settings: N/A   Condition - Stable  Family Communication  : Daughter-[name]-[phone]-on 1/25  Code Status :  Full Code  Diet :  Diet Order            Diet renal with fluid restriction Fluid restriction: 1200 mL Fluid; Room service appropriate? Yes; Fluid consistency: Thin  Diet effective now                  Disposition Plan  :   Status is:  Inpatient  Remains inpatient appropriate because:Inpatient level of care appropriate due to severity of illness   Dispo: The patient is from: SNF              Anticipated d/c is to: SNF              Anticipated d/c date is: > 3 days  Patient currently is not medically stable to d/c.   Difficult to place patient No   Barriers to discharge: Exploratory laparotomy-slowly gaining GI function-not yet ready for discharge.  Antimicorbials  :    Anti-infectives (From admission, onward)   Start     Dose/Rate Route Frequency Ordered Stop   12/26/2020 1500  piperacillin-tazobactam (ZOSYN) IVPB 3.375 g        3.375 g 12.5 mL/hr over 240 Minutes Intravenous Every 12 hours 12/09/2020 1219 12/24/20 2001   12/31/2020 1200  piperacillin-tazobactam (ZOSYN) IVPB 2.25 g  Status:  Discontinued        2.25 g 100 mL/hr over 30 Minutes Intravenous Every 8 hours 12/08/2020 0205 12/09/2020 1219   12/28/2020 0215  piperacillin-tazobactam (ZOSYN) IVPB 3.375 g        3.375 g 100 mL/hr over 30 Minutes Intravenous  Once 12/03/2020 0205 12/02/2020 0435      Inpatient Medications  Scheduled Meds: . (feeding supplement) PROSource Plus  30 mL Oral TID with meals  . amiodarone  200 mg Oral BID  . Chlorhexidine Gluconate Cloth  6 each Topical Q0600  . [START ON 12/27/2020] collagenase   Topical Daily  . famotidine  20 mg Oral Daily  . feeding supplement  1 Container Oral TID BM  . fludrocortisone  0.2 mg Oral Daily  . insulin aspart  0-15 Units Subcutaneous TID WC  . insulin aspart  0-5 Units Subcutaneous QHS  . metoprolol tartrate  25 mg Oral BID  . midodrine  10 mg Oral TID WC  . multivitamin  1 tablet Oral QHS  . mupirocin ointment   Nasal BID  . potassium chloride  40 mEq Oral Q4H  . psyllium  1 packet Oral Daily  . sodium bicarbonate  100 mEq Intravenous Once  . sodium chloride flush  10-40 mL Intracatheter Q12H  . sodium hypochlorite   Irrigation BID   Continuous Infusions: . sodium chloride 10  mL/hr at 12/21/20 1700  . sodium chloride 10 mL/hr at 12/21/20 1700   PRN Meds:.sodium chloride, docusate, fentaNYL (SUBLIMAZE) injection, loperamide, polyethylene glycol, sodium chloride flush   Time Spent in minutes  25  See all Orders from today for further details   Oren Binet M.D on 12/25/2020 at 12:24 PM  To page go to [email or web address] - use universal password  [facility] -  Office  [phone]    Objective:   Vitals:   12/24/20 2127 12/25/20 0441 12/25/20 0800 12/25/20 1214  BP: (!) 142/69 (!) 143/91 127/72 131/74  Pulse:  (!) 35 64 65  Resp:  18 17 17   Temp:  98.3 F (36.8 C) 98 F (36.7 C) 98.6 F (37 C)  TempSrc:  Oral Oral Oral  SpO2:  99% 99% 100%  Weight:      Height:        Wt Readings from Last 3 Encounters:  12/24/20 69.2 kg  12/18/20 61.2 kg  12/10/20 66.3 kg     Intake/Output Summary (Last 24 hours) at 12/25/2020 1224 Last data filed at 12/24/2020 2100 Gross per 24 hour  Intake 487 ml  Output --  Net 487 ml     Physical Exam Gen Exam:Alert awake-not in any distress HEENT:atraumatic, normocephalic Chest: B/L clear to auscultation anteriorly CVS:S1S2 regular Abdomen:soft non tender, non distended Extremities:no edema Neurology: Non focal-but with generalized weakness. Skin: no rash   Data Review:    CBC Recent Labs  Lab 12/16/2020 0040 12/19/2020 3532 12/21/20 9924 12/23/20 2683 12/24/20 0731 12/25/20 0039  WBC 15.4*  --  12.7* 6.9 4.6 4.4  HGB 8.8* 10.5* 8.0* 8.3* 7.8* 7.8*  HCT 29.4* 31.0* 26.1* 26.1* 25.5* 24.4*  PLT 93*  --  74* 48* 46* 59*  MCV 97.7  --  95.3 94.6 94.8 93.1  MCH 29.2  --  29.2 30.1 29.0 29.8  MCHC 29.9*  --  30.7 31.8 30.6 32.0  RDW 20.5*  --  20.4* 20.7* 20.9* 20.7*  LYMPHSABS 0.8  --   --   --   --   --   MONOABS 1.1*  --   --   --   --   --   EOSABS 0.0  --   --   --   --   --   BASOSABS 0.0  --   --   --   --   --     Chemistries  Recent Labs  Lab 12/06/2020 0040 12/23/2020 0648  12/21/20 0523 12/23/20 0909 12/24/20 0146 12/25/20 0039  NA 139 131* 141 137 137 134*  K 3.4* 4.5 4.2 3.0* 3.3* 2.6*  CL 113*  --  102 99 103 98  CO2 11*  --  12* 21* 18* 20*  GLUCOSE 66*  --  95 91 133* 155*  BUN 61*  --  85* 48* 32* 47*  CREATININE 5.27*  --  7.26* 4.31* 3.00* 3.91*  CALCIUM 4.9*  --  5.9* 6.7* 6.6* 6.9*  AST 28  --   --  21 30 19   ALT 7  --   --  11 9 11   ALKPHOS 68  --   --  105 84 112  BILITOT 0.9  --   --  0.9 0.6 0.9   ------------------------------------------------------------------------------------------------------------------ No results for input(s): CHOL, HDL, LDLCALC, TRIG, CHOLHDL, LDLDIRECT in the last 72 hours.  Lab Results  Component Value Date   HGBA1C 4.1 (L) 12/21/2020   ------------------------------------------------------------------------------------------------------------------ No results for input(s): TSH, T4TOTAL, T3FREE, THYROIDAB in the last 72 hours.  Invalid input(s): FREET3 ------------------------------------------------------------------------------------------------------------------ No results for input(s): VITAMINB12, FOLATE, FERRITIN, TIBC, IRON, RETICCTPCT in the last 72 hours.  Coagulation profile No results for input(s): INR, PROTIME in the last 168 hours.  No results for input(s): DDIMER in the last 72 hours.  Cardiac Enzymes No results for input(s): CKMB, TROPONINI, MYOGLOBIN in the last 168 hours.  Invalid input(s): CK ------------------------------------------------------------------------------------------------------------------ No results found for: BNP  Micro Results Recent Results (from the past 240 hour(s))  Resp Panel by RT-PCR (Flu A&B, Covid) Nasopharyngeal Swab     Status: Abnormal   Collection Time: 12/16/2020  2:50 AM   Specimen: Nasopharyngeal Swab; Nasopharyngeal(NP) swabs in vial transport medium  Result Value Ref Range Status   SARS Coronavirus 2 by RT PCR POSITIVE (A) NEGATIVE Final     Comment: RESULT CALLED TO, READ BACK BY AND VERIFIED WITH: C YELVERTON RN 12/19/2020 0349 JDW (NOTE) SARS-CoV-2 target nucleic acids are DETECTED.  The SARS-CoV-2 RNA is generally detectable in upper respiratory specimens during the acute phase of infection. Positive results are indicative of the presence of the identified virus, but do not rule out bacterial infection or co-infection with other pathogens not detected by the test. Clinical correlation with patient history and other diagnostic information is necessary to determine patient infection status. The expected result is Negative.  Fact Sheet for Patients: EntrepreneurPulse.com.au  Fact Sheet for Healthcare Providers: IncredibleEmployment.be  This test is not yet approved or cleared by the [place] FDA and  has been authorized for detection  and/or diagnosis of SARS-CoV-2 by FDA under an Emergency Use Authorization (EUA).  This EUA will remain in effect (meaning this test can be u sed) for the duration of  the COVID-19 declaration under Section 564(b)(1) of the Act, 21 U.S.C. section 360bbb-3(b)(1), unless the authorization is terminated or revoked sooner.     Influenza A by PCR NEGATIVE NEGATIVE Final   Influenza B by PCR NEGATIVE NEGATIVE Final    Comment: (NOTE) The Xpert Xpress SARS-CoV-2/FLU/RSV plus assay is intended as an aid in the diagnosis of influenza from Nasopharyngeal swab specimens and should not be used as a sole basis for treatment. Nasal washings and aspirates are unacceptable for Xpert Xpress SARS-CoV-2/FLU/RSV testing.  Fact Sheet for Patients: EntrepreneurPulse.com.au  Fact Sheet for Healthcare Providers: IncredibleEmployment.be  This test is not yet approved or cleared by the [place] FDA and has been authorized for detection and/or diagnosis of SARS-CoV-2 by FDA under an Emergency Use Authorization (EUA). This EUA  will remain in effect (meaning this test can be used) for the duration of the COVID-19 declaration under Section 564(b)(1) of the Act, 21 U.S.C. section 360bbb-3(b)(1), unless the authorization is terminated or revoked.  Performed at [facility], [address] [place], [place] [address]   MRSA PCR Screening     Status: Abnormal   Collection Time: 12/21/20 11:39 PM  Result Value Ref Range Status   MRSA by PCR POSITIVE (A) NEGATIVE Final    Comment:        The GeneXpert MRSA Assay (FDA approved for NASAL specimens only), is one component of a comprehensive MRSA colonization surveillance program. It is not intended to diagnose MRSA infection nor to guide or monitor treatment for MRSA infections. RESULT CALLED TO, READ BACK BY AND VERIFIED WITH: D TUCK RN 12/22/20 0134 JDW Performed at [facility], [address] [place], [place] [address]     Radiology Reports CT Abdomen Pelvis Wo Contrast  Result Date: 12/28/2020 CLINICAL DATA:  Abdominal pain EXAM: CT ABDOMEN AND PELVIS WITHOUT CONTRAST TECHNIQUE: Multidetector CT imaging of the abdomen and pelvis was performed following the standard protocol without IV contrast. COMPARISON:  November 07, 2020 and October 22, 2020 FINDINGS: Lower chest: The visualized heart size within normal limits. No pericardial fluid/thickening. No hiatal hernia. Small bilateral pleural effusions are seen. Hepatobiliary: A small amount of pneumobilia is seen in the inferior right liver lobe and surrounding the posterior gallbladder. A distended fluid and contrast filled gallbladder is seen, likely due to vicarious contrast excretion. There is small layering gallstones. Pancreas:  Unremarkable.  No surrounding inflammatory changes. Spleen: Normal in size. Although limited due to the lack of intravenous contrast, normal in appearance. Adrenals/Urinary Tract: Both adrenal glands appear normal. Multiple bilateral renal calculi are again identified. The  largest within the upper pole of the right kidney measuring 5 mm and the largest within the left kidney measuring 4 mm within the midpole. No hydronephrosis. The bladder is decompressed with a Foley catheter. Stomach/Bowel: Small amount of contrast reflux seen within the distal esophagus. There is mildly prominent contrast filled loops of jejunum seen within the left upper quadrant without a clear transition point the remainder of the small bowel is decompressed. There is been interval increased air and stool-filled dilation of the rectum and sigmoid colon to the level of the descending colon junction. At the level of the descending colon/sigmoid colon junction there is focal wall thickening and a small amount of free air seen anteriorly, consistent  with a probable transition point and perforation. There is air seen surrounding the stool at the level of the sigmoid rectal junction which could be concerning for pneumatosis. Vascular/Lymphatic: Scattered aortic atherosclerosis is noted. Reproductive: The prostate is unremarkable. Other: There is a moderate amount of free air seen under the hemidiaphragms and is seen surrounding the porta hepatis. Musculoskeletal: Bilateral sacral decubitus ulceration seen over the inferior coccyx with debris and foci of air. There is non loculated fluid in this area. Degenerative changes seen throughout the lumbar spine most notable of L2-L3. IMPRESSION: 1. There is a focal area of wall thickening with adjacent perforation of the distal descending colon at the junction of the sigmoid colon, likely the transition point. 2. There is moderately dilated sigmoid colon and rectum which is stool-filled and suggestion of possible pneumatosis. 3. Free air seen under the right hemidiaphragm and within the porta hepatis. 4. Small amount of air seen within the inferior right liver lobe which is concerning for portal venous gas given the patient's findings 5. Bilateral sacral decubitus ulceration  with phlegmon and subcutaneous emphysema extending to the inferior coccyx. 6. These results were called by telephone at the time of interpretation on 12/11/2020 at 2:01 am to provider PA Sammy , who verbally acknowledged these results. Electronically Signed   By: Prudencio Pair M.D.   On: 12/19/2020 02:06   DG CHEST PORT 1 VIEW  Result Date: 12/09/2020 CLINICAL DATA:  Intubation EXAM: PORTABLE CHEST 1 VIEW COMPARISON:  Ten days ago FINDINGS: Endotracheal tube with tip at the clavicular heads. The enteric tube reaches the stomach with side port near the GE junction. Dialysis catheter with tips at the upper right atrium. Large volume pneumoperitoneum, known prior CT. Hazy density at the lung bases from pleural fluid primarily based on CT. IMPRESSION: 1. Enteric tube side-port is at the GE junction. Otherwise unremarkable hardware. 2. Hazy density at the bases from small pleural effusions by prior CT. 3. Pneumoperitoneum as seen on prior CT. Electronically Signed   By: Monte Fantasia M.D.   On: 12/13/2020 07:59   DG Chest Port 1 View  Result Date: 12/10/2020 CLINICAL DATA:  Status post exchange of a dialysis catheter. EXAM: PORTABLE CHEST 1 VIEW COMPARISON:  Single-view of the chest 12/07/2020. FINDINGS: New right IJ approach dialysis catheter is in place with its tip at the superior cavoatrial junction. No pneumothorax. Lungs are clear. Heart size is normal. No acute or focal bony abnormality. IMPRESSION: Dialysis catheter tip projects at the superior cavoatrial junction. Negative for pneumothorax or acute disease. Electronically Signed   By: Inge Rise M.D.   On: 12/10/2020 12:09   DG Chest Port 1 View  Result Date: 12/07/2020 CLINICAL DATA:  Post RIGHT-side dialysis catheter exchange EXAM: PORTABLE CHEST 1 VIEW COMPARISON:  Portable exam 1240 hours compared to 1003 hours FINDINGS: RIGHT jugular dual-lumen central venous catheter with tip projecting over SVC. Normal heart size, mediastinal contours,  and pulmonary vascularity. Atherosclerotic calcification aorta. Minimal chronic peribronchial thickening without pulmonary infiltrate, pleural effusion, or pneumothorax. Bones mildly demineralized. IMPRESSION: No pneumothorax following RIGHT jugular line placement. Minimal persistent bronchitic changes. Aortic Atherosclerosis (ICD10-I70.0). Electronically Signed   By: Lavonia Dana M.D.   On: 12/07/2020 12:55   DG Chest Port 1 View  Result Date: 12/06/2020 CLINICAL DATA:  End-stage renal disease. EXAM: PORTABLE CHEST 1 VIEW COMPARISON:  October 29, 2020. FINDINGS: The heart size and mediastinal contours are within normal limits. Both lungs are clear. Right internal jugular dialysis catheter is  unchanged in position with distal tip in expected position of cavoatrial junction. The visualized skeletal structures are unremarkable. IMPRESSION: No active disease. Electronically Signed   By: Marijo Conception M.D.   On: 12/06/2020 10:34   DG Abd Portable 1V  Result Date: 11/28/2020 CLINICAL DATA:  Abdominal distension abdominal pain EXAM: PORTABLE ABDOMEN - 1 VIEW COMPARISON:  November 07, 2020 CT assessment, abdominal plain film from November 15, 2020 FINDINGS: Decreased distension of bowel loops in the abdomen when compared to the study from November 15, 2020. Mild distension currently of scattered gas-filled loops of small bowel. Suggestion of small amount of proximal rectal gas. No acute skeletal process on limited assessment. Signs of basilar atelectasis. IMPRESSION: Decreased distension of bowel loops in the abdomen when compared to the study from November 15, 2020. Mild distension currently of scattered gas-filled loops of small bowel may reflect mild ileus. Suggestion of small amount of proximal rectal gas. Electronically Signed   By: Zetta Bills M.D.   On: 11/28/2020 16:15   DG C-Arm 1-60 Min-No Report  Result Date: 12/10/2020 Fluoroscopy was utilized by the requesting physician.  No radiographic  interpretation.   DG C-Arm 1-60 Min-No Report  Result Date: 12/07/2020 Fluoroscopy was utilized by the requesting physician.  No radiographic interpretation.

## 2020-12-25 NOTE — NC FL2 (Addendum)
[place] LEVEL OF CARE SCREENING TOOL     IDENTIFICATION  Patient Name: [name] Birthdate: 1951/07/08 Sex: male Admission Date (Current Location): 12/17/2020  [facility] and [place] Number:  [employer] and Address:  [facility] [place]. [facility], [place] [address] [place], [place] [address]      Provider Number: [id number]  Attending Physician Name and Address:  Jonetta Osgood, MD  Relative Name and Phone Number:  Marikay Alar [phone]    Current Level of Care: Hospital Recommended Level of Care: [place] Prior Approval Number:    Date Approved/Denied:   PASRR Number: [id number]  Discharge Plan: SNF    Current Diagnoses: Patient Active Problem List   Diagnosis Date Noted  . Pneumoperitoneum 12/15/2020  . Perforated sigmoid colon ([place]) 12/10/2020  . Perforated bowel ([place]) 12/09/2020  . Problem with dialysis access ([place])   . Complication of vascular dialysis catheter   . Goals of care, counseling/discussion 11/29/2020  . Abdominal distension   . Colitis   . ESRD on hemodialysis ([place])   . Syncope due to orthostatic hypotension 11/02/2020  . Rectal bleeding   . Acute on chronic anemia   . Diarrhea   . Orthostatic hypotension 10/13/2020  . Chronic diarrhea 10/13/2020  . Colitis presumed infectious 10/13/2020  . Pressure injury of skin 10/13/2020  . ESRD on dialysis ([place]) 10/12/2020  . Unspecified atrial fibrillation ([place]) 10/12/2020  . Syncope 10/12/2020  . Small bowel obstruction ([place])   . Type 2 diabetes mellitus ([place]) 06/11/2018  . Peripheral neuropathy 06/11/2018  . Ankle syndesmosis disruption, right, initial encounter 06/11/2018  . AKI (acute kidney injury) ([place])   . Dehydration   . Open right ankle fracture 06/08/2018  . Fracture, ankle 06/08/2018  . Open displaced fracture of medial malleolus of tibia, type III 06/08/2018    Orientation RESPIRATION BLADDER Height & Weight      Self,Time,Situation,Place  Normal Incontinent Weight: 152 lb 8.9 oz (69.2 kg) Height:  6\' 2"  (188 cm)  BEHAVIORAL SYMPTOMS/MOOD NEUROLOGICAL BOWEL NUTRITION STATUS      Incontinent (rectal pouch) Diet (Please see DC Summary)  AMBULATORY STATUS COMMUNICATION OF NEEDS Skin   Extensive Assist Verbally PU Stage and Appropriate Care (Unstageable on coccyx; closed incision on abdomen; wound on toe)                       Personal Care Assistance Level of Assistance  Bathing,Feeding,Dressing Bathing Assistance: Maximum assistance Feeding assistance: Limited assistance Dressing Assistance: Maximum assistance     Functional Limitations Info  Sight,Hearing,Speech Sight Info: Adequate Hearing Info: Impaired Speech Info: Adequate    SPECIAL CARE FACTORS FREQUENCY  PT (By licensed PT)     PT Frequency: 5x/week              Contractures Contractures Info: Not present    Additional Factors Info  Code Status,Allergies,Isolation Precautions,Insulin Sliding Scale Code Status Info: Full Allergies Info: NKA   Insulin Sliding Scale Info: See dc summary Isolation Precautions Info: COVID+ 12/07/2020     Current Medications (12/25/2020):  This is the current hospital active medication list Current Facility-Administered Medications  Medication Dose Route Frequency Provider Last Rate Last Admin  . (feeding supplement) PROSource Plus liquid 30 mL  30 mL Oral TID with meals Hunsucker, Bonna Gains, MD   30 mL at 12/25/20 1228  . 0.9 %  sodium chloride infusion  250 mL Intravenous Continuous Rigoberto Noel, MD 10 mL/hr  at 12/21/20 1700 Infusion Verify at 12/21/20 1700  . 0.9 %  sodium chloride infusion   Intravenous PRN Hunsucker, Bonna Gains, MD 10 mL/hr at 12/21/20 1700 Infusion Verify at 12/21/20 1700  . amiodarone (PACERONE) tablet 200 mg  200 mg Oral BID Jonetta Osgood, MD   200 mg at 12/25/20 0851  . Chlorhexidine Gluconate Cloth 2 % PADS 6 each  6 each Topical Q0600 Claudia Desanctis, MD    6 each at 12/25/20 [phone]  . [START ON 12/27/2020] collagenase (SANTYL) ointment   Topical Daily Ghimire, Henreitta Leber, MD      . docusate (COLACE) 50 MG/5ML liquid 100 mg  100 mg Oral BID PRN Theotis Burrow, RPH      . famotidine (PEPCID) tablet 20 mg  20 mg Oral Daily Joselyn Glassman A, RPH   20 mg at 12/25/20 0851  . feeding supplement (BOOST / RESOURCE BREEZE) liquid 1 Container  1 Container Oral TID BM Hunsucker, Bonna Gains, MD   1 Container at 12/25/20 [phone]  . fentaNYL (SUBLIMAZE) injection 50 mcg  50 mcg Intravenous Q6H PRN Jacky Kindle, MD   50 mcg at 12/22/20 1626  . fludrocortisone (FLORINEF) tablet 0.2 mg  0.2 mg Oral Daily Joselyn Glassman A, RPH   0.2 mg at 12/25/20 0850  . insulin aspart (novoLOG) injection 0-15 Units  0-15 Units Subcutaneous TID WC Hunsucker, Bonna Gains, MD   2 Units at 12/24/20 1753  . insulin aspart (novoLOG) injection 0-5 Units  0-5 Units Subcutaneous QHS Hunsucker, Bonna Gains, MD      . loperamide (IMODIUM) capsule 2 mg  2 mg Oral PRN Jonetta Osgood, MD   2 mg at 12/25/20 1228  . metoprolol tartrate (LOPRESSOR) tablet 25 mg  25 mg Oral BID Jonetta Osgood, MD   25 mg at 12/25/20 0851  . midodrine (PROAMATINE) tablet 10 mg  10 mg Oral TID WC Pierce, Dwayne A, RPH   10 mg at 12/25/20 1228  . multivitamin (RENA-VIT) tablet 1 tablet  1 tablet Oral QHS Hunsucker, Bonna Gains, MD   1 tablet at 12/24/20 2111  . mupirocin ointment (BACTROBAN) 2 %   Nasal BID Mansy, Arvella Merles, MD   Given at 12/25/20 [phone]  . polyethylene glycol (MIRALAX / GLYCOLAX) packet 17 g  17 g Oral Daily PRN Joselyn Glassman A, RPH      . psyllium (HYDROCIL/METAMUCIL) 1 packet  1 packet Oral Daily Meuth, Brooke A, PA-C   1 packet at 12/25/20 [phone]  . sodium bicarbonate injection 100 mEq  100 mEq Intravenous Once Ellender, Ryan P, MD      . sodium chloride flush (NS) 0.9 % injection 10-40 mL  10-40 mL Intracatheter Q12H Jacky Kindle, MD   10 mL at 12/25/20 0857  . sodium chloride flush (NS) 0.9 % injection  10-40 mL  10-40 mL Intracatheter PRN Jacky Kindle, MD      . sodium hypochlorite (DAKIN'S 1/4 STRENGTH) topical solution   Irrigation BID Jonetta Osgood, MD   Given at 12/25/20 1230     Discharge Medications: Please see discharge summary for a list of discharge medications.  Relevant Imaging Results:  Relevant Lab Results:   Additional Information Pt SSN: [id number].  Patient will go to COVID dialysis shift at [place]: MWF [place], LCSW

## 2020-12-25 NOTE — Evaluation (Signed)
Occupational Therapy Evaluation Patient Details Name: [name] MRN: [id number] DOB: 1951-02-05 Today's Date: 12/25/2020    History of Present Illness 69 y.o. male with PMHx of ESRD, TThS chronic diarrhea (with extensive work-up in the past), orthostatic hypotension, PAF-anticoagulation on hold due to history of GI bleeding, -presented to the hospital from SNF [facility]) 12/18/20 due to PTAR not picking up for HD, as well as abdominal pain, nausea and vomiting-found to have pneumoperitoneum on imaging studies-underwent exploratory laparotomy 12/30/2020 without any findings of bowel perforation.  Also found to be COVID-positive. Currently being treated with hydrotherapy for sacral wound.   Clinical Impression   This 70 y/o male presents with the above. PTA pt residing in SNF for rehab, reports had just progressed to tolerating sitting EOB with therapies and was able to assist with UB ADL. Pt currently limited given the above and below listed deficits, most notably global weakness and sacral pain today. Pt tolerating bed to chair egress (approx 62*) during session to promote/increase tolerance to upright. Pt engaging in bil UE HEP, attempted to engage in bil LE ROM but limited given increased pain/discomfort with activity. He currently requires maxA for bed mobility and up to [place] for ADL. VSS throughout on RA. Pt to benefit from continued acute OT services and recommend follow up therapy services in SNF setting after discharge to maximize his safety and independence with ADL and mobility.     Follow Up Recommendations  SNF    Equipment Recommendations  Other (comment) (TBD in next venue)           Precautions / Restrictions Precautions Precautions: Fall Restrictions Weight Bearing Restrictions: No      Mobility Bed Mobility Overal bed mobility: Needs Assistance Bed Mobility: Rolling Rolling: Max assist         General bed mobility comments: use of bed features to transition  to partial chair egress. pt tolerated to approx 62* for short period of time (approx 5 min), limited due to pain. repositioned in R sidelying end of session per pt request    Transfers                 General transfer comment: unable due to pain        ADL either performed or assessed with clinical judgement   ADL Overall ADL's : Needs assistance/impaired Eating/Feeding: Minimal assistance;Sitting;Bed level   Grooming: Minimal assistance;Sitting                                 General ADL Comments: pt requiring max-totalA for all other ADL at this time                         Pertinent Vitals/Pain Pain Assessment: Faces Faces Pain Scale: Hurts even more Pain Location: sacral region Pain Descriptors / Indicators: Sore;Grimacing;Guarding Pain Intervention(s): Monitored during session;Repositioned     Hand Dominance Right   Extremity/Trunk Assessment Upper Extremity Assessment Upper Extremity Assessment: Generalized weakness   Lower Extremity Assessment Lower Extremity Assessment: Defer to PT evaluation       Communication Communication Communication: No difficulties   Cognition Arousal/Alertness: Awake/alert Behavior During Therapy: Flat affect Overall Cognitive Status: Within Functional Limits for tasks assessed                                 General  Comments: for basic tasks, not formally assessed   General Comments       Exercises Exercises: General Upper Extremity;General Lower Extremity General Exercises - Upper Extremity Shoulder Flexion: AAROM;Both;10 reps Elbow Flexion: AAROM;Both;10 reps Elbow Extension: AAROM;Both;10 reps Digit Composite Flexion: AROM;Both;10 reps Composite Extension: AROM;Both;10 reps General Exercises - Lower Extremity Ankle Circles/Pumps: AROM;AAROM;Both;10 reps Straight Leg Raises: AAROM;Both;10 reps Hip Flexion/Marching: Left;AAROM;5 reps;Other (comment) (too painful to attempt  RLE)   Shoulder Instructions      Home Living Family/patient expects to be discharged to:: Skilled nursing facility                                 Additional Comments: prior to initial hospitalization in Dec pt reports independent living with family      Prior Functioning/Environment Level of Independence: Needs assistance  Gait / Transfers Assistance Needed: assist with bed mobility ADL's / Homemaking Assistance Needed: able to perform upper body dressing and bathing, assist for lower body            OT Problem List: Decreased strength;Decreased activity tolerance;Decreased range of motion;Impaired balance (sitting and/or standing);Pain;Decreased knowledge of use of DME or AE      OT Treatment/Interventions: Self-care/ADL training;Therapeutic exercise;Energy conservation;DME and/or AE instruction;Therapeutic activities;Cognitive remediation/compensation;Patient/family education;Balance training    OT Goals(Current goals can be found in the care plan section) Acute Rehab OT Goals Patient Stated Goal: get out of hospital OT Goal Formulation: With patient Time For Goal Achievement: 01/09/21 Potential to Achieve Goals: Fair  OT Frequency: Min 2X/week   Barriers to D/C:            Co-evaluation              AM-PAC OT "6 Clicks" Daily Activity     Outcome Measure Help from another person eating meals?: A Little Help from another person taking care of personal grooming?: A Little Help from another person toileting, which includes using toliet, bedpan, or urinal?: Total Help from another person bathing (including washing, rinsing, drying)?: A Lot Help from another person to put on and taking off regular upper body clothing?: A Lot Help from another person to put on and taking off regular lower body clothing?: Total 6 Click Score: 12   End of Session Nurse Communication: Mobility status  Activity Tolerance: Patient tolerated treatment well;Patient  limited by pain Patient left: in bed;with call bell/phone within reach;with bed alarm set  OT Visit Diagnosis: Other abnormalities of gait and mobility (R26.89);Pain;Muscle weakness (generalized) (M62.81) Pain - part of body:  (sacral region)                Time: 9371-6967 OT Time Calculation (min): 20 min Charges:  OT General Charges $OT Visit: 1 Visit OT Evaluation $OT Eval Moderate Complexity: 1 Mod  Lou Cal, OT Acute Rehabilitation Services Pager [phone] Office [phone]   Raymondo Band 12/25/2020, 3:21 PM

## 2020-12-25 NOTE — TOC Initial Note (Addendum)
Transition of Care [facility]) - Initial/Assessment Note    Patient Details  Name: [name] MRN: [id number] Date of Birth: 05/14/1951  [facility] CM/SW Contact:    Benard Halsted, [place] Phone Number: 12/25/2020, 3:25 PM  Clinical Narrative:                 CSW made [facility] aware that patient will be ready to return there tomorrow. Sent Fl2. Reached out to Renal Navigator to confirm chair time for covid shift.  Expected Discharge Plan: Skilled Nursing Facility Barriers to Discharge: Continued Medical Work up   Patient Goals and CMS Choice Patient states their goals for this hospitalization and ongoing recovery are:: Return to snf CMS Medicare.gov Compare Post Acute Care list provided to:: Patient Choice offered to / list presented to : Patient  Expected Discharge Plan and Services Expected Discharge Plan: [place] In-house Referral: Clinical Social Work   Post Acute Care Choice: [place] Living arrangements for the past 2 months: [employer]                                      Prior Living Arrangements/Services Living arrangements for the past 2 months: [place] Lives with:: Facility Resident Patient language and need for interpreter reviewed:: Yes Do you feel safe going back to the place where you live?: Yes      Need for Family Participation in Patient Care: No (Comment) Care giver support system in place?: Yes (comment)   Criminal Activity/Legal Involvement Pertinent to Current Situation/Hospitalization: No - Comment as needed  Activities of Daily Living      Permission Sought/Granted Permission sought to share information with : Facility Sport and exercise psychologist                Emotional Assessment   Attitude/Demeanor/Rapport: Engaged Affect (typically observed): Accepting,Appropriate Orientation: : Oriented to Self,Oriented to Place,Oriented to  Time,Oriented to  Situation Alcohol / Substance Use: Not Applicable Psych Involvement: No (comment)  Admission diagnosis:  Bowel perforation (HCC) [K63.1] Pneumoperitoneum [K66.8] Perforated sigmoid colon ([place]) [K63.1] Perforated bowel ([place]) [K63.1] Patient Active Problem List   Diagnosis Date Noted  . Pneumoperitoneum 12/10/2020  . Perforated sigmoid colon ([place]) 12/31/2020  . Perforated bowel ([place]) 12/10/2020  . Problem with dialysis access ([place])   . Complication of vascular dialysis catheter   . Goals of care, counseling/discussion 11/29/2020  . Abdominal distension   . Colitis   . ESRD on hemodialysis ([place])   . Syncope due to orthostatic hypotension 11/02/2020  . Rectal bleeding   . Acute on chronic anemia   . Diarrhea   . Orthostatic hypotension 10/13/2020  . Chronic diarrhea 10/13/2020  . Colitis presumed infectious 10/13/2020  . Pressure injury of skin 10/13/2020  . ESRD on dialysis ([place]) 10/12/2020  . Unspecified atrial fibrillation ([place]) 10/12/2020  . Syncope 10/12/2020  . Small bowel obstruction ([place])   . Type 2 diabetes mellitus ([place]) 06/11/2018  . Peripheral neuropathy 06/11/2018  . Ankle syndesmosis disruption, right, initial encounter 06/11/2018  . AKI (acute kidney injury) ([place])   . Dehydration   . Open right ankle fracture 06/08/2018  . Fracture, ankle 06/08/2018  . Open displaced fracture of medial malleolus of tibia, type III 06/08/2018   PCP:  Patient, No Pcp Per Pharmacy:  No Pharmacies Listed    Social Determinants of Health (SDOH) Interventions  Readmission Risk Interventions Readmission Risk Prevention Plan 12/25/2020  Transportation Screening Complete  Medication Review Press photographer) Referral to Pharmacy  PCP or Specialist appointment within 3-5 days of discharge Complete  HRI or Home Care Consult Complete  SW Recovery Care/Counseling Consult Complete  Palliative Care Screening Not [place] Complete  Some recent data might  be hidden

## 2020-12-25 NOTE — Progress Notes (Signed)
Physical Therapy Wound Treatment Patient Details  Name: [name] MRN: [id number] Date of Birth: Jul 13, 1951  Today's Date: 12/25/2020 Time: 1011-1055 Time Calculation (min): 44 min  Subjective  Subjective: Pleasant and agreeable to wound care. Patient and Family Stated Goals: heal wound Date of Onset:  (unknown - pt reports at least a month) Prior Treatments: unknown  Pain Score:  2/10  Wound Assessment  Pressure Injury 12/05/20 Coccyx Medial;Upper Unstageable - Full thickness tissue loss in which the base of the injury is covered by slough (yellow, tan, gray, green or brown) and/or eschar (tan, brown or black) in the wound bed. prevous stage 2 on sacrum (Active)  Dressing Type ABD;Barrier Film (skin prep);Gauze (Comment) 12/25/20 1326  Dressing Changed;Dry;Clean;Intact 12/25/20 1326  Dressing Change Frequency Twice a day 12/25/20 1326  State of Healing Eschar 12/25/20 1326  Site / Wound Assessment Black;Brown 12/25/20 1326  % Wound base Red or Granulating 0% 12/25/20 1326  % Wound base Yellow/Fibrinous Exudate 0% 12/25/20 1326  % Wound base Black/Eschar 100% 12/25/20 1326  % Wound base Other/Granulation Tissue (Comment) 0% 12/25/20 1326  Peri-wound Assessment Erythema (blanchable) 12/25/20 1326  Wound Length (cm) 8 cm 12/21/20 1500  Wound Width (cm) 5 cm 12/21/20 1500  Wound Depth (cm) 0.1 cm 12/21/20 1500  Wound Surface Area (cm^2) 40 cm^2 12/21/20 1500  Wound Volume (cm^3) 4 cm^3 12/21/20 1500  Tunneling (cm) 0 12/21/20 1500  Undermining (cm) 0 12/21/20 1500  Margins Unattached edges (unapproximated) 12/25/20 1326  Drainage Amount Moderate 12/25/20 1326  Drainage Description Serosanguineous;Purulent;Odor 12/25/20 1326  Treatment Debridement (Selective);Hydrotherapy (Pulse lavage);Packing (Saline gauze) 12/25/20 1326      Hydrotherapy Pulsed lavage therapy - wound location: sacrum Pulsed Lavage with Suction (psi): 12 psi Pulsed Lavage with Suction - Normal Saline Used:  1000 mL Pulsed Lavage Tip: Tip with splash shield Selective Debridement Selective Debridement - Location: sacrum Selective Debridement - Tools Used: Scalpel;Scissors;Forceps Selective Debridement - Tissue Removed: eschar, brown slough   Wound Assessment and Plan  Wound Therapy - Assess/Plan/Recommendations Wound Therapy - Clinical Statement: Pt with increased purulent drainage at 9:00. Sharp debridement of black tarry slough and necrotic edges. Wound does not appear to be improving. Discussed with [name].  Pt will benefit from hydrotherapy and for selective debridement of unviable tissue in order to decrease bioburden and promote wound bed healing. Wound Therapy - Functional Problem List: global weakness and immobility Factors Delaying/Impairing Wound Healing: Immobility;Multiple medical problems Hydrotherapy Plan: Debridement;Dressing change;Patient/family education;Pulsatile lavage with suction Wound Therapy - Frequency: 6X / week Wound Therapy - Follow Up Recommendations: Skilled nursing facility Wound Plan: see above; and orders for Dakin's solution until 1/25 then Santyl  Wound Therapy Goals- Improve the function of patient's integumentary system by progressing the wound(s) through the phases of wound healing (inflammation - proliferation - remodeling) by: Decrease Necrotic Tissue to: 75 Decrease Necrotic Tissue - Progress: Not progressing Increase Granulation Tissue to: 25 Increase Granulation Tissue - Progress: Mot progressing Goals/treatment plan/discharge plan were made with and agreed upon by patient/family: Yes Time For Goal Achievement: 7 days Wound Therapy - Potential for Goals: Good  Goals will be updated until maximal potential achieved or discharge criteria met.  Discharge criteria: when goals achieved, discharge from hospital, MD decision/surgical intervention, no progress towards goals, refusal/missing three consecutive treatments without notification or medical  reason.  GP    Wyona Almas, PT, DPT Acute Rehabilitation Services Pager [phone] Office [phone]   [name] 12/25/2020, 1:30 PM

## 2020-12-25 NOTE — Progress Notes (Signed)
Renal Navigator spoke with patient's outpatient HD clinic/[facility] Social Worker/S. Mosley who states patient will need to treat in isolation for 21 days past positive COVID test (12/24/2020). His COVID shift time will be MWF 8am. Information passed along to [facility] CSW/N. Rayyan.   Alphonzo Cruise, [place] Renal Navigator [phone]
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
## 2020-12-26 DIAGNOSIS — N186 End stage renal disease: Secondary | ICD-10-CM | POA: Diagnosis not present

## 2020-12-26 DIAGNOSIS — U071 COVID-19: Secondary | ICD-10-CM

## 2020-12-26 DIAGNOSIS — K668 Other specified disorders of peritoneum: Secondary | ICD-10-CM | POA: Diagnosis not present

## 2020-12-26 DIAGNOSIS — D696 Thrombocytopenia, unspecified: Secondary | ICD-10-CM | POA: Diagnosis not present

## 2020-12-26 LAB — COMPREHENSIVE METABOLIC PANEL
ALT: 13 U/L (ref 0–44)
AST: 27 U/L (ref 15–41)
Albumin: 1.5 g/dL — ABNORMAL LOW (ref 3.5–5.0)
Alkaline Phosphatase: 125 U/L (ref 38–126)
Anion gap: 16 — ABNORMAL HIGH (ref 5–15)
BUN: 59 mg/dL — ABNORMAL HIGH (ref 8–23)
CO2: 16 mmol/L — ABNORMAL LOW (ref 22–32)
Calcium: 7 mg/dL — ABNORMAL LOW (ref 8.9–10.3)
Chloride: 103 mmol/L (ref 98–111)
Creatinine, Ser: 4.54 mg/dL — ABNORMAL HIGH (ref 0.61–1.24)
GFR, Estimated: 13 mL/min — ABNORMAL LOW (ref >60)
Glucose, Bld: 116 mg/dL — ABNORMAL HIGH (ref 70–99)
Potassium: 3.9 mmol/L (ref 3.5–5.1)
Sodium: 135 mmol/L (ref 135–145)
Total Bilirubin: 0.7 mg/dL (ref 0.3–1.2)
Total Protein: 4.6 g/dL — ABNORMAL LOW (ref 6.5–8.1)

## 2020-12-26 LAB — C-REACTIVE PROTEIN: CRP: 14.2 mg/dL — ABNORMAL HIGH (ref <1.0)

## 2020-12-26 LAB — GLUCOSE, CAPILLARY
Glucose-Capillary: 82 mg/dL (ref 70–99)
Glucose-Capillary: 113 mg/dL — ABNORMAL HIGH (ref 70–99)
Glucose-Capillary: 113 mg/dL — ABNORMAL HIGH (ref 70–99)
Glucose-Capillary: 201 mg/dL — ABNORMAL HIGH (ref 70–99)

## 2020-12-26 LAB — CBC
HCT: 24.3 % — ABNORMAL LOW (ref 39.0–52.0)
Hemoglobin: 7.6 g/dL — ABNORMAL LOW (ref 13.0–17.0)
MCH: 29.8 pg (ref 26.0–34.0)
MCHC: 31.3 g/dL (ref 30.0–36.0)
MCV: 95.3 fL (ref 80.0–100.0)
Platelets: 67 10*3/uL — ABNORMAL LOW (ref 150–400)
RBC: 2.55 MIL/uL — ABNORMAL LOW (ref 4.22–5.81)
RDW: 20.8 % — ABNORMAL HIGH (ref 11.5–15.5)
WBC: 5.8 10*3/uL (ref 4.0–10.5)
nRBC: 0 % (ref 0.0–0.2)

## 2020-12-26 LAB — MAGNESIUM: Magnesium: 1.6 mg/dL — ABNORMAL LOW (ref 1.7–2.4)

## 2020-12-26 MED ORDER — PSYLLIUM 95 % PO PACK
1.0000 | PACK | Freq: Two times a day (BID) | ORAL | Status: DC
  Administered 2020-12-26 – 2020-12-30 (×9): 1 via ORAL
  Filled 2020-12-26 (×9): qty 1

## 2020-12-26 MED ORDER — ALBUMIN HUMAN 25 % IV SOLN
INTRAVENOUS | Status: AC
  Administered 2020-12-26: 25 g
  Filled 2020-12-26: qty 100

## 2020-12-26 MED ORDER — MAGNESIUM SULFATE 4 GM/100ML IV SOLN
4.0000 g | Freq: Once | INTRAVENOUS | Status: AC
  Administered 2020-12-26: 4 g via INTRAVENOUS
  Filled 2020-12-26: qty 100

## 2020-12-26 MED ORDER — PROSOURCE PLUS PO LIQD: 30.0000 mL | Freq: Three times a day (TID) | ORAL | Status: AC

## 2020-12-26 MED ORDER — PSYLLIUM 95 % PO PACK: 1.0000 | PACK | Freq: Two times a day (BID) | ORAL | Status: AC

## 2020-12-26 MED ORDER — HEPARIN SODIUM (PORCINE) 1000 UNIT/ML IJ SOLN
INTRAMUSCULAR | Status: AC
  Filled 2020-12-26: qty 4

## 2020-12-26 MED ORDER — HEPARIN SODIUM (PORCINE) 1000 UNIT/ML IJ SOLN
INTRAMUSCULAR | Status: AC
  Administered 2020-12-26: 3800 [IU]
  Filled 2020-12-26: qty 1

## 2020-12-26 NOTE — Progress Notes (Addendum)
Physical Therapy Wound Treatment Patient Details  Name: [name] MRN: [id number] Date of Birth: [birth year]  Today's Date: 12/26/2020 Time: 4098-1191 Time Calculation (min): 52 min  Subjective  Subjective: Pleasant and agreeable to wound care. Patient and Family Stated Goals: heal wound Date of Onset:  (unknown - pt reports at least a month) Prior Treatments: unknown  Pain Score: 4/10 (premedicated)  Wound Assessment  Pressure Injury 12/05/20 Coccyx Medial;Upper Unstageable - Full thickness tissue loss in which the base of the injury is covered by slough (yellow, tan, gray, green or brown) and/or eschar (tan, brown or black) in the wound bed. prevous stage 2 on sacrum (Active)  Wound Image   12/26/20 1253  Dressing Type ABD;Barrier Film (skin prep);Gauze (Comment);Other (Comment) 12/26/20 1253  Dressing Changed;Clean;Dry;Intact 12/26/20 1253  Dressing Change Frequency Twice a day 12/26/20 1253  State of Healing Eschar 12/26/20 1253  Site / Wound Assessment Black;Brown 12/26/20 1253  % Wound base Red or Granulating 0% 12/26/20 1253  % Wound base Yellow/Fibrinous Exudate 0% 12/26/20 1253  % Wound base Black/Eschar 100% 12/26/20 1253  % Wound base Other/Granulation Tissue (Comment) 0% 12/26/20 1253  Peri-wound Assessment Erythema (blanchable) 12/26/20 1253  Wound Length (cm) 10 cm 12/26/20 1253  Wound Width (cm) 6 cm 12/26/20 1253  Wound Depth (cm) 1 cm 12/26/20 1253  Wound Surface Area (cm^2) 60 cm^2 12/26/20 1253  Wound Volume (cm^3) 60 cm^3 12/26/20 1253  Tunneling (cm) 5 cm at 8 o clock 12/26/20 1253  Undermining (cm) 1-2 at 12-5 o clock 12/26/20 1253  Margins Unattached edges (unapproximated) 12/26/20 1253  Drainage Amount Moderate 12/26/20 1253  Drainage Description Serosanguineous;Purulent;Odor 12/26/20 1253  Treatment Debridement (Selective);Hydrotherapy (Pulse lavage);Packing (Saline gauze) 12/26/20 1253      Hydrotherapy Pulsed lavage therapy - wound location:  sacrum Pulsed Lavage with Suction (psi): 12 psi Pulsed Lavage with Suction - Normal Saline Used: 1000 mL Pulsed Lavage Tip: Tip with splash shield Selective Debridement Selective Debridement - Location: sacrum Selective Debridement - Tools Used: Scalpel;Scissors Selective Debridement - Tissue Removed: eschar, brown slough   Wound Assessment and Plan  Wound Therapy - Assess/Plan/Recommendations Wound Therapy - Clinical Statement: Wound appears to be worsening. Black tarry slough from 12-6 o clock in addition to increased purulent drainage from 8-9 o clock and tunneling up to 5 cm. Will discuss with Salix RN/MD regarding potential surgical consult.  Pt will benefit from hydrotherapy and for selective debridement of unviable tissue in order to decrease bioburden and promote wound bed healing. Wound Therapy - Functional Problem List: global weakness and immobility Factors Delaying/Impairing Wound Healing: Immobility;Multiple medical problems Hydrotherapy Plan: Debridement;Dressing change;Patient/family education;Pulsatile lavage with suction Wound Therapy - Frequency: 6X / week Wound Therapy - Follow Up Recommendations: Skilled nursing facility Wound Plan: see above; and orders for [facility] until 1/25 then [facility]  Wound Therapy Goals- Improve the function of patient's integumentary system by progressing the wound(s) through the phases of wound healing (inflammation - proliferation - remodeling) by: Decrease Necrotic Tissue to: 75 Decrease Necrotic Tissue - Progress: Not progressing Increase Granulation Tissue to: 25 Increase Granulation Tissue - Progress: Mot progressing Goals/treatment plan/discharge plan were made with and agreed upon by patient/family: Yes Time For Goal Achievement: 7 days Wound Therapy - Potential for Goals: Good  Goals will be updated until maximal potential achieved or discharge criteria met.  Discharge criteria: when goals achieved, discharge from hospital, MD  decision/surgical intervention, no progress towards goals, refusal/missing three consecutive treatments without notification or medical reason.  GP  Wyona Almas, PT, DPT Acute Rehabilitation Services Pager [phone] Office [phone]      [name] 12/26/2020, 12:58 PM

## 2020-12-26 NOTE — Discharge Summary (Signed)
PATIENT DETAILS Name: [name] Age: 70 y.o. Sex: male Date of Birth: March 14, 1951 MRN: [id number]. Admitting Physician: Jacky Kindle, MD NWG:[id number], No Pcp Per  Admit Date: 12/26/2020 Discharge date: 12/26/2020  Recommendations for Outpatient Follow-up:  1. Follow up with PCP in 1-2 weeks 2. Please obtain CMP/CBC in one week 3. Please ensure follow-up by cardiology, gastroenterology, general surgery and had his regular dialysis unit. 4. Consider palliative care follow-up at SNF  Admitted From:  SNF  Disposition: SNF   Home Health: No  Equipment/Devices: None  Discharge Condition: Stable  CODE STATUS: FULL CODE  Diet recommendation:  Diet Order            Diet renal with fluid restriction Fluid restriction: 1200 mL Fluid; Room service appropriate? Yes; Fluid consistency: Thin  Diet effective now                  Brief Narrative: Patient is a 70 y.o. male with PMHx of ESRD, chronic diarrhea (with extensive work-up in the past), orthostatic hypotension, PAF-anticoagulation on hold due to history of GI bleeding, -presented to the hospital from SNF due to abdominal pain, nausea and vomiting-found to have pneumoperitoneum on imaging studies-underwent exploratory laparotomy without any findings of bowel perforation.  He was also found to be COVID-positive.  He was managed in the [facility]-transferred to the [facility] hospitalist service.  Significant Events: 1/19>> Admit to [facility] for abdominal pain-pneumoperitoneum on imaging studies-exploratory laparotomy negative-monitored in the ICU postoperatively. 1/22>> transferred to [facility] service.  Significant studies: 1/20>> CT abdomen/pelvis: Focal area of wall thickening within the adjacent perforation of the distal descending colon, free air under the right diaphragm 1/20>> chest x-ray: Hazy density at the bases from small pleural effusions by prior CT.  COVID-19 medications: None  Antibiotics: Zosyn:  1/19>>1/24  Microbiology data: None  Procedures: Exploratory laparotomy 1/20 ETT 1/20>>1/20  Consults: CCS, PCCM, nephrology  Brief Hospital Course: Abdominal pain with nausea/vomiting: Initial concern for perforated viscus-however no evidence of perforated viscus on exploratory laparotomy.  Has tolerated advancement in diet.  No further recommendations by general surgery.  Has finished 5 days of IV Zosyn.  Hypovolemic shock: Resolved-briefly required pressors in the ICU.  Home dose midodrine and Florinef resumed.  Acute hypoxic respiratory failure due to volume overload from missing HD: Currently stable on room air.  ESRD: On HD TTS-nephrology followed closely-resume usual outpatient HD regimen.  Chronic diarrhea: No etiology in spite of extensive GI work-up in the past that any been hospital-Per last discharge summary-no more work-up recommended.    Stools loose but not watery-back to baseline-continue Metamucil and as needed Imodium.  Orthostatic hypotension: Chronic issue-on midodrine/Florinef.  Hypokalemia: Due to occasional diarrhea and Florinef-continue to replete and recheck.  Anemia: Secondary to CKD and acute illness-IV iron-letter for input nephrology-although has anemia-he is asymptomatic-suspect will improve with time.  Continue to follow CBC closely.  Thrombocytopenia:?  Etiology-HIT antibody was negative-potential cause could be COVID-19 infection-that cause thrombocytopenia-thrombocytopenia rapidly improving.  Continue to monitor CBC periodically.    PAF with RVR: Back in sinus rhythm-on metoprolol and amiodarone-no longer on anticoagulation due to recent history of GI bleeding-and now with thrombocytopenia.  Follow with cardiology in the next few weeks to see if anticoagulation can be resumed at some point.  Chronic debility/deconditioning: At the SNF for the past few months-extremely weak-Per patient-he is minimally ambulatory for the past few  months.  COVID-19 infection: Incidental finding-supportive care at this point.  Continue isolation for 10 days from  1/20.   COVID-19 Labs:  Recent Labs    12/24/20 0146 12/25/20 0039 12/26/20 0146  CRP 18.1* 19.4* 14.2*    Lab Results  Component Value Date   SARSCOV2NAA POSITIVE (A) 12/16/2020   SARSCOV2NAA NEGATIVE 12/06/2020   [place] NEGATIVE 11/22/2020   [place] NEGATIVE 11/11/2020     RN pressure injury documentation: Continue wound care at SNF. Pressure Injury 12/05/20 Coccyx Medial;Upper Unstageable - Full thickness tissue loss in which the base of the injury is covered by slough (yellow, tan, gray, green or brown) and/or eschar (tan, brown or black) in the wound bed. prevous stage 2 on sacrum (Active)  12/05/20 1000  Location: Coccyx  Location Orientation: Medial;Upper  Staging: Unstageable - Full thickness tissue loss in which the base of the injury is covered by slough (yellow, tan, gray, green or brown) and/or eschar (tan, brown or black) in the wound bed.  Wound Description (Comments): prevous stage 2 on sacrum, now evolved to unstageable. Black eschar covering wound bed  Present on Admission: No   Discharge Diagnoses:  Active Problems:   Pneumoperitoneum   Perforated sigmoid colon ([place])   Perforated bowel [facility])   Discharge Instructions:    Person Under Monitoring Name: Geoffery Spruce  Location: [address] [place] [address]   Infection Prevention Recommendations for Individuals Confirmed to have, or Being Evaluated for, 2019 Novel Coronavirus (COVID-19) Infection Who Receive Care at Home  Individuals who are confirmed to have, or are being evaluated for, COVID-19 should follow the prevention steps below until a healthcare provider or local or state health department says they can return to normal activities.  Stay home except to get medical care You should restrict activities outside your home, except for getting medical care. Do not go  to work, school, or public areas, and do not use public transportation or taxis.  Call ahead before visiting your doctor Before your medical appointment, call the healthcare provider and tell them that you have, or are being evaluated for, COVID-19 infection. This will help the healthcare provider's office take steps to keep other people from getting infected. Ask your healthcare provider to call the local or state health department.  Monitor your symptoms Seek prompt medical attention if your illness is worsening (e.g., difficulty breathing). Before going to your medical appointment, call the healthcare provider and tell them that you have, or are being evaluated for, COVID-19 infection. Ask your healthcare provider to call the local or state health department.  Wear a facemask You should wear a facemask that covers your nose and mouth when you are in the same room with other people and when you visit a healthcare provider. People who live with or visit you should also wear a facemask while they are in the same room with you.  Separate yourself from other people in your home As much as possible, you should stay in a different room from other people in your home. Also, you should use a separate bathroom, if available.  Avoid sharing household items You should not share dishes, drinking glasses, cups, eating utensils, towels, bedding, or other items with other people in your home. After using these items, you should wash them thoroughly with soap and water.  Cover your coughs and sneezes Cover your mouth and nose with a tissue when you cough or sneeze, or you can cough or sneeze into your sleeve. Throw used tissues in a lined trash can, and immediately wash your hands with soap and water for at least  20 seconds or use an alcohol-based hand rub.  Wash your [facility] your hands often and thoroughly with soap and water for at least 20 seconds. You can use an alcohol-based hand sanitizer  if soap and water are not available and if your hands are not visibly dirty. Avoid touching your eyes, nose, and mouth with unwashed hands.   Prevention Steps for Caregivers and Household Members of Individuals Confirmed to have, or Being Evaluated for, COVID-19 Infection Being Cared for in the Home  If you live with, or provide care at home for, a person confirmed to have, or being evaluated for, COVID-19 infection please follow these guidelines to prevent infection:  Follow healthcare provider's instructions Make sure that you understand and can help the patient follow any healthcare provider instructions for all care.  Provide for the patient's basic needs You should help the patient with basic needs in the home and provide support for getting groceries, prescriptions, and other personal needs.  Monitor the patient's symptoms If they are getting sicker, call his or her medical provider and tell them that the patient has, or is being evaluated for, COVID-19 infection. This will help the healthcare provider's office take steps to keep other people from getting infected. Ask the healthcare provider to call the local or state health department.  Limit the number of people who have contact with the patient  If possible, have only one caregiver for the patient.  Other household members should stay in another home or place of residence. If this is not possible, they should stay  in another room, or be separated from the patient as much as possible. Use a separate bathroom, if available.  Restrict visitors who do not have an essential need to be in the home.  Keep older adults, very young children, and other sick people away from the patient Keep older adults, very young children, and those who have compromised immune systems or chronic health conditions away from the patient. This includes people with chronic heart, lung, or kidney conditions, diabetes, and cancer.  Ensure good  ventilation Make sure that shared spaces in the home have good air flow, such as from an air conditioner or an opened window, weather permitting.  Wash your hands often  Wash your hands often and thoroughly with soap and water for at least 20 seconds. You can use an alcohol based hand sanitizer if soap and water are not available and if your hands are not visibly dirty.  Avoid touching your eyes, nose, and mouth with unwashed hands.  Use disposable paper towels to dry your hands. If not available, use dedicated cloth towels and replace them when they become wet.  Wear a facemask and gloves  Wear a disposable facemask at all times in the room and gloves when you touch or have contact with the patient's blood, body fluids, and/or secretions or excretions, such as sweat, saliva, sputum, nasal mucus, vomit, urine, or feces.  Ensure the mask fits over your nose and mouth tightly, and do not touch it during use.  Throw out disposable facemasks and gloves after using them. Do not reuse.  Wash your hands immediately after removing your facemask and gloves.  If your personal clothing becomes contaminated, carefully remove clothing and launder. Wash your hands after handling contaminated clothing.  Place all used disposable facemasks, gloves, and other waste in a lined container before disposing them with other household waste.  Remove gloves and wash your hands immediately after handling these items.  Do not share dishes, glasses, or other household items with the patient  Avoid sharing household items. You should not share dishes, drinking glasses, cups, eating utensils, towels, bedding, or other items with a patient who is confirmed to have, or being evaluated for, COVID-19 infection.  After the person uses these items, you should wash them thoroughly with soap and water.  Wash laundry thoroughly  Immediately remove and wash clothes or bedding that have blood, body fluids, and/or  secretions or excretions, such as sweat, saliva, sputum, nasal mucus, vomit, urine, or feces, on them.  Wear gloves when handling laundry from the patient.  Read and follow directions on labels of laundry or clothing items and detergent. In general, wash and dry with the warmest temperatures recommended on the label.  Clean all areas the individual has used often  Clean all touchable surfaces, such as counters, tabletops, doorknobs, bathroom fixtures, toilets, phones, keyboards, tablets, and bedside tables, every day. Also, clean any surfaces that may have blood, body fluids, and/or secretions or excretions on them.  Wear gloves when cleaning surfaces the patient has come in contact with.  Use a diluted bleach solution (e.g., dilute bleach with 1 part bleach and 10 parts water) or a household disinfectant with a label that says EPA-registered for coronaviruses. To make a bleach solution at home, add 1 tablespoon of bleach to 1 quart (4 cups) of water. For a larger supply, add  cup of bleach to 1 gallon (16 cups) of water.  Read labels of cleaning products and follow recommendations provided on product labels. Labels contain instructions for safe and effective use of the cleaning product including precautions you should take when applying the product, such as wearing gloves or eye protection and making sure you have good ventilation during use of the product.  Remove gloves and wash hands immediately after cleaning.  Monitor yourself for signs and symptoms of illness Caregivers and household members are considered close contacts, should monitor their health, and will be asked to limit movement outside of the home to the extent possible. Follow the monitoring steps for close contacts listed on the symptom monitoring form.   ? If you have additional questions, contact your local health department or call the epidemiologist on call at [phone] (available 24/7). ? This guidance is subject  to change. For the most up-to-date guidance from CDC, please refer to their website: YouBlogs.pl    Activity:  As tolerated with Full fall precautions use walker/cane & assistance as needed  Discharge Instructions    Call MD for:  difficulty breathing, headache or visual disturbances   Complete by: As directed    Call MD for:  redness, tenderness, or signs of infection (pain, swelling, redness, odor or green/yellow discharge around incision site)   Complete by: As directed    Discharge instructions   Complete by: As directed    Follow with Primary MD in 1-2 weeks  Follow with your dialysis center as previous  Follow with your primary gastroenterologist, primary cardiologist  Follow with your general surgeon-see instructions in after visit summary.  Please get a complete blood count and chemistry panel checked by your Primary MD at your next visit, and again as instructed by your Primary MD.  Get Medicines reviewed and adjusted: Please take all your medications with you for your next visit with your Primary MD  Laboratory/radiological data: Please request your Primary MD to go over all hospital tests and procedure/radiological results at the follow up, please ask your Primary  MD to get all Hospital records sent to his/her office.  In some cases, they will be blood work, cultures and biopsy results pending at the time of your discharge. Please request that your primary care M.D. follows up on these results.  Also Note the following: If you experience worsening of your admission symptoms, develop shortness of breath, life threatening emergency, suicidal or homicidal thoughts you must seek medical attention immediately by calling 911 or calling your MD immediately  if symptoms less severe.  You must read complete instructions/literature along with all the possible adverse reactions/side effects for all the Medicines you  take and that have been prescribed to you. Take any new Medicines after you have completely understood and accpet all the possible adverse reactions/side effects.   Do not drive when taking Pain medications or sleeping medications (Benzodaizepines)  Do not take more than prescribed Pain, Sleep and Anxiety Medications. It is not advisable to combine anxiety,sleep and pain medications without talking with your primary care practitioner  Special Instructions: If you have smoked or chewed Tobacco  in the last 2 yrs please stop smoking, stop any regular Alcohol  and or any Recreational drug use.  Wear Seat belts while driving.  Please note: You were cared for by a hospitalist during your hospital stay. Once you are discharged, your primary care physician will handle any further medical issues. Please note that NO REFILLS for any discharge medications will be authorized once you are discharged, as it is imperative that you return to your primary care physician (or establish a relationship with a primary care physician if you do not have one) for your post hospital discharge needs so that they can reassess your need for medications and monitor your lab values.   Discharge wound care:   Complete by: As directed    recommend daily application of Santyl and saline moistened gauze     Allergies as of 12/26/2020   No Known Allergies     Medication List    STOP taking these medications   pantoprazole 40 MG tablet Commonly known as: PROTONIX   pyridostigmine 60 MG tablet Commonly known as: MESTINON     TAKE these medications   (feeding supplement) PROSource Plus liquid Take 30 mLs by mouth with breakfast, with lunch, and with evening meal. What changed:   how much to take  when to take this  Another medication with the same name was removed. Continue taking this medication, and follow the directions you see here.   acetaminophen 325 MG tablet Commonly known as: TYLENOL Take 2 tablets  (650 mg total) by mouth every 6 (six) hours as needed for mild pain (or Fever >/= 101).   amiodarone 200 MG tablet Commonly known as: PACERONE Take 200 mg by mouth daily.   calcitRIOL 0.5 MCG capsule Commonly known as: ROCALTROL Take 1 capsule (0.5 mcg total) by mouth daily.   collagenase ointment Commonly known as: SANTYL Apply topically daily.   famotidine 20 MG tablet Commonly known as: PEPCID Take 1 tablet (20 mg total) by mouth daily.   finasteride 5 MG tablet Commonly known as: PROSCAR Take 1 tablet (5 mg total) by mouth daily.   fludrocortisone 0.1 MG tablet Commonly known as: FLORINEF Take 2 tablets (0.2 mg total) by mouth daily.   metoprolol tartrate 25 MG tablet Commonly known as: LOPRESSOR Take 0.5 tablets (12.5 mg total) by mouth at bedtime.   midodrine 10 MG tablet Commonly known as: PROAMATINE Take 1 tablet (10 mg total)  by mouth with breakfast, with lunch, and with evening meal.   multivitamin Tabs tablet Take 1 tablet by mouth at bedtime.   omeprazole 20 MG capsule Commonly known as: PRILOSEC Take 20 mg by mouth 2 (two) times daily.   ondansetron 4 MG tablet Commonly known as: ZOFRAN Take 4 mg by mouth every 6 (six) hours as needed for nausea.   psyllium 95 % Pack Commonly known as: HYDROCIL/METAMUCIL Take 1 packet by mouth 2 (two) times daily. What changed: when to take this   rosuvastatin 5 MG tablet Commonly known as: CRESTOR Take 5 mg by mouth at bedtime.            Discharge Care Instructions  (From admission, onward)         Start     Ordered   12/26/20 0000  Discharge wound care:       Comments: recommend daily application of Santyl and saline moistened gauze   12/26/20 1141          Contact information for follow-up providers    [facility] Go on 01/03/2021.   Specialty: General Surgery Why: 2pm. This is a nurse visit for staple removal. Please arrive to the appointment 30 minutes early for paperwork.  Please bring a copy of your photo ID and insurance card.  Contact information: [address] [place] [phone]       Jesusita Oka, MD. Go on 01/17/2021.   Specialty: Surgery Why: 1140am. Please arrive to the appointment 30 minutes early for paperwork. Please bring a copy of your photo ID and insurance card.  Contact information: [place] [address] [phone]        Satira Sark, MD. Schedule an appointment as soon as possible for a visit in 1 week(s).   Specialty: Cardiology Contact information: [place] [address] [phone]        Montez Morita, Quillian Quince, MD. Schedule an appointment as soon as possible for a visit in 1 week(s).   Specialty: Gastroenterology Contact information: [address] [place] [address] [phone]            Contact information for after-discharge care    Destination    [facility] .   Service: Skilled Nursing Contact information: [address] [place] [address] [phone]                 No Known Allergies    Other Procedures/Studies: CT Abdomen Pelvis Wo Contrast  Result Date: 12/08/2020 CLINICAL DATA:  Abdominal pain EXAM: CT ABDOMEN AND PELVIS WITHOUT CONTRAST TECHNIQUE: Multidetector CT imaging of the abdomen and pelvis was performed following the standard protocol without IV contrast. COMPARISON:  November 07, 2020 and October 22, 2020 FINDINGS: Lower chest: The visualized heart size within normal limits. No pericardial fluid/thickening. No hiatal hernia. Small bilateral pleural effusions are seen. Hepatobiliary: A small amount of pneumobilia is seen in the inferior right liver lobe and surrounding the posterior gallbladder. A distended fluid and contrast filled gallbladder is seen, likely due to vicarious contrast excretion. There is small  layering gallstones. Pancreas:  Unremarkable.  No surrounding inflammatory changes. Spleen: Normal in size. Although limited due to the lack of intravenous contrast, normal in appearance. Adrenals/Urinary Tract: Both adrenal glands appear normal. Multiple bilateral renal calculi are again identified. The largest within the upper pole of the right  kidney measuring 5 mm and the largest within the left kidney measuring 4 mm within the midpole. No hydronephrosis. The bladder is decompressed with a Foley catheter. Stomach/Bowel: Small amount of contrast reflux seen within the distal esophagus. There is mildly prominent contrast filled loops of jejunum seen within the left upper quadrant without a clear transition point the remainder of the small bowel is decompressed. There is been interval increased air and stool-filled dilation of the rectum and sigmoid colon to the level of the descending colon junction. At the level of the descending colon/sigmoid colon junction there is focal wall thickening and a small amount of free air seen anteriorly, consistent with a probable transition point and perforation. There is air seen surrounding the stool at the level of the sigmoid rectal junction which could be concerning for pneumatosis. Vascular/Lymphatic: Scattered aortic atherosclerosis is noted. Reproductive: The prostate is unremarkable. Other: There is a moderate amount of free air seen under the hemidiaphragms and is seen surrounding the porta hepatis. Musculoskeletal: Bilateral sacral decubitus ulceration seen over the inferior coccyx with debris and foci of air. There is non loculated fluid in this area. Degenerative changes seen throughout the lumbar spine most notable of L2-L3. IMPRESSION: 1. There is a focal area of wall thickening with adjacent perforation of the distal descending colon at the junction of the sigmoid colon, likely the transition point. 2. There is moderately dilated sigmoid colon and rectum which is  stool-filled and suggestion of possible pneumatosis. 3. Free air seen under the right hemidiaphragm and within the porta hepatis. 4. Small amount of air seen within the inferior right liver lobe which is concerning for portal venous gas given the patient's findings 5. Bilateral sacral decubitus ulceration with phlegmon and subcutaneous emphysema extending to the inferior coccyx. 6. These results were called by telephone at the time of interpretation on 12/22/2020 at 2:01 am to provider PA Sammy , who verbally acknowledged these results. Electronically Signed   By: Prudencio Pair M.D.   On: 12/12/2020 02:06   DG CHEST PORT 1 VIEW  Result Date: 12/12/2020 CLINICAL DATA:  Intubation EXAM: PORTABLE CHEST 1 VIEW COMPARISON:  Ten days ago FINDINGS: Endotracheal tube with tip at the clavicular heads. The enteric tube reaches the stomach with side port near the GE junction. Dialysis catheter with tips at the upper right atrium. Large volume pneumoperitoneum, known prior CT. Hazy density at the lung bases from pleural fluid primarily based on CT. IMPRESSION: 1. Enteric tube side-port is at the GE junction. Otherwise unremarkable hardware. 2. Hazy density at the bases from small pleural effusions by prior CT. 3. Pneumoperitoneum as seen on prior CT. Electronically Signed   By: Monte Fantasia M.D.   On: 12/26/2020 07:59   DG Chest Port 1 View  Result Date: 12/10/2020 CLINICAL DATA:  Status post exchange of a dialysis catheter. EXAM: PORTABLE CHEST 1 VIEW COMPARISON:  Single-view of the chest 12/07/2020. FINDINGS: New right IJ approach dialysis catheter is in place with its tip at the superior cavoatrial junction. No pneumothorax. Lungs are clear. Heart size is normal. No acute or focal bony abnormality. IMPRESSION: Dialysis catheter tip projects at the superior cavoatrial junction. Negative for pneumothorax or acute disease. Electronically Signed   By: Inge Rise M.D.   On: 12/10/2020 12:09   DG Chest Port 1  View  Result Date: 12/07/2020 CLINICAL DATA:  Post RIGHT-side dialysis catheter exchange EXAM: PORTABLE CHEST 1 VIEW COMPARISON:  Portable exam 1240 hours compared to 1003 hours FINDINGS:  RIGHT jugular dual-lumen central venous catheter with tip projecting over SVC. Normal heart size, mediastinal contours, and pulmonary vascularity. Atherosclerotic calcification aorta. Minimal chronic peribronchial thickening without pulmonary infiltrate, pleural effusion, or pneumothorax. Bones mildly demineralized. IMPRESSION: No pneumothorax following RIGHT jugular line placement. Minimal persistent bronchitic changes. Aortic Atherosclerosis (ICD10-I70.0). Electronically Signed   By: Lavonia Dana M.D.   On: 12/07/2020 12:55   DG Chest Port 1 View  Result Date: 12/06/2020 CLINICAL DATA:  End-stage renal disease. EXAM: PORTABLE CHEST 1 VIEW COMPARISON:  October 29, 2020. FINDINGS: The heart size and mediastinal contours are within normal limits. Both lungs are clear. Right internal jugular dialysis catheter is unchanged in position with distal tip in expected position of cavoatrial junction. The visualized skeletal structures are unremarkable. IMPRESSION: No active disease. Electronically Signed   By: Marijo Conception M.D.   On: 12/06/2020 10:34   DG Abd Portable 1V  Result Date: 11/28/2020 CLINICAL DATA:  Abdominal distension abdominal pain EXAM: PORTABLE ABDOMEN - 1 VIEW COMPARISON:  November 07, 2020 CT assessment, abdominal plain film from November 15, 2020 FINDINGS: Decreased distension of bowel loops in the abdomen when compared to the study from November 15, 2020. Mild distension currently of scattered gas-filled loops of small bowel. Suggestion of small amount of proximal rectal gas. No acute skeletal process on limited assessment. Signs of basilar atelectasis. IMPRESSION: Decreased distension of bowel loops in the abdomen when compared to the study from November 15, 2020. Mild distension currently of scattered  gas-filled loops of small bowel may reflect mild ileus. Suggestion of small amount of proximal rectal gas. Electronically Signed   By: Zetta Bills M.D.   On: 11/28/2020 16:15   DG C-Arm 1-60 Min-No Report  Result Date: 12/10/2020 Fluoroscopy was utilized by the requesting physician.  No radiographic interpretation.   DG C-Arm 1-60 Min-No Report  Result Date: 12/07/2020 Fluoroscopy was utilized by the requesting physician.  No radiographic interpretation.     TODAY-DAY OF DISCHARGE:  Subjective:   Copeland Neisen today has no headache,no chest abdominal pain,no new weakness tingling or numbness, feels much better wants to go home today.   Objective:   Blood pressure 119/73, pulse 68, temperature 98.1 F (36.7 C), temperature source Oral, resp. rate 19, height 6\' 2"  (1.88 m), weight 69.2 kg, SpO2 99 %.  Intake/Output Summary (Last 24 hours) at 12/26/2020 1147 Last data filed at 12/25/2020 2009 Gross per 24 hour  Intake 120 ml  Output -  Net 120 ml   Filed Weights   12/22/20 0400 12/23/20 0402 12/24/20 0344  Weight: 69.2 kg 70.2 kg 69.2 kg    Exam: Awake Alert, Oriented *3, No new F.N deficits, Normal affect [place].AT,PERRAL Supple Neck,No JVD, No cervical lymphadenopathy appriciated.  Symmetrical Chest wall movement, Good air movement bilaterally, CTAB RRR,No Gallops,Rubs or new Murmurs, No Parasternal Heave +ve B.Sounds, Abd Soft, Non tender, No organomegaly appriciated, No rebound -guarding or rigidity. No Cyanosis, Clubbing or edema, No new Rash or bruise   PERTINENT RADIOLOGIC STUDIES: CT Abdomen Pelvis Wo Contrast  Result Date: 12/01/2020 CLINICAL DATA:  Abdominal pain EXAM: CT ABDOMEN AND PELVIS WITHOUT CONTRAST TECHNIQUE: Multidetector CT imaging of the abdomen and pelvis was performed following the standard protocol without IV contrast. COMPARISON:  November 07, 2020 and October 22, 2020 FINDINGS: Lower chest: The visualized heart size within normal limits. No pericardial  fluid/thickening. No hiatal hernia. Small bilateral pleural effusions are seen. Hepatobiliary: A small amount of pneumobilia is seen in the inferior right  liver lobe and surrounding the posterior gallbladder. A distended fluid and contrast filled gallbladder is seen, likely due to vicarious contrast excretion. There is small layering gallstones. Pancreas:  Unremarkable.  No surrounding inflammatory changes. Spleen: Normal in size. Although limited due to the lack of intravenous contrast, normal in appearance. Adrenals/Urinary Tract: Both adrenal glands appear normal. Multiple bilateral renal calculi are again identified. The largest within the upper pole of the right kidney measuring 5 mm and the largest within the left kidney measuring 4 mm within the midpole. No hydronephrosis. The bladder is decompressed with a Foley catheter. Stomach/Bowel: Small amount of contrast reflux seen within the distal esophagus. There is mildly prominent contrast filled loops of jejunum seen within the left upper quadrant without a clear transition point the remainder of the small bowel is decompressed. There is been interval increased air and stool-filled dilation of the rectum and sigmoid colon to the level of the descending colon junction. At the level of the descending colon/sigmoid colon junction there is focal wall thickening and a small amount of free air seen anteriorly, consistent with a probable transition point and perforation. There is air seen surrounding the stool at the level of the sigmoid rectal junction which could be concerning for pneumatosis. Vascular/Lymphatic: Scattered aortic atherosclerosis is noted. Reproductive: The prostate is unremarkable. Other: There is a moderate amount of free air seen under the hemidiaphragms and is seen surrounding the porta hepatis. Musculoskeletal: Bilateral sacral decubitus ulceration seen over the inferior coccyx with debris and foci of air. There is non loculated fluid in this  area. Degenerative changes seen throughout the lumbar spine most notable of L2-L3. IMPRESSION: 1. There is a focal area of wall thickening with adjacent perforation of the distal descending colon at the junction of the sigmoid colon, likely the transition point. 2. There is moderately dilated sigmoid colon and rectum which is stool-filled and suggestion of possible pneumatosis. 3. Free air seen under the right hemidiaphragm and within the porta hepatis. 4. Small amount of air seen within the inferior right liver lobe which is concerning for portal venous gas given the patient's findings 5. Bilateral sacral decubitus ulceration with phlegmon and subcutaneous emphysema extending to the inferior coccyx. 6. These results were called by telephone at the time of interpretation on 12/16/2020 at 2:01 am to provider PA Sammy , who verbally acknowledged these results. Electronically Signed   By: Prudencio Pair M.D.   On: 12/19/2020 02:06   DG CHEST PORT 1 VIEW  Result Date: 12/28/2020 CLINICAL DATA:  Intubation EXAM: PORTABLE CHEST 1 VIEW COMPARISON:  Ten days ago FINDINGS: Endotracheal tube with tip at the clavicular heads. The enteric tube reaches the stomach with side port near the GE junction. Dialysis catheter with tips at the upper right atrium. Large volume pneumoperitoneum, known prior CT. Hazy density at the lung bases from pleural fluid primarily based on CT. IMPRESSION: 1. Enteric tube side-port is at the GE junction. Otherwise unremarkable hardware. 2. Hazy density at the bases from small pleural effusions by prior CT. 3. Pneumoperitoneum as seen on prior CT. Electronically Signed   By: Monte Fantasia M.D.   On: 12/19/2020 07:59   DG Chest Port 1 View  Result Date: 12/10/2020 CLINICAL DATA:  Status post exchange of a dialysis catheter. EXAM: PORTABLE CHEST 1 VIEW COMPARISON:  Single-view of the chest 12/07/2020. FINDINGS: New right IJ approach dialysis catheter is in place with its tip at the superior  cavoatrial junction. No pneumothorax. Lungs are clear. Heart  size is normal. No acute or focal bony abnormality. IMPRESSION: Dialysis catheter tip projects at the superior cavoatrial junction. Negative for pneumothorax or acute disease. Electronically Signed   By: Inge Rise M.D.   On: 12/10/2020 12:09   DG Chest Port 1 View  Result Date: 12/07/2020 CLINICAL DATA:  Post RIGHT-side dialysis catheter exchange EXAM: PORTABLE CHEST 1 VIEW COMPARISON:  Portable exam 1240 hours compared to 1003 hours FINDINGS: RIGHT jugular dual-lumen central venous catheter with tip projecting over SVC. Normal heart size, mediastinal contours, and pulmonary vascularity. Atherosclerotic calcification aorta. Minimal chronic peribronchial thickening without pulmonary infiltrate, pleural effusion, or pneumothorax. Bones mildly demineralized. IMPRESSION: No pneumothorax following RIGHT jugular line placement. Minimal persistent bronchitic changes. Aortic Atherosclerosis (ICD10-I70.0). Electronically Signed   By: Lavonia Dana M.D.   On: 12/07/2020 12:55   DG Chest Port 1 View  Result Date: 12/06/2020 CLINICAL DATA:  End-stage renal disease. EXAM: PORTABLE CHEST 1 VIEW COMPARISON:  October 29, 2020. FINDINGS: The heart size and mediastinal contours are within normal limits. Both lungs are clear. Right internal jugular dialysis catheter is unchanged in position with distal tip in expected position of cavoatrial junction. The visualized skeletal structures are unremarkable. IMPRESSION: No active disease. Electronically Signed   By: Marijo Conception M.D.   On: 12/06/2020 10:34   DG Abd Portable 1V  Result Date: 11/28/2020 CLINICAL DATA:  Abdominal distension abdominal pain EXAM: PORTABLE ABDOMEN - 1 VIEW COMPARISON:  November 07, 2020 CT assessment, abdominal plain film from November 15, 2020 FINDINGS: Decreased distension of bowel loops in the abdomen when compared to the study from November 15, 2020. Mild distension currently of  scattered gas-filled loops of small bowel. Suggestion of small amount of proximal rectal gas. No acute skeletal process on limited assessment. Signs of basilar atelectasis. IMPRESSION: Decreased distension of bowel loops in the abdomen when compared to the study from November 15, 2020. Mild distension currently of scattered gas-filled loops of small bowel may reflect mild ileus. Suggestion of small amount of proximal rectal gas. Electronically Signed   By: Zetta Bills M.D.   On: 11/28/2020 16:15   DG C-Arm 1-60 Min-No Report  Result Date: 12/10/2020 Fluoroscopy was utilized by the requesting physician.  No radiographic interpretation.   DG C-Arm 1-60 Min-No Report  Result Date: 12/07/2020 Fluoroscopy was utilized by the requesting physician.  No radiographic interpretation.     PERTINENT LAB RESULTS: CBC: Recent Labs    12/25/20 0039 12/26/20 0146  WBC 4.4 5.8  HGB 7.8* 7.6*  HCT 24.4* 24.3*  PLT 59* 67*   CMET CMP     Component Value Date/Time   NA 135 12/26/2020 0146   K 3.9 12/26/2020 0146   CL 103 12/26/2020 0146   CO2 16 (L) 12/26/2020 0146   GLUCOSE 116 (H) 12/26/2020 0146   BUN 59 (H) 12/26/2020 0146   CREATININE 4.54 (H) 12/26/2020 0146   CALCIUM 7.0 (L) 12/26/2020 0146   PROT 4.6 (L) 12/26/2020 0146   ALBUMIN 1.5 (L) 12/26/2020 0146   AST 27 12/26/2020 0146   ALT 13 12/26/2020 0146   ALKPHOS 125 12/26/2020 0146   BILITOT 0.7 12/26/2020 0146   GFRNONAA 13 (L) 12/26/2020 0146   GFRAA 19 (L) 06/15/2018 0407    GFR Estimated Creatinine Clearance: 14.8 mL/min (A) (by C-G formula based on SCr of 4.54 mg/dL (H)). No results for input(s): LIPASE, AMYLASE in the last 72 hours. No results for input(s): CKTOTAL, CKMB, CKMBINDEX, TROPONINI in the last 72 hours.  Invalid input(s): POCBNP No results for input(s): DDIMER in the last 72 hours. No results for input(s): HGBA1C in the last 72 hours. No results for input(s): CHOL, HDL, LDLCALC, TRIG, CHOLHDL, LDLDIRECT in  the last 72 hours. No results for input(s): TSH, T4TOTAL, T3FREE, THYROIDAB in the last 72 hours.  Invalid input(s): FREET3 No results for input(s): VITAMINB12, FOLATE, FERRITIN, TIBC, IRON, RETICCTPCT in the last 72 hours. Coags: No results for input(s): INR in the last 72 hours.  Invalid input(s): PT Microbiology: Recent Results (from the past 240 hour(s))  Resp Panel by RT-PCR (Flu A&B, Covid) Nasopharyngeal Swab     Status: Abnormal   Collection Time: 12/15/2020  2:50 AM   Specimen: Nasopharyngeal Swab; Nasopharyngeal(NP) swabs in vial transport medium  Result Value Ref Range Status   SARS Coronavirus 2 by RT PCR POSITIVE (A) NEGATIVE Final    Comment: RESULT CALLED TO, READ BACK BY AND VERIFIED WITH: C YELVERTON RN 12/28/2020 0349 JDW (NOTE) SARS-CoV-2 target nucleic acids are DETECTED.  The SARS-CoV-2 RNA is generally detectable in upper respiratory specimens during the acute phase of infection. Positive results are indicative of the presence of the identified virus, but do not rule out bacterial infection or co-infection with other pathogens not detected by the test. Clinical correlation with patient history and other diagnostic information is necessary to determine patient infection status. The expected result is Negative.  Fact Sheet for Patients: EntrepreneurPulse.com.au  Fact Sheet for Healthcare Providers: IncredibleEmployment.be  This test is not yet approved or cleared by the [place] FDA and  has been authorized for detection and/or diagnosis of SARS-CoV-2 by FDA under an Emergency Use Authorization (EUA).  This EUA will remain in effect (meaning this test can be u sed) for the duration of  the COVID-19 declaration under Section 564(b)(1) of the Act, 21 U.S.C. section 360bbb-3(b)(1), unless the authorization is terminated or revoked sooner.     Influenza A by PCR NEGATIVE NEGATIVE Final   Influenza B by PCR NEGATIVE  NEGATIVE Final    Comment: (NOTE) The Xpert Xpress SARS-CoV-2/FLU/RSV plus assay is intended as an aid in the diagnosis of influenza from Nasopharyngeal swab specimens and should not be used as a sole basis for treatment. Nasal washings and aspirates are unacceptable for Xpert Xpress SARS-CoV-2/FLU/RSV testing.  Fact Sheet for Patients: EntrepreneurPulse.com.au  Fact Sheet for Healthcare Providers: IncredibleEmployment.be  This test is not yet approved or cleared by the [place] FDA and has been authorized for detection and/or diagnosis of SARS-CoV-2 by FDA under an Emergency Use Authorization (EUA). This EUA will remain in effect (meaning this test can be used) for the duration of the COVID-19 declaration under Section 564(b)(1) of the Act, 21 U.S.C. section 360bbb-3(b)(1), unless the authorization is terminated or revoked.  Performed at [facility], [place] [address] [place], [place] [address]   MRSA PCR Screening     Status: Abnormal   Collection Time: 12/21/20 11:39 PM  Result Value Ref Range Status   MRSA by PCR POSITIVE (A) NEGATIVE Final    Comment:        The GeneXpert MRSA Assay (FDA approved for NASAL specimens only), is one component of a comprehensive MRSA colonization surveillance program. It is not intended to diagnose MRSA infection nor to guide or monitor treatment for MRSA infections. RESULT CALLED TO, READ BACK BY AND VERIFIED WITH: D TUCK RN 12/22/20 0134 JDW Performed at [facility], [address], [place], [place] [address]  FURTHER DISCHARGE INSTRUCTIONS:  Get Medicines reviewed and adjusted: Please take all your medications with you for your next visit with your Primary MD  Laboratory/radiological data: Please request your Primary MD to go over all hospital tests and procedure/radiological results at the follow up, please ask your Primary MD to get all Hospital records sent to his/her  office.  In some cases, they will be blood work, cultures and biopsy results pending at the time of your discharge. Please request that your primary care M.D. goes through all the records of your hospital data and follows up on these results.  Also Note the following: If you experience worsening of your admission symptoms, develop shortness of breath, life threatening emergency, suicidal or homicidal thoughts you must seek medical attention immediately by calling 911 or calling your MD immediately  if symptoms less severe.  You must read complete instructions/literature along with all the possible adverse reactions/side effects for all the Medicines you take and that have been prescribed to you. Take any new Medicines after you have completely understood and accpet all the possible adverse reactions/side effects.   Do not drive when taking Pain medications or sleeping medications (Benzodaizepines)  Do not take more than prescribed Pain, Sleep and Anxiety Medications. It is not advisable to combine anxiety,sleep and pain medications without talking with your primary care practitioner  Special Instructions: If you have smoked or chewed Tobacco  in the last 2 yrs please stop smoking, stop any regular Alcohol  and or any Recreational drug use.  Wear Seat belts while driving.  Please note: You were cared for by a hospitalist during your hospital stay. Once you are discharged, your primary care physician will handle any further medical issues. Please note that NO REFILLS for any discharge medications will be authorized once you are discharged, as it is imperative that you return to your primary care physician (or establish a relationship with a primary care physician if you do not have one) for your post hospital discharge needs so that they can reassess your need for medications and monitor your lab values.  Total Time spent coordinating discharge including counseling, education and face to face time  equals 35 minutes.  SignedOren Binet 12/26/2020 11:47 AM

## 2020-12-26 NOTE — TOC Progression Note (Signed)
Transition of Care [facility]) - Progression Note    Patient Details  Name: [name] MRN: [id number] Date of Birth: October 25, 1951  Transition of Care [facility]) CM/SW [place], [place] Phone Number: 12/26/2020, 11:16 AM  Clinical Narrative:    [name] is aware of patient's discharge today after dialysis. Will schedule ptar for afterwards.   Expected Discharge Plan: [place] Barriers to Discharge: Continued Medical Work up  Expected Discharge Plan and Services Expected Discharge Plan: [place] In-house Referral: Clinical Social Work   Post Acute Care Choice: [place] Living arrangements for the past 2 months: [place]                                       Social Determinants of Health (SDOH) Interventions    Readmission Risk Interventions Readmission Risk Prevention Plan 12/25/2020  Transportation Screening Complete  Medication Review Press photographer) Referral to Pharmacy  PCP or Specialist appointment within 3-5 days of discharge Complete  HRI or [place] Complete  SW Recovery Care/Counseling Consult Complete  Palliative Care Screening Not [place] Complete  Some recent data might be hidden

## 2020-12-26 NOTE — TOC Progression Note (Signed)
Transition of Care [facility]) - Progression Note    Patient Details  Name: KREED KAUFFMAN MRN: [id number] Date of Birth: 08/23/1951  Transition of Care [facility]) CM/SW [place], LCSW Phone Number: 12/26/2020, 2:42 PM  Clinical Narrative:    CSW notified by MD that patient's discharge is cancelled. CSW made [facility] aware. Per renal navigator, PTAR is requesting to schedule his dialysis sessions ahead of time. CSW contacted Ned Grace, and made him aware of need for 10am appointment at [facility] starting Friday 1/28 if patient is medically stable. They will pick patient up at [facility].     Expected Discharge Plan: [place] Barriers to Discharge: Continued Medical Work up  Expected Discharge Plan and Services Expected Discharge Plan: [place] In-house Referral: Clinical Social Work   Post Acute Care Choice: [place] Living arrangements for the past 2 months: [place] Expected Discharge Date: 12/26/20                                     Social Determinants of Health (SDOH) Interventions    Readmission Risk Interventions Readmission Risk Prevention Plan 12/25/2020  Transportation Screening Complete  Medication Review Press photographer) Referral to Pharmacy  PCP or Specialist appointment within 3-5 days of discharge Complete  HRI or Home Care Consult Complete  SW Recovery Care/Counseling Consult Complete  Palliative Care Screening Not [place] Complete  Some recent data might be hidden

## 2020-12-26 NOTE — Progress Notes (Signed)
Informed by PT/hydrotherapy that patient's wound is tunneling at some point there is purulent drainage-wound care recommending general surgical consult.  Hold discharge-have consulted general surgery.
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
## 2020-12-26 NOTE — Progress Notes (Signed)
Renal Navigator contacted [facility] to ensure that they are able to provide transportation to patient's outpatient HD clinic[facility] from SNF/[facility], as there was miscommunication with this plan after discharge from patient's last inpt hospitalization. [facility]/Valerie states no issues at this time with prior authorization/all paperwork received from Renal Navigator after patient's ED visit on 12/18/20, however, given patient's new HD schedule MWF 8am, [facility] reports that they are not able to transport at this time and that 10am will be the earliest. Navigator spoke with patient's clinic, who agree to treat him in isolation MWF at [place] states sincere appreciation. [facility] needs rides to be scheduled today to start Friday. Navigator updated TOC CSW as SNF information (room number) is needed.   Alphonzo Cruise, [place] Renal Navigator [phone]

## 2020-12-26 NOTE — Progress Notes (Signed)
Physical Therapy Treatment Patient Details Name: [name] MRN: [id number] DOB: [birth year] Today's Date: 12/26/2020    History of Present Illness 70 y.o. male with PMHx of ESRD, TThS chronic diarrhea (with extensive work-up in the past), orthostatic hypotension, PAF-anticoagulation on hold due to history of GI bleeding, -presented to the hospital from SNF [facility]) 12/18/20 due to [facility] not picking up for HD, as well as abdominal pain, nausea and vomiting-found to have pneumoperitoneum on imaging studies-underwent exploratory laparotomy 12/06/2020 without any findings of bowel perforation.  Also found to be COVID-positive. Currently being treated with hydrotherapy for sacral wound.    PT Comments    On arrival, rectal pouch noted to be dettached and pt soiled with stool. Max assist rolling R/L multiple times for linen change and pericare with RN. RN changed soiled sacral dressing and applied new rectal pouch. Pt unable to tolerate further mobility or exercises due to pain and fatigue. Pillows used to position pt in R sidelying at end of session. SpO2 > 90% on RA.     Follow Up Recommendations  SNF     Equipment Recommendations  Wheelchair (measurements PT);Wheelchair cushion (measurements PT)    Recommendations for Other Services       Precautions / Restrictions Precautions Precautions: Fall;Other (comment) Precaution Comments: rectal pouch    Mobility  Bed Mobility Overal bed mobility: Needs Assistance Bed Mobility: Rolling Rolling: Max assist   Supine to sit: Max assist Sit to supine: Total assist   General bed mobility comments: +rails, cues for sequencing, assist with all aspects of mobility  Transfers                    Ambulation/Gait                 Stairs             Wheelchair Mobility    Modified Rankin (Stroke Patients Only)       Balance                                            Cognition  Arousal/Alertness: Awake/alert Behavior During Therapy: Flat affect Overall Cognitive Status: Within Functional Limits for tasks assessed                                 General Comments: for basic tasks, not formally assessed      Exercises      General Comments        Pertinent Vitals/Pain Pain Assessment: Faces Faces Pain Scale: Hurts whole lot Pain Location: sacral region with mobility Pain Descriptors / Indicators: Grimacing;Guarding;Discomfort Pain Intervention(s): Limited activity within patient's tolerance;Monitored during session;Repositioned;Patient requesting pain meds-RN notified    Home Living                      Prior Function            PT Goals (current goals can now be found in the care plan section) Acute Rehab PT Goals Patient Stated Goal: decrease pain Progress towards PT goals: Progressing toward goals    Frequency    Min 2X/week      PT Plan Current plan remains appropriate    Co-evaluation              AM-PAC  PT "6 Clicks" Mobility   Outcome Measure  Help needed turning from your back to your side while in a flat bed without using bedrails?: Total Help needed moving from lying on your back to sitting on the side of a flat bed without using bedrails?: Total Help needed moving to and from a bed to a chair (including a wheelchair)?: Total Help needed standing up from a chair using your arms (e.g., wheelchair or bedside chair)?: Total Help needed to walk in hospital room?: Total Help needed climbing 3-5 steps with a railing? : Total 6 Click Score: 6    End of Session   Activity Tolerance: Patient limited by pain Patient left: in bed;with call bell/phone within reach;with bed alarm set;with nursing/sitter in room Nurse Communication: Mobility status PT Visit Diagnosis: Muscle weakness (generalized) (M62.81);Pain;Difficulty in walking, not elsewhere classified (R26.2);Other abnormalities of gait and mobility  (R26.89)     Time: 3474-2595 PT Time Calculation (min) (ACUTE ONLY): 36 min  Charges:  $Therapeutic Activity: 23-37 mins                     Lorrin Goodell, PT  Office # [phone] Pager [phone]    Lorriane Shire 12/26/2020, 11:28 AM

## 2020-12-26 NOTE — Plan of Care (Signed)
  Problem: Education: Goal: Knowledge of General Education information will improve Description: Including pain rating scale, medication(s)/side effects and non-pharmacologic comfort measures Outcome: Progressing   Problem: Health Behavior/Discharge Planning: Goal: Ability to manage health-related needs will improve Outcome: Progressing   Problem: Clinical Measurements: Goal: Ability to maintain clinical measurements within normal limits will improve Outcome: Progressing Goal: Will remain free from infection Outcome: Progressing Goal: Diagnostic test results will improve Outcome: Progressing Goal: Respiratory complications will improve Outcome: Progressing Goal: Cardiovascular complication will be avoided Outcome: Progressing   Problem: Activity: Goal: Risk for activity intolerance will decrease Outcome: Progressing   Problem: Nutrition: Goal: Adequate nutrition will be maintained Outcome: Progressing   Problem: Elimination: Goal: Will not experience complications related to bowel motility Outcome: Progressing Goal: Will not experience complications related to urinary retention Outcome: Progressing   Problem: Pain Managment: Goal: General experience of comfort will improve Outcome: Progressing   Problem: Safety: Goal: Ability to remain free from injury will improve Outcome: Progressing   Problem: Skin Integrity: Goal: Risk for impaired skin integrity will decrease Outcome: Progressing   Problem: Education: Goal: Knowledge of risk factors and measures for prevention of condition will improve Outcome: Progressing   Problem: Coping: Goal: Psychosocial and spiritual needs will be supported Outcome: Progressing   Problem: Respiratory: Goal: Will maintain a patent airway Outcome: Progressing Goal: Complications related to the disease process, condition or treatment will be avoided or minimized Outcome: Progressing   Problem: Education: Goal: Required  Educational Video(s) Outcome: Progressing   Problem: Clinical Measurements: Goal: Ability to maintain clinical measurements within normal limits will improve Outcome: Progressing Goal: Postoperative complications will be avoided or minimized Outcome: Progressing   Problem: Skin Integrity: Goal: Demonstration of wound healing without infection will improve Outcome: Progressing   Problem: Education: Goal: Ability to describe self-care measures that may prevent or decrease complications (Diabetes Survival Skills Education) will improve Outcome: Progressing Goal: Individualized Educational Video(s) Outcome: Progressing   Problem: Coping: Goal: Ability to adjust to condition or change in health will improve Outcome: Progressing   Problem: Fluid Volume: Goal: Ability to maintain a balanced intake and output will improve Outcome: Progressing   Problem: Health Behavior/Discharge Planning: Goal: Ability to identify and utilize available resources and services will improve Outcome: Progressing Goal: Ability to manage health-related needs will improve Outcome: Progressing   Problem: Metabolic: Goal: Ability to maintain appropriate glucose levels will improve Outcome: Progressing   Problem: Nutritional: Goal: Maintenance of adequate nutrition will improve Outcome: Progressing Goal: Progress toward achieving an optimal weight will improve Outcome: Progressing   Problem: Skin Integrity: Goal: Risk for impaired skin integrity will decrease Outcome: Progressing   Problem: Tissue Perfusion: Goal: Adequacy of tissue perfusion will improve Outcome: Progressing

## 2020-12-27 ENCOUNTER — Encounter (HOSPITAL_COMMUNITY): Payer: Self-pay | Admitting: Internal Medicine

## 2020-12-27 ENCOUNTER — Other Ambulatory Visit: Payer: Self-pay

## 2020-12-27 DIAGNOSIS — K668 Other specified disorders of peritoneum: Secondary | ICD-10-CM | POA: Diagnosis not present

## 2020-12-27 DIAGNOSIS — I480 Paroxysmal atrial fibrillation: Secondary | ICD-10-CM | POA: Diagnosis not present

## 2020-12-27 DIAGNOSIS — N186 End stage renal disease: Secondary | ICD-10-CM | POA: Diagnosis not present

## 2020-12-27 LAB — COMPREHENSIVE METABOLIC PANEL
ALT: 14 U/L (ref 0–44)
AST: 30 U/L (ref 15–41)
Albumin: 1.8 g/dL — ABNORMAL LOW (ref 3.5–5.0)
Alkaline Phosphatase: 127 U/L — ABNORMAL HIGH (ref 38–126)
Anion gap: 14 (ref 5–15)
BUN: 36 mg/dL — ABNORMAL HIGH (ref 8–23)
CO2: 21 mmol/L — ABNORMAL LOW (ref 22–32)
Calcium: 7.3 mg/dL — ABNORMAL LOW (ref 8.9–10.3)
Chloride: 102 mmol/L (ref 98–111)
Creatinine, Ser: 3.43 mg/dL — ABNORMAL HIGH (ref 0.61–1.24)
GFR, Estimated: 18 mL/min — ABNORMAL LOW (ref >60)
Glucose, Bld: 153 mg/dL — ABNORMAL HIGH (ref 70–99)
Potassium: 3.7 mmol/L (ref 3.5–5.1)
Sodium: 137 mmol/L (ref 135–145)
Total Bilirubin: 1.1 mg/dL (ref 0.3–1.2)
Total Protein: 4.8 g/dL — ABNORMAL LOW (ref 6.5–8.1)

## 2020-12-27 LAB — CBC
HCT: 23.1 % — ABNORMAL LOW (ref 39.0–52.0)
Hemoglobin: 7.6 g/dL — ABNORMAL LOW (ref 13.0–17.0)
MCH: 30.2 pg (ref 26.0–34.0)
MCHC: 32.9 g/dL (ref 30.0–36.0)
MCV: 91.7 fL (ref 80.0–100.0)
Platelets: 62 10*3/uL — ABNORMAL LOW (ref 150–400)
RBC: 2.52 MIL/uL — ABNORMAL LOW (ref 4.22–5.81)
RDW: 20 % — ABNORMAL HIGH (ref 11.5–15.5)
WBC: 6.8 10*3/uL (ref 4.0–10.5)
nRBC: 0 % (ref 0.0–0.2)

## 2020-12-27 LAB — GLUCOSE, CAPILLARY
Glucose-Capillary: 113 mg/dL — ABNORMAL HIGH (ref 70–99)
Glucose-Capillary: 110 mg/dL — ABNORMAL HIGH (ref 70–99)
Glucose-Capillary: 104 mg/dL — ABNORMAL HIGH (ref 70–99)
Glucose-Capillary: 103 mg/dL — ABNORMAL HIGH (ref 70–99)

## 2020-12-27 LAB — C-REACTIVE PROTEIN: CRP: 13.6 mg/dL — ABNORMAL HIGH (ref <1.0)

## 2020-12-27 MED ORDER — POTASSIUM CHLORIDE CRYS ER 20 MEQ PO TBCR
40.0000 meq | EXTENDED_RELEASE_TABLET | Freq: Once | ORAL | Status: AC
  Administered 2020-12-27: 40 meq via ORAL
  Filled 2020-12-27: qty 2

## 2020-12-27 MED ORDER — OXYCODONE HCL 5 MG PO TABS
5.0000 mg | ORAL_TABLET | ORAL | Status: DC | PRN
  Administered 2020-12-27 – 2020-12-31 (×10): 5 mg via ORAL
  Filled 2020-12-27 (×11): qty 1

## 2020-12-27 NOTE — Progress Notes (Signed)
Progress Note  7 Days Post-Op  Subjective: Asked to come back and evaluate patient's chronic sacral wound. States that this has been present for about 2 months and he has only been getting local wound care outside the hospital. He has currently had a few days of hydrotherapy.   Objective: Vital signs in last 24 hours: Temp:  [97.5 F (36.4 C)-98.2 F (36.8 C)] 98.1 F (36.7 C) (01/27 0735) Pulse Rate:  [59-67] 61 (01/27 0345) Resp:  [10-147] 18 (01/27 0345) BP: (65-123)/(40-76) 123/76 (01/27 0735) SpO2:  [94 %-100 %] 98 % (01/27 0345) Weight:  [67.4 kg-68.2 kg] 68 kg (01/27 0237) Last BM Date: 12/26/20  Intake/Output from previous day: 01/26 0701 - 01/27 0700 In: -  Out: 800  Intake/Output this shift: No intake/output data recorded.  PE: Gen: Alert, NAD, pleasant HEENT: EOM's intact, pupils equal and round Pulm: rate and effort normal Abd: Soft,ND,appropriately tender, +BS,midline incision cdi with staples intact and no erythema or drainage GU: sacral wound as seen below. About 4cm circumferential tunneling with one area at the 5 o'clock position that tunnels 11cm, 6cm tunneling at the 7 o'clock position with some purulent drainage. No cellulitis. Necrotic tissue lines the edges. Some of the fibrinous appearing tissue at wound base may be periosteum       Lab Results:  Recent Labs    12/26/20 0146 12/27/20 0214  WBC 5.8 6.8  HGB 7.6* 7.6*  HCT 24.3* 23.1*  PLT 67* 62*   BMET Recent Labs    12/26/20 0146 12/27/20 0214  NA 135 137  K 3.9 3.7  CL 103 102  CO2 16* 21*  GLUCOSE 116* 153*  BUN 59* 36*  CREATININE 4.54* 3.43*  CALCIUM 7.0* 7.3*   PT/INR No results for input(s): LABPROT, INR in the last 72 hours. CMP     Component Value Date/Time   NA 137 12/27/2020 0214   K 3.7 12/27/2020 0214   CL 102 12/27/2020 0214   CO2 21 (L) 12/27/2020 0214   GLUCOSE 153 (H) 12/27/2020 0214   BUN 36 (H) 12/27/2020 0214   CREATININE 3.43 (H) 12/27/2020  0214   CALCIUM 7.3 (L) 12/27/2020 0214   PROT 4.8 (L) 12/27/2020 0214   ALBUMIN 1.8 (L) 12/27/2020 0214   AST 30 12/27/2020 0214   ALT 14 12/27/2020 0214   ALKPHOS 127 (H) 12/27/2020 0214   BILITOT 1.1 12/27/2020 0214   GFRNONAA 18 (L) 12/27/2020 0214   GFRAA 19 (L) 06/15/2018 0407   Lipase     Component Value Date/Time   LIPASE 14 12/16/2020 0040       Studies/Results: No results found.  Anti-infectives: Anti-infectives (From admission, onward)   Start     Dose/Rate Route Frequency Ordered Stop   12/04/2020 1500  piperacillin-tazobactam (ZOSYN) IVPB 3.375 g        3.375 g 12.5 mL/hr over 240 Minutes Intravenous Every 12 hours 12/28/2020 1219 12/24/20 2001   12/25/2020 1200  piperacillin-tazobactam (ZOSYN) IVPB 2.25 g  Status:  Discontinued        2.25 g 100 mL/hr over 30 Minutes Intravenous Every 8 hours 12/22/2020 0205 12/14/2020 1219   12/23/2020 0215  piperacillin-tazobactam (ZOSYN) IVPB 3.375 g        3.375 g 100 mL/hr over 30 Minutes Intravenous  Once 12/13/2020 0205 12/14/2020 0435       Assessment/Plan ESRD - HD TTS DM2 Paroxysmal A fib Orthostatic hypotension Chronic diarrhea- extensive work up by GI fairly negative. colonoscopy 11/21/20 only revealedlarge  ulceration involving distal sigmoid colon, path and GI panel negative at the time. Continue fiber   Pneumoperitoneum, pneumatosis, portal venous gas S/p negativeexploratory laparotomy1/20 Dr. Bobbye Morton -POD#7 - intraop: no acute findings, no contamination, excellent blood flow to intestine,congested rectosigmoid colon - Tolerating renal diet and having bowel function.  Chronic stage IV sacral decubitus pressure ulcer - wound evaluated today - no indication for emergent surgical debridement but patient would benefit from continuing hydrotherapy for a few more days. I don't think he any further imaging - d/c dakins and continue BID wet to dry dressing changes with saline  ID -zosyn 1/20>1/25 FEN -renal diet,  Boost VTE -SCDs Foley -none  LOS: 7 days    Margie Billet, Surgery [facility] 12/27/2020, 9:13 AM Please see Amion for pager number during day hours 7:00am-4:30pm

## 2020-12-27 NOTE — Progress Notes (Addendum)
Nutrition Follow-up  INTERVENTION:  Provide Ensure Enlive po TID, each supplement provides 350 kcal and 20 grams of protein.  Continue 30 ml Prosource plus po BID, each supplement provides 100 kcal and 15 grams of protein.   Encourage adequate PO intake.   NUTRITION DIAGNOSIS:   Increased nutrient needs related to wound healing,catabolic illness,chronic illness as evidenced by estimated needs; ongoing  GOAL:   Patient will meet greater than or equal to 90% of their needs; progressing  MONITOR:   PO intake,Supplement acceptance,Skin,Weight trends,Labs,I & O's  REASON FOR ASSESSMENT:   Malnutrition Screening Tool    ASSESSMENT:   70 yo male admitted with with pneumoperitoneum, shock, acute metabolic acidosis, respiratory failure due to volume overload- ESRD/HD (started 10/2020), Pt missed HD x 2 prior to admission.Pt also COVID+.  PMH includes DM, unstageable sacral PI  1/20 Ex lap with pneumoperitoneum and congested rectosigmoid colon, no perforated viscous found  Pt with chronic sacral wound. Plans to continue hydrotherapy. Meal completion has been varied from 25-60%. Pt currently has Boost Breeze and Prosource plus ordered and has been consuming them. RD to order Ensure instead of Boost Breeze to aid in increased caloric and protein needs as well as in wound healing.   Labs and medications reviewed.   Diet Order:   Diet Order            Diet renal with fluid restriction Fluid restriction: 1200 mL Fluid; Room service appropriate? Yes; Fluid consistency: Thin  Diet effective now                 EDUCATION NEEDS:   Not appropriate for education at this time  Skin:  Skin Assessment: Reviewed RN Assessment Skin Integrity Issues:: Unstageable Unstageable: sacrum  Last BM:  1/27  Height:   Ht Readings from Last 1 Encounters:  12/22/20 6\' 2"  (1.88 m)    Weight:   Wt Readings from Last 1 Encounters:  12/27/20 68 kg   BMI:  Body mass index is 19.25  kg/m.  Estimated Nutritional Needs:   Kcal:  2000-2200  kcals  Protein:  100-120 g  Fluid:  1000 mL plus UOP  Corrin Parker, MS, RD, LDN RD pager number/after hours weekend pager number on Amion.

## 2020-12-27 NOTE — Progress Notes (Signed)
Physical Therapy Wound Treatment Patient Details  Name: [name] MRN: [id number] Date of Birth: 12-15-1950  Today's Date: 12/27/2020 Time: 0300-9233 Time Calculation (min): 39 min  Subjective  Subjective: Pleasant and agreeable to wound care. Patient and Family Stated Goals: heal wound Date of Onset:  (unknown - pt reports at least a month) Prior Treatments: unknown  Pain Score:  premedicated, pain during treatment 2/10  Wound Assessment  Pressure Injury 12/05/20 Coccyx Medial;Upper Unstageable - Full thickness tissue loss in which the base of the injury is covered by slough (yellow, tan, gray, green or brown) and/or eschar (tan, brown or black) in the wound bed. prevous stage 2 on sacrum (Active)  Wound Image   12/26/20 1253  Dressing Type ABD;Barrier Film (skin prep);Gauze (Comment);Moist to dry 12/27/20 1121  Dressing Changed;Clean;Intact 12/27/20 1121  Dressing Change Frequency Twice a day 12/27/20 1121  State of Healing Eschar 12/27/20 1121  Site / Wound Assessment Bleeding;Black;Brown;Red;Yellow 12/27/20 1121  % Wound base Red or Granulating 10% 12/27/20 1121  % Wound base Yellow/Fibrinous Exudate 40% 12/27/20 1121  % Wound base Black/Eschar 50% 12/27/20 1121  % Wound base Other/Granulation Tissue (Comment) 0% 12/26/20 1253  Peri-wound Assessment Erythema (blanchable);Purple 12/27/20 0400  Wound Length (cm) 10 cm 12/26/20 1253  Wound Width (cm) 6 cm 12/26/20 1253  Wound Depth (cm) 1 cm 12/26/20 1253  Wound Surface Area (cm^2) 60 cm^2 12/26/20 1253  Wound Volume (cm^3) 60 cm^3 12/26/20 1253  Tunneling (cm) 5 cm at 8 o clock 12/26/20 1253  Undermining (cm) 1-2 cm at 12-5 and 7-12 12/27/20 1121  Margins Unattached edges (unapproximated) 12/27/20 1121  Drainage Amount Moderate 12/27/20 1121  Drainage Description Serous;Serosanguineous;Odor 12/27/20 1121  Treatment Cleansed;Debridement (Selective);Hydrotherapy (Pulse lavage);Packing (Saline gauze);Other (Comment) 12/27/20  1121     Santyl applied to wound bed prior to applying dressing.  Hydrotherapy Pulsed lavage therapy - wound location: sacrum Pulsed Lavage with Suction (psi): 12 psi (4-12) Pulsed Lavage with Suction - Normal Saline Used: 1000 mL Pulsed Lavage Tip: Tip with splash shield Selective Debridement Selective Debridement - Location: sacrum Selective Debridement - Tools Used: Scalpel;Scissors;Forceps Selective Debridement - Tissue Removed: eschar, brown slough, black necrotic edges   Wound Assessment and Plan  Wound Therapy - Assess/Plan/Recommendations Wound Therapy - Clinical Statement: Wound cleaned and debrided extensively to with end at an improved status, This would needs a few more days for hydro and selective debridement to both clean up necrosis and decrease the bioburden. Wound Therapy - Functional Problem List: global weakness and immobility Factors Delaying/Impairing Wound Healing: Immobility;Multiple medical problems Hydrotherapy Plan: Debridement;Dressing change;Patient/family education;Pulsatile lavage with suction Wound Therapy - Frequency: 6X / week Wound Therapy - Follow Up Recommendations: Skilled nursing facility Wound Plan: [name] d/c'd, but discharged held to continue debridement and management of notable infection.  Wound Therapy Goals- Improve the function of patient's integumentary system by progressing the wound(s) through the phases of wound healing (inflammation - proliferation - remodeling) by: Decrease Necrotic Tissue to: 75 Decrease Necrotic Tissue - Progress: Progressing toward goal Increase Granulation Tissue to: 25 Increase Granulation Tissue - Progress: Progressing toward goal Goals/treatment plan/discharge plan were made with and agreed upon by patient/family: Yes Time For Goal Achievement: 7 days Wound Therapy - Potential for Goals: Good  Goals will be updated until maximal potential achieved or discharge criteria met.  Discharge criteria: when goals  achieved, discharge from hospital, MD decision/surgical intervention, no progress towards goals, refusal/missing three consecutive treatments without notification or medical reason.  GP  12/27/2020  Ginger Carne., PT Acute Rehabilitation Services [phone]  (pager) [phone]  (office)  [name] Nikolos Billig 12/27/2020, 11:31 AM

## 2020-12-27 NOTE — Care Management Important Message (Signed)
Important Message  Patient Details  Name: [name] MRN: [id number] Date of Birth: 1950-12-11   Medicare Important Message Given:  Yes - Important Message mailed due to current National Emergency   Verbal consent obtained due to current National Emergency  Relationship to patient: Self   Call Date: 12/27/20  Time: 1403 Phone: [phone] Outcome: No Answer/Busy Important Message mailed to: Patient address on file    Delorse Lek 12/27/2020, 2:03 PM

## 2020-12-27 NOTE — Plan of Care (Signed)
VSS. Remains on room air. Drsg changed this AM. Q2 turns, patient refused most of his turns. Bed alarm on. Call bell within reach.   Problem: Education: Goal: Knowledge of risk factors and measures for prevention of condition will improve Outcome: Progressing   Problem: Coping: Goal: Psychosocial and spiritual needs will be supported Outcome: Progressing   Problem: Respiratory: Goal: Will maintain a patent airway Outcome: Progressing Goal: Complications related to the disease process, condition or treatment will be avoided or minimized Outcome: Progressing

## 2020-12-27 NOTE — Progress Notes (Signed)
PROGRESS NOTE                                                                                                                                                                                                             Patient Demographics:    [name], is a 70 y.o. male, DOB - February 19, 1951, PRF:[id number]  Outpatient Primary MD for the patient is Patient, No Pcp Per   Admit date - 12/16/2020   LOS - 7  Chief Complaint  Patient presents with  . Vomiting       Brief Narrative: Patient is a 70 y.o. male with PMHx of ESRD, chronic diarrhea (with extensive work-up in the past), orthostatic hypotension, PAF-anticoagulation on hold due to history of GI bleeding, -presented to the hospital from SNF due to abdominal pain, nausea and vomiting-found to have pneumoperitoneum on imaging studies-underwent exploratory laparotomy without any findings of bowel perforation.  He was also found to be COVID-positive.  He was managed in the [place] stability-transferred to the [facility].  COVID-19 vaccinated status:   Significant Events: 1/19>> Admit to [facility] for abdominal pain-pneumoperitoneum on imaging studies-exploratory laparotomy negative 1/22>> transferred to [facility] service.  Significant studies: 1/20>> CT abdomen/pelvis: Focal area of wall thickening within the adjacent perforation of the distal descending colon, free air under the right diaphragm 1/20>> chest x-ray: Hazy density at the bases from small pleural effusions by prior CT.  COVID-19 medications: None  Antibiotics: Zosyn: 1/19>>1/24  Microbiology data: None  Procedures: Exploratory laparotomy 1/20 ETT 1/20>>1/20  Consults: CCS, PCCM, nephrology  DVT prophylaxis: SCDs Start: 12/01/2020 9357    Subjective:   Doing well-no major issues overnight-no chest pain or shortness of breath.  Diarrhea much better per patient.   Assessment  & Plan :    Abdominal pain with nausea/vomiting: Initial concern for perforated viscus-however no evidence of perforated viscus on exploratory laparotomy.  Has tolerated advancement in diet.  No further recommendations by general surgery.  Has finished 5 days of IV Zosyn.  Hypovolemic shock: Resolved-briefly required pressors in the ICU.  Home dose midodrine and Florinef resumed.  Acute hypoxic respiratory failure due to volume overload from missing HD: Currently stable on minimal oxygen.  ESRD: On HD TTS-nephrology following.  Chronic diarrhea: No etiology in spite of extensive GI work-up in the past that any been hospital-Per last  discharge summary-no more work-up recommended.  Diarrhea relatively well controlled with Imodium and Metamucil.    Orthostatic hypotension: Chronic issue-on midodrine/Florinef.  Hypokalemia: Due to Florinef and diarrhea-replete and recheck.  Anemia: Secondary to CKD and acute illness-transfuse if less than 7.  Thrombocytopenia:?  Etiology-could have been due to COVID-19-HIT antibody negative.  Platelet count slowly improving.  Follow closely.   PAF with RVR: Back in sinus rhythm-on metoprolol and amiodarone-no longer on anticoagulation due to recent history of GI bleeding-and now with thrombocytopenia.  Follow closely.  Chronic debility/deconditioning: At the SNF for the past few months-extremely weak-Per patient-he is minimally ambulatory for the past few months.  COVID-19 infection: Incidental finding-supportive care at this point.  Continue isolation for 10 days from 1/20-last day on 1/29  Fever: afebrile O2 requirements:  SpO2: 98 % O2 Flow Rate (L/min): 2 L/min FiO2 (%): 40 %   COVID-19 Labs: Recent Labs    12/25/20 0039 12/26/20 0146 12/27/20 0214  CRP 19.4* 14.2* 13.6*    No results found for: BNP  No results for input(s): PROCALCITON in the last 168 hours.  Lab Results  Component Value Date   [place] (A) 12/26/2020   [place]  NEGATIVE 12/06/2020   [place] NEGATIVE 11/22/2020   [place] NEGATIVE 11/11/2020     Nutrition Problem: Nutrition Problem: Increased nutrient needs Etiology: wound healing,catabolic illness,chronic illness Signs/Symptoms: estimated needs Interventions: MVI,Boost Melene Muller (each supplement provides 350kcal and 20 grams of protein),Refer to RD note for recommendations,Liberalize Diet   Sacral decubitus ulcer: On hydrotherapy per PT-evaluated by general surgery on 1/27-with recommendations to continue with hydrotherapy for few more days before consideration of discharge.  Pressure Injury 12/05/20 Coccyx Medial;Upper Unstageable - Full thickness tissue loss in which the base of the injury is covered by slough (yellow, tan, gray, green or brown) and/or eschar (tan, brown or black) in the wound bed. prevous stage 2 on sacrum (Active)  12/05/20 1000  Location: Coccyx  Location Orientation: Medial;Upper  Staging: Unstageable - Full thickness tissue loss in which the base of the injury is covered by slough (yellow, tan, gray, green or brown) and/or eschar (tan, brown or black) in the wound bed.  Wound Description (Comments): prevous stage 2 on sacrum, now evolved to unstageable. Black eschar covering wound bed  Present on Admission: No    ABG:    Component Value Date/Time   PHART 7.261 (L) 12/14/2020 0905   PCO2ART 30.1 (L) 12/02/2020 0905   PO2ART 149 (H) 12/21/2020 0905   HCO3 13.3 (L) 12/15/2020 0905   TCO2 13 (L) 12/17/2020 0648   ACIDBASEDEF 12.6 (H) 12/24/2020 0905   O2SAT 97.0 12/22/2020 0905    Vent Settings: N/A   Condition - Stable  Family Communication  : Daughter-[name]-[phone]-on 1/27  Code Status :  Full Code  Diet :  Diet Order            Diet renal with fluid restriction Fluid restriction: 1200 mL Fluid; Room service appropriate? Yes; Fluid consistency: Thin  Diet effective now                  Disposition Plan  :   Status is:  Inpatient  Remains inpatient appropriate because:Inpatient level of care appropriate due to severity of illness   Dispo: The patient is from: SNF              Anticipated d/c is to: SNF              Anticipated d/c date  is: > 3 days              Patient currently is not medically stable to d/c.   Difficult to place patient No   Barriers to discharge: Exploratory laparotomy-slowly gaining GI function-not yet ready for discharge.  Antimicorbials  :    Anti-infectives (From admission, onward)   Start     Dose/Rate Route Frequency Ordered Stop   12/09/2020 1500  piperacillin-tazobactam (ZOSYN) IVPB 3.375 g        3.375 g 12.5 mL/hr over 240 Minutes Intravenous Every 12 hours 12/24/2020 1219 12/24/20 2001   12/14/2020 1200  piperacillin-tazobactam (ZOSYN) IVPB 2.25 g  Status:  Discontinued        2.25 g 100 mL/hr over 30 Minutes Intravenous Every 8 hours 12/05/2020 0205 12/12/2020 1219   12/25/2020 0215  piperacillin-tazobactam (ZOSYN) IVPB 3.375 g        3.375 g 100 mL/hr over 30 Minutes Intravenous  Once 12/24/2020 0205 12/04/2020 0435      Inpatient Medications  Scheduled Meds: . (feeding supplement) PROSource Plus  30 mL Oral TID with meals  . amiodarone  200 mg Oral BID  . Chlorhexidine Gluconate Cloth  6 each Topical Q0600  . famotidine  20 mg Oral Daily  . feeding supplement  1 Container Oral TID BM  . fludrocortisone  0.2 mg Oral Daily  . insulin aspart  0-15 Units Subcutaneous TID WC  . insulin aspart  0-5 Units Subcutaneous QHS  . metoprolol tartrate  25 mg Oral BID  . midodrine  10 mg Oral TID WC  . multivitamin  1 tablet Oral QHS  . mupirocin ointment   Nasal BID  . psyllium  1 packet Oral BID  . sodium bicarbonate  100 mEq Intravenous Once  . sodium chloride flush  10-40 mL Intracatheter Q12H   Continuous Infusions: . sodium chloride 10 mL/hr at 12/21/20 1700  . sodium chloride 10 mL/hr at 12/21/20 1700   PRN Meds:.sodium chloride, docusate, fentaNYL (SUBLIMAZE)  injection, loperamide, polyethylene glycol, sodium chloride flush   Time Spent in minutes  25  See all Orders from today for further details   Oren Binet M.D on 12/27/2020 at 2:38 PM  To page go to [email or web address] - use universal password  [facility] -  Office  [phone]    Objective:   Vitals:   12/27/20 0237 12/27/20 0345 12/27/20 0735 12/27/20 1206  BP:  (!) 103/51 123/76 127/74  Pulse:  61    Resp:  18  20  Temp:   98.1 F (36.7 C) 97.9 F (36.6 C)  TempSrc:   Oral Oral  SpO2:  98%    Weight: 68 kg     Height:        Wt Readings from Last 3 Encounters:  12/27/20 68 kg  12/18/20 61.2 kg  12/10/20 66.3 kg     Intake/Output Summary (Last 24 hours) at 12/27/2020 1438 Last data filed at 12/26/2020 1612 Gross per 24 hour  Intake -  Output 800 ml  Net -800 ml     Physical Exam Gen Exam:Alert awake-not in any distress HEENT:atraumatic, normocephalic Chest: B/L clear to auscultation anteriorly CVS:S1S2 regular Abdomen:soft non tender, non distended Extremities:no edema Neurology: Non focal-but with generalized weakness. Skin: no rash  Data Review:    CBC Recent Labs  Lab 12/23/20 0909 12/24/20 0731 12/25/20 0039 12/26/20 0146 12/27/20 0214  WBC 6.9 4.6 4.4 5.8 6.8  HGB 8.3* 7.8* 7.8* 7.6* 7.6*  HCT 26.1* 25.5*  24.4* 24.3* 23.1*  PLT 48* 46* 59* 67* 62*  MCV 94.6 94.8 93.1 95.3 91.7  MCH 30.1 29.0 29.8 29.8 30.2  MCHC 31.8 30.6 32.0 31.3 32.9  RDW 20.7* 20.9* 20.7* 20.8* 20.0*    Chemistries  Recent Labs  Lab 12/23/20 0909 12/24/20 0146 12/25/20 0039 12/26/20 0146 12/27/20 0214  NA 137 137 134* 135 137  K 3.0* 3.3* 2.6* 3.9 3.7  CL 99 103 98 103 102  CO2 21* 18* 20* 16* 21*  GLUCOSE 91 133* 155* 116* 153*  BUN 48* 32* 47* 59* 36*  CREATININE 4.31* 3.00* 3.91* 4.54* 3.43*  CALCIUM 6.7* 6.6* 6.9* 7.0* 7.3*  MG  --   --   --  1.6*  --   AST 21 30 19 27 30   ALT 11 9 11 13 14   ALKPHOS 105 84 112 125 127*  BILITOT 0.9 0.6  0.9 0.7 1.1   ------------------------------------------------------------------------------------------------------------------ No results for input(s): CHOL, HDL, LDLCALC, TRIG, CHOLHDL, LDLDIRECT in the last 72 hours.  Lab Results  Component Value Date   HGBA1C 4.1 (L) 12/21/2020   ------------------------------------------------------------------------------------------------------------------ No results for input(s): TSH, T4TOTAL, T3FREE, THYROIDAB in the last 72 hours.  Invalid input(s): FREET3 ------------------------------------------------------------------------------------------------------------------ No results for input(s): VITAMINB12, FOLATE, FERRITIN, TIBC, IRON, RETICCTPCT in the last 72 hours.  Coagulation profile No results for input(s): INR, PROTIME in the last 168 hours.  No results for input(s): DDIMER in the last 72 hours.  Cardiac Enzymes No results for input(s): CKMB, TROPONINI, MYOGLOBIN in the last 168 hours.  Invalid input(s): CK ------------------------------------------------------------------------------------------------------------------ No results found for: BNP  Micro Results Recent Results (from the past 240 hour(s))  Resp Panel by RT-PCR (Flu A&B, Covid) Nasopharyngeal Swab     Status: Abnormal   Collection Time: 12/10/2020  2:50 AM   Specimen: Nasopharyngeal Swab; Nasopharyngeal(NP) swabs in vial transport medium  Result Value Ref Range Status   SARS Coronavirus 2 by RT PCR POSITIVE (A) NEGATIVE Final    Comment: RESULT CALLED TO, READ BACK BY AND VERIFIED WITH: C YELVERTON RN 12/12/2020 0349 JDW (NOTE) SARS-CoV-2 target nucleic acids are DETECTED.  The SARS-CoV-2 RNA is generally detectable in upper respiratory specimens during the acute phase of infection. Positive results are indicative of the presence of the identified virus, but do not rule out bacterial infection or co-infection with other pathogens not detected by the test. Clinical  correlation with patient history and other diagnostic information is necessary to determine patient infection status. The expected result is Negative.  Fact Sheet for Patients: EntrepreneurPulse.com.au  Fact Sheet for Healthcare Providers: IncredibleEmployment.be  This test is not yet approved or cleared by the [place] FDA and  has been authorized for detection and/or diagnosis of SARS-CoV-2 by FDA under an Emergency Use Authorization (EUA).  This EUA will remain in effect (meaning this test can be u sed) for the duration of  the COVID-19 declaration under Section 564(b)(1) of the Act, 21 U.S.C. section 360bbb-3(b)(1), unless the authorization is terminated or revoked sooner.     Influenza A by PCR NEGATIVE NEGATIVE Final   Influenza B by PCR NEGATIVE NEGATIVE Final    Comment: (NOTE) The Xpert Xpress SARS-CoV-2/FLU/RSV plus assay is intended as an aid in the diagnosis of influenza from Nasopharyngeal swab specimens and should not be used as a sole basis for treatment. Nasal washings and aspirates are unacceptable for Xpert Xpress SARS-CoV-2/FLU/RSV testing.  Fact Sheet for Patients: EntrepreneurPulse.com.au  Fact Sheet for Healthcare Providers: IncredibleEmployment.be  This test is  not yet approved or cleared by the [place] and has been authorized for detection and/or diagnosis of SARS-CoV-2 by FDA under an Emergency Use Authorization (EUA). This EUA will remain in effect (meaning this test can be used) for the duration of the COVID-19 declaration under Section 564(b)(1) of the Act, 21 U.S.C. section 360bbb-3(b)(1), unless the authorization is terminated or revoked.  Performed at [facility], [place] [address] [place], [place] [address]   MRSA PCR Screening     Status: Abnormal   Collection Time: 12/21/20 11:39 PM  Result Value Ref Range Status   MRSA by PCR POSITIVE (A)  NEGATIVE Final    Comment:        The GeneXpert MRSA Assay (FDA approved for NASAL specimens only), is one component of a comprehensive MRSA colonization surveillance program. It is not intended to diagnose MRSA infection nor to guide or monitor treatment for MRSA infections. RESULT CALLED TO, READ BACK BY AND VERIFIED WITH: D TUCK RN 12/22/20 0134 JDW Performed at [facility], [address] [place], [place] [address]     Radiology Reports CT Abdomen Pelvis Wo Contrast  Result Date: 12/28/2020 CLINICAL DATA:  Abdominal pain EXAM: CT ABDOMEN AND PELVIS WITHOUT CONTRAST TECHNIQUE: Multidetector CT imaging of the abdomen and pelvis was performed following the standard protocol without IV contrast. COMPARISON:  November 07, 2020 and October 22, 2020 FINDINGS: Lower chest: The visualized heart size within normal limits. No pericardial fluid/thickening. No hiatal hernia. Small bilateral pleural effusions are seen. Hepatobiliary: A small amount of pneumobilia is seen in the inferior right liver lobe and surrounding the posterior gallbladder. A distended fluid and contrast filled gallbladder is seen, likely due to vicarious contrast excretion. There is small layering gallstones. Pancreas:  Unremarkable.  No surrounding inflammatory changes. Spleen: Normal in size. Although limited due to the lack of intravenous contrast, normal in appearance. Adrenals/Urinary Tract: Both adrenal glands appear normal. Multiple bilateral renal calculi are again identified. The largest within the upper pole of the right kidney measuring 5 mm and the largest within the left kidney measuring 4 mm within the midpole. No hydronephrosis. The bladder is decompressed with a Foley catheter. Stomach/Bowel: Small amount of contrast reflux seen within the distal esophagus. There is mildly prominent contrast filled loops of jejunum seen within the left upper quadrant without a clear transition point the remainder of the small  bowel is decompressed. There is been interval increased air and stool-filled dilation of the rectum and sigmoid colon to the level of the descending colon junction. At the level of the descending colon/sigmoid colon junction there is focal wall thickening and a small amount of free air seen anteriorly, consistent with a probable transition point and perforation. There is air seen surrounding the stool at the level of the sigmoid rectal junction which could be concerning for pneumatosis. Vascular/Lymphatic: Scattered aortic atherosclerosis is noted. Reproductive: The prostate is unremarkable. Other: There is a moderate amount of free air seen under the hemidiaphragms and is seen surrounding the porta hepatis. Musculoskeletal: Bilateral sacral decubitus ulceration seen over the inferior coccyx with debris and foci of air. There is non loculated fluid in this area. Degenerative changes seen throughout the lumbar spine most notable of L2-L3. IMPRESSION: 1. There is a focal area of wall thickening with adjacent perforation of the distal descending colon at the junction of the sigmoid colon, likely the transition point. 2. There is moderately dilated sigmoid colon and rectum which is stool-filled and  suggestion of possible pneumatosis. 3. Free air seen under the right hemidiaphragm and within the porta hepatis. 4. Small amount of air seen within the inferior right liver lobe which is concerning for portal venous gas given the patient's findings 5. Bilateral sacral decubitus ulceration with phlegmon and subcutaneous emphysema extending to the inferior coccyx. 6. These results were called by telephone at the time of interpretation on 12/23/2020 at 2:01 am to provider PA Sammy , who verbally acknowledged these results. Electronically Signed   By: Prudencio Pair M.D.   On: 12/25/2020 02:06   DG CHEST PORT 1 VIEW  Result Date: 12/15/2020 CLINICAL DATA:  Intubation EXAM: PORTABLE CHEST 1 VIEW COMPARISON:  Ten days ago  FINDINGS: Endotracheal tube with tip at the clavicular heads. The enteric tube reaches the stomach with side port near the GE junction. Dialysis catheter with tips at the upper right atrium. Large volume pneumoperitoneum, known prior CT. Hazy density at the lung bases from pleural fluid primarily based on CT. IMPRESSION: 1. Enteric tube side-port is at the GE junction. Otherwise unremarkable hardware. 2. Hazy density at the bases from small pleural effusions by prior CT. 3. Pneumoperitoneum as seen on prior CT. Electronically Signed   By: Monte Fantasia M.D.   On: 12/15/2020 07:59   DG Chest Port 1 View  Result Date: 12/10/2020 CLINICAL DATA:  Status post exchange of a dialysis catheter. EXAM: PORTABLE CHEST 1 VIEW COMPARISON:  Single-view of the chest 12/07/2020. FINDINGS: New right IJ approach dialysis catheter is in place with its tip at the superior cavoatrial junction. No pneumothorax. Lungs are clear. Heart size is normal. No acute or focal bony abnormality. IMPRESSION: Dialysis catheter tip projects at the superior cavoatrial junction. Negative for pneumothorax or acute disease. Electronically Signed   By: Inge Rise M.D.   On: 12/10/2020 12:09   DG Chest Port 1 View  Result Date: 12/07/2020 CLINICAL DATA:  Post RIGHT-side dialysis catheter exchange EXAM: PORTABLE CHEST 1 VIEW COMPARISON:  Portable exam 1240 hours compared to 1003 hours FINDINGS: RIGHT jugular dual-lumen central venous catheter with tip projecting over SVC. Normal heart size, mediastinal contours, and pulmonary vascularity. Atherosclerotic calcification aorta. Minimal chronic peribronchial thickening without pulmonary infiltrate, pleural effusion, or pneumothorax. Bones mildly demineralized. IMPRESSION: No pneumothorax following RIGHT jugular line placement. Minimal persistent bronchitic changes. Aortic Atherosclerosis (ICD10-I70.0). Electronically Signed   By: Lavonia Dana M.D.   On: 12/07/2020 12:55   DG Chest Port 1  View  Result Date: 12/06/2020 CLINICAL DATA:  End-stage renal disease. EXAM: PORTABLE CHEST 1 VIEW COMPARISON:  October 29, 2020. FINDINGS: The heart size and mediastinal contours are within normal limits. Both lungs are clear. Right internal jugular dialysis catheter is unchanged in position with distal tip in expected position of cavoatrial junction. The visualized skeletal structures are unremarkable. IMPRESSION: No active disease. Electronically Signed   By: Marijo Conception M.D.   On: 12/06/2020 10:34   DG Abd Portable 1V  Result Date: 11/28/2020 CLINICAL DATA:  Abdominal distension abdominal pain EXAM: PORTABLE ABDOMEN - 1 VIEW COMPARISON:  November 07, 2020 CT assessment, abdominal plain film from November 15, 2020 FINDINGS: Decreased distension of bowel loops in the abdomen when compared to the study from November 15, 2020. Mild distension currently of scattered gas-filled loops of small bowel. Suggestion of small amount of proximal rectal gas. No acute skeletal process on limited assessment. Signs of basilar atelectasis. IMPRESSION: Decreased distension of bowel loops in the abdomen when compared to the study from  November 15, 2020. Mild distension currently of scattered gas-filled loops of small bowel may reflect mild ileus. Suggestion of small amount of proximal rectal gas. Electronically Signed   By: Zetta Bills M.D.   On: 11/28/2020 16:15   DG C-Arm 1-60 Min-No Report  Result Date: 12/10/2020 Fluoroscopy was utilized by the requesting physician.  No radiographic interpretation.   DG C-Arm 1-60 Min-No Report  Result Date: 12/07/2020 Fluoroscopy was utilized by the requesting physician.  No radiographic interpretation.

## 2020-12-27 NOTE — Progress Notes (Signed)
CSW cancelled [facility] for tomorrow's dialysis transportation since patient is still hospitalized. [facility] still has him scheduled for pickup Monday from [place] to go to outpatient HD.   Aven Cegielski LCSW
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
## 2020-12-28 DIAGNOSIS — K631 Perforation of intestine (nontraumatic): Secondary | ICD-10-CM | POA: Diagnosis not present

## 2020-12-28 LAB — GLUCOSE, CAPILLARY
Glucose-Capillary: 140 mg/dL — ABNORMAL HIGH (ref 70–99)
Glucose-Capillary: 99 mg/dL (ref 70–99)
Glucose-Capillary: 84 mg/dL (ref 70–99)
Glucose-Capillary: 71 mg/dL (ref 70–99)

## 2020-12-28 MED ORDER — LACTATED RINGERS IV BOLUS
1000.0000 mL | Freq: Once | INTRAVENOUS | Status: AC
  Administered 2020-12-28: 1000 mL via INTRAVENOUS

## 2020-12-28 NOTE — Progress Notes (Addendum)
PROGRESS NOTE                                                                                                                                                                                                             Patient Demographics:    [name], is a 70 y.o. male, DOB - 1951-06-22, XVQ:[id number]  Outpatient Primary MD for the patient is Patient, No Pcp Per   Admit date - 12/07/2020   LOS - 8  Chief Complaint  Patient presents with  . Vomiting       Brief Narrative: Patient is a 70 y.o. male with PMHx of ESRD, chronic diarrhea (with extensive work-up in the past), orthostatic hypotension, PAF-anticoagulation on hold due to history of GI bleeding, -presented to the hospital from SNF due to abdominal pain, nausea and vomiting-found to have pneumoperitoneum on imaging studies-underwent exploratory laparotomy without any findings of bowel perforation.  He was also found to be COVID-positive.  He was managed in the [place] stability-transferred to the [facility].  Significant Events: 1/19>> Admit to [facility] for abdominal pain-pneumoperitoneum on imaging studies-exploratory laparotomy negative 1/22>> transferred to [facility] service. 1/26>> plans to discharge back to SNF-however upon further evaluation-needed inpatient hydrotherapy for worsening sacral wound. 1/27>> general surgery consulted for sacral wound-continue hydrotherapy for few more days.  Significant studies: 1/20>> CT abdomen/pelvis: Focal area of wall thickening within the adjacent perforation of the distal descending colon, free air under the right diaphragm 1/20>> chest x-ray: Hazy density at the bases from small pleural effusions by prior CT.  COVID-19 medications: None  Antibiotics: Zosyn: 1/19>>1/24  Microbiology data: None  Procedures: Exploratory laparotomy 1/20 ETT 1/20>>1/20  Consults: CCS, PCCM, nephrology, wound care  DVT  prophylaxis: SCDs Start: 12/14/2020 0649    Subjective:   Area has improved with Imodium and fiber/Metamucil.   Assessment  & Plan :   Abdominal pain with nausea/vomiting: Initial concern for perforated viscus-however no evidence of perforated viscus on exploratory laparotomy.  Has tolerated advancement in diet.  No further recommendations by general surgery.  Has finished 5 days of IV Zosyn.  Hypovolemic shock: Resolved-briefly required pressors in the ICU.  Home dose midodrine and Florinef resumed.  Acute hypoxic respiratory failure due to volume overload from missing HD: Currently stable on minimal oxygen.  ESRD: On HD TTS-nephrology following.  Chronic diarrhea: No  etiology in spite of extensive GI work-up in the past that any been hospital-Per last discharge summary-no more work-up recommended.  Diarrhea relatively well controlled with Imodium and Metamucil.    Orthostatic hypotension: Chronic issue-on midodrine/Florinef.  Per nephrology-on stretcher dialysis due to inability to stand up/sit up.  Hypokalemia: Due to Florinef and diarrhea-replete and recheck.  Anemia: Secondary to CKD and acute illness-transfuse if less than 7.  Thrombocytopenia:?  Etiology-could have been due to COVID-19-HIT antibody negative.  Platelet count slowly improving.  Follow closely.   PAF with RVR: Back in sinus rhythm-on metoprolol and amiodarone-no longer on anticoagulation due to recent history of GI bleeding-and now with thrombocytopenia.  Follow closely.  Chronic debility/deconditioning: At the SNF for the past few months-extremely weak-Per patient-he is minimally ambulatory for the past few months.  COVID-19 infection: Incidental finding-supportive care at this point.  Continue isolation for 10 days from 1/20-last day on 1/29  Fever: afebrile O2 requirements:  SpO2: 95 % O2 Flow Rate (L/min): 2 L/min FiO2 (%): 40 %   COVID-19 Labs: Recent Labs    12/26/20 0146 12/27/20 0214  CRP 14.2*  13.6*    No results found for: BNP  No results for input(s): PROCALCITON in the last 168 hours.  Lab Results  Component Value Date   [place] (A) 12/14/2020   [place] NEGATIVE 12/06/2020   [place] NEGATIVE 11/22/2020   [place] NEGATIVE 11/11/2020     Nutrition Problem: Nutrition Problem: Increased nutrient needs Etiology: wound healing,catabolic illness,chronic illness Signs/Symptoms: estimated needs Interventions: Ensure Enlive (each supplement provides 350kcal and 20 grams of protein),Prostat   Sacral decubitus ulcer: On hydrotherapy per PT-evaluated by general surgery on 1/27-with recommendations to continue with hydrotherapy for few more days before consideration of discharge.  Addendum: Discussed with general surgery on 1/28-continue with hydrotherapy-General surgery will reassess on 1/31-and provide further recommendations regarding wound care.  Pressure Injury 12/05/20 Coccyx Medial;Upper Unstageable - Full thickness tissue loss in which the base of the injury is covered by slough (yellow, tan, gray, green or brown) and/or eschar (tan, brown or black) in the wound bed. prevous stage 2 on sacrum (Active)  12/05/20 1000  Location: Coccyx  Location Orientation: Medial;Upper  Staging: Unstageable - Full thickness tissue loss in which the base of the injury is covered by slough (yellow, tan, gray, green or brown) and/or eschar (tan, brown or black) in the wound bed.  Wound Description (Comments): prevous stage 2 on sacrum, now evolved to unstageable. Black eschar covering wound bed  Present on Admission: No    ABG:    Component Value Date/Time   PHART 7.261 (L) 12/31/2020 0905   PCO2ART 30.1 (L) 12/28/2020 0905   PO2ART 149 (H) 12/01/2020 0905   HCO3 13.3 (L) 12/09/2020 0905   TCO2 13 (L) 12/09/2020 0648   ACIDBASEDEF 12.6 (H) 12/01/2020 0905   O2SAT 97.0 12/31/2020 0905    Vent Settings: N/A   Condition - Stable  Family Communication  :  Daughter-[name]-[phone]-on 1/27  Code Status :  Full Code  Diet :  Diet Order            Diet renal with fluid restriction Fluid restriction: 1200 mL Fluid; Room service appropriate? Yes; Fluid consistency: Thin  Diet effective now                  Disposition Plan  :   Status is: Inpatient  Remains inpatient appropriate because:Inpatient level of care appropriate due to severity of illness   Dispo: The  patient is from: SNF              Anticipated d/c is to: SNF              Anticipated d/c date is: > 3 days              Patient currently is not medically stable to d/c.   Difficult to place patient No   Barriers to discharge: Needs inpatient hydrotherapy-for for sacral wound stabilization before consideration of discharge to SNF.  Antimicorbials  :    Anti-infectives (From admission, onward)   Start     Dose/Rate Route Frequency Ordered Stop   12/18/2020 1500  piperacillin-tazobactam (ZOSYN) IVPB 3.375 g        3.375 g 12.5 mL/hr over 240 Minutes Intravenous Every 12 hours 12/14/2020 1219 12/24/20 2001   12/29/2020 1200  piperacillin-tazobactam (ZOSYN) IVPB 2.25 g  Status:  Discontinued        2.25 g 100 mL/hr over 30 Minutes Intravenous Every 8 hours 12/16/2020 0205 12/08/2020 1219   12/10/2020 0215  piperacillin-tazobactam (ZOSYN) IVPB 3.375 g        3.375 g 100 mL/hr over 30 Minutes Intravenous  Once 12/01/2020 0205 12/04/2020 0435      Inpatient Medications  Scheduled Meds: . (feeding supplement) PROSource Plus  30 mL Oral TID with meals  . amiodarone  200 mg Oral BID  . Chlorhexidine Gluconate Cloth  6 each Topical Q0600  . famotidine  20 mg Oral Daily  . feeding supplement  1 Container Oral TID BM  . fludrocortisone  0.2 mg Oral Daily  . insulin aspart  0-15 Units Subcutaneous TID WC  . insulin aspart  0-5 Units Subcutaneous QHS  . metoprolol tartrate  25 mg Oral BID  . midodrine  10 mg Oral TID WC  . multivitamin  1 tablet Oral QHS  . mupirocin ointment    Nasal BID  . psyllium  1 packet Oral BID  . sodium bicarbonate  100 mEq Intravenous Once  . sodium chloride flush  10-40 mL Intracatheter Q12H   Continuous Infusions: . sodium chloride 10 mL/hr at 12/21/20 1700  . sodium chloride 10 mL/hr at 12/21/20 1700   PRN Meds:.sodium chloride, docusate, fentaNYL (SUBLIMAZE) injection, loperamide, oxyCODONE, polyethylene glycol, sodium chloride flush   Time Spent in minutes  15  See all Orders from today for further details   Oren Binet M.D on 12/28/2020 at 2:07 PM  To page go to [email or web address] - use universal password  [facility] -  Office  [phone]    Objective:   Vitals:   12/28/20 0005 12/28/20 0426 12/28/20 0752 12/28/20 1213  BP: 108/66 99/70 117/66 104/61  Pulse: 69 80 65 74  Resp: 15 18 17 19   Temp: 98.3 F (36.8 C) 98.2 F (36.8 C) 98.2 F (36.8 C) 98.1 F (36.7 C)  TempSrc: Oral Oral Oral Oral  SpO2: 99% 96% 98% 95%  Weight:      Height:        Wt Readings from Last 3 Encounters:  12/27/20 68 kg  12/18/20 61.2 kg  12/10/20 66.3 kg     Intake/Output Summary (Last 24 hours) at 12/28/2020 1407 Last data filed at 12/28/2020 3546 Gross per 24 hour  Intake 120 ml  Output --  Net 120 ml     Physical Exam Gen Exam:Alert awake-not in any distress HEENT:atraumatic, normocephalic Chest: B/L clear to auscultation anteriorly CVS:S1S2 regular Abdomen:soft non tender, non distended Extremities:no edema Neurology:  Non focal-but with generalized weakness. Skin: no rash   Data Review:    CBC Recent Labs  Lab 12/23/20 0909 12/24/20 0731 12/25/20 0039 12/26/20 0146 12/27/20 0214  WBC 6.9 4.6 4.4 5.8 6.8  HGB 8.3* 7.8* 7.8* 7.6* 7.6*  HCT 26.1* 25.5* 24.4* 24.3* 23.1*  PLT 48* 46* 59* 67* 62*  MCV 94.6 94.8 93.1 95.3 91.7  MCH 30.1 29.0 29.8 29.8 30.2  MCHC 31.8 30.6 32.0 31.3 32.9  RDW 20.7* 20.9* 20.7* 20.8* 20.0*    Chemistries  Recent Labs  Lab 12/23/20 0909 12/24/20 0146  12/25/20 0039 12/26/20 0146 12/27/20 0214  NA 137 137 134* 135 137  K 3.0* 3.3* 2.6* 3.9 3.7  CL 99 103 98 103 102  CO2 21* 18* 20* 16* 21*  GLUCOSE 91 133* 155* 116* 153*  BUN 48* 32* 47* 59* 36*  CREATININE 4.31* 3.00* 3.91* 4.54* 3.43*  CALCIUM 6.7* 6.6* 6.9* 7.0* 7.3*  MG  --   --   --  1.6*  --   AST 21 30 19 27 30   ALT 11 9 11 13 14   ALKPHOS 105 84 112 125 127*  BILITOT 0.9 0.6 0.9 0.7 1.1   ------------------------------------------------------------------------------------------------------------------ No results for input(s): CHOL, HDL, LDLCALC, TRIG, CHOLHDL, LDLDIRECT in the last 72 hours.  Lab Results  Component Value Date   HGBA1C 4.1 (L) 12/21/2020   ------------------------------------------------------------------------------------------------------------------ No results for input(s): TSH, T4TOTAL, T3FREE, THYROIDAB in the last 72 hours.  Invalid input(s): FREET3 ------------------------------------------------------------------------------------------------------------------ No results for input(s): VITAMINB12, FOLATE, FERRITIN, TIBC, IRON, RETICCTPCT in the last 72 hours.  Coagulation profile No results for input(s): INR, PROTIME in the last 168 hours.  No results for input(s): DDIMER in the last 72 hours.  Cardiac Enzymes No results for input(s): CKMB, TROPONINI, MYOGLOBIN in the last 168 hours.  Invalid input(s): CK ------------------------------------------------------------------------------------------------------------------ No results found for: BNP  Micro Results Recent Results (from the past 240 hour(s))  Resp Panel by RT-PCR (Flu A&B, Covid) Nasopharyngeal Swab     Status: Abnormal   Collection Time: 12/06/2020  2:50 AM   Specimen: Nasopharyngeal Swab; Nasopharyngeal(NP) swabs in vial transport medium  Result Value Ref Range Status   SARS Coronavirus 2 by RT PCR POSITIVE (A) NEGATIVE Final    Comment: RESULT CALLED TO, READ BACK BY AND  VERIFIED WITH: C YELVERTON RN 12/23/2020 0349 JDW (NOTE) SARS-CoV-2 target nucleic acids are DETECTED.  The SARS-CoV-2 RNA is generally detectable in upper respiratory specimens during the acute phase of infection. Positive results are indicative of the presence of the identified virus, but do not rule out bacterial infection or co-infection with other pathogens not detected by the test. Clinical correlation with patient history and other diagnostic information is necessary to determine patient infection status. The expected result is Negative.  Fact Sheet for Patients: EntrepreneurPulse.com.au  Fact Sheet for Healthcare Providers: IncredibleEmployment.be  This test is not yet approved or cleared by the [place] FDA and  has been authorized for detection and/or diagnosis of SARS-CoV-2 by FDA under an Emergency Use Authorization (EUA).  This EUA will remain in effect (meaning this test can be u sed) for the duration of  the COVID-19 declaration under Section 564(b)(1) of the Act, 21 U.S.C. section 360bbb-3(b)(1), unless the authorization is terminated or revoked sooner.     Influenza A by PCR NEGATIVE NEGATIVE Final   Influenza B by PCR NEGATIVE NEGATIVE Final    Comment: (NOTE) The Xpert Xpress SARS-CoV-2/FLU/RSV plus assay is intended as an aid  in the diagnosis of influenza from Nasopharyngeal swab specimens and should not be used as a sole basis for treatment. Nasal washings and aspirates are unacceptable for Xpert Xpress SARS-CoV-2/FLU/RSV testing.  Fact Sheet for Patients: EntrepreneurPulse.com.au  Fact Sheet for Healthcare Providers: IncredibleEmployment.be  This test is not yet approved or cleared by the [place] FDA and has been authorized for detection and/or diagnosis of SARS-CoV-2 by FDA under an Emergency Use Authorization (EUA). This EUA will remain in effect (meaning this test can  be used) for the duration of the COVID-19 declaration under Section 564(b)(1) of the Act, 21 U.S.C. section 360bbb-3(b)(1), unless the authorization is terminated or revoked.  Performed at [facility], [place] [address], [place], [place] [address]   MRSA PCR Screening     Status: Abnormal   Collection Time: 12/21/20 11:39 PM  Result Value Ref Range Status   MRSA by PCR POSITIVE (A) NEGATIVE Final    Comment:        The GeneXpert MRSA Assay (FDA approved for NASAL specimens only), is one component of a comprehensive MRSA colonization surveillance program. It is not intended to diagnose MRSA infection nor to guide or monitor treatment for MRSA infections. RESULT CALLED TO, READ BACK BY AND VERIFIED WITH: D TUCK RN 12/22/20 0134 JDW Performed at [facility], [address], [place], [place] [address]     Radiology Reports CT Abdomen Pelvis Wo Contrast  Result Date: 12/30/2020 CLINICAL DATA:  Abdominal pain EXAM: CT ABDOMEN AND PELVIS WITHOUT CONTRAST TECHNIQUE: Multidetector CT imaging of the abdomen and pelvis was performed following the standard protocol without IV contrast. COMPARISON:  November 07, 2020 and October 22, 2020 FINDINGS: Lower chest: The visualized heart size within normal limits. No pericardial fluid/thickening. No hiatal hernia. Small bilateral pleural effusions are seen. Hepatobiliary: A small amount of pneumobilia is seen in the inferior right liver lobe and surrounding the posterior gallbladder. A distended fluid and contrast filled gallbladder is seen, likely due to vicarious contrast excretion. There is small layering gallstones. Pancreas:  Unremarkable.  No surrounding inflammatory changes. Spleen: Normal in size. Although limited due to the lack of intravenous contrast, normal in appearance. Adrenals/Urinary Tract: Both adrenal glands appear normal. Multiple bilateral renal calculi are again identified. The largest within the upper pole of the right  kidney measuring 5 mm and the largest within the left kidney measuring 4 mm within the midpole. No hydronephrosis. The bladder is decompressed with a Foley catheter. Stomach/Bowel: Small amount of contrast reflux seen within the distal esophagus. There is mildly prominent contrast filled loops of jejunum seen within the left upper quadrant without a clear transition point the remainder of the small bowel is decompressed. There is been interval increased air and stool-filled dilation of the rectum and sigmoid colon to the level of the descending colon junction. At the level of the descending colon/sigmoid colon junction there is focal wall thickening and a small amount of free air seen anteriorly, consistent with a probable transition point and perforation. There is air seen surrounding the stool at the level of the sigmoid rectal junction which could be concerning for pneumatosis. Vascular/Lymphatic: Scattered aortic atherosclerosis is noted. Reproductive: The prostate is unremarkable. Other: There is a moderate amount of free air seen under the hemidiaphragms and is seen surrounding the porta hepatis. Musculoskeletal: Bilateral sacral decubitus ulceration seen over the inferior coccyx with debris and foci of air. There is non loculated fluid in this area. Degenerative changes seen throughout the  lumbar spine most notable of L2-L3. IMPRESSION: 1. There is a focal area of wall thickening with adjacent perforation of the distal descending colon at the junction of the sigmoid colon, likely the transition point. 2. There is moderately dilated sigmoid colon and rectum which is stool-filled and suggestion of possible pneumatosis. 3. Free air seen under the right hemidiaphragm and within the porta hepatis. 4. Small amount of air seen within the inferior right liver lobe which is concerning for portal venous gas given the patient's findings 5. Bilateral sacral decubitus ulceration with phlegmon and subcutaneous emphysema  extending to the inferior coccyx. 6. These results were called by telephone at the time of interpretation on 12/24/2020 at 2:01 am to provider PA Sammy , who verbally acknowledged these results. Electronically Signed   By: Prudencio Pair M.D.   On: 12/30/2020 02:06   DG CHEST PORT 1 VIEW  Result Date: 12/23/2020 CLINICAL DATA:  Intubation EXAM: PORTABLE CHEST 1 VIEW COMPARISON:  Ten days ago FINDINGS: Endotracheal tube with tip at the clavicular heads. The enteric tube reaches the stomach with side port near the GE junction. Dialysis catheter with tips at the upper right atrium. Large volume pneumoperitoneum, known prior CT. Hazy density at the lung bases from pleural fluid primarily based on CT. IMPRESSION: 1. Enteric tube side-port is at the GE junction. Otherwise unremarkable hardware. 2. Hazy density at the bases from small pleural effusions by prior CT. 3. Pneumoperitoneum as seen on prior CT. Electronically Signed   By: Monte Fantasia M.D.   On: 12/02/2020 07:59   DG Chest Port 1 View  Result Date: 12/10/2020 CLINICAL DATA:  Status post exchange of a dialysis catheter. EXAM: PORTABLE CHEST 1 VIEW COMPARISON:  Single-view of the chest 12/07/2020. FINDINGS: New right IJ approach dialysis catheter is in place with its tip at the superior cavoatrial junction. No pneumothorax. Lungs are clear. Heart size is normal. No acute or focal bony abnormality. IMPRESSION: Dialysis catheter tip projects at the superior cavoatrial junction. Negative for pneumothorax or acute disease. Electronically Signed   By: Inge Rise M.D.   On: 12/10/2020 12:09   DG Chest Port 1 View  Result Date: 12/07/2020 CLINICAL DATA:  Post RIGHT-side dialysis catheter exchange EXAM: PORTABLE CHEST 1 VIEW COMPARISON:  Portable exam 1240 hours compared to 1003 hours FINDINGS: RIGHT jugular dual-lumen central venous catheter with tip projecting over SVC. Normal heart size, mediastinal contours, and pulmonary vascularity. Atherosclerotic  calcification aorta. Minimal chronic peribronchial thickening without pulmonary infiltrate, pleural effusion, or pneumothorax. Bones mildly demineralized. IMPRESSION: No pneumothorax following RIGHT jugular line placement. Minimal persistent bronchitic changes. Aortic Atherosclerosis (ICD10-I70.0). Electronically Signed   By: Lavonia Dana M.D.   On: 12/07/2020 12:55   DG Chest Port 1 View  Result Date: 12/06/2020 CLINICAL DATA:  End-stage renal disease. EXAM: PORTABLE CHEST 1 VIEW COMPARISON:  October 29, 2020. FINDINGS: The heart size and mediastinal contours are within normal limits. Both lungs are clear. Right internal jugular dialysis catheter is unchanged in position with distal tip in expected position of cavoatrial junction. The visualized skeletal structures are unremarkable. IMPRESSION: No active disease. Electronically Signed   By: Marijo Conception M.D.   On: 12/06/2020 10:34   DG Abd Portable 1V  Result Date: 11/28/2020 CLINICAL DATA:  Abdominal distension abdominal pain EXAM: PORTABLE ABDOMEN - 1 VIEW COMPARISON:  November 07, 2020 CT assessment, abdominal plain film from November 15, 2020 FINDINGS: Decreased distension of bowel loops in the abdomen when compared to the study  from November 15, 2020. Mild distension currently of scattered gas-filled loops of small bowel. Suggestion of small amount of proximal rectal gas. No acute skeletal process on limited assessment. Signs of basilar atelectasis. IMPRESSION: Decreased distension of bowel loops in the abdomen when compared to the study from November 15, 2020. Mild distension currently of scattered gas-filled loops of small bowel may reflect mild ileus. Suggestion of small amount of proximal rectal gas. Electronically Signed   By: Zetta Bills M.D.   On: 11/28/2020 16:15   DG C-Arm 1-60 Min-No Report  Result Date: 12/10/2020 Fluoroscopy was utilized by the requesting physician.  No radiographic interpretation.   DG C-Arm 1-60 Min-No  Report  Result Date: 12/07/2020 Fluoroscopy was utilized by the requesting physician.  No radiographic interpretation.

## 2020-12-28 NOTE — TOC Progression Note (Signed)
Transition of Care [facility]) - Progression Note    Patient Details  Name: [name] MRN: [id number] Date of Birth: 1951-11-23  Transition of Care [facility]) CM/SW [place], LCSW Phone Number: 12/28/2020, 4:38 PM  Clinical Narrative:    CSW cancelled Monday's Dialysis transport with [facility].    Expected Discharge Plan: [place] Barriers to Discharge: Continued Medical Work up  Expected Discharge Plan and Services Expected Discharge Plan: [place] In-house Referral: Clinical Social Work   Post Acute Care Choice: [place] Living arrangements for the past 2 months: [place] Expected Discharge Date: 12/26/20                                     Social Determinants of Health (SDOH) Interventions    Readmission Risk Interventions Readmission Risk Prevention Plan 12/25/2020  Transportation Screening Complete  Medication Review Press photographer) Referral to Pharmacy  PCP or Specialist appointment within 3-5 days of discharge Complete  HRI or Home Care Consult Complete  SW Recovery Care/Counseling Consult Complete  Palliative Care Screening Not [place] Complete  Some recent data might be hidden
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
## 2020-12-28 NOTE — Progress Notes (Signed)
Physical Therapy Wound Treatment Patient Details  Name: [name] MRN: [id number] Date of Birth: [birth year]  Today's Date: 12/28/2020 Time: 9735-3299 Time Calculation (min): 48 min  Subjective  Subjective: Pleasant and agreeable to wound care. Patient and Family Stated Goals: heal wound Date of Onset:  (unknown - pt reports at least a month) Prior Treatments: unknown  Pain Score: Pain Score: 4/10  Premedicated   Wound Assessment  Pressure Injury 12/05/20 Coccyx Medial;Upper Unstageable - Full thickness tissue loss in which the base of the injury is covered by slough (yellow, tan, gray, green or brown) and/or eschar (tan, brown or black) in the wound bed. prevous stage 2 on sacrum (Active)  Dressing Type ABD;Barrier Film (skin prep);Gauze (Comment);Moist to dry;Santyl 12/28/20 1246  Dressing Clean;Dry;Intact 12/28/20 1246  Dressing Change Frequency Twice a day 12/28/20 1246  State of Healing Eschar 12/28/20 1246  Site / Wound Assessment Bleeding;Black;Yellow;Red 12/28/20 0400  % Wound base Red or Granulating 20% 12/28/20 1246  % Wound base Yellow/Fibrinous Exudate 50% 12/28/20 1246  % Wound base Black/Eschar 30% 12/28/20 1246  % Wound base Other/Granulation Tissue (Comment) 0% 12/26/20 1253  Peri-wound Assessment Erythema (blanchable);Induration 12/28/20 1246  Wound Length (cm) 10 cm 12/26/20 1253  Wound Width (cm) 6 cm 12/26/20 1253  Wound Depth (cm) 1 cm 12/26/20 1253  Wound Surface Area (cm^2) 60 cm^2 12/26/20 1253  Wound Volume (cm^3) 60 cm^3 12/26/20 1253  Tunneling (cm) 5 cm at 8 o clock 12/26/20 1253  Undermining (cm) 1-2 cm at 12-5 and 4-5 cm at 7-11 12/28/20 1246  Margins Unattached edges (unapproximated) 12/28/20 1246  Drainage Amount Moderate 12/28/20 1246  Drainage Description Purulent;Serous;Odor 12/28/20 1246  Treatment Cleansed;Debridement (Selective);Hydrotherapy (Pulse lavage);Irrigation;Packing (Saline gauze) 12/28/20 1246  Santyl applied to wound bed prior to  applying dressing.    Hydrotherapy Pulsed lavage therapy - wound location: sacrum Pulsed Lavage with Suction (psi): 12 psi (4-12) Pulsed Lavage with Suction - Normal Saline Used: 1000 mL Pulsed Lavage Tip: Tip with splash shield Selective Debridement Selective Debridement - Location: sacrum Selective Debridement - Tools Used: Scalpel;Scissors;Forceps Selective Debridement - Tissue Removed: eschar, brown slough, black necrotic edges   Wound Assessment and Plan  Wound Therapy - Assess/Plan/Recommendations Wound Therapy - Clinical Statement: Wound cleaned and debrided extensively to with end at an improved status, This would needs a few more days for hydro and selective debridement to both clean up necrosis and decrease the bioburden. Wound Therapy - Functional Problem List: global weakness and immobility Factors Delaying/Impairing Wound Healing: Immobility;Multiple medical problems Hydrotherapy Plan: Debridement;Dressing change;Patient/family education;Pulsatile lavage with suction Wound Therapy - Frequency: 6X / week Wound Therapy - Follow Up Recommendations: Skilled nursing facility Wound Plan: [name] d/c'd, but discharged held to continue debridement and management of notable infection.  Wound Therapy Goals- Improve the function of patient's integumentary system by progressing the wound(s) through the phases of wound healing (inflammation - proliferation - remodeling) by: Decrease Necrotic Tissue to: 75 Decrease Necrotic Tissue - Progress: Progressing toward goal Increase Granulation Tissue to: 25 Increase Granulation Tissue - Progress: Progressing toward goal Goals/treatment plan/discharge plan were made with and agreed upon by patient/family: Yes Time For Goal Achievement: 7 days Wound Therapy - Potential for Goals: Good  Goals will be updated until maximal potential achieved or discharge criteria met.  Discharge criteria: when goals achieved, discharge from hospital, MD  decision/surgical intervention, no progress towards goals, refusal/missing three consecutive treatments without notification or medical reason.  GP    12/28/2020  Ginger Carne., PT  Acute Rehabilitation Services [phone]  (pager) [phone]  (office) [name] [name] 12/28/2020, 12:54 PM

## 2020-12-28 NOTE — Plan of Care (Signed)
Patient is currently resting in bed. VSS. Remains on room air. Drsg changed this AM. Oxy given PRN. Bed alarm. Call bell within reach.   Problem: Education: Goal: Knowledge of risk factors and measures for prevention of condition will improve Outcome: Progressing   Problem: Coping: Goal: Psychosocial and spiritual needs will be supported Outcome: Progressing   Problem: Respiratory: Goal: Will maintain a patent airway Outcome: Progressing Goal: Complications related to the disease process, condition or treatment will be avoided or minimized Outcome: Progressing

## 2020-12-29 ENCOUNTER — Inpatient Hospital Stay (HOSPITAL_COMMUNITY): Payer: Medicare Other

## 2020-12-29 DIAGNOSIS — K631 Perforation of intestine (nontraumatic): Secondary | ICD-10-CM | POA: Diagnosis not present

## 2020-12-29 LAB — CBC WITH DIFFERENTIAL/PLATELET
Abs Immature Granulocytes: 0.08 10*3/uL — ABNORMAL HIGH (ref 0.00–0.07)
Basophils Absolute: 0 10*3/uL (ref 0.0–0.1)
Basophils Relative: 0 %
Eosinophils Absolute: 0 10*3/uL (ref 0.0–0.5)
Eosinophils Relative: 0 %
HCT: 22.3 % — ABNORMAL LOW (ref 39.0–52.0)
Hemoglobin: 6.5 g/dL — CL (ref 13.0–17.0)
Immature Granulocytes: 1 %
Lymphocytes Relative: 7 %
Lymphs Abs: 0.7 10*3/uL (ref 0.7–4.0)
MCH: 29 pg (ref 26.0–34.0)
MCHC: 29.1 g/dL — ABNORMAL LOW (ref 30.0–36.0)
MCV: 99.6 fL (ref 80.0–100.0)
Monocytes Absolute: 0.5 10*3/uL (ref 0.1–1.0)
Monocytes Relative: 5 %
Neutro Abs: 8.8 10*3/uL — ABNORMAL HIGH (ref 1.7–7.7)
Neutrophils Relative %: 87 %
Platelets: 64 10*3/uL — ABNORMAL LOW (ref 150–400)
RBC: 2.24 MIL/uL — ABNORMAL LOW (ref 4.22–5.81)
RDW: 19 % — ABNORMAL HIGH (ref 11.5–15.5)
WBC: 10.1 10*3/uL (ref 4.0–10.5)
nRBC: 0 % (ref 0.0–0.2)

## 2020-12-29 LAB — RENAL FUNCTION PANEL
Albumin: 1.6 g/dL — ABNORMAL LOW (ref 3.5–5.0)
Anion gap: 13 (ref 5–15)
BUN: 55 mg/dL — ABNORMAL HIGH (ref 8–23)
CO2: 19 mmol/L — ABNORMAL LOW (ref 22–32)
Calcium: 7.5 mg/dL — ABNORMAL LOW (ref 8.9–10.3)
Chloride: 100 mmol/L (ref 98–111)
Creatinine, Ser: 5.05 mg/dL — ABNORMAL HIGH (ref 0.61–1.24)
GFR, Estimated: 12 mL/min — ABNORMAL LOW (ref >60)
Glucose, Bld: 115 mg/dL — ABNORMAL HIGH (ref 70–99)
Phosphorus: 4.9 mg/dL — ABNORMAL HIGH (ref 2.5–4.6)
Potassium: 4.4 mmol/L (ref 3.5–5.1)
Sodium: 132 mmol/L — ABNORMAL LOW (ref 135–145)

## 2020-12-29 LAB — CBC
HCT: 22 % — ABNORMAL LOW (ref 39.0–52.0)
HCT: 27 % — ABNORMAL LOW (ref 39.0–52.0)
Hemoglobin: 6.4 g/dL — CL (ref 13.0–17.0)
Hemoglobin: 8.4 g/dL — ABNORMAL LOW (ref 13.0–17.0)
MCH: 29.4 pg (ref 26.0–34.0)
MCH: 29.8 pg (ref 26.0–34.0)
MCHC: 29.1 g/dL — ABNORMAL LOW (ref 30.0–36.0)
MCHC: 31.1 g/dL (ref 30.0–36.0)
MCV: 100.9 fL — ABNORMAL HIGH (ref 80.0–100.0)
MCV: 95.7 fL (ref 80.0–100.0)
Platelets: 63 10*3/uL — ABNORMAL LOW (ref 150–400)
Platelets: 70 10*3/uL — ABNORMAL LOW (ref 150–400)
RBC: 2.18 MIL/uL — ABNORMAL LOW (ref 4.22–5.81)
RBC: 2.82 MIL/uL — ABNORMAL LOW (ref 4.22–5.81)
RDW: 19.3 % — ABNORMAL HIGH (ref 11.5–15.5)
RDW: 18.3 % — ABNORMAL HIGH (ref 11.5–15.5)
WBC: 10.2 10*3/uL (ref 4.0–10.5)
WBC: 9.6 10*3/uL (ref 4.0–10.5)
nRBC: 0 % (ref 0.0–0.2)
nRBC: 0 % (ref 0.0–0.2)

## 2020-12-29 LAB — PREPARE RBC (CROSSMATCH)

## 2020-12-29 LAB — GLUCOSE, CAPILLARY
Glucose-Capillary: 89 mg/dL (ref 70–99)
Glucose-Capillary: 149 mg/dL — ABNORMAL HIGH (ref 70–99)
Glucose-Capillary: 103 mg/dL — ABNORMAL HIGH (ref 70–99)
Glucose-Capillary: 96 mg/dL (ref 70–99)

## 2020-12-29 LAB — LACTIC ACID, PLASMA: Lactic Acid, Venous: 2.9 mmol/L (ref 0.5–1.9)

## 2020-12-29 LAB — VITAMIN A: Vitamin A (Retinoic Acid): 6 ug/dL — ABNORMAL LOW (ref 22.0–69.5)

## 2020-12-29 MED ORDER — TECHNETIUM TC 99M-LABELED RED BLOOD CELLS IV KIT
23.0000 | PACK | Freq: Once | INTRAVENOUS | Status: AC | PRN
  Administered 2020-12-29: 23 via INTRAVENOUS

## 2020-12-29 MED ORDER — FLUDROCORTISONE ACETATE 0.1 MG PO TABS
0.2000 mg | ORAL_TABLET | Freq: Two times a day (BID) | ORAL | Status: DC
  Administered 2020-12-29 – 2020-12-31 (×4): 0.2 mg via ORAL
  Filled 2020-12-29 (×5): qty 2

## 2020-12-29 MED ORDER — ENSURE ENLIVE PO LIQD
237.0000 mL | Freq: Two times a day (BID) | ORAL | Status: DC
  Administered 2020-12-29 – 2020-12-31 (×5): 237 mL via ORAL

## 2020-12-29 MED ORDER — PYRIDOSTIGMINE BROMIDE 60 MG PO TABS
60.0000 mg | ORAL_TABLET | Freq: Every day | ORAL | Status: DC
Start: 2020-12-29 — End: 2020-12-31
  Administered 2020-12-29 – 2020-12-31 (×3): 60 mg via ORAL
  Filled 2020-12-29 (×3): qty 1

## 2020-12-29 MED ORDER — PANTOPRAZOLE SODIUM 40 MG IV SOLR
40.0000 mg | Freq: Two times a day (BID) | INTRAVENOUS | Status: DC
  Administered 2020-12-29 – 2021-01-02 (×9): 40 mg via INTRAVENOUS
  Filled 2020-12-29 (×9): qty 40

## 2020-12-29 MED ORDER — SODIUM CHLORIDE 0.9% IV SOLUTION: Freq: Once | INTRAVENOUS | Status: AC

## 2020-12-29 MED ORDER — METOPROLOL TARTRATE 12.5 MG HALF TABLET
12.5000 mg | ORAL_TABLET | Freq: Two times a day (BID) | ORAL | Status: DC
  Administered 2020-12-30 – 2020-12-31 (×2): 12.5 mg via ORAL
  Filled 2020-12-29 (×3): qty 1

## 2020-12-29 MED ORDER — PANTOPRAZOLE SODIUM 40 MG PO TBEC
40.0000 mg | DELAYED_RELEASE_TABLET | Freq: Two times a day (BID) | ORAL | Status: DC
  Administered 2020-12-29: 40 mg via ORAL
  Filled 2020-12-29: qty 1

## 2020-12-29 MED ORDER — ROSUVASTATIN CALCIUM 5 MG PO TABS
5.0000 mg | ORAL_TABLET | Freq: Every day | ORAL | Status: DC
  Administered 2020-12-29: 5 mg via ORAL
  Filled 2020-12-29: qty 1

## 2020-12-29 NOTE — Progress Notes (Signed)
Physical Therapy Wound Treatment Patient Details  Name: [name] MRN: [id number] Date of Birth: Aug 07, 1951  Today's Date: 12/29/2020 Time: 6812-7517 Time Calculation (min): 48 min  Subjective  Subjective: "Does it look better?" Patient and Family Stated Goals: heal wound Date of Onset:  (unknown - pt reports at least a month) Prior Treatments: unknown  Pain Score:  6/10  Wound Assessment  Pressure Injury 12/05/20 Coccyx Medial;Upper Unstageable - Full thickness tissue loss in which the base of the injury is covered by slough (yellow, tan, gray, green or brown) and/or eschar (tan, brown or black) in the wound bed. prevous stage 2 on sacrum (Active)  Dressing Type ABD;Barrier Film (skin prep);Gauze (Comment) 12/29/20 1446  Dressing Changed;Dry;Clean;Intact 12/29/20 1446  Dressing Change Frequency Twice a day 12/29/20 1446  State of Healing Eschar 12/29/20 1446  Site / Wound Assessment Bleeding;Black;Yellow;Red 12/29/20 1446  % Wound base Red or Granulating 20% 12/28/20 1246  % Wound base Yellow/Fibrinous Exudate 50% 12/28/20 1246  % Wound base Black/Eschar 30% 12/28/20 1246  % Wound base Other/Granulation Tissue (Comment) 0% 12/26/20 1253  Peri-wound Assessment Erythema (blanchable) 12/29/20 1446  Wound Length (cm) 10 cm 12/26/20 1253  Wound Width (cm) 6 cm 12/26/20 1253  Wound Depth (cm) 1 cm 12/26/20 1253  Wound Surface Area (cm^2) 60 cm^2 12/26/20 1253  Wound Volume (cm^3) 60 cm^3 12/26/20 1253  Tunneling (cm) 5 cm at 8 o clock 12/26/20 1253  Undermining (cm) 1-2 cm at 12-5 and 4-5 cm at 7-11 12/28/20 1246  Margins Unattached edges (unapproximated) 12/29/20 1446  Drainage Amount Moderate 12/29/20 1446  Drainage Description Purulent;Serosanguineous;Odor 12/29/20 1446  Treatment Debridement (Selective);Hydrotherapy (Pulse lavage);Packing (Saline gauze) 12/29/20 1446   Santyl applied to wound bed prior to applying dressing.    Hydrotherapy Pulsed lavage therapy - wound  location: sacrum Pulsed Lavage with Suction (psi): 12 psi (4-12) Pulsed Lavage with Suction - Normal Saline Used: 1000 mL Pulsed Lavage Tip: Tip with splash shield Selective Debridement Selective Debridement - Location: sacrum Selective Debridement - Tools Used: Scalpel;Scissors;Forceps Selective Debridement - Tissue Removed: eschar, brown slough, black necrotic edges   Wound Assessment and Plan  Wound Therapy - Assess/Plan/Recommendations Wound Therapy - Clinical Statement: Wound demonstrating some improvemenet, but needs a few more days for hydro and selective debridement to both clean up necrosis and decrease the bioburden. Wound Therapy - Functional Problem List: global weakness and immobility Factors Delaying/Impairing Wound Healing: Immobility;Multiple medical problems Hydrotherapy Plan: Debridement;Dressing change;Patient/family education;Pulsatile lavage with suction Wound Therapy - Frequency: 6X / week Wound Therapy - Follow Up Recommendations: Skilled nursing facility Wound Plan: [name] d/c'd, but discharged held to continue debridement and management of notable infection.  Wound Therapy Goals- Improve the function of patient's integumentary system by progressing the wound(s) through the phases of wound healing (inflammation - proliferation - remodeling) by: Decrease Necrotic Tissue to: 75 Decrease Necrotic Tissue - Progress: Progressing toward goal Increase Granulation Tissue to: 25 Increase Granulation Tissue - Progress: Progressing toward goal Goals/treatment plan/discharge plan were made with and agreed upon by patient/family: Yes Time For Goal Achievement: 7 days Wound Therapy - Potential for Goals: Good  Goals will be updated until maximal potential achieved or discharge criteria met.  Discharge criteria: when goals achieved, discharge from hospital, MD decision/surgical intervention, no progress towards goals, refusal/missing three consecutive treatments without  notification or medical reason.  GP  Wyona Almas, PT, DPT Acute Rehabilitation Services Pager [phone] Office [phone]      [name] 12/29/2020, 3:05 PM

## 2020-12-29 NOTE — Progress Notes (Addendum)
PROGRESS NOTE                                                                                                                                                                                                             Patient Demographics:    [name], is a 70 y.o. male, DOB - Nov 02, 1951, KKX:[id number]  Outpatient Primary MD for the patient is Patient, No Pcp Per   Admit date - 12/07/2020   LOS - 9  Chief Complaint  Patient presents with  . Vomiting       Brief Narrative: Patient is a 70 y.o. male with PMHx of ESRD, chronic diarrhea (with extensive work-up in the past), orthostatic hypotension, PAF-anticoagulation on hold due to history of GI bleeding, -presented to the hospital from SNF due to abdominal pain, nausea and vomiting-found to have pneumoperitoneum on imaging studies-underwent exploratory laparotomy without any findings of bowel perforation.  He was also found to be COVID-positive.  He was managed in the [facility]-transferred to the [facility].  Significant Events: 1/19>> Admit to [facility] for abdominal pain-pneumoperitoneum on imaging studies-exploratory laparotomy negative 1/22>> transferred to [facility] service. 1/26>> plans to discharge back to SNF-however upon further evaluation-needed inpatient hydrotherapy for worsening sacral wound. 1/27>> general surgery consulted for sacral wound-continue hydrotherapy for few more days.  Significant studies: 1/20>> CT abdomen/pelvis: Focal area of wall thickening within the adjacent perforation of the distal descending colon, free air under the right diaphragm 1/20>> chest x-ray: Hazy density at the bases from small pleural effusions by prior CT.  COVID-19 medications: None  Antibiotics: Zosyn: 1/19>>1/24  Microbiology data: None  Procedures: Exploratory laparotomy 1/20 ETT 1/20>>1/20  Consults: CCS, PCCM, nephrology, wound care  DVT  prophylaxis: SCDs Start: 12/31/2020 0649    Subjective:   Patient in bed, appears comfortable, denies any headache, no fever, no chest pain or pressure, no shortness of breath , no abdominal pain. No focal weakness.   Assessment  & Plan :   Abdominal pain with nausea/vomiting, history of chronic diarrhea, history of blood per rectum with recent colonoscopy and biopsy by Dr. Melony Overly at [facility] in December 2021 showing sigmoid ulcers which were nonspecific upon biopsy review: Initial concern for perforated viscus-however no evidence of perforated viscus on exploratory laparotomy.  Has tolerated advancement in diet.  No further recommendations by general  surgery.  Has finished 5 days of IV Zosyn.  Did have blood per rectum on 12/29/2020 with acute blood loss related anemia on top of anemia of chronic disease.  2 units of packed RBC on 12/29/2020, IV PPI, soft diet and monitor.  Discussed the case with GI physician Dr. Benson Norway.  For now monitor as recent colonoscopy results suggest sigmoid ulceration which is nonspecific.  We will continue to monitor H&H and check tagged RBC scan on 12/29/2020.   Hypovolemic shock: Resolved-briefly required pressors in the ICU.  Home dose midodrine and Florinef resumed.  Acute hypoxic respiratory failure due to volume overload from missing HD: Currently stable on minimal oxygen.  ESRD: On HD TTS-nephrology following.  Chronic diarrhea: No etiology in spite of extensive GI work-up in the past that any been hospital-Per last discharge summary-no more work-up recommended.  Diarrhea relatively well controlled with Imodium and Metamucil.    Orthostatic hypotension: Chronic issue-on midodrine/Florinef.  Per nephrology-on stretcher dialysis due to inability to stand up/sit up.  Midodrine dose increased on 12/29/2020.  Reduced beta-blocker dose on 12/29/2020.  Hypokalemia: Due to Florinef and diarrhea-replete and recheck.  Anemia: Secondary to CKD and acute  illness-transfuse if less than 7.  Thrombocytopenia:?  Etiology-could have been due to COVID-19-HIT antibody negative.  Platelet count slowly improving.  Follow closely.   PAF with RVR: Back in sinus rhythm-on metoprolol and amiodarone-no longer on anticoagulation due to recent history of GI bleeding-and now with thrombocytopenia.  Follow closely.  Chronic debility/deconditioning: At the SNF for the past few months-extremely weak-Per patient-he is minimally ambulatory for the past few months.  COVID-19 infection: Incidental finding-supportive care at this point.  Continue isolation for 10 days from 1/20-last day on 1/29  Fever: afebrile O2 requirements:  SpO2: 97 % O2 Flow Rate (L/min): 2 L/min FiO2 (%): 40 %     Recent Labs  Lab 12/23/20 0909 12/24/20 0146 12/24/20 0731 12/25/20 0039 12/26/20 0146 12/27/20 0214 12/29/20 0446 12/29/20 0728  WBC 6.9  --    < > 4.4 5.8 6.8 10.1 10.2  HGB 8.3*  --    < > 7.8* 7.6* 7.6* 6.5* 6.4*  HCT 26.1*  --    < > 24.4* 24.3* 23.1* 22.3* 22.0*  PLT 48*  --    < > 59* 67* 62* 64* 63*  CRP 20.0* 18.1*  --  19.4* 14.2* 13.6*  --   --   AST 21 30  --  19 27 30   --   --   ALT 11 9  --  11 13 14   --   --   ALKPHOS 105 84  --  112 125 127*  --   --   BILITOT 0.9 0.6  --  0.9 0.7 1.1  --   --   ALBUMIN 1.7* 1.7*  --  1.7* 1.5* 1.8* 1.6*  --   LATICACIDVEN  --   --   --   --   --   --  2.9*  --    < > = values in this interval not displayed.      Nutrition Problem: Nutrition Problem: Increased nutrient needs Etiology: wound healing,catabolic illness,chronic illness Signs/Symptoms: estimated needs Interventions: Ensure Enlive (each supplement provides 350kcal and 20 grams of protein),Prostat   Sacral decubitus ulcer: On hydrotherapy per PT-evaluated by general surgery on 1/27-with recommendations to continue with hydrotherapy for few more days before consideration of discharge.  Addendum: Discussed with general surgery on 1/28-continue with  hydrotherapy-General surgery will  reassess on 1/31-and provide further recommendations regarding wound care.  Pressure Injury 12/05/20 Coccyx Medial;Upper Unstageable - Full thickness tissue loss in which the base of the injury is covered by slough (yellow, tan, gray, green or brown) and/or eschar (tan, brown or black) in the wound bed. prevous stage 2 on sacrum (Active)  12/05/20 1000  Location: Coccyx  Location Orientation: Medial;Upper  Staging: Unstageable - Full thickness tissue loss in which the base of the injury is covered by slough (yellow, tan, gray, green or brown) and/or eschar (tan, brown or black) in the wound bed.  Wound Description (Comments): prevous stage 2 on sacrum, now evolved to unstageable. Black eschar covering wound bed  Present on Admission: No       Condition - Stable  Family Communication  : Daughter-[name]-[phone]-on 1/27  Code Status :  Full Code  Diet :  Diet Order            Diet renal with fluid restriction Fluid restriction: 1200 mL Fluid; Room service appropriate? Yes; Fluid consistency: Thin  Diet effective now                  Disposition Plan  :   Status is: Inpatient  Remains inpatient appropriate because:Inpatient level of care appropriate due to severity of illness   Dispo: The patient is from: SNF              Anticipated d/c is to: SNF              Anticipated d/c date is: > 3 days              Patient currently is not medically stable to d/c.   Difficult to place patient No   Barriers to discharge: Needs inpatient hydrotherapy-for for sacral wound stabilization before consideration of discharge to SNF.  Antimicorbials  :    Anti-infectives (From admission, onward)   Start     Dose/Rate Route Frequency Ordered Stop   12/05/2020 1500  piperacillin-tazobactam (ZOSYN) IVPB 3.375 g        3.375 g 12.5 mL/hr over 240 Minutes Intravenous Every 12 hours 12/13/2020 1219 12/24/20 2001   12/11/2020 1200  piperacillin-tazobactam  (ZOSYN) IVPB 2.25 g  Status:  Discontinued        2.25 g 100 mL/hr over 30 Minutes Intravenous Every 8 hours 12/23/2020 0205 12/13/2020 1219   12/07/2020 0215  piperacillin-tazobactam (ZOSYN) IVPB 3.375 g        3.375 g 100 mL/hr over 30 Minutes Intravenous  Once 12/01/2020 0205 12/15/2020 0435      Inpatient Medications  Scheduled Meds: . (feeding supplement) PROSource Plus  30 mL Oral TID with meals  . sodium chloride   Intravenous Once  . amiodarone  200 mg Oral BID  . Chlorhexidine Gluconate Cloth  6 each Topical Q0600  . famotidine  20 mg Oral Daily  . feeding supplement  1 Container Oral TID BM  . feeding supplement  237 mL Oral BID BM  . fludrocortisone  0.2 mg Oral BID  . insulin aspart  0-15 Units Subcutaneous TID WC  . insulin aspart  0-5 Units Subcutaneous QHS  . [START ON 12/30/2020] metoprolol tartrate  12.5 mg Oral BID  . midodrine  10 mg Oral TID WC  . multivitamin  1 tablet Oral QHS  . mupirocin ointment   Nasal BID  . pantoprazole (PROTONIX) IV  40 mg Intravenous Q12H  . psyllium  1 packet Oral BID  . pyridostigmine  60 mg Oral Daily  . rosuvastatin  5 mg Oral QHS  . sodium bicarbonate  100 mEq Intravenous Once   Continuous Infusions:  PRN Meds:.docusate, fentaNYL (SUBLIMAZE) injection, loperamide, oxyCODONE, polyethylene glycol, sodium chloride flush   Time Spent in minutes  15  See all Orders from today for further details   Lala Lund M.D on 12/29/2020 at 11:44 AM  To page go to [email or web address] - use universal password  [facility] -  Office  [phone]    Objective:   Vitals:   12/29/20 0600 12/29/20 0723 12/29/20 0936 12/29/20 1007  BP: (!) 97/53 (!) 106/59 (!) 83/47 (!) 82/63  Pulse: 60 60 73 63  Resp: 18 16 18 18   Temp: 98.3 F (36.8 C) 98.2 F (36.8 C) 98.4 F (36.9 C) 98.2 F (36.8 C)  TempSrc: Oral Oral Oral Oral  SpO2: 94% 94% 99% 97%  Weight:      Height:        Wt Readings from Last 3 Encounters:  12/27/20 68 kg   12/18/20 61.2 kg  12/10/20 66.3 kg     Intake/Output Summary (Last 24 hours) at 12/29/2020 1144 Last data filed at 12/28/2020 1634 Gross per 24 hour  Intake 60 ml  Output -  Net 60 ml     Physical Exam  Awake Alert, No new F.N deficits, Normal affect [place].AT,PERRAL Supple Neck,No JVD, No cervical lymphadenopathy appriciated.  Symmetrical Chest wall movement, Good air movement bilaterally, CTAB RRR,No Gallops, Rubs or new Murmurs, No Parasternal Heave +ve B.Sounds, Abd Soft, No tenderness, No organomegaly appriciated, No rebound - guarding or rigidity. No Cyanosis, Clubbing or edema, No new Rash or bruise    Data Review:    CBC Recent Labs  Lab 12/25/20 0039 12/26/20 0146 12/27/20 0214 12/29/20 0446 12/29/20 0728  WBC 4.4 5.8 6.8 10.1 10.2  HGB 7.8* 7.6* 7.6* 6.5* 6.4*  HCT 24.4* 24.3* 23.1* 22.3* 22.0*  PLT 59* 67* 62* 64* 63*  MCV 93.1 95.3 91.7 99.6 100.9*  MCH 29.8 29.8 30.2 29.0 29.4  MCHC 32.0 31.3 32.9 29.1* 29.1*  RDW 20.7* 20.8* 20.0* 19.0* 19.3*  LYMPHSABS  --   --   --  0.7  --   MONOABS  --   --   --  0.5  --   EOSABS  --   --   --  0.0  --   BASOSABS  --   --   --  0.0  --     Chemistries  Recent Labs  Lab 12/23/20 0909 12/24/20 0146 12/25/20 0039 12/26/20 0146 12/27/20 0214 12/29/20 0446  NA 137 137 134* 135 137 132*  K 3.0* 3.3* 2.6* 3.9 3.7 4.4  CL 99 103 98 103 102 100  CO2 21* 18* 20* 16* 21* 19*  GLUCOSE 91 133* 155* 116* 153* 115*  BUN 48* 32* 47* 59* 36* 55*  CREATININE 4.31* 3.00* 3.91* 4.54* 3.43* 5.05*  CALCIUM 6.7* 6.6* 6.9* 7.0* 7.3* 7.5*  MG  --   --   --  1.6*  --   --   AST 21 30 19 27 30   --   ALT 11 9 11 13 14   --   ALKPHOS 105 84 112 125 127*  --   BILITOT 0.9 0.6 0.9 0.7 1.1  --    ------------------------------------------------------------------------------------------------------------------ No results for input(s): CHOL, HDL, LDLCALC, TRIG, CHOLHDL, LDLDIRECT in the last 72 hours.  Lab Results  Component  Value Date   HGBA1C 4.1 (L) 12/21/2020   ------------------------------------------------------------------------------------------------------------------  No results for input(s): TSH, T4TOTAL, T3FREE, THYROIDAB in the last 72 hours.  Invalid input(s): FREET3 ------------------------------------------------------------------------------------------------------------------ No results for input(s): VITAMINB12, FOLATE, FERRITIN, TIBC, IRON, RETICCTPCT in the last 72 hours.  Coagulation profile No results for input(s): INR, PROTIME in the last 168 hours.  No results for input(s): DDIMER in the last 72 hours.  Cardiac Enzymes No results for input(s): CKMB, TROPONINI, MYOGLOBIN in the last 168 hours.  Invalid input(s): CK ------------------------------------------------------------------------------------------------------------------ No results found for: BNP  Micro Results Recent Results (from the past 240 hour(s))  Resp Panel by RT-PCR (Flu A&B, Covid) Nasopharyngeal Swab     Status: Abnormal   Collection Time: 12/11/2020  2:50 AM   Specimen: Nasopharyngeal Swab; Nasopharyngeal(NP) swabs in vial transport medium  Result Value Ref Range Status   SARS Coronavirus 2 by RT PCR POSITIVE (A) NEGATIVE Final    Comment: RESULT CALLED TO, READ BACK BY AND VERIFIED WITH: C YELVERTON RN 12/30/2020 0349 JDW (NOTE) SARS-CoV-2 target nucleic acids are DETECTED.  The SARS-CoV-2 RNA is generally detectable in upper respiratory specimens during the acute phase of infection. Positive results are indicative of the presence of the identified virus, but do not rule out bacterial infection or co-infection with other pathogens not detected by the test. Clinical correlation with patient history and other diagnostic information is necessary to determine patient infection status. The expected result is Negative.  Fact Sheet for Patients: EntrepreneurPulse.com.au  Fact Sheet for  Healthcare Providers: IncredibleEmployment.be  This test is not yet approved or cleared by the [place] FDA and  has been authorized for detection and/or diagnosis of SARS-CoV-2 by FDA under an Emergency Use Authorization (EUA).  This EUA will remain in effect (meaning this test can be u sed) for the duration of  the COVID-19 declaration under Section 564(b)(1) of the Act, 21 U.S.C. section 360bbb-3(b)(1), unless the authorization is terminated or revoked sooner.     Influenza A by PCR NEGATIVE NEGATIVE Final   Influenza B by PCR NEGATIVE NEGATIVE Final    Comment: (NOTE) The Xpert Xpress SARS-CoV-2/FLU/RSV plus assay is intended as an aid in the diagnosis of influenza from Nasopharyngeal swab specimens and should not be used as a sole basis for treatment. Nasal washings and aspirates are unacceptable for Xpert Xpress SARS-CoV-2/FLU/RSV testing.  Fact Sheet for Patients: EntrepreneurPulse.com.au  Fact Sheet for Healthcare Providers: IncredibleEmployment.be  This test is not yet approved or cleared by the [place] FDA and has been authorized for detection and/or diagnosis of SARS-CoV-2 by FDA under an Emergency Use Authorization (EUA). This EUA will remain in effect (meaning this test can be used) for the duration of the COVID-19 declaration under Section 564(b)(1) of the Act, 21 U.S.C. section 360bbb-3(b)(1), unless the authorization is terminated or revoked.  Performed at [facility], [place] [address] [place], [place] [address]   MRSA PCR Screening     Status: Abnormal   Collection Time: 12/21/20 11:39 PM  Result Value Ref Range Status   MRSA by PCR POSITIVE (A) NEGATIVE Final    Comment:        The GeneXpert MRSA Assay (FDA approved for NASAL specimens only), is one component of a comprehensive MRSA colonization surveillance program. It is not intended to diagnose MRSA infection nor to guide  or monitor treatment for MRSA infections. RESULT CALLED TO, READ BACK BY AND VERIFIED WITH: D TUCK RN 12/22/20 0134 JDW Performed at [facility], [address] [place], [place] [address]  Radiology Reports CT Abdomen Pelvis Wo Contrast  Result Date: 12/27/2020 CLINICAL DATA:  Abdominal pain EXAM: CT ABDOMEN AND PELVIS WITHOUT CONTRAST TECHNIQUE: Multidetector CT imaging of the abdomen and pelvis was performed following the standard protocol without IV contrast. COMPARISON:  November 07, 2020 and October 22, 2020 FINDINGS: Lower chest: The visualized heart size within normal limits. No pericardial fluid/thickening. No hiatal hernia. Small bilateral pleural effusions are seen. Hepatobiliary: A small amount of pneumobilia is seen in the inferior right liver lobe and surrounding the posterior gallbladder. A distended fluid and contrast filled gallbladder is seen, likely due to vicarious contrast excretion. There is small layering gallstones. Pancreas:  Unremarkable.  No surrounding inflammatory changes. Spleen: Normal in size. Although limited due to the lack of intravenous contrast, normal in appearance. Adrenals/Urinary Tract: Both adrenal glands appear normal. Multiple bilateral renal calculi are again identified. The largest within the upper pole of the right kidney measuring 5 mm and the largest within the left kidney measuring 4 mm within the midpole. No hydronephrosis. The bladder is decompressed with a Foley catheter. Stomach/Bowel: Small amount of contrast reflux seen within the distal esophagus. There is mildly prominent contrast filled loops of jejunum seen within the left upper quadrant without a clear transition point the remainder of the small bowel is decompressed. There is been interval increased air and stool-filled dilation of the rectum and sigmoid colon to the level of the descending colon junction. At the level of the descending colon/sigmoid colon junction there is focal wall  thickening and a small amount of free air seen anteriorly, consistent with a probable transition point and perforation. There is air seen surrounding the stool at the level of the sigmoid rectal junction which could be concerning for pneumatosis. Vascular/Lymphatic: Scattered aortic atherosclerosis is noted. Reproductive: The prostate is unremarkable. Other: There is a moderate amount of free air seen under the hemidiaphragms and is seen surrounding the porta hepatis. Musculoskeletal: Bilateral sacral decubitus ulceration seen over the inferior coccyx with debris and foci of air. There is non loculated fluid in this area. Degenerative changes seen throughout the lumbar spine most notable of L2-L3. IMPRESSION: 1. There is a focal area of wall thickening with adjacent perforation of the distal descending colon at the junction of the sigmoid colon, likely the transition point. 2. There is moderately dilated sigmoid colon and rectum which is stool-filled and suggestion of possible pneumatosis. 3. Free air seen under the right hemidiaphragm and within the porta hepatis. 4. Small amount of air seen within the inferior right liver lobe which is concerning for portal venous gas given the patient's findings 5. Bilateral sacral decubitus ulceration with phlegmon and subcutaneous emphysema extending to the inferior coccyx. 6. These results were called by telephone at the time of interpretation on 12/05/2020 at 2:01 am to provider PA Sammy , who verbally acknowledged these results. Electronically Signed   By: Prudencio Pair M.D.   On: 12/27/2020 02:06   DG CHEST PORT 1 VIEW  Result Date: 12/23/2020 CLINICAL DATA:  Intubation EXAM: PORTABLE CHEST 1 VIEW COMPARISON:  Ten days ago FINDINGS: Endotracheal tube with tip at the clavicular heads. The enteric tube reaches the stomach with side port near the GE junction. Dialysis catheter with tips at the upper right atrium. Large volume pneumoperitoneum, known prior CT. Hazy density at  the lung bases from pleural fluid primarily based on CT. IMPRESSION: 1. Enteric tube side-port is at the GE junction. Otherwise unremarkable hardware. 2. Hazy density at the bases from  small pleural effusions by prior CT. 3. Pneumoperitoneum as seen on prior CT. Electronically Signed   By: Monte Fantasia M.D.   On: 12/29/2020 07:59   DG Chest Port 1 View  Result Date: 12/10/2020 CLINICAL DATA:  Status post exchange of a dialysis catheter. EXAM: PORTABLE CHEST 1 VIEW COMPARISON:  Single-view of the chest 12/07/2020. FINDINGS: New right IJ approach dialysis catheter is in place with its tip at the superior cavoatrial junction. No pneumothorax. Lungs are clear. Heart size is normal. No acute or focal bony abnormality. IMPRESSION: Dialysis catheter tip projects at the superior cavoatrial junction. Negative for pneumothorax or acute disease. Electronically Signed   By: Inge Rise M.D.   On: 12/10/2020 12:09   DG Chest Port 1 View  Result Date: 12/07/2020 CLINICAL DATA:  Post RIGHT-side dialysis catheter exchange EXAM: PORTABLE CHEST 1 VIEW COMPARISON:  Portable exam 1240 hours compared to 1003 hours FINDINGS: RIGHT jugular dual-lumen central venous catheter with tip projecting over SVC. Normal heart size, mediastinal contours, and pulmonary vascularity. Atherosclerotic calcification aorta. Minimal chronic peribronchial thickening without pulmonary infiltrate, pleural effusion, or pneumothorax. Bones mildly demineralized. IMPRESSION: No pneumothorax following RIGHT jugular line placement. Minimal persistent bronchitic changes. Aortic Atherosclerosis (ICD10-I70.0). Electronically Signed   By: Lavonia Dana M.D.   On: 12/07/2020 12:55   DG Chest Port 1 View  Result Date: 12/06/2020 CLINICAL DATA:  End-stage renal disease. EXAM: PORTABLE CHEST 1 VIEW COMPARISON:  October 29, 2020. FINDINGS: The heart size and mediastinal contours are within normal limits. Both lungs are clear. Right internal jugular dialysis  catheter is unchanged in position with distal tip in expected position of cavoatrial junction. The visualized skeletal structures are unremarkable. IMPRESSION: No active disease. Electronically Signed   By: Marijo Conception M.D.   On: 12/06/2020 10:34   DG Abd 2 Views  Result Date: 12/29/2020 CLINICAL DATA:  Free air EXAM: ABDOMEN - 2 VIEW COMPARISON:  None. FINDINGS: Massive pneumoperitoneum persists. There is a large amount of stool visible in the right lower quadrant. No dilated small bowel. IMPRESSION: 1. Persistent massive pneumoperitoneum. No visible small bowel dilatation. 2. Large amount of stool in the right lower quadrant. Electronically Signed   By: Ulyses Jarred M.D.   On: 12/29/2020 00:59   DG C-Arm 1-60 Min-No Report  Result Date: 12/10/2020 Fluoroscopy was utilized by the requesting physician.  No radiographic interpretation.   DG C-Arm 1-60 Min-No Report  Result Date: 12/07/2020 Fluoroscopy was utilized by the requesting physician.  No radiographic interpretation.
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
## 2020-12-29 NOTE — Progress Notes (Addendum)
HOSPITAL MEDICINE OVERNIGHT EVENT NOTE    Notified by nursing that patient's blood pressures have dropped dramatically,with SBP's in the 60's.  I have asked that manual blood pressure be obtained which confirms these low blood pressures.  Patient apparently denies any symptoms whatsoever.  Patient is not lethargic and denies lightheadedness.  We will initiate bolus of intravenous fluids, monitoring closely for evidence of volume overload considering patient has end-stage renal disease.  Per my review of the chart, it turns out the patient also exhibited substantial hypotension upon admission when the patient was evaluated for significant pneumoperitoneum.  We will also order repeat abdominal films.  Vernelle Emerald  MD [facility]   ADDENDUM 2:20am 1/29  After a total of 1 L of lactated Ringer's has been bolused for this patient systolic blood pressures have improved substantially, now averaging in the 80s.  X-ray of the abdomen reveals substantial pneumoperitoneum.  Per my review of the chart, it turns out the patient already had an exploratory laparotomy on 1/20 that did not identify a source of the air.  Patient was followed by surgery for several days, received several days of intravenous antibiotics and eventually surgery signed off and recommended outpatient follow-up.  Upon reviewing this x-ray, I urgently went to go evaluate the patient at the bedside.  Other than patient complaining of some sacral wound pain, he denies having any abdominal pain whatsoever.  Patient states he has been eating and drinking normally.  Patient states that he has been moving his bowels frequently, in fact he has a Flexi-Seal in place due to frequent loose stools in the presence of his sacral wound.  Abdominal exam reveals a slightly distended abdomen, hyperresonance percussion.  Patient exhibits hypoactive bowel sounds abdomen is soft however and completely nontender with exception of some tenderness  along the surgical incision which still has staples in place.  Considering patient's benign abdominal exam and lack of symptoms with improving blood pressures after isotonic fluid bolus earlier in the shift I believe close clinical monitoring is warranted for now without repeat surgical intervention.  If patient develops severe hypotension again or develops abdominal symptoms will then consider discussing with surgery tonight.  Otherwise, a request that they reviewed the abdominal films and come reevaluate the patient may be warranted on day shift.  Sherryll Burger Aleathia Purdy  ADDENDUM (1/29 6:50AM)  Hemoglobin this morning is 6.5, down from 7.6 yesterday.    No clinical evidence of bleeding.  BP is continuing to improve , last BP 97/53.   Hgb may have dropped due to hemodilutional effect of giving LR overnight.   1 unit of PRBC ordered with hemodialysis today.  Post transfuse H/H also ordered.  Sherryll Burger Retaj Hilbun

## 2020-12-29 NOTE — Plan of Care (Addendum)
Pt is A/Ox4; on room; 02 sats above 95%; pt has been hypotensive this shift with systolic bp's in the 18'H and 70's; pt has been asymptomatic of bp changes; md notified; LR bolus given; see mar; Pt complained of back pain d/t stage 4 pressure wound; pain meds given; wound care performed per orders; dressing changed per orders; Q2 turns; Pts hgb level is 6.5, MD notified; blood transfusion ordered for pt, per MD give transfusion with dialysis. Day nurse received this report. Will continue to monitor and follow care plan. Per pt, all needs have been met at this time; bed is locked in lowest position, call bell is within reach; 3 side rails up.   Problem: Education: Goal: Knowledge of General Education information will improve Description: Including pain rating scale, medication(s)/side effects and non-pharmacologic comfort measures Outcome: Progressing   Problem: Health Behavior/Discharge Planning: Goal: Ability to manage health-related needs will improve Outcome: Progressing   Problem: Clinical Measurements: Goal: Ability to maintain clinical measurements within normal limits will improve Outcome: Progressing Goal: Will remain free from infection Outcome: Progressing Goal: Diagnostic test results will improve Outcome: Progressing Goal: Respiratory complications will improve Outcome: Progressing Goal: Cardiovascular complication will be avoided Outcome: Progressing   Problem: Activity: Goal: Risk for activity intolerance will decrease Outcome: Progressing   Problem: Nutrition: Goal: Adequate nutrition will be maintained Outcome: Progressing   Problem: Elimination: Goal: Will not experience complications related to bowel motility Outcome: Progressing Goal: Will not experience complications related to urinary retention Outcome: Progressing   Problem: Pain Managment: Goal: General experience of comfort will improve Outcome: Progressing   Problem: Safety: Goal: Ability to remain  free from injury will improve Outcome: Progressing   Problem: Skin Integrity: Goal: Risk for impaired skin integrity will decrease Outcome: Progressing   Problem: Education: Goal: Knowledge of risk factors and measures for prevention of condition will improve Outcome: Progressing   Problem: Coping: Goal: Psychosocial and spiritual needs will be supported Outcome: Progressing   Problem: Respiratory: Goal: Will maintain a patent airway Outcome: Progressing Goal: Complications related to the disease process, condition or treatment will be avoided or minimized Outcome: Progressing   Problem: Education: Goal: Required Educational Video(s) Outcome: Progressing   Problem: Clinical Measurements: Goal: Ability to maintain clinical measurements within normal limits will improve Outcome: Progressing Goal: Postoperative complications will be avoided or minimized Outcome: Progressing   Problem: Skin Integrity: Goal: Demonstration of wound healing without infection will improve Outcome: Progressing   Problem: Education: Goal: Ability to describe self-care measures that may prevent or decrease complications (Diabetes Survival Skills Education) will improve Outcome: Progressing Goal: Individualized Educational Video(s) Outcome: Progressing   Problem: Coping: Goal: Ability to adjust to condition or change in health will improve Outcome: Progressing   Problem: Fluid Volume: Goal: Ability to maintain a balanced intake and output will improve Outcome: Progressing   Problem: Health Behavior/Discharge Planning: Goal: Ability to identify and utilize available resources and services will improve Outcome: Progressing Goal: Ability to manage health-related needs will improve Outcome: Progressing   Problem: Metabolic: Goal: Ability to maintain appropriate glucose levels will improve Outcome: Progressing   Problem: Nutritional: Goal: Maintenance of adequate nutrition will  improve Outcome: Progressing Goal: Progress toward achieving an optimal weight will improve Outcome: Progressing   Problem: Skin Integrity: Goal: Risk for impaired skin integrity will decrease Outcome: Progressing   Problem: Tissue Perfusion: Goal: Adequacy of tissue perfusion will improve Outcome: Progressing

## 2020-12-29 NOTE — Progress Notes (Signed)
Patient off the floor for Bleed scan in Nuclear Medicine

## 2020-12-30 ENCOUNTER — Inpatient Hospital Stay (HOSPITAL_COMMUNITY): Payer: Medicare Other

## 2020-12-30 DIAGNOSIS — K631 Perforation of intestine (nontraumatic): Secondary | ICD-10-CM | POA: Diagnosis not present

## 2020-12-30 LAB — CBC WITH DIFFERENTIAL/PLATELET
Abs Immature Granulocytes: 0.08 10*3/uL — ABNORMAL HIGH (ref 0.00–0.07)
Basophils Absolute: 0 10*3/uL (ref 0.0–0.1)
Basophils Relative: 0 %
Eosinophils Absolute: 0.1 10*3/uL (ref 0.0–0.5)
Eosinophils Relative: 1 %
HCT: 28.1 % — ABNORMAL LOW (ref 39.0–52.0)
Hemoglobin: 9.3 g/dL — ABNORMAL LOW (ref 13.0–17.0)
Immature Granulocytes: 1 %
Lymphocytes Relative: 9 %
Lymphs Abs: 0.8 10*3/uL (ref 0.7–4.0)
MCH: 30.6 pg (ref 26.0–34.0)
MCHC: 33.1 g/dL (ref 30.0–36.0)
MCV: 92.4 fL (ref 80.0–100.0)
Monocytes Absolute: 0.5 10*3/uL (ref 0.1–1.0)
Monocytes Relative: 5 %
Neutro Abs: 7.8 10*3/uL — ABNORMAL HIGH (ref 1.7–7.7)
Neutrophils Relative %: 84 %
Platelets: 71 10*3/uL — ABNORMAL LOW (ref 150–400)
RBC: 3.04 MIL/uL — ABNORMAL LOW (ref 4.22–5.81)
RDW: 18.3 % — ABNORMAL HIGH (ref 11.5–15.5)
WBC: 9.2 10*3/uL (ref 4.0–10.5)
nRBC: 0 % (ref 0.0–0.2)

## 2020-12-30 LAB — BPAM RBC
Blood Product Expiration Date: 202202042359
Blood Product Expiration Date: 202202152359
ISSUE DATE / TIME: 202201290945
ISSUE DATE / TIME: 202201292118
Unit Type and Rh: 600
Unit Type and Rh: 6200

## 2020-12-30 LAB — MAGNESIUM: Magnesium: 2.1 mg/dL (ref 1.7–2.4)

## 2020-12-30 LAB — COMPREHENSIVE METABOLIC PANEL
ALT: 175 U/L — ABNORMAL HIGH (ref 0–44)
AST: 239 U/L — ABNORMAL HIGH (ref 15–41)
Albumin: 1.7 g/dL — ABNORMAL LOW (ref 3.5–5.0)
Alkaline Phosphatase: 167 U/L — ABNORMAL HIGH (ref 38–126)
Anion gap: 14 (ref 5–15)
BUN: 66 mg/dL — ABNORMAL HIGH (ref 8–23)
CO2: 19 mmol/L — ABNORMAL LOW (ref 22–32)
Calcium: 7.6 mg/dL — ABNORMAL LOW (ref 8.9–10.3)
Chloride: 101 mmol/L (ref 98–111)
Creatinine, Ser: 5.42 mg/dL — ABNORMAL HIGH (ref 0.61–1.24)
GFR, Estimated: 11 mL/min — ABNORMAL LOW (ref >60)
Glucose, Bld: 122 mg/dL — ABNORMAL HIGH (ref 70–99)
Potassium: 4.3 mmol/L (ref 3.5–5.1)
Sodium: 134 mmol/L — ABNORMAL LOW (ref 135–145)
Total Bilirubin: 1.3 mg/dL — ABNORMAL HIGH (ref 0.3–1.2)
Total Protein: 4.6 g/dL — ABNORMAL LOW (ref 6.5–8.1)

## 2020-12-30 LAB — TYPE AND SCREEN
ABO/RH(D): A POS
Antibody Screen: NEGATIVE
Unit division: 0
Unit division: 0

## 2020-12-30 LAB — BRAIN NATRIURETIC PEPTIDE: B Natriuretic Peptide: 743.7 pg/mL — ABNORMAL HIGH (ref 0.0–100.0)

## 2020-12-30 LAB — GLUCOSE, CAPILLARY
Glucose-Capillary: 104 mg/dL — ABNORMAL HIGH (ref 70–99)
Glucose-Capillary: 106 mg/dL — ABNORMAL HIGH (ref 70–99)
Glucose-Capillary: 113 mg/dL — ABNORMAL HIGH (ref 70–99)
Glucose-Capillary: 114 mg/dL — ABNORMAL HIGH (ref 70–99)

## 2020-12-30 LAB — C-REACTIVE PROTEIN: CRP: 15.8 mg/dL — ABNORMAL HIGH (ref <1.0)

## 2020-12-30 MED ORDER — POLYETHYLENE GLYCOL 3350 17 G PO PACK
17.0000 g | PACK | Freq: Two times a day (BID) | ORAL | Status: DC
  Administered 2020-12-31: 17 g via ORAL
  Filled 2020-12-30: qty 1

## 2020-12-30 MED ORDER — LOPERAMIDE HCL 2 MG PO CAPS: 2.0000 mg | ORAL_CAPSULE | Freq: Four times a day (QID) | ORAL | Status: DC | PRN

## 2020-12-30 MED ORDER — GUAIFENESIN 100 MG/5ML PO SOLN: 5.0000 mL | ORAL | Status: DC | PRN

## 2020-12-30 MED ORDER — BENZONATATE 100 MG PO CAPS: 200.0000 mg | ORAL_CAPSULE | Freq: Three times a day (TID) | ORAL | Status: DC | PRN

## 2020-12-30 NOTE — Progress Notes (Signed)
PROGRESS NOTE                                                                                                                                                                                                             Patient Demographics:    Nima Kemppainen, is a 70 y.o. male, DOB - [birth year], IOM:[id number]  Outpatient Primary MD for the patient is Patient, No Pcp Per   Admit date - 12/12/2020   LOS - 10  Chief Complaint  Patient presents with  . Vomiting       Brief Narrative: Patient is a 70 y.o. male with PMHx of ESRD, chronic diarrhea (with extensive work-up in the past), orthostatic hypotension, PAF-anticoagulation on hold due to history of GI bleeding, -presented to the hospital from SNF due to abdominal pain, nausea and vomiting-found to have pneumoperitoneum on imaging studies-underwent exploratory laparotomy without any findings of bowel perforation.  He was also found to be COVID-positive.  He was managed in the [place] [facility]-transferred to the [facility].  Significant Events: 1/19>> Admit to [facility] for abdominal pain-pneumoperitoneum on imaging studies-exploratory laparotomy negative 1/22>> transferred to [facility] service. 1/26>> plans to discharge back to SNF-however upon further evaluation-needed inpatient hydrotherapy for worsening sacral wound. 1/27>> general surgery consulted for sacral wound-continue hydrotherapy for few more days.  Significant studies: 1/20>> CT abdomen/pelvis: Focal area of wall thickening within the adjacent perforation of the distal descending colon, free air under the right diaphragm 1/20>> chest x-ray: Hazy density at the bases from small pleural effusions by prior CT.  COVID-19 medications: None  Antibiotics: Zosyn: 1/19>>1/24  Microbiology data: None  Procedures: Exploratory laparotomy 1/20 ETT 1/20>>1/20  Consults: CCS, PCCM, nephrology, wound care  DVT  prophylaxis: SCDs Start: 12/18/2020 0649    Subjective:   Patient in bed, appears comfortable, denies any headache, no fever, no chest pain or pressure, no shortness of breath , no abdominal pain. No focal weakness.   Assessment  & Plan :   Abdominal pain with nausea/vomiting, history of chronic diarrhea, history of blood per rectum with recent colonoscopy and biopsy by Dr. Laural Golden at [facility] in December 2021 showing sigmoid ulcers which were nonspecific upon biopsy review: Initial concern for perforated viscus-however no evidence of perforated viscus on exploratory laparotomy.  Has tolerated advancement in diet.  No further recommendations by general  surgery.  Has finished 5 days of IV Zosyn.  Did have blood per rectum on 12/29/2020 with acute blood loss related anemia on top of anemia of chronic disease.  2 units of packed RBC on 12/29/2020, IV PPI, soft diet and monitor.  Discussed the case with GI physician Dr. Benson Norway.  For now monitor as recent colonoscopy results suggest sigmoid ulceration which is nonspecific.  Negative Tagged RBC scan.  Anemia: Secondary to CKD and acute illness + now acute LGI Bleed - transfused 2 Units 12/29/20.  Hypovolemic shock: Resolved-briefly required pressors in the ICU.  Home dose midodrine and Florinef resumed.  Asymptomatic transaminitis.  Due to hypotension and hypovolemia, transfuse, midodrine, monitor blood pressure, holding statin, RUQ [place] Trend LFTs and INR.  Acute hypoxic respiratory failure due to volume overload from missing HD: Currently stable on minimal oxygen.  ESRD: On HD TTS-nephrology following.  Chronic diarrhea: However abdominal x-ray on 12/29/2020 shows large amount of stool in his right lower quadrant, could have stool burden with liquefication diarrhea, will place on bowel regimen and hold Imodium on 12/30/2020 and monitor.    Orthostatic hypotension: Chronic issue-on midodrine/Florinef.  Per nephrology-on stretcher dialysis due to  inability to stand up/sit up.  Midodrine dose increased on 12/29/2020.  Reduced beta-blocker dose on 12/29/2020.  Hypokalemia: Due to Florinef and diarrhea-replete and recheck.  Thrombocytopenia:?  Etiology-could have been due to COVID-19-HIT antibody negative.  Platelet count slowly improving.  Follow closely.   PAF with RVR: Back in sinus rhythm-on metoprolol and amiodarone-no longer on anticoagulation due to recent history of GI bleeding-and now with thrombocytopenia.  Follow closely.  Chronic debility/deconditioning: At the SNF for the past few months-extremely weak-Per patient-he is minimally ambulatory for the past few months.  COVID-19 infection: Incidental finding-supportive care at this point.  Continue isolation for 10 days from 1/20-last day on 1/29   Fever: afebrile.  O2 requirements:  SpO2: 95 % O2 Flow Rate (L/min): 2 L/min FiO2 (%): 40 %     Recent Labs  Lab 12/24/20 0146 12/24/20 0731 12/25/20 0039 12/26/20 0146 12/27/20 0214 12/29/20 0446 12/29/20 0728 12/29/20 2114 12/30/20 0327  WBC  --    < > 4.4 5.8 6.8 10.1 10.2 9.6 9.2  HGB  --    < > 7.8* 7.6* 7.6* 6.5* 6.4* 8.4* 9.3*  HCT  --    < > 24.4* 24.3* 23.1* 22.3* 22.0* 27.0* 28.1*  PLT  --    < > 59* 67* 62* 64* 63* 70* 71*  CRP 18.1*  --  19.4* 14.2* 13.6*  --   --   --  15.8*  BNP  --   --   --   --   --   --   --   --  743.7*  AST 30  --  19 27 30   --   --   --  239*  ALT 9  --  11 13 14   --   --   --  175*  ALKPHOS 84  --  112 125 127*  --   --   --  167*  BILITOT 0.6  --  0.9 0.7 1.1  --   --   --  1.3*  ALBUMIN 1.7*  --  1.7* 1.5* 1.8* 1.6*  --   --  1.7*  LATICACIDVEN  --   --   --   --   --  2.9*  --   --   --    < > =  values in this interval not displayed.      Nutrition Problem: Nutrition Problem: Increased nutrient needs Etiology: wound healing,catabolic illness,chronic illness Signs/Symptoms: estimated needs Interventions: Ensure Enlive (each supplement provides 350kcal and 20 grams of  protein),Prostat   Sacral decubitus ulcer: On hydrotherapy per PT-evaluated by general surgery on 1/27-with recommendations to continue with hydrotherapy for few more days before consideration of discharge.  Addendum: Discussed with general surgery on 1/28-continue with hydrotherapy-General surgery will reassess on 1/31-and provide further recommendations regarding wound care.  Pressure Injury 12/05/20 Coccyx Medial;Upper Unstageable - Full thickness tissue loss in which the base of the injury is covered by slough (yellow, tan, gray, green or brown) and/or eschar (tan, brown or black) in the wound bed. prevous stage 2 on sacrum (Active)  12/05/20 1000  Location: Coccyx  Location Orientation: Medial;Upper  Staging: Unstageable - Full thickness tissue loss in which the base of the injury is covered by slough (yellow, tan, gray, green or brown) and/or eschar (tan, brown or black) in the wound bed.  Wound Description (Comments): prevous stage 2 on sacrum, now evolved to unstageable. Black eschar covering wound bed  Present on Admission: No       Condition - Stable  Family Communication  : Daughter-[name]-on 12/27/20, 12/30/20  Code Status :  Full Code  Diet :  Diet Order            DIET SOFT Room service appropriate? No; Fluid consistency: Thin  Diet effective now                  Disposition Plan  :   Status is: Inpatient  Remains inpatient appropriate because:Inpatient level of care appropriate due to severity of illness   Dispo: The patient is from: SNF              Anticipated d/c is to: SNF              Anticipated d/c date is: > 3 days              Patient currently is not medically stable to d/c.   Difficult to place patient No   Barriers to discharge: Needs inpatient hydrotherapy-for for sacral wound stabilization before consideration of discharge to SNF.  Antimicorbials  :    Anti-infectives (From admission, onward)   Start     Dose/Rate Route  Frequency Ordered Stop   12/22/2020 1500  piperacillin-tazobactam (ZOSYN) IVPB 3.375 g        3.375 g 12.5 mL/hr over 240 Minutes Intravenous Every 12 hours 12/13/2020 1219 12/24/20 2001   12/28/2020 1200  piperacillin-tazobactam (ZOSYN) IVPB 2.25 g  Status:  Discontinued        2.25 g 100 mL/hr over 30 Minutes Intravenous Every 8 hours 12/15/2020 0205 12/22/2020 1219   12/07/2020 0215  piperacillin-tazobactam (ZOSYN) IVPB 3.375 g        3.375 g 100 mL/hr over 30 Minutes Intravenous  Once 12/31/2020 0205 12/08/2020 0435      Inpatient Medications  Scheduled Meds: . (feeding supplement) PROSource Plus  30 mL Oral TID with meals  . amiodarone  200 mg Oral BID  . Chlorhexidine Gluconate Cloth  6 each Topical Q0600  . famotidine  20 mg Oral Daily  . feeding supplement  1 Container Oral TID BM  . feeding supplement  237 mL Oral BID BM  . fludrocortisone  0.2 mg Oral BID  . insulin aspart  0-15 Units Subcutaneous TID WC  . insulin aspart  0-5 Units Subcutaneous QHS  . metoprolol tartrate  12.5 mg Oral BID  . midodrine  10 mg Oral TID WC  . multivitamin  1 tablet Oral QHS  . mupirocin ointment   Nasal BID  . pantoprazole (PROTONIX) IV  40 mg Intravenous Q12H  . psyllium  1 packet Oral BID  . pyridostigmine  60 mg Oral Daily  . rosuvastatin  5 mg Oral QHS  . sodium bicarbonate  100 mEq Intravenous Once   Continuous Infusions:  PRN Meds:.docusate, fentaNYL (SUBLIMAZE) injection, guaiFENesin, loperamide, oxyCODONE, polyethylene glycol, sodium chloride flush   Time Spent in minutes  15  See all Orders from today for further details   Lala Lund M.D on 12/30/2020 at 9:30 AM  To page go to [email or web address] - use universal password  [facility] -  Office  [phone]    Objective:   Vitals:   12/29/20 2200 12/29/20 2356 12/30/20 0044 12/30/20 0725  BP:  (!) 146/73 132/71 137/74  Pulse: 73 67 67 82  Resp: 16 20 16 18   Temp: 98.4 F (36.9 C) 98.2 F (36.8 C) 98.1 F (36.7 C) 98  F (36.7 C)  TempSrc: Oral Oral Oral Oral  SpO2: 94% 95% 94% 95%  Weight:      Height:        Wt Readings from Last 3 Encounters:  12/27/20 68 kg  12/18/20 61.2 kg  12/10/20 66.3 kg     Intake/Output Summary (Last 24 hours) at 12/30/2020 0930 Last data filed at 12/30/2020 0313 Gross per 24 hour  Intake 1146.42 ml  Output 400 ml  Net 746.42 ml     Physical Exam  Awake Alert, No new F.N deficits, Normal affect [place].AT,PERRAL Supple Neck,No JVD, No cervical lymphadenopathy appriciated.  Symmetrical Chest wall movement, Good air movement bilaterally, CTAB RRR,No Gallops, Rubs or new Murmurs, No Parasternal Heave +ve B.Sounds, Abd Soft, No tenderness, No organomegaly appriciated, No rebound - guarding or rigidity. No Cyanosis, Clubbing or edema, No new Rash or bruise     Data Review:    CBC Recent Labs  Lab 12/27/20 0214 12/29/20 0446 12/29/20 0728 12/29/20 2114 12/30/20 0327  WBC 6.8 10.1 10.2 9.6 9.2  HGB 7.6* 6.5* 6.4* 8.4* 9.3*  HCT 23.1* 22.3* 22.0* 27.0* 28.1*  PLT 62* 64* 63* 70* 71*  MCV 91.7 99.6 100.9* 95.7 92.4  MCH 30.2 29.0 29.4 29.8 30.6  MCHC 32.9 29.1* 29.1* 31.1 33.1  RDW 20.0* 19.0* 19.3* 18.3* 18.3*  LYMPHSABS  --  0.7  --   --  0.8  MONOABS  --  0.5  --   --  0.5  EOSABS  --  0.0  --   --  0.1  BASOSABS  --  0.0  --   --  0.0    Chemistries  Recent Labs  Lab 12/24/20 0146 12/25/20 0039 12/26/20 0146 12/27/20 0214 12/29/20 0446 12/30/20 0327  NA 137 134* 135 137 132* 134*  K 3.3* 2.6* 3.9 3.7 4.4 4.3  CL 103 98 103 102 100 101  CO2 18* 20* 16* 21* 19* 19*  GLUCOSE 133* 155* 116* 153* 115* 122*  BUN 32* 47* 59* 36* 55* 66*  CREATININE 3.00* 3.91* 4.54* 3.43* 5.05* 5.42*  CALCIUM 6.6* 6.9* 7.0* 7.3* 7.5* 7.6*  MG  --   --  1.6*  --   --  2.1  AST 30 19 27 30   --  239*  ALT 9 11 13 14   --  175*  ALKPHOS  84 112 125 127*  --  167*  BILITOT 0.6 0.9 0.7 1.1  --  1.3*    ------------------------------------------------------------------------------------------------------------------ No results for input(s): CHOL, HDL, LDLCALC, TRIG, CHOLHDL, LDLDIRECT in the last 72 hours.  Lab Results  Component Value Date   HGBA1C 4.1 (L) 12/21/2020   ------------------------------------------------------------------------------------------------------------------ No results for input(s): TSH, T4TOTAL, T3FREE, THYROIDAB in the last 72 hours.  Invalid input(s): FREET3 ------------------------------------------------------------------------------------------------------------------ No results for input(s): VITAMINB12, FOLATE, FERRITIN, TIBC, IRON, RETICCTPCT in the last 72 hours.  Coagulation profile No results for input(s): INR, PROTIME in the last 168 hours.  No results for input(s): DDIMER in the last 72 hours.  Cardiac Enzymes No results for input(s): CKMB, TROPONINI, MYOGLOBIN in the last 168 hours.  Invalid input(s): CK ------------------------------------------------------------------------------------------------------------------    Component Value Date/Time   BNP 743.7 (H) 12/30/2020 0327    Micro Results Recent Results (from the past 240 hour(s))  MRSA PCR Screening     Status: Abnormal   Collection Time: 12/21/20 11:39 PM  Result Value Ref Range Status   MRSA by PCR POSITIVE (A) NEGATIVE Final    Comment:        The GeneXpert MRSA Assay (FDA approved for NASAL specimens only), is one component of a comprehensive MRSA colonization surveillance program. It is not intended to diagnose MRSA infection nor to guide or monitor treatment for MRSA infections. RESULT CALLED TO, READ BACK BY AND VERIFIED WITH: D TUCK RN 12/22/20 0134 JDW Performed at [facility], [address], [place],  [address]     Radiology Reports CT Abdomen Pelvis Wo Contrast  Result Date: 12/10/2020 CLINICAL DATA:  Abdominal pain EXAM: CT ABDOMEN AND  PELVIS WITHOUT CONTRAST TECHNIQUE: Multidetector CT imaging of the abdomen and pelvis was performed following the standard protocol without IV contrast. COMPARISON:  November 07, 2020 and October 22, 2020 FINDINGS: Lower chest: The visualized heart size within normal limits. No pericardial fluid/thickening. No hiatal hernia. Small bilateral pleural effusions are seen. Hepatobiliary: A small amount of pneumobilia is seen in the inferior right liver lobe and surrounding the posterior gallbladder. A distended fluid and contrast filled gallbladder is seen, likely due to vicarious contrast excretion. There is small layering gallstones. Pancreas:  Unremarkable.  No surrounding inflammatory changes. Spleen: Normal in size. Although limited due to the lack of intravenous contrast, normal in appearance. Adrenals/Urinary Tract: Both adrenal glands appear normal. Multiple bilateral renal calculi are again identified. The largest within the upper pole of the right kidney measuring 5 mm and the largest within the left kidney measuring 4 mm within the midpole. No hydronephrosis. The bladder is decompressed with a Foley catheter. Stomach/Bowel: Small amount of contrast reflux seen within the distal esophagus. There is mildly prominent contrast filled loops of jejunum seen within the left upper quadrant without a clear transition point the remainder of the small bowel is decompressed. There is been interval increased air and stool-filled dilation of the rectum and sigmoid colon to the level of the descending colon junction. At the level of the descending colon/sigmoid colon junction there is focal wall thickening and a small amount of free air seen anteriorly, consistent with a probable transition point and perforation. There is air seen surrounding the stool at the level of the sigmoid rectal junction which could be concerning for pneumatosis. Vascular/Lymphatic: Scattered aortic atherosclerosis is noted. Reproductive: The  prostate is unremarkable. Other: There is a moderate amount of free air seen under the hemidiaphragms and is seen surrounding the porta hepatis.  Musculoskeletal: Bilateral sacral decubitus ulceration seen over the inferior coccyx with debris and foci of air. There is non loculated fluid in this area. Degenerative changes seen throughout the lumbar spine most notable of L2-L3. IMPRESSION: 1. There is a focal area of wall thickening with adjacent perforation of the distal descending colon at the junction of the sigmoid colon, likely the transition point. 2. There is moderately dilated sigmoid colon and rectum which is stool-filled and suggestion of possible pneumatosis. 3. Free air seen under the right hemidiaphragm and within the porta hepatis. 4. Small amount of air seen within the inferior right liver lobe which is concerning for portal venous gas given the patient's findings 5. Bilateral sacral decubitus ulceration with phlegmon and subcutaneous emphysema extending to the inferior coccyx. 6. These results were called by telephone at the time of interpretation on 12/01/2020 at 2:01 am to provider PA Sammy , who verbally acknowledged these results. Electronically Signed   By: Prudencio Pair M.D.   On: 12/26/2020 02:06   NM GI Blood Loss  Result Date: 12/29/2020 CLINICAL DATA:  GI bleeding EXAM: NUCLEAR MEDICINE GASTROINTESTINAL BLEEDING SCAN TECHNIQUE: Sequential abdominal images were obtained following intravenous administration of Tc-80m labeled red blood cells. RADIOPHARMACEUTICALS:  mCi Tc-67m pertechnetate in-vitro labeled red cells. COMPARISON:  None. FINDINGS: Initial images and demonstrates activity in the cardiac blood pool is well as the liver and spleen. Some background activity is noted throughout the bowel which shows no significant interval change to suggest active GI hemorrhage. Imaging into the second hour shows no focal area of increased activity to suggest active significant GI hemorrhage.  IMPRESSION: No definitive GI hemorrhage is identified. Electronically Signed   By: Inez Catalina M.D.   On: 12/29/2020 19:11   DG CHEST PORT 1 VIEW  Result Date: 12/18/2020 CLINICAL DATA:  Intubation EXAM: PORTABLE CHEST 1 VIEW COMPARISON:  Ten days ago FINDINGS: Endotracheal tube with tip at the clavicular heads. The enteric tube reaches the stomach with side port near the GE junction. Dialysis catheter with tips at the upper right atrium. Large volume pneumoperitoneum, known prior CT. Hazy density at the lung bases from pleural fluid primarily based on CT. IMPRESSION: 1. Enteric tube side-port is at the GE junction. Otherwise unremarkable hardware. 2. Hazy density at the bases from small pleural effusions by prior CT. 3. Pneumoperitoneum as seen on prior CT. Electronically Signed   By: Monte Fantasia M.D.   On: 12/27/2020 07:59   DG Chest Port 1 View  Result Date: 12/10/2020 CLINICAL DATA:  Status post exchange of a dialysis catheter. EXAM: PORTABLE CHEST 1 VIEW COMPARISON:  Single-view of the chest 12/07/2020. FINDINGS: New right IJ approach dialysis catheter is in place with its tip at the superior cavoatrial junction. No pneumothorax. Lungs are clear. Heart size is normal. No acute or focal bony abnormality. IMPRESSION: Dialysis catheter tip projects at the superior cavoatrial junction. Negative for pneumothorax or acute disease. Electronically Signed   By: Inge Rise M.D.   On: 12/10/2020 12:09   DG Chest Port 1 View  Result Date: 12/07/2020 CLINICAL DATA:  Post RIGHT-side dialysis catheter exchange EXAM: PORTABLE CHEST 1 VIEW COMPARISON:  Portable exam 1240 hours compared to 1003 hours FINDINGS: RIGHT jugular dual-lumen central venous catheter with tip projecting over SVC. Normal heart size, mediastinal contours, and pulmonary vascularity. Atherosclerotic calcification aorta. Minimal chronic peribronchial thickening without pulmonary infiltrate, pleural effusion, or pneumothorax. Bones mildly  demineralized. IMPRESSION: No pneumothorax following RIGHT jugular line placement. Minimal persistent bronchitic changes.  Aortic Atherosclerosis (ICD10-I70.0). Electronically Signed   By: Lavonia Dana M.D.   On: 12/07/2020 12:55   DG Chest Port 1 View  Result Date: 12/06/2020 CLINICAL DATA:  End-stage renal disease. EXAM: PORTABLE CHEST 1 VIEW COMPARISON:  October 29, 2020. FINDINGS: The heart size and mediastinal contours are within normal limits. Both lungs are clear. Right internal jugular dialysis catheter is unchanged in position with distal tip in expected position of cavoatrial junction. The visualized skeletal structures are unremarkable. IMPRESSION: No active disease. Electronically Signed   By: Marijo Conception M.D.   On: 12/06/2020 10:34   DG Abd 2 Views  Result Date: 12/29/2020 CLINICAL DATA:  Free air EXAM: ABDOMEN - 2 VIEW COMPARISON:  None. FINDINGS: Massive pneumoperitoneum persists. There is a large amount of stool visible in the right lower quadrant. No dilated small bowel. IMPRESSION: 1. Persistent massive pneumoperitoneum. No visible small bowel dilatation. 2. Large amount of stool in the right lower quadrant. Electronically Signed   By: Ulyses Jarred M.D.   On: 12/29/2020 00:59   DG C-Arm 1-60 Min-No Report  Result Date: 12/10/2020 Fluoroscopy was utilized by the requesting physician.  No radiographic interpretation.   DG C-Arm 1-60 Min-No Report  Result Date: 12/07/2020 Fluoroscopy was utilized by the requesting physician.  No radiographic interpretation.

## 2020-12-30 NOTE — Plan of Care (Signed)
Pt is A/Ox4; VSS; on room air and sats above 95%; pt c/o of back pain at the area of a stage 4 pressure ulcer on his lower back; pain med provided; see mar; Q shift wound care completed per orders; Q2 turns performed; CHG bath completed; Pt had 1 unit of blood during this shift and tolerated well; no adverse reactions; Pt has a mild cough; Md notified, see mar. Pt needs to be encouraged to eat and drink and will drink boost and ensure nutritional supplements. Pt denies any other complaints at this time; Per pt all needs have been met; Bed is locked in lowest position; call bell is within reach; 3 side rails up; Continue to monitor and follow plan of care.      Problem: Education: Goal: Knowledge of General Education information will improve Description: Including pain rating scale, medication(s)/side effects and non-pharmacologic comfort measures Outcome: Progressing   Problem: Health Behavior/Discharge Planning: Goal: Ability to manage health-related needs will improve Outcome: Progressing   Problem: Clinical Measurements: Goal: Ability to maintain clinical measurements within normal limits will improve Outcome: Progressing Goal: Will remain free from infection Outcome: Progressing Goal: Diagnostic test results will improve Outcome: Progressing Goal: Respiratory complications will improve Outcome: Progressing Goal: Cardiovascular complication will be avoided Outcome: Progressing   Problem: Activity: Goal: Risk for activity intolerance will decrease Outcome: Progressing   Problem: Nutrition: Goal: Adequate nutrition will be maintained Outcome: Progressing   Problem: Elimination: Goal: Will not experience complications related to bowel motility Outcome: Progressing Goal: Will not experience complications related to urinary retention Outcome: Progressing   Problem: Pain Managment: Goal: General experience of comfort will improve Outcome: Progressing   Problem: Safety: Goal:  Ability to remain free from injury will improve Outcome: Progressing   Problem: Skin Integrity: Goal: Risk for impaired skin integrity will decrease Outcome: Progressing   Problem: Education: Goal: Knowledge of risk factors and measures for prevention of condition will improve Outcome: Progressing   Problem: Coping: Goal: Psychosocial and spiritual needs will be supported Outcome: Progressing   Problem: Respiratory: Goal: Will maintain a patent airway Outcome: Progressing Goal: Complications related to the disease process, condition or treatment will be avoided or minimized Outcome: Progressing   Problem: Education: Goal: Required Educational Video(s) Outcome: Progressing   Problem: Clinical Measurements: Goal: Ability to maintain clinical measurements within normal limits will improve Outcome: Progressing Goal: Postoperative complications will be avoided or minimized Outcome: Progressing   Problem: Skin Integrity: Goal: Demonstration of wound healing without infection will improve Outcome: Progressing   Problem: Education: Goal: Ability to describe self-care measures that may prevent or decrease complications (Diabetes Survival Skills Education) will improve Outcome: Progressing Goal: Individualized Educational Video(s) Outcome: Progressing   Problem: Coping: Goal: Ability to adjust to condition or change in health will improve Outcome: Progressing   Problem: Fluid Volume: Goal: Ability to maintain a balanced intake and output will improve Outcome: Progressing   Problem: Health Behavior/Discharge Planning: Goal: Ability to identify and utilize available resources and services will improve Outcome: Progressing Goal: Ability to manage health-related needs will improve Outcome: Progressing   Problem: Metabolic: Goal: Ability to maintain appropriate glucose levels will improve Outcome: Progressing   Problem: Nutritional: Goal: Maintenance of adequate nutrition  will improve Outcome: Progressing Goal: Progress toward achieving an optimal weight will improve Outcome: Progressing   Problem: Skin Integrity: Goal: Risk for impaired skin integrity will decrease Outcome: Progressing   Problem: Tissue Perfusion: Goal: Adequacy of tissue perfusion will improve Outcome: Progressing
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
## 2020-12-31 ENCOUNTER — Inpatient Hospital Stay (HOSPITAL_COMMUNITY): Payer: Medicare Other | Admitting: Anesthesiology

## 2020-12-31 ENCOUNTER — Inpatient Hospital Stay (HOSPITAL_COMMUNITY): Payer: Medicare Other

## 2020-12-31 ENCOUNTER — Encounter (HOSPITAL_COMMUNITY): Admission: EM | Disposition: E | Payer: Self-pay | Source: Skilled Nursing Facility | Attending: Internal Medicine

## 2020-12-31 ENCOUNTER — Encounter (HOSPITAL_COMMUNITY): Payer: Self-pay | Admitting: Internal Medicine

## 2020-12-31 DIAGNOSIS — K631 Perforation of intestine (nontraumatic): Secondary | ICD-10-CM | POA: Diagnosis not present

## 2020-12-31 DIAGNOSIS — J9601 Acute respiratory failure with hypoxia: Secondary | ICD-10-CM | POA: Diagnosis not present

## 2020-12-31 HISTORY — PX: COLOSTOMY: SHX63

## 2020-12-31 HISTORY — PX: LAPAROTOMY: SHX154

## 2020-12-31 HISTORY — PX: COLECTOMY: SHX59

## 2020-12-31 LAB — CBC WITH DIFFERENTIAL/PLATELET
Abs Immature Granulocytes: 0.07 10*3/uL (ref 0.00–0.07)
Abs Immature Granulocytes: 0.05 10*3/uL (ref 0.00–0.07)
Basophils Absolute: 0 10*3/uL (ref 0.0–0.1)
Basophils Absolute: 0 10*3/uL (ref 0.0–0.1)
Basophils Relative: 0 %
Basophils Relative: 0 %
Eosinophils Absolute: 0 10*3/uL (ref 0.0–0.5)
Eosinophils Absolute: 0 10*3/uL (ref 0.0–0.5)
Eosinophils Relative: 0 %
Eosinophils Relative: 0 %
HCT: 28.9 % — ABNORMAL LOW (ref 39.0–52.0)
HCT: 25.9 % — ABNORMAL LOW (ref 39.0–52.0)
Hemoglobin: 9.4 g/dL — ABNORMAL LOW (ref 13.0–17.0)
Hemoglobin: 8.5 g/dL — ABNORMAL LOW (ref 13.0–17.0)
Immature Granulocytes: 1 %
Immature Granulocytes: 1 %
Lymphocytes Relative: 6 %
Lymphocytes Relative: 4 %
Lymphs Abs: 0.6 10*3/uL — ABNORMAL LOW (ref 0.7–4.0)
Lymphs Abs: 0.4 10*3/uL — ABNORMAL LOW (ref 0.7–4.0)
MCH: 30.3 pg (ref 26.0–34.0)
MCH: 30.4 pg (ref 26.0–34.0)
MCHC: 32.5 g/dL (ref 30.0–36.0)
MCHC: 32.8 g/dL (ref 30.0–36.0)
MCV: 93.2 fL (ref 80.0–100.0)
MCV: 92.5 fL (ref 80.0–100.0)
Monocytes Absolute: 0.4 10*3/uL (ref 0.1–1.0)
Monocytes Absolute: 0.3 10*3/uL (ref 0.1–1.0)
Monocytes Relative: 4 %
Monocytes Relative: 3 %
Neutro Abs: 8.1 10*3/uL — ABNORMAL HIGH (ref 1.7–7.7)
Neutro Abs: 7.7 10*3/uL (ref 1.7–7.7)
Neutrophils Relative %: 89 %
Neutrophils Relative %: 92 %
Platelets: 65 10*3/uL — ABNORMAL LOW (ref 150–400)
Platelets: 51 10*3/uL — ABNORMAL LOW (ref 150–400)
RBC: 3.1 MIL/uL — ABNORMAL LOW (ref 4.22–5.81)
RBC: 2.8 MIL/uL — ABNORMAL LOW (ref 4.22–5.81)
RDW: 18.6 % — ABNORMAL HIGH (ref 11.5–15.5)
RDW: 18.5 % — ABNORMAL HIGH (ref 11.5–15.5)
WBC: 9.2 10*3/uL (ref 4.0–10.5)
WBC: 8.4 10*3/uL (ref 4.0–10.5)
nRBC: 0 % (ref 0.0–0.2)
nRBC: 0 % (ref 0.0–0.2)

## 2020-12-31 LAB — GLUCOSE, CAPILLARY
Glucose-Capillary: 112 mg/dL — ABNORMAL HIGH (ref 70–99)
Glucose-Capillary: 143 mg/dL — ABNORMAL HIGH (ref 70–99)
Glucose-Capillary: 146 mg/dL — ABNORMAL HIGH (ref 70–99)
Glucose-Capillary: 156 mg/dL — ABNORMAL HIGH (ref 70–99)
Glucose-Capillary: 162 mg/dL — ABNORMAL HIGH (ref 70–99)

## 2020-12-31 LAB — POCT I-STAT 7, (LYTES, BLD GAS, ICA,H+H)
Acid-base deficit: 5 mmol/L — ABNORMAL HIGH (ref 0.0–2.0)
Bicarbonate: 18.8 mmol/L — ABNORMAL LOW (ref 20.0–28.0)
Calcium, Ion: 1 mmol/L — ABNORMAL LOW (ref 1.15–1.40)
HCT: 26 % — ABNORMAL LOW (ref 39.0–52.0)
Hemoglobin: 8.8 g/dL — ABNORMAL LOW (ref 13.0–17.0)
O2 Saturation: 100 %
Patient temperature: 95.6
Potassium: 4.2 mmol/L (ref 3.5–5.1)
Sodium: 132 mmol/L — ABNORMAL LOW (ref 135–145)
TCO2: 20 mmol/L — ABNORMAL LOW (ref 22–32)
pCO2 arterial: 28.5 mmHg — ABNORMAL LOW (ref 32.0–48.0)
pH, Arterial: 7.42 (ref 7.350–7.450)
pO2, Arterial: 476 mmHg — ABNORMAL HIGH (ref 83.0–108.0)

## 2020-12-31 LAB — PROTIME-INR
INR: 1.1 (ref 0.8–1.2)
Prothrombin Time: 13.7 seconds (ref 11.4–15.2)

## 2020-12-31 LAB — COMPREHENSIVE METABOLIC PANEL
ALT: 161 U/L — ABNORMAL HIGH (ref 0–44)
AST: 200 U/L — ABNORMAL HIGH (ref 15–41)
Albumin: 1.6 g/dL — ABNORMAL LOW (ref 3.5–5.0)
Alkaline Phosphatase: 167 U/L — ABNORMAL HIGH (ref 38–126)
Anion gap: 18 — ABNORMAL HIGH (ref 5–15)
BUN: 79 mg/dL — ABNORMAL HIGH (ref 8–23)
CO2: 16 mmol/L — ABNORMAL LOW (ref 22–32)
Calcium: 7.6 mg/dL — ABNORMAL LOW (ref 8.9–10.3)
Chloride: 100 mmol/L (ref 98–111)
Creatinine, Ser: 6.18 mg/dL — ABNORMAL HIGH (ref 0.61–1.24)
GFR, Estimated: 9 mL/min — ABNORMAL LOW (ref >60)
Glucose, Bld: 122 mg/dL — ABNORMAL HIGH (ref 70–99)
Potassium: 4.3 mmol/L (ref 3.5–5.1)
Sodium: 134 mmol/L — ABNORMAL LOW (ref 135–145)
Total Bilirubin: 0.8 mg/dL (ref 0.3–1.2)
Total Protein: 4.7 g/dL — ABNORMAL LOW (ref 6.5–8.1)

## 2020-12-31 LAB — BASIC METABOLIC PANEL
Anion gap: 16 — ABNORMAL HIGH (ref 5–15)
BUN: 85 mg/dL — ABNORMAL HIGH (ref 8–23)
CO2: 16 mmol/L — ABNORMAL LOW (ref 22–32)
Calcium: 7.5 mg/dL — ABNORMAL LOW (ref 8.9–10.3)
Chloride: 100 mmol/L (ref 98–111)
Creatinine, Ser: 6.14 mg/dL — ABNORMAL HIGH (ref 0.61–1.24)
GFR, Estimated: 9 mL/min — ABNORMAL LOW (ref >60)
Glucose, Bld: 167 mg/dL — ABNORMAL HIGH (ref 70–99)
Potassium: 4.2 mmol/L (ref 3.5–5.1)
Sodium: 132 mmol/L — ABNORMAL LOW (ref 135–145)

## 2020-12-31 LAB — BRAIN NATRIURETIC PEPTIDE: B Natriuretic Peptide: 534 pg/mL — ABNORMAL HIGH (ref 0.0–100.0)

## 2020-12-31 LAB — DIC (DISSEMINATED INTRAVASCULAR COAGULATION)PANEL
D-Dimer, Quant: 6.17 ug/mL-FEU — ABNORMAL HIGH (ref 0.00–0.50)
Fibrinogen: 264 mg/dL (ref 210–475)
INR: 1.2 (ref 0.8–1.2)
Platelets: 55 10*3/uL — ABNORMAL LOW (ref 150–400)
Prothrombin Time: 14.7 seconds (ref 11.4–15.2)
Smear Review: NONE SEEN
aPTT: 40 seconds — ABNORMAL HIGH (ref 24–36)

## 2020-12-31 LAB — C-REACTIVE PROTEIN: CRP: 17 mg/dL — ABNORMAL HIGH (ref <1.0)

## 2020-12-31 LAB — LACTIC ACID, PLASMA: Lactic Acid, Venous: 1.5 mmol/L (ref 0.5–1.9)

## 2020-12-31 LAB — MAGNESIUM: Magnesium: 2 mg/dL (ref 1.7–2.4)

## 2020-12-31 SURGERY — LAPAROTOMY, EXPLORATORY
Anesthesia: General | Site: Abdomen

## 2020-12-31 MED ORDER — PROPOFOL 10 MG/ML IV BOLUS
INTRAVENOUS | Status: DC | PRN
  Administered 2020-12-31: 140 mg via INTRAVENOUS

## 2020-12-31 MED ORDER — FENTANYL CITRATE (PF) 250 MCG/5ML IJ SOLN
INTRAMUSCULAR | Status: AC
  Filled 2020-12-31: qty 5

## 2020-12-31 MED ORDER — PIPERACILLIN-TAZOBACTAM 3.375 G IVPB
3.3750 g | INTRAVENOUS | Status: DC
  Filled 2020-12-31: qty 50

## 2020-12-31 MED ORDER — HYDROCORTISONE NA SUCCINATE PF 100 MG IJ SOLR
50.0000 mg | Freq: Four times a day (QID) | INTRAMUSCULAR | Status: DC
  Administered 2020-12-31 – 2021-01-04 (×16): 50 mg via INTRAVENOUS
  Filled 2020-12-31 (×16): qty 2

## 2020-12-31 MED ORDER — PROPOFOL 1000 MG/100ML IV EMUL
5.0000 ug/kg/min | INTRAVENOUS | Status: DC
  Administered 2021-01-01: 10 ug/kg/min via INTRAVENOUS
  Filled 2020-12-31 (×2): qty 100

## 2020-12-31 MED ORDER — PHENYLEPHRINE 40 MCG/ML (10ML) SYRINGE FOR IV PUSH (FOR BLOOD PRESSURE SUPPORT)
PREFILLED_SYRINGE | INTRAVENOUS | Status: DC | PRN
  Administered 2020-12-31: 120 ug via INTRAVENOUS
  Administered 2020-12-31: 80 ug via INTRAVENOUS

## 2020-12-31 MED ORDER — SODIUM CHLORIDE 0.9 % IV SOLN: INTRAVENOUS | Status: DC | PRN

## 2020-12-31 MED ORDER — PIPERACILLIN-TAZOBACTAM IN DEX 2-0.25 GM/50ML IV SOLN
2.2500 g | Freq: Three times a day (TID) | INTRAVENOUS | Status: DC
  Administered 2020-12-31 – 2021-01-03 (×9): 2.25 g via INTRAVENOUS
  Filled 2020-12-31 (×13): qty 50

## 2020-12-31 MED ORDER — 0.9 % SODIUM CHLORIDE (POUR BTL) OPTIME
TOPICAL | Status: DC | PRN
  Administered 2020-12-31 (×2): 1000 mL

## 2020-12-31 MED ORDER — PROPOFOL 1000 MG/100ML IV EMUL
INTRAVENOUS | Status: AC
  Filled 2020-12-31: qty 100

## 2020-12-31 MED ORDER — NOREPINEPHRINE 4 MG/250ML-% IV SOLN
INTRAVENOUS | Status: DC | PRN
  Administered 2020-12-31: 4 ug/min via INTRAVENOUS

## 2020-12-31 MED ORDER — DEXAMETHASONE SODIUM PHOSPHATE 10 MG/ML IJ SOLN
INTRAMUSCULAR | Status: DC | PRN
  Administered 2020-12-31: 5 mg via INTRAVENOUS

## 2020-12-31 MED ORDER — FENTANYL CITRATE (PF) 250 MCG/5ML IJ SOLN
INTRAMUSCULAR | Status: DC | PRN
  Administered 2020-12-31 (×5): 50 ug via INTRAVENOUS

## 2020-12-31 MED ORDER — LIDOCAINE 2% (20 MG/ML) 5 ML SYRINGE
INTRAMUSCULAR | Status: DC | PRN
  Administered 2020-12-31: 6 mg via INTRAVENOUS

## 2020-12-31 MED ORDER — ONDANSETRON HCL 4 MG/2ML IJ SOLN
INTRAMUSCULAR | Status: DC | PRN
  Administered 2020-12-31: 4 mg via INTRAVENOUS

## 2020-12-31 MED ORDER — INSULIN ASPART 100 UNIT/ML ~~LOC~~ SOLN
0.0000 [IU] | SUBCUTANEOUS | Status: DC
  Administered 2020-12-31 – 2021-01-02 (×5): 1 [IU] via SUBCUTANEOUS

## 2020-12-31 MED ORDER — SODIUM BICARBONATE 8.4 % IV SOLN
INTRAVENOUS | Status: AC
  Filled 2020-12-31: qty 50

## 2020-12-31 MED ORDER — ROCURONIUM BROMIDE 10 MG/ML (PF) SYRINGE
PREFILLED_SYRINGE | INTRAVENOUS | Status: DC | PRN
  Administered 2020-12-31 (×2): 50 mg via INTRAVENOUS

## 2020-12-31 MED ORDER — MIDAZOLAM HCL 2 MG/2ML IJ SOLN
INTRAMUSCULAR | Status: DC | PRN
  Administered 2020-12-31: 2 mg via INTRAVENOUS

## 2020-12-31 MED ORDER — MIDAZOLAM HCL 2 MG/2ML IJ SOLN
INTRAMUSCULAR | Status: AC
  Filled 2020-12-31: qty 2

## 2020-12-31 MED ORDER — ORAL CARE MOUTH RINSE
15.0000 mL | OROMUCOSAL | Status: DC
  Administered 2020-12-31 – 2021-01-04 (×39): 15 mL via OROMUCOSAL

## 2020-12-31 MED ORDER — CHLORHEXIDINE GLUCONATE 0.12% ORAL RINSE (MEDLINE KIT)
15.0000 mL | Freq: Two times a day (BID) | OROMUCOSAL | Status: DC
  Administered 2020-12-31 – 2021-01-04 (×8): 15 mL via OROMUCOSAL

## 2020-12-31 MED ORDER — SUCCINYLCHOLINE CHLORIDE 200 MG/10ML IV SOSY
PREFILLED_SYRINGE | INTRAVENOUS | Status: DC | PRN
  Administered 2020-12-31: 100 mg via INTRAVENOUS

## 2020-12-31 MED ORDER — THIAMINE HCL 100 MG/ML IJ SOLN
Freq: Once | INTRAVENOUS | Status: AC
  Filled 2020-12-31: qty 1000

## 2020-12-31 MED ORDER — DEXMEDETOMIDINE HCL IN NACL 400 MCG/100ML IV SOLN: 0.0000 ug/kg/h | INTRAVENOUS | Status: DC

## 2020-12-31 MED ORDER — FENTANYL CITRATE (PF) 100 MCG/2ML IJ SOLN
25.0000 ug | INTRAMUSCULAR | Status: DC | PRN
Start: 2020-12-31 — End: 2021-01-01

## 2020-12-31 MED ORDER — NOREPINEPHRINE 4 MG/250ML-% IV SOLN
0.0000 ug/min | INTRAVENOUS | Status: DC
  Administered 2020-12-31: 10 ug/min via INTRAVENOUS
  Administered 2021-01-01: 4 ug/min via INTRAVENOUS
  Administered 2021-01-01: 3 ug/min via INTRAVENOUS
  Administered 2021-01-02: 10 ug/min via INTRAVENOUS
  Filled 2020-12-31 (×4): qty 250

## 2020-12-31 MED ORDER — PHENYLEPHRINE HCL-NACL 10-0.9 MG/250ML-% IV SOLN
INTRAVENOUS | Status: DC | PRN
  Administered 2020-12-31: 25 ug/min via INTRAVENOUS

## 2020-12-31 MED ORDER — HEPARIN SODIUM (PORCINE) 1000 UNIT/ML IJ SOLN
4000.0000 [IU] | Freq: Once | INTRAMUSCULAR | Status: AC
  Administered 2020-12-31: 1900 [IU] via INTRAVENOUS

## 2020-12-31 MED ORDER — NOREPINEPHRINE 4 MG/250ML-% IV SOLN
0.0000 ug/min | INTRAVENOUS | Status: DC
  Filled 2020-12-31: qty 250

## 2020-12-31 MED ORDER — ALBUMIN HUMAN 5 % IV SOLN: INTRAVENOUS | Status: DC | PRN

## 2020-12-31 MED ORDER — SODIUM BICARBONATE 8.4 % IV SOLN
INTRAVENOUS | Status: DC | PRN
  Administered 2020-12-31: 50 mL via INTRAVENOUS

## 2020-12-31 MED ORDER — FENTANYL CITRATE (PF) 100 MCG/2ML IJ SOLN: 25.0000 ug | INTRAMUSCULAR | Status: DC | PRN

## 2020-12-31 SURGICAL SUPPLY — 69 items
BIOPATCH RED 1 DISK 7.0 (GAUZE/BANDAGES/DRESSINGS) ×2 IMPLANT
BLADE CLIPPER SURG (BLADE) IMPLANT
BNDG GAUZE ELAST 4 BULKY (GAUZE/BANDAGES/DRESSINGS) ×4 IMPLANT
CANISTER SUCT 3000ML PPV (MISCELLANEOUS) ×2 IMPLANT
CHLORAPREP W/TINT 26 (MISCELLANEOUS) ×2 IMPLANT
COVER MAYO STAND STRL (DRAPES) ×4 IMPLANT
COVER SURGICAL LIGHT HANDLE (MISCELLANEOUS) ×2 IMPLANT
COVER WAND RF STERILE (DRAPES) ×2 IMPLANT
DRAIN CHANNEL 19F RND (DRAIN) ×2 IMPLANT
DRAPE HALF SHEET 40X57 (DRAPES) IMPLANT
DRAPE LAPAROSCOPIC ABDOMINAL (DRAPES) ×2 IMPLANT
DRAPE UTILITY XL STRL (DRAPES) ×2 IMPLANT
DRAPE WARM FLUID 44X44 (DRAPES) ×2 IMPLANT
DRSG OPSITE POSTOP 4X10 (GAUZE/BANDAGES/DRESSINGS) IMPLANT
DRSG OPSITE POSTOP 4X8 (GAUZE/BANDAGES/DRESSINGS) IMPLANT
DRSG TEGADERM 2-3/8X2-3/4 SM (GAUZE/BANDAGES/DRESSINGS) ×2 IMPLANT
ELECT BLADE 6.5 EXT (BLADE) ×2 IMPLANT
ELECT CAUTERY BLADE 6.4 (BLADE) ×2 IMPLANT
ELECT REM PT RETURN 9FT ADLT (ELECTROSURGICAL) ×2
ELECTRODE REM PT RTRN 9FT ADLT (ELECTROSURGICAL) ×1 IMPLANT
EVACUATOR SILICONE 100CC (DRAIN) ×2 IMPLANT
GAUZE SPONGE 4X4 12PLY STRL (GAUZE/BANDAGES/DRESSINGS) ×2 IMPLANT
GLOVE BIO SURGEON STRL SZ7 (GLOVE) ×6 IMPLANT
GLOVE BIOGEL PI IND STRL 7.5 (GLOVE) ×2 IMPLANT
GLOVE BIOGEL PI INDICATOR 7.5 (GLOVE) ×2
GOWN STRL REUS W/ TWL LRG LVL3 (GOWN DISPOSABLE) ×6 IMPLANT
GOWN STRL REUS W/TWL LRG LVL3 (GOWN DISPOSABLE) ×12
HANDLE SUCTION POOLE (INSTRUMENTS) ×1 IMPLANT
KIT BASIN OR (CUSTOM PROCEDURE TRAY) ×2 IMPLANT
KIT OSTOMY DRAINABLE 2.75 STR (WOUND CARE) ×2 IMPLANT
KIT SIGMOIDOSCOPE (SET/KITS/TRAYS/PACK) IMPLANT
KIT TURNOVER KIT B (KITS) ×2 IMPLANT
LEGGING LITHOTOMY PAIR STRL (DRAPES) IMPLANT
LIGASURE IMPACT 36 18CM CVD LR (INSTRUMENTS) ×2 IMPLANT
NS IRRIG 1000ML POUR BTL (IV SOLUTION) ×4 IMPLANT
PACK GENERAL/GYN (CUSTOM PROCEDURE TRAY) ×2 IMPLANT
PAD ABD 8X10 STRL (GAUZE/BANDAGES/DRESSINGS) ×2 IMPLANT
PAD ARMBOARD 7.5X6 YLW CONV (MISCELLANEOUS) ×4 IMPLANT
PENCIL SMOKE EVACUATOR (MISCELLANEOUS) ×2 IMPLANT
SPECIMEN JAR LARGE (MISCELLANEOUS) IMPLANT
SPONGE LAP 18X18 RF (DISPOSABLE) ×2 IMPLANT
STAPLER CUT CVD 40MM GREEN (STAPLE) ×2 IMPLANT
STAPLER PROXIMATE 75MM BLUE (STAPLE) ×2 IMPLANT
STAPLER VISISTAT 35W (STAPLE) ×2 IMPLANT
SUCTION POOLE HANDLE (INSTRUMENTS) ×2
SURGILUBE 2OZ TUBE FLIPTOP (MISCELLANEOUS) IMPLANT
SUT ETHILON 2 0 FS 18 (SUTURE) ×2 IMPLANT
SUT NOVA NAB DX-16 0-1 5-0 T12 (SUTURE) ×4 IMPLANT
SUT PDS AB 1 TP1 96 (SUTURE) ×6 IMPLANT
SUT PROLENE 1 CT (SUTURE) ×2 IMPLANT
SUT PROLENE 2 0 CT2 30 (SUTURE) IMPLANT
SUT PROLENE 2 0 KS (SUTURE) IMPLANT
SUT SILK 2 0 (SUTURE) ×2
SUT SILK 2 0 SH CR/8 (SUTURE) ×2 IMPLANT
SUT SILK 2 0 TIES 10X30 (SUTURE) ×2 IMPLANT
SUT SILK 2-0 18XBRD TIE 12 (SUTURE) ×1 IMPLANT
SUT SILK 3 0 (SUTURE) ×2
SUT SILK 3 0 SH CR/8 (SUTURE) ×2 IMPLANT
SUT SILK 3 0 TIES 10X30 (SUTURE) ×2 IMPLANT
SUT SILK 3-0 18XBRD TIE 12 (SUTURE) ×1 IMPLANT
SUT VIC AB 3-0 SH 18 (SUTURE) ×4 IMPLANT
SUT VIC AB 3-0 SH 27 (SUTURE)
SUT VIC AB 3-0 SH 27X BRD (SUTURE) IMPLANT
SYR BULB IRRIG 60ML STRL (SYRINGE) ×2 IMPLANT
TOWEL GREEN STERILE (TOWEL DISPOSABLE) ×4 IMPLANT
TRAY FOLEY MTR SLVR 14FR STAT (SET/KITS/TRAYS/PACK) ×2 IMPLANT
TUBE CONNECTING 12X1/4 (SUCTIONS) ×2 IMPLANT
UNDERPAD 30X36 HEAVY ABSORB (UNDERPADS AND DIAPERS) IMPLANT
YANKAUER SUCT BULB TIP NO VENT (SUCTIONS) IMPLANT

## 2020-12-31 NOTE — Progress Notes (Addendum)
PROGRESS NOTE                                                                                                                                                                                                             Patient Demographics:    [name], is a 70 y.o. male, DOB - 03/18/1951, VEH:[id number]  Outpatient Primary MD for the patient is Patient, No Pcp Per   Admit date - 12/03/2020   LOS - 81  Chief Complaint  Patient presents with  . Vomiting       Brief Narrative:  Patient is a 70 y.o. male with PMHx of ESRD, chronic diarrhea (with extensive work-up in the past), orthostatic hypotension, PAF-anticoagulation on hold due to history of GI bleeding, -presented to the hospital from SNF due to abdominal pain, nausea and vomiting-found to have pneumoperitoneum on imaging studies-underwent exploratory laparotomy without any findings of bowel perforation.  He was also found to be COVID-positive.  He was managed in the [place] stability-transferred to the [facility].  He was doing fairly well however on 12/30/2020 his LFTs jumped, on 12/11/2020 he had still high LFTs and his KUB showed worsening intraperitoneal air, we also reviewed his old colonoscopy reports which showed multiple ulcers in the sigmoid colon of nonspecific etiology per biopsy report, case was discussed with Dr. Donne Hazel general surgery who thought that patient probably will benefit from removal of left of his colon.  He was taken to the OR on 12/13/2020 for hemicolectomy and colostomy.  Subsequently will be transferred to ICU for close monitoring, have updated daughter in detail on 12/22/2020 postop.  Have told her that long-term prognosis is very poor due to multiple underlying comorbidities which include ESRD, bedbound status for several months, large sacral decubitus ulcer and now bowel ulceration with perforation.  She understands  it.    Significant Events:  1/19>> Admit to [facility] for abdominal pain-pneumoperitoneum on imaging studies-exploratory laparotomy negative 1/22>> transferred to [facility] service. 1/26>> plans to discharge back to SNF-however upon further evaluation-needed inpatient hydrotherapy for worsening sacral wound. 1/27>> general surgery consulted for sacral wound-continue hydrotherapy for few more days.  Significant studies:  1/20>> CT abdomen/pelvis: Focal area of wall thickening within the adjacent perforation of the distal descending colon, free air under the right diaphragm 1/20>> chest x-ray: Hazy density at the bases from  small pleural effusions by prior CT.   COVID-19 medications:  None  Antibiotics:  Zosyn: 1/19>>1/24  Microbiology data: None  Procedures:  Exploratory laparotomy 1/20 ETT 1/20>>1/20  Consults: CCS, PCCM, nephrology, wound care  DVT prophylaxis: SCDs Start: 12/19/2020 0649    Subjective:   Patient in bed, appears comfortable, denies any headache, no fever, no chest pain or pressure, no shortness of breath , no abdominal pain. No focal weakness.   Assessment  & Plan :   Abdominal pain with nausea/vomiting, history of chronic diarrhea, history of blood per rectum with recent colonoscopy and biopsy by Dr. Laural Golden at [facility] in December 2021 showing sigmoid ulcers which were nonspecific upon biopsy review: Initial concern for perforated viscus-however no evidence of perforated viscus on exploratory laparotomy.  Has tolerated advancement in diet.  No further recommendations by general surgery.  Has finished 5 days of IV Zosyn, now again LFTs high, repeat X Ray ++ free, DW CCS OR today for colectomy .  Did have blood per rectum on 12/29/2020 with acute blood loss related anemia on top of anemia of chronic disease.  2 units of packed RBC on 12/29/2020, IV PPI, soft diet and monitor.  Discussed the case with GI physician Dr. Benson Norway.  For now monitor as recent colonoscopy  results suggest sigmoid ulceration which is nonspecific.  Negative Tagged RBC scan.   Anemia: Secondary to CKD and acute illness + now acute LGI Bleed - transfused 2 Units 12/29/20.  Hypovolemic shock: Resolved-briefly required pressors in the ICU.  Home dose midodrine and Florinef resumed.  Asymptomatic transaminitis.  Due to hypotension and hypovolemia, transfused, now on midodrine, monitor blood pressure, holding statin, RUQ [place] non acute and INR stable, LFTs improving.  Acute hypoxic respiratory failure due to volume overload from missing HD: Currently stable on minimal oxygen.  ESRD: On HD TTS-nephrology following.  Chronic diarrhea: However abdominal x-ray on 12/29/2020 shows large amount of stool in his right lower quadrant, could have stool burden with liquefication diarrhea, will place on bowel regimen and hold Imodium on 12/30/2020 and monitor.    Orthostatic hypotension: Chronic issue-on midodrine/Florinef.  Per nephrology-on stretcher dialysis due to inability to stand up/sit up.  Midodrine dose increased on 12/29/2020.  Reduced beta-blocker dose on 12/29/2020.  Hypokalemia: Due to Florinef and diarrhea-replete and recheck.  Thrombocytopenia:?  Etiology-could have been due to COVID-19-HIT antibody negative.  Platelet count slowly improving.  Follow closely.   PAF with RVR: Back in sinus rhythm-on metoprolol and amiodarone-no longer on anticoagulation due to recent history of GI bleeding-and now with thrombocytopenia.  Follow closely.  Chronic debility/deconditioning: At the SNF for the past few months-extremely weak-Per patient-he is minimally ambulatory for the past few months.  COVID-19 infection: Incidental finding-supportive care at this point.  Initial Covid + 12/29/2020, this was incidental he is now off isolation.   Fever: afebrile.  O2 requirements:  SpO2: 92 % O2 Flow Rate (L/min): 2 L/min FiO2 (%): 40 %     Recent Labs  Lab 12/25/20 0039 12/26/20 0146  12/27/20 0214 12/29/20 0446 12/29/20 0728 12/29/20 2114 12/30/20 0327 12/31/20 0325  WBC 4.4 5.8 6.8 10.1 10.2 9.6 9.2 9.2  HGB 7.8* 7.6* 7.6* 6.5* 6.4* 8.4* 9.3* 9.4*  HCT 24.4* 24.3* 23.1* 22.3* 22.0* 27.0* 28.1* 28.9*  PLT 59* 67* 62* 64* 63* 70* 71* 65*  CRP 19.4* 14.2* 13.6*  --   --   --  15.8* 17.0*  BNP  --   --   --   --   --   --  743.7* 534.0*  AST 19 27 30   --   --   --  239* 200*  ALT 11 13 14   --   --   --  175* 161*  ALKPHOS 112 125 127*  --   --   --  167* 167*  BILITOT 0.9 0.7 1.1  --   --   --  1.3* 0.8  ALBUMIN 1.7* 1.5* 1.8* 1.6*  --   --  1.7* 1.6*  INR  --   --   --   --   --   --   --  1.1  LATICACIDVEN  --   --   --  2.9*  --   --   --   --       Nutrition Problem: Nutrition Problem: Increased nutrient needs Etiology: wound healing,catabolic illness,chronic illness Signs/Symptoms: estimated needs Interventions: Ensure Enlive (each supplement provides 350kcal and 20 grams of protein),Prostat   Sacral decubitus ulcer: On hydrotherapy per PT-evaluated by general surgery on 1/27-with recommendations to continue with hydrotherapy for few more days before consideration of discharge.  Addendum: Discussed with general surgery on 1/28-continue with hydrotherapy-General surgery will reassess on 1/31-and provide further recommendations regarding wound care.  Pressure Injury 12/05/20 Coccyx Medial;Upper Unstageable - Full thickness tissue loss in which the base of the injury is covered by slough (yellow, tan, gray, green or brown) and/or eschar (tan, brown or black) in the wound bed. prevous stage 2 on sacrum (Active)  12/05/20 1000  Location: Coccyx  Location Orientation: Medial;Upper  Staging: Unstageable - Full thickness tissue loss in which the base of the injury is covered by slough (yellow, tan, gray, green or brown) and/or eschar (tan, brown or black) in the wound bed.  Wound Description (Comments): prevous stage 2 on sacrum, now evolved to unstageable. Black  eschar covering wound bed  Present on Admission: No       Condition - Stable  Family Communication  : Daughter-[name]-[phone]-on 12/27/20, 12/30/20, 12/24/2020  Code Status :  Full Code  Diet :  Diet Order            DIET SOFT Room service appropriate? No; Fluid consistency: Thin  Diet effective now                  Disposition Plan  :   Status is: Inpatient  Remains inpatient appropriate because:Inpatient level of care appropriate due to severity of illness   Dispo: The patient is from: SNF              Anticipated d/c is to: SNF              Anticipated d/c date is: > 3 days              Patient currently is not medically stable to d/c.   Difficult to place patient No   Barriers to discharge: Needs inpatient hydrotherapy-for for sacral wound stabilization before consideration of discharge to SNF.  Antimicorbials  :    Anti-infectives (From admission, onward)   Start     Dose/Rate Route Frequency Ordered Stop   12/22/2020 1500  piperacillin-tazobactam (ZOSYN) IVPB 3.375 g        3.375 g 12.5 mL/hr over 240 Minutes Intravenous Every 12 hours 12/03/2020 1219 12/24/20 2001   12/25/2020 1200  piperacillin-tazobactam (ZOSYN) IVPB 2.25 g  Status:  Discontinued        2.25 g 100 mL/hr over 30 Minutes Intravenous Every 8 hours 12/09/2020 0205 12/10/2020 1219  12/12/2020 0215  piperacillin-tazobactam (ZOSYN) IVPB 3.375 g        3.375 g 100 mL/hr over 30 Minutes Intravenous  Once 12/16/2020 0205 12/24/2020 0435      Inpatient Medications  Scheduled Meds: . (feeding supplement) PROSource Plus  30 mL Oral TID with meals  . amiodarone  200 mg Oral BID  . Chlorhexidine Gluconate Cloth  6 each Topical Q0600  . famotidine  20 mg Oral Daily  . feeding supplement  1 Container Oral TID BM  . feeding supplement  237 mL Oral BID BM  . fludrocortisone  0.2 mg Oral BID  . insulin aspart  0-15 Units Subcutaneous TID WC  . insulin aspart  0-5 Units Subcutaneous QHS  . metoprolol tartrate   12.5 mg Oral BID  . midodrine  10 mg Oral TID WC  . multivitamin  1 tablet Oral QHS  . mupirocin ointment   Nasal BID  . pantoprazole (PROTONIX) IV  40 mg Intravenous Q12H  . polyethylene glycol  17 g Oral BID  . pyridostigmine  60 mg Oral Daily  . sodium bicarbonate  100 mEq Intravenous Once   Continuous Infusions:  PRN Meds:.fentaNYL (SUBLIMAZE) injection, guaiFENesin, oxyCODONE, sodium chloride flush   Time Spent in minutes  15  See all Orders from today for further details   Lala Lund M.D on 12/18/2020 at 10:34 AM  To page go to [email or web address] - use universal password  [facility] -  Office  [phone]    Objective:   Vitals:   12/30/20 2215 12/30/20 2355 12/24/2020 0405 12/20/2020 0722  BP: 124/63 104/63 118/71 114/69  Pulse: 78 81 74 71  Resp:  15 14 18   Temp:  99.2 F (37.3 C) 99 F (37.2 C) 98.8 F (37.1 C)  TempSrc:  Oral Oral Oral  SpO2:  91% 91% 92%  Weight:   68.3 kg   Height:        Wt Readings from Last 3 Encounters:  12/21/2020 68.3 kg  12/18/20 61.2 kg  12/10/20 66.3 kg     Intake/Output Summary (Last 24 hours) at 12/20/2020 1034 Last data filed at 12/30/2020 0900 Gross per 24 hour  Intake 300 ml  Output -  Net 300 ml     Physical Exam  Awake Alert, severe chronic bilateral lower extremity weakness and has been bedbound for several months, kindly look at general surgery notes and picture for description of his sacral decubitus ulcer [place].AT,PERRAL Supple Neck,No JVD, No cervical lymphadenopathy appriciated.  Symmetrical Chest wall movement, Good air movement bilaterally, CTAB RRR,No Gallops, Rubs or new Murmurs, No Parasternal Heave +ve B.Sounds, Abd Soft, No tenderness, No organomegaly appriciated, No rebound - guarding or rigidity.     Data Review:    CBC Recent Labs  Lab 12/29/20 0446 12/29/20 0728 12/29/20 2114 12/30/20 0327 12/24/2020 0325  WBC 10.1 10.2 9.6 9.2 9.2  HGB 6.5* 6.4* 8.4* 9.3* 9.4*  HCT 22.3* 22.0*  27.0* 28.1* 28.9*  PLT 64* 63* 70* 71* 65*  MCV 99.6 100.9* 95.7 92.4 93.2  MCH 29.0 29.4 29.8 30.6 30.3  MCHC 29.1* 29.1* 31.1 33.1 32.5  RDW 19.0* 19.3* 18.3* 18.3* 18.6*  LYMPHSABS 0.7  --   --  0.8 0.6*  MONOABS 0.5  --   --  0.5 0.4  EOSABS 0.0  --   --  0.1 0.0  BASOSABS 0.0  --   --  0.0 0.0    Chemistries  Recent Labs  Lab 12/25/20 0039 12/26/20 0146  12/27/20 0214 12/29/20 0446 12/30/20 0327 12/30/2020 0325  NA 134* 135 137 132* 134* 134*  K 2.6* 3.9 3.7 4.4 4.3 4.3  CL 98 103 102 100 101 100  CO2 20* 16* 21* 19* 19* 16*  GLUCOSE 155* 116* 153* 115* 122* 122*  BUN 47* 59* 36* 55* 66* 79*  CREATININE 3.91* 4.54* 3.43* 5.05* 5.42* 6.18*  CALCIUM 6.9* 7.0* 7.3* 7.5* 7.6* 7.6*  MG  --  1.6*  --   --  2.1 2.0  AST 19 27 30   --  239* 200*  ALT 11 13 14   --  175* 161*  ALKPHOS 112 125 127*  --  167* 167*  BILITOT 0.9 0.7 1.1  --  1.3* 0.8   ------------------------------------------------------------------------------------------------------------------ No results for input(s): CHOL, HDL, LDLCALC, TRIG, CHOLHDL, LDLDIRECT in the last 72 hours.  Lab Results  Component Value Date   HGBA1C 4.1 (L) 12/21/2020   ------------------------------------------------------------------------------------------------------------------ No results for input(s): TSH, T4TOTAL, T3FREE, THYROIDAB in the last 72 hours.  Invalid input(s): FREET3 ------------------------------------------------------------------------------------------------------------------ No results for input(s): VITAMINB12, FOLATE, FERRITIN, TIBC, IRON, RETICCTPCT in the last 72 hours.  Coagulation profile Recent Labs  Lab 12/06/2020 0325  INR 1.1    No results for input(s): DDIMER in the last 72 hours.  Cardiac Enzymes No results for input(s): CKMB, TROPONINI, MYOGLOBIN in the last 168 hours.  Invalid input(s):  CK ------------------------------------------------------------------------------------------------------------------    Component Value Date/Time   BNP 534.0 (H) 12/22/2020 0325    Micro Results Recent Results (from the past 240 hour(s))  MRSA PCR Screening     Status: Abnormal   Collection Time: 12/21/20 11:39 PM  Result Value Ref Range Status   MRSA by PCR POSITIVE (A) NEGATIVE Final    Comment:        The GeneXpert MRSA Assay (FDA approved for NASAL specimens only), is one component of a comprehensive MRSA colonization surveillance program. It is not intended to diagnose MRSA infection nor to guide or monitor treatment for MRSA infections. RESULT CALLED TO, READ BACK BY AND VERIFIED WITH: D TUCK RN 12/22/20 0134 JDW Performed at [facility], [address], [place], [place] [address]     Radiology Reports CT Abdomen Pelvis Wo Contrast  Result Date: 12/21/2020 CLINICAL DATA:  Abdominal pain EXAM: CT ABDOMEN AND PELVIS WITHOUT CONTRAST TECHNIQUE: Multidetector CT imaging of the abdomen and pelvis was performed following the standard protocol without IV contrast. COMPARISON:  November 07, 2020 and October 22, 2020 FINDINGS: Lower chest: The visualized heart size within normal limits. No pericardial fluid/thickening. No hiatal hernia. Small bilateral pleural effusions are seen. Hepatobiliary: A small amount of pneumobilia is seen in the inferior right liver lobe and surrounding the posterior gallbladder. A distended fluid and contrast filled gallbladder is seen, likely due to vicarious contrast excretion. There is small layering gallstones. Pancreas:  Unremarkable.  No surrounding inflammatory changes. Spleen: Normal in size. Although limited due to the lack of intravenous contrast, normal in appearance. Adrenals/Urinary Tract: Both adrenal glands appear normal. Multiple bilateral renal calculi are again identified. The largest within the upper pole of the right kidney measuring  5 mm and the largest within the left kidney measuring 4 mm within the midpole. No hydronephrosis. The bladder is decompressed with a Foley catheter. Stomach/Bowel: Small amount of contrast reflux seen within the distal esophagus. There is mildly prominent contrast filled loops of jejunum seen within the left upper quadrant without a clear transition point the remainder of the small bowel is  decompressed. There is been interval increased air and stool-filled dilation of the rectum and sigmoid colon to the level of the descending colon junction. At the level of the descending colon/sigmoid colon junction there is focal wall thickening and a small amount of free air seen anteriorly, consistent with a probable transition point and perforation. There is air seen surrounding the stool at the level of the sigmoid rectal junction which could be concerning for pneumatosis. Vascular/Lymphatic: Scattered aortic atherosclerosis is noted. Reproductive: The prostate is unremarkable. Other: There is a moderate amount of free air seen under the hemidiaphragms and is seen surrounding the porta hepatis. Musculoskeletal: Bilateral sacral decubitus ulceration seen over the inferior coccyx with debris and foci of air. There is non loculated fluid in this area. Degenerative changes seen throughout the lumbar spine most notable of L2-L3. IMPRESSION: 1. There is a focal area of wall thickening with adjacent perforation of the distal descending colon at the junction of the sigmoid colon, likely the transition point. 2. There is moderately dilated sigmoid colon and rectum which is stool-filled and suggestion of possible pneumatosis. 3. Free air seen under the right hemidiaphragm and within the porta hepatis. 4. Small amount of air seen within the inferior right liver lobe which is concerning for portal venous gas given the patient's findings 5. Bilateral sacral decubitus ulceration with phlegmon and subcutaneous emphysema extending to the  inferior coccyx. 6. These results were called by telephone at the time of interpretation on 12/19/2020 at 2:01 am to provider PA Sammy , who verbally acknowledged these results. Electronically Signed   By: Prudencio Pair M.D.   On: 12/15/2020 02:06   NM GI Blood Loss  Result Date: 12/29/2020 CLINICAL DATA:  GI bleeding EXAM: NUCLEAR MEDICINE GASTROINTESTINAL BLEEDING SCAN TECHNIQUE: Sequential abdominal images were obtained following intravenous administration of Tc-59m labeled red blood cells. RADIOPHARMACEUTICALS:  mCi Tc-100m pertechnetate in-vitro labeled red cells. COMPARISON:  None. FINDINGS: Initial images and demonstrates activity in the cardiac blood pool is well as the liver and spleen. Some background activity is noted throughout the bowel which shows no significant interval change to suggest active GI hemorrhage. Imaging into the second hour shows no focal area of increased activity to suggest active significant GI hemorrhage. IMPRESSION: No definitive GI hemorrhage is identified. Electronically Signed   By: Inez Catalina M.D.   On: 12/29/2020 19:11   DG Chest Port 1 View  Result Date: 12/24/2020 CLINICAL DATA:  Dyspnea EXAM: PORTABLE CHEST 1 VIEW COMPARISON:  12/09/2020 FINDINGS: Interval extubation. Nasogastric tube is been removed. Pulmonary insufflation has diminished slightly. There is interval development of bilateral perihilar and lower lung zone airspace infiltrate, likely infectious or inflammatory. No pneumothorax or pleural effusion. Right internal jugular hemodialysis catheter tip noted within the right atrium. Cardiac size within normal limits. The pulmonary vascularity is normal. Massive pneumoperitoneum is again identified below the hemidiaphragm. IMPRESSION: Interval extubation.  Diminished pulmonary insufflation. Interval development of bilateral mid and lower lung zone airspace infiltrate, likely infectious or inflammatory. Massive pneumoperitoneum again noted. Electronically Signed    By: Fidela Salisbury MD   On: 12/19/2020 09:05   DG CHEST PORT 1 VIEW  Result Date: 12/30/2020 CLINICAL DATA:  Intubation EXAM: PORTABLE CHEST 1 VIEW COMPARISON:  Ten days ago FINDINGS: Endotracheal tube with tip at the clavicular heads. The enteric tube reaches the stomach with side port near the GE junction. Dialysis catheter with tips at the upper right atrium. Large volume pneumoperitoneum, known prior CT. Hazy density at the lung  bases from pleural fluid primarily based on CT. IMPRESSION: 1. Enteric tube side-port is at the GE junction. Otherwise unremarkable hardware. 2. Hazy density at the bases from small pleural effusions by prior CT. 3. Pneumoperitoneum as seen on prior CT. Electronically Signed   By: Monte Fantasia M.D.   On: 12/21/2020 07:59   DG Chest Port 1 View  Result Date: 12/10/2020 CLINICAL DATA:  Status post exchange of a dialysis catheter. EXAM: PORTABLE CHEST 1 VIEW COMPARISON:  Single-view of the chest 12/07/2020. FINDINGS: New right IJ approach dialysis catheter is in place with its tip at the superior cavoatrial junction. No pneumothorax. Lungs are clear. Heart size is normal. No acute or focal bony abnormality. IMPRESSION: Dialysis catheter tip projects at the superior cavoatrial junction. Negative for pneumothorax or acute disease. Electronically Signed   By: Inge Rise M.D.   On: 12/10/2020 12:09   DG Chest Port 1 View  Result Date: 12/07/2020 CLINICAL DATA:  Post RIGHT-side dialysis catheter exchange EXAM: PORTABLE CHEST 1 VIEW COMPARISON:  Portable exam 1240 hours compared to 1003 hours FINDINGS: RIGHT jugular dual-lumen central venous catheter with tip projecting over SVC. Normal heart size, mediastinal contours, and pulmonary vascularity. Atherosclerotic calcification aorta. Minimal chronic peribronchial thickening without pulmonary infiltrate, pleural effusion, or pneumothorax. Bones mildly demineralized. IMPRESSION: No pneumothorax following RIGHT jugular line  placement. Minimal persistent bronchitic changes. Aortic Atherosclerosis (ICD10-I70.0). Electronically Signed   By: Lavonia Dana M.D.   On: 12/07/2020 12:55   DG Chest Port 1 View  Result Date: 12/06/2020 CLINICAL DATA:  End-stage renal disease. EXAM: PORTABLE CHEST 1 VIEW COMPARISON:  October 29, 2020. FINDINGS: The heart size and mediastinal contours are within normal limits. Both lungs are clear. Right internal jugular dialysis catheter is unchanged in position with distal tip in expected position of cavoatrial junction. The visualized skeletal structures are unremarkable. IMPRESSION: No active disease. Electronically Signed   By: Marijo Conception M.D.   On: 12/06/2020 10:34   DG Abd 2 Views  Result Date: 12/29/2020 CLINICAL DATA:  Free air EXAM: ABDOMEN - 2 VIEW COMPARISON:  None. FINDINGS: Massive pneumoperitoneum persists. There is a large amount of stool visible in the right lower quadrant. No dilated small bowel. IMPRESSION: 1. Persistent massive pneumoperitoneum. No visible small bowel dilatation. 2. Large amount of stool in the right lower quadrant. Electronically Signed   By: Ulyses Jarred M.D.   On: 12/29/2020 00:59   DG Abd Portable 1V  Result Date: 12/06/2020 CLINICAL DATA:  Constipation EXAM: PORTABLE ABDOMEN - 1 VIEW COMPARISON:  12/29/2020 FINDINGS: The subdiaphragmatic region is excluded from view. Massive pneumoperitoneum is again identified. Laparotomy skin staples overlie the mid abdomen. Note that the degree of pneumoperitoneum is more than would be typically expected following laparotomy on 12/02/2020. Moderate stool is seen throughout the colon and within the rectal vault. There is loss of typical how Stroll markings involving the a descending and sigmoid colon with apparent wall thickening of the bowel in keeping with a long segment colitis within this region. IMPRESSION: Massive pneumoperitoneum again noted. Note that this typically more than would be expected given laparotomy on  12/18/2020 and an ongoing air leak secondary to visceral perforation should be considered. Repeat CT imaging with oral contrast may be helpful to identify the source of leak. Suspected long segment colitis involving the distal descending and sigmoid colon. Electronically Signed   By: Fidela Salisbury MD   On: 12/28/2020 09:09   DG C-Arm 1-60 Min-No Report  Result Date:  12/10/2020 Fluoroscopy was utilized by the requesting physician.  No radiographic interpretation.   DG C-Arm 1-60 Min-No Report  Result Date: 12/07/2020 Fluoroscopy was utilized by the requesting physician.  No radiographic interpretation.   US Abdomen Limited RUQ (LIVER/GB)  Result Date: 12/30/2020 CLINICAL DATA:  Transaminitis. EXAM: ULTRASOUND ABDOMEN LIMITED RIGHT UPPER QUADRANT COMPARISON:  None. FINDINGS: Gallbladder: Gallbladder is distended and filled with sludge. Borderline gallbladder wall thickening. No sonographic Murphy sign elicited. Common bile duct: Diameter: 4 mm Liver: No focal lesion identified. Diffusely echogenic indicating fatty infiltration. Portal vein is patent on color Doppler imaging with normal direction of blood flow towards the liver. Other: RIGHT upper quadrant ascites.  RIGHT-sided pleural effusion. IMPRESSION: 1. Gallbladder is distended and filled with sludge. No convincing sonographic evidence of acute cholecystitis. 2. No bile duct dilatation. 3. Fatty infiltration of the liver. 4. Ascites. Electronically Signed   By: Franki Cabot M.D.   On: 12/30/2020 15:46

## 2020-12-31 NOTE — Progress Notes (Signed)
PT Cancellation Note  Patient Details Name: OAKLYN MANS MRN: [id number] DOB: 22-Aug-1951   Cancelled Treatment:    Reason Eval/Treat Not Completed: (P) Patient at procedure or test/unavailable Pt in OR for colectomy. PT will follow back for treatment tomorrow.   Kymberlee Viger B. Migdalia Dk PT, DPT Acute Rehabilitation Services Pager [phone] Office [phone]    [place] 12/26/2020, 12:27 PM

## 2020-12-31 NOTE — Anesthesia Procedure Notes (Signed)
Arterial Line Insertion Start/End01/01/2021 12:58 PM, 12/31/2020 1:01 PM Performed by: Darral Dash, DO, anesthesiologist  Patient location: OR. Preanesthetic checklist: patient identified, IV checked, site marked, risks and benefits discussed, surgical consent, monitors and equipment checked, pre-op evaluation, timeout performed and anesthesia consent Left, radial was placed Catheter size: 20 Fr Hand hygiene performed  and maximum sterile barriers used   Attempts: 1 Procedure performed without using ultrasound guided technique. Following insertion, dressing applied and Biopatch. Post procedure assessment: normal and unchanged

## 2020-12-31 NOTE — Progress Notes (Signed)
RT advanced ETT from 23 cm at lip to 25 cm per MD order.

## 2020-12-31 NOTE — Anesthesia Procedure Notes (Signed)
Procedure Name: Intubation Date/Time: 12/30/2020 12:46 PM Performed by: Renato Shin, CRNA Pre-anesthesia Checklist: Patient identified, Emergency Drugs available, Suction available and Patient being monitored Patient Re-evaluated:Patient Re-evaluated prior to induction Oxygen Delivery Method: Circle system utilized Preoxygenation: Pre-oxygenation with 100% oxygen Induction Type: IV induction and Rapid sequence Laryngoscope Size: Miller and 2 Grade View: Grade II Tube type: Oral Tube size: 7.5 mm Number of attempts: 1 Airway Equipment and Method: Stylet and Oral airway Placement Confirmation: ETT inserted through vocal cords under direct vision,  positive ETCO2 and breath sounds checked- equal and bilateral Secured at: 23 cm Tube secured with: Tape Dental Injury: Teeth and Oropharynx as per pre-operative assessment

## 2020-12-31 NOTE — Progress Notes (Signed)
PHARMACY - TOTAL PARENTERAL NUTRITION CONSULT NOTE   Indication: Massive bowel resection with severe malnutrition  Patient Measurements: Height: 6\' 2"  (188 cm) Weight: 68.3 kg (150 lb 9.2 oz) IBW/kg (Calculated) : 82.2 TPN AdjBW (KG): 69.2 Body mass index is 19.33 kg/m.  Assessment: 70 year old male with diabetes, ESRD on HD, TTS, chronic diarrhea, chronic hypotension, PAF - systemic AC on hold w/hx of GIB, admitted 1/19 from SNF with N/V and abd pain found to have pneumoperitoneum.  Underwent laparotomy 1/20 without findings of bowel perforation.  Incidentally COVID positive.  He was transferred to [facility] on 1/22.  He was doing better however his LFTs elevated on 1/30 and KUB 1/31 showed worsening intraperitoneal air.  Of note he has previously had multiple ulcers in the sigmoid colon of nonspecific etiology.  He was taken back to the OR 1/31 and underwent exploratory laparotomy with end colostomy and left colectomy- noting that entire left colon was boggy thought secondary to ischemia; also noted to have ulcerated area on anterior rectum. Pharmacy consulted   Central access: Double lumen hemodialysis catheter TPN start date: 01/01/21  Nutritional Goals (per RD recommendation on 1/27; may have new goals since major surgery - will need to concentrate due to ESRD): Kcal:  2000-2200  kcals; Protein:  100-120 g; Fluid:  1000 mL plus UOP  Current Nutrition:  NPO  Plan:  TPN cannot be made today, consult received too late Will plan to start TPN tomorrow Will draw TPN labs for in the am  Alanda Slim, PharmD, [facility] Clinical Pharmacist Please see AMION for all Pharmacists' Contact Phone Numbers 12/10/2020, 4:03 PM

## 2020-12-31 NOTE — Progress Notes (Addendum)
11 Days Post-Op    CC: Pneumoperitoneum  Subjective: Seen for wound, but he has significant pneumoperitoneum. He has llq pain    Objective: Vital signs in last 24 hours: Temp:  [98.8 F (37.1 C)-99.2 F (37.3 C)] 98.8 F (37.1 C) (01/31 0722) Pulse Rate:  [71-81] 71 (01/31 0722) Resp:  [14-20] 18 (01/31 0722) BP: (104-173)/(63-88) 114/69 (01/31 0722) SpO2:  [91 %-93 %] 92 % (01/31 0722) Weight:  [68.3 kg] 68.3 kg (01/31 0405) Last BM Date: 12/10/2020 180 PO Stool x 3 T-max 99.3 Vital signs stable Creatinine 6.18 AST 200, ALT 161, total bilirubin 0.8 Intake/Output from previous day: 01/30 0701 - 01/31 0700 In: 180 [P.O.:180] Out: -  Intake/Output this shift: No intake/output data recorded.  GI: healed incision, tender llq to palpation with localized peritonitis, distended  4 cm of undermining with ongoing malodorous drainage at the end of the applicator stick, mild cellulitis around the site.  Lab Results:  Recent Labs    12/30/20 0327 12/20/2020 0325  WBC 9.2 9.2  HGB 9.3* 9.4*  HCT 28.1* 28.9*  PLT 71* 65*    BMET Recent Labs    12/30/20 0327 12/24/2020 0325  NA 134* 134*  K 4.3 4.3  CL 101 100  CO2 19* 16*  GLUCOSE 122* 122*  BUN 66* 79*  CREATININE 5.42* 6.18*  CALCIUM 7.6* 7.6*   PT/INR Recent Labs    12/28/2020 0325  LABPROT 13.7  INR 1.1    Recent Labs  Lab 12/25/20 0039 12/26/20 0146 12/27/20 0214 12/29/20 0446 12/30/20 0327 12/08/2020 0325  AST 19 27 30   --  239* 200*  ALT 11 13 14   --  175* 161*  ALKPHOS 112 125 127*  --  167* 167*  BILITOT 0.9 0.7 1.1  --  1.3* 0.8  PROT 4.7* 4.6* 4.8*  --  4.6* 4.7*  ALBUMIN 1.7* 1.5* 1.8* 1.6* 1.7* 1.6*     Lipase     Component Value Date/Time   LIPASE 14 12/31/2020 0040     Medications: . (feeding supplement) PROSource Plus  30 mL Oral TID with meals  . amiodarone  200 mg Oral BID  . Chlorhexidine Gluconate Cloth  6 each Topical Q0600  . famotidine  20 mg Oral Daily  . feeding  supplement  1 Container Oral TID BM  . feeding supplement  237 mL Oral BID BM  . fludrocortisone  0.2 mg Oral BID  . insulin aspart  0-15 Units Subcutaneous TID WC  . insulin aspart  0-5 Units Subcutaneous QHS  . metoprolol tartrate  12.5 mg Oral BID  . midodrine  10 mg Oral TID WC  . multivitamin  1 tablet Oral QHS  . mupirocin ointment   Nasal BID  . pantoprazole (PROTONIX) IV  40 mg Intravenous Q12H  . polyethylene glycol  17 g Oral BID  . pyridostigmine  60 mg Oral Daily  . sodium bicarbonate  100 mEq Intravenous Once    Assessment/Plan Pneumoperitoneum, pneumatosis, portal venous gas POD 12 s/p negativeexploratory laparotomy1/20 Dr. Bobbye Morton - intraop: no acute findings, no contamination, excellent blood flow to intestine,congested rectosigmoid colon - xray today with a lot of pneumoperitoneum and he is tender, I think that he needs to return to OR urgently for this and it appears that he has perforation in left/sigmoid colon, discussed risks with him and colostomy, he does not want me to call a family member  Chronic stage IV sacral decubitus pressure ulcer - wound evaluated today - no  indication for emergent surgical debridement but patient would benefit from continuing hydrotherapy for a few more days. I don't think he any further imaging - d/c dakins and continue BID wet to dry dressing changes with saline  ID -zosyn 1/20>1/25, restart today FEN - npo VTE -SCDs      LOS: 11 days    Carlis Blanchard 12/10/2020 Please see Amion

## 2020-12-31 NOTE — Progress Notes (Signed)
Physical Therapy Wound Treatment Patient Details  Name: [name] MRN: [id number] Date of Birth: 11-Jan-1951  Today's Date: 12/01/2020 Time: 7680-8811 Time Calculation (min): 46 min  Subjective  Subjective: Pt asleep upon arrival; agreeable to hydrotherapy Patient and Family Stated Goals: heal wound Date of Onset:  (unknown - pt reports at least a month) Prior Treatments: unknown  Pain Score:  10/10 (with sharp debridement; despite premedication)  Wound Assessment  Pressure Injury 12/05/20 Coccyx Medial;Upper Unstageable - Full thickness tissue loss in which the base of the injury is covered by slough (yellow, tan, gray, green or brown) and/or eschar (tan, brown or black) in the wound bed. prevous stage 2 on sacrum (Active)  Dressing Type ABD;Barrier Film (skin prep);Gauze (Comment) 12/16/2020 1255  Dressing Changed;Clean;Intact;Dry 12/27/2020 1255  Dressing Change Frequency Twice a day 12/08/2020 1255  State of Healing Early/partial granulation 12/05/2020 1255  Site / Wound Assessment Black;Yellow;Red;Bleeding 12/05/2020 1255  % Wound base Red or Granulating 30% 12/25/2020 1255  % Wound base Yellow/Fibrinous Exudate 50% 12/18/2020 1255  % Wound base Black/Eschar 20% 12/12/2020 1255  % Wound base Other/Granulation Tissue (Comment) 0% 12/23/2020 1255  Peri-wound Assessment Erythema (non-blanchable);Pink 12/24/2020 1255  Wound Length (cm) 10 cm 12/26/20 1253  Wound Width (cm) 6 cm 12/26/20 1253  Wound Depth (cm) 1 cm 12/26/20 1253  Wound Surface Area (cm^2) 60 cm^2 12/26/20 1253  Wound Volume (cm^3) 60 cm^3 12/26/20 1253  Tunneling (cm) 5 cm at 8 o clock 12/26/20 1253  Undermining (cm) 1-2 cm at 12-5 and 4-5 cm at 7-11 12/28/20 1246  Margins Unattached edges (unapproximated) 12/11/2020 1255  Drainage Amount Moderate 12/04/2020 1255  Drainage Description Serosanguineous;Purulent 12/05/2020 1255  Treatment Debridement (Selective);Hydrotherapy (Pulse lavage);Packing (Saline gauze) 12/30/2020 1255   Santyl  applied to wound bed prior to applying dressing.    Hydrotherapy Pulsed lavage therapy - wound location: sacrum Pulsed Lavage with Suction (psi): 12 psi (4-12) Pulsed Lavage with Suction - Normal Saline Used: 1000 mL Pulsed Lavage Tip: Tip with splash shield Selective Debridement Selective Debridement - Location: sacrum Selective Debridement - Tools Used: Scalpel;Scissors;Forceps Selective Debridement - Tissue Removed: eschar, brown slough, black necrotic edges   Wound Assessment and Plan  Wound Therapy - Assess/Plan/Recommendations Wound Therapy - Clinical Statement: Wound with mildly increased granulation tissue. Continues with foul odor and increased purulent drainage. Pt with increased pain with sharp debridement today. Will continue hydro and selective debridement to both clean up necrosis and decrease the bioburden. Wound Therapy - Functional Problem List: global weakness and immobility Factors Delaying/Impairing Wound Healing: Immobility;Multiple medical problems Hydrotherapy Plan: Debridement;Dressing change;Patient/family education;Pulsatile lavage with suction Wound Therapy - Frequency: 6X / week Wound Therapy - Follow Up Recommendations: Skilled nursing facility Wound Plan: [name] d/c'd, but discharged held to continue debridement and management of notable infection.  Wound Therapy Goals- Improve the function of patient's integumentary system by progressing the wound(s) through the phases of wound healing (inflammation - proliferation - remodeling) by: Decrease Necrotic Tissue to: 75 Decrease Necrotic Tissue - Progress: Progressing toward goal Increase Granulation Tissue to: 25 Increase Granulation Tissue - Progress: Progressing toward goal Goals/treatment plan/discharge plan were made with and agreed upon by patient/family: Yes Time For Goal Achievement: 7 days Wound Therapy - Potential for Goals: Good  Goals will be updated until maximal potential achieved or discharge  criteria met.  Discharge criteria: when goals achieved, discharge from hospital, MD decision/surgical intervention, no progress towards goals, refusal/missing three consecutive treatments without notification or medical reason.  GP  Wyona Almas, PT, DPT Acute Rehabilitation Services Pager [phone] Office [phone]   [name] 12/01/2020, 1:04 PM

## 2020-12-31 NOTE — Transfer of Care (Signed)
Immediate Anesthesia Transfer of Care Note  Patient: [name]  Procedure(s) Performed: EXPLORATORY LAPAROTOMY (N/A Abdomen) LEFT COLECTOMY (N/A Abdomen) TRANSVERSE COLOSTOMY (N/A Abdomen)  Patient Location: ICU  Anesthesia Type:General  Level of Consciousness: sedated and Patient remains intubated per anesthesia plan  Airway & Oxygen Therapy: Patient remains intubated per anesthesia plan and Patient placed on Ventilator (see vital sign flow sheet for setting)  Post-op Assessment: Report given to RN and Post -op Vital signs reviewed and stable  Post vital signs: Reviewed and stable  Last Vitals:  Vitals Value Taken Time  BP 122/73 12/05/2020 1501  Temp    Pulse 56 12/08/2020 1506  Resp 18 12/20/2020 1506  SpO2 100 % 12/12/2020 1506  Vitals shown include unvalidated device data.  Last Pain:  Vitals:   12/26/2020 1105  TempSrc:   PainSc: Asleep      Patients Stated Pain Goal: 2 (84/21/03 1281)  Complications: No complications documented.

## 2020-12-31 NOTE — Op Note (Signed)
Preoperative diagnosis: Pneumoperitoneum Postoperative diagnosis: Same as above Procedure: 1.  Exploratory laparotomy 2.  Left colectomy 3.  End colostomy Surgeon: Dr Serita Grammes  Asst: Alferd Apa, PA-C EBL: 50 cc Anesthesia general Complications none Drains 19 Fr Blake drain to pelvis Specimens Left colon stitch proximal Sponge and needle count correct dispo ICU  Indications: This is a 70 year old male with Covid who is just come off of his precautions who on January 20 underwent exploratory laparotomy for pneumoperitoneum, pneumatosis, and portal venous gas without any intraoperative findings.  He also has a pressure ulcer now.  He was noted on an abdominal and a chest x-ray today that he had massive pneumoperitoneum.  When I evaluated him he had focal left lower quadrant peritoneal signs.  I discussed going back to the operating room.  I reviewed his CT scan and his operation report from the last time.  All of the evidence points to the left colon is being the source of the problem.  Procedure: After informed consent was obtained from the patient he was taken to the operating room.  He was given antibiotics.  SCDs were in place.  A Foley catheter was placed.  A nasogastric tube was also placed.  He was first placed under general anesthesia and then prepped and draped in the standard sterile surgical fashion.  Surgical timeout was then performed.  I remove the staples from his incision that was done 12 days ago.  I then released the sutures.  There was live very little healing of his wound at this point.  I then explored his abdomen.  He had a fair amount of pneumoperitoneum that was released when I opened the incision.  He also had murky brownish fluid on the left and the right side.  I examined his stomach and was without any abnormality.  His small bowel was run without any abnormality.  He had a very distended gallbladder but this was not the source of the problem either.  His cecum  and appendix were both healthy and going into his distal transverse colon this was all healthy.  It took me some time to release his splenic flexure is his omentum was adherent to it.  I then released the white line of Toldt on the left side and he had a very redundant sigmoid colon.  The entire left colon felt very boggy and it appeared that it had some ischemia associated with it.  I cannot identify a definitive source of perforation but this portion of the colon did appear mildly ischemic.  I took this down to the top portion of his rectum.  There was what looked to be an ulcerated area on the anterior rectum very high as well.  I elected to do a left colectomy.  I then divided his transverse colon with a GIA stapler.  I came around his rectum and divided this with a contour stapler.  I then took the mesentery with a combination of the LigaSure device and 2-0 silk sutures.  This was then passed off the table as a specimen.  Hemostasis was obtained.  I placed a Prolene suture in his rectal stump.  I then irrigated copiously.  I ran the small bowel again and again with this was without any injury or abnormality.  I then made a hole in his skin on the left side.  I took this down and made a cruciate incision in the fascia.  I split the muscle and incised the into the peritoneum.  I then dilated this and pulled the transverse colon through.  This went easily and was viable.  I then closed the fascia with a #1 looped PDS with numerous intermittent #1 Novafil's.  I left this open ended a wet-to-dry dressing.  The stoma was matured with 3-0 Vicryl suture.  An appliance was applied.  He did have a drain placed in his pelvis that I secured with a 2-0 nylon.  During this he was on Levophed and Neo-Synephrine.  He is going to remain intubated and be transferred to the ICU.

## 2020-12-31 NOTE — Consult Note (Signed)
Nice Nurse wound follow up Please see PT hydrotherapy note, and photo from today. Sacral wound appears much cleaner that last week.  Unfortunately, the patient had to be emergently taken to OR for an unrelated condition. For now, continue the POC for the sacral wound. Val Riles, RN, MSN, CWOCN, CNS-BC, pager [phone]

## 2020-12-31 NOTE — Progress Notes (Signed)
Patient ID: [name], male   DOB: [birth year], 70 y.o.   MRN: [id number] S: Denies any significant abominal pain, however abd imaging with worsening pneumoperitoneum and plan for surgery today. O:BP 114/69 (BP Location: Right Arm)   Pulse 71   Temp 98.8 F (37.1 C) (Oral)   Resp 18   Ht 6\' 2"  (1.88 m)   Wt 68.3 kg   SpO2 92%   BMI 19.33 kg/m   Intake/Output Summary (Last 24 hours) at 12/30/2020 1213 Last data filed at 12/30/2020 0900 Gross per 24 hour  Intake 300 ml  Output --  Net 300 ml   Intake/Output: I/O last 3 completed shifts: In: 998.8 [P.O.:280; I.V.:250; Blood:468.8] Out: 400 [Urine:400]  Intake/Output this shift:  Total I/O In: 120 [P.O.:120] Out: -  Weight change:  Gen: chronically ill-appearing WM CVS: RRR Resp: cta Abd: Hypoactive BS, + tenderness to palpation Ext: 1+ pedal edema  Recent Labs  Lab 12/25/20 0039 12/26/20 0146 12/27/20 0214 12/29/20 0446 12/30/20 0327 12/29/2020 0325  NA 134* 135 137 132* 134* 134*  K 2.6* 3.9 3.7 4.4 4.3 4.3  CL 98 103 102 100 101 100  CO2 20* 16* 21* 19* 19* 16*  GLUCOSE 155* 116* 153* 115* 122* 122*  BUN 47* 59* 36* 55* 66* 79*  CREATININE 3.91* 4.54* 3.43* 5.05* 5.42* 6.18*  ALBUMIN 1.7* 1.5* 1.8* 1.6* 1.7* 1.6*  CALCIUM 6.9* 7.0* 7.3* 7.5* 7.6* 7.6*  PHOS  --   --   --  4.9*  --   --   AST 19 27 30   --  239* 200*  ALT 11 13 14   --  175* 161*   Liver Function Tests: Recent Labs  Lab 12/27/20 0214 12/29/20 0446 12/30/20 0327 12/09/2020 0325  AST 30  --  239* 200*  ALT 14  --  175* 161*  ALKPHOS 127*  --  167* 167*  BILITOT 1.1  --  1.3* 0.8  PROT 4.8*  --  4.6* 4.7*  ALBUMIN 1.8* 1.6* 1.7* 1.6*   No results for input(s): LIPASE, AMYLASE in the last 168 hours. No results for input(s): AMMONIA in the last 168 hours. CBC: Recent Labs  Lab 12/29/20 0446 12/29/20 0728 12/29/20 2114 12/30/20 0327 12/25/2020 0325  WBC 10.1 10.2 9.6 9.2 9.2  NEUTROABS 8.8*  --   --  7.8* 8.1*  HGB 6.5* 6.4* 8.4* 9.3*  9.4*  HCT 22.3* 22.0* 27.0* 28.1* 28.9*  MCV 99.6 100.9* 95.7 92.4 93.2  PLT 64* 63* 70* 71* 65*   Cardiac Enzymes: No results for input(s): CKTOTAL, CKMB, CKMBINDEX, TROPONINI in the last 168 hours. CBG: Recent Labs  Lab 12/30/20 1205 12/30/20 1720 12/30/20 2121 12/26/2020 0725 12/28/2020 1203  GLUCAP 106* 113* 114* 112* 143*    Iron Studies: No results for input(s): IRON, TIBC, TRANSFERRIN, FERRITIN in the last 72 hours. Studies/Results: NM GI Blood Loss  Result Date: 12/29/2020 CLINICAL DATA:  GI bleeding EXAM: NUCLEAR MEDICINE GASTROINTESTINAL BLEEDING SCAN TECHNIQUE: Sequential abdominal images were obtained following intravenous administration of Tc-67m labeled red blood cells. RADIOPHARMACEUTICALS:  mCi Tc-80m pertechnetate in-vitro labeled red cells. COMPARISON:  None. FINDINGS: Initial images and demonstrates activity in the cardiac blood pool is well as the liver and spleen. Some background activity is noted throughout the bowel which shows no significant interval change to suggest active GI hemorrhage. Imaging into the second hour shows no focal area of increased activity to suggest active significant GI hemorrhage. IMPRESSION: No definitive GI hemorrhage is identified. Electronically  Signed   By: Inez Catalina M.D.   On: 12/29/2020 19:11   DG Chest Port 1 View  Result Date: 12/13/2020 CLINICAL DATA:  Dyspnea EXAM: PORTABLE CHEST 1 VIEW COMPARISON:  12/03/2020 FINDINGS: Interval extubation. Nasogastric tube is been removed. Pulmonary insufflation has diminished slightly. There is interval development of bilateral perihilar and lower lung zone airspace infiltrate, likely infectious or inflammatory. No pneumothorax or pleural effusion. Right internal jugular hemodialysis catheter tip noted within the right atrium. Cardiac size within normal limits. The pulmonary vascularity is normal. Massive pneumoperitoneum is again identified below the hemidiaphragm. IMPRESSION: Interval extubation.   Diminished pulmonary insufflation. Interval development of bilateral mid and lower lung zone airspace infiltrate, likely infectious or inflammatory. Massive pneumoperitoneum again noted. Electronically Signed   By: Fidela Salisbury MD   On: 12/02/2020 09:05   DG Abd Portable 1V  Result Date: 12/16/2020 CLINICAL DATA:  Constipation EXAM: PORTABLE ABDOMEN - 1 VIEW COMPARISON:  12/29/2020 FINDINGS: The subdiaphragmatic region is excluded from view. Massive pneumoperitoneum is again identified. Laparotomy skin staples overlie the mid abdomen. Note that the degree of pneumoperitoneum is more than would be typically expected following laparotomy on 12/22/2020. Moderate stool is seen throughout the colon and within the rectal vault. There is loss of typical how Stroll markings involving the a descending and sigmoid colon with apparent wall thickening of the bowel in keeping with a long segment colitis within this region. IMPRESSION: Massive pneumoperitoneum again noted. Note that this typically more than would be expected given laparotomy on 12/05/2020 and an ongoing air leak secondary to visceral perforation should be considered. Repeat CT imaging with oral contrast may be helpful to identify the source of leak. Suspected long segment colitis involving the distal descending and sigmoid colon. Electronically Signed   By: Fidela Salisbury MD   On: 12/10/2020 09:09   US Abdomen Limited RUQ (LIVER/GB)  Result Date: 12/30/2020 CLINICAL DATA:  Transaminitis. EXAM: ULTRASOUND ABDOMEN LIMITED RIGHT UPPER QUADRANT COMPARISON:  None. FINDINGS: Gallbladder: Gallbladder is distended and filled with sludge. Borderline gallbladder wall thickening. No sonographic Murphy sign elicited. Common bile duct: Diameter: 4 mm Liver: No focal lesion identified. Diffusely echogenic indicating fatty infiltration. Portal vein is patent on color Doppler imaging with normal direction of blood flow towards the liver. Other: RIGHT upper quadrant  ascites.  RIGHT-sided pleural effusion. IMPRESSION: 1. Gallbladder is distended and filled with sludge. No convincing sonographic evidence of acute cholecystitis. 2. No bile duct dilatation. 3. Fatty infiltration of the liver. 4. Ascites. Electronically Signed   By: Franki Cabot M.D.   On: 12/30/2020 15:46   . amiodarone  200 mg Oral BID  . Chlorhexidine Gluconate Cloth  6 each Topical Q0600  . famotidine  20 mg Oral Daily  . fludrocortisone  0.2 mg Oral BID  . insulin aspart  0-15 Units Subcutaneous TID WC  . insulin aspart  0-5 Units Subcutaneous QHS  . metoprolol tartrate  12.5 mg Oral BID  . midodrine  10 mg Oral TID WC  . multivitamin  1 tablet Oral QHS  . mupirocin ointment   Nasal BID  . pantoprazole (PROTONIX) IV  40 mg Intravenous Q12H  . pyridostigmine  60 mg Oral Daily  . sodium bicarbonate  100 mEq Intravenous Once    BMET    Component Value Date/Time   NA 134 (L) 12/13/2020 0325   K 4.3 12/06/2020 0325   CL 100 12/14/2020 0325   CO2 16 (L) 12/21/2020 0325   GLUCOSE 122 (H)  12/15/2020 0325   BUN 79 (H) 12/24/2020 0325   CREATININE 6.18 (H) 12/29/2020 0325   CALCIUM 7.6 (L) 12/06/2020 0325   GFRNONAA 9 (L) 12/21/2020 0325   GFRAA 19 (L) 06/15/2018 0407   CBC    Component Value Date/Time   WBC 9.2 12/20/2020 0325   RBC 3.10 (L) 12/29/2020 0325   HGB 9.4 (L) 12/08/2020 0325   HCT 28.9 (L) 12/23/2020 0325   PLT 65 (L) 12/20/2020 0325   MCV 93.2 12/02/2020 0325   MCH 30.3 12/24/2020 0325   MCHC 32.5 12/30/2020 0325   RDW 18.6 (H) 12/25/2020 0325   LYMPHSABS 0.6 (L) 12/30/2020 0325   MONOABS 0.4 12/18/2020 0325   EOSABS 0.0 12/04/2020 0325   BASOSABS 0.0 12/25/2020 0325      OP HD: TTS [facility]   66.5kg dry wt per OP HD RN/ R IJ TDC   Assessment/Plan:  1. Pneumoperitoneum/ shock -sp exlap, no perforated viscous found. Abd films today with worsening pneumoperitoneum and plan for surgery today for possible colectomy 2. ESRD - on HD TTS.   Missed OP HD x 2. Had HD here 1/23 and 1/26 this week. HD bumped from Sat > Sun and now bumped to Monday. Prioritizing for HD tomorrow.  3. Covid positive - therapies per primary team 4. Orthostatic hypotension - chronic issue, pt is bed-bound it appears and does stretcher dialysis w/ the group in [facility]. On midodrine and fluorinef outpatient per charting.  5. BP /volume - has sig LE/ UE edema, up 1-2kg by wt's, no resp issues. Unable to UF large amts due to low alb/ 3rd spacing of fluid.  6. DM2 - per primary team  7. Anemia ckd - no emergent need for PRBC's. need records re: ESA dosing.  8. Hypocalcemia - repleted; added ca bath 9. Prognosis - very poor and has been seen by Palliative care in the past.    Donetta Potts, MD [facility] [phone]

## 2020-12-31 NOTE — Anesthesia Postprocedure Evaluation (Signed)
Anesthesia Post Note  Patient: [name]  Procedure(s) Performed: EXPLORATORY LAPAROTOMY (N/A Abdomen) LEFT COLECTOMY (N/A Abdomen) TRANSVERSE COLOSTOMY (N/A Abdomen)     Patient location during evaluation: SICU Anesthesia Type: General Level of consciousness: sedated Pain management: pain level controlled Vital Signs Assessment: post-procedure vital signs reviewed and stable Respiratory status: patient remains intubated per anesthesia plan Cardiovascular status: stable Postop Assessment: no apparent nausea or vomiting Anesthetic complications: no   No complications documented.  Last Vitals:  Vitals:   12/06/2020 1700 12/25/2020 1800  BP: 124/75   Pulse:    Resp: 18 18  Temp: (!) 35.3 C   SpO2:      Last Pain:  Vitals:   12/21/2020 1700  TempSrc: Rectal  PainSc:                  March Rummage Paislee Szatkowski

## 2020-12-31 NOTE — Progress Notes (Addendum)
NAME:  [name], MRN:  [id number], DOB:  1950/12/28, LOS: 46 ADMISSION DATE:  12/15/2020, CONSULTATION DATE: 12/07/2020 REFERRING MD:  Jesusita Oka, MD, CHIEF COMPLAINT: Acute hypoxic respiratory failure post laparotomy  Brief History:  70 year old male with end-stage renal disease on dialysis TTS, admitted with nausea, vomiting, and abdominal pain, noted to have pneumoperitoneum.  Underwent laparotomy 1/20 which was negative.  Incidentally found to be COVID-positive. On 1/30, his LFTs started to elevate and KUB showed worsening intraperitoneal air.  He was taken back to OR 1/31 for ex lap with end colostomy and left colectomy.  Returns to ICU on two vasopressors and remains intubated secondary to hemodynamic instability.   History of Present Illness:  70 year old male with diabetes, ESRD on HD, TTS, chronic diarrhea, chronic hypotension, PAF - systemic AC on hold w/hx of GIB, admitted 1/19 from SNF with N/V and abd pain found to have pneumoperitoneum.   Prior to this he had prolonged hospitalization 12/3- 1/10  at [facility] with diarrhea, dehydration/ hypotension, deconditioning being bed bound with sacral decub, then previously 11/12- 12/2 with syncope and colitis.   Underwent laparotomy 1/20 without findings of bowel perforation.  Incidentally COVID positive.  He was transferred to [facility] on 1/22.  He was doing better however his LFTs elevated on 1/30 and KUB 1/31 showed worsening intraperitoneal air.  Of note he has previously had multiple ulcers in the sigmoid colon of nonspecific etiology.  He was taken back to the OR today, 1/31 and underwent exploratory laparotomy with end colostomy and left colectomy- noting that entire left colon was boggy thought secondary to ischemia; also noted to have ulcerated area on anterior rectum.  Post operative, he returns to ICU on two vasopressors and remains intubated due to hemodynamic instability. EBL 50 ml.  PCCM re-consulted for ICU management.   Past Medical  History:  End-stage renal disease on dialysis Diabetes type 2 Paroxysmal A. fib Orthostatic hypotension Unstageable sacral decubitus ulcer (POA)  Significant Hospital Events:  1/19>> Admit to [facility] for abdominal pain-pneumoperitoneum on imaging studies- 1/20 exploratory laparotomy negative/ extubated  1/21 iHD, low dose phenyl needed, weaned off after iHD stopped, started on stress dose steroids - got 1 dose and was stopped, resumed home midodrine 1/22>> transferred to [facility] service. 1/26>> plans to discharge back to SNF-however upon further evaluation-needed inpatient hydrotherapy for worsening sacral wound. 1/27>> general surgery consulted for sacral wound-continue hydrotherapy for few more days.  Consults:  [facility] Nephrology   Procedures:  1/10 right subclavian DL TDC >>  1/20 ex lap >> neg 1/31 ex lap >> end colostomy/ left colectomy   ETT 1/20; 1/31 >> Left radial aline 1/31 >>  Significant Diagnostic Tests:  1/20 CT abdomen and pelvis: There is a focal area of wall thickening with adjacent perforation of the distal descending colon at the junction of the sigmoid colon, likely the transition point. 2. There is moderately dilated sigmoid colon and rectum which is stool-filled and suggestion of possible pneumatosis. 3. Free air seen under the right hemidiaphragm and within the porta hepatis. 4. Small amount of air seen within the inferior right liver lobe which is concerning for portal venous gas given the patient's findings 5. Bilateral sacral decubitus ulceration with phlegmon and subcutaneous emphysema extending to the inferior coccyx.  1/31 KUB >> Massive pneumoperitoneum again noted. Note that this typically more than would be expected given laparotomy on 12/25/2020 and an ongoing air leak secondary to visceral perforation should be considered.  Repeat CT imaging  with oral contrast may be helpful to identify the source of leak.  Suspected long segment colitis involving the  distal descending and sigmoid colon.  Micro Data:  1/20 influenza PCR negative 1/20 COVID PCR positive 1/21 MRSA PCR >> positive  1/31 BCx2 >>  Antimicrobials:  Zosyn 1/19 > 1/24; 1/31 >>  Interim History / Subjective:  Returns to ICU intubated on MV,  on Neo 30 mcg/min, NE 5 mcg/min and propofol.   Objective   Blood pressure 114/69, pulse 71, temperature 98.8 F (37.1 C), temperature source Oral, resp. rate 18, height 6\' 2"  (1.88 m), weight 68.3 kg, SpO2 92 %.        Intake/Output Summary (Last 24 hours) at 12/30/2020 1505 Last data filed at 12/10/2020 1414 Gross per 24 hour  Intake 800 ml  Output 1000 ml  Net -200 ml   Filed Weights   12/26/20 1612 12/27/20 0237 12/25/2020 0405  Weight: 67.4 kg 68 kg 68.3 kg    Examination:   Physical exam: General:  Thin, chronically ill appearing elderly male sedated on MV HEENT: MM pink/moist, ETT/ OGT, pupils 3/sluggish, anicteric  Neuro: sedated CV: rr, SB- 53, no murmur PULM:  Non labored, MV supported breaths GI: midline abd incision with dry dressing, right JP, left stoma pink, foley - minimal dark urine output Extremities: dry/ cool, mild upper extremity dependent edema Skin: no rashes, left great toe with end dry ischemia, several dry ulcerations to toes, multiple pressure dressings and sacral wound.   +746 ml/ net + 2.4L  Resolved Hospital Problem list   Large bowel obstruction status post exploratory laparotomy Acute metabolic acidosis COVID 19 infection- off isolation   Assessment & Plan:  Distal colonic micro perforation with pneumoperitoneum - s/p end colostomy and left colectomy 1/3, evidence of ischemic left colon - per CCS - will remain NPO- see below, pharmacy consulted for TPN, will start 2/1 - trend LFTs/ coags  - follow surgical pathology   Hypotension, hx orthostatic: multifactorial- poor nutrition, chronic hypotension, AI, sedation, rule out intra-abdominal infection given microperforation/  pneumoperitoneum - continue to wean Neo/ NE for MAP goal > 65 - will place central line as TDC is currently assessed for pressors, continue Aline  - assess/ trend lactic - hold midodrine/ florinef given NPO, restart solucortef  - check H/H now / BMET now - rule out sepsis component- no spike in fever/ WBC, will send for [facility] and start zosyn per pharmacy for 5 days  - nutrition- TPN as below   Acute hypoxic respiratory insufficiency post-operatively, previously due to volume overload from missing HD session - full MV support - CXR / ABG now and intermittent  - VAP/ PPI  - daily SBT/ WUA - PAD protocol -> fentanyl prn for pain, reduce propofol- may need to change to versed given hypotension, ideally would do precedex but HR is 53.   ESRD on TTS (new to HD since 10/2020) - per nephrology, might need CRRT if remains on vasopressor support.  Last iHD 1/23 and 1/26 - trend renal indices  - continue foley for now, still has UOP  Diabetes type 2: - SSI sensitive   PAF w/RVR - currently in NSR - hold PO amiodarone, may need to switch to IV - hold betablocker (while on pressors) - no systemic AC given prior GIB  Sacral wound- un-stageable, present on admission - per WOC recommendations - maximize nutrition   Anemia- multifactorial, of chronic illness, ABLA - transfused 1 unit 1/29 and 1/30 secondary to  BRPR - remains on PPI BID - recheck H/H now, trend CBC - transfuse for Hgb < 7  Thrombocytopenia  - stable, trend   Chronic debility/ deconditioning -  mostly bed bound now, PT/ OT when able  Protein calorie malnutrition - TPN per pharmacy   Best practice (evaluated daily)  Diet: NPO, pharmacy consulted for TPN Pain/Anxiety/Delirium protocol (if indicated): minimize- fentanyl/ low dose propofol  VAP protocol (if indicated): yes DVT prophylaxis: SCDs only  GI prophylaxis: PPI  Glucose control: SSI sensitive Mobility: PT/OT Disposition: ICU  Goals of Care:   Code Status:  Full code  Labs   CBC: Recent Labs  Lab 12/29/20 0446 12/29/20 0728 12/29/20 2114 12/30/20 0327 12/03/2020 0325  WBC 10.1 10.2 9.6 9.2 9.2  NEUTROABS 8.8*  --   --  7.8* 8.1*  HGB 6.5* 6.4* 8.4* 9.3* 9.4*  HCT 22.3* 22.0* 27.0* 28.1* 28.9*  MCV 99.6 100.9* 95.7 92.4 93.2  PLT 64* 63* 70* 71* 65*    Basic Metabolic Panel: Recent Labs  Lab 12/26/20 0146 12/27/20 0214 12/29/20 0446 12/30/20 0327 12/28/2020 0325  NA 135 137 132* 134* 134*  K 3.9 3.7 4.4 4.3 4.3  CL 103 102 100 101 100  CO2 16* 21* 19* 19* 16*  GLUCOSE 116* 153* 115* 122* 122*  BUN 59* 36* 55* 66* 79*  CREATININE 4.54* 3.43* 5.05* 5.42* 6.18*  CALCIUM 7.0* 7.3* 7.5* 7.6* 7.6*  MG 1.6*  --   --  2.1 2.0  PHOS  --   --  4.9*  --   --    GFR: Estimated Creatinine Clearance: 10.7 mL/min (A) (by C-G formula based on SCr of 6.18 mg/dL (H)). Recent Labs  Lab 12/29/20 0446 12/29/20 0728 12/29/20 2114 12/30/20 0327 12/14/2020 0325  WBC 10.1 10.2 9.6 9.2 9.2  LATICACIDVEN 2.9*  --   --   --   --     Liver Function Tests: Recent Labs  Lab 12/25/20 0039 12/26/20 0146 12/27/20 0214 12/29/20 0446 12/30/20 0327 12/05/2020 0325  AST 19 27 30   --  239* 200*  ALT 11 13 14   --  175* 161*  ALKPHOS 112 125 127*  --  167* 167*  BILITOT 0.9 0.7 1.1  --  1.3* 0.8  PROT 4.7* 4.6* 4.8*  --  4.6* 4.7*  ALBUMIN 1.7* 1.5* 1.8* 1.6* 1.7* 1.6*   No results for input(s): LIPASE, AMYLASE in the last 168 hours. No results for input(s): AMMONIA in the last 168 hours.  ABG    Component Value Date/Time   PHART 7.261 (L) 12/22/2020 0905   PCO2ART 30.1 (L) 12/08/2020 0905   PO2ART 149 (H) 12/05/2020 0905   HCO3 13.3 (L) 12/09/2020 0905   TCO2 13 (L) 12/01/2020 0648   ACIDBASEDEF 12.6 (H) 12/03/2020 0905   O2SAT 97.0 12/14/2020 0905     Coagulation Profile: Recent Labs  Lab 12/31/20 0325  INR 1.1    Cardiac Enzymes: No results for input(s): CKTOTAL, CKMB, CKMBINDEX, TROPONINI in the last 168 hours.  HbA1C: Hgb  A1c MFr Bld  Date/Time Value Ref Range Status  12/21/2020 12:08 PM 4.1 (L) 4.8 - 5.6 % Final    Comment:    (NOTE) Pre diabetes:          5.7%-6.4%  Diabetes:              >6.4%  Glycemic control for   <7.0% adults with diabetes   06/13/2018 03:16 AM 6.6 (H) 4.8 - 5.6 % Final  Comment:    (NOTE) Pre diabetes:          5.7%-6.4% Diabetes:              >6.4% Glycemic control for   <7.0% adults with diabetes     CBG: Recent Labs  Lab 12/30/20 1205 12/30/20 1720 12/30/20 2121 12/01/2020 0725 12/14/2020 1203  GLUCAP 106* 113* 114* 112* 143*    Critical Care Time:  Total critical care time: 50 minutes   Kennieth Rad, ACNP [facility] 12/21/2020, 3:05 PM

## 2020-12-31 NOTE — Progress Notes (Signed)
eLink Physician-Brief Progress Note Patient Name: [name] DOB: Sep 19, 1951 MRN: [id number]   Date of Service  12/19/2020  HPI/Events of Note  Hypotension - Patient has a Norepinephrine IV infusion running. No order for Norepinephrine IV infusion.   eICU Interventions  Plan: 1. Norepinephrine IV infusion. Titrate to MAP >= 65.      Intervention Category Major Interventions: Hypotension - evaluation and management  Lysle Dingwall 12/21/2020, 8:34 PM

## 2020-12-31 NOTE — Anesthesia Preprocedure Evaluation (Addendum)
Anesthesia Evaluation  Patient identified by MRN, date of birth, ID band Patient awake    Reviewed: Allergy & Precautions, Patient's Chart, lab work & pertinent test results  Airway Mallampati: II  TM Distance: >3 FB Neck ROM: Full    Dental  (+) Teeth Intact   Pulmonary neg pulmonary ROS,  COVID+   Pulmonary exam normal        Cardiovascular + dysrhythmias Atrial Fibrillation  Rhythm:Irregular Rate:Normal     Neuro/Psych negative neurological ROS  negative psych ROS   GI/Hepatic Neg liver ROS, Large bowel obstruction s/p ex lap 12/12/2020 now with free air here for repeat ex lap.   Endo/Other  diabetes, Type 2  Renal/GU ESRF and DialysisRenal disease  negative genitourinary   Musculoskeletal negative musculoskeletal ROS (+)   Abdominal (+)  Abdomen: soft. Bowel sounds: normal.  Peds  Hematology  (+) anemia ,   Anesthesia Other Findings   Reproductive/Obstetrics                             Anesthesia Physical  Anesthesia Plan  ASA: III and emergent  Anesthesia Plan: General   Post-op Pain Management:    Induction: Intravenous and Rapid sequence  PONV Risk Score and Plan: 2 and Ondansetron, Dexamethasone and Treatment may vary due to age or medical condition  Airway Management Planned: Mask and Oral ETT  Additional Equipment: Arterial line  Intra-op Plan:   Post-operative Plan: Possible Post-op intubation/ventilation  Informed Consent: I have reviewed the patients History and Physical, chart, labs and discussed the procedure including the risks, benefits and alternatives for the proposed anesthesia with the patient or authorized representative who has indicated his/her understanding and acceptance.     Dental advisory given  Plan Discussed with: CRNA  Anesthesia Plan Comments: (Lab Results      Component                Value               Date                      WBC                       9.2                 12/30/2020                HGB                      9.4 (L)             12/07/2020                HCT                      28.9 (L)            12/01/2020                MCV                      93.2                12/12/2020                PLT  65 (L)              12/10/2020           Lab Results      Component                Value               Date                      NA                       134 (L)             12/04/2020                K                        4.3                 12/10/2020                CO2                      16 (L)              12/12/2020                GLUCOSE                  122 (H)             12/26/2020                BUN                      79 (H)              12/14/2020                CREATININE               6.18 (H)            12/22/2020                CALCIUM                  7.6 (L)             12/17/2020                GFRNONAA                 9 (L)               12/03/2020                GFRAA                    19 (L)              06/15/2018            ECHO 11/21: 1. Left ventricular ejection fraction, by estimation, is 60 to 65%. The  left ventricle has normal function. The left ventricle has no regional  wall motion abnormalities. Left ventricular diastolic parameters are  consistent with Grade I diastolic  dysfunction (impaired relaxation). Elevated left atrial pressure.  2. Right ventricular systolic function is normal. The right ventricular  size  is normal. There is normal pulmonary artery systolic pressure.  3. The mitral valve is normal in structure. No evidence of mitral valve  regurgitation. No evidence of mitral stenosis.  4. The aortic valve is normal in structure. Aortic valve regurgitation is  not visualized. No aortic stenosis is present.  5. There is Moderate (Grade III) protruding plaque involving the  transverse aorta.  6. The inferior vena cava is normal in size  with greater than 50%  respiratory variability, suggesting right atrial pressure of 3 mmHg. )       Anesthesia Quick Evaluation

## 2021-01-01 ENCOUNTER — Inpatient Hospital Stay (HOSPITAL_COMMUNITY): Payer: Medicare Other

## 2021-01-01 ENCOUNTER — Encounter (HOSPITAL_COMMUNITY): Payer: Self-pay | Admitting: General Surgery

## 2021-01-01 DIAGNOSIS — R6521 Severe sepsis with septic shock: Secondary | ICD-10-CM

## 2021-01-01 DIAGNOSIS — K631 Perforation of intestine (nontraumatic): Secondary | ICD-10-CM | POA: Diagnosis not present

## 2021-01-01 DIAGNOSIS — J9601 Acute respiratory failure with hypoxia: Secondary | ICD-10-CM | POA: Diagnosis not present

## 2021-01-01 DIAGNOSIS — A419 Sepsis, unspecified organism: Secondary | ICD-10-CM

## 2021-01-01 DIAGNOSIS — E46 Unspecified protein-calorie malnutrition: Secondary | ICD-10-CM

## 2021-01-01 DIAGNOSIS — K668 Other specified disorders of peritoneum: Secondary | ICD-10-CM | POA: Diagnosis not present

## 2021-01-01 LAB — GLUCOSE, CAPILLARY
Glucose-Capillary: 132 mg/dL — ABNORMAL HIGH (ref 70–99)
Glucose-Capillary: 131 mg/dL — ABNORMAL HIGH (ref 70–99)
Glucose-Capillary: 167 mg/dL — ABNORMAL HIGH (ref 70–99)
Glucose-Capillary: 154 mg/dL — ABNORMAL HIGH (ref 70–99)
Glucose-Capillary: 148 mg/dL — ABNORMAL HIGH (ref 70–99)

## 2021-01-01 LAB — CBC WITH DIFFERENTIAL/PLATELET
Abs Immature Granulocytes: 0.05 10*3/uL (ref 0.00–0.07)
Basophils Absolute: 0 10*3/uL (ref 0.0–0.1)
Basophils Relative: 0 %
Eosinophils Absolute: 0 10*3/uL (ref 0.0–0.5)
Eosinophils Relative: 0 %
HCT: 23.6 % — ABNORMAL LOW (ref 39.0–52.0)
Hemoglobin: 7.8 g/dL — ABNORMAL LOW (ref 13.0–17.0)
Immature Granulocytes: 1 %
Lymphocytes Relative: 4 %
Lymphs Abs: 0.4 10*3/uL — ABNORMAL LOW (ref 0.7–4.0)
MCH: 30.5 pg (ref 26.0–34.0)
MCHC: 33.1 g/dL (ref 30.0–36.0)
MCV: 92.2 fL (ref 80.0–100.0)
Monocytes Absolute: 0.3 10*3/uL (ref 0.1–1.0)
Monocytes Relative: 3 %
Neutro Abs: 9.5 10*3/uL — ABNORMAL HIGH (ref 1.7–7.7)
Neutrophils Relative %: 92 %
Platelets: 44 10*3/uL — ABNORMAL LOW (ref 150–400)
RBC: 2.56 MIL/uL — ABNORMAL LOW (ref 4.22–5.81)
RDW: 17.9 % — ABNORMAL HIGH (ref 11.5–15.5)
WBC: 10.3 10*3/uL (ref 4.0–10.5)
nRBC: 0 % (ref 0.0–0.2)

## 2021-01-01 LAB — POCT I-STAT 7, (LYTES, BLD GAS, ICA,H+H)
Acid-base deficit: 6 mmol/L — ABNORMAL HIGH (ref 0.0–2.0)
Acid-base deficit: 9 mmol/L — ABNORMAL HIGH (ref 0.0–2.0)
Bicarbonate: 18 mmol/L — ABNORMAL LOW (ref 20.0–28.0)
Bicarbonate: 19.5 mmol/L — ABNORMAL LOW (ref 20.0–28.0)
Calcium, Ion: 1.03 mmol/L — ABNORMAL LOW (ref 1.15–1.40)
Calcium, Ion: 1.09 mmol/L — ABNORMAL LOW (ref 1.15–1.40)
HCT: 25 % — ABNORMAL LOW (ref 39.0–52.0)
HCT: 25 % — ABNORMAL LOW (ref 39.0–52.0)
Hemoglobin: 8.5 g/dL — ABNORMAL LOW (ref 13.0–17.0)
Hemoglobin: 8.5 g/dL — ABNORMAL LOW (ref 13.0–17.0)
O2 Saturation: 90 %
O2 Saturation: 98 %
Patient temperature: 35.7
Potassium: 4.1 mmol/L (ref 3.5–5.1)
Potassium: 4.2 mmol/L (ref 3.5–5.1)
Sodium: 134 mmol/L — ABNORMAL LOW (ref 135–145)
Sodium: 135 mmol/L (ref 135–145)
TCO2: 19 mmol/L — ABNORMAL LOW (ref 22–32)
TCO2: 21 mmol/L — ABNORMAL LOW (ref 22–32)
pCO2 arterial: 38.8 mmHg (ref 32.0–48.0)
pCO2 arterial: 39.8 mmHg (ref 32.0–48.0)
pH, Arterial: 7.258 — ABNORMAL LOW (ref 7.350–7.450)
pH, Arterial: 7.309 — ABNORMAL LOW (ref 7.350–7.450)
pO2, Arterial: 116 mmHg — ABNORMAL HIGH (ref 83.0–108.0)
pO2, Arterial: 63 mmHg — ABNORMAL LOW (ref 83.0–108.0)

## 2021-01-01 LAB — COMPREHENSIVE METABOLIC PANEL
ALT: 108 U/L — ABNORMAL HIGH (ref 0–44)
AST: 103 U/L — ABNORMAL HIGH (ref 15–41)
Albumin: 1.7 g/dL — ABNORMAL LOW (ref 3.5–5.0)
Alkaline Phosphatase: 107 U/L (ref 38–126)
Anion gap: 19 — ABNORMAL HIGH (ref 5–15)
BUN: 88 mg/dL — ABNORMAL HIGH (ref 8–23)
CO2: 14 mmol/L — ABNORMAL LOW (ref 22–32)
Calcium: 7.3 mg/dL — ABNORMAL LOW (ref 8.9–10.3)
Chloride: 100 mmol/L (ref 98–111)
Creatinine, Ser: 6.06 mg/dL — ABNORMAL HIGH (ref 0.61–1.24)
GFR, Estimated: 9 mL/min — ABNORMAL LOW (ref >60)
Glucose, Bld: 131 mg/dL — ABNORMAL HIGH (ref 70–99)
Potassium: 4.7 mmol/L (ref 3.5–5.1)
Sodium: 133 mmol/L — ABNORMAL LOW (ref 135–145)
Total Bilirubin: 1.3 mg/dL — ABNORMAL HIGH (ref 0.3–1.2)
Total Protein: 4.5 g/dL — ABNORMAL LOW (ref 6.5–8.1)

## 2021-01-01 LAB — TRIGLYCERIDES: Triglycerides: 121 mg/dL (ref <150)

## 2021-01-01 LAB — PROTIME-INR
INR: 1.2 (ref 0.8–1.2)
Prothrombin Time: 14.9 seconds (ref 11.4–15.2)

## 2021-01-01 LAB — DIC (DISSEMINATED INTRAVASCULAR COAGULATION)PANEL
D-Dimer, Quant: 6.8 ug/mL-FEU — ABNORMAL HIGH (ref 0.00–0.50)
Fibrinogen: 289 mg/dL (ref 210–475)
INR: 1.1 (ref 0.8–1.2)
Platelets: 50 10*3/uL — ABNORMAL LOW (ref 150–400)
Prothrombin Time: 14.1 seconds (ref 11.4–15.2)
Smear Review: NONE SEEN
aPTT: 36 seconds (ref 24–36)

## 2021-01-01 LAB — PREALBUMIN: Prealbumin: <5 mg/dL — ABNORMAL LOW (ref 18–38)

## 2021-01-01 LAB — PHOSPHORUS: Phosphorus: 6.1 mg/dL — ABNORMAL HIGH (ref 2.5–4.6)

## 2021-01-01 LAB — MAGNESIUM: Magnesium: 1.9 mg/dL (ref 1.7–2.4)

## 2021-01-01 MED ORDER — MIDAZOLAM HCL 2 MG/2ML IJ SOLN
1.0000 mg | INTRAMUSCULAR | Status: DC | PRN
  Administered 2021-01-03: 1 mg via INTRAVENOUS
  Filled 2021-01-01: qty 2

## 2021-01-01 MED ORDER — FENTANYL 2500MCG IN NS 250ML (10MCG/ML) PREMIX INFUSION
25.0000 ug/h | INTRAVENOUS | Status: DC
  Administered 2021-01-01: 25 ug/h via INTRAVENOUS
  Administered 2021-01-03: 50 ug/h via INTRAVENOUS
  Filled 2021-01-01 (×2): qty 250

## 2021-01-01 MED ORDER — FENTANYL BOLUS VIA INFUSION
25.0000 ug | INTRAVENOUS | Status: DC | PRN
  Administered 2021-01-01 – 2021-01-03 (×7): 25 ug via INTRAVENOUS
  Filled 2021-01-01: qty 25

## 2021-01-01 MED ORDER — HEPARIN SODIUM (PORCINE) 1000 UNIT/ML DIALYSIS: 1500.0000 [IU] | INTRAMUSCULAR | Status: DC | PRN

## 2021-01-01 MED ORDER — FENTANYL CITRATE (PF) 100 MCG/2ML IJ SOLN: 25.0000 ug | Freq: Once | INTRAMUSCULAR | Status: AC

## 2021-01-01 MED ORDER — HEPARIN SODIUM (PORCINE) 1000 UNIT/ML IJ SOLN
INTRAMUSCULAR | Status: AC
  Filled 2021-01-01: qty 4

## 2021-01-01 MED ORDER — TRACE MINERALS CU-MN-SE-ZN 300-55-60-3000 MCG/ML IV SOLN
INTRAVENOUS | Status: AC
  Filled 2021-01-01: qty 444

## 2021-01-01 NOTE — Progress Notes (Signed)
1 Day Post-Op   Subjective/Chief Complaint: On vent, opens eyes   Objective: Vital signs in last 24 hours: Temp:  [95.1 F (35.1 C)-98.5 F (36.9 C)] 95.1 F (35.1 C) (02/01 0400) Pulse Rate:  [44-156] 49 (02/01 0530) Resp:  [0-21] 18 (02/01 0530) BP: (84-161)/(59-92) 144/92 (02/01 0515) SpO2:  [99 %-100 %] 100 % (02/01 0530) Arterial Line BP: (90-184)/(46-75) 141/67 (02/01 0530) FiO2 (%):  [40 %-100 %] 40 % (02/01 0318) Last BM Date: 12/25/2020  Intake/Output from previous day: 01/31 0701 - 02/01 0700 In: 857.9 [P.O.:120; I.V.:137.8; IV Piggyback:600] Out: 6073 [Urine:900; Drains:185; Blood:100] Intake/Output this shift: No intake/output data recorded.  GI: soft stoma with edema viable drain with serous output wound open and clean  Lab Results:  Recent Labs    12/09/2020 1724 12/14/2020 1751 01/01/21 0254  WBC 8.4  --  10.3  HGB 8.5* 8.8* 7.8*  HCT 25.9* 26.0* 23.6*  PLT 55*  51*  --  44*   BMET Recent Labs    12/12/2020 1724 12/03/2020 1751 01/01/21 0254  NA 132* 132* 133*  K 4.2 4.2 4.7  CL 100  --  100  CO2 16*  --  14*  GLUCOSE 167*  --  131*  BUN 85*  --  88*  CREATININE 6.14*  --  6.06*  CALCIUM 7.5*  --  7.3*   PT/INR Recent Labs    12/09/2020 1724 01/01/21 0254  LABPROT 14.7 14.9  INR 1.2 1.2   ABG Recent Labs    12/11/2020 1751  PHART 7.420  HCO3 18.8*    Studies/Results: DG Chest Port 1 View  Result Date: 01/01/2021 CLINICAL DATA:  Respiratory failure EXAM: PORTABLE CHEST 1 VIEW.  Patient is rotated. COMPARISON:  Chest x-ray 12/30/2020 FINDINGS: Endotracheal tube with tip approximately 8.5 cm above the carina. Enteric tube coursing below the hemidiaphragm with tip overlying the gastric lumen and side port overlying the expected region of the distal esophagus. Right dialysis catheter with tip overlying the right atrium. The heart size and mediastinal contours are within normal limits. Interval development of streaky airspace opacity within the  right middle lobe. Suggestion of vague patchy airspace opacities within bilateral lungs. No pulmonary edema. No pleural effusion. No pneumothorax. No acute osseous abnormality. Persistent, likely large volume, pneumoperitoneum. IMPRESSION: 1. Endotracheal tube with tip approximately 8.5 cm above the carina. Recommend advancing by 1-2 cm. 2. Enteric tube coursing below the hemidiaphragm with tip overlying the gastric lumen and side port overlying the expected region of the distal esophagus. Recommend advancing by 5 cm. 3. Interval development of streaky airspace opacity within the right middle lobe. 4. Persistent, likely large volume, pneumoperitoneum. Electronically Signed   By: Iven Finn M.D.   On: 01/01/2021 05:12   DG Chest Port 1 View  Result Date: 12/07/2020 CLINICAL DATA:  ETT placement EXAM: PORTABLE CHEST 1 VIEW COMPARISON:  12/22/2020 at 0842 hours FINDINGS: Endotracheal tube terminates 7.5 cm above the carina. Enteric tube courses into the stomach, with its side port at the GE junction. Right I did dual lumen dialysis catheter terminates in the upper right atrium. Mild patchy opacities in the central left upper lobe and bilateral lower lobes, suggesting multifocal pneumonia or aspiration. No definite pleural effusions. No pneumothorax. The heart is normal in size. IMPRESSION: Endotracheal tube terminates 7.5 cm above the carina. Additional support apparatus as above. Suspected multifocal pneumonia or aspiration. Electronically Signed   By: Julian Hy M.D.   On: 12/22/2020 15:59   DG Chest  Port 1 View  Result Date: 12/23/2020 CLINICAL DATA:  Dyspnea EXAM: PORTABLE CHEST 1 VIEW COMPARISON:  12/08/2020 FINDINGS: Interval extubation. Nasogastric tube is been removed. Pulmonary insufflation has diminished slightly. There is interval development of bilateral perihilar and lower lung zone airspace infiltrate, likely infectious or inflammatory. No pneumothorax or pleural effusion. Right  internal jugular hemodialysis catheter tip noted within the right atrium. Cardiac size within normal limits. The pulmonary vascularity is normal. Massive pneumoperitoneum is again identified below the hemidiaphragm. IMPRESSION: Interval extubation.  Diminished pulmonary insufflation. Interval development of bilateral mid and lower lung zone airspace infiltrate, likely infectious or inflammatory. Massive pneumoperitoneum again noted. Electronically Signed   By: Fidela Salisbury MD   On: 12/07/2020 09:05   DG Abd Portable 1V  Result Date: 12/21/2020 CLINICAL DATA:  Constipation EXAM: PORTABLE ABDOMEN - 1 VIEW COMPARISON:  12/29/2020 FINDINGS: The subdiaphragmatic region is excluded from view. Massive pneumoperitoneum is again identified. Laparotomy skin staples overlie the mid abdomen. Note that the degree of pneumoperitoneum is more than would be typically expected following laparotomy on 12/29/2020. Moderate stool is seen throughout the colon and within the rectal vault. There is loss of typical how Stroll markings involving the a descending and sigmoid colon with apparent wall thickening of the bowel in keeping with a long segment colitis within this region. IMPRESSION: Massive pneumoperitoneum again noted. Note that this typically more than would be expected given laparotomy on 12/09/2020 and an ongoing air leak secondary to visceral perforation should be considered. Repeat CT imaging with oral contrast may be helpful to identify the source of leak. Suspected long segment colitis involving the distal descending and sigmoid colon. Electronically Signed   By: Fidela Salisbury MD   On: 12/15/2020 09:09   US Abdomen Limited RUQ (LIVER/GB)  Result Date: 12/30/2020 CLINICAL DATA:  Transaminitis. EXAM: ULTRASOUND ABDOMEN LIMITED RIGHT UPPER QUADRANT COMPARISON:  None. FINDINGS: Gallbladder: Gallbladder is distended and filled with sludge. Borderline gallbladder wall thickening. No sonographic Murphy sign elicited.  Common bile duct: Diameter: 4 mm Liver: No focal lesion identified. Diffusely echogenic indicating fatty infiltration. Portal vein is patent on color Doppler imaging with normal direction of blood flow towards the liver. Other: RIGHT upper quadrant ascites.  RIGHT-sided pleural effusion. IMPRESSION: 1. Gallbladder is distended and filled with sludge. No convincing sonographic evidence of acute cholecystitis. 2. No bile duct dilatation. 3. Fatty infiltration of the liver. 4. Ascites. Electronically Signed   By: Franki Cabot M.D.   On: 12/30/2020 15:46    Anti-infectives: Anti-infectives (From admission, onward)   Start     Dose/Rate Route Frequency Ordered Stop   12/03/2020 1600  piperacillin-tazobactam (ZOSYN) IVPB 2.25 g        2.25 g 100 mL/hr over 30 Minutes Intravenous Every 8 hours 12/24/2020 1507     12/16/2020 1315  piperacillin-tazobactam (ZOSYN) IVPB 3.375 g        3.375 g 12.5 mL/hr over 240 Minutes Intravenous To Surgery 12/19/2020 1301 01/01/21 1315   12/12/2020 1500  piperacillin-tazobactam (ZOSYN) IVPB 3.375 g        3.375 g 12.5 mL/hr over 240 Minutes Intravenous Every 12 hours 12/19/2020 1219 12/24/20 2001   12/10/2020 1200  piperacillin-tazobactam (ZOSYN) IVPB 2.25 g  Status:  Discontinued        2.25 g 100 mL/hr over 30 Minutes Intravenous Every 8 hours 12/24/2020 0205 12/12/2020 1219   12/19/2020 0215  piperacillin-tazobactam (ZOSYN) IVPB 3.375 g        3.375 g 100 mL/hr over  30 Minutes Intravenous  Once 12/29/2020 0205 12/10/2020 0435      Assessment/Plan: POD 1 left colectomy/colostomy -continue ng tube today -recommend tpn -bid dressing changes -would do five days abx for intraoperative findings -I think this is related more to ischemia, do not think related to constipation  Chronic stage IV sacral decubitus pressure ulcer - no indication for emergent surgical debridement but patient would benefit from continuing hydrotherapy for a few more days. I don't think he any further  imaging    Rolm Bookbinder 01/01/2021

## 2021-01-01 NOTE — Progress Notes (Signed)
Physical Therapy Wound Treatment Patient Details  Name: [name] MRN: [id number] Date of Birth: [birth year]  Today's Date: 01/01/2021 Time: 9470-9628 Time Calculation (min): 39 min  Subjective  Subjective: Pt on vent Patient and Family Stated Goals: heal wound Date of Onset:  (unknown - pt reports at least a month) Prior Treatments: unknown  Pain Score:  (premedicated)  Wound Assessment  Pressure Injury 12/05/20 Coccyx Medial;Upper Unstageable - Full thickness tissue loss in which the base of the injury is covered by slough (yellow, tan, gray, green or brown) and/or eschar (tan, brown or black) in the wound bed. prevous stage 2 on sacrum (Active)  Dressing Type ABD;Barrier Film (skin prep);Gauze (Comment);Moist to moist 01/01/21 1359  Dressing Changed;Clean;Dry;Intact 01/01/21 1359  Dressing Change Frequency Twice a day 01/01/21 1359  State of Healing Early/partial granulation 01/01/21 1359  Site / Wound Assessment Red;Yellow;Black;Bleeding 01/01/21 1359  % Wound base Red or Granulating 30% 12/07/2020 1255  % Wound base Yellow/Fibrinous Exudate 50% 12/28/2020 1255  % Wound base Black/Eschar 20% 12/03/2020 1255  % Wound base Other/Granulation Tissue (Comment) 0% 12/07/2020 1255  Peri-wound Assessment Erythema (non-blanchable) 01/01/21 1359  Wound Length (cm) 10 cm 12/26/20 1253  Wound Width (cm) 6 cm 12/26/20 1253  Wound Depth (cm) 1 cm 12/26/20 1253  Wound Surface Area (cm^2) 60 cm^2 12/26/20 1253  Wound Volume (cm^3) 60 cm^3 12/26/20 1253  Tunneling (cm) 5 cm at 8 o clock 12/26/20 1253  Undermining (cm) 1-2 cm at 12-5 and 4-5 cm at 7-11 12/28/20 1246  Margins Unattached edges (unapproximated) 01/01/21 1359  Drainage Amount Moderate 01/01/21 1359  Drainage Description Serosanguineous;Purulent;Odor 01/01/21 1359  Treatment Debridement (Selective);Hydrotherapy (Pulse lavage);Packing (Saline gauze) 01/01/21 1359   Santyl applied to wound bed prior to applying dressing.     Hydrotherapy Pulsed lavage therapy - wound location: sacrum Pulsed Lavage with Suction (psi): 12 psi (4-12) Pulsed Lavage with Suction - Normal Saline Used: 1000 mL Pulsed Lavage Tip: Tip with splash shield Selective Debridement Selective Debridement - Location: sacrum Selective Debridement - Tools Used: Scalpel;Scissors;Forceps Selective Debridement - Tissue Removed: eschar, brown slough, black necrotic edges   Wound Assessment and Plan  Wound Therapy - Assess/Plan/Recommendations Wound Therapy - Clinical Statement: Wound continues to have foul odor, increased drainage and purulence. Will continue hydro and selective debridement to both clean up necrosis and decrease the bioburden. Wound Therapy - Functional Problem List: global weakness and immobility Factors Delaying/Impairing Wound Healing: Immobility;Multiple medical problems Hydrotherapy Plan: Debridement;Dressing change;Patient/family education;Pulsatile lavage with suction Wound Therapy - Frequency: 6X / week Wound Therapy - Follow Up Recommendations: Skilled nursing facility Wound Plan: [name] d/c'd, but discharged held to continue debridement and management of notable infection.  Wound Therapy Goals- Improve the function of patient's integumentary system by progressing the wound(s) through the phases of wound healing (inflammation - proliferation - remodeling) by: Decrease Necrotic Tissue to: 75 Decrease Necrotic Tissue - Progress: Progressing toward goal Increase Granulation Tissue to: 25 Goals/treatment plan/discharge plan were made with and agreed upon by patient/family: Yes Time For Goal Achievement: 7 days Wound Therapy - Potential for Goals: Good  Goals will be updated until maximal potential achieved or discharge criteria met.  Discharge criteria: when goals achieved, discharge from hospital, MD decision/surgical intervention, no progress towards goals, refusal/missing three consecutive treatments without notification  or medical reason.  GP  Wyona Almas, PT, DPT Acute Rehabilitation Services Pager [phone] Office [phone]      [name] 01/01/2021, 2:03 PM

## 2021-01-01 NOTE — Procedures (Signed)
Central Venous Catheter Insertion Procedure Note  [name]  [id number]  [birth year]  Date:01/01/21  Time:3:42 PM   Provider Performing:Francine Hannan Jerilynn Mages Ayesha Rumpf   Procedure: Insertion of Non-tunneled Central Venous [phone]) with US guidance (11552)   Indication(s) Medication administration and Difficult access  Consent Risks of the procedure as well as the alternatives and risks of each were explained to the patient and/or caregiver.  Consent for the procedure was obtained and is signed in the bedside chart  Anesthesia Topical only with 1% lidocaine   Timeout Verified patient identification, verified procedure, site/side was marked, verified correct patient position, special equipment/implants available, medications/allergies/relevant history reviewed, required imaging and test results available.  Sterile Technique Maximal sterile technique including full sterile barrier drape, hand hygiene, sterile gown, sterile gloves, mask, hair covering, sterile ultrasound probe cover (if used).    Procedure Description Area of catheter insertion was cleaned with chlorhexidine and draped in sterile fashion.  With real-time ultrasound guidance a central venous catheter was placed into the left internal jugular vein. Nonpulsatile blood flow and easy flushing noted in all ports.  The catheter was sutured in place and sterile dressing applied.  Complications/Tolerance None; patient tolerated the procedure well. Chest X-ray is ordered to verify placement for internal jugular or subclavian cannulation.   Chest x-ray is not ordered for femoral cannulation.  EBL Minimal  Specimen(s) None  Procedure was supervised/proctored by Marni Griffon, NP.  Lestine Mount, PA-C [facility] 01/01/21 3:43 PM  Please see [email or web address] for pager details.

## 2021-01-01 NOTE — Consult Note (Signed)
Archie Nurse ostomy follow up Patient receiving care in [facility] 2M12. Stoma type/location: LUQ colostomy Stomal assessment/size: 1 3/4 inches; round, budded, red, moist Peristomal assessment: deferred; existing pouch placed less than 24 hours ago Treatment options for stomal/peristomal skin: barrier ring if needed Output: drops of serosanginous in existing pouch  Ostomy pouching: 2pc. 2 and 3/4 inch, pouch Lawson #[phone]; skin barrier Kellie Simmering #2. Education provided: none; patient intubated, sedated Enrolled patient in [facility] Discharge program:No 4 skin barriers and pouches to be ordered by [place]. Val Riles, RN, MSN, CWOCN, CNS-BC, pager [phone]

## 2021-01-01 NOTE — Progress Notes (Addendum)
NAME:  [name], MRN:  [id number], DOB:  19-Nov-1951, LOS: 43 ADMISSION DATE:  12/28/2020, CONSULTATION DATE: 12/18/2020 REFERRING MD:  Jesusita Oka, MD, CHIEF COMPLAINT: Acute hypoxic respiratory failure post laparotomy  Brief History:  70 year old male with end-stage renal disease on dialysis TTS, admitted with nausea, vomiting, and abdominal pain, noted to have pneumoperitoneum.  Underwent laparotomy 1/20 which was negative.  Incidentally found to be COVID-positive. On 1/30, his LFTs started to elevate and KUB showed worsening intraperitoneal air.  He was taken back to OR 1/31 for ex lap with end colostomy and left colectomy.  Returns to ICU on two vasopressors and remains intubated secondary to hemodynamic instability.   History of Present Illness:  71 year old male with diabetes, ESRD on HD, TTS, chronic diarrhea, chronic hypotension, PAF - systemic AC on hold w/hx of GIB, admitted 1/19 from SNF with N/V and abd pain found to have pneumoperitoneum.   Prior to this he had prolonged hospitalization 12/3- 1/10  at [facility] with diarrhea, dehydration/ hypotension, deconditioning being bed bound with sacral decub, then previously 11/12- 12/2 with syncope and colitis.   Underwent laparotomy 1/20 without findings of bowel perforation.  Incidentally COVID positive.  He was transferred to [facility] on 1/22.  He was doing better however his LFTs elevated on 1/30 and KUB 1/31 showed worsening intraperitoneal air.  Of note he has previously had multiple ulcers in the sigmoid colon of nonspecific etiology.  He was taken back to the OR today, 1/31 and underwent exploratory laparotomy with end colostomy and left colectomy- noting that entire left colon was boggy thought secondary to ischemia; also noted to have ulcerated area on anterior rectum.  Post operative, he returns to ICU on two vasopressors and remains intubated due to hemodynamic instability. EBL 50 ml.  PCCM re-consulted for ICU management.   Past Medical  History:  End-stage renal disease on dialysis Diabetes type 2 Paroxysmal A. fib Orthostatic hypotension Unstageable sacral decubitus ulcer (POA)  Significant Hospital Events:  1/19>> Admit to [facility] for abdominal pain-pneumoperitoneum on imaging studies- 1/20 exploratory laparotomy negative/ extubated  1/21 iHD, low dose phenyl needed, weaned off after iHD stopped, started on stress dose steroids - got 1 dose and was stopped, resumed home midodrine 1/22>> transferred to [facility] service. 1/26>> plans to discharge back to SNF-however upon further evaluation-needed inpatient hydrotherapy for worsening sacral wound. 1/27>> general surgery consulted for sacral wound-continue hydrotherapy for few more days. 1/31 Back to the OR for Pneumoperitoneum ( Exp. Lap Left colectomy, end colectomy), to ICU for shock post op, remains on vent  Consults:  [facility] Nephrology   Procedures:  1/10 right subclavian DL TDC >>  1/20 ex lap >> neg 1/31 ex lap >> end colostomy/ left colectomy   ETT 1/20; 1/31 >> Left radial aline 1/31 >>  Significant Diagnostic Tests:  1/20 CT abdomen and pelvis: There is a focal area of wall thickening with adjacent perforation of the distal descending colon at the junction of the sigmoid colon, likely the transition point. 2. There is moderately dilated sigmoid colon and rectum which is stool-filled and suggestion of possible pneumatosis. 3. Free air seen under the right hemidiaphragm and within the porta hepatis. 4. Small amount of air seen within the inferior right liver lobe which is concerning for portal venous gas given the patient's findings 5. Bilateral sacral decubitus ulceration with phlegmon and subcutaneous emphysema extending to the inferior coccyx.  1/31 KUB >> Massive pneumoperitoneum again noted. Note that this typically more than  would be expected given laparotomy on 12/30/2020 and an ongoing air leak secondary to visceral perforation should be considered.   Repeat CT imaging with oral contrast may be helpful to identify the source of leak.  Suspected long segment colitis involving the distal descending and sigmoid colon.  Micro Data:  1/20 influenza PCR negative 1/20 COVID PCR positive 1/21 MRSA PCR >> positive  1/31 BCx2 >>  Antimicrobials:  Zosyn 1/19 > 1/24; 1/31 >>  Interim History / Subjective:  Remains in  ICU intubated on MV ( 40% FiO2), NE 5 mcg/min and propofol at 10 mcg/kg/min Neo has been weaned to off.  3-4 + edema Upper and Lower Extremities, weeping  Synchronous vent dynamics + 2 L ( 1050 urine output  last 24 hours) 270 cc out of JP last 24 hours, this am 115 cc out since 7 am  (serosanguinous/ bloody)  Na 133 K 4.7 Creatinine 6.06 Phos 6.1, Mag 1.9 Albumin 1.7 AST 103/ ALT 108>> down trending Total Bili 1.3 HGB 7.8 ( from 8.8 1/31) Platelets 44,000 ( 55 K 1/31) CXR with new streaky opacity RML, and persistent , likely large volume, pneumoperitoneum  WBC 10.3 Afebrile, but + hypothermia ( 95.1) Blood Cx drawn 1/31 pending  Objective   Blood pressure (!) 168/90, pulse (!) 49, temperature (!) 96.3 F (35.7 C), temperature source Axillary, resp. rate 18, height 6\' 2"  (1.88 m), weight 68.3 kg, SpO2 100 %.    Vent Mode: PRVC FiO2 (%):  [40 %-100 %] 40 % Set Rate:  [18 bmp] 18 bmp Vt Set:  [650 mL] 650 mL PEEP:  [5 cmH20] 5 cmH20 Plateau Pressure:  [16 cmH20-19 cmH20] 16 cmH20   Intake/Output Summary (Last 24 hours) at 01/01/2021 0912 Last data filed at 01/01/2021 0700 Gross per 24 hour  Intake 760.71 ml  Output 1420 ml  Net -659.29 ml   Filed Weights   12/26/20 1612 12/27/20 0237 12/21/2020 0405  Weight: 67.4 kg 68 kg 68.3 kg    Examination:   Physical exam: General:  Thin, chronically ill appearing elderly male sedated on MV HEENT: MM pink/moist, ETT/ OGT, pupils 3/sluggish, anicteric  Neuro: sedated, follows  commands at present, nods head CV: rr, SB- 49-51, no murmur PULM:  Non labored, MV supported  breaths, bilateral chest excursion,  GI: midline abd incision with dry dressing, right JP with large output at present, left stoma pink, foley -  urine output less concentrated Extremities: dry/ cool, 3-4 +  upper extremity dependent edema, 3 + LE edema Skin: no rashes, left great toe with end dry ischemia, several dry ulcerations to toes, multiple pressure dressings and sacral wound.   + 2 L ( 1050 urine output  last 24 hours) 270 cc out of JP last 24 hours, this am 115 cc out since 7 am  (serosanguinous/ bloody)   Resolved Hospital Problem list   Large bowel obstruction status post exploratory laparotomy Acute metabolic acidosis COVID 19 infection- off isolation   Assessment & Plan:  Distal colonic micro perforation with pneumoperitoneum - s/p end colostomy and left colectomy 1/31, evidence of ischemic left colon - per [facility] - will remain NPO- see below, pharmacy consulted for TPN, will start 2/1 - trend LFTs/ coags >> down trending - follow surgical pathology  - BID dressing changes - Continue NG tube   Hypotension, hx orthostatic: multifactorial- poor nutrition, chronic hypotension, AI, sedation, rule out intra-abdominal infection given microperforation/ pneumoperitoneum - continue to wean Neo/ NE for MAP goal > 65 -  will place central line as TDC is currently assessed for pressors, continue Aline  - trend Lactic acid until clears - hold midodrine/ florinef given NPO, restart solucortef  - trend CBC and BMET( Need to follow platelets) - rule out sepsis component- no spike in fever/ WBC, BC pending  - zosyn per pharmacy for 5 days( start date 1/31) - nutrition- TPN as below   Acute hypoxic respiratory insufficiency post-operatively, previously due to volume overload from missing HD session Oppen abdomen - full MV support - CXR / ABG prn - VAP/ PPI  - daily SBT/ WUA - PAD protocol -> fentanyl gtt for pain, wean  propofol- may need to change to versed given hypotension, ideally  would do precedex but HR is 53.  - HD 2/1  ESRD on TTS (new to HD since 10/2020) - per nephrology, might need CRRT if remains on vasopressor support.  Last iHD 1/23 and 1/26 - Plan for HD 2/1 per renal note, unable to pull large volumes due to third spacing and low albumin 3rd spacing. - trend renal indices  - continue foley for now, still has UOP  Diabetes type 2: - SSI sensitive   PAF w/RVR - currently in NSR - hold PO amiodarone, may need to switch to IV - hold betablocker (while on pressors) - no systemic AC given prior GIB  Sacral wound- un-stageable, present on admission - per WOC recommendations - maximize nutrition  - Per Dr. Donne Hazel, would benefit from continued hydrotherapy  Anemia- multifactorial, of chronic illness, ABLA - transfused 1 unit 1/29 and 1/30 secondary to [facility] - remains on PPI BID - recheck H/H now, trend CBC - transfuse for Hgb < 7 - DIC panel pending  Thrombocytopenia  -  Platelets 44K ( 55 on  1/31) - Trend - Monitor for bleeding - transfuse platelets as needed if bleeding  Chronic debility/ deconditioning -  mostly bed bound now, PT/ OT when able  Protein calorie malnutrition>> severe - TPN per pharmacy   Best practice (evaluated daily)  Diet: NPO, pharmacy consulted for TPN Pain/Anxiety/Delirium protocol (if indicated): minimize- fentanyl/ low dose propofol  VAP protocol (if indicated): yes DVT prophylaxis: SCDs only  GI prophylaxis: PPI  Glucose control: SSI sensitive Mobility: PT/OT Disposition: ICU  Goals of Care:   Code Status: Full code Poor prognosis, Goals of care discussion should be ongoing with family.  I was unable to get in touch with daughter Jordan Hawks 2/1 for update.  I called her cell number, got a message and have left a message for her to call the unit.I will try again later today.     Labs   CBC: Recent Labs  Lab 12/29/20 0446 12/29/20 0728 12/29/20 2114 12/30/20 0327 12/02/2020 0325 12/15/2020 1335  12/08/2020 1418 12/10/2020 1724 12/04/2020 1751 01/01/21 0254  WBC 10.1   < > 9.6 9.2 9.2  --   --  8.4  --  10.3  NEUTROABS 8.8*  --   --  7.8* 8.1*  --   --  7.7  --  9.5*  HGB 6.5*   < > 8.4* 9.3* 9.4* 8.5* 8.5* 8.5* 8.8* 7.8*  HCT 22.3*   < > 27.0* 28.1* 28.9* 25.0* 25.0* 25.9* 26.0* 23.6*  MCV 99.6   < > 95.7 92.4 93.2  --   --  92.5  --  92.2  PLT 64*   < > 70* 71* 65*  --   --  55*  51*  --  44*   < > =  values in this interval not displayed.    Basic Metabolic Panel: Recent Labs  Lab 12/26/20 0146 12/27/20 0214 12/29/20 0446 12/30/20 0327 12/30/2020 0325 12/15/2020 1335 12/08/2020 1418 12/17/2020 1724 12/12/2020 1751 01/01/21 0254  NA 135   < > 132* 134* 134* 134* 135 132* 132* 133*  K 3.9   < > 4.4 4.3 4.3 4.2 4.1 4.2 4.2 4.7  CL 103   < > 100 101 100  --   --  100  --  100  CO2 16*   < > 19* 19* 16*  --   --  16*  --  14*  GLUCOSE 116*   < > 115* 122* 122*  --   --  167*  --  131*  BUN 59*   < > 55* 66* 79*  --   --  85*  --  88*  CREATININE 4.54*   < > 5.05* 5.42* 6.18*  --   --  6.14*  --  6.06*  CALCIUM 7.0*   < > 7.5* 7.6* 7.6*  --   --  7.5*  --  7.3*  MG 1.6*  --   --  2.1 2.0  --   --   --   --  1.9  PHOS  --   --  4.9*  --   --   --   --   --   --  6.1*   < > = values in this interval not displayed.   GFR: Estimated Creatinine Clearance: 11 mL/min (A) (by C-G formula based on SCr of 6.06 mg/dL (H)). Recent Labs  Lab 12/29/20 0446 12/29/20 0728 12/30/20 0327 12/25/2020 0325 12/22/2020 1724 01/01/21 0254  WBC 10.1   < > 9.2 9.2 8.4 10.3  LATICACIDVEN 2.9*  --   --   --  1.5  --    < > = values in this interval not displayed.    Liver Function Tests: Recent Labs  Lab 12/26/20 0146 12/27/20 0214 12/29/20 0446 12/30/20 0327 12/03/2020 0325 01/01/21 0254  AST 27 30  --  239* 200* 103*  ALT 13 14  --  175* 161* 108*  ALKPHOS 125 127*  --  167* 167* 107  BILITOT 0.7 1.1  --  1.3* 0.8 1.3*  PROT 4.6* 4.8*  --  4.6* 4.7* 4.5*  ALBUMIN 1.5* 1.8* 1.6* 1.7* 1.6*  1.7*   No results for input(s): LIPASE, AMYLASE in the last 168 hours. No results for input(s): AMMONIA in the last 168 hours.  ABG    Component Value Date/Time   PHART 7.420 12/10/2020 1751   PCO2ART 28.5 (L) 12/03/2020 1751   PO2ART 476 (H) 12/06/2020 1751   HCO3 18.8 (L) 12/23/2020 1751   TCO2 20 (L) 12/21/2020 1751   ACIDBASEDEF 5.0 (H) 12/06/2020 1751   O2SAT 100.0 12/27/2020 1751     Coagulation Profile: Recent Labs  Lab 12/27/2020 0325 12/30/2020 1724 01/01/21 0254  INR 1.1 1.2 1.2    Cardiac Enzymes: No results for input(s): CKTOTAL, CKMB, CKMBINDEX, TROPONINI in the last 168 hours.  HbA1C: Hgb A1c MFr Bld  Date/Time Value Ref Range Status  12/21/2020 12:08 PM 4.1 (L) 4.8 - 5.6 % Final    Comment:    (NOTE) Pre diabetes:          5.7%-6.4%  Diabetes:              >6.4%  Glycemic control for   <7.0% adults with diabetes   06/13/2018 03:16 AM  6.6 (H) 4.8 - 5.6 % Final    Comment:    (NOTE) Pre diabetes:          5.7%-6.4% Diabetes:              >6.4% Glycemic control for   <7.0% adults with diabetes     CBG: Recent Labs  Lab 12/17/2020 1729 12/16/2020 2108 12/12/2020 2329 01/01/21 0332 01/01/21 0801  GLUCAP 146* 156* 162* 132* 131*    Critical Care Time:  Total critical care time: 45 minutes  Magdalen Spatz, MSN, AGACNP-BC [place] for personal pager 01/01/2021, 9:12 AM

## 2021-01-01 NOTE — Progress Notes (Signed)
OT Cancellation Note  Patient Details Name: [name] MRN: [id number] DOB: 03/13/1951   Cancelled Treatment:    Reason Eval/Treat Not Completed: Patient not medically ready (Pt now s/p abd surgery and still intubated. RN recommends hold therapies) [name], OTR/L Acute Rehabilitation Services Pager: [phone] Office: [place] 01/01/2021, 1:37 PM

## 2021-01-01 NOTE — Progress Notes (Signed)
Assisted tele visit to patient with daughter. ° °Janaiyah Blackard P, RN  °

## 2021-01-01 NOTE — TOC Progression Note (Signed)
Transition of Care [facility]) - Progression Note    Patient Details  Name: ROE WILNER MRN: [id number] Date of Birth: 1951-09-19  Transition of Care [facility]) CM/SW Contact  Carles Collet, RN Phone Number: 01/01/2021, 11:57 AM  Clinical Narrative:    Damaris Schooner w [facility] and cancelled transportation to HD, due to inpatient stay.  Next scheduled transportation to HD will be 2/9. Please call [facility] at [phone] if needs change.     Expected Discharge Plan: [place] Barriers to Discharge: Continued Medical Work up  Expected Discharge Plan and Services Expected Discharge Plan: [place] In-house Referral: Clinical Social Work   Post Acute Care Choice: [place] Living arrangements for the past 2 months: [place] Expected Discharge Date: 12/26/20                                     Social Determinants of Health (SDOH) Interventions    Readmission Risk Interventions Readmission Risk Prevention Plan 12/25/2020  Transportation Screening Complete  Medication Review [employer]) Referral to Pharmacy  PCP or Specialist appointment within 3-5 days of discharge Complete  HRI or Home Care Consult Complete  SW Recovery Care/Counseling Consult Complete  Palliative Care Screening Not [place] Complete  Some recent data might be hidden

## 2021-01-01 NOTE — Progress Notes (Signed)
Patient ID: [name], male   DOB: 06-29-1951, 70 y.o.   MRN: [id number] S: s/p left colectomy/colostomy yesterday and on vent. O:BP (!) 168/90 (BP Location: Right Leg)   Pulse (!) 49   Temp (!) 96.3 F (35.7 C) (Axillary)   Resp 18   Ht 6' 2"  (1.88 m)   Wt 68.3 kg   SpO2 100%   BMI 19.33 kg/m   Intake/Output Summary (Last 24 hours) at 01/01/2021 0942 Last data filed at 01/01/2021 0700 Gross per 24 hour  Intake 760.71 ml  Output 1420 ml  Net -659.29 ml   Intake/Output: I/O last 3 completed shifts: In: 1060.7 [P.O.:300; I.V.:160.7; IV Piggyback:600] Out: 1700 [Urine:1050; Drains:270; Blood:100]  Intake/Output this shift:  No intake/output data recorded. Weight change:  Gen: intubated and sedated CVS: bradycardic, no rub Resp: ventilated BS bilaterally Abd: +BS, ostomy in LLQ Ext: 1+ edema bilateral lower ext  Recent Labs  Lab 12/26/20 0146 12/27/20 0214 12/29/20 0446 12/30/20 0327 12/24/2020 0325 12/30/2020 1335 12/07/2020 1418 12/04/2020 1724 12/26/2020 1751 01/01/21 0254  NA 135 137 132* 134* 134* 134* 135 132* 132* 133*  K 3.9 3.7 4.4 4.3 4.3 4.2 4.1 4.2 4.2 4.7  CL 103 102 100 101 100  --   --  100  --  100  CO2 16* 21* 19* 19* 16*  --   --  16*  --  14*  GLUCOSE 116* 153* 115* 122* 122*  --   --  167*  --  131*  BUN 59* 36* 55* 66* 79*  --   --  85*  --  88*  CREATININE 4.54* 3.43* 5.05* 5.42* 6.18*  --   --  6.14*  --  6.06*  ALBUMIN 1.5* 1.8* 1.6* 1.7* 1.6*  --   --   --   --  1.7*  CALCIUM 7.0* 7.3* 7.5* 7.6* 7.6*  --   --  7.5*  --  7.3*  PHOS  --   --  4.9*  --   --   --   --   --   --  6.1*  AST 27 30  --  239* 200*  --   --   --   --  103*  ALT 13 14  --  175* 161*  --   --   --   --  108*   Liver Function Tests: Recent Labs  Lab 12/30/20 0327 12/01/2020 0325 01/01/21 0254  AST 239* 200* 103*  ALT 175* 161* 108*  ALKPHOS 167* 167* 107  BILITOT 1.3* 0.8 1.3*  PROT 4.6* 4.7* 4.5*  ALBUMIN 1.7* 1.6* 1.7*   No results for input(s): LIPASE, AMYLASE in the  last 168 hours. No results for input(s): AMMONIA in the last 168 hours. CBC: Recent Labs  Lab 12/29/20 2114 12/30/20 0327 12/27/2020 0325 12/13/2020 1335 12/21/2020 1724 12/16/2020 1751 01/01/21 0254  WBC 9.6 9.2 9.2  --  8.4  --  10.3  NEUTROABS  --  7.8* 8.1*  --  7.7  --  9.5*  HGB 8.4* 9.3* 9.4*   < > 8.5* 8.8* 7.8*  HCT 27.0* 28.1* 28.9*   < > 25.9* 26.0* 23.6*  MCV 95.7 92.4 93.2  --  92.5  --  92.2  PLT 70* 71* 65*  --  55*  51*  --  44*   < > = values in this interval not displayed.   Cardiac Enzymes: No results for input(s): CKTOTAL, CKMB, CKMBINDEX, TROPONINI in the last 168  hours. CBG: Recent Labs  Lab 12/30/2020 1729 12/16/2020 2108 12/22/2020 2329 01/01/21 0332 01/01/21 0801  GLUCAP 146* 156* 162* 132* 131*    Iron Studies: No results for input(s): IRON, TIBC, TRANSFERRIN, FERRITIN in the last 72 hours. Studies/Results: DG Chest Port 1 View  Result Date: 01/01/2021 CLINICAL DATA:  Respiratory failure EXAM: PORTABLE CHEST 1 VIEW.  Patient is rotated. COMPARISON:  Chest x-ray 12/02/2020 FINDINGS: Endotracheal tube with tip approximately 8.5 cm above the carina. Enteric tube coursing below the hemidiaphragm with tip overlying the gastric lumen and side port overlying the expected region of the distal esophagus. Right dialysis catheter with tip overlying the right atrium. The heart size and mediastinal contours are within normal limits. Interval development of streaky airspace opacity within the right middle lobe. Suggestion of vague patchy airspace opacities within bilateral lungs. No pulmonary edema. No pleural effusion. No pneumothorax. No acute osseous abnormality. Persistent, likely large volume, pneumoperitoneum. IMPRESSION: 1. Endotracheal tube with tip approximately 8.5 cm above the carina. Recommend advancing by 1-2 cm. 2. Enteric tube coursing below the hemidiaphragm with tip overlying the gastric lumen and side port overlying the expected region of the distal esophagus.  Recommend advancing by 5 cm. 3. Interval development of streaky airspace opacity within the right middle lobe. 4. Persistent, likely large volume, pneumoperitoneum. Electronically Signed   By: Iven Finn M.D.   On: 01/01/2021 05:12   DG Chest Port 1 View  Result Date: 12/08/2020 CLINICAL DATA:  ETT placement EXAM: PORTABLE CHEST 1 VIEW COMPARISON:  12/17/2020 at 0842 hours FINDINGS: Endotracheal tube terminates 7.5 cm above the carina. Enteric tube courses into the stomach, with its side port at the GE junction. Right I did dual lumen dialysis catheter terminates in the upper right atrium. Mild patchy opacities in the central left upper lobe and bilateral lower lobes, suggesting multifocal pneumonia or aspiration. No definite pleural effusions. No pneumothorax. The heart is normal in size. IMPRESSION: Endotracheal tube terminates 7.5 cm above the carina. Additional support apparatus as above. Suspected multifocal pneumonia or aspiration. Electronically Signed   By: Julian Hy M.D.   On: 12/15/2020 15:59   DG Chest Port 1 View  Result Date: 12/16/2020 CLINICAL DATA:  Dyspnea EXAM: PORTABLE CHEST 1 VIEW COMPARISON:  12/22/2020 FINDINGS: Interval extubation. Nasogastric tube is been removed. Pulmonary insufflation has diminished slightly. There is interval development of bilateral perihilar and lower lung zone airspace infiltrate, likely infectious or inflammatory. No pneumothorax or pleural effusion. Right internal jugular hemodialysis catheter tip noted within the right atrium. Cardiac size within normal limits. The pulmonary vascularity is normal. Massive pneumoperitoneum is again identified below the hemidiaphragm. IMPRESSION: Interval extubation.  Diminished pulmonary insufflation. Interval development of bilateral mid and lower lung zone airspace infiltrate, likely infectious or inflammatory. Massive pneumoperitoneum again noted. Electronically Signed   By: Fidela Salisbury MD   On: 12/07/2020  09:05   DG Abd Portable 1V  Result Date: 12/03/2020 CLINICAL DATA:  Constipation EXAM: PORTABLE ABDOMEN - 1 VIEW COMPARISON:  12/29/2020 FINDINGS: The subdiaphragmatic region is excluded from view. Massive pneumoperitoneum is again identified. Laparotomy skin staples overlie the mid abdomen. Note that the degree of pneumoperitoneum is more than would be typically expected following laparotomy on 12/03/2020. Moderate stool is seen throughout the colon and within the rectal vault. There is loss of typical how Stroll markings involving the a descending and sigmoid colon with apparent wall thickening of the bowel in keeping with a long segment colitis within this region. IMPRESSION: Massive pneumoperitoneum  again noted. Note that this typically more than would be expected given laparotomy on 12/26/2020 and an ongoing air leak secondary to visceral perforation should be considered. Repeat CT imaging with oral contrast may be helpful to identify the source of leak. Suspected long segment colitis involving the distal descending and sigmoid colon. Electronically Signed   By: Fidela Salisbury MD   On: 12/01/2020 09:09   US Abdomen Limited RUQ (LIVER/GB)  Result Date: 12/30/2020 CLINICAL DATA:  Transaminitis. EXAM: ULTRASOUND ABDOMEN LIMITED RIGHT UPPER QUADRANT COMPARISON:  None. FINDINGS: Gallbladder: Gallbladder is distended and filled with sludge. Borderline gallbladder wall thickening. No sonographic Murphy sign elicited. Common bile duct: Diameter: 4 mm Liver: No focal lesion identified. Diffusely echogenic indicating fatty infiltration. Portal vein is patent on color Doppler imaging with normal direction of blood flow towards the liver. Other: RIGHT upper quadrant ascites.  RIGHT-sided pleural effusion. IMPRESSION: 1. Gallbladder is distended and filled with sludge. No convincing sonographic evidence of acute cholecystitis. 2. No bile duct dilatation. 3. Fatty infiltration of the liver. 4. Ascites. Electronically  Signed   By: Franki Cabot M.D.   On: 12/30/2020 15:46   . chlorhexidine gluconate (MEDLINE KIT)  15 mL Mouth Rinse BID  . Chlorhexidine Gluconate Cloth  6 each Topical Q0600  . heparin sodium (porcine)      . hydrocortisone sod succinate (SOLU-CORTEF) inj  50 mg Intravenous Q6H  . insulin aspart  0-6 Units Subcutaneous Q4H  . mouth rinse  15 mL Mouth Rinse 10 times per day  . mupirocin ointment   Nasal BID  . pantoprazole (PROTONIX) IV  40 mg Intravenous Q12H    BMET    Component Value Date/Time   NA 133 (L) 01/01/2021 0254   K 4.7 01/01/2021 0254   CL 100 01/01/2021 0254   CO2 14 (L) 01/01/2021 0254   GLUCOSE 131 (H) 01/01/2021 0254   BUN 88 (H) 01/01/2021 0254   CREATININE 6.06 (H) 01/01/2021 0254   CALCIUM 7.3 (L) 01/01/2021 0254   GFRNONAA 9 (L) 01/01/2021 0254   GFRAA 19 (L) 06/15/2018 0407   CBC    Component Value Date/Time   WBC 10.3 01/01/2021 0254   RBC 2.56 (L) 01/01/2021 0254   HGB 7.8 (L) 01/01/2021 0254   HCT 23.6 (L) 01/01/2021 0254   PLT 44 (L) 01/01/2021 0254   MCV 92.2 01/01/2021 0254   MCH 30.5 01/01/2021 0254   MCHC 33.1 01/01/2021 0254   RDW 17.9 (H) 01/01/2021 0254   LYMPHSABS 0.4 (L) 01/01/2021 0254   MONOABS 0.3 01/01/2021 0254   EOSABS 0.0 01/01/2021 0254   BASOSABS 0.0 01/01/2021 0254     OP HD: TTS [facility] 66.5kg dry wt per OP HD RN/ R IJ TDC   Assessment/Plan:  1. Pneumoperitoneum/ shock -sp exlap, no perforated viscous found. Abd films today with worsening pneumoperitoneum and s/p left colectomy/colostomy 12/17/2020. 2. VDRF- currently on vent post op per PCCM 3. ESRD - on HD TTS. Missed OP HD x 2. Had HD here 1/23 and 1/26 this week. HD bumped from Sat > Sun and now bumped to Monday. Plan for HD later today/tonight.  Labs stable 4. Covid positive - therapies per primary team 5. Orthostatic hypotension - chronic issue, pt is bed-bound it appears and does stretcher dialysis w/ the group in [facility]. On midodrine and  fluorinef outpatient per charting.  6. BP /volume - has sig LE/ UE edema, up 1-2kg by wt's, no resp issues. Unable to UF  large amts due to low alb/ 3rd spacing of fluid.  7. DM2 - per primary team  8. Anemia ckd - no emergent need for PRBC's. need records re: ESA dosing.  9. Hypocalcemia - repleted; added ca bath 10. Prognosis - very poor and has been seen by Palliative care in the past.     Donetta Potts, MD [facility] [phone]

## 2021-01-01 NOTE — Progress Notes (Addendum)
Initial Nutrition Assessment  DOCUMENTATION CODES:   Severe malnutrition in context of chronic illness  INTERVENTION:    TPN dosing per Pharmacy.  Recommend provide lipids 3 times weekly to help meet nutrition needs since patient is severely malnourished.  Monitor magnesium, potassium, and phosphorus, MD to replete as needed, as pt is at risk for refeeding syndrome given severe PCM.  Recommend supplement vitamin C and zinc as levels were low.   NUTRITION DIAGNOSIS:   Severe Malnutrition related to chronic illness (ESRD on HD) as evidenced by severe muscle depletion,severe fat depletion.  Ongoing   GOAL:   Patient will meet greater than or equal to 90% of their needs  Progressing with TPN initiation  MONITOR:   Vent status,Labs,Skin,I & O's  REASON FOR ASSESSMENT:   Ventilator,Consult New TPN/TNA  ASSESSMENT:   70 yo male admitted with with pneumoperitoneum, shock, acute metabolic acidosis, respiratory failure due to volume overload- ESRD/HD (started 10/2020), Pt missed HD x 2 prior to admission.Pt also COVID+.  PMH includes DM, unstageable sacral PI  Discussed patient in ICU rounds and with RN today. S/P exploratory laparotomy, left colectomy, end colostomy 1/31. Remains on vent post-op. JP drain in place with bloody output today. Plans to begin TPN today. Lipids being held the first day, then will receive lipids M-W-F due to ILE shortage.  Usually receives HD on T-Th-Sat.  Patient is currently intubated on ventilator support MV: 11.6 L/min Temp (24hrs), Avg:96.6 F (35.9 C), Min:95.1 F (35.1 C), Max:98.5 F (36.9 C)  Propofol: stopped this afternoon  TPN at 25 ml/h will provide 513 kcal and 67 gm protein for the first day without lipids.  Labs reviewed. Na 133, BUN 88, Creat 6.06, phos 6.1, prealbumin < 5, trig 121 CBG: 132-131 Vitamin C < 0.1 (L) Vitamin A 6 (L) Zinc 42 (L)  Medications reviewed and include solucortef, novolog, protonix,  levophed.  Admission weight 61.2 kg, current weight 68.3 kg I/O +2.2 L JP drain 270 ml output x 24 hours UOP 1050 ml x 24 hours  Patient meets criteria for severe malnutrition, given severe depletion of muscle and subcutaneous fat mass.  NUTRITION - FOCUSED PHYSICAL EXAM:  Flowsheet Row Most Recent Value  Orbital Region Severe depletion  Upper Arm Region Unable to assess  Thoracic and Lumbar Region Severe depletion  Buccal Region Unable to assess  Temple Region Severe depletion  Clavicle Bone Region Severe depletion  Clavicle and Acromion Bone Region Severe depletion  Scapular Bone Region Unable to assess  Dorsal Hand Unable to assess  Patellar Region Mild depletion  Anterior Thigh Region Mild depletion  Posterior Calf Region Mild depletion  Edema (RD Assessment) Severe  Hair Reviewed  Eyes Unable to assess  Mouth Unable to assess  Skin Reviewed  Nails Reviewed       Diet Order:   Diet Order            Diet NPO time specified Except for: Sips with Meds  Diet effective now                 EDUCATION NEEDS:   Not appropriate for education at this time  Skin:  Skin Assessment: Skin Integrity Issues: Skin Integrity Issues:: Other (Comment),Incisions Unstageable: sacrum Incisions: abdomen Other: L great toe ulcer  Last BM:  1/31 type 4; no output from colostomy documented  Height:   Ht Readings from Last 1 Encounters:  01/01/21 6\' 2"  (1.88 m)    Weight:   Wt Readings from Last 1  Encounters:  12/03/2020 68.3 kg    Ideal Body Weight:  86.4 kg  BMI:  Body mass index is 19.33 kg/m.  Estimated Nutritional Needs:   Kcal:  1785-2000  Protein:  100-120 g  Fluid:  1000 mL plus UOP   Lucas Mallow, RD, LDN, CNSC Please refer to Amion for contact information.

## 2021-01-01 NOTE — Progress Notes (Signed)
PT Cancellation Note  Patient Details Name: ADRIC WREDE MRN: [id number] DOB: August 14, 1951   Cancelled Treatment:    Reason Eval/Treat Not Completed: Medical issues which prohibited therapy  Pt now s/p abd surgery and still intubated. Breathing 44 bpm. RN recommends hold therapies.    Arby Barrette, PT Pager [phone]  [name] 01/01/2021, 10:43 AM

## 2021-01-01 NOTE — Progress Notes (Addendum)
PHARMACY - TOTAL PARENTERAL NUTRITION CONSULT NOTE   Indication: Massive bowel resection and severe malnutrition  Patient Measurements: Height: 6\' 2"  (188 cm) Weight: 68.3 kg (150 lb 9.2 oz) IBW/kg (Calculated) : 82.2 TPN AdjBW (KG): 69.2 Body mass index is 19.33 kg/m.  Assessment: 70 year old male with diabetes, ESRDon HD, TTS,chronic diarrhea, chronic hypotension, PAF - systemic AC on hold w/hx of GIB, admitted 1/19 from SNF with N/V and abd pain found to have pneumoperitoneum. Underwent laparotomy 1/20 without findings of bowel perforation. Incidentally COVID positive. He was transferred to [facility] on 1/22. He was doing better however his LFTs elevated on 1/30 and KUB 1/31 showed worsening intraperitoneal air. Of note he has previously had multiple ulcers in the sigmoid colon of nonspecific etiology. He was taken back to the OR 1/31 and underwent exploratory laparotomy with end colostomy and left colectomy- noting that entire left colon was boggy thought secondary to ischemia; also noted to have ulcerated area on anterior rectum. Pharmacy consulted for TPN.   Glucose / Insulin: CBGs 130-160s on SSI  Electrolytes: Na 133. CO2 14. Elevated phos 6.1. CoCa 9.1. K up 4.7, Mg 1.9 Renal: ESRD -HD  LFTs / TGs: LFTs elevated- trending down (103/108). Tbili 1.3.  Prealbumin / albumin: Pre-albumin <5.  Intake / Output; MIVF: Requiring Levophed. Propofol at 10 mcg/kg/min (RASS -4). UOP 0.5 mL/kg/hr. Net +2.2L. LBM 1/31.  GI Imaging: none since start of TPN Surgeries / Procedures: none since start of TPN  Central access: CVC to placed 01/01/21 TPN start date: 01/01/21  Nutritional Goals(per RD recommendation on 1/27; may have new goals since major surgery - will need to concentrate due to ESRD): Kcal:2000-2200 kcals; Protein:100-120 g; Fluid:1000 mL plus UOP Goal rate:  ~ 50 ml/hr for 100% protein and kcal needs  Current Nutrition:  NPO  Plan:  Start concentrated TPN at 71mL/hr at  1800 Given shortage of ILE - if provided, will only be on MWF. Holding for 7-10 days can be considered - will await RD evaluation. Will hold lipids in TPN today - Patient receiving some lipid in form of Propofol currently. Electrolytes in TPN: 37mEq/L of Na, 87mEq/L of K, 61mEq/L of Ca, 37mEq/L of Mg, and 90mmol/L of Phos. Maximize Acetate Add standard MVI and trace elements to TPN Continue Very Sensitive q4h SSI and adjust as needed  Monitor TPN labs on Mon/Thurs, daily for first several days  Sloan Leiter, PharmD, BCPS, BCCCP Clinical Pharmacist Please refer to [facility] for Wise numbers 01/01/2021,7:13 AM

## 2021-01-01 DEATH — deceased

## 2021-01-02 ENCOUNTER — Inpatient Hospital Stay (HOSPITAL_COMMUNITY): Payer: Medicare Other

## 2021-01-02 DIAGNOSIS — R6521 Severe sepsis with septic shock: Secondary | ICD-10-CM | POA: Diagnosis not present

## 2021-01-02 DIAGNOSIS — A419 Sepsis, unspecified organism: Secondary | ICD-10-CM | POA: Diagnosis not present

## 2021-01-02 DIAGNOSIS — J9601 Acute respiratory failure with hypoxia: Secondary | ICD-10-CM | POA: Diagnosis not present

## 2021-01-02 DIAGNOSIS — E43 Unspecified severe protein-calorie malnutrition: Secondary | ICD-10-CM | POA: Insufficient documentation

## 2021-01-02 DIAGNOSIS — K668 Other specified disorders of peritoneum: Secondary | ICD-10-CM | POA: Diagnosis not present

## 2021-01-02 LAB — CBC WITH DIFFERENTIAL/PLATELET
Abs Immature Granulocytes: 0.06 10*3/uL (ref 0.00–0.07)
Basophils Absolute: 0 10*3/uL (ref 0.0–0.1)
Basophils Relative: 0 %
Eosinophils Absolute: 0 10*3/uL (ref 0.0–0.5)
Eosinophils Relative: 0 %
HCT: 24 % — ABNORMAL LOW (ref 39.0–52.0)
Hemoglobin: 7.7 g/dL — ABNORMAL LOW (ref 13.0–17.0)
Immature Granulocytes: 1 %
Lymphocytes Relative: 5 %
Lymphs Abs: 0.5 10*3/uL — ABNORMAL LOW (ref 0.7–4.0)
MCH: 30.4 pg (ref 26.0–34.0)
MCHC: 32.1 g/dL (ref 30.0–36.0)
MCV: 94.9 fL (ref 80.0–100.0)
Monocytes Absolute: 0.4 10*3/uL (ref 0.1–1.0)
Monocytes Relative: 4 %
Neutro Abs: 9.9 10*3/uL — ABNORMAL HIGH (ref 1.7–7.7)
Neutrophils Relative %: 90 %
Platelets: 49 10*3/uL — ABNORMAL LOW (ref 150–400)
RBC: 2.53 MIL/uL — ABNORMAL LOW (ref 4.22–5.81)
RDW: 18.5 % — ABNORMAL HIGH (ref 11.5–15.5)
WBC: 10.9 10*3/uL — ABNORMAL HIGH (ref 4.0–10.5)
nRBC: 0 % (ref 0.0–0.2)

## 2021-01-02 LAB — COMPREHENSIVE METABOLIC PANEL
ALT: 86 U/L — ABNORMAL HIGH (ref 0–44)
AST: 58 U/L — ABNORMAL HIGH (ref 15–41)
Albumin: 1.7 g/dL — ABNORMAL LOW (ref 3.5–5.0)
Alkaline Phosphatase: 120 U/L (ref 38–126)
Anion gap: 19 — ABNORMAL HIGH (ref 5–15)
BUN: 104 mg/dL — ABNORMAL HIGH (ref 8–23)
CO2: 14 mmol/L — ABNORMAL LOW (ref 22–32)
Calcium: 7.5 mg/dL — ABNORMAL LOW (ref 8.9–10.3)
Chloride: 98 mmol/L (ref 98–111)
Creatinine, Ser: 6.45 mg/dL — ABNORMAL HIGH (ref 0.61–1.24)
GFR, Estimated: 9 mL/min — ABNORMAL LOW (ref >60)
Glucose, Bld: 216 mg/dL — ABNORMAL HIGH (ref 70–99)
Potassium: 5.2 mmol/L — ABNORMAL HIGH (ref 3.5–5.1)
Sodium: 131 mmol/L — ABNORMAL LOW (ref 135–145)
Total Bilirubin: 1.2 mg/dL (ref 0.3–1.2)
Total Protein: 4.6 g/dL — ABNORMAL LOW (ref 6.5–8.1)

## 2021-01-02 LAB — GLUCOSE, CAPILLARY
Glucose-Capillary: 132 mg/dL — ABNORMAL HIGH (ref 70–99)
Glucose-Capillary: 142 mg/dL — ABNORMAL HIGH (ref 70–99)
Glucose-Capillary: 166 mg/dL — ABNORMAL HIGH (ref 70–99)
Glucose-Capillary: 185 mg/dL — ABNORMAL HIGH (ref 70–99)
Glucose-Capillary: 187 mg/dL — ABNORMAL HIGH (ref 70–99)
Glucose-Capillary: 188 mg/dL — ABNORMAL HIGH (ref 70–99)
Glucose-Capillary: 220 mg/dL — ABNORMAL HIGH (ref 70–99)

## 2021-01-02 LAB — PHOSPHORUS: Phosphorus: 7.2 mg/dL — ABNORMAL HIGH (ref 2.5–4.6)

## 2021-01-02 LAB — PROTIME-INR
INR: 1.1 (ref 0.8–1.2)
Prothrombin Time: 14.2 seconds (ref 11.4–15.2)

## 2021-01-02 LAB — SURGICAL PATHOLOGY

## 2021-01-02 LAB — MAGNESIUM: Magnesium: 1.9 mg/dL (ref 1.7–2.4)

## 2021-01-02 MED ORDER — NOREPINEPHRINE 16 MG/250ML-% IV SOLN
0.0000 ug/min | INTRAVENOUS | Status: DC
  Administered 2021-01-02: 7 ug/min via INTRAVENOUS
  Administered 2021-01-05: 4 ug/min via INTRAVENOUS
  Administered 2021-01-06: 2 ug/min via INTRAVENOUS
  Filled 2021-01-02 (×3): qty 250

## 2021-01-02 MED ORDER — PANTOPRAZOLE SODIUM 40 MG IV SOLR
40.0000 mg | INTRAVENOUS | Status: DC
  Administered 2021-01-03 – 2021-01-09 (×7): 40 mg via INTRAVENOUS
  Filled 2021-01-02 (×7): qty 40

## 2021-01-02 MED ORDER — TRACE MINERALS CU-MN-SE-ZN 300-55-60-3000 MCG/ML IV SOLN
INTRAVENOUS | Status: AC
  Filled 2021-01-02: qty 451.2

## 2021-01-02 MED ORDER — SODIUM CHLORIDE 0.9% FLUSH
10.0000 mL | Freq: Two times a day (BID) | INTRAVENOUS | Status: DC
  Administered 2021-01-02 – 2021-01-10 (×16): 10 mL

## 2021-01-02 MED ORDER — INSULIN ASPART 100 UNIT/ML ~~LOC~~ SOLN
0.0000 [IU] | SUBCUTANEOUS | Status: DC
  Administered 2021-01-02 (×3): 2 [IU] via SUBCUTANEOUS
  Administered 2021-01-03 (×4): 3 [IU] via SUBCUTANEOUS
  Administered 2021-01-03: 7 [IU] via SUBCUTANEOUS
  Administered 2021-01-03: 2 [IU] via SUBCUTANEOUS
  Administered 2021-01-04: 7 [IU] via SUBCUTANEOUS

## 2021-01-02 MED ORDER — HEPARIN SODIUM (PORCINE) 1000 UNIT/ML IJ SOLN
INTRAMUSCULAR | Status: AC
  Administered 2021-01-02: 3800 [IU] via INTRAVENOUS_CENTRAL
  Filled 2021-01-02: qty 4

## 2021-01-02 MED ORDER — COLLAGENASE 250 UNIT/GM EX OINT
TOPICAL_OINTMENT | Freq: Every day | CUTANEOUS | Status: DC
  Filled 2021-01-02 (×2): qty 30

## 2021-01-02 NOTE — Consult Note (Signed)
[place] Nurse ostomy follow up Patient receiving care in [facility] 2M12.  Patient intubated, not able to be taught ostomy care today. Stoma type/location: LUQ, end colostomy  Stomal assessment/size: 1 3/4 inches round, red, moist, edematous Peristomal assessment: intact Treatment options for stomal/peristomal skin: barrier ring if needed; not required today Output: drops of serosanginous only Ostomy pouching: 2pc. 2 and 3/4 inch pouch and skin barrier  Education provided: none Enrolled patient in [facility] Discharge program: No Val Riles, RN, MSN, [employer], CNS-BC, pager [phone]

## 2021-01-02 NOTE — Progress Notes (Signed)
NAME:  [name], MRN:  [id number], DOB:  May 22, 1951, LOS: 62 ADMISSION DATE:  12/12/2020, CONSULTATION DATE: 12/29/2020 REFERRING MD:  Jesusita Oka, MD, CHIEF COMPLAINT: Acute hypoxic respiratory failure post laparotomy  Brief History:  70 year old male with end-stage renal disease on dialysis TTS, admitted with nausea, vomiting, and abdominal pain, noted to have pneumoperitoneum.  Underwent laparotomy 1/20 which was negative.  Incidentally found to be COVID-positive. On 1/30, his LFTs started to elevate and KUB showed worsening intraperitoneal air.  He was taken back to OR 1/31 for ex lap with end colostomy and left colectomy.  Returns to ICU on two vasopressors and remains intubated secondary to hemodynamic instability.   History of Present Illness:  70 year old male with diabetes, ESRD on HD, TTS, chronic diarrhea, chronic hypotension, PAF - systemic AC on hold w/hx of GIB, admitted 1/19 from SNF with N/V and abd pain found to have pneumoperitoneum.   Prior to this he had prolonged hospitalization 12/3- 1/10  at [facility] with diarrhea, dehydration/ hypotension, deconditioning being bed bound with sacral decub, then previously 11/12- 12/2 with syncope and colitis.   Underwent laparotomy 1/20 without findings of bowel perforation.  Incidentally COVID positive.  He was transferred to [facility] on 1/22.  He was doing better however his LFTs elevated on 1/30 and KUB 1/31 showed worsening intraperitoneal air.  Of note he has previously had multiple ulcers in the sigmoid colon of nonspecific etiology.  He was taken back to the OR today, 1/31 and underwent exploratory laparotomy with end colostomy and left colectomy- noting that entire left colon was boggy thought secondary to ischemia; also noted to have ulcerated area on anterior rectum.  Post operative, he returns to ICU on two vasopressors and remains intubated due to hemodynamic instability. EBL 50 ml.  PCCM re-consulted for ICU management.   Past Medical  History:  End-stage renal disease on dialysis Diabetes type 2 Paroxysmal A. fib Orthostatic hypotension Unstageable sacral decubitus ulcer (POA)  Significant Hospital Events:  1/19>> Admit to [facility] for abdominal pain-pneumoperitoneum on imaging studies- 1/20 exploratory laparotomy negative/ extubated  1/21 iHD, low dose phenyl needed, weaned off after iHD stopped, started on stress dose steroids - got 1 dose and was stopped, resumed home midodrine 1/22>> transferred to [facility] service. 1/26>> plans to discharge back to SNF-however upon further evaluation-needed inpatient hydrotherapy for worsening sacral wound. 1/27>> general surgery consulted for sacral wound-continue hydrotherapy for few more days. 1/31 Back to the OR for Pneumoperitoneum ( Exp. Lap Left colectomy, end colectomy), to ICU for shock post op, remains on vent  Consults:  [facility] Nephrology   Procedures:  1/10 right subclavian DL TDC >>  1/20 ex lap >> neg 1/31 ex lap >> end colostomy/ left colectomy   ETT 1/20; 1/31 >> Left radial aline 1/31 >>  Significant Diagnostic Tests:  1/20 CT abdomen and pelvis: There is a focal area of wall thickening with adjacent perforation of the distal descending colon at the junction of the sigmoid colon, likely the transition point. 2. There is moderately dilated sigmoid colon and rectum which is stool-filled and suggestion of possible pneumatosis. 3. Free air seen under the right hemidiaphragm and within the porta hepatis. 4. Small amount of air seen within the inferior right liver lobe which is concerning for portal venous gas given the patient's findings 5. Bilateral sacral decubitus ulceration with phlegmon and subcutaneous emphysema extending to the inferior coccyx.  1/31 KUB >> Massive pneumoperitoneum again noted. Note that this typically more than  would be expected given laparotomy on 12/14/2020 and an ongoing air leak secondary to visceral perforation should be considered.   Repeat CT imaging with oral contrast may be helpful to identify the source of leak.  Suspected long segment colitis involving the distal descending and sigmoid colon.  2/1 KUB>> stable massive pneumoperitoneum; gastric tube in stomach   Micro Data:  1/20 influenza PCR negative 1/20 COVID PCR positive 1/21 MRSA PCR >> positive  1/31 BCx2 >>  Antimicrobials:  Zosyn 1/19 > 1/24; 1/31 >>  Interim History / Subjective:   On iHD this morning, attempted to pull fluid, goal 1L but unable given rapid change in NE requirements 40mcg-> 15 mcg, plans to keep UF even for now, ?transitioning to CRRT   Objective   Blood pressure 132/77, pulse (!) 53, temperature (!) 97.5 F (36.4 C), temperature source Axillary, resp. rate (!) 21, height 6\' 2"  (1.88 m), weight 68.3 kg, SpO2 99 %.    Vent Mode: PRVC FiO2 (%):  [30 %-40 %] 30 % Set Rate:  [20 bmp] 20 bmp Vt Set:  [570 mL-575 mL] 570 mL PEEP:  [5 cmH20] 5 cmH20 Plateau Pressure:  [12 cmH20-13 cmH20] 12 cmH20   Intake/Output Summary (Last 24 hours) at 01/02/2021 0850 Last data filed at 01/02/2021 0600 Gross per 24 hour  Intake 893.22 ml  Output 565 ml  Net 328.22 ml   Filed Weights   12/14/2020 0405 01/02/21 0202 01/02/21 0700  Weight: 68.3 kg 71.8 kg 68.3 kg    Examination: remains on low dose fentanyl gtt 50 mcg/hr  General:  Thin, cachetic, chronically ill elderly male in NAD on MV HEENT: MM pink/moist, ETT/ OGT, pupils 3/reactive, anicteric  Neuro: Awake, follows some commands, able to wiggle fingers/ toes CV: rr, NSR 89, no murmur  PULM:  Non labored on full MV support, coarse breath sounds GI: midline dressing cdi, JP output 446ml/24hr, left stoma pink, foley scant output Extremities: warm/dry, +3-4 dependent edema in extremities with weeping upper extremities, distal end of left great toe dry ischemia with several dry ulcerations Skin: multiple pressure dressings, sacral wound not visualized   CXR- stable lines/ ETT, unchanged  bibasilar infiltrates, ongoing prominent pneumoperitoneum  UOP 25 ml/ 24hr +351 ml/24hr/ remains net +2.4L Afebrile  Labs reviewed - Na 131, K 5.2, BUN 104, sCr 6.06-> 6.45, Mag 1.9, improving LFTs, Hgb stable 7.8-> 7.7, WBC 10.3-> 10.9, plt 44-> 49  Resolved Hospital Problem list   Large bowel obstruction status post exploratory laparotomy Acute metabolic acidosis COVID 19 infection- off isolation   Assessment & Plan:  Distal colonic micro perforation with pneumoperitoneum - s/p end colostomy and left colectomy 1/31, evidence of ischemic left colon - per CCS - will remain NPO- TPN per pharmacy started 2/1  - follow surgical pathology 1/31 in process - BID dressing changes - Continue NG tube LWIS  Hypotension, hx orthostatic: multifactorial- poor nutrition, chronic hypotension, AI, sedation, rule out intra-abdominal infection given microperforation/ pneumoperitoneum - continue to wean Neo/ NE for MAP goal > 65 - continue stress dose steroids-> solucortef while NPO - trend CBC - follow blood cultures, ngtd - zosyn per pharmacy for 5 days( start date 1/31) - nutrition- TPN as below   Acute hypoxic respiratory insufficiency post-operatively, previously due to volume overload from missing HD session Oppen abdomen - full MV support - CXR / ABG prn - VAP/ PPI  - daily SBT/ WUA - PAD protocol -> fentanyl gtt for pain, minimize as able  - volume removal  per HD    ESRD on TTS (new to HD since 10/2020) - per nephrology, - attempting iHD this am but thus far unable to pull any volume, will likely have to transition to CRRT  - trend renal indices  - continue foley for now, if ongoing low UOP, consider d/c foley > bladder scan, however UOP trend 1L-> 25 ml  Diabetes type 2: - SSI sensitive   PAF w/RVR - remains in NSR - PO amiodarone held - hold betablocker (while on pressors) - no systemic AC given prior GIB  Sacral wound- un-stageable, present on admission - per WOC/ surgery  recommendations, hydrotherapy as tolerated  - maximize nutrition   Anemia- multifactorial, of chronic illness, ABLA - transfused 1 unit 1/29 and 1/30 secondary to BRPR - remains on PPI BID - trend CBC - transfuse for Hgb < 7  Thrombocytopenia  -  Platelets trend stable, continue to monitor/ trend  - transfuse platelets as needed if bleeding  Chronic debility/ deconditioning -  mostly bed bound now, PT/ OT  Protein calorie malnutrition>> severe - TPN per pharmacy   Best practice (evaluated daily)  Diet: NPO, TPN Pain/Anxiety/Delirium protocol (if indicated): minimize- fentanyl gtt VAP protocol (if indicated): yes DVT prophylaxis: SCDs only  GI prophylaxis: PPI  Glucose control: SSI sensitive Mobility: PT/OT Disposition: ICU  Goals of Care:   Code Status: Full code Poor prognosis, Goals of care discussion should be ongoing with family.  Unable to get in touch with daughter Jordan Hawks 2/1 for update. Pending for 2/2.  I have placed a palliative care consult.   Labs   CBC: Recent Labs  Lab 12/30/20 0327 12/17/2020 0325 12/27/2020 1335 12/03/2020 1418 12/18/2020 1724 12/16/2020 1751 01/01/21 0254 01/01/21 1730 01/02/21 0305  WBC 9.2 9.2  --   --  8.4  --  10.3  --  10.9*  NEUTROABS 7.8* 8.1*  --   --  7.7  --  9.5*  --  9.9*  HGB 9.3* 9.4*   < > 8.5* 8.5* 8.8* 7.8*  --  7.7*  HCT 28.1* 28.9*   < > 25.0* 25.9* 26.0* 23.6*  --  24.0*  MCV 92.4 93.2  --   --  92.5  --  92.2  --  94.9  PLT 71* 65*  --   --  55*  51*  --  44* 50* 49*   < > = values in this interval not displayed.    Basic Metabolic Panel: Recent Labs  Lab 12/29/20 0446 12/30/20 0327 12/07/2020 0325 12/18/2020 1335 12/16/2020 1418 12/23/2020 1724 12/24/2020 1751 01/01/21 0254 01/02/21 0305  NA 132* 134* 134*   < > 135 132* 132* 133* 131*  K 4.4 4.3 4.3   < > 4.1 4.2 4.2 4.7 5.2*  CL 100 101 100  --   --  100  --  100 98  CO2 19* 19* 16*  --   --  16*  --  14* 14*  GLUCOSE 115* 122* 122*  --   --  167*  --   131* 216*  BUN 55* 66* 79*  --   --  85*  --  88* 104*  CREATININE 5.05* 5.42* 6.18*  --   --  6.14*  --  6.06* 6.45*  CALCIUM 7.5* 7.6* 7.6*  --   --  7.5*  --  7.3* 7.5*  MG  --  2.1 2.0  --   --   --   --  1.9 1.9  PHOS 4.9*  --   --   --   --   --   --  6.1* 7.2*   < > = values in this interval not displayed.   GFR: Estimated Creatinine Clearance: 10.3 mL/min (A) (by C-G formula based on SCr of 6.45 mg/dL (H)). Recent Labs  Lab 12/29/20 0446 12/29/20 0728 12/03/2020 0325 12/21/2020 1724 01/01/21 0254 01/02/21 0305  WBC 10.1   < > 9.2 8.4 10.3 10.9*  LATICACIDVEN 2.9*  --   --  1.5  --   --    < > = values in this interval not displayed.    Liver Function Tests: Recent Labs  Lab 12/27/20 0214 12/29/20 0446 12/30/20 0327 12/15/2020 0325 01/01/21 0254 01/02/21 0305  AST 30  --  239* 200* 103* 58*  ALT 14  --  175* 161* 108* 86*  ALKPHOS 127*  --  167* 167* 107 120  BILITOT 1.1  --  1.3* 0.8 1.3* 1.2  PROT 4.8*  --  4.6* 4.7* 4.5* 4.6*  ALBUMIN 1.8* 1.6* 1.7* 1.6* 1.7* 1.7*   No results for input(s): LIPASE, AMYLASE in the last 168 hours. No results for input(s): AMMONIA in the last 168 hours.  ABG    Component Value Date/Time   PHART 7.420 12/27/2020 1751   PCO2ART 28.5 (L) 12/07/2020 1751   PO2ART 476 (H) 12/13/2020 1751   HCO3 18.8 (L) 12/27/2020 1751   TCO2 20 (L) 12/08/2020 1751   ACIDBASEDEF 5.0 (H) 12/28/2020 1751   O2SAT 100.0 12/18/2020 1751     Coagulation Profile: Recent Labs  Lab 12/12/2020 0325 12/18/2020 1724 01/01/21 0254 01/01/21 1730 01/02/21 0305  INR 1.1 1.2 1.2 1.1 1.1    Cardiac Enzymes: No results for input(s): CKTOTAL, CKMB, CKMBINDEX, TROPONINI in the last 168 hours.  HbA1C: Hgb A1c MFr Bld  Date/Time Value Ref Range Status  12/21/2020 12:08 PM 4.1 (L) 4.8 - 5.6 % Final    Comment:    (NOTE) Pre diabetes:          5.7%-6.4%  Diabetes:              >6.4%  Glycemic control for   <7.0% adults with diabetes   06/13/2018 03:16  AM 6.6 (H) 4.8 - 5.6 % Final    Comment:    (NOTE) Pre diabetes:          5.7%-6.4% Diabetes:              >6.4% Glycemic control for   <7.0% adults with diabetes     CBG: Recent Labs  Lab 01/01/21 1648 01/01/21 2001 01/02/21 0004 01/02/21 0354 01/02/21 0804  GLUCAP 154* 148* 142* 187* 132*    Critical Care Time:  Total critical care time: 40 minutes  Kennieth Rad, ACNP [facility] 01/02/2021, 8:51 AM

## 2021-01-02 NOTE — Progress Notes (Signed)
    Interdisciplinary Goals of Care Family Meeting   Date carried out:: 01/02/2021  Location of the meeting: Phone conference  Member's involved: Daugher, Teacher, music and myself  Durable Power of Attorney or acting medical decision maker:  Teacher, music, daughter    Discussion:   Called and spoke with [employer], patient's daughter and given update.  We discussed his frequent hospitalizations since October with continued decline and re-addressed patient's failure to progress and that he was not able to tolerate iHD this morning.    We approached the subject of goals of care, however [name] tells me they and more specifically patient had prior discussions with palliative care and his primary team before at [facility] where patient specifically said he wanted everything done.  [name] tells me her father has been bed bound before and came back from that in 2006 and that his brother was diagnosed being brain dead but woke up and recovered therefore he has a strong will/ want for all medical therapies.  [name] acknowledges his poor prognosis but is torn given his prior strong desires to continue full aggressive care, as recent in January.  She is open for further discussions and [place]  Her husband is a PA and hopefully he will come with her or be on the phone.  At this time, she is not ready to consider any change in code status, even limited- no CPR, but open to further discussion on Friday.   I have consulted PMT today.  Hopefully, we can plan / set up a multidisciplinary with PMT, PCCM, CCS and Nephrology meeting on Friday, possibly around noon if possible.     Code status: Full Code  Disposition: Continue current acute care  Time spent for the meeting:  20 mins     Kennieth Rad, ACNP [facility] 01/02/2021, 11:43 AM

## 2021-01-02 NOTE — Progress Notes (Addendum)
2 Days Post-Op    CC: Acute hypoxic respiratory failure status post laparotomy  Subjective: Awake on the vent and dialysis.  Midline looks fine, is clean.  The ostomy looks fine.  It is pink.  There is no gas or fluid in the ostomy bag.  Nursing staff says that her decubitus still has a black area with fair amount of malodorous drainage.   Objective: Vital signs in last 24 hours: Temp:  [97.3 F (36.3 C)-97.8 F (36.6 C)] 97.7 F (36.5 C) (02/02 0700) Pulse Rate:  [50-73] 53 (02/02 0700) Resp:  [8-36] 20 (02/02 0730) BP: (105-169)/(60-103) 105/70 (02/02 0745) SpO2:  [85 %-100 %] 99 % (02/02 0726) Arterial Line BP: (72-162)/(45-70) 90/47 (02/02 0741) FiO2 (%):  [30 %-40 %] 30 % (02/02 0400) Weight:  [68.3 kg-71.8 kg] 68.3 kg (02/02 0700) Last BM Date: 01/02/21 40 p.o. recorded 951 IV Urine 25 NG 110 Drains 465 Afebrile vital signs are stable NA 131, potassium 5.2, glucose 216, creatinine 6.45, AST 58, ALT 86, bilirubin 1.2 WBC 10.9 H/H 7.7/24.0 Platelets 49,000 CXR: Prominent pneumoperitoneum, persistent bibasilar infiltrates without interim change.  Intake/Output from previous day: 02/01 0701 - 02/02 0700 In: 951.2 [P.O.:40; I.V.:776.2; IV Piggyback:135.1] Out: 600 [Urine:25; Emesis/NG output:110; Drains:465] Intake/Output this shift: No intake/output data recorded.  General appearance: alert, cooperative and no distress Resp: clear to auscultation bilaterally GI: Midline incision looks fine.  Wet-to-dry dressings in place.  Ostomy is pink and looks healthy.  There is no gas or fluid in the ostomy bag.  Lab Results:  Recent Labs    01/01/21 0254 01/01/21 1730 01/02/21 0305  WBC 10.3  --  10.9*  HGB 7.8*  --  7.7*  HCT 23.6*  --  24.0*  PLT 44* 50* 49*    BMET Recent Labs    01/01/21 0254 01/02/21 0305  NA 133* 131*  K 4.7 5.2*  CL 100 98  CO2 14* 14*  GLUCOSE 131* 216*  BUN 88* 104*  CREATININE 6.06* 6.45*  CALCIUM 7.3* 7.5*   PT/INR Recent Labs     01/01/21 1730 01/02/21 0305  LABPROT 14.1 14.2  INR 1.1 1.1    Recent Labs  Lab 12/27/20 0214 12/29/20 0446 12/30/20 0327 12/23/2020 0325 01/01/21 0254 01/02/21 0305  AST 30  --  239* 200* 103* 58*  ALT 14  --  175* 161* 108* 86*  ALKPHOS 127*  --  167* 167* 107 120  BILITOT 1.1  --  1.3* 0.8 1.3* 1.2  PROT 4.8*  --  4.6* 4.7* 4.5* 4.6*  ALBUMIN 1.8* 1.6* 1.7* 1.6* 1.7* 1.7*     Lipase     Component Value Date/Time   LIPASE 14 12/07/2020 0040     Medications: . chlorhexidine gluconate (MEDLINE KIT)  15 mL Mouth Rinse BID  . Chlorhexidine Gluconate Cloth  6 each Topical Q0600  . heparin sodium (porcine)      . hydrocortisone sod succinate (SOLU-CORTEF) inj  50 mg Intravenous Q6H  . insulin aspart  0-6 Units Subcutaneous Q4H  . mouth rinse  15 mL Mouth Rinse 10 times per day  . mupirocin ointment   Nasal BID  . pantoprazole (PROTONIX) IV  40 mg Intravenous Q12H  . sodium chloride flush  10-40 mL Intracatheter Q12H   Anti-infectives (From admission, onward)   Start     Dose/Rate Route Frequency Ordered Stop   12/07/2020 1600  piperacillin-tazobactam (ZOSYN) IVPB 2.25 g        2.25 g 100 mL/hr  over 30 Minutes Intravenous Every 8 hours 12/18/2020 1507 01/05/21 1359   12/15/2020 1315  piperacillin-tazobactam (ZOSYN) IVPB 3.375 g  Status:  Discontinued        3.375 g 12.5 mL/hr over 240 Minutes Intravenous To Surgery 12/22/2020 1301 01/01/21 0846   12/26/2020 1500  piperacillin-tazobactam (ZOSYN) IVPB 3.375 g        3.375 g 12.5 mL/hr over 240 Minutes Intravenous Every 12 hours 12/10/2020 1219 12/24/20 2001   12/27/2020 1200  piperacillin-tazobactam (ZOSYN) IVPB 2.25 g  Status:  Discontinued        2.25 g 100 mL/hr over 30 Minutes Intravenous Every 8 hours 12/09/2020 0205 12/09/2020 1219   12/03/2020 0215  piperacillin-tazobactam (ZOSYN) IVPB 3.375 g        3.375 g 100 mL/hr over 30 Minutes Intravenous  Once 12/16/2020 0205 12/02/2020 0435      Assessment/Plan End-stage renal  disease Chronic diarrhea/colitis since 10/2020 Hx orthostatic hypotension Hx PAF-on anticoagulation Covid positive Bedbound Anemia/thrombocytopenia Protein calorie malnutrition -severe(prealbumin<5)   Pneumoperitoneum, pneumatosis, portal venous gas s/p negativeexploratory laparotomy1/20 Dr. Bobbye Morton; POD #13  - intraop: no acute findings, no contamination, excellent blood flow to intestine,congested rectosigmoid colon  -Worsening pneumoperitoneum Exploratory laparotomy, left colectomy, end colostomy, 12/05/2020 Dr. Rolm Bookbinder POD #2   Chronic stage IV sacral decubitus pressure ulcer (12/10/20, first picture [facility]) - wound evaluated today - no indication for emergent surgical debridement but patient would benefit from continuing hydrotherapy for a few more days. I don't think he any further imaging - d/c dakins and continue BID wet to dry dressing changes with saline  ID -zosyn 1/20-1/25, restart 1/31>> day 3 (5 days post op abx) FEN - npo VTE -Heparin (platelets 49K; 01/02/21)  Plan: Appreciate Critical Care management.  He needs to remain n.p.o. until he has return of bowel function.  We would recommend TPN if he were not in end-stage renal disease patient on hemodialysis.  We will continue to follow with you.  Resume hydrotherapy for the decubitus when medically stable.    LOS: 13 days    Dianca Owensby 01/02/2021 Please see Amion

## 2021-01-02 NOTE — Progress Notes (Signed)
PT Cancellation Note  Patient Details Name: [name] MRN: [id number] DOB: Mar 29, 1951   Cancelled Treatment:    Reason Eval/Treat Not Completed: Medical issues which prohibited therapy  Pt remains on vent, sedated with RASS -2. Will consider signing off and await a new order if pt still unable to participate on 2/3.   Arby Barrette, PT Pager [phone]   [name] 01/02/2021, 8:19 AM

## 2021-01-02 NOTE — Progress Notes (Signed)
Physical Therapy Wound Treatment Patient Details  Name: TERRIS GERMANO MRN: [id number] Date of Birth: 10-12-1951  Today's Date: 01/02/2021 Time: 3532-9924 Time Calculation (min): 38 min  Subjective  Subjective: Pt on vent Patient and Family Stated Goals: heal wound Date of Onset:  (unknown - pt reports at least a month) Prior Treatments: unknown  Pain Score: 4/10  Wound Assessment  Pressure Injury 12/05/20 Coccyx Medial;Upper Unstageable - Full thickness tissue loss in which the base of the injury is covered by slough (yellow, tan, gray, green or brown) and/or eschar (tan, brown or black) in the wound bed. prevous stage 2 on sacrum (Active)  Wound Image   01/02/21 1220  Dressing Type ABD;Moist to moist 01/02/21 0130  Dressing Changed;Dry;Clean;New drainage 01/02/21 1220  Dressing Change Frequency Twice a day 01/02/21 1220  State of Healing Early/partial granulation 01/02/21 1220  Site / Wound Assessment Bleeding;Black;Yellow;Red 01/02/21 1220  % Wound base Red or Granulating 30% 01/02/21 1220  % Wound base Yellow/Fibrinous Exudate 50% 01/02/21 1220  % Wound base Black/Eschar 15% 01/02/21 1220  % Wound base Other/Granulation Tissue (Comment) 5% 01/02/21 1220  Peri-wound Assessment Denuded 01/02/21 1220  Wound Length (cm) 10 cm 01/02/21 1220  Wound Width (cm) 8 cm 01/02/21 1220  Wound Depth (cm) 1 cm 01/02/21 1220  Wound Surface Area (cm^2) 80 cm^2 01/02/21 1220  Wound Volume (cm^3) 80 cm^3 01/02/21 1220  Tunneling (cm) 5 cm at 8 o clock 12/26/20 1253  Undermining (cm) 2-3 cm from 3-6, 3 cm 6-12 01/02/21 1220  Margins Unattached edges (unapproximated) 01/02/21 1220  Drainage Amount Moderate 01/02/21 1220  Drainage Description Serosanguineous 01/02/21 1220  Treatment Debridement (Selective);Hydrotherapy (Pulse lavage);Packing (Saline gauze) 01/02/21 1220   Santyl applied to wound bed prior to applying dressing.    Hydrotherapy Pulsed lavage therapy - wound location: sacrum Pulsed  Lavage with Suction (psi): 12 psi (4-12) Pulsed Lavage with Suction - Normal Saline Used: 1000 mL Pulsed Lavage Tip: Tip with splash shield Selective Debridement Selective Debridement - Location: sacrum Selective Debridement - Tools Used: Scalpel;Scissors;Forceps Selective Debridement - Tissue Removed: eschar, brown slough, black necrotic edges   Wound Assessment and Plan  Wound Therapy - Assess/Plan/Recommendations Wound Therapy - Clinical Statement: Decreased odor today, but skin surrounding wound continues to deteriorate. Continued yellow slough removal from undermining areas of wound, bleeding easily with increased drainage. Will continue hydro and selective debridement to both clean up necrosis and decrease the bioburden. Wound Therapy - Functional Problem List: global weakness and immobility Factors Delaying/Impairing Wound Healing: Immobility;Multiple medical problems Hydrotherapy Plan: Debridement;Dressing change;Patient/family education;Pulsatile lavage with suction Wound Therapy - Frequency: 6X / week Wound Therapy - Follow Up Recommendations: Skilled nursing facility Wound Plan: [name] d/c'd, but discharged held to continue debridement and management of notable infection.  Wound Therapy Goals- Improve the function of patient's integumentary system by progressing the wound(s) through the phases of wound healing (inflammation - proliferation - remodeling) by: Decrease Necrotic Tissue to: 75 Decrease Necrotic Tissue - Progress: Progressing toward goal Increase Granulation Tissue to: 25 Increase Granulation Tissue - Progress: Progressing toward goal Goals/treatment plan/discharge plan were made with and agreed upon by patient/family: Yes Time For Goal Achievement: 7 days Wound Therapy - Potential for Goals: Good  Goals will be updated until maximal potential achieved or discharge criteria met.  Discharge criteria: when goals achieved, discharge from hospital, MD decision/surgical  intervention, no progress towards goals, refusal/missing three consecutive treatments without notification or medical reason.  GP  Wyona Almas, PT, DPT Acute Rehabilitation  Services Pager [phone] Office [place]de Brown 01/02/2021, 12:25 PM

## 2021-01-02 NOTE — Progress Notes (Signed)
OT Cancellation Note  Patient Details Name: [name] MRN: [id number] DOB: [birth year]   Cancelled Treatment:    Reason Eval/Treat Not Completed: Patient not medically ready. Pt remains on vent, RN asked to hold. Will check back tomorrow, if another consecutive cancel occurs we will consider discharging from OT caseload and request re-order when medically appropriate.  Merri Ray Roemello Speyer 01/02/2021, 8:13 AM   Jesse Sans OTR/L Acute Rehabilitation Services Pager: [phone] Office: [phone]

## 2021-01-02 NOTE — Progress Notes (Signed)
PHARMACY - TOTAL PARENTERAL NUTRITION CONSULT NOTE   Indication: Massive bowel resection and severe malnutrition  Patient Measurements: Height: 6\' 2"  (188 cm) Weight: 68.3 kg (150 lb 9.2 oz) IBW/kg (Calculated) : 82.2 TPN AdjBW (KG): 69.2 Body mass index is 19.33 kg/m.  Assessment: 70 year old male with diabetes, ESRDon HD, TTS,chronic diarrhea, chronic hypotension, PAF - systemic AC on hold w/hx of GIB, admitted 1/19 from SNF with N/V and abd pain found to have pneumoperitoneum. Underwent laparotomy 1/20 without findings of bowel perforation. Incidentally COVID positive. He was transferred to [facility] on 1/22. He was doing better however his LFTs elevated on 1/30 and KUB 1/31 showed worsening intraperitoneal air. Of note he has previously had multiple ulcers in the sigmoid colon of nonspecific etiology. He was taken back to the OR 1/31 and underwent exploratory laparotomy with end colostomy and left colectomy- noting that entire left colon was boggy thought secondary to ischemia; also noted to have ulcerated area on anterior rectum. Pharmacy consulted for TPN.   Glucose / Insulin: CBGs/12h since TPN initiated 148-216. On ICU SSI, also noted to be on stress steroids  Electrolytes: Na 131, K 5.2 (removed in TPN), HCO3 14, Phos 7.2 (removed in TPN), CoCa. CoCa 9.3, Mg 1.9 Renal: ESRD - receiving HD 2/2 AM  LFTs / TGs: LFTs elevated- trending down (58/86), TBili 1.2 Prealbumin / albumin: Pre-albumin <5.  Intake / Output; MIVF: Requiring low-dose Levophed. Propofol weaned off. Fluid removal w/ HD today - planned 1L, minimal UOP today. Net +2L. LBM 2/2. GI Imaging: none since start of TPN Surgeries / Procedures: none since start of TPN  Central access: LIJ triple lumen CVC placed 01/01/21 TPN start date: 01/01/21  Nutritional Goals(per RD recommendation on 2/1) Kcal:1785-2000 kcals; Protein:100-120 g; Fluid:1000 mL plus UOP Goal rate:  ~65 cc/hr with ILE 50g on MWF only will provide an  average of 1812 kcal and 110g protein per day  Current Nutrition:  NPO  Plan:  Increase concentrated TPN to 77mL/hr at 1800 Given shortage of ILE - will provide lipids on MWF only - to start with TPN today (2/2) Electrolytes in TPN: increase to 46mEq/L of Na, 48mEq/L of K, 55mEq/L of Ca, add 2 mEq/L of Mg, and 23mmol/L of Phos. Maximize Acetate Add standard MVI and trace elements to TPN Adjust to from very sensitive to sensitive SSI q4h - will plan to adjust adjust as needed  Monitor TPN labs on Mon/Thurs, daily for first several days  Thank you for allowing pharmacy to be a part of this patient's care.  Alycia Rossetti, PharmD, BCPS Clinical Pharmacist Clinical phone for 01/02/2021: [phone] 01/02/2021 8:43 AM   **Pharmacist phone directory can now be found on [email or web address] ([email or web address]).  Listed under [place].

## 2021-01-02 NOTE — Procedures (Addendum)
I was present at this dialysis session. I have reviewed the session itself and made appropriate changes.  BP dropped immediately after turning on UF so will keep even.  Concerned about his ability to heal given edema and will likely need to transition to CRRT but would recommend palliative care consult before escalating care further.   Vital signs in last 24 hours:  Temp:  [97.3 F (36.3 C)-97.8 F (36.6 C)] 97.5 F (36.4 C) (02/02 0807) Pulse Rate:  [50-85] 85 (02/02 0900) Resp:  [8-34] 20 (02/02 0915) BP: (105-169)/(60-103) 123/76 (02/02 0915) SpO2:  [85 %-100 %] 98 % (02/02 0915) Arterial Line BP: (72-162)/(44-70) 83/59 (02/02 0930) FiO2 (%):  [30 %-40 %] 30 % (02/02 0800) Weight:  [68.3 kg-71.8 kg] 68.3 kg (02/02 0700) Weight change:  Filed Weights   12/28/2020 0405 01/02/21 0202 01/02/21 0700  Weight: 68.3 kg 71.8 kg 68.3 kg    Recent Labs  Lab 01/02/21 0305  NA 131*  K 5.2*  CL 98  CO2 14*  GLUCOSE 216*  BUN 104*  CREATININE 6.45*  CALCIUM 7.5*  PHOS 7.2*    Recent Labs  Lab 12/02/2020 1724 12/09/2020 1751 01/01/21 0254 01/01/21 1730 01/02/21 0305  WBC 8.4  --  10.3  --  10.9*  NEUTROABS 7.7  --  9.5*  --  9.9*  HGB 8.5* 8.8* 7.8*  --  7.7*  HCT 25.9* 26.0* 23.6*  --  24.0*  MCV 92.5  --  92.2  --  94.9  PLT 55*  51*  --  44* 50* 49*    Scheduled Meds: . chlorhexidine gluconate (MEDLINE KIT)  15 mL Mouth Rinse BID  . Chlorhexidine Gluconate Cloth  6 each Topical Q0600  . heparin sodium (porcine)      . hydrocortisone sod succinate (SOLU-CORTEF) inj  50 mg Intravenous Q6H  . insulin aspart  0-6 Units Subcutaneous Q4H  . mouth rinse  15 mL Mouth Rinse 10 times per day  . mupirocin ointment   Nasal BID  . pantoprazole (PROTONIX) IV  40 mg Intravenous Q12H  . sodium chloride flush  10-40 mL Intracatheter Q12H   Continuous Infusions: . fentaNYL infusion INTRAVENOUS 50 mcg/hr (01/02/21 0600)  . norepinephrine (LEVOPHED) Adult infusion 3 mcg/min (01/02/21 0600)   . piperacillin-tazobactam (ZOSYN)  IV 2.25 g (01/02/21 0602)  . propofol (DIPRIVAN) infusion Stopped (01/01/21 1316)  . TPN ADULT (ION) 25 mL/hr at 01/02/21 0600   PRN Meds:.fentaNYL, heparin, midazolam, midazolam, sodium chloride flush     OP HD: TTS [facility] 66.5kg dry wt per OP HD RN/ R IJ TDC   Assessment/Plan:  1. Pneumoperitoneum/ shock -sp exlap, no perforated viscous found.Abd films today with worsening pneumoperitoneum and s/p left colectomy/colostomy 12/14/2020. 2. VDRF- currently on vent post op per PCCM 3. ESRD - on HD TTS. Not able to UF with IHD and will likely need to start CRRT, however would recommend palliative care consult for goals of care before escalating his care. 4. Covid positive - therapies per primary team 5. Orthostatic hypotension - chronic issue, pt is bed-bound it appears and does stretcher dialysis w/ the group in [facility]. On midodrine and fluorinef outpatient per charting.  6. BP /volume - has sig LE/ UE edema, up 1-2kg by wt's, no resp issues. Unable to UF large amts due to low alb/ 3rd spacing of fluid.  7. DM2 - per primary team  8. Anemia ckd - no emergent need for PRBC's. need records re: ESA  dosing.  9. Hypocalcemia - repleted; added ca bath 10. Prognosis - very poorand has been seen by Palliative care in the past and no evidence of improvement.  He has severe autonomic dysfunction and is predominantly bed bound and can only get dialysis on a stretcher.  Now with perforated colon s/p colectomy/colostomy, ongoing hypotension and hypoalbuminemia with poor chance of meaningful recovery or healing.  Will reconsult for possible transition to comfort care.   Donetta Potts,  MD 01/02/2021, 9:30 AM

## 2021-01-03 DIAGNOSIS — K631 Perforation of intestine (nontraumatic): Secondary | ICD-10-CM | POA: Diagnosis not present

## 2021-01-03 DIAGNOSIS — K668 Other specified disorders of peritoneum: Secondary | ICD-10-CM | POA: Diagnosis not present

## 2021-01-03 DIAGNOSIS — J9601 Acute respiratory failure with hypoxia: Secondary | ICD-10-CM | POA: Diagnosis not present

## 2021-01-03 LAB — CBC
HCT: 24.5 % — ABNORMAL LOW (ref 39.0–52.0)
Hemoglobin: 8.2 g/dL — ABNORMAL LOW (ref 13.0–17.0)
MCH: 31.2 pg (ref 26.0–34.0)
MCHC: 33.5 g/dL (ref 30.0–36.0)
MCV: 93.2 fL (ref 80.0–100.0)
Platelets: 38 10*3/uL — ABNORMAL LOW (ref 150–400)
RBC: 2.63 MIL/uL — ABNORMAL LOW (ref 4.22–5.81)
RDW: 18.1 % — ABNORMAL HIGH (ref 11.5–15.5)
WBC: 11.9 10*3/uL — ABNORMAL HIGH (ref 4.0–10.5)
nRBC: 0 % (ref 0.0–0.2)

## 2021-01-03 LAB — COMPREHENSIVE METABOLIC PANEL
ALT: 61 U/L — ABNORMAL HIGH (ref 0–44)
AST: 33 U/L (ref 15–41)
Albumin: 1.6 g/dL — ABNORMAL LOW (ref 3.5–5.0)
Alkaline Phosphatase: 99 U/L (ref 38–126)
Anion gap: 16 — ABNORMAL HIGH (ref 5–15)
BUN: 81 mg/dL — ABNORMAL HIGH (ref 8–23)
CO2: 17 mmol/L — ABNORMAL LOW (ref 22–32)
Calcium: 7.1 mg/dL — ABNORMAL LOW (ref 8.9–10.3)
Chloride: 99 mmol/L (ref 98–111)
Creatinine, Ser: 4.92 mg/dL — ABNORMAL HIGH (ref 0.61–1.24)
GFR, Estimated: 12 mL/min — ABNORMAL LOW (ref >60)
Glucose, Bld: 237 mg/dL — ABNORMAL HIGH (ref 70–99)
Potassium: 4 mmol/L (ref 3.5–5.1)
Sodium: 132 mmol/L — ABNORMAL LOW (ref 135–145)
Total Bilirubin: 0.9 mg/dL (ref 0.3–1.2)
Total Protein: 4.3 g/dL — ABNORMAL LOW (ref 6.5–8.1)

## 2021-01-03 LAB — PROTIME-INR
INR: 1.2 (ref 0.8–1.2)
Prothrombin Time: 15 seconds (ref 11.4–15.2)

## 2021-01-03 LAB — GLUCOSE, CAPILLARY
Glucose-Capillary: 113 mg/dL — ABNORMAL HIGH (ref 70–99)
Glucose-Capillary: 181 mg/dL — ABNORMAL HIGH (ref 70–99)
Glucose-Capillary: 223 mg/dL — ABNORMAL HIGH (ref 70–99)
Glucose-Capillary: 240 mg/dL — ABNORMAL HIGH (ref 70–99)
Glucose-Capillary: 248 mg/dL — ABNORMAL HIGH (ref 70–99)
Glucose-Capillary: 333 mg/dL — ABNORMAL HIGH (ref 70–99)

## 2021-01-03 LAB — RENAL FUNCTION PANEL
Albumin: 1.8 g/dL — ABNORMAL LOW (ref 3.5–5.0)
Anion gap: 17 — ABNORMAL HIGH (ref 5–15)
BUN: 69 mg/dL — ABNORMAL HIGH (ref 8–23)
CO2: 17 mmol/L — ABNORMAL LOW (ref 22–32)
Calcium: 7.3 mg/dL — ABNORMAL LOW (ref 8.9–10.3)
Chloride: 99 mmol/L (ref 98–111)
Creatinine, Ser: 3.88 mg/dL — ABNORMAL HIGH (ref 0.61–1.24)
GFR, Estimated: 16 mL/min — ABNORMAL LOW (ref >60)
Glucose, Bld: 269 mg/dL — ABNORMAL HIGH (ref 70–99)
Phosphorus: 4 mg/dL (ref 2.5–4.6)
Potassium: 3.9 mmol/L (ref 3.5–5.1)
Sodium: 133 mmol/L — ABNORMAL LOW (ref 135–145)

## 2021-01-03 LAB — CBC WITH DIFFERENTIAL/PLATELET
Abs Immature Granulocytes: 0.09 10*3/uL — ABNORMAL HIGH (ref 0.00–0.07)
Basophils Absolute: 0 10*3/uL (ref 0.0–0.1)
Basophils Relative: 0 %
Eosinophils Absolute: 0 10*3/uL (ref 0.0–0.5)
Eosinophils Relative: 0 %
HCT: 19.1 % — ABNORMAL LOW (ref 39.0–52.0)
Hemoglobin: 6.3 g/dL — CL (ref 13.0–17.0)
Immature Granulocytes: 1 %
Lymphocytes Relative: 4 %
Lymphs Abs: 0.4 10*3/uL — ABNORMAL LOW (ref 0.7–4.0)
MCH: 31.5 pg (ref 26.0–34.0)
MCHC: 33 g/dL (ref 30.0–36.0)
MCV: 95.5 fL (ref 80.0–100.0)
Monocytes Absolute: 0.5 10*3/uL (ref 0.1–1.0)
Monocytes Relative: 5 %
Neutro Abs: 10.6 10*3/uL — ABNORMAL HIGH (ref 1.7–7.7)
Neutrophils Relative %: 90 %
Platelets: 36 10*3/uL — ABNORMAL LOW (ref 150–400)
RBC: 2 MIL/uL — ABNORMAL LOW (ref 4.22–5.81)
RDW: 18.7 % — ABNORMAL HIGH (ref 11.5–15.5)
WBC: 11.6 10*3/uL — ABNORMAL HIGH (ref 4.0–10.5)
nRBC: 0 % (ref 0.0–0.2)

## 2021-01-03 LAB — MAGNESIUM: Magnesium: 1.8 mg/dL (ref 1.7–2.4)

## 2021-01-03 LAB — PHOSPHORUS: Phosphorus: 5.2 mg/dL — ABNORMAL HIGH (ref 2.5–4.6)

## 2021-01-03 LAB — PREPARE RBC (CROSSMATCH)

## 2021-01-03 MED ORDER — TRACE MINERALS CU-MN-SE-ZN 300-55-60-3000 MCG/ML IV SOLN
INTRAVENOUS | Status: AC
  Filled 2021-01-03: qty 733.2

## 2021-01-03 MED ORDER — ALTEPLASE 2 MG IJ SOLR
2.0000 mg | Freq: Once | INTRAMUSCULAR | Status: DC
  Filled 2021-01-03: qty 2

## 2021-01-03 MED ORDER — PIPERACILLIN-TAZOBACTAM 3.375 G IVPB 30 MIN
3.3750 g | Freq: Four times a day (QID) | INTRAVENOUS | Status: AC
  Administered 2021-01-03 – 2021-01-05 (×8): 3.375 g via INTRAVENOUS
  Filled 2021-01-03 (×8): qty 50

## 2021-01-03 MED ORDER — PRISMASOL BGK 4/2.5 32-4-2.5 MEQ/L REPLACEMENT SOLN
Status: DC
  Filled 2021-01-03 (×9): qty 5000

## 2021-01-03 MED ORDER — SODIUM CHLORIDE 0.9 % FOR CRRT: INTRAVENOUS_CENTRAL | Status: DC | PRN

## 2021-01-03 MED ORDER — PRISMASOL BGK 4/2.5 32-4-2.5 MEQ/L REPLACEMENT SOLN
Status: DC
  Filled 2021-01-03 (×6): qty 5000

## 2021-01-03 MED ORDER — HEPARIN SODIUM (PORCINE) 1000 UNIT/ML DIALYSIS
1000.0000 [IU] | INTRAMUSCULAR | Status: DC | PRN
Start: 2021-01-03 — End: 2021-01-09
  Administered 2021-01-03: 3500 [IU] via INTRAVENOUS_CENTRAL
  Administered 2021-01-05 – 2021-01-07 (×3): 3800 [IU] via INTRAVENOUS_CENTRAL
  Filled 2021-01-03: qty 4
  Filled 2021-01-03 (×2): qty 6
  Filled 2021-01-03: qty 3
  Filled 2021-01-03: qty 6
  Filled 2021-01-03: qty 4
  Filled 2021-01-03 (×2): qty 6
  Filled 2021-01-03: qty 1

## 2021-01-03 MED ORDER — SODIUM CHLORIDE 0.9% IV SOLUTION: Freq: Once | INTRAVENOUS | Status: AC

## 2021-01-03 MED ORDER — ASCORBIC ACID 500 MG PO TABS
500.0000 mg | ORAL_TABLET | Freq: Every day | ORAL | Status: DC
  Administered 2021-01-03 – 2021-01-08 (×6): 500 mg via ORAL
  Filled 2021-01-03 (×6): qty 1

## 2021-01-03 MED ORDER — PIPERACILLIN-TAZOBACTAM 3.375 G IVPB: 3.3750 g | Freq: Four times a day (QID) | INTRAVENOUS | Status: DC

## 2021-01-03 MED ORDER — ZINC SULFATE 220 (50 ZN) MG PO CAPS
220.0000 mg | ORAL_CAPSULE | Freq: Every day | ORAL | Status: DC
  Administered 2021-01-03 – 2021-01-08 (×6): 220 mg via ORAL
  Filled 2021-01-03 (×6): qty 1

## 2021-01-03 MED ORDER — PRISMASOL BGK 4/2.5 32-4-2.5 MEQ/L EC SOLN
Status: DC
  Filled 2021-01-03 (×18): qty 5000

## 2021-01-03 NOTE — Progress Notes (Signed)
Assessment of [place] and CVC completed. Distal lumen of CVC flushed with brisk blood return. [place] assessed due to continued alarms with CRRT. Both lumens of [place] are positional with inspiration and head positioning. TPA would not be effective in resolving this. Discussed findings with nurse. Further interventions may need to be determined by MD.

## 2021-01-03 NOTE — Progress Notes (Signed)
Physical Therapy Wound Treatment Patient Details  Name: [name] MRN: [id number] Date of Birth: 10-31-1951  Today's Date: 01/03/2021 Time: 3818-2993 Time Calculation (min): 36 min  Subjective  Subjective: Pt on vent Patient and Family Stated Goals: heal wound Date of Onset:  (unknown - pt reports at least a month) Prior Treatments: unknown  Pain Score:    Wound Assessment  Pressure Injury 12/05/20 Coccyx Medial;Upper Unstageable - Full thickness tissue loss in which the base of the injury is covered by slough (yellow, tan, gray, green or brown) and/or eschar (tan, brown or black) in the wound bed. prevous stage 2 on sacrum (Active)  Wound Image   01/02/21 1220  Dressing Type ABD;Barrier Film (skin prep);Gauze (Comment);Moist to dry;Santyl 01/03/21 1413  Dressing Changed;Dry;Intact 01/03/21 1413  Dressing Change Frequency Twice a day 01/03/21 1413  State of Healing Eschar 01/03/21 1413  Site / Wound Assessment Black;Brown;Yellow;Red 01/03/21 1413  % Wound base Red or Granulating 30% 01/03/21 1413  % Wound base Yellow/Fibrinous Exudate 60% 01/03/21 1413  % Wound base Black/Eschar 5% 01/03/21 1413  % Wound base Other/Granulation Tissue (Comment) 5% 01/03/21 1413  Peri-wound Assessment Denuded;Erythema (blanchable);Erythema (non-blanchable);Purple 01/03/21 1413  Wound Length (cm) 10 cm 01/02/21 1220  Wound Width (cm) 8 cm 01/02/21 1220  Wound Depth (cm) 1 cm 01/02/21 1220  Wound Surface Area (cm^2) 80 cm^2 01/02/21 1220  Wound Volume (cm^3) 80 cm^3 01/02/21 1220  Tunneling (cm) 5 cm at 8 o clock 12/26/20 1253  Undermining (cm) 2-3 cm from 3-6, 3 cm 6-12 01/02/21 1220  Margins Unattached edges (unapproximated) 01/03/21 0130  Drainage Amount Moderate 01/03/21 1413  Drainage Description Purulent;Sanguineous 01/03/21 1413  Treatment Cleansed;Debridement (Selective);Hydrotherapy (Pulse lavage);Packing (Saline gauze) 01/03/21 1413   Santyl applied to wound bed prior to applying  dressing.    Hydrotherapy Pulsed lavage therapy - wound location: sacrum Pulsed Lavage with Suction (psi): 12 psi (4-12) Pulsed Lavage with Suction - Normal Saline Used: 1000 mL Pulsed Lavage Tip: Tip with splash shield Selective Debridement Selective Debridement - Location: sacrum Selective Debridement - Tools Used: Scalpel;Scissors;Forceps Selective Debridement - Tissue Removed: eschar, brown slough, black necrotic edges   Wound Assessment and Plan  Wound Therapy - Assess/Plan/Recommendations Wound Therapy - Clinical Statement: Copious amounts of purulence noted from area of undermining 7-12 occlock, skin surrounding wound continues to deteriorate, debrided extensively. Continued yellow slough removal from undermining areas of wound, bleeding easily with increased drainage. Will continue hydro and selective debridement to both clean up necrosis and decrease the bioburden. Wound Therapy - Functional Problem List: global weakness and immobility Factors Delaying/Impairing Wound Healing: Immobility;Multiple medical problems Hydrotherapy Plan: Debridement;Dressing change;Patient/family education;Pulsatile lavage with suction Wound Therapy - Frequency: 6X / week Wound Therapy - Follow Up Recommendations: Skilled nursing facility Wound Plan: [name] d/c'd, but discharged held to continue debridement and management of notable infection.  Wound Therapy Goals- Improve the function of patient's integumentary system by progressing the wound(s) through the phases of wound healing (inflammation - proliferation - remodeling) by: Decrease Necrotic Tissue to: 40 Decrease Necrotic Tissue - Progress: Goal set today Increase Granulation Tissue to: 60 Increase Granulation Tissue - Progress: Goal set today Goals/treatment plan/discharge plan were made with and agreed upon by patient/family: Yes Time For Goal Achievement: 7 days Wound Therapy - Potential for Goals: Good  Goals will be updated until maximal  potential achieved or discharge criteria met.  Discharge criteria: when goals achieved, discharge from hospital, MD decision/surgical intervention, no progress towards goals, refusal/missing three consecutive treatments  without notification or medical reason.  GP    01/03/2021  Ginger Carne., PT Acute Rehabilitation Services [phone]  (pager) [phone]  (office) [name] Eduarda Scrivens 01/03/2021, 2:21 PM

## 2021-01-03 NOTE — Progress Notes (Signed)
Difficulty restarting CRRT after first filter clotted prematurely due to HD catheter not functioning correctly. IV team RN did come to bedside to assess catheter, however, after her assessment she concluded that the catheter does not need TPA and it is positional. DR. Joelyn Oms was paged and made aware about the issue. He gave verbal order to hold off on CRRT for tonight and will re-assess in the morning.

## 2021-01-03 NOTE — Progress Notes (Addendum)
Pharmacy Antibiotic Note  [name] is a 70 y.o. male admitted on 12/07/2020 with pneumoperitonem s/p colectomy/colostomy on 1/31. Patient to complete 5 days pip/tazo for intra-abdominal coverage per surgery/CCM.  Pharmacy has been consulted for pip/tazo dosing.  Patient transitioned to CRRT today per nephrology to promote wound healing; previously on HD but unable to successfully undergo UF. Will transition to pip/tazo 3.375g q6hr. To complete 5 days therapy on 2/5.  Plan: Pip/tazo 3.375g q6hr  Height: 6\' 2"  (188 cm) Weight: 70 kg (154 lb 5.2 oz) IBW/kg (Calculated) : 82.2  Temp (24hrs), Avg:96.1 F (35.6 C), Min:90.32 F (32.4 C), Max:98 F (36.7 C)  Recent Labs  Lab 12/29/20 0446 12/29/20 0728 12/09/2020 0325 12/13/2020 1724 01/01/21 0254 01/02/21 0305 01/03/21 0301 01/03/21 0439 01/03/21 1308  WBC 10.1   < > 9.2 8.4 10.3 10.9*  --  11.6* 11.9*  CREATININE 5.05*   < > 6.18* 6.14* 6.06* 6.45* 4.92*  --   --   LATICACIDVEN 2.9*  --   --  1.5  --   --   --   --   --    < > = values in this interval not displayed.    Estimated Creatinine Clearance: 13.8 mL/min (A) (by C-G formula based on SCr of 4.92 mg/dL (H)).    No Known Allergies  Antimicrobials this admission: Zosyn 1/20>>1/24, restart 1/31>>(2/5)  Thank you for allowing pharmacy to be a part of this patient's care.  Esmond Plants 01/03/2021 2:57 PM

## 2021-01-03 NOTE — Progress Notes (Signed)
eLink Physician-Brief Progress Note Patient Name: PAIDEN CAVELL DOB: 05/03/1951 MRN: [id number]   Date of Service  01/03/2021  HPI/Events of Note  Hemoglobin 6.3 gm / dl.  eICU Interventions  One unit of PRBC ordered transfused. Patient has a consent on the chart from 12/29/20.        Kerry Kass Zanyia Silbaugh 01/03/2021, 6:14 AM

## 2021-01-03 NOTE — Progress Notes (Signed)
Initial Nutrition Assessment  DOCUMENTATION CODES:   Severe malnutrition in context of chronic illness  INTERVENTION:    TPN dosing per Pharmacy (increased needs for CRRT).  Supplement vitamin C and zinc.   NUTRITION DIAGNOSIS:   Severe Malnutrition related to chronic illness (ESRD on HD) as evidenced by severe muscle depletion,severe fat depletion.  Ongoing   GOAL:   Patient will meet greater than or equal to 90% of their needs  Progressing with TPN initiation  MONITOR:   Vent status,Labs,Skin,I & O's  REASON FOR ASSESSMENT:   Ventilator,Consult New TPN/TNA  ASSESSMENT:   70 yo male admitted with with pneumoperitoneum, shock, acute metabolic acidosis, respiratory failure due to volume overload- ESRD/HD (started 10/2020), Pt missed HD x 2 prior to admission.Pt also COVID+.  PMH includes DM, unstageable sacral PI  S/P exploratory laparotomy, left colectomy, end colostomy 1/31.  TPN initiated 2/1 with lipids provided only on M-W-F due to ILE shortage.  TPN increasing to 65 ml/h at 1800 to provide 1633 kcal and 110 gm protein. Receiving lipids three times per week. Received 30.8 gm SMOF lipids in TPN yesterday.  Starting CRRT today to help with UF and healing of unstageable sacral ulcer.  Patient remains intubated on ventilator support MV: 11.3 L/min Temp (24hrs), Avg:97.5 F (36.4 C), Min:96.4 F (35.8 C), Max:98 F (36.7 C)   Labs reviewed. Na 132, BUN 81, Creat 4.92, phos 5.2 CBG: 181-223 Labs 1/21: Vitamin C < 0.1 (L) Vitamin A 6 (L) Zinc 42 (L)  Medications reviewed and include solucortef, novolog, protonix, levophed. Vitamin C and zinc supplementation beginning today.  Admission weight 61.2 kg, current weight 70 kg I/O +4.65 L JP drain 225 ml output x 24 hours UOP 0 ml x 24 hours  Diet Order:   Diet Order            Diet NPO time specified Except for: Sips with Meds  Diet effective now                 EDUCATION NEEDS:   Not appropriate  for education at this time  Skin:  Skin Assessment: Skin Integrity Issues: Skin Integrity Issues:: Other (Comment),Incisions Unstageable: sacrum Incisions: abdomen Other: L great toe ulcer  Last BM:  2/3 type 6; no documented output from colostomy   Height:   Ht Readings from Last 1 Encounters:  01/01/21 6\' 2"  (1.88 m)    Weight:   Wt Readings from Last 1 Encounters:  01/03/21 70 kg    Ideal Body Weight:  86.4 kg  BMI:  Body mass index is 19.81 kg/m.  Estimated Nutritional Needs:   Kcal:  1900-2200  Protein:  140-170 gm  Fluid:  1000 mL plus UOP   Lucas Mallow, RD, LDN, CNSC Please refer to Amion for contact information.

## 2021-01-03 NOTE — Progress Notes (Signed)
PHARMACY - TOTAL PARENTERAL NUTRITION CONSULT NOTE   Indication: Massive bowel resection and severe malnutrition  Patient Measurements: Height: 6\' 2"  (188 cm) Weight: 70 kg (154 lb 5.2 oz) IBW/kg (Calculated) : 82.2 TPN AdjBW (KG): 69.2 Body mass index is 19.81 kg/m.  Assessment: 70 year old male with diabetes, ESRDon HD, TTS,chronic diarrhea, chronic hypotension, PAF - systemic AC on hold w/hx of GIB, admitted 1/19 from SNF with N/V and abd pain found to have pneumoperitoneum. Underwent laparotomy 1/20 without findings of bowel perforation. Incidentally COVID positive. He was transferred to [facility] on 1/22. He was doing better however his LFTs elevated on 1/30 and KUB 1/31 showed worsening intraperitoneal air. Of note he has previously had multiple ulcers in the sigmoid colon of nonspecific etiology. He was taken back to the OR 1/31 and underwent exploratory laparotomy with end colostomy and left colectomy- noting that entire left colon was boggy thought secondary to ischemia; also noted to have ulcerated area on anterior rectum. Pharmacy consulted for TPN.   Glucose / Insulin: CBGs/12h since TPN initiated 166-237. 11 units sensitive SSI. Also noted to be on stress steroids  Electrolytes: Na 132, K 4 (removed in TPN), HCO3 17, Phos 5.2 (removed in TPN), CoCa 9.3, Mg 1.8 Renal: ESRD - receiving HD 2/2 AM  LFTs / TGs: LFTs elevated- trending down (58/86), TBili 1.2 Prealbumin / albumin: Pre-albumin <5/ albumin 1.6  Intake / Output; MIVF: Requiring low-dose Levophed. Propofol weaned off. Fluid removal w/ HD today - planned 1L, minimal UOP today. Net +3L. LBM 2/2. GI Imaging: none since start of TPN Surgeries / Procedures: none since start of TPN  Central access: LIJ triple lumen CVC placed 01/01/21 TPN start date: 01/01/21  Nutritional Goals(per RD recommendation on 2/1) Kcal:1785-2000 kcals; Protein:100-120 g; Fluid:1000 mL plus UOP Goal rate:  ~65 cc/hr with ILE 50g on MWF only  will provide an average of 1812 kcal and 110g protein per day  Current Nutrition:  NPO  Plan:  Increase concentrated TPN to 30mL/hr at 1800 Given shortage of ILE - will provide lipids on MWF only Electrolytes in TPN: increase to 1mEq/L of Na, 68mEq/L of K, 77mEq/L of Ca, add 4 mEq/L of Mg, and 51mmol/L of Phos. Maximize Acetate Add standard MVI and trace elements to TPN Sensitive SSI q4h - will plan to adjust as needed  Monitor TPN labs on Mon/Thurs, daily for first several days  Thank you for allowing pharmacy to be a part of this patient's care.  Alanda Slim, PharmD, [facility] Clinical Pharmacist Please see AMION for all Pharmacists' Contact Phone Numbers 01/03/2021, 7:11 AM

## 2021-01-03 NOTE — Progress Notes (Signed)
NAME:  [name], MRN:  [id number], DOB:  1951-04-19, LOS: 51 ADMISSION DATE:  12/28/2020, CONSULTATION DATE: 12/28/2020 REFERRING MD:  Jesusita Oka, MD, CHIEF COMPLAINT: Acute hypoxic respiratory failure post laparotomy  Brief History:  70 year old male with end-stage renal disease on dialysis TTS, admitted with nausea, vomiting, and abdominal pain, noted to have pneumoperitoneum.  Underwent laparotomy 1/20 which was negative.  Incidentally found to be COVID-positive. On 1/30, his LFTs started to elevate and KUB showed worsening intraperitoneal air.  He was taken back to OR 1/31 for ex lap with end colostomy and left colectomy.  Returns to ICU on two vasopressors and remains intubated secondary to hemodynamic instability.   History of Present Illness:  70 year old male with diabetes, ESRD on HD, TTS, chronic diarrhea, chronic hypotension, PAF - systemic AC on hold w/hx of GIB, admitted 1/19 from SNF with N/V and abd pain found to have pneumoperitoneum.   Prior to this he had prolonged hospitalization 12/3- 1/10  at [facility] with diarrhea, dehydration/ hypotension, deconditioning being bed bound with sacral decub, then previously 11/12- 12/2 with syncope and colitis.   Underwent laparotomy 1/20 without findings of bowel perforation.  Incidentally COVID positive.  He was transferred to [facility] on 1/22.  He was doing better however his LFTs elevated on 1/30 and KUB 1/31 showed worsening intraperitoneal air.  Of note he has previously had multiple ulcers in the sigmoid colon of nonspecific etiology.  He was taken back to the OR today, 1/31 and underwent exploratory laparotomy with end colostomy and left colectomy- noting that entire left colon was boggy thought secondary to ischemia; also noted to have ulcerated area on anterior rectum.  Post operative, he returns to ICU on two vasopressors and remains intubated due to hemodynamic instability. EBL 50 ml.  PCCM re-consulted for ICU management.   Past Medical  History:  End-stage renal disease on dialysis Diabetes type 2 Paroxysmal A. fib Orthostatic hypotension Unstageable sacral decubitus ulcer (POA)  Significant Hospital Events:  1/19>> Admit to [facility] for abdominal pain-pneumoperitoneum on imaging studies- 1/20 exploratory laparotomy negative/ extubated  1/21 iHD, low dose phenyl needed, weaned off after iHD stopped, started on stress dose steroids - got 1 dose and was stopped, resumed home midodrine 1/22>> transferred to [facility] service. 1/26>> plans to discharge back to SNF-however upon further evaluation-needed inpatient hydrotherapy for worsening sacral wound. 1/27>> general surgery consulted for sacral wound-continue hydrotherapy for few more days. 1/31 Back to the OR for Pneumoperitoneum ( Exp. Lap Left colectomy, end colectomy), to ICU for shock post op, remains on vent  Consults:  [facility] Nephrology   Procedures:  1/10 right subclavian DL TDC >>  1/20 ex lap >> neg 1/31 ex lap >> end colostomy/ left colectomy   ETT 1/20; 1/31 >> Left radial aline 1/31 >>  Significant Diagnostic Tests:  1/20 CT abdomen and pelvis: There is a focal area of wall thickening with adjacent perforation of the distal descending colon at the junction of the sigmoid colon, likely the transition point. 2. There is moderately dilated sigmoid colon and rectum which is stool-filled and suggestion of possible pneumatosis. 3. Free air seen under the right hemidiaphragm and within the porta hepatis. 4. Small amount of air seen within the inferior right liver lobe which is concerning for portal venous gas given the patient's findings 5. Bilateral sacral decubitus ulceration with phlegmon and subcutaneous emphysema extending to the inferior coccyx.  1/31 KUB >> Massive pneumoperitoneum again noted. Note that this typically more than  would be expected given laparotomy on 12/08/2020 and an ongoing air leak secondary to visceral perforation should be considered.   Repeat CT imaging with oral contrast may be helpful to identify the source of leak.  Suspected long segment colitis involving the distal descending and sigmoid colon.  2/1 KUB>> stable massive pneumoperitoneum; gastric tube in stomach   Micro Data:  1/20 influenza PCR negative 1/20 COVID PCR positive 1/21 MRSA PCR >> positive  1/31 BCx2 >>  Antimicrobials:  Zosyn 1/19 > 1/24; 1/31 >>  Interim History / Subjective:   Tolerated PS yesterday. No events overnight. Weaned off levophed. Became hypothermic this afternoon.   Objective   Blood pressure (!) 162/87, pulse 80, temperature (!) 90.32 F (32.4 C), temperature source Esophageal, resp. rate 20, height 6\' 2"  (1.88 m), weight 70 kg, SpO2 100 %.    Vent Mode: PRVC FiO2 (%):  [30 %] 30 % Set Rate:  [20 bmp] 20 bmp Vt Set:  [570 mL] 570 mL PEEP:  [5 cmH20] 5 cmH20 Plateau Pressure:  [14 cmH20] 14 cmH20   Intake/Output Summary (Last 24 hours) at 01/03/2021 1321 Last data filed at 01/03/2021 1300 Gross per 24 hour  Intake 1694.59 ml  Output 572 ml  Net 1122.59 ml   Filed Weights   01/02/21 0700 01/02/21 0956 01/03/21 0314  Weight: 68.3 kg 68.8 kg 70 kg   Physical Exam: General: Chronically ill-appearing, cachectic, thin malnourished, no acute distress HENT: [place], AT, ETT in place Eyes: EOMI, no scleral icterus Respiratory: Clear to auscultation bilaterally.  No crackles, wheezing or rales Cardiovascular: RRR, -M/R/G, no JVD GI: BS+, open abdomen, left lower quadrant ostomy, JP drain in place with bloody drainage Extremities: +3+ pitting edema in upper and lower extremities,-tenderness, left distal digit dry ischemia Neuro: Awake and alert, follows commands Skin: Sacral wound not visualized   Resolved Hospital Problem list   Large bowel obstruction status post exploratory laparotomy Acute metabolic acidosis COVID 19 infection- off isolation   Assessment & Plan:  Pneumoperitoneum s/p ex-lap resulting in left colectomy with  end colostomy  Ischemic bowel - s/p end colostomy and left colectomy 1/31, evidence of ischemic left colon - Management per [facility] - will remain NPO- TPN per pharmacy started 2/1  - follow surgical pathology 1/31 in process - BID dressing changes - Continue NG tube LWIS  Septic shock, hx orthostatic: multifactorial- poor nutrition, chronic hypotension, AI, sedation, rule out intra-abdominal infection given microperforation/ pneumoperitoneum, sacral ulcer - off levophed - continue stress dose steroids-> solucortef while NPO - trend CBC - follow blood cultures, ngtd - Zosyn per pharmacy for 5 days( start date 1/31) - nutrition- TPN as below   Acute hypoxic respiratory insufficiency post-operatively, previously due to volume overload from missing HD session Open abdomen - Full vent support. Hold extubation until abdomen closed - CXR / ABG prn - VAP/ PPI  - daily SBT/ WUA - PAD protocol -> fentanyl gtt for pain, minimize as able  - volume removal per CRRT  ESRD on TTS (new to HD since 10/2020) - CRRT per nephrology - trend renal indices  - continue foley for now, if ongoing low UOP, consider d/c foley > bladder scan, however UOP trend 1L-> 25 ml  Diabetes type 2: - SSI sensitive   PAF w/RVR - remains in NSR - PO amiodarone held - hold betablocker (while on pressors) - no systemic AC given prior GIB  Sacral wound- un-stageable, present on admission - per WOC/ surgery recommendations, hydrotherapy as tolerated  -  maximize nutrition   Anemia- multifactorial, of chronic illness, ABLA - transfused 1 unit 1/29 and 1/30 secondary to [facility]. Transfused 1 U 2/3 - remains on PPI BID - trend CBC - transfuse for Hgb < 7  Thrombocytopenia - suspect sepsis -  Platelets trend stable, continue to monitor/ trend  -  Transfuse for goal plt >10k  Chronic debility/ deconditioning -  mostly bed bound now, PT/ OT  Protein calorie malnutrition>> severe - TPN per pharmacy   Best practice  (evaluated daily)  Diet: NPO, TPN Pain/Anxiety/Delirium protocol (if indicated): minimize- fentanyl gtt VAP protocol (if indicated): yes DVT prophylaxis: SCDs only  GI prophylaxis: PPI  Glucose control: SSI sensitive Mobility: PT/OT Disposition: ICU  Goals of Care:   Code Status: Full code Poor prognosis, Goals of care discussion should be ongoing with family.  Plan for multidisciplinary meeting on 2/4 with family, surgery and nephrology.  Labs   CBC: Recent Labs  Lab 12/24/2020 0325 12/11/2020 1335 12/02/2020 1724 12/22/2020 1751 01/01/21 0254 01/01/21 1730 01/02/21 0305 01/03/21 0439  WBC 9.2  --  8.4  --  10.3  --  10.9* 11.6*  NEUTROABS 8.1*  --  7.7  --  9.5*  --  9.9* 10.6*  HGB 9.4*   < > 8.5* 8.8* 7.8*  --  7.7* 6.3*  HCT 28.9*   < > 25.9* 26.0* 23.6*  --  24.0* 19.1*  MCV 93.2  --  92.5  --  92.2  --  94.9 95.5  PLT 65*  --  55*  51*  --  44* 50* 49* 36*   < > = values in this interval not displayed.    Basic Metabolic Panel: Recent Labs  Lab 12/29/20 0446 12/30/20 0327 12/29/2020 0325 12/28/2020 1335 12/18/2020 1724 12/08/2020 1751 01/01/21 0254 01/02/21 0305 01/03/21 0301  NA 132* 134* 134*   < > 132* 132* 133* 131* 132*  K 4.4 4.3 4.3   < > 4.2 4.2 4.7 5.2* 4.0  CL 100 101 100  --  100  --  100 98 99  CO2 19* 19* 16*  --  16*  --  14* 14* 17*  GLUCOSE 115* 122* 122*  --  167*  --  131* 216* 237*  BUN 55* 66* 79*  --  85*  --  88* 104* 81*  CREATININE 5.05* 5.42* 6.18*  --  6.14*  --  6.06* 6.45* 4.92*  CALCIUM 7.5* 7.6* 7.6*  --  7.5*  --  7.3* 7.5* 7.1*  MG  --  2.1 2.0  --   --   --  1.9 1.9 1.8  PHOS 4.9*  --   --   --   --   --  6.1* 7.2* 5.2*   < > = values in this interval not displayed.   GFR: Estimated Creatinine Clearance: 13.8 mL/min (A) (by C-G formula based on SCr of 4.92 mg/dL (H)). Recent Labs  Lab 12/29/20 0446 12/29/20 0728 12/15/2020 1724 01/01/21 0254 01/02/21 0305 01/03/21 0439  WBC 10.1   < > 8.4 10.3 10.9* 11.6*  LATICACIDVEN 2.9*   --  1.5  --   --   --    < > = values in this interval not displayed.    Liver Function Tests: Recent Labs  Lab 12/30/20 0327 12/22/2020 0325 01/01/21 0254 01/02/21 0305 01/03/21 0301  AST 239* 200* 103* 58* 33  ALT 175* 161* 108* 86* 61*  ALKPHOS 167* 167* 107 120 99  BILITOT 1.3* 0.8  1.3* 1.2 0.9  PROT 4.6* 4.7* 4.5* 4.6* 4.3*  ALBUMIN 1.7* 1.6* 1.7* 1.7* 1.6*   No results for input(s): LIPASE, AMYLASE in the last 168 hours. No results for input(s): AMMONIA in the last 168 hours.  ABG    Component Value Date/Time   PHART 7.420 12/10/2020 1751   PCO2ART 28.5 (L) 12/09/2020 1751   PO2ART 476 (H) 12/29/2020 1751   HCO3 18.8 (L) 12/10/2020 1751   TCO2 20 (L) 12/07/2020 1751   ACIDBASEDEF 5.0 (H) 12/05/2020 1751   O2SAT 100.0 12/03/2020 1751     Coagulation Profile: Recent Labs  Lab 12/28/2020 1724 01/01/21 0254 01/01/21 1730 01/02/21 0305 01/03/21 0301  INR 1.2 1.2 1.1 1.1 1.2    Cardiac Enzymes: No results for input(s): CKTOTAL, CKMB, CKMBINDEX, TROPONINI in the last 168 hours.  HbA1C: Hgb A1c MFr Bld  Date/Time Value Ref Range Status  12/21/2020 12:08 PM 4.1 (L) 4.8 - 5.6 % Final    Comment:    (NOTE) Pre diabetes:          5.7%-6.4%  Diabetes:              >6.4%  Glycemic control for   <7.0% adults with diabetes   06/13/2018 03:16 AM 6.6 (H) 4.8 - 5.6 % Final    Comment:    (NOTE) Pre diabetes:          5.7%-6.4% Diabetes:              >6.4% Glycemic control for   <7.0% adults with diabetes     CBG: Recent Labs  Lab 01/02/21 2024 01/02/21 2345 01/03/21 0347 01/03/21 0817 01/03/21 1300  GLUCAP 188* 220* 181* 223* 248*    The patient is critically ill with multiple organ systems failure and requires high complexity decision making for assessment and support, frequent evaluation and titration of therapies, application of advanced monitoring technologies and extensive interpretation of multiple databases.  Independent Critical Care Time: 38  Minutes.   Rodman Pickle, M.D. [facility] Pulmonary/Critical Care Medicine 01/03/2021 1:21 PM   Please see Amion for pager number to reach on-call Pulmonary and Critical Care Team.

## 2021-01-03 NOTE — Progress Notes (Signed)
PMT consult received and chart reviewed. Discussed with Dr. Loanne Drilling and RN. Patient remains intubated but is awake and will nod head yes/no to simple questions. Daughter is planning to visit tomorrow 2/4 for goals of care discussion with her father and multidisciplinary team. PMT provider will call daughter early AM to determine approximately what time she is available to meet with care team.   [place], [place], [place] Palliative Medicine Team  Phone: [phone] Fax: [phone]

## 2021-01-03 NOTE — Progress Notes (Signed)
Physical Therapy Treatment Patient Details Name: [name] MRN: [id number] DOB: [birth year] Today's Date: 01/03/2021    History of Present Illness 70 y.o. male with PMHx of ESRD, TThS chronic diarrhea (with extensive work-up in the past), orthostatic hypotension, PAF-anticoagulation on hold due to history of GI bleeding, -presented to the hospital from SNF [facility]) 12/18/20 due to PTAR not picking up for HD, as well as abdominal pain, nausea and vomiting-found to have pneumoperitoneum on imaging studies-underwent exploratory laparotomy 12/30/2020 without any findings of bowel perforation.  Also found to be COVID-positive. Currently being treated with hydrotherapy for sacral wound. 12/30/2020 underwent colectomy with colostomy.    PT Comments    Patient seen while intubated and tolerated bed-level session well. Patient has had wide swings in his BP and was stable via a-line throughout session. Patient consistently following 1 step commands x 4 extremities. Demonstrated strength grossly 3+/5 throughout. Depending on ability to wean from ventilator, may need to consider LTACH.    Follow Up Recommendations  SNF     Equipment Recommendations  Wheelchair (measurements PT);Wheelchair cushion (measurements PT)    Recommendations for Other Services       Precautions / Restrictions Precautions Precautions: Fall;Other (comment) Precaution Comments: vent    Mobility  Bed Mobility Overal bed mobility: Needs Assistance Bed Mobility: Rolling Rolling: Max assist         General bed mobility comments: +rails, cues for sequencing, assist with all aspects of mobility  Transfers                 General transfer comment: unable due to RN in to initiate CRRT  Ambulation/Gait                 Stairs             Wheelchair Mobility    Modified Rankin (Stroke Patients Only)       Balance                                            Cognition  Arousal/Alertness: Awake/alert Behavior During Therapy: Flat affect Overall Cognitive Status: Difficult to assess                                 General Comments: following 1 step commands with some delay      Exercises General Exercises - Lower Extremity Ankle Circles/Pumps: AAROM;Both;PROM;5 reps (prolonged stretch into DF) Heel Slides: AAROM;Both;5 reps;Supine    General Comments General comments (skin integrity, edema, etc.): Intubated on PRVC with RR 20, PEEP 5, FiO2 30% with VSS throughout      Pertinent Vitals/Pain Pain Assessment: Faces Faces Pain Scale: Hurts a little bit Pain Location: generalized with rolling Pain Descriptors / Indicators: Guarding;Discomfort    Home Living                      Prior Function            PT Goals (current goals can now be found in the care plan section) Acute Rehab PT Goals Patient Stated Goal: unable to state (intubated) PT Goal Formulation: Patient unable to participate in goal setting Time For Goal Achievement: 01/17/21 Potential to Achieve Goals: Poor Progress towards PT goals: Not progressing toward goals - comment (setback due to abd surgery; goals  remain appropriate; timeframe updated)    Frequency    Min 2X/week      PT Plan Current plan remains appropriate (may need to consider LTACH if not weaning)    Co-evaluation              AM-PAC PT "6 Clicks" Mobility   Outcome Measure  Help needed turning from your back to your side while in a flat bed without using bedrails?: Total Help needed moving from lying on your back to sitting on the side of a flat bed without using bedrails?: Total Help needed moving to and from a bed to a chair (including a wheelchair)?: Total Help needed standing up from a chair using your arms (e.g., wheelchair or bedside chair)?: Total Help needed to walk in hospital room?: Total Help needed climbing 3-5 steps with a railing? : Total 6 Click Score: 6     End of Session   Activity Tolerance: Patient limited by fatigue Patient left: in bed;with call bell/phone within reach;with bed alarm set Nurse Communication: Mobility status PT Visit Diagnosis: Muscle weakness (generalized) (M62.81);Pain;Difficulty in walking, not elsewhere classified (R26.2);Other abnormalities of gait and mobility (R26.89)     Time: 1660-6301 PT Time Calculation (min) (ACUTE ONLY): 28 min  Charges:  $Therapeutic Activity: 8-22 mins                      [name], PT Pager [phone]    [name] 01/03/2021, 11:45 AM

## 2021-01-03 NOTE — Progress Notes (Addendum)
3 Days Post-Op    CC: Pneumoperitoneum/acute hypoxic respiratory failures s/p laparotomy  Subjective: Sedated, but alert on the vent.  Midline incision looks okay.  Serosanguineous drainage from the [place].  The ostomy bag has some sweating at but nothing else.  The ostomy itself does not look is pink today looks more yellowish in color this AM.  Were going to look at his decubitus later together with hydrotherapy.  Objective: Vital signs in last 24 hours: Temp:  [97.6 F (36.4 C)-98 F (36.7 C)] 97.6 F (36.4 C) (02/03 0400) Pulse Rate:  [50-131] 51 (02/03 0715) Resp:  [8-26] 20 (02/03 0715) BP: (109-156)/(64-79) 132/64 (02/03 0400) SpO2:  [50 %-100 %] 97 % (02/03 0715) Arterial Line BP: (75-168)/(41-74) 136/50 (02/03 0715) FiO2 (%):  [30 %] 30 % (02/03 0400) Weight:  [68.8 kg-70 kg] 70 kg (02/03 0314) Last BM Date: 01/03/21 1569 IV 40 NG Drain 225 Dialysis -463 Stool x1 Afebrile blood pressure stable; weaning norepinephrine Heart rates in the 50s Ongoing ventilator support FiO2 30% NA 132, glucose 237,  WBC 11.6 H/H 6.3/19.1 Platelets 36,000  Intake/Output from previous day: 02/02 0701 - 02/03 0700 In: 1569.3 [I.V.:1419.3; IV Piggyback:150] Out: -198 [Emesis/NG output:40; Drains:225] Intake/Output this shift: No intake/output data recorded.  General appearance: alert, no distress and Resting comfortably on the ventilator. Resp: Still on full vent support. GI: Soft, midline incision looks fine.  There is some sweat in the ostomy bag but nothing else.  Ostomy itself looks more yellow today been pink.    01/03/21  More purulent drainage and increased ischemia on left.  Still 4 cm of undermining with more clearly purulent fluid.  12/09/2020  Lab Results:  Recent Labs    01/02/21 0305 01/03/21 0439  WBC 10.9* 11.6*  HGB 7.7* 6.3*  HCT 24.0* 19.1*  PLT 49* 36*    BMET Recent Labs    01/02/21 0305 01/03/21 0301  NA 131* 132*  K 5.2* 4.0  CL 98 99  CO2 14* 17*   GLUCOSE 216* 237*  BUN 104* 81*  CREATININE 6.45* 4.92*  CALCIUM 7.5* 7.1*   PT/INR Recent Labs    01/02/21 0305 01/03/21 0301  LABPROT 14.2 15.0  INR 1.1 1.2    Recent Labs  Lab 12/30/20 0327 12/07/2020 0325 01/01/21 0254 01/02/21 0305 01/03/21 0301  AST 239* 200* 103* 58* 33  ALT 175* 161* 108* 86* 61*  ALKPHOS 167* 167* 107 120 99  BILITOT 1.3* 0.8 1.3* 1.2 0.9  PROT 4.6* 4.7* 4.5* 4.6* 4.3*  ALBUMIN 1.7* 1.6* 1.7* 1.7* 1.6*     Lipase     Component Value Date/Time   LIPASE 14 12/23/2020 0040     Medications: . sodium chloride   Intravenous Once  . chlorhexidine gluconate (MEDLINE KIT)  15 mL Mouth Rinse BID  . Chlorhexidine Gluconate Cloth  6 each Topical Q0600  . collagenase   Topical Daily  . hydrocortisone sod succinate (SOLU-CORTEF) inj  50 mg Intravenous Q6H  . insulin aspart  0-9 Units Subcutaneous Q4H  . mouth rinse  15 mL Mouth Rinse 10 times per day  . mupirocin ointment   Nasal BID  . pantoprazole (PROTONIX) IV  40 mg Intravenous Q24H  . sodium chloride flush  10-40 mL Intracatheter Q12H    Assessment/Plan End-stage renal disease Chronic diarrhea/colitis since 10/2020 Hx orthostatic hypotension Hx PAF-on anticoagulation Covid positive Bedbound Anemia/thrombocytopenia  - H/H 9.4/28.9>>7.8/23.6>>6.3/19.1  - platelets 65K>>44K>>36K Protein calorie malnutrition -severe(prealbumin<5)   Pneumoperitoneum, pneumatosis, portal venous  gas s/p negativeexploratory laparotomy1/20 Dr. Bobbye Morton; POD #14 - intraop: no acute findings, no contamination, excellent blood flow to intestine,congested rectosigmoid colon  -Worsening pneumoperitoneum Exploratory laparotomy, left colectomy, end colostomy, 12/30/2020 Dr. Rolm Bookbinder POD #3   Chronic stage IV sacral decubitus pressure ulcer (12/10/20, first picture [facility]) - wound evaluated today  - will review with DR. Donne Hazel  - he also has some blood clots at his rectum  ID-zosyn 1/20-1/25,  restart 1/31>> day 4 (5 days post op abx) FEN- npo/TPN VTE-SCD's (platelets 49K>>36K)  Plan: Continue acute care per CCM.  The decubitus looks worse with frankly purulent malodorous drainage noted on exam today.  Pictures above.  LOS: 14 days    [name] 01/03/2021 Please see Amion

## 2021-01-03 NOTE — Progress Notes (Signed)
Patient ID: [name], male   DOB: 09-22-1951, 70 y.o.   MRN: [id number] S: hgb dropped to 6.3 and transfused overnight. O:BP 132/64 (BP Location: Right Leg)   Pulse (!) 51   Temp 97.6 F (36.4 C) (Oral)   Resp 20   Ht 6' 2"  (1.88 m)   Wt 70 kg   SpO2 97%   BMI 19.81 kg/m   Intake/Output Summary (Last 24 hours) at 01/03/2021 0756 Last data filed at 01/03/2021 0700 Gross per 24 hour  Intake 1569.25 ml  Output -198 ml  Net 1767.25 ml   Intake/Output: I/O last 3 completed shifts: In: 2259.4 [P.O.:40; I.V.:1969.5; IV Piggyback:249.9] Out: 2 [Emesis/NG output:150; Drains:315]  Intake/Output this shift:  No intake/output data recorded. Weight change: -3 kg Gen: on vent but awake and interactive CVS: bradycardic Resp: ventilated BS bilatarally Abd: +BS, dressing in place, colostomy in LLQ Ext: 1+ edema  Recent Labs  Lab 12/29/20 0446 12/30/20 0327 12/23/2020 0325 12/29/2020 1335 12/21/2020 1418 12/28/2020 1724 12/30/2020 1751 01/01/21 0254 01/02/21 0305 01/03/21 0301  NA 132* 134* 134* 134* 135 132* 132* 133* 131* 132*  K 4.4 4.3 4.3 4.2 4.1 4.2 4.2 4.7 5.2* 4.0  CL 100 101 100  --   --  100  --  100 98 99  CO2 19* 19* 16*  --   --  16*  --  14* 14* 17*  GLUCOSE 115* 122* 122*  --   --  167*  --  131* 216* 237*  BUN 55* 66* 79*  --   --  85*  --  88* 104* 81*  CREATININE 5.05* 5.42* 6.18*  --   --  6.14*  --  6.06* 6.45* 4.92*  ALBUMIN 1.6* 1.7* 1.6*  --   --   --   --  1.7* 1.7* 1.6*  CALCIUM 7.5* 7.6* 7.6*  --   --  7.5*  --  7.3* 7.5* 7.1*  PHOS 4.9*  --   --   --   --   --   --  6.1* 7.2* 5.2*  AST  --  239* 200*  --   --   --   --  103* 58* 33  ALT  --  175* 161*  --   --   --   --  108* 86* 61*   Liver Function Tests: Recent Labs  Lab 01/01/21 0254 01/02/21 0305 01/03/21 0301  AST 103* 58* 33  ALT 108* 86* 61*  ALKPHOS 107 120 99  BILITOT 1.3* 1.2 0.9  PROT 4.5* 4.6* 4.3*  ALBUMIN 1.7* 1.7* 1.6*   No results for input(s): LIPASE, AMYLASE in the last 168  hours. No results for input(s): AMMONIA in the last 168 hours. CBC: Recent Labs  Lab 12/06/2020 0325 12/14/2020 1335 12/15/2020 1724 12/01/2020 1751 01/01/21 0254 01/01/21 1730 01/02/21 0305 01/03/21 0439  WBC 9.2  --  8.4  --  10.3  --  10.9* 11.6*  NEUTROABS 8.1*  --  7.7  --  9.5*  --  9.9* 10.6*  HGB 9.4*   < > 8.5*   < > 7.8*  --  7.7* 6.3*  HCT 28.9*   < > 25.9*   < > 23.6*  --  24.0* 19.1*  MCV 93.2  --  92.5  --  92.2  --  94.9 95.5  PLT 65*  --  55*  51*  --  44* 50* 49* 36*   < > = values in this interval  not displayed.   Cardiac Enzymes: No results for input(s): CKTOTAL, CKMB, CKMBINDEX, TROPONINI in the last 168 hours. CBG: Recent Labs  Lab 01/02/21 1144 01/02/21 1539 01/02/21 2024 01/02/21 2345 01/03/21 0347  GLUCAP 166* 185* 188* 220* 181*    Iron Studies: No results for input(s): IRON, TIBC, TRANSFERRIN, FERRITIN in the last 72 hours. Studies/Results: DG Abd 1 View  Result Date: 01/01/2021 CLINICAL DATA:  Check gastric catheter placement EXAM: ABDOMEN - 1 VIEW COMPARISON:  12/26/2020 FINDINGS: Nasogastric catheter is again noted within the stomach. Considerable pneumoperitoneum is again seen. The overall appearance is stable from the previous day. IMPRESSION: Gastric catheter within the stomach. Stable massive pneumoperitoneum. Electronically Signed   By: Inez Catalina M.D.   On: 01/01/2021 19:28   DG CHEST PORT 1 VIEW  Result Date: 01/02/2021 CLINICAL DATA:  Pneumoperitoneum. EXAM: PORTABLE CHEST 1 VIEW COMPARISON:  Abdomen 01/01/2021.  Chest x-ray 01/01/2021. FINDINGS: Endotracheal tube, NG tube, right dual-lumen central catheter, left IJ line in stable position. Heart size stable. Persistent bibasilar infiltrates without interim change. No pleural effusion or pneumothorax. Prominent pneumonectomy again noted. IMPRESSION: 1. Lines and tubes in stable position. 2. Persistent bibasilar infiltrates without interim change. 3. Prominent pneumoperitoneum again noted.  Electronically Signed   By: Marcello Moores  Register   On: 01/02/2021 05:15   DG CHEST PORT 1 VIEW  Result Date: 01/01/2021 CLINICAL DATA:  Central line placement EXAM: PORTABLE CHEST 1 VIEW COMPARISON:  01/01/2021 FINDINGS: Endotracheal tube in good position. Right jugular central venous catheter tip at the cavoatrial junction unchanged. NG side hole in the distal esophagus with tip in the stomach Interval placement of left jugular central venous catheter with the tip in the mid SVC. No pneumothorax. Bibasilar airspace disease right greater than left unchanged. No edema. Pneumoperitoneum unchanged. IMPRESSION: Left jugular central venous catheter tip mid SVC.  No pneumothorax Bibasilar airspace disease unchanged NG side hole distal esophagus Pneumoperitoneum unchanged. Electronically Signed   By: Franchot Gallo M.D.   On: 01/01/2021 16:28   . sodium chloride   Intravenous Once  . chlorhexidine gluconate (MEDLINE KIT)  15 mL Mouth Rinse BID  . Chlorhexidine Gluconate Cloth  6 each Topical Q0600  . collagenase   Topical Daily  . hydrocortisone sod succinate (SOLU-CORTEF) inj  50 mg Intravenous Q6H  . insulin aspart  0-9 Units Subcutaneous Q4H  . mouth rinse  15 mL Mouth Rinse 10 times per day  . mupirocin ointment   Nasal BID  . pantoprazole (PROTONIX) IV  40 mg Intravenous Q24H  . sodium chloride flush  10-40 mL Intracatheter Q12H    BMET    Component Value Date/Time   NA 132 (L) 01/03/2021 0301   K 4.0 01/03/2021 0301   CL 99 01/03/2021 0301   CO2 17 (L) 01/03/2021 0301   GLUCOSE 237 (H) 01/03/2021 0301   BUN 81 (H) 01/03/2021 0301   CREATININE 4.92 (H) 01/03/2021 0301   CALCIUM 7.1 (L) 01/03/2021 0301   GFRNONAA 12 (L) 01/03/2021 0301   GFRAA 19 (L) 06/15/2018 0407   CBC    Component Value Date/Time   WBC 11.6 (H) 01/03/2021 0439   RBC 2.00 (L) 01/03/2021 0439   HGB 6.3 (LL) 01/03/2021 0439   HCT 19.1 (L) 01/03/2021 0439   PLT 36 (L) 01/03/2021 0439   MCV 95.5 01/03/2021 0439   MCH  31.5 01/03/2021 0439   MCHC 33.0 01/03/2021 0439   RDW 18.7 (H) 01/03/2021 0439   LYMPHSABS 0.4 (L) 01/03/2021 9381  MONOABS 0.5 01/03/2021 0439   EOSABS 0.0 01/03/2021 0439   BASOSABS 0.0 01/03/2021 0439     OP HD: TTS [facility] 66.5kg dry wt per OP HD RN/ R IJ TDC   Assessment/Plan:  1. Pneumoperitoneum/ shock -sp exlap, no perforated viscous found.Abd films today with worsening pneumoperitoneum ands/p left colectomy/colostomy 12/07/2020. 2. VDRF- currently on vent post op per PCCM 3. ESRD - on HD TTS. Not able to UF with IHD and will start CRRT today to help with UF and wound healing. 4. Covid positive - therapies per primary team 5. Orthostatic hypotension - chronic issue, pt is bed-bound it appears and does stretcher dialysis w/ the group in [facility]. On midodrine and fluorinef outpatient per charting.  6. BP /volume - has sig LE/ UE edema, up 1-2kg by wt's, no resp issues. Unable to UF large amts due to low alb/ 3rd spacing of fluid.  7. DM2 - per primary team  8. Anemia ckd - no emergent need for PRBC's. need records re: ESA dosing.  9. Hypocalcemia - repleted; added ca bath 10. Prognosis - very poorand has been seen by Palliative care in the past and no evidence of improvement.  He has severe autonomic dysfunction and is predominantly bed bound and can only get dialysis on a stretcher.  Now with perforated colon s/p colectomy/colostomy, ongoing hypotension and hypoalbuminemia with poor chance of meaningful recovery or healing.  Will reconsult for possible transition to comfort care.   Donetta Potts, MD [name] [phone]

## 2021-01-03 NOTE — Progress Notes (Signed)
Occupational Therapy Treatment Note Pt intubated since surgery 1/31. Vent setting: PRVC; peep 5. spO2 98l; HR 77; BP 120/51 (A-line)the patient alert and following 1 step commands. Pt presents with generalized weakness but attempting to tasks. Session limited due to fatigue and nsg needing to start CRRT.  Pt from SNF however was apparently independent and walking prior to his recent illness. Pt will benefit from rehab at SNF. Will continue to follow acutely.    01/03/21 1300  OT Visit Information  Last OT Received On 01/03/21  Assistance Needed +2  History of Present Illness 70 y.o. male with PMHx of ESRD, TThS chronic diarrhea (with extensive work-up in the past), orthostatic hypotension, PAF-anticoagulation on hold due to history of GI bleeding, -presented to the hospital from SNF [facility]) 12/18/20 due to PTAR not picking up for HD, as well as abdominal pain, nausea and vomiting-found to have pneumoperitoneum on imaging studies-underwent exploratory laparotomy 12/18/2020 without any findings of bowel perforation.  Also found to be COVID-positive. Currently being treated with hydrotherapy for sacral wound. 12/04/2020 underwent colectomy with colostomy. Pt has remained intubated since his surgery on 1/31.  Precautions  Precautions Fall;Other (comment)  Precaution Comments vent  Pain Assessment  Faces Pain Scale 2  Pain Location generalized with rolling  Pain Descriptors / Indicators Guarding;Discomfort  Pain Intervention(s) Monitored during session  Cognition  Arousal/Alertness Awake/alert  Behavior During Therapy Flat affect  Overall Cognitive Status Difficult to assess  General Comments following 1 step commands with some delay  Difficult to assess due to Intubated  Upper Extremity Assessment  Upper Extremity Assessment Generalized weakness;RUE deficits/detail;LUE deficits/detail  RUE Deficits / Details Limited to 90 flex due to lines; generalzied weakness. shoulder 3/5; elbow 3/5;  wrist/hand 3+/5. able to touch top of head with min A  RUE Coordination decreased fine motor;decreased gross motor  LUE Deficits / Details more edematous than R; shoulder 2+/5, elbow/wrist/hand 3+/5  ADL  Overall ADL's  Needs assistance/impaired  General ADL Comments overall total A with ADL tasks  Bed Mobility  Overal bed mobility Needs Assistance  Bed Mobility Rolling  Rolling Max assist  General bed mobility comments +rails, cues for sequencing, assist with all aspects of mobility  Transfers  General transfer comment unable due to RN in to initiate CRRT  General Comments  General comments (skin integrity, edema, etc.) Intubated on PRVC with RR 20, PEEP 5, FiO2 30% with VSS throughout  Exercises  Exercises General Upper Extremity  General Exercises - Upper Extremity  Shoulder Flexion Strengthening;AAROM;Both;10 reps;Supine  Elbow Flexion AROM;Strengthening;Left;10 reps;Supine  Elbow Extension AROM;Strengthening;Both;10 reps;Supine  Shoulder ADduction AROM;Strengthening;AAROM;Both;10 reps;Supine  Digit Composite Flexion AROM;Strengthening;Both;10 reps;Supine  Composite Extension AROM;Strengthening;Both;10 reps  General Exercises - Lower Extremity  Ankle Circles/Pumps  (prolonged stretch into DF)  OT - End of Session  Equipment Utilized During Treatment  (vent)  Activity Tolerance Patient limited by fatigue;Other (comment) (Nsg initiating CRRT)  Patient left in bed;with call bell/phone within reach;with SCD's reapplied  Nurse Communication Mobility status  OT Assessment/Plan  OT Plan Discharge plan remains appropriate  OT Visit Diagnosis Other abnormalities of gait and mobility (R26.89);Pain;Muscle weakness (generalized) (M62.81)  Pain - part of body  (generalized)  OT Frequency (ACUTE ONLY) Min 2X/week  Follow Up Recommendations SNF  OT Equipment Other (comment)  AM-PAC OT "6 Clicks" Daily Activity Outcome Measure (Version 2)  Help from another person eating meals? 1   Help from another person taking care of personal grooming? 1  Help from another person toileting, which  includes using toliet, bedpan, or urinal? 1  Help from another person bathing (including washing, rinsing, drying)? 1  Help from another person to put on and taking off regular upper body clothing? 1  Help from another person to put on and taking off regular lower body clothing? 1  6 Click Score 6  OT Goal Progression  Progress towards OT goals Not progressing toward goals - comment (intubated since surgery 1/31)  Acute Rehab OT Goals  Patient Stated Goal unable to state (intubated)  OT Goal Formulation Patient unable to participate in goal setting  Time For Goal Achievement 01/09/21  Potential to Achieve Goals Fair  ADL Goals  Pt Will Perform Grooming with supervision;sitting;bed level  Pt Will Perform Upper Body Bathing with supervision;sitting;bed level  Pt Will Perform Upper Body Dressing with min guard assist;sitting;bed level  Pt/caregiver will Perform Home Exercise Program Increased strength;Both right and left upper extremity;With written HEP provided;With Supervision  Additional ADL Goal #1 Pt will perform bed mobility with modA as precursor to EOB/OOB ADL.  Additional ADL Goal #2 Pt will tolerate sitting EOB >5 min as precursor to ADL.  OT Time Calculation  OT Start Time (ACUTE ONLY) 0901  OT Stop Time (ACUTE ONLY) 8377  OT Time Calculation (min) 27 min  OT General Charges  $OT Visit 1 Visit  OT Treatments  $Self Care/Home Management  8-22 mins  Maurie Boettcher, OT/L   Acute OT Clinical Specialist [place] Pager [phone] Office [phone]

## 2021-01-04 DIAGNOSIS — J9600 Acute respiratory failure, unspecified whether with hypoxia or hypercapnia: Secondary | ICD-10-CM | POA: Diagnosis not present

## 2021-01-04 DIAGNOSIS — L89159 Pressure ulcer of sacral region, unspecified stage: Secondary | ICD-10-CM

## 2021-01-04 DIAGNOSIS — Z515 Encounter for palliative care: Secondary | ICD-10-CM

## 2021-01-04 DIAGNOSIS — Z7189 Other specified counseling: Secondary | ICD-10-CM

## 2021-01-04 DIAGNOSIS — K631 Perforation of intestine (nontraumatic): Secondary | ICD-10-CM | POA: Diagnosis not present

## 2021-01-04 DIAGNOSIS — E43 Unspecified severe protein-calorie malnutrition: Secondary | ICD-10-CM | POA: Diagnosis not present

## 2021-01-04 DIAGNOSIS — K668 Other specified disorders of peritoneum: Secondary | ICD-10-CM | POA: Diagnosis not present

## 2021-01-04 LAB — CBC WITH DIFFERENTIAL/PLATELET
Abs Immature Granulocytes: 0.1 10*3/uL — ABNORMAL HIGH (ref 0.00–0.07)
Basophils Absolute: 0 10*3/uL (ref 0.0–0.1)
Basophils Relative: 0 %
Eosinophils Absolute: 0 10*3/uL (ref 0.0–0.5)
Eosinophils Relative: 0 %
HCT: 21.3 % — ABNORMAL LOW (ref 39.0–52.0)
Hemoglobin: 6.8 g/dL — CL (ref 13.0–17.0)
Immature Granulocytes: 1 %
Lymphocytes Relative: 3 %
Lymphs Abs: 0.4 10*3/uL — ABNORMAL LOW (ref 0.7–4.0)
MCH: 30.5 pg (ref 26.0–34.0)
MCHC: 31.9 g/dL (ref 30.0–36.0)
MCV: 95.5 fL (ref 80.0–100.0)
Monocytes Absolute: 0.7 10*3/uL (ref 0.1–1.0)
Monocytes Relative: 6 %
Neutro Abs: 11 10*3/uL — ABNORMAL HIGH (ref 1.7–7.7)
Neutrophils Relative %: 90 %
Platelets: 32 10*3/uL — ABNORMAL LOW (ref 150–400)
RBC: 2.23 MIL/uL — ABNORMAL LOW (ref 4.22–5.81)
RDW: 19.2 % — ABNORMAL HIGH (ref 11.5–15.5)
WBC: 12.2 10*3/uL — ABNORMAL HIGH (ref 4.0–10.5)
nRBC: 0 % (ref 0.0–0.2)

## 2021-01-04 LAB — RENAL FUNCTION PANEL
Albumin: 1.7 g/dL — ABNORMAL LOW (ref 3.5–5.0)
Anion gap: 16 — ABNORMAL HIGH (ref 5–15)
BUN: 68 mg/dL — ABNORMAL HIGH (ref 8–23)
CO2: 18 mmol/L — ABNORMAL LOW (ref 22–32)
Calcium: 7.3 mg/dL — ABNORMAL LOW (ref 8.9–10.3)
Chloride: 102 mmol/L (ref 98–111)
Creatinine, Ser: 3.38 mg/dL — ABNORMAL HIGH (ref 0.61–1.24)
GFR, Estimated: 19 mL/min — ABNORMAL LOW (ref >60)
Glucose, Bld: 196 mg/dL — ABNORMAL HIGH (ref 70–99)
Phosphorus: 2.2 mg/dL — ABNORMAL LOW (ref 2.5–4.6)
Potassium: 3.7 mmol/L (ref 3.5–5.1)
Sodium: 136 mmol/L (ref 135–145)

## 2021-01-04 LAB — GLUCOSE, CAPILLARY
Glucose-Capillary: 170 mg/dL — ABNORMAL HIGH (ref 70–99)
Glucose-Capillary: 183 mg/dL — ABNORMAL HIGH (ref 70–99)
Glucose-Capillary: 190 mg/dL — ABNORMAL HIGH (ref 70–99)
Glucose-Capillary: 197 mg/dL — ABNORMAL HIGH (ref 70–99)
Glucose-Capillary: 291 mg/dL — ABNORMAL HIGH (ref 70–99)
Glucose-Capillary: 329 mg/dL — ABNORMAL HIGH (ref 70–99)

## 2021-01-04 LAB — COMPREHENSIVE METABOLIC PANEL
ALT: 50 U/L — ABNORMAL HIGH (ref 0–44)
AST: 40 U/L (ref 15–41)
Albumin: 1.5 g/dL — ABNORMAL LOW (ref 3.5–5.0)
Alkaline Phosphatase: 98 U/L (ref 38–126)
Anion gap: 15 (ref 5–15)
BUN: 77 mg/dL — ABNORMAL HIGH (ref 8–23)
CO2: 18 mmol/L — ABNORMAL LOW (ref 22–32)
Calcium: 7.1 mg/dL — ABNORMAL LOW (ref 8.9–10.3)
Chloride: 100 mmol/L (ref 98–111)
Creatinine, Ser: 4.25 mg/dL — ABNORMAL HIGH (ref 0.61–1.24)
GFR, Estimated: 14 mL/min — ABNORMAL LOW (ref >60)
Glucose, Bld: 351 mg/dL — ABNORMAL HIGH (ref 70–99)
Potassium: 3.8 mmol/L (ref 3.5–5.1)
Sodium: 133 mmol/L — ABNORMAL LOW (ref 135–145)
Total Bilirubin: 2.2 mg/dL — ABNORMAL HIGH (ref 0.3–1.2)
Total Protein: 4.2 g/dL — ABNORMAL LOW (ref 6.5–8.1)

## 2021-01-04 LAB — MAGNESIUM: Magnesium: 1.8 mg/dL (ref 1.7–2.4)

## 2021-01-04 LAB — HEMOGLOBIN AND HEMATOCRIT, BLOOD
HCT: 24.4 % — ABNORMAL LOW (ref 39.0–52.0)
Hemoglobin: 7.8 g/dL — ABNORMAL LOW (ref 13.0–17.0)

## 2021-01-04 LAB — PROTIME-INR
INR: 1.3 — ABNORMAL HIGH (ref 0.8–1.2)
Prothrombin Time: 15.9 seconds — ABNORMAL HIGH (ref 11.4–15.2)

## 2021-01-04 LAB — PREPARE RBC (CROSSMATCH)

## 2021-01-04 LAB — PHOSPHORUS: Phosphorus: 3.6 mg/dL (ref 2.5–4.6)

## 2021-01-04 MED ORDER — VITAL HIGH PROTEIN PO LIQD: 1000.0000 mL | ORAL | Status: DC

## 2021-01-04 MED ORDER — CHLORHEXIDINE GLUCONATE 0.12 % MT SOLN
15.0000 mL | Freq: Two times a day (BID) | OROMUCOSAL | Status: DC
  Administered 2021-01-05 – 2021-01-10 (×9): 15 mL via OROMUCOSAL
  Filled 2021-01-04 (×6): qty 15

## 2021-01-04 MED ORDER — CHLORHEXIDINE GLUCONATE 0.12 % MT SOLN
OROMUCOSAL | Status: AC
  Administered 2021-01-04: 15 mL via OROMUCOSAL
  Filled 2021-01-04: qty 15

## 2021-01-04 MED ORDER — TRACE MINERALS CU-MN-SE-ZN 300-55-60-3000 MCG/ML IV SOLN
INTRAVENOUS | Status: AC
  Filled 2021-01-04: qty 960

## 2021-01-04 MED ORDER — MIDODRINE HCL 5 MG PO TABS
5.0000 mg | ORAL_TABLET | Freq: Three times a day (TID) | ORAL | Status: DC
  Administered 2021-01-04: 5 mg via ORAL
  Filled 2021-01-04: qty 1

## 2021-01-04 MED ORDER — HYDROCORTISONE NA SUCCINATE PF 100 MG IJ SOLR
50.0000 mg | Freq: Two times a day (BID) | INTRAMUSCULAR | Status: AC
  Administered 2021-01-04 – 2021-01-06 (×4): 50 mg via INTRAVENOUS
  Filled 2021-01-04 (×4): qty 2

## 2021-01-04 MED ORDER — VITAL 1.5 CAL PO LIQD
1000.0000 mL | ORAL | Status: DC
  Administered 2021-01-04 – 2021-01-07 (×4): 1000 mL
  Filled 2021-01-04: qty 1000

## 2021-01-04 MED ORDER — SODIUM CHLORIDE 0.9% IV SOLUTION: Freq: Once | INTRAVENOUS | Status: AC

## 2021-01-04 MED ORDER — ORAL CARE MOUTH RINSE
15.0000 mL | Freq: Two times a day (BID) | OROMUCOSAL | Status: DC
  Administered 2021-01-05 – 2021-01-10 (×10): 15 mL via OROMUCOSAL

## 2021-01-04 MED ORDER — INSULIN ASPART 100 UNIT/ML ~~LOC~~ SOLN
0.0000 [IU] | SUBCUTANEOUS | Status: DC
  Administered 2021-01-04 (×2): 4 [IU] via SUBCUTANEOUS
  Administered 2021-01-04: 11 [IU] via SUBCUTANEOUS
  Administered 2021-01-04 (×2): 4 [IU] via SUBCUTANEOUS
  Administered 2021-01-05 (×2): 7 [IU] via SUBCUTANEOUS
  Administered 2021-01-05 (×2): 11 [IU] via SUBCUTANEOUS
  Administered 2021-01-05: 15 [IU] via SUBCUTANEOUS
  Administered 2021-01-06: 4 [IU] via SUBCUTANEOUS
  Administered 2021-01-06 (×2): 11 [IU] via SUBCUTANEOUS
  Administered 2021-01-06 (×2): 7 [IU] via SUBCUTANEOUS
  Administered 2021-01-07: 4 [IU] via SUBCUTANEOUS
  Administered 2021-01-07 (×2): 7 [IU] via SUBCUTANEOUS
  Administered 2021-01-07: 11 [IU] via SUBCUTANEOUS
  Administered 2021-01-07: 3 [IU] via SUBCUTANEOUS
  Administered 2021-01-08: 7 [IU] via SUBCUTANEOUS
  Administered 2021-01-08 (×2): 4 [IU] via SUBCUTANEOUS
  Administered 2021-01-08: 7 [IU] via SUBCUTANEOUS
  Administered 2021-01-08: 3 [IU] via SUBCUTANEOUS

## 2021-01-04 NOTE — Consult Note (Signed)
[place] Nurse ostomy follow up Patient receiving care in [facility] 2M12. Stoma type/location: LUQ colostomy Stoma is edematous, pale, sutures intact, no output.  I have ordered 4 inch ostomy pouch, Kellie Simmering [phone], for use if the ostomy continues to enlarge. Val Riles, RN, MSN, CWOCN, CNS-BC, pager [phone]

## 2021-01-04 NOTE — Progress Notes (Signed)
Inpatient Diabetes Program Recommendations  AACE/ADA: New Consensus Statement on Inpatient Glycemic Control (2015)  Target Ranges:  Prepandial:   less than 140 mg/dL      Peak postprandial:   less than 180 mg/dL (1-2 hours)      Critically ill patients:  140 - 180 mg/dL   Lab Results  Component Value Date   GLUCAP 291 (H) 01/04/2021   HGBA1C 4.1 (L) 12/21/2020    Review of Glycemic Control Results for [name], [name] (MRN [id number]) as of 01/04/2021 09:55  Ref. Range 01/03/2021 08:17 01/03/2021 13:00 01/03/2021 16:03 01/03/2021 19:50 01/03/2021 23:41 01/04/2021 03:17 01/04/2021 08:20  Glucose-Capillary Latest Ref Range: 70 - 99 mg/dL 223 (H) 248 (H) 240 (H) 113 (H) 333 (H) 329 (H) 291 (H)   On TPN  Inpatient Diabetes Program Recommendations:    -  Add Levemir 10 units.  Thanks, Tama Headings RN, MSN, BC-ADM Inpatient Diabetes Coordinator Team Pager [phone] (8a-5p)

## 2021-01-04 NOTE — Consult Note (Signed)
Consultation Note Date: 01/04/2021   Patient Name: [name]  DOB: Aug 29, 1951  MRN: [id number]  Age / Sex: 70 y.o., male  PCP: Patient, No Pcp Per Referring Physician: Lanier Clam, MD  Reason for Consultation: Establishing goals of care  HPI/Patient Profile: 70 y.o. male  with past medical history of ESRD on hemodialysis, paroxysmal atrial fibrillation, orthostatic hypotension, GI bleeding, DM type 2. Recent prolonged hospitalization 12/3-1/10 at [facility] with diarrhea, dehydration, sacral decubitus/bed bound status. Admitted on 12/06/2020 with nausea/vomiting/abdominal pain. Found to have pneumoperitoneum. Underwent laparotomy 12/12/2020 which was negative. Found to be COVID positive. 1/30, LFT's increasing and KUB revealed worsening intraperitoneal air. Back to OR on 1/31 for exploratory lap with evidence of ischemic left colon, resulting in colostomy and left colectomy. Returned to ICU requiring ongoing intubation/mechanical ventilation, two vasopressors, and currently on CRRT. Severe protein calorie malnutrition on TPN. Palliative medicine consultation for goals of care.     2/4 Successfully extubated to nasal cannula. Levo off. Patient awake, alert, oriented and able to participate in [place] discussion.   Clinical Assessment and Goals of Care:  I have reviewed medical records, discussed with care team, and met with patient and daughter Veterinary surgeon) at bedside to discuss goals of care. Patient is awake, alert, oriented and able to participate in discussion. He was successfully extubated this afternoon. No complaints of pain or discomfort.    I introduced Palliative Medicine as specialized medical care for people living with serious illness. It focuses on providing relief from the symptoms and stress of a serious illness. The goal is to improve quality of life for both the patient and the family.  We discussed a  brief life review of the patient. Not married. Has a gf [name]). Three children.  Discussed course of his health decline in the last ~1 year but especially since this past November when he was hospitalized and then again in December. Dialysis was initiated at that time. He was discharged to SNF rehab and unfortunately has been bed/chair bound since acute illness at the end of 2021. Daughter reports challenges with orthostatic hypotension contributing to his declining functional status. Also poor oral intake. Daughter reports stomach/colitis issues for years. Prior to admission, he would be transported to dialysis center and they would move him from stretcher to chair during HD, which he was tolerating.   Discussed in detail course of hospitalization including diagnoses, interventions, plan of care. Discussed recommendations from specialists especially surgery recommendations and concern with ability for wounds to heal due to poor nutritional status, bedbound status, and low albumin.   We discussed that yes he has taken strides in the right direction in two days (ability to come of levo and extubate) but many hurdles that we need to overcome including discontinuation of CRRT and his ability to tolerate intermittent dialysis including his ability to tolerate dialysis in the chair. Also watchful waiting for return of bowel function and his ability to tolerate trickle feeds and/or eventually SLP evaluation for safe diet. Explained that TPN is not  a sustainable long-term option for nutrition. We discussed high risk for future infection/sepsis with sacral wound that will be very challenging to ever heal. [name] does share her understanding that his condition is "precarious."   I attempted to elicit values and goals of care important to the patient. Advanced directives, concepts specific to code status, artifical feeding and hydration were discussed. Patient is able to clearly share his wishes with [name] and I.  He does NOT wish for CPR in the event that his heart stopped and he passed away. He would wish to pass naturally. Discussed that this was a good limitation to set with fear he would not survive these interventions, they would not impact his quality of life with current condition, and cause pain/suffering at EOL. Daughter respects and agrees with his decision for NO CPR. He is ok with shock.     At this time, if patient still had a pulse/breathing, he would desire short-term intubation. He clearly tells [name] and I he would not desire long-term ventilator support/trach placement if unable to survive off the ventilator. We discussed short vs. Long term feeding tube placement. Patient unable to make decision regarding feeding tube at this time. Discussed watchful waiting with cortrak and trickle feeds. Reassured of ongoing discussions pending ability to advance diet.   [name] requests [name] serve as his surrogate decision maker if unable to make decisions. AD packet given. They are ready to complete this documentation today if possible. [name] is also in contact with a lawyer to complete durable POA/living will documentation.   Questions answered. Hard Choices booklet, MOST form, and PMT contact information given to [name] to review. Reassured of ongoing f/u this admission pending clinical course.   **Updated Dr. Silas [name] and RN.    SUMMARY OF RECOMMENDATIONS    Patient successfully extubated this afternoon and able to participate in [place] discussion with daughter, [name].   Patient would like for daughter, [name] to be documented HCPOA. AD packet given for review. Spiritual care consult to assist with completion/notarization of this document.  Patient expresses wishes for NO CPR in the event of cardiac arrest. Partial code now. Otherwise, continue full scope treatment.   Patient would desire short-term re-intubation if necessary. He clearly tells daughter and I he would not wish for a trach  if unable to survive off the ventilator.   Ongoing palliative discussions pending clinical course. We discussed the multiple hurdles he has to overcome including:  Discontinuation of CRRT and ability to tolerate iHD and also ability to tolerate iHD in the chair.  Return of bowel function and toleration of trickle tube feeds or oral intake in the future. May need further discussions regarding prolonged artificial nutrition.   Wound care and challenges with wound healing with bedbound status, poor nutritional status, and low albumin. Discussed risk for future infection/sepsis secondary to non-healing wounds.   PMT will continue to follow inpatient. This provider off service until Monday 2/7 but will plan to follow-up with patient/daughter on Monday.    Code Status/Advance Care Planning:  Partial: NO compressions  Symptom Management:   Per attending  Palliative Prophylaxis:   Aspiration, Delirium Protocol, Frequent Pain Assessment, Oral Care and Turn Reposition  Psycho-social/Spiritual:   Desire for further Chaplaincy support: yes  Additional Recommendations: Caregiving  Support/Resources and Compassionate Wean Education  Prognosis:   Poor prognosis  Discharge Planning: To Be Determined      Primary Diagnoses: Present on Admission: . Perforated sigmoid colon ([place]) . Perforated bowel ([place])  I have reviewed the medical record, interviewed the patient and family, and examined the patient. The following aspects are pertinent.  Past Medical History:  Diagnosis Date  . Diabetes mellitus, type 2 ([place])   . ESRD on hemodialysis ([place])   . GI bleed   . Orthostatic hypotension   . PAF (paroxysmal atrial fibrillation) ([facility])    Social History   Socioeconomic History  . Marital status: Single    Spouse name: Not on file  . Number of children: Not on file  . Years of education: Not on file  . Highest education level: Not on file  Occupational History  . Not on file   Tobacco Use  . Smoking status: Never Smoker  . Smokeless tobacco: Never Used  Vaping Use  . Vaping Use: Never used  Substance and Sexual Activity  . Alcohol use: Yes    Comment: occasional  . Drug use: Never  . Sexual activity: Not on file  Other Topics Concern  . Not on file  Social History Narrative   ** Merged History Encounter **       Social Determinants of Health   Financial Resource Strain: Not on file  Food Insecurity: Not on file  Transportation Needs: Not on file  Physical Activity: Not on file  Stress: Not on file  Social Connections: Not on file   Family History  Problem Relation Age of Onset  . Hypertension Father    Scheduled Meds: . alteplase  2 mg Intracatheter Once  . vitamin C  500 mg Oral Daily  . chlorhexidine gluconate (MEDLINE KIT)  15 mL Mouth Rinse BID  . Chlorhexidine Gluconate Cloth  6 each Topical Q0600  . collagenase   Topical Daily  . feeding supplement (VITAL 1.5 CAL)  1,000 mL Per Tube Q24H  . hydrocortisone sod succinate (SOLU-CORTEF) inj  50 mg Intravenous Q12H  . insulin aspart  0-20 Units Subcutaneous Q4H  . mouth rinse  15 mL Mouth Rinse 10 times per day  . midodrine  5 mg Oral TID WC  . mupirocin ointment   Nasal BID  . pantoprazole (PROTONIX) IV  40 mg Intravenous Q24H  . sodium chloride flush  10-40 mL Intracatheter Q12H  . zinc sulfate  220 mg Oral Daily   Continuous Infusions: .  prismasol BGK 4/2.5 500 mL/hr at 01/04/21 1633  .  prismasol BGK 4/2.5 Stopped (01/03/21 1930)  . fentaNYL infusion INTRAVENOUS Stopped (01/04/21 1212)  . norepinephrine (LEVOPHED) Adult infusion 2 mcg/min (01/04/21 1800)  . piperacillin-tazobactam Stopped (01/04/21 1758)  . prismasol BGK 4/2.5 1,000 mL/hr at 01/04/21 1510  . TPN ADULT (ION) 80 mL/hr at 01/04/21 1800   PRN Meds:.fentaNYL, heparin, heparin, midazolam, midazolam, sodium chloride, sodium chloride flush Medications Prior to Admission:  Prior to Admission medications   Medication  Sig Start Date End Date Taking? Authorizing Provider  acetaminophen (TYLENOL) 325 MG tablet Take 2 tablets (650 mg total) by mouth every 6 (six) hours as needed for mild pain (or Fever >/= 101). 12/10/20  Yes Johnson, Clanford L, MD  amiodarone (PACERONE) 200 MG tablet Take 200 mg by mouth daily. 10/01/20  Yes [provider]  calcitRIOL (ROCALTROL) 0.5 MCG capsule Take 1 capsule (0.5 mcg total) by mouth daily. 12/11/20  Yes Johnson, Clanford L, MD  collagenase (SANTYL) ointment Apply topically daily. 12/10/20  Yes Johnson, Clanford L, MD  famotidine (PEPCID) 20 MG tablet Take 1 tablet (20 mg total) by mouth daily. 12/11/20  Yes Murlean Iba, MD  finasteride (PROSCAR) 5 MG tablet Take 1 tablet (5 mg total) by mouth daily. 12/11/20  Yes Johnson, Clanford L, MD  fludrocortisone (FLORINEF) 0.1 MG tablet Take 2 tablets (0.2 mg total) by mouth daily. 12/11/20  Yes Johnson, Clanford L, MD  metoprolol tartrate (LOPRESSOR) 25 MG tablet Take 0.5 tablets (12.5 mg total) by mouth at bedtime. 12/10/20  Yes Johnson, Clanford L, MD  midodrine (PROAMATINE) 10 MG tablet Take 1 tablet (10 mg total) by mouth with breakfast, with lunch, and with evening meal. 11/01/20  Yes Dessa Phi, DO  multivitamin (RENA-VIT) TABS tablet Take 1 tablet by mouth at bedtime. 12/10/20  Yes Johnson, Clanford L, MD  Nutritional Supplements (NEPRO) LIQD Take 1 Bottle by mouth 2 (two) times daily between meals.   Yes [provider]  omeprazole (PRILOSEC) 20 MG capsule Take 20 mg by mouth 2 (two) times daily.   Yes [provider]  ondansetron (ZOFRAN) 4 MG tablet Take 4 mg by mouth every 6 (six) hours as needed for nausea.  10/09/20  Yes [provider]  pyridostigmine (MESTINON) 60 MG tablet Take 1 tablet (60 mg total) by mouth daily. 12/11/20  Yes Johnson, Clanford L, MD  rosuvastatin (CRESTOR) 5 MG tablet Take 5 mg by mouth at bedtime.   Yes [provider]  feeding supplement (ENSURE ENLIVE /  ENSURE PLUS) LIQD Take 237 mLs by mouth 2 (two) times daily between meals. Patient not taking: Reported on 12/23/2020 12/10/20   Murlean Iba, MD  Nutritional Supplements (,FEEDING SUPPLEMENT, PROSOURCE PLUS) liquid Take 30 mLs by mouth with breakfast, with lunch, and with evening meal. 12/26/20   Ghimire, Henreitta Leber, MD  pantoprazole (PROTONIX) 40 MG tablet Take 1 tablet (40 mg total) by mouth 2 (two) times daily before a meal. Patient not taking: No sig reported 12/10/20   Wynetta Emery, Clanford L, MD  psyllium (HYDROCIL/METAMUCIL) 95 % PACK Take 1 packet by mouth 2 (two) times daily. 12/26/20   Ghimire, Henreitta Leber, MD   No Known Allergies Review of Systems  Unable to perform ROS: Acuity of condition   Physical Exam Vitals and nursing note reviewed.  Constitutional:      General: He is awake.     Appearance: He is cachectic. He is ill-appearing.  Cardiovascular:     Rate and Rhythm: Tachycardia present.  Pulmonary:     Effort: No tachypnea, accessory muscle usage or respiratory distress.     Comments: Extubated to nasal cannula Abdominal:     Comments: Dressing C/D/I, colostomy no output  Skin:    General: Skin is warm and dry.     Coloration: Skin is pale.     Comments: Sacral decubitus  Neurological:     Mental Status: He is alert and oriented to person, place, and time.     Vital Signs: BP 138/79   Pulse (!) 102   Temp (!) 97.2 F (36.2 C) (Axillary)   Resp 18   Ht 6' 2"  (1.88 m)   Wt 71.4 kg   SpO2 100%   BMI 20.21 kg/m  Pain Scale: 0-10 POSS *See Group Information*: 1-Acceptable,Awake and alert Pain Score: 0-No pain   SpO2: SpO2: 100 % O2 Device:SpO2: 100 % O2 Flow Rate: .O2 Flow Rate (L/min): 4 L/min  IO: Intake/output summary:   Intake/Output Summary (Last 24 hours) at 01/04/2021 1815 Last data filed at 01/04/2021 1800 Gross per 24 hour  Intake 1950.09 ml  Output 2086 ml  Net -135.91 ml    LBM:  Last BM Date: 01/03/21 Baseline Weight: Weight: 61.2 kg Most  recent weight: Weight: 71.4 kg     Palliative Assessment/Data: PPS 30%   Flowsheet Rows   Flowsheet Row Most Recent Value  Intake Tab   Referral Department Critical care  Unit at Time of Referral ICU  Palliative Care Primary Diagnosis Sepsis/Infectious Disease  Palliative Care Type Return patient Palliative Care  Reason for referral Clarify Goals of Care  Date first seen by Palliative Care 01/03/21  Clinical Assessment   Palliative Performance Scale Score 30%  Psychosocial & Spiritual Assessment   Palliative Care Outcomes   Patient/Family meeting held? Yes  Who was at the meeting? patient and daughter  Palliative Care Outcomes Clarified goals of care, Provided psychosocial or spiritual support, ACP counseling assistance       Time Total: 80 Greater than 50%  of this time was spent counseling and coordinating care related to the above assessment and plan.  Signed by:  Ihor Dow, DNP, FNP-C Palliative Medicine Team  Phone: [phone] Fax: [phone]   Please contact Palliative Medicine Team phone at [phone] for questions and concerns.  For individual provider: See Shea Evans

## 2021-01-04 NOTE — Progress Notes (Addendum)
4 Days Post-Op    CC: Pneumoperitoneum/acute hypoxic respiratory failures s/p laparotomy  Subjective: Alert and calm on the vent. Denies pain.  1u pRBC ordered this AM for hgb 6.8  Not on pressor support during my exam today - Levo is off.  Vent Mode: PRVC FiO2 (%):  [30 %] 30 % Set Rate:  [20 bmp] 20 bmp Vt Set:  [570 mL] 570 mL PEEP:  [5 cmH20] 5 cmH20 Plateau Pressure:  [16 cmH20] 16 cmH20   Objective: Vital signs in last 24 hours: Temp:  [90.32 F (32.4 C)-97.8 F (36.6 C)] 97.6 F (36.4 C) (02/04 0456) Pulse Rate:  [55-96] 69 (02/04 0800) Resp:  [15-22] 20 (02/04 0800) BP: (115-162)/(51-87) 141/77 (02/04 0700) SpO2:  [97 %-100 %] 100 % (02/04 0800) Arterial Line BP: (90-172)/(44-70) 142/58 (02/04 0818) FiO2 (%):  [30 %] 30 % (02/04 0155) Weight:  [71.4 kg] 71.4 kg (02/04 0500) Last BM Date: 01/03/21  Intake/Output from previous day: 02/03 0701 - 02/04 0700 In: 1933.6 [I.V.:1393.5; Blood:315; NG/GT:75; IV Piggyback:150.1] Out: 1688 [Drains:300] Intake/Output this shift: No intake/output data recorded.  General appearance: alert, chronically ill appearing elderly male, no distress and resting comfortably on the ventilator. Resp: Still on full vent support, lungs CTAB GI: Soft, midline incision looks clean- fascial sutures in tact. Stoma viable - no gas/stool in ostomy pouch. R blake drain with SS drainage(300cc/24h) GU: sacral wound not examined today -- see photos under media from yesterday 2/3   Lab Results:  Recent Labs    01/03/21 1308 01/04/21 0304  WBC 11.9* 12.2*  HGB 8.2* 6.8*  HCT 24.5* 21.3*  PLT 38* 32*    BMET Recent Labs    01/03/21 1641 01/04/21 0304  NA 133* 133*  K 3.9 3.8  CL 99 100  CO2 17* 18*  GLUCOSE 269* 351*  BUN 69* 77*  CREATININE 3.88* 4.25*  CALCIUM 7.3* 7.1*   PT/INR Recent Labs    01/03/21 0301 01/04/21 0304  LABPROT 15.0 15.9*  INR 1.2 1.3*    Recent Labs  Lab 12/18/2020 0325 01/01/21 0254 01/02/21 0305  01/03/21 0301 01/03/21 1641 01/04/21 0304  AST 200* 103* 58* 33  --  40  ALT 161* 108* 86* 61*  --  50*  ALKPHOS 167* 107 120 99  --  98  BILITOT 0.8 1.3* 1.2 0.9  --  2.2*  PROT 4.7* 4.5* 4.6* 4.3*  --  4.2*  ALBUMIN 1.6* 1.7* 1.7* 1.6* 1.8* 1.5*     Lipase     Component Value Date/Time   LIPASE 14 12/01/2020 0040     Medications: . alteplase  2 mg Intracatheter Once  . vitamin C  500 mg Oral Daily  . chlorhexidine gluconate (MEDLINE KIT)  15 mL Mouth Rinse BID  . Chlorhexidine Gluconate Cloth  6 each Topical Q0600  . collagenase   Topical Daily  . hydrocortisone sod succinate (SOLU-CORTEF) inj  50 mg Intravenous Q6H  . insulin aspart  0-20 Units Subcutaneous Q4H  . mouth rinse  15 mL Mouth Rinse 10 times per day  . mupirocin ointment   Nasal BID  . pantoprazole (PROTONIX) IV  40 mg Intravenous Q24H  . sodium chloride flush  10-40 mL Intracatheter Q12H  . zinc sulfate  220 mg Oral Daily    Assessment/Plan End-stage renal disease Chronic diarrhea/colitis since 10/2020 Hx orthostatic hypotension Hx PAF-on anticoagulation, currently held Covid positive Bedbound Anemia/thrombocytopenia  - H/H 9.4/28.9>>7.8/23.6>>6.3/19.1  - platelets 65K>>44K>>36K Protein calorie malnutrition -severe(prealbumin<5)  Pneumoperitoneum, pneumatosis, portal venous gas s/p negativeexploratory laparotomy1/20 Dr. Bobbye Morton; POD #14 - intraop: no acute findings, no contamination, excellent blood flow to intestine,congested rectosigmoid colon  -Worsening pneumoperitoneum Exploratory laparotomy, left colectomy, end colostomy, 12/06/2020 Dr. Rolm Bookbinder POD #4 - post-op ileus, await ROBF, continue TPN  Chronic stage IV sacral decubitus pressure ulcer (12/10/20, first picture AP[facility]) - wound evaluated 2/3, peri wound with some necrosis, base overall clean, draining purulent material. He is not ill/septic from this. May benefit from debridement in the OR at some point if the patient shows  overall clinical improvement but would not recommend right now.  ID-zosyn 1/20-1/25, restart 1/31>> day 5 (plan to stop abx at [place], may FEN- npo/TPN VTE-SCD's, chemical VTE held due to anemia requiring transfusion and thrombocytopenia (plts 32)  Plan: Continue acute care per CCM.  Await return of bowel function. Noted plans for [place] meeting with daughter today - will follow.   Jill Alexanders 01/04/2021 Please see Amion

## 2021-01-04 NOTE — Procedures (Signed)
Cortrak  Person Inserting Tube:  Emilina Smarr, RD Tube Type:  Cortrak - 43 inches Tube Location:  Left nare Initial Placement:  Stomach Secured by: Bridle Technique Used to Measure Tube Placement:  Documented cm marking at nare/ corner of mouth Cortrak Secured At:  75 cm   No x-ray is required. RN may begin using tube.   If the tube becomes dislodged please keep the tube and contact the Cortrak team at [email or web address] ([email or web address]) for replacement.  If after hours and replacement cannot be delayed, place a NG tube and confirm placement with an abdominal x-ray.    Shady Padron RD, LDN Clinical Nutrition Pager listed in AMION    

## 2021-01-04 NOTE — Progress Notes (Signed)
PHARMACY - TOTAL PARENTERAL NUTRITION CONSULT NOTE   Indication: Massive bowel resection and severe malnutrition  Patient Measurements: Height: 6\' 2"  (188 cm) Weight: 71.4 kg (157 lb 6.5 oz) IBW/kg (Calculated) : 82.2 TPN AdjBW (KG): 69.2 Body mass index is 20.21 kg/m.  Assessment: 70 year old male with diabetes, ESRDon HD, TTS,chronic diarrhea, chronic hypotension, PAF - systemic AC on hold w/hx of GIB, admitted 1/19 from SNF with N/V and abd pain found to have pneumoperitoneum. Underwent laparotomy 1/20 without findings of bowel perforation. Incidentally COVID positive. He was transferred to [facility] on 1/22. He was doing better however his LFTs elevated on 1/30 and KUB 1/31 showed worsening intraperitoneal air. Of note he has previously had multiple ulcers in the sigmoid colon of nonspecific etiology. He was taken back to the OR 1/31 and underwent exploratory laparotomy with end colostomy and left colectomy- noting that entire left colon was boggy thought secondary to ischemia; also noted to have ulcerated area on anterior rectum. Pharmacy consulted for TPN.   Glucose / Insulin: CBGs since TPN initiated 113-351. 23 units sensitive SSI will change to resistant SSI Also noted to be on stress steroids - off pressors may start to wean.  Electrolytes: Na 132, K 3.8 (removed in TPN), HCO3 18, Phos 3.6 (removed in TPN), Mg 1.8 Renal: ESRD - now on CRRT LFTs / TGs: LFTs elevated- trending down (40/50), TBili trending up 2.2 Prealbumin / albumin: Pre-albumin <5/ albumin 1.5  Intake / Output; MIVF: Levophed off this am. CRRT planned, but held overnight due to catheter issues, UOP today. Net +3L. LBM 2/2. GI Imaging: 2/1 Abd Xray - Stable massive pneumoperitoneum Surgeries / Procedures: none since start of TPN  Central access: LIJ triple lumen CVC placed 01/01/21 TPN start date: 01/01/21  Nutritional Goals(per RD recommendation on 2/3) Kcal:1900-2200kcals; Protein:140-170 g; Fluid:1000 mL  plus UOP Goal rate:  ~80 cc/hr with ILE 50g on MWF only will provide an average of 2080 kcal and 140g protein per day  Current Nutrition:  NPO  Plan:  Increase concentrated TPN to 35mL/hr at 1800 Given shortage of ILE - will provide lipids on MWF only - will receive lipids tonight Electrolytes in TPN: increase to 25mEq/L of Na, 55mEq/L of K, 66mEq/L of Ca, add 4 mEq/L of Mg, and 24mmol/L of Phos. Maximize Acetate Add standard MVI and trace elements to TPN Sensitive SSI q4h change to resistant SSI due to increased glucose on higher TPN rate and steroids - will plan to adjust as needed  Monitor TPN labs on Mon/Thurs, and daily for first several days  Thank you for allowing pharmacy to be a part of this patient's care.  Alanda Slim, PharmD, [facility] Clinical Pharmacist Please see AMION for all Pharmacists' Contact Phone Numbers 01/04/2021, 7:16 AM

## 2021-01-04 NOTE — Progress Notes (Signed)
Physical Therapy Wound Treatment Patient Details  Name: [name] MRN: [id number] Date of Birth: 1951-06-01  Today's Date: 01/04/2021 Time: 4098-1191 Time Calculation (min): 37 min  Subjective  Subjective: Pt on vent Patient and Family Stated Goals: heal wound Date of Onset:  (unknown - pt reports at least a month) Prior Treatments: unknown  Pain Score: Pain Score: 2/10 premedicated.   Wound Assessment  Pressure Injury 12/05/20 Coccyx Medial;Upper Unstageable - Full thickness tissue loss in which the base of the injury is covered by slough (yellow, tan, gray, green or brown) and/or eschar (tan, brown or black) in the wound bed. prevous stage 2 on sacrum (Active)  Dressing Type ABD;Barrier Film (skin prep);Gauze (Comment);Moist to dry;Santyl 01/04/21 1355  Dressing Changed;Clean 01/04/21 1355  Dressing Change Frequency Twice a day 01/04/21 1355  State of Healing Eschar 01/04/21 1355  Site / Wound Assessment Yellow;Red;Purple;Black 01/04/21 1355  % Wound base Red or Granulating 50% 01/04/21 1355  % Wound base Yellow/Fibrinous Exudate 35% 01/04/21 1355  % Wound base Black/Eschar 15% 01/04/21 1355  % Wound base Other/Granulation Tissue (Comment) 0% 01/04/21 1355  Peri-wound Assessment Bleeding;Erythema (non-blanchable) 01/03/21 2000  Wound Length (cm) 10 cm 01/02/21 1220  Wound Width (cm) 8 cm 01/02/21 1220  Wound Depth (cm) 1 cm 01/02/21 1220  Wound Surface Area (cm^2) 80 cm^2 01/02/21 1220  Wound Volume (cm^3) 80 cm^3 01/02/21 1220  Tunneling (cm) 5 cm at 8 o clock 12/26/20 1253  Undermining (cm) 4+ cm at 7-12 oclock, 2 cm at 12 to 6 oclock 01/04/21 1355  Margins Unattached edges (unapproximated) 01/03/21 2000  Drainage Amount Moderate 01/04/21 1355  Drainage Description Purulent 01/04/21 1355  Treatment Cleansed;Debridement (Selective);Hydrotherapy (Pulse lavage);Packing (Saline gauze) 01/04/21 1355   Santyl applied to wound bed prior to applying dressing.     Hydrotherapy Pulsed lavage therapy - wound location: sacrum Pulsed Lavage with Suction (psi): 12 psi (4-12) Pulsed Lavage with Suction - Normal Saline Used: 1000 mL Pulsed Lavage Tip: Tip with splash shield Selective Debridement Selective Debridement - Location: sacrum Selective Debridement - Tools Used: Scalpel;Scissors;Forceps Selective Debridement - Tissue Removed: eschar, brown slough, black necrotic edges   Wound Assessment and Plan  Wound Therapy - Assess/Plan/Recommendations Wound Therapy - Clinical Statement: Copious amounts of purulence noted from area of undermining 7-12 occlock, skin surrounding wound continues to deteriorate, debrided extensively. Continued yellow slough removal from undermining areas of wound, bleeding easily with increased drainage. Will continue hydro and selective debridement to both clean up necrosis and decrease the bioburden. Wound Therapy - Functional Problem List: global weakness and immobility Factors Delaying/Impairing Wound Healing: Immobility;Multiple medical problems Hydrotherapy Plan: Debridement;Dressing change;Patient/family education;Pulsatile lavage with suction Wound Therapy - Frequency: 6X / week Wound Therapy - Follow Up Recommendations: Skilled nursing facility Wound Plan: [name] d/c'd, but discharged held to continue debridement and management of notable infection.  Wound Therapy Goals- Improve the function of patient's integumentary system by progressing the wound(s) through the phases of wound healing (inflammation - proliferation - remodeling) by: Decrease Necrotic Tissue to: 40 Decrease Necrotic Tissue - Progress: Progressing toward goal Increase Granulation Tissue to: 60 Increase Granulation Tissue - Progress: Progressing toward goal Goals/treatment plan/discharge plan were made with and agreed upon by patient/family: Yes Time For Goal Achievement: 7 days Wound Therapy - Potential for Goals: Good  Goals will be updated until  maximal potential achieved or discharge criteria met.  Discharge criteria: when goals achieved, discharge from hospital, MD decision/surgical intervention, no progress towards goals, refusal/missing three consecutive treatments  without notification or medical reason.  GP   01/04/2021  Ginger Carne., PT [facility] Services [phone]  (pager) [phone]  (office)  [name] Zadie Deemer 01/04/2021, 2:01 PM

## 2021-01-04 NOTE — Progress Notes (Signed)
CRITICAL VALUE ALERT  Critical Value:  Hgb 6.8  Date & Time Notied: 02/4 0335  Provider Notified: E-link RN   Orders Received/Actions taken: RN will notify MD. Waiting for orders.

## 2021-01-04 NOTE — Progress Notes (Signed)
Patient ID: [name], male   DOB: February 26, 1951, 70 y.o.   MRN: [id number] S: Did not have CRRT overnight due to issues with HD catheter thought to be positional by IV team. O:BP 126/81   Pulse 72   Temp 97.6 F (36.4 C) (Axillary)   Resp 20   Ht 6' 2"  (1.88 m)   Wt 71.4 kg   SpO2 100%   BMI 20.21 kg/m   Intake/Output Summary (Last 24 hours) at 01/04/2021 0956 Last data filed at 01/04/2021 0900 Gross per 24 hour  Intake 2089.1 ml  Output 1668 ml  Net 421.1 ml   Intake/Output: I/O last 3 completed shifts: In: 2732 [I.V.:2042; Blood:315; NG/GT:75; IV Piggyback:300.1] Out: 5726 [Emesis/NG output:40; Drains:400; Other:1388]  Intake/Output this shift:  Total I/O In: 136.9 [I.V.:136.9] Out: 50 [Drains:50] Weight change: 2.6 kg Gen: intubated, awake, no complaints CVS: RRR Resp: cta Abd: +BS, soft, NT/ND, colostomy in LLQ Ext: 1+ edema  Recent Labs  Lab 12/29/20 0446 12/30/20 0327 12/02/2020 0325 12/05/2020 1335 12/12/2020 1724 12/23/2020 1751 01/01/21 0254 01/02/21 0305 01/03/21 0301 01/03/21 1641 01/04/21 0304  NA 132* 134* 134*   < > 132* 132* 133* 131* 132* 133* 133*  K 4.4 4.3 4.3   < > 4.2 4.2 4.7 5.2* 4.0 3.9 3.8  CL 100 101 100  --  100  --  100 98 99 99 100  CO2 19* 19* 16*  --  16*  --  14* 14* 17* 17* 18*  GLUCOSE 115* 122* 122*  --  167*  --  131* 216* 237* 269* 351*  BUN 55* 66* 79*  --  85*  --  88* 104* 81* 69* 77*  CREATININE 5.05* 5.42* 6.18*  --  6.14*  --  6.06* 6.45* 4.92* 3.88* 4.25*  ALBUMIN 1.6* 1.7* 1.6*  --   --   --  1.7* 1.7* 1.6* 1.8* 1.5*  CALCIUM 7.5* 7.6* 7.6*  --  7.5*  --  7.3* 7.5* 7.1* 7.3* 7.1*  PHOS 4.9*  --   --   --   --   --  6.1* 7.2* 5.2* 4.0 3.6  AST  --  239* 200*  --   --   --  103* 58* 33  --  40  ALT  --  175* 161*  --   --   --  108* 86* 61*  --  50*   < > = values in this interval not displayed.   Liver Function Tests: Recent Labs  Lab 01/02/21 0305 01/03/21 0301 01/03/21 1641 01/04/21 0304  AST 58* 33  --  40  ALT 86*  61*  --  50*  ALKPHOS 120 99  --  98  BILITOT 1.2 0.9  --  2.2*  PROT 4.6* 4.3*  --  4.2*  ALBUMIN 1.7* 1.6* 1.8* 1.5*   No results for input(s): LIPASE, AMYLASE in the last 168 hours. No results for input(s): AMMONIA in the last 168 hours. CBC: Recent Labs  Lab 01/01/21 0254 01/01/21 1730 01/02/21 0305 01/03/21 0439 01/03/21 1308 01/04/21 0304 01/04/21 0834  WBC 10.3  --  10.9* 11.6* 11.9* 12.2*  --   NEUTROABS 9.5*  --  9.9* 10.6*  --  11.0*  --   HGB 7.8*  --  7.7* 6.3* 8.2* 6.8* 7.8*  HCT 23.6*  --  24.0* 19.1* 24.5* 21.3* 24.4*  MCV 92.2  --  94.9 95.5 93.2 95.5  --   PLT 44*   < > 49*  36* 38* 32*  --    < > = values in this interval not displayed.   Cardiac Enzymes: No results for input(s): CKTOTAL, CKMB, CKMBINDEX, TROPONINI in the last 168 hours. CBG: Recent Labs  Lab 01/03/21 1603 01/03/21 1950 01/03/21 2341 01/04/21 0317 01/04/21 0820  GLUCAP 240* 113* 333* 329* 291*    Iron Studies: No results for input(s): IRON, TIBC, TRANSFERRIN, FERRITIN in the last 72 hours. Studies/Results: No results found. Marland Kitchen alteplase  2 mg Intracatheter Once  . vitamin C  500 mg Oral Daily  . chlorhexidine gluconate (MEDLINE KIT)  15 mL Mouth Rinse BID  . Chlorhexidine Gluconate Cloth  6 each Topical Q0600  . collagenase   Topical Daily  . hydrocortisone sod succinate (SOLU-CORTEF) inj  50 mg Intravenous Q6H  . insulin aspart  0-20 Units Subcutaneous Q4H  . mouth rinse  15 mL Mouth Rinse 10 times per day  . mupirocin ointment   Nasal BID  . pantoprazole (PROTONIX) IV  40 mg Intravenous Q24H  . sodium chloride flush  10-40 mL Intracatheter Q12H  . zinc sulfate  220 mg Oral Daily    BMET    Component Value Date/Time   NA 133 (L) 01/04/2021 0304   K 3.8 01/04/2021 0304   CL 100 01/04/2021 0304   CO2 18 (L) 01/04/2021 0304   GLUCOSE 351 (H) 01/04/2021 0304   BUN 77 (H) 01/04/2021 0304   CREATININE 4.25 (H) 01/04/2021 0304   CALCIUM 7.1 (L) 01/04/2021 0304   GFRNONAA 14  (L) 01/04/2021 0304   GFRAA 19 (L) 06/15/2018 0407   CBC    Component Value Date/Time   WBC 12.2 (H) 01/04/2021 0304   RBC 2.23 (L) 01/04/2021 0304   HGB 7.8 (L) 01/04/2021 0834   HCT 24.4 (L) 01/04/2021 0834   PLT 32 (L) 01/04/2021 0304   MCV 95.5 01/04/2021 0304   MCH 30.5 01/04/2021 0304   MCHC 31.9 01/04/2021 0304   RDW 19.2 (H) 01/04/2021 0304   LYMPHSABS 0.4 (L) 01/04/2021 0304   MONOABS 0.7 01/04/2021 0304   EOSABS 0.0 01/04/2021 0304   BASOSABS 0.0 01/04/2021 0304     OP HD: TTS [facility] stretcher dialysis 66.5kg dry wt per OP HD RN/ R IJ TDC   Assessment/Plan:  1. Ischemic bowel with Pneumoperitoneum/ shock -sp exlap, no perforated viscous found.repeat Abd films with worsening pneumoperitoneum ands/p left colectomy/colostomy 12/06/2020. 2. VDRF- currently on vent post op per PCCM 3. ESRD - on HD TTS.Not able to UF with IHD 1. Initiated CRRT yesterday, however did not get much due to issues with HD catheter 2. Will restart today and if still having issues with [facility] will consult IR or have PCCM place temp catheter (since he is on vent). 4. Covid positive - off isolation 5. Orthostatic hypotension - chronic issue, pt is bed-bound it appears and does stretcher dialysis w/ the group in [facility]. On midodrine and fluorinef outpatient per charting.  6. BP /volume - has sig LE/ UE edema, up 1-2kg by wt's, no resp issues. Unable to UF large amts due to low alb/ 3rd spacing of fluid.  7. DM2 - per primary team  8. Anemia ckd - no emergent need for PRBC's. need records re: ESA dosing.  9. Hypocalcemia - repleted; added ca bath 10. Prognosis - very poorand has been seen by Palliative care in the pastand no evidence of improvement.He has severe autonomic dysfunction and is predominantly bed bound and can only get  dialysis on a stretcher. Now with perforated colon s/p colectomy/colostomy, ongoing hypotension and hypoalbuminemia with poor chance of meaningful  recovery or healing.Will reconsult for possible transition to comfort care.   Donetta Potts, MD [name] [phone]

## 2021-01-04 NOTE — Procedures (Signed)
Extubation Procedure Note  Patient Details:   Name: [name] DOB: [birth year] MRN: [id number]   Airway Documentation:    Vent end date: 01/04/21 Vent end time: 1218   Evaluation  O2 sats: stable throughout Complications: No apparent complications Patient did tolerate procedure well. Bilateral Breath Sounds: Clear,Diminished   Yes   Pt extubated per MD order. No complications during extubation, positive cuff leak.  Esperanza Sheets T 01/04/2021, 12:26 PM

## 2021-01-04 NOTE — Progress Notes (Signed)
eLink Physician-Brief Progress Note Patient Name: [name] DOB: 14-Dec-1950 MRN: [id number]   Date of Service  01/04/2021  HPI/Events of Note  Hemoglobin 6.8 gm / dl.  eICU Interventions  1 unit PRBC ordered transfused.        Kerry Kass Cerra Eisenhower 01/04/2021, 4:02 AM

## 2021-01-04 NOTE — Progress Notes (Signed)
Nutrition Follow-up / Consult  DOCUMENTATION CODES:   Severe malnutrition in context of chronic illness  INTERVENTION:    TPN dosing per Pharmacy  Vitamin C 500 mg daily  Zinc 220 mg daily  Start trickle TF via NG tube with Vital 1.5 at 20 ml/h. When able, recommend increase by 10 ml every 4 hours to goal rate of 55 ml/h with Prosource TF 90 ml TID to provide 2220 kcal, 155 gm protein, 1008 ml free water daily  NUTRITION DIAGNOSIS:   Severe Malnutrition related to chronic illness (ESRD on HD) as evidenced by severe muscle depletion,severe fat depletion.  Ongoing   GOAL:   Patient will meet greater than or equal to 90% of their needs  Met  MONITOR:   Vent status,Labs,Skin,I & O's  REASON FOR ASSESSMENT:   Consult Enteral/tube feeding initiation and management  ASSESSMENT:   69 yo male admitted with with pneumoperitoneum, shock, acute metabolic acidosis, respiratory failure due to volume overload- ESRD/HD (started 10/2020), Pt missed HD x 2 prior to admission.Pt also COVID+.  PMH includes DM, unstageable sacral PI  Discussed patient in ICU rounds and with RN today. Plans to begin trickle tube feeding today. Received MD Consult for TF initiation and management. NG tube in place. Remains on CRRT.  Receiving TPN at 65 ml/h to provide 1633 kcal and 110 gm protein. Receiving lipids three times per week due to ILE shortage. Will receive ILE with TPN starting at 6pm today.  Patient remains intubated on ventilator support MV: 7 L/min Temp (24hrs), Avg:95 F (35 C), Min:90.32 F (32.4 C), Max:97.7 F (36.5 C)  Labs reviewed. Na 133, BUN 77, Creat 4.25 CBG: 329-291  Medications reviewed and include solucortef, novolog, protonix, levophed, vitamin C, zinc.  Admission weight 61.2 kg, current weight 71.4 kg I/O +4.7 L JP drain 300 ml output x 24 hours UOP 0 ml x 24 hours  Diet Order:   Diet Order            Diet NPO time specified Except for: Sips with Meds  Diet  effective now                 EDUCATION NEEDS:   Not appropriate for education at this time  Skin:  Skin Assessment: Skin Integrity Issues: Skin Integrity Issues:: Other (Comment),Incisions Unstageable: sacrum Incisions: abdomen Other: L great toe ulcer  Last BM:  2/3 type 6; no documented output from colostomy   Height:   Ht Readings from Last 1 Encounters:  01/01/21 6' 2"  (1.88 m)    Weight:   Wt Readings from Last 1 Encounters:  01/04/21 71.4 kg    Ideal Body Weight:  86.4 kg  BMI:  Body mass index is 20.21 kg/m.  Estimated Nutritional Needs:   Kcal:  1900-2200  Protein:  140-170 gm  Fluid:  1000 mL plus UOP   Lucas Mallow, RD, LDN, CNSC Please refer to Amion for contact information.

## 2021-01-04 NOTE — Progress Notes (Signed)
NAME:  [name], MRN:  [id number], DOB:  [birth year], LOS: 5 ADMISSION DATE:  12/13/2020, CONSULTATION DATE: 12/08/2020 REFERRING MD:  Jesusita Oka, MD, CHIEF COMPLAINT: Acute hypoxic respiratory failure post laparotomy  Brief History:  70 year old male with end-stage renal disease on dialysis TTS, admitted with nausea, vomiting, and abdominal pain, noted to have pneumoperitoneum.  Underwent laparotomy 1/20 which was negative.  Incidentally found to be COVID-positive. On 1/30, his LFTs started to elevate and KUB showed worsening intraperitoneal air.  He was taken back to OR 1/31 for ex lap with end colostomy and left colectomy.  Returns to ICU on two vasopressors and remains intubated secondary to hemodynamic instability.   History of Present Illness:  70 year old male with diabetes, ESRD on HD, TTS, chronic diarrhea, chronic hypotension, PAF - systemic AC on hold w/hx of GIB, admitted 1/19 from SNF with N/V and abd pain found to have pneumoperitoneum.   Prior to this he had prolonged hospitalization 12/3- 1/10  at [facility] with diarrhea, dehydration/ hypotension, deconditioning being bed bound with sacral decub, then previously 11/12- 12/2 with syncope and colitis.   Underwent laparotomy 1/20 without findings of bowel perforation.  Incidentally COVID positive.  He was transferred to [facility] on 1/22.  He was doing better however his LFTs elevated on 1/30 and KUB 1/31 showed worsening intraperitoneal air.  Of note he has previously had multiple ulcers in the sigmoid colon of nonspecific etiology.  He was taken back to the OR today, 1/31 and underwent exploratory laparotomy with end colostomy and left colectomy- noting that entire left colon was boggy thought secondary to ischemia; also noted to have ulcerated area on anterior rectum.  Post operative, he returns to ICU on two vasopressors and remains intubated due to hemodynamic instability. EBL 50 ml.  PCCM re-consulted for ICU management.   Past Medical  History:  End-stage renal disease on dialysis Diabetes type 2 Paroxysmal A. fib Orthostatic hypotension Unstageable sacral decubitus ulcer (POA)  Significant Hospital Events:  1/19>> Admit to [facility] for abdominal pain-pneumoperitoneum on imaging studies- 1/20 exploratory laparotomy negative/ extubated  1/21 iHD, low dose phenyl needed, weaned off after iHD stopped, started on stress dose steroids - got 1 dose and was stopped, resumed home midodrine 1/22>> transferred to [facility] service. 1/26>> plans to discharge back to SNF-however upon further evaluation-needed inpatient hydrotherapy for worsening sacral wound. 1/27>> general surgery consulted for sacral wound-continue hydrotherapy for few more days. 1/31 Back to the OR for Pneumoperitoneum ( Exp. Lap Left colectomy, end colectomy), to ICU for shock post op, remains on vent  Consults:  [facility] Nephrology   Procedures:  1/10 right subclavian DL TDC >>  1/20 ex lap >> neg 1/31 ex lap >> end colostomy/ left colectomy   ETT 1/20; 1/31 >> Left radial aline 1/31 >>  Significant Diagnostic Tests:  1/20 CT abdomen and pelvis: There is a focal area of wall thickening with adjacent perforation of the distal descending colon at the junction of the sigmoid colon, likely the transition point. 2. There is moderately dilated sigmoid colon and rectum which is stool-filled and suggestion of possible pneumatosis. 3. Free air seen under the right hemidiaphragm and within the porta hepatis. 4. Small amount of air seen within the inferior right liver lobe which is concerning for portal venous gas given the patient's findings 5. Bilateral sacral decubitus ulceration with phlegmon and subcutaneous emphysema extending to the inferior coccyx.  1/31 KUB >> Massive pneumoperitoneum again noted. Note that this typically more than  would be expected given laparotomy on 12/27/2020 and an ongoing air leak secondary to visceral perforation should be considered.   Repeat CT imaging with oral contrast may be helpful to identify the source of leak.  Suspected long segment colitis involving the distal descending and sigmoid colon.  2/1 KUB>> stable massive pneumoperitoneum; gastric tube in stomach   Micro Data:  1/20 influenza PCR negative 1/20 COVID PCR positive 1/21 MRSA PCR >> positive  1/31 BCx2 >>  Antimicrobials:  Zosyn 1/19 > 1/24; 1/31 >>  Interim History / Subjective:   HD catheter positional, CRRT stopped overnight. Resumed this AM. Extubated. No role for additional surgical intervention. Family meeting with palliative care, code status changed to no CPR  Objective   Blood pressure 138/79, pulse (!) 110, temperature 97.6 F (36.4 C), temperature source Axillary, resp. rate (!) 21, height 6\' 2"  (1.88 m), weight 71.4 kg, SpO2 98 %.    Vent Mode: PSV;CPAP FiO2 (%):  [30 %] 30 % Set Rate:  [20 bmp] 20 bmp Vt Set:  [570 mL] 570 mL PEEP:  [5 cmH20] 5 cmH20 Pressure Support:  [5 cmH20] 5 cmH20 Plateau Pressure:  [16 cmH20] 16 cmH20   Intake/Output Summary (Last 24 hours) at 01/04/2021 1419 Last data filed at 01/04/2021 1400 Gross per 24 hour  Intake 1743.89 ml  Output 1426 ml  Net 317.89 ml   Filed Weights   01/02/21 0956 01/03/21 0314 01/04/21 0500  Weight: 68.8 kg 70 kg 71.4 kg   Physical Exam: General: Chronically ill-appearing, cachectic, thin malnourished, no acute distress HENT: [place], AT, ETT in place Eyes: EOMI, no scleral icterus Respiratory: Clear to auscultation bilaterally.  No crackles, wheezing or rales Cardiovascular: RRR, -M/R/G, no JVD GI: BS+, open abdomen, left lower quadrant ostomy, JP drain in place with bloody drainage Extremities: +3+ pitting edema in upper and lower extremities,-tenderness, left distal digit dry ischemia Neuro: Awake and alert, follows commands Skin: Sacral wound not visualized   Resolved Hospital Problem list   Large bowel obstruction status post exploratory laparotomy Acute metabolic  acidosis COVID 19 infection- off isolation   Assessment & Plan:  Pneumoperitoneum s/p ex-lap resulting in left colectomy with end colostomy  Ischemic bowel - s/p end colostomy and left colectomy 1/31, evidence of ischemic left colon - Management per [facility] - no role for additional surger -TPN per pharmacy started 2/1  - follow surgical pathology 1/31 in process - BID dressing changes - trickle TF 2/4  Septic shock, hx orthostatic: multifactorial- poor nutrition, chronic hypotension, AI, sedation, rule out intra-abdominal infection given microperforation/ pneumoperitoneum, sacral ulcer - wean stress dose steroids to end 2/6 - trend CBC - follow blood cultures, ngtd - Zosyn per pharmacy for 5 days( start date 1/31)  Acute hypoxic respiratory insufficiency post-operatively, previously due to volume overload from missing HD session Open abdomen -Extubated 2/4 - volume removal per CRRT  ESRD on TTS (new to HD since 10/2020) - CRRT per nephrology - trend renal indices  - continue foley for now, if ongoing low UOP, consider d/c foley > bladder scan, however UOP trend 1L-> 25 ml  Diabetes type 2: - SSI sensitive   PAF w/RVR - remains in NSR - PO amiodarone held - hold betablocker - no systemic AC given prior GIB  Sacral wound- un-stageable, present on admission - per WOC/ surgery recommendations, hydrotherapy as tolerated  - no role for OR per surgery  Anemia- multifactorial, of chronic illness, ABLA - transfused 1 unit 1/29 , 1/30, 2/3 -  remains on PPI BID - trend CBC - transfuse for Hgb < 7  Thrombocytopenia - suspect sepsis -  Platelets trend stable, continue to monitor/ trend  -  Transfuse for goal plt >10k  Chronic debility/ deconditioning -  mostly bed bound now, PT/ OT  Protein calorie malnutrition>> severe - TPN per pharmacy  - TF trickle 2/4  Best practice (evaluated daily)  Diet: NPO, TPN, TF Pain/Anxiety/Delirium protocol (if indicated): n/a VAP protocol  (if indicated): n/a DVT prophylaxis: SCDs only  GI prophylaxis: PPI  Glucose control: SSI sensitive Mobility: PT/OT Disposition: ICU  Goals of Care:   Code Status: Full code Poor prognosis, Goals of care discussion with patient 2/4 limits to no CPR, otherwise would want everything else.  Labs   CBC: Recent Labs  Lab 12/24/2020 1724 12/07/2020 1751 01/01/21 0254 01/01/21 1730 01/02/21 0305 01/03/21 0439 01/03/21 1308 01/04/21 0304 01/04/21 0834  WBC 8.4  --  10.3  --  10.9* 11.6* 11.9* 12.2*  --   NEUTROABS 7.7  --  9.5*  --  9.9* 10.6*  --  11.0*  --   HGB 8.5*   < > 7.8*  --  7.7* 6.3* 8.2* 6.8* 7.8*  HCT 25.9*   < > 23.6*  --  24.0* 19.1* 24.5* 21.3* 24.4*  MCV 92.5  --  92.2  --  94.9 95.5 93.2 95.5  --   PLT 55*  51*  --  44* 50* 49* 36* 38* 32*  --    < > = values in this interval not displayed.    Basic Metabolic Panel: Recent Labs  Lab 12/09/2020 0325 12/15/2020 1335 01/01/21 0254 01/02/21 0305 01/03/21 0301 01/03/21 1641 01/04/21 0304  NA 134*   < > 133* 131* 132* 133* 133*  K 4.3   < > 4.7 5.2* 4.0 3.9 3.8  CL 100   < > 100 98 99 99 100  CO2 16*   < > 14* 14* 17* 17* 18*  GLUCOSE 122*   < > 131* 216* 237* 269* 351*  BUN 79*   < > 88* 104* 81* 69* 77*  CREATININE 6.18*   < > 6.06* 6.45* 4.92* 3.88* 4.25*  CALCIUM 7.6*   < > 7.3* 7.5* 7.1* 7.3* 7.1*  MG 2.0  --  1.9 1.9 1.8  --  1.8  PHOS  --   --  6.1* 7.2* 5.2* 4.0 3.6   < > = values in this interval not displayed.   GFR: Estimated Creatinine Clearance: 16.3 mL/min (A) (by C-G formula based on SCr of 4.25 mg/dL (H)). Recent Labs  Lab 12/29/20 0446 12/29/20 0728 12/16/2020 1724 01/01/21 0254 01/02/21 0305 01/03/21 0439 01/03/21 1308 01/04/21 0304  WBC 10.1   < > 8.4   < > 10.9* 11.6* 11.9* 12.2*  LATICACIDVEN 2.9*  --  1.5  --   --   --   --   --    < > = values in this interval not displayed.    Liver Function Tests: Recent Labs  Lab 12/11/2020 0325 01/01/21 0254 01/02/21 0305 01/03/21 0301  01/03/21 1641 01/04/21 0304  AST 200* 103* 58* 33  --  40  ALT 161* 108* 86* 61*  --  50*  ALKPHOS 167* 107 120 99  --  98  BILITOT 0.8 1.3* 1.2 0.9  --  2.2*  PROT 4.7* 4.5* 4.6* 4.3*  --  4.2*  ALBUMIN 1.6* 1.7* 1.7* 1.6* 1.8* 1.5*   No results for input(s): LIPASE,  AMYLASE in the last 168 hours. No results for input(s): AMMONIA in the last 168 hours.  ABG    Component Value Date/Time   PHART 7.420 12/27/2020 1751   PCO2ART 28.5 (L) 12/18/2020 1751   PO2ART 476 (H) 12/14/2020 1751   HCO3 18.8 (L) 12/02/2020 1751   TCO2 20 (L) 12/17/2020 1751   ACIDBASEDEF 5.0 (H) 12/22/2020 1751   O2SAT 100.0 12/28/2020 1751     Coagulation Profile: Recent Labs  Lab 01/01/21 0254 01/01/21 1730 01/02/21 0305 01/03/21 0301 01/04/21 0304  INR 1.2 1.1 1.1 1.2 1.3*    Cardiac Enzymes: No results for input(s): CKTOTAL, CKMB, CKMBINDEX, TROPONINI in the last 168 hours.  HbA1C: Hgb A1c MFr Bld  Date/Time Value Ref Range Status  12/21/2020 12:08 PM 4.1 (L) 4.8 - 5.6 % Final    Comment:    (NOTE) Pre diabetes:          5.7%-6.4%  Diabetes:              >6.4%  Glycemic control for   <7.0% adults with diabetes   06/13/2018 03:16 AM 6.6 (H) 4.8 - 5.6 % Final    Comment:    (NOTE) Pre diabetes:          5.7%-6.4% Diabetes:              >6.4% Glycemic control for   <7.0% adults with diabetes     CBG: Recent Labs  Lab 01/03/21 1603 01/03/21 1950 01/03/21 2341 01/04/21 0317 01/04/21 0820  GLUCAP 240* 113* 333* 329* 291*    CRITICAL CARE Performed by: Bonna Gains Lilianah Buffin   Total critical care time: 35 minutes  Critical care time was exclusive of separately billable procedures and treating other patients.  Critical care was necessary to treat or prevent imminent or life-threatening deterioration.  Critical care was time spent personally by me on the following activities: development of treatment plan with patient and/or surrogate as well as nursing, discussions with  consultants, evaluation of patient's response to treatment, examination of patient, obtaining history from patient or surrogate, ordering and performing treatments and interventions, ordering and review of laboratory studies, ordering and review of radiographic studies, pulse oximetry and re-evaluation of patient's condition.    Lanier Clam, MD

## 2021-01-05 DIAGNOSIS — K631 Perforation of intestine (nontraumatic): Secondary | ICD-10-CM | POA: Diagnosis not present

## 2021-01-05 DIAGNOSIS — J9601 Acute respiratory failure with hypoxia: Secondary | ICD-10-CM | POA: Diagnosis not present

## 2021-01-05 DIAGNOSIS — N189 Chronic kidney disease, unspecified: Secondary | ICD-10-CM

## 2021-01-05 DIAGNOSIS — E43 Unspecified severe protein-calorie malnutrition: Secondary | ICD-10-CM | POA: Diagnosis not present

## 2021-01-05 DIAGNOSIS — A419 Sepsis, unspecified organism: Secondary | ICD-10-CM | POA: Diagnosis not present

## 2021-01-05 DIAGNOSIS — Z992 Dependence on renal dialysis: Secondary | ICD-10-CM

## 2021-01-05 DIAGNOSIS — N179 Acute kidney failure, unspecified: Secondary | ICD-10-CM

## 2021-01-05 LAB — TYPE AND SCREEN
ABO/RH(D): A POS
Antibody Screen: NEGATIVE
Unit division: 0
Unit division: 0

## 2021-01-05 LAB — RENAL FUNCTION PANEL
Albumin: 1.6 g/dL — ABNORMAL LOW (ref 3.5–5.0)
Albumin: 1.6 g/dL — ABNORMAL LOW (ref 3.5–5.0)
Anion gap: 14 (ref 5–15)
Anion gap: 14 (ref 5–15)
BUN: 56 mg/dL — ABNORMAL HIGH (ref 8–23)
BUN: 48 mg/dL — ABNORMAL HIGH (ref 8–23)
CO2: 19 mmol/L — ABNORMAL LOW (ref 22–32)
CO2: 19 mmol/L — ABNORMAL LOW (ref 22–32)
Calcium: 7.3 mg/dL — ABNORMAL LOW (ref 8.9–10.3)
Calcium: 7.4 mg/dL — ABNORMAL LOW (ref 8.9–10.3)
Chloride: 101 mmol/L (ref 98–111)
Chloride: 100 mmol/L (ref 98–111)
Creatinine, Ser: 2.6 mg/dL — ABNORMAL HIGH (ref 0.61–1.24)
Creatinine, Ser: 2.11 mg/dL — ABNORMAL HIGH (ref 0.61–1.24)
GFR, Estimated: 26 mL/min — ABNORMAL LOW (ref >60)
GFR, Estimated: 33 mL/min — ABNORMAL LOW (ref >60)
Glucose, Bld: 261 mg/dL — ABNORMAL HIGH (ref 70–99)
Glucose, Bld: 298 mg/dL — ABNORMAL HIGH (ref 70–99)
Phosphorus: 1.3 mg/dL — ABNORMAL LOW (ref 2.5–4.6)
Phosphorus: 1.4 mg/dL — ABNORMAL LOW (ref 2.5–4.6)
Potassium: 3.3 mmol/L — ABNORMAL LOW (ref 3.5–5.1)
Potassium: 3.4 mmol/L — ABNORMAL LOW (ref 3.5–5.1)
Sodium: 134 mmol/L — ABNORMAL LOW (ref 135–145)
Sodium: 133 mmol/L — ABNORMAL LOW (ref 135–145)

## 2021-01-05 LAB — POCT I-STAT 7, (LYTES, BLD GAS, ICA,H+H)
Acid-Base Excess: 0 mmol/L (ref 0.0–2.0)
Bicarbonate: 22.1 mmol/L (ref 20.0–28.0)
Calcium, Ion: 1.08 mmol/L — ABNORMAL LOW (ref 1.15–1.40)
HCT: 25 % — ABNORMAL LOW (ref 39.0–52.0)
Hemoglobin: 8.5 g/dL — ABNORMAL LOW (ref 13.0–17.0)
O2 Saturation: 96 %
Patient temperature: 98
Potassium: 3.5 mmol/L (ref 3.5–5.1)
Sodium: 136 mmol/L (ref 135–145)
TCO2: 23 mmol/L (ref 22–32)
pCO2 arterial: 25.5 mmHg — ABNORMAL LOW (ref 32.0–48.0)
pH, Arterial: 7.546 — ABNORMAL HIGH (ref 7.350–7.450)
pO2, Arterial: 68 mmHg — ABNORMAL LOW (ref 83.0–108.0)

## 2021-01-05 LAB — CBC
HCT: 25.7 % — ABNORMAL LOW (ref 39.0–52.0)
Hemoglobin: 8.2 g/dL — ABNORMAL LOW (ref 13.0–17.0)
MCH: 29.6 pg (ref 26.0–34.0)
MCHC: 31.9 g/dL (ref 30.0–36.0)
MCV: 92.8 fL (ref 80.0–100.0)
Platelets: 21 10*3/uL — CL (ref 150–400)
RBC: 2.77 MIL/uL — ABNORMAL LOW (ref 4.22–5.81)
RDW: 20.7 % — ABNORMAL HIGH (ref 11.5–15.5)
WBC: 11.2 10*3/uL — ABNORMAL HIGH (ref 4.0–10.5)
nRBC: 0.4 % — ABNORMAL HIGH (ref 0.0–0.2)

## 2021-01-05 LAB — BPAM RBC
Blood Product Expiration Date: 202202092359
Blood Product Expiration Date: 202202192359
ISSUE DATE / TIME: 202202030838
ISSUE DATE / TIME: 202202040432
Unit Type and Rh: 6200
Unit Type and Rh: 6200

## 2021-01-05 LAB — GLUCOSE, CAPILLARY
Glucose-Capillary: 246 mg/dL — ABNORMAL HIGH (ref 70–99)
Glucose-Capillary: 222 mg/dL — ABNORMAL HIGH (ref 70–99)
Glucose-Capillary: 255 mg/dL — ABNORMAL HIGH (ref 70–99)
Glucose-Capillary: 307 mg/dL — ABNORMAL HIGH (ref 70–99)
Glucose-Capillary: 267 mg/dL — ABNORMAL HIGH (ref 70–99)
Glucose-Capillary: 281 mg/dL — ABNORMAL HIGH (ref 70–99)

## 2021-01-05 LAB — MAGNESIUM: Magnesium: 2 mg/dL (ref 1.7–2.4)

## 2021-01-05 LAB — PROTIME-INR
INR: 1.2 (ref 0.8–1.2)
Prothrombin Time: 14.8 seconds (ref 11.4–15.2)

## 2021-01-05 MED ORDER — MIDODRINE HCL 5 MG PO TABS
10.0000 mg | ORAL_TABLET | Freq: Three times a day (TID) | ORAL | Status: DC
  Administered 2021-01-05 – 2021-01-08 (×12): 10 mg via ORAL
  Filled 2021-01-05 (×12): qty 2

## 2021-01-05 MED ORDER — TRACE MINERALS CU-MN-SE-ZN 300-55-60-3000 MCG/ML IV SOLN
INTRAVENOUS | Status: AC
  Filled 2021-01-05: qty 934.4

## 2021-01-05 MED ORDER — POTASSIUM PHOSPHATES 15 MMOLE/5ML IV SOLN
30.0000 mmol | Freq: Once | INTRAVENOUS | Status: AC
  Administered 2021-01-05: 30 mmol via INTRAVENOUS
  Filled 2021-01-05: qty 10

## 2021-01-05 MED ORDER — DEXMEDETOMIDINE HCL IN NACL 400 MCG/100ML IV SOLN
0.4000 ug/kg/h | INTRAVENOUS | Status: DC
  Administered 2021-01-05: 0.4 ug/kg/h via INTRAVENOUS
  Administered 2021-01-06: 0.2 ug/kg/h via INTRAVENOUS
  Filled 2021-01-05 (×2): qty 100

## 2021-01-05 MED ORDER — CHLORHEXIDINE GLUCONATE 0.12 % MT SOLN
OROMUCOSAL | Status: AC
  Administered 2021-01-05: 15 mL via OROMUCOSAL
  Filled 2021-01-05: qty 15

## 2021-01-05 MED ORDER — FENTANYL CITRATE (PF) 100 MCG/2ML IJ SOLN
50.0000 ug | Freq: Every day | INTRAMUSCULAR | Status: DC | PRN
  Administered 2021-01-05 – 2021-01-08 (×4): 50 ug via INTRAVENOUS
  Filled 2021-01-05 (×4): qty 2

## 2021-01-05 NOTE — Progress Notes (Signed)
Physical Therapy Wound Treatment Patient Details  Name: [name] MRN: [id number] Date of Birth: 1950-12-29  Today's Date: 01/05/2021 Time: 1012-1055 Time Calculation (min): 43 min  Subjective  Subjective: OK. (pt now extubated) Patient and Family Stated Goals: heal wound Date of Onset:  (unknown - pt reports at least a month) Prior Treatments: unknown  Pain Score: Pain Score: 0-No pain  Wound Assessment  Pressure Injury 12/05/20 Coccyx Medial;Upper Unstageable - Full thickness tissue loss in which the base of the injury is covered by slough (yellow, tan, gray, green or brown) and/or eschar (tan, brown or black) in the wound bed. prevous stage 2 on sacrum (Active)  Wound Image   01/02/21 1220  Dressing Type Barrier Film (skin prep);Gauze (Comment);ABD;Moist to moist;Santyl 01/05/21 0930  Dressing Changed;Clean;Dry;Intact 01/05/21 0930  Dressing Change Frequency Twice a day 01/05/21 0930  State of Healing Early/partial granulation 01/05/21 0930  Site / Wound Assessment Bleeding;Brown;Red;Yellow 01/05/21 0930  % Wound base Red or Granulating 55% 01/05/21 0930  % Wound base Yellow/Fibrinous Exudate 30% 01/05/21 0930  % Wound base Black/Eschar 15% 01/05/21 0930  % Wound base Other/Granulation Tissue (Comment) 0% 01/05/21 0930  Peri-wound Assessment Bleeding;Erythema (non-blanchable) 01/03/21 2000  Wound Length (cm) 10 cm 01/02/21 1220  Wound Width (cm) 8 cm 01/02/21 1220  Wound Depth (cm) 1 cm 01/02/21 1220  Wound Surface Area (cm^2) 80 cm^2 01/02/21 1220  Wound Volume (cm^3) 80 cm^3 01/02/21 1220  Tunneling (cm) 5 cm at 8 o clock 12/26/20 1253  Undermining (cm) 4+ cm at 7-12 oclock, 2 cm at 12 to 6 oclock 01/04/21 1355  Margins Unattached edges (unapproximated) 01/05/21 0930  Drainage Amount Moderate 01/05/21 0930  Drainage Description Purulent 01/05/21 0930  Treatment Cleansed;Debridement (Selective);Hydrotherapy (Pulse lavage) 01/05/21 0930      Hydrotherapy Pulsed lavage  therapy - wound location: sacrum Pulsed Lavage with Suction (psi): 12 psi (4-12) Pulsed Lavage with Suction - Normal Saline Used: 1000 mL Pulsed Lavage Tip: Tip with splash shield Selective Debridement Selective Debridement - Location: sacrum Selective Debridement - Tools Used: Scalpel;Forceps Selective Debridement - Tissue Removed: eschar, brown slough, black necrotic edges   Wound Assessment and Plan  Wound Therapy - Assess/Plan/Recommendations Wound Therapy - Clinical Statement: Purulent draninage noted from area of undermining @ 7 and 11 occlock, skin surrounding wound continues to deteriorate. Will continue hydro and selective debridement to both clean up necrosis and decrease the bioburden. Wound Therapy - Functional Problem List: global weakness and immobility Factors Delaying/Impairing Wound Healing: Immobility;Multiple medical problems Hydrotherapy Plan: Debridement;Dressing change;Patient/family education;Pulsatile lavage with suction Wound Therapy - Frequency: 6X / week Wound Therapy - Follow Up Recommendations: Skilled nursing facility Wound Plan: [name] d/c'd, but discharged held to continue debridement and management of notable infection.  Wound Therapy Goals- Improve the function of patient's integumentary system by progressing the wound(s) through the phases of wound healing (inflammation - proliferation - remodeling) by: Decrease Necrotic Tissue to: 40 Decrease Necrotic Tissue - Progress: Progressing toward goal Increase Granulation Tissue to: 60 Increase Granulation Tissue - Progress: Progressing toward goal Goals/treatment plan/discharge plan were made with and agreed upon by patient/family: Yes Time For Goal Achievement: 7 days Wound Therapy - Potential for Goals: Good  Goals will be updated until maximal potential achieved or discharge criteria met.  Discharge criteria: when goals achieved, discharge from hospital, MD decision/surgical intervention, no progress  towards goals, refusal/missing three consecutive treatments without notification or medical reason.  GP     Arby Barrette, PT Pager [phone]  Jeanie Cooks Arah Aro 01/05/2021, 1:19 PM

## 2021-01-05 NOTE — Progress Notes (Signed)
Patient ID: [name], male   DOB: 01/17/1951, 69 y.o.   MRN: [id number] S: Tolerating CRRT well with almost 3 liter UF overnight. O:BP (!) 158/101   Pulse 86   Temp (!) 97.2 F (36.2 C) (Axillary)   Resp 16   Ht 6\' 2"  (1.88 m)   Wt 68 kg   SpO2 98%   BMI 19.25 kg/m   Intake/Output Summary (Last 24 hours) at 01/05/2021 0753 Last data filed at 01/05/2021 0700 Gross per 24 hour  Intake 2405.99 ml  Output 5239 ml  Net -2833.01 ml   Intake/Output: I/O last 3 completed shifts: In: 1093 [I.V.:2789.4; NG/GT:301.7; IV Piggyback:300] Out: 2355 [Drains:580; DDUKG:2542]  Intake/Output this shift:  No intake/output data recorded. Weight change: -3.4 kg Gen: frail, WM in NAD CVS: rrr Resp: cta Abd: +BS, soft, NT, colostomy in LLQ, wound dressing on right midline Ext: 1+ edema  Recent Labs  Lab 12/30/20 0327 12/05/2020 0325 12/07/2020 1335 01/01/21 0254 01/02/21 0305 01/03/21 0301 01/03/21 1641 01/04/21 0304 01/04/21 1604 01/05/21 0516  NA 134* 134*   < > 133* 131* 132* 133* 133* 136 134*  K 4.3 4.3   < > 4.7 5.2* 4.0 3.9 3.8 3.7 3.3*  CL 101 100   < > 100 98 99 99 100 102 101  CO2 19* 16*   < > 14* 14* 17* 17* 18* 18* 19*  GLUCOSE 122* 122*   < > 131* 216* 237* 269* 351* 196* 261*  BUN 66* 79*   < > 88* 104* 81* 69* 77* 68* 56*  CREATININE 5.42* 6.18*   < > 6.06* 6.45* 4.92* 3.88* 4.25* 3.38* 2.60*  ALBUMIN 1.7* 1.6*  --  1.7* 1.7* 1.6* 1.8* 1.5* 1.7* 1.6*  CALCIUM 7.6* 7.6*   < > 7.3* 7.5* 7.1* 7.3* 7.1* 7.3* 7.3*  PHOS  --   --   --  6.1* 7.2* 5.2* 4.0 3.6 2.2* 1.3*  AST 239* 200*  --  103* 58* 33  --  40  --   --   ALT 175* 161*  --  108* 86* 61*  --  50*  --   --    < > = values in this interval not displayed.   Liver Function Tests: Recent Labs  Lab 01/02/21 0305 01/03/21 0301 01/03/21 1641 01/04/21 0304 01/04/21 1604 01/05/21 0516  AST 58* 33  --  40  --   --   ALT 86* 61*  --  50*  --   --   ALKPHOS 120 99  --  98  --   --   BILITOT 1.2 0.9  --  2.2*  --   --    PROT 4.6* 4.3*  --  4.2*  --   --   ALBUMIN 1.7* 1.6*   < > 1.5* 1.7* 1.6*   < > = values in this interval not displayed.   No results for input(s): LIPASE, AMYLASE in the last 168 hours. No results for input(s): AMMONIA in the last 168 hours. CBC: Recent Labs  Lab 01/01/21 0254 01/01/21 1730 01/02/21 0305 01/03/21 0439 01/03/21 1308 01/04/21 0304 01/04/21 0834  WBC 10.3  --  10.9* 11.6* 11.9* 12.2*  --   NEUTROABS 9.5*  --  9.9* 10.6*  --  11.0*  --   HGB 7.8*  --  7.7* 6.3* 8.2* 6.8* 7.8*  HCT 23.6*  --  24.0* 19.1* 24.5* 21.3* 24.4*  MCV 92.2  --  94.9 95.5 93.2 95.5  --  PLT 44*   < > 49* 36* 38* 32*  --    < > = values in this interval not displayed.   Cardiac Enzymes: No results for input(s): CKTOTAL, CKMB, CKMBINDEX, TROPONINI in the last 168 hours. CBG: Recent Labs  Lab 01/04/21 1424 01/04/21 1650 01/04/21 2025 01/04/21 2355 01/05/21 0344  GLUCAP 190* 170* 197* 183* 246*    Iron Studies: No results for input(s): IRON, TIBC, TRANSFERRIN, FERRITIN in the last 72 hours. Studies/Results: No results found. Marland Kitchen alteplase  2 mg Intracatheter Once  . vitamin C  500 mg Oral Daily  . chlorhexidine  15 mL Mouth Rinse BID  . Chlorhexidine Gluconate Cloth  6 each Topical Q0600  . collagenase   Topical Daily  . feeding supplement (VITAL 1.5 CAL)  1,000 mL Per Tube Q24H  . hydrocortisone sod succinate (SOLU-CORTEF) inj  50 mg Intravenous Q12H  . insulin aspart  0-20 Units Subcutaneous Q4H  . mouth rinse  15 mL Mouth Rinse q12n4p  . midodrine  10 mg Oral TID WC  . mupirocin ointment   Nasal BID  . pantoprazole (PROTONIX) IV  40 mg Intravenous Q24H  . sodium chloride flush  10-40 mL Intracatheter Q12H  . zinc sulfate  220 mg Oral Daily    BMET    Component Value Date/Time   NA 134 (L) 01/05/2021 0516   K 3.3 (L) 01/05/2021 0516   CL 101 01/05/2021 0516   CO2 19 (L) 01/05/2021 0516   GLUCOSE 261 (H) 01/05/2021 0516   BUN 56 (H) 01/05/2021 0516   CREATININE 2.60  (H) 01/05/2021 0516   CALCIUM 7.3 (L) 01/05/2021 0516   GFRNONAA 26 (L) 01/05/2021 0516   GFRAA 19 (L) 06/15/2018 0407   CBC    Component Value Date/Time   WBC 12.2 (H) 01/04/2021 0304   RBC 2.23 (L) 01/04/2021 0304   HGB 7.8 (L) 01/04/2021 0834   HCT 24.4 (L) 01/04/2021 0834   PLT 32 (L) 01/04/2021 0304   MCV 95.5 01/04/2021 0304   MCH 30.5 01/04/2021 0304   MCHC 31.9 01/04/2021 0304   RDW 19.2 (H) 01/04/2021 0304   LYMPHSABS 0.4 (L) 01/04/2021 0304   MONOABS 0.7 01/04/2021 0304   EOSABS 0.0 01/04/2021 0304   BASOSABS 0.0 01/04/2021 0304    OP HD: TTS [facility] stretcher dialysis 66.5kg dry wt per OP HD RN/ R IJ TDC   Assessment/Plan:  1. Ischemic bowel with Pneumoperitoneum/ shock -sp exlap, no perforated viscous found.repeat Abd films with worsening pneumoperitoneum ands/p left colectomy/colostomy 12/26/2020. 2. ESRD - on HD TTS.Not able to UF with IHD 1. Initiated CRRT 01/03/21 but did not get much due to catheter malfunction.  Had good UF over the last 24 hours 2. Will plan to continue CRRT for another 24 hours with UF and then attempt IHD on Monday. 3. Agree with restarting home dose of midodrine 3. Covid positive - off isolation 4. Orthostatic hypotension - chronic issue, pt is bed-bound it appears and does stretcher dialysis w/ the group in [facility]. On midodrine and fluorinef outpatient per charting.  5. BP /volume - has sig LE/ UE edema, up 1-2kg by wt's, no resp issues. Unable to UF large amts due to low alb/ 3rd spacing of fluid.  6. DM2 - per primary team  7. Anemia ckd - no emergent need for PRBC's. need records re: ESA dosing.  8. Hypocalcemia - repleted; added ca bath 9. Hypophosphatemia - will give IV phos and follow.  10. Prognosis - very poorand has been seen by Palliative care in the pastand no evidence of improvement.He has severe autonomic dysfunction and is predominantly bed bound and can only get dialysis on a stretcher. Now with  perforated colon s/p colectomy/colostomy, ongoing hypotension and hypoalbuminemia with poor chance of meaningful recovery or healing.Will reconsult for possible transition to comfort care.   Donetta Potts, MD [name] [phone]

## 2021-01-05 NOTE — Progress Notes (Addendum)
eLink Physician-Brief Progress Note Patient Name: [name] DOB: [birth year] MRN: [id number]   Date of Service  01/05/2021  HPI/Events of Note  Notified of confusion. Seen on camera. RN notes patient became confused and disoriented. Glucose was 260. No focal deficits. On camera, he is calm but disoriented, thinks he is in [place]. On low dose levohed and CRRT. Partial code status noted.   eICU Interventions  Given no focal signs and sudden change, suspect delirium. Patient has been constantly saying he has been unable to sleep or get any rest for days. Will start with ABG. We will decide on precedex / scanning based on his condition.      Intervention Category Major Interventions: Delirium, psychosis, severe agitation - evaluation and management  Sunil G Kamat 01/05/2021, 8:20 PM   8:45 pm -  ABG noted Very agitated and restless Again nothing focal on exam, just not able to rest is what he says Last Qtc was around 500, repeat EKG HR in 90s, try precedex  11:10 pm EKG noted, do not think this is STEMI, also reviewed by bedside CCM Is on precedex but still confused, though not agitated anymore Will proceed with CT head  1:40 am CT head resulted No signs of acute infarction clinically noted In fact patient is now calm, answering questions appropriately, and as before, was able to move all 4 extremties even now  Platelets in 20s, and unclear time frame anyway since no prior CT head noted here Has history of AF, was not on AC due to GI bleed and since mental status reverted to normal and with no focal signs, will continue to monitor May need neuro eval if things change although options limited given the bleeding and platelet count  D/w RN

## 2021-01-05 NOTE — Progress Notes (Addendum)
NAME:  [name], MRN:  [id number], DOB:  11-17-1951, LOS: 74 ADMISSION DATE:  12/30/2020, CONSULTATION DATE: 12/03/2020 REFERRING MD:  Jesusita Oka, MD, CHIEF COMPLAINT: Acute hypoxic respiratory failure post laparotomy  Brief History:  70 year old male with end-stage renal disease on dialysis TTS, admitted with nausea, vomiting, and abdominal pain, noted to have pneumoperitoneum.  Underwent laparotomy 1/20 which was negative.  Incidentally found to be COVID-positive. On 1/30, his LFTs started to elevate and KUB showed worsening intraperitoneal air.  He was taken back to OR 1/31 for ex lap with end colostomy and left colectomy.  Returns to ICU on two vasopressors and remains intubated secondary to hemodynamic instability.   History of Present Illness:  70 year old male with diabetes, ESRD on HD, TTS, chronic diarrhea, chronic hypotension, PAF - systemic AC on hold w/hx of GIB, admitted 1/19 from SNF with N/V and abd pain found to have pneumoperitoneum.   Prior to this he had prolonged hospitalization 12/3- 1/10  at [facility] with diarrhea, dehydration/ hypotension, deconditioning being bed bound with sacral decub, then previously 11/12- 12/2 with syncope and colitis.   Underwent laparotomy 1/20 without findings of bowel perforation.  Incidentally COVID positive.  He was transferred to [facility] on 1/22.  He was doing better however his LFTs elevated on 1/30 and KUB 1/31 showed worsening intraperitoneal air.  Of note he has previously had multiple ulcers in the sigmoid colon of nonspecific etiology.  He was taken back to the OR today, 1/31 and underwent exploratory laparotomy with end colostomy and left colectomy- noting that entire left colon was boggy thought secondary to ischemia; also noted to have ulcerated area on anterior rectum.  Post operative, he returns to ICU on two vasopressors and remains intubated due to hemodynamic instability. EBL 50 ml.  PCCM re-consulted for ICU management.   Past Medical  History:  End-stage renal disease on dialysis Diabetes type 2 Paroxysmal A. fib Orthostatic hypotension Unstageable sacral decubitus ulcer (POA)  Significant Hospital Events:  1/19>> Admit to [facility] for abdominal pain-pneumoperitoneum on imaging studies- 1/20 exploratory laparotomy negative/ extubated  1/21 iHD, low dose phenyl needed, weaned off after iHD stopped, started on stress dose steroids - got 1 dose and was stopped, resumed home midodrine 1/22>> transferred to [facility] service. 1/26>> plans to discharge back to SNF-however upon further evaluation-needed inpatient hydrotherapy for worsening sacral wound. 1/27>> general surgery consulted for sacral wound-continue hydrotherapy for few more days. 1/31 Back to the OR for Pneumoperitoneum ( Exp. Lap Left colectomy, end colectomy), to ICU for shock post op, remains on vent  Consults:  [facility] Nephrology   Procedures:  1/10 right subclavian DL TDC >>  1/20 ex lap >> neg 1/31 ex lap >> end colostomy/ left colectomy   ETT 1/20; 1/31 >> Left radial aline 1/31 >>  Significant Diagnostic Tests:  1/20 CT abdomen and pelvis: There is a focal area of wall thickening with adjacent perforation of the distal descending colon at the junction of the sigmoid colon, likely the transition point. 2. There is moderately dilated sigmoid colon and rectum which is stool-filled and suggestion of possible pneumatosis. 3. Free air seen under the right hemidiaphragm and within the porta hepatis. 4. Small amount of air seen within the inferior right liver lobe which is concerning for portal venous gas given the patient's findings 5. Bilateral sacral decubitus ulceration with phlegmon and subcutaneous emphysema extending to the inferior coccyx.  1/31 KUB >> Massive pneumoperitoneum again noted. Note that this typically more than  would be expected given laparotomy on 12/11/2020 and an ongoing air leak secondary to visceral perforation should be considered.   Repeat CT imaging with oral contrast may be helpful to identify the source of leak.  Suspected long segment colitis involving the distal descending and sigmoid colon.  2/1 KUB>> stable massive pneumoperitoneum; gastric tube in stomach   Micro Data:  1/20 influenza PCR negative 1/20 COVID PCR positive 1/21 MRSA PCR >> positive  1/31 BCx2 >> NGTD  Antimicrobials:  Zosyn 1/19 > 1/24; 1/31 >> 2/4  Interim History / Subjective:   Extubated yesterday, breathing ok. Belly pain ok. Tolerating CRRT on low dose NE.  Objective   Blood pressure (!) 158/101, pulse 93, temperature (!) 97.2 F (36.2 C), temperature source Axillary, resp. rate 18, height 6\' 2"  (1.88 m), weight 68 kg, SpO2 94 %.    Vent Mode: PSV;CPAP FiO2 (%):  [30 %] 30 % PEEP:  [5 cmH20] 5 cmH20 Pressure Support:  [5 cmH20] 5 cmH20   Intake/Output Summary (Last 24 hours) at 01/05/2021 0924 Last data filed at 01/05/2021 0900 Gross per 24 hour  Intake 2490.87 ml  Output 5476 ml  Net -2985.13 ml   Filed Weights   01/03/21 0314 01/04/21 0500 01/05/21 0500  Weight: 70 kg 71.4 kg 68 kg   Physical Exam: General: Chronically ill-appearing, cachectic, thin malnourished, no acute distress HENT: [place], AT, on [place] Eyes: EOMI, no scleral icterus Respiratory: Clear to auscultation bilaterally.  No crackles, wheezing or rales Cardiovascular: RRR, -M/R/G, no JVD GI: BS+, open abdomen, left lower quadrant ostomy, JP drain in place Extremities: 2+ pitting edema in upper and lower extremities Neuro: Awake and alert, follows commands Skin: Sacral wound not visualized   Resolved Hospital Problem list   Large bowel obstruction status post exploratory laparotomy Acute metabolic acidosis COVID 19 infection- off isolation   Assessment & Plan:  Pneumoperitoneum s/p ex-lap resulting in left colectomy with end colostomy  Ischemic bowel - s/p end colostomy and left colectomy 1/31, evidence of ischemic left colon - Management per [facility] - no role  for additional surgery -TPN per pharmacy started 2/1  - trickle TF 2/4, attempt advance 2/6  Hypotension, Septic shock, hx orthostatic: multifactorial- poor nutrition, chronic hypotension, AI, sedation, rule out intra-abdominal infection given microperforation/ pneumoperitoneum, sacral ulcer - wean stress dose steroids to end 2/6 - trend CBC - follow blood cultures, ngtd - Zosyn per pharmacy for 5 days(ended 2/4) - Home midodrine 10 mg TID  Acute hypoxic respiratory insufficiency post-operatively, previously due to volume overload from missing HD session Open abdomen -Extubated 2/4 - volume removal per CRRT  ESRD on TTS (new to HD since 10/2020) - CRRT per nephrology - trend renal indices  - continue foley for now, if ongoing low UOP, consider d/c foley > bladder scan, however UOP trend 1L-> 25 ml  Diabetes type 2: - SSI sensitive   PAF w/RVR - remains in NSR - PO amiodarone held - hold betablocker - no systemic AC given prior GIB  Sacral wound- un-stageable, present on admission - per WOC/ surgery recommendations, hydrotherapy as tolerated  - no role for OR per surgery  Anemia- multifactorial, of chronic illness, ABLA - transfused 1 unit 1/29 , 1/30, 2/3 - remains on PPI BID - trend CBC - transfuse for Hgb < 7  Thrombocytopenia - suspect sepsis -  Platelets trend stable, continue to monitor/ trend  -  Transfuse for goal plt >10k  Chronic debility/ deconditioning -  mostly bed bound now,  PT/ OT  Protein calorie malnutrition>> severe - TPN per pharmacy  - TF trickle 2/4  Best practice (evaluated daily)  Diet: NPO, TPN, TF Pain/Anxiety/Delirium protocol (if indicated): n/a VAP protocol (if indicated): n/a DVT prophylaxis: SCDs only  GI prophylaxis: PPI  Glucose control: SSI sensitive Mobility: PT/OT Disposition: ICU  Goals of Care:   Code Status: Limitations Poor prognosis, Goals of care discussion with patient and palliative care 2/4 limits to no CPR,  otherwise would want everything else.  Labs   CBC: Recent Labs  Lab 12/02/2020 1724 12/23/2020 1751 01/01/21 0254 01/01/21 1730 01/02/21 0305 01/03/21 0439 01/03/21 1308 01/04/21 0304 01/04/21 0834  WBC 8.4  --  10.3  --  10.9* 11.6* 11.9* 12.2*  --   NEUTROABS 7.7  --  9.5*  --  9.9* 10.6*  --  11.0*  --   HGB 8.5*   < > 7.8*  --  7.7* 6.3* 8.2* 6.8* 7.8*  HCT 25.9*   < > 23.6*  --  24.0* 19.1* 24.5* 21.3* 24.4*  MCV 92.5  --  92.2  --  94.9 95.5 93.2 95.5  --   PLT 55*  51*  --  44* 50* 49* 36* 38* 32*  --    < > = values in this interval not displayed.    Basic Metabolic Panel: Recent Labs  Lab 01/01/21 0254 01/02/21 0305 01/03/21 0301 01/03/21 1641 01/04/21 0304 01/04/21 1604 01/05/21 0516  NA 133* 131* 132* 133* 133* 136 134*  K 4.7 5.2* 4.0 3.9 3.8 3.7 3.3*  CL 100 98 99 99 100 102 101  CO2 14* 14* 17* 17* 18* 18* 19*  GLUCOSE 131* 216* 237* 269* 351* 196* 261*  BUN 88* 104* 81* 69* 77* 68* 56*  CREATININE 6.06* 6.45* 4.92* 3.88* 4.25* 3.38* 2.60*  CALCIUM 7.3* 7.5* 7.1* 7.3* 7.1* 7.3* 7.3*  MG 1.9 1.9 1.8  --  1.8  --  2.0  PHOS 6.1* 7.2* 5.2* 4.0 3.6 2.2* 1.3*   GFR: Estimated Creatinine Clearance: 25.4 mL/min (A) (by C-G formula based on SCr of 2.6 mg/dL (H)). Recent Labs  Lab 12/16/2020 1724 01/01/21 0254 01/02/21 0305 01/03/21 0439 01/03/21 1308 01/04/21 0304  WBC 8.4   < > 10.9* 11.6* 11.9* 12.2*  LATICACIDVEN 1.5  --   --   --   --   --    < > = values in this interval not displayed.    Liver Function Tests: Recent Labs  Lab 12/25/2020 0325 01/01/21 0254 01/02/21 0305 01/03/21 0301 01/03/21 1641 01/04/21 0304 01/04/21 1604 01/05/21 0516  AST 200* 103* 58* 33  --  40  --   --   ALT 161* 108* 86* 61*  --  50*  --   --   ALKPHOS 167* 107 120 99  --  98  --   --   BILITOT 0.8 1.3* 1.2 0.9  --  2.2*  --   --   PROT 4.7* 4.5* 4.6* 4.3*  --  4.2*  --   --   ALBUMIN 1.6* 1.7* 1.7* 1.6* 1.8* 1.5* 1.7* 1.6*   No results for input(s): LIPASE,  AMYLASE in the last 168 hours. No results for input(s): AMMONIA in the last 168 hours.  ABG    Component Value Date/Time   PHART 7.420 12/19/2020 1751   PCO2ART 28.5 (L) 12/12/2020 1751   PO2ART 476 (H) 12/26/2020 1751   HCO3 18.8 (L) 12/07/2020 1751   TCO2 20 (L) 12/10/2020 1751  ACIDBASEDEF 5.0 (H) 12/28/2020 1751   O2SAT 100.0 12/23/2020 1751     Coagulation Profile: Recent Labs  Lab 01/01/21 1730 01/02/21 0305 01/03/21 0301 01/04/21 0304 01/05/21 0516  INR 1.1 1.1 1.2 1.3* 1.2    Cardiac Enzymes: No results for input(s): CKTOTAL, CKMB, CKMBINDEX, TROPONINI in the last 168 hours.  HbA1C: Hgb A1c MFr Bld  Date/Time Value Ref Range Status  12/21/2020 12:08 PM 4.1 (L) 4.8 - 5.6 % Final    Comment:    (NOTE) Pre diabetes:          5.7%-6.4%  Diabetes:              >6.4%  Glycemic control for   <7.0% adults with diabetes   06/13/2018 03:16 AM 6.6 (H) 4.8 - 5.6 % Final    Comment:    (NOTE) Pre diabetes:          5.7%-6.4% Diabetes:              >6.4% Glycemic control for   <7.0% adults with diabetes     CBG: Recent Labs  Lab 01/04/21 1650 01/04/21 2025 01/04/21 2355 01/05/21 0344 01/05/21 0817  GLUCAP 170* 197* 183* 246* 222*    CRITICAL CARE Performed by: Bonna Gains Keriana Sarsfield   Total critical care time: 31 minutes  Critical care time was exclusive of separately billable procedures and treating other patients.  Critical care was necessary to treat or prevent imminent or life-threatening deterioration.  Critical care was time spent personally by me on the following activities: development of treatment plan with patient and/or surrogate as well as nursing, discussions with consultants, evaluation of patient's response to treatment, examination of patient, obtaining history from patient or surrogate, ordering and performing treatments and interventions, ordering and review of laboratory studies, ordering and review of radiographic studies, pulse  oximetry and re-evaluation of patient's condition.    Lanier Clam, MD

## 2021-01-05 NOTE — Progress Notes (Signed)
5 Days Post-Op   Subjective/Chief Complaint: No complaints   Objective: Vital signs in last 24 hours: Temp:  [97.2 F (36.2 C)-97.4 F (36.3 C)] 97.2 F (36.2 C) (02/04 1600) Pulse Rate:  [72-115] 86 (02/05 0600) Resp:  [15-23] 16 (02/05 0600) BP: (126-158)/(79-101) 158/101 (02/04 1900) SpO2:  [84 %-100 %] 98 % (02/05 0600) Arterial Line BP: (98-183)/(48-81) 139/63 (02/05 0600) FiO2 (%):  [30 %] 30 % (02/04 1050) Weight:  [68 kg] 68 kg (02/05 0500) Last BM Date: 01/03/21  Intake/Output from previous day: 02/04 0701 - 02/05 0700 In: 2406 [I.V.:1904.4; NG/GT:301.7; IV Piggyback:199.9] Out: 5239 [Drains:440] Intake/Output this shift: Total I/O In: 110.9 [I.V.:90.9; NG/GT:20] Out: 287 [Other:287]  General appearance: alert and cooperative Resp: clear to auscultation bilaterally Cardio: regular rate and rhythm GI: soft, nontender. ostomy pink with air in bag but no stool  Lab Results:  Recent Labs    01/03/21 1308 01/04/21 0304 01/04/21 0834  WBC 11.9* 12.2*  --   HGB 8.2* 6.8* 7.8*  HCT 24.5* 21.3* 24.4*  PLT 38* 32*  --    BMET Recent Labs    01/04/21 1604 01/05/21 0516  NA 136 134*  K 3.7 3.3*  CL 102 101  CO2 18* 19*  GLUCOSE 196* 261*  BUN 68* 56*  CREATININE 3.38* 2.60*  CALCIUM 7.3* 7.3*   PT/INR Recent Labs    01/04/21 0304 01/05/21 0516  LABPROT 15.9* 14.8  INR 1.3* 1.2   ABG No results for input(s): PHART, HCO3 in the last 72 hours.  Invalid input(s): PCO2, PO2  Studies/Results: No results found.  Anti-infectives: Anti-infectives (From admission, onward)   Start     Dose/Rate Route Frequency Ordered Stop   01/03/21 1300  piperacillin-tazobactam (ZOSYN) IVPB 3.375 g  Status:  Discontinued        3.375 g 12.5 mL/hr over 240 Minutes Intravenous Every 6 hours 01/03/21 1204 01/03/21 1204   01/03/21 1300  piperacillin-tazobactam (ZOSYN) IVPB 3.375 g        3.375 g 100 mL/hr over 30 Minutes Intravenous Every 6 hours 01/03/21 1205  01/05/21 0642   12/11/2020 1600  piperacillin-tazobactam (ZOSYN) IVPB 2.25 g  Status:  Discontinued        2.25 g 100 mL/hr over 30 Minutes Intravenous Every 8 hours 12/17/2020 1507 01/03/21 1204   12/14/2020 1315  piperacillin-tazobactam (ZOSYN) IVPB 3.375 g  Status:  Discontinued        3.375 g 12.5 mL/hr over 240 Minutes Intravenous To Surgery 12/30/2020 1301 01/01/21 0846   12/26/2020 1500  piperacillin-tazobactam (ZOSYN) IVPB 3.375 g        3.375 g 12.5 mL/hr over 240 Minutes Intravenous Every 12 hours 12/23/2020 1219 12/24/20 2001   12/19/2020 1200  piperacillin-tazobactam (ZOSYN) IVPB 2.25 g  Status:  Discontinued        2.25 g 100 mL/hr over 30 Minutes Intravenous Every 8 hours 12/05/2020 0205 12/15/2020 1219   12/28/2020 0215  piperacillin-tazobactam (ZOSYN) IVPB 3.375 g        3.375 g 100 mL/hr over 30 Minutes Intravenous  Once 12/16/2020 0205 12/13/2020 0435      Assessment/Plan: s/p Procedure(s): EXPLORATORY LAPAROTOMY (N/A) LEFT COLECTOMY (N/A) TRANSVERSE COLOSTOMY (N/A) Continue tube feeds at low rate  End-stage renal disease Chronic diarrhea/colitis since 10/2020 Hx orthostatic hypotension Hx PAF-on anticoagulation, currently held Covid positive Bedbound Anemia/thrombocytopenia  - H/H 9.4/28.9>>7.8/23.6>>6.3/19.1  - platelets 65K>>44K>>36K Protein calorie malnutrition-severe(prealbumin<5)   Pneumoperitoneum, pneumatosis, portal venous gas s/p negativeexploratory laparotomy1/20 Dr. Jolaine Artist - intraop: no  acute findings, no contamination, excellent blood flow to intestine,congested rectosigmoid colon -Worsening pneumoperitoneum Exploratory laparotomy, left colectomy, end colostomy, 12/27/2020 Dr. Rolm Bookbinder POD #5 - post-op ileus, await ROBF, continue TPN  Chronic stage IV sacral decubitus pressure ulcer(12/10/20, first picture [facility]) - wound evaluated 2/3, peri wound with some necrosis, base overall clean, draining purulent material. He is not ill/septic from  this. May benefit from debridement in the OR at some point if the patient shows overall clinical improvement but would not recommend right now.  ID-zosyn 1/20-1/25, restart1/31>> day 6 (plan to stop abx at [place], may FEN- npo/TPN VTE-SCD's, chemical VTE held due to anemia requiring transfusion and thrombocytopenia (plts 32)  Plan: Continue acute care per CCM.  Await return of bowel function. Noted plans for [place] meeting with daughter today - will follow.   LOS: 16 days    [name] 01/05/2021

## 2021-01-05 NOTE — Progress Notes (Signed)
PHARMACY - TOTAL PARENTERAL NUTRITION CONSULT NOTE   Indication: Massive bowel resection and severe malnutrition  Patient Measurements: Height: 6\' 2"  (188 cm) Weight: 68 kg (149 lb 14.6 oz) IBW/kg (Calculated) : 82.2 TPN AdjBW (KG): 69.2 Body mass index is 19.25 kg/m.  Assessment: 43 yom male with diabetes, ESRDon HD, TTS,chronic diarrhea, chronic hypotension, PAF - systemic AC on hold w/hx of GIB, admitted 1/19 from SNF with N/V and abd pain, found to have pneumoperitoneum. Underwent laparotomy 1/20 without findings of bowel perforation.Incidentally COVID positive. He was transferred to [facility] on 1/22 and was doing better; however, LFTs elevated on 1/30, and KUB 1/31 showed worsening intraperitoneal air. Of note, previously had multiple ulcers in sigmoid colon of nonspecific etiology.He was taken back to OR 1/31 and underwent exploratory laparotomy with end colostomy and left colectomy, noting entire left colon was boggy, thought secondary to ischemia; also noted to have ulcerated area on anterior rectum. Pharmacy consulted for TPN. Noted, patient bed-bound at baseline.  Patient extubated 2/4. May transition CRRT back to IHD on 2/7 per Nephrology.  Glucose / Insulin: CBGs 170-261. Utilized 34 units SSI in last 24hrs. Stress steroids weaned 2/4 to [facility] 50mg  q12h (plan d/c on 2/6) Electrolytes: Na 134, K 3.3 (none in TPN, using 4K CRRT fluids), CO2 up to 19, Phos down to 1.3 (none in TPN); others WNL Renal: ESRD on HD TTS PTA - now on CRRT since 2/3 LFTs / TGs: AST normalized, ALT trend down to 50, TBili up to 2.2, TG WNL Prealbumin / albumin: Prealbumin <5, albumin 1.6 Intake / Output; MIVF: Drain output 470 ml/24hrs. Net +0.4L. LBM 2/3 GI Imaging: 2/1 Abd Xray - Stable massive pneumoperitoneum Surgeries / Procedures: 2/4 Cortrak placed  Central access: LIJ triple lumen CVC placed 01/01/21 TPN start date: 01/01/21  Nutritional Goals: (per RD recommendation on 2/4) Kcal:1900-2200kcal;  Protein:140-170 g; Fluid:1000 mL plus UOP Goal TPN rate:  80 ml/hr with ILE 50g on MWF only - will provide an average of 2113 kcal and 140g protein per day  Current Nutrition:  NPO  TPN Vital 1.5 started 2/4 at 36ml/hr - continue same rate today per Surgery, tolerating well per discussion with RN  Plan:  Continue concentrated TPN at goal rate 48mL/hr at 1800 Given national shortage of ILE, will provide lipids on MWF only Electrolytes in TPN: add 48mmol/L of Phos; otherwise continue same today - 79mEq/L of Na, 77mEq/L of K, 15mEq/L of Ca, 4 mEq/L of Mg. Max acetate KPhos 41mmol x 1 already ordered today per Nephrology Add standard MVI and trace elements to TPN. Remove chromium with patient on RRT. Continue resistant SSI q4h and adjust as needed (will not add insulin to TPN bag with stress steroids weaned 2/4 with plan to d/c 2/6) Monitor TPN labs, Nephrology plans, and [place] discussions F/u Surgery plans to advance TF and ability to wean TPN   Arturo Morton, PharmD, BCPS Please check AMION for all [place] contact numbers Clinical Pharmacist 01/05/2021 8:16 AM

## 2021-01-05 NOTE — Progress Notes (Signed)
CRITICAL VALUE ALERT  Critical Value:  Platelets 21  Date & Time Notied:  01/05/21 10:28 AM   Provider Notified: Dr. Silas Flood  Orders Received/Actions taken: monitor

## 2021-01-06 ENCOUNTER — Inpatient Hospital Stay (HOSPITAL_COMMUNITY): Payer: Medicare Other

## 2021-01-06 DIAGNOSIS — J9601 Acute respiratory failure with hypoxia: Secondary | ICD-10-CM | POA: Diagnosis not present

## 2021-01-06 DIAGNOSIS — E43 Unspecified severe protein-calorie malnutrition: Secondary | ICD-10-CM | POA: Diagnosis not present

## 2021-01-06 DIAGNOSIS — K631 Perforation of intestine (nontraumatic): Secondary | ICD-10-CM | POA: Diagnosis not present

## 2021-01-06 LAB — RENAL FUNCTION PANEL
Albumin: 1.4 g/dL — ABNORMAL LOW (ref 3.5–5.0)
Anion gap: 11 (ref 5–15)
BUN: 58 mg/dL — ABNORMAL HIGH (ref 8–23)
CO2: 22 mmol/L (ref 22–32)
Calcium: 7.2 mg/dL — ABNORMAL LOW (ref 8.9–10.3)
Chloride: 103 mmol/L (ref 98–111)
Creatinine, Ser: 2.43 mg/dL — ABNORMAL HIGH (ref 0.61–1.24)
GFR, Estimated: 28 mL/min — ABNORMAL LOW (ref >60)
Glucose, Bld: 252 mg/dL — ABNORMAL HIGH (ref 70–99)
Phosphorus: 1.3 mg/dL — ABNORMAL LOW (ref 2.5–4.6)
Potassium: 3.3 mmol/L — ABNORMAL LOW (ref 3.5–5.1)
Sodium: 136 mmol/L (ref 135–145)

## 2021-01-06 LAB — CBC
HCT: 19.8 % — ABNORMAL LOW (ref 39.0–52.0)
Hemoglobin: 6.6 g/dL — CL (ref 13.0–17.0)
MCH: 31 pg (ref 26.0–34.0)
MCHC: 33.3 g/dL (ref 30.0–36.0)
MCV: 93 fL (ref 80.0–100.0)
Platelets: 43 10*3/uL — ABNORMAL LOW (ref 150–400)
RBC: 2.13 MIL/uL — ABNORMAL LOW (ref 4.22–5.81)
RDW: 20.6 % — ABNORMAL HIGH (ref 11.5–15.5)
WBC: 8.6 10*3/uL (ref 4.0–10.5)
nRBC: 0.5 % — ABNORMAL HIGH (ref 0.0–0.2)

## 2021-01-06 LAB — HEMOGLOBIN AND HEMATOCRIT, BLOOD
HCT: 23.7 % — ABNORMAL LOW (ref 39.0–52.0)
Hemoglobin: 7.6 g/dL — ABNORMAL LOW (ref 13.0–17.0)

## 2021-01-06 LAB — GLUCOSE, CAPILLARY
Glucose-Capillary: 177 mg/dL — ABNORMAL HIGH (ref 70–99)
Glucose-Capillary: 206 mg/dL — ABNORMAL HIGH (ref 70–99)
Glucose-Capillary: 230 mg/dL — ABNORMAL HIGH (ref 70–99)
Glucose-Capillary: 233 mg/dL — ABNORMAL HIGH (ref 70–99)
Glucose-Capillary: 267 mg/dL — ABNORMAL HIGH (ref 70–99)
Glucose-Capillary: 270 mg/dL — ABNORMAL HIGH (ref 70–99)

## 2021-01-06 LAB — CULTURE, BLOOD (ROUTINE X 2)
Culture: NO GROWTH
Culture: NO GROWTH
Special Requests: ADEQUATE

## 2021-01-06 LAB — MAGNESIUM: Magnesium: 2 mg/dL (ref 1.7–2.4)

## 2021-01-06 LAB — PREPARE RBC (CROSSMATCH)

## 2021-01-06 MED ORDER — INSULIN GLARGINE 100 UNIT/ML ~~LOC~~ SOLN
20.0000 [IU] | Freq: Every day | SUBCUTANEOUS | Status: DC
  Administered 2021-01-06 – 2021-01-08 (×3): 20 [IU] via SUBCUTANEOUS
  Filled 2021-01-06 (×4): qty 0.2

## 2021-01-06 MED ORDER — ALTEPLASE 2 MG IJ SOLR
2.0000 mg | Freq: Once | INTRAMUSCULAR | Status: AC
  Administered 2021-01-06: 2 mg
  Filled 2021-01-06: qty 2

## 2021-01-06 MED ORDER — MELATONIN 3 MG PO TABS
3.0000 mg | ORAL_TABLET | Freq: Once | ORAL | Status: AC
  Administered 2021-01-06: 3 mg
  Filled 2021-01-06: qty 1

## 2021-01-06 MED ORDER — TRACE MINERALS CU-MN-SE-ZN 300-55-60-3000 MCG/ML IV SOLN
INTRAVENOUS | Status: AC
  Filled 2021-01-06: qty 936

## 2021-01-06 MED ORDER — POTASSIUM PHOSPHATES 15 MMOLE/5ML IV SOLN
40.0000 mmol | Freq: Once | INTRAVENOUS | Status: DC
  Filled 2021-01-06: qty 13.33

## 2021-01-06 MED ORDER — POTASSIUM PHOSPHATES 15 MMOLE/5ML IV SOLN
30.0000 mmol | Freq: Once | INTRAVENOUS | Status: AC
  Administered 2021-01-06: 30 mmol via INTRAVENOUS
  Filled 2021-01-06: qty 10

## 2021-01-06 MED ORDER — HYDRALAZINE HCL 20 MG/ML IJ SOLN
10.0000 mg | INTRAMUSCULAR | Status: DC | PRN
  Administered 2021-01-06: 10 mg via INTRAVENOUS
  Filled 2021-01-06: qty 1

## 2021-01-06 NOTE — Progress Notes (Signed)
6 Days Post-Op   Subjective/Chief Complaint: Mental status worse today. plts down to 21,000   Objective: Vital signs in last 24 hours: Temp:  [97.9 F (36.6 C)-99.2 F (37.3 C)] 97.9 F (36.6 C) (02/06 0700) Pulse Rate:  [53-137] 66 (02/06 0700) Resp:  [14-31] 15 (02/06 0700) SpO2:  [93 %-100 %] 100 % (02/06 0700) Arterial Line BP: (85-186)/(41-72) 186/72 (02/06 0700) Weight:  [58.1 kg] 58.1 kg (02/06 0500) Last BM Date: 01/03/21  Intake/Output from previous day: 02/05 0701 - 02/06 0700 In: 2630.6 [I.V.:1720.7; NG/GT:400; IV Piggyback:509.9] Out: 2482 [Drains:100] Intake/Output this shift: No intake/output data recorded.  General appearance: slowed mentation Resp: clear to auscultation bilaterally Cardio: regular rate and rhythm GI: soft, nontender. ostomy pink with air in bag  Lab Results:  Recent Labs    01/04/21 0304 01/04/21 0834 01/05/21 0827 01/05/21 2027  WBC 12.2*  --  11.2*  --   HGB 6.8*   < > 8.2* 8.5*  HCT 21.3*   < > 25.7* 25.0*  PLT 32*  --  21*  --    < > = values in this interval not displayed.   BMET Recent Labs    01/05/21 1847 01/05/21 2027 01/06/21 0403  NA 133* 136 136  K 3.4* 3.5 3.3*  CL 100  --  103  CO2 19*  --  22  GLUCOSE 298*  --  252*  BUN 48*  --  58*  CREATININE 2.11*  --  2.43*  CALCIUM 7.4*  --  7.2*   PT/INR Recent Labs    01/04/21 0304 01/05/21 0516  LABPROT 15.9* 14.8  INR 1.3* 1.2   ABG Recent Labs    01/05/21 2027  PHART 7.546*  HCO3 22.1    Studies/Results: CT HEAD WO CONTRAST  Result Date: 01/06/2021 CLINICAL DATA:  New onset delirium and confusion. EXAM: CT HEAD WITHOUT CONTRAST TECHNIQUE: Contiguous axial images were obtained from the base of the skull through the vertex without intravenous contrast. COMPARISON:  MRI 11/16/2020.  Head CT 01/25/2020. FINDINGS: Brain: The brainstem and cerebellum are normal. There is subacute infarction in the right posterior cerebral artery territory affecting the  posteromedial temporal lobe and occipital lobe. Mild swelling but no evidence of mass effect or shift. No hemorrhage. Cerebral hemispheres otherwise show age related atrophy but no second insult. No hydrocephalus or extra-axial collection Vascular: There is atherosclerotic calcification of the major vessels at the base of the brain. Skull: Normal Sinuses/Orbits: Chronic sinusitis with small air-fluid levels in the paranasal sinuses. Orbits negative. Other: None IMPRESSION: Subacute infarction in the right posterior cerebral artery territory affecting the posteromedial temporal lobe and occipital lobe. Mild swelling but no evidence of mass effect or shift. No hemorrhage. Electronically Signed   By: Nelson Chimes M.D.   On: 01/06/2021 00:27    Anti-infectives: Anti-infectives (From admission, onward)   Start     Dose/Rate Route Frequency Ordered Stop   01/03/21 1300  piperacillin-tazobactam (ZOSYN) IVPB 3.375 g  Status:  Discontinued        3.375 g 12.5 mL/hr over 240 Minutes Intravenous Every 6 hours 01/03/21 1204 01/03/21 1204   01/03/21 1300  piperacillin-tazobactam (ZOSYN) IVPB 3.375 g        3.375 g 100 mL/hr over 30 Minutes Intravenous Every 6 hours 01/03/21 1205 01/05/21 0642   12/16/2020 1600  piperacillin-tazobactam (ZOSYN) IVPB 2.25 g  Status:  Discontinued        2.25 g 100 mL/hr over 30 Minutes Intravenous Every 8  hours 12/15/2020 1507 01/03/21 1204   12/12/2020 1315  piperacillin-tazobactam (ZOSYN) IVPB 3.375 g  Status:  Discontinued        3.375 g 12.5 mL/hr over 240 Minutes Intravenous To Surgery 12/05/2020 1301 01/01/21 0846   12/09/2020 1500  piperacillin-tazobactam (ZOSYN) IVPB 3.375 g        3.375 g 12.5 mL/hr over 240 Minutes Intravenous Every 12 hours 12/05/2020 1219 12/24/20 2001   12/13/2020 1200  piperacillin-tazobactam (ZOSYN) IVPB 2.25 g  Status:  Discontinued        2.25 g 100 mL/hr over 30 Minutes Intravenous Every 8 hours 12/19/2020 0205 12/30/2020 1219   12/30/2020 0215   piperacillin-tazobactam (ZOSYN) IVPB 3.375 g        3.375 g 100 mL/hr over 30 Minutes Intravenous  Once 12/02/2020 0205 12/26/2020 0435      Assessment/Plan: s/p Procedure(s): EXPLORATORY LAPAROTOMY (N/A) LEFT COLECTOMY (N/A) TRANSVERSE COLOSTOMY (N/A) Mental status much worse today  Continue tube feeds at low rate  End-stage renal disease Chronic diarrhea/colitis since 10/2020 Hx orthostatic hypotension Hx PAF-on anticoagulation, currently held Covid positive Bedbound Anemia/thrombocytopenia - H/H 9.4/28.9>>7.8/23.6>>6.3/19.1 - platelets 65K>>44K>>36K>>21K Protein calorie malnutrition-severe(prealbumin<5)   Pneumoperitoneum, pneumatosis, portal venous gas s/p negativeexploratory laparotomy1/20 Dr. Jolaine Artist - intraop: no acute findings, no contamination, excellent blood flow to intestine,congested rectosigmoid colon -Worsening pneumoperitoneum Exploratory laparotomy, left colectomy, end colostomy, 12/02/2020 Dr. Rolm Bookbinder POD #6 - post-op ileus, await ROBF, continue TPN  Chronic stage IV sacral decubitus pressure ulcer(12/10/20, first picture AP[facility]) - wound evaluated2/3, peri wound with some necrosis, base overall clean, draining purulent material. He is not ill/septic from this. May benefit from debridement in the OR at some point if the patient shows overall clinical improvement but would not recommend right now.  ID-zosyn 1/20-1/25, restart1/31>> day6 (plan to stop abx at [facility], may FEN- npo/TPN VTE-SCD's, chemical VTE held due to anemia requiring transfusion and thrombocytopenia (plts 21)  Plan: Continue acute care per CCM.Await return of bowel function  LOS: 17 days    Autumn Messing III 01/06/2021

## 2021-01-06 NOTE — Progress Notes (Signed)
Patient ID: [name], male   DOB: 1951-09-23, 70 y.o.   MRN: [id number] S: Pt taken off of CRRT last night when he became confused and had issues with the machine alarming.  He was also noted to have platelets of 21 and Hgb 6.6. O:BP (!) 158/101   Pulse 80   Temp 97.7 F (36.5 C) (Oral)   Resp 16   Ht 6\' 2"  (1.88 m)   Wt 58.1 kg   SpO2 100%   BMI 16.45 kg/m   Intake/Output Summary (Last 24 hours) at 01/06/2021 1105 Last data filed at 01/06/2021 0800 Gross per 24 hour  Intake 2631.08 ml  Output 2910 ml  Net -278.92 ml   Intake/Output: I/O last 3 completed shifts: In: 4051.3 [I.V.:2801.5; NG/GT:640; IV Piggyback:609.8] Out: 2694 [Drains:250; WNIOE:7035]  Intake/Output this shift:  Total I/O In: 549 [I.V.:449; NG/GT:100] Out: -  Weight change: -9.9 kg Gen: frail, chronically ill appearing cachectic man  CVS: rrr Resp: occ rhonchi Abd: +BS, colostomy in LLQ, bandage over incision RLQ Ext: 1+ edema  Recent Labs  Lab 12/20/2020 0325 12/16/2020 1335 01/01/21 0254 01/02/21 0305 01/03/21 0301 01/03/21 1641 01/04/21 0304 01/04/21 1604 01/05/21 0516 01/05/21 1847 01/05/21 2027 01/06/21 0403  NA 134*   < > 133* 131* 132* 133* 133* 136 134* 133* 136 136  K 4.3   < > 4.7 5.2* 4.0 3.9 3.8 3.7 3.3* 3.4* 3.5 3.3*  CL 100   < > 100 98 99 99 100 102 101 100  --  103  CO2 16*   < > 14* 14* 17* 17* 18* 18* 19* 19*  --  22  GLUCOSE 122*   < > 131* 216* 237* 269* 351* 196* 261* 298*  --  252*  BUN 79*   < > 88* 104* 81* 69* 77* 68* 56* 48*  --  58*  CREATININE 6.18*   < > 6.06* 6.45* 4.92* 3.88* 4.25* 3.38* 2.60* 2.11*  --  2.43*  ALBUMIN 1.6*  --  1.7* 1.7* 1.6* 1.8* 1.5* 1.7* 1.6* 1.6*  --  1.4*  CALCIUM 7.6*   < > 7.3* 7.5* 7.1* 7.3* 7.1* 7.3* 7.3* 7.4*  --  7.2*  PHOS  --    < > 6.1* 7.2* 5.2* 4.0 3.6 2.2* 1.3* 1.4*  --  1.3*  AST 200*  --  103* 58* 33  --  40  --   --   --   --   --   ALT 161*  --  108* 86* 61*  --  50*  --   --   --   --   --    < > = values in this interval not  displayed.   Liver Function Tests: Recent Labs  Lab 01/02/21 0305 01/03/21 0301 01/03/21 1641 01/04/21 0304 01/04/21 1604 01/05/21 0516 01/05/21 1847 01/06/21 0403  AST 58* 33  --  40  --   --   --   --   ALT 86* 61*  --  50*  --   --   --   --   ALKPHOS 120 99  --  98  --   --   --   --   BILITOT 1.2 0.9  --  2.2*  --   --   --   --   PROT 4.6* 4.3*  --  4.2*  --   --   --   --   ALBUMIN 1.7* 1.6*   < > 1.5*   < >  1.6* 1.6* 1.4*   < > = values in this interval not displayed.   No results for input(s): LIPASE, AMYLASE in the last 168 hours. No results for input(s): AMMONIA in the last 168 hours. CBC: Recent Labs  Lab 01/02/21 0305 01/03/21 0439 01/03/21 1308 01/04/21 0304 01/04/21 0834 01/05/21 0827 01/05/21 2027 01/06/21 0836  WBC 10.9* 11.6* 11.9* 12.2*  --  11.2*  --  8.6  NEUTROABS 9.9* 10.6*  --  11.0*  --   --   --   --   HGB 7.7* 6.3* 8.2* 6.8*   < > 8.2* 8.5* 6.6*  HCT 24.0* 19.1* 24.5* 21.3*   < > 25.7* 25.0* 19.8*  MCV 94.9 95.5 93.2 95.5  --  92.8  --  93.0  PLT 49* 36* 38* 32*  --  21*  --  43*   < > = values in this interval not displayed.   Cardiac Enzymes: No results for input(s): CKTOTAL, CKMB, CKMBINDEX, TROPONINI in the last 168 hours. CBG: Recent Labs  Lab 01/05/21 1645 01/05/21 1959 01/05/21 2319 01/06/21 0349 01/06/21 0751  GLUCAP 307* 267* 281* 206* 230*    Iron Studies: No results for input(s): IRON, TIBC, TRANSFERRIN, FERRITIN in the last 72 hours. Studies/Results: CT HEAD WO CONTRAST  Result Date: 01/06/2021 CLINICAL DATA:  New onset delirium and confusion. EXAM: CT HEAD WITHOUT CONTRAST TECHNIQUE: Contiguous axial images were obtained from the base of the skull through the vertex without intravenous contrast. COMPARISON:  MRI 11/16/2020.  Head CT 01/25/2020. FINDINGS: Brain: The brainstem and cerebellum are normal. There is subacute infarction in the right posterior cerebral artery territory affecting the posteromedial temporal lobe  and occipital lobe. Mild swelling but no evidence of mass effect or shift. No hemorrhage. Cerebral hemispheres otherwise show age related atrophy but no second insult. No hydrocephalus or extra-axial collection Vascular: There is atherosclerotic calcification of the major vessels at the base of the brain. Skull: Normal Sinuses/Orbits: Chronic sinusitis with small air-fluid levels in the paranasal sinuses. Orbits negative. Other: None IMPRESSION: Subacute infarction in the right posterior cerebral artery territory affecting the posteromedial temporal lobe and occipital lobe. Mild swelling but no evidence of mass effect or shift. No hemorrhage. Electronically Signed   By: Nelson Chimes M.D.   On: 01/06/2021 00:27   . alteplase  2 mg Intracatheter Once  . vitamin C  500 mg Oral Daily  . chlorhexidine  15 mL Mouth Rinse BID  . Chlorhexidine Gluconate Cloth  6 each Topical Q0600  . collagenase   Topical Daily  . feeding supplement (VITAL 1.5 CAL)  1,000 mL Per Tube Q24H  . insulin aspart  0-20 Units Subcutaneous Q4H  . mouth rinse  15 mL Mouth Rinse q12n4p  . midodrine  10 mg Oral TID WC  . mupirocin ointment   Nasal BID  . pantoprazole (PROTONIX) IV  40 mg Intravenous Q24H  . sodium chloride flush  10-40 mL Intracatheter Q12H  . zinc sulfate  220 mg Oral Daily    BMET    Component Value Date/Time   NA 136 01/06/2021 0403   K 3.3 (L) 01/06/2021 0403   CL 103 01/06/2021 0403   CO2 22 01/06/2021 0403   GLUCOSE 252 (H) 01/06/2021 0403   BUN 58 (H) 01/06/2021 0403   CREATININE 2.43 (H) 01/06/2021 0403   CALCIUM 7.2 (L) 01/06/2021 0403   GFRNONAA 28 (L) 01/06/2021 0403   GFRAA 19 (L) 06/15/2018 0407   CBC    Component  Value Date/Time   WBC 8.6 01/06/2021 0836   RBC 2.13 (L) 01/06/2021 0836   HGB 6.6 (LL) 01/06/2021 0836   HCT 19.8 (L) 01/06/2021 0836   PLT 43 (L) 01/06/2021 0836   MCV 93.0 01/06/2021 0836   MCH 31.0 01/06/2021 0836   MCHC 33.3 01/06/2021 0836   RDW 20.6 (H) 01/06/2021  0836   LYMPHSABS 0.4 (L) 01/04/2021 0304   MONOABS 0.7 01/04/2021 0304   EOSABS 0.0 01/04/2021 0304   BASOSABS 0.0 01/04/2021 0304    [facility].5[facility] dry wt per OP HD RN/ R IJ TDC   Assessment/Plan: 1. Ischemic bowel withPneumoperitoneum/ shock -sp exlap, no perforated viscous found.repeatAbd films with worsening pneumoperitoneum ands/p left colectomy/colostomy 12/27/2020. 2. ESRD - on HD TTS.Not able to UF with IHD 1. Initiated CRRT 01/03/21 but had issues and really only had 2 good days of CRRT with UF of 4300 mls  2. CRRT stopped last night due to issues with filter and pt's agitation.   3. Will attempt IHD on Monday, however if he is unable to tolerate HD, will need to revisit transition to comfort care. 4. Continue with home dose of midodrine 3. Covid positive -off isolation 4. Orthostatic hypotension - chronic issue, pt is bed-bound it appears and does stretcher dialysis w/ the group in [facility]. On midodrine and fluorinef outpatient per charting.  5. BP /volume - has sig LE/ UE edema, but able to UF 4 liters with CRRT for 48 hours., no resp issues. Unable to UF large amts due to low alb/ 3rd spacing of fluid.  6. DM2 - per primary team  7. Anemia ckd - no emergent need for PRBC's. need records re: ESA dosing.  8. Hypocalcemia - repleted; added ca bath 9. Hypophosphatemia - will give IV phos again and follow.  10. Thrombocytopenia- transfuse as needed 11. Prognosis - very poorand has been seen by Palliative care in the pastand no evidence of improvement.He has severe autonomic dysfunction and is predominantly bed bound and can only get dialysis on a stretcher. Now with perforated colon s/p colectomy/colostomy, ongoing hypotension and hypoalbuminemia with poor chance of meaningful recovery or healing.Appreciate Palliative care assistance with goals of care and possible transition to comfort care.  Donetta Potts,  MD [name] [phone]

## 2021-01-06 NOTE — Progress Notes (Signed)
NAME:  [name], MRN:  [id number], DOB:  [birth year], LOS: 21 ADMISSION DATE:  12/27/2020, CONSULTATION DATE: 12/28/2020 REFERRING MD:  Jesusita Oka, MD, CHIEF COMPLAINT: Acute hypoxic respiratory failure post laparotomy  Brief History:  70 year old male with end-stage renal disease on dialysis TTS, admitted with nausea, vomiting, and abdominal pain, noted to have pneumoperitoneum.  Underwent laparotomy 1/20 which was negative.  Incidentally found to be COVID-positive. On 1/30, his LFTs started to elevate and KUB showed worsening intraperitoneal air.  He was taken back to OR 1/31 for ex lap with end colostomy and left colectomy.  Returns to ICU on two vasopressors and remains intubated secondary to hemodynamic instability.   History of Present Illness:  70 year old male with diabetes, ESRD on HD, TTS, chronic diarrhea, chronic hypotension, PAF - systemic AC on hold w/hx of GIB, admitted 1/19 from SNF with N/V and abd pain found to have pneumoperitoneum.   Prior to this he had prolonged hospitalization 12/3- 1/10  at [facility] with diarrhea, dehydration/ hypotension, deconditioning being bed bound with sacral decub, then previously 11/12- 12/2 with syncope and colitis.   Underwent laparotomy 1/20 without findings of bowel perforation.  Incidentally COVID positive.  He was transferred to [facility] on 1/22.  He was doing better however his LFTs elevated on 1/30 and KUB 1/31 showed worsening intraperitoneal air.  Of note he has previously had multiple ulcers in the sigmoid colon of nonspecific etiology.  He was taken back to the OR today, 1/31 and underwent exploratory laparotomy with end colostomy and left colectomy- noting that entire left colon was boggy thought secondary to ischemia; also noted to have ulcerated area on anterior rectum.  Post operative, he returns to ICU on two vasopressors and remains intubated due to hemodynamic instability. EBL 50 ml.  PCCM re-consulted for ICU management.   Past Medical  History:  End-stage renal disease on dialysis Diabetes type 2 Paroxysmal A. fib Orthostatic hypotension Unstageable sacral decubitus ulcer (POA)  Significant Hospital Events:  1/19>> Admit to [facility] for abdominal pain-pneumoperitoneum on imaging studies- 1/20 exploratory laparotomy negative/ extubated  1/21 iHD, low dose phenyl needed, weaned off after iHD stopped, started on stress dose steroids - got 1 dose and was stopped, resumed home midodrine 1/22>> transferred to [facility] service. 1/26>> plans to discharge back to SNF-however upon further evaluation-needed inpatient hydrotherapy for worsening sacral wound. 1/27>> general surgery consulted for sacral wound-continue hydrotherapy for few more days. 1/31 Back to the OR for Pneumoperitoneum ( Exp. Lap Left colectomy, end colectomy), to ICU for shock post op, remains on vent  Consults:  [facility] Nephrology   Procedures:  1/10 right subclavian DL TDC >>  1/20 ex lap >> neg 1/31 ex lap >> end colostomy/ left colectomy   ETT 1/20; 1/31 >> Left radial aline 1/31 >>  Significant Diagnostic Tests:  1/20 CT abdomen and pelvis: There is a focal area of wall thickening with adjacent perforation of the distal descending colon at the junction of the sigmoid colon, likely the transition point. 2. There is moderately dilated sigmoid colon and rectum which is stool-filled and suggestion of possible pneumatosis. 3. Free air seen under the right hemidiaphragm and within the porta hepatis. 4. Small amount of air seen within the inferior right liver lobe which is concerning for portal venous gas given the patient's findings 5. Bilateral sacral decubitus ulceration with phlegmon and subcutaneous emphysema extending to the inferior coccyx.  1/31 KUB >> Massive pneumoperitoneum again noted. Note that this typically more than  would be expected given laparotomy on 12/11/2020 and an ongoing air leak secondary to visceral perforation should be considered.   Repeat CT imaging with oral contrast may be helpful to identify the source of leak.  Suspected long segment colitis involving the distal descending and sigmoid colon.  2/1 KUB>> stable massive pneumoperitoneum; gastric tube in stomach   Micro Data:  1/20 influenza PCR negative 1/20 COVID PCR positive 1/21 MRSA PCR >> positive  1/31 BCx2 >> NGTD  Antimicrobials:  Zosyn 1/19 > 1/24; 1/31 >> 2/4  Interim History / Subjective:  Tolerating CRRT on low dose NE. Unable to wean.  Objective   Blood pressure (!) 158/101, pulse 80, temperature 97.7 F (36.5 C), temperature source Oral, resp. rate 16, height 6\' 2"  (1.88 m), weight 58.1 kg, SpO2 100 %.        Intake/Output Summary (Last 24 hours) at 01/06/2021 1112 Last data filed at 01/06/2021 0800 Gross per 24 hour  Intake 2631.08 ml  Output 2910 ml  Net -278.92 ml   Filed Weights   01/04/21 0500 01/05/21 0500 01/06/21 0500  Weight: 71.4 kg 68 kg 58.1 kg   Physical Exam: General: Chronically ill-appearing, cachectic, thin malnourished, no acute distress HENT: [place], AT, on [place] Eyes: EOMI, no scleral icterus Respiratory: Clear to auscultation bilaterally.  No crackles, wheezing or rales Cardiovascular: RRR, -M/R/G, no JVD GI: BS+, open abdomen, left lower quadrant ostomy Extremities: 2+ pitting edema in upper and lower extremities Neuro: Awake and alert, follows commands Skin: Sacral wound not visualized   Resolved Hospital Problem list   Large bowel obstruction status post exploratory laparotomy Acute metabolic acidosis COVID 19 infection- off isolation   Assessment & Plan:  Pneumoperitoneum s/p ex-lap resulting in left colectomy with end colostomy  Ischemic bowel - s/p end colostomy and left colectomy 1/31, evidence of ischemic left colon - Management per [facility] - no role for additional surgery -TPN per pharmacy started 2/1  - trickle TF 2/4, attempt advance once has BM  Hypotension, Septic shock, hx orthostatic: multifactorial-  poor nutrition, chronic hypotension, AI, sedation, rule out intra-abdominal infection given microperforation/ pneumoperitoneum, sacral ulcer - wean stress dose steroids to end 2/6 - trend CBC - follow blood cultures, ngtd - Zosyn for 5 days(ended 2/4) - Home midodrine 10 mg TID - wean pressors  Acute hypoxic respiratory insufficiency post-operatively, previously due to volume overload from missing HD session Open abdomen -Extubated 2/4 - volume removal via dialysis  ESRD on TTS (new to HD since 10/2020) - Dialysis per nephrology - trend renal indices  - continue foley for now, if ongoing low UOP, consider d/c foley > bladder scan, however UOP trend 1L-> 25 ml  Delirium: Encephalopathic overnight 2/5, improved on precedex. --precedex, wean as able  Diabetes type 2: Worsened hyperglycemia with stress dose steroids - SSI, lantus 20 u qhs  PAF w/RVR - remains in NSR - PO amiodarone held - hold betablocker - no systemic AC given prior GIB  Sacral wound- un-stageable, present on admission - per WOC/ surgery recommendations, hydrotherapy as tolerated  - no role for OR per surgery  Anemia- multifactorial, of chronic illness, ABLA - transfused 1 unit 1/29 , 1/30, 2/3 - remains on PPI BID - trend CBC - transfuse for Hgb < 7  Thrombocytopenia - suspect sepsis -  Platelets trend stable, continue to monitor/ trend  -  Transfuse for goal plt >10k  Chronic debility/ deconditioning -  mostly bed bound now, PT/ OT  Protein calorie malnutrition>> severe -  TPN per pharmacy  - TF trickle 2/4  Best practice (evaluated daily)  Diet: NPO, TPN, TF Pain/Anxiety/Delirium protocol (if indicated): n/a VAP protocol (if indicated): n/a DVT prophylaxis: SCDs only  GI prophylaxis: PPI  Glucose control: SSI sensitive Mobility: PT/OT Disposition: ICU  Goals of Care:   Code Status: Limitations Poor prognosis, Goals of care discussion with patient and palliative care 2/4 limits to no CPR,  otherwise would want everything else.  Labs   CBC: Recent Labs  Lab 12/05/2020 1724 12/08/2020 1751 01/01/21 0254 01/01/21 1730 01/02/21 0305 01/03/21 0439 01/03/21 1308 01/04/21 0304 01/04/21 0834 01/05/21 0827 01/05/21 2027 01/06/21 0836  WBC 8.4  --  10.3  --  10.9* 11.6* 11.9* 12.2*  --  11.2*  --  8.6  NEUTROABS 7.7  --  9.5*  --  9.9* 10.6*  --  11.0*  --   --   --   --   HGB 8.5*   < > 7.8*  --  7.7* 6.3* 8.2* 6.8* 7.8* 8.2* 8.5* 6.6*  HCT 25.9*   < > 23.6*  --  24.0* 19.1* 24.5* 21.3* 24.4* 25.7* 25.0* 19.8*  MCV 92.5  --  92.2  --  94.9 95.5 93.2 95.5  --  92.8  --  93.0  PLT 55*  51*  --  44*   < > 49* 36* 38* 32*  --  21*  --  43*   < > = values in this interval not displayed.    Basic Metabolic Panel: Recent Labs  Lab 01/02/21 0305 01/03/21 0301 01/03/21 1641 01/04/21 0304 01/04/21 1604 01/05/21 0516 01/05/21 1847 01/05/21 2027 01/06/21 0403  NA 131* 132*   < > 133* 136 134* 133* 136 136  K 5.2* 4.0   < > 3.8 3.7 3.3* 3.4* 3.5 3.3*  CL 98 99   < > 100 102 101 100  --  103  CO2 14* 17*   < > 18* 18* 19* 19*  --  22  GLUCOSE 216* 237*   < > 351* 196* 261* 298*  --  252*  BUN 104* 81*   < > 77* 68* 56* 48*  --  58*  CREATININE 6.45* 4.92*   < > 4.25* 3.38* 2.60* 2.11*  --  2.43*  CALCIUM 7.5* 7.1*   < > 7.1* 7.3* 7.3* 7.4*  --  7.2*  MG 1.9 1.8  --  1.8  --  2.0  --   --  2.0  PHOS 7.2* 5.2*   < > 3.6 2.2* 1.3* 1.4*  --  1.3*   < > = values in this interval not displayed.   GFR: Estimated Creatinine Clearance: 23.2 mL/min (A) (by C-G formula based on SCr of 2.43 mg/dL (H)). Recent Labs  Lab 12/15/2020 1724 01/01/21 0254 01/03/21 1308 01/04/21 0304 01/05/21 0827 01/06/21 0836  WBC 8.4   < > 11.9* 12.2* 11.2* 8.6  LATICACIDVEN 1.5  --   --   --   --   --    < > = values in this interval not displayed.    Liver Function Tests: Recent Labs  Lab 12/06/2020 0325 01/01/21 0254 01/02/21 0305 01/03/21 0301 01/03/21 1641 01/04/21 0304 01/04/21 1604  01/05/21 0516 01/05/21 1847 01/06/21 0403  AST 200* 103* 58* 33  --  40  --   --   --   --   ALT 161* 108* 86* 61*  --  50*  --   --   --   --  ALKPHOS 167* 107 120 99  --  98  --   --   --   --   BILITOT 0.8 1.3* 1.2 0.9  --  2.2*  --   --   --   --   PROT 4.7* 4.5* 4.6* 4.3*  --  4.2*  --   --   --   --   ALBUMIN 1.6* 1.7* 1.7* 1.6*   < > 1.5* 1.7* 1.6* 1.6* 1.4*   < > = values in this interval not displayed.   No results for input(s): LIPASE, AMYLASE in the last 168 hours. No results for input(s): AMMONIA in the last 168 hours.  ABG    Component Value Date/Time   PHART 7.546 (H) 01/05/2021 2027   PCO2ART 25.5 (L) 01/05/2021 2027   PO2ART 68 (L) 01/05/2021 2027   HCO3 22.1 01/05/2021 2027   TCO2 23 01/05/2021 2027   ACIDBASEDEF 5.0 (H) 12/29/2020 1751   O2SAT 96.0 01/05/2021 2027     Coagulation Profile: Recent Labs  Lab 01/01/21 1730 01/02/21 0305 01/03/21 0301 01/04/21 0304 01/05/21 0516  INR 1.1 1.1 1.2 1.3* 1.2    Cardiac Enzymes: No results for input(s): CKTOTAL, CKMB, CKMBINDEX, TROPONINI in the last 168 hours.  HbA1C: Hgb A1c MFr Bld  Date/Time Value Ref Range Status  12/21/2020 12:08 PM 4.1 (L) 4.8 - 5.6 % Final    Comment:    (NOTE) Pre diabetes:          5.7%-6.4%  Diabetes:              >6.4%  Glycemic control for   <7.0% adults with diabetes   06/13/2018 03:16 AM 6.6 (H) 4.8 - 5.6 % Final    Comment:    (NOTE) Pre diabetes:          5.7%-6.4% Diabetes:              >6.4% Glycemic control for   <7.0% adults with diabetes     CBG: Recent Labs  Lab 01/05/21 1645 01/05/21 1959 01/05/21 2319 01/06/21 0349 01/06/21 0751  GLUCAP 307* 267* 281* 206* 230*    CRITICAL CARE Performed by: Bonna Gains Hunsucker   Total critical care time: 31 minutes  Critical care time was exclusive of separately billable procedures and treating other patients.  Critical care was necessary to treat or prevent imminent or life-threatening  deterioration.  Critical care was time spent personally by me on the following activities: development of treatment plan with patient and/or surrogate as well as nursing, discussions with consultants, evaluation of patient's response to treatment, examination of patient, obtaining history from patient or surrogate, ordering and performing treatments and interventions, ordering and review of laboratory studies, ordering and review of radiographic studies, pulse oximetry and re-evaluation of patient's condition.    Lanier Clam, MD

## 2021-01-06 NOTE — Progress Notes (Addendum)
PHARMACY - TOTAL PARENTERAL NUTRITION CONSULT NOTE   Indication: Massive bowel resection and severe malnutrition  Patient Measurements: Height: 6\' 2"  (188 cm) Weight: 58.1 kg (128 lb 1.4 oz) IBW/kg (Calculated) : 82.2 TPN AdjBW (KG): 69.2 Body mass index is 16.45 kg/m.  Assessment: 38 yom male with diabetes, ESRDon HD, TTS,chronic diarrhea, chronic hypotension, PAF - systemic AC on hold w/hx of GIB, admitted 1/19 from SNF with N/V and abd pain, found to have pneumoperitoneum. Underwent laparotomy 1/20 without findings of bowel perforation.Incidentally COVID positive. He was transferred to [facility] on 1/22 and was doing better; however, LFTs elevated on 1/30, and KUB 1/31 showed worsening intraperitoneal air. Of note, previously had multiple ulcers in sigmoid colon of nonspecific etiology.He was taken back to OR 1/31 and underwent exploratory laparotomy with end colostomy and left colectomy, noting entire left colon was boggy, thought secondary to ischemia; also noted to have ulcerated area on anterior rectum. Pharmacy consulted for TPN. Noted, patient bed-bound at baseline.  Patient extubated 2/4. Low-dose norepinephrine off this AM. CRRT stopped overnight due to filter issues. Attempting IHD on 2/7 per Nephrology.  Glucose / Insulin: CBGs 200-298. Utilized 51 units SSI in last 24hrs. Stress steroids weaned 2/4 to [facility] 50mg  q12h (plan d/c after 2/6 doses) Electrolytes: K 3.3 (s/p KPhos 67mmol x 1 yesterday; none in TPN), Phos stable 1.3 (s/p KPhos 33mmol IV x 1 yesterday); others WNL Renal: ESRD on HD TTS PTA. CRRT 2/3>>2/7. To attempt IHD 2/7 LFTs / TGs: AST normalized, ALT trend down to 50, TBili up to 2.2, TG WNL Prealbumin / albumin: Prealbumin <5, albumin 1.4 Intake / Output; MIVF: Drain output 60 ml/24hrs. LBM 2/3 GI Imaging: 2/1 Abd Xray - Stable massive pneumoperitoneum Surgeries / Procedures: 2/4 Cortrak placed  Central access: LIJ triple lumen CVC placed 01/01/21 TPN start date:  01/01/21  Nutritional Goals: (per RD recommendation on 2/4) Kcal:1900-2200kcal; Protein:140-170 g; Fluid:1000 mL plus UOP Goal TPN rate:  75 ml/hr with SMOF lipids 50g on MWF only - will provide a weekly average of 1940 kcal and 140g protein per day  Current Nutrition:  NPO  TPN Vital 1.5 started 2/4 at 3ml/hr - continue today at low rate per Surgery  Plan:  Continue concentrated TPN at goal rate 23mL/hr at 1800 (recalculated with reduced dextrose content 2/6) Given national shortage of ILE, will provide lipids on MWF only Electrolytes in TPN: increase to 52mmol/L of Phos; otherwise continue same today - 64mEq/L of Na, 29mEq/L of K, 72mEq/L of Ca, 4 mEq/L of Mg. Max acetate Give KPhos 60mmol x 1 per discussion with Nephrology (initial orders for 54mmol x 1 but dose reduced now that CRRT off per Dr. Marval Regal) Add standard MVI and trace elements to TPN. Remove chromium with patient on RRT. Continue resistant SSI q4h (will not add insulin to TPN bag with stress steroids to d/c 2/6 and dextrose content reduced in TPN). Reassess insulin needs 2/7 Monitor TPN labs, Nephrology plans, and [place] discussions F/u Surgery plans to advance TF and ability to wean TPN   Arturo Morton, PharmD, BCPS Please check AMION for all [facility] contact numbers Clinical Pharmacist 01/06/2021 8:08 AM

## 2021-01-06 NOTE — Progress Notes (Signed)
CRITICAL VALUE ALERT  Critical Value:  Hgb 6.6  Date & Time Notied:  01/06/21 0911  Provider Notified: Dr. Silas Flood  Orders Received/Actions taken: Transfuse 1 unit PRBC

## 2021-01-06 NOTE — Progress Notes (Addendum)
eLink Physician-Brief Progress Note Patient Name: [name] DOB: 1951/04/15 MRN: [id number]   Date of Service  01/06/2021  HPI/Events of Note  HTN SBP 185, has been off pressors since 7 am and has had persistently high Bps. Will try low dose hydralazine.   eICU Interventions  Order placed     Intervention Category Major Interventions: Hypertension - evaluation and management  Sharley Keeler G Gershon Shorten 01/06/2021, 8:11 PM   11:10 pm Insomnia, otherwise feels well Try melatonin Initially thought of using precedex but like last night, tiny initial dose has also needed levophed Will try melatonin x 1

## 2021-01-07 DIAGNOSIS — K631 Perforation of intestine (nontraumatic): Secondary | ICD-10-CM | POA: Diagnosis not present

## 2021-01-07 DIAGNOSIS — E43 Unspecified severe protein-calorie malnutrition: Secondary | ICD-10-CM | POA: Diagnosis not present

## 2021-01-07 DIAGNOSIS — K668 Other specified disorders of peritoneum: Secondary | ICD-10-CM | POA: Diagnosis not present

## 2021-01-07 DIAGNOSIS — Z515 Encounter for palliative care: Secondary | ICD-10-CM | POA: Diagnosis not present

## 2021-01-07 LAB — CBC
HCT: 25.6 % — ABNORMAL LOW (ref 39.0–52.0)
Hemoglobin: 8.4 g/dL — ABNORMAL LOW (ref 13.0–17.0)
MCH: 30.2 pg (ref 26.0–34.0)
MCHC: 32.8 g/dL (ref 30.0–36.0)
MCV: 92.1 fL (ref 80.0–100.0)
Platelets: 65 10*3/uL — ABNORMAL LOW (ref 150–400)
RBC: 2.78 MIL/uL — ABNORMAL LOW (ref 4.22–5.81)
RDW: 19.9 % — ABNORMAL HIGH (ref 11.5–15.5)
WBC: 13.2 10*3/uL — ABNORMAL HIGH (ref 4.0–10.5)
nRBC: 0.3 % — ABNORMAL HIGH (ref 0.0–0.2)

## 2021-01-07 LAB — HEPATITIS B SURFACE ANTIGEN: Hepatitis B Surface Ag: NONREACTIVE

## 2021-01-07 LAB — TYPE AND SCREEN
ABO/RH(D): A POS
Antibody Screen: NEGATIVE
Unit division: 0

## 2021-01-07 LAB — COMPREHENSIVE METABOLIC PANEL
ALT: 62 U/L — ABNORMAL HIGH (ref 0–44)
AST: 47 U/L — ABNORMAL HIGH (ref 15–41)
Albumin: 1.5 g/dL — ABNORMAL LOW (ref 3.5–5.0)
Alkaline Phosphatase: 108 U/L (ref 38–126)
Anion gap: 16 — ABNORMAL HIGH (ref 5–15)
BUN: 93 mg/dL — ABNORMAL HIGH (ref 8–23)
CO2: 20 mmol/L — ABNORMAL LOW (ref 22–32)
Calcium: 7.4 mg/dL — ABNORMAL LOW (ref 8.9–10.3)
Chloride: 97 mmol/L — ABNORMAL LOW (ref 98–111)
Creatinine, Ser: 3.07 mg/dL — ABNORMAL HIGH (ref 0.61–1.24)
GFR, Estimated: 21 mL/min — ABNORMAL LOW (ref >60)
Glucose, Bld: 197 mg/dL — ABNORMAL HIGH (ref 70–99)
Potassium: 3.1 mmol/L — ABNORMAL LOW (ref 3.5–5.1)
Sodium: 133 mmol/L — ABNORMAL LOW (ref 135–145)
Total Bilirubin: 1.6 mg/dL — ABNORMAL HIGH (ref 0.3–1.2)
Total Protein: 4.3 g/dL — ABNORMAL LOW (ref 6.5–8.1)

## 2021-01-07 LAB — BPAM RBC
Blood Product Expiration Date: 202203022359
ISSUE DATE / TIME: 202202061110
Unit Type and Rh: 6200

## 2021-01-07 LAB — DIFFERENTIAL
Abs Immature Granulocytes: 0.11 10*3/uL — ABNORMAL HIGH (ref 0.00–0.07)
Basophils Absolute: 0 10*3/uL (ref 0.0–0.1)
Basophils Relative: 0 %
Eosinophils Absolute: 0.1 10*3/uL (ref 0.0–0.5)
Eosinophils Relative: 1 %
Immature Granulocytes: 1 %
Lymphocytes Relative: 6 %
Lymphs Abs: 0.9 10*3/uL (ref 0.7–4.0)
Monocytes Absolute: 0.8 10*3/uL (ref 0.1–1.0)
Monocytes Relative: 6 %
Neutro Abs: 11.4 10*3/uL — ABNORMAL HIGH (ref 1.7–7.7)
Neutrophils Relative %: 86 %

## 2021-01-07 LAB — GLUCOSE, CAPILLARY
Glucose-Capillary: 185 mg/dL — ABNORMAL HIGH (ref 70–99)
Glucose-Capillary: 242 mg/dL — ABNORMAL HIGH (ref 70–99)
Glucose-Capillary: 254 mg/dL — ABNORMAL HIGH (ref 70–99)
Glucose-Capillary: 124 mg/dL — ABNORMAL HIGH (ref 70–99)
Glucose-Capillary: 89 mg/dL (ref 70–99)
Glucose-Capillary: 101 mg/dL — ABNORMAL HIGH (ref 70–99)

## 2021-01-07 LAB — PREALBUMIN: Prealbumin: 9.7 mg/dL — ABNORMAL LOW (ref 18–38)

## 2021-01-07 LAB — PHOSPHORUS: Phosphorus: 2.4 mg/dL — ABNORMAL LOW (ref 2.5–4.6)

## 2021-01-07 LAB — MAGNESIUM: Magnesium: 1.9 mg/dL (ref 1.7–2.4)

## 2021-01-07 LAB — TRIGLYCERIDES: Triglycerides: 96 mg/dL (ref <150)

## 2021-01-07 MED ORDER — ALTEPLASE 2 MG IJ SOLR
INTRAMUSCULAR | Status: AC
  Administered 2021-01-07: 1 mg
  Filled 2021-01-07: qty 4

## 2021-01-07 MED ORDER — POTASSIUM PHOSPHATES 15 MMOLE/5ML IV SOLN
10.0000 mmol | Freq: Once | INTRAVENOUS | Status: AC
  Administered 2021-01-07: 10 mmol via INTRAVENOUS
  Filled 2021-01-07: qty 3.33

## 2021-01-07 MED ORDER — FLUDROCORTISONE ACETATE 0.1 MG PO TABS
0.2000 mg | ORAL_TABLET | Freq: Every day | ORAL | Status: DC
  Administered 2021-01-07 – 2021-01-08 (×2): 0.2 mg via ORAL
  Filled 2021-01-07 (×3): qty 2

## 2021-01-07 MED ORDER — AMIODARONE IV BOLUS ONLY 150 MG/100ML
150.0000 mg | Freq: Once | INTRAVENOUS | Status: DC
  Filled 2021-01-07: qty 100

## 2021-01-07 MED ORDER — POTASSIUM CHLORIDE 20 MEQ PO PACK
40.0000 meq | PACK | Freq: Once | ORAL | Status: AC
  Administered 2021-01-07: 40 meq
  Filled 2021-01-07: qty 2

## 2021-01-07 MED ORDER — ACETAMINOPHEN 160 MG/5ML PO SOLN
650.0000 mg | ORAL | Status: DC | PRN
  Administered 2021-01-07 – 2021-01-08 (×2): 650 mg
  Filled 2021-01-07 (×2): qty 20.3

## 2021-01-07 MED ORDER — ALTEPLASE 2 MG IJ SOLR
2.0000 mg | Freq: Once | INTRAMUSCULAR | Status: DC | PRN
  Filled 2021-01-07: qty 2

## 2021-01-07 MED ORDER — ALBUMIN HUMAN 25 % IV SOLN: 25.0000 g | Freq: Once | INTRAVENOUS | Status: AC

## 2021-01-07 MED ORDER — FENTANYL CITRATE (PF) 100 MCG/2ML IJ SOLN
50.0000 ug | Freq: Once | INTRAMUSCULAR | Status: AC
  Administered 2021-01-07: 50 ug via INTRAVENOUS
  Filled 2021-01-07: qty 2

## 2021-01-07 MED ORDER — VITAL 1.5 CAL PO LIQD: 1000.0000 mL | ORAL | Status: DC

## 2021-01-07 MED ORDER — TRACE MINERALS CU-MN-SE-ZN 300-55-60-3000 MCG/ML IV SOLN
INTRAVENOUS | Status: AC
  Filled 2021-01-07: qty 936

## 2021-01-07 MED ORDER — ALBUMIN HUMAN 25 % IV SOLN
INTRAVENOUS | Status: AC
  Administered 2021-01-07: 25 g
  Filled 2021-01-07: qty 200

## 2021-01-07 NOTE — Progress Notes (Signed)
Assisted tele visit to patient with daughter.  [name] M Monai Hindes, RN   

## 2021-01-07 NOTE — Progress Notes (Signed)
Physical Therapy Treatment Patient Details Name: [name] MRN: [id number] DOB: 1951-10-13 Today's Date: 01/07/2021    History of Present Illness 70 y.o. male with PMHx of ESRD, TThS chronic diarrhea (with extensive work-up in the past), orthostatic hypotension, PAF-anticoagulation on hold due to history of GI bleeding, -presented to the hospital from SNF [facility]) 12/18/20 due to PTAR not picking up for HD, as well as abdominal pain, nausea and vomiting-found to have pneumoperitoneum on imaging studies-underwent exploratory laparotomy 12/16/2020 without any findings of bowel perforation.  Also found to be COVID-positive. Currently being treated with hydrotherapy for sacral wound. 12/09/2020 underwent colectomy with colostomy. Pt intubated 1/31-2/4    PT Comments    Pt supine on arrival with flat affect reporting bil hip, low back and sacral pain without pain medicine available in [facility]. Due to pain pt unwilling to attempt mobility but was agreeable to bil UE /LE limited HEP and pt encouraged to continue. Pt stating desire to stand and walk but unable to state the last time he even stood up although pt was reportedly very independent prior to Dec. Will continue to attempt.     Follow Up Recommendations  SNF     Equipment Recommendations  Wheelchair (measurements PT);Wheelchair cushion (measurements PT);Hospital bed    Recommendations for Other Services       Precautions / Restrictions Precautions Precautions: Fall;Other (comment) Precaution Comments: sacral wound    Mobility  Bed Mobility               General bed mobility comments: pt would not agree to attempt rolling or any mobility this session and only agreeable to extremity exercise.  Transfers                    Ambulation/Gait                 Stairs             Wheelchair Mobility    Modified Rankin (Stroke Patients Only)       Balance                                             Cognition Arousal/Alertness: Awake/alert Behavior During Therapy: Flat affect Overall Cognitive Status: Difficult to assess                                 General Comments: limited ability to provide accurate history and limited participattion      Exercises General Exercises - Upper Extremity Shoulder Flexion: Strengthening;AAROM;Both;10 reps;Supine Shoulder ADduction: Strengthening;AAROM;Both;10 reps;Supine Elbow Flexion: Strengthening;10 reps;Supine;AAROM;Both Elbow Extension: Strengthening;Both;10 reps;Supine;AAROM General Exercises - Lower Extremity Heel Slides: AAROM;Both;Supine;10 reps Hip ABduction/ADduction: AAROM;Both;Supine;10 reps Heel Raises: AAROM;Both;Supine;10 reps    General Comments        Pertinent Vitals/Pain Pain Score: 8  Pain Location: bil hip, low back and sacral pain Pain Descriptors / Indicators: Constant;Guarding Pain Intervention(s): Limited activity within patient's tolerance;Patient requesting pain meds-RN notified    Home Living                      Prior Function            PT Goals (current goals can now be found in the care plan section) Progress towards PT goals: Not progressing toward goals -  comment (pt reports pain limiting any ability to participate in mobility)    Frequency    Min 2X/week      PT Plan Current plan remains appropriate    Co-evaluation              AM-PAC PT "6 Clicks" Mobility   Outcome Measure  Help needed turning from your back to your side while in a flat bed without using bedrails?: Total Help needed moving from lying on your back to sitting on the side of a flat bed without using bedrails?: Total Help needed moving to and from a bed to a chair (including a wheelchair)?: Total Help needed standing up from a chair using your arms (e.g., wheelchair or bedside chair)?: Total Help needed to walk in hospital room?: Total Help needed climbing 3-5 steps with a  railing? : Total 6 Click Score: 6    End of Session   Activity Tolerance: Patient limited by pain Patient left: in bed;with call bell/phone within reach;with bed alarm set Nurse Communication: Mobility status PT Visit Diagnosis: Muscle weakness (generalized) (M62.81);Pain;Difficulty in walking, not elsewhere classified (R26.2);Other abnormalities of gait and mobility (R26.89)     Time: 9233-0076 PT Time Calculation (min) (ACUTE ONLY): 22 min  Charges:  $Therapeutic Exercise: 8-22 mins                     [name], PT Acute Rehabilitation Services Pager: [phone] Office: [name] 01/07/2021, 1:07 PM

## 2021-01-07 NOTE — Progress Notes (Addendum)
7 Days Post-Op    AS:[id number]   Subjective: He is alert and appears comfortable off the Vent.  He has some liquid stool in his colostomy, some gas, colostomy is a bit swollen, but pink.  + BS, Midline OK, drain mostly serous drainage.  Objective: Vital signs in last 24 hours: Temp:  [97.6 F (36.4 C)-98 F (36.7 C)] 97.6 F (36.4 C) (02/07 0320) Pulse Rate:  [65-92] 73 (02/07 0600) Resp:  [14-20] 14 (02/07 0600) BP: (177-184)/(63-69) 184/69 (02/06 2017) SpO2:  [97 %-100 %] 97 % (02/07 0600) Arterial Line BP: (115-177)/(38-67) 139/46 (02/07 0600) Weight:  [65.6 kg] 65.6 kg (02/07 0339) Last BM Date: 01/06/21 3000 IV 540 NG Drain 20 Afebrile, VSS - off pressors Na 133, K_3.1, glucose 197, AST47, ALT 62 Prealbumin 9.7 WBC 13.2  Intake/Output from previous day: 02/06 0701 - 02/07 0700 In: 3551 [I.V.:2158.4; Blood:325; NG/GT:540; IV Piggyback:527.6] Out: 50 [Drains:50] Intake/Output this shift: No intake/output data recorded.  General appearance: alert, cooperative and no distress Resp: comfortable off Vent, with good sats GI: soft, open midline is ok, some fat necrosis at the lower portion of the wound.  Ostomy OK, swelling, but pink, working with liquid stool inthe bag with gas,  Drian serous fluid, clear.  + BS   Wound today 2/7 after hydro, drainage better, more undermining at the 12 o'clock position on this photo up to 6 cm.  2 cm prior, clean and no odor today.  Plan to put some xeroform over the area breaking down at this 12o'clock position  01/03/21 picture.   Lab Results:  Recent Labs    01/06/21 0836 01/06/21 1607 01/07/21 0333  WBC 8.6  --  13.2*  HGB 6.6* 7.6* 8.4*  HCT 19.8* 23.7* 25.6*  PLT 43*  --  65*    BMET Recent Labs    01/06/21 0403 01/07/21 0333  NA 136 133*  K 3.3* 3.1*  CL 103 97*  CO2 22 20*  GLUCOSE 252* 197*  BUN 58* 93*  CREATININE 2.43* 3.07*  CALCIUM 7.2* 7.4*   PT/INR Recent Labs    01/05/21 0516  LABPROT  14.8  INR 1.2    Recent Labs  Lab 01/01/21 0254 01/02/21 0305 01/03/21 0301 01/03/21 1641 01/04/21 0304 01/04/21 1604 01/05/21 0516 01/05/21 1847 01/06/21 0403 01/07/21 0333  AST 103* 58* 33  --  40  --   --   --   --  47*  ALT 108* 86* 61*  --  50*  --   --   --   --  62*  ALKPHOS 107 120 99  --  98  --   --   --   --  108  BILITOT 1.3* 1.2 0.9  --  2.2*  --   --   --   --  1.6*  PROT 4.5* 4.6* 4.3*  --  4.2*  --   --   --   --  4.3*  ALBUMIN 1.7* 1.7* 1.6*   < > 1.5* 1.7* 1.6* 1.6* 1.4* 1.5*   < > = values in this interval not displayed.     Lipase     Component Value Date/Time   LIPASE 14 12/23/2020 0040   . albumin human    . dexmedetomidine (PRECEDEX) IV infusion Stopped (01/06/21 2300)  . norepinephrine (LEVOPHED) Adult infusion Stopped (01/07/21 0610)  . TPN ADULT (ION) 75 mL/hr at 01/07/21 0800     Medications: . alteplase  2 mg Intracatheter Once  .  vitamin C  500 mg Oral Daily  . chlorhexidine  15 mL Mouth Rinse BID  . Chlorhexidine Gluconate Cloth  6 each Topical Q0600  . collagenase   Topical Daily  . feeding supplement (VITAL 1.5 CAL)  1,000 mL Per Tube Q24H  . insulin aspart  0-20 Units Subcutaneous Q4H  . insulin glargine  20 Units Subcutaneous QHS  . mouth rinse  15 mL Mouth Rinse q12n4p  . midodrine  10 mg Oral TID WC  . mupirocin ointment   Nasal BID  . pantoprazole (PROTONIX) IV  40 mg Intravenous Q24H  . sodium chloride flush  10-40 mL Intracatheter Q12H  . zinc sulfate  220 mg Oral Daily    Assessment/Plan Mental status much worse today  Continue tube feeds at low rate End-stage renal disease Chronic diarrhea/colitis since 10/2020 Hx orthostatic hypotension Hx PAF-on anticoagulation, currently held Covid positive -off restrictions Bedbound Anemia/thrombocytopenia - H/H 9.4/28.9>>7.8/23.6>>6.3/19.1>>8.4/25.6  - Transfused 2/6, 2.4, 2/3,, 1/29 x 2 - platelets 65K>>44K>>36K>>21K>>65K Protein calorie  malnutrition-severe(prealbumin<5) >> 9.7   Pneumoperitoneum, pneumatosis, portal venous gas s/p negativeexploratory laparotomy1/20 Dr. Jolaine Artist - intraop: no acute findings, no contamination, excellent blood flow to intestine,congested rectosigmoid colon -Worsening pneumoperitoneum Exploratory laparotomy, left colectomy, end colostomy, 12/22/2020 Dr. Rolm Bookbinder POD #6 - post-op ileus, await ROBF, continue TPN  Chronic stage IV sacral decubitus pressure ulcer(12/10/20, first picture [facility]) - wound evaluated2/3, peri wound with some necrosis, base overall clean, draining purulent material. He is not ill/septic from this.  Site looks much better than last week.  ID-zosyn 1/20-1/25, restart1/31-01/03/21 FEN- npo/TPN/ TF started at 20 VTE-SCD's, chemical VTE held due to anemia requiring transfusion and thrombocytopenia (plts 21)   Plan:We will look at the buttocks wound with Hydrotherapy later this AM.  I would start increasing the tube feedings.  If he tolerates this we could beging to wean TNA tomorrow. Continue current Rx.  Add [facility] bed.       LOS: 18 days    [name] 01/07/2021 Please see Amion

## 2021-01-07 NOTE — Progress Notes (Addendum)
Daily Progress Note   Patient Name: [name]       Date: 01/07/2021 DOB: 1951/09/03  Age: 70 y.o. MRN#: [id number] Attending Physician: Lanier Clam, MD Primary Care Physician: Patient, No Pcp Per Admit Date: 12/28/2020  Reason for Consultation/Follow-up: Establishing goals of care  Subjective: Patient wakes to voice. Denies pain or discomfort. Appears comfortable on room air. Will answer simple questions but does not engage in detailed discussion today.   Chart reviewed and discussed with Dr. Silas Flood. Plan is to attempt iHD today.   GOC:  No family at bedside. Spoke with daughter, [name] via telephone. Provided update for today on plan of care. [name] is hopeful he will tolerate dialysis. She is also hopeful to eventually complete advance directive documentation with an attorney who she is in contact with. Reassured daughter that I will continue to follow and assist with these documents, recommending we try and complete on a day he does not receive dialysis. [name] has PMT contact information.   Length of Stay: 18  Current Medications: Scheduled Meds:  . alteplase  2 mg Intracatheter Once  . vitamin C  500 mg Oral Daily  . chlorhexidine  15 mL Mouth Rinse BID  . Chlorhexidine Gluconate Cloth  6 each Topical Q0600  . collagenase   Topical Daily  . feeding supplement (VITAL 1.5 CAL)  1,000 mL Per Tube Q24H  . fludrocortisone  0.2 mg Oral Daily  . insulin aspart  0-20 Units Subcutaneous Q4H  . insulin glargine  20 Units Subcutaneous QHS  . mouth rinse  15 mL Mouth Rinse q12n4p  . midodrine  10 mg Oral TID WC  . mupirocin ointment   Nasal BID  . pantoprazole (PROTONIX) IV  40 mg Intravenous Q24H  . sodium chloride flush  10-40 mL Intracatheter Q12H  . zinc sulfate  220  mg Oral Daily    Continuous Infusions: . albumin human    . dexmedetomidine (PRECEDEX) IV infusion Stopped (01/06/21 2300)  . norepinephrine (LEVOPHED) Adult infusion Stopped (01/07/21 0610)  . TPN ADULT (ION) 75 mL/hr at 01/07/21 1100  . TPN ADULT (ION)      PRN Meds: acetaminophen (TYLENOL) oral liquid 160 mg/5 mL, fentaNYL (SUBLIMAZE) injection, heparin, heparin, hydrALAZINE, sodium chloride, sodium chloride flush  Physical Exam Vitals and nursing note reviewed.  Constitutional:  Appearance: He is cachectic. He is ill-appearing.  Cardiovascular:     Rate and Rhythm: Normal rate.     Comments: NSR Pulmonary:     Effort: No tachypnea, accessory muscle usage or respiratory distress.     Comments: Room air Abdominal:     Comments: Midline incision C/D/I. Colostomy   Skin:    General: Skin is warm and dry.  Neurological:     Mental Status: He is alert, oriented to person, place, and time and easily aroused.     Comments: Drowsy, will answer simple questions            Vital Signs: BP (!) 184/69   Pulse 89   Temp 98 F (36.7 C) (Axillary)   Resp 20   Ht 6\' 2"  (1.88 m)   Wt 65.6 kg   SpO2 95%   BMI 18.57 kg/m  SpO2: SpO2: 95 % O2 Device: O2 Device: Room Air O2 Flow Rate: O2 Flow Rate (L/min): 2 L/min  Intake/output summary:   Intake/Output Summary (Last 24 hours) at 01/07/2021 1158 Last data filed at 01/07/2021 1100 Gross per 24 hour  Intake 3271.73 ml  Output 50 ml  Net 3221.73 ml   LBM: Last BM Date: 01/07/21 Baseline Weight: Weight: 61.2 kg Most recent weight: Weight: 65.6 kg       Palliative Assessment/Data: PPS 30%   Flowsheet Rows   Flowsheet Row Most Recent Value  Intake Tab   Referral Department Critical care  Unit at Time of Referral ICU  Palliative Care Primary Diagnosis Sepsis/Infectious Disease  Palliative Care Type Return patient Palliative Care  Reason for referral Clarify Goals of Care  Date first seen by Palliative Care 01/03/21   Clinical Assessment   Palliative Performance Scale Score 30%  Psychosocial & Spiritual Assessment   Palliative Care Outcomes   Patient/Family meeting held? Yes  Who was at the meeting? patient and daughter  Palliative Care Outcomes Clarified goals of care, Provided psychosocial or spiritual support, ACP counseling assistance      Patient Active Problem List   Diagnosis Date Noted  . Acute respiratory failure ([place])   . Palliative care by specialist   . Protein-calorie malnutrition, severe 01/02/2021  . Protein-calorie malnutrition ([place])   . Free intraperitoneal air 12/04/2020  . Perforated sigmoid colon ([place]) 12/12/2020  . Perforated bowel ([place]) 12/27/2020  . Problem with dialysis access ([place])   . Complication of vascular dialysis catheter   . Goals of care, counseling/discussion 11/29/2020  . Abdominal distension   . Colitis   . ESRD on hemodialysis ([place])   . Syncope due to orthostatic hypotension 11/02/2020  . Rectal bleeding   . Acute on chronic anemia   . Diarrhea   . Orthostatic hypotension 10/13/2020  . Chronic diarrhea 10/13/2020  . Colitis presumed infectious 10/13/2020  . Pressure injury of skin 10/13/2020  . ESRD on dialysis ([place]) 10/12/2020  . Unspecified atrial fibrillation ([place]) 10/12/2020  . Syncope 10/12/2020  . Small bowel obstruction ([place])   . Type 2 diabetes mellitus ([place]) 06/11/2018  . Peripheral neuropathy 06/11/2018  . Ankle syndesmosis disruption, right, initial encounter 06/11/2018  . AKI (acute kidney injury) ([place])   . Dehydration   . Open right ankle fracture 06/08/2018  . Fracture, ankle 06/08/2018  . Open displaced fracture of medial malleolus of tibia, type III 06/08/2018    Palliative Care Assessment & Plan   Patient Profile:  70 y.o. male  with past medical history of ESRD on hemodialysis, paroxysmal atrial fibrillation,  orthostatic hypotension, GI bleeding, DM type 2. Recent prolonged hospitalization 12/3-1/10 at [facility] with diarrhea,  dehydration, sacral decubitus/bed bound status. Admitted on 12/18/2020 with nausea/vomiting/abdominal pain. Found to have pneumoperitoneum. Underwent laparotomy 12/17/2020 which was negative. Found to be COVID positive. 1/30, LFT's increasing and KUB revealed worsening intraperitoneal air. Back to OR on 1/31 for exploratory lap with evidence of ischemic left colon, resulting in colostomy and left colectomy. Returned to ICU requiring ongoing intubation/mechanical ventilation, two vasopressors, and currently on CRRT. Severe protein calorie malnutrition on TPN. Palliative medicine consultation for goals of care.     2/4 Successfully extubated to nasal cannula. Levo off. Patient awake, alert, oriented and able to participate in [place] discussion.   2/7 Patient stable off ventilator. On room air. Off CRRT and will attempt iHD today. Trickle feeds at 20/hr. +BS and some liquid stool noted in colostomy.   Assessment: Pneumoperitoneum s/p ex-lap, left colectomy with colostomy Ischemic bowel Chronic stage IV sacral decubitus pressure ulcer Septic shock, resolved Chronic hypotension, autonomic dysfunction ESRD on hemodialysis Acute hypoxic respiratory failure, resolved PAF with RVR Delirium Anemia Thrombocytopenia Chronic debility/deconditioning Severe protein calorie malnutrition   Recommendations/Plan:  Initial GOC discussion with patient and daughter on 01/04/21.   Patient would like for daughter, [name] to be documented HCPOA. AD packet given for review. Spiritual care consult to assist with completion/notarization of this document. Daughter in contact with attorney to complete durable POA/living will.   Patient expresses wishes for NO CPR in the event of cardiac arrest. Partial code now. Otherwise, continue full scope treatment.   Patient would desire short-term re-intubation if necessary. He clearly tells daughter and I he would not wish for a trach if unable to survive off the ventilator.   2/7  PLAN: attempt iHD. Continue trickle feeds per surgery.   Ongoing palliative discussions pending clinical course. PMT provider will continue to follow.   Code Status: Partial   Code Status Orders  (From admission, onward)         Start     Ordered   01/04/21 1358  Limited resuscitation (code)  Continuous       Question Answer Comment  In the event of cardiac or respiratory ARREST: Initiate Code Blue, Call Rapid Response Yes   In the event of cardiac or respiratory ARREST: Perform CPR No   In the event of cardiac or respiratory ARREST: Perform Intubation/Mechanical Ventilation Yes   In the event of cardiac or respiratory ARREST: Use NIPPV/BiPAp only if indicated Yes   In the event of cardiac or respiratory ARREST: Administer ACLS medications if indicated Yes   In the event of cardiac or respiratory ARREST: Perform Defibrillation or Cardioversion if indicated Yes   Comments NO CPR per patient wishes      01/04/21 1357        Code Status History    Date Active Date Inactive Code Status Order ID Comments User Context   12/18/2020 0807 01/04/2021 1357 Full Code [id number]  Jacky Kindle, MD Inpatient   12/30/2020 0651 12/16/2020 0806 Full Code [id number]  Omar Person, NP Inpatient   11/02/2020 1521 12/11/2020 1526 Full Code [id number]  Rodena Goldmann, DO Inpatient   10/12/2020 2159 11/02/2020 0136 Full Code [id number]  Elgergawy, Silver Huguenin, MD ED   06/08/2018 2018 06/15/2018 2016 Full Code [id number]  Sherene Sires, DO Inpatient   06/08/2018 1524 06/08/2018 2017 Full Code [id number]  [facility] ED   Advance Care Planning Activity  Prognosis:  Poor prognosis  Discharge Planning:  To Be Determined  Care plan was discussed with Dr. Silas Flood, multidisciplinary rounds, patient, daughter Donella Stade), chaplain  Thank you for allowing the Palliative Medicine Team to assist in the care of this patient.   Total Time 20 Prolonged Time Billed  no      Greater than 50%  of  this time was spent counseling and coordinating care related to the above assessment and plan.   Ihor Dow, DNP, FNP-C Palliative Medicine Team  Phone: [phone] Fax: [phone]  Please contact Palliative Medicine Team phone at [phone] for questions and concerns.

## 2021-01-07 NOTE — Progress Notes (Signed)
Patient ID: [name], male   DOB: 02-26-1951, 70 y.o.   MRN: [id number] S: Pt seen in room, finishing up on HD O:BP (!) 100/36   Pulse (!) 103   Temp 97.8 F (36.6 C) (Axillary)   Resp (!) 22   Ht 6\' 2"  (1.88 m)   Wt 65.6 kg   SpO2 97%   BMI 18.57 kg/m   Intake/Output Summary (Last 24 hours) at 01/07/2021 1528 Last data filed at 01/07/2021 1500 Gross per 24 hour  Intake 2733.87 ml  Output 190 ml  Net 2543.87 ml   Intake/Output: I/O last 3 completed shifts: In: 4415.2 [I.V.:2862.6; Blood:325; NG/GT:700; IV Piggyback:527.6] Out: 386 [Drains:150; Other:236]  Intake/Output this shift:  Total I/O In: 967.9 [I.V.:675.8; NG/GT:290; IV Piggyback:2.1] Out: 140 [Drains:90; Stool:50] Weight change: 7.5 kg Gen: frail, chronically ill appearing cachectic man  CVS: rrr Resp: occ rhonchi Abd: +BS, colostomy in LLQ, bandage over incision RLQ Ext: 1-2+ UE > LE edema  Recent Labs  Lab 01/01/21 0254 01/02/21 0305 01/03/21 0301 01/03/21 1641 01/04/21 0304 01/04/21 1604 01/05/21 0516 01/05/21 1847 01/05/21 2027 01/06/21 0403 01/07/21 0333  NA 133* 131* 132* 133* 133* 136 134* 133* 136 136 133*  K 4.7 5.2* 4.0 3.9 3.8 3.7 3.3* 3.4* 3.5 3.3* 3.1*  CL 100 98 99 99 100 102 101 100  --  103 97*  CO2 14* 14* 17* 17* 18* 18* 19* 19*  --  22 20*  GLUCOSE 131* 216* 237* 269* 351* 196* 261* 298*  --  252* 197*  BUN 88* 104* 81* 69* 77* 68* 56* 48*  --  58* 93*  CREATININE 6.06* 6.45* 4.92* 3.88* 4.25* 3.38* 2.60* 2.11*  --  2.43* 3.07*  ALBUMIN 1.7* 1.7* 1.6* 1.8* 1.5* 1.7* 1.6* 1.6*  --  1.4* 1.5*  CALCIUM 7.3* 7.5* 7.1* 7.3* 7.1* 7.3* 7.3* 7.4*  --  7.2* 7.4*  PHOS 6.1* 7.2* 5.2* 4.0 3.6 2.2* 1.3* 1.4*  --  1.3* 2.4*  AST 103* 58* 33  --  40  --   --   --   --   --  47*  ALT 108* 86* 61*  --  50*  --   --   --   --   --  62*   Liver Function Tests: Recent Labs  Lab 01/03/21 0301 01/03/21 1641 01/04/21 0304 01/04/21 1604 01/05/21 1847 01/06/21 0403 01/07/21 0333  AST 33  --  40   --   --   --  47*  ALT 61*  --  50*  --   --   --  62*  ALKPHOS 99  --  98  --   --   --  108  BILITOT 0.9  --  2.2*  --   --   --  1.6*  PROT 4.3*  --  4.2*  --   --   --  4.3*  ALBUMIN 1.6*   < > 1.5*   < > 1.6* 1.4* 1.5*   < > = values in this interval not displayed.   No results for input(s): LIPASE, AMYLASE in the last 168 hours. No results for input(s): AMMONIA in the last 168 hours. CBC: Recent Labs  Lab 01/03/21 0439 01/03/21 1308 01/04/21 0304 01/04/21 0834 01/05/21 0827 01/05/21 2027 01/06/21 0836 01/06/21 1607 01/07/21 0333  WBC 11.6* 11.9* 12.2*  --  11.2*  --  8.6  --  13.2*  NEUTROABS 10.6*  --  11.0*  --   --   --   --   --  11.4*  HGB 6.3* 8.2* 6.8*   < > 8.2*   < > 6.6* 7.6* 8.4*  HCT 19.1* 24.5* 21.3*   < > 25.7*   < > 19.8* 23.7* 25.6*  MCV 95.5 93.2 95.5  --  92.8  --  93.0  --  92.1  PLT 36* 38* 32*  --  21*  --  43*  --  65*   < > = values in this interval not displayed.   Cardiac Enzymes: No results for input(s): CKTOTAL, CKMB, CKMBINDEX, TROPONINI in the last 168 hours. CBG: Recent Labs  Lab 01/06/21 1940 01/06/21 2357 01/07/21 0339 01/07/21 0821 01/07/21 1213  GLUCAP 270* 233* 185* 242* 254*    Iron Studies: No results for input(s): IRON, TIBC, TRANSFERRIN, FERRITIN in the last 72 hours. Studies/Results: CT HEAD WO CONTRAST  Result Date: 01/06/2021 CLINICAL DATA:  New onset delirium and confusion. EXAM: CT HEAD WITHOUT CONTRAST TECHNIQUE: Contiguous axial images were obtained from the base of the skull through the vertex without intravenous contrast. COMPARISON:  MRI 11/16/2020.  Head CT 01/25/2020. FINDINGS: Brain: The brainstem and cerebellum are normal. There is subacute infarction in the right posterior cerebral artery territory affecting the posteromedial temporal lobe and occipital lobe. Mild swelling but no evidence of mass effect or shift. No hemorrhage. Cerebral hemispheres otherwise show age related atrophy but no second insult. No  hydrocephalus or extra-axial collection Vascular: There is atherosclerotic calcification of the major vessels at the base of the brain. Skull: Normal Sinuses/Orbits: Chronic sinusitis with small air-fluid levels in the paranasal sinuses. Orbits negative. Other: None IMPRESSION: Subacute infarction in the right posterior cerebral artery territory affecting the posteromedial temporal lobe and occipital lobe. Mild swelling but no evidence of mass effect or shift. No hemorrhage. Electronically Signed   By: Nelson Chimes M.D.   On: 01/06/2021 00:27   . alteplase  2 mg Intracatheter Once  . vitamin C  500 mg Oral Daily  . chlorhexidine  15 mL Mouth Rinse BID  . Chlorhexidine Gluconate Cloth  6 each Topical Q0600  . collagenase   Topical Daily  . feeding supplement (VITAL 1.5 CAL)  1,000 mL Per Tube Q24H  . fludrocortisone  0.2 mg Oral Daily  . insulin aspart  0-20 Units Subcutaneous Q4H  . insulin glargine  20 Units Subcutaneous QHS  . mouth rinse  15 mL Mouth Rinse q12n4p  . midodrine  10 mg Oral TID WC  . mupirocin ointment   Nasal BID  . pantoprazole (PROTONIX) IV  40 mg Intravenous Q24H  . sodium chloride flush  10-40 mL Intracatheter Q12H  . zinc sulfate  220 mg Oral Daily    BMET    Component Value Date/Time   NA 133 (L) 01/07/2021 0333   K 3.1 (L) 01/07/2021 0333   CL 97 (L) 01/07/2021 0333   CO2 20 (L) 01/07/2021 0333   GLUCOSE 197 (H) 01/07/2021 0333   BUN 93 (H) 01/07/2021 0333   CREATININE 3.07 (H) 01/07/2021 0333   CALCIUM 7.4 (L) 01/07/2021 0333   GFRNONAA 21 (L) 01/07/2021 0333   GFRAA 19 (L) 06/15/2018 0407   CBC    Component Value Date/Time   WBC 13.2 (H) 01/07/2021 0333   RBC 2.78 (L) 01/07/2021 0333   HGB 8.4 (L) 01/07/2021 0333   HCT 25.6 (L) 01/07/2021 0333   PLT 65 (L) 01/07/2021 0333   MCV 92.1 01/07/2021 0333   MCH 30.2 01/07/2021 0333   MCHC 32.8 01/07/2021 0333  RDW 19.9 (H) 01/07/2021 0333   LYMPHSABS 0.9 01/07/2021 0333   MONOABS 0.8 01/07/2021 0333    EOSABS 0.1 01/07/2021 0333   BASOSABS 0.0 01/07/2021 0333    OP HD[facility] 66.5kg dry wt per OP HD RN/ R IJ TDC   Assessment/Plan: 1. Ischemic bowel withPneumoperitoneum/ shock -sp exlap, no perforated viscous found.repeatAbd films with worsening pneumoperitoneum ands/p left colectomy/colostomy 12/29/2020. 2. ESRD - on HD TTS.Not able to UF with IHD 1. Initiated CRRT 01/03/21 but had issues and really only had 2 good days of CRRT with UF of 4300 mls  2. CRRT stopped last night due to issues with filter and pt's agitation.   3. Will attempt IHD on Monday, however if he is unable to tolerate HD, will need to revisit transition to comfort care. 4. Continue with home dose of midodrine 3. Covid positive -off isolation 4. Orthostatic hypotension - chronic issue, pt is bed-bound it appears and does stretcher dialysis w/ the group in [facility]. On midodrine and fluorinef outpatient per charting.  5. BP /volume - has sig LE/ UE edema, but able to UF 4 liters with CRRT for 48 hours., no resp issues. Unable to UF large amts due to low alb/ 3rd spacing of fluid.  6. DM2 - per primary team  7. Anemia ckd - no emergent need for PRBC's. need records re: ESA dosing.  8. Hypocalcemia - repleted; added ca bath 9. Hypophosphatemia - will give IV phos again and follow.  10. Thrombocytopenia- transfuse as needed 11. Prognosis - very poorand has been seen by Palliative care in the pastand no evidence of improvement.He has severe autonomic dysfunction and is predominantly bed bound and can only get dialysis on a stretcher. Now with perforated colon s/p colectomy/colostomy, ongoing hypotension and hypoalbuminemia with poor chance of meaningful recovery or healing.Appreciate Palliative care assistance with goals of care and possible transition to comfort care.  Kelly Splinter, MD 01/07/2021, 3:29 PM

## 2021-01-07 NOTE — Progress Notes (Signed)
PHARMACY - TOTAL PARENTERAL NUTRITION CONSULT NOTE   Indication: Massive bowel resection and severe malnutrition  Patient Measurements: Height: 6\' 2"  (188 cm) Weight: 65.6 kg (144 lb 10 oz) IBW/kg (Calculated) : 82.2 TPN AdjBW (KG): 69.2 Body mass index is 18.57 kg/m.  Assessment: 55 yom male with diabetes, ESRDon HD, TTS,chronic diarrhea, chronic hypotension, PAF - systemic AC on hold w/hx of GIB, admitted 1/19 from SNF with N/V and abd pain, found to have pneumoperitoneum. Underwent laparotomy 1/20 without findings of bowel perforation.Incidentally COVID positive. He was transferred to [facility] on 1/22 and was doing better; however, LFTs elevated on 1/30, and KUB 1/31 showed worsening intraperitoneal air. Of note, previously had multiple ulcers in sigmoid colon of nonspecific etiology.He was taken back to OR 1/31 and underwent exploratory laparotomy with end colostomy and left colectomy, noting entire left colon was boggy, thought secondary to ischemia; also noted to have ulcerated area on anterior rectum. Pharmacy consulted for TPN. Noted, patient bed-bound at baseline.  Patient extubated 2/4. Low-dose norepinephrine off this AM. CRRT stopped overnight due to filter issues. Attempting IHD on 2/7 per Nephrology.  Glucose / Insulin: CBGs 185-270 on Lantus 20 units HS. Utilized 44 units (decreased and on steroids at time) resistant SSI in last 24hrs; 18 units since Lantus added. Stress steroids weaned off 2/6. Home Florinef resumed.  Electrolytes: K 3.1, Phos up 2.4 (s/p KPhos 30 mmol yesterday, no K in TPN, low Phos in TPN), Na 133/Cl 97, CO2 down 20, Mg 1.9, iCa low 1.08 on 2/6; others WNL Renal: ESRD on HD TTS PTA. CRRT 2/3>>2/7. To attempt IHD 2/7 LFTs / TGs: AST bumped 47, ALT trend up 62, TBili down 1.6, TG WNL Prealbumin / albumin: Prealbumin <5 >> 9.7, albumin 1.5 Intake / Output; MIVF: Drain output 50 ml/24hrs. LBM 2/6- colostomy. Net + 2.5L GI Imaging: 2/1 Abd Xray - Stable massive  pneumoperitoneum Surgeries / Procedures: 2/4 Cortrak placed  Central access: LIJ triple lumen CVC placed 01/01/21 TPN start date: 01/01/21  Nutritional Goals: (per RD recommendation on 2/4) Kcal:1900-2200kcal; Protein:140-170 g; Fluid:1000 mL plus UOP Goal TPN rate:  75 ml/hr with SMOF lipids 50g on MWF only - will provide a weekly average of 1940 kcal and 140g protein per day  Current Nutrition:  NPO  TPN - plan to start weaning 2/8 Vital 1.5 started 2/4 at 15ml/hr - attempting to advance to 30 mL/hr today  Plan:  Continue concentrated TPN at goal rate 57mL/hr at 1800 (recalculated with reduced dextrose content 2/6) Given national shortage of ILE, will provide lipids on MWF only Electrolytes in TPN: increase to 46mmol/L of Phos; otherwise continue same today - 16mEq/L of Na, 84mEq/L of K, 8mEq/L of Ca, 4 mEq/L of Mg. Max acetate Give KPhos 94mmol x 1 and KCl 40 mEq per tube x1 - as discussed with Nephrology  - Not adding further lytes to TPN as plan to wean tomorrow Add standard MVI and trace elements to TPN. Remove chromium with patient on RRT. Continue resistant SSI q4h (will not add insulin to TPN bag due to stress steroids stopped and dextrose content reduced in TPN as well as plan to wean TPN tomorrow). Lantus 20 units HS per CCM.  Monitor TPN labs, Nephrology plans, and [place] discussions F/u Surgery plans to advance TF and ability to wean TPN   Sloan Leiter, PharmD, BCPS, BCCCP Clinical Pharmacist Clinical phone 01/07/2021 [phone] Please refer to AMION for [place] numbers 01/07/2021 7:09 AM

## 2021-01-07 NOTE — Progress Notes (Signed)
eLink Physician-Brief Progress Note Patient Name: [name] DOB: [birth year] MRN: [id number]   Date of Service  01/07/2021  HPI/Events of Note  Nursing request for pain medication for dressing change/bath at 10 PM.  eICU Interventions  Plan: 1. Fentanyl 50 mcg IVP X 1 at 10 PM for dressing change/bath.      Intervention Category Major Interventions: Other:  Lysle Dingwall 01/07/2021, 8:05 PM

## 2021-01-07 NOTE — Progress Notes (Signed)
NAME:  [name], MRN:  [id number], DOB:  [birth year], LOS: 34 ADMISSION DATE:  12/10/2020, CONSULTATION DATE: 12/20/2020 REFERRING MD:  Jesusita Oka, MD, CHIEF COMPLAINT: Acute hypoxic respiratory failure post laparotomy  Brief History:  70 year old male with end-stage renal disease on dialysis TTS, admitted with nausea, vomiting, and abdominal pain, noted to have pneumoperitoneum.  Underwent laparotomy 1/20 which was negative.  Incidentally found to be COVID-positive. On 1/30, his LFTs started to elevate and KUB showed worsening intraperitoneal air.  He was taken back to OR 1/31 for ex lap with end colostomy and left colectomy.  Returns to ICU on two vasopressors and remains intubated secondary to hemodynamic instability.   History of Present Illness:  70 year old male with diabetes, ESRD on HD, TTS, chronic diarrhea, chronic hypotension, PAF - systemic AC on hold w/hx of GIB, admitted 1/19 from SNF with N/V and abd pain found to have pneumoperitoneum.   Prior to this he had prolonged hospitalization 12/3- 1/10  at [facility] with diarrhea, dehydration/ hypotension, deconditioning being bed bound with sacral decub, then previously 11/12- 12/2 with syncope and colitis.   Underwent laparotomy 1/20 without findings of bowel perforation.  Incidentally COVID positive.  He was transferred to [facility] on 1/22.  He was doing better however his LFTs elevated on 1/30 and KUB 1/31 showed worsening intraperitoneal air.  Of note he has previously had multiple ulcers in the sigmoid colon of nonspecific etiology.  He was taken back to the OR today, 1/31 and underwent exploratory laparotomy with end colostomy and left colectomy- noting that entire left colon was boggy thought secondary to ischemia; also noted to have ulcerated area on anterior rectum.  Post operative, he returns to ICU on two vasopressors and remains intubated due to hemodynamic instability. EBL 50 ml.  PCCM re-consulted for ICU management.   Past Medical  History:  End-stage renal disease on dialysis Diabetes type 2 Paroxysmal A. fib Orthostatic hypotension Unstageable sacral decubitus ulcer (POA)  Significant Hospital Events:  1/19>> Admit to [facility] for abdominal pain-pneumoperitoneum on imaging studies- 1/20 exploratory laparotomy negative/ extubated  1/21 iHD, low dose phenyl needed, weaned off after iHD stopped, started on stress dose steroids - got 1 dose and was stopped, resumed home midodrine 1/22>> transferred to [facility] service. 1/26>> plans to discharge back to SNF-however upon further evaluation-needed inpatient hydrotherapy for worsening sacral wound. 1/27>> general surgery consulted for sacral wound-continue hydrotherapy for few more days. 1/31 Back to the OR for Pneumoperitoneum ( Exp. Lap Left colectomy, end colectomy), to ICU for shock post op, remains on vent  Consults:  [facility] Nephrology   Procedures:  1/10 right subclavian DL TDC >>  1/20 ex lap >> neg 1/31 ex lap >> end colostomy/ left colectomy   ETT 1/20; 1/31 >> Left radial aline 1/31 >>  Significant Diagnostic Tests:  1/20 CT abdomen and pelvis: There is a focal area of wall thickening with adjacent perforation of the distal descending colon at the junction of the sigmoid colon, likely the transition point. 2. There is moderately dilated sigmoid colon and rectum which is stool-filled and suggestion of possible pneumatosis. 3. Free air seen under the right hemidiaphragm and within the porta hepatis. 4. Small amount of air seen within the inferior right liver lobe which is concerning for portal venous gas given the patient's findings 5. Bilateral sacral decubitus ulceration with phlegmon and subcutaneous emphysema extending to the inferior coccyx.  1/31 KUB >> Massive pneumoperitoneum again noted. Note that this typically more than  would be expected given laparotomy on 12/29/2020 and an ongoing air leak secondary to visceral perforation should be considered.   Repeat CT imaging with oral contrast may be helpful to identify the source of leak.  Suspected long segment colitis involving the distal descending and sigmoid colon.  2/1 KUB>> stable massive pneumoperitoneum; gastric tube in stomach   Micro Data:  1/20 influenza PCR negative 1/20 COVID PCR positive 1/21 MRSA PCR >> positive  1/31 BCx2 >> NGTD  Antimicrobials:  Zosyn 1/19 > 1/24; 1/31 >> 2/4  Interim History / Subjective:  Off CRRT. Bps a bit better. Awaiting iHD trial.   Objective   Blood pressure (!) 184/69, pulse 89, temperature 98 F (36.7 C), temperature source Axillary, resp. rate 20, height 6\' 2"  (1.88 m), weight 65.6 kg, SpO2 95 %.        Intake/Output Summary (Last 24 hours) at 01/07/2021 1205 Last data filed at 01/07/2021 1100 Gross per 24 hour  Intake 3271.73 ml  Output 50 ml  Net 3221.73 ml   Filed Weights   01/05/21 0500 01/06/21 0500 01/07/21 0339  Weight: 68 kg 58.1 kg 65.6 kg   Physical Exam: General: Chronically ill-appearing, cachectic, thin malnourished, no acute distress HENT: [place], AT, on [place] Eyes: EOMI, no scleral icterus Respiratory: Clear to auscultation bilaterally.  No crackles, wheezing or rales Cardiovascular: RRR, -M/R/G, no JVD GI: BS+, open abdomen, left lower quadrant ostomy Extremities: 1+ pitting edema in upper and lower extremities Neuro: Awake and alert, follows commands   Resolved Hospital Problem list   Large bowel obstruction status post exploratory laparotomy Acute metabolic acidosis COVID 19 infection- off isolation   Assessment & Plan:  Pneumoperitoneum s/p ex-lap resulting in left colectomy with end colostomy  Ischemic bowel - s/p end colostomy and left colectomy 1/31, evidence of ischemic left colon - Management per CCS - no role for additional surgery -TPN per pharmacy started 2/1  - trickle TF 2/4, advance slowly starting 2/7  Hypotension, Septic shock, hx orthostatic: multifactorial- poor nutrition, chronic hypotension,  AI, sedation, rule out intra-abdominal infection given microperforation/ pneumoperitoneum, sacral ulcer - wean stress dose steroids to end 2/6 - Resume home florinef 2/7 - trend CBC - follow blood cultures, ngtd - Zosyn for 5 days(ended 2/4) - Home midodrine 10 mg TID - SBP goal > 100 - titrating pressors  Acute hypoxic respiratory insufficiency post-operatively, previously due to volume overload from missing HD session Open abdomen - Extubated 2/4 - volume removal via dialysis  ESRD on TTS (new to HD since 10/2020) - Dialysis per nephrology - trend renal indices   Delirium: Encephalopathic overnight 2/5, improved on precedex. --precedex, wean as able  Diabetes type 2: Worsened hyperglycemia with stress dose steroids - SSI, lantus 20 u qhs  PAF w/RVR - remains in NSR - PO amiodarone held - hold betablocker - no systemic AC given prior GIB  Sacral wound- un-stageable, present on admission - per WOC/ surgery recommendations, hydrotherapy as tolerated  - no role for OR per surgery  Anemia- multifactorial, of chronic illness, ABLA - transfused 1 unit 1/29 , 1/30, 2/3 - remains on PPI BID - trend CBC - transfuse for Hgb < 7  Thrombocytopenia - suspect sepsis -  Platelets trend stable, continue to monitor/ trend  -  Transfuse for goal plt >10k  Chronic debility/ deconditioning -  mostly bed bound now, PT/ OT  Protein calorie malnutrition>> severe - TPN per pharmacy  - TF trickle 2/4, increase 2/7  Best practice (evaluated daily)  Diet: NPO, TPN, TF Pain/Anxiety/Delirium protocol (if indicated): n/a VAP protocol (if indicated): n/a DVT prophylaxis: SCDs only  GI prophylaxis: PPI  Glucose control: SSI sensitive Mobility: PT/OT Disposition: ICU  Goals of Care:   Code Status: Limitations Poor prognosis, Goals of care discussion with patient and palliative care 2/4 limits to no CPR, otherwise would want everything else.  Labs   CBC: Recent Labs  Lab  01/01/21 0254 01/01/21 1730 01/02/21 0305 01/03/21 0439 01/03/21 1308 01/04/21 0304 01/04/21 1517 01/05/21 0827 01/05/21 2027 01/06/21 0836 01/06/21 1607 01/07/21 0333  WBC 10.3  --  10.9* 11.6* 11.9* 12.2*  --  11.2*  --  8.6  --  13.2*  NEUTROABS 9.5*  --  9.9* 10.6*  --  11.0*  --   --   --   --   --  11.4*  HGB 7.8*  --  7.7* 6.3* 8.2* 6.8*   < > 8.2* 8.5* 6.6* 7.6* 8.4*  HCT 23.6*  --  24.0* 19.1* 24.5* 21.3*   < > 25.7* 25.0* 19.8* 23.7* 25.6*  MCV 92.2  --  94.9 95.5 93.2 95.5  --  92.8  --  93.0  --  92.1  PLT 44*   < > 49* 36* 38* 32*  --  21*  --  43*  --  65*   < > = values in this interval not displayed.    Basic Metabolic Panel: Recent Labs  Lab 01/03/21 0301 01/03/21 1641 01/04/21 0304 01/04/21 1604 01/05/21 0516 01/05/21 1847 01/05/21 2027 01/06/21 0403 01/07/21 0333  NA 132*   < > 133* 136 134* 133* 136 136 133*  K 4.0   < > 3.8 3.7 3.3* 3.4* 3.5 3.3* 3.1*  CL 99   < > 100 102 101 100  --  103 97*  CO2 17*   < > 18* 18* 19* 19*  --  22 20*  GLUCOSE 237*   < > 351* 196* 261* 298*  --  252* 197*  BUN 81*   < > 77* 68* 56* 48*  --  58* 93*  CREATININE 4.92*   < > 4.25* 3.38* 2.60* 2.11*  --  2.43* 3.07*  CALCIUM 7.1*   < > 7.1* 7.3* 7.3* 7.4*  --  7.2* 7.4*  MG 1.8  --  1.8  --  2.0  --   --  2.0 1.9  PHOS 5.2*   < > 3.6 2.2* 1.3* 1.4*  --  1.3* 2.4*   < > = values in this interval not displayed.   GFR: Estimated Creatinine Clearance: 20.8 mL/min (A) (by C-G formula based on SCr of 3.07 mg/dL (H)). Recent Labs  Lab 12/07/2020 1724 01/01/21 0254 01/04/21 0304 01/05/21 0827 01/06/21 0836 01/07/21 0333  WBC 8.4   < > 12.2* 11.2* 8.6 13.2*  LATICACIDVEN 1.5  --   --   --   --   --    < > = values in this interval not displayed.    Liver Function Tests: Recent Labs  Lab 01/01/21 0254 01/02/21 0305 01/03/21 0301 01/03/21 1641 01/04/21 0304 01/04/21 1604 01/05/21 0516 01/05/21 1847 01/06/21 0403 01/07/21 0333  AST 103* 58* 33  --  40  --    --   --   --  47*  ALT 108* 86* 61*  --  50*  --   --   --   --  62*  ALKPHOS 107 120 99  --  98  --   --   --   --  108  BILITOT 1.3* 1.2 0.9  --  2.2*  --   --   --   --  1.6*  PROT 4.5* 4.6* 4.3*  --  4.2*  --   --   --   --  4.3*  ALBUMIN 1.7* 1.7* 1.6*   < > 1.5* 1.7* 1.6* 1.6* 1.4* 1.5*   < > = values in this interval not displayed.   No results for input(s): LIPASE, AMYLASE in the last 168 hours. No results for input(s): AMMONIA in the last 168 hours.  ABG    Component Value Date/Time   PHART 7.546 (H) 01/05/2021 2027   PCO2ART 25.5 (L) 01/05/2021 2027   PO2ART 68 (L) 01/05/2021 2027   HCO3 22.1 01/05/2021 2027   TCO2 23 01/05/2021 2027   ACIDBASEDEF 5.0 (H) 12/10/2020 1751   O2SAT 96.0 01/05/2021 2027     Coagulation Profile: Recent Labs  Lab 01/01/21 1730 01/02/21 0305 01/03/21 0301 01/04/21 0304 01/05/21 0516  INR 1.1 1.1 1.2 1.3* 1.2    Cardiac Enzymes: No results for input(s): CKTOTAL, CKMB, CKMBINDEX, TROPONINI in the last 168 hours.  HbA1C: Hgb A1c MFr Bld  Date/Time Value Ref Range Status  12/21/2020 12:08 PM 4.1 (L) 4.8 - 5.6 % Final    Comment:    (NOTE) Pre diabetes:          5.7%-6.4%  Diabetes:              >6.4%  Glycemic control for   <7.0% adults with diabetes   06/13/2018 03:16 AM 6.6 (H) 4.8 - 5.6 % Final    Comment:    (NOTE) Pre diabetes:          5.7%-6.4% Diabetes:              >6.4% Glycemic control for   <7.0% adults with diabetes     CBG: Recent Labs  Lab 01/06/21 1552 01/06/21 1940 01/06/21 2357 01/07/21 0339 01/07/21 0821  GLUCAP 267* 270* 233* 185* 242*    CRITICAL CARE Performed by: Bonna Gains Clydene Burack   Total critical care time: 32 minutes  Critical care time was exclusive of separately billable procedures and treating other patients.  Critical care was necessary to treat or prevent imminent or life-threatening deterioration.  Critical care was time spent personally by me on the following activities:  development of treatment plan with patient and/or surrogate as well as nursing, discussions with consultants, evaluation of patient's response to treatment, examination of patient, obtaining history from patient or surrogate, ordering and performing treatments and interventions, ordering and review of laboratory studies, ordering and review of radiographic studies, pulse oximetry and re-evaluation of patient's condition.    Lanier Clam, MD

## 2021-01-07 NOTE — Progress Notes (Signed)
Physical Therapy Wound Treatment Patient Details  Name: MAHMOUD BLAZEJEWSKI MRN: [id number] Date of Birth: [birth year]  Today's Date: 01/07/2021 Time: 0924-1010 Time Calculation (min): 46 min  Subjective  Subjective: Pt reporting bottom hurt during physical therapy prior Patient and Family Stated Goals: heal wound Date of Onset:  (unknown - pt reports at least a month) Prior Treatments: unknown  Pain Score: 6/10  Wound Assessment  Pressure Injury 12/05/20 Coccyx Medial;Upper Unstageable - Full thickness tissue loss in which the base of the injury is covered by slough (yellow, tan, gray, green or brown) and/or eschar (tan, brown or black) in the wound bed. prevous stage 2 on sacrum (Active)  Dressing Type ABD;Barrier Film (skin prep);Gauze (Comment);Moist to moist 01/07/21 1247  Dressing Changed;Clean;Dry;Intact 01/07/21 1247  Dressing Change Frequency Twice a day 01/07/21 1247  State of Healing Early/partial granulation 01/07/21 1247  Site / Wound Assessment Bleeding;Red;Pink;Black;Yellow 01/07/21 1247  % Wound base Red or Granulating 55% 01/05/21 0930  % Wound base Yellow/Fibrinous Exudate 30% 01/05/21 0930  % Wound base Black/Eschar 15% 01/05/21 0930  % Wound base Other/Granulation Tissue (Comment) 0% 01/05/21 0930  Peri-wound Assessment Erythema (blanchable);Purple;Black 01/07/21 1247  Wound Length (cm) 10 cm 01/02/21 1220  Wound Width (cm) 8 cm 01/02/21 1220  Wound Depth (cm) 1 cm 01/02/21 1220  Wound Surface Area (cm^2) 80 cm^2 01/02/21 1220  Wound Volume (cm^3) 80 cm^3 01/02/21 1220  Tunneling (cm) 6 cm at 9:00 01/07/21 1247  Undermining (cm) 4+ cm at 7-12 oclock, 2 cm at 12 to 6 oclock 01/04/21 1355  Margins Attached edges (approximated) 01/06/21 2000  Drainage Amount Minimal 01/07/21 1247  Drainage Description Serosanguineous;Purulent 01/07/21 1247  Treatment Debridement (Selective);Hydrotherapy (Pulse lavage);Packing (Saline gauze) 01/07/21 1247   Santyl applied to wound bed prior  to applying dressing.    Hydrotherapy Pulsed lavage therapy - wound location: sacrum Pulsed Lavage with Suction (psi): 12 psi (4-12) Pulsed Lavage with Suction - Normal Saline Used: 1000 mL Pulsed Lavage Tip: Tip with splash shield Selective Debridement Selective Debridement - Location: sacrum Selective Debridement - Tools Used: Scalpel;Forceps Selective Debridement - Tissue Removed: eschar, brown slough, black necrotic edges   Wound Assessment and Plan  Wound Therapy - Assess/Plan/Recommendations Wound Therapy - Clinical Statement: Decreased drainage and odor today. Xeroform placed on periwound at 9:00 over area breaking down. Will continue hydro and selective debridement to both clean up necrosis and decrease the bioburden. Wound Therapy - Functional Problem List: global weakness and immobility Factors Delaying/Impairing Wound Healing: Immobility;Multiple medical problems Hydrotherapy Plan: Debridement;Dressing change;Patient/family education;Pulsatile lavage with suction Wound Therapy - Frequency: 6X / week Wound Therapy - Follow Up Recommendations: Skilled nursing facility Wound Plan: [name] d/c'd, but discharged held to continue debridement and management of notable infection.  Wound Therapy Goals- Improve the function of patient's integumentary system by progressing the wound(s) through the phases of wound healing (inflammation - proliferation - remodeling) by: Decrease Necrotic Tissue to: 40 Decrease Necrotic Tissue - Progress: Progressing toward goal Increase Granulation Tissue to: 60 Increase Granulation Tissue - Progress: Progressing toward goal Goals/treatment plan/discharge plan were made with and agreed upon by patient/family: Yes Time For Goal Achievement: 7 days Wound Therapy - Potential for Goals: Good  Goals will be updated until maximal potential achieved or discharge criteria met.  Discharge criteria: when goals achieved, discharge from hospital, MD  decision/surgical intervention, no progress towards goals, refusal/missing three consecutive treatments without notification or medical reason.  GP  Wyona Almas, PT, DPT Acute Rehabilitation Services Pager [phone] Office  [place] 01/07/2021, 12:52 PM

## 2021-01-08 DIAGNOSIS — J9601 Acute respiratory failure with hypoxia: Secondary | ICD-10-CM | POA: Diagnosis not present

## 2021-01-08 LAB — RENAL FUNCTION PANEL
Albumin: 1.6 g/dL — ABNORMAL LOW (ref 3.5–5.0)
Anion gap: 15 (ref 5–15)
BUN: 78 mg/dL — ABNORMAL HIGH (ref 8–23)
CO2: 20 mmol/L — ABNORMAL LOW (ref 22–32)
Calcium: 7.3 mg/dL — ABNORMAL LOW (ref 8.9–10.3)
Chloride: 102 mmol/L (ref 98–111)
Creatinine, Ser: 2.68 mg/dL — ABNORMAL HIGH (ref 0.61–1.24)
GFR, Estimated: 25 mL/min — ABNORMAL LOW (ref >60)
Glucose, Bld: 187 mg/dL — ABNORMAL HIGH (ref 70–99)
Phosphorus: 1.7 mg/dL — ABNORMAL LOW (ref 2.5–4.6)
Potassium: 3.4 mmol/L — ABNORMAL LOW (ref 3.5–5.1)
Sodium: 137 mmol/L (ref 135–145)

## 2021-01-08 LAB — GLUCOSE, CAPILLARY
Glucose-Capillary: 146 mg/dL — ABNORMAL HIGH (ref 70–99)
Glucose-Capillary: 184 mg/dL — ABNORMAL HIGH (ref 70–99)
Glucose-Capillary: 194 mg/dL — ABNORMAL HIGH (ref 70–99)
Glucose-Capillary: 225 mg/dL — ABNORMAL HIGH (ref 70–99)
Glucose-Capillary: 229 mg/dL — ABNORMAL HIGH (ref 70–99)
Glucose-Capillary: 249 mg/dL — ABNORMAL HIGH (ref 70–99)
Glucose-Capillary: 57 mg/dL — ABNORMAL LOW (ref 70–99)
Glucose-Capillary: 93 mg/dL (ref 70–99)

## 2021-01-08 LAB — MAGNESIUM: Magnesium: 1.9 mg/dL (ref 1.7–2.4)

## 2021-01-08 MED ORDER — ACETAMINOPHEN 160 MG/5ML PO SOLN: 650.0000 mg | ORAL | Status: DC | PRN

## 2021-01-08 MED ORDER — POTASSIUM PHOSPHATES 15 MMOLE/5ML IV SOLN
15.0000 mmol | Freq: Once | INTRAVENOUS | Status: AC
  Administered 2021-01-08: 15 mmol via INTRAVENOUS
  Filled 2021-01-08: qty 5

## 2021-01-08 MED ORDER — TRACE MINERALS CU-MN-SE-ZN 300-55-60-3000 MCG/ML IV SOLN
INTRAVENOUS | Status: DC
  Filled 2021-01-08: qty 461.73

## 2021-01-08 MED ORDER — OXYCODONE HCL 5 MG PO TABS
5.0000 mg | ORAL_TABLET | Freq: Four times a day (QID) | ORAL | Status: DC | PRN
  Administered 2021-01-08: 5 mg via ORAL
  Filled 2021-01-08: qty 1

## 2021-01-08 MED ORDER — NOREPINEPHRINE 4 MG/250ML-% IV SOLN
0.0000 ug/min | INTRAVENOUS | Status: DC
  Administered 2021-01-08: 5 ug/min via INTRAVENOUS
  Administered 2021-01-09: 4 ug/min via INTRAVENOUS
  Filled 2021-01-08: qty 250

## 2021-01-08 MED ORDER — VITAL 1.5 CAL PO LIQD
1000.0000 mL | ORAL | Status: DC
  Administered 2021-01-08: 1000 mL
  Filled 2021-01-08: qty 1000

## 2021-01-08 MED ORDER — FENTANYL CITRATE (PF) 100 MCG/2ML IJ SOLN
50.0000 ug | INTRAMUSCULAR | Status: DC | PRN
Start: 2021-01-08 — End: 2021-01-09
  Administered 2021-01-08 – 2021-01-09 (×3): 50 ug via INTRAVENOUS
  Filled 2021-01-08 (×3): qty 2

## 2021-01-08 MED ORDER — NOREPINEPHRINE 4 MG/250ML-% IV SOLN
INTRAVENOUS | Status: AC
  Filled 2021-01-08: qty 250

## 2021-01-08 MED ORDER — PROSOURCE TF PO LIQD
90.0000 mL | Freq: Three times a day (TID) | ORAL | Status: DC
  Administered 2021-01-08 (×3): 90 mL
  Filled 2021-01-08 (×3): qty 90

## 2021-01-08 MED ORDER — DEXTROSE 50 % IV SOLN
12.5000 g | INTRAVENOUS | Status: AC
  Administered 2021-01-08: 12.5 g via INTRAVENOUS
  Filled 2021-01-08: qty 50

## 2021-01-08 MED ORDER — ONDANSETRON HCL 4 MG/2ML IJ SOLN
4.0000 mg | Freq: Four times a day (QID) | INTRAMUSCULAR | Status: DC | PRN
  Administered 2021-01-09: 4 mg via INTRAVENOUS
  Filled 2021-01-08: qty 2

## 2021-01-08 MED ORDER — ONDANSETRON HCL 4 MG/2ML IJ SOLN
INTRAMUSCULAR | Status: AC
  Administered 2021-01-08: 4 mg via INTRAVENOUS
  Filled 2021-01-08: qty 2

## 2021-01-08 NOTE — Progress Notes (Signed)
Nutrition Follow-up   DOCUMENTATION CODES:   Severe malnutrition in context of chronic illness,Underweight  INTERVENTION:    TF via Cortrak tube:  Vital 1.5 at 30 ml/h. Increase by 10 ml every 8 hours to goal rate of 55 ml/h with Prosource TF 90 ml TID to provide 2220 kcal, 155 gm protein, 1008 ml free water daily  Wean TPN per Pharmacy  Continue Vitamin C 500 mg daily for repletion and to support wound healing  Continue Zinc 220 mg daily for repletion and to support wound healing  NUTRITION DIAGNOSIS:   Severe Malnutrition related to chronic illness (ESRD on HD) as evidenced by severe muscle depletion,severe fat depletion.  Ongoing   GOAL:   Patient will meet greater than or equal to 90% of their needs  Met  MONITOR:   Vent status,Labs,Skin,I & O's  REASON FOR ASSESSMENT:   Consult Enteral/tube feeding initiation and management (slowly advance TF to goal)  ASSESSMENT:   70 yo male admitted with with pneumoperitoneum, shock, acute metabolic acidosis, respiratory failure due to volume overload- ESRD/HD (started 10/2020), Pt missed HD x 2 prior to admission.Pt also COVID+.  PMH includes DM, unstageable sacral PI  Discussed patient in ICU rounds and with RN today. Extubated 2/4. Palliative care team following.  Trickle tube feeding initiated on 2/4 with Vital 1.5 at 20 ml/h; increased to 30 ml/h yesterday evening (2/7). Patient vomited this morning during hydrotherapy, may have been related to positioning. Plans to increase TF slowly to goal rate today.   CRRT off. Received iHD 2/7.  Receiving hydrotherapy to sacral wound daily.   Receiving TPN at 75 ml/h with SMOF lipids 3 times per week to provide a daily average of 1940 kcal and 140 gm protein. Plans to begin weaning TPN today as TF is advanced. TPN rate to be halved with today's bag.   Labs reviewed. K 3.4, BUN 78, Creat 2.68, phos 1.7 CBG: [phone]  Medications reviewed and include potassium  phosphate, vitamin C, novolog, lantus, zinc sulfate.  Admission weight 61.2 kg, current weight 60.5 kg I/O +5.3 L Colostomy: 600 ml x 24 hours JP drain: 235 ml output x 24 hours UOP: 0 ml x 24 hours  Diet Order:   Diet Order            Diet NPO time specified Except for: Sips with Meds  Diet effective now                 EDUCATION NEEDS:   Not appropriate for education at this time  Skin:  Skin Assessment: Skin Integrity Issues: Skin Integrity Issues:: Other (Comment),Incisions Unstageable: sacrum Incisions: abdomen Other: L great toe ulcer  Last BM:  2/8 Colostomy; 600 ml via colostomy x 24 hours  Height:   Ht Readings from Last 1 Encounters:  01/01/21 6' 2" (1.88 m)    Weight:   Wt Readings from Last 1 Encounters:  01/08/21 60.5 kg    Ideal Body Weight:  86.4 kg  BMI:  Body mass index is 17.12 kg/m.  Estimated Nutritional Needs:   Kcal:  2100-2300  Protein:  105-120 gm  Fluid:  1000 mL plus UOP   Lucas Mallow, RD, LDN, CNSC Please refer to Amion for contact information.

## 2021-01-08 NOTE — Progress Notes (Signed)
OT Cancellation Note  Patient Details Name: [name] MRN: [id number] DOB: [birth year]   Cancelled Treatment:    Reason Eval/Treat Not Completed: Patient declined, no reason specified (Pt reports "I'm just now comfortable. Please I don't want to do anything.")   [name], OTR/L Acute Rehabilitation Services Pager: [phone] Office: [phone]   [name] C 01/08/2021, 2:06 PM

## 2021-01-08 NOTE — Progress Notes (Signed)
Physical Therapy Wound Treatment Patient Details  Name: [name] MRN: [id number] Date of Birth: 11-26-1951  Today's Date: 01/08/2021 Time: 0910-1003 Time Calculation (min): 53 min  Subjective  Subjective: Pt alert, agreeable Patient and Family Stated Goals: heal wound Date of Onset:  (unknown - pt reports at least a month) Prior Treatments: unknown  Pain Score:  8/10  Wound Assessment  Pressure Injury 12/05/20 Coccyx Medial;Upper Unstageable - Full thickness tissue loss in which the base of the injury is covered by slough (yellow, tan, gray, green or brown) and/or eschar (tan, brown or black) in the wound bed. prevous stage 2 on sacrum (Active)  Wound Image   01/08/21 1056  Dressing Type ABD;Barrier Film (skin prep);Gauze (Comment);Moist to dry;Santyl 01/08/21 1056  Dressing Changed;Dry;Clean;Intact 01/08/21 1056  Dressing Change Frequency Twice a day 01/08/21 1056  State of Healing Early/partial granulation 01/08/21 1056  Site / Wound Assessment Pink;Red;Black;Yellow;Painful 01/08/21 1056  % Wound base Red or Granulating 55% 01/05/21 0930  % Wound base Yellow/Fibrinous Exudate 30% 01/05/21 0930  % Wound base Black/Eschar 15% 01/05/21 0930  % Wound base Other/Granulation Tissue (Comment) 0% 01/05/21 0930  Peri-wound Assessment Erythema (blanchable) 01/08/21 1056  Wound Length (cm) 13.5 cm 01/08/21 1056  Wound Width (cm) 10 cm 01/08/21 1056  Wound Depth (cm) 2 cm 01/08/21 1056  Wound Surface Area (cm^2) 135 cm^2 01/08/21 1056  Wound Volume (cm^3) 270 cm^3 01/08/21 1056  Tunneling (cm) 6 cm at 9:00 01/07/21 1247  Undermining (cm) 4-6 cm at 7-11 o clock, 2 cm at 12-6 o clock 01/08/21 1056  Margins Unattached edges (unapproximated) 01/08/21 1056  Drainage Amount Moderate 01/08/21 1056  Drainage Description Serosanguineous;Purulent 01/08/21 1056  Treatment Debridement (Selective);Hydrotherapy (Pulse lavage);Packing (Saline gauze) 01/08/21 1056     Hydrotherapy Pulsed lavage  therapy - wound location: sacrum Pulsed Lavage with Suction (psi): 12 psi (4-12) Pulsed Lavage with Suction - Normal Saline Used: 1000 mL Pulsed Lavage Tip: Tip with splash shield Selective Debridement Selective Debridement - Location: sacrum Selective Debridement - Tools Used: Scalpel;Forceps Selective Debridement - Tissue Removed: eschar, brown slough, black necrotic edges   Wound Assessment and Plan  Wound Therapy - Assess/Plan/Recommendations Wound Therapy - Clinical Statement: Continued purulent drainage from pocket from 7-11 o clock with mild foul odor. Pt with tenderness/pain with sharp debridement at base of wound. Xeroform placed on periwound at 9:00 over area breaking down. Will continue hydro and selective debridement to both clean up necrosis and decrease the bioburden. Wound Therapy - Functional Problem List: global weakness and immobility Factors Delaying/Impairing Wound Healing: Immobility;Multiple medical problems Hydrotherapy Plan: Debridement;Dressing change;Patient/family education;Pulsatile lavage with suction Wound Therapy - Frequency: 6X / week Wound Therapy - Follow Up Recommendations: Skilled nursing facility Wound Plan: [name] d/c'd, but discharged held to continue debridement and management of notable infection.  Wound Therapy Goals- Improve the function of patient's integumentary system by progressing the wound(s) through the phases of wound healing (inflammation - proliferation - remodeling) by: Decrease Necrotic Tissue to: 40 Decrease Necrotic Tissue - Progress: Progressing toward goal Increase Granulation Tissue to: 60 Increase Granulation Tissue - Progress: Progressing toward goal Goals/treatment plan/discharge plan were made with and agreed upon by patient/family: Yes Time For Goal Achievement: 7 days Wound Therapy - Potential for Goals: Good  Goals will be updated until maximal potential achieved or discharge criteria met.  Discharge criteria: when goals  achieved, discharge from hospital, MD decision/surgical intervention, no progress towards goals, refusal/missing three consecutive treatments without notification or medical reason.  GP  Wyona Almas, PT, DPT Acute Rehabilitation Services Pager [phone] Office [phone]   [name] 01/08/2021, 11:01 AM

## 2021-01-08 NOTE — Progress Notes (Addendum)
PHARMACY - TOTAL PARENTERAL NUTRITION CONSULT NOTE   Indication: Massive bowel resection and severe malnutrition  Patient Measurements: Height: 6\' 2"  (188 cm) Weight: 60.5 kg (133 lb 6.1 oz) IBW/kg (Calculated) : 82.2 TPN AdjBW (KG): 69.2 Body mass index is 17.12 kg/m.  Assessment: 14 yom male with diabetes, ESRDon HD, TTS,chronic diarrhea, chronic hypotension, PAF - systemic AC on hold w/hx of GIB, admitted 1/19 from SNF with N/V and abd pain, found to have pneumoperitoneum. Underwent laparotomy 1/20 without findings of bowel perforation.Incidentally COVID positive. He was transferred to [facility] on 1/22 and was doing better; however, LFTs elevated on 1/30, and KUB 1/31 showed worsening intraperitoneal air. Of note, previously had multiple ulcers in sigmoid colon of nonspecific etiology.He was taken back to OR 1/31 and underwent exploratory laparotomy with end colostomy and left colectomy, noting entire left colon was boggy, thought secondary to ischemia; also noted to have ulcerated area on anterior rectum. Pharmacy consulted for TPN. Noted, patient bed-bound at baseline.  Patient extubated 2/4. Low-dose norepinephrine off this AM. CRRT stopped overnight due to filter issues. Attempting IHD on 2/7 per Nephrology.  Glucose / Insulin: CBGs 185-270 on Lantus 20 units HS. Utilized 44 units (decreased and on steroids at time) resistant SSI in last 24hrs; 18 units since Lantus added. Stress steroids weaned off 2/6. Home Florinef resumed.  Electrolytes: K 3.4, Phos down 1.7 (s/p KPhos 10 mmol yesterday, no K in TPN, low Phos in TPN), Na 133/Cl 97, CO2 down 20, Mg 1.9, iCa low 1.08 on 2/6; others WNL Renal: ESRD on HD TTS PTA. CRRT 2/3>>2/7. To attempt IHD 2/7 LFTs / TGs: AST bumped 47, ALT trend up 62, TBili down 1.6, TG WNL Prealbumin / albumin: Prealbumin <5 >> 9.7, albumin 1.5 Intake / Output; MIVF: Drain output 50 ml/24hrs. Colostomy output - 632mL. Net + 3.1L GI Imaging: 2/1 Abd Xray - Stable  massive pneumoperitoneum Surgeries / Procedures: 2/4 Cortrak placed  Central access: LIJ triple lumen CVC placed 01/01/21 TPN start date: 01/01/21  Nutritional Goals: (per RD recommendation on 2/4) Kcal:1900-2200kcal; Protein:140-170 g; Fluid:1000 mL plus UOP Goal TPN rate:  75 ml/hr with SMOF lipids 50g on MWF only - will provide a weekly average of 1940 kcal and 140g protein per day  Current Nutrition:  NPO  TPN - plan to start weaning 2/8 Vital 1.5 started 2/4 at 32ml/hr - advancing further today  Plan:  Reduce concentrated TPN at HALF rate 37 mL/hr at 1800 Given national shortage of ILE, will provide lipids on MWF only - No lipid today Electrolytes in TPN: 10 mmol/L of Phos (~88mmol of TPN -same as prior day); otherwise continue same today - 82mEq/L of Na, 91mEq/L of K, 7mEq/L of Ca, 4 mEq/L of Mg. Max acetate Give KPhos 22mmol x 1 - as discussed with Nephrology  - Not adding further lytes to TPN as plan to wean off Add standard MVI and trace elements to TPN. Remove chromium with patient on RRT. Continue resistant SSI q4h (will not add insulin to TPN bag due to stress steroids stopped and dextrose content reduced in TPN as well as plan to wean TPN tomorrow). Lantus 20 units HS per CCM.  Monitor TPN labs, Nephrology plans, and [place] discussions F/u Surgery plans to advance TF and ability to wean TPN   Sloan Leiter, PharmD, BCPS, BCCCP Clinical Pharmacist Clinical phone 01/08/2021 [phone] Please refer to AMION for [place] numbers 01/08/2021 7:20 AM

## 2021-01-08 NOTE — Progress Notes (Addendum)
NAME:  [name], MRN:  [id number], DOB:  12-Feb-1951, LOS: 66 ADMISSION DATE:  12/25/2020, CONSULTATION DATE: 12/30/2020 REFERRING MD:  Jesusita Oka, MD, CHIEF COMPLAINT: Acute hypoxic respiratory failure post laparotomy  Brief History:  70 year old male with end-stage renal disease on dialysis TTS, admitted with nausea, vomiting, and abdominal pain, noted to have pneumoperitoneum.  Underwent laparotomy 1/20 which was negative.  Incidentally found to be COVID-positive. On 1/30, his LFTs started to elevate and KUB showed worsening intraperitoneal air.  He was taken back to OR 1/31 for ex lap with end colostomy and left colectomy.  Returns to ICU on two vasopressors and remains intubated secondary to hemodynamic instability.   History of Present Illness:  70 year old male with diabetes, ESRD on HD, TTS, chronic diarrhea, chronic hypotension, PAF - systemic AC on hold w/hx of GIB, admitted 1/19 from SNF with N/V and abd pain found to have pneumoperitoneum.   Prior to this he had prolonged hospitalization 12/3- 1/10  at [facility] with diarrhea, dehydration/ hypotension, deconditioning being bed bound with sacral decub, then previously 11/12- 12/2 with syncope and colitis.   Underwent laparotomy 1/20 without findings of bowel perforation.  Incidentally COVID positive.  He was transferred to [facility] on 1/22.  He was doing better however his LFTs elevated on 1/30 and KUB 1/31 showed worsening intraperitoneal air.  Of note he has previously had multiple ulcers in the sigmoid colon of nonspecific etiology.  He was taken back to the OR today, 1/31 and underwent exploratory laparotomy with end colostomy and left colectomy- noting that entire left colon was boggy thought secondary to ischemia; also noted to have ulcerated area on anterior rectum.  Post operative, he returns to ICU on two vasopressors and remains intubated due to hemodynamic instability. EBL 50 ml.  PCCM re-consulted for ICU management.   Past Medical  History:  End-stage renal disease on dialysis Diabetes type 2 Paroxysmal A. fib Orthostatic hypotension Unstageable sacral decubitus ulcer (POA)  Significant Hospital Events:  1/19>> Admit to [facility] for abdominal pain-pneumoperitoneum on imaging studies- 1/20 exploratory laparotomy negative/ extubated  1/21 iHD, low dose phenyl needed, weaned off after iHD stopped, started on stress dose steroids - got 1 dose and was stopped, resumed home midodrine 1/22>> transferred to [facility] service. 1/26>> plans to discharge back to SNF-however upon further evaluation-needed inpatient hydrotherapy for worsening sacral wound. 1/27>> general surgery consulted for sacral wound-continue hydrotherapy for few more days. 1/31 Back to the OR for Pneumoperitoneum ( Exp. Lap Left colectomy, end colectomy), to ICU for shock post op, remains on vent 2/4 extubated 2/6 off pressors 2/7 did short session of iHD, Afib RVR hypotensive at first attempt 2/8 Pressors back on late afternoon  Consults:  [facility] Nephrology   Procedures:  1/10 right subclavian DL TDC >>  1/20 ex lap >> neg 1/31 ex lap >> end colostomy/ left colectomy   ETT 1/20; 1/31 >> Left radial aline 1/31 >>  Significant Diagnostic Tests:  1/20 CT abdomen and pelvis: There is a focal area of wall thickening with adjacent perforation of the distal descending colon at the junction of the sigmoid colon, likely the transition point. 2. There is moderately dilated sigmoid colon and rectum which is stool-filled and suggestion of possible pneumatosis. 3. Free air seen under the right hemidiaphragm and within the porta hepatis. 4. Small amount of air seen within the inferior right liver lobe which is concerning for portal venous gas given the patient's findings 5. Bilateral sacral decubitus ulceration with  phlegmon and subcutaneous emphysema extending to the inferior coccyx.  1/31 KUB >> Massive pneumoperitoneum again noted. Note that this typically  more than would be expected given laparotomy on 12/03/2020 and an ongoing air leak secondary to visceral perforation should be considered.  Repeat CT imaging with oral contrast may be helpful to identify the source of leak.  Suspected long segment colitis involving the distal descending and sigmoid colon.  2/1 KUB>> stable massive pneumoperitoneum; gastric tube in stomach   Micro Data:  1/20 influenza PCR negative 1/20 COVID PCR positive 1/21 MRSA PCR >> positive  1/31 BCx2 >> NGTD  Antimicrobials:  Zosyn 1/19 > 1/24; 1/31 >> 2/4  Interim History / Subjective:  Off CRRT. Bps a bit better. Awaiting iHD trial.   Objective   Blood pressure (!) 130/50, pulse 90, temperature 98.5 F (36.9 C), temperature source Axillary, resp. rate (!) 23, height 6\' 2"  (1.88 m), weight 60.5 kg, SpO2 96 %.        Intake/Output Summary (Last 24 hours) at 01/08/2021 1341 Last data filed at 01/08/2021 1300 Gross per 24 hour  Intake 2241.87 ml  Output 695 ml  Net 1546.87 ml   Filed Weights   01/07/21 1440 01/07/21 1730 01/08/21 0427  Weight: 65.6 kg 65.5 kg 60.5 kg   Physical Exam: General: Chronically ill-appearing, cachectic, thin malnourished, no acute distress HENT: [place], AT, on [place] Eyes: EOMI, no scleral icterus Respiratory: Clear to auscultation bilaterally.  No crackles, wheezing or rales Cardiovascular: RRR, -M/R/G, no JVD GI: BS+, open abdomen, left lower quadrant ostomy Extremities: 1+ pitting edema in upper and lower extremities Neuro: Awake and alert, follows commands   Resolved Hospital Problem list   Large bowel obstruction status post exploratory laparotomy Acute metabolic acidosis COVID 19 infection- off isolation   Assessment & Plan:  Pneumoperitoneum s/p ex-lap resulting in left colectomy with end colostomy  Ischemic bowel - s/p end colostomy and left colectomy 1/31, evidence of ischemic left colon - Management per CCS - no role for additional surgery -TPN per pharmacy started  2/1  - trickle TF 2/4, advance slowly starting 2/7  Hypotension, Septic shock, hx orthostatic: multifactorial- poor nutrition, chronic hypotension, AI, sedation, rule out intra-abdominal infection given microperforation/ pneumoperitoneum, sacral ulcer - wean stress dose steroids end 2/6 - Resume home florinef 2/7 - trend CBC - follow blood cultures, ngtd - Zosyn for 5 days(ended 2/4) - Home midodrine 10 mg TID - SBP goal > 100 - titrating pressors, NE resumed 2/8  Acute hypoxic respiratory insufficiency post-operatively, previously due to volume overload from missing HD session Open abdomen - Extubated 2/4 - volume removal via dialysis  ESRD on TTS (new to HD since 10/2020) - Dialysis per nephrology - needs to tolerate iHD without incident before can be downgraded - trend renal indices   Delirium: Encephalopathic overnight 2/5, improved on precedex. --precedex, wean as able  Diabetes type 2: Worsened hyperglycemia with stress dose steroids - SSI, lantus 20 u qhs  PAF w/RVR - remains in NSR - PO amiodarone held - hold betablocker - no systemic AC given prior GIB  Sacral wound- un-stageable, present on admission - per WOC/ surgery recommendations, hydrotherapy as tolerated  - no role for OR per surgery  Anemia- multifactorial, of chronic illness, ABLA - transfused 1 unit 1/29 , 1/30, 2/3 - remains on PPI BID - trend CBC - transfuse for Hgb < 7  Thrombocytopenia - suspect sepsis -  Platelets trend stable, continue to monitor/ trend  -  Transfuse  for goal plt >10k  Chronic debility/ deconditioning -  mostly bed bound now, PT/ OT  Protein calorie malnutrition>> severe - TPN per pharmacy  - TF trickle 2/4, increase 2/7  Best practice (evaluated daily)  Diet: NPO, TPN, TF Pain/Anxiety/Delirium protocol (if indicated): n/a VAP protocol (if indicated): n/a DVT prophylaxis: SCDs only  GI prophylaxis: PPI  Glucose control: SSI sensitive Mobility: PT/OT Disposition:  ICU  Goals of Care:   Code Status: Limitations Poor prognosis, Goals of care discussion with patient and palliative care 2/4 limits to no CPR, otherwise would want everything else.  Labs   CBC: Recent Labs  Lab 01/02/21 0305 01/03/21 0439 01/03/21 1308 01/04/21 0304 01/04/21 0834 01/05/21 0827 01/05/21 2027 01/06/21 0836 01/06/21 1607 01/07/21 0333  WBC 10.9* 11.6* 11.9* 12.2*  --  11.2*  --  8.6  --  13.2*  NEUTROABS 9.9* 10.6*  --  11.0*  --   --   --   --   --  11.4*  HGB 7.7* 6.3* 8.2* 6.8*   < > 8.2* 8.5* 6.6* 7.6* 8.4*  HCT 24.0* 19.1* 24.5* 21.3*   < > 25.7* 25.0* 19.8* 23.7* 25.6*  MCV 94.9 95.5 93.2 95.5  --  92.8  --  93.0  --  92.1  PLT 49* 36* 38* 32*  --  21*  --  43*  --  65*   < > = values in this interval not displayed.    Basic Metabolic Panel: Recent Labs  Lab 01/04/21 0304 01/04/21 1604 01/05/21 0516 01/05/21 1847 01/05/21 2027 01/06/21 0403 01/07/21 0333 01/08/21 0422  NA 133*   < > 134* 133* 136 136 133* 137  K 3.8   < > 3.3* 3.4* 3.5 3.3* 3.1* 3.4*  CL 100   < > 101 100  --  103 97* 102  CO2 18*   < > 19* 19*  --  22 20* 20*  GLUCOSE 351*   < > 261* 298*  --  252* 197* 187*  BUN 77*   < > 56* 48*  --  58* 93* 78*  CREATININE 4.25*   < > 2.60* 2.11*  --  2.43* 3.07* 2.68*  CALCIUM 7.1*   < > 7.3* 7.4*  --  7.2* 7.4* 7.3*  MG 1.8  --  2.0  --   --  2.0 1.9 1.9  PHOS 3.6   < > 1.3* 1.4*  --  1.3* 2.4* 1.7*   < > = values in this interval not displayed.   GFR: Estimated Creatinine Clearance: 21.9 mL/min (A) (by C-G formula based on SCr of 2.68 mg/dL (H)). Recent Labs  Lab 01/04/21 0304 01/05/21 0827 01/06/21 0836 01/07/21 0333  WBC 12.2* 11.2* 8.6 13.2*    Liver Function Tests: Recent Labs  Lab 01/02/21 0305 01/03/21 0301 01/03/21 1641 01/04/21 0304 01/04/21 1604 01/05/21 0516 01/05/21 1847 01/06/21 0403 01/07/21 0333 01/08/21 0422  AST 58* 33  --  40  --   --   --   --  47*  --   ALT 86* 61*  --  50*  --   --   --   --   62*  --   ALKPHOS 120 99  --  98  --   --   --   --  108  --   BILITOT 1.2 0.9  --  2.2*  --   --   --   --  1.6*  --   PROT 4.6* 4.3*  --  4.2*  --   --   --   --  4.3*  --   ALBUMIN 1.7* 1.6*   < > 1.5*   < > 1.6* 1.6* 1.4* 1.5* 1.6*   < > = values in this interval not displayed.   No results for input(s): LIPASE, AMYLASE in the last 168 hours. No results for input(s): AMMONIA in the last 168 hours.  ABG    Component Value Date/Time   PHART 7.546 (H) 01/05/2021 2027   PCO2ART 25.5 (L) 01/05/2021 2027   PO2ART 68 (L) 01/05/2021 2027   HCO3 22.1 01/05/2021 2027   TCO2 23 01/05/2021 2027   ACIDBASEDEF 5.0 (H) 12/29/2020 1751   O2SAT 96.0 01/05/2021 2027     Coagulation Profile: Recent Labs  Lab 01/01/21 1730 01/02/21 0305 01/03/21 0301 01/04/21 0304 01/05/21 0516  INR 1.1 1.1 1.2 1.3* 1.2    Cardiac Enzymes: No results for input(s): CKTOTAL, CKMB, CKMBINDEX, TROPONINI in the last 168 hours.  HbA1C: Hgb A1c MFr Bld  Date/Time Value Ref Range Status  12/21/2020 12:08 PM 4.1 (L) 4.8 - 5.6 % Final    Comment:    (NOTE) Pre diabetes:          5.7%-6.4%  Diabetes:              >6.4%  Glycemic control for   <7.0% adults with diabetes   06/13/2018 03:16 AM 6.6 (H) 4.8 - 5.6 % Final    Comment:    (NOTE) Pre diabetes:          5.7%-6.4% Diabetes:              >6.4% Glycemic control for   <7.0% adults with diabetes     CBG: Recent Labs  Lab 01/08/21 0007 01/08/21 0012 01/08/21 0424 01/08/21 0805 01/08/21 1141  GLUCAP 229* 184* 146* 194* 249*    CRITICAL CARE Performed by: Bonna Gains Tallulah Hosman   Total critical care time: 31 minutes  Critical care time was exclusive of separately billable procedures and treating other patients.  Critical care was necessary to treat or prevent imminent or life-threatening deterioration.  Critical care was time spent personally by me on the following activities: development of treatment plan with patient and/or  surrogate as well as nursing, discussions with consultants, evaluation of patient's response to treatment, examination of patient, obtaining history from patient or surrogate, ordering and performing treatments and interventions, ordering and review of laboratory studies, ordering and review of radiographic studies, pulse oximetry and re-evaluation of patient's condition.    Lanier Clam, MD

## 2021-01-08 NOTE — Progress Notes (Signed)
This chaplain responded to PMT consult for Pt. spiritual care and completing of Pt. HCPOA.  The chaplain phoned the Pt. daughter-[name] at the number in Epic for clarity in the next steps.  The chaplain understands [place] desires HCPOA documentation in [place].  The chaplain confirmed with [name], she is surrogate decision maker by the [place] [employer].  The chaplain will attempt completion of Pt. HCPOA education and documentation.  Making available the Pt. HCPOA document for notarizing on Friday.  [name] accepted F/U spiritual care as needed.

## 2021-01-08 NOTE — Progress Notes (Signed)
8 Days Post-Op  Subjective: CC: Patient being seen by hydro, on his side. Off vent. He is tolerating tf's without n/v or increased abdominal discomfort. Having colostomy output with 600 documented in the last 24 hours.   Objective: Vital signs in last 24 hours: Temp:  [97.6 F (36.4 C)-98.7 F (37.1 C)] 98.1 F (36.7 C) (02/08 0806) Pulse Rate:  [80-117] 93 (02/08 0800) Resp:  [16-26] 23 (02/08 0800) BP: (80-137)/(29-57) 130/50 (02/07 1750) SpO2:  [93 %-99 %] 94 % (02/08 0800) Arterial Line BP: (89-149)/(30-57) 131/37 (02/08 0800) Weight:  [60.5 kg-65.6 kg] 60.5 kg (02/08 0427) Last BM Date: 01/08/21  Intake/Output from previous day: 02/07 0701 - 02/08 0700 In: 2316.5 [I.V.:1874.4; NG/GT:440; IV Piggyback:2.1] Out: 835 [Drains:235; Stool:600] Intake/Output this shift: No intake/output data recorded.     PE: Gen: Awake and alert, cooperative and no distress Lungs: Normal rate and effort  GI: soft, midline wound with some fat necrosis at the lower portion of the wound and serous drainage. There is healthy granulation tissue at the base on wound edges otherwise. Wound appears stable from picture yesterday. No dehiscence or eviseration.  Stoma pink. Colostomy bag with air and some some liquid brown stool in the bag. + BS GU: Sacral wound stable from yesterdays picture. There is granulation tissue at the base of the wound with some fibrinous tissue in the middle. Hydro had already started packing the wound but reported 6cm of undermining at left lateral portion of the wound  Lab Results:  Recent Labs    01/06/21 0836 01/06/21 1607 01/07/21 0333  WBC 8.6  --  13.2*  HGB 6.6* 7.6* 8.4*  HCT 19.8* 23.7* 25.6*  PLT 43*  --  65*   BMET Recent Labs    01/07/21 0333 01/08/21 0422  NA 133* 137  K 3.1* 3.4*  CL 97* 102  CO2 20* 20*  GLUCOSE 197* 187*  BUN 93* 78*  CREATININE 3.07* 2.68*  CALCIUM 7.4* 7.3*   PT/INR No results for input(s): LABPROT, INR in the last  72 hours. CMP     Component Value Date/Time   NA 137 01/08/2021 0422   K 3.4 (L) 01/08/2021 0422   CL 102 01/08/2021 0422   CO2 20 (L) 01/08/2021 0422   GLUCOSE 187 (H) 01/08/2021 0422   BUN 78 (H) 01/08/2021 0422   CREATININE 2.68 (H) 01/08/2021 0422   CALCIUM 7.3 (L) 01/08/2021 0422   PROT 4.3 (L) 01/07/2021 0333   ALBUMIN 1.6 (L) 01/08/2021 0422   AST 47 (H) 01/07/2021 0333   ALT 62 (H) 01/07/2021 0333   ALKPHOS 108 01/07/2021 0333   BILITOT 1.6 (H) 01/07/2021 0333   GFRNONAA 25 (L) 01/08/2021 0422   GFRAA 19 (L) 06/15/2018 0407   Lipase     Component Value Date/Time   LIPASE 14 12/12/2020 0040       Studies/Results: No results found.  Anti-infectives: Anti-infectives (From admission, onward)   Start     Dose/Rate Route Frequency Ordered Stop   01/03/21 1300  piperacillin-tazobactam (ZOSYN) IVPB 3.375 g  Status:  Discontinued        3.375 g 12.5 mL/hr over 240 Minutes Intravenous Every 6 hours 01/03/21 1204 01/03/21 1204   01/03/21 1300  piperacillin-tazobactam (ZOSYN) IVPB 3.375 g        3.375 g 100 mL/hr over 30 Minutes Intravenous Every 6 hours 01/03/21 1205 01/05/21 0642   12/30/2020 1600  piperacillin-tazobactam (ZOSYN) IVPB 2.25 g  Status:  Discontinued  2.25 g 100 mL/hr over 30 Minutes Intravenous Every 8 hours 12/29/2020 1507 01/03/21 1204   12/09/2020 1315  piperacillin-tazobactam (ZOSYN) IVPB 3.375 g  Status:  Discontinued        3.375 g 12.5 mL/hr over 240 Minutes Intravenous To Surgery 12/16/2020 1301 01/01/21 0846   12/02/2020 1500  piperacillin-tazobactam (ZOSYN) IVPB 3.375 g        3.375 g 12.5 mL/hr over 240 Minutes Intravenous Every 12 hours 12/05/2020 1219 12/24/20 2001   12/25/2020 1200  piperacillin-tazobactam (ZOSYN) IVPB 2.25 g  Status:  Discontinued        2.25 g 100 mL/hr over 30 Minutes Intravenous Every 8 hours 12/25/2020 0205 12/22/2020 1219   12/15/2020 0215  piperacillin-tazobactam (ZOSYN) IVPB 3.375 g        3.375 g 100 mL/hr over 30 Minutes  Intravenous  Once 12/12/2020 0205 12/29/2020 0435       Assessment/Plan ESRD on dialysis  Hx PAF Covid positive -off restrictions Bedbound Anemia/thrombocytopenia Protein calorie malnutrition- severe (prealbumin<5) >> 9.7 (2/7)  Pneumoperitoneum, pneumatosis, portal venous gas S/p negativeexploratory laparotomy1/20 Dr. Bobbye Morton POD#19 - intraop: no acute findings, no contamination, excellent blood flow to intestine,congested rectosigmoid colon -Worsening pneumoperitoneum S/p Exploratory laparotomy, left colectomy, end colostomy, 12/03/2020 - Dr. Rolm Bookbinder POD #8 - Path w/ severely active chronic nonspecific colitis with extensive ulceration. Negative for granulomata and dysplasia.  - Post-op ileus resolving (tolerating TF's at 30cc/hr without n/v and having colostomy output). Slowly adv TF's to goal. 1/2 TPN today with plan to wean off tomorrow.  - WOCN following for new ostomy - Midline wound BID WTD dressing changes - Completed post op course of abx - Continue JP drain - PT following and recommending SNF - Pulm toilet.   Chronic stage IV sacral decubitus pressure ulcer(12/10/20, first picture [facility]) - Wound evaluated with hydro. Wound base with mostly granulation tissue. There is some undermining on the left portion of the wound with drainage. Peri wound with some necrosis that hydro is debriding at bedside. May need surgery for this in the future, but given he is not ill/septic from this our team is not recommending OR at this time. Continue Hydro and BID WTD dressing changes. In chart review, the site looks much better than last week.  ID-Zosyn 1/20-1/25, restart1/31-01/05/21. WBC 13.2 on 2/7. Afebrile.  FEN- NPO. 1/2 TPN. Inc TF to goal (per Dr. Bobbye Morton - if patient requires > 2mcg Levo, please back down to trickle TF's) VTE-SCD's, chemical VTE per CCM. Currently on hold for Anemia/thrombocytopenia     LOS: 19 days    Jillyn Ledger , [facility] 01/08/2021, 9:58 AM Please see Amion for pager number during day hours 7:00am-4:30pm

## 2021-01-08 NOTE — Consult Note (Signed)
[place] Nurse wound follow up Patient receiving care in [facility] 2M12. Wound type: sacral wound undergoing hydrotherapy by PT.  See PT details in their notes for wound specifics. I conferred with PT, C. Owens Shark, and she is recommending continued hydrotherapy and existing POC for wound.  No changes needed at this time. Val Riles, RN, MSN, CWOCN, CNS-BC, pager [phone]

## 2021-01-08 NOTE — Progress Notes (Signed)
[facility] Progress Note  Subjective: seen in ICU, having some afib/ RVR issues. BP's soft , not requiring pressor support.   Vitals:   01/08/21 1415 01/08/21 1430 01/08/21 1445 01/08/21 1500  BP:      Pulse: 99 99 (!) 101 (!) 105  Resp: (!) 25 (!) 24 17 (!) 25  Temp:      TempSrc:      SpO2: 97% 97% 98% 95%  Weight:      Height:       Exam: Gen: frail, chronically ill appearing cachectic man, withdrawn  CVS: no MRG Resp: occ rhonchi Abd: +BS, colostomy in LLQ, bandage over incision RLQ Ext: 1-2+ UE > LE edema   RIJ TDC   OP HD: [facility] [place] [facility] 66.5kg dry wt per OP HD RN/ R IJ TDC   Assessment/Plan: 1. Ischemic bowel withPneumoperitoneum/ shock -sp exlap, no perforated viscous found.RepeatAbd films showed worsening pneumoperitoneum and pt iss/p left colectomy/colostomy 12/16/2020. 2. ESRD - on HD TTS. HD tomorrow off schedule. Initiated CRRT 01/03/21 due to limited UF w/ reg HD, but had issues and really only had 2 good days of CRRT. CRRT dc'd due to agitation and filter issues. Will cont HD if pt is tolerating. Vol issues may be a problem. Not a  candidate for further CRRT w/ comorbidities.  3. Covid positive -off isolation 4. Orthostatic hypotension - chronic issue, pt is bed-bound it appears and does stretcher dialysis w/ the group in [facility]. On midodrine and fluorinef outpatient per charting.  5. BP /volume - has sig LE/ UE edema, but able to UF 4 liters with CRRT for 48 hours., no resp issues. Unable to UF large amts due to low alb/ 3rd spacing of fluid.  6. DM2 - per primary team  7. Anemia ckd - no emergent need for PRBC's. need records re: ESA dosing.  8. Hypocalcemia - repleted; added ca bath 9. Hypophosphatemia - will give IV phos again and follow.  10. Thrombocytopenia- transfuse as needed 11. Prognosis - very poorand has been seen by Palliative care in the pastand no evidence of improvement.He has severe  autonomic dysfunction and is bed bound and can only get dialysis on a stretcher. Now with perforated colon s/p colectomy/colostomy, ongoing hypotension and hypoalbuminemia with poor chance of meaningful recovery or healing.Appreciate Palliative care assistance with goals of care and possible transition to comfort care.   Kelly Splinter, MD 01/08/2021, 3:34 PM      Rob Buras 01/08/2021, 3:37 PM   Recent Labs  Lab 01/06/21 1607 01/07/21 0333 01/08/21 0422  K  --  3.1* 3.4*  BUN  --  93* 78*  CREATININE  --  3.07* 2.68*  CALCIUM  --  7.4* 7.3*  PHOS  --  2.4* 1.7*  HGB 7.6* 8.4*  --    Inpatient medications: . alteplase  2 mg Intracatheter Once  . vitamin C  500 mg Oral Daily  . chlorhexidine  15 mL Mouth Rinse BID  . Chlorhexidine Gluconate Cloth  6 each Topical Q0600  . collagenase   Topical Daily  . feeding supplement (PROSource TF)  90 mL Per Tube TID  . fludrocortisone  0.2 mg Oral Daily  . insulin aspart  0-20 Units Subcutaneous Q4H  . insulin glargine  20 Units Subcutaneous QHS  . mouth rinse  15 mL Mouth Rinse q12n4p  . midodrine  10 mg Oral TID WC  . mupirocin ointment   Nasal BID  . pantoprazole (  PROTONIX) IV  40 mg Intravenous Q24H  . sodium chloride flush  10-40 mL Intracatheter Q12H  . zinc sulfate  220 mg Oral Daily   . amiodarone    . feeding supplement (VITAL 1.5 CAL) 1,000 mL (01/08/21 1537)  . potassium PHOSPHATE IVPB (in mmol) 43 mL/hr at 01/08/21 1500  . TPN ADULT (ION) 75 mL/hr at 01/08/21 1500  . TPN ADULT (ION)     acetaminophen (TYLENOL) oral liquid 160 mg/5 mL, alteplase, fentaNYL (SUBLIMAZE) injection, heparin, heparin, hydrALAZINE, sodium chloride, sodium chloride flush

## 2021-01-08 NOTE — Care Management (Signed)
Spoke w [facility] and cancelled transportation to HD, due to inpatient stay.  Next scheduled transportation to HD will be 2/16. Please call [facility] at [phone] if needs change.

## 2021-01-08 NOTE — Progress Notes (Signed)
eLink Physician-Brief Progress Note Patient Name: AKSHAT MINEHART DOB: [birth year] MRN: [id number]   Date of Service  01/08/2021  HPI/Events of Note  Multiple issues: 1. Vomiting - Request for Zofran. Unable to take Oxycodone PO. QTc interval = 0.42 seconds. 2. Pain - Related to unstageable sacral ulcer that causes extreme pain with care. Issue not addressed adequately on rounds.   eICU Interventions  Plan: 1. Zofran 4 mg IV Q 6 hours PRN N/V. 2. D/C Oxycodone. 3. Change Fentanyl to 50 mcg IV Q 4 hours PRN pain or hydrotherapy.      Intervention Category Major Interventions: Other:  Lysle Dingwall 01/08/2021, 8:56 PM

## 2021-01-09 DIAGNOSIS — R7401 Elevation of levels of liver transaminase levels: Secondary | ICD-10-CM

## 2021-01-09 DIAGNOSIS — R52 Pain, unspecified: Secondary | ICD-10-CM

## 2021-01-09 DIAGNOSIS — J9601 Acute respiratory failure with hypoxia: Secondary | ICD-10-CM | POA: Diagnosis not present

## 2021-01-09 DIAGNOSIS — R579 Shock, unspecified: Secondary | ICD-10-CM

## 2021-01-09 DIAGNOSIS — Z515 Encounter for palliative care: Secondary | ICD-10-CM | POA: Diagnosis not present

## 2021-01-09 DIAGNOSIS — K668 Other specified disorders of peritoneum: Secondary | ICD-10-CM | POA: Diagnosis not present

## 2021-01-09 DIAGNOSIS — A419 Sepsis, unspecified organism: Secondary | ICD-10-CM | POA: Diagnosis not present

## 2021-01-09 DIAGNOSIS — Z66 Do not resuscitate: Secondary | ICD-10-CM

## 2021-01-09 DIAGNOSIS — K631 Perforation of intestine (nontraumatic): Secondary | ICD-10-CM | POA: Diagnosis not present

## 2021-01-09 LAB — RENAL FUNCTION PANEL
Albumin: 1.5 g/dL — ABNORMAL LOW (ref 3.5–5.0)
Anion gap: 18 — ABNORMAL HIGH (ref 5–15)
BUN: 110 mg/dL — ABNORMAL HIGH (ref 8–23)
CO2: 21 mmol/L — ABNORMAL LOW (ref 22–32)
Calcium: 7.5 mg/dL — ABNORMAL LOW (ref 8.9–10.3)
Chloride: 99 mmol/L (ref 98–111)
Creatinine, Ser: 3.55 mg/dL — ABNORMAL HIGH (ref 0.61–1.24)
GFR, Estimated: 18 mL/min — ABNORMAL LOW (ref >60)
Glucose, Bld: 115 mg/dL — ABNORMAL HIGH (ref 70–99)
Phosphorus: 4 mg/dL (ref 2.5–4.6)
Potassium: 3.8 mmol/L (ref 3.5–5.1)
Sodium: 138 mmol/L (ref 135–145)

## 2021-01-09 LAB — GLUCOSE, CAPILLARY
Glucose-Capillary: 100 mg/dL — ABNORMAL HIGH (ref 70–99)
Glucose-Capillary: 104 mg/dL — ABNORMAL HIGH (ref 70–99)
Glucose-Capillary: 99 mg/dL (ref 70–99)

## 2021-01-09 LAB — MAGNESIUM: Magnesium: 1.9 mg/dL (ref 1.7–2.4)

## 2021-01-09 MED ORDER — PANTOPRAZOLE SODIUM 40 MG PO PACK: 40.0000 mg | PACK | Freq: Every day | ORAL | Status: DC

## 2021-01-09 MED ORDER — FLUDROCORTISONE ACETATE 0.1 MG PO TABS
0.2000 mg | ORAL_TABLET | Freq: Every day | ORAL | Status: DC
  Administered 2021-01-09: 0.2 mg
  Filled 2021-01-09: qty 2

## 2021-01-09 MED ORDER — MIDODRINE HCL 5 MG PO TABS
20.0000 mg | ORAL_TABLET | Freq: Three times a day (TID) | ORAL | Status: DC
  Administered 2021-01-09: 20 mg via ORAL
  Filled 2021-01-09: qty 4

## 2021-01-09 MED ORDER — HYDROMORPHONE HCL 1 MG/ML IJ SOLN
0.5000 mg | INTRAMUSCULAR | Status: DC | PRN
  Administered 2021-01-09: 0.5 mg via INTRAVENOUS
  Filled 2021-01-09: qty 0.5

## 2021-01-09 MED ORDER — MIDODRINE HCL 5 MG PO TABS
10.0000 mg | ORAL_TABLET | Freq: Three times a day (TID) | ORAL | Status: DC
  Administered 2021-01-09: 10 mg
  Filled 2021-01-09: qty 2

## 2021-01-09 MED ORDER — ASCORBIC ACID 500 MG PO TABS
500.0000 mg | ORAL_TABLET | Freq: Every day | ORAL | Status: DC
  Filled 2021-01-09: qty 1

## 2021-01-09 MED ORDER — ZINC SULFATE 220 (50 ZN) MG PO CAPS
220.0000 mg | ORAL_CAPSULE | Freq: Every day | ORAL | Status: DC
  Filled 2021-01-09: qty 1

## 2021-01-09 MED ORDER — GLYCOPYRROLATE 0.2 MG/ML IJ SOLN
0.2000 mg | INTRAMUSCULAR | Status: DC | PRN
  Administered 2021-01-09: 0.2 mg via INTRAVENOUS
  Filled 2021-01-09: qty 1

## 2021-01-09 MED ORDER — INSULIN GLARGINE 100 UNIT/ML ~~LOC~~ SOLN
12.0000 [IU] | Freq: Every day | SUBCUTANEOUS | Status: DC
  Filled 2021-01-09: qty 0.12

## 2021-01-09 MED ORDER — HYDROMORPHONE HCL 1 MG/ML IJ SOLN
0.5000 mg | INTRAMUSCULAR | Status: DC | PRN
  Administered 2021-01-09: 1 mg via INTRAVENOUS
  Filled 2021-01-09: qty 1

## 2021-01-09 MED ORDER — HYDROMORPHONE HCL 1 MG/ML IJ SOLN
1.0000 mg | INTRAMUSCULAR | Status: DC | PRN
  Administered 2021-01-09: 1 mg via INTRAVENOUS
  Administered 2021-01-09: 2 mg via INTRAVENOUS
  Administered 2021-01-10: 1 mg via INTRAVENOUS
  Filled 2021-01-09: qty 1
  Filled 2021-01-09: qty 2
  Filled 2021-01-09: qty 1
  Filled 2021-01-09: qty 2

## 2021-01-09 MED ORDER — LORAZEPAM 2 MG/ML IJ SOLN
0.5000 mg | INTRAMUSCULAR | Status: DC | PRN
  Filled 2021-01-09: qty 1

## 2021-01-09 NOTE — Progress Notes (Signed)
This chaplain attempted [facility] education in preparation of having a notary visit on Friday. The chaplain understands the goal is to finalize the naming of the Pt. daughter-[name] as healthcare agent.  The chaplain will revisit. Palliative Care NP-MM is with the Pt. at the time of the chaplain visit.

## 2021-01-09 NOTE — Progress Notes (Addendum)
Daily Progress Note   Patient Name: [name]       Date: 01/09/2021 DOB: January 26, 1951  Age: 70 y.o. MRN#: [id number] Attending Physician: Spero Geralds, MD Primary Care Physician: Patient, No Pcp Per Admit Date: 12/04/2020   Reason for Consultation/Follow-up: Establishing goals of care  Subjective:  Patient will wake to voice but appears very weak and drowsy. Does not fully engage in conversation but alert and oriented.   GOC:  Spoke with patient myself this morning about his goals. [name] made a comment about how he has "been through enough." Dr. Jonnie Finner and I followed up with patient at bedside regarding recommendation for comfort measures with poor prognosis and poor candidacy for future procedures or surgeries. Recommended discontinuation of hemodialysis and other interventions that will continue to prolong his suffering. Patient nods his head yes in understanding of this. Patient requests I notify his daughter, [name].   Spoke with [place] via telephone to provide update on care plan and recommendation for comfort. [name] is tearful but not surprised by this news, sharing she did not believe there would be a good outcome for her father, understanding how sick he is. Discussed shift to comfort including no further dialysis or interventions not aimed at comfort. Emphasized focus on symptom management to ensure comfort and relief from suffering. Discussed comfort feeds/sips, unrestricted visitor access. Also explained transfer to 6N and further discussions regarding hospice if appropriate. [name] understands and agrees with plan.   Went back to bedside and with patient permission, facetimed with [name]. Reviewed plan for comfort, discussed symptom management medications, and at this point  transition to full DNR code status in order to allow him to pass naturally and peacefully. Patient/daughter understand and agree with shift to comfort. Patient perks up and starts discussing his wishes regarding certain assets with [name]. Patient requests I call his girlfriend [name] and update her on his condition and plan. [name] is hopeful to visit her father Thursday or Friday.   Answered all questions. Encouraged [name] to call me this afternoon if necessary. Reassured of ongoing support.  **Discussed in detail with Dr. Shearon Stalls, Dr. Jonnie Finner, [place]. Also with surgery PA Legrand Como.     Length of Stay: 20  Current Medications: Scheduled Meds:  . vitamin C  500 mg Per Tube Daily  . chlorhexidine  15 mL Mouth Rinse BID  . Chlorhexidine Gluconate Cloth  6 each Topical Q0600  . collagenase   Topical Daily  . feeding supplement (PROSource TF)  90 mL Per Tube TID  . fludrocortisone  0.2 mg Per Tube Daily  . insulin aspart  0-20 Units Subcutaneous Q4H  . insulin glargine  12 Units Subcutaneous QHS  . mouth rinse  15 mL Mouth Rinse q12n4p  . midodrine  20 mg Oral TID WC  . mupirocin ointment   Nasal BID  . [START ON Jan 13, 2021] pantoprazole sodium  40 mg Per Tube Daily  . sodium chloride flush  10-40 mL Intracatheter Q12H  . zinc sulfate  220 mg Per Tube Daily    Continuous Infusions: . feeding supplement (VITAL 1.5 CAL) 55 mL/hr at 01/08/21 2349  . norepinephrine (LEVOPHED) Adult infusion 4 mcg/min (01/09/21 0922)  . TPN ADULT (ION) 37 mL/hr at 01/08/21 1800    PRN Meds: acetaminophen (TYLENOL) oral liquid 160 mg/5 mL, alteplase, heparin, heparin, hydrALAZINE, HYDROmorphone (DILAUDID) injection, ondansetron (ZOFRAN) IV, sodium chloride, sodium chloride flush  Physical Exam Vitals and nursing note reviewed.  Constitutional:      Appearance: He is cachectic. He is ill-appearing.  HENT:     Head: Normocephalic and atraumatic.  Cardiovascular:     Rate and Rhythm: Normal rate.      Comments: NSR Pulmonary:     Effort: No tachypnea, accessory muscle usage or respiratory distress.     Breath sounds: Decreased breath sounds present.  Abdominal:     Comments: Midline incision C/D/I. Colostomy   Skin:    General: Skin is warm and dry.  Neurological:     Mental Status: He is alert, oriented to person, place, and time and easily aroused.     Comments: Drowsy, weak            Vital Signs: BP (!) 130/50   Pulse (!) 102   Temp 98.6 F (37 C) (Oral)   Resp (!) 24   Ht 6\' 2"  (1.88 m)   Wt 60.5 kg   SpO2 96%   BMI 17.12 kg/m  SpO2: SpO2: 96 % O2 Device: O2 Device: Room Air O2 Flow Rate: O2 Flow Rate (L/min): 2 L/min  Intake/output summary:   Intake/Output Summary (Last 24 hours) at 01/09/2021 1115 Last data filed at 01/09/2021 0600 Gross per 24 hour  Intake 1856.25 ml  Output 411 ml  Net 1445.25 ml   LBM: Last BM Date: 01/09/21 Baseline Weight: Weight: 61.2 kg Most recent weight: Weight: 60.5 kg       Palliative Assessment/Data: PPS 30%   Flowsheet Rows   Flowsheet Row Most Recent Value  Intake Tab   Referral Department Critical care  Unit at Time of Referral ICU  Palliative Care Primary Diagnosis Sepsis/Infectious Disease  Palliative Care Type Return patient Palliative Care  Reason for referral Clarify Goals of Care  Date first seen by Palliative Care 01/03/21  Clinical Assessment   Palliative Performance Scale Score 30%  Psychosocial & Spiritual Assessment   Palliative Care Outcomes   Patient/Family meeting held? Yes  Who was at the meeting? patient and daughter  Palliative Care Outcomes Clarified goals of care, Provided psychosocial or spiritual support, ACP counseling assistance      Patient Active Problem List   Diagnosis Date Noted  . Acute respiratory failure ([place])   . Palliative care by specialist   . Protein-calorie malnutrition, severe 01/02/2021  . Protein-calorie malnutrition ([place])   . Free intraperitoneal air  12/16/2020  .  Perforated sigmoid colon ([place]) 12/31/2020  . Perforated bowel ([place]) 12/06/2020  . Problem with dialysis access ([place])   . Complication of vascular dialysis catheter   . Goals of care, counseling/discussion 11/29/2020  . Abdominal distension   . Colitis   . ESRD on hemodialysis ([place])   . Syncope due to orthostatic hypotension 11/02/2020  . Rectal bleeding   . Acute on chronic anemia   . Diarrhea   . Orthostatic hypotension 10/13/2020  . Chronic diarrhea 10/13/2020  . Colitis presumed infectious 10/13/2020  . Pressure injury of skin 10/13/2020  . ESRD on dialysis ([place]) 10/12/2020  . Unspecified atrial fibrillation ([place]) 10/12/2020  . Syncope 10/12/2020  . Small bowel obstruction ([place])   . Type 2 diabetes mellitus ([place]) 06/11/2018  . Peripheral neuropathy 06/11/2018  . Ankle syndesmosis disruption, right, initial encounter 06/11/2018  . AKI (acute kidney injury) ([place])   . Dehydration   . Open right ankle fracture 06/08/2018  . Fracture, ankle 06/08/2018  . Open displaced fracture of medial malleolus of tibia, type III 06/08/2018    Palliative Care Assessment & Plan   Patient Profile:  70 y.o. male  with past medical history of ESRD on hemodialysis, paroxysmal atrial fibrillation, orthostatic hypotension, GI bleeding, DM type 2. Recent prolonged hospitalization 12/3-1/10 at [facility] with diarrhea, dehydration, sacral decubitus/bed bound status. Admitted on 12/12/2020 with nausea/vomiting/abdominal pain. Found to have pneumoperitoneum. Underwent laparotomy 12/01/2020 which was negative. Found to be COVID positive. 1/30, LFT's increasing and KUB revealed worsening intraperitoneal air. Back to OR on 1/31 for exploratory lap with evidence of ischemic left colon, resulting in colostomy and left colectomy. Returned to ICU requiring ongoing intubation/mechanical ventilation, two vasopressors, and currently on CRRT. Severe protein calorie malnutrition on TPN. Palliative medicine consultation for goals of  care.     2/4 Successfully extubated to nasal cannula. Levo off. Patient awake, alert, oriented and able to participate in [place] discussion.   2/7 Patient stable off ventilator. On room air. Off CRRT and will attempt iHD today. Trickle feeds at 20/hr. +BS and some liquid stool noted in colostomy.   2/9 Patient doing poorly. Poorly tolerated dialysis on Monday. Back on levo. Increasing pain and nausea.   Assessment: Pneumoperitoneum s/p ex-lap, left colectomy with colostomy Ischemic bowel Chronic stage IV sacral decubitus pressure ulcer Septic shock, resolved Chronic hypotension, autonomic dysfunction ESRD on hemodialysis Acute hypoxic respiratory failure, resolved PAF with RVR Delirium Anemia Thrombocytopenia Chronic debility/deconditioning Severe protein calorie malnutrition   Recommendations/Plan:  F/u GOC along with Dr. Jonnie Finner at bedside. Discussed course of hospitalization, poor prognosis, and recommendation for discontinuation of dialysis and shift to comfort in order to prevent further suffering. Patient understands and nods head in agreement with recommendations. He understands poor prognosis.   This NP also spoke with daughter, Donella Stade. She understands his condition and poor prognosis. [name] also agrees with recommendation for shift to comfort measures.   NO further dialysis. Discontinue interventions not aimed at comfort.   Clear liquid diet initiated. Allow comfort sips/bites.  Unrestricted visitor access.   Appreciate ongoing chaplain support and prayer.    Symptom management  Dilaudid 0.5-1mg  IV q2h prn pain/dyspnea/air hunger/tachypnea  Ativan 0.5mg  IV q4h prn anxiety  Robinul 0.2mg  IV q4h prn secretions  Zofran 4mg  IV q6h prn n/v  Wound care as needed   Transfer to 6N for comfort care if bed available  PMT provider will continue to follow. Will further discuss hospice options if appropriate.    Code Status:  DNR/DNI   Code Status Orders  (From  admission, onward)         Start     Ordered   01/04/21 1358  Limited resuscitation (code)  Continuous       Question Answer Comment  In the event of cardiac or respiratory ARREST: Initiate Code Blue, Call Rapid Response Yes   In the event of cardiac or respiratory ARREST: Perform CPR No   In the event of cardiac or respiratory ARREST: Perform Intubation/Mechanical Ventilation Yes   In the event of cardiac or respiratory ARREST: Use NIPPV/BiPAp only if indicated Yes   In the event of cardiac or respiratory ARREST: Administer ACLS medications if indicated Yes   In the event of cardiac or respiratory ARREST: Perform Defibrillation or Cardioversion if indicated Yes   Comments NO CPR per patient wishes      01/04/21 1357        Code Status History    Date Active Date Inactive Code Status Order ID Comments User Context   12/22/2020 0807 01/04/2021 1357 Full Code [id number]  Jacky Kindle, MD Inpatient   12/29/2020 0651 12/10/2020 0806 Full Code [id number]  Omar Person, NP Inpatient   11/02/2020 1521 12/11/2020 1526 Full Code [id number]  Rodena Goldmann, DO Inpatient   10/12/2020 2159 11/02/2020 0136 Full Code [id number]  [place]gergawy, Silver Huguenin, MD ED   06/08/2018 2018 06/15/2018 2016 Full Code [id number]  Sherene Sires, DO Inpatient   06/08/2018 1524 06/08/2018 2017 Full Code [id number]  [facility] ED   Advance Care Planning Activity       Prognosis:  Poor prognosis  Discharge Planning:  To Be Determined: hospital death vs. Further hospice discussions if appropriate  Care plan was discussed with Dr. Shearon Stalls, Dr. Jonnie Finner, RN, patient, daughter  Thank you for allowing the Palliative Medicine Team to assist in the care of this patient.   Total Time 60 Prolonged Time Billed  no    Greater than 50% of this time was spent counseling and coordinating care related to the above assessment and plan.   Ihor Dow, DNP, FNP-C Palliative Medicine Team  Phone: [phone] Fax:  [phone]  Please contact Palliative Medicine Team phone at [phone] for questions and concerns.

## 2021-01-09 NOTE — Progress Notes (Signed)
PHARMACY - TOTAL PARENTERAL NUTRITION CONSULT NOTE  Indication: Massive bowel resection and severe malnutrition  Patient Measurements: Height: 6\' 2"  (188 cm) Weight: 60.5 kg (133 lb 6.1 oz) IBW/kg (Calculated) : 82.2 TPN AdjBW (KG): 69.2 Body mass index is 17.12 kg/m.  Assessment: 36 yom male with diabetes, ESRDon HD, TTS,chronic diarrhea, chronic hypotension, PAF - systemic AC on hold w/hx of GIB, admitted 1/19 from SNF with N/V and abd pain, found to have pneumoperitoneum. Underwent laparotomy 1/20 without findings of bowel perforation.Incidentally COVID positive. He was transferred to [facility] on 1/22 and was doing better; however, LFTs elevated on 1/30, and KUB 1/31 showed worsening intraperitoneal air. Of note, previously had multiple ulcers in sigmoid colon of nonspecific etiology.He was taken back to OR 1/31 and underwent ex-lap with end colostomy and left colectomy, noting entire left colon was boggy, thought secondary to ischemia; also noted to have ulcerated area on anterior rectum. Pharmacy consulted for TPN. Noted, patient bed-bound at baseline.  Patient extubated 2/4. Low-dose norepinephrine off this AM. CRRT stopped overnight due to filter issues. Attempting IHD on 2/7 per Nephrology.  Glucose / Insulin: hypoglycemia 2/8 PM, now low normal.   Lantus 20 units HS and 25 units rSSI (off stress steroid 2/6. Home Florinef resumed.  Electrolytes: low CO2 (max acetate in TPN), iCa low 1.08 on 2/6, others WNL (s/p 15 mmol KPhos) Renal: ESRD on HD TTS PTA. CRRT 2/3>>2/7. S/p iHD 2/7 LFTs / TGs: AST/AST up 47/62, tbili down to 1.6, TG WNL Prealbumin / albumin: prealbumin improved to 9.7, albumin 1.5 Intake / Output; MIVF: emesis x2, drain 54mL, colostomy 314mL. Net +4.8L GI Imaging:  2/1 Abd Xray - Stable massive pneumoperitoneum Surgeries / Procedures: N/A  Central access: L-IJ triple lumen CVC placed 01/01/21 TPN start date: 01/01/21  Nutritional Goals: (per RD rec on  2/4) 1900-2200kCal, 140-170 gm protein, 1000 mL plus UOP fluid per day Goal TPN rate:  75 ml/hr with SMOF lipids 50g on MWF only - will provide a weekly average of 1940 kCal and 140g protein per day  Current Nutrition:  TPN  Vital 1.5 at goal rate of 55 ml/hr Prosource TID Start clear liquid diet   Plan:  D/C TPN after current bag per discussion with CCM Reduce Lantus to 12 units SQ QHS - further adjustment per CCM Change Protonix to PT D/C TPN labs and nursing care orders  Rolland Steinert D. Mina Marble, PharmD, BCPS, [place] 01/09/2021, 9:41 AM

## 2021-01-09 NOTE — Progress Notes (Signed)
[facility] Progress Note  Subjective: [name] seen in icu no new c/o's  Vitals:   01/09/21 0845 01/09/21 1000 01/09/21 1015 01/09/21 1030  BP:      Pulse: (!) 102 (!) 105 99 (!) 102  Resp: (!) 25 (!) 26 (!) 21 (!) 24  Temp:      TempSrc:      SpO2: 92% 94% 96% 96%  Weight:      Height:       Exam: Gen: frail, chronically ill, +cachectic, lethargic CVS: no MRG Resp: occ rhonchi Abd: +BS, colostomy in LLQ, bandage over incision RLQ Ext: 1-2+ UE > LE edema   RIJ TDC   OP HD: TTS [facility] 66.5kg dry wt per OP HD RN/ R IJ TDC   Assessment/Plan: 1. Ischemic bowel withPneumoperitoneum/ shock -sp exlap, no perforated viscous found.RepeatAbd films showed worsening pneumoperitoneum and [name] went for left colectomy/colostomy 12/19/2020. Path showed diseased tissue w/ hemorrhage and ulceration.  2. ESRD - on HD TTS. As below.  3. Covid positive -off isolation 4. Orthostatic hypotension - chronic issue, [name] is bed-bound 5. BP /volume - +vol overload, unable to UF large amts due to low alb/ 3rd spacing of fluid.  6. DM2 - per primary team  7. Thrombocytopenia 8. EOL - prognosis very poor. [name] w/ severe autonomic dysfunction and bed bound. Now even more severely debilitated w/ recent colectomy/ colostomy surgery. Very poor chance of any meaningful recovery. Further aggressive measures will not improve outcome. Recommend transition to comfort care, no further dialysis. Have d/w patient, pall care and CCM.     Rob Lason [place] 01/09/2021, 10:46 AM   Recent Labs  Lab 01/06/21 1607 01/07/21 0333 01/08/21 0422 01/09/21 0500  K  --  3.1* 3.4* 3.8  BUN  --  93* 78* 110*  CREATININE  --  3.07* 2.68* 3.55*  CALCIUM  --  7.4* 7.3* 7.5*  PHOS  --  2.4* 1.7* 4.0  HGB 7.6* 8.4*  --   --    Inpatient medications: . vitamin C  500 mg Per Tube Daily  . chlorhexidine  15 mL Mouth Rinse BID  . Chlorhexidine Gluconate Cloth  6 each Topical Q0600  .  collagenase   Topical Daily  . feeding supplement (PROSource TF)  90 mL Per Tube TID  . fludrocortisone  0.2 mg Per Tube Daily  . insulin aspart  0-20 Units Subcutaneous Q4H  . insulin glargine  12 Units Subcutaneous QHS  . mouth rinse  15 mL Mouth Rinse q12n4p  . midodrine  20 mg Oral TID WC  . mupirocin ointment   Nasal BID  . [START ON 01/24/2021] pantoprazole sodium  40 mg Per Tube Daily  . sodium chloride flush  10-40 mL Intracatheter Q12H  . zinc sulfate  220 mg Per Tube Daily   . feeding supplement (VITAL 1.5 CAL) 55 mL/hr at 01/08/21 2349  . norepinephrine (LEVOPHED) Adult infusion 4 mcg/min (01/09/21 0922)  . TPN ADULT (ION) 37 mL/hr at 01/08/21 1800   acetaminophen (TYLENOL) oral liquid 160 mg/5 mL, alteplase, heparin, heparin, hydrALAZINE, HYDROmorphone (DILAUDID) injection, ondansetron (ZOFRAN) IV, sodium chloride, sodium chloride flush

## 2021-01-09 NOTE — Progress Notes (Signed)
Patient transferred to [phone] with his friend at the his bedside

## 2021-01-09 NOTE — Progress Notes (Signed)
NAME:  [name], MRN:  [id number], DOB:  January 09, 1951, LOS: 67 ADMISSION DATE:  12/14/2020, CONSULTATION DATE: 12/11/2020 REFERRING MD:  Jesusita Oka, MD, CHIEF COMPLAINT: Acute hypoxic respiratory failure post laparotomy  Brief History:  70 year old male with end-stage renal disease on dialysis TTS, admitted with nausea, vomiting, and abdominal pain, noted to have pneumoperitoneum.  Underwent laparotomy 1/20 which was negative.  Incidentally found to be COVID-positive. On 1/30, his LFTs started to elevate and KUB showed worsening intraperitoneal air.  He was taken back to OR 1/31 for ex lap with end colostomy and left colectomy.  Returns to ICU on two vasopressors and remains intubated secondary to hemodynamic instability.   History of Present Illness:  70 year old male with diabetes, ESRD on HD, TTS, chronic diarrhea, chronic hypotension, PAF - systemic AC on hold w/hx of GIB, admitted 1/19 from SNF with N/V and abd pain found to have pneumoperitoneum.   Prior to this he had prolonged hospitalization 12/3- 1/10  at [facility] with diarrhea, dehydration/ hypotension, deconditioning being bed bound with sacral decub, then previously 11/12- 12/2 with syncope and colitis.   Underwent laparotomy 1/20 without findings of bowel perforation.  Incidentally COVID positive.  He was transferred to [facility] on 1/22.  He was doing better however his LFTs elevated on 1/30 and KUB 1/31 showed worsening intraperitoneal air.  Of note he has previously had multiple ulcers in the sigmoid colon of nonspecific etiology.  He was taken back to the OR today, 1/31 and underwent exploratory laparotomy with end colostomy and left colectomy- noting that entire left colon was boggy thought secondary to ischemia; also noted to have ulcerated area on anterior rectum.  Post operative, he returns to ICU on two vasopressors and remains intubated due to hemodynamic instability. EBL 50 ml.  PCCM re-consulted for ICU management.   Past Medical  History:  End-stage renal disease on dialysis Diabetes type 2 Paroxysmal A. fib Orthostatic hypotension Unstageable sacral decubitus ulcer (POA)  Significant Hospital Events:  1/19>> Admit to [facility] for abdominal pain-pneumoperitoneum on imaging studies- 1/20 exploratory laparotomy negative/ extubated  1/21 iHD, low dose phenyl needed, weaned off after iHD stopped, started on stress dose steroids - got 1 dose and was stopped, resumed home midodrine 1/22>> transferred to [facility] service. 1/26>> plans to discharge back to SNF-however upon further evaluation-needed inpatient hydrotherapy for worsening sacral wound. 1/27>> general surgery consulted for sacral wound-continue hydrotherapy for few more days. 1/31 Back to the OR for Pneumoperitoneum ( Exp. Lap Left colectomy, end colectomy), to ICU for shock post op, remains on vent 2/4 extubated 2/6 off pressors 2/7 did short session of iHD, Afib RVR hypotensive at first attempt 2/8 Pressors back on late afternoon  Consults:  [facility] Nephrology   Procedures:  1/10 right subclavian DL TDC >>  1/20 ex lap >> neg 1/31 ex lap >> end colostomy/ left colectomy   ETT 1/20; 1/31 >> Left radial aline 1/31 >>  Significant Diagnostic Tests:  1/20 CT abdomen and pelvis: There is a focal area of wall thickening with adjacent perforation of the distal descending colon at the junction of the sigmoid colon, likely the transition point. 2. There is moderately dilated sigmoid colon and rectum which is stool-filled and suggestion of possible pneumatosis. 3. Free air seen under the right hemidiaphragm and within the porta hepatis. 4. Small amount of air seen within the inferior right liver lobe which is concerning for portal venous gas given the patient's findings 5. Bilateral sacral decubitus ulceration with  phlegmon and subcutaneous emphysema extending to the inferior coccyx.  1/31 KUB >> Massive pneumoperitoneum again noted. Note that this typically  more than would be expected given laparotomy on 12/03/2020 and an ongoing air leak secondary to visceral perforation should be considered.  Repeat CT imaging with oral contrast may be helpful to identify the source of leak.  Suspected long segment colitis involving the distal descending and sigmoid colon.  2/1 KUB>> stable massive pneumoperitoneum; gastric tube in stomach   Micro Data:  1/20 influenza PCR negative 1/20 COVID PCR positive 1/21 MRSA PCR >> positive  1/31 BCx2 >> NGTD  Antimicrobials:  Zosyn 1/19 > 1/24; 1/31 >> 2/4  Interim History / Subjective:  Having significant pain with hydrotherapy.  Did not tolerate full session of HD yesterday.  Objective   Blood pressure (!) 130/50, pulse (!) 102, temperature 98.6 F (37 C), temperature source Oral, resp. rate (!) 23, height 6\' 2"  (1.88 m), weight 60.5 kg, SpO2 97 %.        Intake/Output Summary (Last 24 hours) at 01/09/2021 1258 Last data filed at 01/09/2021 0600 Gross per 24 hour  Intake 1836.25 ml  Output 411 ml  Net 1425.25 ml   Filed Weights   01/07/21 1440 01/07/21 1730 01/08/21 0427  Weight: 65.6 kg 65.5 kg 60.5 kg   Physical Exam: Chronically ill-appearing, no acute distress Awake alert follows commands and replies to questions Abdomen is open with a left lower quadrant ostomy No respiratory distress  Resolved Hospital Problem list   Large bowel obstruction status post exploratory laparotomy Acute metabolic acidosis COVID 19 infection- off isolation   Assessment & Plan:  Pneumoperitoneum s/p ex-lap resulting in left colectomy with end colostomy  Ischemic bowel - s/p end colostomy and left colectomy 1/31, evidence of ischemic left colon - Management per CCS - no role for additional surgery -TPN per pharmacy started 2/1  - trickle TF 2/4, advance slowly starting 2/7  Circulatory versus septic shock: multifactorial- poor nutrition, chronic hypotension, AI, sedation, rule out intra-abdominal infection given  microperforation/ pneumoperitoneum, sacral ulcer -On low-dose pressors, will titrate down as tolerated - wean stress dose steroids end 2/6 - Resume home florinef 2/7 - trend CBC - follow blood cultures, ngtd - Zosyn for 5 days(ended 2/4) - Home midodrine 10 mg TID - SBP goal > 100 - titrating pressors, NE resumed 2/8  Acute hypoxic respiratory insufficiency post-operatively, previously due to volume overload from missing HD session Open abdomen - Extubated 2/4 - volume removal via dialysis  ESRD on TTS (new to HD since 10/2020) - Dialysis per nephrology - needs to tolerate iHD without incident before can be downgraded - trend renal indices   Delirium: Encephalopathic overnight 2/5, improved on precedex. --precedex, wean as able  Diabetes type 2: Worsened hyperglycemia with stress dose steroids - SSI, lantus 20 u qhs  PAF w/RVR - remains in NSR - PO amiodarone held - hold betablocker - no systemic AC given prior GIB  Sacral wound- un-stageable, present on admission - per WOC/ surgery recommendations, hydrotherapy as tolerated  - no role for OR per surgery  Anemia- multifactorial, of chronic illness, ABLA - transfused 1 unit 1/29 , 1/30, 2/3 - remains on PPI BID - trend CBC - transfuse for Hgb < 7  Thrombocytopenia - suspect sepsis -  Platelets trend stable, continue to monitor/ trend  -  Transfuse for goal plt >10k  Chronic debility/ deconditioning -  mostly bed bound now, PT/ OT  Protein calorie malnutrition>> severe -  TPN per pharmacy  - TF trickle 2/4, increase 2/7  Best practice (evaluated daily)  Diet: NPO, TPN, TF Pain/Anxiety/Delirium protocol (if indicated): n/a VAP protocol (if indicated): n/a DVT prophylaxis: SCDs only  GI prophylaxis: PPI  Glucose control: SSI sensitive Mobility: PT/OT Disposition: needs ICU, but plan is for transition to comfort measures now.   Goals of Care:   Code Status: Limitations, patient is DNR.  Palliative care team  had a talk with the patient's daughter this morning.  She is like to transition to comfort measures.  Patient will transfer to 6N.  Labs   CBC: Recent Labs  Lab 01/03/21 0439 01/03/21 1308 01/04/21 0304 01/04/21 0834 01/05/21 0827 01/05/21 2027 01/06/21 0836 01/06/21 1607 01/07/21 0333  WBC 11.6* 11.9* 12.2*  --  11.2*  --  8.6  --  13.2*  NEUTROABS 10.6*  --  11.0*  --   --   --   --   --  11.4*  HGB 6.3* 8.2* 6.8*   < > 8.2* 8.5* 6.6* 7.6* 8.4*  HCT 19.1* 24.5* 21.3*   < > 25.7* 25.0* 19.8* 23.7* 25.6*  MCV 95.5 93.2 95.5  --  92.8  --  93.0  --  92.1  PLT 36* 38* 32*  --  21*  --  43*  --  65*   < > = values in this interval not displayed.    Basic Metabolic Panel: Recent Labs  Lab 01/05/21 0516 01/05/21 1847 01/05/21 2027 01/06/21 0403 01/07/21 0333 01/08/21 0422 01/09/21 0500  NA 134* 133* 136 136 133* 137 138  K 3.3* 3.4* 3.5 3.3* 3.1* 3.4* 3.8  CL 101 100  --  103 97* 102 99  CO2 19* 19*  --  22 20* 20* 21*  GLUCOSE 261* 298*  --  252* 197* 187* 115*  BUN 56* 48*  --  58* 93* 78* 110*  CREATININE 2.60* 2.11*  --  2.43* 3.07* 2.68* 3.55*  CALCIUM 7.3* 7.4*  --  7.2* 7.4* 7.3* 7.5*  MG 2.0  --   --  2.0 1.9 1.9 1.9  PHOS 1.3* 1.4*  --  1.3* 2.4* 1.7* 4.0   GFR: Estimated Creatinine Clearance: 16.6 mL/min (A) (by C-G formula based on SCr of 3.55 mg/dL (H)). Recent Labs  Lab 01/04/21 0304 01/05/21 0827 01/06/21 0836 01/07/21 0333  WBC 12.2* 11.2* 8.6 13.2*    Liver Function Tests: Recent Labs  Lab 01/03/21 0301 01/03/21 1641 01/04/21 0304 01/04/21 1604 01/05/21 1847 01/06/21 0403 01/07/21 0333 01/08/21 0422 01/09/21 0500  AST 33  --  40  --   --   --  47*  --   --   ALT 61*  --  50*  --   --   --  62*  --   --   ALKPHOS 99  --  98  --   --   --  108  --   --   BILITOT 0.9  --  2.2*  --   --   --  1.6*  --   --   PROT 4.3*  --  4.2*  --   --   --  4.3*  --   --   ALBUMIN 1.6*   < > 1.5*   < > 1.6* 1.4* 1.5* 1.6* 1.5*   < > = values in this  interval not displayed.   No results for input(s): LIPASE, AMYLASE in the last 168 hours. No results for input(s): AMMONIA in the  last 168 hours.  ABG    Component Value Date/Time   PHART 7.546 (H) 01/05/2021 2027   PCO2ART 25.5 (L) 01/05/2021 2027   PO2ART 68 (L) 01/05/2021 2027   HCO3 22.1 01/05/2021 2027   TCO2 23 01/05/2021 2027   ACIDBASEDEF 5.0 (H) 12/16/2020 1751   O2SAT 96.0 01/05/2021 2027     Coagulation Profile: Recent Labs  Lab 01/03/21 0301 01/04/21 0304 01/05/21 0516  INR 1.2 1.3* 1.2    Cardiac Enzymes: No results for input(s): CKTOTAL, CKMB, CKMBINDEX, TROPONINI in the last 168 hours.  HbA1C: Hgb A1c MFr Bld  Date/Time Value Ref Range Status  12/21/2020 12:08 PM 4.1 (L) 4.8 - 5.6 % Final    Comment:    (NOTE) Pre diabetes:          5.7%-6.4%  Diabetes:              >6.4%  Glycemic control for   <7.0% adults with diabetes   06/13/2018 03:16 AM 6.6 (H) 4.8 - 5.6 % Final    Comment:    (NOTE) Pre diabetes:          5.7%-6.4% Diabetes:              >6.4% Glycemic control for   <7.0% adults with diabetes     CBG: Recent Labs  Lab 01/08/21 1931 01/08/21 2335 01/09/21 0017 01/09/21 0335 01/09/21 0734  GLUCAP 93 57* 100* 99 104*    CRITICAL CARE The patient is critically ill due to shock.  They require high complexity decision making for assessment and support, frequent evaluation and titration of therapies, application of advanced monitoring technologies and extensive interpretation of multiple databases.   Critical Care Time devoted to patient care services described in this note is 34 minutes. This time reflects time of care of this Lydia . This critical care time does not reflect separately billable procedures or procedure time, teaching time or supervisory time of PA/NP/Med student/Med Resident etc but could involve care discussion time.  [facility] 01/09/2021 12:58 PM   Pager: see AMION After hours pager: [phone]  If no response to pager , please call [phone] until 7pm After 7:00 pm call Elink  [phone]

## 2021-01-09 NOTE — Progress Notes (Addendum)
9 Days Post-Op  Subjective: CC: Patient talking with palliative care He reports no abdominal pain but having sacral pain. He has nausea and 2 episodes of emesis yesterday that he thinks if from the pain. No abdominal pain or nausea this am. Having colostomy output.   Objective: Vital signs in last 24 hours: Temp:  [98.4 F (36.9 C)-98.7 F (37.1 C)] 98.6 F (37 C) (02/09 0735) Pulse Rate:  [84-109] 102 (02/09 1030) Resp:  [17-30] 24 (02/09 1030) SpO2:  [92 %-100 %] 96 % (02/09 1030) Arterial Line BP: (74-145)/(32-61) 145/61 (02/09 1030) Last BM Date: 01/09/21  Intake/Output from previous day: 02/08 0701 - 02/09 0700 In: 2292.2 [I.V.:1023.1; NG/GT:990; IV Piggyback:279.1] Out: 411 [Emesis/NG output:1; Drains:60; Stool:350] Intake/Output this shift: No intake/output data recorded.  PE: Gen: Awake andalert, cooperative and no distress Lungs: Normal rate and effort  ZO:XWRU, midline wound with some fat necrosis at the lower portion of the wound and serous drainage. There is healthy granulation tissue at the base on wound edges otherwise. Wound appears stable from last picture. No dehiscence or eviseration. Stoma stool stained. Colostomy bag with air and some some liquid brown stool in the bag.+ BS. Mild generalized abdominal tenderness without rigidity or guarding. JP drain w/ SS output.   Lab Results:  Recent Labs    01/06/21 1607 01/07/21 0333  WBC  --  13.2*  HGB 7.6* 8.4*  HCT 23.7* 25.6*  PLT  --  65*   BMET Recent Labs    01/08/21 0422 01/09/21 0500  NA 137 138  K 3.4* 3.8  CL 102 99  CO2 20* 21*  GLUCOSE 187* 115*  BUN 78* 110*  CREATININE 2.68* 3.55*  CALCIUM 7.3* 7.5*   PT/INR No results for input(s): LABPROT, INR in the last 72 hours. CMP     Component Value Date/Time   NA 138 01/09/2021 0500   K 3.8 01/09/2021 0500   CL 99 01/09/2021 0500   CO2 21 (L) 01/09/2021 0500   GLUCOSE 115 (H) 01/09/2021 0500   BUN 110 (H) 01/09/2021 0500    CREATININE 3.55 (H) 01/09/2021 0500   CALCIUM 7.5 (L) 01/09/2021 0500   PROT 4.3 (L) 01/07/2021 0333   ALBUMIN 1.5 (L) 01/09/2021 0500   AST 47 (H) 01/07/2021 0333   ALT 62 (H) 01/07/2021 0333   ALKPHOS 108 01/07/2021 0333   BILITOT 1.6 (H) 01/07/2021 0333   GFRNONAA 18 (L) 01/09/2021 0500   GFRAA 19 (L) 06/15/2018 0407   Lipase     Component Value Date/Time   LIPASE 14 12/13/2020 0040       Studies/Results: No results found.  Anti-infectives: Anti-infectives (From admission, onward)   Start     Dose/Rate Route Frequency Ordered Stop   01/03/21 1300  piperacillin-tazobactam (ZOSYN) IVPB 3.375 g  Status:  Discontinued        3.375 g 12.5 mL/hr over 240 Minutes Intravenous Every 6 hours 01/03/21 1204 01/03/21 1204   01/03/21 1300  piperacillin-tazobactam (ZOSYN) IVPB 3.375 g        3.375 g 100 mL/hr over 30 Minutes Intravenous Every 6 hours 01/03/21 1205 01/05/21 0642   12/30/2020 1600  piperacillin-tazobactam (ZOSYN) IVPB 2.25 g  Status:  Discontinued        2.25 g 100 mL/hr over 30 Minutes Intravenous Every 8 hours 12/21/2020 1507 01/03/21 1204   12/01/2020 1315  piperacillin-tazobactam (ZOSYN) IVPB 3.375 g  Status:  Discontinued        3.375 g 12.5 mL/hr  over 240 Minutes Intravenous To Surgery 12/22/2020 1301 01/01/21 0846   12/25/2020 1500  piperacillin-tazobactam (ZOSYN) IVPB 3.375 g        3.375 g 12.5 mL/hr over 240 Minutes Intravenous Every 12 hours 12/15/2020 1219 12/24/20 2001   12/22/2020 1200  piperacillin-tazobactam (ZOSYN) IVPB 2.25 g  Status:  Discontinued        2.25 g 100 mL/hr over 30 Minutes Intravenous Every 8 hours 12/13/2020 0205 12/06/2020 1219   12/18/2020 0215  piperacillin-tazobactam (ZOSYN) IVPB 3.375 g        3.375 g 100 mL/hr over 30 Minutes Intravenous  Once 12/04/2020 0205 12/10/2020 0435       Assessment/Plan ESRD on dialysis  Hx PAF Covid positive-off restrictions Bedbound Anemia/thrombocytopenia Protein calorie malnutrition- severe (prealbumin<5)>>  9.7 (2/7) Undergoing GOC with palliative   Pneumoperitoneum, pneumatosis, portal venous gas S/p negativeexploratory laparotomy1/20 Dr. Bobbye Morton POD#20 - intraop: no acute findings, no contamination, excellent blood flow to intestine,congested rectosigmoid colon -Worsening pneumoperitoneum S/p Exploratory laparotomy, left colectomy, end colostomy, 12/02/2020 - Dr. Rolm Bookbinder POD #9 - Path w/ severely active chronic nonspecific colitis with extensive ulceration. Negative for granulomata and dysplasia.  - Post-op ileus. Hold TF's. ? If n/v from pain vs true ileus given he is having colostomy output. Continue 1/2 TPN and reassess tomorrow. May need a CT soon if does not improve. Trend labs. Will discuss timing of CT with attending.  - WOCN following for new ostomy - Midline wound BID WTD dressing changes - Completed post op course of abx - Continue JP drain - PT following and recommending SNF - Pulm toilet.   Chronic stage IV sacral decubitus pressure ulcer(12/10/20, first picture [facility]) - Wound evaluated with hydro yesterday. Wound base with mostly granulation tissue. There is some undermining on the left portion of the wound with drainage. Peri wound with some necrosis that hydro is debriding at bedside.May need surgery for this in the future, but given he is not ill/septic from this our team is not recommending OR at this time. Continue Hydro and BID WTD dressing changes. On chart review, the site looks much better than last week. We will see again Thursday or Friday.   ID-Zosyn 1/20-1/25, restart1/31-01/05/21. WBC 13.2 on 2/7. Afebrile.  FEN-NPO. On 1/2 TPN. Hold TF give patient is back on 41mcg Levo and had n/v yesterday.  VTE-SCD's, chemical VTE per CCM. Currently on hold for Anemia/thrombocytopenia   LOS: 20 days    Jillyn Ledger , [facility] 01/09/2021, 10:36 AM Please see Amion for pager number during day hours 7:00am-4:30pm

## 2021-01-09 NOTE — Progress Notes (Signed)
Palliative care brief note  Received call from bedside RN that [name] to remains uncomfortable despite receiving maximum ordered dose of around an hour ago Dilaudid.  He is not due for another dose for at least another 60 minutes.  Overall goal moving forward is for his comfort.  While he may need to be put on a continuous infusion to maintain his comfort at some point, his daughter is wanting to come and visit him (likely tomorrow) and I would prefer we maintain with as needed opioids for now to not cause more sedation as long as we can adequately control his pain with as needed dosing versus continuous infusion.  As overall goal is comfort, however, I want to ensure he has adequate opioids available as needed.  I increased his current as needed dosage to 1 to 2 mg of IV Dilaudid every hour as needed.  Discussed with bedside RN as well as Ihor Dow (who saw patient earlier from the palliative care team).  Micheline Rough, MD [place] [facility] Team [phone]  NO CHARGE NOTE

## 2021-01-09 NOTE — Progress Notes (Signed)
Physical Therapy Wound Treatment Patient Details  Name: [name] MRN: [id number] Date of Birth: Jul 15, 1951  Today's Date: 01/09/2021 Time: 0921-1005 Time Calculation (min): 44 min  Subjective  Subjective: Pt reports continued nausea Patient and Family Stated Goals: heal wound Date of Onset:  (unknown - pt reports at least a month) Prior Treatments: unknown  Pain Score: 10/10  Wound Assessment  Pressure Injury 12/05/20 Coccyx Medial;Upper Unstageable - Full thickness tissue loss in which the base of the injury is covered by slough (yellow, tan, gray, green or brown) and/or eschar (tan, brown or black) in the wound bed. prevous stage 2 on sacrum (Active)  Dressing Type ABD;Barrier Film (skin prep);Gauze (Comment);Moist to dry;Santyl 01/09/21 1235  Dressing Changed;Clean;Dry;Intact 01/09/21 1235  Dressing Change Frequency Twice a day 01/09/21 1235  State of Healing Early/partial granulation 01/09/21 1235  Site / Wound Assessment Pink;Red;Black;Yellow;Painful 01/09/21 1235  % Wound base Red or Granulating 55% 01/05/21 0930  % Wound base Yellow/Fibrinous Exudate 30% 01/05/21 0930  % Wound base Black/Eschar 15% 01/05/21 0930  % Wound base Other/Granulation Tissue (Comment) 0% 01/05/21 0930  Peri-wound Assessment Erythema (blanchable) 01/09/21 1235  Wound Length (cm) 13.5 cm 01/08/21 1056  Wound Width (cm) 10 cm 01/08/21 1056  Wound Depth (cm) 2 cm 01/08/21 1056  Wound Surface Area (cm^2) 135 cm^2 01/08/21 1056  Wound Volume (cm^3) 270 cm^3 01/08/21 1056  Tunneling (cm) 6 cm at 9:00 01/07/21 1247  Undermining (cm) 4-6 cm at 7-11 o clock, 2 cm at 12-6 o clock 01/08/21 1056  Margins Unattached edges (unapproximated) 01/09/21 1235  Drainage Amount Moderate 01/09/21 1235  Drainage Description Serosanguineous;Purulent;Odor 01/09/21 1235  Treatment Hydrotherapy (Pulse lavage);Packing (Saline gauze) 01/09/21 1235     Hydrotherapy Pulsed lavage therapy - wound location: sacrum Pulsed  Lavage with Suction (psi): 4 psi (4-12) Pulsed Lavage with Suction - Normal Saline Used: 1000 mL Pulsed Lavage Tip: Tip with splash shield Selective Debridement Selective Debridement - Location: sacrum Selective Debridement - Tools Used: Scalpel;Forceps Selective Debridement - Tissue Removed: eschar, brown slough, black necrotic edges   Wound Assessment and Plan  Wound Therapy - Assess/Plan/Recommendations Wound Therapy - Clinical Statement: Pt with uncontrolled pain and nausea today with all aspects of treatment including repositioning, pulsatile lavage and dressing change despite medication. Sharp debridement held in light of this. Discussed with RN/MD. Hydrotherapy order d/c'ed at this time due to pt transitioning to comfort care. Wound Therapy - Functional Problem List: global weakness and immobility Factors Delaying/Impairing Wound Healing: Immobility;Multiple medical problems Hydrotherapy Plan: Debridement;Dressing change;Patient/family education;Pulsatile lavage with suction Wound Therapy - Frequency: 6X / week Wound Therapy - Follow Up Recommendations: Skilled nursing facility Wound Plan: [name] d/c'd, but discharged held to continue debridement and management of notable infection.  Wound Therapy Goals- Improve the function of patient's integumentary system by progressing the wound(s) through the phases of wound healing (inflammation - proliferation - remodeling) by: Decrease Necrotic Tissue to: 40 Decrease Necrotic Tissue - Progress: Progressing toward goal Increase Granulation Tissue to: 60 Increase Granulation Tissue - Progress: Progressing toward goal Goals/treatment plan/discharge plan were made with and agreed upon by patient/family: Yes Time For Goal Achievement: 7 days Wound Therapy - Potential for Goals: Poor  Goals will be updated until maximal potential achieved or discharge criteria met.  Discharge criteria: when goals achieved, discharge from hospital, MD  decision/surgical intervention, no progress towards goals, refusal/missing three consecutive treatments without notification or medical reason.  GP  Wyona Almas, PT, DPT Acute Rehabilitation Services Pager [phone] Office [phone]  [name] 01/09/2021, 12:38 PM

## 2021-01-09 NOTE — Progress Notes (Signed)
Spoke with [employer] from Palliative. Pt coretrak can be removed. Can give 1 to 2 mg per hr IV dilaudid as needed for pain and to aid in rest. Will start a drip once pt has had time to talk with daughter.  Pt significant other, [name], is bedside.

## 2021-01-09 NOTE — Evaluation (Signed)
Clinical/Bedside Swallow Evaluation Patient Details  Name: [name] MRN: [id number] Date of Birth: Apr 03, 1951  Today's Date: 01/09/2021 Time: SLP Start Time (ACUTE ONLY): 6759 SLP Stop Time (ACUTE ONLY): 0915 SLP Time Calculation (min) (ACUTE ONLY): 17 min  Past Medical History:  Past Medical History:  Diagnosis Date  . Diabetes mellitus, type 2 ([place])   . ESRD on hemodialysis ([place])   . GI bleed   . Orthostatic hypotension   . PAF (paroxysmal atrial fibrillation) ([place])    Past Surgical History:  Past Surgical History:  Procedure Laterality Date  . BIOPSY  10/17/2020   Procedure: BIOPSY;  Surgeon: Rogene Houston, MD;  [facility];  Service: Endoscopy;;  ascending and descending  . BIOPSY  11/21/2020   Procedure: BIOPSY;  Surgeon: Rogene Houston, MD;  [facility];  Service: Endoscopy;;  . COLECTOMY N/A 12/21/2020   Procedure: LEFT COLECTOMY;  Surgeon: Rolm Bookbinder, MD;  Location: [place];  Service: General;  Laterality: N/A;  . COLONOSCOPY WITH PROPOFOL N/A 10/17/2020   Procedure: COLONOSCOPY WITH PROPOFOL;  Surgeon: Rogene Houston, MD;  [facility];  Service: Endoscopy;  Laterality: N/A;  . COLONOSCOPY WITH PROPOFOL N/A 11/21/2020   Procedure: COLONOSCOPY WITH PROPOFOL;  Surgeon: Rogene Houston, MD;  [facility];  Service: Endoscopy;  Laterality: N/A;  . COLOSTOMY N/A 12/06/2020   Procedure: TRANSVERSE COLOSTOMY;  Surgeon: Rolm Bookbinder, MD;  Location: [place];  Service: General;  Laterality: N/A;  . EXTERNAL FIXATION LEG Right 06/08/2018   Procedure: EXTERNAL FIXATION COMMINUTED FIBULA FRACTURE;  Surgeon: Netta Cedars, MD;  Location: [place];  Service: Orthopedics;  Laterality: Right;  . EXTERNAL FIXATION REMOVAL Right 06/11/2018   Procedure: REMOVAL EXTERNAL FIXATION LEG;  Surgeon: Shona Needles, MD;  Location: [place];  Service: Orthopedics;  Laterality: Right;  . I & D EXTREMITY Right 06/08/2018   Procedure: IRRIGATION AND  DEBRIDEMENT OPEN TIBIA FRACTURE;  Surgeon: Netta Cedars, MD;  Location: [place];  Service: Orthopedics;  Laterality: Right;  . I & D EXTREMITY Right 06/11/2018   Procedure: IRRIGATION AND DEBRIDEMENT EXTREMITY;  Surgeon: Shona Needles, MD;  Location: [place];  Service: Orthopedics;  Laterality: Right;  . INSERTION OF DIALYSIS CATHETER Right 12/07/2020   Procedure: INSERTION OF DIALYSIS CATHETER;  Surgeon: Virl Cagey, MD;  [facility];  Service: General;  Laterality: Right;  Exhange of catheter over wire versus new catheter  . INSERTION OF DIALYSIS CATHETER Right 12/10/2020   Procedure: INSERTION OF DIALYSIS CATHETER;  Surgeon: Virl Cagey, MD;  [facility]: [facility];  Service: General;  Laterality: Right;  . LAPAROTOMY N/A 12/01/2020   Procedure: EXPLORATORY LAPAROTOMY;  Surgeon: Jesusita Oka, MD;  Location: [place];  Service: General;  Laterality: N/A;  . LAPAROTOMY N/A 12/31/2020   Procedure: EXPLORATORY LAPAROTOMY;  Surgeon: Rolm Bookbinder, MD;  Location: [place];  Service: General;  Laterality: N/A;  . NOSE SURGERY    . ORIF ANKLE FRACTURE Right 06/08/2018   Procedure: OPEN REDUCTION INTERNAL FIXATION (ORIF) MEDIAL MALLEOLUS;  Surgeon: Netta Cedars, MD;  Location: [place];  Service: Orthopedics;  Laterality: Right;  . ORIF ANKLE FRACTURE Right 06/11/2018   Procedure: OPEN REDUCTION INTERNAL FIXATION (ORIF) ANKLE FRACTURE;  Surgeon: Shona Needles, MD;  Location: [place];  Service: Orthopedics;  Laterality: Right;  . POLYPECTOMY  11/21/2020   Procedure: POLYPECTOMY;  Surgeon: Rogene Houston, MD;  [facility];  Service: Endoscopy;;  .  TIBIA DEBRIDEMENT Right 06/08/2018   HPI:  Pt is a 70 year old male with end-stage renal disease on dialysis TTS. Pt was admitted secondary to nausea, vomiting, abdominal pain. ED work-up showed pneumoperitoneum/perforation of the distal colon with transition point, patient was seen by general surgery and underwent emergent laparotomy on 1/20  which was negative. Pt found to be COVID-19 positive. Pt was taken back to OR 1/31 for exploratory laparotomy with end colostomy and left colectomy and remained intubated due to hemodynamic instability and extubated 2/4. CT head 2/6: Subacute infarction in the right posterior cerebral artery territory affecting the posteromedial temporal lobe and occipital lobe. CXR 2/2: Persistent bibasilar infiltrates without interim change. Cortrak placed 2/4; SLP consulted 2/8   Assessment / Plan / Recommendation Clinical Impression  Pt was seen for bedside swallow evaluation and he denied a history of dysphagia. Oral mechanism exam was [facility] and dentition adequate. Pt's case was discussed with [name], [place] with surgery who indicated that pt may have solid trials for assessment, but that surgery would like the pt to start on clear liquids. He tolerated puree solids without overt s/sx of aspiration. Coughing was noted thin liquids via cup and straw, suggesting aspiration. However, due to pt's baseline cough, the possibility of coughing being unrelated is also considered. No s/sx of aspiration were noted with nectar thick liquids. Multiple swallows were inconsistently observed, suggesting possible pharyngeal residue. A clear (nectar thick) liquid diet will be initiated at this time. SLP will follow to assess improvement in swallow function and to determine whether an instrumental assessment is warranted. SLP Visit Diagnosis: Dysphagia, unspecified (R13.10)    Aspiration Risk  Mild aspiration risk    Diet Recommendation Nectar-thick liquid (clear liquids)   Liquid Administration via: Cup;Straw Medication Administration: Via alternative means Compensations: Slow rate Postural Changes: Seated upright at 90 degrees    Other  Recommendations Oral Care Recommendations: Oral care BID   Follow up Recommendations  (TBD)      Frequency and Duration min 2x/week  2 weeks       Prognosis Prognosis for Safe Diet  Advancement: Good      Swallow Study   General Date of Onset: 01/04/21 HPI: Pt is a 70 year old male with end-stage renal disease on dialysis TTS. Pt was admitted secondary to nausea, vomiting, abdominal pain. ED work-up showed pneumoperitoneum/perforation of the distal colon with transition point, patient was seen by general surgery and underwent emergent laparotomy on 1/20 which was negative. Pt found to be COVID-19 positive. Pt was taken back to OR 1/31 for exploratory laparotomy with end colostomy and left colectomy and remained intubated due to hemodynamic instability and extubated 2/4. CT head 2/6: Subacute infarction in the right posterior cerebral artery territory affecting the posteromedial temporal lobe and occipital lobe. CXR 2/2: Persistent bibasilar infiltrates without interim change. Cortrak placed 2/4; SLP consulted 2/8 Type of Study: Bedside Swallow Evaluation Previous Swallow Assessment: None Diet Prior to this Study: NPO;NG Tube Temperature Spikes Noted: No Respiratory Status: Nasal cannula History of Recent Intubation: Yes Length of Intubations (days): 4 days Date extubated: 01/04/21 Behavior/Cognition: Alert;Pleasant mood;Cooperative Oral Cavity Assessment: Within Functional Limits Oral Care Completed by SLP: No Vision: Functional for self-feeding Self-Feeding Abilities: Needs assist Patient Positioning: Upright in bed;Postural control adequate for testing Baseline Vocal Quality: Low vocal intensity;Breathy Volitional Cough: Weak Volitional Swallow: Able to elicit    Oral/Motor/Sensory Function Overall Oral Motor/Sensory Function: Within functional limits   Ice Chips Ice chips: Within functional limits Presentation: Spoon   Thin Liquid  Thin Liquid: Impaired Presentation: Straw;Cup Pharyngeal  Phase Impairments: Cough - Immediate;Cough - Delayed;Multiple swallows    Nectar Thick Nectar Thick Liquid: Within functional limits Presentation: Straw   Honey Thick Honey  Thick Liquid: Not tested   Puree Puree: Within functional limits Presentation: Spoon   Solid     Solid: Not tested (pt refused stating "I'm not ready for that yet")     Siddharth Babington I. Hardin Negus, [place], [place] Office number [phone] Pager [place] 01/09/2021,9:24 AM

## 2021-01-10 DIAGNOSIS — E43 Unspecified severe protein-calorie malnutrition: Secondary | ICD-10-CM | POA: Diagnosis not present

## 2021-01-10 DIAGNOSIS — J9690 Respiratory failure, unspecified, unspecified whether with hypoxia or hypercapnia: Secondary | ICD-10-CM | POA: Diagnosis not present

## 2021-01-10 DIAGNOSIS — K668 Other specified disorders of peritoneum: Secondary | ICD-10-CM | POA: Diagnosis not present

## 2021-01-10 DIAGNOSIS — K631 Perforation of intestine (nontraumatic): Secondary | ICD-10-CM | POA: Diagnosis not present

## 2021-01-10 DIAGNOSIS — R109 Unspecified abdominal pain: Secondary | ICD-10-CM

## 2021-01-10 MED ORDER — HALOPERIDOL LACTATE 5 MG/ML IJ SOLN: 2.0000 mg | Freq: Four times a day (QID) | INTRAMUSCULAR | Status: DC | PRN

## 2021-01-10 MED ORDER — HYDROMORPHONE HCL 1 MG/ML IJ SOLN
1.0000 mg | INTRAMUSCULAR | Status: DC
  Filled 2021-01-10: qty 1

## 2021-01-29 NOTE — Progress Notes (Signed)
Nutrition Brief Note  Chart reviewed. Pt now transitioning to comfort care.  No further nutrition interventions warranted at this time.  Please re-consult as needed.   Adar Rase W, RD, LDN, CDCES Registered Dietitian II Certified Diabetes Care and Education Specialist Please refer to [facility] for RD and/or RD on-call/weekend/after hours pager  

## 2021-01-29 NOTE — Progress Notes (Signed)
Brief Palliative Medicine Progress Note:  Received page from primary RN requesting order that coretrak can be removed. Per chart review patient was transitioned to full comfort measures today. With all interventions now aimed at comfort gave verbal orders to remove coretrak.   RN also requested starting dilaudid drip for patient. Per RN patient has received 1mg  IV dilaudid at 6:00pm and is about to receive another 1mg  dose. Per chart review noted that dilaudid had been increased to 1-2mg  q1hr PRN - drip was not started earlier because the goal was to prevent sedation so patient can visit with his daughter tomorrow. RN states they are trying not to administer 2mg  to prevent sedating him too much for visit with family. Discussed with RN that she can administer 2mg  q1hr PRN as ordered - if this makes the patient sleepy and helps him sleep through the night, this is acceptable. Educated that we need to allow time for PRN doses to work to lower any possible breakthrough pain he may be experiencing. I explained we would like for the patient to have as many wakeful moments as he can with his daughter, but if needed for pain control we can consider starting a drip if patient needs multiple 2mg  doses indicating his pain is unmanaged. Instructed RN that if pain is unmanaged after utilizing multiple 2mg  doses within several hours to notify on call provider and a drip can be initiated at that time, otherwise we will try and continue managing with PRN medication. Instructed RN to administer 2mg  dose now instead of 1mg .   Thank you for allowing PMT to assist in the care of this patient.  Sanja Elizardo M. Tamala Julian [facility] Palliative Medicine Team Team Phone: [phone] NO CHARGE

## 2021-01-29 NOTE — Death Summary Note (Signed)
DEATH SUMMARY   Patient Details  Name: [name] MRN: [id number] DOB: [birth year]  Admission/Discharge Information   Admit Date:  12/25/2020  Date of Death:   2021-01-22  Time of Death:  13:58  Length of Stay: 21  Referring Physician: Patient, No Pcp Per   Reason(s) for Hospitalization  Respiratory failure  Diagnoses  Preliminary cause of death: End Stage Renal Disease Secondary Diagnoses (including complications and co-morbidities):  Active Problems:   Free intraperitoneal air   Perforated sigmoid colon (HCC)   Perforated bowel (HCC)   Protein-calorie malnutrition (HCC)   Protein-calorie malnutrition, severe   Acute respiratory failure ([place])   Palliative care by specialist   Brief Hospital Course (including significant findings, care, treatment, and services provided and events leading to death)  [name] wass a 70 y.o. year old male with end-stage renal disease on dialysis TTS, admitted with nausea, vomiting, and abdominal pain, noted to have pneumoperitoneum. On 12/22/2020. He underwent exploratory laparotomy 1/20 which was negative.  Incidentally found to be COVID-positive. On 1/30, his LFTs started to elevate and KUB showed worsening intraperitoneal air.  He was taken back to OR 1/31 for ex lap with end colostomy and left colectomy.  Returns to ICU on two vasopressors and remains intubated secondary to hemodynamic instability. He had a prolonged ICU course where he stopped tolerating HD due to hypotension and was started on steroids for concern for adrenal insufficiency. Also started on midodrine. He was extubated but continued to be hypotensive on pressors. On the afternoon of 01/08/21 he clinically worsened requiring further vasopressor support. He was declining physical therapy and was hypotensive having difficulty tolerating HD. He was also having significant pain with hydrotherapy and wound debridement. He was also having significant autonomic instability and couldn't  tolerate dialysis unless he was in a supine position on a stretcher.  Family meeting had with family, nephrology and palliative care. The patient and his family elected to pursue comfort measures. He expired under comfort measures.     Pertinent Labs and Studies  Significant Diagnostic Studies CT Abdomen Pelvis Wo Contrast  Result Date: 12/28/2020 CLINICAL DATA:  Abdominal pain EXAM: CT ABDOMEN AND PELVIS WITHOUT CONTRAST TECHNIQUE: Multidetector CT imaging of the abdomen and pelvis was performed following the standard protocol without IV contrast. COMPARISON:  November 07, 2020 and October 22, 2020 FINDINGS: Lower chest: The visualized heart size within normal limits. No pericardial fluid/thickening. No hiatal hernia. Small bilateral pleural effusions are seen. Hepatobiliary: A small amount of pneumobilia is seen in the inferior right liver lobe and surrounding the posterior gallbladder. A distended fluid and contrast filled gallbladder is seen, likely due to vicarious contrast excretion. There is small layering gallstones. Pancreas:  Unremarkable.  No surrounding inflammatory changes. Spleen: Normal in size. Although limited due to the lack of intravenous contrast, normal in appearance. Adrenals/Urinary Tract: Both adrenal glands appear normal. Multiple bilateral renal calculi are again identified. The largest within the upper pole of the right kidney measuring 5 mm and the largest within the left kidney measuring 4 mm within the midpole. No hydronephrosis. The bladder is decompressed with a Foley catheter. Stomach/Bowel: Small amount of contrast reflux seen within the distal esophagus. There is mildly prominent contrast filled loops of jejunum seen within the left upper quadrant without a clear transition point the remainder of the small bowel is decompressed. There is been interval increased air and stool-filled dilation of the rectum and sigmoid colon to the level of the descending colon junction. At  the level of the descending colon/sigmoid colon junction there is focal wall thickening and a small amount of free air seen anteriorly, consistent with a probable transition point and perforation. There is air seen surrounding the stool at the level of the sigmoid rectal junction which could be concerning for pneumatosis. Vascular/Lymphatic: Scattered aortic atherosclerosis is noted. Reproductive: The prostate is unremarkable. Other: There is a moderate amount of free air seen under the hemidiaphragms and is seen surrounding the porta hepatis. Musculoskeletal: Bilateral sacral decubitus ulceration seen over the inferior coccyx with debris and foci of air. There is non loculated fluid in this area. Degenerative changes seen throughout the lumbar spine most notable of L2-L3. IMPRESSION: 1. There is a focal area of wall thickening with adjacent perforation of the distal descending colon at the junction of the sigmoid colon, likely the transition point. 2. There is moderately dilated sigmoid colon and rectum which is stool-filled and suggestion of possible pneumatosis. 3. Free air seen under the right hemidiaphragm and within the porta hepatis. 4. Small amount of air seen within the inferior right liver lobe which is concerning for portal venous gas given the patient's findings 5. Bilateral sacral decubitus ulceration with phlegmon and subcutaneous emphysema extending to the inferior coccyx. 6. These results were called by telephone at the time of interpretation on 12/23/2020 at 2:01 am to provider PA Sammy , who verbally acknowledged these results. Electronically Signed   By: Prudencio Pair M.D.   On: 12/02/2020 02:06   DG Abd 1 View  Result Date: 01/01/2021 CLINICAL DATA:  Check gastric catheter placement EXAM: ABDOMEN - 1 VIEW COMPARISON:  12/21/2020 FINDINGS: Nasogastric catheter is again noted within the stomach. Considerable pneumoperitoneum is again seen. The overall appearance is stable from the previous day.  IMPRESSION: Gastric catheter within the stomach. Stable massive pneumoperitoneum. Electronically Signed   By: [place] Catalina M.D.   On: 01/01/2021 19:28   CT HEAD WO CONTRAST  Result Date: 01/06/2021 CLINICAL DATA:  New onset delirium and confusion. EXAM: CT HEAD WITHOUT CONTRAST TECHNIQUE: Contiguous axial images were obtained from the base of the skull through the vertex without intravenous contrast. COMPARISON:  MRI 11/16/2020.  Head CT 01/25/2020. FINDINGS: Brain: The brainstem and cerebellum are normal. There is subacute infarction in the right posterior cerebral artery territory affecting the posteromedial temporal lobe and occipital lobe. Mild swelling but no evidence of mass effect or shift. No hemorrhage. Cerebral hemispheres otherwise show age related atrophy but no second insult. No hydrocephalus or extra-axial collection Vascular: There is atherosclerotic calcification of the major vessels at the base of the brain. Skull: Normal Sinuses/Orbits: Chronic sinusitis with small air-fluid levels in the paranasal sinuses. Orbits negative. Other: None IMPRESSION: Subacute infarction in the right posterior cerebral artery territory affecting the posteromedial temporal lobe and occipital lobe. Mild swelling but no evidence of mass effect or shift. No hemorrhage. Electronically Signed   By: Nelson Chimes M.D.   On: 01/06/2021 00:27   NM GI Blood Loss  Result Date: 12/29/2020 CLINICAL DATA:  GI bleeding EXAM: NUCLEAR MEDICINE GASTROINTESTINAL BLEEDING SCAN TECHNIQUE: Sequential abdominal images were obtained following intravenous administration of Tc-52m labeled red blood cells. RADIOPHARMACEUTICALS:  mCi Tc-49m pertechnetate in-vitro labeled red cells. COMPARISON:  None. FINDINGS: Initial images and demonstrates activity in the cardiac blood pool is well as the liver and spleen. Some background activity is noted throughout the bowel which shows no significant interval change to suggest active GI hemorrhage.  Imaging into the second hour shows no  focal area of increased activity to suggest active significant GI hemorrhage. IMPRESSION: No definitive GI hemorrhage is identified. Electronically Signed   By: [place] Catalina M.D.   On: 12/29/2020 19:11   DG CHEST PORT 1 VIEW  Result Date: 01/02/2021 CLINICAL DATA:  Pneumoperitoneum. EXAM: PORTABLE CHEST 1 VIEW COMPARISON:  Abdomen 01/01/2021.  Chest x-ray 01/01/2021. FINDINGS: Endotracheal tube, NG tube, right dual-lumen central catheter, left IJ line in stable position. Heart size stable. Persistent bibasilar infiltrates without interim change. No pleural effusion or pneumothorax. Prominent pneumonectomy again noted. IMPRESSION: 1. Lines and tubes in stable position. 2. Persistent bibasilar infiltrates without interim change. 3. Prominent pneumoperitoneum again noted. Electronically Signed   By: Marcello Moores  Register   On: 01/02/2021 05:15   DG CHEST PORT 1 VIEW  Result Date: 01/01/2021 CLINICAL DATA:  Central line placement EXAM: PORTABLE CHEST 1 VIEW COMPARISON:  01/01/2021 FINDINGS: Endotracheal tube in good position. Right jugular central venous catheter tip at the cavoatrial junction unchanged. NG side hole in the distal esophagus with tip in the stomach Interval placement of left jugular central venous catheter with the tip in the mid SVC. No pneumothorax. Bibasilar airspace disease right greater than left unchanged. No edema. Pneumoperitoneum unchanged. IMPRESSION: Left jugular central venous catheter tip mid SVC.  No pneumothorax Bibasilar airspace disease unchanged NG side hole distal esophagus Pneumoperitoneum unchanged. Electronically Signed   By: Franchot Gallo M.D.   On: 01/01/2021 16:28   DG Chest Port 1 View  Result Date: 01/01/2021 CLINICAL DATA:  Respiratory failure EXAM: PORTABLE CHEST 1 VIEW.  Patient is rotated. COMPARISON:  Chest x-ray 12/13/2020 FINDINGS: Endotracheal tube with tip approximately 8.5 cm above the carina. Enteric tube coursing below the  hemidiaphragm with tip overlying the gastric lumen and side port overlying the expected region of the distal esophagus. Right dialysis catheter with tip overlying the right atrium. The heart size and mediastinal contours are within normal limits. Interval development of streaky airspace opacity within the right middle lobe. Suggestion of vague patchy airspace opacities within bilateral lungs. No pulmonary edema. No pleural effusion. No pneumothorax. No acute osseous abnormality. Persistent, likely large volume, pneumoperitoneum. IMPRESSION: 1. Endotracheal tube with tip approximately 8.5 cm above the carina. Recommend advancing by 1-2 cm. 2. Enteric tube coursing below the hemidiaphragm with tip overlying the gastric lumen and side port overlying the expected region of the distal esophagus. Recommend advancing by 5 cm. 3. Interval development of streaky airspace opacity within the right middle lobe. 4. Persistent, likely large volume, pneumoperitoneum. Electronically Signed   By: Iven Finn M.D.   On: 01/01/2021 05:12   DG Chest Port 1 View  Result Date: 12/01/2020 CLINICAL DATA:  ETT placement EXAM: PORTABLE CHEST 1 VIEW COMPARISON:  12/18/2020 at 0842 hours FINDINGS: Endotracheal tube terminates 7.5 cm above the carina. Enteric tube courses into the stomach, with its side port at the GE junction. Right I did dual lumen dialysis catheter terminates in the upper right atrium. Mild patchy opacities in the central left upper lobe and bilateral lower lobes, suggesting multifocal pneumonia or aspiration. No definite pleural effusions. No pneumothorax. The heart is normal in size. IMPRESSION: Endotracheal tube terminates 7.5 cm above the carina. Additional support apparatus as above. Suspected multifocal pneumonia or aspiration. Electronically Signed   By: Julian Hy M.D.   On: 12/14/2020 15:59   DG Chest Port 1 View  Result Date: 12/28/2020 CLINICAL DATA:  Dyspnea EXAM: PORTABLE CHEST 1 VIEW  COMPARISON:  12/03/2020 FINDINGS: Interval extubation. Nasogastric tube  is been removed. Pulmonary insufflation has diminished slightly. There is interval development of bilateral perihilar and lower lung zone airspace infiltrate, likely infectious or inflammatory. No pneumothorax or pleural effusion. Right internal jugular hemodialysis catheter tip noted within the right atrium. Cardiac size within normal limits. The pulmonary vascularity is normal. Massive pneumoperitoneum is again identified below the hemidiaphragm. IMPRESSION: Interval extubation.  Diminished pulmonary insufflation. Interval development of bilateral mid and lower lung zone airspace infiltrate, likely infectious or inflammatory. Massive pneumoperitoneum again noted. Electronically Signed   By: Fidela Salisbury MD   On: 12/15/2020 09:05   DG CHEST PORT 1 VIEW  Result Date: 12/28/2020 CLINICAL DATA:  Intubation EXAM: PORTABLE CHEST 1 VIEW COMPARISON:  Ten days ago FINDINGS: Endotracheal tube with tip at the clavicular heads. The enteric tube reaches the stomach with side port near the GE junction. Dialysis catheter with tips at the upper right atrium. Large volume pneumoperitoneum, known prior CT. Hazy density at the lung bases from pleural fluid primarily based on CT. IMPRESSION: 1. Enteric tube side-port is at the GE junction. Otherwise unremarkable hardware. 2. Hazy density at the bases from small pleural effusions by prior CT. 3. Pneumoperitoneum as seen on prior CT. Electronically Signed   By: Monte Fantasia M.D.   On: 12/08/2020 07:59   DG Abd 2 Views  Result Date: 12/29/2020 CLINICAL DATA:  Free air EXAM: ABDOMEN - 2 VIEW COMPARISON:  None. FINDINGS: Massive pneumoperitoneum persists. There is a large amount of stool visible in the right lower quadrant. No dilated small bowel. IMPRESSION: 1. Persistent massive pneumoperitoneum. No visible small bowel dilatation. 2. Large amount of stool in the right lower quadrant. Electronically  Signed   By: Ulyses Jarred M.D.   On: 12/29/2020 00:59   DG Abd Portable 1V  Result Date: 12/24/2020 CLINICAL DATA:  Constipation EXAM: PORTABLE ABDOMEN - 1 VIEW COMPARISON:  12/29/2020 FINDINGS: The subdiaphragmatic region is excluded from view. Massive pneumoperitoneum is again identified. Laparotomy skin staples overlie the mid abdomen. Note that the degree of pneumoperitoneum is more than would be typically expected following laparotomy on 12/28/2020. Moderate stool is seen throughout the colon and within the rectal vault. There is loss of typical how Stroll markings involving the a descending and sigmoid colon with apparent wall thickening of the bowel in keeping with a long segment colitis within this region. IMPRESSION: Massive pneumoperitoneum again noted. Note that this typically more than would be expected given laparotomy on 12/11/2020 and an ongoing air leak secondary to visceral perforation should be considered. Repeat CT imaging with oral contrast may be helpful to identify the source of leak. Suspected long segment colitis involving the distal descending and sigmoid colon. Electronically Signed   By: Fidela Salisbury MD   On: 12/16/2020 09:09   US Abdomen Limited RUQ (LIVER/GB)  Result Date: 12/30/2020 CLINICAL DATA:  Transaminitis. EXAM: ULTRASOUND ABDOMEN LIMITED RIGHT UPPER QUADRANT COMPARISON:  None. FINDINGS: Gallbladder: Gallbladder is distended and filled with sludge. Borderline gallbladder wall thickening. No sonographic Murphy sign elicited. Common bile duct: Diameter: 4 mm Liver: No focal lesion identified. Diffusely echogenic indicating fatty infiltration. Portal vein is patent on color Doppler imaging with normal direction of blood flow towards the liver. Other: RIGHT upper quadrant ascites.  RIGHT-sided pleural effusion. IMPRESSION: 1. Gallbladder is distended and filled with sludge. No convincing sonographic evidence of acute cholecystitis. 2. No bile duct dilatation. 3. Fatty  infiltration of the liver. 4. Ascites. Electronically Signed   By: Roxy Horseman.D.  On: 12/30/2020 15:46    Microbiology Recent Results (from the past 240 hour(s))  Culture, blood (routine x 2)     Status: None   Collection Time: 12/24/2020  3:50 PM   Specimen: BLOOD  Result Value Ref Range Status   Specimen Description BLOOD SITE NOT SPECIFIED  Final   Special Requests   Final    BOTTLES DRAWN AEROBIC ONLY Blood Culture results may not be optimal due to an inadequate volume of blood received in culture bottles   Culture   Final    NO GROWTH 5 DAYS Performed at [facility], [place] [address], [place], [place] [address]    Report Status 01/06/2021 FINAL  Final  Culture, blood (routine x 2)     Status: None   Collection Time: 01/01/21  1:33 AM   Specimen: BLOOD  Result Value Ref Range Status   Specimen Description BLOOD RIGHT HAND  Final   Special Requests   Final    BOTTLES DRAWN AEROBIC ONLY Blood Culture adequate volume   Culture   Final    NO GROWTH 5 DAYS Performed at [facility], [place] [address] [place], [place] [address]    Report Status 01/06/2021 FINAL  Final    Lab Basic Metabolic Panel: Recent Labs  Lab 01/05/21 0516 01/05/21 1847 01/05/21 2027 01/06/21 0403 01/07/21 0333 01/08/21 0422 01/09/21 0500  NA 134* 133* 136 136 133* 137 138  K 3.3* 3.4* 3.5 3.3* 3.1* 3.4* 3.8  CL 101 100  --  103 97* 102 99  CO2 19* 19*  --  22 20* 20* 21*  GLUCOSE 261* 298*  --  252* 197* 187* 115*  BUN 56* 48*  --  58* 93* 78* 110*  CREATININE 2.60* 2.11*  --  2.43* 3.07* 2.68* 3.55*  CALCIUM 7.3* 7.4*  --  7.2* 7.4* 7.3* 7.5*  MG 2.0  --   --  2.0 1.9 1.9 1.9  PHOS 1.3* 1.4*  --  1.3* 2.4* 1.7* 4.0   Liver Function Tests: Recent Labs  Lab 01/04/21 0304 01/04/21 1604 01/05/21 1847 01/06/21 0403 01/07/21 0333 01/08/21 0422 01/09/21 0500  AST 40  --   --   --  47*  --   --   ALT 50*  --   --   --  62*  --   --   ALKPHOS 98  --   --   --  108  --   --    BILITOT 2.2*  --   --   --  1.6*  --   --   PROT 4.2*  --   --   --  4.3*  --   --   ALBUMIN 1.5*   < > 1.6* 1.4* 1.5* 1.6* 1.5*   < > = values in this interval not displayed.   No results for input(s): LIPASE, AMYLASE in the last 168 hours. No results for input(s): AMMONIA in the last 168 hours. CBC: Recent Labs  Lab 01/04/21 0304 01/04/21 0834 01/05/21 0827 01/05/21 2027 01/06/21 0836 01/06/21 1607 01/07/21 0333  WBC 12.2*  --  11.2*  --  8.6  --  13.2*  NEUTROABS 11.0*  --   --   --   --   --  11.4*  HGB 6.8*   < > 8.2* 8.5* 6.6* 7.6* 8.4*  HCT 21.3*   < > 25.7* 25.0* 19.8* 23.7* 25.6*  MCV 95.5  --  92.8  --  93.0  --  92.1  PLT 32*  --  21*  --  43*  --  65*   < > = values in this interval not displayed.   Cardiac Enzymes: No results for input(s): CKTOTAL, CKMB, CKMBINDEX, TROPONINI in the last 168 hours. Sepsis Labs: Recent Labs  Lab 01/04/21 0304 01/05/21 0827 01/06/21 0836 01/07/21 0333  WBC 12.2* 11.2* 8.6 13.2*

## 2021-01-29 NOTE — Progress Notes (Signed)
Patient noted unresponsive, no respirations, and no pulse, a DNR and on comfort cares only, expired at 13:52. Family was at bedside at time of death, MD is aware and [facility] was also notified.

## 2021-01-29 NOTE — Progress Notes (Signed)
This chaplain is present for F/U spiritual care and learned of the Pt. passing.  The Pt. daughter-[name] and sister-Sheila are bedside.  The chaplain understands Donella Stade has updated family and are not expecting visitors.  This chaplain assisted the Pt. RN-Joan in gathering contact information and name of the Pt. funeral home.

## 2021-01-29 NOTE — Progress Notes (Signed)
NAME:  [name], MRN:  [id number], DOB:  12/07/1950, LOS: 21 ADMISSION DATE:  12/18/2020, CONSULTATION DATE: 12/06/2020 REFERRING MD:  Jesusita Oka, MD, CHIEF COMPLAINT: Acute hypoxic respiratory failure post laparotomy  Brief History:  70 year old male with end-stage renal disease on dialysis TTS, admitted with nausea, vomiting, and abdominal pain, noted to have pneumoperitoneum.  Underwent laparotomy 1/20 which was negative.  Incidentally found to be COVID-positive. On 1/30, his LFTs started to elevate and KUB showed worsening intraperitoneal air.  He was taken back to OR 1/31 for ex lap with end colostomy and left colectomy.  Returns to ICU on two vasopressors and remains intubated secondary to hemodynamic instability.   History of Present Illness:  70 year old male with diabetes, ESRD on HD, TTS, chronic diarrhea, chronic hypotension, PAF - systemic AC on hold w/hx of GIB, admitted 1/19 from SNF with N/V and abd pain found to have pneumoperitoneum.   Prior to this he had prolonged hospitalization 12/3- 1/10  at [facility] with diarrhea, dehydration/ hypotension, deconditioning being bed bound with sacral decub, then previously 11/12- 12/2 with syncope and colitis.   Underwent laparotomy 1/20 without findings of bowel perforation.  Incidentally COVID positive.  He was transferred to [facility] on 1/22.  He was doing better however his LFTs elevated on 1/30 and KUB 1/31 showed worsening intraperitoneal air.  Of note he has previously had multiple ulcers in the sigmoid colon of nonspecific etiology.  He was taken back to the OR today, 1/31 and underwent exploratory laparotomy with end colostomy and left colectomy- noting that entire left colon was boggy thought secondary to ischemia; also noted to have ulcerated area on anterior rectum.  Post operative, he returns to ICU on two vasopressors and remains intubated due to hemodynamic instability. EBL 50 ml.  PCCM re-consulted for ICU management.   Past Medical  History:  End-stage renal disease on dialysis Diabetes type 2 Paroxysmal A. fib Orthostatic hypotension Unstageable sacral decubitus ulcer (POA)  Significant Hospital Events:  1/19>> Admit to [facility] for abdominal pain-pneumoperitoneum on imaging studies- 1/20 exploratory laparotomy negative/ extubated  1/21 iHD, low dose phenyl needed, weaned off after iHD stopped, started on stress dose steroids - got 1 dose and was stopped, resumed home midodrine 1/22>> transferred to [facility] service. 1/26>> plans to discharge back to SNF-however upon further evaluation-needed inpatient hydrotherapy for worsening sacral wound. 1/27>> general surgery consulted for sacral wound-continue hydrotherapy for few more days. 1/31 Back to the OR for Pneumoperitoneum ( Exp. Lap Left colectomy, end colectomy), to ICU for shock post op, remains on vent 2/4 extubated 2/6 off pressors 2/7 did short session of iHD, Afib RVR hypotensive at first attempt 2/8 Pressors back on late afternoon 2/9 transitioned to comfort care Consults:  [facility] Nephrology   Procedures:  1/10 right subclavian DL TDC >>  1/20 ex lap >> neg 1/31 ex lap >> end colostomy/ left colectomy   ETT 1/20; 1/31 >>2/4 Left radial aline 1/31 >> out  Significant Diagnostic Tests:  1/20 CT abdomen and pelvis: There is a focal area of wall thickening with adjacent perforation of the distal descending colon at the junction of the sigmoid colon, likely the transition point. 2. There is moderately dilated sigmoid colon and rectum which is stool-filled and suggestion of possible pneumatosis. 3. Free air seen under the right hemidiaphragm and within the porta hepatis. 4. Small amount of air seen within the inferior right liver lobe which is concerning for portal venous gas given the patient's findings 5.  Bilateral sacral decubitus ulceration with phlegmon and subcutaneous emphysema extending to the inferior coccyx.  1/31 KUB >> Massive pneumoperitoneum  again noted. Note that this typically more than would be expected given laparotomy on 12/23/2020 and an ongoing air leak secondary to visceral perforation should be considered.  Repeat CT imaging with oral contrast may be helpful to identify the source of leak.  Suspected long segment colitis involving the distal descending and sigmoid colon.  2/1 KUB>> stable massive pneumoperitoneum; gastric tube in stomach   Micro Data:  1/20 influenza PCR negative 1/20 COVID PCR positive 1/21 MRSA PCR >> positive  1/31 BCx2 >> NGTD  Antimicrobials:  Zosyn 1/19 > 1/24; 1/31 >> 2/4  Interim History / Subjective:   Transition to comfort care moved to the floor. On intermittent Dilaudid Girlfriend at bedside  Objective   Blood pressure (!) 116/95, pulse 91, temperature 97.6 F (36.4 C), temperature source Oral, resp. rate 15, height 6\' 2"  (1.88 m), weight 60.5 kg, SpO2 (!) 86 %.        Intake/Output Summary (Last 24 hours) at 02/08/21 1007 Last data filed at 01/09/2021 1823 Gross per 24 hour  Intake 91.05 ml  Output 1055 ml  Net -963.95 ml   Filed Weights   01/07/21 1440 01/07/21 1730 01/08/21 0427  Weight: 65.6 kg 65.5 kg 60.5 kg   Physical Exam: Chronically ill-appearing, no visible distress Just received Dilaudid No accessory muscle use  Resolved Hospital Problem list   Large bowel obstruction status post exploratory laparotomy Acute metabolic acidosis COVID 19 infection- off isolation   Assessment & Plan:  Pneumoperitoneum s/p ex-lap resulting in left colectomy with end colostomy  Ischemic bowel - s/p end colostomy and left colectomy 1/31, evidence of ischemic left colon -  per CCS - no role for additional surgery -DC TPN and trickle feeds  Circulatory versus septic shock: multifactorial- poor nutrition, chronic hypotension, AI, sedation, rule out intra-abdominal infection given microperforation/ pneumoperitoneum, sacral ulcer -Pressors, Florinef and antibiotics  discontinued  Acute hypoxic respiratory insufficiency post-operatively, previously due to volume overload from missing HD session Open abdomen - Extubated 2/4   ESRD on TTS (new to HD since 10/2020) -Dialysis stopped   Goals of Care:   Code Status: He has transition to comfort care.  Dilaudid as needed seems to be adequate.  Low threshold to start drip if needed for comfort.  His daughter is to visit today so trying to avoid oversedation but clearly comfort primary.  Expect him to pass in the hospital over the next 2days residential hospice may not be necessary  Labs   CBC: Recent Labs  Lab 01/03/21 1308 01/04/21 0304 01/04/21 0834 01/05/21 0827 01/05/21 2027 01/06/21 0836 01/06/21 1607 01/07/21 0333  WBC 11.9* 12.2*  --  11.2*  --  8.6  --  13.2*  NEUTROABS  --  11.0*  --   --   --   --   --  11.4*  HGB 8.2* 6.8*   < > 8.2* 8.5* 6.6* 7.6* 8.4*  HCT 24.5* 21.3*   < > 25.7* 25.0* 19.8* 23.7* 25.6*  MCV 93.2 95.5  --  92.8  --  93.0  --  92.1  PLT 38* 32*  --  21*  --  43*  --  65*   < > = values in this interval not displayed.    Basic Metabolic Panel: Recent Labs  Lab 01/05/21 0516 01/05/21 1847 01/05/21 2027 01/06/21 0403 01/07/21 0333 01/08/21 0422 01/09/21 0500  NA 134*  133* 136 136 133* 137 138  K 3.3* 3.4* 3.5 3.3* 3.1* 3.4* 3.8  CL 101 100  --  103 97* 102 99  CO2 19* 19*  --  22 20* 20* 21*  GLUCOSE 261* 298*  --  252* 197* 187* 115*  BUN 56* 48*  --  58* 93* 78* 110*  CREATININE 2.60* 2.11*  --  2.43* 3.07* 2.68* 3.55*  CALCIUM 7.3* 7.4*  --  7.2* 7.4* 7.3* 7.5*  MG 2.0  --   --  2.0 1.9 1.9 1.9  PHOS 1.3* 1.4*  --  1.3* 2.4* 1.7* 4.0    Kara Mead MD. FCCP.  Pulmonary & Critical care Pager : [phone]  If no response to pager , please call [phone] until 7 pm After 7:00 pm call Elink  [phone]   January 31, 2021

## 2021-01-29 NOTE — Progress Notes (Signed)
Daily Progress Note   Patient Name: [name]       Date: Feb 01, 2021 DOB: August 13, 1951  Age: 70 y.o. MRN#: [id number] Attending Physician: Spero Geralds, MD Primary Care Physician: Patient, No Pcp Per Admit Date: 12/29/2020   Reason for Consultation/Follow-up: Establishing goals of care and Terminal Care  Subjective:  Patient wakes to voice. Recently given prn dilaudid for pain and reports relief. Does not engage in conversation but appears comfortable without pain or distress.   GOC:  GF Robin at bedside. She spent the night and shares that he had a peaceful night. PRN dilaudid is helping manage pain. Discussed comfort focused care plan and symptom management medications. Answered questions. Daughter, Donella Stade is on the way from the beach. Family has PMT contact information and knows to call with questions or concerns.  Discussed with RN and Dr. Elsworth Soho.  Length of Stay: 21  Current Medications: Scheduled Meds:  . chlorhexidine  15 mL Mouth Rinse BID  .  HYDROmorphone (DILAUDID) injection  1 mg Intravenous Q4H  . mouth rinse  15 mL Mouth Rinse q12n4p  . mupirocin ointment   Nasal BID  . sodium chloride flush  10-40 mL Intracatheter Q12H    Continuous Infusions:   PRN Meds: acetaminophen (TYLENOL) oral liquid 160 mg/5 mL, alteplase, glycopyrrolate, haloperidol lactate, hydrALAZINE, HYDROmorphone (DILAUDID) injection, LORazepam, ondansetron (ZOFRAN) IV, sodium chloride flush  Physical Exam Vitals and nursing note reviewed.  Constitutional:      Appearance: He is cachectic. He is ill-appearing.  HENT:     Head: Normocephalic and atraumatic.  Cardiovascular:     Rate and Rhythm: Normal rate.     Comments: NSR Pulmonary:     Effort: No tachypnea, accessory muscle usage or  respiratory distress.     Breath sounds: Decreased breath sounds present.  Abdominal:     Comments: Midline incision C/D/I. Colostomy   Skin:    General: Skin is warm and dry.  Neurological:     Mental Status: He is alert, oriented to person, place, and time and easily aroused.     Comments: Drowsy, weak            Vital Signs: BP (!) 116/95 (BP Location: Right Arm)   Pulse 91   Temp 97.6 F (36.4 C) (Oral)   Resp 15  Ht 6\' 2"  (1.88 m)   Wt 60.5 kg   SpO2 (!) 86%   BMI 17.12 kg/m  SpO2: SpO2: (!) 86 % O2 Device: O2 Device: Room Air O2 Flow Rate: O2 Flow Rate (L/min): 2 L/min  Intake/output summary:   Intake/Output Summary (Last 24 hours) at 01/25/2021 9924 Last data filed at 01/09/2021 1823 Gross per 24 hour  Intake 206.89 ml  Output 1055 ml  Net -848.11 ml   LBM: Last BM Date: 01/09/21 Baseline Weight: Weight: 61.2 kg Most recent weight: Weight: 60.5 kg       Palliative Assessment/Data: PPS 20%   Flowsheet Rows   Flowsheet Row Most Recent Value  Intake Tab   Referral Department Critical care  Unit at Time of Referral ICU  Palliative Care Primary Diagnosis Sepsis/Infectious Disease  Palliative Care Type Return patient Palliative Care  Reason for referral Clarify Goals of Care  Date first seen by Palliative Care 01/03/21  Clinical Assessment   Palliative Performance Scale Score 20%  Psychosocial & Spiritual Assessment   Palliative Care Outcomes   Patient/Family meeting held? Yes  Who was at the meeting? patient, daughter, gf  Palliative Care Outcomes Clarified goals of care, Provided end of life care assistance, Counseled regarding hospice, Improved non-pain symptom therapy, Improved pain interventions, Provided psychosocial or spiritual support, Changed to focus on comfort, Changed CPR status, ACP counseling assistance      Patient Active Problem List   Diagnosis Date Noted  . Acute respiratory failure ([place])   . Palliative care by specialist   .  Protein-calorie malnutrition, severe 01/02/2021  . Protein-calorie malnutrition ([place])   . Free intraperitoneal air 12/10/2020  . Perforated sigmoid colon ([place]) 12/04/2020  . Perforated bowel ([place]) 12/08/2020  . Problem with dialysis access ([place])   . Complication of vascular dialysis catheter   . Goals of care, counseling/discussion 11/29/2020  . Abdominal distension   . Colitis   . ESRD on hemodialysis ([place])   . Syncope due to orthostatic hypotension 11/02/2020  . Rectal bleeding   . Acute on chronic anemia   . Diarrhea   . Orthostatic hypotension 10/13/2020  . Chronic diarrhea 10/13/2020  . Colitis presumed infectious 10/13/2020  . Pressure injury of skin 10/13/2020  . ESRD on dialysis ([place]) 10/12/2020  . Unspecified atrial fibrillation ([place]) 10/12/2020  . Syncope 10/12/2020  . Small bowel obstruction ([place])   . Type 2 diabetes mellitus ([place]) 06/11/2018  . Peripheral neuropathy 06/11/2018  . Ankle syndesmosis disruption, right, initial encounter 06/11/2018  . AKI (acute kidney injury) ([place])   . Dehydration   . Open right ankle fracture 06/08/2018  . Fracture, ankle 06/08/2018  . Open displaced fracture of medial malleolus of tibia, type III 06/08/2018    Palliative Care Assessment & Plan   Patient Profile:  70 y.o. male  with past medical history of ESRD on hemodialysis, paroxysmal atrial fibrillation, orthostatic hypotension, GI bleeding, DM type 2. Recent prolonged hospitalization 12/3-1/10 at [facility] with diarrhea, dehydration, sacral decubitus/bed bound status. Admitted on 12/21/2020 with nausea/vomiting/abdominal pain. Found to have pneumoperitoneum. Underwent laparotomy 12/16/2020 which was negative. Found to be COVID positive. 1/30, LFT's increasing and KUB revealed worsening intraperitoneal air. Back to OR on 1/31 for exploratory lap with evidence of ischemic left colon, resulting in colostomy and left colectomy. Returned to ICU requiring ongoing intubation/mechanical ventilation,  two vasopressors, and currently on CRRT. Severe protein calorie malnutrition on TPN. Palliative medicine consultation for goals of care.     2/4 Successfully extubated to  nasal cannula. Levo off. Patient awake, alert, oriented and able to participate in [place] discussion.   2/7 Patient stable off ventilator. On room air. Off CRRT and will attempt iHD today. Trickle feeds at 20/hr. +BS and some liquid stool noted in colostomy.   2/9 Patient doing poorly. Poorly tolerated dialysis on Monday. Back on levo. Increasing pain and nausea. After [place] discussion with patient, daughter, and nephrology decision made for transition to comfort measures.   Assessment: Pneumoperitoneum s/p ex-lap, left colectomy with colostomy Ischemic bowel Chronic stage IV sacral decubitus pressure ulcer Septic shock, resolved Chronic hypotension, autonomic dysfunction ESRD on hemodialysis Acute hypoxic respiratory failure, resolved PAF with RVR Delirium Anemia Thrombocytopenia Chronic debility/deconditioning Severe protein calorie malnutrition   Recommendations/Plan:  Transition to comfort measures only on 01/09/21 following GOC discussion with patient, daughter and Dr. Jonnie Finner.   NO further dialysis. Discontinue interventions not aimed at comfort.   Clear liquid diet initiated. Allow comfort sips/bites.  Unrestricted visitor access.   Appreciate ongoing chaplain support and prayer.    Scheduled IV dilaudid for pain management. Continue prn comfort meds on MAR.   Wound care as needed.  Anticipate hospital death vs. Hospice discussions with family if appropriate.  Code Status: DNR/DNI   Code Status Orders  (From admission, onward)         Start     Ordered   01/04/21 1358  Limited resuscitation (code)  Continuous       Question Answer Comment  In the event of cardiac or respiratory ARREST: Initiate Code Blue, Call Rapid Response Yes   In the event of cardiac or respiratory ARREST: Perform CPR No    In the event of cardiac or respiratory ARREST: Perform Intubation/Mechanical Ventilation Yes   In the event of cardiac or respiratory ARREST: Use NIPPV/BiPAp only if indicated Yes   In the event of cardiac or respiratory ARREST: Administer ACLS medications if indicated Yes   In the event of cardiac or respiratory ARREST: Perform Defibrillation or Cardioversion if indicated Yes   Comments NO CPR per patient wishes      01/04/21 1357        Code Status History    Date Active Date Inactive Code Status Order ID Comments User Context   12/01/2020 0807 01/04/2021 1357 Full Code [id number]  Jacky Kindle, MD Inpatient   12/10/2020 0651 12/05/2020 0806 Full Code [id number]  Omar Person, NP Inpatient   11/02/2020 1521 12/11/2020 1526 Full Code [id number]  Rodena Goldmann, DO Inpatient   10/12/2020 2159 11/02/2020 0136 Full Code [id number]  Elgergawy, Silver Huguenin, MD ED   06/08/2018 2018 06/15/2018 2016 Full Code [id number]  Sherene Sires, DO Inpatient   06/08/2018 1524 06/08/2018 2017 Full Code [id number]  [facility]   Advance Care Planning Activity       Prognosis:  Likely days  Discharge Planning:  To Be Determined: hospital death vs. Further hospice discussions if appropriate  Care plan was discussed with patient, gf [name]), RN, Dr. Elsworth Soho  Thank you for allowing the Palliative Medicine Team to assist in the care of this patient.   Total Time 20 Prolonged Time Billed  no    Greater than 50% of this time was spent counseling and coordinating care related to the above assessment and plan.   Ihor Dow, DNP, FNP-C Palliative Medicine Team  Phone: [phone] Fax: [phone]  Please contact Palliative Medicine Team phone at [phone] for questions and concerns.

## 2021-01-29 DEATH — deceased

## 2021-02-13 ENCOUNTER — Encounter (HOSPITAL_COMMUNITY): Payer: Self-pay | Admitting: Nephrology

## 2021-02-13 LAB — SURGICAL PATHOLOGY

## 2021-11-16 IMAGING — CT CT ABD-PELV W/O CM
2 of 4 series · 15 of 46 positions shown, 17 images · non-contrast
Comparison: 06/13/2018

CLINICAL DATA: 15 pound weight loss in 2 weeks. Nausea and
vomiting.

EXAM:
CT ABDOMEN AND PELVIS WITHOUT CONTRAST
TECHNIQUE: Multidetector CT imaging of the abdomen and pelvis was performed
following the standard protocol without IV contrast.

[Series 2: axial st · axial · 0.72mm/px · z∈[+1035,+1475]mm · 12 of 104 slices shown, 14 images]
[im 8/104  soft-tissue]
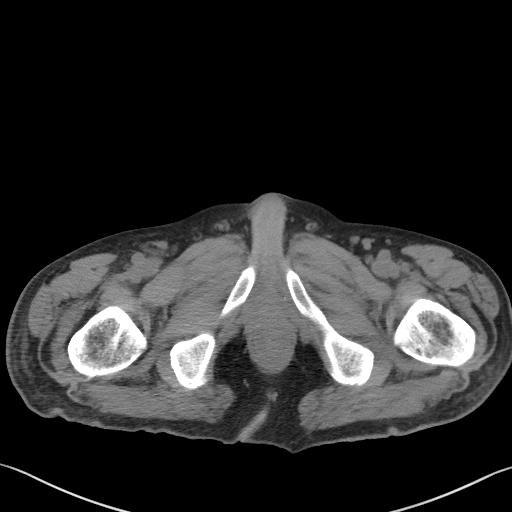
[im 8/104  bone]
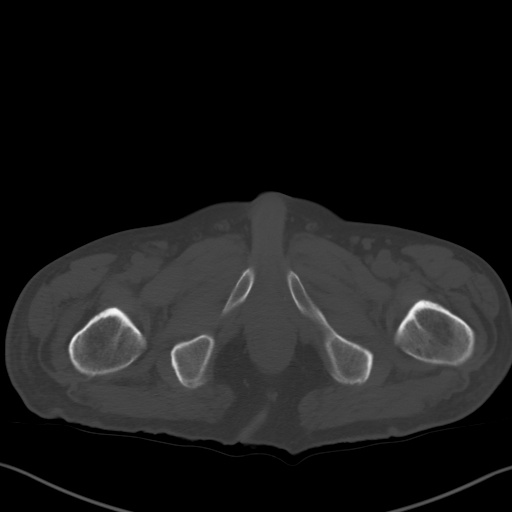
[im 16/104  soft-tissue]
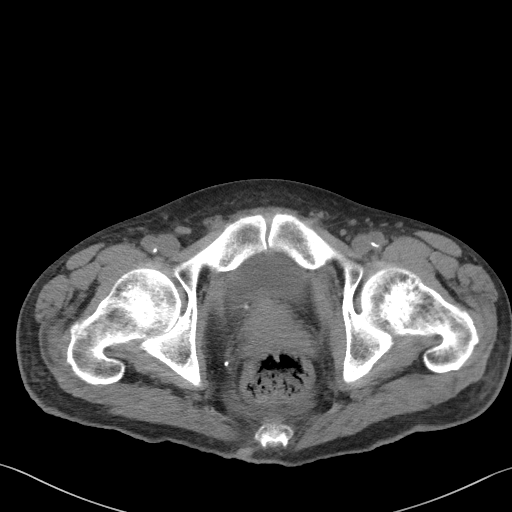
[im 24/104  soft-tissue]
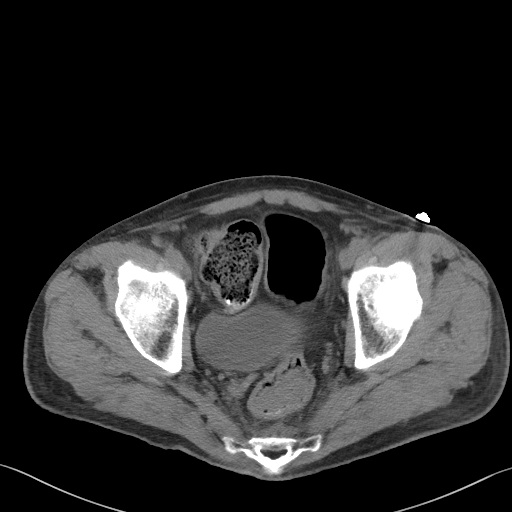
[im 32/104  soft-tissue]
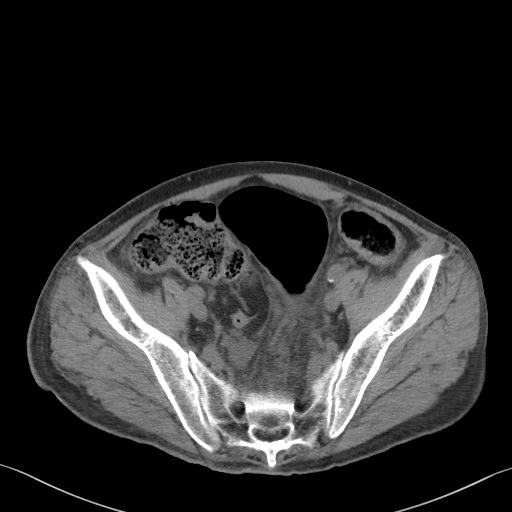
[im 40/104  soft-tissue]
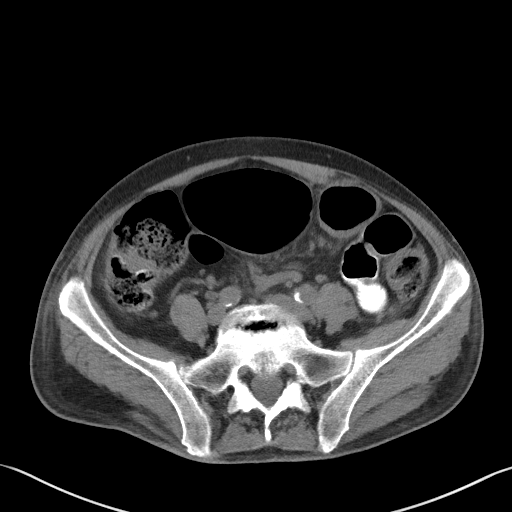
[im 48/104  soft-tissue]
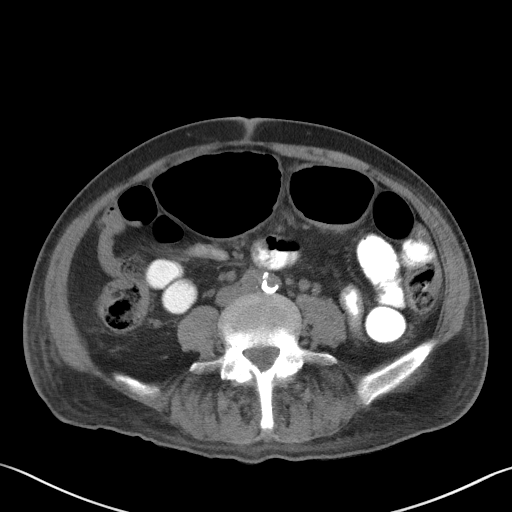
[im 56/104  soft-tissue]
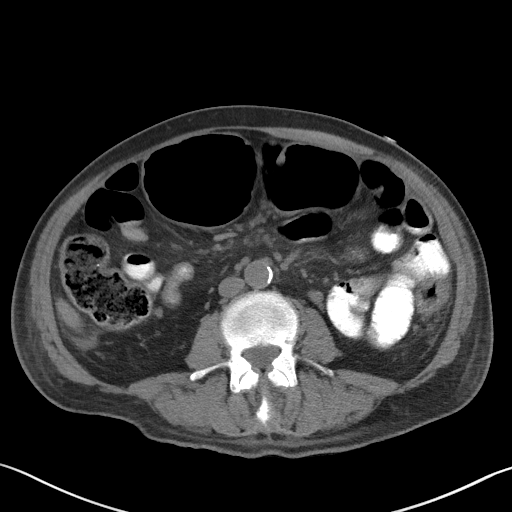
[im 64/104  soft-tissue]
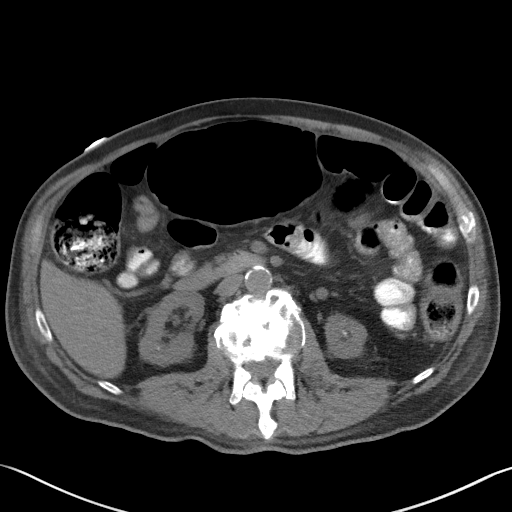
[im 72/104  soft-tissue]
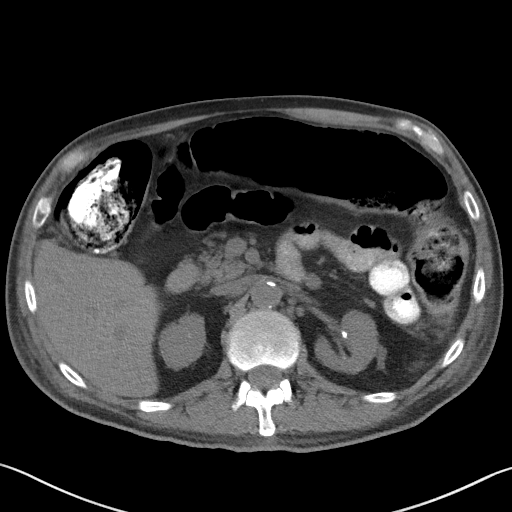
[im 72/104  bone]
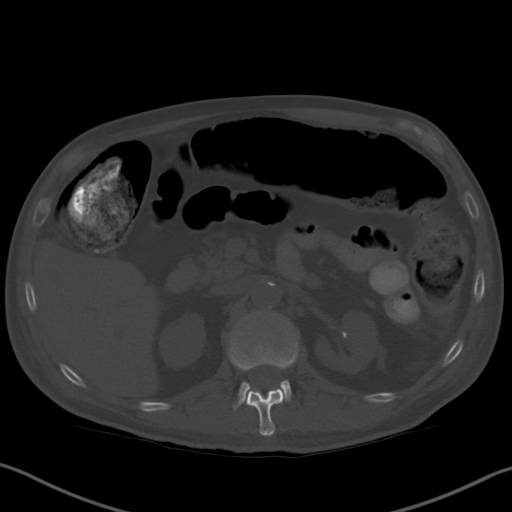
[im 80/104  soft-tissue]
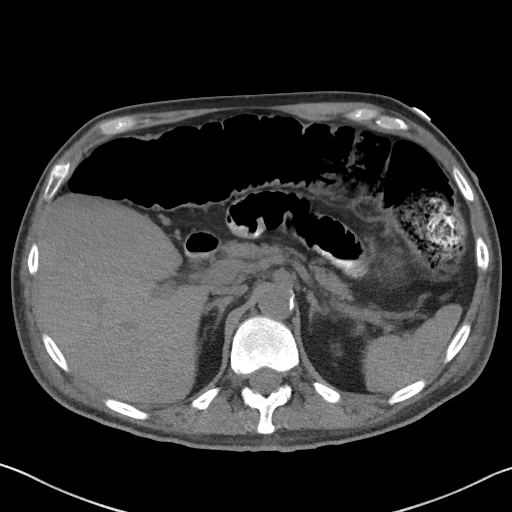
[im 88/104  soft-tissue]
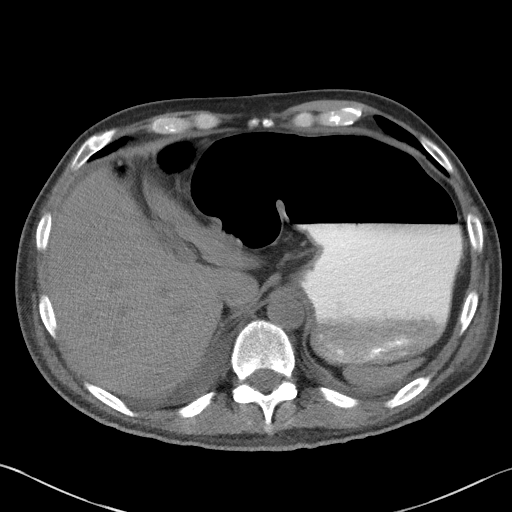
[im 96/104  soft-tissue]
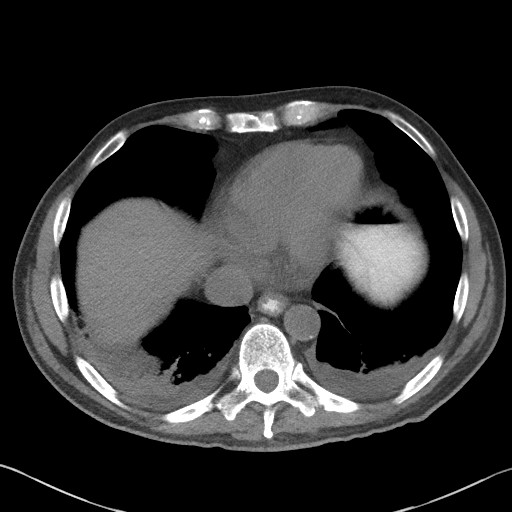

[Series 5: coronal st · coronal · 0.73mm/px · 3 of 96 slices shown]
[im 32/96  soft-tissue]
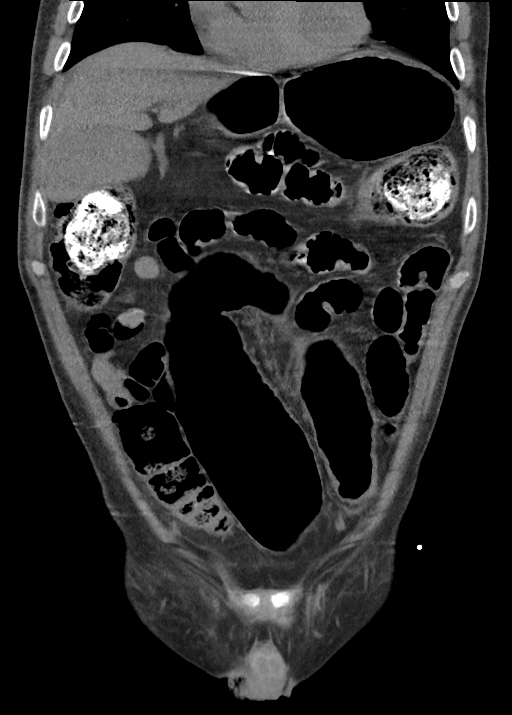
[im 43/96  soft-tissue]
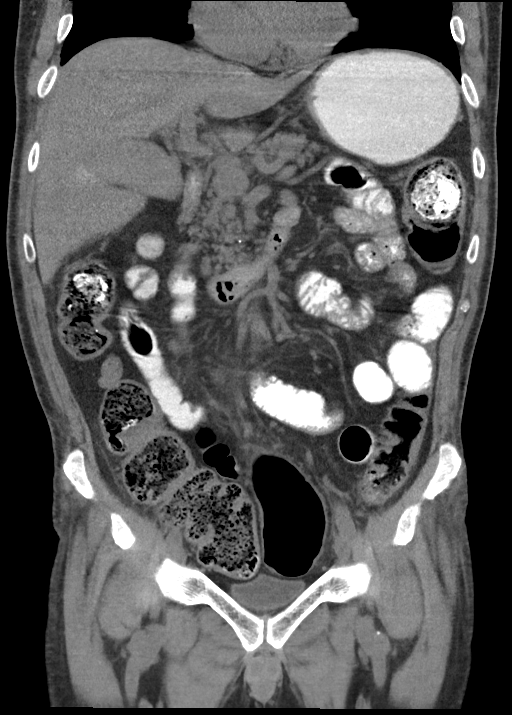
[im 53/96  soft-tissue]
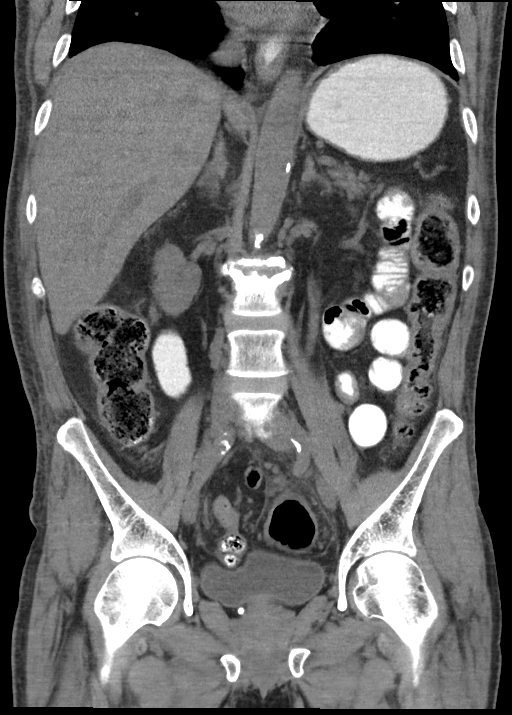

[15 of 46 positions shown; findings below may reference images not displayed]

FINDINGS: Lower chest: Small bilateral pleural effusions. Contrast medium in
the distal esophagus suggesting dysmotility or reflux.

Low-density blood pool suggests anemia.

Hepatobiliary: Dependent gallstones in the gallbladder. Trace
perihepatic ascites.

Pancreas: Punctate calcifications in the pancreas likely reflecting
chronic calcific pancreatitis.

Spleen: Unremarkable

Adrenals/Urinary Tract: There about 8 nonobstructive right renal
calculi measuring up to 0.5 cm in diameter. There are about 13
nonobstructive left renal calculi measuring up to 0.6 cm in
diameter. No hydronephrosis, hydroureter, or ureteral calculus
identified. Adrenal glands unremarkable.

Stomach/Bowel: Rectal wall thickening noted with redundant dilated
sigmoid colon extending up into the upper abdomen, but without overt
volvulus. There is wall thickening in the descending colon.
Gas-filled dilated transverse colon. Mild segmental wall thickening
in the ascending colon. There is edema in the sigmoid colon
mesentery and to a lesser extent in the central bowel mesentery
normal appendix. No pneumatosis.

Vascular/Lymphatic: Aortoiliac atherosclerotic vascular disease.

Reproductive: Mild prostatomegaly.

Other: Presacral edema. Mild scattered ascites. Mesenteric edema
especially along the sigmoid mesentery.

Musculoskeletal: Degenerative disc disease at L2-3 and L5-S1 with
endplate irregularity and endplate sclerosis especially at L2-3.
This has worsened compared to 06/13/2018.
IMPRESSION: 1. Rectal wall thickening with redundant dilated sigmoid colon
extending up into the upper abdomen, but without overt volvulus.
There is also wall thickening in the descending colon, ascending
colon, and sigmoid colon. There is edema in the sigmoid colon
mesentery and to a lesser extent in the central bowel mesentery. The
appearance is nonspecific but could be due to colitis, inflammatory
bowel disease or inflammatory bowel disease; a component of
functional colonic abnormalities not excluded given the moderately
dilated appearance.
2. Small bilateral pleural effusions.
3. Low-density blood pool suggests anemia.
4. Cholelithiasis.
5. Bilateral nonobstructive nephrolithiasis.
6. Mild prostatomegaly.
7. Degenerative disc disease at L2-3 and L5-S1 with endplate
irregularity and endplate sclerosis especially at L2-3. This has
worsened compared to 06/13/2018.
8. Contrast medium in the distal esophagus suggesting dysmotility or
reflux.
9. Aortic atherosclerosis.

Aortic Atherosclerosis ([id number]).

## 2022-02-04 IMAGING — DX DG CHEST 1V PORT
1 series · 1 of 1 positions shown · non-contrast
Comparison: 12/20/2020

CLINICAL DATA: Dyspnea

EXAM:
PORTABLE CHEST 1 VIEW

[chest ap]
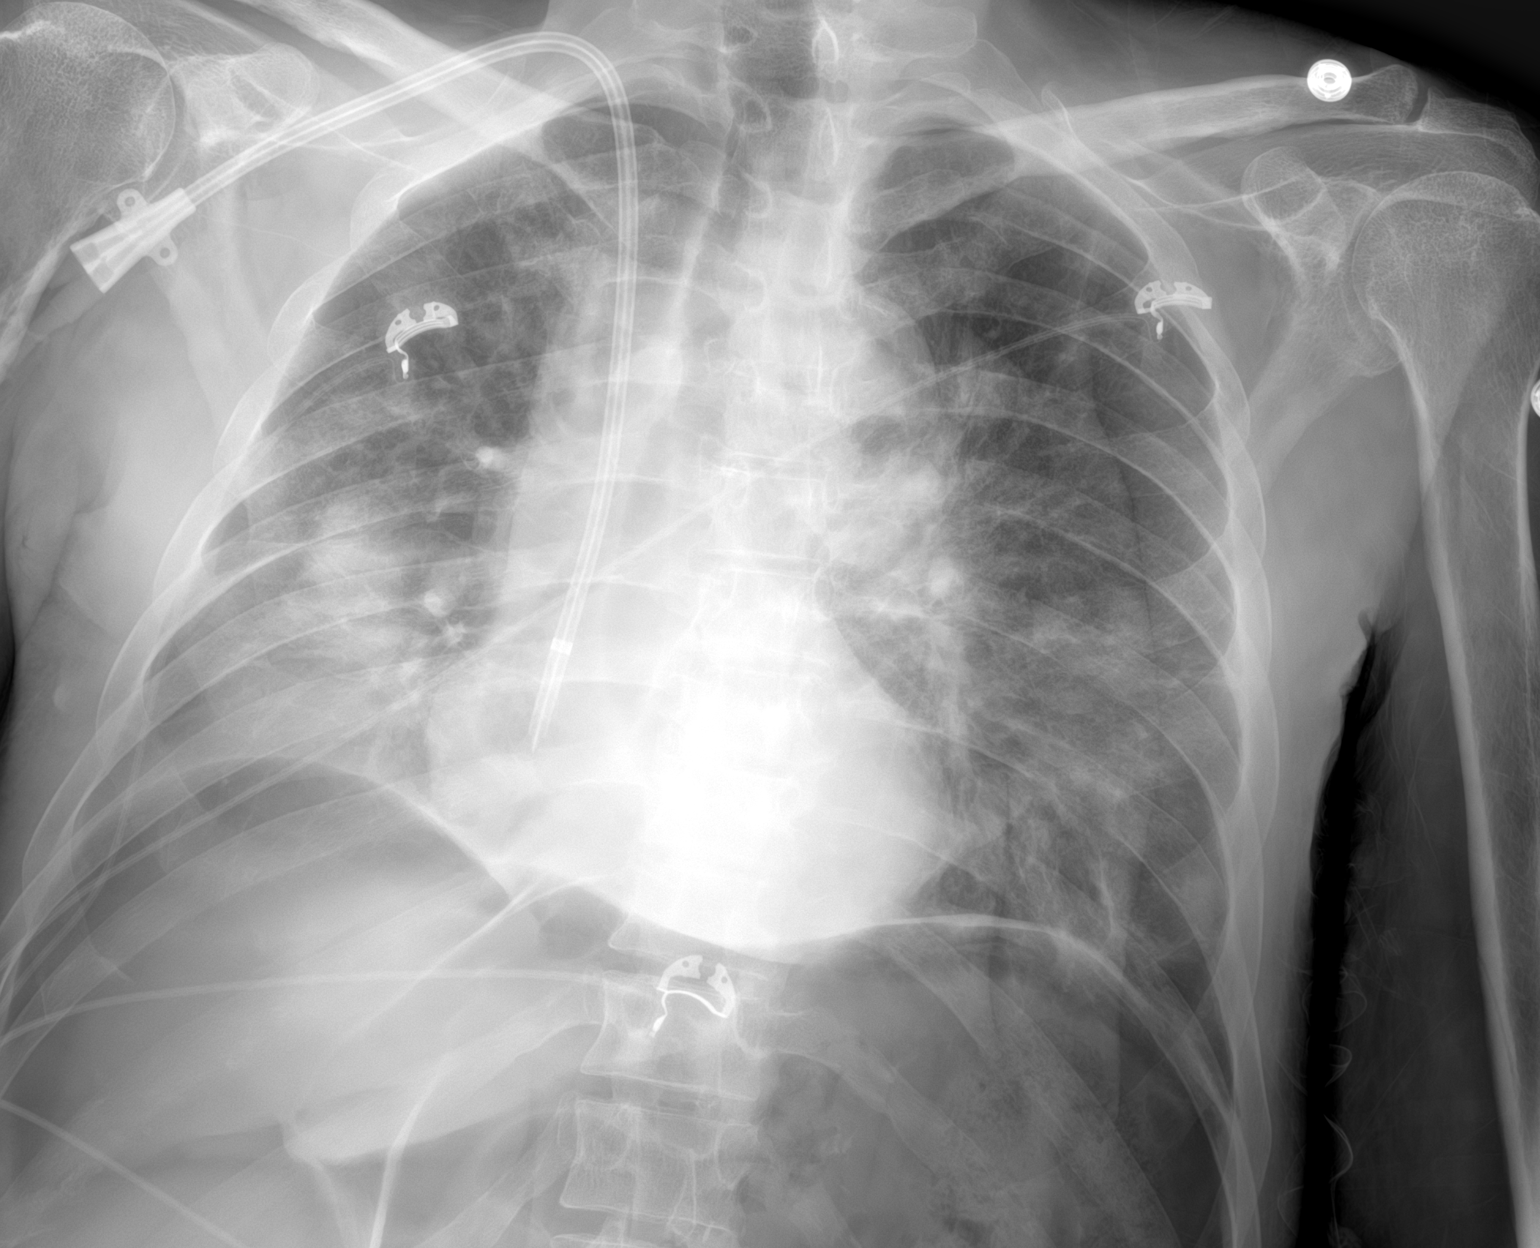

[1 of 1 positions shown; findings below may reference images not displayed]

FINDINGS: Interval extubation. Nasogastric tube is been removed. Pulmonary
insufflation has diminished slightly. There is interval development
of bilateral perihilar and lower lung zone airspace infiltrate,
likely infectious or inflammatory. No pneumothorax or pleural
effusion. Right internal jugular hemodialysis catheter tip noted
within the right atrium. Cardiac size within normal limits. The
pulmonary vascularity is normal. Massive pneumoperitoneum is again
identified below the hemidiaphragm.
IMPRESSION: Interval extubation.  Diminished pulmonary insufflation.

Interval development of bilateral mid and lower lung zone airspace
infiltrate, likely infectious or inflammatory.

Massive pneumoperitoneum again noted.

## 2022-02-06 IMAGING — DX DG CHEST 1V PORT
1 series · 2 of 2 positions shown · non-contrast
Comparison: Abdomen 01/01/2021.  Chest x-ray 01/01/2021.

CLINICAL DATA: Pneumoperitoneum.

EXAM:
PORTABLE CHEST 1 VIEW

[Series 1: chest · 0.14mm/px · 2 of 2 slices shown]
[im 1/2]
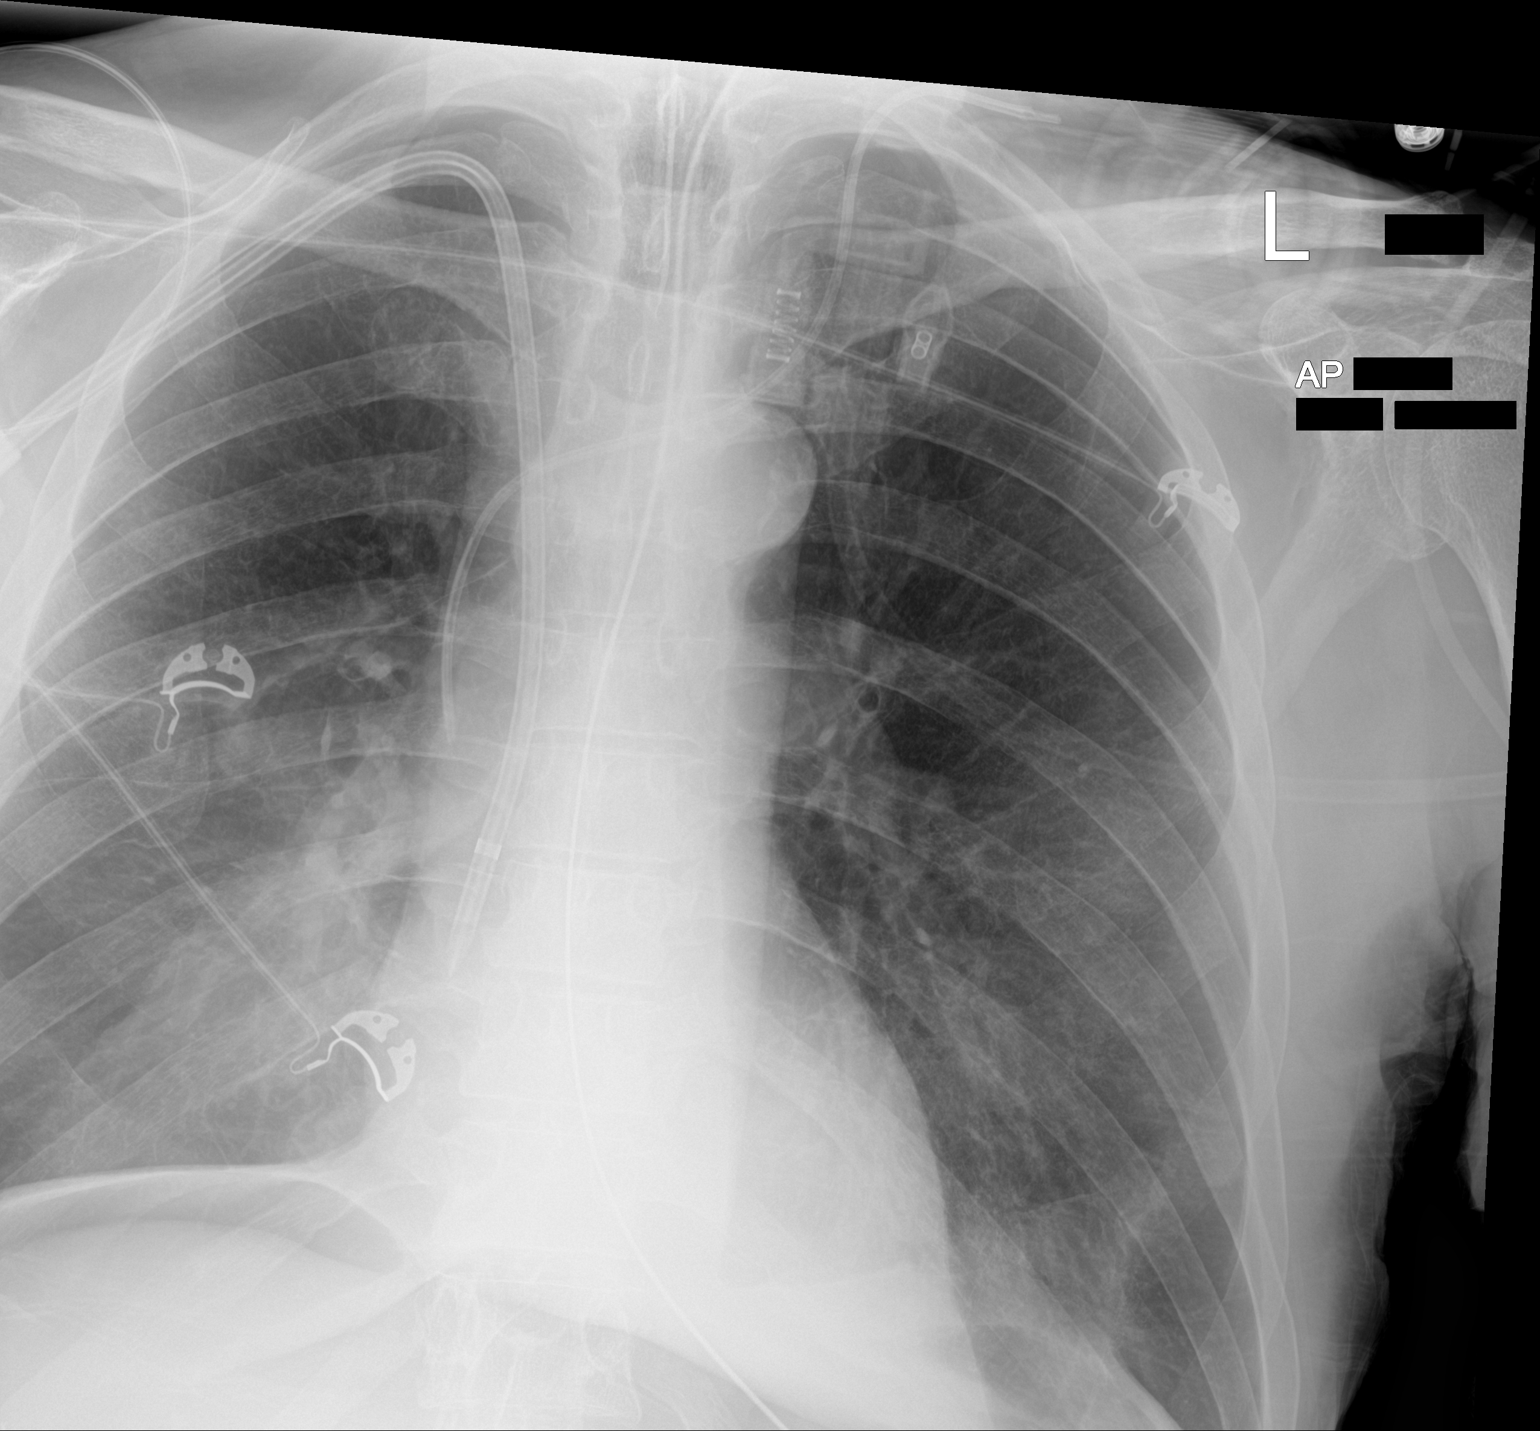
[im 2/2]
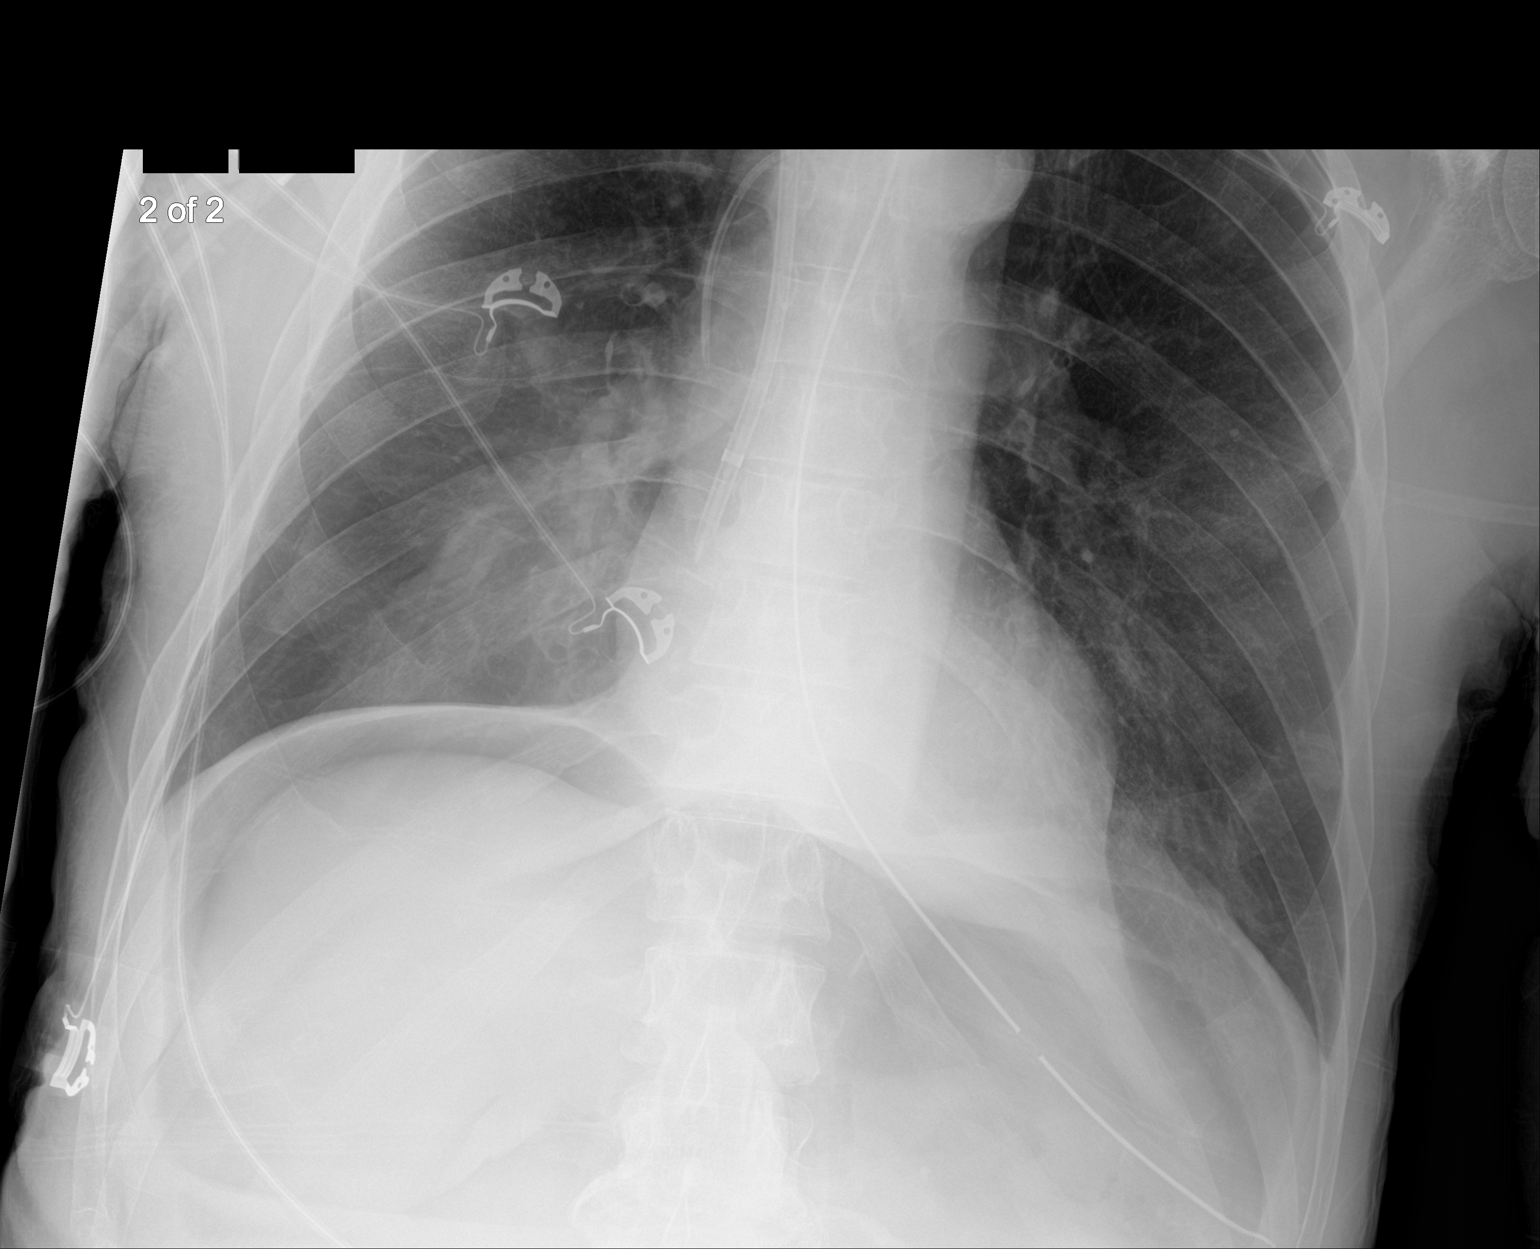

[2 of 2 positions shown; findings below may reference images not displayed]

FINDINGS: Endotracheal tube, NG tube, right dual-lumen central catheter, left
IJ line in stable position. Heart size stable. Persistent bibasilar
infiltrates without interim change. No pleural effusion or
pneumothorax. Prominent pneumonectomy again noted.
IMPRESSION: 1. Lines and tubes in stable position.
2. Persistent bibasilar infiltrates without interim change.
3. Prominent pneumoperitoneum again noted.
